# Patient Record
Sex: Male | Born: 1965 | Race: Black or African American | Hispanic: No | Marital: Married | Smoking: Former smoker
Health system: Southern US, Community
[De-identification: ages and names within clinical notes are randomized; demographics above are authoritative.]

## PROBLEM LIST (undated history)

## (undated) DIAGNOSIS — I10 Essential (primary) hypertension: Secondary | ICD-10-CM

## (undated) DIAGNOSIS — I639 Cerebral infarction, unspecified: Secondary | ICD-10-CM

## (undated) DIAGNOSIS — E119 Type 2 diabetes mellitus without complications: Secondary | ICD-10-CM

## (undated) DIAGNOSIS — G4730 Sleep apnea, unspecified: Secondary | ICD-10-CM

## (undated) DIAGNOSIS — J40 Bronchitis, not specified as acute or chronic: Secondary | ICD-10-CM

## (undated) DIAGNOSIS — L0390 Cellulitis, unspecified: Secondary | ICD-10-CM

## (undated) DIAGNOSIS — J449 Chronic obstructive pulmonary disease, unspecified: Secondary | ICD-10-CM

## (undated) HISTORY — PX: OTHER SURGICAL HISTORY: SHX169

---

## 2001-02-07 ENCOUNTER — Emergency Department (HOSPITAL_COMMUNITY): Admission: EM | Admit: 2001-02-07 | Discharge: 2001-02-07 | Payer: Self-pay | Admitting: *Deleted

## 2002-05-26 ENCOUNTER — Emergency Department (HOSPITAL_COMMUNITY): Admission: EM | Admit: 2002-05-26 | Discharge: 2002-05-26 | Payer: Self-pay | Admitting: Emergency Medicine

## 2004-04-20 ENCOUNTER — Emergency Department (HOSPITAL_COMMUNITY): Admission: EM | Admit: 2004-04-20 | Discharge: 2004-04-20 | Payer: Self-pay | Admitting: Emergency Medicine

## 2004-04-20 IMAGING — CR DG CHEST 2V
2 series · 2 of 2 positions shown · non-contrast
Comparison: none

HISTORY: Aching chest pain cough fever

CHEST 2 VIEWS:
No prior study for comparison
Normal heart size, mediastinal contours, and vascularity.
Bronchitic changes with minimal subsegmental atelectasis lower left chest.
No infiltrate, effusion, or pneumothorax.
Bones unremarkable.

[view not recorded (1 of 2)]
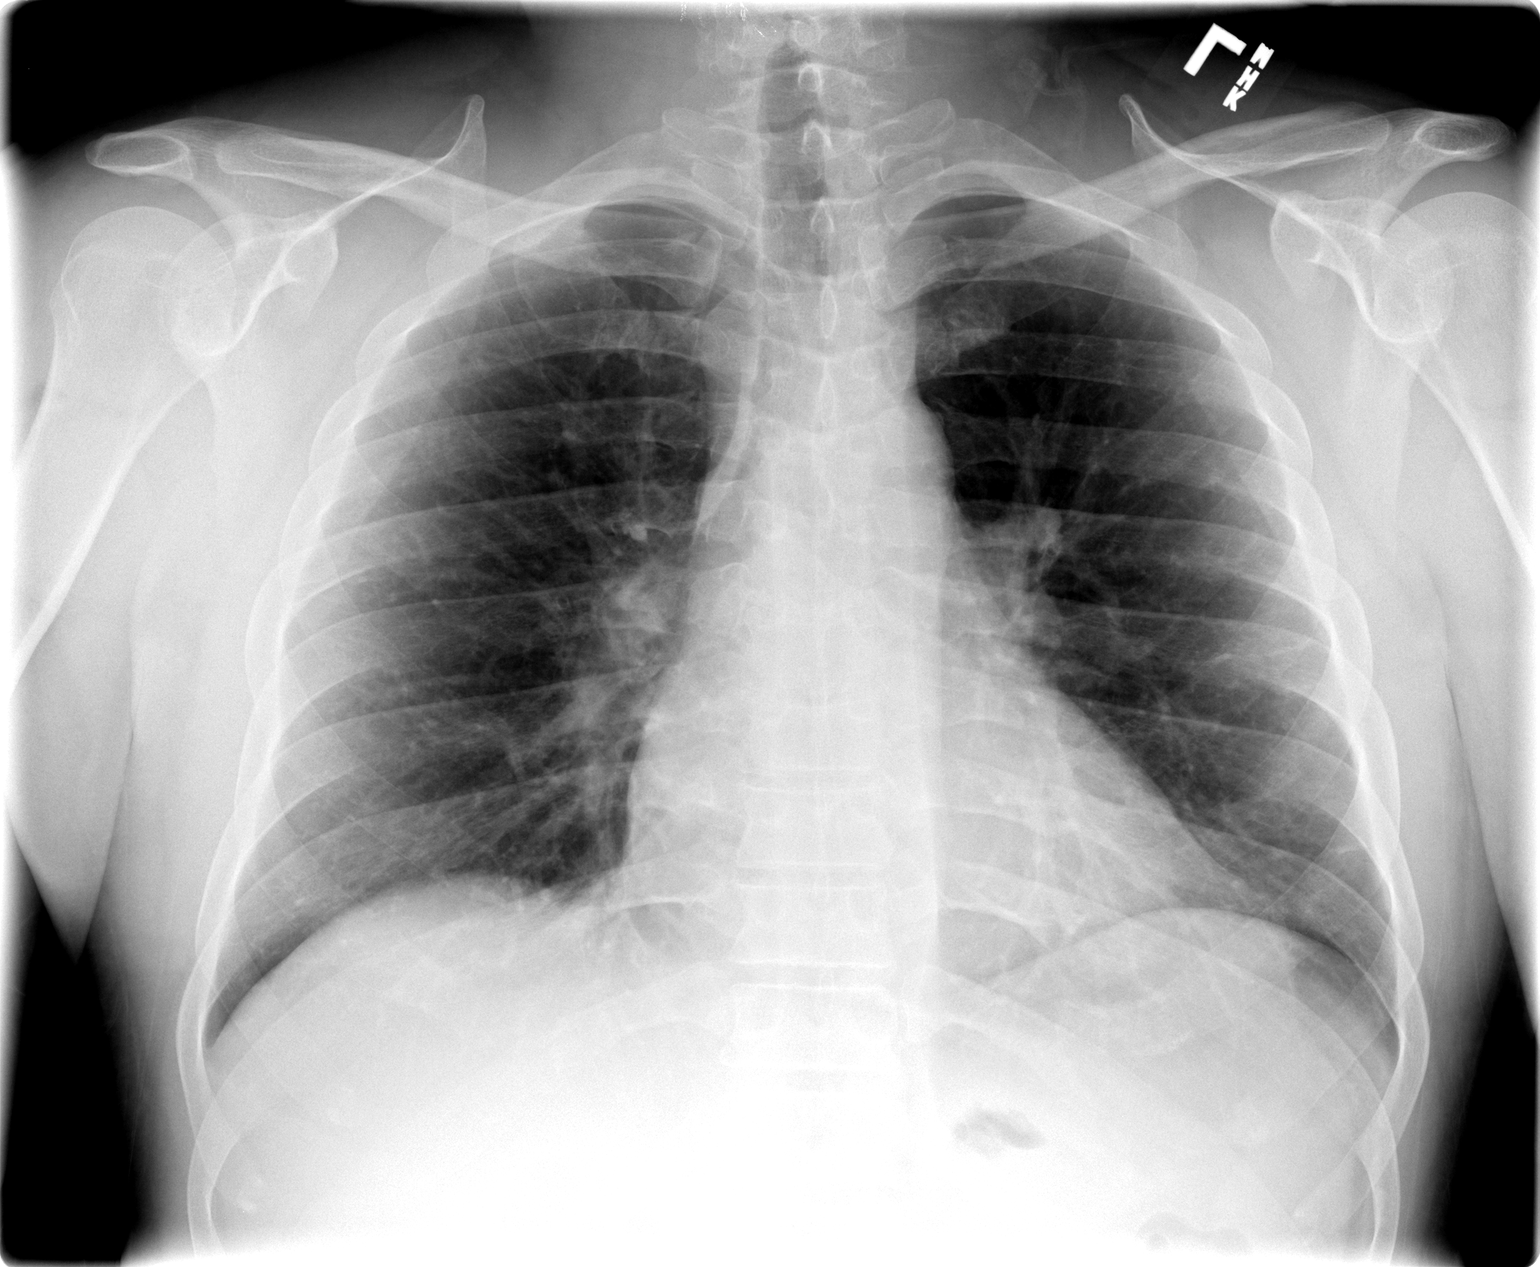

[view not recorded (2 of 2)]
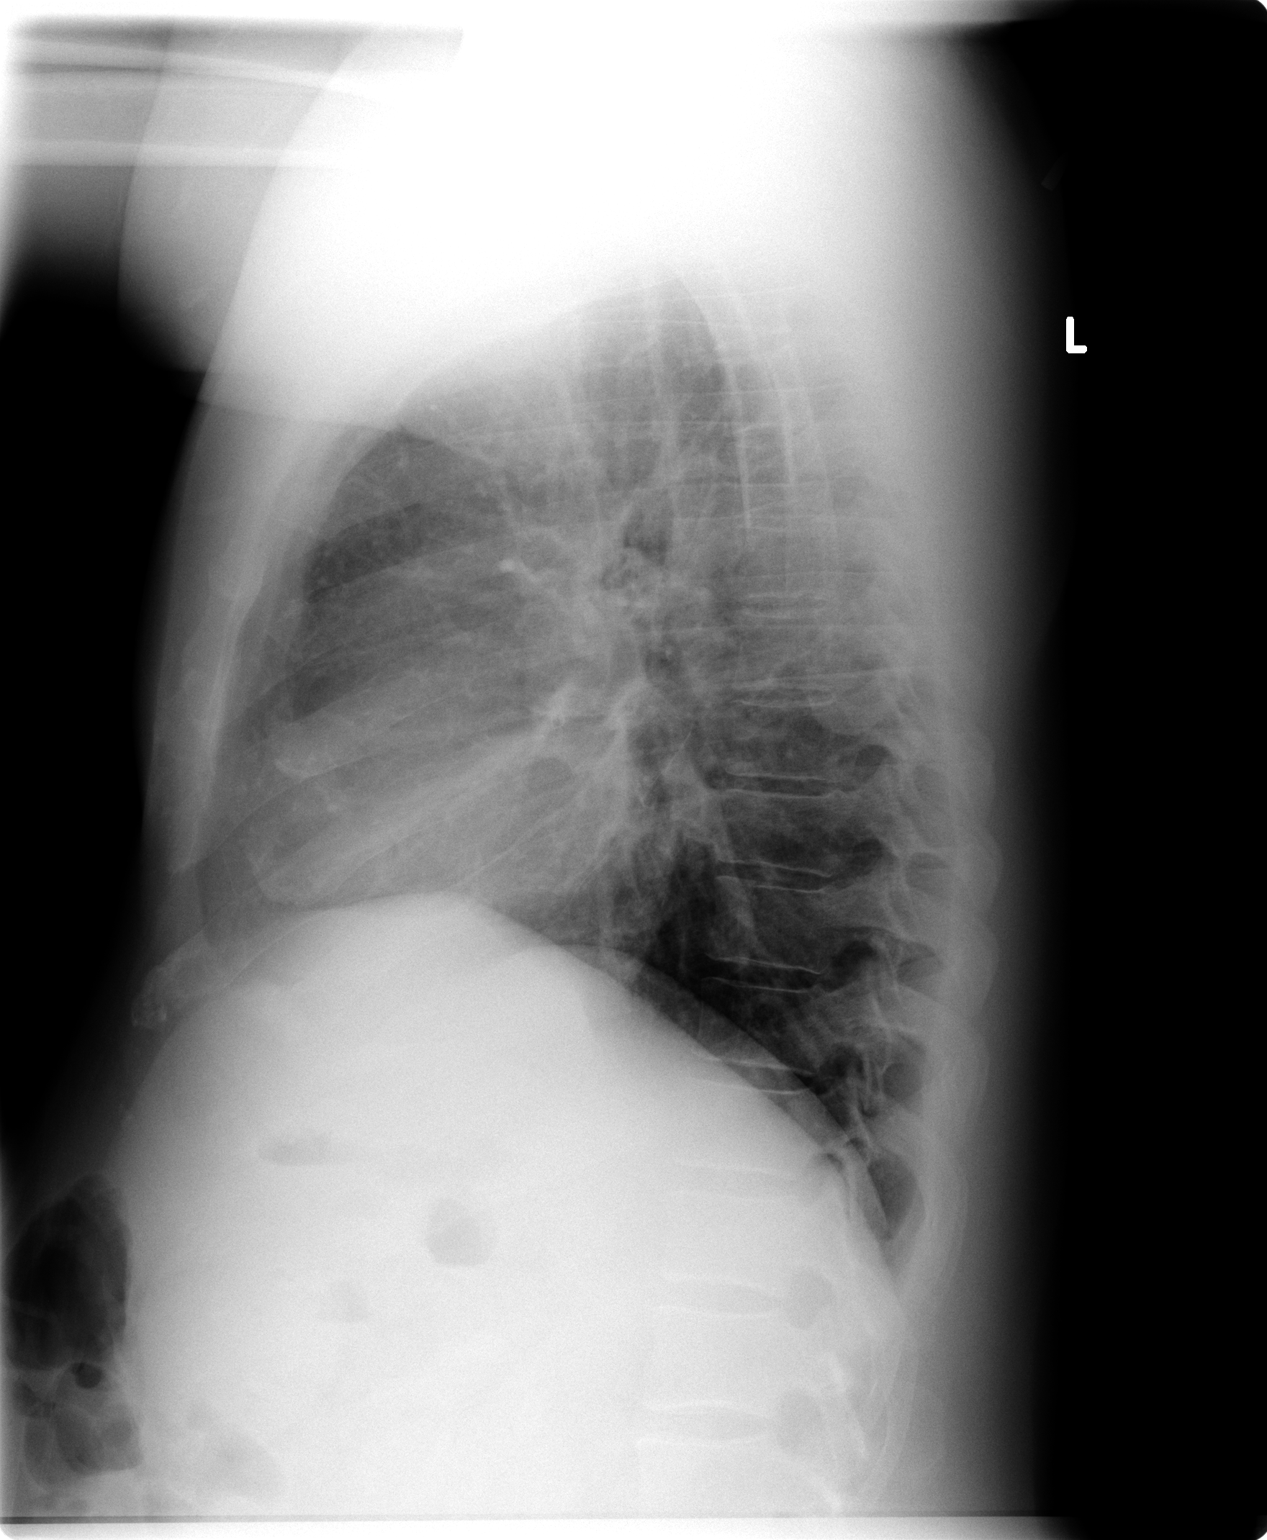

[2 of 2 positions shown; findings below may reference images not displayed]

IMPRESSION: Minimal bronchitic changes.
Minimal subsegmental atelectasis lower left chest.

## 2004-08-10 ENCOUNTER — Emergency Department (HOSPITAL_COMMUNITY): Admission: EM | Admit: 2004-08-10 | Discharge: 2004-08-10 | Payer: Self-pay | Admitting: Emergency Medicine

## 2004-11-18 ENCOUNTER — Emergency Department (HOSPITAL_COMMUNITY): Admission: EM | Admit: 2004-11-18 | Discharge: 2004-11-18 | Payer: Self-pay | Admitting: Emergency Medicine

## 2005-02-23 ENCOUNTER — Emergency Department (HOSPITAL_COMMUNITY): Admission: EM | Admit: 2005-02-23 | Discharge: 2005-02-23 | Payer: Self-pay | Admitting: Emergency Medicine

## 2008-03-30 ENCOUNTER — Emergency Department (HOSPITAL_COMMUNITY): Admission: EM | Admit: 2008-03-30 | Discharge: 2008-03-30 | Payer: Self-pay | Admitting: Emergency Medicine

## 2008-09-18 ENCOUNTER — Emergency Department (HOSPITAL_COMMUNITY): Admission: EM | Admit: 2008-09-18 | Discharge: 2008-09-18 | Payer: Self-pay | Admitting: Emergency Medicine

## 2008-09-18 IMAGING — CR DG ANKLE COMPLETE 3+V*R*
3 series · 3 of 3 positions shown · non-contrast
Comparison: None.

CLINICAL DATA: Right ankle laceration, trauma, pain .

RIGHT ANKLE - COMPLETE 3+ VIEW

[t ankle joint ap right]
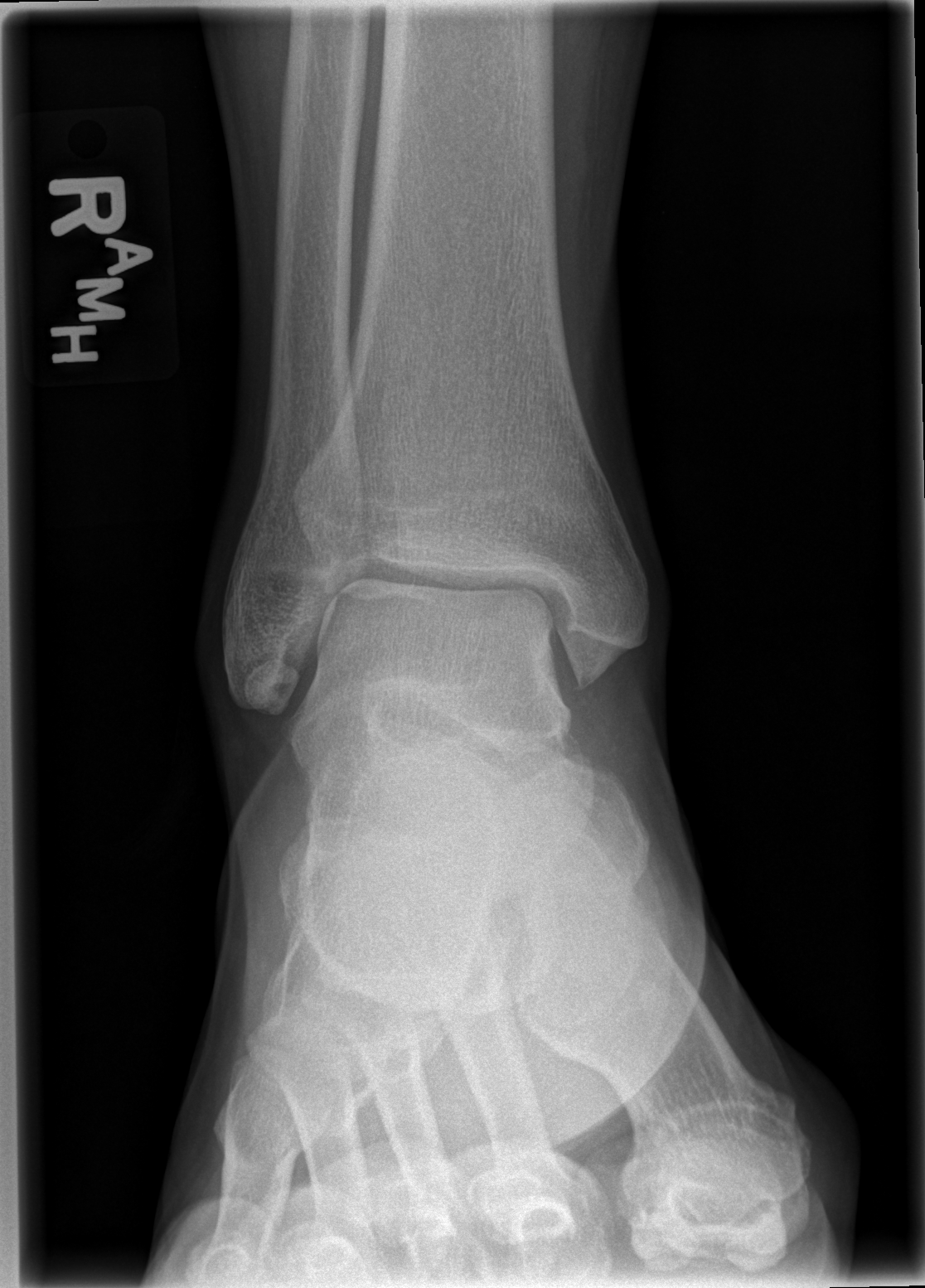

[t ankle joint oblique right]
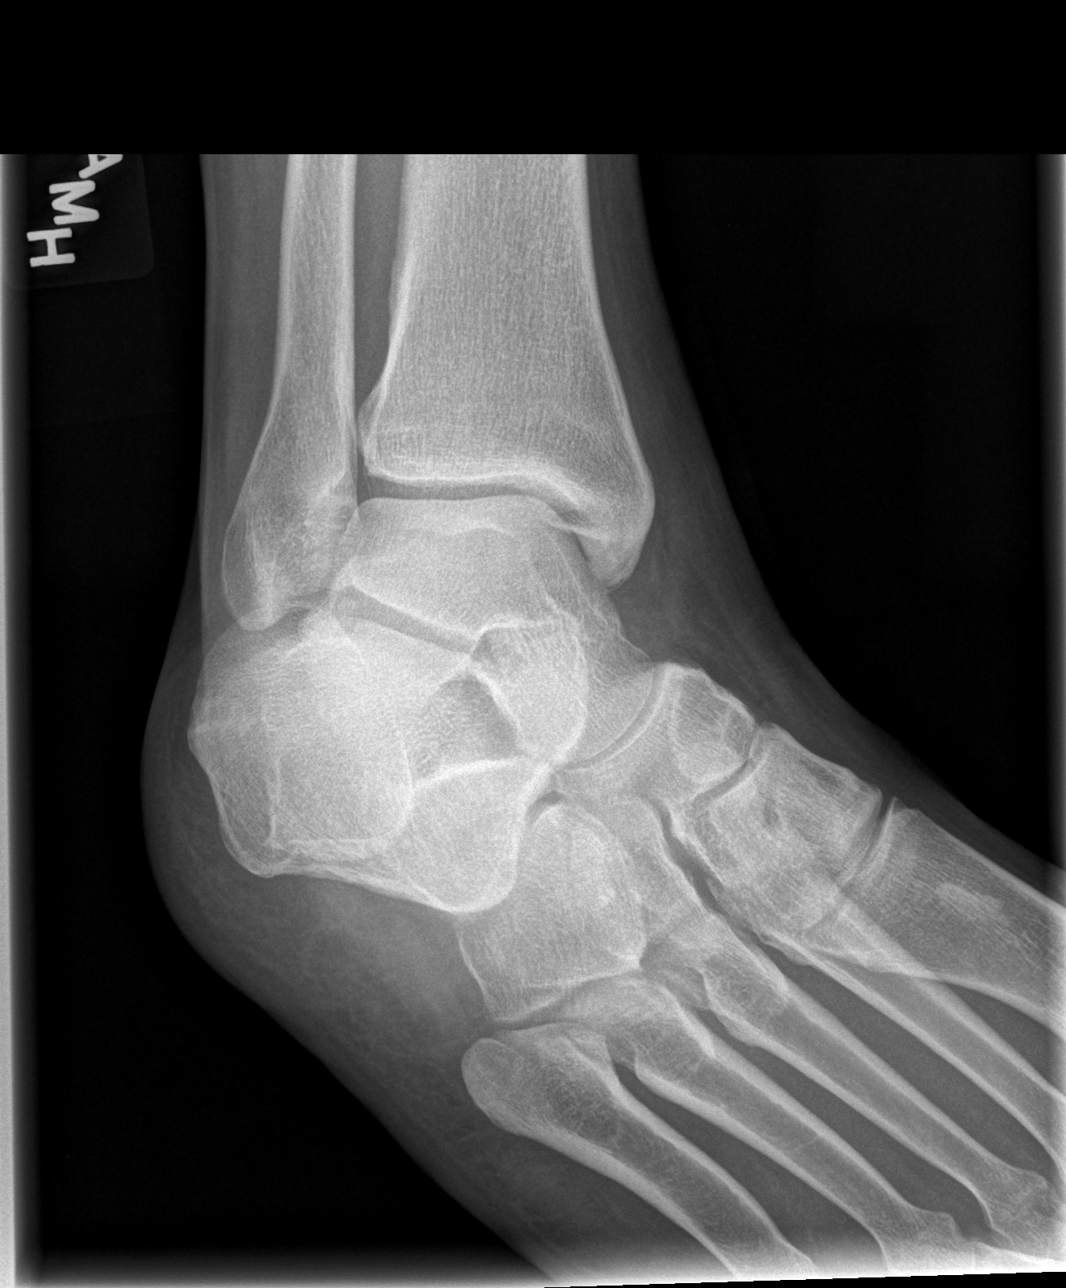

[t ankle joint lat right]
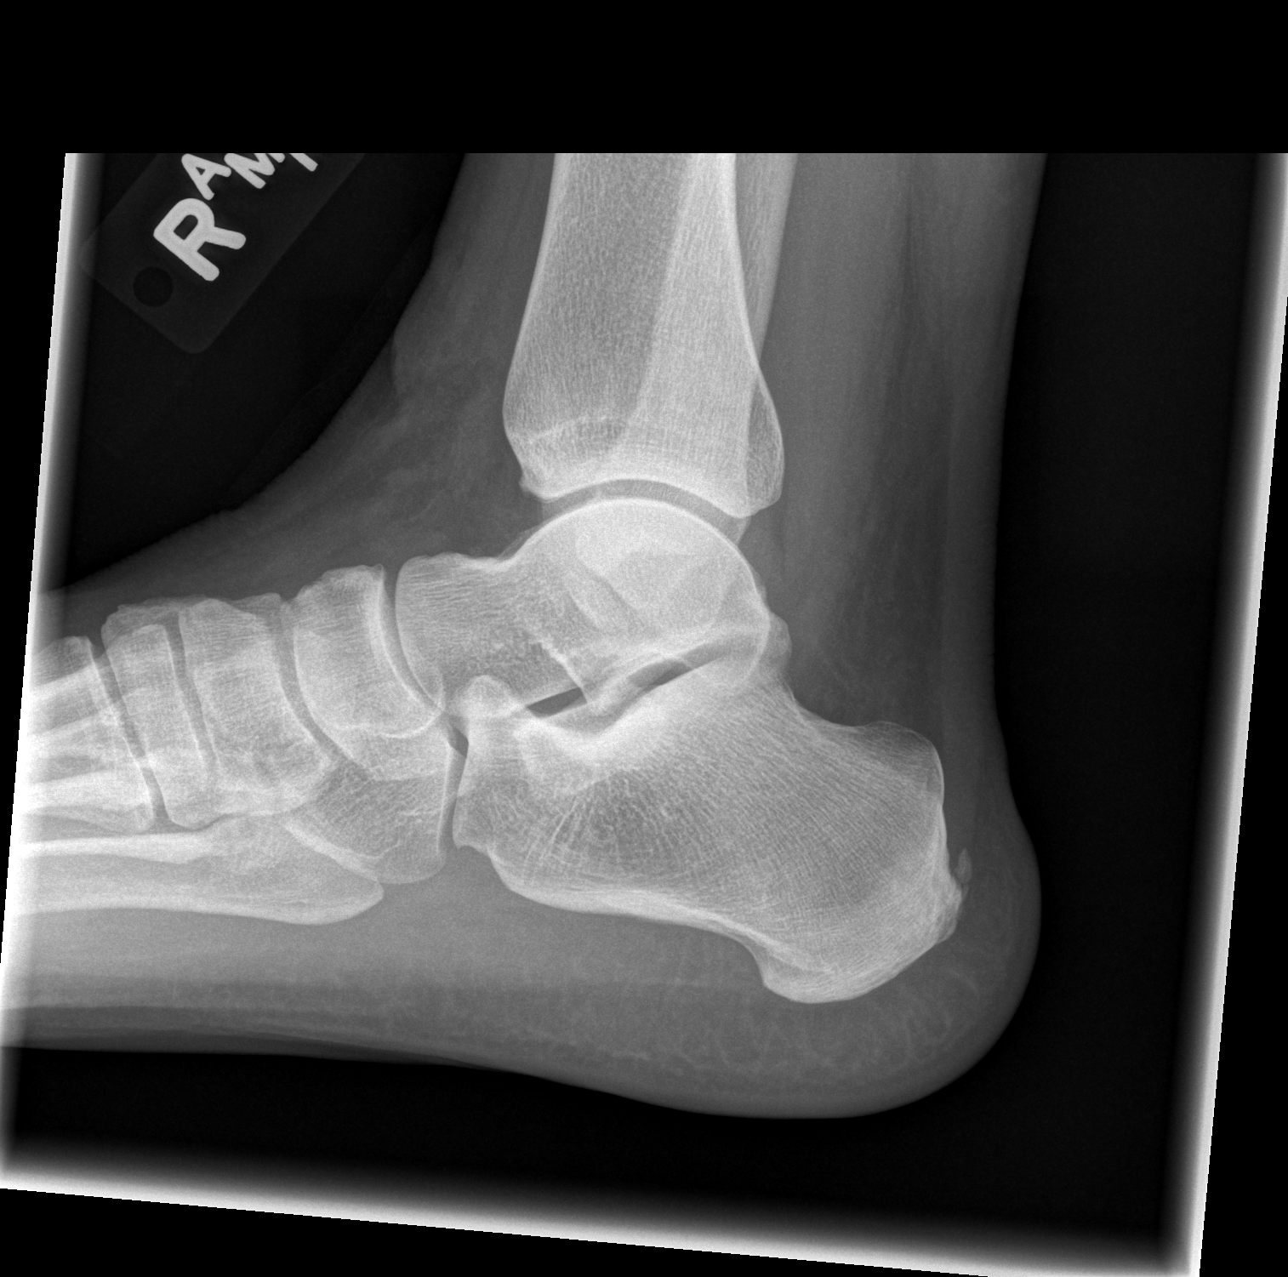

[3 of 3 positions shown; findings below may reference images not displayed]

FINDINGS: Normal alignment.  No acute fracture or definite
effusion.  Secondary ossicle noted along the lateral malleolus.
Intact malleoli, talus and calcaneus
IMPRESSION: No acute finding.

## 2008-12-06 ENCOUNTER — Emergency Department (HOSPITAL_COMMUNITY): Admission: EM | Admit: 2008-12-06 | Discharge: 2008-12-06 | Payer: Self-pay | Admitting: Emergency Medicine

## 2009-04-26 ENCOUNTER — Emergency Department (HOSPITAL_COMMUNITY): Admission: EM | Admit: 2009-04-26 | Discharge: 2009-04-26 | Payer: Self-pay | Admitting: Emergency Medicine

## 2009-04-26 IMAGING — CT CT HEAD W/O CM
2 of 3 series · 15 of 30 positions shown, 19 images · non-contrast
Comparison: None.
COMPARISON: None.

CLINICAL DATA: Headache.  Bilateral ear pain.

CT HEAD WITHOUT CONTRAST
TECHNIQUE: Contiguous axial images were obtained from the base of
the skull through the vertex without intravenous contrast.
CLINICAL DATA: Bilateral ear pain.  Headache.
CT LIMITED SINUSES WITHOUT CONTRAST
TECHNIQUE: Multidetector CT images of the paranasal sinuses were
obtained in a single plane without contrast.

[Series 2: headseq 4.8 h37s · axial · 0.46mm/px · z∈[+167,+324]mm · 14 of 36 slices shown, 18 images]
[im 3/36  brain]
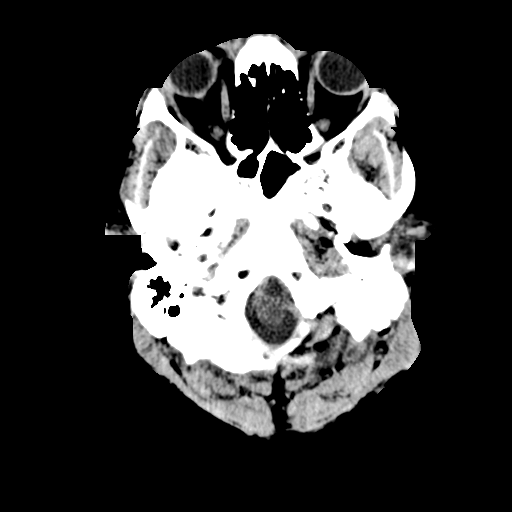
[im 3/36  bone]
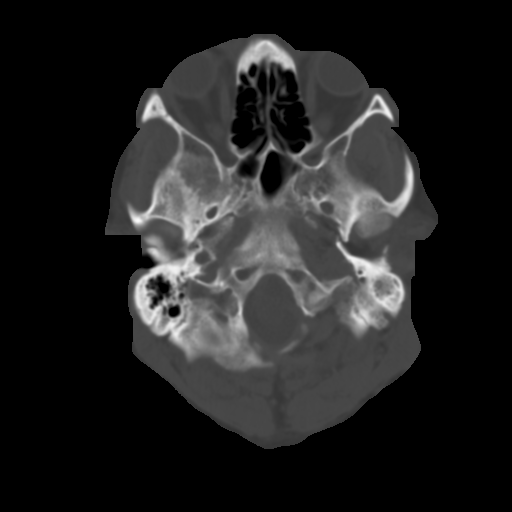
[im 5/36  brain]
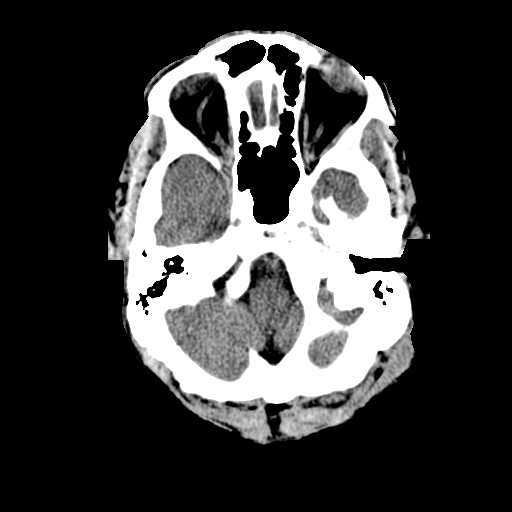
[im 8/36  brain]
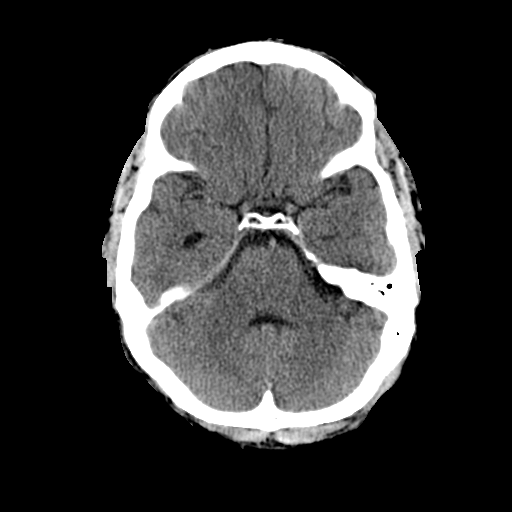
[im 10/36  brain]
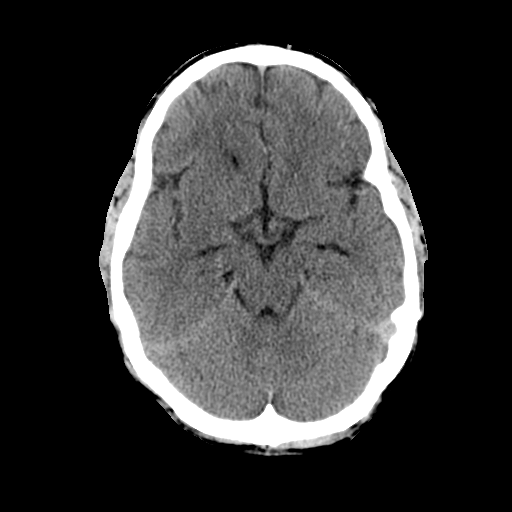
[im 12/36  brain]
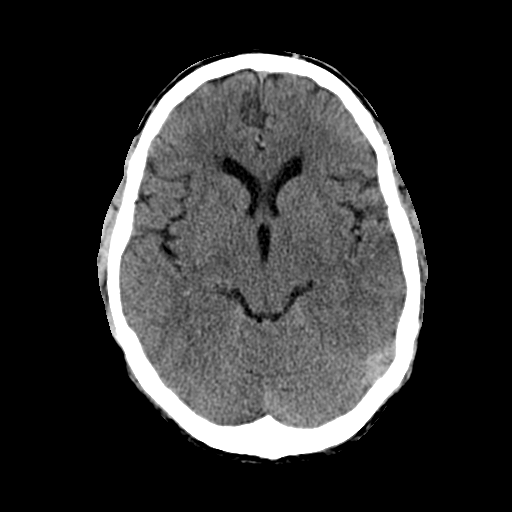
[im 12/36  bone]
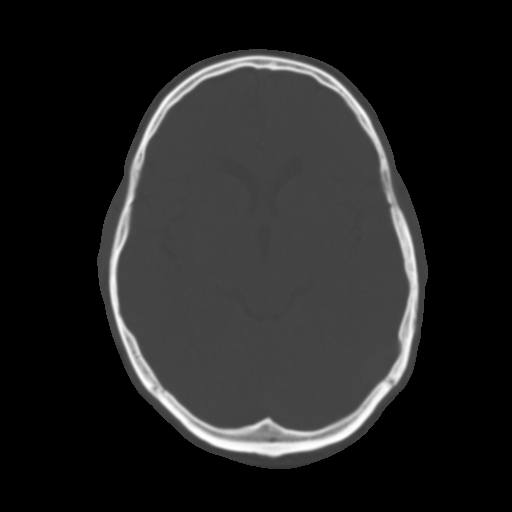
[im 15/36  brain]
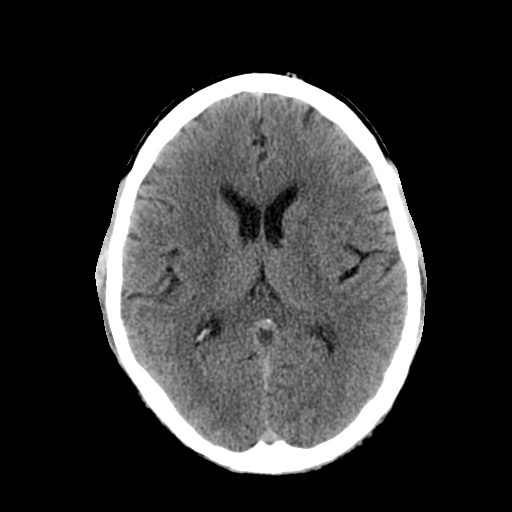
[im 17/36  brain]
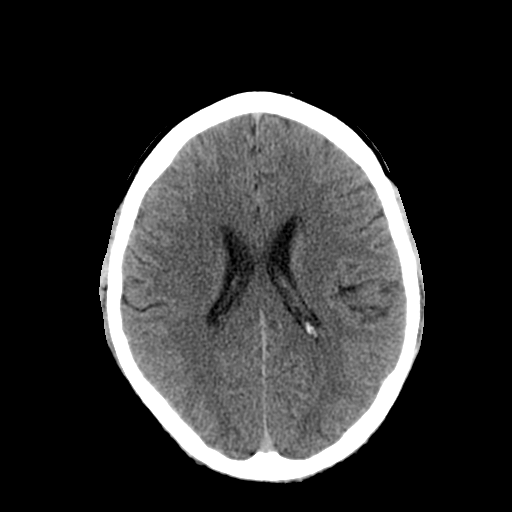
[im 19/36  brain]
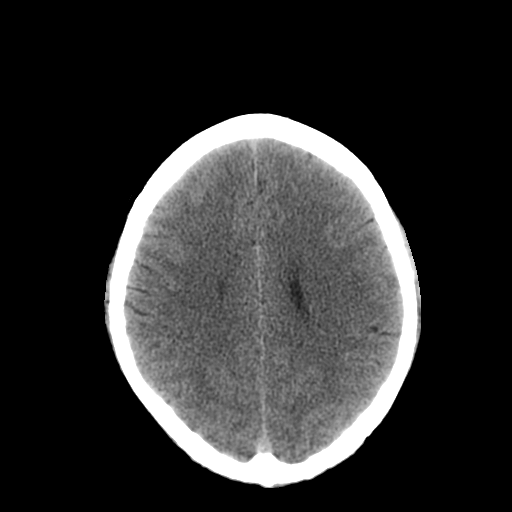
[im 22/36  brain]
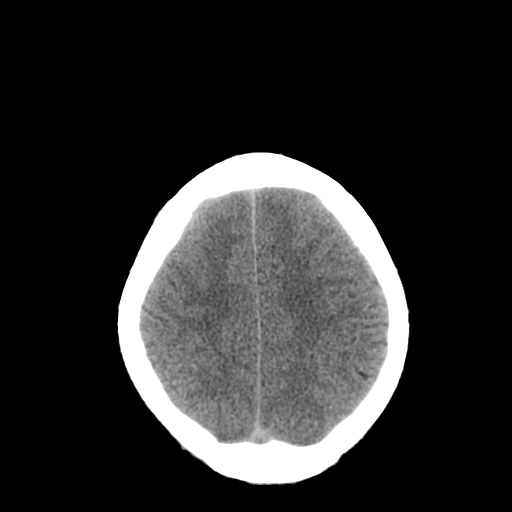
[im 22/36  bone]
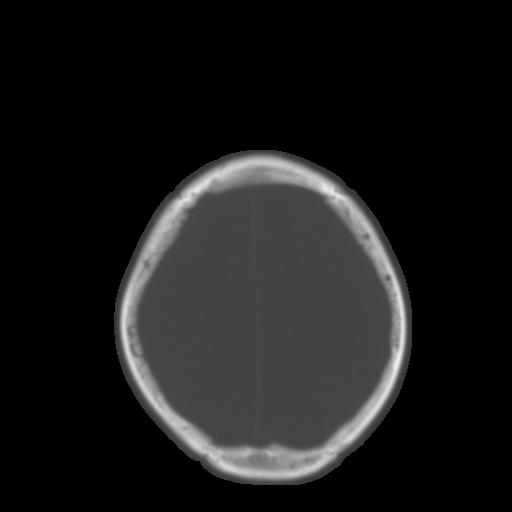
[im 24/36  brain]
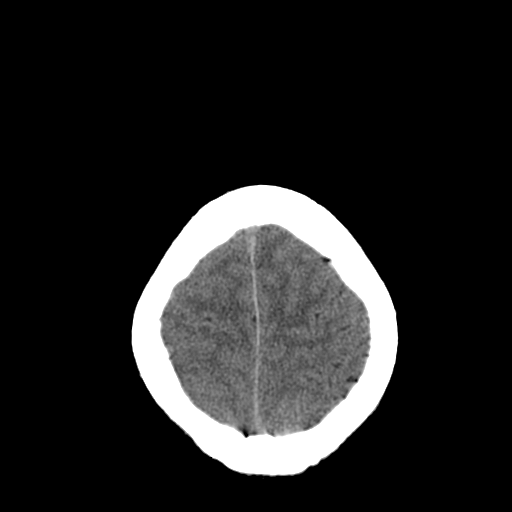
[im 26/36  brain]
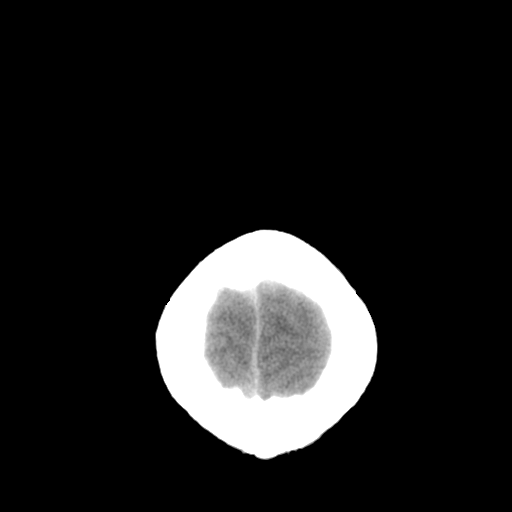
[im 29/36  brain]
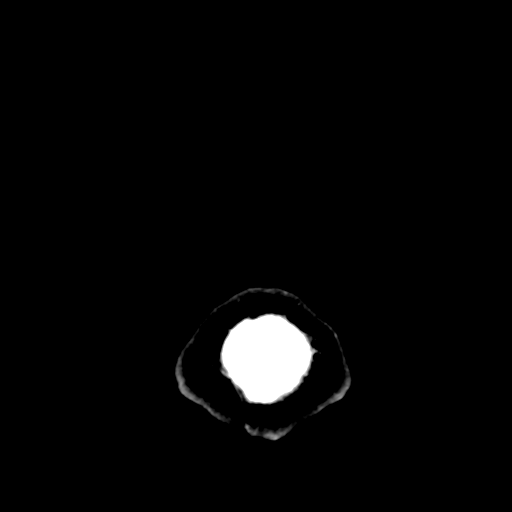
[im 31/36  brain]
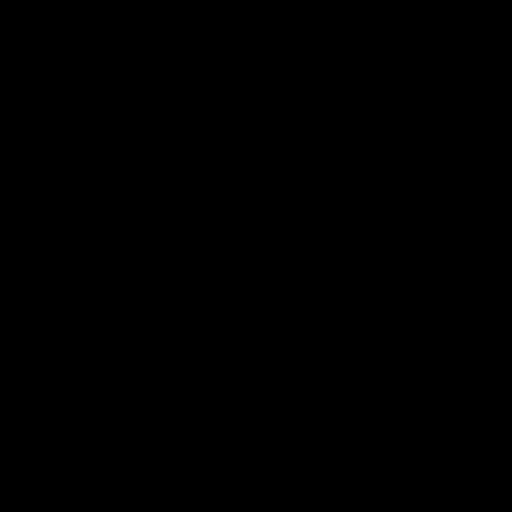
[im 31/36  bone]
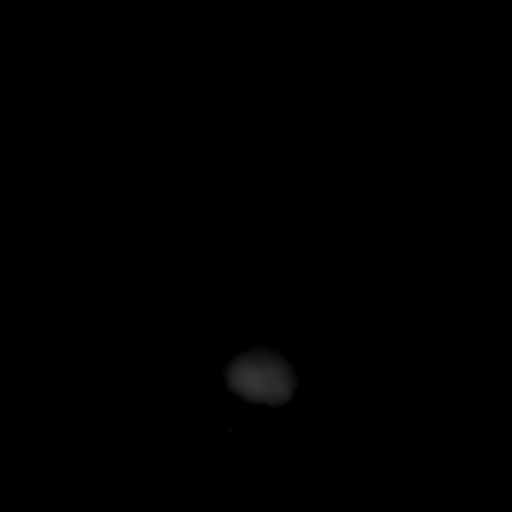
[im 33/36  brain]
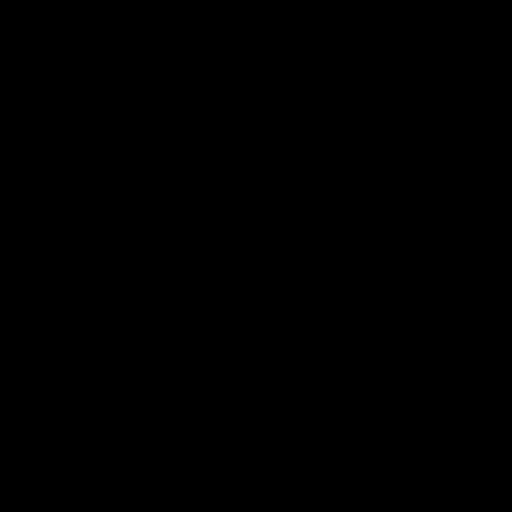

[Series 7: sinusprone 5.0 h70h · axial · 0.26mm/px · 1 of 14 slices shown]
[im 3/14  brain]
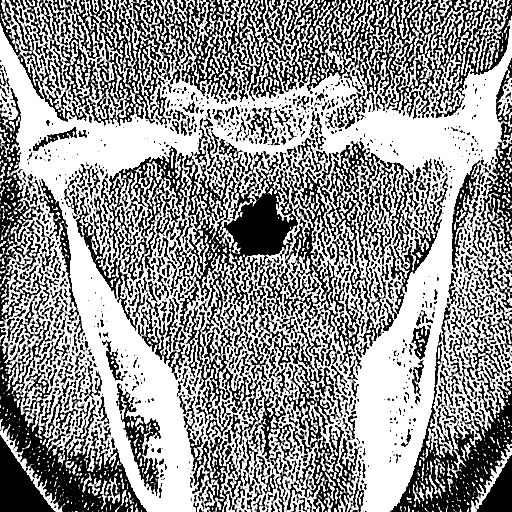

[15 of 30 positions shown; findings below may reference images not displayed]

FINDINGS: The brain appears normal without evidence of acute
infarction, hemorrhage, mass lesion, mass effect, midline shift or
abnormal extra-axial fluid collection.  No hydrocephalus.
Calvarium intact.
IMPRESSION: Negative head CT.
FINDINGS: The paranasal sinuses are clear.  Imaged osseous
structures appear normal.  Visualized soft tissue structures also
appear normal.
IMPRESSION: Negative exam.

## 2010-01-29 ENCOUNTER — Emergency Department (HOSPITAL_COMMUNITY)
Admission: EM | Admit: 2010-01-29 | Discharge: 2010-01-29 | Payer: Self-pay | Source: Home / Self Care | Admitting: Emergency Medicine

## 2010-03-07 ENCOUNTER — Emergency Department (HOSPITAL_COMMUNITY)
Admission: EM | Admit: 2010-03-07 | Discharge: 2010-03-07 | Payer: Self-pay | Source: Home / Self Care | Admitting: Emergency Medicine

## 2010-03-07 IMAGING — CR DG CHEST 2V
2 series · 2 of 2 positions shown · non-contrast
Comparison: [DATE]

CLINICAL DATA: Cough/short of breath

CHEST - 2 VIEW

[view not recorded (1 of 2)]
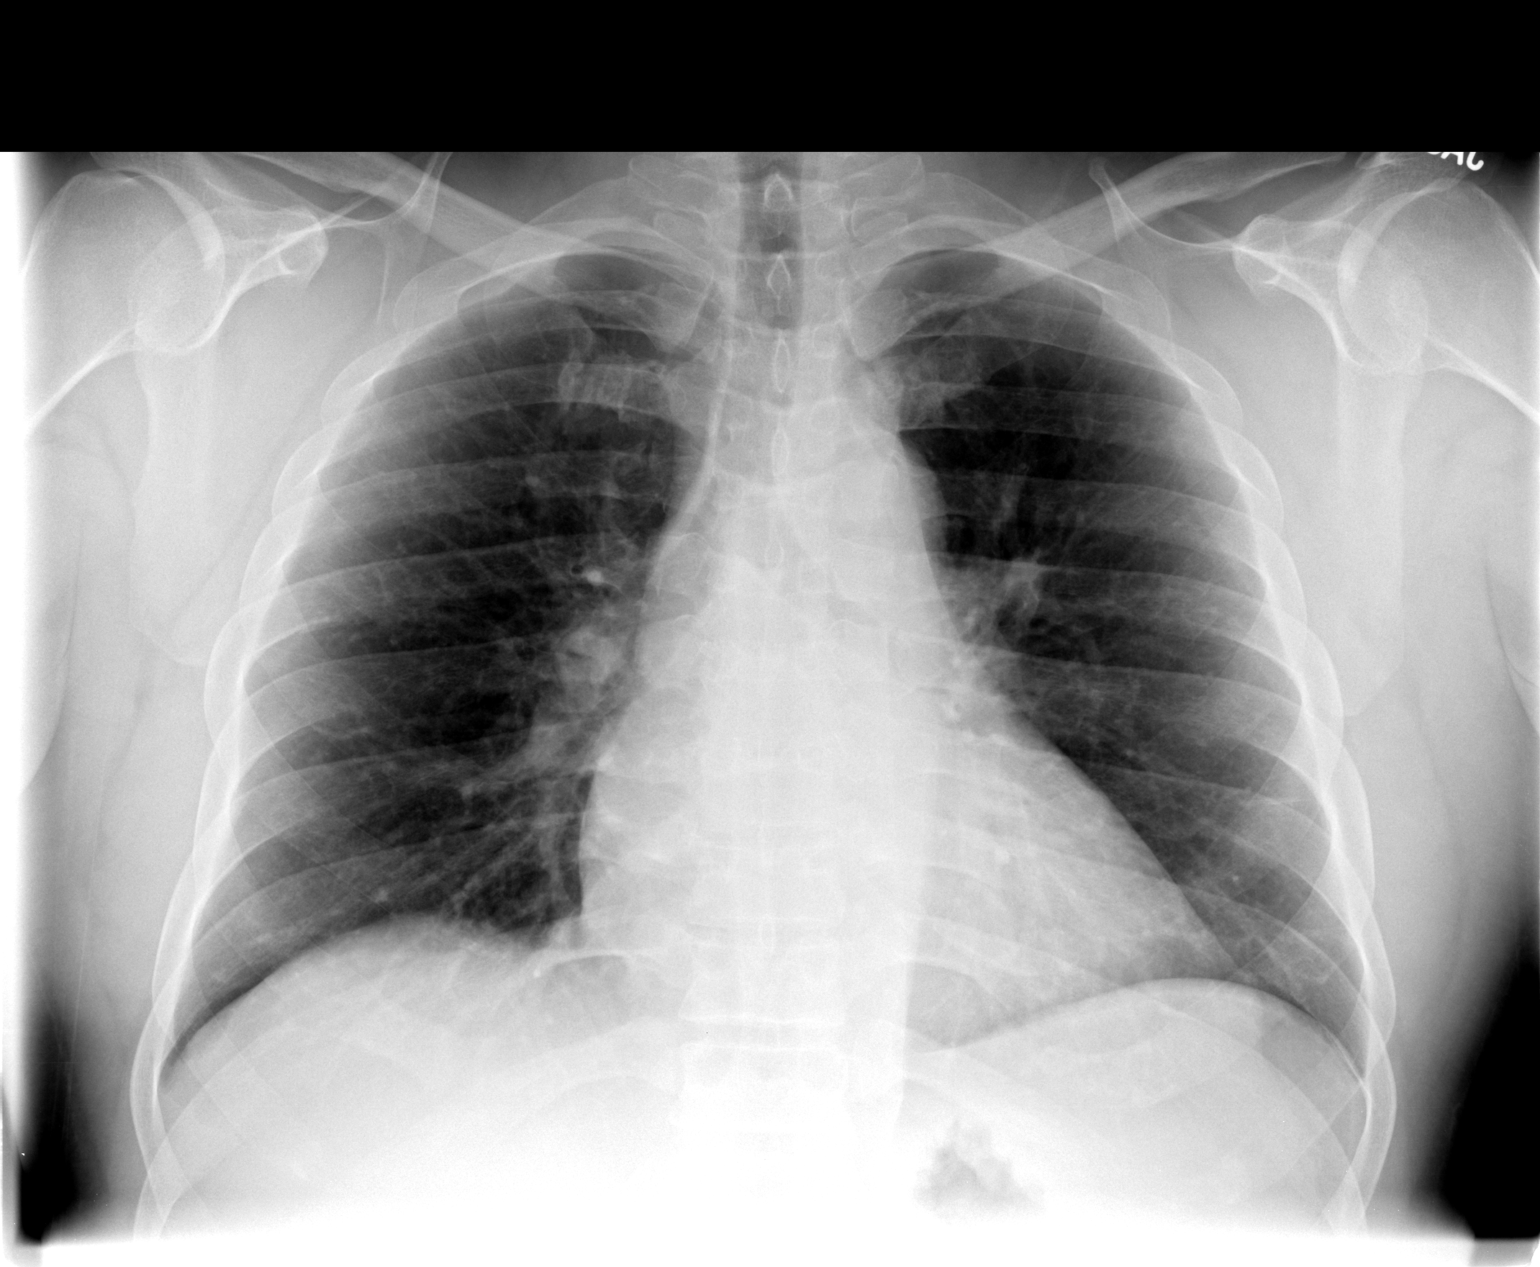

[view not recorded (2 of 2)]
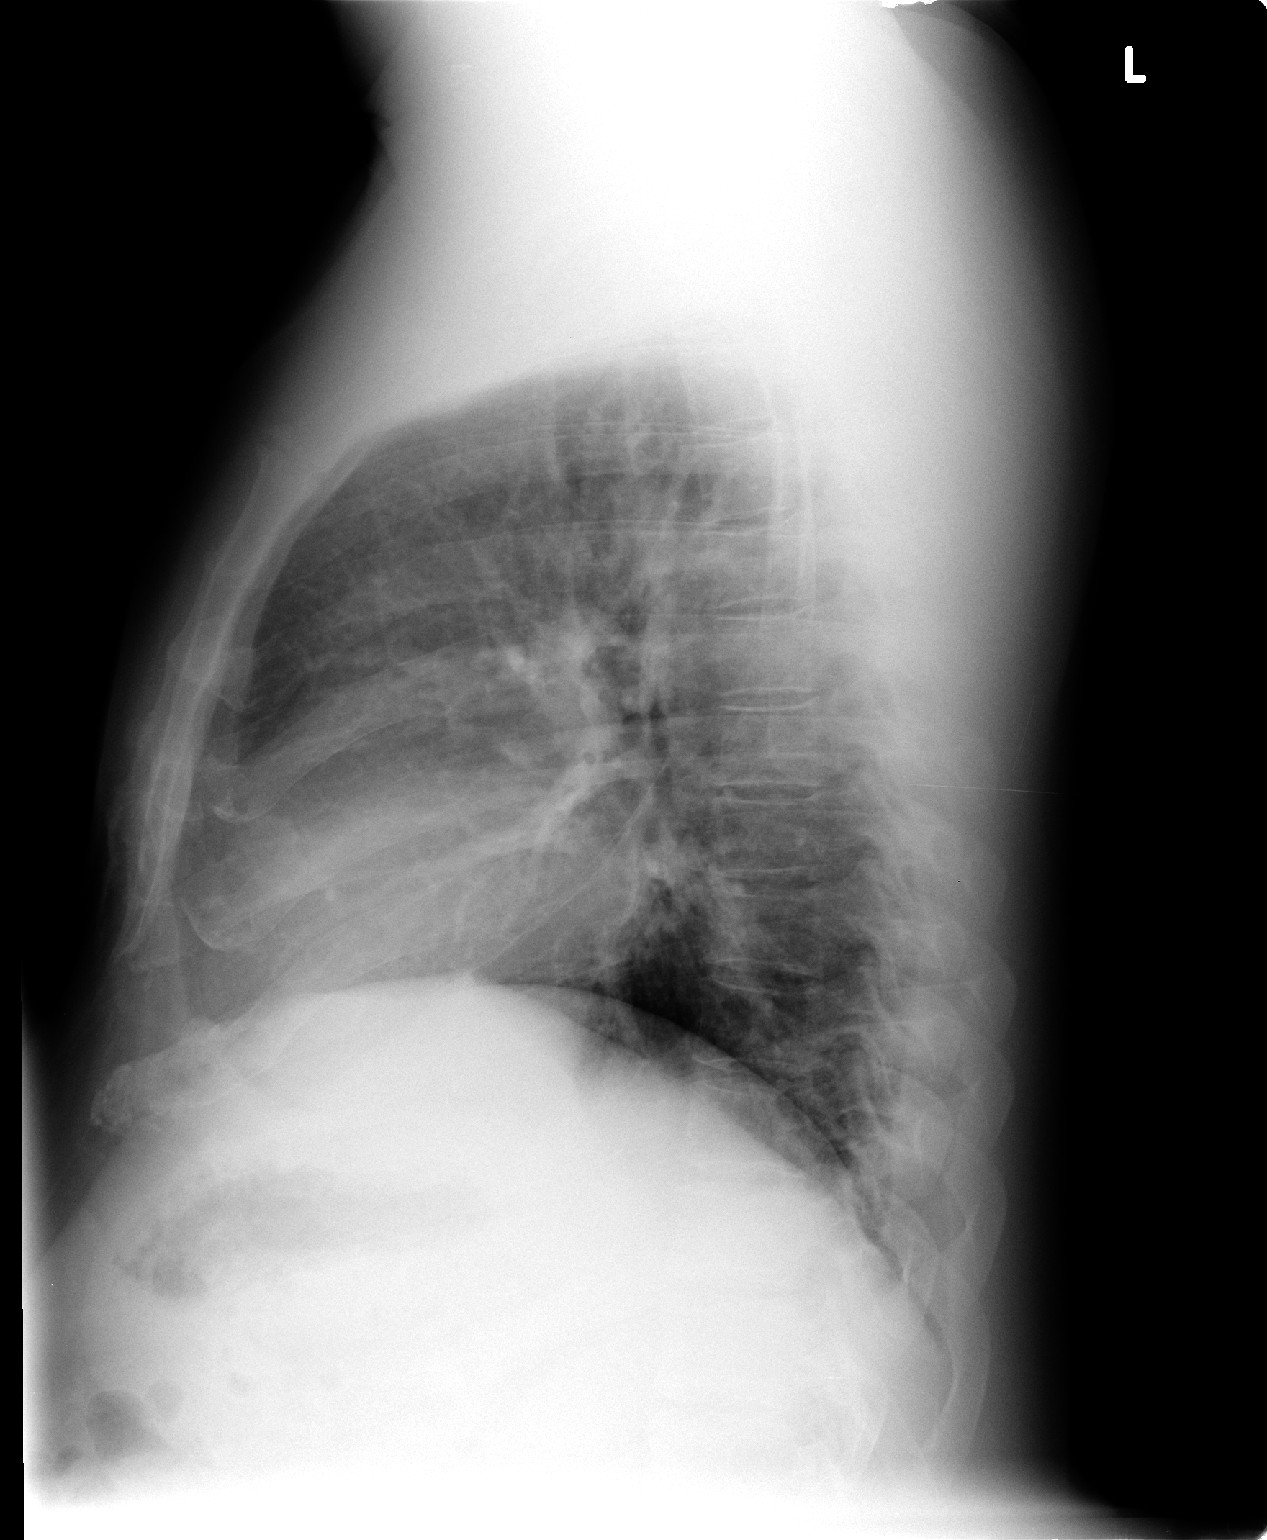

[2 of 2 positions shown; findings below may reference images not displayed]

FINDINGS: Heart borderline enlarged.  No congestive heart failure
or active disease.  No pleural fluid or osseous lesions.
IMPRESSION: Heart borderline enlarged - no congestive heart failure or active
disease.

## 2010-03-07 IMAGING — CR DG PELVIS 1-2V
1 series · 1 of 1 positions shown · non-contrast
Comparison: None.

CLINICAL DATA: Bilateral groin pain, right greater than left.

PELVIS - 1-2 VIEW

[view not recorded]
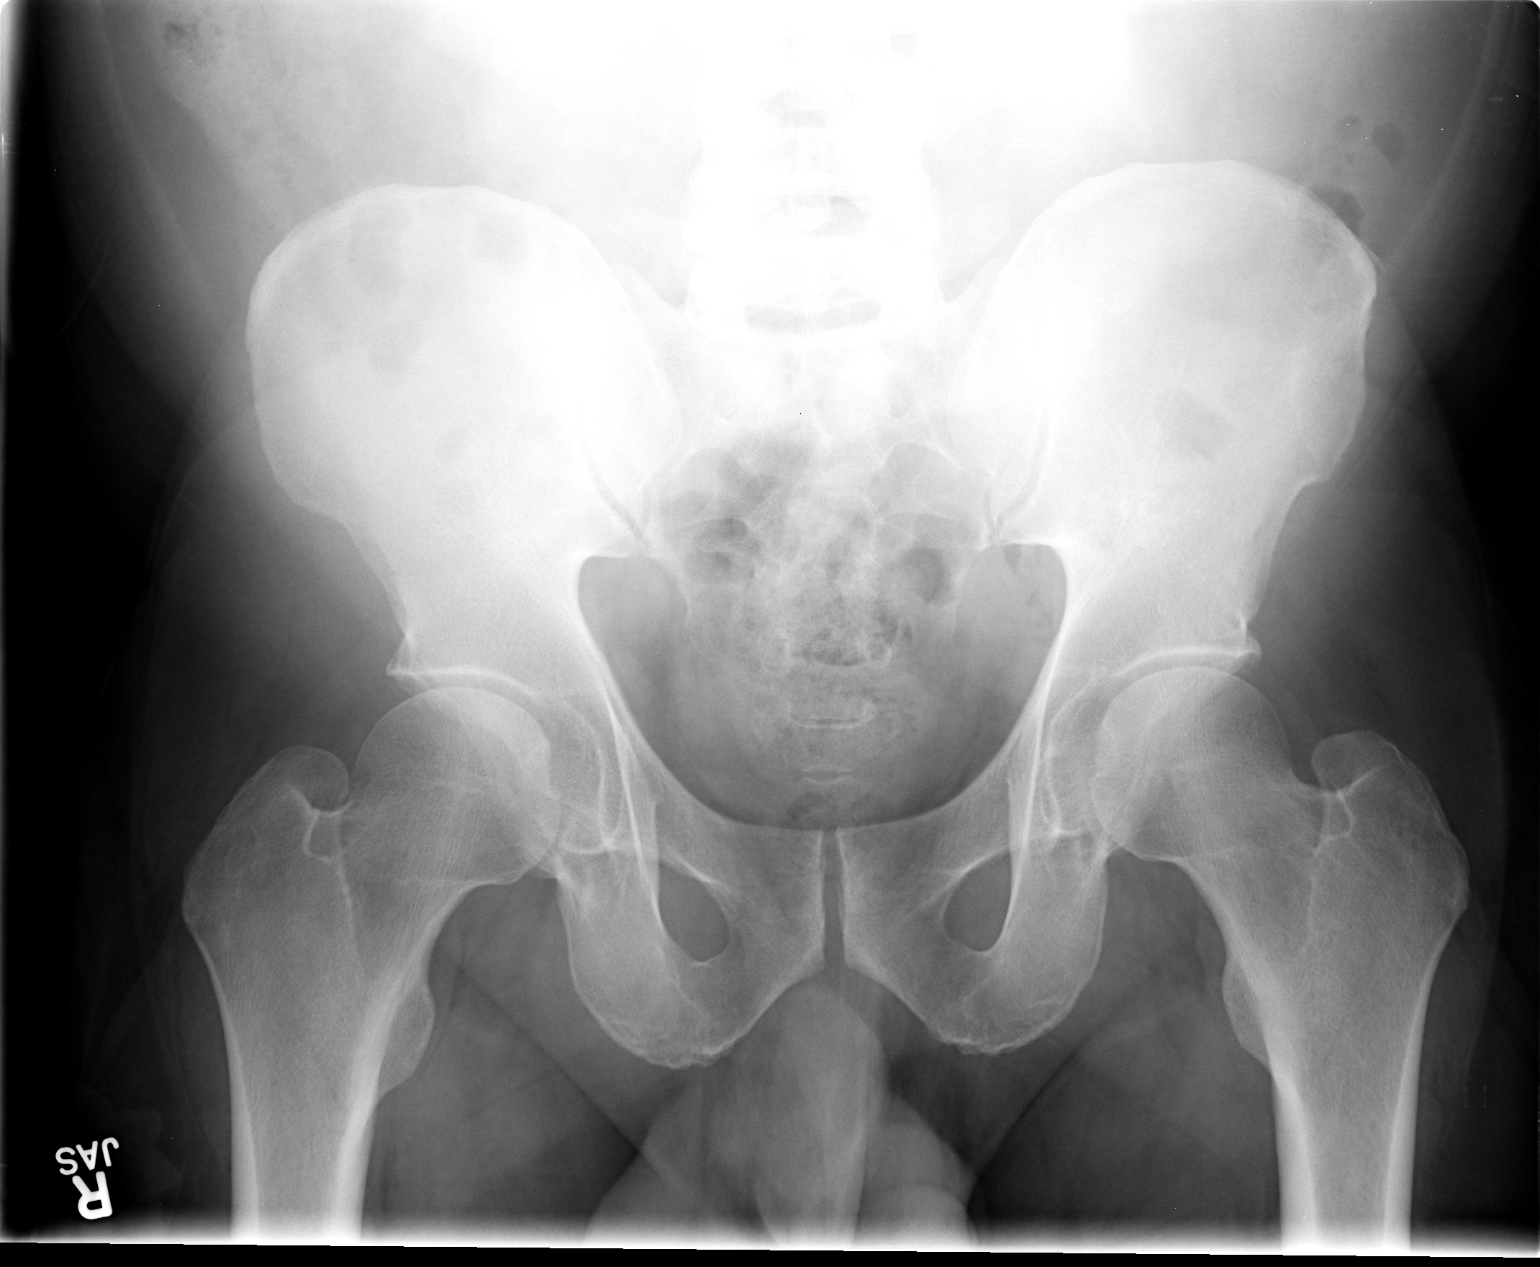

[1 of 1 positions shown; findings below may reference images not displayed]

FINDINGS: The bony structures of the pelvis appear normal.  Bowel
gas pattern is normal.
IMPRESSION: Normal exam.

## 2010-03-23 ENCOUNTER — Emergency Department (HOSPITAL_COMMUNITY)
Admission: EM | Admit: 2010-03-23 | Discharge: 2010-03-23 | Payer: Self-pay | Source: Home / Self Care | Admitting: Emergency Medicine

## 2010-03-23 IMAGING — CR DG CHEST 2V
2 series · 2 of 2 positions shown · non-contrast
Comparison: [DATE]

CLINICAL DATA: Cough and chest pain.

CHEST - 2 VIEW

[view not recorded (1 of 2)]
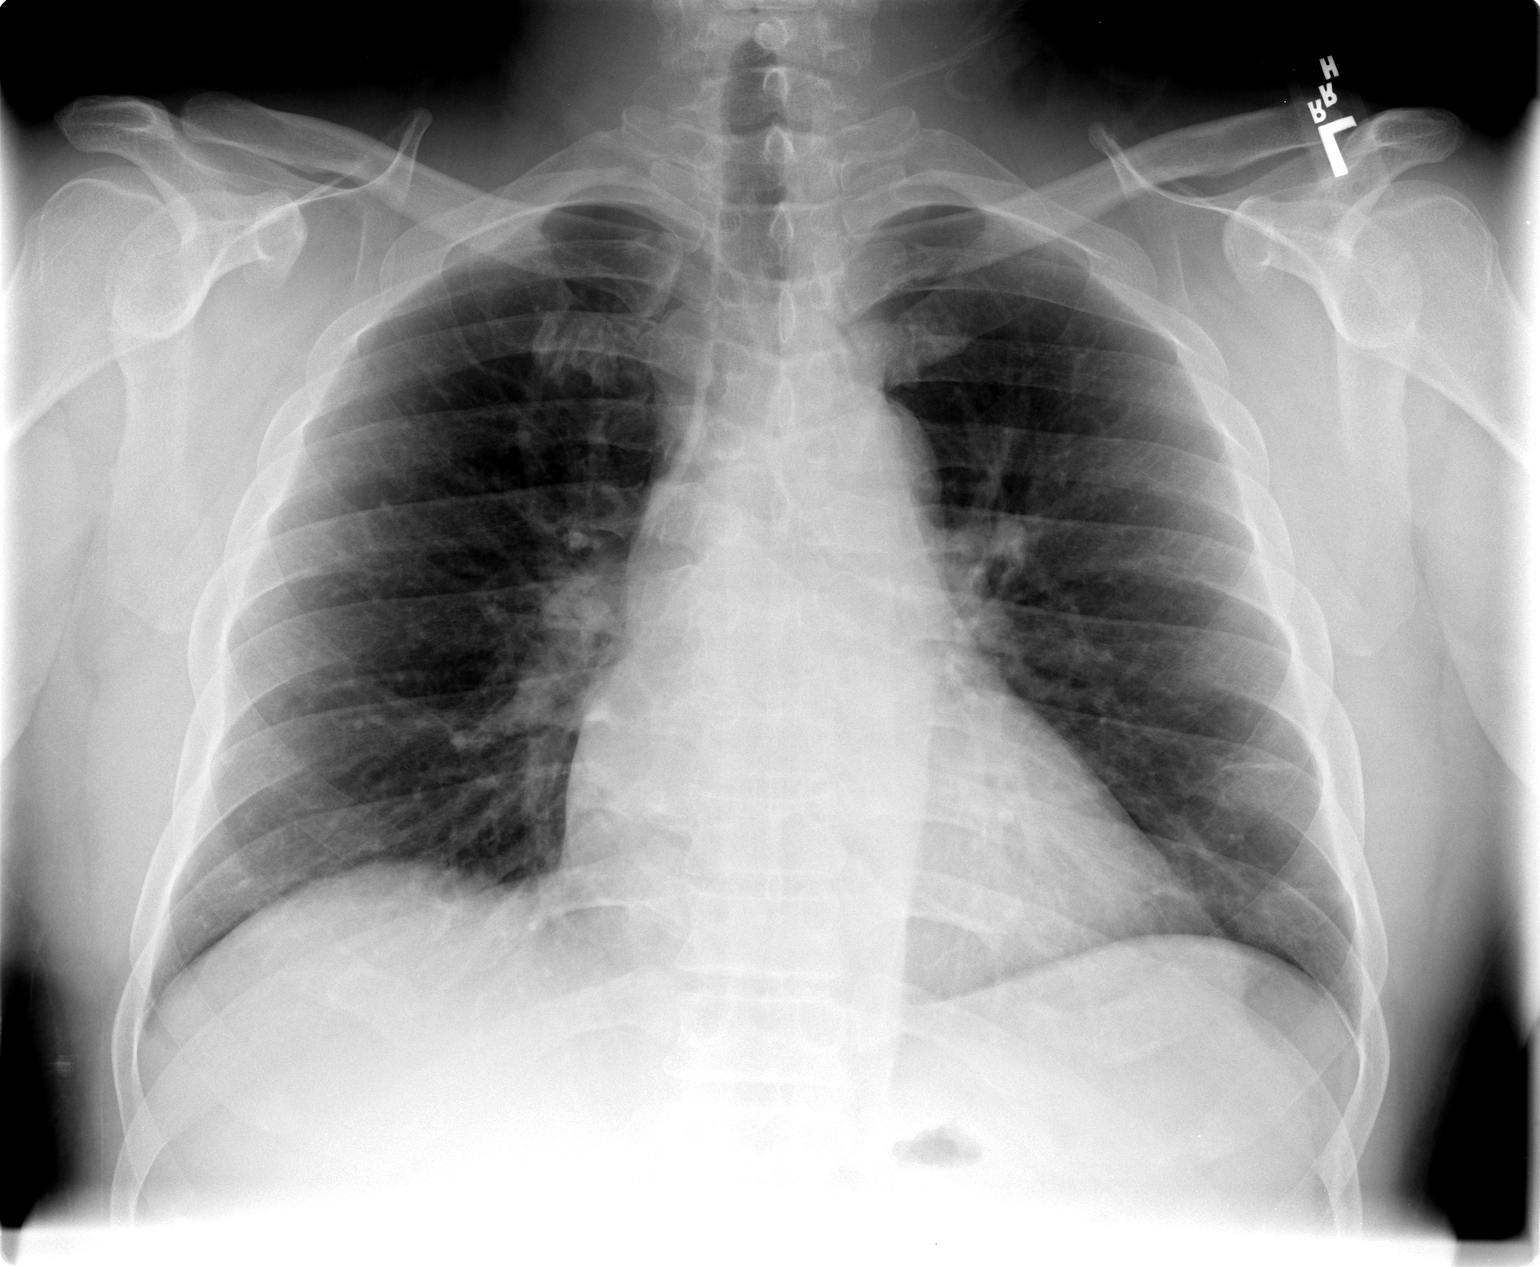

[view not recorded (2 of 2)]
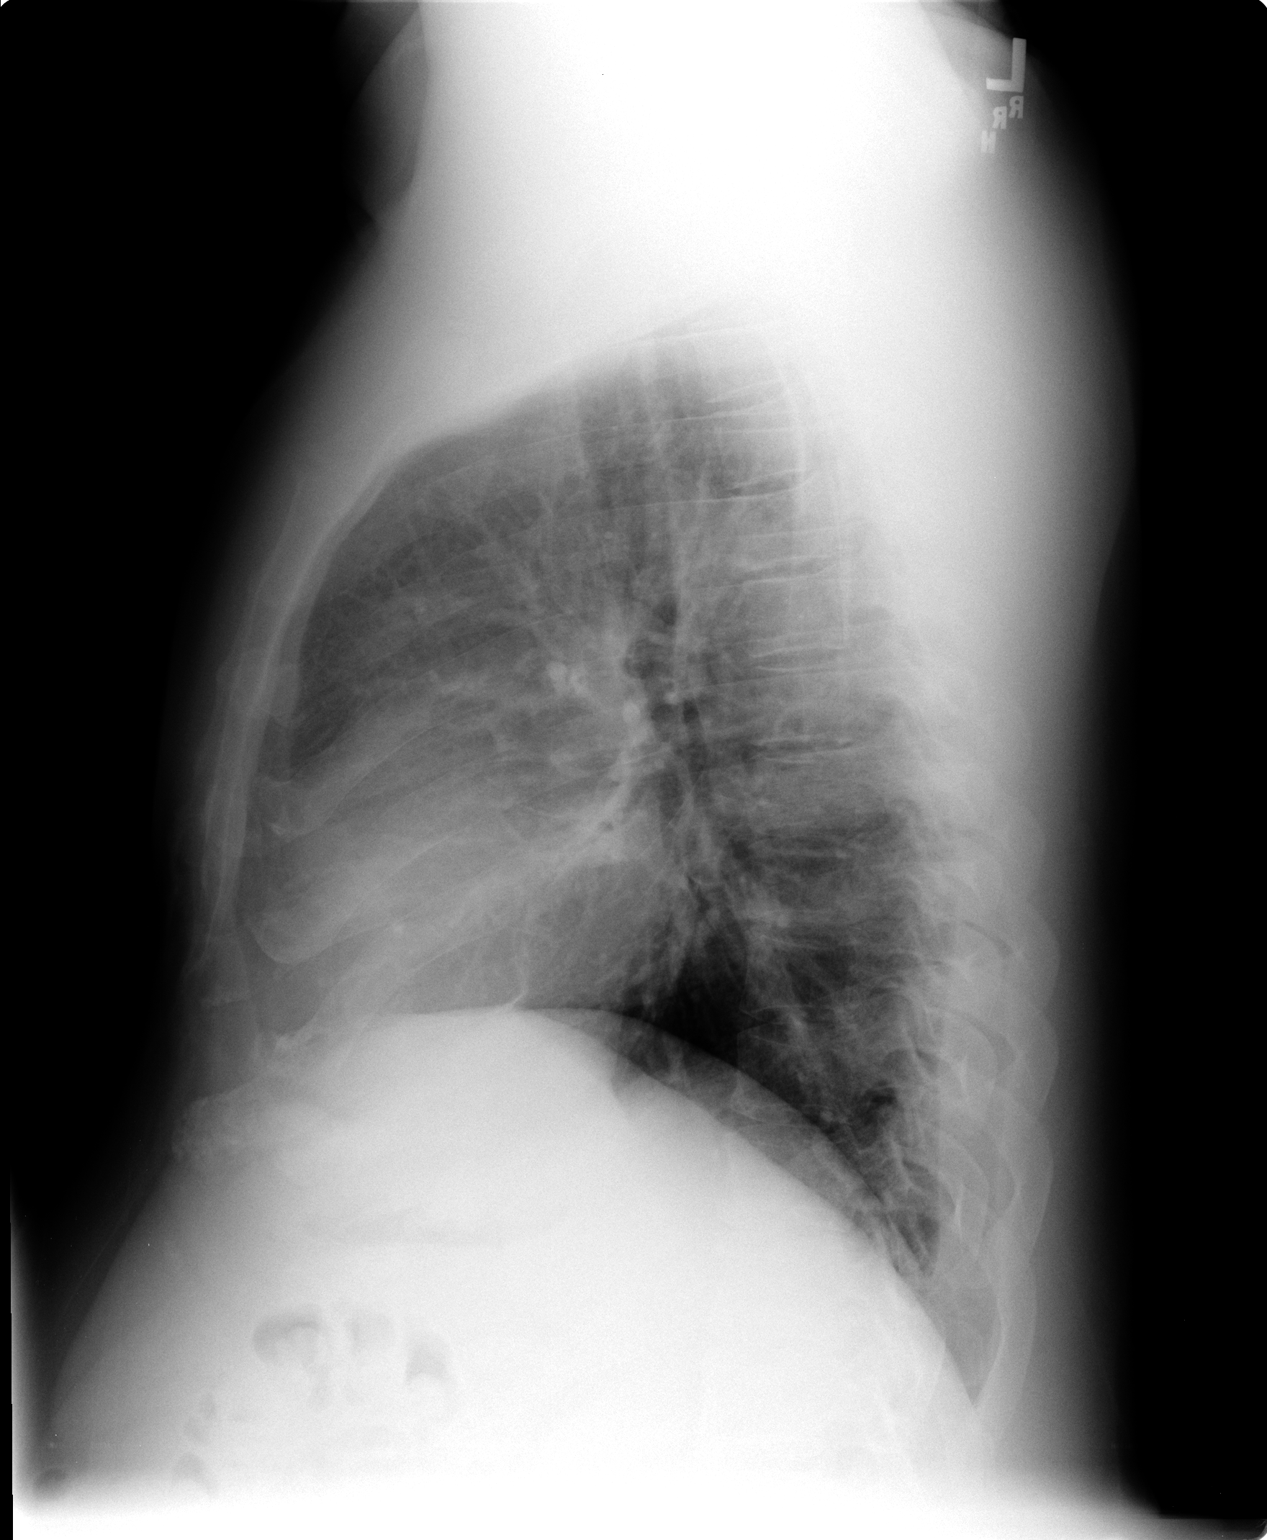

[2 of 2 positions shown; findings below may reference images not displayed]

FINDINGS: The heart size and mediastinal contours are within normal
limits.  Both lungs are clear.  The visualized skeletal structures
are unremarkable.
IMPRESSION: No active disease.

## 2010-05-21 LAB — DIFFERENTIAL
Basophils Absolute: 0 10*3/uL (ref 0.0–0.1)
Basophils Relative: 0 % (ref 0–1)
Neutro Abs: 7.7 10*3/uL (ref 1.7–7.7)

## 2010-05-21 LAB — CBC
Hemoglobin: 15.7 g/dL (ref 13.0–17.0)
MCV: 83 fL (ref 78.0–100.0)
RBC: 5.61 MIL/uL (ref 4.22–5.81)
RDW: 13.5 % (ref 11.5–15.5)
WBC: 11.6 10*3/uL — ABNORMAL HIGH (ref 4.0–10.5)

## 2010-05-21 LAB — BASIC METABOLIC PANEL
GFR calc Af Amer: >60 mL/min (ref >60)
Glucose, Bld: 109 mg/dL — ABNORMAL HIGH (ref 70–99)
Sodium: 138 mEq/L (ref 135–145)

## 2011-07-22 ENCOUNTER — Emergency Department (HOSPITAL_COMMUNITY): Payer: BC Managed Care – PPO

## 2011-07-22 ENCOUNTER — Encounter (HOSPITAL_COMMUNITY): Payer: Self-pay | Admitting: *Deleted

## 2011-07-22 ENCOUNTER — Emergency Department (HOSPITAL_COMMUNITY)
Admission: EM | Admit: 2011-07-22 | Discharge: 2011-07-23 | DRG: 143 | Discharge status: Against Medical Advice | Payer: BC Managed Care – PPO | Attending: Emergency Medicine | Admitting: Emergency Medicine

## 2011-07-22 DIAGNOSIS — Z720 Tobacco use: Secondary | ICD-10-CM | POA: Diagnosis present

## 2011-07-22 DIAGNOSIS — J45909 Unspecified asthma, uncomplicated: Secondary | ICD-10-CM | POA: Diagnosis present

## 2011-07-22 DIAGNOSIS — I160 Hypertensive urgency: Secondary | ICD-10-CM

## 2011-07-22 DIAGNOSIS — N289 Disorder of kidney and ureter, unspecified: Secondary | ICD-10-CM | POA: Diagnosis present

## 2011-07-22 DIAGNOSIS — F17200 Nicotine dependence, unspecified, uncomplicated: Secondary | ICD-10-CM | POA: Diagnosis present

## 2011-07-22 DIAGNOSIS — I10 Essential (primary) hypertension: Secondary | ICD-10-CM | POA: Diagnosis present

## 2011-07-22 DIAGNOSIS — R079 Chest pain, unspecified: Secondary | ICD-10-CM | POA: Diagnosis present

## 2011-07-22 HISTORY — DX: Essential (primary) hypertension: I10

## 2011-07-22 HISTORY — DX: Bronchitis, not specified as acute or chronic: J40

## 2011-07-22 LAB — CBC
HCT: 42.6 % (ref 39.0–52.0)
MCH: 27.4 pg (ref 26.0–34.0)
MCHC: 34.7 g/dL (ref 30.0–36.0)
MCV: 78.9 fL (ref 78.0–100.0)
RDW: 13.8 % (ref 11.5–15.5)
WBC: 11.6 10*3/uL — ABNORMAL HIGH (ref 4.0–10.5)

## 2011-07-22 LAB — COMPREHENSIVE METABOLIC PANEL
ALT: 18 U/L (ref 0–53)
Alkaline Phosphatase: 96 U/L (ref 39–117)
CO2: 27 mEq/L (ref 19–32)
GFR calc non Af Amer: 59 mL/min — ABNORMAL LOW (ref >90)
Sodium: 140 mEq/L (ref 135–145)
Total Protein: 7.5 g/dL (ref 6.0–8.3)

## 2011-07-22 LAB — POCT I-STAT TROPONIN I: Troponin i, poc: 0.01 ng/mL (ref 0.00–0.08)

## 2011-07-22 LAB — URINALYSIS, ROUTINE W REFLEX MICROSCOPIC
Hgb urine dipstick: NEGATIVE
Leukocytes, UA: NEGATIVE
Nitrite: NEGATIVE

## 2011-07-22 LAB — PROTIME-INR
INR: 0.97 (ref 0.00–1.49)
Prothrombin Time: 13.1 seconds (ref 11.6–15.2)

## 2011-07-22 LAB — APTT: aPTT: 27 seconds (ref 24–37)

## 2011-07-22 IMAGING — CR DG CHEST 1V PORT
1 series · 1 of 1 positions shown · non-contrast
Comparison: [DATE]

CLINICAL DATA: Intermittent chest pain for 3 weeks.  History
hypertension, asthma, bronchitis.

PORTABLE CHEST - 1 VIEW

[view not recorded]
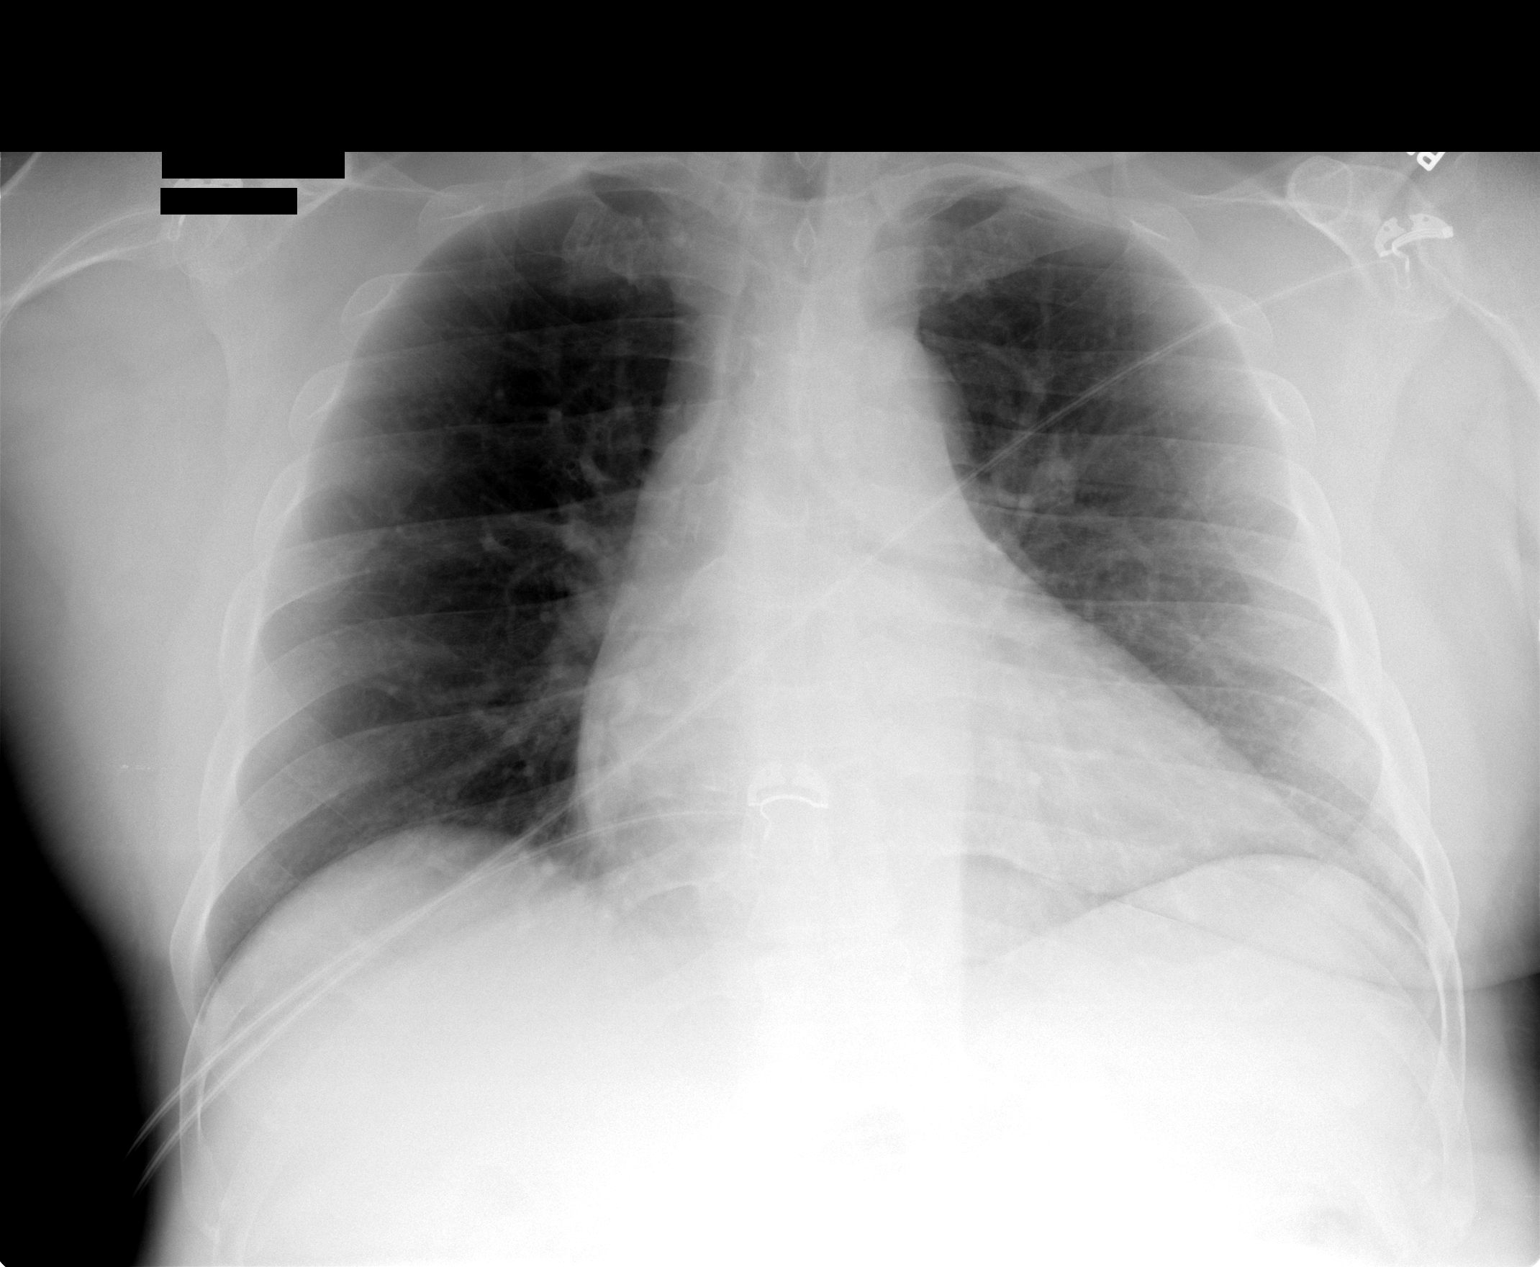

[1 of 1 positions shown; findings below may reference images not displayed]

FINDINGS: The heart is mildly enlarged.  There are no focal
consolidations or pleural effusions.  No pulmonary edema.
Visualized osseous structures have a normal appearance.
IMPRESSION: Cardiomegaly without pulmonary edema.

## 2011-07-22 MED ORDER — NITROGLYCERIN 0.4 MG SL SUBL
0.4000 mg | SUBLINGUAL_TABLET | SUBLINGUAL | Status: DC | PRN
  Administered 2011-07-22 (×2): 0.4 mg via SUBLINGUAL
  Filled 2011-07-22: qty 25

## 2011-07-22 MED ORDER — SODIUM CHLORIDE 0.9 % IV SOLN
1000.0000 mL | INTRAVENOUS | Status: DC
  Administered 2011-07-22: 1000 mL via INTRAVENOUS

## 2011-07-22 MED ORDER — ASPIRIN 81 MG PO CHEW
324.0000 mg | CHEWABLE_TABLET | Freq: Once | ORAL | Status: AC
  Administered 2011-07-22: 324 mg via ORAL
  Filled 2011-07-22: qty 4

## 2011-07-22 MED ORDER — LABETALOL HCL 5 MG/ML IV SOLN
20.0000 mg | Freq: Once | INTRAVENOUS | Status: AC
  Administered 2011-07-22: 20 mg via INTRAVENOUS
  Filled 2011-07-22: qty 4

## 2011-07-22 MED ORDER — SODIUM CHLORIDE 0.9 % IV SOLN: INTRAVENOUS | Status: DC

## 2011-07-22 NOTE — ED Provider Notes (Signed)
History     CSN: [id number]  Arrival date & time 07/22/11  1940   First MD Initiated Contact with Patient 07/22/11 1946      Chief Complaint  Patient presents with  . Chest Pain    (Consider location/radiation/quality/duration/timing/severity/associated sxs/prior treatment) HPI Comments: The patient is a 46 year old man is a C2 had intermittent chest pain for the past 3 weeks. The pain would come and go. He would feel it is a sharp pain. Last night it seemed to get worse and longer to go away. This morning he had pain that took over half an hour go away he had a recurrence of the pain this evening when he was coming up to the hospital be checked. He also notes shortness of breath. The pain disease, and he currently rates the pain at a 4. He's had no prior treatment for his pain. He himself has not had coronary artery disease that he knows of, but his father had coronary artery bypass grafting, a grandfather had heart disease and is able to, and a brother has had a stroke.  Patient is a 46 y.o. male presenting with chest pain.  Chest Pain The chest pain began more than 2 weeks ago. Duration of episode(s) is 30 minutes. Chest pain occurs intermittently. The chest pain is worsening. Associated with: Nothing. At its most intense, the pain is at 5/10. The pain is currently at 4/10. The severity of the pain is moderate. The quality of the pain is described as sharp. The pain does not radiate. Exacerbated by: Nothing. Primary symptoms include shortness of breath, cough and dizziness. Pertinent negatives for primary symptoms include no fever, no nausea and no vomiting.  The shortness of breath began today. The shortness of breath developed with exertion. The shortness of breath is moderate. The patient's medical history does not include CHF, COPD, asthma or chronic lung disease.  Dizziness does not occur with nausea or vomiting.   Associated symptoms include lower extremity edema. He tried nothing for  the symptoms. Risk factors include lack of exercise, male gender, obesity and smoking/tobacco exposure.  His past medical history is significant for hypertension.  His family medical history is significant for CAD in family and stroke in family. Procedure history comments: No prior cardiac workup.Marland Kitchen     Past Medical History  Diagnosis Date  . Hypertension   . Asthma   . Bronchitis     History reviewed. No pertinent past surgical history.  History reviewed. No pertinent family history.  History  Substance Use Topics  . Smoking status: Current Everyday Smoker  . Smokeless tobacco: Not on file  . Alcohol Use: No      Review of Systems  Constitutional: Negative.  Negative for fever and chills.  HENT: Negative.   Eyes: Negative.   Respiratory: Positive for cough and shortness of breath.   Cardiovascular: Positive for chest pain and leg swelling.  Gastrointestinal: Negative.  Negative for nausea and vomiting.  Genitourinary: Positive for urgency.  Musculoskeletal: Negative.   Neurological: Positive for dizziness.  Psychiatric/Behavioral: Negative.     Allergies  Review of patient's allergies indicates no known allergies.  Home Medications  No current outpatient prescriptions on file.  There were no vitals taken for this visit.  Physical Exam  Nursing note and vitals reviewed. Constitutional: He is oriented to person, place, and time.       Moderately obese man in mild distress with chest pain.  HENT:  Head: Normocephalic and atraumatic.  Right Ear:  External ear normal.  Left Ear: External ear normal.  Mouth/Throat: Oropharynx is clear and moist.  Eyes: Conjunctivae and EOM are normal. Pupils are equal, round, and reactive to light.  Neck: Normal range of motion. Neck supple.  Cardiovascular: Normal rate, regular rhythm and normal heart sounds.   Pulmonary/Chest: Effort normal and breath sounds normal.  Abdominal: Soft. Bowel sounds are normal. Distention: mild  abdominal distention. No mass. No point of tenderness.  Musculoskeletal: Normal range of motion. He exhibits no edema and no tenderness.  Neurological: He is alert and oriented to person, place, and time.       No sensory or motor deficits.  Skin: Skin is warm and dry.  Psychiatric: He has a normal mood and affect. His behavior is normal.    ED Course  Procedures (including critical care time)   8:01 PM  Date: 07/22/2011  Rate: 89  Rhythm: normal sinus rhythm  QRS Axis: left  Intervals: normal QRS:  Left ventricular hypertrophy. Poor R wave progression in precordial leads suggests possible old anterior myocardial infarction.  ST/T Wave abnormalities: normal  Conduction Disutrbances:none  Narrative Interpretation: Abnormal EKG.  Old EKG Reviewed: none available   8:39 PM Patient was seen and had physical examination. Laboratory testing was ordered. IV fluids, oxygen, sublingual nitroglycerin were ordered. 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  9:59 PM Results for orders placed during the hospital encounter of 07/22/11  CBC      Component Value Range   WBC 11.6 (*) 4.0 - 10.5 (K/uL)   RBC 5.40  4.22 - 5.81 (MIL/uL)   Hemoglobin 14.8  13.0 - 17.0 (g/dL)   HCT 16.1  09.6 - 04.5 (%)   MCV 78.9  78.0 - 100.0 (fL)   MCH 27.4  26.0 - 34.0 (pg)   MCHC 34.7  30.0 - 36.0 (g/dL)   RDW 40.9  81.1 - 91.4 (%)   Platelets 215  150 - 400 (K/uL)  COMPREHENSIVE METABOLIC PANEL      Component Value Range   Sodium 140  135 - 145 (mEq/L)   Potassium 3.6  3.5 - 5.1 (mEq/L)   Chloride 105  96 - 112 (mEq/L)   CO2 27  19 - 32 (mEq/L)   Glucose, Bld 103 (*) 70 - 99 (mg/dL)   BUN 20  6 - 23 (mg/dL)   Creatinine, Ser 7.82 (*) 0.50 - 1.35 (mg/dL)   Calcium 95.6  8.4 - 10.5 (mg/dL)   Total Protein 7.5  6.0 - 8.3 (g/dL)   Albumin 4.2  3.5 - 5.2 (g/dL)   AST 15  0 - 37 (U/L)   ALT 18  0 - 53 (U/L)   Alkaline Phosphatase 96  39 - 117 (U/L)   Total Bilirubin 0.2 (*) 0.3 - 1.2  (mg/dL)   GFR calc non Af Amer 59 (*) >90 (mL/min)   GFR calc Af Amer 69 (*) >90 (mL/min)  PROTIME-INR      Component Value Range   Prothrombin Time 13.1  11.6 - 15.2 (seconds)   INR 0.97  0.00 - 1.49   APTT      Component Value Range   aPTT 27  24 - 37 (seconds)  POCT I-STAT TROPONIN I      Component Value Range   Troponin i, poc 0.02  0.00 - 0.08 (ng/mL)   Comment 3            Dg Chest Portable 1 View  07/22/2011  *RADIOLOGY REPORT*  Clinical Data:  Intermittent chest pain for 3 weeks.  History hypertension, asthma, bronchitis.  PORTABLE CHEST - 1 VIEW  Comparison: 03/23/2010  Findings: The heart is mildly enlarged.  There are no focal consolidations or pleural effusions.  No pulmonary edema. Visualized osseous structures have a normal appearance.  IMPRESSION: Cardiomegaly without pulmonary edema.  Original Report Authenticated By: Patterson Hammersmith, M.D.    TNI was negative; pt's pain is almost gone.  I advised him that I would call the cardiologist about him because he needs further workup with his chest pain, probably cardiac catheterization.    10:17 PM Pt's case discussed with Aldona Bar, M.D., cardiologist, who recommends IV labetalol for pt's BP, and admission to [facility] with cardiology consult.  10:42 PM Case discussed with Dr. Onalee Hua.  Admit to a telemetry unit to [facility].    1. Chest pain   2. Hypertensive urgency          Carleene Cooper III, MD 07/23/11 [phone]

## 2011-07-22 NOTE — ED Notes (Signed)
Patient denies pain and is resting comfortably.  

## 2011-07-22 NOTE — ED Notes (Signed)
Chest pain off and of for 3 weeks, worse since last night

## 2011-07-23 DIAGNOSIS — R079 Chest pain, unspecified: Secondary | ICD-10-CM

## 2011-07-23 DIAGNOSIS — I10 Essential (primary) hypertension: Secondary | ICD-10-CM

## 2011-07-23 DIAGNOSIS — F17200 Nicotine dependence, unspecified, uncomplicated: Secondary | ICD-10-CM

## 2011-07-23 NOTE — Consult Note (Signed)
Chief Complaint:  Chest pain  HPI: 46 year old male with a history of hypertension tobacco abuse who comes in with waxing and waning chest pain has been on for 3 weeks. Denies any fevers or cough or lower extremity edema. Patient does not want to be admitted and wants to leave AMA.  Review of Systems:  Not obtained as patient leaving AMA  Past Medical History: Past Medical History  Diagnosis Date  . Hypertension   . Asthma   . Bronchitis    History reviewed. No pertinent past surgical history.  Medications: Prior to Admission medications   Medication Sig Start Date End Date Taking? Authorizing Provider  PRESCRIPTION MEDICATION Place 1-2 drops into the right ear as needed. For ear pain: NAME UNKNOWN.   Yes Historical Provider, MD    Allergies:  No Known Allergies  Social History:  reports that he has been smoking.  He does not have any smokeless tobacco history on file. He reports that he does not drink alcohol or use illicit drugs.  Family History: History reviewed. No pertinent family history.  Physical Exam: Filed Vitals:   07/22/11 2053 07/22/11 2245 07/22/11 2246 07/22/11 2331  BP: 163/102 157/103  151/104  Pulse:   76 70  Temp:    99 F (37.2 C)  TempSrc:      Resp: 20  19 16   Height:      Weight:      SpO2: 95%  96% 97%   BP 151/104  Pulse 70  Temp(Src) 99 F (37.2 C) (Oral)  Resp 16  Ht 5\' 8"  (1.727 m)  Wt 102.059 kg (225 lb)  BMI 34.21 kg/m2  SpO2 97% General appearance: alert, cooperative and no distress Lungs: clear to auscultation bilaterally Heart: regular rate and rhythm, S1, S2 normal, no murmur, click, rub or gallop Abdomen: soft, non-tender; bowel sounds normal; no masses,  no organomegaly Extremities: extremities normal, atraumatic, no cyanosis or edema Pulses: 2+ and symmetric Skin: Skin color, texture, turgor normal. No rashes or lesions Neurologic: Grossly normal    Labs on Admission:   [facility] 07/22/11 2038  NA 140  K 3.6    CL 105  CO2 27  GLUCOSE 103*  BUN 20  CREATININE 1.39*  CALCIUM 10.1  MG --  PHOS --    Basename 07/22/11 2038  AST 15  ALT 18  ALKPHOS 96  BILITOT 0.2*  PROT 7.5  ALBUMIN 4.2    Basename 07/22/11 2038  WBC 11.6*  NEUTROABS --  HGB 14.8  HCT 42.6  MCV 78.9  PLT 215    Radiological Exams on Admission: Dg Chest Portable 1 View  07/22/2011  *RADIOLOGY REPORT*  Clinical Data: Intermittent chest pain for 3 weeks.  History hypertension, asthma, bronchitis.  PORTABLE CHEST - 1 VIEW  Comparison: 03/23/2010  Findings: The heart is mildly enlarged.  There are no focal consolidations or pleural effusions.  No pulmonary edema. Visualized osseous structures have a normal appearance.  IMPRESSION: Cardiomegaly without pulmonary edema.  Original Report Authenticated By: Patterson Hammersmith, M.D.    Assessment/Plan Present on Admission:  46 year old male with chest pain  .Chest pain .Renal insufficiency .Tobacco abuse .Hypertension  Patient has no acute changes on his 12-lead EKG and is wanting to leave AMA because he does not want to miss work tomorrow. He just started a new job and has been out of work for 2 years it was very worried about missing work. I told him that I cannot guarantee this was not his  heart causing his chest pain. He is going to call his PCP in the morning in [place] to make an appointment for further cardiac stress testing and evaluation. He knows that he needs to be placed on medicine for his blood pressure likely. I advised him to take baby aspirin coated a day. I advised him that if he has any recurrent chest pain that he really needs to come back to the emergency room. He understands that he could die of a heart attack by ignoring this and not being admitted tonight. Patient leaving the emergency room again AMA.   Bernyce Brimley A [phone] 07/23/2011, 12:20 AM

## 2011-07-23 NOTE — ED Provider Notes (Signed)
2340 Called to the room to answer questions for the patient regarding admission. He does not want to be admitted. Explained the risk of leaving with his history and presentation. He insists he wants to  leave AMA. Dr. Onalee Hua, hospitalist was notified by nursing.  Nicoletta Dress. Colon Branch, MD 07/23/11 0005

## 2011-07-23 NOTE — ED Notes (Signed)
Admitting doctor present at bedside attempting to get patient to be admitted. Pt refused.

## 2011-08-01 ENCOUNTER — Encounter (HOSPITAL_COMMUNITY): Payer: Self-pay | Admitting: *Deleted

## 2011-08-01 ENCOUNTER — Emergency Department (HOSPITAL_COMMUNITY): Payer: BC Managed Care – PPO

## 2011-08-01 ENCOUNTER — Inpatient Hospital Stay (HOSPITAL_COMMUNITY)
Admission: EM | Admit: 2011-08-01 | Discharge: 2011-08-03 | DRG: 134 | Disposition: A | Discharge status: Home / Self Care | Payer: BC Managed Care – PPO | Attending: Internal Medicine | Admitting: Internal Medicine

## 2011-08-01 DIAGNOSIS — R9431 Abnormal electrocardiogram [ECG] [EKG]: Secondary | ICD-10-CM | POA: Diagnosis present

## 2011-08-01 DIAGNOSIS — Z8249 Family history of ischemic heart disease and other diseases of the circulatory system: Secondary | ICD-10-CM

## 2011-08-01 DIAGNOSIS — F17200 Nicotine dependence, unspecified, uncomplicated: Secondary | ICD-10-CM

## 2011-08-01 DIAGNOSIS — R079 Chest pain, unspecified: Secondary | ICD-10-CM

## 2011-08-01 DIAGNOSIS — E119 Type 2 diabetes mellitus without complications: Secondary | ICD-10-CM | POA: Diagnosis present

## 2011-08-01 DIAGNOSIS — Z823 Family history of stroke: Secondary | ICD-10-CM

## 2011-08-01 DIAGNOSIS — Z720 Tobacco use: Secondary | ICD-10-CM | POA: Diagnosis present

## 2011-08-01 DIAGNOSIS — Z7189 Other specified counseling: Secondary | ICD-10-CM

## 2011-08-01 DIAGNOSIS — I10 Essential (primary) hypertension: Secondary | ICD-10-CM

## 2011-08-01 DIAGNOSIS — J45909 Unspecified asthma, uncomplicated: Secondary | ICD-10-CM | POA: Diagnosis present

## 2011-08-01 DIAGNOSIS — R0602 Shortness of breath: Secondary | ICD-10-CM

## 2011-08-01 LAB — CBC
HCT: 41.8 % (ref 39.0–52.0)
Hemoglobin: 15.2 g/dL (ref 13.0–17.0)
Hemoglobin: 14.3 g/dL (ref 13.0–17.0)
MCH: 27.8 pg (ref 26.0–34.0)
MCHC: 35.3 g/dL (ref 30.0–36.0)
Platelets: 225 10*3/uL (ref 150–400)
RBC: 5.47 MIL/uL (ref 4.22–5.81)
RBC: 5.3 MIL/uL (ref 4.22–5.81)

## 2011-08-01 LAB — COMPREHENSIVE METABOLIC PANEL
ALT: 19 U/L (ref 0–53)
AST: 13 U/L (ref 0–37)
Albumin: 4.2 g/dL (ref 3.5–5.2)
Alkaline Phosphatase: 90 U/L (ref 39–117)
Chloride: 105 mEq/L (ref 96–112)
Potassium: 3.7 mEq/L (ref 3.5–5.1)
Sodium: 143 mEq/L (ref 135–145)
Total Bilirubin: 0.3 mg/dL (ref 0.3–1.2)
Total Protein: 7.1 g/dL (ref 6.0–8.3)

## 2011-08-01 LAB — CREATININE, SERUM
Creatinine, Ser: 1.39 mg/dL — ABNORMAL HIGH (ref 0.50–1.35)
GFR calc Af Amer: 69 mL/min — ABNORMAL LOW (ref >90)
GFR calc non Af Amer: 59 mL/min — ABNORMAL LOW (ref >90)

## 2011-08-01 LAB — DIFFERENTIAL
Basophils Relative: 1 % (ref 0–1)
Eosinophils Absolute: 0.2 10*3/uL (ref 0.0–0.7)
Monocytes Relative: 7 % (ref 3–12)
Neutro Abs: 5.1 10*3/uL (ref 1.7–7.7)
Neutrophils Relative %: 59 % (ref 43–77)

## 2011-08-01 LAB — CARDIAC PANEL(CRET KIN+CKTOT+MB+TROPI)
CK, MB: 2.3 ng/mL (ref 0.3–4.0)
Total CK: 130 U/L (ref 7–232)

## 2011-08-01 LAB — PRO B NATRIURETIC PEPTIDE: Pro B Natriuretic peptide (BNP): 30.1 pg/mL (ref 0–125)

## 2011-08-01 IMAGING — CR DG CHEST 2V
2 series · 2 of 2 positions shown · non-contrast
Comparison: [DATE]

CLINICAL DATA: Chest pain off and on for 6 weeks, shortness of
breath, history hypertension, asthma, bronchitis, smoking

CHEST - 2 VIEW

[w chest pa]
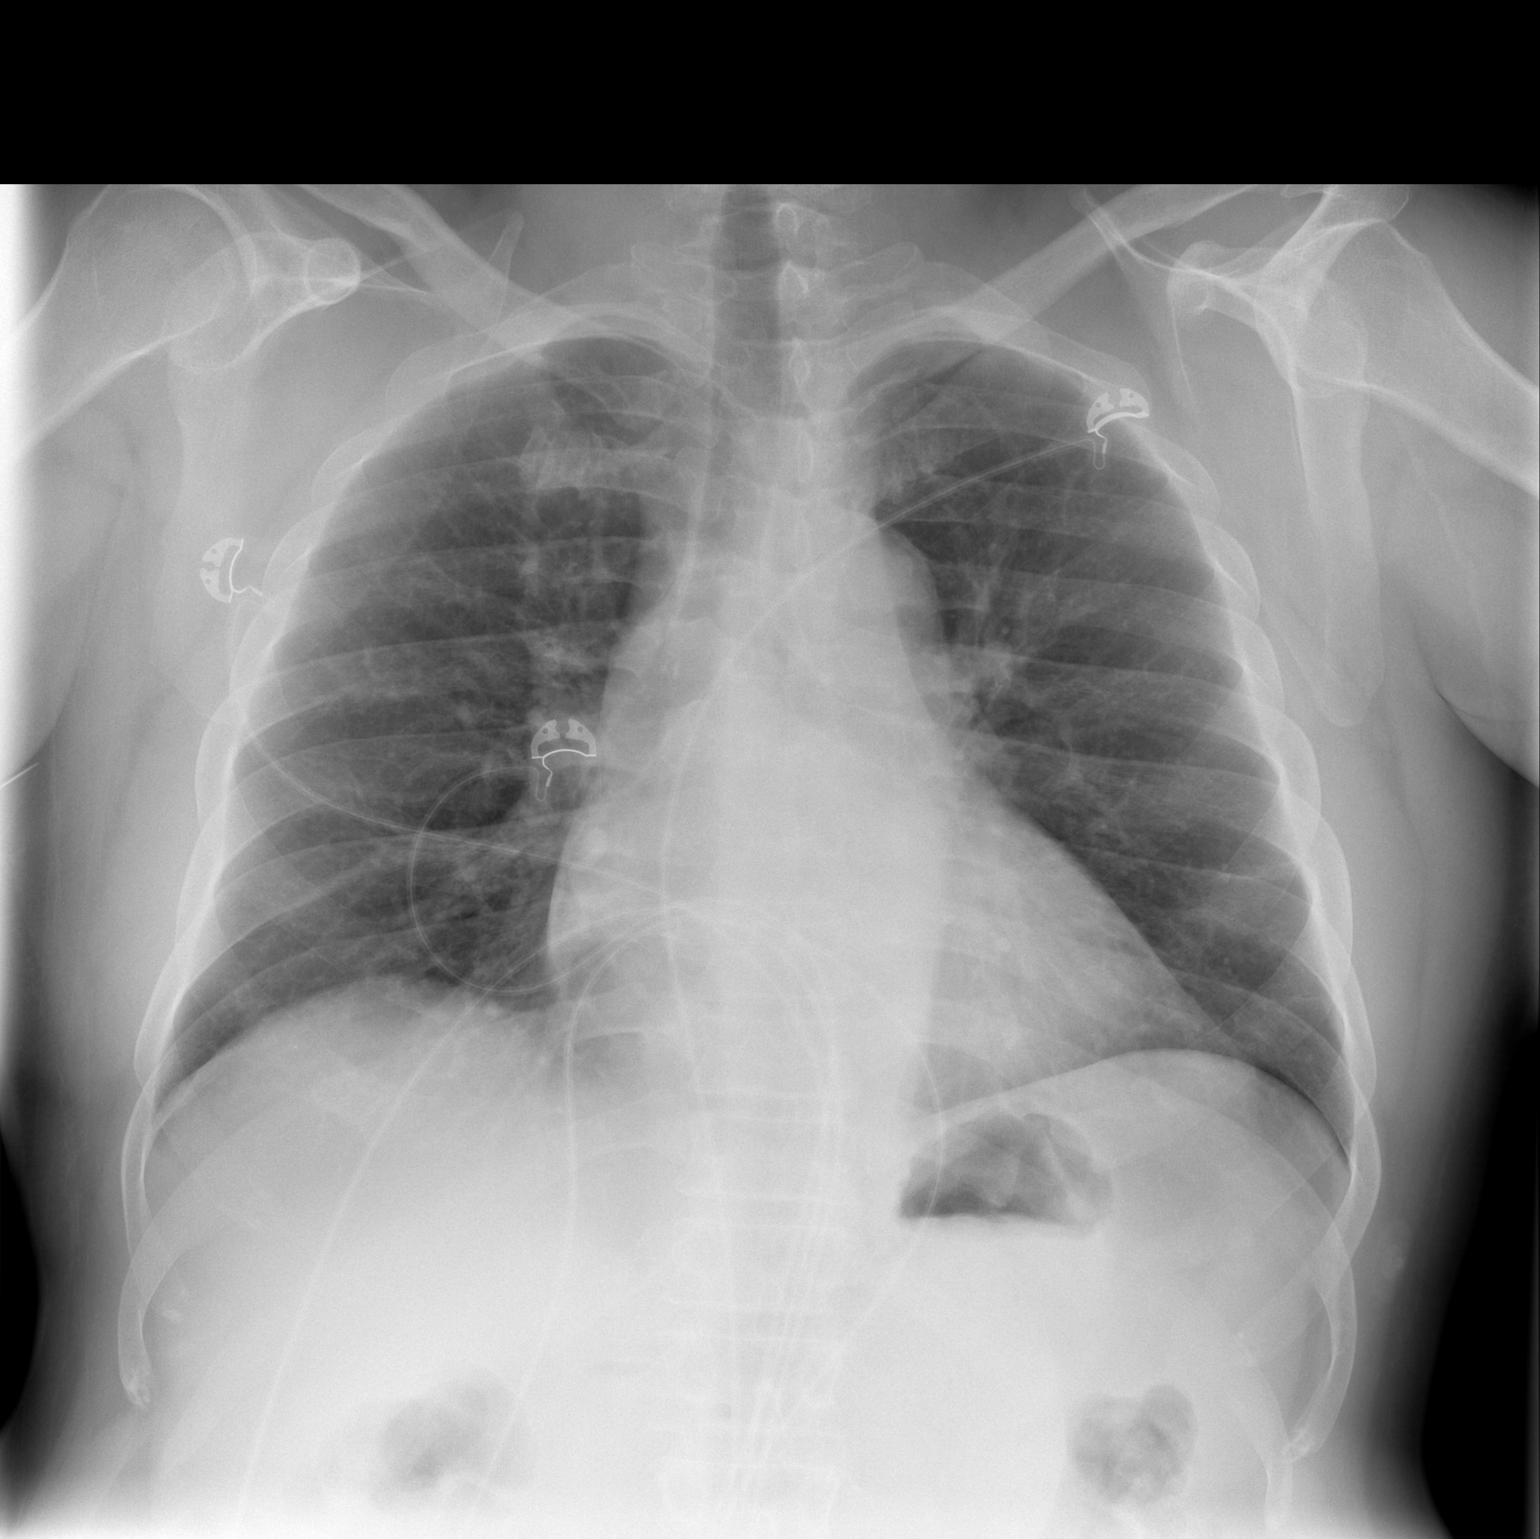

[w chest ap]
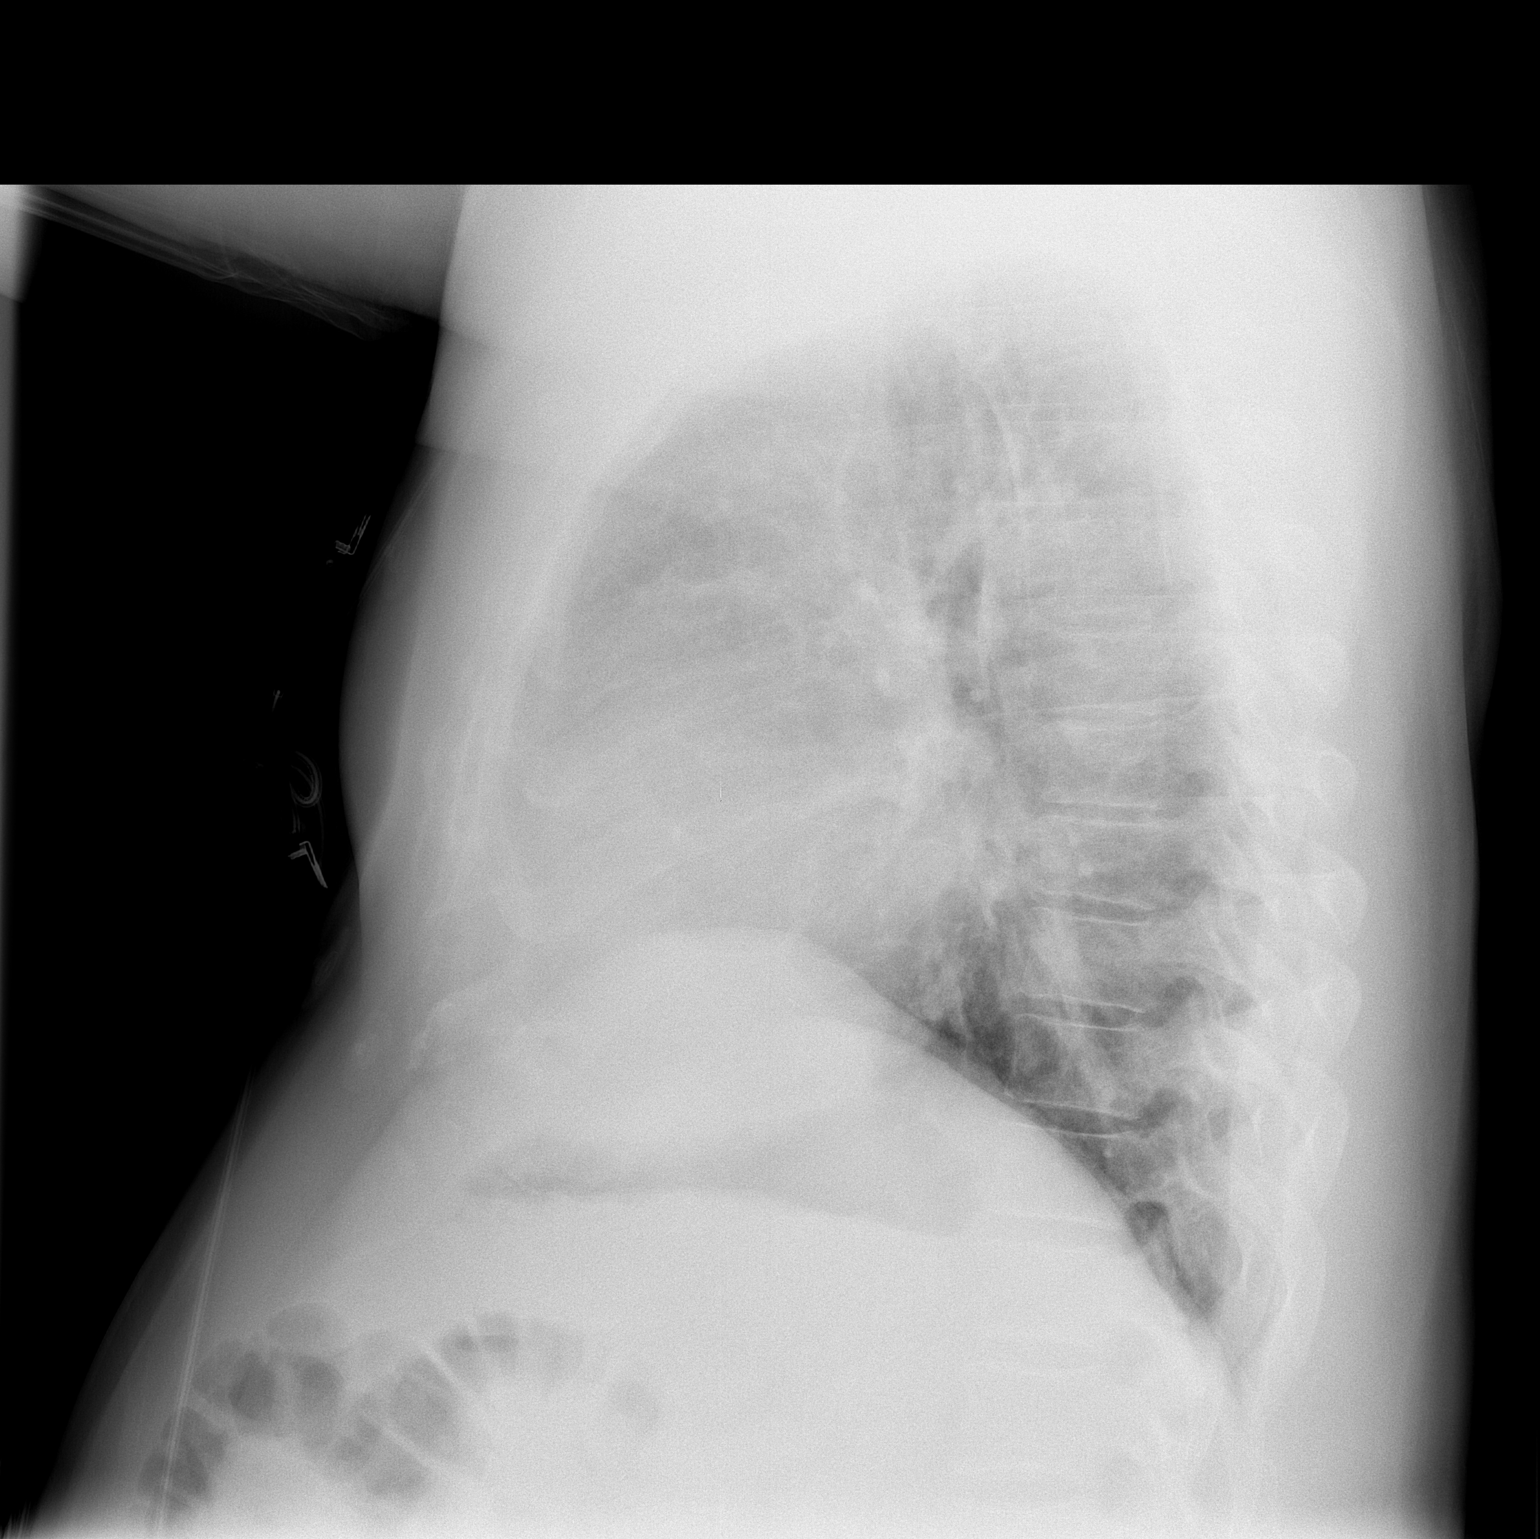

[2 of 2 positions shown; findings below may reference images not displayed]

FINDINGS: Borderline enlargement of cardiac silhouette.
Mediastinal contours and pulmonary vascularity normal.
Lungs clear.
No pleural effusion or pneumothorax.
No acute osseous findings.
IMPRESSION: No acute abnormalities.

## 2011-08-01 MED ORDER — ONDANSETRON HCL 4 MG PO TABS: 4.0000 mg | ORAL_TABLET | Freq: Four times a day (QID) | ORAL | Status: DC | PRN

## 2011-08-01 MED ORDER — MORPHINE SULFATE 2 MG/ML IJ SOLN
1.0000 mg | INTRAMUSCULAR | Status: DC | PRN
  Administered 2011-08-01: 1 mg via INTRAVENOUS
  Filled 2011-08-01: qty 1

## 2011-08-01 MED ORDER — METOPROLOL TARTRATE 25 MG PO TABS
25.0000 mg | ORAL_TABLET | Freq: Two times a day (BID) | ORAL | Status: DC
  Administered 2011-08-01 – 2011-08-02 (×2): 25 mg via ORAL
  Filled 2011-08-01 (×3): qty 1

## 2011-08-01 MED ORDER — ASPIRIN 81 MG PO CHEW
81.0000 mg | CHEWABLE_TABLET | Freq: Every day | ORAL | Status: DC
  Administered 2011-08-02 – 2011-08-03 (×2): 81 mg via ORAL
  Filled 2011-08-01 (×2): qty 1

## 2011-08-01 MED ORDER — ASPIRIN 81 MG PO CHEW
324.0000 mg | CHEWABLE_TABLET | Freq: Once | ORAL | Status: AC
  Administered 2011-08-01: 324 mg via ORAL
  Filled 2011-08-01: qty 4

## 2011-08-01 MED ORDER — ACETAMINOPHEN 650 MG RE SUPP: 650.0000 mg | Freq: Four times a day (QID) | RECTAL | Status: DC | PRN

## 2011-08-01 MED ORDER — ONDANSETRON HCL 4 MG/2ML IJ SOLN: 4.0000 mg | Freq: Four times a day (QID) | INTRAMUSCULAR | Status: DC | PRN

## 2011-08-01 MED ORDER — ENOXAPARIN SODIUM 40 MG/0.4ML ~~LOC~~ SOLN
40.0000 mg | SUBCUTANEOUS | Status: DC
  Administered 2011-08-01 – 2011-08-02 (×2): 40 mg via SUBCUTANEOUS
  Filled 2011-08-01 (×3): qty 0.4

## 2011-08-01 MED ORDER — OXYCODONE HCL 5 MG PO TABS
5.0000 mg | ORAL_TABLET | ORAL | Status: DC | PRN
  Administered 2011-08-02 (×3): 5 mg via ORAL
  Filled 2011-08-01 (×3): qty 1

## 2011-08-01 MED ORDER — SENNOSIDES-DOCUSATE SODIUM 8.6-50 MG PO TABS
1.0000 | ORAL_TABLET | Freq: Every evening | ORAL | Status: DC | PRN
  Filled 2011-08-01: qty 1

## 2011-08-01 MED ORDER — HYDROCHLOROTHIAZIDE 25 MG PO TABS
25.0000 mg | ORAL_TABLET | Freq: Every day | ORAL | Status: DC
  Administered 2011-08-01 – 2011-08-03 (×3): 25 mg via ORAL
  Filled 2011-08-01 (×3): qty 1

## 2011-08-01 MED ORDER — ASPIRIN EC 81 MG PO TBEC: 81.0000 mg | DELAYED_RELEASE_TABLET | Freq: Every day | ORAL | Status: DC

## 2011-08-01 MED ORDER — AMLODIPINE BESYLATE 5 MG PO TABS
5.0000 mg | ORAL_TABLET | Freq: Every day | ORAL | Status: DC
  Administered 2011-08-01 – 2011-08-02 (×2): 5 mg via ORAL
  Filled 2011-08-01 (×2): qty 1

## 2011-08-01 MED ORDER — SODIUM CHLORIDE 0.9 % IJ SOLN
3.0000 mL | Freq: Two times a day (BID) | INTRAMUSCULAR | Status: DC
  Administered 2011-08-01 – 2011-08-03 (×3): 3 mL via INTRAVENOUS

## 2011-08-01 MED ORDER — HYDRALAZINE HCL 20 MG/ML IJ SOLN
5.0000 mg | Freq: Four times a day (QID) | INTRAMUSCULAR | Status: DC | PRN
  Administered 2011-08-01: 5 mg via INTRAVENOUS
  Filled 2011-08-01: qty 1

## 2011-08-01 MED ORDER — NICOTINE 14 MG/24HR TD PT24
14.0000 mg | MEDICATED_PATCH | Freq: Every day | TRANSDERMAL | Status: DC
  Administered 2011-08-01 – 2011-08-03 (×3): 14 mg via TRANSDERMAL
  Filled 2011-08-01 (×3): qty 1

## 2011-08-01 MED ORDER — SODIUM CHLORIDE 0.9 % IV SOLN
INTRAVENOUS | Status: DC
  Administered 2011-08-01: 22:00:00 via INTRAVENOUS

## 2011-08-01 MED ORDER — ACETAMINOPHEN 325 MG PO TABS
650.0000 mg | ORAL_TABLET | Freq: Four times a day (QID) | ORAL | Status: DC | PRN
  Administered 2011-08-02: 650 mg via ORAL

## 2011-08-01 NOTE — ED Notes (Signed)
The pt has had mid-chest pain for 3 weeks and he also has some lt arm pain  And cannot hear from  His lt ear

## 2011-08-01 NOTE — H&P (Signed)
PCP:   No primary provider on file.   Chief Complaint:  Chest pain  HPI: Patient is a pleasant 46 year old African American man with no significant past medical history, although he has not had routine medical followup, who presents with complaints of substernal chest pain radiating to his left arm. Chest pain has been present on and off for the past month. He in fact went to [facility] on May 28 and it was recommended that he be admitted to the hospital however he decided to leave AGAINST MEDICAL ADVICE. Chest pain usually happens with exertion, although he has noted occasions when the chest pain has come on while watching TV. He has not noticed any alleviating or aggravating factors. His coronary artery disease risk factors include uncontrolled hypertension, tobacco abuse, family history with a brother who had a stroke in his 94s and her father had an MI in his early 20s. We are asked to admit him for further  evaluation and management.  Allergies:  No Known Allergies    Past Medical History  Diagnosis Date  . Hypertension   . Asthma   . Bronchitis     History reviewed. No pertinent past surgical history.  Prior to Admission medications   Medication Sig Start Date End Date Taking? Authorizing Provider  Aspirin-Salicylamide-Caffeine (BC HEADACHE POWDER PO) Take 1 packet by mouth daily as needed. For pain   Yes Historical Provider, MD    Social History:  reports that he has been smoking.  He does not have any smokeless tobacco history on file. He reports that he does not drink alcohol or use illicit drugs.  No family history on file.  Review of Systems:  Negative except as mentioned in history of present illness.  Physical Exam: Blood pressure 160/101, pulse 65, temperature 97.7 F (36.5 C), temperature source Oral, resp. rate 18, SpO2 99.00%. General: Alert, awake, oriented x3, in no acute distress. HEENT: Normocephalic, atraumatic, pupils equal round and reactive to  light, intact extraocular movements, moist mucous membranes. Neck: Supple, no JVD, no lymphadenopathy, no bruits, no goiter. Cardiovascular: Regular rate and rhythm, no murmurs, rubs or gallops. Lungs: Clear to auscultation bilaterally. Abdomen: Soft, nontender, nondistended, positive bowel sounds, no masses or organomegaly noted. Extremities: No clubbing, cyanosis or edema, positive pedal pulses. Neurologic: Grossly intact and nonfocal.  Labs on Admission:  Results for orders placed during the hospital encounter of 08/01/11 (from the past 48 hour(s))  CBC     Status: Normal   Collection Time   08/01/11  5:14 PM      Component Value Range Comment   WBC 8.7  4.0 - 10.5 (K/uL)    RBC 5.47  4.22 - 5.81 (MIL/uL)    Hemoglobin 15.2  13.0 - 17.0 (g/dL)    HCT 16.1  09.6 - 04.5 (%)    MCV 78.8  78.0 - 100.0 (fL)    MCH 27.8  26.0 - 34.0 (pg)    MCHC 35.3  30.0 - 36.0 (g/dL)    RDW 40.9  81.1 - 91.4 (%)    Platelets 225  150 - 400 (K/uL)   DIFFERENTIAL     Status: Normal   Collection Time   08/01/11  5:14 PM      Component Value Range Comment   Neutrophils Relative 59  43 - 77 (%)    Neutro Abs 5.1  1.7 - 7.7 (K/uL)    Lymphocytes Relative 32  12 - 46 (%)    Lymphs Abs 2.8  0.7 - 4.0 (K/uL)    Monocytes Relative 7  3 - 12 (%)    Monocytes Absolute 0.6  0.1 - 1.0 (K/uL)    Eosinophils Relative 2  0 - 5 (%)    Eosinophils Absolute 0.2  0.0 - 0.7 (K/uL)    Basophils Relative 1  0 - 1 (%)    Basophils Absolute 0.1  0.0 - 0.1 (K/uL)   COMPREHENSIVE METABOLIC PANEL     Status: Abnormal   Collection Time   08/01/11  5:14 PM      Component Value Range Comment   Sodium 143  135 - 145 (mEq/L)    Potassium 3.7  3.5 - 5.1 (mEq/L)    Chloride 105  96 - 112 (mEq/L)    CO2 27  19 - 32 (mEq/L)    Glucose, Bld 124 (*) 70 - 99 (mg/dL)    BUN 15  6 - 23 (mg/dL)    Creatinine, Ser 0.10  0.50 - 1.35 (mg/dL)    Calcium 9.4  8.4 - 10.5 (mg/dL)    Total Protein 7.1  6.0 - 8.3 (g/dL)    Albumin 4.2  3.5 -  5.2 (g/dL)    AST 13  0 - 37 (U/L)    ALT 19  0 - 53 (U/L)    Alkaline Phosphatase 90  39 - 117 (U/L)    Total Bilirubin 0.3  0.3 - 1.2 (mg/dL)    GFR calc non Af Amer 63 (*) >90 (mL/min)    GFR calc Af Amer 73 (*) >90 (mL/min)   TROPONIN I     Status: Normal   Collection Time   08/01/11  5:18 PM      Component Value Range Comment   Troponin I <0.30  <0.30 (ng/mL)     Radiological Exams on Admission: Dg Chest 2 View  08/01/2011  *RADIOLOGY REPORT*  Clinical Data: Chest pain off and on for 6 weeks, shortness of breath, history hypertension, asthma, bronchitis, smoking  CHEST - 2 VIEW  Comparison: 07/22/2011  Findings: Borderline enlargement of cardiac silhouette. Mediastinal contours and pulmonary vascularity normal. Lungs clear. No pleural effusion or pneumothorax. No acute osseous findings.  IMPRESSION: No acute abnormalities.  Original Report Authenticated By: Lollie Marrow, M.D.   EKG: Anterolateral T wave inversions.  Assessment/Plan Principal Problem:  *Chest pain Active Problems:  Tobacco abuse  HTN (hypertension)   #1 chest pain: Is certainly concerning for acute coronary syndrome given his coronary artery disease risk factors, EKG changes and symptomatology. We'll admit to the hospital to a telemetry unit. Will cycle cardiac enzymes, will recheck EKG in the morning. We'll also further risk stratify with a fasting lipid profile and a hemoglobin A1c. We'll attempt to better control his hypertension. We'll place on daily aspirin. We'll order a 2-D echo. Will likely benefit from cardiology consultation in the morning. I do believe that he will eventually need a stress test and I would advocate for this being done in the inpatient setting versus the outpatient given his history of leaving the hospital AGAINST MEDICAL ADVICE.  #2 uncontrolled hypertension: Patient has not been taking any medications. Will start on metoprolol and hydrochlorothiazide. We'll also order as needed  hydralazine. We'll further titrate as needed. Will most certainly need close followup with his primary care physician once discharged from the hospital.   #3 tobacco abuse: Patient seems ready to quit. For now while in the hospital, we'll give him a nicotine patch.  #4 DVT prophylaxis: Lovenox.  Time Spent on Admission: Greater than 30 minutes.  Baldomero Mirarchi [facility] [phone]  08/01/2011, 7:30 PM

## 2011-08-01 NOTE — ED Provider Notes (Signed)
History     CSN: [id number]  Arrival date & time 08/01/11  1651   First MD Initiated Contact with Patient 08/01/11 1714      Chief Complaint  Patient presents with  . Chest Pain    (Consider location/radiation/quality/duration/timing/severity/associated sxs/prior treatment) HPI Comments: Patient presents with chief complaint of chest pain. Patient was seen at [facility] 2 weeks ago and left AMA. It was recommended at that time that the patient be admitted to the hospital for evaluation of his chest pain. Patient saw his primary care Dr. today noted abnormalities on his EKG and sent the patient to hospital for further evaluation. Patient relates a three-week history of left chest pain radiates to his left arm. He describes it as sharp and as a pressure. It is sometimes started at rest and other times is brought on by exertion. Patient denies nausea, palpitations, or diaphoresis. The pain will last anywhere from several minutes to 30 minutes. Nothing makes it worse. Patient has a history of hypertension. He denies a history of diabetes, high cholesterol. Patient has an approximately 20 year history of smoking. Patient has a brother who had a stroke in his 63s. Patient's father had a CABG in his 34s.  Patient is a 46 y.o. male presenting with chest pain. The history is provided by the patient.  Chest Pain The chest pain began more than 2 weeks ago. Episode Length: several minutes to 30 minutes. Chest pain occurs intermittently. The chest pain is unchanged. The pain is associated with exertion. The severity of the pain is mild. The quality of the pain is described as sharp and pressure-like. The pain radiates to the left arm. Primary symptoms include shortness of breath. Pertinent negatives for primary symptoms include no fever, no syncope, no cough, no palpitations, no abdominal pain, no nausea and no vomiting.  Pertinent negatives for associated symptoms include no diaphoresis. He tried  nothing for the symptoms. Risk factors include male gender.  His past medical history is significant for hypertension.  Pertinent negatives for past medical history include no diabetes, no hyperlipidemia and no MI.  His family medical history is significant for CAD in family and stroke in family.  Procedure history is negative for cardiac catheterization.     Past Medical History  Diagnosis Date  . Hypertension   . Asthma   . Bronchitis     History reviewed. No pertinent past surgical history.  No family history on file.  History  Substance Use Topics  . Smoking status: Current Everyday Smoker  . Smokeless tobacco: Not on file  . Alcohol Use: No      Review of Systems  Constitutional: Negative for fever and diaphoresis.  HENT: Negative for neck pain.   Eyes: Negative for redness.  Respiratory: Positive for shortness of breath. Negative for cough.   Cardiovascular: Positive for chest pain. Negative for palpitations, leg swelling and syncope.  Gastrointestinal: Negative for nausea, vomiting and abdominal pain.  Genitourinary: Negative for dysuria.  Musculoskeletal: Negative for back pain.  Skin: Negative for rash.  Neurological: Negative for syncope and light-headedness.    Allergies  Review of patient's allergies indicates no known allergies.  Home Medications   Current Outpatient Rx  Name Route Sig Dispense Refill  . BC HEADACHE POWDER PO Oral Take 1 packet by mouth daily as needed. For pain      BP 123/105  Pulse 77  Temp(Src) 98 F (36.7 C) (Oral)  Resp 26  SpO2 96%  Physical Exam  Nursing note and vitals reviewed. Constitutional: He appears well-developed and well-nourished.  HENT:  Head: Normocephalic and atraumatic.  Mouth/Throat: Mucous membranes are normal. Mucous membranes are not dry.  Eyes: Conjunctivae are normal.  Neck: Trachea normal and normal range of motion. Neck supple. Normal carotid pulses and no JVD present. No muscular tenderness  present. Carotid bruit is not present. No tracheal deviation present.  Cardiovascular: Normal rate, regular rhythm, S1 normal, S2 normal, normal heart sounds and intact distal pulses.  Exam reveals no distant heart sounds and no decreased pulses.   No murmur heard. Pulmonary/Chest: Effort normal and breath sounds normal. No respiratory distress. He has no wheezes. He exhibits no tenderness.  Abdominal: Soft. Normal aorta and bowel sounds are normal. There is no tenderness. There is no rebound and no guarding.  Musculoskeletal: He exhibits no edema.  Neurological: He is alert.  Skin: Skin is warm and dry. He is not diaphoretic. No cyanosis. No pallor.  Psychiatric: He has a normal mood and affect.    ED Course  Procedures (including critical care time)  Labs Reviewed  COMPREHENSIVE METABOLIC PANEL - Abnormal; Notable for the following:    Glucose, Bld 124 (*)    GFR calc non Af Amer 63 (*)    GFR calc Af Amer 73 (*)    All other components within normal limits  CBC  DIFFERENTIAL  TROPONIN I   Dg Chest 2 View  08/01/2011  *RADIOLOGY REPORT*  Clinical Data: Chest pain off and on for 6 weeks, shortness of breath, history hypertension, asthma, bronchitis, smoking  CHEST - 2 VIEW  Comparison: 07/22/2011  Findings: Borderline enlargement of cardiac silhouette. Mediastinal contours and pulmonary vascularity normal. Lungs clear. No pleural effusion or pneumothorax. No acute osseous findings.  IMPRESSION: No acute abnormalities.  Original Report Authenticated By: Lollie Marrow, M.D.     1. Chest pain     5:34 PM Patient seen and examined. Work-up initiated. Medications ordered.   Vital signs reviewed and are as follows: Filed Vitals:   08/01/11 1724  BP: 123/105  Pulse:   Temp:   Resp:   BP 123/105  Pulse 77  Temp(Src) 98 F (36.7 C) (Oral)  Resp 26  SpO2 96%   Date: 08/01/2011  Rate: 74  Rhythm: normal sinus rhythm  QRS Axis: normal  Intervals: normal  ST/T Wave  abnormalities: nonspecific T wave changes  Conduction Disutrbances:none  Narrative Interpretation:   Old EKG Reviewed: changes noted from 02/23/2005, new inverted t-waves in I, aVL, V5, V6  6:36 PM Discussed with Dr. Golda Acre. Admit for chest pain.   6:56 PM [facility] cards consulted and will see patient.   MDM  Moderate risk for ACS, unstable angina. Admit for evaluation.         Renne Crigler, [place] 08/01/11 1839  Renne Crigler, [place] 08/01/11 [phone]

## 2011-08-01 NOTE — ED Notes (Signed)
Reports interm substernal cp x1 1/2 months; has been previously seen at OSH for same - referred to cards; saw PCP today - subsequently transferred to ED for further eval of cp; states pain interm. Rad. Into LUE and LLE; states pain is worse with exertion; pain free at this time

## 2011-08-01 NOTE — ED Notes (Signed)
Attempted to call report to [phone] - nurse unavailable at this time - will call back

## 2011-08-01 NOTE — ED Notes (Signed)
States has not taken antihypertensives in 3 months d/t financial constraints

## 2011-08-01 NOTE — Consult Note (Signed)
Admit date: 08/01/2011 Referring Physician: [facility] Primary Physician:  K. Renae Gloss, MD Primary Cardiologist : Wayne Both, III, MD Reason for Consultation:  Exertional chest pain and dyspnea  ASSESSMENT: 1. Chest discomfort and dyspnea with features c/w ischemic heart disease. R/O hypertensive heart disese  2. Untreated hypertension  3. Asthma  4. Tobacco use  PLAN:  1. Initiate antihypertensive therapy. Include beta blocker if he can tolerate. 2. Pharmacologic myocardial perfusion study in AM 3. 2 D-doppler echo to assess systolic and diastolic function 4. Smoking cessation 5. Aspirin    HPI: 6-8 month history of chest pain. The pain is of two types, a sharp/stabbing variety which lasts minutes and occurs randomly. A second type discomfort has occurred over the past 2-3 months. It is exertional and feels like pressure in the center of the chest. It is associated with dyspnea and relieved with rest.   PMH:   Past Medical History  Diagnosis Date  . Hypertension   . Asthma   . Bronchitis      PSH:  History reviewed. No pertinent past surgical history.  Allergies:  Review of patient's allergies indicates no known allergies. Prior to Admit Meds:   (Not in a hospital admission) Fam HX:   No family history on file. Social HX:    History   Social History  . Marital Status: Married    Spouse Name: N/A    Number of Children: N/A  . Years of Education: N/A   Occupational History  . Not on file.   Social History Main Topics  . Smoking status: Current Everyday Smoker  . Smokeless tobacco: Not on file  . Alcohol Use: No  . Drug Use: No  . Sexually Active:    Other Topics Concern  . Not on file   Social History Narrative  . No narrative on file     Review of Systems: No history of heart disease, diabetes, or hyperlipidemia.   Physical Exam: Blood pressure 160/101, pulse 65, temperature 97.7 F (36.5 C), temperature source Oral, resp. rate 18,  SpO2 99.00%. Weight change:   S4 gallop is audible but there is no murmur. No carotid bruit. Chest is clear. Neuro is normal. Pulses are 2+ and symmetric. Trace ankle edema is noted. Labs:   Lab Results  Component Value Date   WBC 8.7 08/01/2011   HGB 15.2 08/01/2011   HCT 43.1 08/01/2011   MCV 78.8 08/01/2011   PLT 225 08/01/2011    Lab 08/01/11 1714  NA 143  K 3.7  CL 105  CO2 27  BUN 15  CREATININE 1.32  CALCIUM 9.4  PROT 7.1  BILITOT 0.3  ALKPHOS 90  ALT 19  AST 13  GLUCOSE 124*   No results found for this basename: PTT   Lab Results  Component Value Date   INR 0.97 07/22/2011   Lab Results  Component Value Date   TROPONINI <0.30 08/01/2011      Radiology:  Dg Chest 2 View  08/01/2011  *RADIOLOGY REPORT*  Clinical Data: Chest pain off and on for 6 weeks, shortness of breath, history hypertension, asthma, bronchitis, smoking  CHEST - 2 VIEW  Comparison: 07/22/2011  Findings: Borderline enlargement of cardiac silhouette. Mediastinal contours and pulmonary vascularity normal. Lungs clear. No pleural effusion or pneumothorax. No acute osseous findings.  IMPRESSION: No acute abnormalities.  Original Report Authenticated By: Lollie Marrow, M.D.   EKG:  NSR with QS pattern V1-4 c/w ASMI, unknown age.  Lesleigh Noe 08/01/2011 7:48 PM

## 2011-08-02 ENCOUNTER — Inpatient Hospital Stay (HOSPITAL_COMMUNITY): Payer: BC Managed Care – PPO

## 2011-08-02 DIAGNOSIS — R0602 Shortness of breath: Secondary | ICD-10-CM

## 2011-08-02 DIAGNOSIS — F17200 Nicotine dependence, unspecified, uncomplicated: Secondary | ICD-10-CM

## 2011-08-02 DIAGNOSIS — R079 Chest pain, unspecified: Secondary | ICD-10-CM

## 2011-08-02 DIAGNOSIS — I10 Essential (primary) hypertension: Secondary | ICD-10-CM

## 2011-08-02 LAB — BASIC METABOLIC PANEL
BUN: 15 mg/dL (ref 6–23)
Chloride: 104 mEq/L (ref 96–112)
GFR calc Af Amer: 80 mL/min — ABNORMAL LOW (ref >90)
GFR calc non Af Amer: 69 mL/min — ABNORMAL LOW (ref >90)
Potassium: 3.7 mEq/L (ref 3.5–5.1)
Sodium: 140 mEq/L (ref 135–145)

## 2011-08-02 LAB — CBC
Hemoglobin: 15.5 g/dL (ref 13.0–17.0)
MCHC: 35.3 g/dL (ref 30.0–36.0)
WBC: 9.1 10*3/uL (ref 4.0–10.5)

## 2011-08-02 LAB — HEMOGLOBIN A1C
Hgb A1c MFr Bld: 6.4 % — ABNORMAL HIGH (ref <5.7)
Mean Plasma Glucose: 137 mg/dL — ABNORMAL HIGH (ref <117)
Mean Plasma Glucose: 140 mg/dL — ABNORMAL HIGH (ref <117)

## 2011-08-02 LAB — LIPID PANEL
Cholesterol: 199 mg/dL (ref 0–200)
HDL: 29 mg/dL — ABNORMAL LOW (ref >39)
Triglycerides: 279 mg/dL — ABNORMAL HIGH (ref <150)
VLDL: 56 mg/dL — ABNORMAL HIGH (ref 0–40)

## 2011-08-02 LAB — CARDIAC PANEL(CRET KIN+CKTOT+MB+TROPI)
Relative Index: 1.9 (ref 0.0–2.5)
Relative Index: 2.1 (ref 0.0–2.5)
Troponin I: <0.3 ng/mL (ref <0.30)

## 2011-08-02 LAB — GLUCOSE, CAPILLARY: Glucose-Capillary: 136 mg/dL — ABNORMAL HIGH (ref 70–99)

## 2011-08-02 LAB — HIGH SENSITIVITY CRP: CRP, High Sensitivity: 2.8 mg/L

## 2011-08-02 MED ORDER — METOPROLOL TARTRATE 50 MG PO TABS
50.0000 mg | ORAL_TABLET | Freq: Two times a day (BID) | ORAL | Status: DC
Start: 2011-08-02 — End: 2011-08-03
  Administered 2011-08-02 – 2011-08-03 (×2): 50 mg via ORAL
  Filled 2011-08-02 (×3): qty 1

## 2011-08-02 MED ORDER — ACETAMINOPHEN 325 MG PO TABS
ORAL_TABLET | ORAL | Status: AC
  Filled 2011-08-02: qty 2

## 2011-08-02 MED ORDER — TECHNETIUM TC 99M TETROFOSMIN IV KIT
10.0000 | PACK | Freq: Once | INTRAVENOUS | Status: AC | PRN
  Administered 2011-08-02: 10 via INTRAVENOUS

## 2011-08-02 MED ORDER — GLIPIZIDE 5 MG PO TABS
5.0000 mg | ORAL_TABLET | Freq: Two times a day (BID) | ORAL | Status: DC
  Administered 2011-08-02 – 2011-08-03 (×2): 5 mg via ORAL
  Filled 2011-08-02 (×4): qty 1

## 2011-08-02 MED ORDER — TECHNETIUM TC 99M TETROFOSMIN IV KIT
30.0000 | PACK | Freq: Once | INTRAVENOUS | Status: AC | PRN
  Administered 2011-08-02: 30 via INTRAVENOUS

## 2011-08-02 MED ORDER — SPIRONOLACTONE 25 MG PO TABS
25.0000 mg | ORAL_TABLET | Freq: Two times a day (BID) | ORAL | Status: DC
  Administered 2011-08-02 – 2011-08-03 (×3): 25 mg via ORAL
  Filled 2011-08-02 (×5): qty 1

## 2011-08-02 MED ORDER — INSULIN ASPART 100 UNIT/ML ~~LOC~~ SOLN
0.0000 [IU] | Freq: Three times a day (TID) | SUBCUTANEOUS | Status: DC
  Administered 2011-08-02: 1 [IU] via SUBCUTANEOUS

## 2011-08-02 MED ORDER — AMLODIPINE BESYLATE 5 MG PO TABS
5.0000 mg | ORAL_TABLET | Freq: Once | ORAL | Status: DC
  Administered 2011-08-02: 5 mg via ORAL
  Filled 2011-08-02: qty 1

## 2011-08-02 MED ORDER — AMLODIPINE BESYLATE 10 MG PO TABS
10.0000 mg | ORAL_TABLET | Freq: Every day | ORAL | Status: DC
  Administered 2011-08-03: 10 mg via ORAL
  Filled 2011-08-02: qty 1

## 2011-08-02 MED ORDER — REGADENOSON 0.4 MG/5ML IV SOLN
0.4000 mg | Freq: Once | INTRAVENOUS | Status: AC
  Administered 2011-08-02: 0.4 mg via INTRAVENOUS
  Filled 2011-08-02: qty 5

## 2011-08-02 MED ORDER — REGADENOSON 0.4 MG/5ML IV SOLN
INTRAVENOUS | Status: AC
  Filled 2011-08-02: qty 5

## 2011-08-02 NOTE — Progress Notes (Addendum)
Patient Name: Craig Knight Date of Encounter: 08/02/2011    SUBJECTIVE: He complains of headache. No chest discomfort or dyspnea. We just completed a nuclear myocardial perfusion study with pharmacologic stress. Lexiscan caused the patient to have chest tightness.  TELEMETRY:  Sinus bradycardia: Filed Vitals:   08/02/11 0925 08/02/11 1033 08/02/11 1035 08/02/11 1037  BP: 172/114 185/107 168/117 166/113  Pulse: 64 81 75 71  Temp:      TempSrc:      Resp:      Height:      Weight:      SpO2:        Intake/Output Summary (Last 24 hours) at 08/02/11 1047 Last data filed at 08/01/11 2322  Gross per 24 hour  Intake    283 ml  Output      0 ml  Net    283 ml    LABS: Basic Metabolic Panel:  Basename 08/02/11 0520 08/01/11 2121 08/01/11 1714  NA 140 -- 143  K 3.7 -- 3.7  CL 104 -- 105  CO2 25 -- 27  GLUCOSE 102* -- 124*  BUN 15 -- 15  CREATININE 1.23 1.39* --  CALCIUM 9.4 -- 9.4  MG -- -- --  PHOS -- -- --   CBC:  Basename 08/02/11 0520 08/01/11 2121 08/01/11 1714  WBC 9.1 9.3 --  NEUTROABS -- -- 5.1  HGB 15.5 14.3 --  HCT 43.9 41.8 --  MCV 79.0 78.9 --  PLT 207 211 --   Cardiac Enzymes:  Basename 08/02/11 0520 08/01/11 2119 08/01/11 1718  CKTOTAL 113 130 --  CKMB 2.2 2.3 --  CKMBINDEX -- -- --  TROPONINI <0.30 <0.30 <0.30   BNP: No components found with this basename: POCBNP:3 Hemoglobin A1C:  Basename 08/01/11 2121  HGBA1C 6.4*   Fasting Lipid Panel:  Basename 08/02/11 0520  CHOL 199  HDL 29*  LDLCALC 114*  TRIG 279*  CHOLHDL 6.9  LDLDIRECT --    Radiology/Studies:  Borderline cardiomegaly  Physical Exam: Blood pressure 166/113, pulse 71, temperature 98.3 F (36.8 C), temperature source Oral, resp. rate 18, height 5\' 7"  (1.702 m), weight 99.61 kg (219 lb 9.6 oz), SpO2 97.00%. Weight change:    S4 gallop. No murmur  ASSESSMENT:  1. Accelerated hypertension, with poor control to this point.  2. Chest tightness possibly related to  hypertension and LVH. Rule out CAD  3. Abnormal EKG with septal Q waves raising the possibility of prior septal infarct or hypertrophic cardiomyopathy  4. Socioeconomic issues.    Plan:  1. Increase amlodipine to 10 mg per day  2. Increase metoprolol to 50 mg twice per day if heart rate tolerates.  3. Add angiotensin system blockade or spironolactone.  4. May need cath on monday if ischemia on nuclear study.  Craig Knight 08/02/2011, 10:47 AM

## 2011-08-02 NOTE — Progress Notes (Signed)
  Echocardiogram 2D Echocardiogram has been performed.  Riana Tessmer, Real Cons 08/02/2011, 11:59 AM

## 2011-08-02 NOTE — Progress Notes (Signed)
Around 2208 pt started complaining of cp radiating to his left arm and hand. An EKG was done as well as vital signs. The pt's bp was 198/126. Pt's ekg did not vary from his previous ekg in the ed. Pt was given 1 mg morphine Iv as well as 5mg  of Hydralazine IV for his bp. Pt's bp came down to 167/114. Pt was also given Hydralazine PO along with metroprolol PO around 2300. Pt has been asymptomatic. MD on call was notified. Will continue to monitor the pt. Sanda Linger

## 2011-08-02 NOTE — Progress Notes (Signed)
The myocardial perfusion study is negative for ischemia.  Echo demonstrated severe concentric hypertrophy without regional wall motion abnormality.  Given the thickness of the left ventricular walls, hypertrophic cardiomyopathy cannot be excluded although given the nature of his blood pressure elevation I would assume that the hypertrophy is secondary.  I would advocate for better blood pressure control prior to discharge. Do not believe he will need catheterization. With adequate blood pressure control over months to a year or so, the hypertrophy should improve. With control of blood pressure his symptoms will also improved.

## 2011-08-02 NOTE — Progress Notes (Signed)
UR Completed Cristian Grieves Graves-Bigelow, RN,BSN [phone]  

## 2011-08-02 NOTE — Progress Notes (Signed)
Subjective: Still complaining of some chest tightness this am with stress test; no active CP now. Denies SOB. BP slightly better.  Objective: Vital signs in last 24 hours: Temp:  [97.6 F (36.4 C)-99.5 F (37.5 C)] 98.3 F (36.8 C) (06/07 0630) Pulse Rate:  [60-81] 60  (06/07 1340) Resp:  [18-26] 18  (06/07 0630) BP: (123-206)/(101-126) 165/108 mmHg (06/07 1340) SpO2:  [96 %-99 %] 97 % (06/07 0630) Weight:  [99.61 kg (219 lb 9.6 oz)] 99.61 kg (219 lb 9.6 oz) (06/06 2204) Weight change:  Last BM Date: 08/01/11  Intake/Output from previous day: 06/06 0701 - 06/07 0700 In: 283 [P.O.:280; I.V.:3] Out: -      Physical Exam: General: Alert, awake, oriented x3, in no acute distress. HEENT: No bruits, no goiter. Heart: Regular rate and rhythm, without murmurs, rubs, gallops. Lungs: Clear to auscultation bilaterally. Abdomen: Soft, nontender, nondistended, positive bowel sounds. Extremities: No clubbing cyanosis or edema with positive pedal pulses. Neuro: Grossly intact, nonfocal.   Lab Results: Basic Metabolic Panel:  Basename 08/02/11 0520 08/01/11 2121 08/01/11 1714  NA 140 -- 143  K 3.7 -- 3.7  CL 104 -- 105  CO2 25 -- 27  GLUCOSE 102* -- 124*  BUN 15 -- 15  CREATININE 1.23 1.39* --  CALCIUM 9.4 -- 9.4  MG -- -- --  PHOS -- -- --   Liver Function Tests:  Basename 08/01/11 1714  AST 13  ALT 19  ALKPHOS 90  BILITOT 0.3  PROT 7.1  ALBUMIN 4.2   CBC:  Basename 08/02/11 0520 08/01/11 2121 08/01/11 1714  WBC 9.1 9.3 --  NEUTROABS -- -- 5.1  HGB 15.5 14.3 --  HCT 43.9 41.8 --  MCV 79.0 78.9 --  PLT 207 211 --   Cardiac Enzymes:  Basename 08/02/11 0520 08/01/11 2119 08/01/11 1718  CKTOTAL 113 130 --  CKMB 2.2 2.3 --  CKMBINDEX -- -- --  TROPONINI <0.30 <0.30 <0.30   BNP:  Basename 08/01/11 2119 08/01/11 2029  PROBNP 30.1 30.0   Hemoglobin A1C:  Basename 08/02/11 0520  HGBA1C 6.5*   Fasting Lipid Panel:  Basename 08/02/11 0520  CHOL 199    HDL 29*  LDLCALC 114*  TRIG 279*  CHOLHDL 6.9  LDLDIRECT --   Thyroid Function Tests:  Basename 08/01/11 2033  TSH 1.405  T4TOTAL --  FREET4 --  T3FREE --  THYROIDAB --    Studies/Results: Dg Chest 2 View  08/01/2011  *RADIOLOGY REPORT*  Clinical Data: Chest pain off and on for 6 weeks, shortness of breath, history hypertension, asthma, bronchitis, smoking  CHEST - 2 VIEW  Comparison: 07/22/2011  Findings: Borderline enlargement of cardiac silhouette. Mediastinal contours and pulmonary vascularity normal. Lungs clear. No pleural effusion or pneumothorax. No acute osseous findings.  IMPRESSION: No acute abnormalities.  Original Report Authenticated By: Lollie Marrow, M.D.   Nm Myocar Multi W/spect W/wall Motion / Ef  08/02/2011  *RADIOLOGY REPORT*  Clinical Data:  46 year old male with chest pain, hypertension  MYOCARDIAL IMAGING WITH SPECT (REST AND PHARMACOLOGIC-STRESS) GATED LEFT VENTRICULAR WALL MOTION STUDY LEFT VENTRICULAR EJECTION FRACTION  Technique:  Resting myocardial SPECT imaging was initially performed after intravenous administration of radiopharmaceutical. Myocardial SPECT was subsequently performed after additional radiopharmaceutical injection during pharmacologic-stress supervised by the Cardiology staff.  Quantitative gated imaging was also performed to evaluate left ventricular wall motion, and estimate left ventricular ejection fraction.  Radiopharmaceutical:  Tc-64m Myoview at rest and during stress.  Comparison: none  Findings:  Technique: Study is adequate.  Perfusion:  There are no relative decreased counts on stress or rest to suggest reversible ischemia or infarction. There is left ventricular dilatation which is static from rest distress.  Wall motion:   Hypokinesia of the inferior wall and septal wall.  Left ventricular ejection fraction:  Calculated left ventricular ejection fraction =  51%  IMPRESSION:  1.  No reversible ischemia or infarction. 2.   Septal and inferior wall hypokinesia. 3.  Left ventricular dilatation. 4.  Left ventricular ejection fraction equal 51%  This was made a call report.  Original Report Authenticated By: Genevive Bi, M.D.    Medications: Scheduled Meds:   . acetaminophen      . amLODipine  10 mg Oral Daily  . aspirin  324 mg Oral Once  . aspirin  81 mg Oral Daily  . enoxaparin  40 mg Subcutaneous Q24H  . hydrochlorothiazide  25 mg Oral Daily  . metoprolol tartrate  50 mg Oral BID  . nicotine  14 mg Transdermal Daily  . regadenoson      . regadenoson  0.4 mg Intravenous Once  . sodium chloride  3 mL Intravenous Q12H  . spironolactone  25 mg Oral BID  . DISCONTD: amLODipine  5 mg Oral Daily  . DISCONTD: amLODipine  5 mg Oral Once  . DISCONTD: aspirin EC  81 mg Oral Daily  . DISCONTD: metoprolol tartrate  25 mg Oral BID   Continuous Infusions:   . sodium chloride 20 mL (08/02/11 1335)   PRN Meds:.acetaminophen, acetaminophen, hydrALAZINE, morphine injection, ondansetron (ZOFRAN) IV, ondansetron, oxyCODONE, senna-docusate, technetium tetrofosmin, technetium tetrofosmin  Assessment/Plan: #1 chest pain: most likely associated with uncontrolled HTN. Good EF on 2-D echo and at this point stress test scan is not demonstrating ischemic process. Per cardiology recommendations low sodium diet; better BP control and home tomorrow if stable. No cath indicated.  #2 uncontrolled hypertension: Patient has not been taking any medications. Will continue metoprolol 50 mg bid, HCT, amlodipine and spironolactone. Will follow BMET in am to ensure stability of electrolytes and also kidney function before discharge. Patient advised to follow a low sodium diet.  #3 tobacco abuse: Patient seems ready to quit. For now while in the hospital, will continue nicotine patch.   #4 hyperglycemia: will recommend lifestyle changes and will start glipizide; during follow up with PCP will need also metformin. Not started at this  point due to recent contrast given.  #5 DVT prophylaxis: Lovenox.     LOS: 1 day   Zeinab Rodwell [facility] [phone]  08/02/2011, 1:51 PM

## 2011-08-02 NOTE — Care Management Note (Signed)
    Page 1 of 1   08/02/2011     2:23:51 PM   CARE MANAGEMENT NOTE 08/02/2011  Patient:  Craig Knight, Craig Knight   Account Number:  [id number]  Date Initiated:  08/02/2011  Documentation initiated by:  GRAVES-BIGELOW,Mariann Palo  Subjective/Objective Assessment:   Pt admitted with cp and then. Pt had been noncomplinat with medications. In procedure at this time.     Action/Plan:   CM will talk to pt at later time.   Anticipated DC Date:  08/06/2011   Anticipated DC Plan:  HOME/SELF CARE      DC Planning Services  CM consult      Choice offered to / List presented to:             Status of service:  Completed, signed off Medicare Important Message given?   (If response is "NO", the following Medicare IM given date fields will be blank) Date Medicare IM given:   Date Additional Medicare IM given:    Discharge Disposition:  HOME/SELF CARE  Per UR Regulation:  Reviewed for med. necessity/level of care/duration of stay  If discussed at Long Length of Stay Meetings, dates discussed:    Comments:  08-02-11 [address] [place], [place] [phone] Pt plan for d/c over the weekend. Pt states he has insurance and will be able to afford generic meds. Cm went through med list with pt and most are on the $4.00 med list at [facility]. CM did mention that one med may cost 14.50 and he was ok with that. Pt states he understands the need to be compliant with meds once d/c. No further needs from CM at this time.

## 2011-08-03 DIAGNOSIS — R0602 Shortness of breath: Secondary | ICD-10-CM

## 2011-08-03 DIAGNOSIS — I10 Essential (primary) hypertension: Secondary | ICD-10-CM

## 2011-08-03 DIAGNOSIS — R079 Chest pain, unspecified: Secondary | ICD-10-CM

## 2011-08-03 DIAGNOSIS — F17200 Nicotine dependence, unspecified, uncomplicated: Secondary | ICD-10-CM

## 2011-08-03 LAB — GLUCOSE, CAPILLARY: Glucose-Capillary: 104 mg/dL — ABNORMAL HIGH (ref 70–99)

## 2011-08-03 LAB — BASIC METABOLIC PANEL
Chloride: 97 mEq/L (ref 96–112)
Creatinine, Ser: 1.22 mg/dL (ref 0.50–1.35)
GFR calc Af Amer: 81 mL/min — ABNORMAL LOW (ref >90)
Potassium: 3.5 mEq/L (ref 3.5–5.1)
Sodium: 136 mEq/L (ref 135–145)

## 2011-08-03 LAB — CBC
MCV: 78.7 fL (ref 78.0–100.0)
Platelets: 245 10*3/uL (ref 150–400)
RDW: 13.7 % (ref 11.5–15.5)
WBC: 9.9 10*3/uL (ref 4.0–10.5)

## 2011-08-03 MED ORDER — SPIRONOLACTONE 25 MG PO TABS: 25.0000 mg | ORAL_TABLET | Freq: Two times a day (BID) | ORAL | Status: DC

## 2011-08-03 MED ORDER — GLIPIZIDE 5 MG PO TABS: 5.0000 mg | ORAL_TABLET | Freq: Two times a day (BID) | ORAL | Status: DC

## 2011-08-03 MED ORDER — AMLODIPINE BESYLATE 10 MG PO TABS: 10.0000 mg | ORAL_TABLET | Freq: Every day | ORAL | Status: DC

## 2011-08-03 MED ORDER — ASPIRIN 81 MG PO CHEW: 81.0000 mg | CHEWABLE_TABLET | Freq: Every day | ORAL | Status: AC

## 2011-08-03 MED ORDER — HYDROCHLOROTHIAZIDE 25 MG PO TABS: 25.0000 mg | ORAL_TABLET | Freq: Every day | ORAL | Status: DC

## 2011-08-03 MED ORDER — METOPROLOL TARTRATE 50 MG PO TABS: 50.0000 mg | ORAL_TABLET | Freq: Two times a day (BID) | ORAL | Status: DC

## 2011-08-03 NOTE — Plan of Care (Signed)
Problem: Phase II Progression Outcomes Goal: Anginal pain relieved Outcome: Progressing Pt has been chest pain free since 08/01/11 around 2200. Goal: Stress Test if indicated Outcome: Completed/Met Date Met:  08/03/11 Stress test done on 08/02/2011 Goal: Cath/PCI Day Path if indicated Outcome: Not Applicable Date Met:  08/03/11 Not needed Goal: If positive for MI, change to MI Path Outcome: Not Applicable Date Met:  08/03/11 Pt's enzymes have been negative  Problem: Phase III Progression Outcomes Goal: Discharge plan remains appropriate-arrangements made Outcome: Progressing Pt is planned to be d/c'd on 08/03/2011 Goal: If positive for MI, change to MI Path Outcome: Not Applicable Date Met:  08/03/11 Pt is negative for a MI

## 2011-08-03 NOTE — Discharge Summary (Signed)
Physician Discharge Summary  Patient ID: Craig Knight MRN: [id number] DOB/AGE: [id number] 47 y.o.  Admit date: 08/01/2011 Discharge date: 08/03/2011  Primary Care Physician:  Dr. Andi Devon   Discharge Diagnoses:   1-Chest pain due to uncontrolled HTN 2-Tobacco abuse 3-Diabetes 4-Asthma 5-ARI   Medication List  As of 08/03/2011  9:57 AM   STOP taking these medications         BC HEADACHE POWDER PO         TAKE these medications         amLODipine 10 MG tablet   Commonly known as: NORVASC   Take 1 tablet (10 mg total) by mouth daily.      aspirin 81 MG chewable tablet   Chew 1 tablet (81 mg total) by mouth daily.      glipiZIDE 5 MG tablet   Commonly known as: GLUCOTROL   Take 1 tablet (5 mg total) by mouth 2 (two) times daily before a meal.      hydrochlorothiazide 25 MG tablet   Commonly known as: HYDRODIURIL   Take 1 tablet (25 mg total) by mouth daily.      metoprolol 50 MG tablet   Commonly known as: LOPRESSOR   Take 1 tablet (50 mg total) by mouth 2 (two) times daily.      spironolactone 25 MG tablet   Commonly known as: ALDACTONE   Take 1 tablet (25 mg total) by mouth 2 (two) times daily.             Disposition and Follow-up:  Patient discharge in stable and improved condition. No further CP and with negative stress test and 2-D echo showing severe LVH. Patient will need aggressive BP control and close follow up with PCP for further adjustments on his medication regimen. He was also found with diabetes and started on Glipizide. Follow up with PCP in 2 weeks; will be important to repeat BMET to follow electrolytes and kidney function.  Consults:   [facility]   Significant Diagnostic Studies:  Dg Chest 2 View  08/01/2011  *RADIOLOGY REPORT*  Clinical Data: Chest pain off and on for 6 weeks, shortness of breath, history hypertension, asthma, bronchitis, smoking  CHEST - 2 VIEW  Comparison: 07/22/2011  Findings: Borderline enlargement of  cardiac silhouette. Mediastinal contours and pulmonary vascularity normal. Lungs clear. No pleural effusion or pneumothorax. No acute osseous findings.  IMPRESSION: No acute abnormalities.  Original Report Authenticated By: Lollie Marrow, M.D.   Nm Myocar Multi W/spect W/wall Motion / Ef  08/02/2011  *RADIOLOGY REPORT*  Clinical Data:  46 year old male with chest pain, hypertension  MYOCARDIAL IMAGING WITH SPECT (REST AND PHARMACOLOGIC-STRESS) GATED LEFT VENTRICULAR WALL MOTION STUDY LEFT VENTRICULAR EJECTION FRACTION  Technique:  Resting myocardial SPECT imaging was initially performed after intravenous administration of radiopharmaceutical. Myocardial SPECT was subsequently performed after additional radiopharmaceutical injection during pharmacologic-stress supervised by the Cardiology staff.  Quantitative gated imaging was also performed to evaluate left ventricular wall motion, and estimate left ventricular ejection fraction.  Radiopharmaceutical:  Tc-33m Myoview at rest and during stress.  Comparison: none  Findings:  Technique: Study is adequate.  Perfusion:  There are no relative decreased counts on stress or rest to suggest reversible ischemia or infarction. There is left ventricular dilatation which is static from rest distress.  Wall motion:   Hypokinesia of the inferior wall and septal wall.  Left ventricular ejection fraction:  Calculated left ventricular ejection fraction =  51%  IMPRESSION:  1.  No reversible ischemia or infarction. 2.  Septal and inferior wall hypokinesia. 3.  Left ventricular dilatation. 4.  Left ventricular ejection fraction equal 51%  This was made a call report.  Original Report Authenticated By: Genevive Bi, M.D.    Brief H and P: For complete details please refer to admission H and P, but in brief Patient is a pleasant 46 year old [place] American man with no significant past medical history, although he has not had routine medical followup, who presents  with complaints of substernal chest pain radiating to his left arm. Chest pain has been present on and off for the past month. He in fact went to [facility] on May 28 and it was recommended that he be admitted to the hospital however he decided to leave AGAINST MEDICAL ADVICE. Chest pain usually happens with exertion, although he has noted occasions when the chest pain has come on while watching TV. He has not noticed any alleviating or aggravating factors. His coronary artery disease risk factors include uncontrolled hypertension, tobacco abuse, diabetes and gender.     Hospital Course:  #1 chest pain: most likely associated with uncontrolled HTN. Good EF on 2-D echo and at this point stress test scan is not demonstrating ischemic process. Per cardiology recommendations low sodium diet; better BP control and stop smoking.   #2 uncontrolled hypertension: Patient has not been taking any medications. Will continue metoprolol 50 mg bid, HCTZ, amlodipine and spironolactone. Will instruct him to follow a low sodium diet and to quit smoking.  #3 tobacco abuse: Patient seems ready to quit. He will use nicotine patch as an outpatient.  #4 Diabetes: A1C 6.5; will recommend lifestyle changes and will start glipizide BID; during follow up with PCP might also need metformin. Not started at this point due to recent contrast given and borderline renal insufficiency.  #5 Asthma: stable. Encourage to quit smoking.  #6 ARI: due to uncontrolled HTN. Once BP was controlled; Cr drop into normal range; GFR still a little off. Will avoid nephrotoxic agents; advised for low sodium diet and follow BMET during next PCP visit.    Time spent on Discharge: 40 minutes  Signed: Senora Lacson 08/03/2011, 9:57 AM

## 2011-08-03 NOTE — ED Provider Notes (Signed)
Medical screening examination/treatment/procedure(s) were performed by non-physician practitioner and as supervising physician I was immediately available for consultation/collaboration.   Talaya Lamprecht A Timira Bieda, MD 08/03/11 0746 

## 2011-11-01 ENCOUNTER — Encounter (HOSPITAL_COMMUNITY): Payer: Self-pay | Admitting: Emergency Medicine

## 2011-11-01 ENCOUNTER — Emergency Department (HOSPITAL_COMMUNITY)
Admission: EM | Admit: 2011-11-01 | Discharge: 2011-11-01 | Discharge status: Against Medical Advice | Payer: BC Managed Care – PPO | Attending: Emergency Medicine | Admitting: Emergency Medicine

## 2011-11-01 ENCOUNTER — Emergency Department (HOSPITAL_COMMUNITY): Payer: BC Managed Care – PPO

## 2011-11-01 DIAGNOSIS — R079 Chest pain, unspecified: Secondary | ICD-10-CM

## 2011-11-01 DIAGNOSIS — R51 Headache: Secondary | ICD-10-CM | POA: Insufficient documentation

## 2011-11-01 LAB — POCT I-STAT TROPONIN I: Troponin i, poc: 0.01 ng/mL (ref 0.00–0.08)

## 2011-11-01 LAB — CBC
MCH: 28.1 pg (ref 26.0–34.0)
Platelets: 251 10*3/uL (ref 150–400)
RBC: 5.27 MIL/uL (ref 4.22–5.81)

## 2011-11-01 LAB — BASIC METABOLIC PANEL
CO2: 23 mEq/L (ref 19–32)
Calcium: 9.8 mg/dL (ref 8.4–10.5)
GFR calc non Af Amer: 71 mL/min — ABNORMAL LOW (ref >90)
Sodium: 140 mEq/L (ref 135–145)

## 2011-11-01 IMAGING — CR DG CHEST 2V
2 series · 2 of 2 positions shown · non-contrast
Comparison: PA and lateral chest [DATE] and [DATE].

CLINICAL DATA: Chest pain, dizziness and headache.

CHEST - 2 VIEW

[w chest pa]
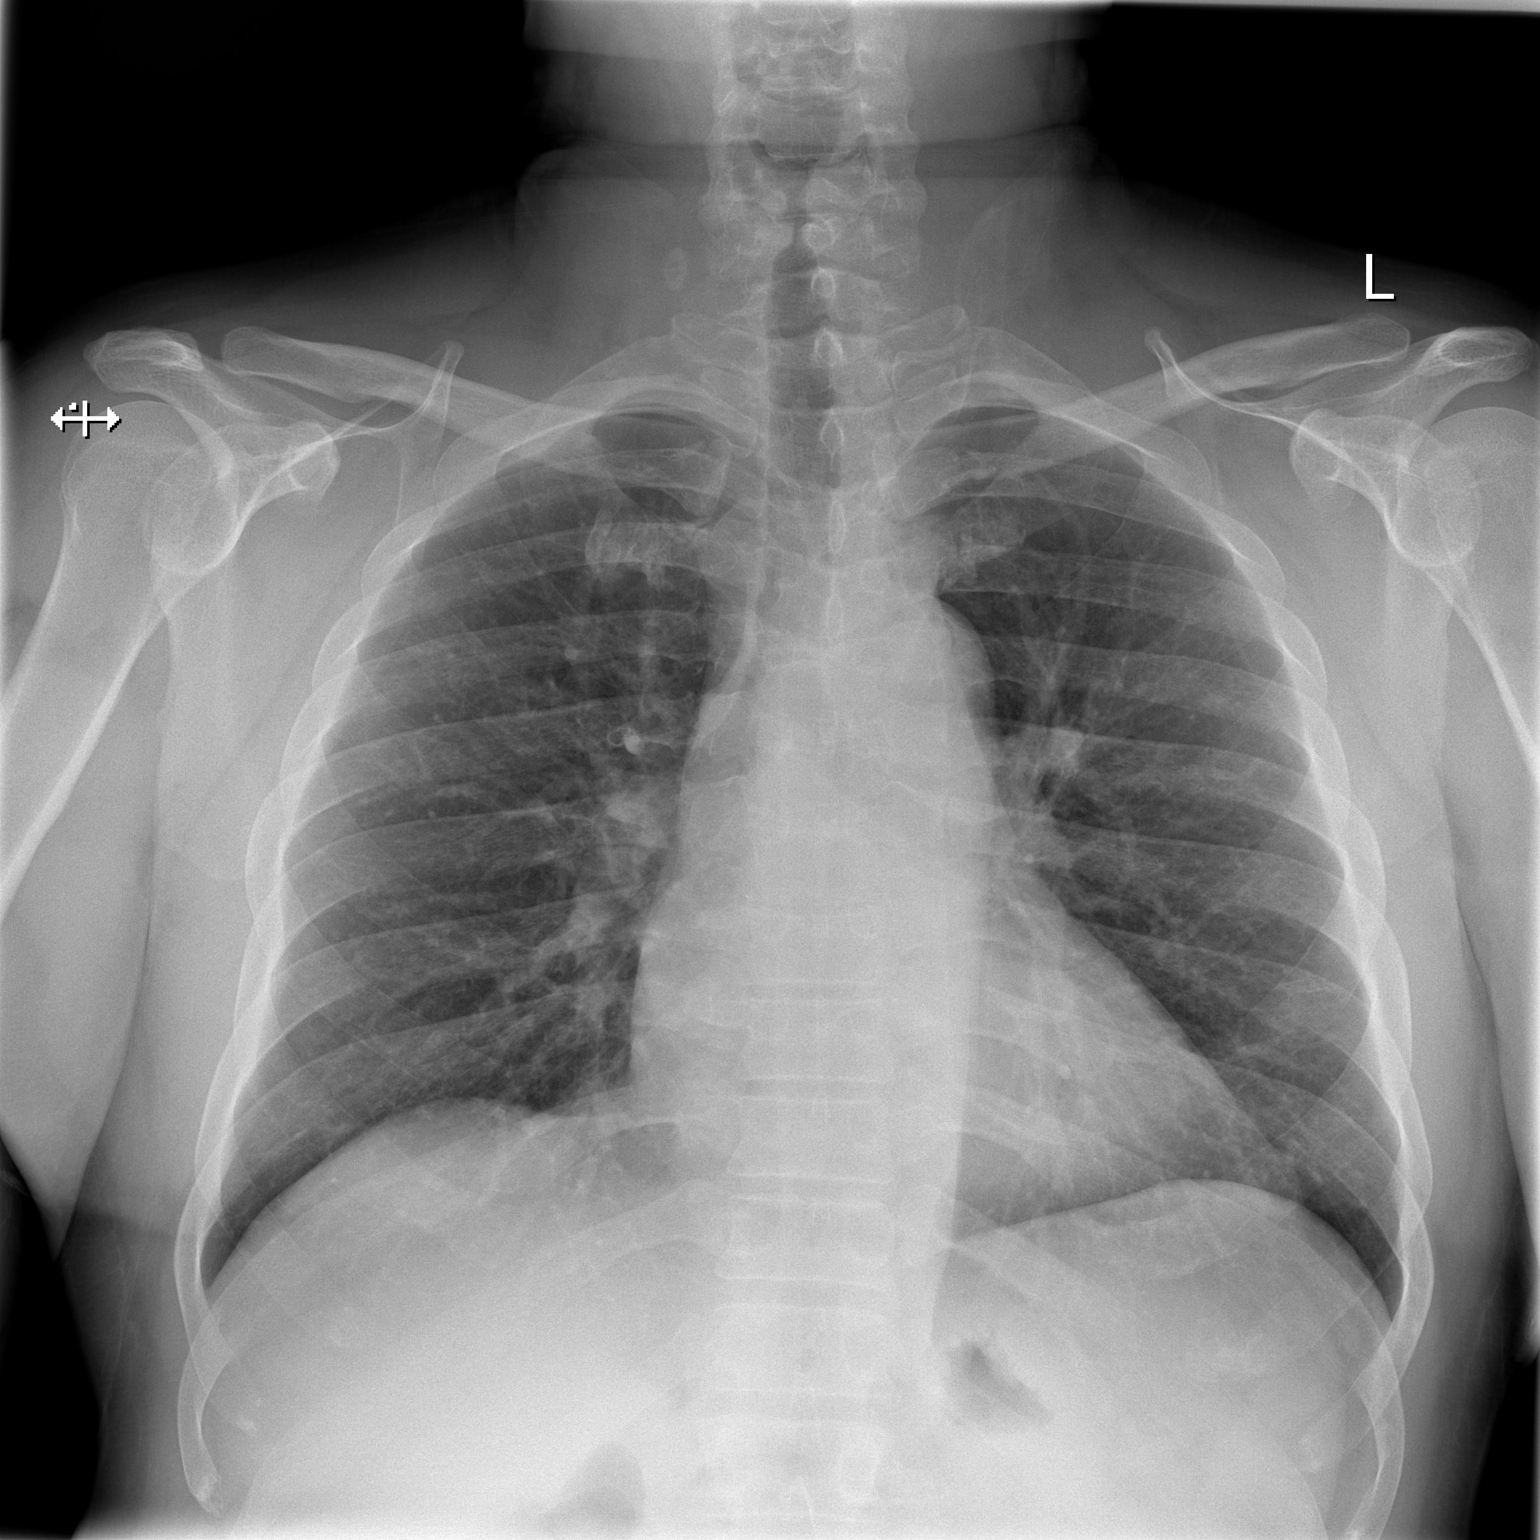

[w chest lat]
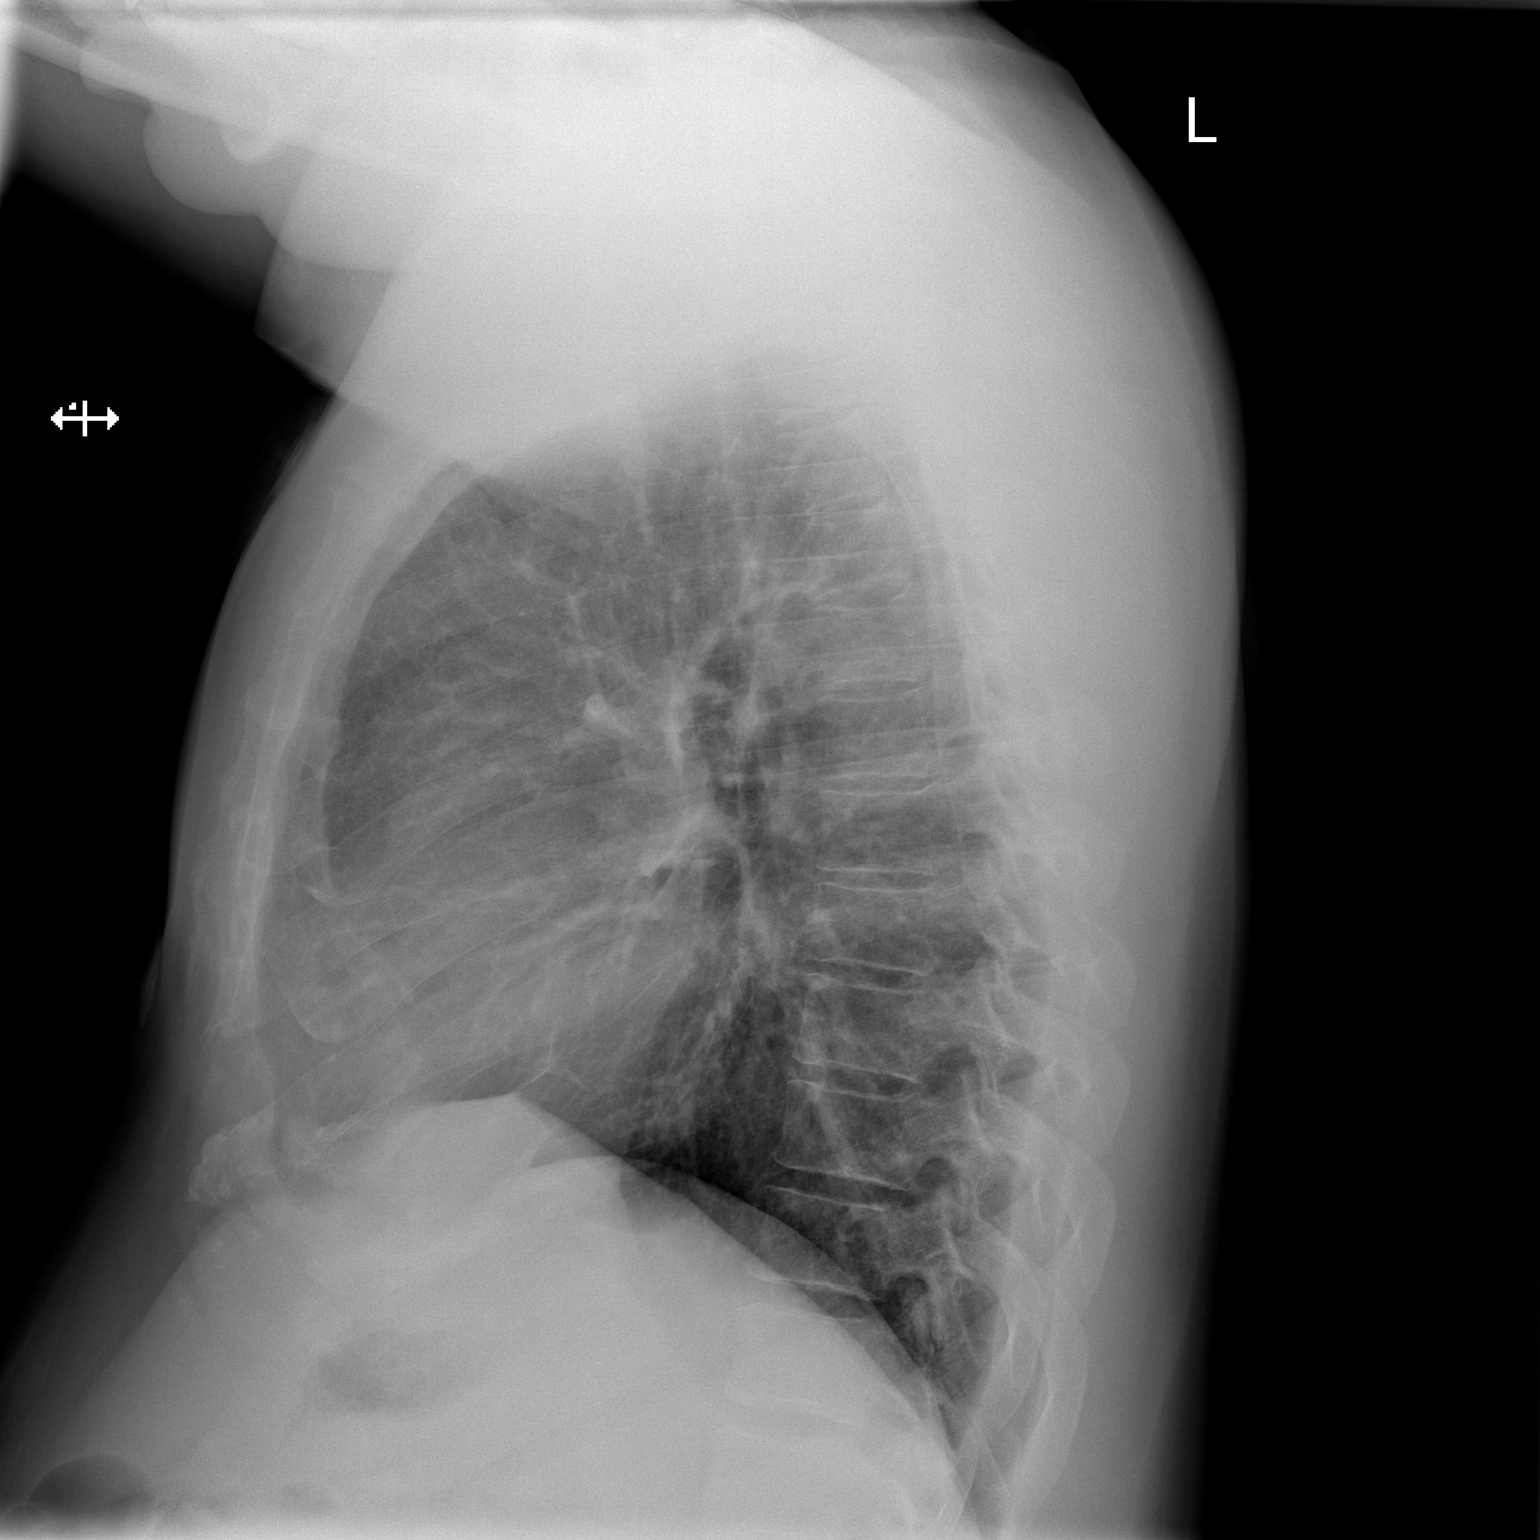

[2 of 2 positions shown; findings below may reference images not displayed]

FINDINGS: Lungs are clear.  Heart size is normal.  No pneumothorax
or pleural fluid.  No focal bony abnormality.
IMPRESSION: Negative chest.

## 2011-11-01 NOTE — ED Notes (Signed)
Pt c/o midsternal CP and HA starting today; pt sts hx of similar with "inflammation in heart"

## 2011-11-01 NOTE — ED Notes (Signed)
Called pt in lobby, not available to name call at this time.

## 2011-11-01 NOTE — ED Notes (Signed)
This patient i have called 6 times.  Patient unable to locate for another set of vitals.

## 2011-11-01 NOTE — ED Notes (Signed)
Called pt in lobby, not responding to name call at this time

## 2012-07-11 ENCOUNTER — Encounter (HOSPITAL_COMMUNITY): Payer: Self-pay

## 2012-07-11 ENCOUNTER — Emergency Department (HOSPITAL_COMMUNITY)
Admission: EM | Admit: 2012-07-11 | Discharge: 2012-07-11 | Disposition: A | Discharge status: Home / Self Care | Payer: BC Managed Care – PPO | Attending: Emergency Medicine | Admitting: Emergency Medicine

## 2012-07-11 DIAGNOSIS — J45909 Unspecified asthma, uncomplicated: Secondary | ICD-10-CM | POA: Insufficient documentation

## 2012-07-11 DIAGNOSIS — F17200 Nicotine dependence, unspecified, uncomplicated: Secondary | ICD-10-CM | POA: Insufficient documentation

## 2012-07-11 DIAGNOSIS — I10 Essential (primary) hypertension: Secondary | ICD-10-CM | POA: Insufficient documentation

## 2012-07-11 DIAGNOSIS — Z7982 Long term (current) use of aspirin: Secondary | ICD-10-CM | POA: Insufficient documentation

## 2012-07-11 DIAGNOSIS — S058X9A Other injuries of unspecified eye and orbit, initial encounter: Secondary | ICD-10-CM | POA: Insufficient documentation

## 2012-07-11 DIAGNOSIS — Z79899 Other long term (current) drug therapy: Secondary | ICD-10-CM | POA: Insufficient documentation

## 2012-07-11 DIAGNOSIS — IMO0002 Reserved for concepts with insufficient information to code with codable children: Secondary | ICD-10-CM | POA: Insufficient documentation

## 2012-07-11 DIAGNOSIS — Y9389 Activity, other specified: Secondary | ICD-10-CM | POA: Insufficient documentation

## 2012-07-11 DIAGNOSIS — Y929 Unspecified place or not applicable: Secondary | ICD-10-CM | POA: Insufficient documentation

## 2012-07-11 DIAGNOSIS — S0502XA Injury of conjunctiva and corneal abrasion without foreign body, left eye, initial encounter: Secondary | ICD-10-CM

## 2012-07-11 MED ORDER — FLUORESCEIN SODIUM 1 MG OP STRP
ORAL_STRIP | OPHTHALMIC | Status: AC
  Filled 2012-07-11: qty 2

## 2012-07-11 MED ORDER — TETRACAINE HCL 0.5 % OP SOLN
OPHTHALMIC | Status: AC
  Filled 2012-07-11: qty 2

## 2012-07-11 MED ORDER — GENTAMICIN SULFATE 0.3 % OP SOLN: 1.0000 [drp] | OPHTHALMIC | Status: DC

## 2012-07-11 MED ORDER — OXYCODONE-ACETAMINOPHEN 5-325 MG PO TABS: 2.0000 | ORAL_TABLET | ORAL | Status: DC | PRN

## 2012-07-11 NOTE — ED Notes (Signed)
Pt unable to keep eye open to perform vision assessment, pt appears very anxious.

## 2012-07-11 NOTE — ED Provider Notes (Signed)
History  This chart was scribed for Geoffery Lyons, MD by Bennett Scrape, ED Scribe. This patient was seen in room APA03/APA03 and the patient's care was started at 3:00 PM.  CSN: [id number]  Arrival date & time 07/11/12  1414   First MD Initiated Contact with Patient 07/11/12 1457      Chief Complaint  Patient presents with  . Eye Injury     The history is provided by the patient. No language interpreter was used.    HPI Comments: Craig Knight is a 47 y.o. male who presents to the Emergency Department complaining of a left eye injury that occurred when he was hammering a nail into a piece of wood 2 hours ago. Pt states that he missed part of the nail while hammering make it fly up and hit him in the eye. He denies any changes in vision but states that the pain is worse with holding the eye open. He denies any other injuries. He has a h/o HTN and asthma. He is a current everyday smoker and occasional alcohol user.  Past Medical History  Diagnosis Date  . Hypertension   . Asthma   . Bronchitis     History reviewed. No pertinent past surgical history.  No family history on file.  History  Substance Use Topics  . Smoking status: Current Every Day Smoker    Types: Cigarettes  . Smokeless tobacco: Not on file  . Alcohol Use: Yes     Comment: occ      Review of Systems  A complete 10 system review of systems was obtained and all systems are negative except as noted in the HPI and PMH.   Allergies  Review of patient's allergies indicates no known allergies.  Home Medications   Current Outpatient Rx  Name  Route  Sig  Dispense  Refill  . aspirin 81 MG chewable tablet   Oral   Chew 1 tablet (81 mg total) by mouth daily.         Marland Kitchen amLODipine (NORVASC) 10 MG tablet   Oral   Take 1 tablet (10 mg total) by mouth daily.   30 tablet   1   . glipiZIDE (GLUCOTROL) 5 MG tablet   Oral   Take 1 tablet (5 mg total) by mouth 2 (two) times daily before a meal.   60  tablet   1   . hydrochlorothiazide (HYDRODIURIL) 25 MG tablet   Oral   Take 1 tablet (25 mg total) by mouth daily.   30 tablet   1   . metoprolol (LOPRESSOR) 50 MG tablet   Oral   Take 1 tablet (50 mg total) by mouth 2 (two) times daily.   60 tablet   1   . spironolactone (ALDACTONE) 25 MG tablet   Oral   Take 1 tablet (25 mg total) by mouth 2 (two) times daily.   60 tablet   1     Triage Vitals: BP 151/101  Pulse 77  Temp(Src) 98 F (36.7 C) (Oral)  Resp 20  Ht 5\' 8"  (1.727 m)  Wt 210 lb (95.255 kg)  BMI 31.94 kg/m2  SpO2 100%  Physical Exam  Nursing note and vitals reviewed. Constitutional: He is oriented to person, place, and time. He appears well-developed and well-nourished. No distress.  HENT:  Head: Normocephalic and atraumatic.  Eyes: EOM are normal. Pupils are equal, round, and reactive to light.  corneal abrasion to the left eye at about 3 o'clock, anterior  chamber of the left eye is clear, no hyphema. seidel test is normal  Neck: Neck supple. No tracheal deviation present.  Cardiovascular: Normal rate.   Pulmonary/Chest: Effort normal. No respiratory distress.  Musculoskeletal: Normal range of motion.  Neurological: He is alert and oriented to person, place, and time.  Skin: Skin is warm and dry.  Psychiatric: He has a normal mood and affect. His behavior is normal.    ED Course  Procedures (including critical care time)  DIAGNOSTIC STUDIES: Oxygen Saturation is 100% on room air, normal by my interpretation.    COORDINATION OF CARE: 3:16 PM-Administered two drops of tetracaine solution to the left eye with pt's approval. Pt reported improvement in pain.  3:17 PM- Administered two more drops of tetracaine with pt's permission.   3:18 PM-Administered fluorescein strip to the left eye.   3:23 PM- Advised pt that he has a left corneal abrasion. Discussed discharge plan which includes pain medication and antibiotic ointment with pt at bedside and pt  agreed to plan.   Labs Reviewed - No data to display No results found.   No diagnosis found.    MDM  Corneal abrasion visible to naked eye and confirmed with fluorescein staining.  The is no evidence for globe rupture, hyphema, or other emergent injury.  Will discharge with pain meds, gent drops.  Return prn.      I personally performed the services described in this documentation, which was scribed in my presence. The recorded information has been reviewed and is accurate.      Geoffery Lyons, MD 07/12/12 [phone]

## 2012-07-11 NOTE — ED Notes (Signed)
Pt was hammering nails today around 1pm, was hit in the left eye w/ nail. Unable to open the eye to do visual acuity.

## 2013-04-15 ENCOUNTER — Encounter (HOSPITAL_COMMUNITY): Payer: Self-pay | Admitting: Emergency Medicine

## 2013-04-15 ENCOUNTER — Emergency Department (HOSPITAL_COMMUNITY): Payer: BC Managed Care – PPO

## 2013-04-15 ENCOUNTER — Emergency Department (HOSPITAL_COMMUNITY)
Admission: EM | Admit: 2013-04-15 | Discharge: 2013-04-15 | Disposition: A | Discharge status: Home / Self Care | Payer: BC Managed Care – PPO | Attending: Emergency Medicine | Admitting: Emergency Medicine

## 2013-04-15 DIAGNOSIS — R5381 Other malaise: Secondary | ICD-10-CM | POA: Insufficient documentation

## 2013-04-15 DIAGNOSIS — J45901 Unspecified asthma with (acute) exacerbation: Secondary | ICD-10-CM | POA: Insufficient documentation

## 2013-04-15 DIAGNOSIS — F17200 Nicotine dependence, unspecified, uncomplicated: Secondary | ICD-10-CM | POA: Insufficient documentation

## 2013-04-15 DIAGNOSIS — R5383 Other fatigue: Secondary | ICD-10-CM

## 2013-04-15 DIAGNOSIS — I10 Essential (primary) hypertension: Secondary | ICD-10-CM | POA: Insufficient documentation

## 2013-04-15 DIAGNOSIS — H538 Other visual disturbances: Secondary | ICD-10-CM | POA: Insufficient documentation

## 2013-04-15 DIAGNOSIS — K089 Disorder of teeth and supporting structures, unspecified: Secondary | ICD-10-CM | POA: Insufficient documentation

## 2013-04-15 DIAGNOSIS — K0889 Other specified disorders of teeth and supporting structures: Secondary | ICD-10-CM

## 2013-04-15 DIAGNOSIS — Z79899 Other long term (current) drug therapy: Secondary | ICD-10-CM | POA: Insufficient documentation

## 2013-04-15 LAB — TROPONIN I

## 2013-04-15 LAB — CBC WITH DIFFERENTIAL/PLATELET
BASOS PCT: 1 % (ref 0–1)
Basophils Absolute: 0 10*3/uL (ref 0.0–0.1)
Eosinophils Absolute: 0.2 10*3/uL (ref 0.0–0.7)
Eosinophils Relative: 3 % (ref 0–5)
HCT: 45.8 % (ref 39.0–52.0)
Hemoglobin: 15.5 g/dL (ref 13.0–17.0)
Lymphocytes Relative: 25 % (ref 12–46)
Lymphs Abs: 1.9 10*3/uL (ref 0.7–4.0)
MCH: 27.3 pg (ref 26.0–34.0)
MCHC: 33.8 g/dL (ref 30.0–36.0)
MCV: 80.6 fL (ref 78.0–100.0)
Monocytes Absolute: 0.6 10*3/uL (ref 0.1–1.0)
Monocytes Relative: 8 % (ref 3–12)
NEUTROS ABS: 4.8 10*3/uL (ref 1.7–7.7)
NEUTROS PCT: 64 % (ref 43–77)
PLATELETS: 225 10*3/uL (ref 150–400)
RBC: 5.68 MIL/uL (ref 4.22–5.81)
RDW: 14.1 % (ref 11.5–15.5)
WBC: 7.5 10*3/uL (ref 4.0–10.5)

## 2013-04-15 LAB — COMPREHENSIVE METABOLIC PANEL
ALBUMIN: 3.7 g/dL (ref 3.5–5.2)
ALK PHOS: 102 U/L (ref 39–117)
ALT: 22 U/L (ref 0–53)
AST: 14 U/L (ref 0–37)
BUN: 17 mg/dL (ref 6–23)
CO2: 24 mEq/L (ref 19–32)
Calcium: 9 mg/dL (ref 8.4–10.5)
Chloride: 105 mEq/L (ref 96–112)
Creatinine, Ser: 1.25 mg/dL (ref 0.50–1.35)
GFR calc Af Amer: 77 mL/min — ABNORMAL LOW (ref >90)
GFR calc non Af Amer: 67 mL/min — ABNORMAL LOW (ref >90)
Glucose, Bld: 121 mg/dL — ABNORMAL HIGH (ref 70–99)
POTASSIUM: 4.1 meq/L (ref 3.7–5.3)
SODIUM: 140 meq/L (ref 137–147)
Total Bilirubin: 0.2 mg/dL — ABNORMAL LOW (ref 0.3–1.2)
Total Protein: 7 g/dL (ref 6.0–8.3)

## 2013-04-15 IMAGING — CR DG CHEST 2V
3 series · 3 of 3 positions shown · non-contrast
Comparison: DG CHEST 2V dated [DATE]

CLINICAL DATA: Chest pain .

EXAM:
CHEST  2 VIEW

[view not recorded (1 of 3)]
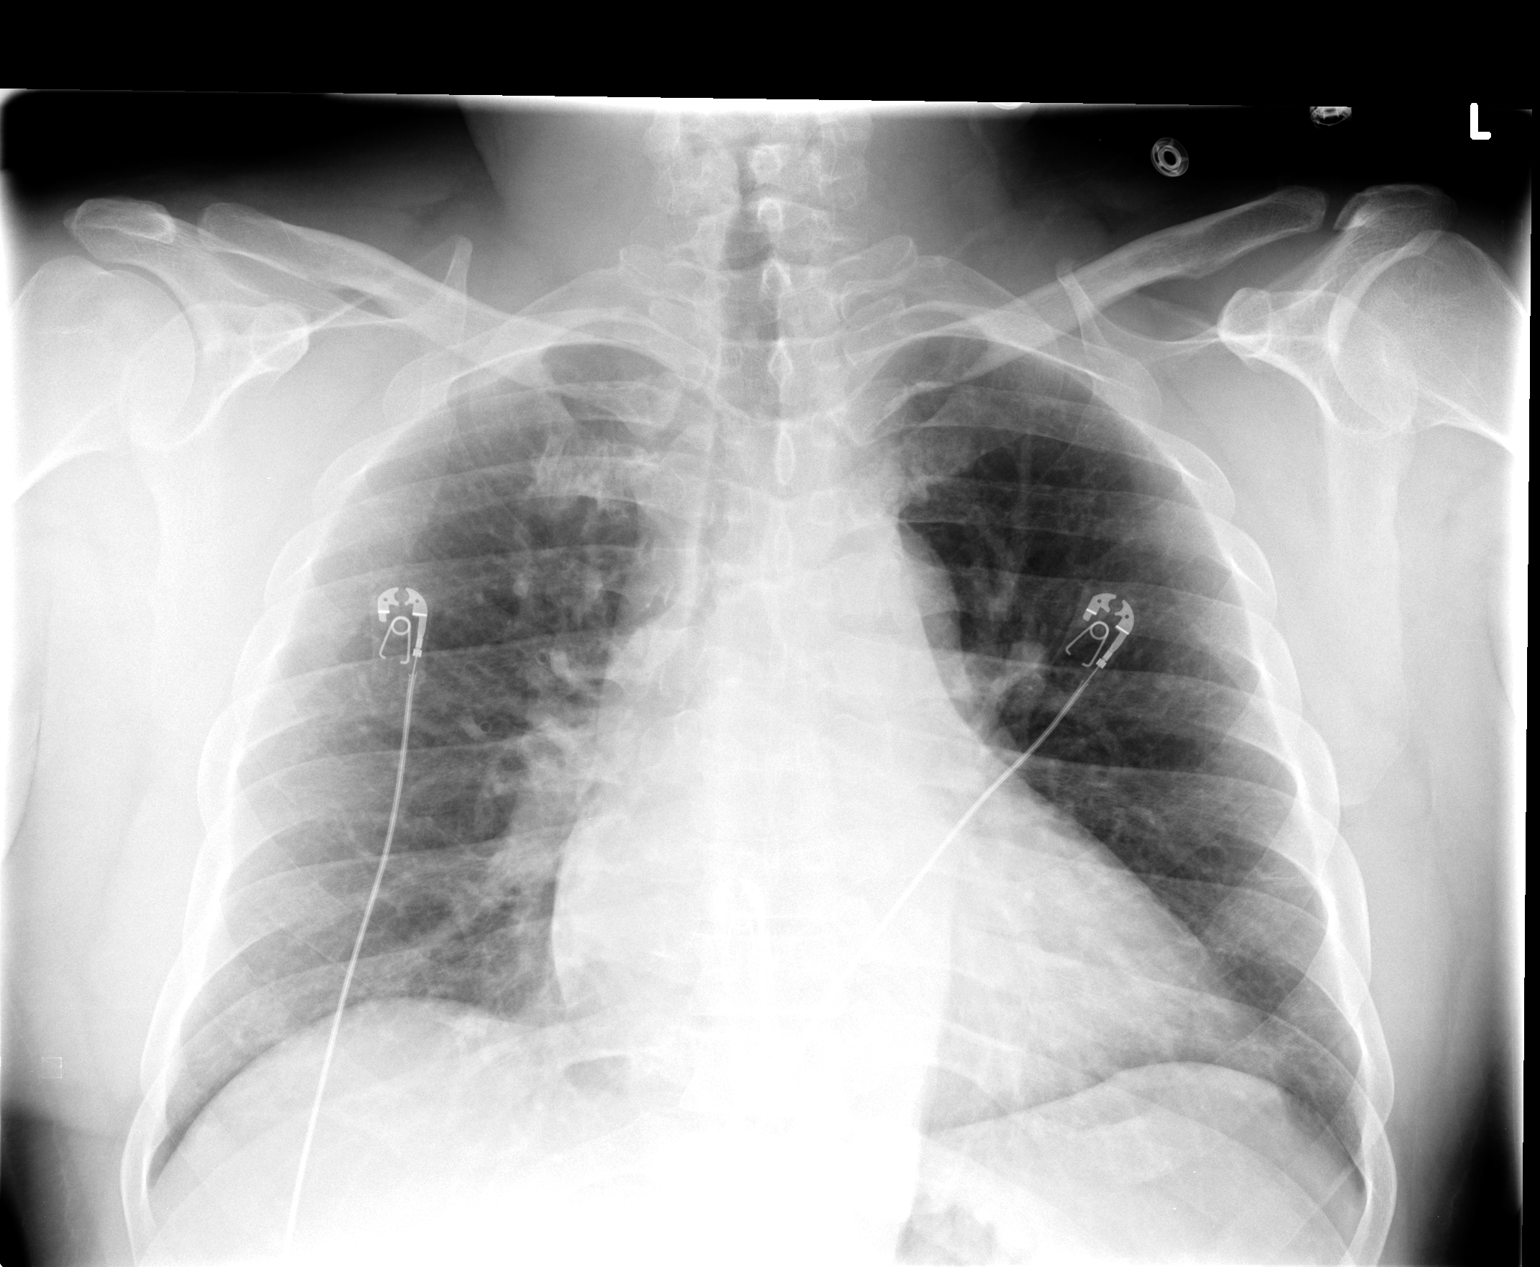

[view not recorded (2 of 3)]
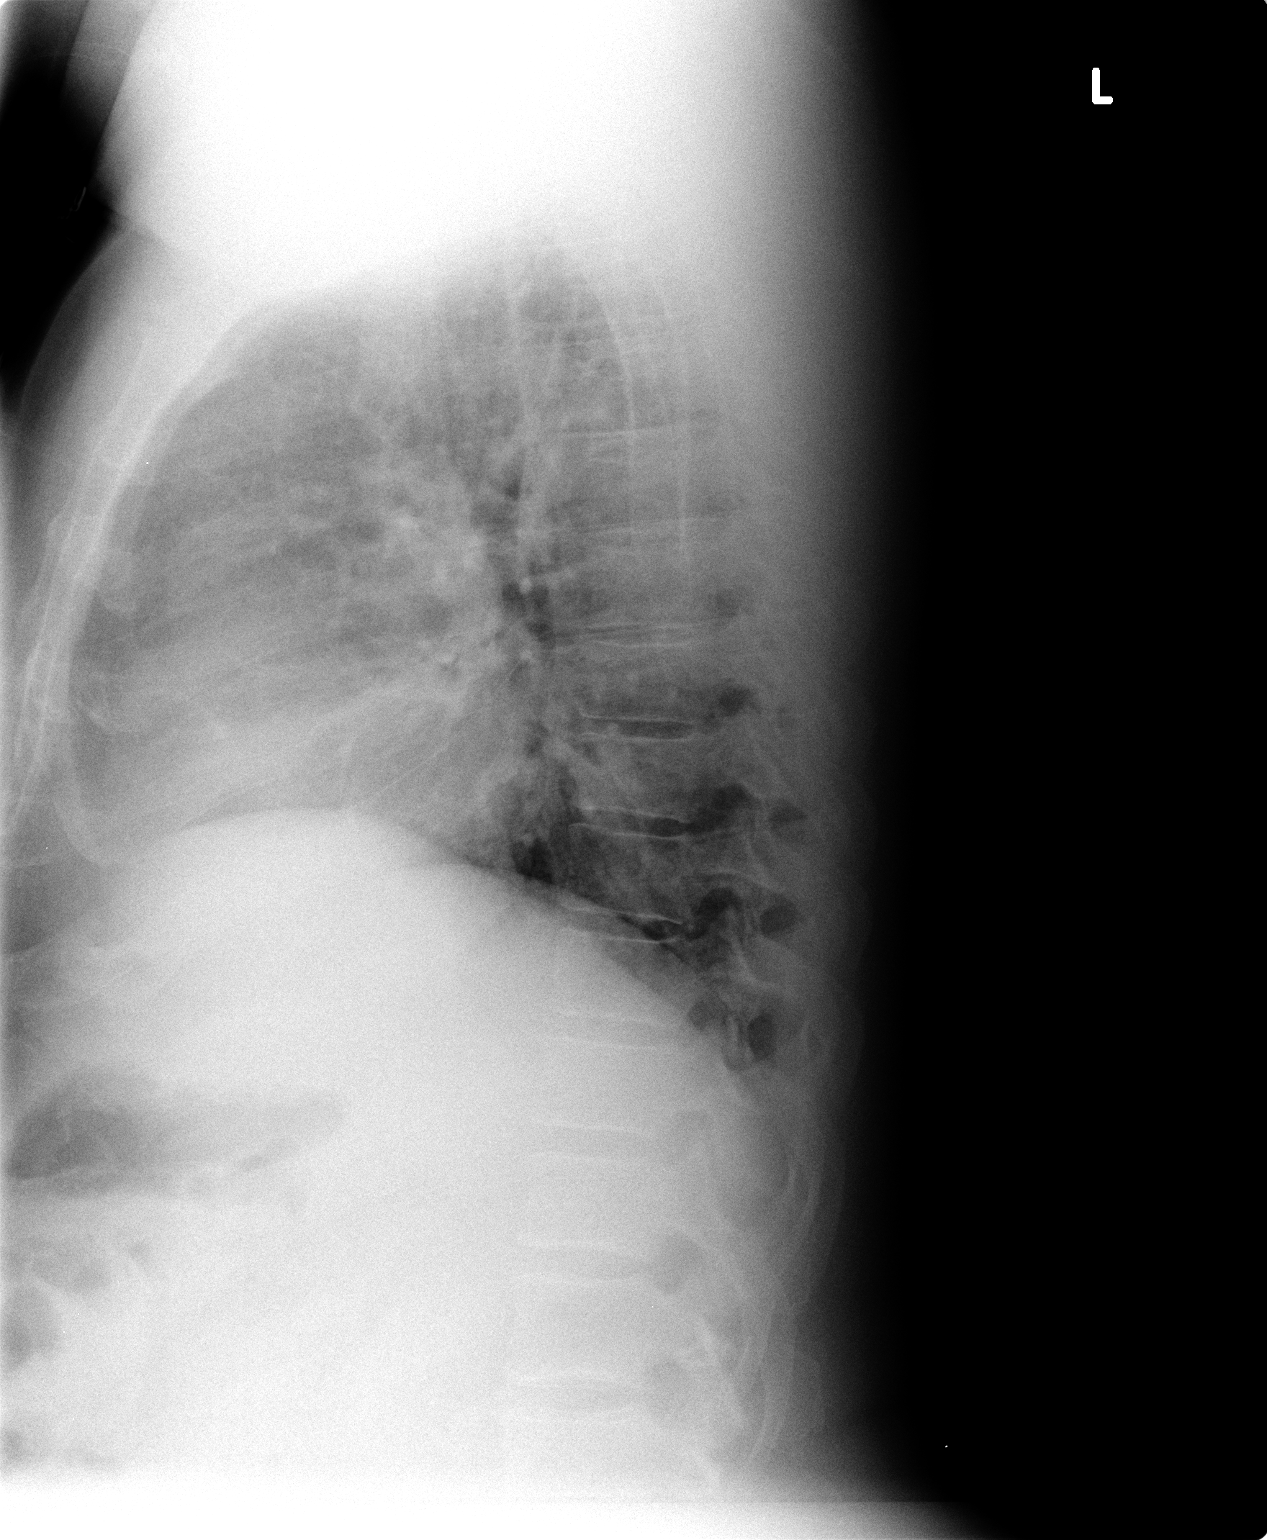

[view not recorded (3 of 3)]
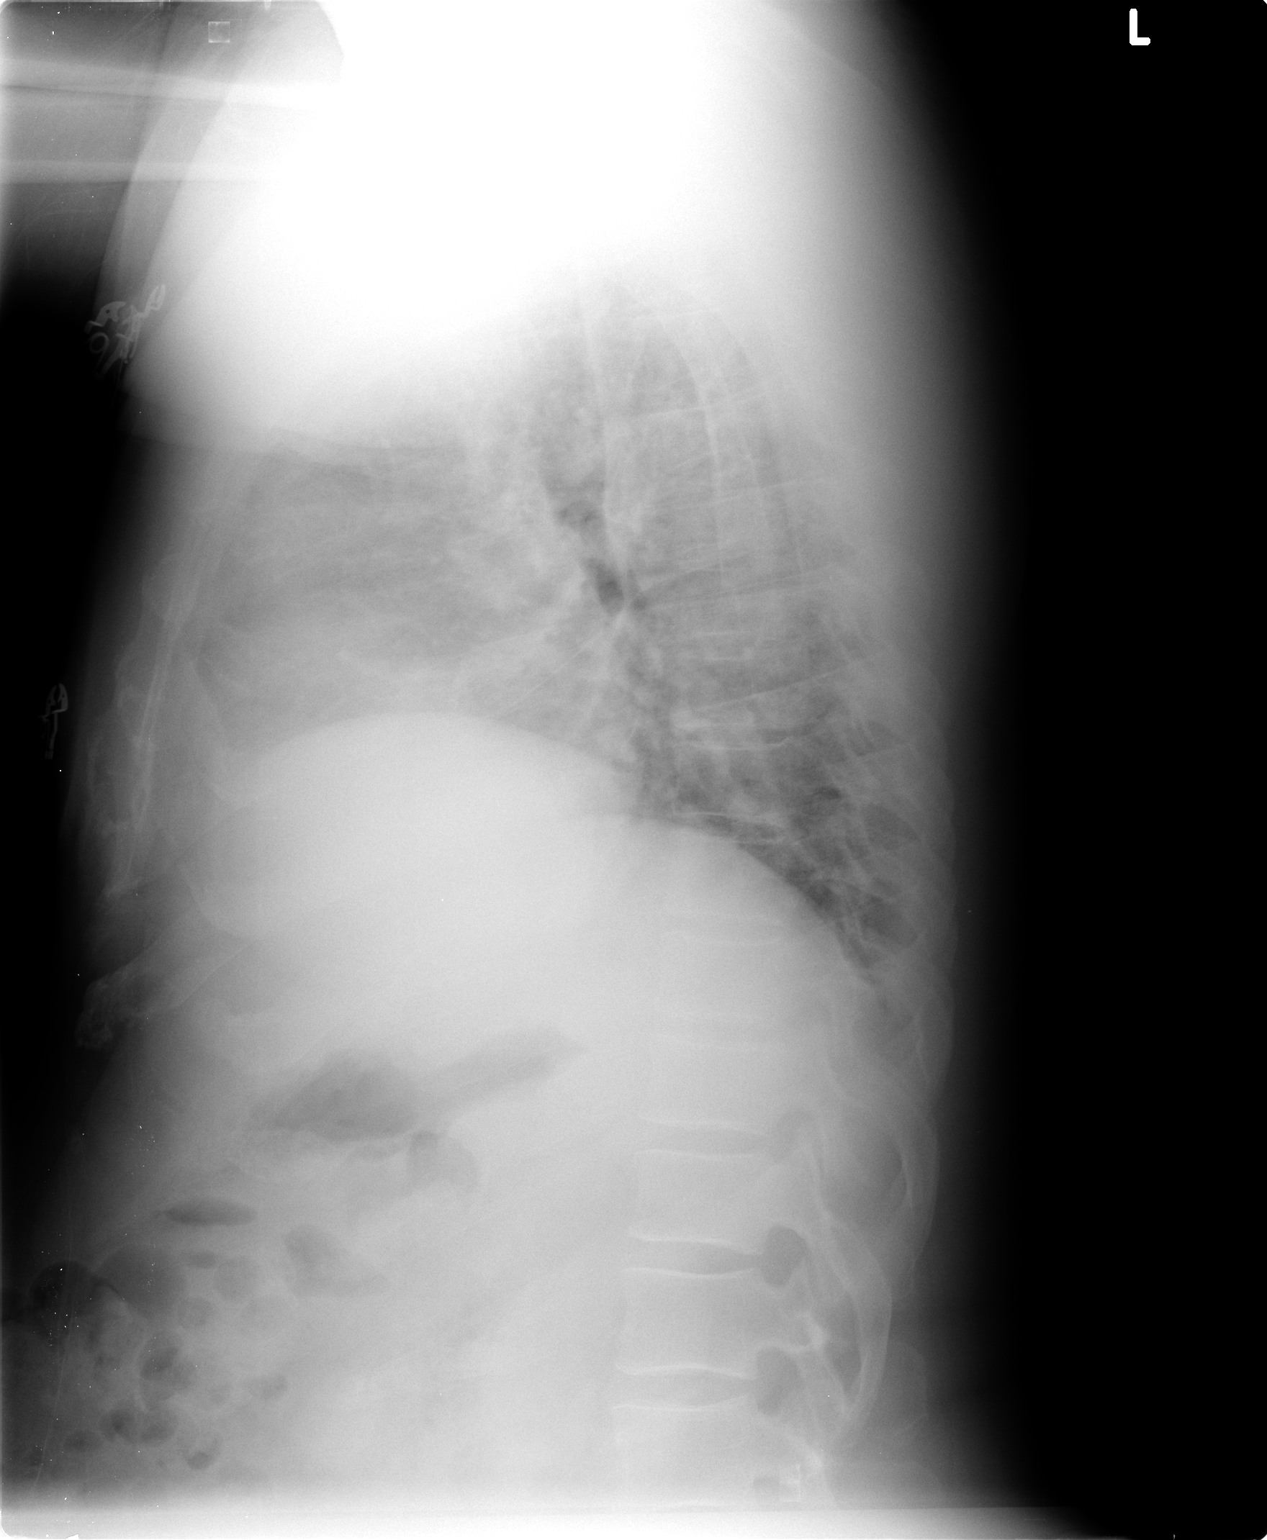

[3 of 3 positions shown; findings below may reference images not displayed]

FINDINGS: Mediastinum and hilar structures are normal. The lungs are clear of
infiltrates. Cardiomegaly noted. No evidence of overt congestive
heart failure. No pleural effusion or pneumothorax. Degenerative
changes thoracic spine .
IMPRESSION: Cardiomegaly.  No evidence of overt congestive heart failure.

## 2013-04-15 IMAGING — CT CT HEAD W/O CM
1 series · 16 of 30 positions shown, 20 images · non-contrast
Comparison: CT HEAD W/O CM dated [DATE]

CLINICAL DATA: Chest pain.  Shortness of breath.  Headaches.

EXAM:
CT HEAD WITHOUT CONTRAST
TECHNIQUE: Contiguous axial images were obtained from the base of the skull
through the vertex without intravenous contrast.

[Series 2: headseq 4.8 h37s · axial · 0.46mm/px · z∈[+1107,+1270]mm · 16 of 36 slices shown, 20 images]
[im 2/36  brain]
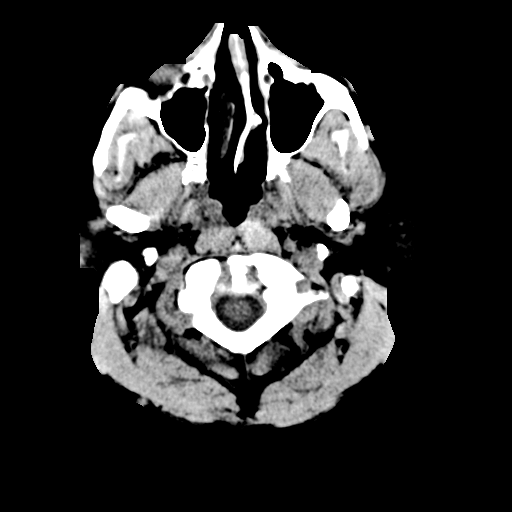
[im 2/36  bone]
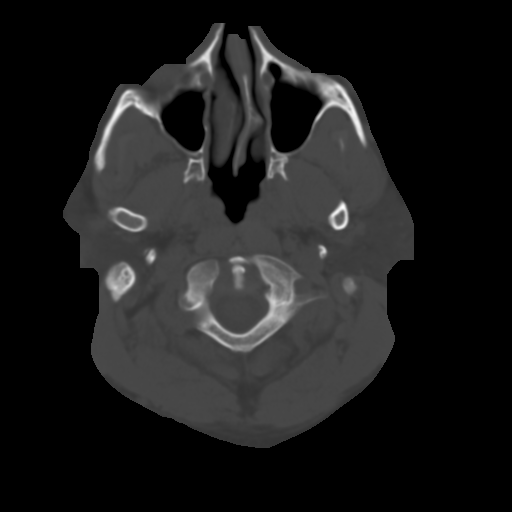
[im 4/36  brain]
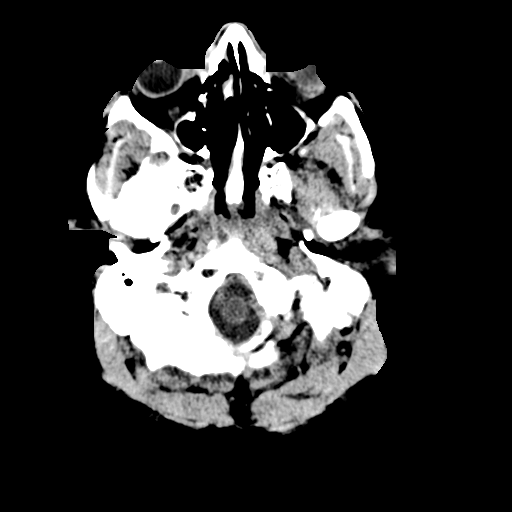
[im 7/36  brain]
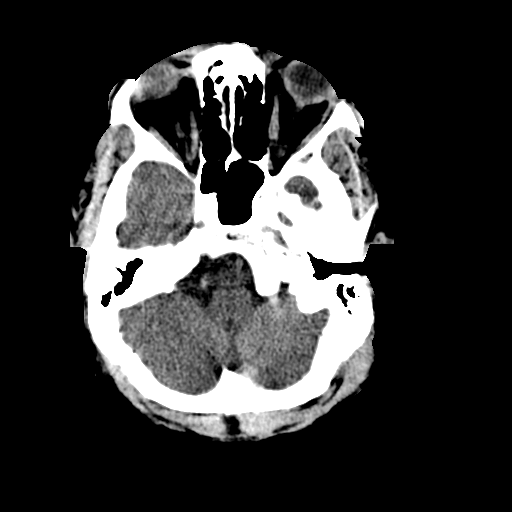
[im 9/36  brain]
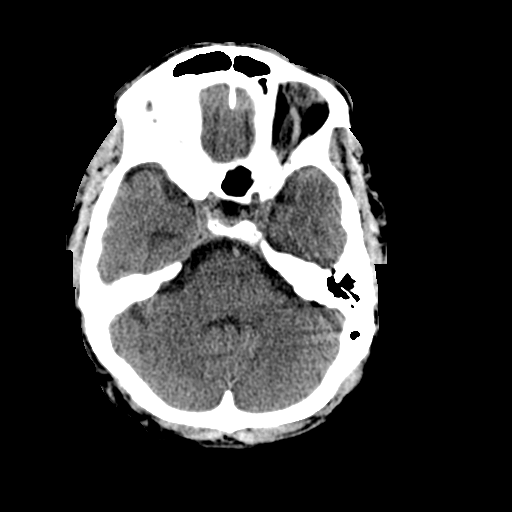
[im 10/36  brain]
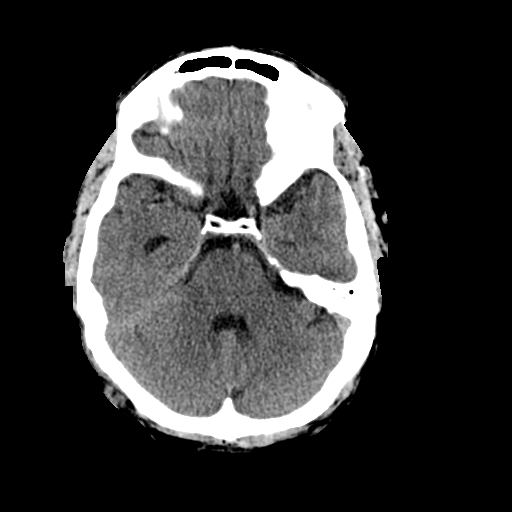
[im 10/36  bone]
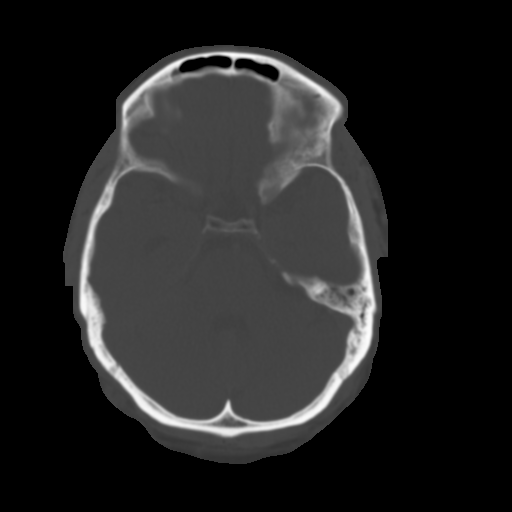
[im 13/36  brain]
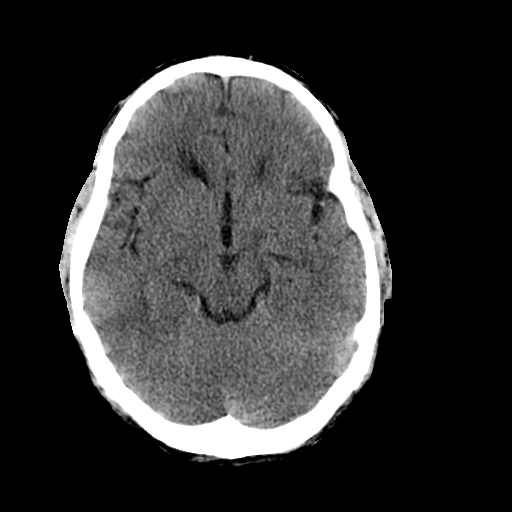
[im 15/36  brain]
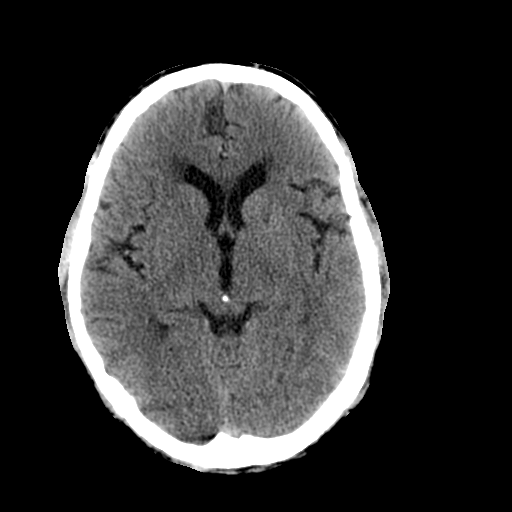
[im 17/36  brain]
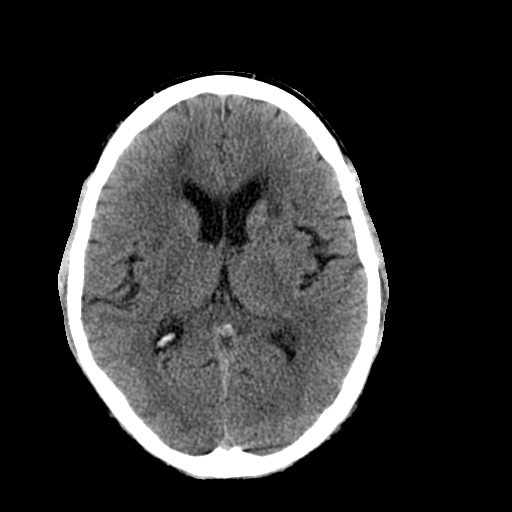
[im 19/36  brain]
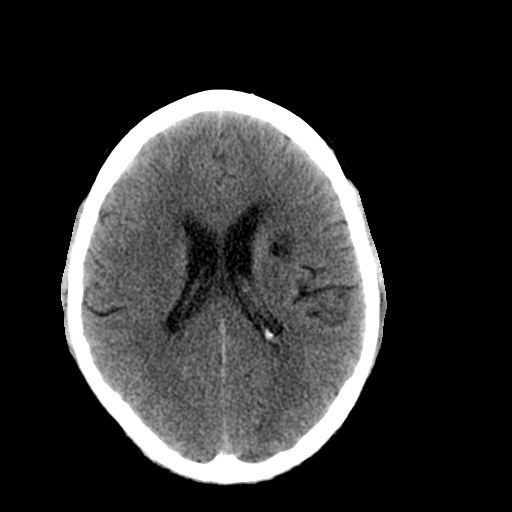
[im 19/36  bone]
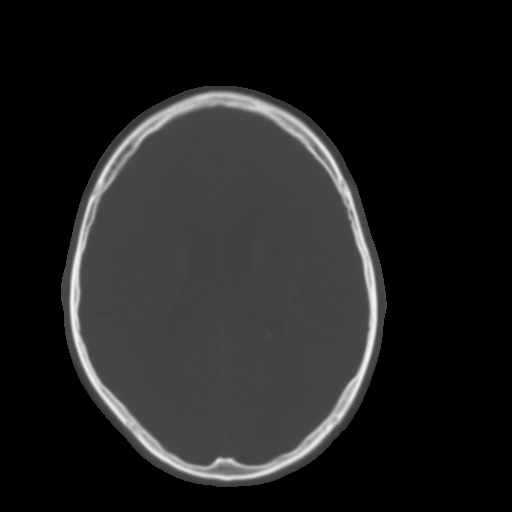
[im 21/36  brain]
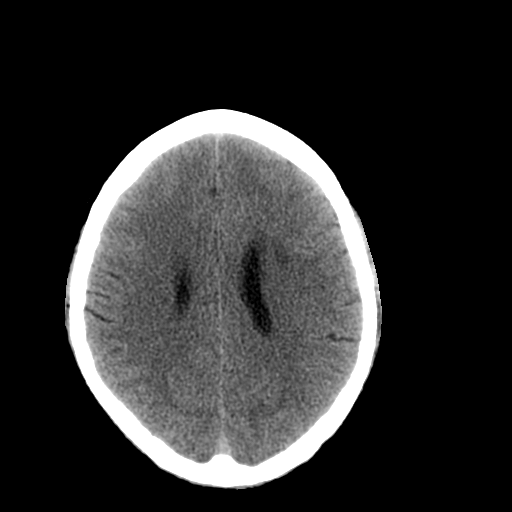
[im 23/36  brain]
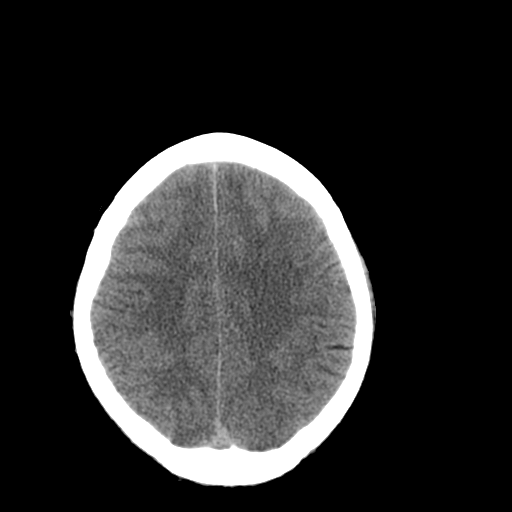
[im 26/36  brain]
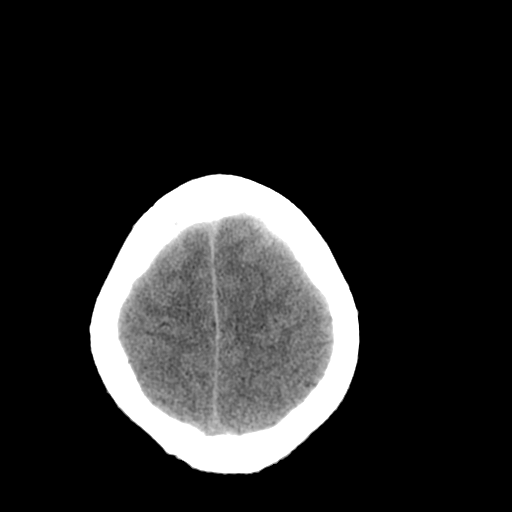
[im 27/36  brain]
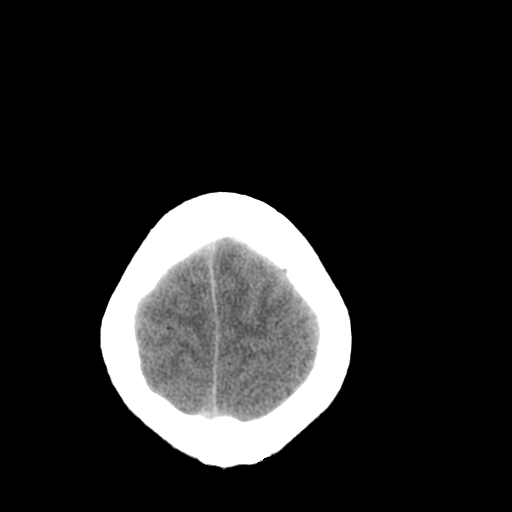
[im 27/36  bone]
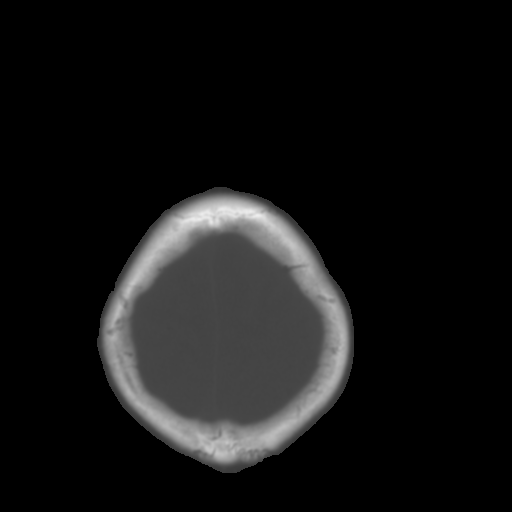
[im 29/36  brain]
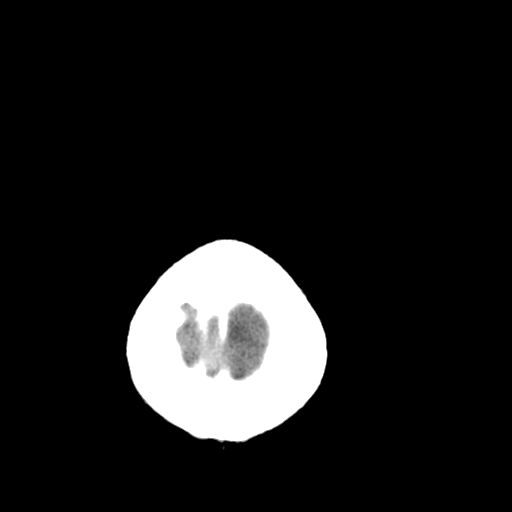
[im 32/36  brain]
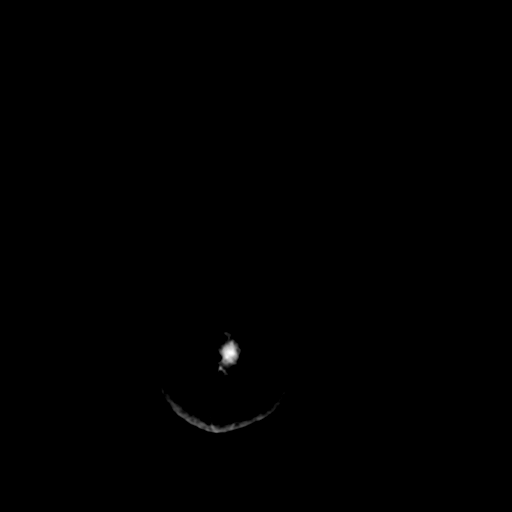
[im 34/36  brain]
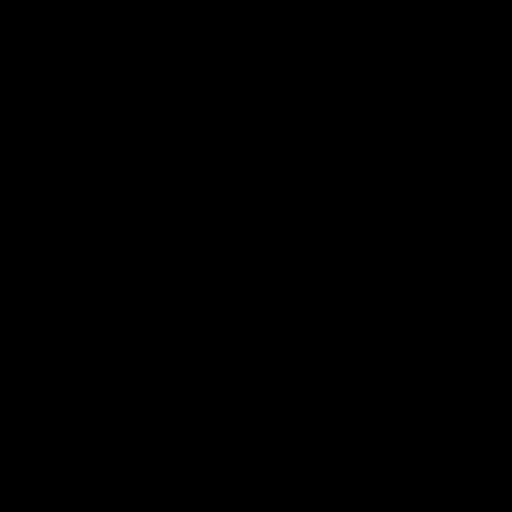

[16 of 30 positions shown; findings below may reference images not displayed]

FINDINGS: Left periventricular deep white matter prominent lucency is noted.
Given the degree of lucency this is most likely related to chronic
ischemia. Several other small areas of hypodensity noted in the
periventricular white matter are noted. These findings are most
consistent chronic ischemia. However given that these findings are
new and the patient's young age MRI brain should be considered for
further evaluation. No hydrocephalus. No hemorrhage. Orbits are
unremarkable. No acute bony abnormality.
IMPRESSION: Periventricular lucencies most consistent with chronic white matter
ischemia. These findings however new from prior study of [DATE].
Given that these findings are new and given the patient's young age,
MRI of the brain is suggested for further evaluation.

## 2013-04-15 MED ORDER — LABETALOL HCL 5 MG/ML IV SOLN: 40.0000 mg | Freq: Once | INTRAVENOUS | Status: DC

## 2013-04-15 MED ORDER — HYDROMORPHONE HCL PF 1 MG/ML IJ SOLN
1.0000 mg | Freq: Once | INTRAMUSCULAR | Status: AC
  Administered 2013-04-15: 1 mg via INTRAVENOUS
  Filled 2013-04-15: qty 1

## 2013-04-15 MED ORDER — LABETALOL HCL 5 MG/ML IV SOLN
20.0000 mg | Freq: Once | INTRAVENOUS | Status: AC
  Administered 2013-04-15: 20 mg via INTRAVENOUS
  Filled 2013-04-15: qty 4

## 2013-04-15 MED ORDER — HYDROCODONE-ACETAMINOPHEN 5-325 MG PO TABS: 1.0000 | ORAL_TABLET | Freq: Four times a day (QID) | ORAL | Status: DC | PRN

## 2013-04-15 MED ORDER — METOPROLOL TARTRATE 100 MG PO TABS: 100.0000 mg | ORAL_TABLET | Freq: Two times a day (BID) | ORAL | Status: DC

## 2013-04-15 MED ORDER — ONDANSETRON HCL 4 MG/2ML IJ SOLN
4.0000 mg | Freq: Once | INTRAMUSCULAR | Status: AC
  Administered 2013-04-15: 4 mg via INTRAVENOUS
  Filled 2013-04-15: qty 2

## 2013-04-15 MED ORDER — PENICILLIN V POTASSIUM 500 MG PO TABS: 500.0000 mg | ORAL_TABLET | Freq: Four times a day (QID) | ORAL | Status: AC

## 2013-04-15 MED ORDER — DIPHENHYDRAMINE HCL 12.5 MG/5ML PO ELIX: 25.0000 mg | ORAL_SOLUTION | Freq: Once | ORAL | Status: DC

## 2013-04-15 MED ORDER — AMLODIPINE BESYLATE 10 MG PO TABS: 10.0000 mg | ORAL_TABLET | Freq: Every day | ORAL | Status: DC

## 2013-04-15 MED ORDER — LORAZEPAM 2 MG/ML IJ SOLN
0.5000 mg | Freq: Once | INTRAMUSCULAR | Status: AC
  Administered 2013-04-15: 0.5 mg via INTRAVENOUS
  Filled 2013-04-15: qty 1

## 2013-04-15 NOTE — ED Notes (Signed)
Pt c/o intermittent sob/cp x 2 weeks. Pt also c/o ha's x 1 month. Pt also left upper toothache x 3 months- worse this week.

## 2013-04-15 NOTE — Discharge Instructions (Signed)
Follow up with a family md next week for bp.  Follow up with a dentist next week

## 2013-04-15 NOTE — ED Provider Notes (Signed)
CSN: [id number]     Arrival date & time 04/15/13  1020 History  This chart was scribed for Craig Diego, MD by Ludger Nutting, ED Scribe. This patient was seen in room APA04/APA04 and the patient's care was started 10:34 AM.    Chief Complaint  Patient presents with  . Chest Pain      Patient is a 48 y.o. male presenting with chest pain. The history is provided by the patient. No language interpreter was used.  Chest Pain Pain location:  R chest Pain radiates to:  Does not radiate Pain radiates to the back: no   Pain severity:  Mild Duration:  1 month Timing:  Intermittent Progression:  Unchanged Chronicity:  New Relieved by:  Nothing Worsened by:  Nothing tried Associated symptoms: headache and shortness of breath   Associated symptoms: no abdominal pain, no back pain, no cough and no fatigue   Risk factors: hypertension and smoking     HPI Comments: Craig Knight is a 48 y.o. male with past medical history of HTN who presents to the Emergency Department complaining of intermittent, unchanged right sided chest pain with associated SOB and HA that began 1 month ago. He also reports feeling generalized weakness and blurry vision upon sitting or standing up. He also reports a current HA to the left side of head. Pt has history of HTN but has run out of medication several months ago.   Past Medical History  Diagnosis Date  . Hypertension   . Asthma   . Bronchitis    History reviewed. No pertinent past surgical history. No family history on file. History  Substance Use Topics  . Smoking status: Current Every Day Smoker    Types: Cigarettes  . Smokeless tobacco: Not on file  . Alcohol Use: Yes     Comment: occ    Review of Systems  Constitutional: Negative for appetite change and fatigue.  HENT: Negative for congestion, ear discharge and sinus pressure.   Eyes: Negative for discharge.  Respiratory: Positive for shortness of breath. Negative for cough.   Cardiovascular:  Positive for chest pain.  Gastrointestinal: Negative for abdominal pain and diarrhea.  Genitourinary: Negative for frequency and hematuria.  Musculoskeletal: Negative for back pain.  Skin: Negative for rash.  Neurological: Positive for headaches. Negative for seizures.  Psychiatric/Behavioral: Negative for hallucinations.      Allergies  Review of patient's allergies indicates no known allergies.  Home Medications   Current Outpatient Rx  Name  Route  Sig  Dispense  Refill  . acetaminophen (TYLENOL) 500 MG tablet   Oral   Take 1,000 mg by mouth every 6 (six) hours as needed for headache.         Marland Kitchen amLODipine (NORVASC) 10 MG tablet   Oral   Take 1 tablet (10 mg total) by mouth daily.   30 tablet   1   . glipiZIDE (GLUCOTROL) 5 MG tablet   Oral   Take 1 tablet (5 mg total) by mouth 2 (two) times daily before a meal.   60 tablet   1   . hydrochlorothiazide (HYDRODIURIL) 25 MG tablet   Oral   Take 1 tablet (25 mg total) by mouth daily.   30 tablet   1   . ibuprofen (ADVIL,MOTRIN) 200 MG tablet   Oral   Take 600 mg by mouth every 6 (six) hours as needed for headache.         . metoprolol (LOPRESSOR) 50 MG tablet  Oral   Take 1 tablet (50 mg total) by mouth 2 (two) times daily.   60 tablet   1   . spironolactone (ALDACTONE) 25 MG tablet   Oral   Take 1 tablet (25 mg total) by mouth 2 (two) times daily.   60 tablet   1    BP 165/65  Pulse 60  Temp(Src) 98.1 F (36.7 C)  Resp 18  Ht 5\' 7"  (1.702 m)  Wt 225 lb (102.059 kg)  BMI 35.23 kg/m2  SpO2 98% Physical Exam  Nursing note and vitals reviewed. Constitutional: He is oriented to person, place, and time. He appears well-developed.  HENT:  Head: Normocephalic.  Eyes: Conjunctivae and EOM are normal. No scleral icterus.  Neck: Neck supple. No thyromegaly present.  Cardiovascular: Normal rate and regular rhythm.  Exam reveals no gallop and no friction rub.   No murmur heard. Pulmonary/Chest: No  stridor. He has no wheezes. He has no rales. He exhibits no tenderness.  Abdominal: He exhibits no distension. There is no tenderness. There is no rebound.  Musculoskeletal: Normal range of motion. He exhibits no edema.  Lymphadenopathy:    He has no cervical adenopathy.  Neurological: He is oriented to person, place, and time. He exhibits normal muscle tone. Coordination normal.  Skin: No rash noted. No erythema.  Psychiatric: He has a normal mood and affect. His behavior is normal.    ED Course  Procedures (including critical care time)  DIAGNOSTIC STUDIES: Oxygen Saturation is 98% on RA, normal by my interpretation.    COORDINATION OF CARE: 10:48 AM Discussed treatment plan with pt at bedside and pt agreed to plan.   Labs Review Labs Reviewed  COMPREHENSIVE METABOLIC PANEL - Abnormal; Notable for the following:    Glucose, Bld 121 (*)    Total Bilirubin 0.2 (*)    GFR calc non Af Amer 67 (*)    GFR calc Af Amer 77 (*)    All other components within normal limits  CBC WITH DIFFERENTIAL  TROPONIN I   Imaging Review Dg Chest 2 View  04/15/2013   CLINICAL DATA:  Chest pain .  EXAM: CHEST  2 VIEW  COMPARISON:  DG CHEST 2V dated 11/01/2011  FINDINGS: Mediastinum and hilar structures are normal. The lungs are clear of infiltrates. Cardiomegaly noted. No evidence of overt congestive heart failure. No pleural effusion or pneumothorax. Degenerative changes thoracic spine .  IMPRESSION: Cardiomegaly.  No evidence of overt congestive heart failure.   Electronically Signed   By: Maisie Fus  Register   On: 04/15/2013 11:33   Ct Head Wo Contrast  04/15/2013   CLINICAL DATA:  Chest pain.  Shortness of breath.  Headaches.  EXAM: CT HEAD WITHOUT CONTRAST  TECHNIQUE: Contiguous axial images were obtained from the base of the skull through the vertex without intravenous contrast.  COMPARISON:  CT HEAD W/O CM dated 04/26/2009  FINDINGS: Left periventricular deep white matter prominent lucency is noted.  Given the degree of lucency this is most likely related to chronic ischemia. Several other small areas of hypodensity noted in the periventricular white matter are noted. These findings are most consistent chronic ischemia. However given that these findings are new and the patient's young age MRI brain should be considered for further evaluation. No hydrocephalus. No hemorrhage. Orbits are unremarkable. No acute bony abnormality.  IMPRESSION: Periventricular lucencies most consistent with chronic white matter ischemia. These findings however new from prior study of 04/26/2009. Given that these findings are new and given  the patient's young age, MRI of the brain is suggested for further evaluation.   Electronically Signed   By: Marcello Moores  Register   On: 04/15/2013 11:42    EKG Interpretation   None       MDM   Final diagnoses:  None    htn poorly controled.   The chart was scribed for me under my direct supervision.  I personally performed the history, physical, and medical decision making and all procedures in the evaluation of this patient.Craig Diego, MD 04/15/13 1520

## 2013-04-15 NOTE — ED Notes (Signed)
Patient transported to X-ray 

## 2013-09-15 ENCOUNTER — Emergency Department (HOSPITAL_COMMUNITY): Payer: BC Managed Care – PPO

## 2013-09-15 ENCOUNTER — Observation Stay (HOSPITAL_COMMUNITY)
Admission: EM | Admit: 2013-09-15 | Discharge: 2013-09-17 | Disposition: A | Discharge status: Home / Self Care | Payer: BC Managed Care – PPO | Attending: Internal Medicine | Admitting: Internal Medicine

## 2013-09-15 ENCOUNTER — Observation Stay (HOSPITAL_COMMUNITY): Payer: BC Managed Care – PPO

## 2013-09-15 ENCOUNTER — Encounter (HOSPITAL_COMMUNITY): Payer: Self-pay | Admitting: Emergency Medicine

## 2013-09-15 DIAGNOSIS — J45901 Unspecified asthma with (acute) exacerbation: Secondary | ICD-10-CM | POA: Insufficient documentation

## 2013-09-15 DIAGNOSIS — E1121 Type 2 diabetes mellitus with diabetic nephropathy: Secondary | ICD-10-CM

## 2013-09-15 DIAGNOSIS — R109 Unspecified abdominal pain: Secondary | ICD-10-CM | POA: Insufficient documentation

## 2013-09-15 DIAGNOSIS — J029 Acute pharyngitis, unspecified: Secondary | ICD-10-CM | POA: Insufficient documentation

## 2013-09-15 DIAGNOSIS — Z720 Tobacco use: Secondary | ICD-10-CM | POA: Diagnosis present

## 2013-09-15 DIAGNOSIS — R61 Generalized hyperhidrosis: Secondary | ICD-10-CM | POA: Insufficient documentation

## 2013-09-15 DIAGNOSIS — M7989 Other specified soft tissue disorders: Secondary | ICD-10-CM | POA: Insufficient documentation

## 2013-09-15 DIAGNOSIS — H538 Other visual disturbances: Secondary | ICD-10-CM | POA: Insufficient documentation

## 2013-09-15 DIAGNOSIS — R51 Headache: Secondary | ICD-10-CM | POA: Insufficient documentation

## 2013-09-15 DIAGNOSIS — R072 Precordial pain: Principal | ICD-10-CM | POA: Insufficient documentation

## 2013-09-15 DIAGNOSIS — Z79899 Other long term (current) drug therapy: Secondary | ICD-10-CM | POA: Insufficient documentation

## 2013-09-15 DIAGNOSIS — R112 Nausea with vomiting, unspecified: Secondary | ICD-10-CM | POA: Insufficient documentation

## 2013-09-15 DIAGNOSIS — F17200 Nicotine dependence, unspecified, uncomplicated: Secondary | ICD-10-CM | POA: Insufficient documentation

## 2013-09-15 DIAGNOSIS — I10 Essential (primary) hypertension: Secondary | ICD-10-CM | POA: Diagnosis present

## 2013-09-15 DIAGNOSIS — R079 Chest pain, unspecified: Secondary | ICD-10-CM | POA: Diagnosis present

## 2013-09-15 DIAGNOSIS — R42 Dizziness and giddiness: Secondary | ICD-10-CM | POA: Insufficient documentation

## 2013-09-15 DIAGNOSIS — E119 Type 2 diabetes mellitus without complications: Secondary | ICD-10-CM | POA: Diagnosis present

## 2013-09-15 DIAGNOSIS — R197 Diarrhea, unspecified: Secondary | ICD-10-CM | POA: Insufficient documentation

## 2013-09-15 DIAGNOSIS — I5032 Chronic diastolic (congestive) heart failure: Secondary | ICD-10-CM | POA: Diagnosis present

## 2013-09-15 LAB — TROPONIN I
Troponin I: <0.3 ng/mL (ref <0.30)
Troponin I: <0.3 ng/mL (ref <0.30)

## 2013-09-15 LAB — URINALYSIS, ROUTINE W REFLEX MICROSCOPIC
Bilirubin Urine: NEGATIVE
Glucose, UA: NEGATIVE mg/dL
Hgb urine dipstick: NEGATIVE
Ketones, ur: NEGATIVE mg/dL
Leukocytes, UA: NEGATIVE
Nitrite: NEGATIVE
PROTEIN: NEGATIVE mg/dL
Specific Gravity, Urine: 1.025 (ref 1.005–1.030)
Urobilinogen, UA: 0.2 mg/dL (ref 0.0–1.0)
pH: 5.5 (ref 5.0–8.0)

## 2013-09-15 LAB — COMPREHENSIVE METABOLIC PANEL
ALK PHOS: 103 U/L (ref 39–117)
ALT: 21 U/L (ref 0–53)
AST: 14 U/L (ref 0–37)
Albumin: 4.4 g/dL (ref 3.5–5.2)
Anion gap: 10 (ref 5–15)
BUN: 17 mg/dL (ref 6–23)
CALCIUM: 9.6 mg/dL (ref 8.4–10.5)
CO2: 29 meq/L (ref 19–32)
Chloride: 105 mEq/L (ref 96–112)
Creatinine, Ser: 1.35 mg/dL (ref 0.50–1.35)
GFR, EST AFRICAN AMERICAN: 70 mL/min — AB (ref >90)
GFR, EST NON AFRICAN AMERICAN: 61 mL/min — AB (ref >90)
Glucose, Bld: 79 mg/dL (ref 70–99)
POTASSIUM: 4.1 meq/L (ref 3.7–5.3)
SODIUM: 144 meq/L (ref 137–147)
Total Bilirubin: <0.2 mg/dL — ABNORMAL LOW (ref 0.3–1.2)
Total Protein: 7.6 g/dL (ref 6.0–8.3)

## 2013-09-15 LAB — GLUCOSE, CAPILLARY
GLUCOSE-CAPILLARY: 135 mg/dL — AB (ref 70–99)
Glucose-Capillary: 104 mg/dL — ABNORMAL HIGH (ref 70–99)

## 2013-09-15 LAB — CBC
HEMATOCRIT: 44.8 % (ref 39.0–52.0)
Hemoglobin: 15.3 g/dL (ref 13.0–17.0)
MCH: 27.2 pg (ref 26.0–34.0)
MCHC: 34.2 g/dL (ref 30.0–36.0)
MCV: 79.7 fL (ref 78.0–100.0)
Platelets: 193 10*3/uL (ref 150–400)
RBC: 5.62 MIL/uL (ref 4.22–5.81)
RDW: 13.5 % (ref 11.5–15.5)
WBC: 7.6 10*3/uL (ref 4.0–10.5)

## 2013-09-15 LAB — PRO B NATRIURETIC PEPTIDE: Pro B Natriuretic peptide (BNP): 69 pg/mL (ref 0–125)

## 2013-09-15 IMAGING — CR DG ABDOMEN 2V
2 series · 2 of 2 positions shown · non-contrast
Comparison: None.

CLINICAL DATA: Abdominal pain, bloating.

EXAM:
ABDOMEN - 2 VIEW

[view not recorded (1 of 2)]
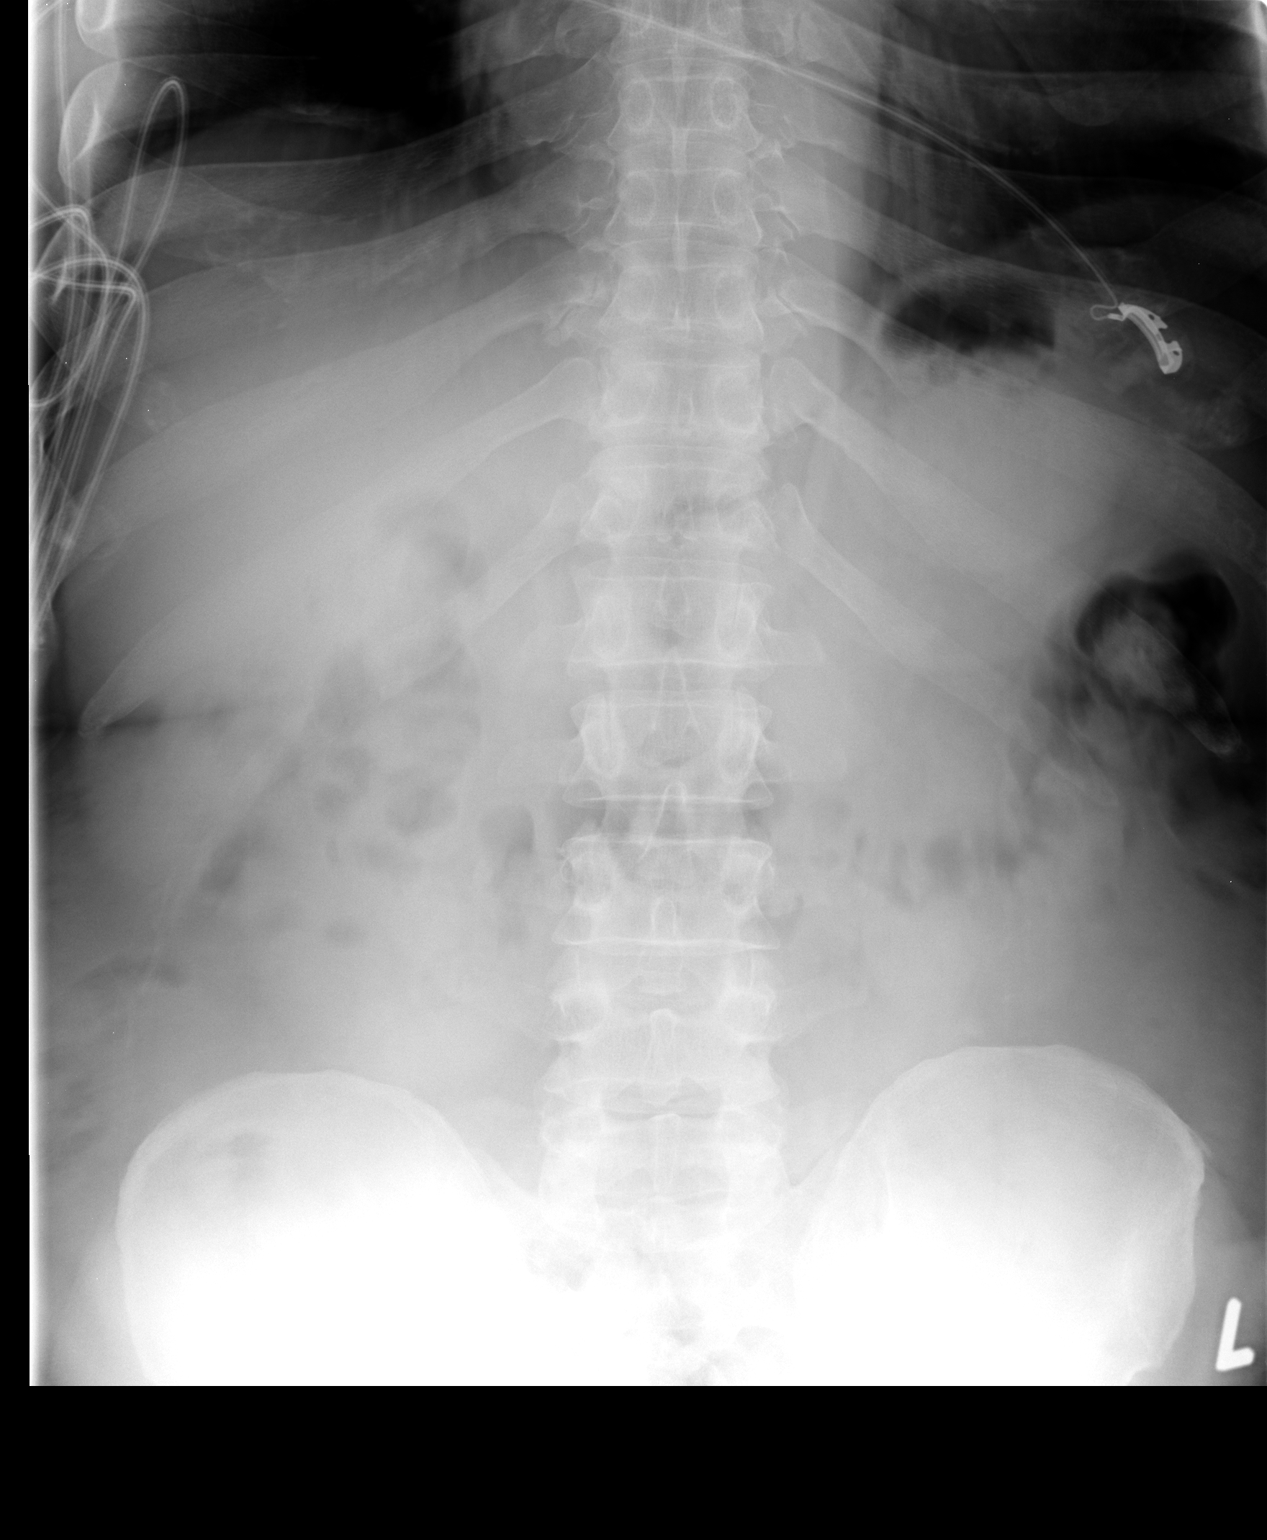

[view not recorded (2 of 2)]
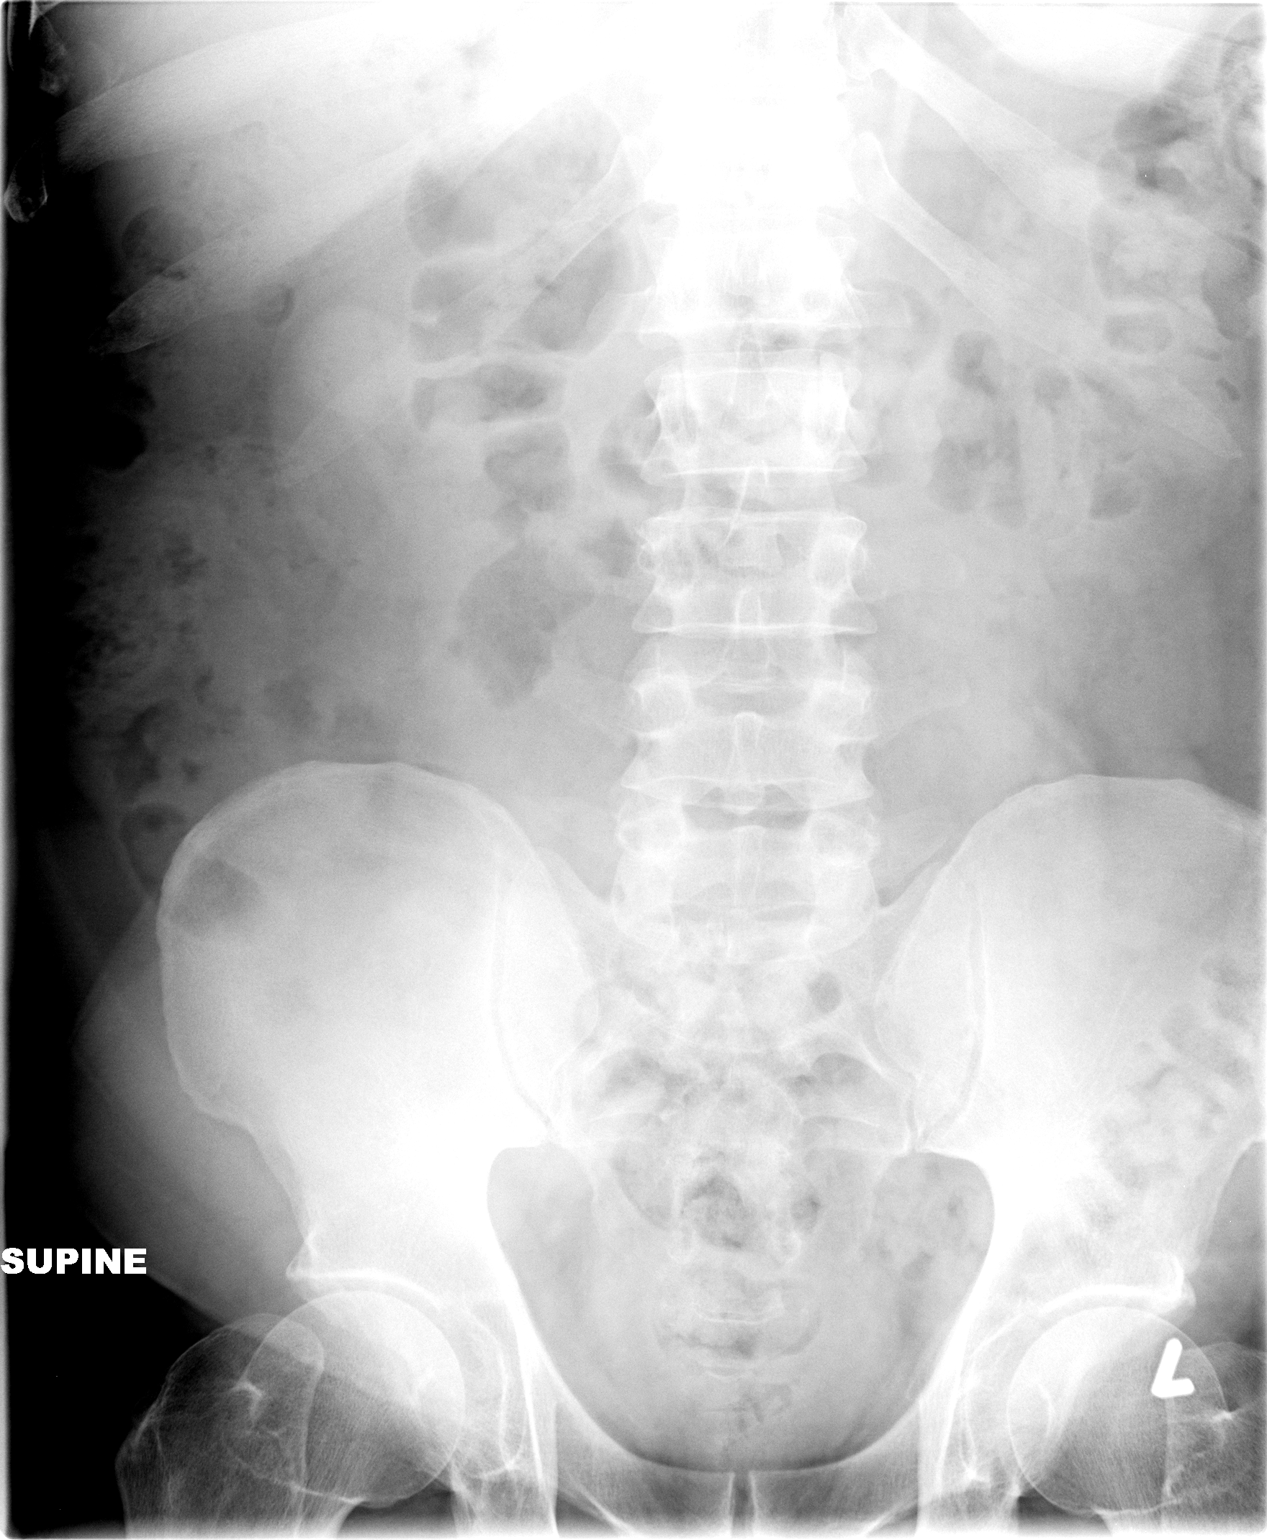

[2 of 2 positions shown; findings below may reference images not displayed]

FINDINGS: The bowel gas pattern is normal. There is no evidence of free air.
No radio-opaque calculi or other significant radiographic
abnormality is seen.
IMPRESSION: No evidence of bowel obstruction or ileus.

## 2013-09-15 IMAGING — CR DG CHEST 1V PORT
1 series · 1 of 1 positions shown · non-contrast
Comparison: Prior chest x-ray [DATE]

CLINICAL DATA: Chest pain, headache

EXAM:
PORTABLE CHEST - 1 VIEW

[portable]
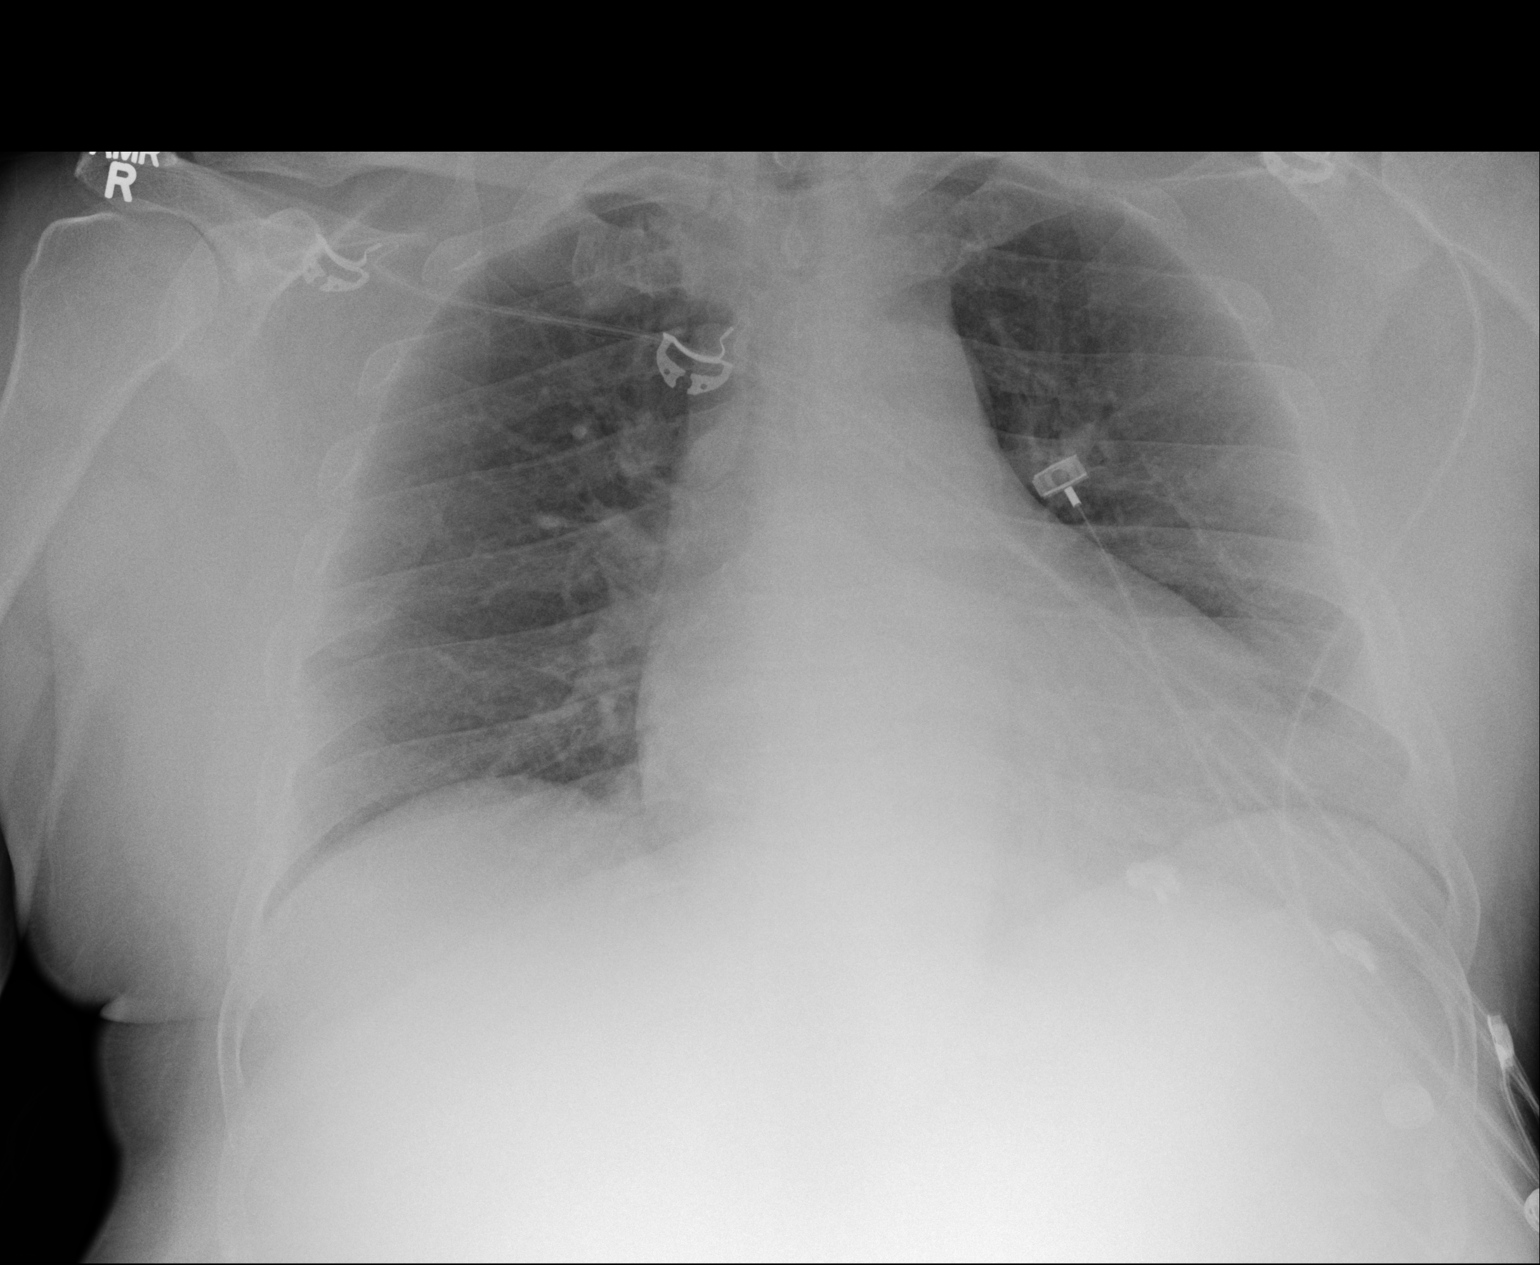

[1 of 1 positions shown; findings below may reference images not displayed]

FINDINGS: Low inspiratory volumes. The lungs are otherwise clear. Stable
cardiac and mediastinal contours. Heart is upper limits of normal in
size. No pleural effusion or pneumothorax. No acute osseous
abnormality.
IMPRESSION: 1. No acute cardiopulmonary process.
2. Stable cardiomegaly.

## 2013-09-15 IMAGING — CT CT HEAD W/O CM
1 series · 15 of 30 positions shown, 19 images · non-contrast
Comparison: [DATE] head CT and earlier.

CLINICAL DATA: 47-year-old male with headache. Chest pain. Initial
encounter.

EXAM:
CT HEAD WITHOUT CONTRAST
TECHNIQUE: Contiguous axial images were obtained from the base of the skull
through the vertex without intravenous contrast.

[Series 2: headseq 4.8 h37s · axial · 0.43mm/px · z∈[+1202,+1356]mm · 15 of 36 slices shown, 19 images]
[im 2/36  brain]
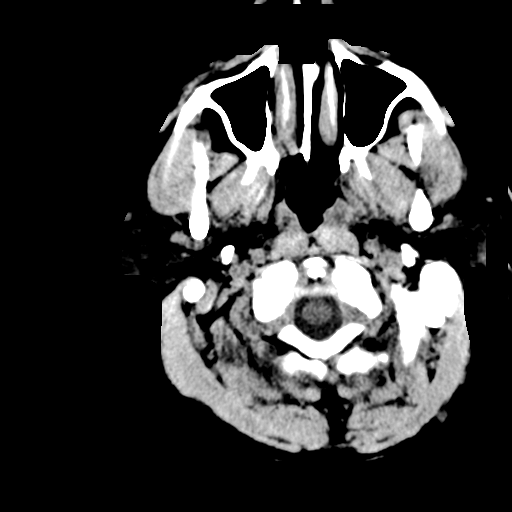
[im 2/36  bone]
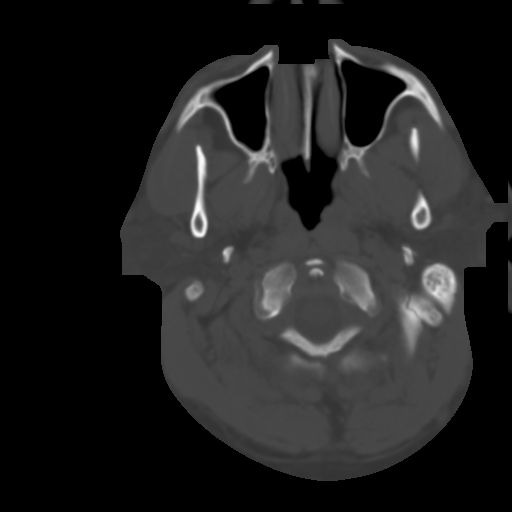
[im 4/36  brain]
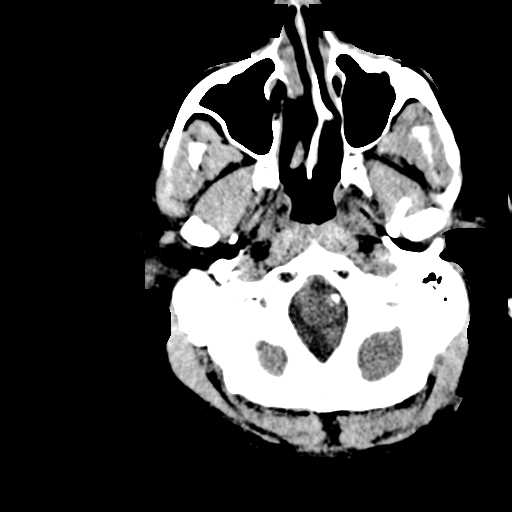
[im 7/36  brain]
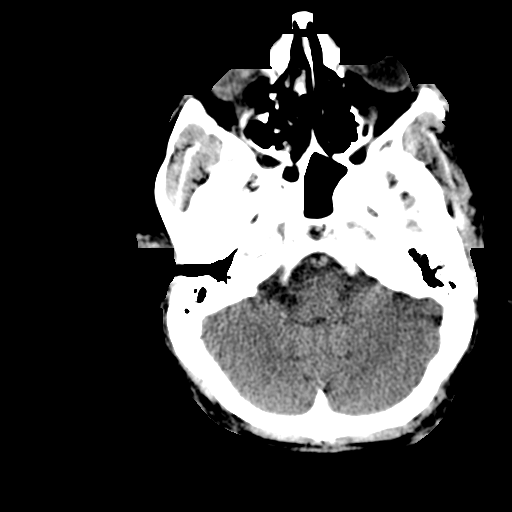
[im 9/36  brain]
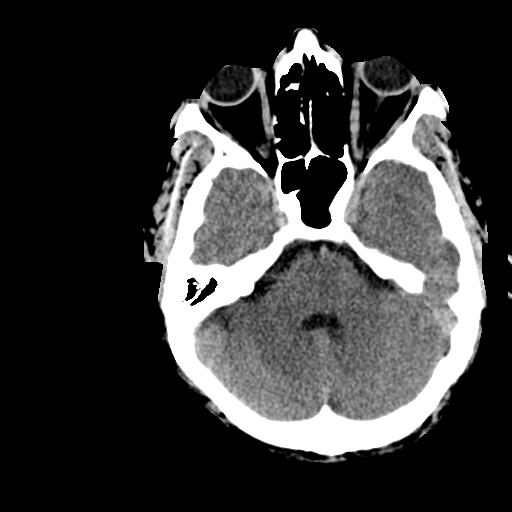
[im 11/36  brain]
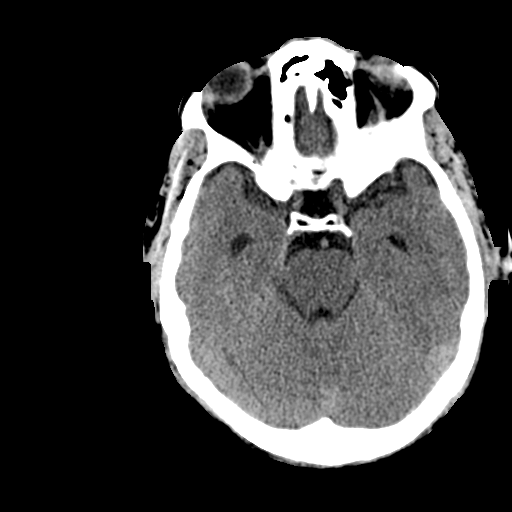
[im 11/36  bone]
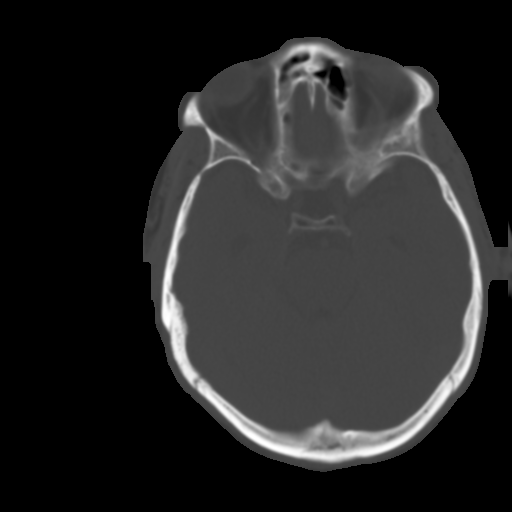
[im 14/36  brain]
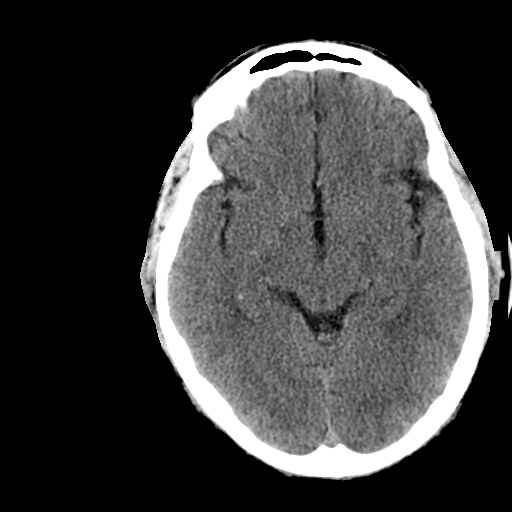
[im 16/36  brain]
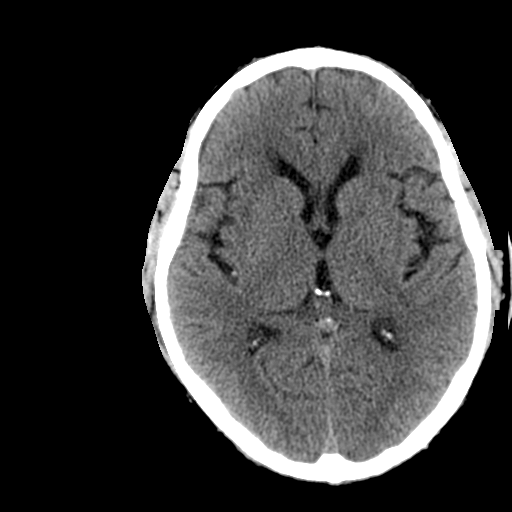
[im 19/36  brain]
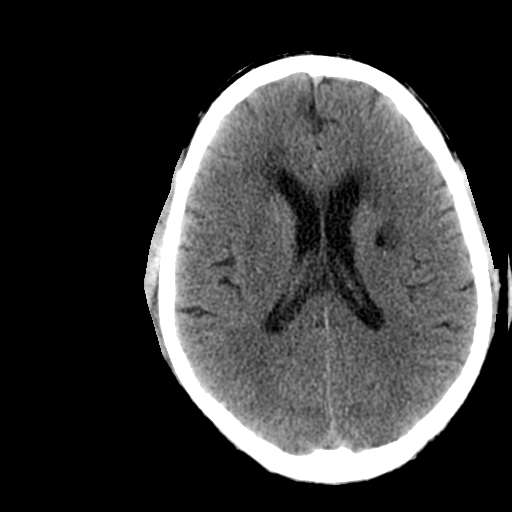
[im 20/36  brain]
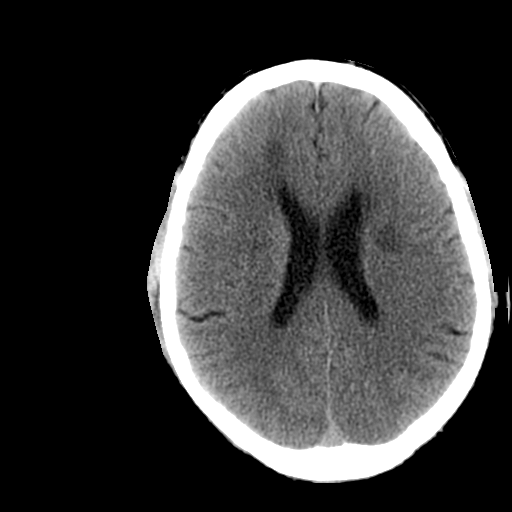
[im 20/36  bone]
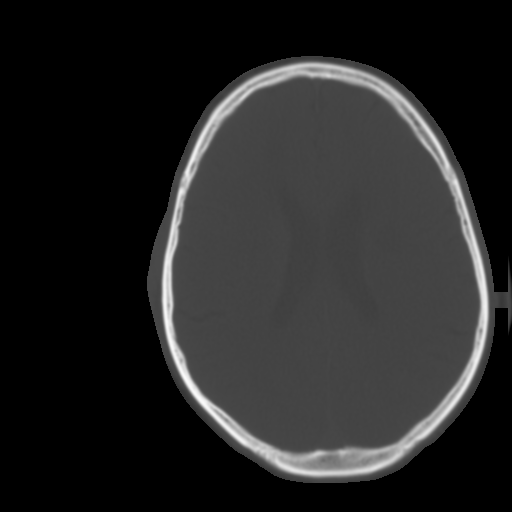
[im 22/36  brain]
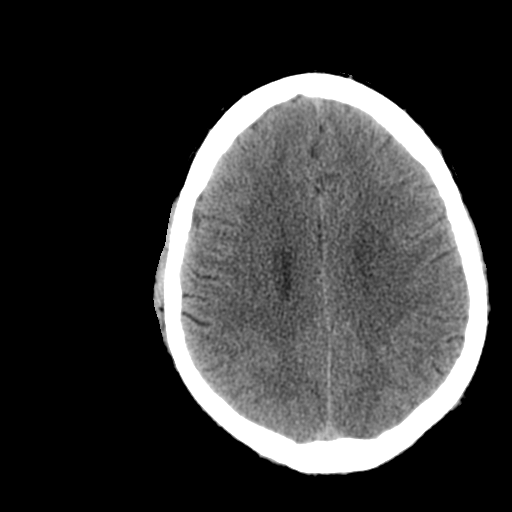
[im 25/36  brain]
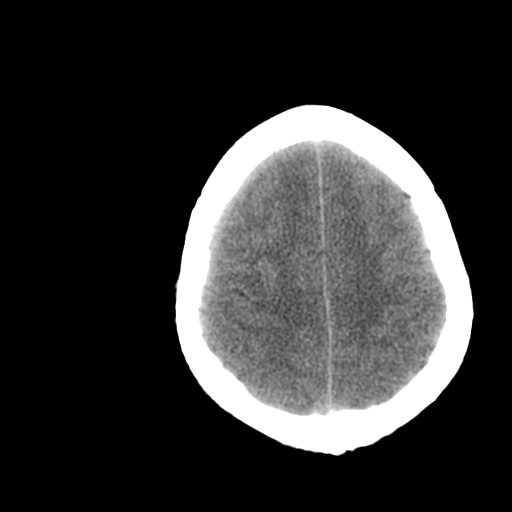
[im 27/36  brain]
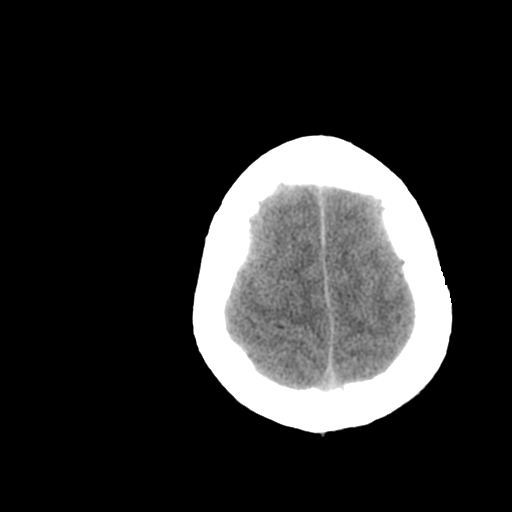
[im 29/36  brain]
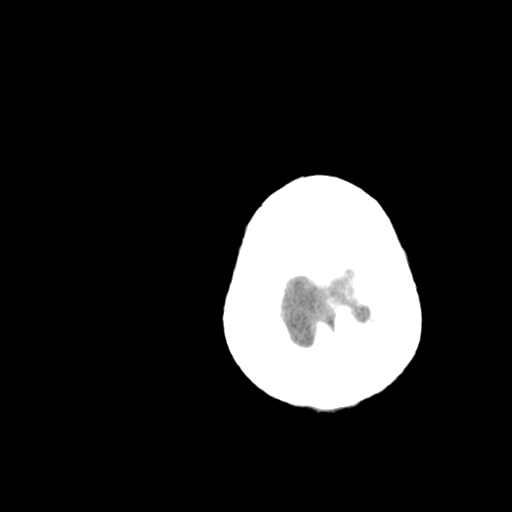
[im 29/36  bone]
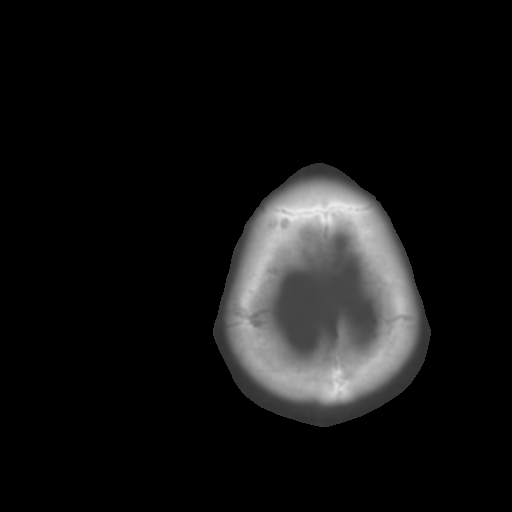
[im 32/36  brain]
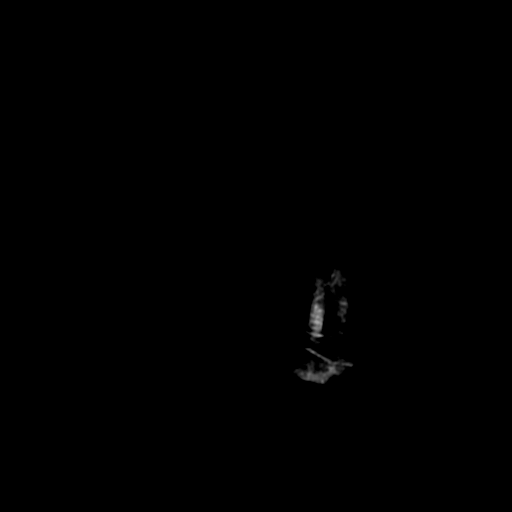
[im 34/36  brain]
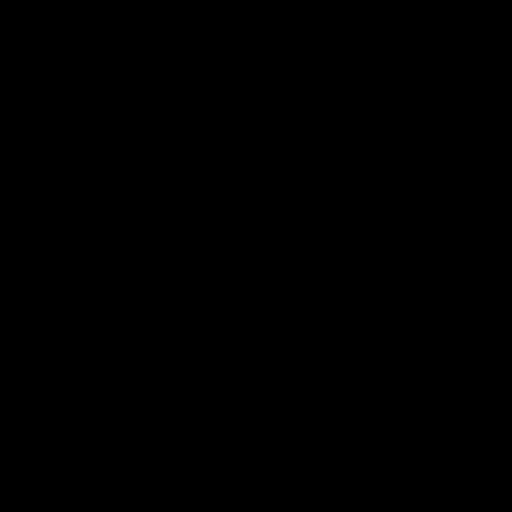

[15 of 30 positions shown; findings below may reference images not displayed]

FINDINGS: Chronic mild paranasal sinus mucosal thickening and inferior mastoid
effusions. Sinuses and mastoids appear stable. No acute osseous
abnormality identified. Visualized orbits and scalp soft tissues are
within normal limits.

Mild Calcified atherosclerosis at the skull base. Stable cerebral
volume. No ventriculomegaly. No midline shift, mass effect, or
evidence of intracranial mass lesion. No acute intracranial
hemorrhage identified. Chronic left corona radiata - basal ganglia
lacunar infarct with surrounding hypodensity. Suggestion of small
area of cortical encephalomalacia in the anterior right frontal lobe
ACA territory, less likely normal anatomic variation of the sulcus,
also stable. No evidence of cortically based acute infarction
identified. No suspicious intracranial vascular hyperdensity.
IMPRESSION: Stable chronic small vessel ischemia. No acute intracranial
abnormality.

## 2013-09-15 MED ORDER — INSULIN ASPART 100 UNIT/ML ~~LOC~~ SOLN
0.0000 [IU] | Freq: Three times a day (TID) | SUBCUTANEOUS | Status: DC
  Administered 2013-09-17: 1 [IU] via SUBCUTANEOUS

## 2013-09-15 MED ORDER — ACETAMINOPHEN 325 MG PO TABS: 650.0000 mg | ORAL_TABLET | Freq: Four times a day (QID) | ORAL | Status: DC | PRN

## 2013-09-15 MED ORDER — MORPHINE SULFATE 2 MG/ML IJ SOLN
2.0000 mg | Freq: Once | INTRAMUSCULAR | Status: AC
  Administered 2013-09-15: 2 mg via INTRAVENOUS
  Filled 2013-09-15: qty 1

## 2013-09-15 MED ORDER — LISINOPRIL 5 MG PO TABS
5.0000 mg | ORAL_TABLET | Freq: Every day | ORAL | Status: DC
  Administered 2013-09-15 – 2013-09-17 (×3): 5 mg via ORAL
  Filled 2013-09-15 (×3): qty 1

## 2013-09-15 MED ORDER — HYDROCODONE-ACETAMINOPHEN 5-325 MG PO TABS
1.0000 | ORAL_TABLET | ORAL | Status: DC | PRN
  Administered 2013-09-15 – 2013-09-17 (×7): 2 via ORAL
  Filled 2013-09-15 (×7): qty 2

## 2013-09-15 MED ORDER — ASPIRIN 81 MG PO CHEW
324.0000 mg | CHEWABLE_TABLET | Freq: Once | ORAL | Status: AC
  Administered 2013-09-15: 324 mg via ORAL
  Filled 2013-09-15: qty 4

## 2013-09-15 MED ORDER — POLYETHYLENE GLYCOL 3350 17 G PO PACK
17.0000 g | PACK | Freq: Every day | ORAL | Status: DC
  Administered 2013-09-15 – 2013-09-16 (×2): 17 g via ORAL
  Filled 2013-09-15 (×3): qty 1

## 2013-09-15 MED ORDER — MORPHINE SULFATE 2 MG/ML IJ SOLN
1.0000 mg | INTRAMUSCULAR | Status: DC | PRN
  Administered 2013-09-16 (×2): 1 mg via INTRAVENOUS
  Filled 2013-09-15 (×2): qty 1

## 2013-09-15 MED ORDER — AMLODIPINE BESYLATE 5 MG PO TABS
5.0000 mg | ORAL_TABLET | Freq: Every day | ORAL | Status: DC
  Administered 2013-09-15 – 2013-09-17 (×3): 5 mg via ORAL
  Filled 2013-09-15 (×3): qty 1

## 2013-09-15 MED ORDER — METOPROLOL TARTRATE 50 MG PO TABS
100.0000 mg | ORAL_TABLET | Freq: Once | ORAL | Status: AC
  Administered 2013-09-15: 100 mg via ORAL
  Filled 2013-09-15: qty 2

## 2013-09-15 MED ORDER — SODIUM CHLORIDE 0.9 % IJ SOLN
3.0000 mL | Freq: Two times a day (BID) | INTRAMUSCULAR | Status: DC
  Administered 2013-09-15 – 2013-09-17 (×4): 3 mL via INTRAVENOUS

## 2013-09-15 MED ORDER — ALUM & MAG HYDROXIDE-SIMETH 200-200-20 MG/5ML PO SUSP: 30.0000 mL | Freq: Four times a day (QID) | ORAL | Status: DC | PRN

## 2013-09-15 MED ORDER — PANTOPRAZOLE SODIUM 40 MG PO TBEC
40.0000 mg | DELAYED_RELEASE_TABLET | Freq: Every day | ORAL | Status: DC
  Administered 2013-09-15 – 2013-09-17 (×3): 40 mg via ORAL
  Filled 2013-09-15 (×3): qty 1

## 2013-09-15 MED ORDER — SODIUM CHLORIDE 0.9 % IV SOLN: INTRAVENOUS | Status: DC

## 2013-09-15 MED ORDER — NITROGLYCERIN 0.4 MG SL SUBL
0.4000 mg | SUBLINGUAL_TABLET | SUBLINGUAL | Status: DC | PRN
Start: 2013-09-15 — End: 2013-09-17
  Administered 2013-09-15 (×3): 0.4 mg via SUBLINGUAL
  Filled 2013-09-15: qty 1

## 2013-09-15 MED ORDER — ACETAMINOPHEN 650 MG RE SUPP: 650.0000 mg | Freq: Four times a day (QID) | RECTAL | Status: DC | PRN

## 2013-09-15 MED ORDER — ONDANSETRON HCL 4 MG/2ML IJ SOLN: 4.0000 mg | Freq: Four times a day (QID) | INTRAMUSCULAR | Status: DC | PRN

## 2013-09-15 MED ORDER — HEPARIN SODIUM (PORCINE) 5000 UNIT/ML IJ SOLN
5000.0000 [IU] | Freq: Three times a day (TID) | INTRAMUSCULAR | Status: DC
  Administered 2013-09-15 – 2013-09-17 (×6): 5000 [IU] via SUBCUTANEOUS
  Filled 2013-09-15 (×5): qty 1

## 2013-09-15 MED ORDER — ONDANSETRON HCL 4 MG/2ML IJ SOLN
4.0000 mg | Freq: Once | INTRAMUSCULAR | Status: AC
  Administered 2013-09-15: 4 mg via INTRAVENOUS
  Filled 2013-09-15: qty 2

## 2013-09-15 MED ORDER — METOPROLOL TARTRATE 25 MG PO TABS
25.0000 mg | ORAL_TABLET | Freq: Two times a day (BID) | ORAL | Status: DC
  Administered 2013-09-15 – 2013-09-17 (×5): 25 mg via ORAL
  Filled 2013-09-15 (×5): qty 1

## 2013-09-15 MED ORDER — ONDANSETRON HCL 4 MG PO TABS: 4.0000 mg | ORAL_TABLET | Freq: Four times a day (QID) | ORAL | Status: DC | PRN

## 2013-09-15 NOTE — ED Notes (Signed)
Central CP that started 3 days ago. Ran out of BP meds 5 days ago, reports HA, no radiation, lightheaded yesterday.

## 2013-09-15 NOTE — H&P (Signed)
[facility] History and Physical  Craig Knight JKK:[id number] DOB: 09/20/1965 DOA: 09/15/2013  Referring physician:  Fredia Sorrow, MD   PCP: No PCP Per Patient   Chief Complaint: Chest pain  HPI: Craig Knight is a 48 y.o. male with past medical history of DM 2, HTN and tobacco abuse. Patient has history of chronic intermittent substernal chest pain. Came in to the hospital with chest pain for the past 5 days. Patient said pain started 5 days ago, right-sided to substernal in location, mild to moderate, 4-6/10 in intensity, no radiation, denies nausea, vomiting or diaphoresis. He used to have the same pain which last for 2-3 minutes but now lasts for about 30 minutes. Patient was on medications for HTN and DM but he said for the past 2 years she did not take any. He does have insurance through his wife that he does not see a primary care physician. In the ED his initial workup looks okay including CMP, CBC and troponin/EKG. Patient will be admitted to the hospital overnight to rule out ACS.  Review of Systems:  Constitutional: negative for anorexia, fevers and sweats Eyes: negative for irritation, redness and visual disturbance Ears, nose, mouth, throat, and face: negative for earaches, epistaxis, nasal congestion and sore throat Respiratory: negative for cough, dyspnea on exertion, sputum and wheezing Cardiovascular: Per HPI Gastrointestinal: negative for abdominal pain, constipation, diarrhea, melena, nausea and vomiting Genitourinary:negative for dysuria, frequency and hematuria Hematologic/lymphatic: negative for bleeding, easy bruising and lymphadenopathy Musculoskeletal:negative for arthralgias, muscle weakness and stiff joints Neurological: negative for coordination problems, gait problems, headaches and weakness Endocrine: negative for diabetic symptoms including polydipsia, polyuria and weight loss Allergic/Immunologic: negative for anaphylaxis, hay fever and  urticaria  Past Medical History  Diagnosis Date  . Hypertension   . Asthma   . Bronchitis    History reviewed. No pertinent past surgical history. Social History:   reports that he has been smoking Cigarettes.  He has been smoking about 0.00 packs per day. He does not have any smokeless tobacco history on file. He reports that he drinks alcohol. He reports that he does not use illicit drugs.  No Known Allergies  History reviewed. No pertinent family history.   Prior to Admission medications   Medication Sig Start Date End Date Taking? Authorizing Provider  amLODipine (NORVASC) 10 MG tablet Take 10 mg by mouth daily.   Yes Historical Provider, MD  Ascorbic Acid (VITAMIN C PO) Take 1 tablet by mouth 2 (two) times daily.   Yes Historical Provider, MD  Cyanocobalamin (VITAMIN B-12 PO) Take 1 tablet by mouth 2 (two) times daily.   Yes Historical Provider, MD  metoprolol (LOPRESSOR) 100 MG tablet Take 1 tablet (100 mg total) by mouth 2 (two) times daily. 04/15/13  Yes Maudry Diego, MD   Physical Exam: Filed Vitals:   09/15/13 1336  BP: 161/106  Pulse: 54  Temp:   Resp: 18   Constitutional: Oriented to person, place, and time. Well-developed and well-nourished. Cooperative.  Head: Normocephalic and atraumatic.  Nose: Nose normal.  Mouth/Throat: Uvula is midline, oropharynx is clear and moist and mucous membranes are normal.  Eyes: Conjunctivae and EOM are normal. Pupils are equal, round, and reactive to light.  Neck: Trachea normal and normal range of motion. Neck supple.  Cardiovascular: Normal rate, regular rhythm, S1 normal, S2 normal, normal heart sounds and intact distal pulses.   Pulmonary/Chest: Effort normal and breath sounds normal.  Abdominal: Soft. Bowel sounds are normal. There is  no hepatosplenomegaly. There is no tenderness.  Musculoskeletal: Normal range of motion.  Neurological: Alert and oriented to person, place, and time.  has normal strength. No cranial nerve  deficit or sensory deficit.  Skin: Skin is warm, dry and intact.  Psychiatric: Has a normal mood and affect. Speech is normal and behavior is normal.   Labs on Admission:  Basic Metabolic Panel:  Recent Labs Lab 09/15/13 0956  NA 144  K 4.1  CL 105  CO2 29  GLUCOSE 79  BUN 17  CREATININE 1.35  CALCIUM 9.6   Liver Function Tests:  Recent Labs Lab 09/15/13 0956  AST 14  ALT 21  ALKPHOS 103  BILITOT <0.2*  PROT 7.6  ALBUMIN 4.4   No results found for this basename: LIPASE, AMYLASE,  in the last 168 hours No results found for this basename: AMMONIA,  in the last 168 hours CBC:  Recent Labs Lab 09/15/13 0956  WBC 7.6  HGB 15.3  HCT 44.8  MCV 79.7  PLT 193   Cardiac Enzymes:  Recent Labs Lab 09/15/13 0956  TROPONINI <0.30    BNP (last 3 results)  Recent Labs  09/15/13 0956  PROBNP 69.0   CBG: No results found for this basename: GLUCAP,  in the last 168 hours  Radiological Exams on Admission: Ct Head Wo Contrast  09/15/2013   CLINICAL DATA:  48 year old male with headache. Chest pain. Initial encounter.  EXAM: CT HEAD WITHOUT CONTRAST  TECHNIQUE: Contiguous axial images were obtained from the base of the skull through the vertex without intravenous contrast.  COMPARISON:  04/15/2013 head CT and earlier.  FINDINGS: Chronic mild paranasal sinus mucosal thickening and inferior mastoid effusions. Sinuses and mastoids appear stable. No acute osseous abnormality identified. Visualized orbits and scalp soft tissues are within normal limits.  Mild Calcified atherosclerosis at the skull base. Stable cerebral volume. No ventriculomegaly. No midline shift, mass effect, or evidence of intracranial mass lesion. No acute intracranial hemorrhage identified. 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 Suggestion of small area of cortical encephalomalacia in the anterior right frontal lobe ACA territory, less likely normal anatomic  variation of the sulcus, also stable. No evidence of cortically based acute infarction identified. No suspicious intracranial vascular hyperdensity.  IMPRESSION: Stable chronic small vessel ischemia. No acute intracranial abnormality.   Electronically Signed   By: Lars Pinks M.D.   On: 09/15/2013 12:21   Dg Chest Portable 1 View  09/15/2013   CLINICAL DATA:  Chest pain, headache  EXAM: PORTABLE CHEST - 1 VIEW  COMPARISON:  Prior chest x-ray 04/15/2013  FINDINGS: Low inspiratory volumes. The lungs are otherwise clear. Stable cardiac and mediastinal contours. Heart is upper limits of normal in size. No pleural effusion or pneumothorax. No acute osseous abnormality.  IMPRESSION: 1. No acute cardiopulmonary process. 2. Stable cardiomegaly.   Electronically Signed   By: Jacqulynn Cadet M.D.   On: 09/15/2013 10:20    EKG: Independently reviewed.   Assessment/Plan Principal Problem:   Chest pain Active Problems:   Hypertension   Tobacco abuse   DM type 2 (diabetes mellitus, type 2)    Chest pain Atypical chest pain as he is right sided, but its heaviness and relieved by nitroglycerin. We will rule out acute coronary syndrome by cycling 3 sets of cardiac enzymes and repeat 12-lead EKG. Patient has had negative stress test on 08/02/2011. Patient has some abdominal discomfort and tenderness, obtain abdominal x-ray.  Chronic diastolic CHF 2-D  echo and 08/02/2011 showed grade 2 diastolic dysfunction, repeat the echocardiogram. Patient does NOT have symptoms or signs suggesting acute CHF.  Hypertension Accelerated hypertension, patient is not taking his medications. Restarted amlodipine at 5 mg, restarted metoprolol at lower dose as his heart rate in the 50s. I started lisinopril 5 mg as he does have diabetes mellitus type 2.  DM 2 Not taking any medications, started on SSI and carbohydrate modified diet. Check hemoglobin A1c, start on oral hypoglycemic or insulin accordingly.  Tobacco  abuse Patient counseled extensively.   Code Status: Full code Family Communication: Plan discussed with the patient Disposition Plan: Observation, telemetry  Time spent: 70 minutes  [facility] Pager [phone]

## 2013-09-15 NOTE — ED Provider Notes (Signed)
CSN: [id number]     Arrival date & time 09/15/13  0945 History  This chart was scribed for Craig Sorrow, MD by Peyton Bottoms, ED Scribe. This patient was seen in room APA11/APA11 and the patient's care was started at 10:48 AM.  Chief Complaint  Patient presents with  . Chest Pain   Patient is a 48 y.o. male presenting with chest pain. The history is provided by the patient. No language interpreter was used.  Chest Pain Pain location:  Substernal area Pain quality: radiating (left arm) and sharp   Pain radiates to:  L arm Pain radiates to the back: no   Pain severity:  Unable to specify Onset quality:  Unable to specify Duration:  6 days Timing:  Intermittent Progression:  Waxing and waning Chronicity:  Recurrent Relieved by:  Nothing Worsened by:  Nothing tried Ineffective treatments: 81 mg Aspirin. Associated symptoms: abdominal pain, cough, diaphoresis, dizziness, fever (cough), headache, nausea, shortness of breath and vomiting   Associated symptoms: no back pain   Risk factors: hypertension, male sex and smoking   Risk factors comment:  Father with Hx of open heart surgery  HPI Comments: Craig Knight is a 48 y.o. male with a history of asthma who presents to the Emergency Department complaining of intermittent right substernal chest pain onset 6 days ago, which worsened today. Pt states that he has been having episodes of chest pain that last anywhere from "minutes to hours". His current episode of chest pain that brought him to the ED today has been been lasting 2 hours. Patient states CP sometimes radiates to left arm, but he denies radiation elsewhere. Patient states associated SOB at times, but not consistent with each episode. Pt also reports associated nausea and episodes of emesis. Pt states that his father had open heart surgery at age 39. Patient states he ran out of BP medication 5 days ago. Pt's blood pressure taken in triage today was 187/134. Pt is a current every  day smoker.   Patient is not currently seen by a PCP  Past Medical History  Diagnosis Date  . Hypertension   . Asthma   . Bronchitis    History reviewed. No pertinent past surgical history. History reviewed. No pertinent family history. History  Substance Use Topics  . Smoking status: Current Every Day Smoker    Types: Cigarettes  . Smokeless tobacco: Not on file  . Alcohol Use: Yes     Comment: occ    Review of Systems  Constitutional: Positive for fever (cough), chills and diaphoresis.  HENT: Positive for rhinorrhea and sore throat.   Eyes: Positive for visual disturbance (visual blurring).  Respiratory: Positive for cough and shortness of breath.   Cardiovascular: Positive for chest pain and leg swelling.  Gastrointestinal: Positive for nausea, vomiting, abdominal pain and diarrhea.  Genitourinary: Negative for dysuria.  Musculoskeletal: Negative for back pain and neck pain.  Skin: Negative for rash.  Neurological: Positive for dizziness and headaches.  Hematological: Does not bruise/bleed easily.  Psychiatric/Behavioral: Negative for confusion.   Allergies  Review of patient's allergies indicates no known allergies.  Home Medications   Prior to Admission medications   Medication Sig Start Date End Date Taking? Authorizing Provider  amLODipine (NORVASC) 10 MG tablet Take 10 mg by mouth daily.   Yes Historical Provider, MD  Ascorbic Acid (VITAMIN C PO) Take 1 tablet by mouth 2 (two) times daily.   Yes Historical Provider, MD  Cyanocobalamin (VITAMIN B-12 PO) Take 1 tablet  by mouth 2 (two) times daily.   Yes Historical Provider, MD  metoprolol (LOPRESSOR) 100 MG tablet Take 1 tablet (100 mg total) by mouth 2 (two) times daily. 04/15/13  Yes Maudry Diego, MD   Triage Vitals: BP 187/134  Pulse 78  Temp(Src) 97.9 F (36.6 C) (Oral)  Resp 18  Ht 5\' 7"  (1.702 m)  Wt 225 lb (102.059 kg)  BMI 35.23 kg/m2  SpO2 99%  Physical Exam  Nursing note and vitals  reviewed. Constitutional: He is oriented to person, place, and time. He appears well-developed and well-nourished. No distress.  HENT:  Head: Normocephalic and atraumatic.  Mucous membranes moist  Eyes: Conjunctivae and EOM are normal.  Neck: Neck supple. No tracheal deviation present.  Cardiovascular: Normal rate and regular rhythm.   Pulmonary/Chest: Effort normal and breath sounds normal. No respiratory distress. He has no wheezes. He has no rales.  Lungs CTA  Abdominal: Bowel sounds are normal. There is no tenderness.  Musculoskeletal: Normal range of motion. He exhibits no edema.  No swelling in ankles  Neurological: He is alert and oriented to person, place, and time.  Skin: Skin is warm and dry.  Psychiatric: He has a normal mood and affect. His behavior is normal.    ED Course  Procedures (including critical care time)  DIAGNOSTIC STUDIES: Oxygen Saturation is 99%% on RA, Normal by my interpretation.    COORDINATION OF CARE: 10:52 AM- Discussed plan to order diagnostic imaging, and lab work. Discussed plan to order medications.  Pt advised of plan for treatment and pt agrees.   Medications  nitroGLYCERIN (NITROSTAT) SL tablet 0.4 mg (0.4 mg Sublingual Given 09/15/13 1126)  0.9 %  sodium chloride infusion (not administered)  aspirin chewable tablet 324 mg (324 mg Oral Given 09/15/13 1005)  metoprolol (LOPRESSOR) tablet 100 mg (100 mg Oral Given 09/15/13 1113)  ondansetron (ZOFRAN) injection 4 mg (4 mg Intravenous Given 09/15/13 1114)  morphine 2 MG/ML injection 2 mg (2 mg Intravenous Given 09/15/13 1309)   Results for orders placed during the hospital encounter of 09/15/13  CBC      Result Value Ref Range   WBC 7.6  4.0 - 10.5 K/uL   RBC 5.62  4.22 - 5.81 MIL/uL   Hemoglobin 15.3  13.0 - 17.0 g/dL   HCT 44.8  39.0 - 52.0 %   MCV 79.7  78.0 - 100.0 fL   MCH 27.2  26.0 - 34.0 pg   MCHC 34.2  30.0 - 36.0 g/dL   RDW 13.5  11.5 - 15.5 %   Platelets 193  150 - 400 K/uL   URINALYSIS, ROUTINE W REFLEX MICROSCOPIC      Result Value Ref Range   Color, Urine YELLOW  YELLOW   APPearance CLEAR  CLEAR   Specific Gravity, Urine 1.025  1.005 - 1.030   pH 5.5  5.0 - 8.0   Glucose, UA NEGATIVE  NEGATIVE mg/dL   Hgb urine dipstick NEGATIVE  NEGATIVE   Bilirubin Urine NEGATIVE  NEGATIVE   Ketones, ur NEGATIVE  NEGATIVE mg/dL   Protein, ur NEGATIVE  NEGATIVE mg/dL   Urobilinogen, UA 0.2  0.0 - 1.0 mg/dL   Nitrite NEGATIVE  NEGATIVE   Leukocytes, UA NEGATIVE  NEGATIVE  COMPREHENSIVE METABOLIC PANEL      Result Value Ref Range   Sodium 144  137 - 147 mEq/L   Potassium 4.1  3.7 - 5.3 mEq/L   Chloride 105  96 - 112 mEq/L   CO2  29  19 - 32 mEq/L   Glucose, Bld 79  70 - 99 mg/dL   BUN 17  6 - 23 mg/dL   Creatinine, Ser 1.35  0.50 - 1.35 mg/dL   Calcium 9.6  8.4 - 10.5 mg/dL   Total Protein 7.6  6.0 - 8.3 g/dL   Albumin 4.4  3.5 - 5.2 g/dL   AST 14  0 - 37 U/L   ALT 21  0 - 53 U/L   Alkaline Phosphatase 103  39 - 117 U/L   Total Bilirubin <0.2 (*) 0.3 - 1.2 mg/dL   GFR calc non Af Amer 61 (*) >90 mL/min   GFR calc Af Amer 70 (*) >90 mL/min   Anion gap 10  5 - 15  TROPONIN I      Result Value Ref Range   Troponin I <0.30  <0.30 ng/mL  PRO B NATRIURETIC PEPTIDE      Result Value Ref Range   Pro B Natriuretic peptide (BNP) 69.0  0 - 125 pg/mL   Imaging Review Ct Head Wo Contrast  09/15/2013   CLINICAL DATA:  48 year old male with headache. Chest pain. Initial encounter.  EXAM: CT HEAD WITHOUT CONTRAST  TECHNIQUE: Contiguous axial images were obtained from the base of the skull through the vertex without intravenous contrast.  COMPARISON:  04/15/2013 head CT and earlier.  FINDINGS: Chronic mild paranasal sinus mucosal thickening and inferior mastoid effusions. Sinuses and mastoids appear stable. No acute osseous abnormality identified. Visualized orbits and scalp soft tissues are within normal limits.  Mild Calcified atherosclerosis at the skull base. Stable  cerebral volume. No ventriculomegaly. No midline shift, mass effect, or evidence of intracranial mass lesion. No acute intracranial hemorrhage identified. 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 Suggestion of small area of cortical encephalomalacia in the anterior right frontal lobe ACA territory, less likely normal anatomic variation of the sulcus, also stable. No evidence of cortically based acute infarction identified. No suspicious intracranial vascular hyperdensity.  IMPRESSION: Stable chronic small vessel ischemia. No acute intracranial abnormality.   Electronically Signed   By: Lars Pinks M.D.   On: 09/15/2013 12:21   Dg Chest Portable 1 View  09/15/2013   CLINICAL DATA:  Chest pain, headache  EXAM: PORTABLE CHEST - 1 VIEW  COMPARISON:  Prior chest x-ray 04/15/2013  FINDINGS: Low inspiratory volumes. The lungs are otherwise clear. Stable cardiac and mediastinal contours. Heart is upper limits of normal in size. No pleural effusion or pneumothorax. No acute osseous abnormality.  IMPRESSION: 1. No acute cardiopulmonary process. 2. Stable cardiomegaly.   Electronically Signed   By: Jacqulynn Cadet M.D.   On: 09/15/2013 10:20     EKG Interpretation   Date/Time:  Wednesday September 15 2013 09:51:25 EDT Ventricular Rate:  79 PR Interval:  177 QRS Duration: 81 QT Interval:  356 QTC Calculation: 408 R Axis:   -32 Text Interpretation:  Sinus rhythm LAE, consider biatrial enlargement Left  ventricular hypertrophy Anterior infarct, old Abnormal T, consider  ischemia, diffuse leads No significant change since last tracing Confirmed  by Daphyne Miguez  MD, Daniyah Fohl [phone]) on 09/15/2013 9:55:22 AM      MDM   Final diagnoses:  Chest pain, unspecified chest pain type  Essential hypertension    Patient with a more intense chest pain starting about an hour half prior arrival. Has had some chest pain on and off for the past 3 days. Pain was right sided  occasionally does have  pain radiating to left arm. Also out of blood pressure meds for 5 days. Blood pressure high year. Patient given one dose Lopressor here. Head CT done for the headache it was negative. Chest x-ray without any acute findings. No evidence of pneumonia pulmonary edema pneumothorax. Patient troponin negative EKG without any acute changes compared to old. No evidence of acute cardiac event. Patient's pain in the chest completely resolved by aspirin and nitroglycerin. Discussed with hospitalist they will admit.     I personally performed the services described in this documentation, which was scribed in my presence. The recorded information has been reviewed and is accurate.       Craig Sorrow, MD 09/15/13 1350

## 2013-09-16 DIAGNOSIS — E1129 Type 2 diabetes mellitus with other diabetic kidney complication: Secondary | ICD-10-CM

## 2013-09-16 DIAGNOSIS — N058 Unspecified nephritic syndrome with other morphologic changes: Secondary | ICD-10-CM

## 2013-09-16 LAB — CBC
HCT: 42.9 % (ref 39.0–52.0)
Hemoglobin: 14.4 g/dL (ref 13.0–17.0)
MCH: 27.4 pg (ref 26.0–34.0)
MCHC: 33.6 g/dL (ref 30.0–36.0)
MCV: 81.6 fL (ref 78.0–100.0)
PLATELETS: 193 10*3/uL (ref 150–400)
RBC: 5.26 MIL/uL (ref 4.22–5.81)
RDW: 13.7 % (ref 11.5–15.5)
WBC: 8 10*3/uL (ref 4.0–10.5)

## 2013-09-16 LAB — BASIC METABOLIC PANEL
ANION GAP: 9 (ref 5–15)
BUN: 21 mg/dL (ref 6–23)
CHLORIDE: 103 meq/L (ref 96–112)
CO2: 28 mEq/L (ref 19–32)
Calcium: 9 mg/dL (ref 8.4–10.5)
Creatinine, Ser: 1.4 mg/dL — ABNORMAL HIGH (ref 0.50–1.35)
GFR, EST AFRICAN AMERICAN: 67 mL/min — AB (ref >90)
GFR, EST NON AFRICAN AMERICAN: 58 mL/min — AB (ref >90)
Glucose, Bld: 130 mg/dL — ABNORMAL HIGH (ref 70–99)
POTASSIUM: 4.3 meq/L (ref 3.7–5.3)
SODIUM: 140 meq/L (ref 137–147)

## 2013-09-16 LAB — TROPONIN I: Troponin I: <0.3 ng/mL (ref <0.30)

## 2013-09-16 LAB — GLUCOSE, CAPILLARY
GLUCOSE-CAPILLARY: 104 mg/dL — AB (ref 70–99)
GLUCOSE-CAPILLARY: 129 mg/dL — AB (ref 70–99)
Glucose-Capillary: 113 mg/dL — ABNORMAL HIGH (ref 70–99)
Glucose-Capillary: 108 mg/dL — ABNORMAL HIGH (ref 70–99)

## 2013-09-16 LAB — HEMOGLOBIN A1C
Hgb A1c MFr Bld: 6.5 % — ABNORMAL HIGH (ref <5.7)
Mean Plasma Glucose: 140 mg/dL — ABNORMAL HIGH (ref <117)

## 2013-09-16 MED ORDER — SIMVASTATIN 20 MG PO TABS
20.0000 mg | ORAL_TABLET | Freq: Every day | ORAL | Status: DC
  Administered 2013-09-16: 20 mg via ORAL
  Filled 2013-09-16: qty 1

## 2013-09-16 NOTE — Progress Notes (Signed)
[facility] PROGRESS NOTE   Craig Knight MBT:[id number] DOB: [birth year] DOA: 09/15/2013 PCP: No PCP Per Patient  HPI/Subjective: Feels much better today, still has some pain down his rib cage  Assessment/Plan: Principal Problem:   Chest pain Active Problems:   Hypertension   Tobacco abuse   DM type 2 (diabetes mellitus, type 2)    Chest pain  Atypical chest pain as he is right sided, but its heaviness and relieved by nitroglycerin.  We will rule out acute coronary syndrome by cycling 3 sets of cardiac enzymes and repeat 12-lead EKG.  Patient has had negative stress test on 08/02/2011.  Abdominal x-ray without acute findings. Patient has tenderness across his rib cage likely from cough.  Chronic diastolic CHF  2-D echo and 08/02/2011 showed grade 2 diastolic dysfunction, repeat the echocardiogram.  Patient does NOT have symptoms or signs suggesting acute CHF.  Curbside cardiology, patient will be scheduled to be seen as outpatient  AKI Creatinine was 1.35 on presentation, today is 1.4, patient started on lisinopril yesterday. Continue ACE inhibitors, check BMP, cannot rule out CKD, 1.35 PT high as possible normal creatinine for his weight.  Hypertension  Accelerated hypertension, patient is not taking his medications.  Restarted amlodipine at 5 mg, restarted metoprolol at lower dose as his heart rate in the 50s.  I started lisinopril 5 mg as he does have diabetes mellitus type 2.   DM 2  Not taking any medications, started on SSI and carbohydrate modified diet.  Check hemoglobin A1c, start on oral hypoglycemic or insulin accordingly.  Check lipid profile and start on standing as well.  Tobacco abuse  Patient counseled extensively.   Code Status: Full code Family Communication: Plan discussed with the patient. Disposition Plan: Remains inpatient   Consultants:  None  Procedures:  2-D echo still pending  Antibiotics:  None   Objective: Filed  Vitals:   09/16/13 0911  BP: 133/90  Pulse: 65  Temp:   Resp:     Intake/Output Summary (Last 24 hours) at 09/16/13 1222 Last data filed at 09/16/13 0800  Gross per 24 hour  Intake    720 ml  Output      0 ml  Net    720 ml   Filed Weights   09/15/13 0953 09/15/13 1550  Weight: 102.059 kg (225 lb) 92.987 kg (205 lb)    Exam: General: Alert and awake, oriented x3, not in any acute distress. HEENT: anicteric sclera, pupils reactive to light and accommodation, EOMI CVS: S1-S2 clear, no murmur rubs or gallops Chest: clear to auscultation bilaterally, no wheezing, rales or rhonchi Abdomen: soft nontender, nondistended, normal bowel sounds, no organomegaly Extremities: no cyanosis, clubbing or edema noted bilaterally Neuro: Cranial nerves II-XII intact, no focal neurological deficits  Data Reviewed: Basic Metabolic Panel:  Recent Labs Lab 09/15/13 0956 09/16/13 0324  NA 144 140  K 4.1 4.3  CL 105 103  CO2 29 28  GLUCOSE 79 130*  BUN 17 21  CREATININE 1.35 1.40*  CALCIUM 9.6 9.0   Liver Function Tests:  Recent Labs Lab 09/15/13 0956  AST 14  ALT 21  ALKPHOS 103  BILITOT <0.2*  PROT 7.6  ALBUMIN 4.4   No results found for this basename: LIPASE, AMYLASE,  in the last 168 hours No results found for this basename: AMMONIA,  in the last 168 hours CBC:  Recent Labs Lab 09/15/13 0956 09/16/13 0324  WBC 7.6 8.0  HGB 15.3 14.4  HCT 44.8 42.9  MCV 79.7 81.6  PLT 193 193   Cardiac Enzymes:  Recent Labs Lab 09/15/13 0956 09/15/13 1551 09/15/13 2110 09/16/13 0324  TROPONINI <0.30 <0.30 <0.30 <0.30   BNP (last 3 results)  Recent Labs  09/15/13 0956  PROBNP 69.0   CBG:  Recent Labs Lab 09/15/13 1702 09/15/13 2145 09/16/13 0719 09/16/13 1134  GLUCAP 104* 135* 113* 108*    Micro No results found for this or any previous visit (from the past 240 hour(s)).   Studies: Ct Head Wo Contrast  09/15/2013   CLINICAL DATA:  48 year old male with  headache. Chest pain. Initial encounter.  EXAM: CT HEAD WITHOUT CONTRAST  TECHNIQUE: Contiguous axial images were obtained from the base of the skull through the vertex without intravenous contrast.  COMPARISON:  04/15/2013 head CT and earlier.  FINDINGS: Chronic mild paranasal sinus mucosal thickening and inferior mastoid effusions. Sinuses and mastoids appear stable. No acute osseous abnormality identified. Visualized orbits and scalp soft tissues are within normal limits.  Mild Calcified atherosclerosis at the skull base. Stable cerebral volume. No ventriculomegaly. No midline shift, mass effect, or evidence of intracranial mass lesion. No acute intracranial hemorrhage identified. 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 Suggestion of small area of cortical encephalomalacia in the anterior right frontal lobe ACA territory, less likely normal anatomic variation of the sulcus, also stable. No evidence of cortically based acute infarction identified. No suspicious intracranial vascular hyperdensity.  IMPRESSION: Stable chronic small vessel ischemia. No acute intracranial abnormality.   Electronically Signed   By: Lars Pinks M.D.   On: 09/15/2013 12:21   Dg Chest Portable 1 View  09/15/2013   CLINICAL DATA:  Chest pain, headache  EXAM: PORTABLE CHEST - 1 VIEW  COMPARISON:  Prior chest x-ray 04/15/2013  FINDINGS: Low inspiratory volumes. The lungs are otherwise clear. Stable cardiac and mediastinal contours. Heart is upper limits of normal in size. No pleural effusion or pneumothorax. No acute osseous abnormality.  IMPRESSION: 1. No acute cardiopulmonary process. 2. Stable cardiomegaly.   Electronically Signed   By: Jacqulynn Cadet M.D.   On: 09/15/2013 10:20   Dg Abd 2 Views  09/15/2013   CLINICAL DATA:  Abdominal pain, bloating.  EXAM: ABDOMEN - 2 VIEW  COMPARISON:  None.  FINDINGS: The bowel gas pattern is normal. There is no evidence of free air. No  radio-opaque calculi or other significant radiographic abnormality is seen.  IMPRESSION: No evidence of bowel obstruction or ileus.   Electronically Signed   By: Sabino Dick M.D.   On: 09/15/2013 16:31    Scheduled Meds: . amLODipine  5 mg Oral Daily  . heparin  5,000 Units Subcutaneous 3 times per day  . insulin aspart  0-9 Units Subcutaneous TID WC  . lisinopril  5 mg Oral Daily  . metoprolol  25 mg Oral BID  . pantoprazole  40 mg Oral Daily  . polyethylene glycol  17 g Oral Daily  . sodium chloride  3 mL Intravenous Q12H   Continuous Infusions:      Time spent: 35 minutes    [facility]  [facility] Pager [phone] If 7PM-7AM, please contact night-coverage at [email or web address], password [facility] 09/16/2013, 12:22 PM  LOS: 1 day

## 2013-09-17 DIAGNOSIS — I5032 Chronic diastolic (congestive) heart failure: Secondary | ICD-10-CM | POA: Diagnosis present

## 2013-09-17 DIAGNOSIS — I059 Rheumatic mitral valve disease, unspecified: Secondary | ICD-10-CM

## 2013-09-17 LAB — GLUCOSE, CAPILLARY
GLUCOSE-CAPILLARY: 132 mg/dL — AB (ref 70–99)
Glucose-Capillary: 93 mg/dL (ref 70–99)

## 2013-09-17 LAB — TSH: TSH: 1.79 u[IU]/mL (ref 0.350–4.500)

## 2013-09-17 LAB — BASIC METABOLIC PANEL
ANION GAP: 10 (ref 5–15)
BUN: 21 mg/dL (ref 6–23)
CALCIUM: 9 mg/dL (ref 8.4–10.5)
CO2: 27 mEq/L (ref 19–32)
CREATININE: 1.31 mg/dL (ref 0.50–1.35)
Chloride: 104 mEq/L (ref 96–112)
GFR calc Af Amer: 72 mL/min — ABNORMAL LOW (ref >90)
GFR calc non Af Amer: 62 mL/min — ABNORMAL LOW (ref >90)
Glucose, Bld: 122 mg/dL — ABNORMAL HIGH (ref 70–99)
Potassium: 4.1 mEq/L (ref 3.7–5.3)
SODIUM: 141 meq/L (ref 137–147)

## 2013-09-17 LAB — LIPID PANEL
Cholesterol: 204 mg/dL — ABNORMAL HIGH (ref 0–200)
HDL: 26 mg/dL — ABNORMAL LOW (ref >39)
LDL Cholesterol: UNDETERMINED mg/dL (ref 0–99)
Total CHOL/HDL Ratio: 7.8 RATIO
Triglycerides: 450 mg/dL — ABNORMAL HIGH (ref <150)
VLDL: UNDETERMINED mg/dL (ref 0–40)

## 2013-09-17 MED ORDER — AMLODIPINE BESYLATE 10 MG PO TABS: 10.0000 mg | ORAL_TABLET | Freq: Every day | ORAL | Status: DC

## 2013-09-17 MED ORDER — AMLODIPINE BESYLATE 5 MG PO TABS: 10.0000 mg | ORAL_TABLET | Freq: Every day | ORAL | Status: DC

## 2013-09-17 MED ORDER — LISINOPRIL 10 MG PO TABS: 10.0000 mg | ORAL_TABLET | Freq: Every day | ORAL | Status: DC

## 2013-09-17 MED ORDER — METFORMIN HCL 1000 MG PO TABS: 1000.0000 mg | ORAL_TABLET | Freq: Two times a day (BID) | ORAL | Status: DC

## 2013-09-17 MED ORDER — AMLODIPINE BESYLATE 5 MG PO TABS
5.0000 mg | ORAL_TABLET | Freq: Once | ORAL | Status: AC
  Administered 2013-09-17: 5 mg via ORAL
  Filled 2013-09-17: qty 1

## 2013-09-17 MED ORDER — SIMVASTATIN 20 MG PO TABS: 20.0000 mg | ORAL_TABLET | Freq: Every day | ORAL | Status: DC

## 2013-09-17 MED ORDER — METOPROLOL TARTRATE 25 MG PO TABS: 25.0000 mg | ORAL_TABLET | Freq: Two times a day (BID) | ORAL | Status: DC

## 2013-09-17 NOTE — Progress Notes (Signed)
  Echocardiogram 2D Echocardiogram has been performed.  [employer], [place] 09/17/2013, 2:12 PM

## 2013-09-17 NOTE — Discharge Summary (Signed)
Physician Discharge Summary  Craig Knight GLO:[id number] DOB: 04-15-1965 DOA: 09/15/2013  PCP: No PCP Per Patient  Admit date: 09/15/2013 Discharge date: 09/17/2013  Time spent: 40 minutes  Recommendations for Outpatient Follow-up:  1. Follow up with [facility], cardiology division as indicated. 2. Check BMP as patient started on lisinopril.  Discharge Diagnoses:  Principal Problem:   Chest pain Active Problems:   Hypertension   Tobacco abuse   DM type 2 (diabetes mellitus, type 2)   Discharge Condition: Stable  Diet recommendation: Carb modified diet  Filed Weights   09/15/13 0953 09/15/13 1550  Weight: 102.059 kg (225 lb) 92.987 kg (205 lb)    History of present illness:  Craig Knight is a 48 y.o. male with past medical history of DM 2, HTN and tobacco abuse. Patient has history of chronic intermittent substernal chest pain. Came in to the hospital with chest pain for the past 5 days. Patient said pain started 5 days ago, right-sided to substernal in location, mild to moderate, 4-6/10 in intensity, no radiation, denies nausea, vomiting or diaphoresis. He used to have the same pain which last for 2-3 minutes but now lasts for about 30 minutes. Patient was on medications for HTN and DM but he said for the past 2 years she did not take any. He does have insurance through his wife that he does not see a primary care physician.  In the ED his initial workup looks okay including CMP, CBC and troponin/EKG. Patient will be admitted to the hospital overnight to rule out ACS.   Hospital Course:   Chest pain  Atypical chest pain as he is right sided, but its heaviness and relieved by nitroglycerin.  ACS ruled out by negative 3 sets of cardiac enzymes and repeat 12-lead EKG.  Patient has had negative stress test on 08/02/2011.  Abdominal x-ray without acute findings. Patient has tenderness across his rib cage likely from cough.  Chest pain is likely secondary to  musculoskeletal pain.  Chronic diastolic CHF  2-D echo and 08/02/2011 showed grade 2 diastolic dysfunction, repeat the echocardiogram, pending on discharge.  Patient does NOT have symptoms or signs suggesting acute CHF.  Curbside cardiology, patient will be scheduled to be seen as outpatient. Patient discharged on metoprolol, lisinopril and amlodipine, no need for diuretics as there is no falling overload.  AKI  Creatinine was 1.35 on presentation, today is 1.4, patient started on lisinopril yesterday.  Continue ACE inhibitors, check BMP, cannot rule out CKD, 1.35 PT high as possible normal creatinine for his weight.  Follow creatinine closely as patient started on lisinopril.  Hypertension  Accelerated hypertension, patient is not taking his medications.  Restarted amlodipine at 5 mg, restarted metoprolol at lower dose as his heart rate in the 50s.  Hard to control diabetes, started on lisinopril 10 mg, amlodipine 10 mg and metoprolol 25 mg twice a day. Patient describes episodes of choking at night, he probably will benefit from outpatient polysomnogram to rule out obstructive sleep apnea.  DM 2  Not taking any medications, started on SSI and carbohydrate modified diet.  Hemoglobin A1c is 6.5, started on 1000 mg of metformin twice a day Discharge on simvastatin 20 mg. Counseled about weight loss.  Tobacco abuse  Patient counseled extensively.   Procedures:  None, 2 D echo pending  Consultations:  None  Discharge Exam: Filed Vitals:   09/17/13 1121  BP: 176/105  Pulse:   Temp:   Resp:    General:  Alert and awake, oriented x3, not in any acute distress. HEENT: anicteric sclera, pupils reactive to light and accommodation, EOMI CVS: S1-S2 clear, no murmur rubs or gallops Chest: clear to auscultation bilaterally, no wheezing, rales or rhonchi Abdomen: soft nontender, nondistended, normal bowel sounds, no organomegaly Extremities: no cyanosis, clubbing or edema noted  bilaterally Neuro: Cranial nerves II-XII intact, no focal neurological deficits  Discharge Instructions You were cared for by a hospitalist during your hospital stay. If you have any questions about your discharge medications or the care you received while you were in the hospital after you are discharged, you can call the unit and asked to speak with the hospitalist on call if the hospitalist that took care of you is not available. Once you are discharged, your primary care physician will handle any further medical issues. Please note that NO REFILLS for any discharge medications will be authorized once you are discharged, as it is imperative that you return to your primary care physician (or establish a relationship with a primary care physician if you do not have one) for your aftercare needs so that they can reassess your need for medications and monitor your lab values.  Discharge Instructions   Diet Carb Modified    Complete by:  As directed      Increase activity slowly    Complete by:  As directed             Medication List    STOP taking these medications       VITAMIN B-12 PO     VITAMIN C PO      TAKE these medications       amLODipine 10 MG tablet  Commonly known as:  NORVASC  Take 1 tablet (10 mg total) by mouth daily.     lisinopril 10 MG tablet  Commonly known as:  PRINIVIL,ZESTRIL  Take 1 tablet (10 mg total) by mouth daily.     metFORMIN 1000 MG tablet  Commonly known as:  GLUCOPHAGE  Take 1 tablet (1,000 mg total) by mouth 2 (two) times daily with a meal.     metoprolol tartrate 25 MG tablet  Commonly known as:  LOPRESSOR  Take 1 tablet (25 mg total) by mouth 2 (two) times daily.     simvastatin 20 MG tablet  Commonly known as:  ZOCOR  Take 1 tablet (20 mg total) by mouth daily at 6 PM.       No Known Allergies     Follow-up Information   Follow up with Arnoldo Lenis, MD On 09/30/2013. (at 11:40 am at the new [place] office [address])     Specialty:  Cardiology   Contact information:   [address] [place] [address] [phone]        The results of significant diagnostics from this hospitalization (including imaging, microbiology, ancillary and laboratory) are listed below for reference.    Significant Diagnostic Studies: Ct Head Wo Contrast  09/15/2013   CLINICAL DATA:  48 year old male with headache. Chest pain. Initial encounter.  EXAM: CT HEAD WITHOUT CONTRAST  TECHNIQUE: Contiguous axial images were obtained from the base of the skull through the vertex without intravenous contrast.  COMPARISON:  04/15/2013 head CT and earlier.  FINDINGS: Chronic mild paranasal sinus mucosal thickening and inferior mastoid effusions. Sinuses and mastoids appear stable. No acute osseous abnormality identified. Visualized orbits and scalp soft tissues are within normal limits.  Mild Calcified atherosclerosis at the skull  base. Stable cerebral volume. No ventriculomegaly. No midline shift, mass effect, or evidence of intracranial mass lesion. No acute intracranial hemorrhage identified. 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 Suggestion of small area of cortical encephalomalacia in the anterior right frontal lobe ACA territory, less likely normal anatomic variation of the sulcus, also stable. No evidence of cortically based acute infarction identified. No suspicious intracranial vascular hyperdensity.  IMPRESSION: Stable chronic small vessel ischemia. No acute intracranial abnormality.   Electronically Signed   By: Lars Pinks M.D.   On: 09/15/2013 12:21   Dg Chest Portable 1 View  09/15/2013   CLINICAL DATA:  Chest pain, headache  EXAM: PORTABLE CHEST - 1 VIEW  COMPARISON:  Prior chest x-ray 04/15/2013  FINDINGS: Low inspiratory volumes. The lungs are otherwise clear. Stable cardiac and mediastinal contours. Heart is upper limits of normal in size. No pleural effusion or  pneumothorax. No acute osseous abnormality.  IMPRESSION: 1. No acute cardiopulmonary process. 2. Stable cardiomegaly.   Electronically Signed   By: Jacqulynn Cadet M.D.   On: 09/15/2013 10:20   Dg Abd 2 Views  09/15/2013   CLINICAL DATA:  Abdominal pain, bloating.  EXAM: ABDOMEN - 2 VIEW  COMPARISON:  None.  FINDINGS: The bowel gas pattern is normal. There is no evidence of free air. No radio-opaque calculi or other significant radiographic abnormality is seen.  IMPRESSION: No evidence of bowel obstruction or ileus.   Electronically Signed   By: Sabino Dick M.D.   On: 09/15/2013 16:31    Microbiology: No results found for this or any previous visit (from the past 240 hour(s)).   Labs: Basic Metabolic Panel:  Recent Labs Lab 09/15/13 0956 09/16/13 0324 09/17/13 0553  NA 144 140 141  K 4.1 4.3 4.1  CL 105 103 104  CO2 29 28 27   GLUCOSE 79 130* 122*  BUN 17 21 21   CREATININE 1.35 1.40* 1.31  CALCIUM 9.6 9.0 9.0   Liver Function Tests:  Recent Labs Lab 09/15/13 0956  AST 14  ALT 21  ALKPHOS 103  BILITOT <0.2*  PROT 7.6  ALBUMIN 4.4   No results found for this basename: LIPASE, AMYLASE,  in the last 168 hours No results found for this basename: AMMONIA,  in the last 168 hours CBC:  Recent Labs Lab 09/15/13 0956 09/16/13 0324  WBC 7.6 8.0  HGB 15.3 14.4  HCT 44.8 42.9  MCV 79.7 81.6  PLT 193 193   Cardiac Enzymes:  Recent Labs Lab 09/15/13 0956 09/15/13 1551 09/15/13 2110 09/16/13 0324  TROPONINI <0.30 <0.30 <0.30 <0.30   BNP: BNP (last 3 results)  Recent Labs  09/15/13 0956  PROBNP 69.0   CBG:  Recent Labs Lab 09/16/13 1134 09/16/13 1710 09/16/13 2139 09/17/13 0726 09/17/13 1125  GLUCAP 108* 104* 129* 132* 93       Signed:  Michale Weikel A  [facility] 09/17/2013, 12:03 PM

## 2013-09-30 ENCOUNTER — Encounter: Payer: BC Managed Care – PPO | Admitting: Cardiology

## 2013-09-30 NOTE — Progress Notes (Signed)
ERROR

## 2013-10-13 ENCOUNTER — Encounter: Payer: Self-pay | Admitting: Cardiology

## 2013-10-13 ENCOUNTER — Encounter: Payer: Self-pay | Admitting: *Deleted

## 2013-10-13 ENCOUNTER — Ambulatory Visit (INDEPENDENT_AMBULATORY_CARE_PROVIDER_SITE_OTHER): Payer: BC Managed Care – PPO | Admitting: Cardiology

## 2013-10-13 VITALS — BP 155/100 | HR 68 | Ht 67.0 in | Wt 216.0 lb

## 2013-10-13 DIAGNOSIS — E785 Hyperlipidemia, unspecified: Secondary | ICD-10-CM

## 2013-10-13 DIAGNOSIS — E1122 Type 2 diabetes mellitus with diabetic chronic kidney disease: Secondary | ICD-10-CM

## 2013-10-13 DIAGNOSIS — I10 Essential (primary) hypertension: Secondary | ICD-10-CM

## 2013-10-13 DIAGNOSIS — N189 Chronic kidney disease, unspecified: Secondary | ICD-10-CM

## 2013-10-13 DIAGNOSIS — R0789 Other chest pain: Secondary | ICD-10-CM

## 2013-10-13 DIAGNOSIS — Z79899 Other long term (current) drug therapy: Secondary | ICD-10-CM

## 2013-10-13 DIAGNOSIS — E1129 Type 2 diabetes mellitus with other diabetic kidney complication: Secondary | ICD-10-CM

## 2013-10-13 MED ORDER — ATORVASTATIN CALCIUM 40 MG PO TABS: 40.0000 mg | ORAL_TABLET | Freq: Every day | ORAL | Status: DC

## 2013-10-13 MED ORDER — METFORMIN HCL 500 MG PO TABS: 500.0000 mg | ORAL_TABLET | Freq: Two times a day (BID) | ORAL | Status: DC

## 2013-10-13 MED ORDER — ASPIRIN EC 81 MG PO TBEC: 81.0000 mg | DELAYED_RELEASE_TABLET | Freq: Every day | ORAL | Status: DC

## 2013-10-13 MED ORDER — LOSARTAN POTASSIUM 25 MG PO TABS: 25.0000 mg | ORAL_TABLET | Freq: Every day | ORAL | Status: DC

## 2013-10-13 MED ORDER — CHLORTHALIDONE 25 MG PO TABS: 25.0000 mg | ORAL_TABLET | Freq: Every day | ORAL | Status: DC

## 2013-10-13 NOTE — Progress Notes (Signed)
Clinical Summary Mr. Wavra is a 48 y.o.male seen today as a new patient for the following medical problems.  1. Noncardiac chest pain - history of chronic intermittent chest pain according to notes - recent admit 08/2013 with chest pain, atypical right sided pain, somewhat reproducible - no evidence of ACS by EKG or enzymes - prior negative stress test 07/2011 - echo 08/2013 LVEF 55-60%, no WMAs, grade II diastolic dysfunction with severe LVH  - episodes of mid to right sided chest pain. Can occur at rest or with exertion. Sharp pain, ranges 3-8/10. Worst with deep breathing, can be affected by position, worst with palpation. Pain lasts up to 30 minutes. No relation to food. Occurs daily, multiple times a day. Gets pain in hands, arms, bilateral knees, ankles.  - pain ongoing x 4 months.   2. HTN - history of medication non-compliance according to notes - checks at home daily, typically around 140s or greater/ 90s - compliant with meds, Started on lisionpril last admit, reports cough since starting   3. Tobacco - 25 years, < 1/2 ppd.   4. Chronic diastolic heart failure - echo 08/2013 LVEF 55-60%, severe LVH with grade II diastolic dysfunction - DOE at <1 block, ongoing for sometime now. + LE edema at times. Occas orthopnea and PND.    5. CKD    Past Medical History  Diagnosis Date  . Hypertension   . Asthma   . Bronchitis      No Known Allergies   Current Outpatient Prescriptions  Medication Sig Dispense Refill  . amLODipine (NORVASC) 10 MG tablet Take 1 tablet (10 mg total) by mouth daily.  30 tablet  0  . lisinopril (PRINIVIL,ZESTRIL) 10 MG tablet Take 1 tablet (10 mg total) by mouth daily.  30 tablet  0  . metFORMIN (GLUCOPHAGE) 1000 MG tablet Take 1 tablet (1,000 mg total) by mouth 2 (two) times daily with a meal.  60 tablet  0  . metoprolol (LOPRESSOR) 25 MG tablet Take 1 tablet (25 mg total) by mouth 2 (two) times daily.  60 tablet  1  . simvastatin  (ZOCOR) 20 MG tablet Take 1 tablet (20 mg total) by mouth daily at 6 PM.  30 tablet  0   No current facility-administered medications for this visit.     No past surgical history on file.   No Known Allergies    No family history on file.   Social History Mr. Moomaw reports that he has been smoking Cigarettes.  He has a 20 pack-year smoking history. He does not have any smokeless tobacco history on file. Mr. Hodes reports that he does not drink alcohol.   Review of Systems CONSTITUTIONAL: No weight loss, fever, chills, weakness or fatigue.  HEENT: Eyes: No visual loss, blurred vision, double vision or yellow sclerae.No hearing loss, sneezing, congestion, runny nose or sore throat.  SKIN: No rash or itching.  CARDIOVASCULAR: per HPI RESPIRATORY: + cough GASTROINTESTINAL: No anorexia, nausea, vomiting or diarrhea. No abdominal pain or blood.  GENITOURINARY: No burning on urination, no polyuria NEUROLOGICAL: No headache, dizziness, syncope, paralysis, ataxia, numbness or tingling in the extremities. No change in bowel or bladder control.  MUSCULOSKELETAL: No muscle, back pain,+ LE edema.  LYMPHATICS: No enlarged nodes. No history of splenectomy.  PSYCHIATRIC: No history of depression or anxiety.  ENDOCRINOLOGIC: No reports of sweating, cold or heat intolerance. No polyuria or polydipsia.  Marland Kitchen   Physical Examination p 68 bp 150/90 Wt 216 lbs  BMI 34 Gen: resting comfortably, no acute distress HEENT: no scleral icterus, pupils equal round and reactive, no palptable cervical adenopathy,  CV: RRR, no m/r/g, no JVD, no carotid bruits Resp: Clear to auscultation bilaterally GI: abdomen is soft, non-tender, non-distended, normal bowel sounds, no hepatosplenomegaly MSK: extremities are warm, no edema.  Skin: warm, no rash Neuro:  no focal deficits Psych: appropriate affect   Diagnostic Studies 08/2013 Echo Study Conclusions  - Left ventricle: The cavity size was normal.  Wall thickness was increased in a pattern of severe LVH. Systolic function was normal. The estimated ejection fraction was in the range of 55% to 60%. Wall motion was normal; there were no regional wall motion abnormalities. Features are consistent with a pseudonormal left ventricular filling pattern, with concomitant abnormal relaxation and increased filling pressure (grade 2 diastolic dysfunction). Doppler parameters are consistent with elevated ventricular end-diastolic filling pressure. - Mitral valve: There was mild regurgitation. - Right atrium: Central venous pressure (est): 3 mm Hg. - Atrial septum: No defect or patent foramen ovale was identified. - Tricuspid valve: There was trivial regurgitation. - Pulmonary arteries: Systolic pressure could not be accurately estimated. - Pericardium, extracardiac: There was no pericardial effusion.  Impressions:  - Severe LVH with LVEF 55-60% and grade 2 diastolic dysfunction with increased filling pressures. Mild mitral regurgitation. Trivial tricuspid regurgitation, unable to assess PASP.   07/2011 MPI Perfusion: There are no relative decreased counts on stress or  rest to suggest reversible ischemia or infarction. There is left  ventricular dilatation which is static from rest distress.  Wall motion: Hypokinesia of the inferior wall and septal wall.  Left ventricular ejection fraction: Calculated left ventricular  ejection fraction = 51%  IMPRESSION:  1. No reversible ischemia or infarction.  2. Septal and inferior wall hypokinesia.  3. Left ventricular dilatation.  4. Left ventricular ejection fraction equal 51%    Assessment and Plan  1. Non cardiac chest pain - long history of non-cardiac like chest pain, very atypical features including reproducible, variable in location, and affected by positioning - negative stress test in 2013, recent echo with normal LV function and no wall motion abnormalities - continue CAD risk  factor modification, start ASA 81mg  daily for primary prevention in DM patient.  2. HTN - elevated in clinic today - goal bp given his DM and CKD <130/80 - started on lisinopril during recent admit, reports cough since. Change to losartan 25mg  daily. - stop metoprolol as has not been able to be on higher doses due to low heart rates, and no secondary indication. Start diuretic with chlorthalidone 25mg  daily, repeat BMET in 2 weeks - he is to keep bp log and bring next visit  3. DM - he has no pcp - reports troubles with GI upset and diarrhea, may be due to metforming. Will decrease to 500mg  bid and follow symptoms - given list of local pcp's  4. Hyperlipidemia - on simvastatin. He is diabetic patient with at least moderate CAD risk, should at least be on moderate dose statin based on most recent guidelines. Change to atorva 40mg  daily   Due to recent admission, along with his chest pain and side effects to multiple medications recently started he has not been able to return to work. Will provide work excuse for hospital discharge up to today.   F/u 1 months  Arnoldo Lenis, M.D., F.A.C.C.

## 2013-10-13 NOTE — Patient Instructions (Addendum)
   Start Losartan 25mg  daily.  Start Chlorthalidone 25 mg daily.  Start Lipitor 40mg  daily.  Start Aspirin 81 mg daily.  Stop Metoprolol, lisinopril.  Decrease Metformin to 500mg  twice a day  Continue all other medications.   Labs for BMET in 2 weeks. Record blood pressure on log given Your physician wants you to follow up in:  1 month.  See above for appointment.

## 2013-11-11 ENCOUNTER — Other Ambulatory Visit: Payer: Self-pay | Admitting: Cardiology

## 2013-11-12 ENCOUNTER — Ambulatory Visit (INDEPENDENT_AMBULATORY_CARE_PROVIDER_SITE_OTHER): Payer: BC Managed Care – PPO | Admitting: Cardiology

## 2013-11-12 ENCOUNTER — Encounter: Payer: Self-pay | Admitting: Cardiology

## 2013-11-12 ENCOUNTER — Telehealth: Payer: Self-pay | Admitting: *Deleted

## 2013-11-12 VITALS — BP 149/98 | HR 82 | Ht 67.0 in | Wt 216.8 lb

## 2013-11-12 DIAGNOSIS — E1129 Type 2 diabetes mellitus with other diabetic kidney complication: Secondary | ICD-10-CM

## 2013-11-12 DIAGNOSIS — R404 Transient alteration of awareness: Secondary | ICD-10-CM

## 2013-11-12 DIAGNOSIS — E785 Hyperlipidemia, unspecified: Secondary | ICD-10-CM

## 2013-11-12 DIAGNOSIS — N189 Chronic kidney disease, unspecified: Secondary | ICD-10-CM

## 2013-11-12 DIAGNOSIS — R400 Somnolence: Secondary | ICD-10-CM

## 2013-11-12 DIAGNOSIS — R0789 Other chest pain: Secondary | ICD-10-CM

## 2013-11-12 DIAGNOSIS — Z79899 Other long term (current) drug therapy: Secondary | ICD-10-CM

## 2013-11-12 DIAGNOSIS — I10 Essential (primary) hypertension: Secondary | ICD-10-CM

## 2013-11-12 DIAGNOSIS — E1122 Type 2 diabetes mellitus with diabetic chronic kidney disease: Secondary | ICD-10-CM

## 2013-11-12 LAB — BASIC METABOLIC PANEL
BUN: 21 mg/dL (ref 6–23)
CALCIUM: 10.3 mg/dL (ref 8.4–10.5)
CO2: 28 mEq/L (ref 19–32)
CREATININE: 1.56 mg/dL — AB (ref 0.50–1.35)
Chloride: 104 mEq/L (ref 96–112)
GLUCOSE: 94 mg/dL (ref 70–99)
Potassium: 4 mEq/L (ref 3.5–5.3)
Sodium: 141 mEq/L (ref 135–145)

## 2013-11-12 MED ORDER — RANITIDINE HCL 150 MG PO TABS: 150.0000 mg | ORAL_TABLET | Freq: Two times a day (BID) | ORAL | Status: DC

## 2013-11-12 MED ORDER — LABETALOL HCL 200 MG PO TABS: 200.0000 mg | ORAL_TABLET | Freq: Two times a day (BID) | ORAL | Status: DC

## 2013-11-12 NOTE — Progress Notes (Signed)
Clinical Summary Craig Knight is a 48 y.o.male seen today for follow up of the following medical problems.   1. Noncardiac chest pain  - history of chronic intermittent chest pain according to notes  - recent admit 08/2013 with chest pain, atypical right sided pain, somewhat reproducible  - no evidence of ACS by EKG or enzymes  - prior negative stress test 07/2011  - echo 08/2013 LVEF 55-60%, no WMAs, grade II diastolic dysfunction with severe LVH  - episodes of mid to right sided chest pain. Can occur at rest or with exertion. Sharp pain, ranges 3-8/10. Worst with deep breathing, can be affected by position, worst with palpation. Pain lasts up to 30 minutes. No relation to food. Occurs daily, multiple times a day. Gets pain in hands, arms, bilateral knees, ankles.  - pain ongoing x 4 months.   2. HTN  - history of medication non-compliance according to notes. Recently he reports compliance - checks at home daily, typically around 140s or greater/ 80-90s  - last visit stopped lisinopril due to cough, changed to losartan 25mg  daily. Cough now resolved. Stopped metoprolol, started chlorthalidone 25mg  daily.  - repeat labs showed Cr increased from 1.31 to 1.56   3. Chronic diastolic heart failure  - echo 08/2013 LVEF 55-60%, severe LVH with grade II diastolic dysfunction  - DOE at <1 block, ongoing for sometime now. + LE edema at times. Occas orthopnea and PND.   5. Hyperlipidemia - last visit changed to atorva 40mg  daily  6. CKD - lisinopril changed to losartan last visit due to cough - Cr up after change and also after starting chlorthalidone  7. DM - stomach upset on higher doses of metformin, decreased to 500mg  bid last visit  with improved symptoms - he is still working on establishing with pcp, he has contacted Dr Janeann Forehand office   8. OSA? + snoring, + daytime somnolence, unsure of apneic episodes   Past Medical History  Diagnosis Date  . Hypertension   . Asthma   .  Bronchitis      Allergies  Allergen Reactions  . Dust Mite Extract      Current Outpatient Prescriptions  Medication Sig Dispense Refill  . amLODipine (NORVASC) 10 MG tablet Take 1 tablet (10 mg total) by mouth daily.  30 tablet  0  . aspirin EC 81 MG tablet Take 1 tablet (81 mg total) by mouth daily.      Marland Kitchen atorvastatin (LIPITOR) 40 MG tablet Take 1 tablet (40 mg total) by mouth daily.  30 tablet  6  . chlorthalidone (HYGROTON) 25 MG tablet Take 1 tablet (25 mg total) by mouth daily.  30 tablet  6  . losartan (COZAAR) 25 MG tablet Take 1 tablet (25 mg total) by mouth daily.  30 tablet  6  . metFORMIN (GLUCOPHAGE) 500 MG tablet Take 1 tablet (500 mg total) by mouth 2 (two) times daily.  60 tablet  1   No current facility-administered medications for this visit.     No past surgical history on file.   Allergies  Allergen Reactions  . Dust Mite Extract       No family history on file.   Social History Craig Knight reports that he has been smoking Cigarettes.  He has a 6.25 pack-year smoking history. He does not have any smokeless tobacco history on file. Craig Knight reports that he does not drink alcohol.   Review of Systems CONSTITUTIONAL: No weight loss, fever, chills,  weakness or fatigue.  HEENT: Eyes: No visual loss, blurred vision, double vision or yellow sclerae.No hearing loss, sneezing, congestion, runny nose or sore throat.  SKIN: No rash or itching.  CARDIOVASCULAR: per HPI RESPIRATORY: No shortness of breath, cough or sputum.  GASTROINTESTINAL: No anorexia, nausea, vomiting or diarrhea. No abdominal pain or blood.  GENITOURINARY: No burning on urination, no polyuria NEUROLOGICAL: No headache, dizziness, syncope, paralysis, ataxia, numbness or tingling in the extremities. No change in bowel or bladder control.  MUSCULOSKELETAL: No muscle, back pain, joint pain or stiffness.  LYMPHATICS: No enlarged nodes. No history of splenectomy.  PSYCHIATRIC: No history  of depression or anxiety.  ENDOCRINOLOGIC: No reports of sweating, cold or heat intolerance. No polyuria or polydipsia.  Marland Kitchen   Physical Examination p 82 bp 149/98 Wt 216 lbs BMI 34 Gen: resting comfortably, no acute distress HEENT: no scleral icterus, pupils equal round and reactive, no palptable cervical adenopathy,  CV: RRR, no m/r/g, no JVD, no carotid bruits Resp: Clear to auscultation bilaterally GI: abdomen is soft, non-tender, non-distended, normal bowel sounds, no hepatosplenomegaly MSK: extremities are warm, no edema.  Skin: warm, no rash Neuro:  no focal deficits Psych: appropriate affect   Diagnostic Studies 08/2013 Echo  Study Conclusions  - Left ventricle: The cavity size was normal. Wall thickness was increased in a pattern of severe LVH. Systolic function was normal. The estimated ejection fraction was in the range of 55% to 60%. Wall motion was normal; there were no regional wall motion abnormalities. Features are consistent with a pseudonormal left ventricular filling pattern, with concomitant abnormal relaxation and increased filling pressure (grade 2 diastolic dysfunction). Doppler parameters are consistent with elevated ventricular end-diastolic filling pressure. - Mitral valve: There was mild regurgitation. - Right atrium: Central venous pressure (est): 3 mm Hg. - Atrial septum: No defect or patent foramen ovale was identified. - Tricuspid valve: There was trivial regurgitation. - Pulmonary arteries: Systolic pressure could not be accurately estimated. - Pericardium, extracardiac: There was no pericardial effusion.  Impressions:  - Severe LVH with LVEF 55-60% and grade 2 diastolic dysfunction with increased filling pressures. Mild mitral regurgitation. Trivial tricuspid regurgitation, unable to assess PASP.  07/2011 MPI  Perfusion: There are no relative decreased counts on stress or  rest to suggest reversible ischemia or infarction. There is left    ventricular dilatation which is static from rest distress.  Wall motion: Hypokinesia of the inferior wall and septal wall.  Left ventricular ejection fraction: Calculated left ventricular  ejection fraction = 51%  IMPRESSION:  1. No reversible ischemia or infarction.  2. Septal and inferior wall hypokinesia.  3. Left ventricular dilatation.  4. Left ventricular ejection fraction equal 51%     Assessment and Plan  1. Non cardiac chest pain  - long history of non-cardiac like chest pain, very atypical features including reproducible, variable in location, and affected by positioning  - negative stress test in 2013, recent echo with normal LV function and no wall motion abnormalities  - continue CAD risk factor modification  2. HTN  - elevated in clinic today  - goal bp given his DM and CKD <130/80  - started on lisinopril during recent admit, reports cough since. Change to losartan 25mg  daily with resolution of cough - bump in Cr, will stop chlorthalidone and repeat BMET in 2 weeks. Start labetalol instead at 200mg  bid, avoid uptitrating ARB at this time.  - he is to keep bp log and bring next visit   3.  DM  - stomach upset resolved on lower doses of metforming - he is working to establish with pcp  4. Hyperlipidemia  - changed to atorva 40mg  daily, tolerating well. Continue current therapy  5. Chronic diastolic HF - appears euvolemic, continue efforts toward bp control - stop chlorthalidone due to Cr increase, may in future need prn diuretic  6. CKD - repeat BMET in 2 weeks after recent Cr increase as described above  7. OSA? - multiple symptoms of OSA, will refer to sleep medicine       Arnoldo Lenis, M.D., F.A.C.C.

## 2013-11-12 NOTE — Telephone Encounter (Signed)
Message copied by Orion Modest on Fri Nov 12, 2013  4:18 PM ------      Message from: Tuttle F      Created: Fri Nov 12, 2013  8:46 AM       Labs show that the new diuretic blood pressure medicine we started him on (chlorthalidone) may be too strong for him, and he may be getting a little dehydrated. Please have him decrease dose to 12.5mg  daily            Zandra Abts MD ------

## 2013-11-12 NOTE — Patient Instructions (Signed)
   Stop Chlorthalidone  Begin Labetalol 200mg  twice a day  - new sent to pharm  Begin OTC Zantac 150mg  twice a day  Continue all other medications.   Lab for BMET - due in 2 weeks, around 11/26/13 Office will contact with results via phone or letter.   Referral to Dr. Redmond Pulling for sleep study Continue to monitor blood pressure & bring log to next office visit for MD review. Follow up in  6 weeks

## 2013-11-12 NOTE — Telephone Encounter (Signed)
Pt was seen if office today

## 2013-12-08 ENCOUNTER — Emergency Department (HOSPITAL_COMMUNITY)
Admission: EM | Admit: 2013-12-08 | Discharge: 2013-12-08 | Disposition: A | Discharge status: Home / Self Care | Payer: BC Managed Care – PPO | Attending: Emergency Medicine | Admitting: Emergency Medicine

## 2013-12-08 ENCOUNTER — Encounter (HOSPITAL_COMMUNITY): Payer: Self-pay | Admitting: Emergency Medicine

## 2013-12-08 DIAGNOSIS — Z720 Tobacco use: Secondary | ICD-10-CM | POA: Diagnosis not present

## 2013-12-08 DIAGNOSIS — Z79899 Other long term (current) drug therapy: Secondary | ICD-10-CM | POA: Diagnosis not present

## 2013-12-08 DIAGNOSIS — J45909 Unspecified asthma, uncomplicated: Secondary | ICD-10-CM | POA: Diagnosis not present

## 2013-12-08 DIAGNOSIS — H5710 Ocular pain, unspecified eye: Secondary | ICD-10-CM | POA: Diagnosis present

## 2013-12-08 DIAGNOSIS — I10 Essential (primary) hypertension: Secondary | ICD-10-CM | POA: Diagnosis not present

## 2013-12-08 DIAGNOSIS — H16133 Photokeratitis, bilateral: Secondary | ICD-10-CM | POA: Diagnosis not present

## 2013-12-08 DIAGNOSIS — Z7982 Long term (current) use of aspirin: Secondary | ICD-10-CM | POA: Insufficient documentation

## 2013-12-08 MED ORDER — OXYCODONE-ACETAMINOPHEN 5-325 MG PO TABS: 1.0000 | ORAL_TABLET | ORAL | Status: DC | PRN

## 2013-12-08 MED ORDER — TETRACAINE HCL 0.5 % OP SOLN
OPHTHALMIC | Status: AC
  Administered 2013-12-08: 04:00:00 via OPHTHALMIC
  Filled 2013-12-08: qty 2

## 2013-12-08 MED ORDER — OXYCODONE-ACETAMINOPHEN 5-325 MG PO TABS
2.0000 | ORAL_TABLET | Freq: Once | ORAL | Status: AC
  Administered 2013-12-08: 2 via ORAL
  Filled 2013-12-08: qty 2

## 2013-12-08 NOTE — ED Notes (Signed)
Pt c/o eye pain since welding yesterday.

## 2013-12-08 NOTE — Discharge Instructions (Signed)
Percocet as prescribed as needed for pain.  Return to the ER if your symptoms substantially worsen or do not improve in the next 2 days.   Ultraviolet Keratitis Ultraviolet keratitis can occur when too much UV light enters the cornea. The cornea is the clear cover on the front part of your eye that helps focus light. It protects your eyes from dust and other foreign bodies and filters ultraviolet rays. This condition can be caused by exposure to snow (snow blindness) from the reflected or direct sunlight. It may also be caused by exposure to welding arcs or halogen lamps (flash burn) and prolonged exposure to direct sunlight. Brief exposure can result in severe problems several hours later. CAUSES  Causes of ultraviolet keratitis include:  Exposure to snow (snow blindness) from the reflected or direct sunlight.  Exposure to welding arcs or halogen lamps (flash burn).  Prolonged exposure to direct sunlight. SYMPTOMS  The symptoms of ultraviolet keratitis usually start 6 to 12 hours after the damage occurred. They may include the following:   Tearing.  Light sensitivity.  Gritty sensation in eyes.  Swelling of your eyelids.  Severe pain. In spite of the pain, this condition will usually improve within 24 hours even without treatment. HOME CARE INSTRUCTIONS   Apply cold packs on your eyes to ease the pain.  Only take over-the-counter or prescription medicines for pain, discomfort, or fever as directed by your caregiver.  Your caregiver may also prescribe an antibiotic ointment to help prevent infection and/or additional medications for pain relief.  Apply an eye patch to help relieve discomfort and assist in healing. If your caregiver patches your eyes, it is important to leave these patches on.  Follow the instructions given to you by your caregiver.  Do not rub your eyes.  If your caregiver has given you a follow-up appointment, it is very important to keep that appointment.  Not keeping the appointment could result in a severe eye infection or permanent loss of vision. If there is any problem keeping the appointment, you must call back to this facility for assistance. SEEK IMMEDIATE MEDICAL CARE IF:   Pain is severe and not relieved by medication.  Pain or problems with vision last more than 48 hours.  Exposure to light is unavoidable and you need extra protection for your eyes. MAKE SURE YOU:   Understand these instructions.  Will watch your condition.  Will get help right away if you are not doing well or get worse. Document Released: 02/11/2005 Document Revised: 06/28/2013 Document Reviewed: 09/18/2007 [facility] Patient Information 2015 [place], [place]. This information is not intended to replace advice given to you by your health care provider. Make sure you discuss any questions you have with your health care provider.

## 2013-12-08 NOTE — ED Provider Notes (Signed)
CSN: [id number]     Arrival date & time 12/08/13  0320 History   First MD Initiated Contact with Patient 12/08/13 [phone]     Chief Complaint  Patient presents with  . Eye Pain     (Consider location/radiation/quality/duration/timing/severity/associated sxs/prior Treatment) HPI Comments: Patient is a 48 year old male who presents with complaints of bilateral eye redness and irritation. He was apparently well yesterday and experienced a welder flash.  Patient is a 48 y.o. male presenting with eye pain. The history is provided by the patient.  Eye Pain This is a new problem. The current episode started 1 to 2 hours ago. The problem occurs constantly. The problem has been rapidly worsening. Nothing aggravates the symptoms.    Past Medical History  Diagnosis Date  . Hypertension   . Asthma   . Bronchitis    History reviewed. No pertinent past surgical history. History reviewed. No pertinent family history. History  Substance Use Topics  . Smoking status: Current Every Day Smoker -- 0.25 packs/day for 25 years    Types: Cigarettes  . Smokeless tobacco: Not on file  . Alcohol Use: No     Comment: occ    Review of Systems  Eyes: Positive for pain.  All other systems reviewed and are negative.     Allergies  Dust mite extract  Home Medications   Prior to Admission medications   Medication Sig Start Date End Date Taking? Authorizing Provider  amLODipine (NORVASC) 10 MG tablet Take 1 tablet (10 mg total) by mouth daily. 09/17/13  Yes Verlee Monte, MD  aspirin EC 81 MG tablet Take 1 tablet (81 mg total) by mouth daily. 10/13/13  Yes Arnoldo Lenis, MD  atorvastatin (LIPITOR) 40 MG tablet Take 1 tablet (40 mg total) by mouth daily. 10/13/13  Yes Arnoldo Lenis, MD  labetalol (NORMODYNE) 200 MG tablet Take 1 tablet (200 mg total) by mouth 2 (two) times daily. 11/12/13  Yes Arnoldo Lenis, MD  losartan (COZAAR) 25 MG tablet Take 1 tablet (25 mg total) by mouth daily. 10/13/13   Yes Arnoldo Lenis, MD  metFORMIN (GLUCOPHAGE) 500 MG tablet Take 1 tablet (500 mg total) by mouth 2 (two) times daily. 10/13/13  Yes Arnoldo Lenis, MD  ranitidine (ZANTAC) 150 MG tablet Take 1 tablet (150 mg total) by mouth 2 (two) times daily. 11/12/13  Yes Arnoldo Lenis, MD   BP 176/122  Pulse 70  Temp(Src) 97.8 F (36.6 C)  Resp 16  Ht 5\' 7"  (1.702 m)  Wt 210 lb (95.255 kg)  BMI 32.88 kg/m2  SpO2 98% Physical Exam  Nursing note and vitals reviewed. Constitutional: He appears well-developed and well-nourished. No distress.  HENT:  Head: Normocephalic and atraumatic.  Eyes:  The bilateral conjunctiva is injected. Corneas appear clear. Pupils are equally round and reactive.  Neck: Normal range of motion. Neck supple.  Skin: He is not diaphoretic.    ED Course  Procedures (including critical care time) Labs Review Labs Reviewed - No data to display  Imaging Review No results found.   EKG Interpretation None      MDM   Final diagnoses:  None    This appears to be a welder's flash. We'll treat with Percocet and when necessary followup.    Veryl Speak, MD 12/08/13 [phone]

## 2013-12-08 NOTE — ED Notes (Signed)
Discharge instructions given, pt demonstrated teach back and verbal understanding. No concerns voiced.  

## 2013-12-22 ENCOUNTER — Encounter: Payer: Self-pay | Admitting: Cardiology

## 2014-01-10 ENCOUNTER — Encounter: Payer: Self-pay | Admitting: Cardiology

## 2014-01-10 NOTE — Progress Notes (Signed)
ERROR

## 2014-01-11 ENCOUNTER — Encounter: Payer: BC Managed Care – PPO | Admitting: Cardiology

## 2014-01-13 ENCOUNTER — Encounter: Payer: BC Managed Care – PPO | Admitting: Cardiology

## 2014-01-13 NOTE — Progress Notes (Signed)
ERROR NO SHOW

## 2014-04-07 ENCOUNTER — Telehealth: Payer: Self-pay | Admitting: Cardiology

## 2014-06-28 ENCOUNTER — Emergency Department (HOSPITAL_COMMUNITY): Payer: BLUE CROSS/BLUE SHIELD

## 2014-06-28 ENCOUNTER — Encounter (HOSPITAL_COMMUNITY): Payer: Self-pay

## 2014-06-28 ENCOUNTER — Emergency Department (HOSPITAL_COMMUNITY)
Admission: EM | Admit: 2014-06-28 | Discharge: 2014-06-28 | Disposition: A | Discharge status: Home / Self Care | Payer: BLUE CROSS/BLUE SHIELD | Attending: Emergency Medicine | Admitting: Emergency Medicine

## 2014-06-28 DIAGNOSIS — Z7982 Long term (current) use of aspirin: Secondary | ICD-10-CM | POA: Insufficient documentation

## 2014-06-28 DIAGNOSIS — Z79899 Other long term (current) drug therapy: Secondary | ICD-10-CM | POA: Insufficient documentation

## 2014-06-28 DIAGNOSIS — Y9289 Other specified places as the place of occurrence of the external cause: Secondary | ICD-10-CM | POA: Diagnosis not present

## 2014-06-28 DIAGNOSIS — J45909 Unspecified asthma, uncomplicated: Secondary | ICD-10-CM | POA: Diagnosis not present

## 2014-06-28 DIAGNOSIS — W010XXA Fall on same level from slipping, tripping and stumbling without subsequent striking against object, initial encounter: Secondary | ICD-10-CM | POA: Diagnosis not present

## 2014-06-28 DIAGNOSIS — Z720 Tobacco use: Secondary | ICD-10-CM | POA: Diagnosis not present

## 2014-06-28 DIAGNOSIS — S3992XA Unspecified injury of lower back, initial encounter: Secondary | ICD-10-CM | POA: Insufficient documentation

## 2014-06-28 DIAGNOSIS — Y9389 Activity, other specified: Secondary | ICD-10-CM | POA: Insufficient documentation

## 2014-06-28 DIAGNOSIS — M545 Low back pain, unspecified: Secondary | ICD-10-CM

## 2014-06-28 DIAGNOSIS — I10 Essential (primary) hypertension: Secondary | ICD-10-CM | POA: Diagnosis not present

## 2014-06-28 DIAGNOSIS — W19XXXA Unspecified fall, initial encounter: Secondary | ICD-10-CM

## 2014-06-28 DIAGNOSIS — Y998 Other external cause status: Secondary | ICD-10-CM | POA: Diagnosis not present

## 2014-06-28 LAB — BASIC METABOLIC PANEL
ANION GAP: 7 (ref 5–15)
BUN: 20 mg/dL (ref 6–20)
CALCIUM: 9.1 mg/dL (ref 8.9–10.3)
CHLORIDE: 106 mmol/L (ref 101–111)
CO2: 27 mmol/L (ref 22–32)
CREATININE: 1.3 mg/dL — AB (ref 0.61–1.24)
GFR calc Af Amer: >60 mL/min (ref >60)
GFR calc non Af Amer: >60 mL/min (ref >60)
Glucose, Bld: 162 mg/dL — ABNORMAL HIGH (ref 70–99)
Potassium: 3.8 mmol/L (ref 3.5–5.1)
Sodium: 140 mmol/L (ref 135–145)

## 2014-06-28 LAB — CBC WITH DIFFERENTIAL/PLATELET
BASOS PCT: 1 % (ref 0–1)
Basophils Absolute: 0 10*3/uL (ref 0.0–0.1)
Eosinophils Absolute: 0.1 10*3/uL (ref 0.0–0.7)
Eosinophils Relative: 2 % (ref 0–5)
HEMATOCRIT: 45.1 % (ref 39.0–52.0)
Hemoglobin: 15.1 g/dL (ref 13.0–17.0)
Lymphocytes Relative: 17 % (ref 12–46)
Lymphs Abs: 1.5 10*3/uL (ref 0.7–4.0)
MCH: 27 pg (ref 26.0–34.0)
MCHC: 33.5 g/dL (ref 30.0–36.0)
MCV: 80.7 fL (ref 78.0–100.0)
Monocytes Absolute: 0.6 10*3/uL (ref 0.1–1.0)
Monocytes Relative: 7 % (ref 3–12)
Neutro Abs: 6.5 10*3/uL (ref 1.7–7.7)
Neutrophils Relative %: 73 % (ref 43–77)
Platelets: 201 10*3/uL (ref 150–400)
RBC: 5.59 MIL/uL (ref 4.22–5.81)
RDW: 13.8 % (ref 11.5–15.5)
WBC: 8.8 10*3/uL (ref 4.0–10.5)

## 2014-06-28 LAB — CBG MONITORING, ED: GLUCOSE-CAPILLARY: 191 mg/dL — AB (ref 70–99)

## 2014-06-28 LAB — TROPONIN I

## 2014-06-28 IMAGING — DX DG LUMBAR SPINE COMPLETE 4+V
5 series · 5 of 5 positions shown · non-contrast
Comparison: [DATE]

CLINICAL DATA: Fell [REDACTED] going down steps, landed on lower back,
lateral pain

EXAM:
LUMBAR SPINE - COMPLETE 4+ VIEW

[l-spine ap]
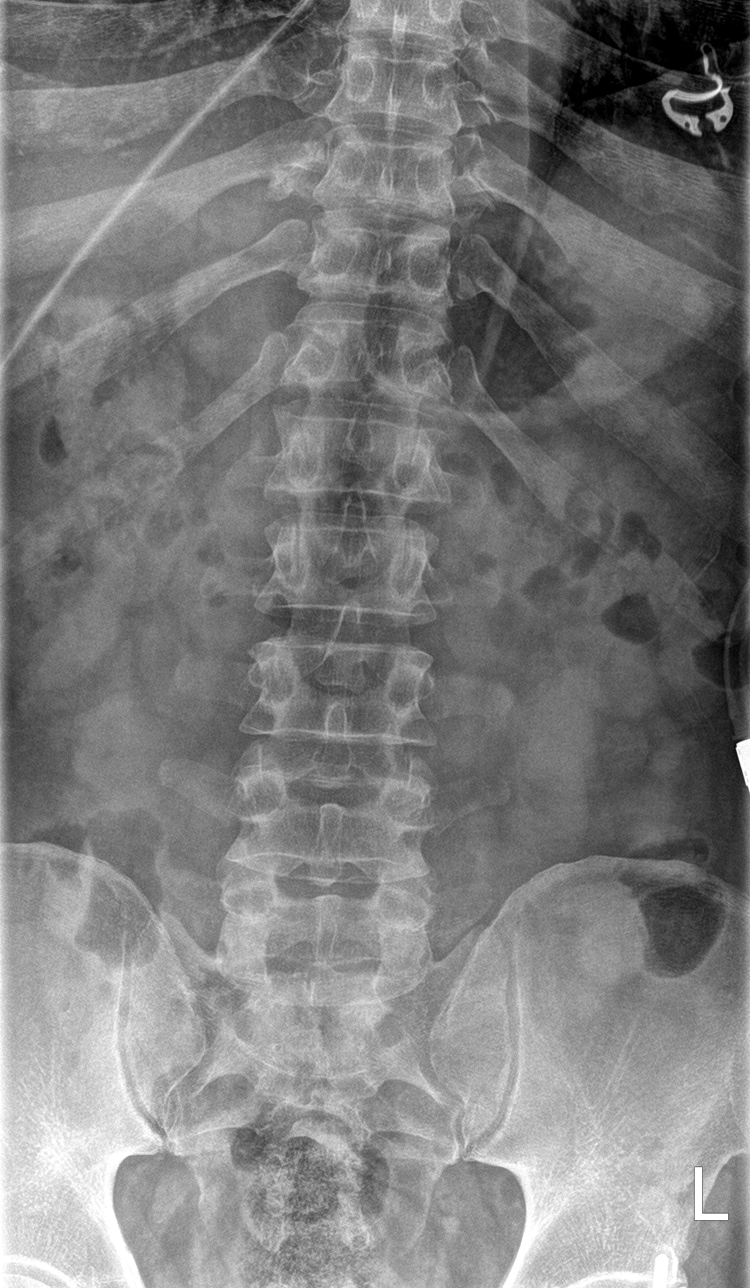

[l-spine obl (1 of 2)]
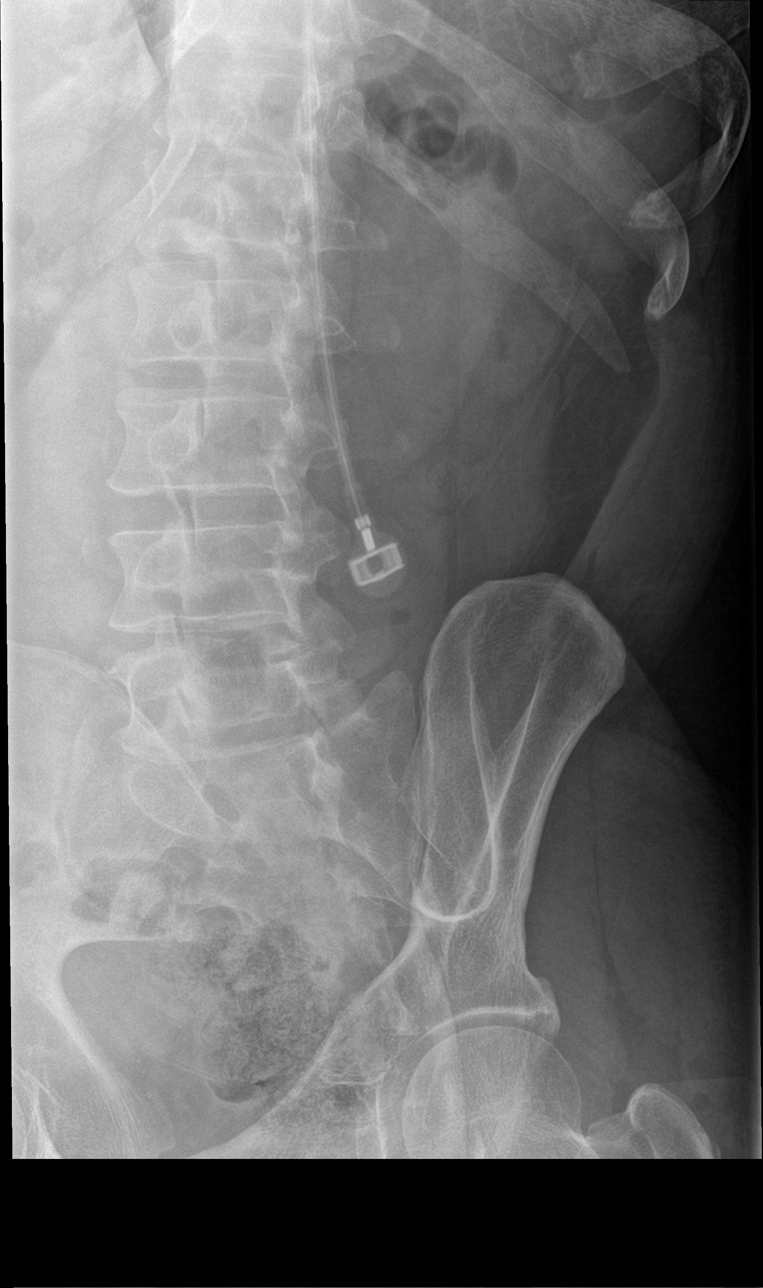

[l-spine obl (2 of 2)]
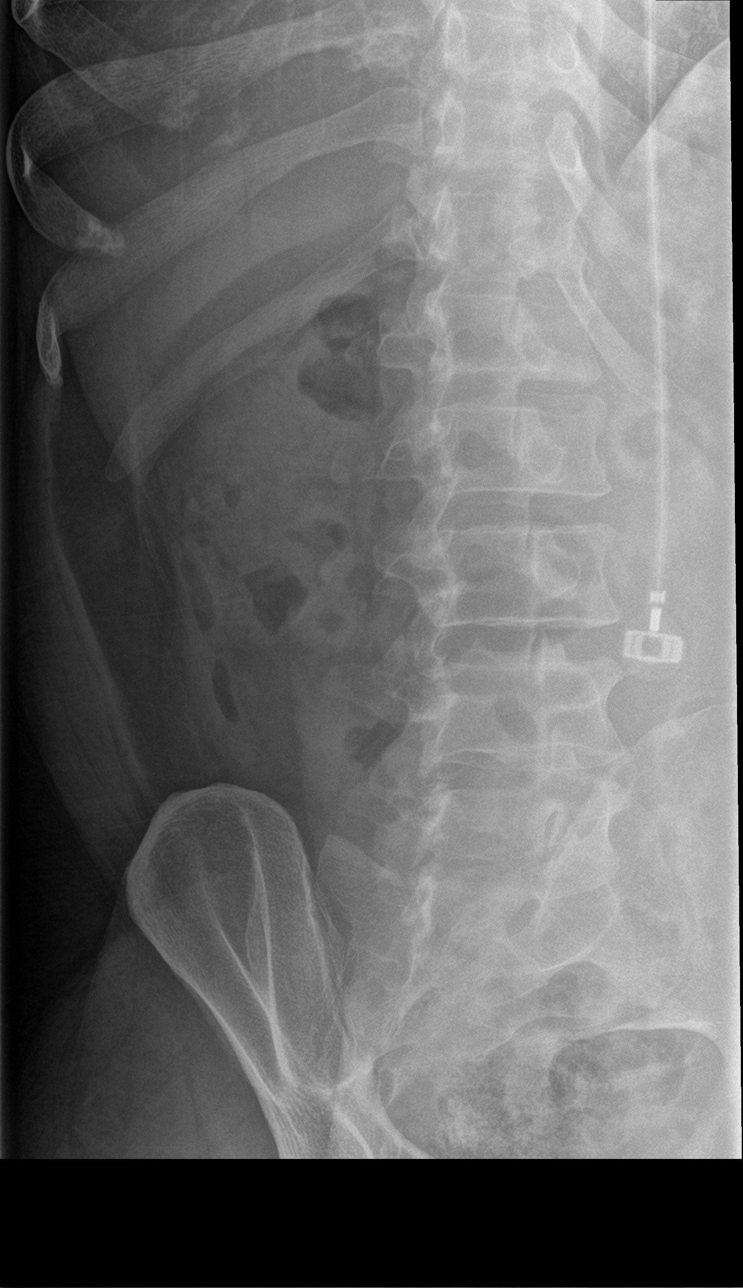

[l-spine lat]
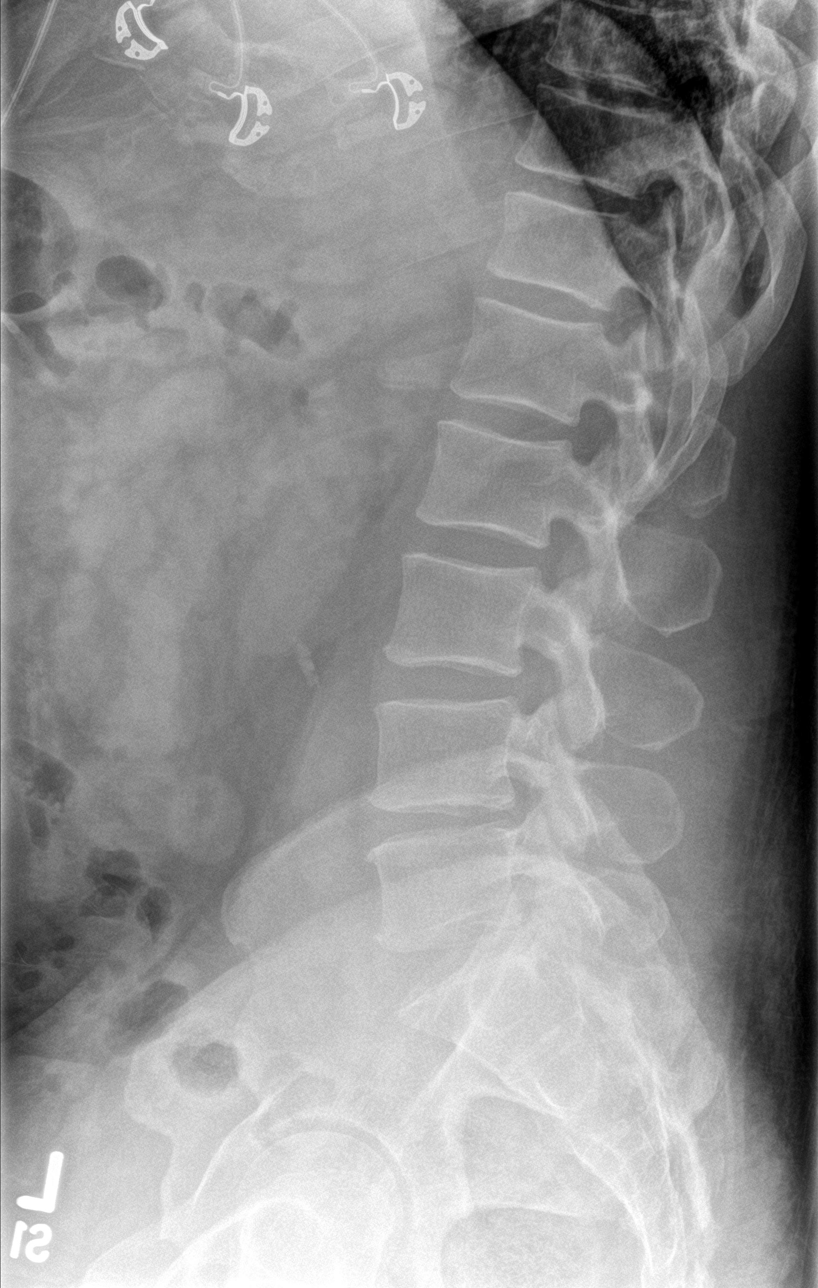

[l-spine spot]
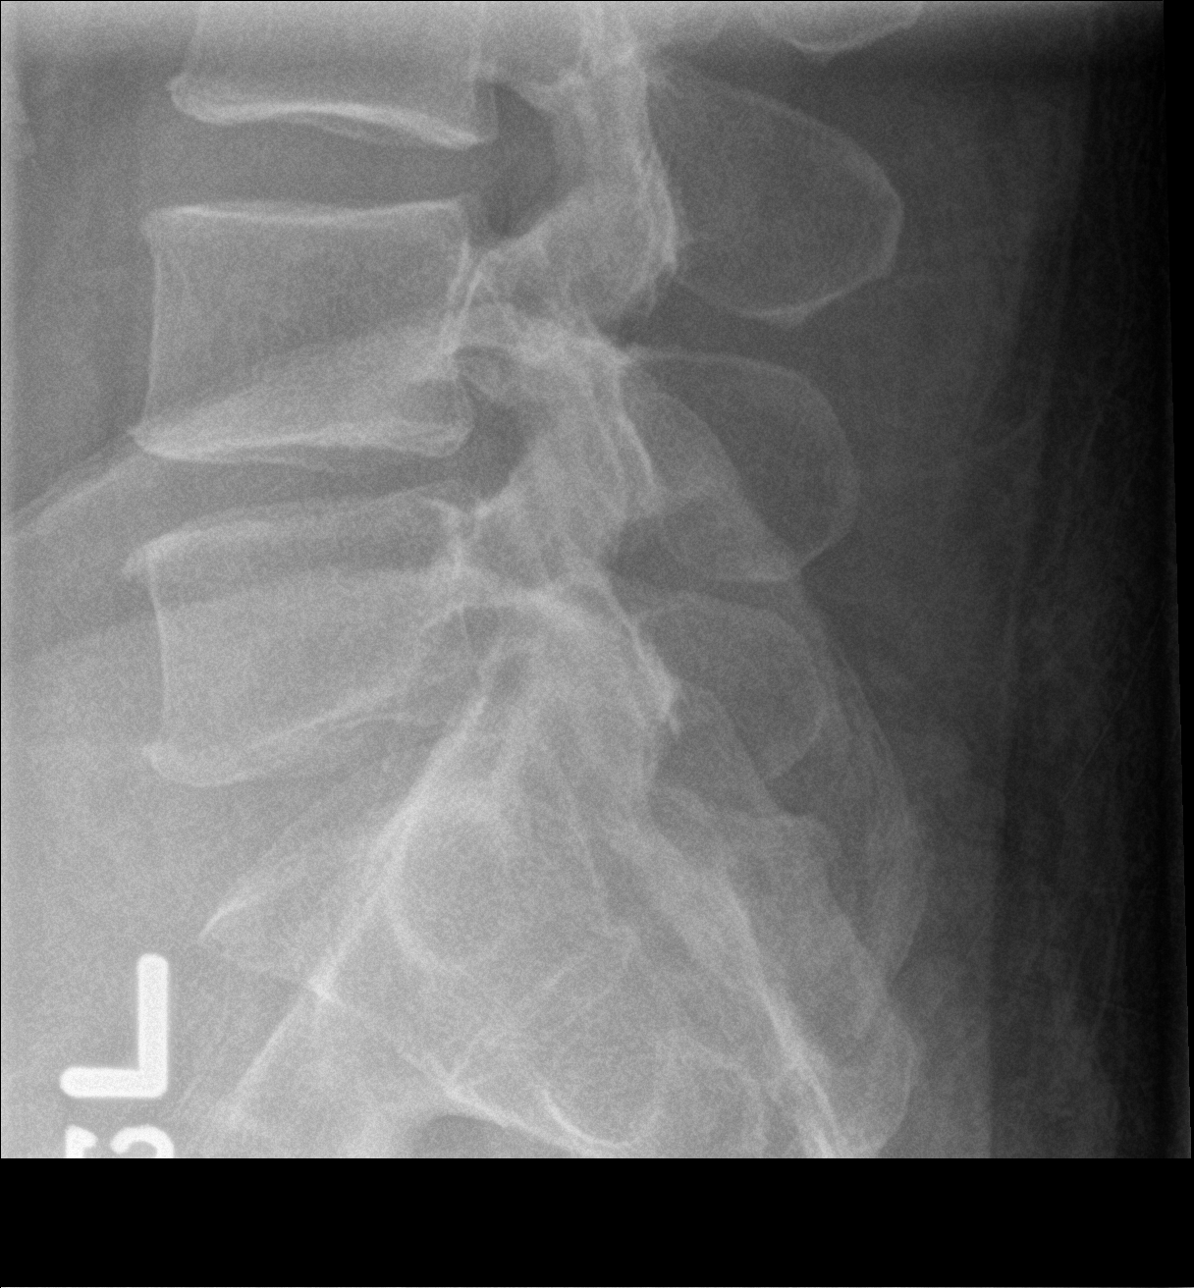

[5 of 5 positions shown; findings below may reference images not displayed]

FINDINGS: Five views of lumbar spine submitted. No acute fracture or
subluxation. Mild anterior spurring noted upper and lower endplate
of L5 vertebral body. Mild disc space flattening at L4-L5 and L5-S1
level. Alignment and vertebral body heights are preserved.
IMPRESSION: No acute fracture or subluxation. Mild degenerative changes as
described above.

## 2014-06-28 IMAGING — DX DG CHEST 2V
2 series · 2 of 2 positions shown · non-contrast
Comparison: [DATE]

CLINICAL DATA: Fell going down steps [REDACTED], back pain, chest pain

EXAM:
CHEST  2 VIEW

[chest pa]
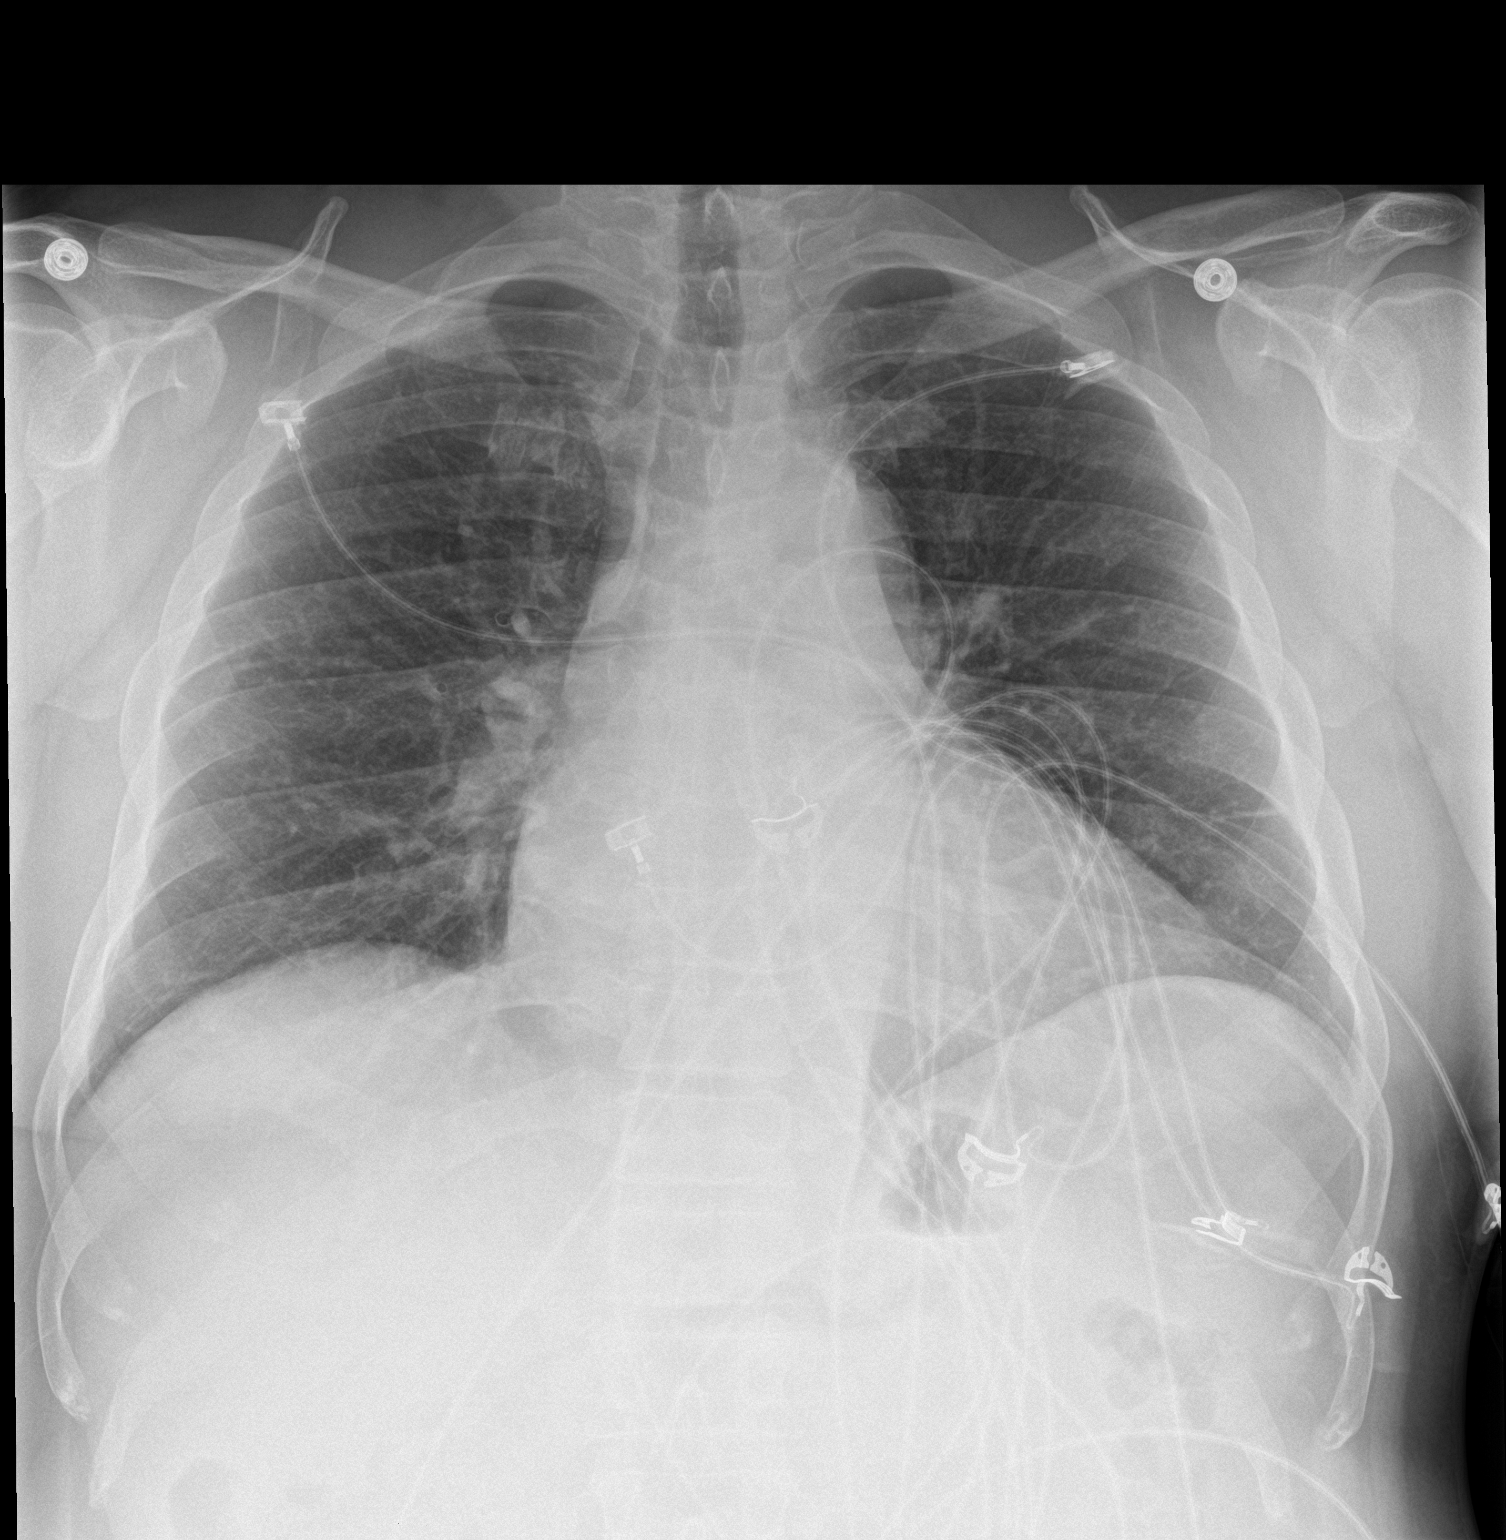

[chest lat]
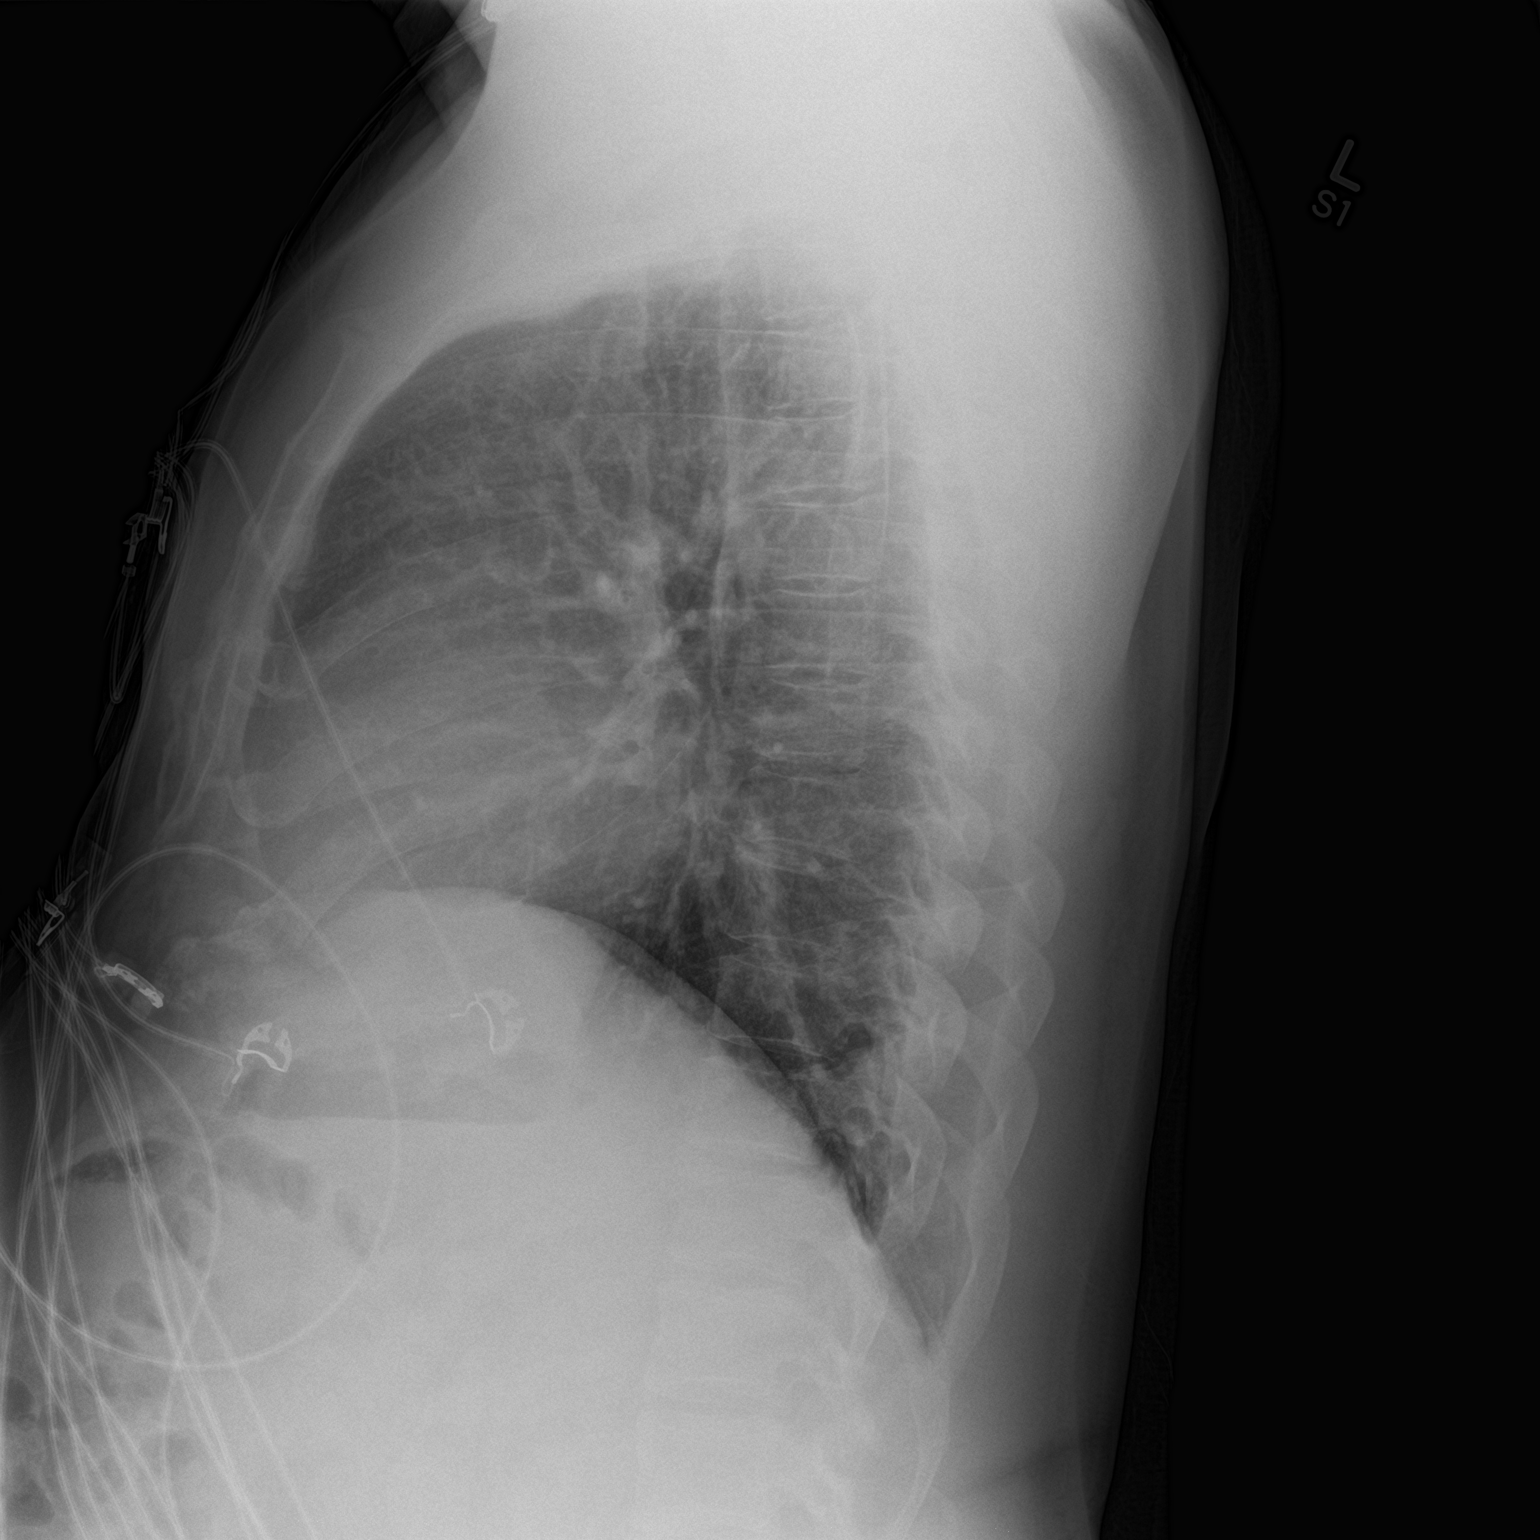

[2 of 2 positions shown; findings below may reference images not displayed]

FINDINGS: Cardiomediastinal silhouette is stable. No acute infiltrate or
pleural effusion. No pulmonary edema. Bony thorax is unremarkable.
IMPRESSION: No active cardiopulmonary disease.

## 2014-06-28 MED ORDER — HYDROMORPHONE HCL 1 MG/ML IJ SOLN
1.0000 mg | Freq: Once | INTRAMUSCULAR | Status: AC
  Administered 2014-06-28: 1 mg via INTRAMUSCULAR
  Filled 2014-06-28: qty 1

## 2014-06-28 MED ORDER — TRAMADOL HCL 50 MG PO TABS: 50.0000 mg | ORAL_TABLET | Freq: Four times a day (QID) | ORAL | Status: DC | PRN

## 2014-06-28 MED ORDER — LOSARTAN POTASSIUM 50 MG PO TABS: 50.0000 mg | ORAL_TABLET | Freq: Every day | ORAL | Status: DC

## 2014-06-28 NOTE — ED Notes (Signed)
Ice pack applied to lumbar spine.

## 2014-06-28 NOTE — ED Provider Notes (Signed)
CSN: [id number]     Arrival date & time 06/28/14  0741 History  This chart was scribed for Nat Christen, MD by Eustaquio Maize, ED Scribe. This patient was seen in room APA14/APA14 and the patient's care was started at 8:24 AM.     No chief complaint on file.  The history is provided by the patient. No language interpreter was used.     HPI Comments: Craig Knight is a 49 y.o. male with hx HTN and DM who presents to the Emergency Department complaining of lower back pain s/p ground level fall that occurred Sunday, 5/1 (2 days ago). Pt states that he slipped on his wet porch steps, causing him to fall. Denies head injury or LOC. He states he has been able to ambulate since the fall. Pt complains of hand and feet pain that began 3 weeks ago. He mentions that he has exacerbated pain to his feet while standing. Pt denies neck pain, leg pain, urinary or bowel incontinence, saddle anesthesia, or any other symptoms.   Cardiologist - Dr. Harl Bowie in [place]    Past Medical History  Diagnosis Date  . Hypertension   . Asthma   . Bronchitis    Past Surgical History  Procedure Laterality Date  . Lipoma removal     No family history on file. History  Substance Use Topics  . Smoking status: Current Every Day Smoker -- 0.25 packs/day for 25 years    Types: Cigarettes  . Smokeless tobacco: Not on file  . Alcohol Use: No     Comment: occ    Review of Systems  A complete 10 system review of systems was obtained and all systems are negative except as noted in the HPI and PMH.    Allergies  Dust mite extract  Home Medications   Prior to Admission medications   Medication Sig Start Date End Date Taking? Authorizing Provider  aspirin EC 81 MG tablet Take 1 tablet (81 mg total) by mouth daily. 10/13/13  Yes Arnoldo Lenis, MD  atorvastatin (LIPITOR) 40 MG tablet Take 1 tablet (40 mg total) by mouth daily. 10/13/13  Yes Arnoldo Lenis, MD  labetalol (NORMODYNE) 200 MG tablet Take 1 tablet (200  mg total) by mouth 2 (two) times daily. 11/12/13  Yes Arnoldo Lenis, MD  metFORMIN (GLUCOPHAGE) 500 MG tablet Take 1 tablet (500 mg total) by mouth 2 (two) times daily. Patient taking differently: Take 500 mg by mouth daily.  10/13/13  Yes Arnoldo Lenis, MD  ranitidine (ZANTAC) 150 MG tablet Take 1 tablet (150 mg total) by mouth 2 (two) times daily. 11/12/13  Yes Arnoldo Lenis, MD  amLODipine (NORVASC) 10 MG tablet Take 1 tablet (10 mg total) by mouth daily. Patient not taking: Reported on 06/28/2014 09/17/13   Verlee Monte, MD  losartan (COZAAR) 50 MG tablet Take 1 tablet (50 mg total) by mouth daily. 06/28/14   Nat Christen, MD  oxyCODONE-acetaminophen (PERCOCET) 5-325 MG per tablet Take 1-2 tablets by mouth every 4 (four) hours as needed. Patient not taking: Reported on 06/28/2014 12/08/13   Veryl Speak, MD  traMADol (ULTRAM) 50 MG tablet Take 1 tablet (50 mg total) by mouth every 6 (six) hours as needed. 06/28/14   Nat Christen, MD   Triage Vitals: BP 174/116 mmHg  Pulse 73  Temp(Src) 98.7 F (37.1 C) (Oral)  Resp 15  Ht 5\' 7"  (1.702 m)  Wt 220 lb (99.791 kg)  BMI 34.45 kg/m2  SpO2 97%  Physical Exam  Constitutional: He is oriented to person, place, and time. He appears well-developed and well-nourished.  HENT:  Head: Normocephalic and atraumatic.  Eyes: Conjunctivae and EOM are normal. Pupils are equal, round, and reactive to light.  Neck: Normal range of motion. Neck supple.  Cardiovascular: Normal rate and regular rhythm.   Pulmonary/Chest: Effort normal and breath sounds normal.  Abdominal: Soft. Bowel sounds are normal.  Musculoskeletal: Normal range of motion.  Diffusely tender to general lumbar spine.   Neurological: He is alert and oriented to person, place, and time.  Skin: Skin is warm and dry.  Psychiatric: He has a normal mood and affect. His behavior is normal.  Nursing note and vitals reviewed.   ED Course  Procedures (including critical care time)  DIAGNOSTIC  STUDIES: Oxygen Saturation is 97% on RA, normal by my interpretation.    COORDINATION OF CARE: 8:28 AM-Discussed treatment plan which includes pain medication and DG Lumbar spine with pt at bedside and pt agreed to plan. Pt's BP is elevated today. Advised pt to keep track of his levels.   Labs Review Labs Reviewed  BASIC METABOLIC PANEL - Abnormal; Notable for the following:    Glucose, Bld 162 (*)    Creatinine, Ser 1.30 (*)    All other components within normal limits  CBG MONITORING, ED - Abnormal; Notable for the following:    Glucose-Capillary 191 (*)    All other components within normal limits  CBC WITH DIFFERENTIAL/PLATELET  TROPONIN I    Imaging Review Dg Chest 2 View  06/28/2014   CLINICAL DATA:  [place] going down steps Sunday, back pain, chest pain  EXAM: CHEST  2 VIEW  COMPARISON:  7/22/ 15  FINDINGS: Cardiomediastinal silhouette is stable. No acute infiltrate or pleural effusion. No pulmonary edema. Bony thorax is unremarkable.  IMPRESSION: No active cardiopulmonary disease.   Electronically Signed   By: Lahoma Crocker M.D.   On: 06/28/2014 09:48   Dg Lumbar Spine Complete  06/28/2014   CLINICAL DATA:  [place] Sunday going down steps, landed on lower back, lateral pain  EXAM: LUMBAR SPINE - COMPLETE 4+ VIEW  COMPARISON:  09/15/2013  FINDINGS: Five views of lumbar spine submitted. No acute fracture or subluxation. Mild anterior spurring noted upper and lower endplate of L5 vertebral body. Mild disc space flattening at L4-L5 and L5-S1 level. Alignment and vertebral body heights are preserved.  IMPRESSION: No acute fracture or subluxation. Mild degenerative changes as described above.   Electronically Signed   By: Lahoma Crocker M.D.   On: 06/28/2014 09:47     EKG Interpretation   Date/Time:  Tuesday Jun 28 2014 08:08:59 EDT Ventricular Rate:  71 PR Interval:  190 QRS Duration: 83 QT Interval:  382 QTC Calculation: 415 R Axis:   -40 Text Interpretation:  Sinus rhythm Probable left  atrial enlargement Left  anterior fascicular block Left ventricular hypertrophy Anterior infarct,  old Confirmed by Lafe Clerk  MD, [place] ([id number]) on 06/28/2014 8:57:57 AM      MDM   Final diagnoses:  Lumbar pain  Fall, initial encounter  Essential hypertension   Plain films of chest and lumbar spine show no fracture. Glucose elevated at 162. Creatinine 1.3. These findings were discussed with patient and his wife. Blood pressure under poor control. Increase losartan to 50 mg daily. Patient will follow-up with cardiologist.   I personally performed the services described in this documentation, which was scribed in my presence. The recorded information has been reviewed and is  accurate.      Nat Christen, MD 06/28/14 1409

## 2014-06-28 NOTE — ED Notes (Signed)
MD at bedside. 

## 2014-06-28 NOTE — Discharge Instructions (Signed)
X-ray show no fracture. In regards to your blood pressure, increase losartan to 50 mg daily. New prescription given. Follow-up with Dr. Harl Bowie in [place]. Will need to call for appointment.  Your glucose today was 162. Creatinine which is a measure of kidney function was 1.3

## 2014-06-28 NOTE — ED Notes (Signed)
Pt reports pain in hands and feet for the past 2 weeks.  Reports slipped on a wet porch 2 days ago and fell on back.  Pt c/o pain in hands and lower back.  Pt also reports chest pain x 3 weeks off and on.

## 2014-06-28 NOTE — ED Notes (Signed)
Patient with no complaints at this time. Respirations even and unlabored. Skin warm/dry. Discharge instructions reviewed with patient at this time. Patient given opportunity to voice concerns/ask questions. Provided list of local providers for PCP.  Patient discharged at this time and left Emergency Department with steady gait.

## 2014-06-28 NOTE — ED Notes (Signed)
Notified Dr. Lacinda Axon of continued elevated BPs. Declines treating at present.  Patient provided meal tray.

## 2014-09-28 NOTE — Telephone Encounter (Signed)
Craig Knight has questions.

## 2015-03-29 ENCOUNTER — Emergency Department (HOSPITAL_COMMUNITY)
Admission: EM | Admit: 2015-03-29 | Discharge: 2015-03-29 | Disposition: A | Discharge status: Home / Self Care | Payer: BLUE CROSS/BLUE SHIELD | Attending: Emergency Medicine | Admitting: Emergency Medicine

## 2015-03-29 ENCOUNTER — Encounter (HOSPITAL_COMMUNITY): Payer: Self-pay | Admitting: Emergency Medicine

## 2015-03-29 ENCOUNTER — Emergency Department (HOSPITAL_COMMUNITY): Payer: BLUE CROSS/BLUE SHIELD

## 2015-03-29 DIAGNOSIS — J45909 Unspecified asthma, uncomplicated: Secondary | ICD-10-CM | POA: Insufficient documentation

## 2015-03-29 DIAGNOSIS — M791 Myalgia: Secondary | ICD-10-CM | POA: Insufficient documentation

## 2015-03-29 DIAGNOSIS — J0111 Acute recurrent frontal sinusitis: Secondary | ICD-10-CM | POA: Diagnosis not present

## 2015-03-29 DIAGNOSIS — I10 Essential (primary) hypertension: Secondary | ICD-10-CM | POA: Insufficient documentation

## 2015-03-29 DIAGNOSIS — Z79899 Other long term (current) drug therapy: Secondary | ICD-10-CM | POA: Insufficient documentation

## 2015-03-29 DIAGNOSIS — R0981 Nasal congestion: Secondary | ICD-10-CM | POA: Diagnosis present

## 2015-03-29 DIAGNOSIS — M25512 Pain in left shoulder: Secondary | ICD-10-CM | POA: Diagnosis not present

## 2015-03-29 DIAGNOSIS — M546 Pain in thoracic spine: Secondary | ICD-10-CM | POA: Insufficient documentation

## 2015-03-29 DIAGNOSIS — F17210 Nicotine dependence, cigarettes, uncomplicated: Secondary | ICD-10-CM | POA: Diagnosis not present

## 2015-03-29 DIAGNOSIS — Z7982 Long term (current) use of aspirin: Secondary | ICD-10-CM | POA: Insufficient documentation

## 2015-03-29 IMAGING — DX DG SHOULDER 2+V*L*
3 series · 3 of 3 positions shown · non-contrast
Comparison: None.

CLINICAL DATA: Left side neck pain radiates into neck and shoulder.

EXAM:
LEFT SHOULDER - 2+ VIEW

[shoulder ap]
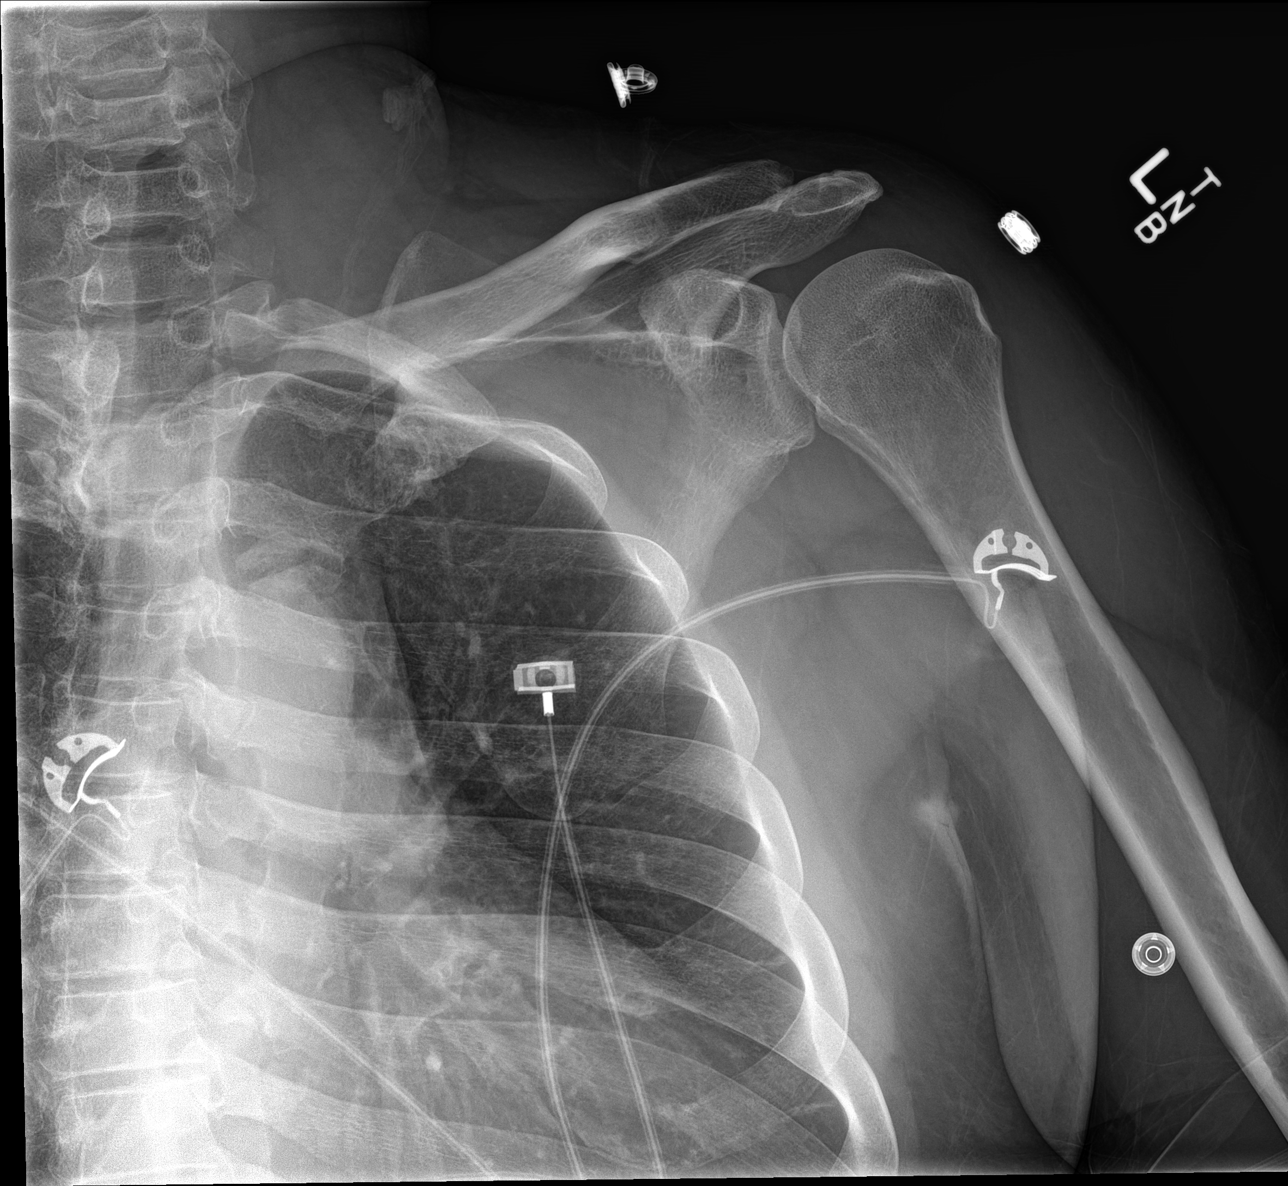

[shoulder y view]
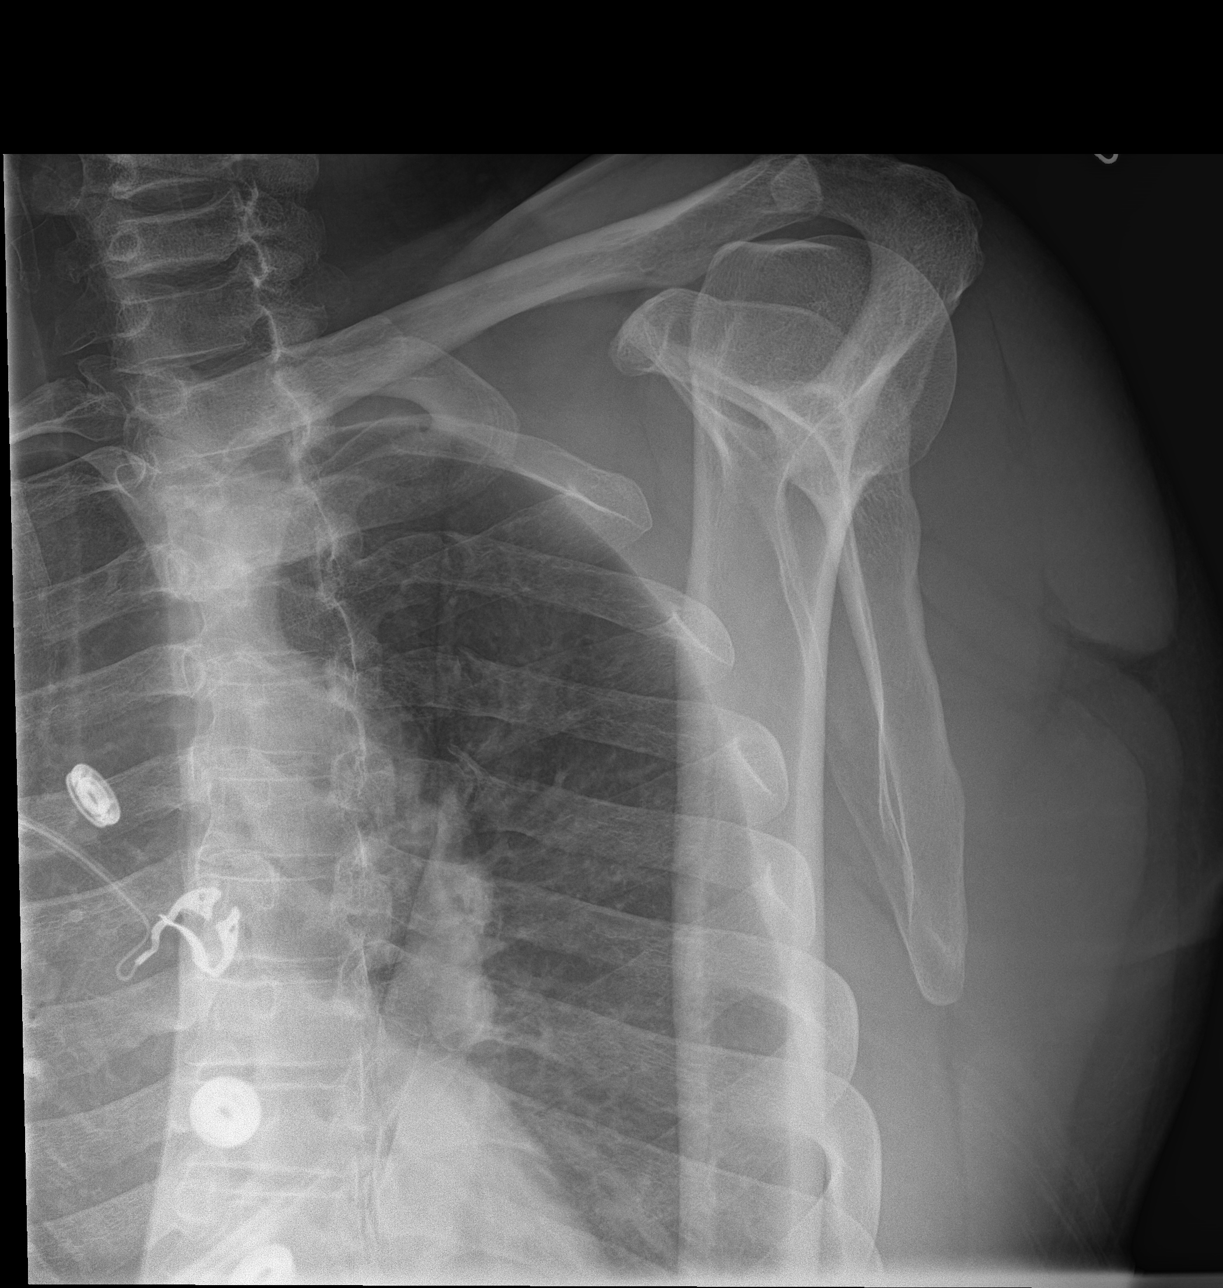

[shoulder axillary]
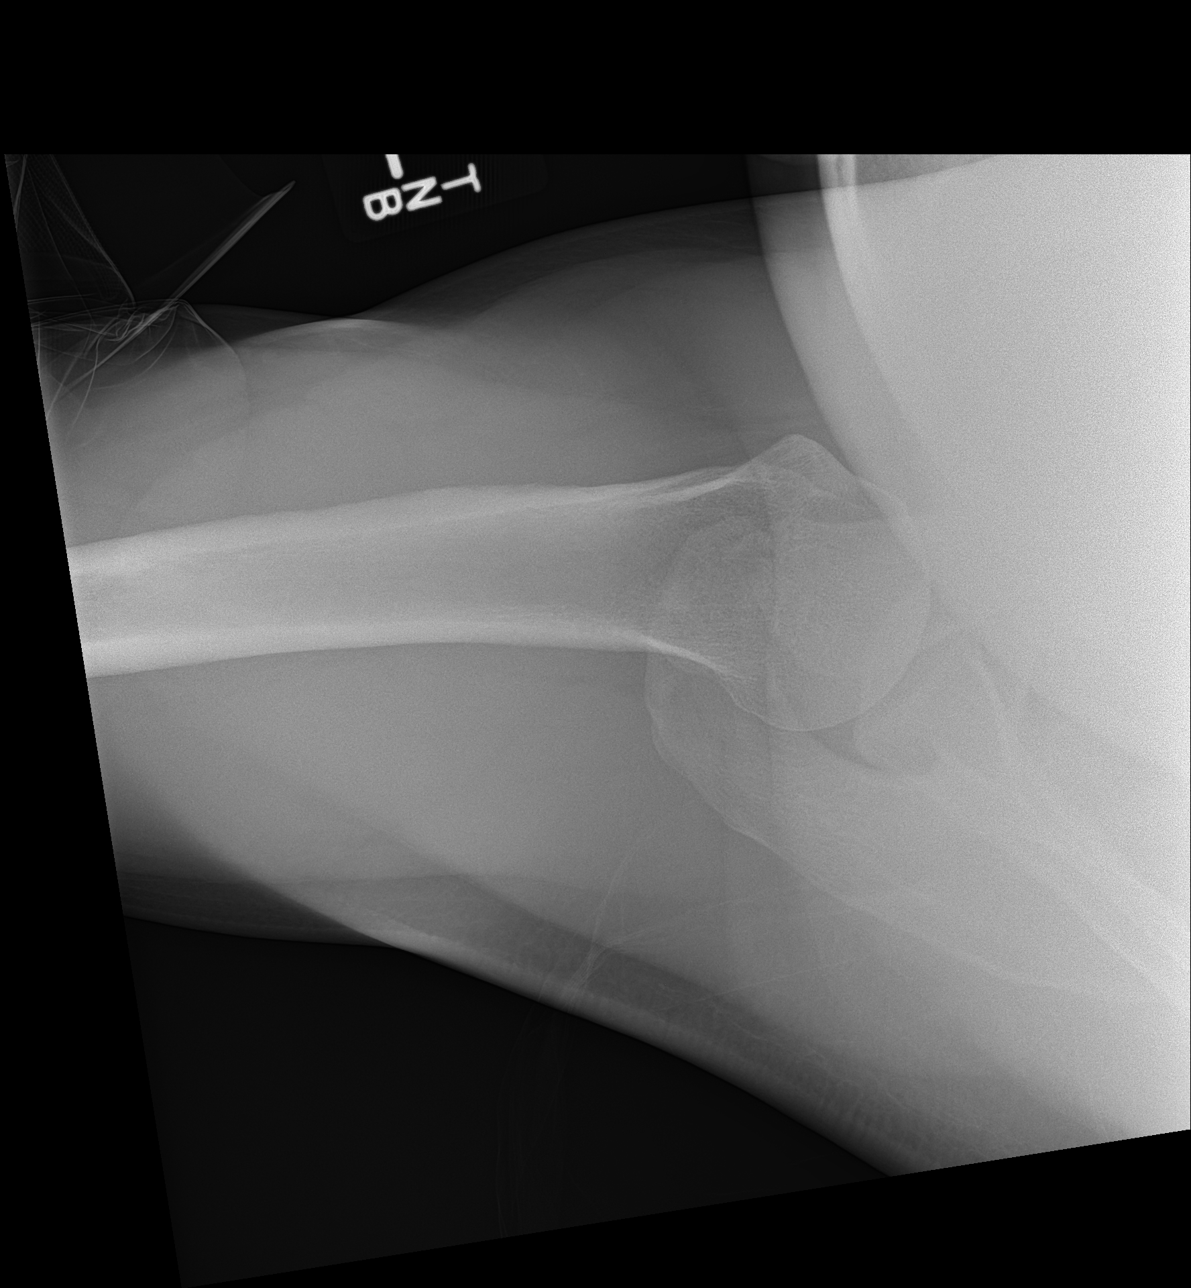

[3 of 3 positions shown; findings below may reference images not displayed]

FINDINGS: There is no evidence of fracture or dislocation. There is no
evidence of arthropathy or other focal bone abnormality. Soft
tissues are unremarkable.
IMPRESSION: Negative.

## 2015-03-29 MED ORDER — AMOXICILLIN 500 MG PO CAPS: 500.0000 mg | ORAL_CAPSULE | Freq: Three times a day (TID) | ORAL | Status: DC

## 2015-03-29 MED ORDER — HYDROCODONE-ACETAMINOPHEN 5-325 MG PO TABS: 1.0000 | ORAL_TABLET | Freq: Four times a day (QID) | ORAL | Status: DC | PRN

## 2015-03-29 MED ORDER — OXYCODONE-ACETAMINOPHEN 5-325 MG PO TABS
1.0000 | ORAL_TABLET | Freq: Once | ORAL | Status: AC
  Administered 2015-03-29: 1 via ORAL
  Filled 2015-03-29: qty 1

## 2015-03-29 NOTE — ED Provider Notes (Addendum)
CSN: [id number]     Arrival date & time 03/29/15  1005 History  By signing my name below, I, Emmanuella Mensah, attest that this documentation has been prepared under the direction and in the presence of Milton Ferguson, MD. Electronically Signed: Judithann Sauger, ED Scribe. 03/29/2015. 12:01 PM.    Chief Complaint  Patient presents with  . Nasal Congestion   Patient is a 50 y.o. male presenting with URI. The history is provided by the patient (Patient complains of sinus congestion and earache). No language interpreter was used.  URI Presenting symptoms: congestion and ear pain   Presenting symptoms: no cough and no fatigue   Severity:  Moderate Onset quality:  Gradual Duration:  4 days Timing:  Constant Progression:  Worsening Chronicity:  New Relieved by:  Nothing Worsened by:  Nothing tried Ineffective treatments:  OTC medications Associated symptoms: arthralgias (left shoulder) and myalgias   Associated symptoms: no headaches    HPI Comments: Craig Knight is a 50 y.o. male with a hx of HTN who presents to the Emergency Department complaining of gradually worsening nasal congestion onset several days ago. He reports associated ringing in left ear, chills, generalized body aches. He reports that he has been taking OTC sinus medication with no relief and he denies taking his HTN medication because he has been taking other medications. No fever, ear discharge, or n/v.   He also c/o gradually worsening constant left shoulder pain that radiates down his left arm onset several days ago. He reports that he has tried muscle rubs on his shoulder with no relief. He states that he works in [employer]. No lacerations or rash.   No PCP   Past Medical History  Diagnosis Date  . Hypertension   . Asthma   . Bronchitis    Past Surgical History  Procedure Laterality Date  . Lipoma removal     No family history on file. Social History  Substance Use Topics  . Smoking status: Current  Every Day Smoker -- 0.50 packs/day for 25 years    Types: Cigarettes  . Smokeless tobacco: None  . Alcohol Use: Yes     Comment: occ    Review of Systems  Constitutional: Positive for chills. Negative for appetite change and fatigue.  HENT: Positive for congestion and ear pain. Negative for ear discharge and sinus pressure.   Eyes: Negative for discharge.  Respiratory: Negative for cough.   Cardiovascular: Negative for chest pain.  Gastrointestinal: Negative for nausea, vomiting, abdominal pain and diarrhea.  Genitourinary: Negative for frequency and hematuria.  Musculoskeletal: Positive for myalgias and arthralgias (left shoulder). Negative for back pain.  Skin: Negative for rash.  Neurological: Negative for seizures and headaches.  Psychiatric/Behavioral: Negative for hallucinations.      Allergies  Dust mite extract  Home Medications   Prior to Admission medications   Medication Sig Start Date End Date Taking? Authorizing Provider  aspirin EC 81 MG tablet Take 1 tablet (81 mg total) by mouth daily. 10/13/13  Yes Arnoldo Lenis, MD  Aspirin-Salicylamide-Caffeine [facility] HEADACHE) [phone] MG TABS Take 1 packet by mouth daily as needed (for pain).   Yes Historical Provider, MD  loratadine (CLARITIN) 10 MG tablet Take 10 mg by mouth daily.   Yes Historical Provider, MD  pseudoephedrine (SUDAFED) 30 MG tablet Take 30 mg by mouth every 4 (four) hours as needed for congestion.   Yes Historical Provider, MD   BP 199/120 mmHg  Pulse 77  Temp(Src) 97.6 F (36.4 C) (  Oral)  Resp 14  Ht 5\' 8"  (1.727 m)  Wt 215 lb (97.523 kg)  BMI 32.70 kg/m2  SpO2 98% Physical Exam  Constitutional: He is oriented to person, place, and time. He appears well-developed.  HENT:  Head: Normocephalic.  Left TMs inflamed [place] over maxillary sinuses  Eyes: Conjunctivae and EOM are normal. No scleral icterus.  Neck: Neck supple. No tracheal deviation present. No thyromegaly present.   Cardiovascular: Normal rate and regular rhythm.  Exam reveals no gallop and no friction rub.   No murmur heard. Pulmonary/Chest: No stridor. He has no wheezes. He has no rales. He exhibits no tenderness.  Abdominal: He exhibits no distension. There is no tenderness. There is no rebound.  Musculoskeletal: Normal range of motion. He exhibits no edema.  Tenderness to left shoulder and left upper back  Lymphadenopathy:    He has no cervical adenopathy.  Neurological: He is oriented to person, place, and time. He exhibits normal muscle tone. Coordination normal.  Skin: Skin is warm. No rash noted. No erythema.  Psychiatric: He has a normal mood and affect. His behavior is normal.    ED Course  Procedures (including critical care time) DIAGNOSTIC STUDIES: Oxygen Saturation is 98% on RA, normal by my interpretation.    COORDINATION OF CARE: 11:51 AM- Pt advised of plan for treatment and pt agrees. Pt will be placed on antibiotics and also receive shoulder x-ray for further evaluation. Will provide resources for PCP follow up and advised to be compliant with HTN medications.     Labs Review Labs Reviewed - No data to display  Imaging Review No results found.   Milton Ferguson, MD has personally reviewed and evaluated these images and lab results as part of his medical decision-making.   EKG Interpretation None      MDM   Final diagnoses:  None   Sinusitis with left otitis. Also hypertension. Patient put on amoxicillin and Vicodin for the sinusitis. He was told to take his blood pressure medicine when she has not been taking them follow-up for recheck   The chart was scribed for me under my direct supervision.  I personally performed the history, physical, and medical decision making and all procedures in the evaluation of this patient.Milton Ferguson, MD 03/29/15 1306  Milton Ferguson, MD 03/29/15 [phone]

## 2015-03-29 NOTE — ED Notes (Signed)
Instructed pt to take all of antibiotics as prescribed.Pt verbalized understanding of no driving and to use caution within 4 hours of taking pain meds due to meds cause drowsiness 

## 2015-03-29 NOTE — ED Notes (Signed)
Pt denies any CP, only left shoulder pain that has persisted the duration of all other symptoms.

## 2015-03-29 NOTE — Discharge Instructions (Signed)
Follow up with [facility] in [place] for bp check

## 2015-03-29 NOTE — ED Notes (Addendum)
Has been taking OTC claritin and sinus med for congestion, pt states not taking his HTN med due to sinus medication is taking

## 2015-03-29 NOTE — ED Notes (Addendum)
Pt here with multiple complaints. Pt c/o nasal congestion, cough, otalgia in bilateral ears, tinnitus in LT ear, and LT shoulder pain x 2 weeks. Pt reports taking OTC medications with no relief. Pt hypertensive 188/133. States he has not taken BP medication today. Denies CP.

## 2015-04-02 ENCOUNTER — Emergency Department (HOSPITAL_COMMUNITY)
Admission: EM | Admit: 2015-04-02 | Discharge: 2015-04-02 | Disposition: A | Discharge status: Home / Self Care | Payer: BLUE CROSS/BLUE SHIELD | Attending: Emergency Medicine | Admitting: Emergency Medicine

## 2015-04-02 ENCOUNTER — Encounter (HOSPITAL_COMMUNITY): Payer: Self-pay | Admitting: *Deleted

## 2015-04-02 DIAGNOSIS — I10 Essential (primary) hypertension: Secondary | ICD-10-CM | POA: Insufficient documentation

## 2015-04-02 DIAGNOSIS — R05 Cough: Secondary | ICD-10-CM | POA: Diagnosis not present

## 2015-04-02 DIAGNOSIS — F17210 Nicotine dependence, cigarettes, uncomplicated: Secondary | ICD-10-CM | POA: Insufficient documentation

## 2015-04-02 DIAGNOSIS — M25519 Pain in unspecified shoulder: Secondary | ICD-10-CM | POA: Diagnosis not present

## 2015-04-02 DIAGNOSIS — J45909 Unspecified asthma, uncomplicated: Secondary | ICD-10-CM | POA: Insufficient documentation

## 2015-04-02 DIAGNOSIS — Z7982 Long term (current) use of aspirin: Secondary | ICD-10-CM | POA: Insufficient documentation

## 2015-04-02 DIAGNOSIS — Z792 Long term (current) use of antibiotics: Secondary | ICD-10-CM | POA: Diagnosis not present

## 2015-04-02 DIAGNOSIS — Z79899 Other long term (current) drug therapy: Secondary | ICD-10-CM | POA: Insufficient documentation

## 2015-04-02 DIAGNOSIS — H6692 Otitis media, unspecified, left ear: Secondary | ICD-10-CM | POA: Diagnosis not present

## 2015-04-02 DIAGNOSIS — R0981 Nasal congestion: Secondary | ICD-10-CM | POA: Diagnosis present

## 2015-04-02 MED ORDER — OXYCODONE-ACETAMINOPHEN 5-325 MG PO TABS: 2.0000 | ORAL_TABLET | ORAL | Status: DC | PRN

## 2015-04-02 MED ORDER — CEFDINIR 300 MG PO CAPS
300.0000 mg | ORAL_CAPSULE | Freq: Two times a day (BID) | ORAL | Status: DC
Start: 2015-04-02 — End: 2015-06-07

## 2015-04-02 MED ORDER — METHOCARBAMOL 500 MG PO TABS: 500.0000 mg | ORAL_TABLET | Freq: Two times a day (BID) | ORAL | Status: DC

## 2015-04-02 MED ORDER — LOSARTAN POTASSIUM-HCTZ 100-25 MG PO TABS: 1.0000 | ORAL_TABLET | Freq: Every day | ORAL | Status: DC

## 2015-04-02 NOTE — ED Notes (Signed)
PT stated he has not been taking his b/p medication. PT warned about possible effects of high b/p readings and told to see primary MD for b/p management.

## 2015-04-02 NOTE — ED Notes (Signed)
Pt comes here with nasal congestion, cough, and earache that is primarily in the left ear but has occurred in the right ear. Pt also has left shoulder pain that has persisted since the snow the beginning of January.

## 2015-04-02 NOTE — Discharge Instructions (Signed)
Otitis Media, Adult °Otitis media is redness, soreness, and puffiness (swelling) in the space just behind your eardrum (middle ear). It may be caused by allergies or infection. It often happens along with a cold. °HOME CARE °· Take your medicine as told. Finish it even if you start to feel better. °· Only take over-the-counter or prescription medicines for pain, discomfort, or fever as told by your doctor. °· Follow up with your doctor as told. °GET HELP IF: °· You have otitis media only in one ear, or bleeding from your nose, or both. °· You notice a lump on your neck. °· You are not getting better in 3-5 days. °· You feel worse instead of better. °GET HELP RIGHT AWAY IF:  °· You have pain that is not helped with medicine. °· You have puffiness, redness, or pain around your ear. °· You get a stiff neck. °· You cannot move part of your face (paralysis). °· You notice that the bone behind your ear hurts when you touch it. °MAKE SURE YOU:  °· Understand these instructions. °· Will watch your condition. °· Will get help right away if you are not doing well or get worse. °  °This information is not intended to replace advice given to you by your health care provider. Make sure you discuss any questions you have with your health care provider. °  °Document Released: 07/31/2007 Document Revised: 03/04/2014 Document Reviewed: 09/08/2012 °Elsevier Interactive Patient Education ©2016 [employer] °Hypertension °Hypertension, commonly called high blood pressure, is when the force of blood pumping through your arteries is too strong. Your arteries are the blood vessels that carry blood from your heart throughout your body. A blood pressure reading consists of a higher number over a lower number, such as 110/72. The higher number (systolic) is the pressure inside your arteries when your heart pumps. The lower number (diastolic) is the pressure inside your arteries when your heart relaxes. Ideally you want your blood pressure  below 120/80. °Hypertension forces your heart to work harder to pump blood. Your arteries may become narrow or stiff. Having untreated or uncontrolled hypertension can cause heart attack, stroke, kidney disease, and other problems. °RISK FACTORS °Some risk factors for high blood pressure are controllable. Others are not.  °Risk factors you cannot control include:  °· Race. You may be at higher risk if you are African American. °· Age. Risk increases with age. °· Gender. Men are at higher risk than women before age 45 years. After age 65, women are at higher risk than men. °Risk factors you can control include: °· Not getting enough exercise or physical activity. °· Being overweight. °· Getting too much fat, sugar, calories, or salt in your diet. °· Drinking too much alcohol. °SIGNS AND SYMPTOMS °Hypertension does not usually cause signs or symptoms. Extremely high blood pressure (hypertensive crisis) may cause headache, anxiety, shortness of breath, and nosebleed. °DIAGNOSIS °To check if you have hypertension, your health care provider will measure your blood pressure while you are seated, with your arm held at the level of your heart. It should be measured at least twice using the same arm. Certain conditions can cause a difference in blood pressure between your right and left arms. A blood pressure reading that is higher than normal on one occasion does not mean that you need treatment. If it is not clear whether you have high blood pressure, you may be asked to return on a different day to have your blood pressure checked again. Or,   you may be asked to monitor your blood pressure at home for 1 or more weeks. °TREATMENT °Treating high blood pressure includes making lifestyle changes and possibly taking medicine. Living a healthy lifestyle can help lower high blood pressure. You may need to change some of your habits. °Lifestyle changes may include: °· Following the DASH diet. This diet is high in fruits,  vegetables, and whole grains. It is low in salt, red meat, and added sugars. °· Keep your sodium intake below 2,300 mg per day. °· Getting at least 30-45 minutes of aerobic exercise at least 4 times per week. °· Losing weight if necessary. °· Not smoking. °· Limiting alcoholic beverages. °· Learning ways to reduce stress. °Your health care provider may prescribe medicine if lifestyle changes are not enough to get your blood pressure under control, and if one of the following is true: °· You are 18-59 years of age and your systolic blood pressure is above 140. °· You are 60 years of age or older, and your systolic blood pressure is above 150. °· Your diastolic blood pressure is above 90. °· You have diabetes, and your systolic blood pressure is over 140 or your diastolic blood pressure is over 90. °· You have kidney disease and your blood pressure is above 140/90. °· You have heart disease and your blood pressure is above 140/90. °Your personal target blood pressure may vary depending on your medical conditions, your age, and other factors. °HOME CARE INSTRUCTIONS °· Have your blood pressure rechecked as directed by your health care provider.   °· Take medicines only as directed by your health care provider. Follow the directions carefully. Blood pressure medicines must be taken as prescribed. The medicine does not work as well when you skip doses. Skipping doses also puts you at risk for problems. °· Do not smoke.   °· Monitor your blood pressure at home as directed by your health care provider.  °SEEK MEDICAL CARE IF:  °· You think you are having a reaction to medicines taken. °· You have recurrent headaches or feel dizzy. °· You have swelling in your ankles. °· You have trouble with your vision. °SEEK IMMEDIATE MEDICAL CARE IF: °· You develop a severe headache or confusion. °· You have unusual weakness, numbness, or feel faint. °· You have severe chest or abdominal pain. °· You vomit repeatedly. °· You have  trouble breathing. °MAKE SURE YOU:  °· Understand these instructions. °· Will watch your condition. °· Will get help right away if you are not doing well or get worse. °  °This information is not intended to replace advice given to you by your health care provider. Make sure you discuss any questions you have with your health care provider. °  °Document Released: 02/11/2005 Document Revised: 06/28/2014 Document Reviewed: 12/04/2012 °Elsevier Interactive Patient Education ©2016 Elsevier Inc. ° °

## 2015-04-02 NOTE — ED Provider Notes (Signed)
CSN: [id number]     Arrival date & time 04/02/15  [id number] History   First MD Initiated Contact with Patient 04/02/15 [id number]     Chief Complaint  Patient presents with  . Nasal Congestion     (Consider location/radiation/quality/duration/timing/severity/associated sxs/prior Treatment) Patient is a 50 y.o. male presenting with ear pain. The history is provided by the patient. No language interpreter was used.  Otalgia Location:  Left Behind ear:  Redness and swelling Quality:  Aching Severity:  Moderate Duration:  5 days Timing:  Constant Progression:  Worsening Chronicity:  New Relieved by:  Nothing Worsened by:  Nothing tried Associated symptoms: congestion and cough   Pt complains of continued ear pain and continued shoulder pain.  Pt ws here 4 days ago. Pt thinks he may have strained shoulder at work  Past Medical History  Diagnosis Date  . Hypertension   . Asthma   . Bronchitis    Past Surgical History  Procedure Laterality Date  . Lipoma removal     No family history on file. Social History  Substance Use Topics  . Smoking status: Current Every Day Smoker -- 0.50 packs/day for 25 years    Types: Cigarettes  . Smokeless tobacco: None  . Alcohol Use: Yes     Comment: occ    Review of Systems  HENT: Positive for congestion and ear pain.   Respiratory: Positive for cough.   All other systems reviewed and are negative.     Allergies  Dust mite extract  Home Medications   Prior to Admission medications   Medication Sig Start Date End Date Taking? Authorizing Provider  amoxicillin (AMOXIL) 500 MG capsule Take 1 capsule (500 mg total) by mouth 3 (three) times daily. 03/29/15   Milton Ferguson, MD  aspirin EC 81 MG tablet Take 1 tablet (81 mg total) by mouth daily. 10/13/13   Arnoldo Lenis, MD  Aspirin-Salicylamide-Caffeine [facility] HEADACHE) [phone] MG TABS Take 1 packet by mouth daily as needed (for pain).    Historical Provider, MD  HYDROcodone-acetaminophen  (NORCO/VICODIN) 5-325 MG tablet Take 1 tablet by mouth every 6 (six) hours as needed. 03/29/15   Milton Ferguson, MD  loratadine (CLARITIN) 10 MG tablet Take 10 mg by mouth daily.    Historical Provider, MD  pseudoephedrine (SUDAFED) 30 MG tablet Take 30 mg by mouth every 4 (four) hours as needed for congestion.    Historical Provider, MD   BP 181/112 mmHg  Pulse 78  Temp(Src) 98.1 F (36.7 C) (Oral)  Resp 16  Ht 5\' 8"  (1.727 m)  Wt 97.523 kg  BMI 32.70 kg/m2  SpO2 98% Physical Exam  Constitutional: He is oriented to person, place, and time. He appears well-developed and well-nourished.  HENT:  Head: Normocephalic and atraumatic.  Right Ear: External ear normal.  Left Ear: External ear normal.  Nose: Nose normal.  Mouth/Throat: Oropharynx is clear and moist.  Eyes: Conjunctivae and EOM are normal. Pupils are equal, round, and reactive to light.  Neck: Normal range of motion.  Cardiovascular: Normal rate and normal heart sounds.   Pulmonary/Chest: Effort normal.  Abdominal: Soft. He exhibits no distension.  Musculoskeletal: Normal range of motion.  Neurological: He is alert and oriented to person, place, and time.  Skin: Skin is warm.  Psychiatric: He has a normal mood and affect.  Nursing note and vitals reviewed.   ED Course  Procedures (including critical care time) Labs Review Labs Reviewed - No data to display  Imaging Review No  results found. I have personally reviewed and evaluated these images and lab results as part of my medical decision-making.   EKG Interpretation None      MDM Pt advised to schedule to see Dr. Thea Gist and follow up with Orthopaedist of choice.    Final diagnoses:  Acute left otitis media, recurrence not specified, unspecified otitis media type    Meds ordered this encounter  Medications  . cefdinir (OMNICEF) 300 MG capsule    Sig: Take 1 capsule (300 mg total) by mouth 2 (two) times daily.    Dispense:  20 capsule    Refill:  0     Order Specific Question:  Supervising Provider    Answer:  MILLER, BRIAN [[id number]]  . oxyCODONE-acetaminophen (PERCOCET/ROXICET) 5-325 MG tablet    Sig: Take 2 tablets by mouth every 4 (four) hours as needed for severe pain.    Dispense:  16 tablet    Refill:  0    Order Specific Question:  Supervising Provider    Answer:  MILLER, BRIAN [[id number]]  . methocarbamol (ROBAXIN) 500 MG tablet    Sig: Take 1 tablet (500 mg total) by mouth 2 (two) times daily.    Dispense:  20 tablet    Refill:  0    Order Specific Question:  Supervising Provider    Answer:  Noemi Chapel [place], PA-C 04/02/15 [place], PA-C 04/02/15 [place], MD 04/02/15 [phone]

## 2015-04-02 NOTE — ED Notes (Signed)
PA at bedside.

## 2015-06-07 ENCOUNTER — Observation Stay (HOSPITAL_COMMUNITY)
Admission: EM | Admit: 2015-06-07 | Discharge: 2015-06-08 | Disposition: A | Discharge status: Home / Self Care | Payer: BLUE CROSS/BLUE SHIELD | Attending: Family Medicine | Admitting: Family Medicine

## 2015-06-07 ENCOUNTER — Inpatient Hospital Stay (HOSPITAL_BASED_OUTPATIENT_CLINIC_OR_DEPARTMENT_OTHER): Payer: BLUE CROSS/BLUE SHIELD

## 2015-06-07 ENCOUNTER — Emergency Department (HOSPITAL_COMMUNITY): Payer: BLUE CROSS/BLUE SHIELD

## 2015-06-07 ENCOUNTER — Inpatient Hospital Stay (HOSPITAL_COMMUNITY): Payer: BLUE CROSS/BLUE SHIELD

## 2015-06-07 ENCOUNTER — Encounter (HOSPITAL_COMMUNITY): Payer: Self-pay | Admitting: Emergency Medicine

## 2015-06-07 DIAGNOSIS — R0602 Shortness of breath: Secondary | ICD-10-CM | POA: Diagnosis present

## 2015-06-07 DIAGNOSIS — Z7982 Long term (current) use of aspirin: Secondary | ICD-10-CM | POA: Diagnosis not present

## 2015-06-07 DIAGNOSIS — R7989 Other specified abnormal findings of blood chemistry: Secondary | ICD-10-CM

## 2015-06-07 DIAGNOSIS — Z79899 Other long term (current) drug therapy: Secondary | ICD-10-CM | POA: Diagnosis not present

## 2015-06-07 DIAGNOSIS — I10 Essential (primary) hypertension: Secondary | ICD-10-CM | POA: Diagnosis not present

## 2015-06-07 DIAGNOSIS — Z7984 Long term (current) use of oral hypoglycemic drugs: Secondary | ICD-10-CM | POA: Diagnosis not present

## 2015-06-07 DIAGNOSIS — Z720 Tobacco use: Secondary | ICD-10-CM | POA: Insufficient documentation

## 2015-06-07 DIAGNOSIS — I5032 Chronic diastolic (congestive) heart failure: Secondary | ICD-10-CM | POA: Insufficient documentation

## 2015-06-07 DIAGNOSIS — I160 Hypertensive urgency: Secondary | ICD-10-CM | POA: Diagnosis not present

## 2015-06-07 DIAGNOSIS — R0981 Nasal congestion: Secondary | ICD-10-CM

## 2015-06-07 DIAGNOSIS — E1122 Type 2 diabetes mellitus with diabetic chronic kidney disease: Secondary | ICD-10-CM

## 2015-06-07 DIAGNOSIS — M25569 Pain in unspecified knee: Secondary | ICD-10-CM

## 2015-06-07 DIAGNOSIS — I130 Hypertensive heart and chronic kidney disease with heart failure and stage 1 through stage 4 chronic kidney disease, or unspecified chronic kidney disease: Secondary | ICD-10-CM | POA: Diagnosis not present

## 2015-06-07 DIAGNOSIS — R079 Chest pain, unspecified: Principal | ICD-10-CM

## 2015-06-07 DIAGNOSIS — N183 Chronic kidney disease, stage 3 unspecified: Secondary | ICD-10-CM

## 2015-06-07 DIAGNOSIS — R778 Other specified abnormalities of plasma proteins: Secondary | ICD-10-CM | POA: Insufficient documentation

## 2015-06-07 DIAGNOSIS — N289 Disorder of kidney and ureter, unspecified: Secondary | ICD-10-CM

## 2015-06-07 DIAGNOSIS — E119 Type 2 diabetes mellitus without complications: Secondary | ICD-10-CM

## 2015-06-07 DIAGNOSIS — R51 Headache: Secondary | ICD-10-CM | POA: Insufficient documentation

## 2015-06-07 LAB — CBC WITH DIFFERENTIAL/PLATELET
BASOS ABS: 0 10*3/uL (ref 0.0–0.1)
BASOS PCT: 0 %
Eosinophils Absolute: 0.4 10*3/uL (ref 0.0–0.7)
Eosinophils Relative: 4 %
HEMATOCRIT: 42.6 % (ref 39.0–52.0)
HEMOGLOBIN: 14.5 g/dL (ref 13.0–17.0)
Lymphocytes Relative: 25 %
Lymphs Abs: 2.6 10*3/uL (ref 0.7–4.0)
MCH: 27 pg (ref 26.0–34.0)
MCHC: 34 g/dL (ref 30.0–36.0)
MCV: 79.2 fL (ref 78.0–100.0)
Monocytes Absolute: 0.9 10*3/uL (ref 0.1–1.0)
Monocytes Relative: 8 %
NEUTROS ABS: 6.4 10*3/uL (ref 1.7–7.7)
NEUTROS PCT: 63 %
Platelets: 201 10*3/uL (ref 150–400)
RBC: 5.38 MIL/uL (ref 4.22–5.81)
RDW: 13.8 % (ref 11.5–15.5)
WBC: 10.2 10*3/uL (ref 4.0–10.5)

## 2015-06-07 LAB — ECHOCARDIOGRAM COMPLETE
Height: 67 in
WEIGHTICAEL: 3439.18 [oz_av]

## 2015-06-07 LAB — LIPID PANEL
CHOL/HDL RATIO: 4.6 ratio
Cholesterol: 157 mg/dL (ref 0–200)
HDL: 34 mg/dL — ABNORMAL LOW (ref >40)
LDL CALC: 106 mg/dL — AB (ref 0–99)
Triglycerides: 85 mg/dL (ref <150)
VLDL: 17 mg/dL (ref 0–40)

## 2015-06-07 LAB — BASIC METABOLIC PANEL
ANION GAP: 8 (ref 5–15)
BUN: 20 mg/dL (ref 6–20)
CO2: 26 mmol/L (ref 22–32)
Calcium: 9.1 mg/dL (ref 8.9–10.3)
Chloride: 109 mmol/L (ref 101–111)
Creatinine, Ser: 1.55 mg/dL — ABNORMAL HIGH (ref 0.61–1.24)
GFR calc Af Amer: 59 mL/min — ABNORMAL LOW (ref >60)
GFR, EST NON AFRICAN AMERICAN: 51 mL/min — AB (ref >60)
Glucose, Bld: 129 mg/dL — ABNORMAL HIGH (ref 65–99)
POTASSIUM: 3.9 mmol/L (ref 3.5–5.1)
SODIUM: 143 mmol/L (ref 135–145)

## 2015-06-07 LAB — COMPREHENSIVE METABOLIC PANEL
ALBUMIN: 3.8 g/dL (ref 3.5–5.0)
ALK PHOS: 85 U/L (ref 38–126)
ALT: 14 U/L — ABNORMAL LOW (ref 17–63)
ANION GAP: 6 (ref 5–15)
AST: 14 U/L — ABNORMAL LOW (ref 15–41)
BUN: 20 mg/dL (ref 6–20)
CHLORIDE: 108 mmol/L (ref 101–111)
CO2: 27 mmol/L (ref 22–32)
CREATININE: 1.54 mg/dL — AB (ref 0.61–1.24)
Calcium: 9.1 mg/dL (ref 8.9–10.3)
GFR calc Af Amer: 59 mL/min — ABNORMAL LOW (ref >60)
GFR calc non Af Amer: 51 mL/min — ABNORMAL LOW (ref >60)
GLUCOSE: 109 mg/dL — AB (ref 65–99)
POTASSIUM: 4.1 mmol/L (ref 3.5–5.1)
SODIUM: 141 mmol/L (ref 135–145)
Total Bilirubin: 0.4 mg/dL (ref 0.3–1.2)
Total Protein: 6.4 g/dL — ABNORMAL LOW (ref 6.5–8.1)

## 2015-06-07 LAB — CBC
HCT: 42.4 % (ref 39.0–52.0)
HEMOGLOBIN: 14.3 g/dL (ref 13.0–17.0)
MCH: 27 pg (ref 26.0–34.0)
MCHC: 33.7 g/dL (ref 30.0–36.0)
MCV: 80.2 fL (ref 78.0–100.0)
PLATELETS: 204 10*3/uL (ref 150–400)
RBC: 5.29 MIL/uL (ref 4.22–5.81)
RDW: 14 % (ref 11.5–15.5)
WBC: 9 10*3/uL (ref 4.0–10.5)

## 2015-06-07 LAB — APTT: aPTT: 28 seconds (ref 24–37)

## 2015-06-07 LAB — TROPONIN I
TROPONIN I: 0.04 ng/mL — AB (ref <0.031)
TROPONIN I: 0.03 ng/mL (ref <0.031)
TROPONIN I: 0.03 ng/mL (ref <0.031)
Troponin I: 0.03 ng/mL (ref <0.031)

## 2015-06-07 LAB — GLUCOSE, CAPILLARY
GLUCOSE-CAPILLARY: 112 mg/dL — AB (ref 65–99)
Glucose-Capillary: 112 mg/dL — ABNORMAL HIGH (ref 65–99)
Glucose-Capillary: 119 mg/dL — ABNORMAL HIGH (ref 65–99)

## 2015-06-07 LAB — MRSA PCR SCREENING: MRSA BY PCR: NEGATIVE

## 2015-06-07 LAB — BRAIN NATRIURETIC PEPTIDE: B Natriuretic Peptide: 48 pg/mL (ref 0.0–100.0)

## 2015-06-07 LAB — PROTIME-INR
INR: 1.01 (ref 0.00–1.49)
PROTHROMBIN TIME: 13.5 s (ref 11.6–15.2)

## 2015-06-07 LAB — HEPARIN LEVEL (UNFRACTIONATED): HEPARIN UNFRACTIONATED: 0.35 [IU]/mL (ref 0.30–0.70)

## 2015-06-07 IMAGING — CR DG KNEE 1-2V*L*
2 series · 2 of 2 positions shown · non-contrast
Comparison: None.

CLINICAL DATA: Fall off lawnmower at work, pain around patella

EXAM:
LEFT KNEE - 1-2 VIEW

[ap]
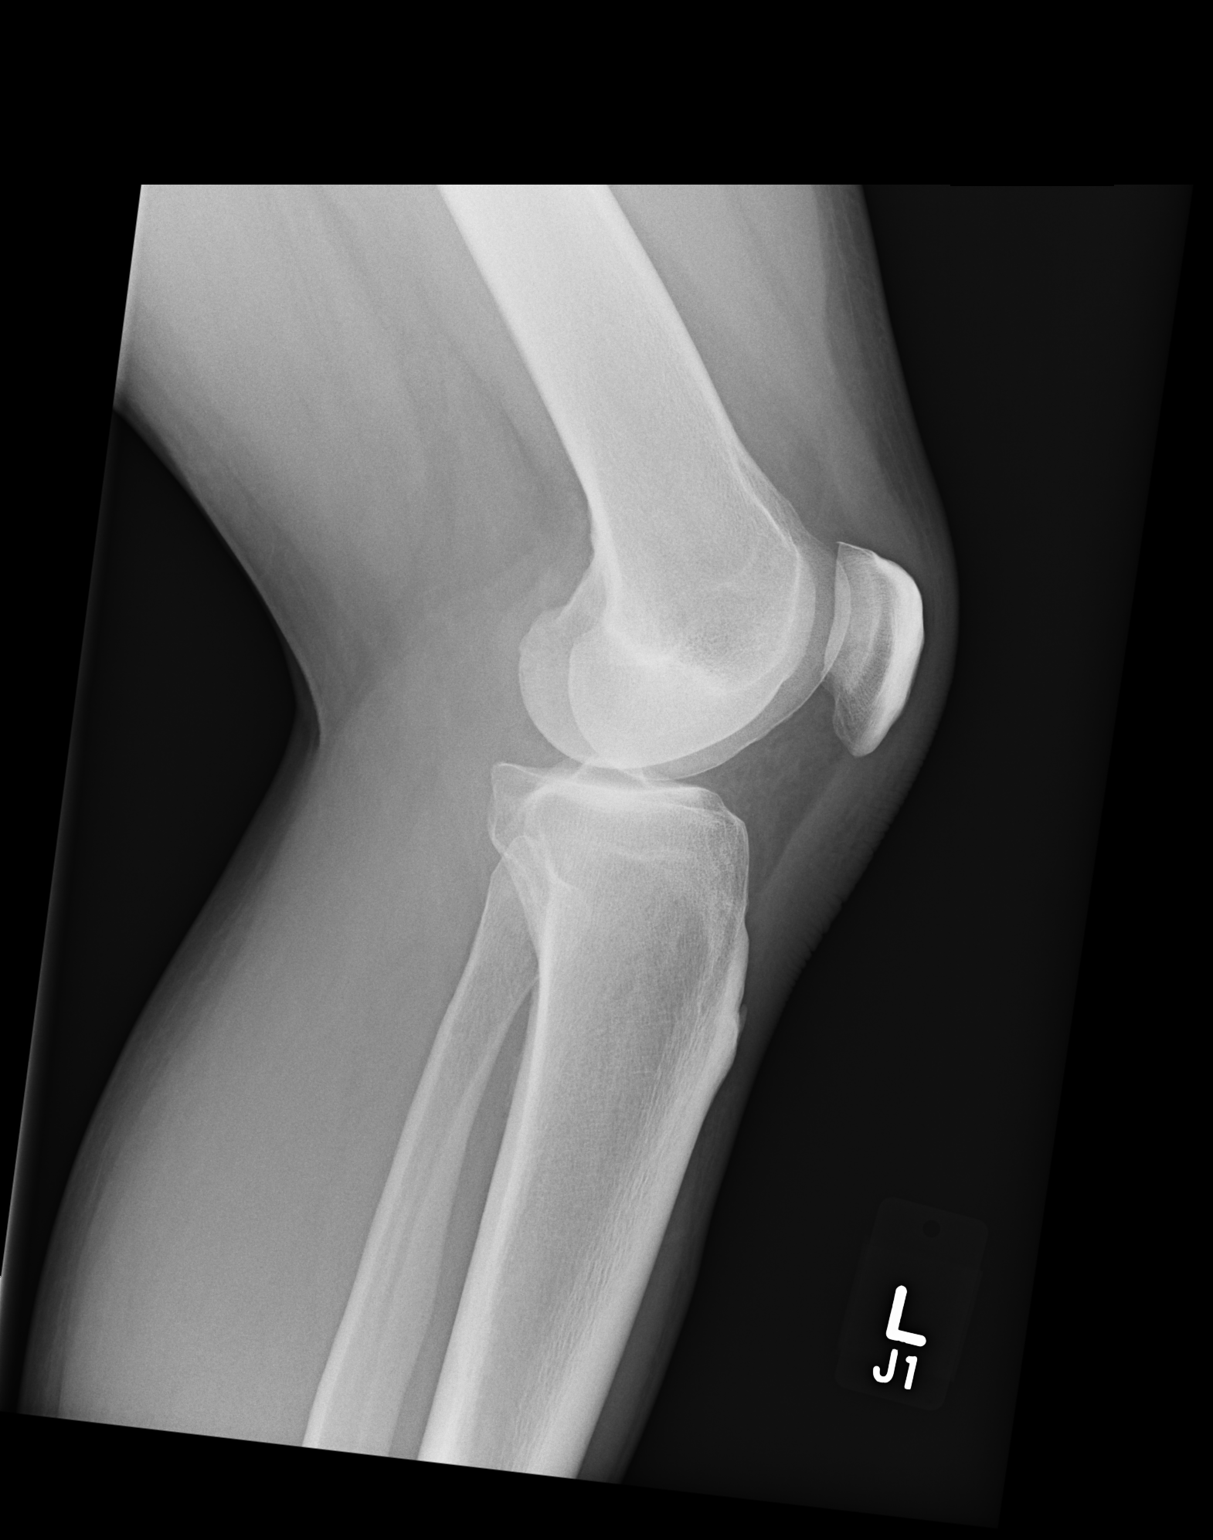

[lat]
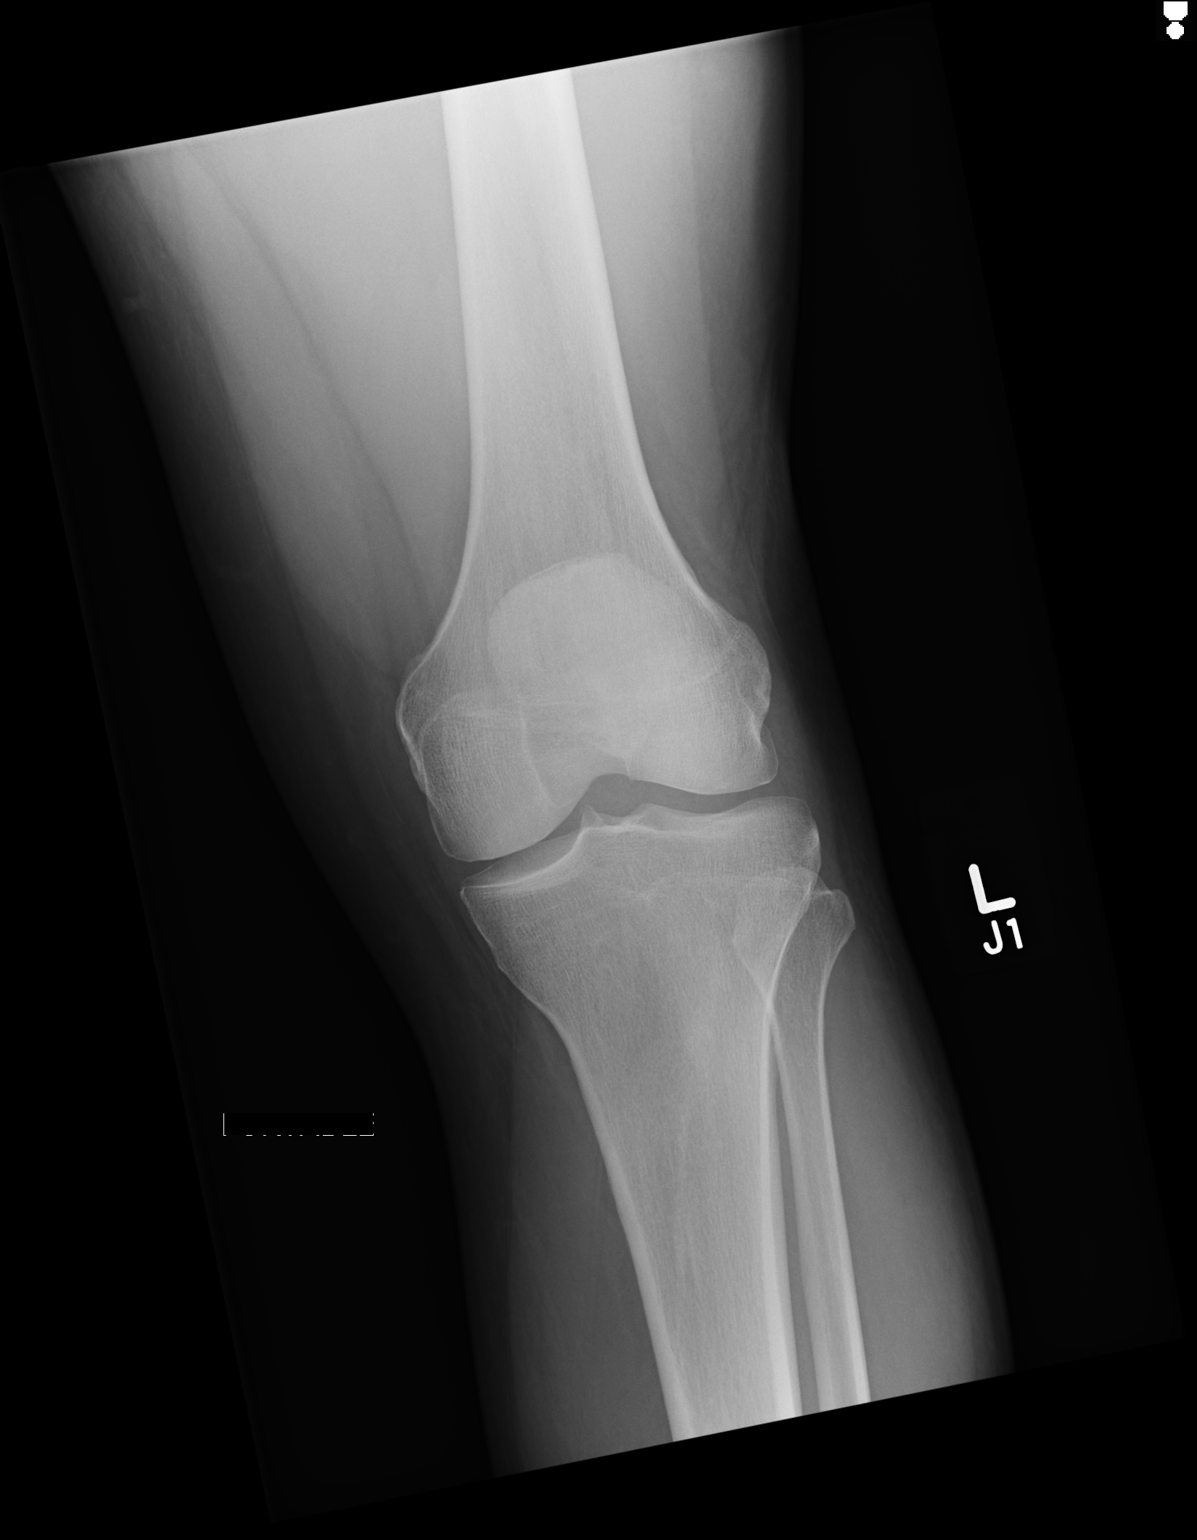

[2 of 2 positions shown; findings below may reference images not displayed]

FINDINGS: Two views of the left knee submitted. No acute fracture or
subluxation. Mild prepatellar soft tissue swelling. No joint
effusion.
IMPRESSION: No acute fracture or subluxation. Mild prepatellar soft tissue
swelling.

## 2015-06-07 IMAGING — DX DG CHEST 2V
2 series · 2 of 2 positions shown · non-contrast
Comparison: Chest radiograph from [DATE]

CLINICAL DATA: Acute onset of shortness of breath and midsternal
chest pain. Nasal congestion and headache. Initial encounter.

EXAM:
CHEST  2 VIEW

[chest pa]
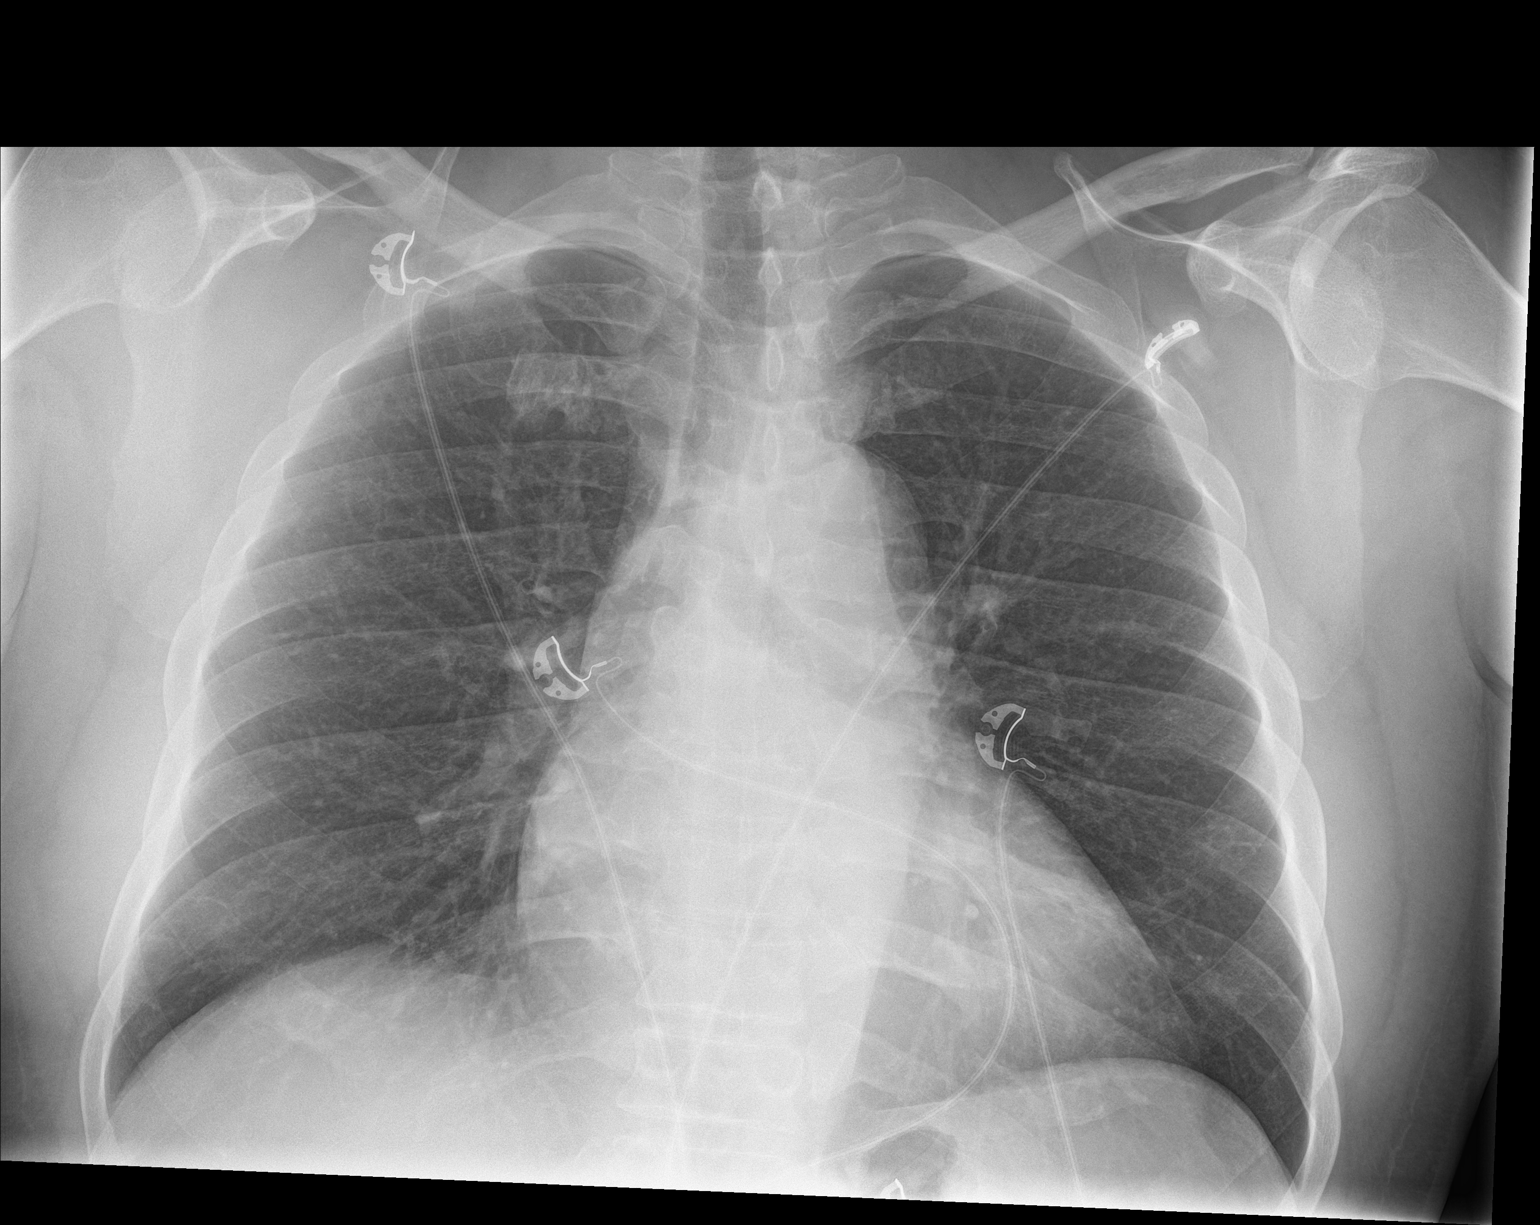

[chest lat]
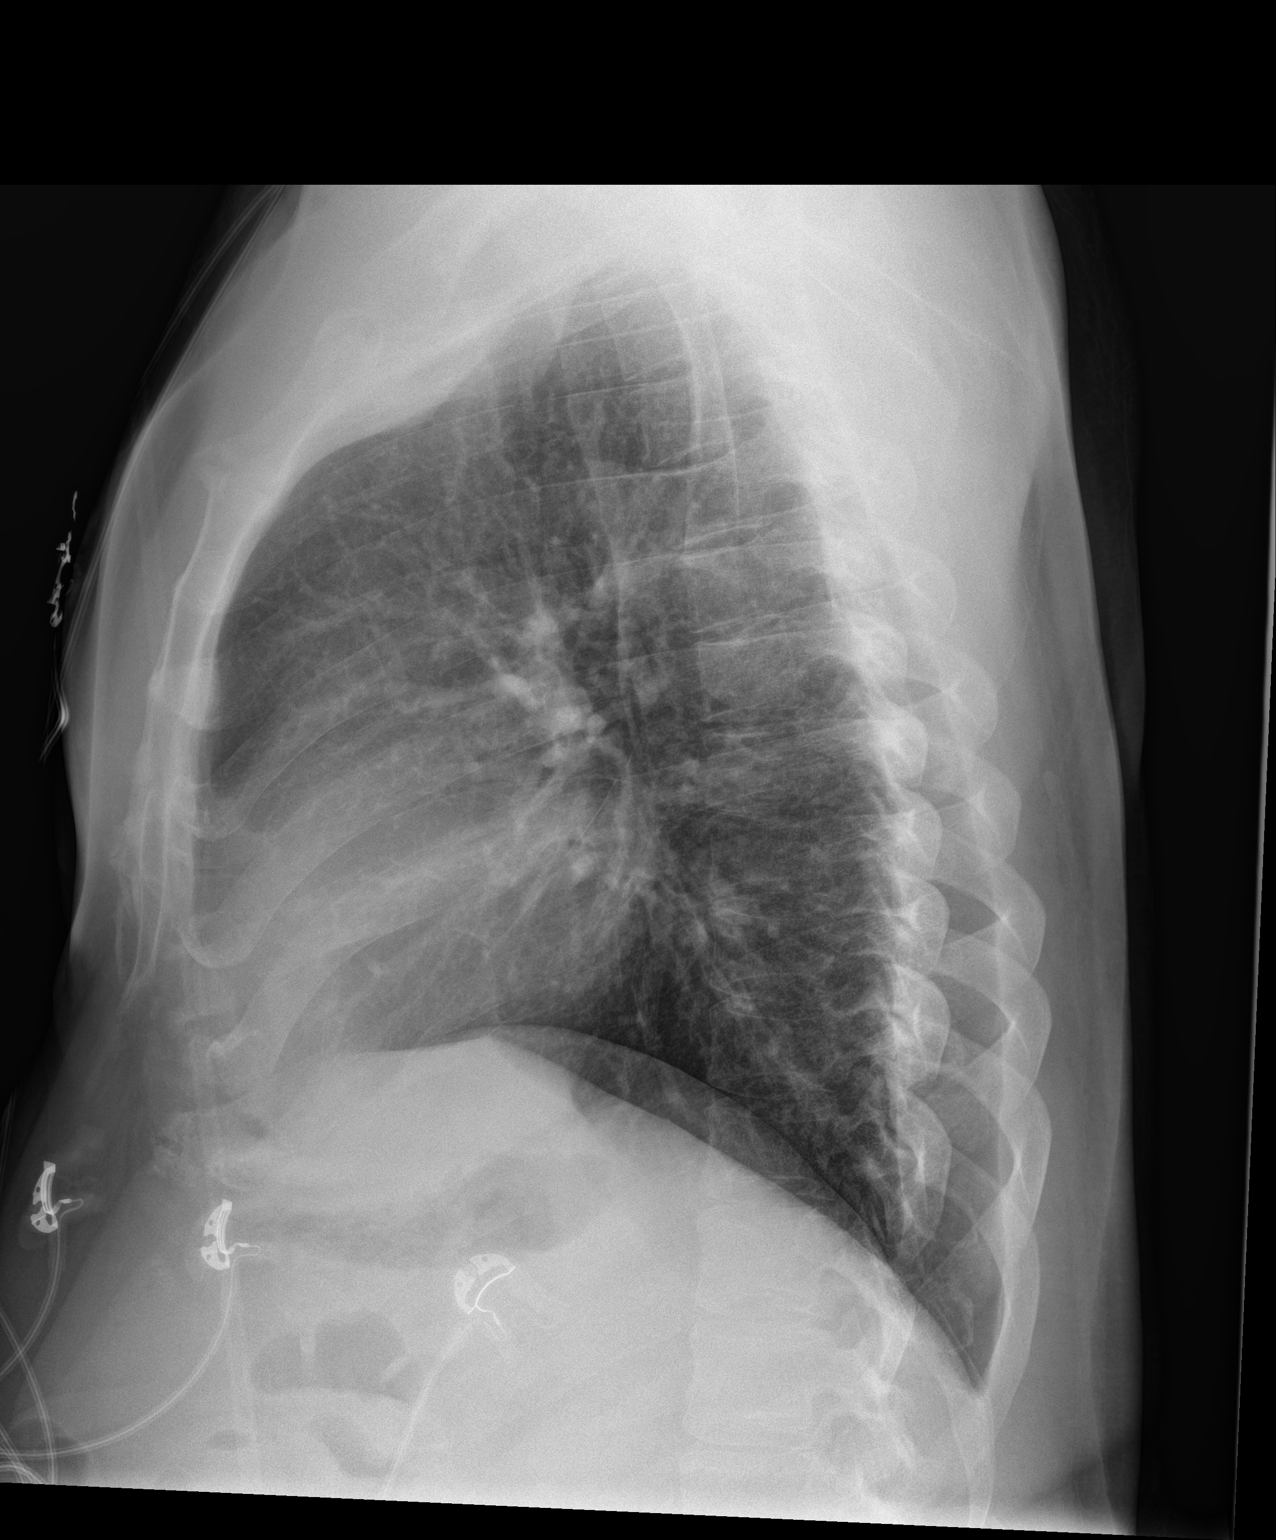

[2 of 2 positions shown; findings below may reference images not displayed]

FINDINGS: The lungs are well-aerated and clear. There is no evidence of focal
opacification, pleural effusion or pneumothorax.

The heart is normal in size; the mediastinal contour is within
normal limits. No acute osseous abnormalities are seen.
IMPRESSION: No acute cardiopulmonary process seen.

## 2015-06-07 MED ORDER — PNEUMOCOCCAL VAC POLYVALENT 25 MCG/0.5ML IJ INJ
0.5000 mL | INJECTION | INTRAMUSCULAR | Status: DC
  Filled 2015-06-07: qty 0.5

## 2015-06-07 MED ORDER — HEPARIN BOLUS VIA INFUSION
4000.0000 [IU] | Freq: Once | INTRAVENOUS | Status: AC
  Administered 2015-06-07: 4000 [IU] via INTRAVENOUS

## 2015-06-07 MED ORDER — ACETAMINOPHEN 325 MG PO TABS
650.0000 mg | ORAL_TABLET | Freq: Four times a day (QID) | ORAL | Status: DC | PRN
  Administered 2015-06-07 – 2015-06-08 (×2): 650 mg via ORAL
  Filled 2015-06-07 (×2): qty 2

## 2015-06-07 MED ORDER — ASPIRIN 81 MG PO CHEW
324.0000 mg | CHEWABLE_TABLET | Freq: Once | ORAL | Status: AC
  Administered 2015-06-07: 324 mg via ORAL
  Filled 2015-06-07: qty 4

## 2015-06-07 MED ORDER — METOPROLOL TARTRATE 50 MG PO TABS
50.0000 mg | ORAL_TABLET | Freq: Two times a day (BID) | ORAL | Status: DC
  Administered 2015-06-07 – 2015-06-08 (×3): 50 mg via ORAL
  Filled 2015-06-07 (×3): qty 1

## 2015-06-07 MED ORDER — HYDROCODONE-ACETAMINOPHEN 5-325 MG PO TABS
1.0000 | ORAL_TABLET | Freq: Four times a day (QID) | ORAL | Status: DC | PRN
  Administered 2015-06-07 – 2015-06-08 (×2): 1 via ORAL
  Filled 2015-06-07 (×2): qty 1

## 2015-06-07 MED ORDER — LOSARTAN POTASSIUM 50 MG PO TABS
25.0000 mg | ORAL_TABLET | Freq: Every day | ORAL | Status: DC
  Administered 2015-06-07 – 2015-06-08 (×2): 25 mg via ORAL
  Filled 2015-06-07 (×2): qty 1

## 2015-06-07 MED ORDER — IPRATROPIUM-ALBUTEROL 0.5-2.5 (3) MG/3ML IN SOLN
3.0000 mL | Freq: Once | RESPIRATORY_TRACT | Status: AC
  Administered 2015-06-07: 3 mL via RESPIRATORY_TRACT
  Filled 2015-06-07: qty 3

## 2015-06-07 MED ORDER — LORATADINE 10 MG PO TABS
10.0000 mg | ORAL_TABLET | Freq: Every day | ORAL | Status: DC
  Administered 2015-06-07 – 2015-06-08 (×2): 10 mg via ORAL
  Filled 2015-06-07 (×2): qty 1

## 2015-06-07 MED ORDER — HEPARIN (PORCINE) IN NACL 100-0.45 UNIT/ML-% IJ SOLN
INTRAMUSCULAR | Status: AC
  Filled 2015-06-07: qty 250

## 2015-06-07 MED ORDER — HEPARIN SODIUM (PORCINE) 5000 UNIT/ML IJ SOLN
5000.0000 [IU] | Freq: Three times a day (TID) | INTRAMUSCULAR | Status: DC
  Administered 2015-06-07 – 2015-06-08 (×2): 5000 [IU] via SUBCUTANEOUS
  Filled 2015-06-07 (×3): qty 1

## 2015-06-07 MED ORDER — ONDANSETRON HCL 4 MG PO TABS: 4.0000 mg | ORAL_TABLET | Freq: Four times a day (QID) | ORAL | Status: DC | PRN

## 2015-06-07 MED ORDER — HEPARIN (PORCINE) IN NACL 100-0.45 UNIT/ML-% IJ SOLN
1050.0000 [IU]/h | INTRAMUSCULAR | Status: DC
  Administered 2015-06-07: 1050 [IU]/h via INTRAVENOUS

## 2015-06-07 MED ORDER — ASPIRIN EC 81 MG PO TBEC
81.0000 mg | DELAYED_RELEASE_TABLET | Freq: Every day | ORAL | Status: DC
  Administered 2015-06-07 – 2015-06-08 (×2): 81 mg via ORAL
  Filled 2015-06-07 (×2): qty 1

## 2015-06-07 MED ORDER — ONDANSETRON HCL 4 MG/2ML IJ SOLN: 4.0000 mg | Freq: Four times a day (QID) | INTRAMUSCULAR | Status: DC | PRN

## 2015-06-07 MED ORDER — SODIUM CHLORIDE 0.9 % IV SOLN
INTRAVENOUS | Status: DC
  Administered 2015-06-07 (×2): via INTRAVENOUS

## 2015-06-07 MED ORDER — LISINOPRIL 10 MG PO TABS
10.0000 mg | ORAL_TABLET | Freq: Every day | ORAL | Status: DC
  Administered 2015-06-07: 10 mg via ORAL
  Filled 2015-06-07: qty 1

## 2015-06-07 MED ORDER — LABETALOL HCL 5 MG/ML IV SOLN
20.0000 mg | INTRAVENOUS | Status: DC | PRN
  Administered 2015-06-07 – 2015-06-08 (×4): 20 mg via INTRAVENOUS
  Filled 2015-06-07 (×4): qty 4

## 2015-06-07 MED ORDER — ACETAMINOPHEN 650 MG RE SUPP: 650.0000 mg | Freq: Four times a day (QID) | RECTAL | Status: DC | PRN

## 2015-06-07 MED ORDER — OXYMETAZOLINE HCL 0.05 % NA SOLN
1.0000 | Freq: Once | NASAL | Status: AC
  Administered 2015-06-07: 1 via NASAL
  Filled 2015-06-07: qty 15

## 2015-06-07 MED ORDER — INSULIN ASPART 100 UNIT/ML ~~LOC~~ SOLN
0.0000 [IU] | Freq: Three times a day (TID) | SUBCUTANEOUS | Status: DC
Start: 2015-06-07 — End: 2015-06-08

## 2015-06-07 NOTE — H&P (Signed)
PCP:   No PCP Per Patient   Chief Complaint:  Shortness of breath  HPI: 50 year old male who   has a past medical history of Hypertension; Asthma; and Bronchitis. today came to the hospital with gradual onset of shortness of breath on exertion, and independent midsternal sharp chest pain. Patient also has been having nasal congestion. Patient has symptoms of nausea but no vomiting. Had loose bowel movements today. Patient has a history of hypertension and has not been taking medications as he could not afford it. In the ED patient found to have mild elevation of troponin 0.04. Started on heparin infusion.  Allergies:   Allergies  Allergen Reactions  . Dust Mite Extract       Past Medical History  Diagnosis Date  . Hypertension   . Asthma   . Bronchitis     Past Surgical History  Procedure Laterality Date  . Lipoma removal      Prior to Admission medications   Medication Sig Start Date End Date Taking? Authorizing Provider  losartan-hydrochlorothiazide (HYZAAR) 100-25 MG tablet Take 1 tablet by mouth daily. 04/02/15  Yes Hollace Kinnier Sofia, PA-C  amoxicillin (AMOXIL) 500 MG capsule Take 1 capsule (500 mg total) by mouth 3 (three) times daily. 03/29/15   Milton Ferguson, MD  aspirin EC 81 MG tablet Take 1 tablet (81 mg total) by mouth daily. 10/13/13   Arnoldo Lenis, MD  Aspirin-Salicylamide-Caffeine [facility] HEADACHE) [id number] MG TABS Take 1 packet by mouth daily as needed (for pain).    Historical Provider, MD  cefdinir (OMNICEF) 300 MG capsule Take 1 capsule (300 mg total) by mouth 2 (two) times daily. 04/02/15   Fransico Meadow, PA-C  HYDROcodone-acetaminophen (NORCO/VICODIN) 5-325 MG tablet Take 1 tablet by mouth every 6 (six) hours as needed. 03/29/15   Milton Ferguson, MD  loratadine (CLARITIN) 10 MG tablet Take 10 mg by mouth daily.    Historical Provider, MD  methocarbamol (ROBAXIN) 500 MG tablet Take 1 tablet (500 mg total) by mouth 2 (two) times daily. 04/02/15   Fransico Meadow, PA-C    oxyCODONE-acetaminophen (PERCOCET/ROXICET) 5-325 MG tablet Take 2 tablets by mouth every 4 (four) hours as needed for severe pain. 04/02/15   Fransico Meadow, PA-C  pseudoephedrine (SUDAFED) 30 MG tablet Take 30 mg by mouth every 4 (four) hours as needed for congestion.    Historical Provider, MD    Social History:  reports that he has been smoking Cigarettes.  He has a 12.5 pack-year smoking history. He does not have any smokeless tobacco history on file. He reports that he drinks alcohol. He reports that he does not use illicit drugs.  Patient's father has a history of CAD, hypertension  Filed Weights   06/07/15 0033  Weight: 92.987 kg (205 lb)    All the positives are listed in BOLD  Review of Systems:  HEENT: Headache, blurred vision, runny nose, sore throat Neck: Hypothyroidism, hyperthyroidism,,lymphadenopathy Chest : Shortness of breath, history of COPD, Asthma Heart : Chest pain, history of coronary arterey disease GI:  Nausea, vomiting, diarrhea, constipation, GERD GU: Dysuria, urgency, frequency of urination, hematuria Neuro: Stroke, seizures, syncope Psych: Depression, anxiety, hallucinations   Physical Exam: Blood pressure 182/109, pulse 64, temperature 97.4 F (36.3 C), resp. rate 18, height 5\' 7"  (1.702 m), weight 92.987 kg (205 lb), SpO2 99 %. Constitutional:   Patient is a well-developed and well-nourished male in no acute distress and cooperative with exam. Head: Normocephalic and atraumatic Mouth: Mucus membranes  moist Eyes: PERRL, EOMI, conjunctivae normal Neck: Supple, No Thyromegaly Cardiovascular: RRR, S1 normal, S2 normal Pulmonary/Chest: CTAB, no wheezes, rales, or rhonchi Abdominal: Soft. Non-tender, non-distended, bowel sounds are normal, no masses, organomegaly, or guarding present.  Neurological: A&O x3, Strength is normal and symmetric bilaterally, cranial nerve II-XII are grossly intact, no focal motor deficit, sensory intact to light touch  bilaterally.  Extremities : No Cyanosis, Clubbing or Edema  Labs on Admission:  Basic Metabolic Panel:  Recent Labs Lab 06/07/15 0115  NA 143  K 3.9  CL 109  CO2 26  GLUCOSE 129*  BUN 20  CREATININE 1.55*  CALCIUM 9.1   CBC:  Recent Labs Lab 06/07/15 0115  WBC 10.2  NEUTROABS 6.4  HGB 14.5  HCT 42.6  MCV 79.2  PLT 201   Cardiac Enzymes:  Recent Labs Lab 06/07/15 0115  TROPONINI 0.04*    BNP (last 3 results)  Recent Labs  06/07/15 0115  BNP 48.0     Radiological Exams on Admission: Dg Chest 2 View  06/07/2015  CLINICAL DATA:  Acute onset of shortness of breath and midsternal chest pain. Nasal congestion and headache. Initial encounter. EXAM: CHEST  2 VIEW COMPARISON:  Chest radiograph from 06/28/2014 FINDINGS: The lungs are well-aerated and clear. There is no evidence of focal opacification, pleural effusion or pneumothorax. The heart is normal in size; the mediastinal contour is within normal limits. No acute osseous abnormalities are seen. IMPRESSION: No acute cardiopulmonary process seen. Electronically Signed   By: Garald Balding M.D.   On: 06/07/2015 01:33    EKG: Independently reviewed.  Sinus rhythm, LVH strain pattern   Assessment/Plan Active Problems:   Chest pain   Hypertensive urgency   Chest pain Admit the patient to stepdown unit. Continue heparin per pharmacy consultation, aspirin. Morphine when necessary for chest pain. Will cycle troponin every 6 hours 3. Consult cardiology in a.m.  Hypertension, uncontrolled Patient has uncontrolled hypertension, we'll start metoprolol 50 mg by mouth twice a day Lisinopril 10 mg by mouth daily  Diabetes mellitus *Sliding-scale insulin with NovoLog, check hemoglobin A1c.  Chronic diastolic CHF Patient has history of diastolic CHF, with echocardiogram in 2015 showed grade 2 diastolic dysfunction No acute exacerbation at this time. No pulmonary edema seen on chest x-ray.  DVT  prophylaxis Patient on heparin  Code status: Full code  Family discussion: Admission, patients condition and plan of care including tests being ordered have been discussed with the patient and his wife at bedside who indicate understanding and agree with the plan and Code Status.   Time Spent on Admission: 60 min  [facility] Pager: [phone] 06/07/2015, 2:56 AM  If 7PM-7AM, please contact night-coverage  [email or web address]  Password [email or web address]

## 2015-06-07 NOTE — Progress Notes (Signed)
PROGRESS NOTE  Craig Knight [id number] DOB: 08-05-1965 DOA: 06/07/2015 PCP: No PCP Per Patient  Summary: 73 yom with past medical history of HTN, asthma, and bronchitis presented with gradual onset of DOE and midsternal sharp chest pain. Also complained of nasal congestion, loose stool, and nausea. In the ED troponin .04 and it was noted that patient is noncompliant with antihypertensives. He has been admitted for further evaluation of chest pain and cardiology consult.    Assessment/Plan: 1. Chest pain. Intermittent, likely noncardiac. Troponin now normal. Cardiology has recommended conservative management, follow-up echocardiogram and discontinue heparin infusion. Repeat troponin negative, EKG sinus rhythm, LVH with strain, no acute changes.. 2. Accelerated hypertension. Secondary to noncompliancerted on metoprolol 50 BID. Lisinopril 10mg  daily.  3. DM Type 2. Started on SSI. A1c in process.  4. Chronic diastolic CHF with severe LVH. ECHO 2015 revealed grade 2 diastolic dysfunction. Appears compensated. 5. Chronic kidney disease stage III secondary to hypertension. Stable. 6. Tobacco use disorder    Appears to be stable. Discussed with Dr. Harl Bowie. Agree with management. Will follow-up echocardiogram. Continue aspirin and antihypertensives. No inpatient cardiology testing is planned at this point.  Anticipate home next 24 hours.  Code Status: Full DVT prophylaxis: Heparin Family Communication: Wife and daughter bedside.  Disposition Plan:   [name], MD  [facility] Direct contact:  --Via amion app OR  --[email or web address]; password [email or web address] and click  [email or web address] contact night coverage as above 06/07/2015, 10:36 AM  LOS: 0 days   Consultants:  Cardiology   Procedures:  None  Antibiotics:  None  HPI/Subjective: Difficulty in taking deep breath, otherwise breathing improved. Still having sharp intermittent, central chest discomfort. Pain has been noted for about 2  weeks. Mild nausea and vomiting as outpatient and noticeable swelling in BLE. Denies history of blood clot or recent long travel. Denies any injury eyes or problems in vision. Does take baby ASA for his blood pressure.   Objective: Filed Vitals:   06/07/15 0630 06/07/15 0730 06/07/15 0733 06/07/15 0734  BP: 163/110  165/100   Pulse: 63   67  Temp:  98 F (36.7 C)    TempSrc:  Oral    Resp: 18     Height:      Weight:      SpO2: 99%       Intake/Output Summary (Last 24 hours) at 06/07/15 1036 Last data filed at 06/07/15 0700  Gross per 24 hour  Intake  45.15 ml  Output    350 ml  Net -304.85 ml     Filed Weights   06/07/15 0033 06/07/15 0400  Weight: 92.987 kg (205 lb) 97.5 kg (214 lb 15.2 oz)    Exam:  Afebrile, mild hypertension, not hypoxic  General:  Appears calm and comfortable. Lying in bed.  Eyes: Normal lids, irises & conjunctiva. Pupils: Left 59mm,  Right 2 mm ENT: grossly normal hearing, lips & tongue Cardiovascular: RRR, no m/r/g. No LE edema. Telemetry: SR, no arrhythmias  Respiratory: CTA bilaterally, no w/r/r. Normal respiratory effort. Musculoskeletal: grossly normal tone BUE/BLE Psychiatric: grossly normal mood and affect, speech fluent and appropriate Neurologic: grossly non-focal.  New data reviewed:  Creatinine 1.54, appears to be a baseline  Troponin now normal  CMP unremarkable  CBC unremarkable    Scheduled Meds: . aspirin EC  81 mg Oral Daily  . heparin subcutaneous  5,000 Units Subcutaneous 3 times per day  . loratadine  10 mg Oral Daily  . losartan  25 mg Oral  Daily  . metoprolol tartrate  50 mg Oral BID  . [START ON 06/08/2015] pneumococcal 23 valent vaccine  0.5 mL Intramuscular Tomorrow-1000   Continuous Infusions: . sodium chloride 10 mL/hr at 06/07/15 0415    Principal Problem:   Chest pain Active Problems:   HTN (hypertension)   DM type 2 (diabetes mellitus, type 2) ([place])   Hypertensive urgency   CKD (chronic kidney  disease), stage III  By signing my name below, I, Rennis Harding attest that this documentation has been prepared under the direction and in the presence of [name], MD Electronically signed: Rennis Harding  06/07/2015 9:17am   I personally performed the services described in this documentation. All medical record entries made by the scribe were at my direction. I have reviewed the chart and agree that the record reflects my personal performance and is accurate and complete. [name], MD

## 2015-06-07 NOTE — Consult Note (Signed)
Reason for Consult: chest pain and SOB  Referring Physician: Dr. Darrick Meigs   PCP:  No PCP Per Patient  Primary Cardiologist:  Dr. Harl Bowie- seen in office 10/2013   Craig Knight is an 50 y.o. male.    Chief Complaint: admitted early AM with SOB, and sharp chest pain.    HPI: Asked to see 50 year old male with hx HTN, Asthma, and hx bronchitis, hyperlipidemia, CKD, DM-2 was on metformin 500.Marland Kitchen  He has been having DOE, and at [place] time midsternal sharp chest pain.  He states he has chest pain with exertion and associated SOB.  No nausea.   He has not been taking HTN meds due to lack of finances.   On presentation he complained of nasal congestion, SOB and headache.   BP on arrival 175/  With MAP of 154 - he has rec'd lisinopril and metoprolol.  Also rec'd labetalol for HTN, 20 mg X 2 doses.  + tobacco use 6 cigarettes a day.  Works in Biomedical [employer].   (on [employer] notes he had cough with lisinopril)   In ER Troponin 0.04, follow up 0.03.  IV heparin was started.  Cr 1.54  EKG with SR, LVH with T wave in version lat leads and Q waves in V2-3.  Similar EKGs date back to 2013.   CXR no acute process.    Currently no specific complaints of chest pain.  No SOB but BP 157/127. Last Echo 2015: Study Conclusions  - Left ventricle: The cavity size was normal. Wall thickness was increased in a pattern of severe LVH. Systolic function was normal. The estimated ejection fraction was in the range of 55% to 60%. Wall motion was normal; there were no regional wall motion abnormalities. Features are consistent with a pseudonormal left ventricular filling pattern, with concomitant abnormal relaxation and increased filling pressure (grade 2 diastolic dysfunction). Doppler parameters are consistent with elevated ventricular end-diastolic filling pressure. - Mitral valve: There was mild regurgitation. - Right atrium: Central venous pressure (est): 3 mm Hg. - Atrial septum: No  defect or patent foramen ovale was identified. - Tricuspid valve: There was trivial regurgitation. - Pulmonary arteries: Systolic pressure could not be accurately estimated. - Pericardium, extracardiac: There was no pericardial effusion.  Impressions:  - Severe LVH with LVEF 55-60% and grade 2 diastolic dysfunction with increased filling pressures. Mild mitral regurgitation. Trivial tricuspid regurgitation, unable to assess PASP.  Last Nuc stress 2013 with no reversible ischemia or infarction, +septal and inf. Wall hypokinesia, LV dilatation, EF 51%.    Past Medical History  Diagnosis Date  . Hypertension   . Asthma   . Bronchitis     Past Surgical History  Procedure Laterality Date  . Lipoma removal      Family History  Problem Relation Age of Onset  . Stroke Mother   . Heart attack Father 51    CABG  . Hypertension Sister   . Stroke Brother   . Hypertension Brother    Social History:  reports that he has been smoking Cigarettes.  He has a 12.5 pack-year smoking history. He does not have any smokeless tobacco history on file. He reports that he drinks alcohol. He reports that he does not use illicit drugs.  Allergies:  Allergies  Allergen Reactions  . Dust Mite Extract   Lisinopril causes cough.   Outpatient medications: None due to finances  Current Medications: Scheduled Meds: . aspirin EC  81 mg Oral  Daily  . lisinopril  10 mg Oral Daily  . loratadine  10 mg Oral Daily  . metoprolol tartrate  50 mg Oral BID  . [START ON 06/08/2015] pneumococcal 23 valent vaccine  0.5 mL Intramuscular Tomorrow-1000   Continuous Infusions: . sodium chloride 10 mL/hr at 06/07/15 0415  . heparin 1,050 Units/hr (06/07/15 0700)   PRN Meds:.acetaminophen **OR** acetaminophen, HYDROcodone-acetaminophen, labetalol, ondansetron **OR** ondansetron (ZOFRAN) IV  Results for orders placed or performed during the hospital encounter of 06/07/15 (from the past 48 hour(s))  Basic  metabolic panel     Status: Abnormal   Collection Time: 06/07/15  1:15 AM  Result Value Ref Range   Sodium 143 135 - 145 mmol/L   Potassium 3.9 3.5 - 5.1 mmol/L   Chloride 109 101 - 111 mmol/L   CO2 26 22 - 32 mmol/L   Glucose, Bld 129 (H) 65 - 99 mg/dL   BUN 20 6 - 20 mg/dL   Creatinine, Ser 1.55 (H) 0.61 - 1.24 mg/dL   Calcium 9.1 8.9 - 10.3 mg/dL   GFR calc non Af Amer 51 (L) >60 mL/min   GFR calc Af Amer 59 (L) >60 mL/min    Comment: (NOTE) The eGFR has been calculated using the CKD EPI equation. This calculation has not been validated in all clinical situations. eGFR's persistently <60 mL/min signify possible Chronic Kidney Disease.    Anion gap 8 5 - 15  Troponin I     Status: Abnormal   Collection Time: 06/07/15  1:15 AM  Result Value Ref Range   Troponin I 0.04 (H) <0.031 ng/mL    Comment:        PERSISTENTLY INCREASED TROPONIN VALUES IN THE RANGE OF 0.04-0.49 ng/mL CAN BE SEEN IN:       -UNSTABLE ANGINA       -CONGESTIVE HEART FAILURE       -MYOCARDITIS       -CHEST TRAUMA       -ARRYHTHMIAS       -LATE PRESENTING MYOCARDIAL INFARCTION       -COPD   CLINICAL FOLLOW-UP RECOMMENDED.   CBC with Differential     Status: None   Collection Time: 06/07/15  1:15 AM  Result Value Ref Range   WBC 10.2 4.0 - 10.5 K/uL   RBC 5.38 4.22 - 5.81 MIL/uL   Hemoglobin 14.5 13.0 - 17.0 g/dL   HCT 42.6 39.0 - 52.0 %   MCV 79.2 78.0 - 100.0 fL   MCH 27.0 26.0 - 34.0 pg   MCHC 34.0 30.0 - 36.0 g/dL   RDW 13.8 11.5 - 15.5 %   Platelets 201 150 - 400 K/uL   Neutrophils Relative % 63 %   Neutro Abs 6.4 1.7 - 7.7 K/uL   Lymphocytes Relative 25 %   Lymphs Abs 2.6 0.7 - 4.0 K/uL   Monocytes Relative 8 %   Monocytes Absolute 0.9 0.1 - 1.0 K/uL   Eosinophils Relative 4 %   Eosinophils Absolute 0.4 0.0 - 0.7 K/uL   Basophils Relative 0 %   Basophils Absolute 0.0 0.0 - 0.1 K/uL  Brain natriuretic peptide     Status: None   Collection Time: 06/07/15  1:15 AM  Result Value Ref  Range   B Natriuretic Peptide 48.0 0.0 - 100.0 pg/mL  APTT     Status: None   Collection Time: 06/07/15  1:15 AM  Result Value Ref Range   aPTT 28 24 - 37 seconds  Protime-INR  Status: None   Collection Time: 06/07/15  1:15 AM  Result Value Ref Range   Prothrombin Time 13.5 11.6 - 15.2 seconds   INR 1.01 0.00 - 1.49  Troponin I     Status: None   Collection Time: 06/07/15  2:25 AM  Result Value Ref Range   Troponin I 0.03 <0.031 ng/mL    Comment:        NO INDICATION OF MYOCARDIAL INJURY.   CBC     Status: None   Collection Time: 06/07/15  2:35 AM  Result Value Ref Range   WBC 9.0 4.0 - 10.5 K/uL   RBC 5.29 4.22 - 5.81 MIL/uL   Hemoglobin 14.3 13.0 - 17.0 g/dL   HCT 42.4 39.0 - 52.0 %   MCV 80.2 78.0 - 100.0 fL   MCH 27.0 26.0 - 34.0 pg   MCHC 33.7 30.0 - 36.0 g/dL   RDW 14.0 11.5 - 15.5 %   Platelets 204 150 - 400 K/uL  Comprehensive metabolic panel     Status: Abnormal   Collection Time: 06/07/15  2:35 AM  Result Value Ref Range   Sodium 141 135 - 145 mmol/L   Potassium 4.1 3.5 - 5.1 mmol/L   Chloride 108 101 - 111 mmol/L   CO2 27 22 - 32 mmol/L   Glucose, Bld 109 (H) 65 - 99 mg/dL   BUN 20 6 - 20 mg/dL   Creatinine, Ser 1.54 (H) 0.61 - 1.24 mg/dL   Calcium 9.1 8.9 - 10.3 mg/dL   Total Protein 6.4 (L) 6.5 - 8.1 g/dL   Albumin 3.8 3.5 - 5.0 g/dL   AST 14 (L) 15 - 41 U/L   ALT 14 (L) 17 - 63 U/L   Alkaline Phosphatase 85 38 - 126 U/L   Total Bilirubin 0.4 0.3 - 1.2 mg/dL   GFR calc non Af Amer 51 (L) >60 mL/min   GFR calc Af Amer 59 (L) >60 mL/min    Comment: (NOTE) The eGFR has been calculated using the CKD EPI equation. This calculation has not been validated in all clinical situations. eGFR's persistently <60 mL/min signify possible Chronic Kidney Disease.    Anion gap 6 5 - 15  MRSA PCR Screening     Status: None   Collection Time: 06/07/15  3:50 AM  Result Value Ref Range   MRSA by PCR NEGATIVE NEGATIVE    Comment:        The GeneXpert MRSA Assay  (FDA approved for NASAL specimens only), is one component of a comprehensive MRSA colonization surveillance program. It is not intended to diagnose MRSA infection nor to guide or monitor treatment for MRSA infections.    Dg Chest 2 View  06/07/2015  CLINICAL DATA:  Acute onset of shortness of breath and midsternal chest pain. Nasal congestion and headache. Initial encounter. EXAM: CHEST  2 VIEW COMPARISON:  Chest radiograph from 06/28/2014 FINDINGS: The lungs are well-aerated and clear. There is no evidence of focal opacification, pleural effusion or pneumothorax. The heart is normal in size; the mediastinal contour is within normal limits. No acute osseous abnormalities are seen. IMPRESSION: No acute cardiopulmonary process seen. Electronically Signed   By: Garald Balding M.D.   On: 06/07/2015 01:33    ROS: General:+ colds no fevers, no weight changes Skin:no rashes or ulcers HEENT:no blurred vision, no congestion CV:see HPI, does have lower ext edema at times. PUL:see HPI GI:no diarrhea constipation or melena, no indigestion GU:no hematuria, no dysuria MS:no joint pain,  no claudication Neuro:no syncope, occ lightheadedness Endo:+ diabetes, no thyroid disease  Blood pressure 165/100, pulse 67, temperature 98 F (36.7 C), temperature source Oral, resp. rate 18, height _0  (1.702 m), weight 214 lb 15.2 oz (97.5 kg), SpO2 99 %.  Wt Readings from Last 3 Encounters:  06/07/15 214 lb 15.2 oz (97.5 kg)  04/02/15 215 lb (97.523 kg)  03/29/15 215 lb (97.523 kg)    PE: General:Pleasant affect, NAD, BP remains elevated.  Skin:Warm and dry, brisk capillary refill HEENT:normocephalic, sclera clear, mucus membranes moist Neck:supple, mild JVD, no bruits  Heart:S1S2 RRR without murmur, gallup, rub or click Lungs:clear without rales, rhonchi, or wheezes VHQ:IONG, non tender, + BS, do not palpate liver spleen or masses Ext:no lower ext edema, 2+ pedal pulses, 2+ radial pulses Neuro:alert  and oriented X 3, MAE, follows commands, + facial symmetry    Assessment/Plan Active Problems:   Chest pain   Hypertensive urgency   HTN needs financial assistance would have care manager see if anything could be offered.  He cannot afford $4 for meds at times.   --hx of cough with lisinopril will change to losartan -on last OV had changed the metoprolol to labetalol   Chest pain last neg nuc in 2013.  Chest pain may be due to HTN though with risk factors of DM, HTN, tobacco use and FH of heart disease may be prudent to do lexsican once has improved BP control.   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  Dr. Harl Bowie to see.   [place] Practitioner [facility] Pager [phone] or after 5pm or weekends call [phone] 06/07/2015, 8:36 AM     Patient see and discussed with PA Dorene Ar, I agree with her documentation.Long history of atypicalchest pain with negative stress testing 07/2011, HTN, chronic diastolic HF, hyperlipidemia, CKD, DM2, and medication noncompliance admitted with SOB and chest. He has been off is meds at home due to costs.  He reports a 1 week history of intermittent sharp 2/10 right sided chest pain. Worst with movement, some SOB. Would last only a few seconds, did not think much of it. Episode last night about 1130pm. He woke up to go to the bathroom, walked to check on kids as well. On way back to his room had sudden SOB, said his throat and nose suddenly felt clogged up and couldn't get his breath. He Had 6/10 chest pressure that lasted approx 1 hour. Breathing and chest pain improved after breathing treatment.    ER vitals: 202/127 p 75 99% RA BNP 48, Hgb 14.5, Plt 201, Cr 1.55, K 3.9, Trop 0.04-->0.03-->0.03 CXR no acute process EKG SR with LVH, chronic strain pattern Echo 08/2013: LVEF 55-60%, grade II diastolic dysfunction  Atypical presentation for cardiac ischemia. Feeling of throat and nose becoming clogged  up leading to SOB and chest pressure, resolved with breathing treatment. Pain lasted an hour, if truly ischemic for that duration cardiac markers would be more impressive. Long history of atypical chest pain with negative stress testing in 2013. He also presented with severe HTN with SBPs in the 200s. Started on oral regmimen for bp control, will order echo to evaluate for any new cardiac dysfunction. Would recommend outpatient PFTs given smoking history and improved symptoms with breathing treatment. Do not anticipate ischemic testing at this time, will start diet, stop heparin drip. Patient will need social work consult to assist with home medications.   Zandra Abts MD

## 2015-06-07 NOTE — Progress Notes (Signed)
ANTICOAGULATION CONSULT NOTE - Preliminary  Pharmacy Consult for heparin Indication: chest pain/ACS  Allergies  Allergen Reactions  . Dust Mite Extract     Patient Measurements: Height: 5\' 7"  (170.2 cm) Weight: 205 lb (92.987 kg) IBW/kg (Calculated) : 66.1 HEPARIN DW (KG): 85.7   Vital Signs: Temp: 97.4 F (36.3 C) (04/12 0033) BP: 202/127 mmHg (04/12 0033) Pulse Rate: 75 (04/12 0033)  Labs:  Recent Labs  06/07/15 0115  HGB 14.5  HCT 42.6  PLT 201  CREATININE 1.55*  TROPONINI 0.04*   Estimated Creatinine Clearance: 62 mL/min (by C-G formula based on Cr of 1.55).  Medical History: Past Medical History  Diagnosis Date  . Hypertension   . Asthma   . Bronchitis     Medications:  Scheduled:  . heparin  4,000 Units Intravenous Once  . oxymetazoline  1 spray Each Nare Once   Infusions:  . heparin     PRN: labetalol  Assessment: 50 yo male with sharp, intermittent mid sternal chest pain, SOB, and headache.  Troponin borderline elevated, starting heparin for possible ACS.   Goal of Therapy:  Heparin level 0.3-0.7 units/ml   Plan:  Give 4000 units bolus x 1 Start heparin infusion at 1050 units/hr Check anti-Xa level in 6 hours and daily while on heparin Continue to monitor H&H and platelets Preliminary review of pertinent patient information completed.  Forestine Na clinical pharmacist will complete review during morning rounds to assess the patient and finalize treatment regimen.  Nyra Capes, [facility] 06/07/2015,2:22 AM

## 2015-06-07 NOTE — ED Provider Notes (Signed)
CSN: [id number]     Arrival date & time 06/07/15  0027 History   By signing my name below, I, Nicole Kindred, attest that this documentation has been prepared under the direction and in the presence of Delora Fuel, MD.   Electronically Signed: Nicole Kindred, ED Scribe. 06/07/2015. 12:40 AM   Chief Complaint  Patient presents with  . Shortness of Breath    The history is provided by the patient. No language interpreter was used.   HPI Comments: Craig Knight is a 50 y.o. male with PMHx of HTN, asthma, and bronchitis who presents to the Emergency Department complaining of gradual onset, constant shortness of breath, onset earlier tonight. He states symptoms began when he woke up. Pt reports associated sharp, intermittent mid sternal chest pain, nasal congestion, and headache. No other associated symptoms noted. Breathing is worsened when pt closes his mouth and improved when he opens his mouth. No other worsening or alleviating factors noted. Pt denies any other pertinent symptoms. Pt is a current smoker x .5 packs/day. Pt does not currently have a PCP. Pt used to see a Dr. Harl Bowie (cardiologist) for his hypertension but states that he could not afford to keep going.   Past Medical History  Diagnosis Date  . Hypertension   . Asthma   . Bronchitis    Past Surgical History  Procedure Laterality Date  . Lipoma removal     History reviewed. No pertinent family history. Social History  Substance Use Topics  . Smoking status: Current Every Day Smoker -- 0.50 packs/day for 25 years    Types: Cigarettes  . Smokeless tobacco: None  . Alcohol Use: Yes     Comment: occ    Review of Systems  HENT: Positive for congestion.   Respiratory: Positive for shortness of breath.   Cardiovascular: Positive for chest pain.  Neurological: Positive for headaches.  All other systems reviewed and are negative.   Allergies  Dust mite extract  Home Medications   Prior to Admission  medications   Medication Sig Start Date End Date Taking? Authorizing Provider  losartan-hydrochlorothiazide (HYZAAR) 100-25 MG tablet Take 1 tablet by mouth daily. 04/02/15  Yes Hollace Kinnier Sofia, PA-C  amoxicillin (AMOXIL) 500 MG capsule Take 1 capsule (500 mg total) by mouth 3 (three) times daily. 03/29/15   Milton Ferguson, MD  aspirin EC 81 MG tablet Take 1 tablet (81 mg total) by mouth daily. 10/13/13   Arnoldo Lenis, MD  Aspirin-Salicylamide-Caffeine [facility] HEADACHE) [phone] MG TABS Take 1 packet by mouth daily as needed (for pain).    Historical Provider, MD  cefdinir (OMNICEF) 300 MG capsule Take 1 capsule (300 mg total) by mouth 2 (two) times daily. 04/02/15   Fransico Meadow, PA-C  HYDROcodone-acetaminophen (NORCO/VICODIN) 5-325 MG tablet Take 1 tablet by mouth every 6 (six) hours as needed. 03/29/15   Milton Ferguson, MD  loratadine (CLARITIN) 10 MG tablet Take 10 mg by mouth daily.    Historical Provider, MD  methocarbamol (ROBAXIN) 500 MG tablet Take 1 tablet (500 mg total) by mouth 2 (two) times daily. 04/02/15   Fransico Meadow, PA-C  oxyCODONE-acetaminophen (PERCOCET/ROXICET) 5-325 MG tablet Take 2 tablets by mouth every 4 (four) hours as needed for severe pain. 04/02/15   Fransico Meadow, PA-C  pseudoephedrine (SUDAFED) 30 MG tablet Take 30 mg by mouth every 4 (four) hours as needed for congestion.    Historical Provider, MD   BP 202/127 mmHg  Pulse 75  Temp(Src) 97.4  F (36.3 C)  Resp 22  Ht 5\' 7"  (1.702 m)  Wt 205 lb (92.987 kg)  BMI 32.10 kg/m2  SpO2 99% Physical Exam  Constitutional: He is oriented to person, place, and time. He appears well-developed and well-nourished. No distress.  Appears mildly dyspneic.   HENT:  Head: Normocephalic and atraumatic.  Moderate edema of turbinates bilaterally.   Eyes: Conjunctivae and EOM are normal. Pupils are equal, round, and reactive to light.  Neck: Normal range of motion. Neck supple. No JVD present.  Cardiovascular: Normal rate, regular rhythm  and normal heart sounds.   Pulmonary/Chest: Effort normal and breath sounds normal. He has no wheezes. He has no rales. He exhibits no tenderness.  Slightly prolonged exhalation phase.  Abdominal: Soft. Bowel sounds are normal. He exhibits no distension and no mass. There is no tenderness.  Musculoskeletal: Normal range of motion. He exhibits edema.  Trace presacral edema.   Lymphadenopathy:    He has no cervical adenopathy.  Neurological: He is alert and oriented to person, place, and time. No cranial nerve deficit. He exhibits normal muscle tone. Coordination normal.  Skin: Skin is warm and dry. No rash noted.  Psychiatric: He has a normal mood and affect. His behavior is normal. Judgment and thought content normal.    ED Course  Procedures (including critical care time) DIAGNOSTIC STUDIES: Oxygen Saturation is 99% on RA, normal by my interpretation.    COORDINATION OF CARE: 12:43 AM-Discussed treatment plan which includes EKG, CXR, BMP, troponin I, and CBC with differential with pt at bedside and pt agreed to plan.   Labs Review Results for orders placed or performed during the hospital encounter of [id number]  Basic metabolic panel  Result Value Ref Range   Sodium 143 135 - 145 mmol/L   Potassium 3.9 3.5 - 5.1 mmol/L   Chloride 109 101 - 111 mmol/L   CO2 26 22 - 32 mmol/L   Glucose, Bld 129 (H) 65 - 99 mg/dL   BUN 20 6 - 20 mg/dL   Creatinine, Ser 1.55 (H) 0.61 - 1.24 mg/dL   Calcium 9.1 8.9 - 10.3 mg/dL   GFR calc non Af Amer 51 (L) >60 mL/min   GFR calc Af Amer 59 (L) >60 mL/min   Anion gap 8 5 - 15  Troponin I  Result Value Ref Range   Troponin I 0.04 (H) <0.031 ng/mL  CBC with Differential  Result Value Ref Range   WBC 10.2 4.0 - 10.5 K/uL   RBC 5.38 4.22 - 5.81 MIL/uL   Hemoglobin 14.5 13.0 - 17.0 g/dL   HCT 42.6 39.0 - 52.0 %   MCV 79.2 78.0 - 100.0 fL   MCH 27.0 26.0 - 34.0 pg   MCHC 34.0 30.0 - 36.0 g/dL   RDW 13.8 11.5 - 15.5 %   Platelets 201 150 - 400  K/uL   Neutrophils Relative % 63 %   Neutro Abs 6.4 1.7 - 7.7 K/uL   Lymphocytes Relative 25 %   Lymphs Abs 2.6 0.7 - 4.0 K/uL   Monocytes Relative 8 %   Monocytes Absolute 0.9 0.1 - 1.0 K/uL   Eosinophils Relative 4 %   Eosinophils Absolute 0.4 0.0 - 0.7 K/uL   Basophils Relative 0 %   Basophils Absolute 0.0 0.0 - 0.1 K/uL   Imaging Review Dg Chest 2 View  06/07/2015  CLINICAL DATA:  Acute onset of shortness of breath and midsternal chest pain. Nasal congestion and headache. Initial encounter. EXAM: CHEST  2 VIEW COMPARISON:  Chest radiograph from 06/28/2014 FINDINGS: The lungs are well-aerated and clear. There is no evidence of focal opacification, pleural effusion or pneumothorax. The heart is normal in size; the mediastinal contour is within normal limits. No acute osseous abnormalities are seen. IMPRESSION: No acute cardiopulmonary process seen. Electronically Signed   By: Garald Balding M.D.   On: 06/07/2015 01:33   I have personally reviewed and evaluated these images and lab results as part of my medical decision-making.   EKG Interpretation   Date/Time:  Wednesday June 07 2015 02:27:32 EDT Ventricular Rate:  69 PR Interval:  170 QRS Duration: 86 QT Interval:  404 QTC Calculation: 433 R Axis:   -35 Text Interpretation:  Sinus rhythm Left ventricular hypertrophy Anterior  infarct, old Abnormal T, consider ischemia, diffuse leads LVH with strain  pattern When compared with ECG of 03/29/2015, No significant change was  found Confirmed by [facility]  MD, Charlei Ramsaran ([id number]) on 06/07/2015 2:32:38 AM      MDM   Final diagnoses:  Hypertensive urgency  Chest pain, unspecified chest pain type  Nasal congestion  Renal insufficiency  Elevated troponin    Dyspnea which seems to be more related to nasal congestion and then intrinsic lung problems. Only finding on pulmonary exam is slightly prolonged exhalation phase. He is noted to be quite hypertensive but does not show signs of  congestive heart failure. There is no neck vein distention and no peripheral edema. Old records were reviewed and he has had persistent hypertension for the last 4 years with very high levels of hypertension over the last 2 years. He is not on any antihypertensive medication. Chest x-ray shows no evidence of congestive heart failure. He is given a therapeutic trial of albuterol with ipratropium with no improvement. Troponin is coming back borderline elevated so he will be treated as possible acute coronary syndrome. He is started on heparin and was given aspirin. Mild renal insufficiency is noted which is unchanged from baseline. ECG shows LVH with strain pattern which is also unchanged from baseline. He is given labetalol for his hypertension. He is also given a dose of Levoxyl metolazone to try to clear his nasal passages. Case is discussed with Dr. Darrick Meigs of [facility] hospitalist who agrees to admit the patient.  I personally performed the services described in this documentation, which was scribed in my presence. The recorded information has been reviewed and is accurate.       Delora Fuel, MD [id number]

## 2015-06-07 NOTE — ED Notes (Signed)
Pt c/o sob, stuffy nose, and headache.

## 2015-06-08 DIAGNOSIS — I10 Essential (primary) hypertension: Secondary | ICD-10-CM | POA: Diagnosis not present

## 2015-06-08 DIAGNOSIS — R079 Chest pain, unspecified: Secondary | ICD-10-CM | POA: Diagnosis not present

## 2015-06-08 DIAGNOSIS — N183 Chronic kidney disease, stage 3 (moderate): Secondary | ICD-10-CM | POA: Diagnosis not present

## 2015-06-08 DIAGNOSIS — E1122 Type 2 diabetes mellitus with diabetic chronic kidney disease: Secondary | ICD-10-CM | POA: Diagnosis not present

## 2015-06-08 DIAGNOSIS — I160 Hypertensive urgency: Secondary | ICD-10-CM | POA: Diagnosis not present

## 2015-06-08 LAB — HEMOGLOBIN A1C
HEMOGLOBIN A1C: 6.6 % — AB (ref 4.8–5.6)
MEAN PLASMA GLUCOSE: 143 mg/dL

## 2015-06-08 LAB — GLUCOSE, CAPILLARY
GLUCOSE-CAPILLARY: 116 mg/dL — AB (ref 65–99)
Glucose-Capillary: 102 mg/dL — ABNORMAL HIGH (ref 65–99)

## 2015-06-08 MED ORDER — AMLODIPINE BESYLATE 5 MG PO TABS
7.5000 mg | ORAL_TABLET | Freq: Every day | ORAL | Status: AC
  Administered 2015-06-08: 7.5 mg via ORAL
  Filled 2015-06-08: qty 2

## 2015-06-08 MED ORDER — AMLODIPINE BESYLATE 5 MG PO TABS: 10.0000 mg | ORAL_TABLET | Freq: Every day | ORAL | Status: DC

## 2015-06-08 MED ORDER — LOSARTAN POTASSIUM 25 MG PO TABS: 25.0000 mg | ORAL_TABLET | Freq: Every day | ORAL | Status: DC

## 2015-06-08 MED ORDER — ALBUTEROL SULFATE HFA 108 (90 BASE) MCG/ACT IN AERS: 2.0000 | INHALATION_SPRAY | Freq: Four times a day (QID) | RESPIRATORY_TRACT | Status: DC | PRN

## 2015-06-08 MED ORDER — METOPROLOL TARTRATE 50 MG PO TABS: 50.0000 mg | ORAL_TABLET | Freq: Two times a day (BID) | ORAL | Status: DC

## 2015-06-08 MED ORDER — AMLODIPINE BESYLATE 5 MG PO TABS
2.5000 mg | ORAL_TABLET | Freq: Every day | ORAL | Status: DC
  Administered 2015-06-08: 2.5 mg via ORAL
  Filled 2015-06-08: qty 1

## 2015-06-08 MED ORDER — LORAZEPAM 0.5 MG PO TABS
0.5000 mg | ORAL_TABLET | Freq: Two times a day (BID) | ORAL | Status: DC | PRN
  Administered 2015-06-08: 0.5 mg via ORAL
  Filled 2015-06-08: qty 1

## 2015-06-08 MED ORDER — AMLODIPINE BESYLATE 5 MG PO TABS: 5.0000 mg | ORAL_TABLET | Freq: Every day | ORAL | Status: DC

## 2015-06-08 MED ORDER — CHLORTHALIDONE 25 MG PO TABS: 12.5000 mg | ORAL_TABLET | Freq: Every day | ORAL | Status: DC

## 2015-06-08 MED ORDER — CHLORTHALIDONE 25 MG PO TABS
12.5000 mg | ORAL_TABLET | Freq: Every day | ORAL | Status: DC
  Administered 2015-06-08: 12.5 mg via ORAL
  Filled 2015-06-08 (×2): qty 0.5
  Filled 2015-06-08: qty 1

## 2015-06-08 MED ORDER — AMLODIPINE BESYLATE 10 MG PO TABS: 10.0000 mg | ORAL_TABLET | Freq: Every day | ORAL | Status: DC

## 2015-06-08 NOTE — Care Management Note (Signed)
Case Management Note  Patient Details  Name: Craig Knight MRN: [id number] Date of Birth: [birth year]  Subjective/Objective:     Spoke with patient who is alert and oriented from home with spouse. Stated that he can afford medications and are able to make thier medical appointments without difficulty. No DME or O2 at home. No CM needs identified.               Action/Plan: Home with self care.   Expected Discharge Date:                  Expected Discharge Plan:  Home/Self Care  In-House Referral:  NA  Discharge planning Services  CM Consult  Post Acute Care Choice:    Choice offered to:     DME Arranged:    DME Agency:     HH Arranged:    HH Agency:     Status of Service:  Completed, signed off  Medicare Important Message Given:    Date Medicare IM Given:    Medicare IM give by:    Date Additional Medicare IM Given:    Additional Medicare Important Message give by:     If discussed at [place] of Stay Meetings, dates discussed:    Additional Comments:  Alvie Heidelberg, RN 06/08/2015, 12:33 PM

## 2015-06-08 NOTE — Discharge Summary (Signed)
Physician Discharge Summary  Craig Knight [id number] DOB: December 30, 1965 DOA: 06/07/2015  PCP: [name], MD  Admit date: 06/07/2015 Discharge date: 06/08/2015  Recommendations for Outpatient Follow-up:  1. F/u atypical chest pain 2. F/u uncontrolled HTN, sratred on new medications 3. Follow-up CKD stage III   Follow-up Information    Go to [name], NP.   Specialties:  Nurse Practitioner, Radiology, Cardiology   Why:  Follow up April 25, 1:50 PM.   Contact information:   [place] [address] [phone]       Follow up with [name], MD. Schedule an appointment as soon as possible for a visit in 2 weeks.   Specialty:  Family Medicine   Contact information:   [address] [place] [address] [phone]      Discharge Diagnoses:  1. Chest pain, atypical 2. Accelerated HTN/HTN urgency 3. DM type 2 4. Chronic diastolic CHF with severe LVG 5. Chronic CKD Stage III 6. Tobacco use disorder.   Discharge Condition: Improved Disposition: Discharge home   Diet recommendation: Heart-healthy   Filed Weights   06/07/15 0033 06/07/15 0400 06/08/15 0500  Weight: 92.987 kg (205 lb) 97.5 kg (214 lb 15.2 oz) 99.8 kg (220 lb 0.3 oz)    History of present illness:  88 yom with past medical history of HTN, asthma, and bronchitis presented with gradual onset of DOE and midsternal sharp chest pain. Also complained of nasal congestion, loose stool, and nausea. In the ED troponin .04 and it was noted that patient is noncompliant with antihypertensives. He has been admitted for further evaluation of chest pain and cardiology consult.   Hospital Course:  Admitted for accelerated HTN secondary to non-compliance and chest pain. ACS ruled, cardiology recommended conservative management, HTN regimen and f/u as an outpatient. Hospitalization was uncomplicated.   Chest pain. Intermittent, noncardiac. ACS ruled out. Cardiology has recommended  conservative management. ECHO as below. EKG sinus rhythm, LVH with strain, no acute changes.  Accelerated hypertension with evidence of end organ damage with severe LVH and CKD. Secondary to noncompliance. Continue on metoprolol 50 BID. Lisinopril 10mg  daily. Improved  DM Type 2. Stable. A1c 6.6. Blood sugars wnl today.  Chronic diastolic CHF with severe LVH. ECHO as below. Appears compensated.  Chronic kidney disease stage III secondary to hypertension. Stable.  Tobacco use disorder.  Consultants:  Cardiology  Procedures:  ECHO Study Conclusions  - Left ventricle: The cavity size was normal. Wall thickness was  increased in a pattern of severe LVH. Systolic function was  normal. The estimated ejection fraction was in the range of 55%  to 60%. Wall motion was normal; there were no regional wall  motion abnormalities. Features are consistent with a pseudonormal  left ventricular filling pattern, with concomitant abnormal  relaxation and increased filling pressure (grade 2 diastolic  dysfunction). Doppler parameters are consistent with high  ventricular filling pressure. - Aortic valve: Valve area (VTI): 4.32 cm^2. Valve area (Vmax):  3.57 cm^2. - Left atrium: The atrium was moderately dilated. - Right atrium: The atrium was mildly dilated. - Technically adequate study.  Antibiotics:  None  Discharge Instructions  Discharge Instructions    Activity as tolerated - No restrictions    Complete by:  As directed      Diet - low sodium heart healthy    Complete by:  As directed      Diet Carb Modified    Complete by:  As directed  Discharge instructions    Complete by:  As directed   Call your physician or seek immediate medical attention for chest pain, shortness of breath, blood sugars greater than 400 or less than 65 or worsening of condition.          Discharge Medication List as of 06/08/2015  2:14 PM    START taking these medications   Details    albuterol (PROVENTIL HFA;VENTOLIN HFA) 108 (90 Base) MCG/ACT inhaler Inhale 2 puffs into the lungs every 6 (six) hours as needed for wheezing or shortness of breath., Starting 06/08/2015, Until Discontinued, Normal    amLODipine (NORVASC) 10 MG tablet Take 1 tablet (10 mg total) by mouth daily., Starting 06/09/2015, Until Discontinued, Normal    chlorthalidone (HYGROTON) 25 MG tablet Take 0.5 tablets (12.5 mg total) by mouth daily., Starting 06/08/2015, Until Discontinued, Normal    losartan (COZAAR) 25 MG tablet Take 1 tablet (25 mg total) by mouth daily., Starting 06/08/2015, Until Discontinued, Normal    metoprolol (LOPRESSOR) 50 MG tablet Take 1 tablet (50 mg total) by mouth 2 (two) times daily., Starting 06/08/2015, Until Discontinued, Normal      CONTINUE these medications which have NOT CHANGED   Details  aspirin EC 81 MG tablet Take 1 tablet (81 mg total) by mouth daily., Starting 10/13/2013, Until Discontinued, OTC    metFORMIN (GLUCOPHAGE) 500 MG tablet Take 500 mg by mouth daily with breakfast., Until Discontinued, Historical Med      STOP taking these medications     Aspirin-Salicylamide-Caffeine ([facility] HEADACHE) 325-95-16 MG TABS      HYDROcodone-acetaminophen (NORCO/VICODIN) 5-325 MG tablet      loratadine (CLARITIN) 10 MG tablet        Allergies  Allergen Reactions  . Dust Mite Extract   . Lisinopril Cough    The results of significant diagnostics from this hospitalization (including imaging, microbiology, ancillary and laboratory) are listed below for reference.    Significant Diagnostic Studies: Dg Chest 2 View  06/07/2015  CLINICAL DATA:  Acute onset of shortness of breath and midsternal chest pain. Nasal congestion and headache. Initial encounter. EXAM: CHEST  2 VIEW COMPARISON:  Chest radiograph from 06/28/2014 FINDINGS: The lungs are well-aerated and clear. There is no evidence of focal opacification, pleural effusion or pneumothorax. The heart is normal in size;  the mediastinal contour is within normal limits. No acute osseous abnormalities are seen. IMPRESSION: No acute cardiopulmonary process seen. Electronically Signed   By: Garald Balding M.D.   On: 06/07/2015 01:33   Dg Knee 1-2 Views Left  06/07/2015  CLINICAL DATA:  Fall off lawnmower at work, pain around patella EXAM: LEFT KNEE - 1-2 VIEW COMPARISON:  None. FINDINGS: Two views of the left knee submitted. No acute fracture or subluxation. Mild prepatellar soft tissue swelling. No joint effusion. IMPRESSION: No acute fracture or subluxation. Mild prepatellar soft tissue swelling. Electronically Signed   By: Lahoma Crocker M.D.   On: 06/07/2015 14:53    Microbiology: Recent Results (from the past 240 hour(s))  MRSA PCR Screening     Status: None   Collection Time: 06/07/15  3:50 AM  Result Value Ref Range Status   MRSA by PCR NEGATIVE NEGATIVE Final    Comment:        The GeneXpert MRSA Assay (FDA approved for NASAL specimens only), is one component of a comprehensive MRSA colonization surveillance program. It is not intended to diagnose MRSA infection nor to guide or monitor treatment for MRSA infections.  Labs: Basic Metabolic Panel:  Recent Labs Lab 06/07/15 0115 06/07/15 0235  NA 143 141  K 3.9 4.1  CL 109 108  CO2 26 27  GLUCOSE 129* 109*  BUN 20 20  CREATININE 1.55* 1.54*  CALCIUM 9.1 9.1   Liver Function Tests:  Recent Labs Lab 06/07/15 0235  AST 14*  ALT 14*  ALKPHOS 85  BILITOT 0.4  PROT 6.4*  ALBUMIN 3.8   CBC:  Recent Labs Lab 06/07/15 0115 06/07/15 0235  WBC 10.2 9.0  NEUTROABS 6.4  --   HGB 14.5 14.3  HCT 42.6 42.4  MCV 79.2 80.2  PLT 201 204   Cardiac Enzymes:  Recent Labs Lab 06/07/15 0115 06/07/15 0225 06/07/15 0816 06/07/15 1440  TROPONINI 0.04* 0.03 0.03 0.03       Recent Labs  06/07/15 0115  BNP 48.0   Principal Problem:   Chest pain Active Problems:   HTN (hypertension)   DM type 2 (diabetes mellitus, type 2)  (HCC)   Hypertensive urgency   CKD (chronic kidney disease), stage III   Pain in the chest   Elevated troponin   Time coordinating discharge: 35 minutes   Signed:  [name], MD [facility] 06/08/2015, 5:28 PM    By signing my name below, I, [name], attest that this documentation has been prepared under the direction and in the presence of Daniel P. Sarajane Jews, MD. Electronically Signed: [name], Scribe.  06/08/2015   I personally performed the services described in this documentation. All medical record entries made by the scribe were at my direction. I have reviewed the chart and agree that the record reflects my personal performance and is accurate and complete. [name], MD

## 2015-06-08 NOTE — Progress Notes (Signed)
Alert and oriented. Vital signs stable. Saline lock removed. Telemetry D/C'ed. Discharge instructions given. Prescriptions given. Patient verbalized understanding of instructions. Left floor via wheelchair with nursing staff and family member.  Discharged home.  [name] 06/08/2015 2:42 PM

## 2015-06-08 NOTE — Progress Notes (Addendum)
PROGRESS NOTE  Craig Knight [id number] DOB: [birth year] DOA: 06/07/2015 PCP: [name], MD  Summary: 63 yom with past medical history of HTN, asthma, and bronchitis presented with gradual onset of DOE and midsternal sharp chest pain. Also complained of nasal congestion, loose stool, and nausea. In the ED troponin .04 and it was noted that patient is noncompliant with antihypertensives. He has been admitted for further evaluation of chest pain and cardiology consult.    Assessment/Plan: 1. Chest pain. Intermittent,  noncardiac.  ACS ruled out. Cardiology has recommended conservative management. ECHO as below. EKG sinus rhythm, LVH with strain, no acute changes. 2. Accelerated hypertension with evidence of end organ damage with severe LVH and CKD. Secondary to noncompliance. Continue on metoprolol 50 BID. Lisinopril 10mg  daily. Improved 3. DM Type 2. Stable. A1c 6.6. Blood sugars wnl today. 4. Chronic diastolic CHF with severe LVH. ECHO as below. Appears compensated. 5. Chronic kidney disease stage III secondary to hypertension. Stable. 6. Tobacco use disorder.    Appears to be doing well, will follow up on cardiology and anticipate dc later today.  ADDENDUM Discussed with Dr. Harl Bowie, ok for d/c today, he will arrange close outpatient f/u. No ischemic testing at this time. Continue losartan 24 mg daily, lopressor 50mg  BID, chlorthalidone 12.5 mg daily, Norvasc 10mg  daily.  Code Status: Full DVT prophylaxis: Heparin Family Communication:  Disposition Plan:   [name], MD  [facility] Direct contact:  --Via amion app OR  --[email or web address]; password [email or web address] and click  [email or web address] contact night coverage as above 06/08/2015, 6:21 AM  LOS: 1 day   Consultants:  Cardiology   Procedures:  ECHO Study Conclusions  - Left ventricle: The cavity size was normal. Wall thickness was  increased in a pattern of severe LVH. Systolic function was  normal. The estimated  ejection fraction was in the range of 55%  to 60%. Wall motion was normal; there were no regional wall  motion abnormalities. Features are consistent with a pseudonormal  left ventricular filling pattern, with concomitant abnormal  relaxation and increased filling pressure (grade 2 diastolic  dysfunction). Doppler parameters are consistent with high  ventricular filling pressure. - Aortic valve: Valve area (VTI): 4.32 cm^2. Valve area (Vmax):  3.57 cm^2. - Left atrium: The atrium was moderately dilated. - Right atrium: The atrium was mildly dilated. - Technically adequate study.  Antibiotics:  None  HPI/Subjective: Had a headache last night. Had a cough, which has resolved. Breathing is fine now, but had some difficulty breathing last night. Complains of some chest pain, but not much. No swelling or pain in legs.  Objective: Filed Vitals:   06/08/15 0400 06/08/15 0430 06/08/15 0500 06/08/15 0530  BP: 151/95 131/116 133/103 173/83  Pulse: 52 51 50 51  Temp:      TempSrc:      Resp: 13 18  12   Height:      Weight:      SpO2: 100% 100% 96% 98%    Intake/Output Summary (Last 24 hours) at 06/08/15 0621 Last data filed at 06/08/15 0500  Gross per 24 hour  Intake 1132.65 ml  Output    350 ml  Net 782.65 ml     Filed Weights   06/07/15 0033 06/07/15 0400  Weight: 92.987 kg (205 lb) 97.5 kg (214 lb 15.2 oz)    Exam:  General:  Appears calm and comfortable Cardiovascular: RRR, no m/r/g. No LE edema.  Telemetry: SR, no arrhythmias  Respiratory: CTA bilaterally, no w/r/r. Normal respiratory  effort.  Musculoskeletal: grossly normal tone BUE/BLE Psychiatric: grossly normal mood and affect, speech fluent and appropriate Neurologic: grossly non-focal.  New data reviewed:  Glucose stable  Echo noted  LDL 106   Scheduled Meds: . aspirin EC  81 mg Oral Daily  . heparin subcutaneous  5,000 Units Subcutaneous 3 times per day  . insulin aspart  0-9 Units  Subcutaneous TID WC  . loratadine  10 mg Oral Daily  . losartan  25 mg Oral Daily  . metoprolol tartrate  50 mg Oral BID  . pneumococcal 23 valent vaccine  0.5 mL Intramuscular Tomorrow-1000   Continuous Infusions: . sodium chloride 10 mL/hr at 06/08/15 0500    Principal Problem:   Chest pain Active Problems:   HTN (hypertension)   DM type 2 (diabetes mellitus, type 2) (HCC)   Hypertensive urgency   CKD (chronic kidney disease), stage III   Pain in the chest   Elevated troponin   By signing my name below, I, [name], attest that this documentation has been prepared under the direction and in the presence of [place]. Sarajane Jews, MD. Electronically Signed: [name], Scribe.  06/08/2015 9:08am  I personally performed the services described in this documentation. All medical record entries made by the scribe were at my direction. I have reviewed the chart and agree that the record reflects my personal performance and is accurate and complete. [name], MD

## 2015-06-08 NOTE — Progress Notes (Addendum)
Primary cardiologist:  Subjective:   No chest pain or SOB overnight   Objective:   Temp:  [97.2 F (36.2 C)-98.2 F (36.8 C)] 98.2 F (36.8 C) (04/13 0733) Pulse Rate:  [50-69] 58 (04/13 0716) Resp:  [12-23] 14 (04/13 0700) BP: (131-181)/(67-152) 159/107 mmHg (04/13 0852) SpO2:  [96 %-100 %] 98 % (04/13 0700) Weight:  [220 lb 0.3 oz (99.8 kg)] 220 lb 0.3 oz (99.8 kg) (04/13 0500) Last BM Date: 06/07/15  Filed Weights   06/07/15 0033 06/07/15 0400 06/08/15 0500  Weight: 205 lb (92.987 kg) 214 lb 15.2 oz (97.5 kg) 220 lb 0.3 oz (99.8 kg)    Intake/Output Summary (Last 24 hours) at 06/08/15 1009 Last data filed at 06/08/15 0600  Gross per 24 hour  Intake  737.5 ml  Output      0 ml  Net  737.5 ml    Telemetry: sinus bradycardia  Exam:  General: NAD  HEENT: sclera clear, throat clear  Resp: CTAB  Cardiac: RRR, no m/r/g, no jvd  GI: abdomen soft, NT, ND  MSK: no LE edema  Neuro: no focal deficits  Psych: appropriate affect  Lab Results:  Basic Metabolic Panel:  Recent Labs Lab 06/07/15 0115 06/07/15 0235  NA 143 141  K 3.9 4.1  CL 109 108  CO2 26 27  GLUCOSE 129* 109*  BUN 20 20  CREATININE 1.55* 1.54*  CALCIUM 9.1 9.1    Liver Function Tests:  Recent Labs Lab 06/07/15 0235  AST 14*  ALT 14*  ALKPHOS 85  BILITOT 0.4  PROT 6.4*  ALBUMIN 3.8    CBC:  Recent Labs Lab 06/07/15 0115 06/07/15 0235  WBC 10.2 9.0  HGB 14.5 14.3  HCT 42.6 42.4  MCV 79.2 80.2  PLT 201 204    Cardiac Enzymes:  Recent Labs Lab 06/07/15 0225 06/07/15 0816 06/07/15 1440  TROPONINI 0.03 0.03 0.03    BNP: No results for input(s): PROBNP in the last 8760 hours.  Coagulation:  Recent Labs Lab 06/07/15 0115  INR 1.01    ECG:   Medications:   Scheduled Medications: . amLODipine  2.5 mg Oral Daily  . aspirin EC  81 mg Oral Daily  . heparin subcutaneous  5,000 Units Subcutaneous 3 times per day  . insulin aspart  0-9 Units  Subcutaneous TID WC  . loratadine  10 mg Oral Daily  . losartan  25 mg Oral Daily  . metoprolol tartrate  50 mg Oral BID  . pneumococcal 23 valent vaccine  0.5 mL Intramuscular Tomorrow-1000     Infusions: . sodium chloride 10 mL/hr at 06/08/15 0600     PRN Medications:  acetaminophen **OR** acetaminophen, HYDROcodone-acetaminophen, labetalol, ondansetron **OR** ondansetron (ZOFRAN) IV     Assessment/Plan   1. Atypical chest pain - 1 week history of intermittent sharp 2/10 right sided chest pain. Worst with movement, some SOB. Would last only a few seconds, did not think much of it.He woke up to go to the bathroom, walked to check on kids as well. On way back to his room had sudden SOB, said his throat and nose suddenly felt clogged up and couldn't get his breath. He Had 6/10 chest pressure that lasted approx 1 hour. Breathing and chest pain improved after breathing treatment.   - no objective evidence of ischemia by EKG or enzymes, he has chronic ST/T changes related to LVH - echo shows normal LVEF, no WMAs, severe LVH - long history of atypical chest pain,  negative nuclear stress 2013 - no further plans for ischemic testing at this time  2. HTN - noncompliant with meds. He has evidence of end organ damage with severe LVH and CKD - currently on losartan 25mg  daily (low dose due to CKD), lopressor 50mg  bid.  - I had started chlorthalidone 10/2013, his Cr bumped up from 1.3 to 1.5 compared to the years prior lab and chlorthalidone was stopped. His Cr has remained around 1.5, I suspect the increase was due to progression of his CKD as opposed to acute change due to diuretic - start norvasc 10mg  daily, start chlorthalidne 12.5mg  daily. Goal bp given his CKD is <130/80 - follow renal function   3. SOB - improved with breathing treatment - patient asking for prn albuterol at discharge - would benefit from outpatient PFTs   Carlyle Dolly, M.D.

## 2015-06-14 ENCOUNTER — Encounter: Payer: Self-pay | Admitting: Family Medicine

## 2015-06-14 ENCOUNTER — Ambulatory Visit (INDEPENDENT_AMBULATORY_CARE_PROVIDER_SITE_OTHER): Payer: BLUE CROSS/BLUE SHIELD | Admitting: Family Medicine

## 2015-06-14 ENCOUNTER — Telehealth: Payer: Self-pay | Admitting: Family Medicine

## 2015-06-14 VITALS — BP 136/84 | Ht 68.0 in | Wt 213.1 lb

## 2015-06-14 DIAGNOSIS — I10 Essential (primary) hypertension: Secondary | ICD-10-CM

## 2015-06-14 DIAGNOSIS — Z720 Tobacco use: Secondary | ICD-10-CM

## 2015-06-14 DIAGNOSIS — N183 Chronic kidney disease, stage 3 unspecified: Secondary | ICD-10-CM

## 2015-06-14 DIAGNOSIS — E119 Type 2 diabetes mellitus without complications: Secondary | ICD-10-CM | POA: Diagnosis not present

## 2015-06-14 MED ORDER — MELOXICAM 15 MG PO TABS: ORAL_TABLET | ORAL | Status: DC

## 2015-06-14 NOTE — Telephone Encounter (Signed)
Med list updated

## 2015-06-14 NOTE — Telephone Encounter (Signed)
Gosh wendy with that possible mistake, we still need to hear directly from family the name of the three meds he is taking, have thme read to you, then put "not taking" unbder the fourth med (presumably the chlorthalidone) so the heart fdoc will know next wk

## 2015-06-14 NOTE — Telephone Encounter (Signed)
The family did call back today. Under my note is the name of the three meds.

## 2015-06-14 NOTE — Telephone Encounter (Signed)
Pt calling back to let you what his meds are  Norvasc 10 mg Cozaar   25 mg  Lopressor 50 mg

## 2015-06-14 NOTE — Telephone Encounter (Signed)
i gotya

## 2015-06-14 NOTE — Progress Notes (Signed)
   Subjective:    Patient ID: Craig Knight, male    DOB: [birth year], 50 y.o.   MRN: [id number] Patient arrives office as a new patient with numerous complicated concerns next  Review of his record shows that he has been to the emergency room quite a lot, does have a cardiologist, but has no regular primary care doctor  Patient has a history of diabetes. He takes metformin just one per day. Has not been checking his sugars at all. A1c finally done at most recent hospitalization and was decent at 6.6. There is some family history diabetes.  Patient admits to noncompliance as far as blood pressure management. Income off his medication. She numbers from the numbers were quite high. Echocardiogram revealed significant left ventricular hypertrophy. Patient was given prescription for for medicines but is taking only 3. See medication list  Patient unfortunate he smokes. History of wheezing. Prescribed an inhaler. Could not get it filled out because the high $50 co-pay   Chest Pain  This is a new problem. The current episode started in the past 7 days. The problem occurs intermittently. The problem has been unchanged. Associated symptoms include headaches.   Patient in today for a hospital follow up for chest pain (see ER Note) Patient states chest pain has not resolved.  Six cigarettes per day  Patient also has concerns of left t knee pain from fall.   Review of Systems  Cardiovascular: Positive for chest pain.  Neurological: Positive for headaches.   no abdominal pain no change in bowel habits no blood in stool no weight loss no weight gain no rash     Objective:   Physical Exam Alert vital stable blood pressure decent on repeat HEENT normal lungs trace wheeze with deep breaths heart regular rate and rhythm. Blood pressure 136/80 4 repeat. Abdomen benign. Ankles trace edema left leg good range of motion some tenderness along lateral patella x-ray reviewed once can and negative         Assessment & Plan:  Impression 1 history of borderline compliance frankly discussed with patient today #2 hypertension good control back on meds. Concerning with patient's left ventricular hypertrophy discussed #3 type 2 diabetes A1c decent. Diet so so. #4 reactive airways question COPD element #5 chronic anxiety patient brings [place] up attended the visit #6 left knee contusion still painful x-ray negative plan trial of Modic. #7 chronic renal disease discussed at length. #8 chronic smoking discussed plan encouraged to stop smoking immediately. Importance of risk factor control and keeping kidneys healthy also discussed. Education information for session at the hospital given and encouraged to do. Glucometer prescribed. Patient maintain all blood pressure meds. Hold off on the one he is not taking currently pending assessment with cardiologist next week and reassessment with [place] next month diet exercise discussed 45 minutes spent most in discussion

## 2015-06-14 NOTE — Telephone Encounter (Signed)
Seen today. Called pharm. Pharm states he filled all three of those meds on 4/13 and chlorthalidone 25mg  one half qd. Pt's wife states the pharm only gave her three meds that day. Pt states he does not take a med that he has to cut in half. Pt was advised at his office visit to continue the same as he was taking. Do you want to take chlorthalidone  Off of med list.

## 2015-06-20 ENCOUNTER — Encounter: Payer: Self-pay | Admitting: *Deleted

## 2015-06-20 ENCOUNTER — Encounter: Payer: Self-pay | Admitting: Adult Health

## 2015-06-20 ENCOUNTER — Ambulatory Visit (INDEPENDENT_AMBULATORY_CARE_PROVIDER_SITE_OTHER): Payer: BLUE CROSS/BLUE SHIELD | Admitting: Adult Health

## 2015-06-20 VITALS — BP 150/94 | HR 67 | Ht 68.0 in | Wt 215.0 lb

## 2015-06-20 DIAGNOSIS — I10 Essential (primary) hypertension: Secondary | ICD-10-CM

## 2015-06-20 MED ORDER — CHLORTHALIDONE 25 MG PO TABS: 25.0000 mg | ORAL_TABLET | Freq: Every day | ORAL | Status: DC

## 2015-06-20 NOTE — Progress Notes (Signed)
Cardiology Office Note   Date:  06/20/2015   ID:  Craig Knight, DOB February 20, 1966, MRN [id number]  PCP:  [name], MD  Cardiologist: Cloria Spring, NP   Chief Complaint  Patient presents with  . Hypertension      History of Present Illness: Craig Knight is a 50 y.o. male who presents for ongoing assessment and management of hypertension, chest pain, chronic dyspnea on exertion. He was recently admitted to the hospital on 06/07/2015 with cough, uncontrolled hypertension,and atypical chest pain. Blood pressure was normalized and he was followed up with an echocardiogram.  Echo:  - Left ventricle: The cavity size was normal. Wall thickness was  increased in a pattern of severe LVH. Systolic function was  normal. The estimated ejection fraction was in the range of 55%  to 60%. Wall motion was normal; there were no regional wall  motion abnormalities. Features are consistent with a pseudonormal  left ventricular filling pattern, with concomitant abnormal  relaxation and increased filling pressure (grade 2 diastolic  dysfunction). Doppler parameters are consistent with high  ventricular filling pressure. - Aortic valve: Valve area (VTI): 4.32 cm^2. Valve area (Vmax):  3.57 cm^2. - Left atrium: The atrium was moderately dilated. - Right atrium: The atrium was mildly dilated. - Technically adequate study.  He is discharged on amlodipine 10 mg daily, chlorthalidone 12. 5 mg daily losartan 25 mg daily, metoprolol 50 mg twice a day. He was taken off of BC powders. Seen Dr.Luking since hospitalization discharge and is doing better.he continues to trouble with salty foods. He is also has been diagnosed with sleep apnea but has not obtained a CPAP machine yet. He states that he cannot afford one.    Past Medical History  Diagnosis Date  . Hypertension   . Asthma   . Bronchitis     Past Surgical History  Procedure Laterality Date  . Lipoma removal        Current Outpatient Prescriptions  Medication Sig Dispense Refill  . albuterol (PROVENTIL HFA;VENTOLIN HFA) 108 (90 Base) MCG/ACT inhaler Inhale 2 puffs into the lungs every 6 (six) hours as needed for wheezing or shortness of breath. 1 Inhaler 0  . amLODipine (NORVASC) 10 MG tablet Take 1 tablet (10 mg total) by mouth daily. 30 tablet 0  . aspirin EC 81 MG tablet Take 1 tablet (81 mg total) by mouth daily. (Patient taking differently: Take 81 mg by mouth 2 (two) times daily. )    . losartan (COZAAR) 25 MG tablet Take 1 tablet (25 mg total) by mouth daily. 30 tablet 0  . meloxicam (MOBIC) 15 MG tablet Take 1 tablet by mouth daily as needed for knee pain 30 tablet 0  . metFORMIN (GLUCOPHAGE) 500 MG tablet Take 500 mg by mouth daily with breakfast.    . metoprolol (LOPRESSOR) 50 MG tablet Take 1 tablet (50 mg total) by mouth 2 (two) times daily. 60 tablet 0  . chlorthalidone (HYGROTON) 25 MG tablet Take 1 tablet (25 mg total) by mouth daily. 90 tablet 3   No current facility-administered medications for this visit.    Allergies:   Dust mite extract and Lisinopril    Social History:  The patient  reports that he has been smoking Cigarettes.  He has a 12.5 pack-year smoking history. He does not have any smokeless tobacco history on file. He reports that he drinks alcohol. He reports that he does not use illicit drugs.   Family History:  The patient's  family history includes Heart attack (age of onset: 53) in his father; Hypertension in his brother and sister; Stroke in his brother and mother.    ROS: All other systems are reviewed and negative. Unless otherwise mentioned in H&P    PHYSICAL EXAM: VS:  BP 150/94 mmHg  Pulse 67  Ht 5\' 8"  (1.727 m)  Wt 215 lb (97.523 kg)  BMI 32.70 kg/m2  SpO2 95% , BMI Body mass index is 32.7 kg/(m^2). GEN: Well nourished, well developed, in no acute distress HEENT: normal Neck: no JVD, carotid bruits, or masses Cardiac: RRR; no murmurs, rubs, or  gallops,no edema  Respiratory:  clear to auscultation bilaterally, normal work of breathing GI: soft, nontender, nondistended, + BS MS: no deformity or atrophy Skin: warm and dry, no rash Neuro:  Strength and sensation are intact Psych: euthymic mood, full affect   Recent Labs: 06/07/2015: ALT 14*; B Natriuretic Peptide 48.0; BUN 20; Creatinine, Ser 1.54*; Hemoglobin 14.3; Platelets 204; Potassium 4.1; Sodium 141    Lipid Panel    Component Value Date/Time   CHOL 157 06/07/2015 0815   TRIG 85 06/07/2015 0815   HDL 34* 06/07/2015 0815   CHOLHDL 4.6 06/07/2015 0815   VLDL 17 06/07/2015 0815   LDLCALC 106* 06/07/2015 0815      Wt Readings from Last 3 Encounters:  06/20/15 215 lb (97.523 kg)  06/14/15 213 lb 2 oz (96.673 kg)  06/08/15 220 lb 0.3 oz (99.8 kg)       ASSESSMENT AND PLAN:  1. Hypertension:blood pressure is improved but not optimal. He continues to struggle with salty foods. I would increase his chlorthalidone from 12.5 mg daily to 25 mg daily. He'll continue amlodipine, metoprolol and losartan as directed.we'll see him again in a month's period he will need a BMET at that time.  2. History of obstructive sleep apnea:the patient has been diagnosed but has not obtained a CPAP machine as he states he cannot afford it. He does have insurance and would readdress this. He is going to talk about this with his primary care physician on followup visit in 2 weeks.  3. Obesity: I advised the patient to begin a walking regimen. He used to be very active walking until his blood pressure increased and he started a new job. I've advised him to walk 15 minutes a day and began to work its way up as he has time.he states he enjoyed walking that has gotten out of the habit since his new job has started. His family members were within stated that they would walk with him. I hope that he begins this new regimen again.   Current medicines are reviewed at length with the patient today.     Labs/ tests ordered today include:   Orders Placed This Encounter  Procedures  . Basic Metabolic Panel (BMET)     Disposition:   FU with 1 month Signed, [name], NP  06/20/2015 3:50 PM    [facility] [address], [place], [place] [address] Phone: [phone]; Fax: [phone]

## 2015-06-20 NOTE — Progress Notes (Deleted)
Name: Craig Knight    DOB: 1966/01/15  Age: 50 y.o.  MR#: [id number]       PCP:  [name], MD      Insurance: Payor: [place] / Plan: [place] / Product Type: *No Product type* /   CC:   No chief complaint on file.   VS Filed Vitals:   06/20/15 1354  BP: 150/94  Pulse: 67  Height: 5\' 8"  (1.727 m)  Weight: 215 lb (97.523 kg)  SpO2: 95%    Weights Current Weight  06/20/15 215 lb (97.523 kg)  06/14/15 213 lb 2 oz (96.673 kg)  06/08/15 220 lb 0.3 oz (99.8 kg)    Blood Pressure  BP Readings from Last 3 Encounters:  06/20/15 150/94  06/14/15 136/84  06/08/15 159/107     Admit date:  (Not on file) Last encounter with RMR:  Visit date not found   Allergy Dust mite extract and Lisinopril  Current Outpatient Prescriptions  Medication Sig Dispense Refill  . albuterol (PROVENTIL HFA;VENTOLIN HFA) 108 (90 Base) MCG/ACT inhaler Inhale 2 puffs into the lungs every 6 (six) hours as needed for wheezing or shortness of breath. 1 Inhaler 0  . amLODipine (NORVASC) 10 MG tablet Take 1 tablet (10 mg total) by mouth daily. 30 tablet 0  . aspirin EC 81 MG tablet Take 1 tablet (81 mg total) by mouth daily. (Patient taking differently: Take 81 mg by mouth 2 (two) times daily. )    . chlorthalidone (HYGROTON) 25 MG tablet Take 0.5 tablets (12.5 mg total) by mouth daily. 30 tablet 0  . losartan (COZAAR) 25 MG tablet Take 1 tablet (25 mg total) by mouth daily. 30 tablet 0  . meloxicam (MOBIC) 15 MG tablet Take 1 tablet by mouth daily as needed for knee pain 30 tablet 0  . metFORMIN (GLUCOPHAGE) 500 MG tablet Take 500 mg by mouth daily with breakfast.    . metoprolol (LOPRESSOR) 50 MG tablet Take 1 tablet (50 mg total) by mouth 2 (two) times daily. 60 tablet 0   No current facility-administered medications for this visit.    Discontinued Meds:   There are no discontinued medications.  Patient Active Problem List   Diagnosis Date Noted  . Hypertensive urgency 06/07/2015   . CKD (chronic kidney disease), stage III 06/07/2015  . Pain in the chest   . Elevated troponin   . Chronic diastolic CHF (congestive heart failure) ([place]) 09/17/2013  . DM type 2 (diabetes mellitus, type 2) ([place]) 09/15/2013  . Chest pain 08/01/2011  . HTN (hypertension) 08/01/2011  . Chest pain 07/22/2011  . Renal insufficiency 07/22/2011  . Tobacco abuse 07/22/2011  . Hypertension     LABS    Component Value Date/Time   NA 141 06/07/2015 0235   NA 143 06/07/2015 0115   NA 140 06/28/2014 0905   K 4.1 06/07/2015 0235   K 3.9 06/07/2015 0115   K 3.8 06/28/2014 0905   CL 108 06/07/2015 0235   CL 109 06/07/2015 0115   CL 106 06/28/2014 0905   CO2 27 06/07/2015 0235   CO2 26 06/07/2015 0115   CO2 27 06/28/2014 0905   GLUCOSE 109* 06/07/2015 0235   GLUCOSE 129* 06/07/2015 0115   GLUCOSE 162* 06/28/2014 0905   BUN 20 06/07/2015 0235   BUN 20 06/07/2015 0115   BUN 20 06/28/2014 0905   CREATININE 1.54* 06/07/2015 0235   CREATININE 1.55* 06/07/2015 0115   CREATININE 1.30* 06/28/2014 [id number]  CREATININE 1.56* 11/11/2013 1437   CALCIUM 9.1 06/07/2015 0235   CALCIUM 9.1 06/07/2015 0115   CALCIUM 9.1 06/28/2014 0905   GFRNONAA 51* 06/07/2015 0235   GFRNONAA 51* 06/07/2015 0115   GFRNONAA >60 06/28/2014 0905   GFRAA 59* 06/07/2015 0235   GFRAA 59* 06/07/2015 0115   GFRAA >60 06/28/2014 0905   CMP     Component Value Date/Time   NA 141 06/07/2015 0235   K 4.1 06/07/2015 0235   CL 108 06/07/2015 0235   CO2 27 06/07/2015 0235   GLUCOSE 109* 06/07/2015 0235   BUN 20 06/07/2015 0235   CREATININE 1.54* 06/07/2015 0235   CREATININE 1.56* 11/11/2013 1437   CALCIUM 9.1 06/07/2015 0235   PROT 6.4* 06/07/2015 0235   ALBUMIN 3.8 06/07/2015 0235   AST 14* 06/07/2015 0235   ALT 14* 06/07/2015 0235   ALKPHOS 85 06/07/2015 0235   BILITOT 0.4 06/07/2015 0235   GFRNONAA 51* 06/07/2015 0235   GFRAA 59* 06/07/2015 0235       Component Value Date/Time   WBC 9.0 06/07/2015 0235    WBC 10.2 06/07/2015 0115   WBC 8.8 06/28/2014 0905   HGB 14.3 06/07/2015 0235   HGB 14.5 06/07/2015 0115   HGB 15.1 06/28/2014 0905   HCT 42.4 06/07/2015 0235   HCT 42.6 06/07/2015 0115   HCT 45.1 06/28/2014 0905   MCV 80.2 06/07/2015 0235   MCV 79.2 06/07/2015 0115   MCV 80.7 06/28/2014 0905    Lipid Panel     Component Value Date/Time   CHOL 157 06/07/2015 0815   TRIG 85 06/07/2015 0815   HDL 34* 06/07/2015 0815   CHOLHDL 4.6 06/07/2015 0815   VLDL 17 06/07/2015 0815   LDLCALC 106* 06/07/2015 0815    ABG No results found for: PHART, PCO2ART, PO2ART, HCO3, TCO2, ACIDBASEDEF, O2SAT   Lab Results  Component Value Date   TSH 1.790 09/16/2013   BNP (last 3 results)  Recent Labs  06/07/15 0115  BNP 48.0    ProBNP (last 3 results) No results for input(s): PROBNP in the last 8760 hours.  Cardiac Panel (last 3 results) No results for input(s): CKTOTAL, CKMB, TROPONINI, RELINDX in the last 72 hours.  Iron/TIBC/Ferritin/ %Sat No results found for: IRON, TIBC, FERRITIN, IRONPCTSAT   EKG Orders placed or performed during the hospital encounter of 06/07/15  . ED EKG  . ED EKG  . EKG 12-Lead  . EKG 12-Lead  . EKG     Prior Assessment and Plan Problem List as of 06/20/2015      Cardiovascular and Mediastinum   Hypertension   HTN (hypertension)   Chronic diastolic CHF (congestive heart failure) ([place])   Hypertensive urgency     Endocrine   DM type 2 (diabetes mellitus, type 2) ([place])     Genitourinary   Renal insufficiency   CKD (chronic kidney disease), stage III     Other   Chest pain   Tobacco abuse   Chest pain   Pain in the chest   Elevated troponin       Imaging: Dg Chest 2 View  06/07/2015  CLINICAL DATA:  Acute onset of shortness of breath and midsternal chest pain. Nasal congestion and headache. Initial encounter. EXAM: CHEST  2 VIEW COMPARISON:  Chest radiograph from 06/28/2014 FINDINGS: The lungs are well-aerated and clear. There is no  evidence of focal opacification, pleural effusion or pneumothorax. The heart is normal in size; the mediastinal contour is within normal limits. No acute  osseous abnormalities are seen. IMPRESSION: No acute cardiopulmonary process seen. Electronically Signed   By: Garald Balding M.D.   On: 06/07/2015 01:33   Dg Knee 1-2 Views Left  06/07/2015  CLINICAL DATA:  Fall off lawnmower at work, pain around patella EXAM: LEFT KNEE - 1-2 VIEW COMPARISON:  None. FINDINGS: Two views of the left knee submitted. No acute fracture or subluxation. Mild prepatellar soft tissue swelling. No joint effusion. IMPRESSION: No acute fracture or subluxation. Mild prepatellar soft tissue swelling. Electronically Signed   By: Lahoma Crocker M.D.   On: 06/07/2015 14:53

## 2015-06-20 NOTE — Patient Instructions (Addendum)
Your physician recommends that you schedule a follow-up appointment in: 1 Month with [name], NP.   Your physician has recommended you make the following change in your medication:  Increase Chlorthalidone to 25 mg Daily  Your physician recommends that you schedule a follow-up appointment in: 1 weeks. (BMET)   Please Follow-Up with Dr. Wolfgang Phoenix for CPAP  If you need a refill on your cardiac medications before your next appointment, please call your pharmacy.  Thank you for choosing [facility]!

## 2015-06-21 ENCOUNTER — Telehealth: Payer: Self-pay | Admitting: Family Medicine

## 2015-06-21 NOTE — Telephone Encounter (Signed)
Please clarify  There is a referral in system for pt for nurtition & diabetes education OV note states that info was given to pt for education session at hospital & was encouraged to go  Do I need to process referral??  Please advise

## 2015-06-21 NOTE — Telephone Encounter (Signed)
No, just educ session is fine

## 2015-07-17 ENCOUNTER — Ambulatory Visit: Payer: BLUE CROSS/BLUE SHIELD | Admitting: Family Medicine

## 2015-07-20 ENCOUNTER — Encounter: Payer: BLUE CROSS/BLUE SHIELD | Admitting: Adult Health

## 2015-07-20 NOTE — Progress Notes (Signed)
  ERROR  Cancelled APPT

## 2015-07-25 ENCOUNTER — Ambulatory Visit (INDEPENDENT_AMBULATORY_CARE_PROVIDER_SITE_OTHER): Payer: BLUE CROSS/BLUE SHIELD | Admitting: Family Medicine

## 2015-07-25 ENCOUNTER — Encounter: Payer: Self-pay | Admitting: Family Medicine

## 2015-07-25 VITALS — BP 140/98 | Ht 67.0 in | Wt 214.0 lb

## 2015-07-25 DIAGNOSIS — J4520 Mild intermittent asthma, uncomplicated: Secondary | ICD-10-CM | POA: Diagnosis not present

## 2015-07-25 DIAGNOSIS — I10 Essential (primary) hypertension: Secondary | ICD-10-CM

## 2015-07-25 DIAGNOSIS — E119 Type 2 diabetes mellitus without complications: Secondary | ICD-10-CM | POA: Diagnosis not present

## 2015-07-25 DIAGNOSIS — R0789 Other chest pain: Secondary | ICD-10-CM | POA: Diagnosis not present

## 2015-07-25 NOTE — Progress Notes (Signed)
   Subjective:    Patient ID: Craig Knight, male    DOB: May 09, 1965, 50 y.o.   MRN: [id number] Patient arrives office for follow-up of numerous concerns. Diabetes He presents for his follow-up diabetic visit. He has type 2 diabetes mellitus. Current diabetic treatment includes oral agent (monotherapy). He is compliant with treatment all of the time. He is following a diabetic diet. Exercise: exercises 4 -5 days a week. His breakfast blood glucose range is generally 90-110 mg/dl.  A1C done in hospital on 06/07/15. 6.6.  Painful knot on chest. Came up a few weeks ago.sternal region. Tender bump in midsternum. Recalls no injuries.   Sees cardiologist this week on Thursday.   Morn glu 75 to 90   Metformin once daily has managed   Using vent once per d or so, sonmetimes more, no smoking for two days. Uses inhaler nearly every day. Occasional cough generally nonproductive.  Claims compliance with blood pressure medication. Not missing a dose currently. Notes numbers overall are much improved when he checks blood pressures.   Working hard on compliance   Review of Systems No headache, no major weight loss or weight gain, no chest pain no back pain abdominal pain no change in bowel habits complete ROS otherwise negative     Objective:   Physical Exam Alert vital stable blood pressure repeat 136/92. HEENT normal lungs clear. Heart regular in rhythm. Sternal region nodular prominence midsternum  Review shows chest x-ray with no mediastinal or bony abnormality 6 weeks ago   Diabetic foot exam normal    Assessment & Plan:  Impression 1 type 2 diabetes control apparently good. 2 early for a second A1c #2 hypertension suboptimal in. Due to see cardiologist soon. Will not change meds today but cardiologist may. Discussed. #3 asthma/reactive airways. With nearly daily use of inhaler. Patient reluctant to get on preventive agent due to substantial costs. Encouraged to stay away from the  smoking long-term. #4 sternal nodular tenderness to deep palpation likely within normal limits as far as shape, patient states he may have well have injured plan maintain same meds. Recheck in several months. Diet exercise discussed compliance discussed A1c in 3 months WSL

## 2015-07-27 ENCOUNTER — Encounter: Payer: Self-pay | Admitting: Adult Health

## 2015-07-27 ENCOUNTER — Encounter: Payer: BLUE CROSS/BLUE SHIELD | Admitting: Adult Health

## 2015-07-27 NOTE — Progress Notes (Signed)
ERROR No Show  

## 2015-10-25 ENCOUNTER — Ambulatory Visit (INDEPENDENT_AMBULATORY_CARE_PROVIDER_SITE_OTHER): Payer: BLUE CROSS/BLUE SHIELD | Admitting: Family Medicine

## 2015-10-25 ENCOUNTER — Encounter: Payer: Self-pay | Admitting: Family Medicine

## 2015-10-25 VITALS — BP 120/74 | Ht 67.0 in | Wt 215.0 lb

## 2015-10-25 DIAGNOSIS — E119 Type 2 diabetes mellitus without complications: Secondary | ICD-10-CM | POA: Diagnosis not present

## 2015-10-25 DIAGNOSIS — I10 Essential (primary) hypertension: Secondary | ICD-10-CM | POA: Diagnosis not present

## 2015-10-25 DIAGNOSIS — R21 Rash and other nonspecific skin eruption: Secondary | ICD-10-CM

## 2015-10-25 LAB — POCT GLYCOSYLATED HEMOGLOBIN (HGB A1C): HEMOGLOBIN A1C: 6

## 2015-10-25 MED ORDER — TRIAMCINOLONE ACETONIDE 0.1 % EX CREA: 1.0000 "application " | TOPICAL_CREAM | Freq: Two times a day (BID) | CUTANEOUS | 3 refills | Status: DC

## 2015-10-25 MED ORDER — CHLORTHALIDONE 25 MG PO TABS: 25.0000 mg | ORAL_TABLET | Freq: Every day | ORAL | 1 refills | Status: DC

## 2015-10-25 MED ORDER — LOSARTAN POTASSIUM 25 MG PO TABS: 25.0000 mg | ORAL_TABLET | Freq: Every day | ORAL | 5 refills | Status: DC

## 2015-10-25 MED ORDER — METFORMIN HCL 500 MG PO TABS: 500.0000 mg | ORAL_TABLET | Freq: Every day | ORAL | 5 refills | Status: DC

## 2015-10-25 MED ORDER — METOPROLOL TARTRATE 50 MG PO TABS: 50.0000 mg | ORAL_TABLET | Freq: Two times a day (BID) | ORAL | 5 refills | Status: DC

## 2015-10-25 NOTE — Progress Notes (Signed)
   Subjective:    Patient ID: Craig Knight, male    DOB: [birth year], 50 y.o.   MRN: [id number]  Diabetes  He presents for his follow-up diabetic visit. He has type 2 diabetes mellitus. He is compliant with treatment all of the time. He is following a diabetic diet. His breakfast blood glucose range is generally 90-110 mg/dl. He does not see a podiatrist.Eye exam is not current.  A1C 6.0.  Itchy rash on chest and stomach.Didn't get out around some sugars and other exposures lately. Next  Still notes the bump in the front part of the chest. Where one rib comes into the sternum  Results for orders placed or performed in visit on 10/25/15  POCT glycosylated hemoglobin (Hb A1C)  Result Value Ref Range   Hemoglobin A1C 6.0      Still sob with exertion, still wheezing on occas. Still smoking  Results for orders placed or performed in visit on 10/25/15  POCT glycosylated hemoglobin (Hb A1C)  Result Value Ref Range   Hemoglobin A1C 6.0     Blood pressure medicine and blood pressure levels reviewed today with patient. Compliant with blood pressure medicine. States does not miss a dose. No obvious side effects. Blood pressure generally good when checked elsewhere. Watching salt intake.  Patient notes headaches. Off-and-on. Diffuse frontal sometimes radiating to nuchal. History of these for many years worse lately. No nocturnal pain.   Review of Systems No headache, no major weight loss or weight gain, no chest pain no back pain abdominal pain no change in bowel habits complete ROS otherwise negative     Objective:   Physical Exam  Alert vitals stable, NAD. Blood pressure good on repeat. HEENT normal. Lungs clear. Heart regular rate and rhythm. Patchy erythematous hypertrophic rash neck upper torso. Palpable swelling at rib sternal junction. X-ray review shows this area normal just a few months ago on x-ray.  Diabetic foot exam within normal limits      Assessment & Plan:    Impression 1 type 2 diabetes good control discussed maintain same meds #2 hypertension good control discussed maintain same #3 rash will try triamcinolone cream when necessary #4 dyspnea likely element of COPD. Patient longtime smoker. Declines pulmonary workup at this time. He will work on cutting down smoking. Follow-up as scheduled

## 2015-10-25 NOTE — Progress Notes (Signed)
   Subjective:    Patient ID: Craig Knight, male    DOB: [birth year], 50 y.o.   MRN: [id number]  HPI    Review of Systems     Objective:   Physical Exam        Assessment & Plan:

## 2016-01-15 ENCOUNTER — Ambulatory Visit: Payer: BLUE CROSS/BLUE SHIELD | Admitting: Family Medicine

## 2016-02-16 ENCOUNTER — Observation Stay (HOSPITAL_BASED_OUTPATIENT_CLINIC_OR_DEPARTMENT_OTHER): Payer: BLUE CROSS/BLUE SHIELD

## 2016-02-16 ENCOUNTER — Emergency Department (HOSPITAL_COMMUNITY): Payer: BLUE CROSS/BLUE SHIELD

## 2016-02-16 ENCOUNTER — Encounter (HOSPITAL_COMMUNITY): Payer: Self-pay | Admitting: Emergency Medicine

## 2016-02-16 ENCOUNTER — Observation Stay (HOSPITAL_COMMUNITY)
Admission: EM | Admit: 2016-02-16 | Discharge: 2016-02-17 | Disposition: A | Discharge status: Home / Self Care | Payer: BLUE CROSS/BLUE SHIELD | Attending: Internal Medicine | Admitting: Internal Medicine

## 2016-02-16 DIAGNOSIS — Z7984 Long term (current) use of oral hypoglycemic drugs: Secondary | ICD-10-CM | POA: Insufficient documentation

## 2016-02-16 DIAGNOSIS — N183 Chronic kidney disease, stage 3 (moderate): Secondary | ICD-10-CM | POA: Diagnosis not present

## 2016-02-16 DIAGNOSIS — J45909 Unspecified asthma, uncomplicated: Secondary | ICD-10-CM | POA: Diagnosis not present

## 2016-02-16 DIAGNOSIS — R05 Cough: Secondary | ICD-10-CM | POA: Insufficient documentation

## 2016-02-16 DIAGNOSIS — Z720 Tobacco use: Secondary | ICD-10-CM | POA: Diagnosis present

## 2016-02-16 DIAGNOSIS — J029 Acute pharyngitis, unspecified: Secondary | ICD-10-CM | POA: Insufficient documentation

## 2016-02-16 DIAGNOSIS — Z7982 Long term (current) use of aspirin: Secondary | ICD-10-CM | POA: Diagnosis not present

## 2016-02-16 DIAGNOSIS — E119 Type 2 diabetes mellitus without complications: Secondary | ICD-10-CM

## 2016-02-16 DIAGNOSIS — I130 Hypertensive heart and chronic kidney disease with heart failure and stage 1 through stage 4 chronic kidney disease, or unspecified chronic kidney disease: Secondary | ICD-10-CM | POA: Insufficient documentation

## 2016-02-16 DIAGNOSIS — E1122 Type 2 diabetes mellitus with diabetic chronic kidney disease: Secondary | ICD-10-CM | POA: Diagnosis not present

## 2016-02-16 DIAGNOSIS — I5032 Chronic diastolic (congestive) heart failure: Secondary | ICD-10-CM | POA: Diagnosis present

## 2016-02-16 DIAGNOSIS — Z79899 Other long term (current) drug therapy: Secondary | ICD-10-CM | POA: Insufficient documentation

## 2016-02-16 DIAGNOSIS — F17210 Nicotine dependence, cigarettes, uncomplicated: Secondary | ICD-10-CM | POA: Diagnosis not present

## 2016-02-16 DIAGNOSIS — I160 Hypertensive urgency: Secondary | ICD-10-CM | POA: Diagnosis not present

## 2016-02-16 DIAGNOSIS — I509 Heart failure, unspecified: Secondary | ICD-10-CM

## 2016-02-16 DIAGNOSIS — I161 Hypertensive emergency: Secondary | ICD-10-CM | POA: Diagnosis present

## 2016-02-16 DIAGNOSIS — R600 Localized edema: Secondary | ICD-10-CM | POA: Diagnosis not present

## 2016-02-16 DIAGNOSIS — R079 Chest pain, unspecified: Secondary | ICD-10-CM | POA: Diagnosis present

## 2016-02-16 LAB — ECHOCARDIOGRAM COMPLETE
AO mean calculated velocity dopler: 80.3 cm/s
AOPV: 0.87 m/s
AOVTI: 27.9 cm
AV Area VTI index: 1.45 cm2/m2
AV Area VTI: 3.6 cm2
AV Area mean vel: 4.04 cm2
AV Mean grad: 4 mmHg
AV peak Index: 1.64
AV pk vel: 144 cm/s
AV vel: 3.17
AVA: 3.17 cm2
AVAREAMEANVIN: 1.84 cm2/m2
AVCELMEANRAT: 0.97
AVPG: 8 mmHg
CHL CUP DOP CALC LVOT VTI: 21.3 cm
CHL CUP STROKE VOLUME: 62 mL
E decel time: 229 msec
E/e' ratio: 21.94
FS: 34 % (ref 28–44)
Height: 67 in
IVS/LV PW RATIO, ED: 1
LA ID, A-P, ES: 36 mm
LA diam end sys: 36 mm
LA vol index: 27.7 mL/m2
LA vol: 60.7 mL
LADIAMINDEX: 1.64 cm/m2
LAVOLA4C: 75.7 mL
LDCA: 4.15 cm2
LV PW d: 20.8 mm — AB (ref 0.6–1.1)
LV SIMPSON'S DISK: 60
LV TDI E'LATERAL: 3.92
LV TDI E'MEDIAL: 4.68
LV dias vol index: 48 mL/m2
LVDIAVOL: 104 mL (ref 62–150)
LVEEAVG: 21.94
LVEEMED: 21.94
LVELAT: 3.92 cm/s
LVOT SV: 88 mL
LVOT diameter: 23 mm
LVOT peak grad rest: 6 mmHg
LVOT peak vel: 125 cm/s
LVOTVTI: 0.76 cm
LVSYSVOL: 42 mL (ref 21–61)
LVSYSVOLIN: 19 mL/m2
Lateral S' vel: 16.5 cm/s
MV Dec: 229
MV Peak grad: 3 mmHg
MVPKAVEL: 85.7 m/s
MVPKEVEL: 86 m/s
TAPSE: 27 mm
Valve area index: 1.45
Weight: 3457.6 oz

## 2016-02-16 LAB — TROPONIN I
TROPONIN I: 0.05 ng/mL — AB (ref <0.03)
Troponin I: 0.06 ng/mL (ref <0.03)
Troponin I: 0.05 ng/mL (ref <0.03)

## 2016-02-16 LAB — BASIC METABOLIC PANEL
ANION GAP: 6 (ref 5–15)
BUN: 22 mg/dL — AB (ref 6–20)
CHLORIDE: 107 mmol/L (ref 101–111)
CO2: 26 mmol/L (ref 22–32)
Calcium: 9.8 mg/dL (ref 8.9–10.3)
Creatinine, Ser: 1.36 mg/dL — ABNORMAL HIGH (ref 0.61–1.24)
GFR calc Af Amer: >60 mL/min (ref >60)
GFR calc non Af Amer: 59 mL/min — ABNORMAL LOW (ref >60)
GLUCOSE: 99 mg/dL (ref 65–99)
POTASSIUM: 3.9 mmol/L (ref 3.5–5.1)
Sodium: 139 mmol/L (ref 135–145)

## 2016-02-16 LAB — GLUCOSE, CAPILLARY
GLUCOSE-CAPILLARY: 128 mg/dL — AB (ref 65–99)
GLUCOSE-CAPILLARY: 140 mg/dL — AB (ref 65–99)
Glucose-Capillary: 101 mg/dL — ABNORMAL HIGH (ref 65–99)

## 2016-02-16 LAB — CBC
HEMATOCRIT: 46.5 % (ref 39.0–52.0)
HEMOGLOBIN: 16.1 g/dL (ref 13.0–17.0)
MCH: 27.8 pg (ref 26.0–34.0)
MCHC: 34.6 g/dL (ref 30.0–36.0)
MCV: 80.3 fL (ref 78.0–100.0)
Platelets: 236 10*3/uL (ref 150–400)
RBC: 5.79 MIL/uL (ref 4.22–5.81)
RDW: 14.1 % (ref 11.5–15.5)
WBC: 9.9 10*3/uL (ref 4.0–10.5)

## 2016-02-16 IMAGING — DX DG CHEST 2V
2 series · 2 of 2 positions shown · non-contrast
Comparison: [DATE]

CLINICAL DATA: Mid chest pain for 3-4 days. Cough and shortness of
breath.

EXAM:
CHEST  2 VIEW

[chest pa]
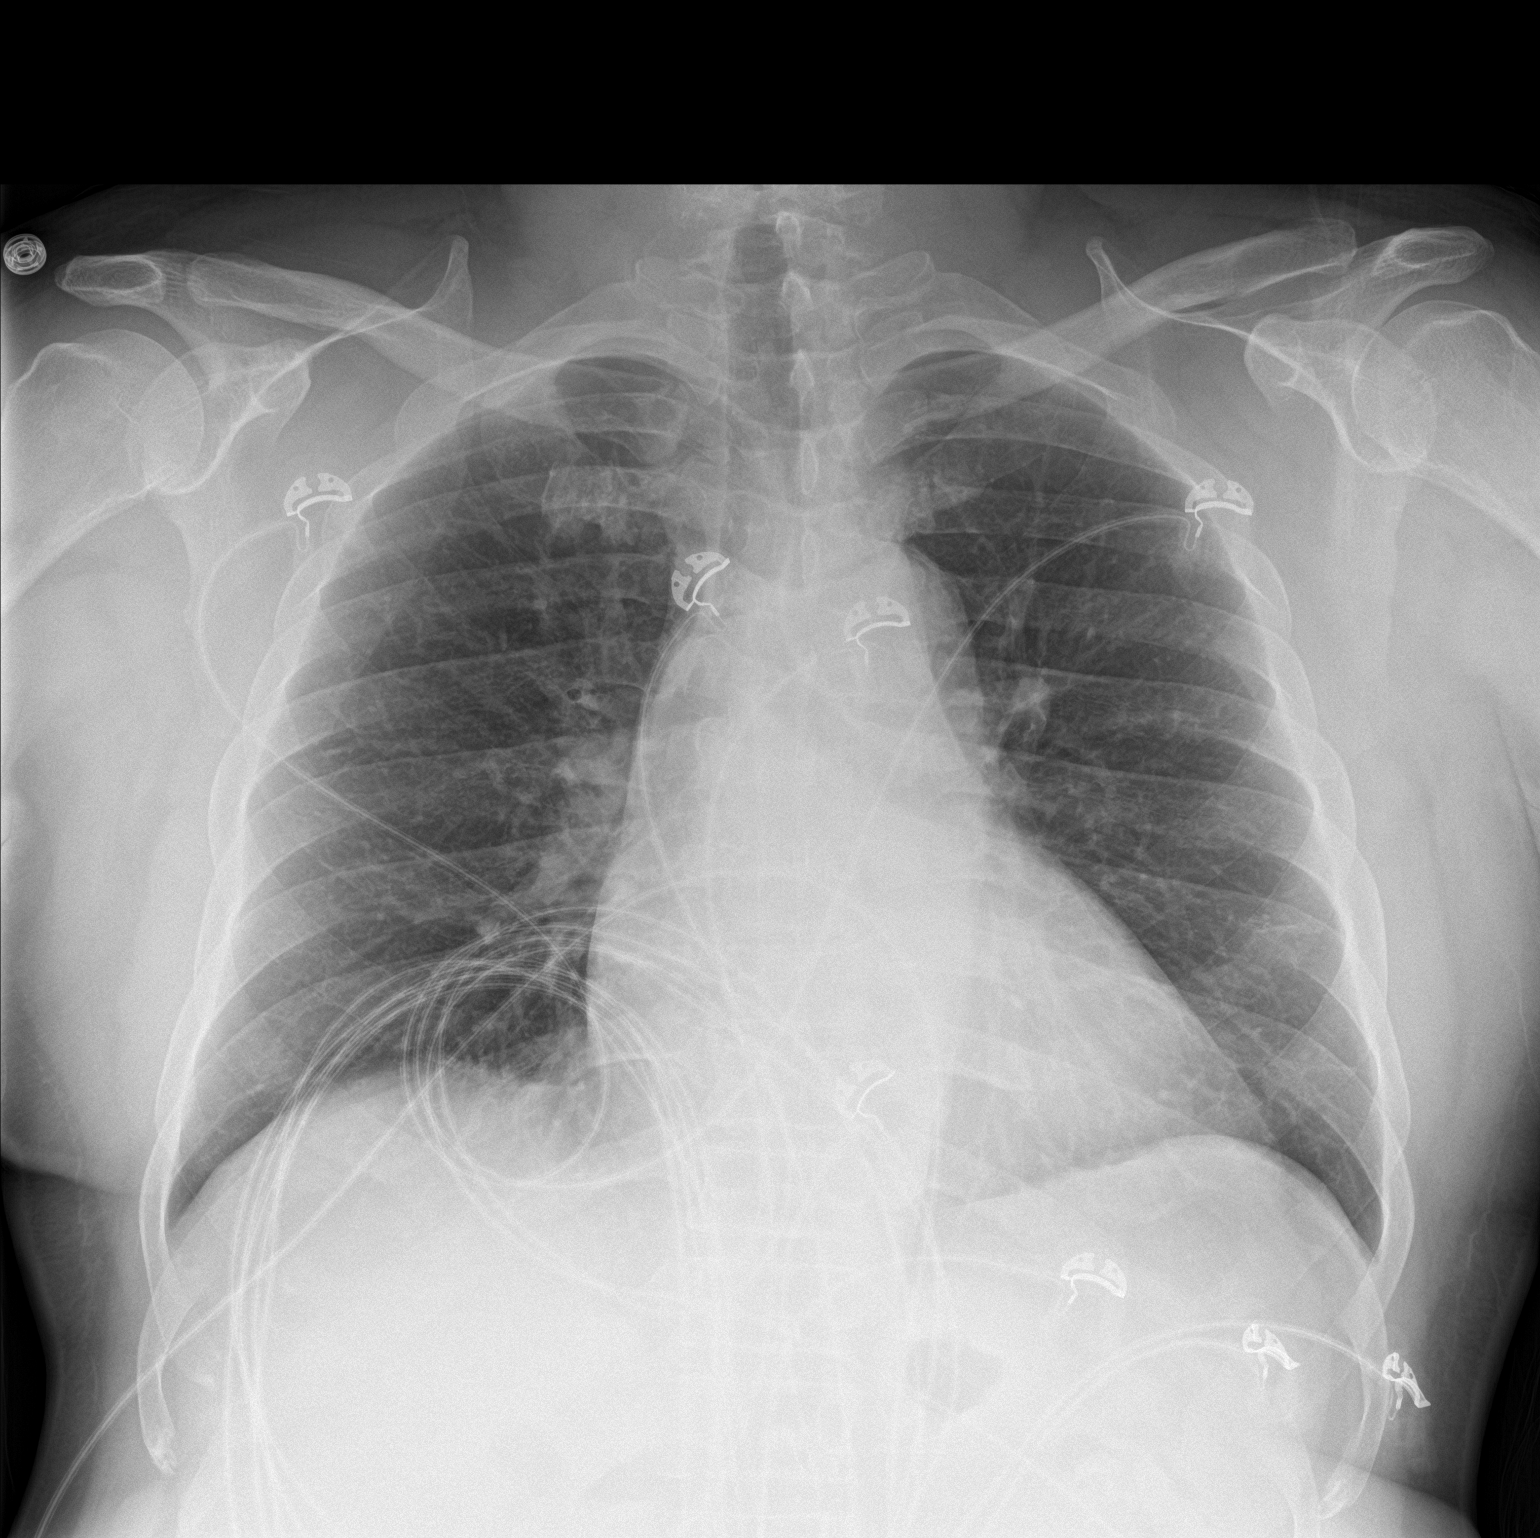

[chest lat]
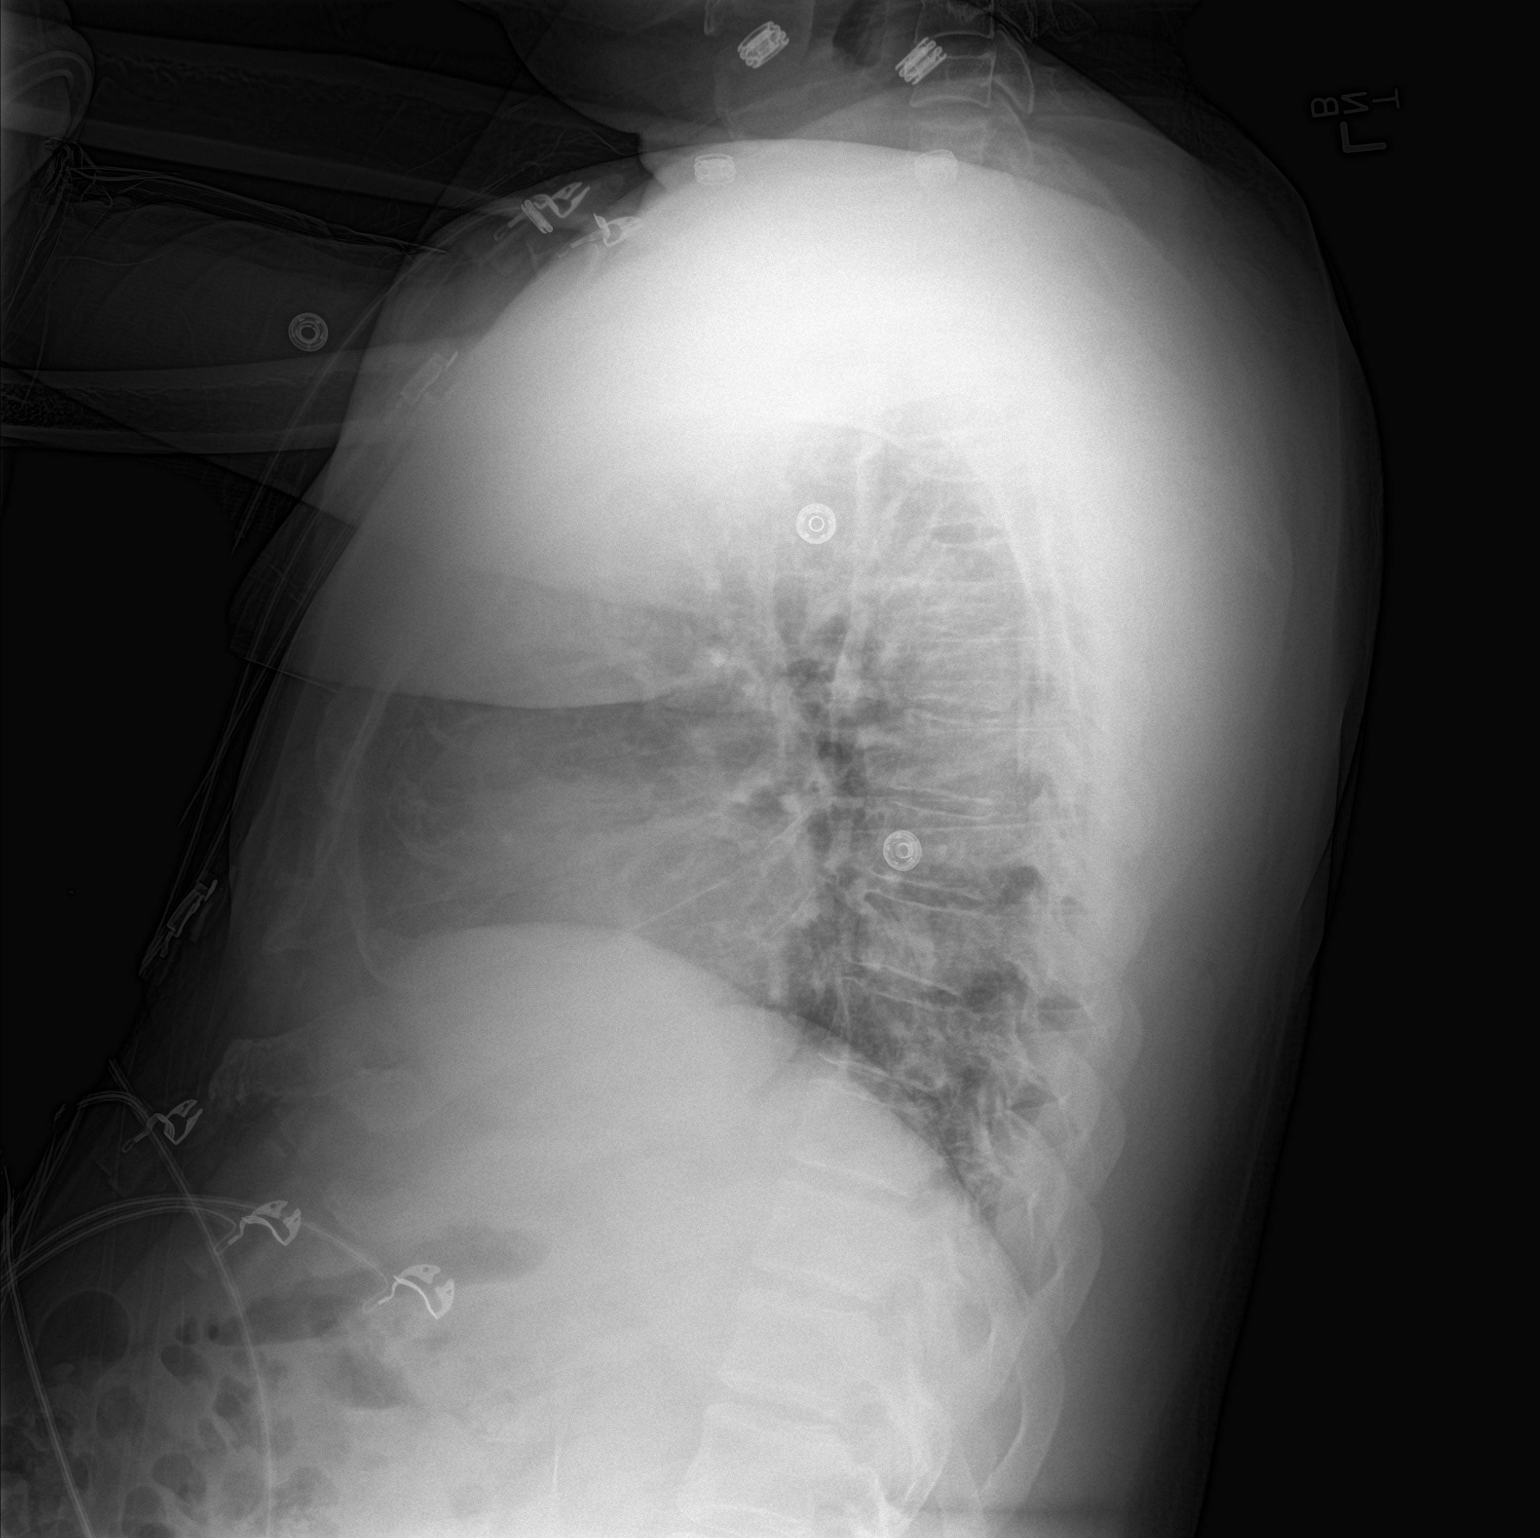

[2 of 2 positions shown; findings below may reference images not displayed]

FINDINGS: The lungs are clear wiithout focal pneumonia, edema, pneumothorax or
pleural effusion. Cardiopericardial silhouette is at upper limits of
normal for size. The visualized bony structures of the thorax are
intact. Telemetry leads overlie the chest.
IMPRESSION: Stable.  No acute cardiopulmonary findings.

## 2016-02-16 MED ORDER — LABETALOL HCL 5 MG/ML IV SOLN
20.0000 mg | Freq: Once | INTRAVENOUS | Status: AC
  Administered 2016-02-16: 20 mg via INTRAVENOUS
  Filled 2016-02-16: qty 4

## 2016-02-16 MED ORDER — HYDROCODONE-ACETAMINOPHEN 5-325 MG PO TABS
1.0000 | ORAL_TABLET | Freq: Four times a day (QID) | ORAL | Status: DC | PRN
  Administered 2016-02-16 – 2016-02-17 (×4): 1 via ORAL
  Filled 2016-02-16 (×4): qty 1

## 2016-02-16 MED ORDER — LOSARTAN POTASSIUM 50 MG PO TABS
25.0000 mg | ORAL_TABLET | Freq: Every day | ORAL | Status: DC
  Administered 2016-02-16 – 2016-02-17 (×2): 25 mg via ORAL
  Filled 2016-02-16 (×5): qty 1

## 2016-02-16 MED ORDER — HYDRALAZINE HCL 20 MG/ML IJ SOLN
10.0000 mg | Freq: Four times a day (QID) | INTRAMUSCULAR | Status: DC | PRN
  Filled 2016-02-16: qty 1

## 2016-02-16 MED ORDER — HYDRALAZINE HCL 25 MG PO TABS
50.0000 mg | ORAL_TABLET | Freq: Three times a day (TID) | ORAL | Status: DC
  Administered 2016-02-16 – 2016-02-17 (×3): 50 mg via ORAL
  Filled 2016-02-16 (×3): qty 2

## 2016-02-16 MED ORDER — AMLODIPINE BESYLATE 5 MG PO TABS
10.0000 mg | ORAL_TABLET | Freq: Every day | ORAL | Status: DC
  Administered 2016-02-16: 10 mg via ORAL
  Filled 2016-02-16: qty 2

## 2016-02-16 MED ORDER — INSULIN ASPART 100 UNIT/ML ~~LOC~~ SOLN
0.0000 [IU] | Freq: Three times a day (TID) | SUBCUTANEOUS | Status: DC
  Administered 2016-02-17: 1 [IU] via SUBCUTANEOUS

## 2016-02-16 MED ORDER — CARVEDILOL 3.125 MG PO TABS
6.2500 mg | ORAL_TABLET | Freq: Two times a day (BID) | ORAL | Status: DC
  Administered 2016-02-16: 6.25 mg via ORAL
  Filled 2016-02-16: qty 2

## 2016-02-16 MED ORDER — ASPIRIN EC 325 MG PO TBEC
325.0000 mg | DELAYED_RELEASE_TABLET | Freq: Every day | ORAL | Status: DC
  Administered 2016-02-17: 325 mg via ORAL
  Filled 2016-02-16: qty 1

## 2016-02-16 MED ORDER — NITROGLYCERIN 0.4 MG SL SUBL: 0.4000 mg | SUBLINGUAL_TABLET | SUBLINGUAL | Status: DC | PRN

## 2016-02-16 MED ORDER — ONDANSETRON HCL 4 MG/2ML IJ SOLN: 4.0000 mg | Freq: Four times a day (QID) | INTRAMUSCULAR | Status: DC | PRN

## 2016-02-16 MED ORDER — ONDANSETRON HCL 4 MG PO TABS: 4.0000 mg | ORAL_TABLET | Freq: Four times a day (QID) | ORAL | Status: DC | PRN

## 2016-02-16 MED ORDER — TRIAMCINOLONE ACETONIDE 0.1 % EX CREA
1.0000 "application " | TOPICAL_CREAM | Freq: Two times a day (BID) | CUTANEOUS | Status: DC
  Administered 2016-02-16: 1 via TOPICAL
  Filled 2016-02-16: qty 15

## 2016-02-16 MED ORDER — CHLORTHALIDONE 25 MG PO TABS
25.0000 mg | ORAL_TABLET | Freq: Every day | ORAL | Status: DC
  Filled 2016-02-16 (×4): qty 1

## 2016-02-16 MED ORDER — ASPIRIN EC 325 MG PO TBEC
325.0000 mg | DELAYED_RELEASE_TABLET | Freq: Every day | ORAL | Status: DC
  Administered 2016-02-16: 325 mg via ORAL

## 2016-02-16 MED ORDER — HEPARIN SODIUM (PORCINE) 5000 UNIT/ML IJ SOLN
5000.0000 [IU] | Freq: Three times a day (TID) | INTRAMUSCULAR | Status: DC
  Administered 2016-02-16: 5000 [IU] via SUBCUTANEOUS
  Filled 2016-02-16 (×2): qty 1

## 2016-02-16 MED ORDER — ASPIRIN 81 MG PO CHEW
324.0000 mg | CHEWABLE_TABLET | Freq: Once | ORAL | Status: AC
  Administered 2016-02-16: 324 mg via ORAL
  Filled 2016-02-16: qty 4

## 2016-02-16 MED ORDER — ALBUTEROL SULFATE (2.5 MG/3ML) 0.083% IN NEBU: 2.5000 mg | INHALATION_SOLUTION | Freq: Four times a day (QID) | RESPIRATORY_TRACT | Status: DC | PRN

## 2016-02-16 MED ORDER — BISACODYL 10 MG RE SUPP: 10.0000 mg | Freq: Every day | RECTAL | Status: DC | PRN

## 2016-02-16 MED ORDER — INSULIN ASPART 100 UNIT/ML ~~LOC~~ SOLN
0.0000 [IU] | Freq: Every day | SUBCUTANEOUS | Status: DC
Start: 2016-02-16 — End: 2016-02-17

## 2016-02-16 NOTE — ED Provider Notes (Signed)
[facility] DEPT Provider Note   CSN: [id number] Arrival date & time: 02/16/16  0740  By signing my name below, I, Charolotte Eke, attest that this documentation has been prepared under the direction and in the presence of Davonna Belling, MD. Electronically Signed: Charolotte Eke, Scribe. 02/16/16. 8:11 AM.   History   Chief Complaint Chief Complaint  Patient presents with  . Chest Pain    HPI HPI Comments: Craig Knight is a 50 y.o. male with h/o HTN who presents to the Emergency Department complaining of moderate episodes of chest pain and headache lasting for minutes to hours that began 3 days ago. Pt has associated nausea, vomiting, sputum producing cough, SOB, leg swelling, sore throat, and congestion. Pt states his pain is occasionally worsened with deep breathing. Pt's father has been sick recently with a similar cough. Pt reports that he has be compliant with his HTN medications, and there have been no recent changes in his dosage. The pain is similar to his last hospitalization in 4/17 for chest pain.  The history is provided by the patient. No language interpreter was used.    Past Medical History:  Diagnosis Date  . Asthma   . Bronchitis   . Hypertension     Patient Active Problem List   Diagnosis Date Noted  . Hypertensive emergency 02/16/2016  . Hypertensive urgency 06/07/2015  . CKD (chronic kidney disease), stage III 06/07/2015  . Pain in the chest   . Elevated troponin   . Chronic diastolic CHF (congestive heart failure) ([place]) 09/17/2013  . DM type 2 (diabetes mellitus, type 2) ([place]) 09/15/2013  . Chest pain 08/01/2011  . HTN (hypertension) 08/01/2011  . Chest pain 07/22/2011  . Renal insufficiency 07/22/2011  . Tobacco abuse 07/22/2011  . Hypertension     Past Surgical History:  Procedure Laterality Date  . lipoma removal         Home Medications    Prior to Admission medications   Medication Sig Start Date End Date Taking? Authorizing  Provider  albuterol (PROVENTIL HFA;VENTOLIN HFA) 108 (90 Base) MCG/ACT inhaler Inhale 2 puffs into the lungs every 6 (six) hours as needed for wheezing or shortness of breath. 06/08/15  Yes Samuella Cota, MD  amLODipine (NORVASC) 10 MG tablet Take 1 tablet (10 mg total) by mouth daily. 06/09/15  Yes Samuella Cota, MD  aspirin EC 81 MG tablet Take 1 tablet (81 mg total) by mouth daily. Patient taking differently: Take 81 mg by mouth 2 (two) times daily.  10/13/13  Yes Arnoldo Lenis, MD  chlorthalidone (HYGROTON) 25 MG tablet Take 1 tablet (25 mg total) by mouth daily. 10/25/15  Yes Mikey Kirschner, MD  losartan (COZAAR) 25 MG tablet Take 1 tablet (25 mg total) by mouth daily. 10/25/15  Yes Mikey Kirschner, MD  metFORMIN (GLUCOPHAGE) 500 MG tablet Take 1 tablet (500 mg total) by mouth daily with breakfast. 10/25/15  Yes Mikey Kirschner, MD  metoprolol (LOPRESSOR) 50 MG tablet Take 1 tablet (50 mg total) by mouth 2 (two) times daily. 10/25/15  Yes Mikey Kirschner, MD  triamcinolone cream (KENALOG) 0.1 % Apply 1 application topically 2 (two) times daily. 10/25/15  Yes Mikey Kirschner, MD    Family History Family History  Problem Relation Age of Onset  . Stroke Mother   . Heart attack Father 72    CABG  . Hypertension Sister   . Stroke Brother   . Hypertension Brother  Social History Social History  Substance Use Topics  . Smoking status: Current Some Day Smoker    Packs/day: 0.50    Years: 25.00    Types: Cigarettes  . Smokeless tobacco: Former Systems developer    Quit date: 07/11/2015  . Alcohol use 0.0 oz/week     Comment: occ     Allergies   Dust mite extract and Lisinopril   Review of Systems Review of Systems  Constitutional: Negative for fever.  HENT: Positive for congestion and sore throat.   Respiratory: Positive for cough and shortness of breath.   Cardiovascular: Positive for chest pain and leg swelling.  Gastrointestinal: Positive for nausea and vomiting.    Neurological: Positive for headaches.     Physical Exam Updated Vital Signs BP (!) 182/115 (BP Location: Left Arm)   Pulse (!) 58   Temp 97.7 F (36.5 C) (Oral)   Resp 12   Ht 5\' 7"  (1.702 m)   Wt 216 lb 1.6 oz (98 kg)   SpO2 100%   BMI 33.85 kg/m   Physical Exam  Constitutional: He is oriented to person, place, and time. He appears well-developed and well-nourished.  Awake and appropriate.  HENT:  Head: Normocephalic and atraumatic.  Mild posterior erythema with exudate.   Cardiovascular: Normal rate and regular rhythm.   Pulmonary/Chest: Effort normal. He has no wheezes. He has no rales. He exhibits no tenderness.  Abdominal: Soft. There is no tenderness.  Musculoskeletal: He exhibits edema.  Trace peripheral edema.  Neurological: He is alert and oriented to person, place, and time.  Skin: Skin is warm and dry.  Psychiatric: He has a normal mood and affect.  Nursing note and vitals reviewed.    ED Treatments / Results   DIAGNOSTIC STUDIES: Oxygen Saturation is 98% on room air, normal by my interpretation.    COORDINATION OF CARE: 8:00 AM Discussed treatment plan with pt at bedside and pt agreed to plan.    Labs (all labs ordered are listed, but only abnormal results are displayed) Labs Reviewed  BASIC METABOLIC PANEL - Abnormal; Notable for the following:       Result Value   BUN 22 (*)    Creatinine, Ser 1.36 (*)    GFR calc non Af Amer 59 (*)    All other components within normal limits  TROPONIN I - Abnormal; Notable for the following:    Troponin I 0.06 (*)    All other components within normal limits  TROPONIN I - Abnormal; Notable for the following:    Troponin I 0.05 (*)    All other components within normal limits  GLUCOSE, CAPILLARY - Abnormal; Notable for the following:    Glucose-Capillary 101 (*)    All other components within normal limits  CBC  RAPID URINE DRUG SCREEN, HOSP PERFORMED  TROPONIN I  HEMOGLOBIN A1C    EKG  EKG  Interpretation  Date/Time:  Friday February 16 2016 07:48:01 EST Ventricular Rate:  74 PR Interval:    QRS Duration: 89 QT Interval:  391 QTC Calculation: 434 R Axis:   -16 Text Interpretation:  Sinus rhythm Left ventricular hypertrophy Anterior infarct, old Nonspecific T abnormalities, inferior leads No significant change since last tracing Confirmed by Alvino Chapel  MD, Mozella Rexrode [phone]) on 02/16/2016 8:04:31 AM       Radiology Dg Chest 2 View  Result Date: 02/16/2016 CLINICAL DATA:  Mid chest pain for 3-4 days. Cough and shortness of breath. EXAM: CHEST  2 VIEW COMPARISON:  06/07/2015 FINDINGS:  The lungs are clear wiithout focal pneumonia, edema, pneumothorax or pleural effusion. Cardiopericardial silhouette is at upper limits of normal for size. The visualized bony structures of the thorax are intact. Telemetry leads overlie the chest. IMPRESSION: Stable.  No acute cardiopulmonary findings. Electronically Signed   By: Misty Stanley M.D.   On: 02/16/2016 08:51    Procedures Procedures (including critical care time)  Medications Ordered in ED Medications  amLODipine (NORVASC) tablet 10 mg (10 mg Oral Given 02/16/16 0939)  losartan (COZAAR) tablet 25 mg (25 mg Oral Given 02/16/16 1041)  carvedilol (COREG) tablet 6.25 mg (0 mg Oral Hold 02/16/16 1040)  hydrALAZINE (APRESOLINE) injection 10 mg (not administered)  chlorthalidone (HYGROTON) tablet 25 mg (not administered)  albuterol (PROVENTIL) (2.5 MG/3ML) 0.083% nebulizer solution 2.5 mg (not administered)  triamcinolone cream (KENALOG) 0.1 % 1 application (not administered)  aspirin EC tablet 325 mg (not administered)  nitroGLYCERIN (NITROSTAT) SL tablet 0.4 mg (not administered)  HYDROcodone-acetaminophen (NORCO/VICODIN) 5-325 MG per tablet 1 tablet (1 tablet Oral Given 02/16/16 1114)  ondansetron (ZOFRAN) tablet 4 mg (not administered)    Or  ondansetron (ZOFRAN) injection 4 mg (not administered)  heparin injection 5,000 Units (not  administered)  bisacodyl (DULCOLAX) suppository 10 mg (not administered)  insulin aspart (novoLOG) injection 0-9 Units (0 Units Subcutaneous Not Given 02/16/16 1200)  insulin aspart (novoLOG) injection 0-5 Units (not administered)  hydrALAZINE (APRESOLINE) tablet 50 mg (not administered)  labetalol (NORMODYNE,TRANDATE) injection 20 mg (20 mg Intravenous Given 02/16/16 0940)  aspirin chewable tablet 324 mg (324 mg Oral Given 02/16/16 0939)     Initial Impression / Assessment and Plan / ED Course  I have reviewed the triage vital signs and the nursing notes.  Pertinent labs & imaging results that were available during my care of the patient were reviewed by me and considered in my medical decision making (see chart for details).  Clinical Course     Patient with hypertension. Chest pain. Troponin is mildly elevated. With continued chest pain and elevated troponin and blood pressure initially was 200/130 will admit to internal medicine.  Final Clinical Impressions(s) / ED Diagnoses   Final diagnoses:  Hypertensive emergency    New Prescriptions Current Discharge Medication List     I personally performed the services described in this documentation, which was scribed in my presence. The recorded information has been reviewed and is accurate.        Davonna Belling, MD 02/16/16 [phone]

## 2016-02-16 NOTE — H&P (Signed)
TRH H&P   Patient Demographics:    Craig Knight, is a 50 y.o. male  MRN: [id number]   DOB - 03-Apr-1965  Admit Date - 02/16/2016  Outpatient Primary MD for the patient is [name], MD    Patient coming from: Home  Chief Complaint  Patient presents with  . Chest Pain      HPI:    Craig Knight  is a 50 y.o. male, History of hypertension which is poorly controlled, CK D stage III, ongoing smoking abuse, asthma and bronchitis, previous EKG changes consistent with LVH, chronic grade 2 diastolic CHF with EF [id number] on previous echo comes to the hospital with 3-4 day history of Knight headache, exertional chest pain, exertional shortness of breath, and blood pressure running high, on further interview he says that he has been getting exertional chest pain for the last few months but since his blood pressure has been high his symptoms have gotten a little worse.  He denies any headaches at this point, denies any cough phlegm, no fevers, currently he is symptom-free, in the ER his blood pressure was noted to be close to 180/110, his troponin was marginally elevated and I was called to admit the patient for hypertensive urgency.    Review of systems:    In addition to the HPI above, Currently negative No Fever-chills, No Headache, No changes with Vision or hearing, No problems swallowing food or Liquids, No Chest pain, Cough or Shortness of Breath, No Abdominal pain, No Nausea or Vommitting, Bowel movements are regular, No Blood in stool or Urine, No dysuria, No new skin rashes or bruises, No new joints pains-aches,  No new weakness, tingling, numbness in any extremity, No recent weight gain or loss, No polyuria,  polydypsia or polyphagia, No significant Mental Stressors.  A full 10 point Review of Systems was done, except as stated above, all other Review of Systems were negative.   With Past History of the following :    Past Medical History:  Diagnosis Date  . Asthma   . Bronchitis   . Hypertension       Past Surgical History:  Procedure Laterality Date  . lipoma removal        Social History:     Social History  Substance Use Topics  . Smoking status: Current Some Day Smoker  Packs/day: 0.50    Years: 25.00    Types: Cigarettes  . Smokeless tobacco: Former Systems developer    Quit date: 07/11/2015  . Alcohol use 0.0 oz/week     Comment: occ         Family History :     Family History  Problem Relation Age of Onset  . Stroke Mother   . Heart attack Father 38    CABG  . Hypertension Sister   . Stroke Brother   . Hypertension Brother        Home Medications:   Prior to Admission medications   Medication Sig Start Date End Date Taking? Authorizing Provider  albuterol (PROVENTIL HFA;VENTOLIN HFA) 108 (90 Base) MCG/ACT inhaler Inhale 2 puffs into the lungs every 6 (six) hours as needed for wheezing or shortness of breath. 06/08/15  Yes Samuella Cota, MD  amLODipine (NORVASC) 10 MG tablet Take 1 tablet (10 mg total) by mouth daily. 06/09/15  Yes Samuella Cota, MD  aspirin EC 81 MG tablet Take 1 tablet (81 mg total) by mouth daily. Patient taking differently: Take 81 mg by mouth 2 (two) times daily.  10/13/13  Yes Arnoldo Lenis, MD  chlorthalidone (HYGROTON) 25 MG tablet Take 1 tablet (25 mg total) by mouth daily. 10/25/15  Yes Mikey Kirschner, MD  losartan (COZAAR) 25 MG tablet Take 1 tablet (25 mg total) by mouth daily. 10/25/15  Yes Mikey Kirschner, MD  metFORMIN (GLUCOPHAGE) 500 MG tablet Take 1 tablet (500 mg total) by mouth daily with breakfast. 10/25/15  Yes Mikey Kirschner, MD  metoprolol (LOPRESSOR) 50 MG tablet Take 1 tablet (50 mg total) by mouth 2 (two)  times daily. 10/25/15  Yes Mikey Kirschner, MD  triamcinolone cream (KENALOG) 0.1 % Apply 1 application topically 2 (two) times daily. 10/25/15  Yes Mikey Kirschner, MD     Allergies:     Allergies  Allergen Reactions  . Dust Mite Extract   . Lisinopril Cough     Physical Exam:   Vitals  Blood pressure (!) 170/107, pulse 62, temperature 97.8 F (36.6 C), temperature source Oral, resp. rate 16, height 5\' 7"  (1.702 m), weight 97.5 kg (215 lb), SpO2 99 %.   1. General Middle-aged African-American male lying in bed in NAD,     2. Normal affect and insight, Not Suicidal or Homicidal, Awake Alert, Oriented X 3.  3. No F.N deficits, ALL C.Nerves Intact, Strength 5/5 all 4 extremities, Sensation intact all 4 extremities, Plantars down going.  4. Ears and Eyes appear Normal, Conjunctivae clear, PERRLA. Moist Oral Mucosa.  5. Supple Neck, No JVD, No cervical lymphadenopathy appriciated, No Carotid Bruits.  6. Symmetrical Chest wall movement, Good air movement bilaterally, CTAB.  7. RRR, No Gallops, Rubs or Murmurs, No Parasternal Heave.  8. Positive Bowel Sounds, Abdomen Soft, No tenderness, No organomegaly appriciated,No rebound -guarding or rigidity.  9.  No Cyanosis, Normal Skin Turgor, No Skin Rash or Bruise.  10. Good muscle tone,  joints appear normal , no effusions, Normal ROM.  11. No Palpable Lymph Nodes in Neck or Axillae      Data Review:    CBC  Recent Labs Lab 02/16/16 0800  WBC 9.9  HGB 16.1  HCT 46.5  PLT 236  MCV 80.3  MCH 27.8  MCHC 34.6  RDW 14.1   ------------------------------------------------------------------------------------------------------------------  Chemistries   Recent Labs Lab 02/16/16 0800  NA 139  K 3.9  CL 107  CO2  26  GLUCOSE 99  BUN 22*  CREATININE 1.36*  CALCIUM 9.8   ------------------------------------------------------------------------------------------------------------------ estimated creatinine clearance  is 71.5 mL/min (by C-G formula based on SCr of 1.36 mg/dL (H)). ------------------------------------------------------------------------------------------------------------------ No results for input(s): TSH, T4TOTAL, T3FREE, THYROIDAB in the last 72 hours.  Invalid input(s): FREET3  Coagulation profile No results for input(s): INR, PROTIME in the last 168 hours. ------------------------------------------------------------------------------------------------------------------- No results for input(s): DDIMER in the last 72 hours. -------------------------------------------------------------------------------------------------------------------  Cardiac Enzymes  Recent Labs Lab 02/16/16 0800  TROPONINI 0.06*   ------------------------------------------------------------------------------------------------------------------    Component Value Date/Time   BNP 48.0 06/07/2015 0115     ---------------------------------------------------------------------------------------------------------------  Urinalysis    Component Value Date/Time   COLORURINE YELLOW 09/15/2013 1011   APPEARANCEUR CLEAR 09/15/2013 1011   LABSPEC 1.025 09/15/2013 1011   PHURINE 5.5 09/15/2013 1011   GLUCOSEU NEGATIVE 09/15/2013 1011   HGBUR NEGATIVE 09/15/2013 1011   BILIRUBINUR NEGATIVE 09/15/2013 1011   KETONESUR NEGATIVE 09/15/2013 1011   PROTEINUR NEGATIVE 09/15/2013 1011   UROBILINOGEN 0.2 09/15/2013 1011   NITRITE NEGATIVE 09/15/2013 1011   LEUKOCYTESUR NEGATIVE 09/15/2013 1011    ----------------------------------------------------------------------------------------------------------------   Imaging Results:    Dg Chest 2 View  Result Date: 02/16/2016 CLINICAL DATA:  Mid chest pain for 3-4 days. Cough and shortness of breath. EXAM: CHEST  2 VIEW COMPARISON:  06/07/2015 FINDINGS: The lungs are clear wiithout focal pneumonia, edema, pneumothorax or pleural effusion. Cardiopericardial  silhouette is at upper limits of normal for size. The visualized bony structures of the thorax are intact. Telemetry leads overlie the chest. IMPRESSION: Stable.  No acute cardiopulmonary findings. Electronically Signed   By: Misty Stanley M.D.   On: 02/16/2016 08:51    My personal review of EKG: Rhythm NSR, With LVH pattern and chronic flipped T waves in lateral leads   Assessment & Plan:     1. Hypertensive urgency. We'll keep him in 62 are observation on telemetry bed, have placed him on appropriate medications which include Norvasc, Coreg, ARB and when necessary hydralazine. We'll monitor.  2. Exertional chest pain. Has been stable in pattern for the last several months, currently mildly elevated one set of troponin due to #1 above, EKG has no new changes, he is currently chest pain-free. We'll place him on aspirin along with beta blocker. Get an echocardiogram to evaluate EF and wall motion. If all stable and troponin trend is except able will have him do outpatient stress test with cardiology within a week.  3. Chronic grade 2 diastolic dysfunction. Last EF 60%. Currently compensated.  4. Tobacco abuse. Counseled to quit.  5. DM type II. Check A1c place on sliding scale. Hold Glucophage.  6. CK D stage III. Creatinine at baseline of close to 1.5.     DVT Prophylaxis Heparin    AM Labs Ordered, also please review Full Orders  Family Communication: Admission, patients condition and plan of care including tests being ordered have been discussed with the patient and wife who indicate understanding and agree with the plan and Code Status.  Code Status Full  Likely DC to  Home in am  Condition Fair  Consults called: None    Admission status: Obs    Time spent in minutes : 35   Lala Lund K M.D on 02/16/2016 at 10:06 AM  Between 7am to 7pm - Pager - [phone]. After 7pm go to [email or web address] - password [facility]  [facility] - Office  [phone]

## 2016-02-16 NOTE — ED Triage Notes (Signed)
Pt reports cp in center of chest radiating to neck x3 days with sob and n/v.  Pt alert and oriented.

## 2016-02-16 NOTE — ED Notes (Signed)
CRITICAL VALUE ALERT  Critical value received:  Troponin 0.06  Date of notification:  02/16/2016  Time of notification:  0903  Critical value read back:  Yes  Nurse who received alert:  Cena Benton  MD notified (1st page):  Dr. Alvino Chapel  Time of first page:  [phone]  Responding Md:  Dr. Alvino Chapel  Time MD responded:  [phone]

## 2016-02-16 NOTE — Progress Notes (Signed)
*  PRELIMINARY RESULTS* Echocardiogram 2D Echocardiogram has been performed.  Craig Knight 02/16/2016, 5:06 PM

## 2016-02-17 DIAGNOSIS — I160 Hypertensive urgency: Secondary | ICD-10-CM | POA: Diagnosis not present

## 2016-02-17 LAB — CBC
HCT: 45.8 % (ref 39.0–52.0)
Hemoglobin: 15.1 g/dL (ref 13.0–17.0)
MCH: 26.7 pg (ref 26.0–34.0)
MCHC: 33 g/dL (ref 30.0–36.0)
MCV: 81.1 fL (ref 78.0–100.0)
PLATELETS: 233 10*3/uL (ref 150–400)
RBC: 5.65 MIL/uL (ref 4.22–5.81)
RDW: 14.5 % (ref 11.5–15.5)
WBC: 10.2 10*3/uL (ref 4.0–10.5)

## 2016-02-17 LAB — BASIC METABOLIC PANEL
Anion gap: 8 (ref 5–15)
BUN: 25 mg/dL — ABNORMAL HIGH (ref 6–20)
CALCIUM: 9 mg/dL (ref 8.9–10.3)
CO2: 25 mmol/L (ref 22–32)
CREATININE: 1.48 mg/dL — AB (ref 0.61–1.24)
Chloride: 106 mmol/L (ref 101–111)
GFR calc non Af Amer: 53 mL/min — ABNORMAL LOW (ref >60)
GLUCOSE: 107 mg/dL — AB (ref 65–99)
Potassium: 3.8 mmol/L (ref 3.5–5.1)
Sodium: 139 mmol/L (ref 135–145)

## 2016-02-17 LAB — GLUCOSE, CAPILLARY
GLUCOSE-CAPILLARY: 102 mg/dL — AB (ref 65–99)
Glucose-Capillary: 121 mg/dL — ABNORMAL HIGH (ref 65–99)

## 2016-02-17 LAB — HEMOGLOBIN A1C
HEMOGLOBIN A1C: 6.4 % — AB (ref 4.8–5.6)
Mean Plasma Glucose: 137 mg/dL

## 2016-02-17 MED ORDER — CARVEDILOL 3.125 MG PO TABS
6.2500 mg | ORAL_TABLET | Freq: Two times a day (BID) | ORAL | Status: DC
  Filled 2016-02-17: qty 2

## 2016-02-17 MED ORDER — NITROGLYCERIN 0.4 MG SL SUBL
0.4000 mg | SUBLINGUAL_TABLET | SUBLINGUAL | 0 refills | Status: DC | PRN
Start: 2016-02-17 — End: 2016-02-28

## 2016-02-17 MED ORDER — CARVEDILOL 6.25 MG PO TABS: 6.2500 mg | ORAL_TABLET | Freq: Two times a day (BID) | ORAL | 0 refills | Status: DC

## 2016-02-17 MED ORDER — AMLODIPINE BESYLATE 5 MG PO TABS
10.0000 mg | ORAL_TABLET | Freq: Every day | ORAL | Status: DC
  Administered 2016-02-17: 10 mg via ORAL
  Filled 2016-02-17: qty 2

## 2016-02-17 MED ORDER — HYDRALAZINE HCL 25 MG PO TABS: 50.0000 mg | ORAL_TABLET | Freq: Three times a day (TID) | ORAL | Status: DC

## 2016-02-17 MED ORDER — HYDRALAZINE HCL 50 MG PO TABS: 50.0000 mg | ORAL_TABLET | Freq: Three times a day (TID) | ORAL | 0 refills | Status: DC

## 2016-02-17 MED ORDER — ISOSORBIDE MONONITRATE ER 60 MG PO TB24
60.0000 mg | ORAL_TABLET | Freq: Every day | ORAL | Status: DC
  Administered 2016-02-17: 60 mg via ORAL
  Filled 2016-02-17: qty 1

## 2016-02-17 MED ORDER — ISOSORBIDE MONONITRATE ER 60 MG PO TB24: 60.0000 mg | ORAL_TABLET | Freq: Every day | ORAL | 0 refills | Status: DC

## 2016-02-17 NOTE — Discharge Instructions (Signed)
Follow with Primary MD [name], MD in 7 days   Get CBC, CMP, 2 view Chest X ray checked  by Primary MD or SNF MD in 5-7 days ( we routinely change or add medications that can affect your baseline labs and fluid status, therefore we recommend that you get the mentioned basic workup next visit with your PCP, your PCP may decide not to get them or add new tests based on their clinical decision)   Activity: Gentle household activity, do not do any exertional activity until you get your stress test done   Disposition Home    Diet:   Heart Healthy low carbohydrate.  For Heart failure patients - Check your Weight same time everyday, if you gain over 2 pounds, or you develop in leg swelling, experience more shortness of breath or chest pain, call your Primary MD immediately. Follow Cardiac Low Salt Diet and 1.5 lit/day fluid restriction.   On your next visit with your primary care physician please Get Medicines reviewed and adjusted.   Please request your Prim.MD to go over all Hospital Tests and Procedure/Radiological results at the follow up, please get all Hospital records sent to your Prim MD by signing hospital release before you go home.   If you experience worsening of your admission symptoms, develop shortness of breath, life threatening emergency, suicidal or homicidal thoughts you must seek medical attention immediately by calling 911 or calling your MD immediately  if symptoms less severe.  You Must read complete instructions/literature along with all the possible adverse reactions/side effects for all the Medicines you take and that have been prescribed to you. Take any new Medicines after you have completely understood and accpet all the possible adverse reactions/side effects.   Do not drive, operate heavy machinery, perform activities at heights, swimming or participation in water activities or provide baby sitting services if your were admitted for syncope or siezures until you  have seen by Primary MD or a Neurologist and advised to do so again.  Do not drive when taking Pain medications.    Do not take more than prescribed Pain, Sleep and Anxiety Medications  Special Instructions: If you have smoked or chewed Tobacco  in the last 2 yrs please stop smoking, stop any regular Alcohol  and or any Recreational drug use.  Wear Seat belts while driving.   Please note  You were cared for by a hospitalist during your hospital stay. If you have any questions about your discharge medications or the care you received while you were in the hospital after you are discharged, you can call the unit and asked to speak with the hospitalist on call if the hospitalist that took care of you is not available. Once you are discharged, your primary care physician will handle any further medical issues. Please note that NO REFILLS for any discharge medications will be authorized once you are discharged, as it is imperative that you return to your primary care physician (or establish a relationship with a primary care physician if you do not have one) for your aftercare needs so that they can reassess your need for medications and monitor your lab values.

## 2016-02-17 NOTE — Progress Notes (Signed)
Patient with orders to be discharge home. Discharge instructions given, patient verbalized understanding, Patient stable. Patient left in private vehicle with family.

## 2016-02-17 NOTE — Discharge Summary (Addendum)
Craig Knight [id number] DOB: [birth year] DOA: 02/16/2016  PCP: [name], MD  Admit date: 02/16/2016  Discharge date: 02/17/2016  Admitted From: Home  Disposition: Home   Recommendations for Outpatient Follow-up:   Follow up with PCP in 1-2 weeks  PCP Please obtain BMP/CBC, 2 view CXR in 1week,  (see Discharge instructions)   PCP Please follow up on the following pending results: Needs outpatient stress test within a week, monitor blood pressure and BMP closely.   Home Health: None Equipment/Devices: None  Consultations: None Discharge Condition: Stable   CODE STATUS: Full   Diet Recommendation:  Heart Healthy    Chief Complaint  Patient presents with  . Chest Pain     Brief history of present illness from the day of admission and additional interim summary     Craig Knight  is a 50 y.o. male, History of hypertension which is poorly controlled, CK D stage III, ongoing smoking abuse, asthma and bronchitis, previous EKG changes consistent with LVH, chronic grade 2 diastolic CHF with EF [id number] on previous echo comes to the hospital with 3-4 day history of dull headache, exertional chest pain, exertional shortness of breath, and blood pressure running high, on further interview he says that he has been getting exertional chest pain for the last few months but since his blood pressure has been high his symptoms have gotten a little worse.  He denies any headaches at this point, denies any cough phlegm, no fevers, currently he is symptom-free, in the ER his blood pressure was noted to be close to 180/110, his troponin was marginally elevated and I was called to admit the patient for hypertensive urgency.  Hospital issues addressed     1. Hypertensive urgency. He was kept in observation, blood pressure  medications were adjusted with good effect he is currently on combination of Norvasc, Coreg, Imdur, ARB table blood pressure, counseled on compliance and to follow-up with PCP and cardiologist within a week.  2. Exertional chest pain. Has been stable in pattern for the last several months, in fact with blood pressure control he is chest pain-free, he ambulated in the hallway with me without any chest discomfort. Troponin trend was mildly elevated and flat in non-ACS pattern likely due to demand ischemia from severe LVH and hypertensive urgency. EKG had no new changes. He is on aspirin, beta blocker and Imdur, when necessary sublingual nitroglycerin given. Requested to follow with cardiology within a week for outpatient stress test and requested not to overexert himself till the stress test. Echo showed preserved EF without any wall motion abnormality.   3. Chronic grade 2 diastolic dysfunction. Last EF 60%. Currently compensated.  4. Tobacco abuse. Counseled to quit.  5. DM type II. Continue home regimen A1c pending PCP to follow.  6. CK D stage III. Creatinine at baseline of close to 1.5.   Discharge diagnosis     Principal Problem:   Hypertensive urgency Active Problems:   Tobacco abuse   DM type 2 (diabetes mellitus, type 2) ([place])  Chronic diastolic CHF (congestive heart failure) ([place])   Hypertensive emergency    Discharge instructions    Discharge Instructions    Diet - low sodium heart healthy    Complete by:  As directed    Discharge instructions    Complete by:  As directed    Follow with Primary MD [name], MD in 7 days   Get CBC, CMP, 2 view Chest X ray checked  by Primary MD or SNF MD in 5-7 days ( we routinely change or add medications that can affect your baseline labs and fluid status, therefore we recommend that you get the mentioned basic workup next visit with your PCP, your PCP may decide not to get them or add new tests based on their clinical  decision)   Activity: Gentle household activity, do not do any exertional activity until you get your stress test done   Disposition Home    Diet:   Heart Healthy low carbohydrate.  For Heart failure patients - Check your Weight same time everyday, if you gain over 2 pounds, or you develop in leg swelling, experience more shortness of breath or chest pain, call your Primary MD immediately. Follow Cardiac Low Salt Diet and 1.5 lit/day fluid restriction.   On your next visit with your primary care physician please Get Medicines reviewed and adjusted.   Please request your Prim.MD to go over all Hospital Tests and Procedure/Radiological results at the follow up, please get all Hospital records sent to your Prim MD by signing hospital release before you go home.   If you experience worsening of your admission symptoms, develop shortness of breath, life threatening emergency, suicidal or homicidal thoughts you must seek medical attention immediately by calling 911 or calling your MD immediately  if symptoms less severe.  You Must read complete instructions/literature along with all the possible adverse reactions/side effects for all the Medicines you take and that have been prescribed to you. Take any new Medicines after you have completely understood and accpet all the possible adverse reactions/side effects.   Do not drive, operate heavy machinery, perform activities at heights, swimming or participation in water activities or provide baby sitting services if your were admitted for syncope or siezures until you have seen by Primary MD or a Neurologist and advised to do so again.  Do not drive when taking Pain medications.    Do not take more than prescribed Pain, Sleep and Anxiety Medications  Special Instructions: If you have smoked or chewed Tobacco  in the last 2 yrs please stop smoking, stop any regular Alcohol  and or any Recreational drug use.  Wear Seat belts while  driving.   Please note  You were cared for by a hospitalist during your hospital stay. If you have any questions about your discharge medications or the care you received while you were in the hospital after you are discharged, you can call the unit and asked to speak with the hospitalist on call if the hospitalist that took care of you is not available. Once you are discharged, your primary care physician will handle any further medical issues. Please note that NO REFILLS for any discharge medications will be authorized once you are discharged, as it is imperative that you return to your primary care physician (or establish a relationship with a primary care physician if you do not have one) for your aftercare needs so that they can reassess your need for medications and monitor your lab values.  Discharge Medications   Allergies as of 02/17/2016      Reactions   Dust Mite Extract    Lisinopril Cough      Medication List    STOP taking these medications   metoprolol 50 MG tablet Commonly known as:  LOPRESSOR     TAKE these medications   albuterol 108 (90 Base) MCG/ACT inhaler Commonly known as:  PROVENTIL HFA;VENTOLIN HFA Inhale 2 puffs into the lungs every 6 (six) hours as needed for wheezing or shortness of breath.   amLODipine 10 MG tablet Commonly known as:  NORVASC Take 1 tablet (10 mg total) by mouth daily.   aspirin EC 81 MG tablet Take 1 tablet (81 mg total) by mouth daily. What changed:  when to take this   carvedilol 6.25 MG tablet Commonly known as:  COREG Take 1 tablet (6.25 mg total) by mouth 2 (two) times daily with a meal.   chlorthalidone 25 MG tablet Commonly known as:  HYGROTON Take 1 tablet (25 mg total) by mouth daily.   hydrALAZINE 50 MG tablet Commonly known as:  APRESOLINE Take 1 tablet (50 mg total) by mouth every 8 (eight) hours.   isosorbide mononitrate 60 MG 24 hr tablet Commonly known as:  IMDUR Take 1 tablet (60 mg total) by mouth  daily. Start taking on:  02/18/2016   losartan 25 MG tablet Commonly known as:  COZAAR Take 1 tablet (25 mg total) by mouth daily.   metFORMIN 500 MG tablet Commonly known as:  GLUCOPHAGE Take 1 tablet (500 mg total) by mouth daily with breakfast.   nitroGLYCERIN 0.4 MG SL tablet Commonly known as:  NITROSTAT Place 1 tablet (0.4 mg total) under the tongue every 5 (five) minutes as needed for chest pain.   triamcinolone cream 0.1 % Commonly known as:  KENALOG Apply 1 application topically 2 (two) times daily.       Follow-up Information    [name], MD. Schedule an appointment as soon as possible for a visit in 1 week(s).   Specialty:  Family Medicine Contact information: [address] [address] [phone]        Rozann Lesches, MD. Schedule an appointment as soon as possible for a visit in 3 day(s).   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 Grade 1 diastolic dysfunction. Mild left atrial enlargement. Trivial tricuspid regurgitation.    Dg Chest 2 View  Result Date: 02/16/2016 CLINICAL DATA:  Mid chest pain for 3-4 days. Cough and shortness of breath. EXAM: CHEST  2 VIEW COMPARISON:  06/07/2015 FINDINGS: The lungs are clear wiithout focal pneumonia, edema, pneumothorax or pleural effusion. Cardiopericardial silhouette is at upper limits of normal for size. The visualized bony structures of the thorax are intact. Telemetry leads overlie the chest. IMPRESSION: Stable.  No acute cardiopulmonary findings. Electronically Signed   By: Misty Stanley M.D.   On: 02/16/2016  08:51    Micro Results     No results found for this or any previous visit (from the past 240 hour(s)).  Today   Subjective  [name] today has no headache,no chest abdominal pain,no new weakness tingling or numbness, feels much better wants to go home today.     Objective   Blood pressure (!) 140/94, pulse 69, temperature 98.2 F (36.8 C), temperature source Oral, resp. rate 16, height 5\' 7"  (1.702 m), weight 97.9 kg (215 lb 14.4 oz), SpO2 98 %.   Intake/Output Summary (Last 24 hours) at 02/17/16 0948 Last data filed at 02/17/16 0900  Gross per 24 hour  Intake              240 ml  Output                0 ml  Net              240 ml    Exam Awake Alert, Oriented x 3, No new F.N deficits, Normal affect Powers.AT,PERRAL Supple Neck,No JVD, No cervical lymphadenopathy appriciated.  Symmetrical Chest wall movement, Good air movement bilaterally, CTAB RRR,No Gallops,Rubs or new Murmurs, No Parasternal Heave +ve B.Sounds, Abd Soft, Non tender, No organomegaly appriciated, No rebound -guarding or rigidity. No Cyanosis, Clubbing or edema, No new Rash or bruise   Data Review   CBC w Diff: Lab Results  Component Value Date   WBC 10.2 02/17/2016   HGB 15.1 02/17/2016   HCT 45.8 02/17/2016   PLT 233 02/17/2016   LYMPHOPCT 25 06/07/2015   MONOPCT 8 06/07/2015   EOSPCT 4 06/07/2015   BASOPCT 0 06/07/2015    CMP: Lab Results  Component Value Date   NA 139 02/17/2016   K 3.8 02/17/2016   CL 106 02/17/2016   CO2 25 02/17/2016   BUN 25 (H) 02/17/2016   CREATININE 1.48 (H) 02/17/2016   CREATININE 1.56 (H) 11/11/2013   PROT 6.4 (L) 06/07/2015   ALBUMIN 3.8 06/07/2015   BILITOT 0.4 06/07/2015   ALKPHOS 85 06/07/2015   AST 14 (L) 06/07/2015   ALT 14 (L) 06/07/2015  .   Total Time in preparing paper work, data evaluation and todays exam - 35 minutes  Thurnell Lose M.D on 02/17/2016 at 9:48 AM  [facility]   Office  [phone]

## 2016-02-28 ENCOUNTER — Encounter: Payer: Self-pay | Admitting: Family Medicine

## 2016-02-28 ENCOUNTER — Ambulatory Visit (INDEPENDENT_AMBULATORY_CARE_PROVIDER_SITE_OTHER): Payer: BLUE CROSS/BLUE SHIELD | Admitting: Family Medicine

## 2016-02-28 VITALS — BP 132/90 | Ht 66.5 in | Wt 218.0 lb

## 2016-02-28 DIAGNOSIS — I10 Essential (primary) hypertension: Secondary | ICD-10-CM | POA: Diagnosis not present

## 2016-02-28 DIAGNOSIS — R079 Chest pain, unspecified: Secondary | ICD-10-CM | POA: Diagnosis not present

## 2016-02-28 DIAGNOSIS — E119 Type 2 diabetes mellitus without complications: Secondary | ICD-10-CM | POA: Diagnosis not present

## 2016-02-28 MED ORDER — NITROGLYCERIN 0.4 MG SL SUBL: 0.4000 mg | SUBLINGUAL_TABLET | SUBLINGUAL | 0 refills | Status: DC | PRN

## 2016-02-28 MED ORDER — CARVEDILOL 6.25 MG PO TABS: 6.2500 mg | ORAL_TABLET | Freq: Two times a day (BID) | ORAL | 5 refills | Status: DC

## 2016-02-28 MED ORDER — ISOSORBIDE MONONITRATE ER 60 MG PO TB24: 60.0000 mg | ORAL_TABLET | Freq: Every day | ORAL | 5 refills | Status: DC

## 2016-02-28 NOTE — Progress Notes (Signed)
   Subjective:    Patient ID: Craig Knight, male    DOB: 06-11-1965, 51 y.o.   MRN: [id number]  HPI The patient comes in today for a wellness visit.However due to urgent concerns will delay wellness visit.    A review of their health history was completed.  A review of medications was also completed.  Any needed refills; pt states hospital changed meds and he is unsure of what he is taking. Did not bring meds or a med list with him  Eating habits: not health conscious  Falls/  MVA accidents in past few months: none  Regular exercise: none   Additional concerns: was admitted to hospital with htn.  Having sob. Patient was felt to have hypertensive emergency. Entire hospital records reviewed today in presence of patient. He did have one cardiac enzyme that was slightly elevated. He also has numerous risk factors. An EKG the rate field strain type pattern. He was discharged from the hospital and strongly encouraged to set up a stress test. He is also strongly encouraged to come see me after this. He did neither of these stains in instead waited until his "wellness visit" to come in and discuss a. X  On further history unfortunately the patient was given numerous blood pressure medications to help control his blood pressure and improve his cardiac status and risk, but he did not get any of these filled out  Currently the patient is not experiencing chest pain or pressure with exertion but he does note some shortness of breath. Patient also notes he is not exercising these days at all  Pt states he was told he needs to set up a stress test.   Diabetes. A1C done at hospital.     Review of Systems No headache, no major weight loss or weight gain, no chest pain no back pain abdominal pain no change in bowel habits complete ROS otherwise negative     Objective:   Physical Exam Alert vitals stable, NAD. Blood pressure on repeat 168/90 Blood pressure good on repeat. HEENT normal. Lungs  clear. Heart regular rate and rhythm.        Assessment & Plan:  Impression 1 potential unstable angina equivalent with numerous risk factors #2 hypertension poor control with suboptimum compliance plan cardiology referral soon, encouraged to take aspirin daily, encouraged to initiate medications as recommended at hospital discharge 2 weeks ago. Warning signs discussed carefully, follow-up as scheduled

## 2016-02-28 NOTE — Patient Instructions (Addendum)
Start daily full strength aspirin 325 mg daily  PLEASE start meds as prescribed by the hospital doctors two weeks ago to try to help your blood pressure  We will work on a cardiology referral

## 2016-03-05 ENCOUNTER — Ambulatory Visit (INDEPENDENT_AMBULATORY_CARE_PROVIDER_SITE_OTHER): Payer: BLUE CROSS/BLUE SHIELD | Admitting: Adult Health

## 2016-03-05 ENCOUNTER — Encounter: Payer: Self-pay | Admitting: Adult Health

## 2016-03-05 VITALS — BP 182/110 | HR 70 | Ht 71.0 in | Wt 222.0 lb

## 2016-03-05 DIAGNOSIS — I10 Essential (primary) hypertension: Secondary | ICD-10-CM

## 2016-03-05 MED ORDER — HYDRALAZINE HCL 25 MG PO TABS
50.0000 mg | ORAL_TABLET | Freq: Three times a day (TID) | ORAL | 3 refills | Status: DC
Start: 2016-03-05 — End: 2016-04-01

## 2016-03-05 MED ORDER — LOSARTAN POTASSIUM 25 MG PO TABS: 25.0000 mg | ORAL_TABLET | Freq: Every day | ORAL | 5 refills | Status: DC

## 2016-03-05 MED ORDER — CHLORTHALIDONE 25 MG PO TABS: 25.0000 mg | ORAL_TABLET | Freq: Every day | ORAL | 2 refills | Status: DC

## 2016-03-05 MED ORDER — AMLODIPINE BESYLATE 10 MG PO TABS: 10.0000 mg | ORAL_TABLET | Freq: Every day | ORAL | 11 refills | Status: DC

## 2016-03-05 NOTE — Progress Notes (Signed)
Name: Craig Knight    DOB: 05/07/1965  Age: 51 y.o.  MR#: [id number]       PCP:  [name], MD      Insurance: Payor: [place] / Plan: [place] / Product Type: *No Product type* /   CC:   No chief complaint on file.   VS Vitals:   03/05/16 1543  BP: (!) 182/110  Pulse: 70  SpO2: 96%  Weight: 222 lb (100.7 kg)  Height: 5\' 11"  (1.803 m)    Weights Current Weight  03/05/16 222 lb (100.7 kg)  02/28/16 218 lb (98.9 kg)  02/17/16 215 lb 14.4 oz (97.9 kg)    Blood Pressure  BP Readings from Last 3 Encounters:  03/05/16 (!) 182/110  02/28/16 132/90  02/17/16 (!) 140/94     Admit date:  (Not on file) Last encounter with RMR:  Visit date not found   Allergy Dust mite extract and Lisinopril  Current Outpatient Prescriptions  Medication Sig Dispense Refill  . albuterol (PROVENTIL HFA;VENTOLIN HFA) 108 (90 Base) MCG/ACT inhaler Inhale 2 puffs into the lungs every 6 (six) hours as needed for wheezing or shortness of breath. 1 Inhaler 0  . aspirin EC 81 MG tablet Take 1 tablet (81 mg total) by mouth daily. (Patient taking differently: Take 81 mg by mouth 2 (two) times daily. )    . carvedilol (COREG) 6.25 MG tablet Take 1 tablet (6.25 mg total) by mouth 2 (two) times daily with a meal. 60 tablet 5  . isosorbide mononitrate (IMDUR) 60 MG 24 hr tablet Take 1 tablet (60 mg total) by mouth daily. 30 tablet 5  . losartan (COZAAR) 25 MG tablet Take 1 tablet (25 mg total) by mouth daily. 30 tablet 5  . metFORMIN (GLUCOPHAGE) 500 MG tablet Take 1 tablet (500 mg total) by mouth daily with breakfast. 30 tablet 5  . nitroGLYCERIN (NITROSTAT) 0.4 MG SL tablet Place 1 tablet (0.4 mg total) under the tongue every 5 (five) minutes as needed for chest pain. 30 tablet 0  . amLODipine (NORVASC) 10 MG tablet Take 1 tablet (10 mg total) by mouth daily. (Patient not taking: Reported on 03/05/2016) 30 tablet 0  . chlorthalidone (HYGROTON) 25 MG tablet Take 1 tablet (25 mg total) by mouth  daily. (Patient not taking: Reported on 03/05/2016) 90 tablet 1   No current facility-administered medications for this visit.     Discontinued Meds:   There are no discontinued medications.  Patient Active Problem List   Diagnosis Date Noted  . Hypertensive emergency 02/16/2016  . Hypertensive urgency 06/07/2015  . CKD (chronic kidney disease), stage III 06/07/2015  . Pain in the chest   . Elevated troponin   . Chronic diastolic CHF (congestive heart failure) ([place]) 09/17/2013  . DM type 2 (diabetes mellitus, type 2) ([place]) 09/15/2013  . Chest pain 08/01/2011  . HTN (hypertension) 08/01/2011  . Chest pain 07/22/2011  . Renal insufficiency 07/22/2011  . Tobacco abuse 07/22/2011  . Hypertension     LABS    Component Value Date/Time   NA 139 02/17/2016 0620   NA 139 02/16/2016 0800   NA 141 06/07/2015 0235   K 3.8 02/17/2016 0620   K 3.9 02/16/2016 0800   K 4.1 06/07/2015 0235   CL 106 02/17/2016 0620   CL 107 02/16/2016 0800   CL 108 06/07/2015 0235   CO2 25 02/17/2016 0620   CO2 26 02/16/2016 0800   CO2 27 06/07/2015 0235  GLUCOSE 107 (H) 02/17/2016 0620   GLUCOSE 99 02/16/2016 0800   GLUCOSE 109 (H) 06/07/2015 0235   BUN 25 (H) 02/17/2016 0620   BUN 22 (H) 02/16/2016 0800   BUN 20 06/07/2015 0235   CREATININE 1.48 (H) 02/17/2016 0620   CREATININE 1.36 (H) 02/16/2016 0800   CREATININE 1.54 (H) 06/07/2015 0235   CREATININE 1.56 (H) 11/11/2013 1437   CALCIUM 9.0 02/17/2016 0620   CALCIUM 9.8 02/16/2016 0800   CALCIUM 9.1 06/07/2015 0235   GFRNONAA 53 (L) 02/17/2016 0620   GFRNONAA 59 (L) 02/16/2016 0800   GFRNONAA 51 (L) 06/07/2015 0235   GFRAA >60 02/17/2016 0620   GFRAA >60 02/16/2016 0800   GFRAA 59 (L) 06/07/2015 0235   CMP     Component Value Date/Time   NA 139 02/17/2016 0620   K 3.8 02/17/2016 0620   CL 106 02/17/2016 0620   CO2 25 02/17/2016 0620   GLUCOSE 107 (H) 02/17/2016 0620   BUN 25 (H) 02/17/2016 0620   CREATININE 1.48 (H) 02/17/2016 0620    CREATININE 1.56 (H) 11/11/2013 1437   CALCIUM 9.0 02/17/2016 0620   PROT 6.4 (L) 06/07/2015 0235   ALBUMIN 3.8 06/07/2015 0235   AST 14 (L) 06/07/2015 0235   ALT 14 (L) 06/07/2015 0235   ALKPHOS 85 06/07/2015 0235   BILITOT 0.4 06/07/2015 0235   GFRNONAA 53 (L) 02/17/2016 0620   GFRAA >60 02/17/2016 0620       Component Value Date/Time   WBC 10.2 02/17/2016 0620   WBC 9.9 02/16/2016 0800   WBC 9.0 06/07/2015 0235   HGB 15.1 02/17/2016 0620   HGB 16.1 02/16/2016 0800   HGB 14.3 06/07/2015 0235   HCT 45.8 02/17/2016 0620   HCT 46.5 02/16/2016 0800   HCT 42.4 06/07/2015 0235   MCV 81.1 02/17/2016 0620   MCV 80.3 02/16/2016 0800   MCV 80.2 06/07/2015 0235    Lipid Panel     Component Value Date/Time   CHOL 157 06/07/2015 0815   TRIG 85 06/07/2015 0815   HDL 34 (L) 06/07/2015 0815   CHOLHDL 4.6 06/07/2015 0815   VLDL 17 06/07/2015 0815   LDLCALC 106 (H) 06/07/2015 0815    ABG No results found for: PHART, PCO2ART, PO2ART, HCO3, TCO2, ACIDBASEDEF, O2SAT   Lab Results  Component Value Date   TSH 1.790 09/16/2013   BNP (last 3 results)  Recent Labs  06/07/15 0115  BNP 48.0    ProBNP (last 3 results) No results for input(s): PROBNP in the last 8760 hours.  Cardiac Panel (last 3 results) No results for input(s): CKTOTAL, CKMB, TROPONINI, RELINDX in the last 72 hours.  Iron/TIBC/Ferritin/ %Sat No results found for: IRON, TIBC, FERRITIN, IRONPCTSAT   EKG Orders placed or performed during the hospital encounter of 02/16/16  . EKG 12-Lead  . EKG 12-Lead  . ED EKG within 10 minutes  . ED EKG within 10 minutes  . EKG     Prior Assessment and Plan Problem List as of 03/05/2016 Reviewed: 02/16/2016  9:32 AM by Thurnell Lose, MD     Cardiovascular and Mediastinum   Hypertension   HTN (hypertension)   Chronic diastolic CHF (congestive heart failure) ([place])   Hypertensive urgency   Hypertensive emergency     Endocrine   DM type 2 (diabetes mellitus, type  2) ([place])     Genitourinary   Renal insufficiency   CKD (chronic kidney disease), stage III     Other   Chest pain  Tobacco abuse   Chest pain   Pain in the chest   Elevated troponin       Imaging: Dg Chest 2 View  Result Date: 02/16/2016 CLINICAL DATA:  Mid chest pain for 3-4 days. Cough and shortness of breath. EXAM: CHEST  2 VIEW COMPARISON:  06/07/2015 FINDINGS: The lungs are clear wiithout focal pneumonia, edema, pneumothorax or pleural effusion. Cardiopericardial silhouette is at upper limits of normal for size. The visualized bony structures of the thorax are intact. Telemetry leads overlie the chest. IMPRESSION: Stable.  No acute cardiopulmonary findings. Electronically Signed   By: Misty Stanley M.D.   On: 02/16/2016 08:51

## 2016-03-05 NOTE — Progress Notes (Signed)
Cardiology Office Note   Date:  03/05/2016   ID:  Craig Knight, DOB [birth year], MRN [id number]  PCP:  [name], MD  Cardiologist: Cloria Spring, NP   Chief Complaint  Patient presents with  . Hypertension     History of Present Illness: Craig Knight is a 51 y.o. male who presents for ongoing assessment and management of hypertension, chronic dyspnea on exertion, and recurrent chest pain with history of difficult to control hypertension. The patient was recently discharged from [facility] on 02/17/2016 in the setting of hypertensive urgency. The patient was started on amlodipine, carvedilol, Imdur, and losartan 25 mg daily. Other history includes diabetes and medical noncompliance.  Echocardiogram 02/16/2016. Left ventricle: The cavity size was normal. Wall thickness was   increased in a pattern of severe LVH. Systolic function was   normal. The estimated ejection fraction was in the range of 55%   to 60%. Wall motion was normal; there were no regional wall   motion abnormalities. Doppler parameters are consistent with   abnormal left ventricular relaxation (grade 1 diastolic   dysfunction). - Left atrium: The atrium was mildly dilated. - Right atrium: Central venous pressure (est): 3 mm Hg. - Atrial septum: No defect or patent foramen ovale was identified. - Tricuspid valve: There was trivial regurgitation. - Pulmonary arteries: Systolic pressure could not be accurately   estimated. - Pericardium, extracardiac: There was no pericardial effusion.  He comes today with dyspnea on exertion. He has not picked up all of his antihypertensive medications prescribed on discharge from the hospital. He is missing amlodipine, hydralazine, and chlorthalidone. He states when he went to pick it up it was not available for him. We checked with [facility] and they stated that he had not picked it up and it was waiting for him.  Past Medical History:  Diagnosis Date  .  Asthma   . Bronchitis   . Hypertension     Past Surgical History:  Procedure Laterality Date  . lipoma removal       Current Outpatient Prescriptions  Medication Sig Dispense Refill  . albuterol (PROVENTIL HFA;VENTOLIN HFA) 108 (90 Base) MCG/ACT inhaler Inhale 2 puffs into the lungs every 6 (six) hours as needed for wheezing or shortness of breath. 1 Inhaler 0  . aspirin EC 81 MG tablet Take 1 tablet (81 mg total) by mouth daily. (Patient taking differently: Take 81 mg by mouth 2 (two) times daily. )    . carvedilol (COREG) 6.25 MG tablet Take 1 tablet (6.25 mg total) by mouth 2 (two) times daily with a meal. 60 tablet 5  . isosorbide mononitrate (IMDUR) 60 MG 24 hr tablet Take 1 tablet (60 mg total) by mouth daily. 30 tablet 5  . losartan (COZAAR) 25 MG tablet Take 1 tablet (25 mg total) by mouth daily. 30 tablet 5  . metFORMIN (GLUCOPHAGE) 500 MG tablet Take 1 tablet (500 mg total) by mouth daily with breakfast. 30 tablet 5  . nitroGLYCERIN (NITROSTAT) 0.4 MG SL tablet Place 1 tablet (0.4 mg total) under the tongue every 5 (five) minutes as needed for chest pain. 30 tablet 0  . amLODipine (NORVASC) 10 MG tablet Take 1 tablet (10 mg total) by mouth daily. 30 tablet 11  . chlorthalidone (HYGROTON) 25 MG tablet Take 1 tablet (25 mg total) by mouth daily. 90 tablet 2  . hydrALAZINE (APRESOLINE) 25 MG tablet Take 2 tablets (50 mg total) by mouth 3 (three) times daily. [place]  tablet 3   No current facility-administered medications for this visit.     Allergies:   Dust mite extract and Lisinopril    Social History:  The patient  reports that he quit smoking 8 days ago. His smoking use included Cigarettes. He has a 12.50 pack-year smoking history. He quit smokeless tobacco use about 7 months ago. He reports that he drinks alcohol. He reports that he does not use drugs.   Family History:  The patient's family history includes Heart attack (age of onset: 12) in his father; Hypertension in his  brother and sister; Stroke in his brother and mother.    ROS: All other systems are reviewed and negative. Unless otherwise mentioned in H&P    PHYSICAL EXAM: VS:  BP (!) 182/110   Pulse 70   Ht 5\' 11"  (1.803 m)   Wt 222 lb (100.7 kg)   SpO2 96%   BMI 30.96 kg/m  , BMI Body mass index is 30.96 kg/m. GEN: Well nourished, well developed, in no acute distress  HEENT: normal  Neck: no JVD, carotid bruits, or masses Cardiac: RRR; S4 murmur,no  rubs, or gallops,no edema  Respiratory:  clear to auscultation bilaterally, normal work of breathing GI: soft, nontender, nondistended, + BS MS: no deformity or atrophy  Skin: warm and dry, no rash Neuro:  Strength and sensation are intact Psych: euthymic mood, full affect   Recent Labs: 06/07/2015: ALT 14; B Natriuretic Peptide 48.0 02/17/2016: BUN 25; Creatinine, Ser 1.48; Hemoglobin 15.1; Platelets 233; Potassium 3.8; Sodium 139    Lipid Panel    Component Value Date/Time   CHOL 157 06/07/2015 0815   TRIG 85 06/07/2015 0815   HDL 34 (L) 06/07/2015 0815   CHOLHDL 4.6 06/07/2015 0815   VLDL 17 06/07/2015 0815   LDLCALC 106 (H) 06/07/2015 0815      Wt Readings from Last 3 Encounters:  03/05/16 222 lb (100.7 kg)  02/28/16 218 lb (98.9 kg)  02/17/16 215 lb 14.4 oz (97.9 kg)     ASSESSMENT AND PLAN:  1. Hypertension: Not well controlled. He is not been taking all his medications that she states that they were not ready for him when he went to the pharmacy. We have called [facility] and there waiting for him to pick up. I've advised him to go pick up amlodipine, chlorthalidone, and hydralazine 3 times a day. We'll see him in a week to evaluate his blood pressure response to taking medications as directed. We have had a lengthy discussion concerning it effects on kidneys, eyes, heart function, and overall health status.  I will not plan any type of cardiac stress testing until blood pressure is well-controlled. I believe his dyspnea is  related to elevated pressure. We will follow him closely in one week to evaluate his status. He has been counseled on low sodium diet.   Current medicines are reviewed at length with the patient today.    Labs/ tests ordered today include:  No orders of the defined types were placed in this encounter.    Disposition:   FU with one week. Signed, [name], NP  03/05/2016 4:52 PM    [facility] [address] [place], [place] [address] Phone: [phone]; Fax: [phone]

## 2016-03-05 NOTE — Patient Instructions (Signed)
Your physician recommends that you schedule a follow-up appointment in: 1 Week   Your physician recommends that you continue on your current medications as directed. Please refer to the Current Medication list given to you today.  If you need a refill on your cardiac medications before your next appointment, please call your pharmacy.  Thank you for choosing [facility]!

## 2016-03-11 ENCOUNTER — Encounter (HOSPITAL_COMMUNITY): Payer: Self-pay | Admitting: Emergency Medicine

## 2016-03-11 ENCOUNTER — Emergency Department (HOSPITAL_COMMUNITY)
Admission: EM | Admit: 2016-03-11 | Discharge: 2016-03-11 | Disposition: A | Discharge status: Home / Self Care | Payer: BLUE CROSS/BLUE SHIELD | Attending: Emergency Medicine | Admitting: Emergency Medicine

## 2016-03-11 DIAGNOSIS — N183 Chronic kidney disease, stage 3 (moderate): Secondary | ICD-10-CM | POA: Diagnosis not present

## 2016-03-11 DIAGNOSIS — Z79899 Other long term (current) drug therapy: Secondary | ICD-10-CM | POA: Insufficient documentation

## 2016-03-11 DIAGNOSIS — Z7982 Long term (current) use of aspirin: Secondary | ICD-10-CM | POA: Insufficient documentation

## 2016-03-11 DIAGNOSIS — I5032 Chronic diastolic (congestive) heart failure: Secondary | ICD-10-CM | POA: Insufficient documentation

## 2016-03-11 DIAGNOSIS — R42 Dizziness and giddiness: Secondary | ICD-10-CM | POA: Diagnosis not present

## 2016-03-11 DIAGNOSIS — J45909 Unspecified asthma, uncomplicated: Secondary | ICD-10-CM | POA: Diagnosis not present

## 2016-03-11 DIAGNOSIS — R51 Headache: Secondary | ICD-10-CM | POA: Insufficient documentation

## 2016-03-11 DIAGNOSIS — F17210 Nicotine dependence, cigarettes, uncomplicated: Secondary | ICD-10-CM | POA: Insufficient documentation

## 2016-03-11 DIAGNOSIS — I130 Hypertensive heart and chronic kidney disease with heart failure and stage 1 through stage 4 chronic kidney disease, or unspecified chronic kidney disease: Secondary | ICD-10-CM | POA: Diagnosis not present

## 2016-03-11 DIAGNOSIS — Z7984 Long term (current) use of oral hypoglycemic drugs: Secondary | ICD-10-CM | POA: Insufficient documentation

## 2016-03-11 DIAGNOSIS — R519 Headache, unspecified: Secondary | ICD-10-CM

## 2016-03-11 DIAGNOSIS — H539 Unspecified visual disturbance: Secondary | ICD-10-CM | POA: Diagnosis not present

## 2016-03-11 MED ORDER — KETOROLAC TROMETHAMINE 30 MG/ML IJ SOLN
15.0000 mg | Freq: Once | INTRAMUSCULAR | Status: AC
  Administered 2016-03-11: 15 mg via INTRAVENOUS
  Filled 2016-03-11: qty 1

## 2016-03-11 MED ORDER — PROMETHAZINE HCL 25 MG/ML IJ SOLN
25.0000 mg | Freq: Once | INTRAMUSCULAR | Status: AC
  Administered 2016-03-11: 25 mg via INTRAVENOUS
  Filled 2016-03-11: qty 1

## 2016-03-11 NOTE — ED Triage Notes (Signed)
Patient complaining of headache x 2 weeks with vomiting "off and on."

## 2016-03-11 NOTE — ED Provider Notes (Signed)
[facility] DEPT Provider Note   CSN: [id number] Arrival date & time: 03/11/16  1042  By signing my name below, I, Craig Knight, attest that this documentation has been prepared under the direction and in the presence of Davonna Belling, MD . Electronically Signed: Evelene Knight, Scribe. 03/11/2016. 12:08 PM.  History   Chief Complaint Chief Complaint  Patient presents with  . Headache    The history is provided by the patient. No language interpreter was used.     HPI Comments:  Craig Knight is a 51 y.o. male with a history of HTN, who presents to the Emergency Department complaining of an intermittent HA with pain diffusely throughout x 3-4 days. No recent fall, head injury or trauma. He has been taking Sudafed without relief. He has a h/o similar HAs with elevated BP. He notes associated nausea, photophobia, and mild lightheadedness. He has also been seeing "floating dots".  He  denies fever, chills, or weakness.   Past Medical History:  Diagnosis Date  . Asthma   . Bronchitis   . Hypertension     Patient Active Problem List   Diagnosis Date Noted  . Hypertensive emergency 02/16/2016  . Hypertensive urgency 06/07/2015  . CKD (chronic kidney disease), stage III 06/07/2015  . Pain in the chest   . Elevated troponin   . Chronic diastolic CHF (congestive heart failure) ([place]) 09/17/2013  . DM type 2 (diabetes mellitus, type 2) ([place]) 09/15/2013  . Chest pain 08/01/2011  . HTN (hypertension) 08/01/2011  . Chest pain 07/22/2011  . Renal insufficiency 07/22/2011  . Tobacco abuse 07/22/2011  . Hypertension     Past Surgical History:  Procedure Laterality Date  . lipoma removal         Home Medications    Prior to Admission medications   Medication Sig Start Date End Date Taking? Authorizing Provider  albuterol (PROVENTIL HFA;VENTOLIN HFA) 108 (90 Base) MCG/ACT inhaler Inhale 2 puffs into the lungs every 6 (six) hours as needed for wheezing or shortness of  breath. 06/08/15  Yes Samuella Cota, MD  amLODipine (NORVASC) 10 MG tablet Take 1 tablet (10 mg total) by mouth daily. 03/05/16  Yes Lendon Colonel, NP  aspirin EC 81 MG tablet Take 1 tablet (81 mg total) by mouth daily. 10/13/13  Yes Arnoldo Lenis, MD  carvedilol (COREG) 6.25 MG tablet Take 1 tablet (6.25 mg total) by mouth 2 (two) times daily with a meal. 02/28/16  Yes Mikey Kirschner, MD  chlorthalidone (HYGROTON) 25 MG tablet Take 1 tablet (25 mg total) by mouth daily. 03/05/16  Yes Lendon Colonel, NP  hydrALAZINE (APRESOLINE) 25 MG tablet Take 2 tablets (50 mg total) by mouth 3 (three) times daily. Patient taking differently: Take 25 mg by mouth 3 (three) times daily.  03/05/16 06/03/16 Yes Lendon Colonel, NP  isosorbide mononitrate (IMDUR) 60 MG 24 hr tablet Take 1 tablet (60 mg total) by mouth daily. 02/28/16  Yes Mikey Kirschner, MD  losartan (COZAAR) 25 MG tablet Take 1 tablet (25 mg total) by mouth daily. 03/05/16  Yes Lendon Colonel, NP  metFORMIN (GLUCOPHAGE) 500 MG tablet Take 1 tablet (500 mg total) by mouth daily with breakfast. 10/25/15  Yes Mikey Kirschner, MD  nitroGLYCERIN (NITROSTAT) 0.4 MG SL tablet Place 1 tablet (0.4 mg total) under the tongue every 5 (five) minutes as needed for chest pain. 02/28/16  Yes Mikey Kirschner, MD    Family History Family History  Problem  Relation Age of Onset  . Stroke Mother   . Heart attack Father 48    CABG  . Hypertension Sister   . Stroke Brother   . Hypertension Brother     Social History Social History  Substance Use Topics  . Smoking status: Current Every Day Smoker    Packs/day: 0.50    Years: 25.00    Types: Cigarettes    Last attempt to quit: 02/26/2016  . Smokeless tobacco: Former Systems developer    Quit date: 07/11/2015  . Alcohol use 0.0 oz/week     Comment: occasionally     Allergies   Dust mite extract and Lisinopril   Review of Systems Review of Systems  Constitutional: Negative for chills and fever.  Eyes:  Positive for photophobia and visual disturbance.  Respiratory: Negative for shortness of breath.   Cardiovascular: Negative for chest pain.  Gastrointestinal: Negative for vomiting.  Neurological: Positive for light-headedness and headaches. Negative for weakness.  All other systems reviewed and are negative.   Physical Exam Updated Vital Signs BP 119/70 (BP Location: Left Arm)   Pulse 80   Temp 97.4 F (36.3 C) (Oral)   Resp 18   Ht 5\' 7"  (1.702 m)   Wt 222 lb (100.7 kg)   SpO2 98%   BMI 34.77 kg/m   Physical Exam  Constitutional: He is oriented to person, place, and time. He appears well-developed and well-nourished.  Appears uncomfortable    HENT:  Head: Normocephalic and atraumatic.  Eyes: EOM are normal.  Eye movement intact  Neck: Normal range of motion.  Cardiovascular: Normal rate, regular rhythm, normal heart sounds and intact distal pulses.   Pulmonary/Chest: Effort normal and breath sounds normal. No respiratory distress.  Abdominal: Soft. He exhibits no distension. There is no tenderness.  Musculoskeletal: Normal range of motion.  Neurological: He is alert and oriented to person, place, and time.  Good grips  Moving all extremities   Skin: Skin is warm and dry.  Psychiatric: He has a normal mood and affect. Judgment normal.  Nursing note and vitals reviewed.    ED Treatments / Results  COORDINATION OF CARE:  12:03 PM Discussed treatment plan with pt at bedside and pt agreed to plan.  Labs (all labs ordered are listed, but only abnormal results are displayed) Labs Reviewed - No data to display  EKG  EKG Interpretation None       Radiology No results found.  Procedures Procedures (including critical care time)  Medications Ordered in ED Medications  promethazine (PHENERGAN) injection 25 mg (25 mg Intravenous Given 03/11/16 1314)  ketorolac (TORADOL) 30 MG/ML injection 15 mg (15 mg Intravenous Given 03/11/16 1315)     Initial Impression /  Assessment and Plan / ED Course  I have reviewed the triage vital signs and the nursing notes.  Pertinent labs & imaging results that were available during my care of the patient were reviewed by me and considered in my medical decision making (see chart for details).  Clinical Course     Patient with headache. Some nonspecific vision changes with it. Recently her blood pressure medicines adjusted. Nonfocal exam. Make the same migraine variant. Feels better after treatment. Discussed with family members. Will discharge home. Has had previous CT scans of his head for similar headaches.    Final Clinical Impressions(s) / ED Diagnoses   Final diagnoses:  Acute nonintractable headache, unspecified headache type    New Prescriptions New Prescriptions   No medications on file  I personally performed the services described in this documentation, which was scribed in my presence. The recorded information has been reviewed and is accurate.       Davonna Belling, MD 03/11/16 [phone]

## 2016-03-11 NOTE — ED Notes (Signed)
ED Provider at bedside. 

## 2016-03-14 ENCOUNTER — Ambulatory Visit: Payer: BLUE CROSS/BLUE SHIELD | Admitting: Adult Health

## 2016-03-19 ENCOUNTER — Encounter: Payer: Self-pay | Admitting: Adult Health

## 2016-03-19 ENCOUNTER — Ambulatory Visit (INDEPENDENT_AMBULATORY_CARE_PROVIDER_SITE_OTHER): Payer: BLUE CROSS/BLUE SHIELD | Admitting: Adult Health

## 2016-03-19 ENCOUNTER — Encounter: Payer: Self-pay | Admitting: *Deleted

## 2016-03-19 VITALS — BP 138/78 | HR 70 | Ht 68.0 in | Wt 217.0 lb

## 2016-03-19 DIAGNOSIS — R0789 Other chest pain: Secondary | ICD-10-CM

## 2016-03-19 DIAGNOSIS — I10 Essential (primary) hypertension: Secondary | ICD-10-CM | POA: Diagnosis not present

## 2016-03-19 DIAGNOSIS — R0609 Other forms of dyspnea: Secondary | ICD-10-CM | POA: Diagnosis not present

## 2016-03-19 DIAGNOSIS — Z720 Tobacco use: Secondary | ICD-10-CM

## 2016-03-19 DIAGNOSIS — R0600 Dyspnea, unspecified: Secondary | ICD-10-CM

## 2016-03-19 NOTE — Patient Instructions (Signed)
Your physician recommends that you schedule a follow-up appointment after test are complete.   Your physician recommends that you continue on your current medications as directed. Please refer to the Current Medication list given to you today.  Your physician has requested that you have en exercise stress myoview. For further information please visit [email or web address]. Please follow instruction sheet, as given.  Your physician recommends that you return for lab work on the day of your stress test.   Do not take Coreg the night before or the morning of your stress test.   If you need a refill on your cardiac medications before your next appointment, please call your pharmacy.  Thank you for choosing [facility]!

## 2016-03-19 NOTE — Progress Notes (Signed)
Name: Craig Knight    DOB: February 19, 1966  Age: 51 y.o.  MR#: [id number]       PCP:  [name], MD      Insurance: Payor: BLUE [place] / Plan: BCBS OTHER / Product Type: *No Product type* /   CC:    Chief Complaint  Patient presents with  . Hypertension    VS Vitals:   03/19/16 1436  BP: 138/78  Pulse: 70  SpO2: 95%  Weight: 217 lb (98.4 kg)  Height: 5\' 8"  (1.727 m)    Weights Current Weight  03/19/16 217 lb (98.4 kg)  03/11/16 222 lb (100.7 kg)  03/05/16 222 lb (100.7 kg)    Blood Pressure  BP Readings from Last 3 Encounters:  03/19/16 138/78  03/11/16 105/73  03/05/16 (!) 182/110     Admit date:  (Not on file) Last encounter with RMR:  03/05/2016   Allergy Dust mite extract and Lisinopril  Current Outpatient Prescriptions  Medication Sig Dispense Refill  . albuterol (PROVENTIL HFA;VENTOLIN HFA) 108 (90 Base) MCG/ACT inhaler Inhale 2 puffs into the lungs every 6 (six) hours as needed for wheezing or shortness of breath. 1 Inhaler 0  . amLODipine (NORVASC) 10 MG tablet Take 1 tablet (10 mg total) by mouth daily. 30 tablet 11  . aspirin EC 81 MG tablet Take 1 tablet (81 mg total) by mouth daily.    . carvedilol (COREG) 6.25 MG tablet Take 1 tablet (6.25 mg total) by mouth 2 (two) times daily with a meal. 60 tablet 5  . chlorthalidone (HYGROTON) 25 MG tablet Take 1 tablet (25 mg total) by mouth daily. 90 tablet 2  . hydrALAZINE (APRESOLINE) 25 MG tablet Take 2 tablets (50 mg total) by mouth 3 (three) times daily. (Patient taking differently: Take 25 mg by mouth 3 (three) times daily. ) 270 tablet 3  . isosorbide mononitrate (IMDUR) 60 MG 24 hr tablet Take 1 tablet (60 mg total) by mouth daily. 30 tablet 5  . losartan (COZAAR) 25 MG tablet Take 1 tablet (25 mg total) by mouth daily. 30 tablet 5  . metFORMIN (GLUCOPHAGE) 500 MG tablet Take 1 tablet (500 mg total) by mouth daily with breakfast. 30 tablet 5  . nitroGLYCERIN (NITROSTAT) 0.4 MG SL tablet Place 1  tablet (0.4 mg total) under the tongue every 5 (five) minutes as needed for chest pain. 30 tablet 0   No current facility-administered medications for this visit.     Discontinued Meds:   There are no discontinued medications.  Patient Active Problem List   Diagnosis Date Noted  . Hypertensive emergency 02/16/2016  . Hypertensive urgency 06/07/2015  . CKD (chronic kidney disease), stage III 06/07/2015  . Pain in the chest   . Elevated troponin   . Chronic diastolic CHF (congestive heart failure) ([place]) 09/17/2013  . DM type 2 (diabetes mellitus, type 2) ([place]) 09/15/2013  . Chest pain 08/01/2011  . HTN (hypertension) 08/01/2011  . Chest pain 07/22/2011  . Renal insufficiency 07/22/2011  . Tobacco abuse 07/22/2011  . Hypertension     LABS    Component Value Date/Time   NA 139 02/17/2016 0620   NA 139 02/16/2016 0800   NA 141 06/07/2015 0235   K 3.8 02/17/2016 0620   K 3.9 02/16/2016 0800   K 4.1 06/07/2015 0235   CL 106 02/17/2016 0620   CL 107 02/16/2016 0800   CL 108 06/07/2015 0235   CO2 25 02/17/2016 0620   CO2 26  02/16/2016 0800   CO2 27 06/07/2015 0235   GLUCOSE 107 (H) 02/17/2016 0620   GLUCOSE 99 02/16/2016 0800   GLUCOSE 109 (H) 06/07/2015 0235   BUN 25 (H) 02/17/2016 0620   BUN 22 (H) 02/16/2016 0800   BUN 20 06/07/2015 0235   CREATININE 1.48 (H) 02/17/2016 0620   CREATININE 1.36 (H) 02/16/2016 0800   CREATININE 1.54 (H) 06/07/2015 0235   CREATININE 1.56 (H) 11/11/2013 1437   CALCIUM 9.0 02/17/2016 0620   CALCIUM 9.8 02/16/2016 0800   CALCIUM 9.1 06/07/2015 0235   GFRNONAA 53 (L) 02/17/2016 0620   GFRNONAA 59 (L) 02/16/2016 0800   GFRNONAA 51 (L) 06/07/2015 0235   GFRAA >60 02/17/2016 0620   GFRAA >60 02/16/2016 0800   GFRAA 59 (L) 06/07/2015 0235   CMP     Component Value Date/Time   NA 139 02/17/2016 0620   K 3.8 02/17/2016 0620   CL 106 02/17/2016 0620   CO2 25 02/17/2016 0620   GLUCOSE 107 (H) 02/17/2016 0620   BUN 25 (H) 02/17/2016 0620    CREATININE 1.48 (H) 02/17/2016 0620   CREATININE 1.56 (H) 11/11/2013 1437   CALCIUM 9.0 02/17/2016 0620   PROT 6.4 (L) 06/07/2015 0235   ALBUMIN 3.8 06/07/2015 0235   AST 14 (L) 06/07/2015 0235   ALT 14 (L) 06/07/2015 0235   ALKPHOS 85 06/07/2015 0235   BILITOT 0.4 06/07/2015 0235   GFRNONAA 53 (L) 02/17/2016 0620   GFRAA >60 02/17/2016 0620       Component Value Date/Time   WBC 10.2 02/17/2016 0620   WBC 9.9 02/16/2016 0800   WBC 9.0 06/07/2015 0235   HGB 15.1 02/17/2016 0620   HGB 16.1 02/16/2016 0800   HGB 14.3 06/07/2015 0235   HCT 45.8 02/17/2016 0620   HCT 46.5 02/16/2016 0800   HCT 42.4 06/07/2015 0235   MCV 81.1 02/17/2016 0620   MCV 80.3 02/16/2016 0800   MCV 80.2 06/07/2015 0235    Lipid Panel     Component Value Date/Time   CHOL 157 06/07/2015 0815   TRIG 85 06/07/2015 0815   HDL 34 (L) 06/07/2015 0815   CHOLHDL 4.6 06/07/2015 0815   VLDL 17 06/07/2015 0815   LDLCALC 106 (H) 06/07/2015 0815    ABG No results found for: PHART, PCO2ART, PO2ART, HCO3, TCO2, ACIDBASEDEF, O2SAT   Lab Results  Component Value Date   TSH 1.790 09/16/2013   BNP (last 3 results)  Recent Labs  06/07/15 0115  BNP 48.0    ProBNP (last 3 results) No results for input(s): PROBNP in the last 8760 hours.  Cardiac Panel (last 3 results) No results for input(s): CKTOTAL, CKMB, TROPONINI, RELINDX in the last 72 hours.  Iron/TIBC/Ferritin/ %Sat No results found for: IRON, TIBC, FERRITIN, IRONPCTSAT   EKG Orders placed or performed during the hospital encounter of 02/16/16  . EKG 12-Lead  . EKG 12-Lead  . ED EKG within 10 minutes  . ED EKG within 10 minutes  . EKG     Prior Assessment and Plan Problem List as of 03/19/2016 Reviewed: 03/05/2016  4:56 PM by [name], NP     Cardiovascular and Mediastinum   Hypertension   HTN (hypertension)   Chronic diastolic CHF (congestive heart failure) ([place])   Hypertensive urgency   Hypertensive emergency     Endocrine    DM type 2 (diabetes mellitus, type 2) ([place])     Genitourinary   Renal insufficiency   CKD (chronic kidney disease), stage III  Other   Chest pain   Tobacco abuse   Chest pain   Pain in the chest   Elevated troponin       Imaging: No results found.

## 2016-03-19 NOTE — Progress Notes (Signed)
Cardiology Office Note   Date:  03/19/2016   ID:  Craig Knight, DOB 1965/05/07, MRN [id number]  PCP:  [name], MD  Cardiologist: Cloria Spring, NP   Chief Complaint  Patient presents with  . Hypertension      History of Present Illness: Craig Knight is a 51 y.o. male who presents for ongoing assessment and management of hypertension, with other history to include chronic dyspnea on exertion, chronic chest discomfort, and medical noncompliance. The patient was last in the office on 03/05/2016. He had not act up all of his medications from the pharmacy when we saw him last. This included amlodipine, chlorthalidone, and hydralazine 3 times a day. We asked him to go by pick them up and take them and return to the office today for reevaluation of blood pressure control.  He is now taking medications as directed with exception of hydralazine. He was placed on 50 mg 3 times a day but is only taking 25 mg 3 times a day. His blood pressure is much better controlled. He denies any further chest pain. He is complaining of some left arm numbness and tingling and some neck pain. He is anxious to proceed with his stress test to evaluate for ischemia. 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 He is waiting for stress test results to allow him to return. This is per his employer. His main complaint now is dyspnea.  Past Medical History:  Diagnosis Date  . Asthma   . Bronchitis   . Hypertension     Past Surgical History:  Procedure Laterality Date  . lipoma removal       Current Outpatient Prescriptions  Medication Sig Dispense Refill  . albuterol (PROVENTIL HFA;VENTOLIN HFA) 108 (90 Base) MCG/ACT inhaler Inhale 2 puffs into the lungs every 6 (six) hours as needed for wheezing or shortness of breath. 1 Inhaler 0  . amLODipine (NORVASC) 10 MG tablet Take 1 tablet (10 mg total) by mouth daily. 30 tablet 11  . aspirin EC 81 MG tablet Take 1 tablet (81 mg total) by mouth  daily.    . carvedilol (COREG) 6.25 MG tablet Take 1 tablet (6.25 mg total) by mouth 2 (two) times daily with a meal. 60 tablet 5  . chlorthalidone (HYGROTON) 25 MG tablet Take 1 tablet (25 mg total) by mouth daily. 90 tablet 2  . hydrALAZINE (APRESOLINE) 25 MG tablet Take 2 tablets (50 mg total) by mouth 3 (three) times daily. (Patient taking differently: Take 25 mg by mouth 3 (three) times daily. ) 270 tablet 3  . isosorbide mononitrate (IMDUR) 60 MG 24 hr tablet Take 1 tablet (60 mg total) by mouth daily. 30 tablet 5  . losartan (COZAAR) 25 MG tablet Take 1 tablet (25 mg total) by mouth daily. 30 tablet 5  . metFORMIN (GLUCOPHAGE) 500 MG tablet Take 1 tablet (500 mg total) by mouth daily with breakfast. 30 tablet 5  . nitroGLYCERIN (NITROSTAT) 0.4 MG SL tablet Place 1 tablet (0.4 mg total) under the tongue every 5 (five) minutes as needed for chest pain. 30 tablet 0   No current facility-administered medications for this visit.     Allergies:   Dust mite extract and Lisinopril    Social History:  The patient  reports that he quit smoking 2 days ago. His smoking use included Cigarettes. He has a 12.50 pack-year smoking history. He quit smokeless tobacco use about 8 months ago. He reports that he drinks alcohol.  He reports that he does not use drugs.   Family History:  The patient's family history includes Heart attack (age of onset: 72) in his father; Hypertension in his brother and sister; Stroke in his brother and mother.    ROS: All other systems are reviewed and negative. Unless otherwise mentioned in H&P    PHYSICAL EXAM: VS:  BP 138/78   Pulse 70   Ht 5\' 8"  (1.727 m)   Wt 217 lb (98.4 kg)   SpO2 95%   BMI 32.99 kg/m  , BMI Body mass index is 32.99 kg/m. GEN: Well nourished, well developed, in no acute distress  HEENT: normal  Neck: no JVD, carotid bruits, or masses Cardiac: RRR; no murmurs, rubs, or gallops,no edema  Respiratory:  clear to auscultation bilaterally, normal  work of breathing GI: soft, nontender, nondistended, + BS MS: no deformity or atrophy  Skin: warm and dry, no rash Neuro:  Strength and sensation are intact Psych: euthymic mood, full affect  Recent Labs: 06/07/2015: ALT 14; B Natriuretic Peptide 48.0 02/17/2016: BUN 25; Creatinine, Ser 1.48; Hemoglobin 15.1; Platelets 233; Potassium 3.8; Sodium 139    Lipid Panel    Component Value Date/Time   CHOL 157 06/07/2015 0815   TRIG 85 06/07/2015 0815   HDL 34 (L) 06/07/2015 0815   CHOLHDL 4.6 06/07/2015 0815   VLDL 17 06/07/2015 0815   LDLCALC 106 (H) 06/07/2015 0815      Wt Readings from Last 3 Encounters:  03/19/16 217 lb (98.4 kg)  03/11/16 222 lb (100.7 kg)  03/05/16 222 lb (100.7 kg)      Other studies Reviewed:  Echocardiogram 02/16/2016 Left ventricle: The cavity size was normal. Wall thickness was   increased in a pattern of severe LVH. Systolic function was   normal. The estimated ejection fraction was in the range of 55%   to 60%. Wall motion was normal; there were no regional wall   motion abnormalities. Doppler parameters are consistent with   abnormal left ventricular relaxation (grade 1 diastolic   dysfunction). - Left atrium: The atrium was mildly dilated. - Right atrium: Central venous pressure (est): 3 mm Hg. - Atrial septum: No defect or patent foramen ovale was identified. - Tricuspid valve: There was trivial regurgitation. - Pulmonary arteries: Systolic pressure could not be accurately   estimated. - Pericardium, extracardiac: There was no pericardial effusion.  ASSESSMENT AND PLAN:  1. Hypertension: Much better controlled on current medication regimen. He is now taking all his medications essentially as directed. He was placed on hydralazine 50 mg 3 times a day, but is only been taking 25 mg 3 times a day. Blood pressure is well controlled currently at 130/78. Much better than 182/110. I will keep him on the current dose of hydralazine for now.  Follow-up BMET. He will stop taking carvedilol the night before in the morning of stress test.  2. Dyspnea with chest pressure: The patient will be scheduled for a exercise MPI study. Echocardiogram reveals normal LV systolic function but he continues to have chest pressure on occasion. We'll rule out ischemia. If test is normal, he will be allowed to return to work as long as he keeps his blood pressure controlled.  3. Ongoing tobacco abuse: I believe this is contributing to his dyspnea. He has been counseled on smoking cessation.     Current medicines are reviewed at length with the patient today.    Labs/ tests ordered today include: NM stress test and BMET No orders  of the defined types were placed in this encounter.    Disposition:   FU with post tests.  Signed, [name], NP  03/19/2016 2:54 PM    [facility] [address] [place], [place] [address] Phone: [phone]; Fax: [phone]

## 2016-03-25 ENCOUNTER — Telehealth: Payer: Self-pay | Admitting: *Deleted

## 2016-03-25 ENCOUNTER — Inpatient Hospital Stay (HOSPITAL_COMMUNITY): Admission: RE | Admit: 2016-03-25 | Payer: BLUE CROSS/BLUE SHIELD | Source: Ambulatory Visit

## 2016-03-25 ENCOUNTER — Encounter (HOSPITAL_COMMUNITY): Payer: Self-pay

## 2016-03-25 ENCOUNTER — Encounter (HOSPITAL_COMMUNITY)
Admission: RE | Admit: 2016-03-25 | Discharge: 2016-03-25 | Disposition: A | Discharge status: Home / Self Care | Payer: BLUE CROSS/BLUE SHIELD | Source: Ambulatory Visit | Attending: Adult Health | Admitting: Adult Health

## 2016-03-25 DIAGNOSIS — I10 Essential (primary) hypertension: Secondary | ICD-10-CM

## 2016-03-25 DIAGNOSIS — R0789 Other chest pain: Secondary | ICD-10-CM | POA: Diagnosis not present

## 2016-03-25 LAB — NM MYOCAR MULTI W/SPECT W/WALL MOTION / EF
CHL CUP MPHR: 169 {beats}/min
CHL CUP NUCLEAR SDS: 4
CHL CUP NUCLEAR SSS: 4
CHL CUP RESTING HR STRESS: 68 {beats}/min
CSEPED: 7 min
CSEPEDS: 21 s
CSEPHR: 92 %
Estimated workload: 10.4 METS
LV sys vol: 38 mL
LVDIAVOL: 63 mL (ref 62–150)
Peak HR: 157 {beats}/min
RATE: 0.22
RPE: 15
SRS: 0
TID: 1.01

## 2016-03-25 IMAGING — NM NM MYOCAR MULTI W/SPECT W/WALL MOTION & EF
2 series · 12 of 12 positions shown · non-contrast
Comparison: none

[Series 1: rest · 8.28mm/px · 6 of 64 frames shown]
[frame 6/64]
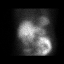
[frame 16/64]
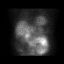
[frame 27/64]
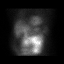
[frame 38/64]
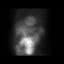
[frame 48/64]
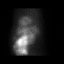
[frame 59/64]
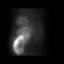

[Series 2: stress gated · 8.28mm/px · 6 of 64 frames shown]
[frame 6/64]
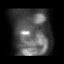
[frame 16/64]
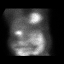
[frame 27/64]
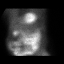
[frame 38/64]
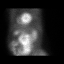
[frame 48/64]
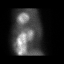
[frame 59/64]
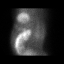

[12 of 12 positions shown; findings below may reference images not displayed]

Canned report from images found in remote index.

Refer to host system for actual result text.

## 2016-03-25 MED ORDER — SODIUM CHLORIDE 0.9% FLUSH
INTRAVENOUS | Status: AC
  Administered 2016-03-25: 10 mL via INTRAVENOUS
  Filled 2016-03-25: qty 10

## 2016-03-25 MED ORDER — TECHNETIUM TC 99M TETROFOSMIN IV KIT
10.0000 | PACK | Freq: Once | INTRAVENOUS | Status: AC | PRN
  Administered 2016-03-25: 10.6 via INTRAVENOUS

## 2016-03-25 MED ORDER — TECHNETIUM TC 99M TETROFOSMIN IV KIT
30.0000 | PACK | Freq: Once | INTRAVENOUS | Status: AC | PRN
  Administered 2016-03-25: 30.3 via INTRAVENOUS

## 2016-03-25 MED ORDER — REGADENOSON 0.4 MG/5ML IV SOLN
INTRAVENOUS | Status: AC
  Filled 2016-03-25: qty 5

## 2016-03-25 NOTE — Telephone Encounter (Signed)
-----   Message from Lendon Colonel, NP sent at 03/25/2016  3:51 PM EST ----- Stress test was negative for ischemia, but had high BP response. Continue to take antihypertensives as directed. Will see on follow up to continue management of BP.

## 2016-03-25 NOTE — Telephone Encounter (Signed)
Called patient with test results. No answer. Left message to call back.  

## 2016-04-01 ENCOUNTER — Ambulatory Visit (INDEPENDENT_AMBULATORY_CARE_PROVIDER_SITE_OTHER): Payer: BLUE CROSS/BLUE SHIELD | Admitting: Adult Health

## 2016-04-01 ENCOUNTER — Encounter: Payer: Self-pay | Admitting: Adult Health

## 2016-04-01 VITALS — BP 112/70 | HR 82 | Ht 67.0 in | Wt 220.0 lb

## 2016-04-01 DIAGNOSIS — I10 Essential (primary) hypertension: Secondary | ICD-10-CM

## 2016-04-01 NOTE — Patient Instructions (Signed)
Your physician recommends that you schedule a follow-up appointment in: May/June  Your physician recommends that you continue on your current medications as directed. Please refer to the Current Medication list given to you today.  If you need a refill on your cardiac medications before your next appointment, please call your pharmacy.  Thank you for choosing [facility]!   DASH Eating Plan DASH stands for "Dietary Approaches to Stop Hypertension." The DASH eating plan is a healthy eating plan that has been shown to reduce high blood pressure (hypertension). Additional health benefits may include reducing the risk of type 2 diabetes mellitus, heart disease, and stroke. The DASH eating plan may also help with weight loss. What do I need to know about the DASH eating plan? For the DASH eating plan, you will follow these general guidelines:  Choose foods with less than 150 milligrams of sodium per serving (as listed on the food label).  Use salt-free seasonings or herbs instead of table salt or sea salt.  Check with your health care provider or pharmacist before using salt substitutes.  Eat lower-sodium products. These are often labeled as "low-sodium" or "no salt added."  Eat fresh foods. Avoid eating a lot of canned foods.  Eat more vegetables, fruits, and low-fat dairy products.  Choose whole grains. Look for the word "whole" as the first word in the ingredient list.  Choose fish and skinless chicken or [place] more often than red meat. Limit fish, poultry, and meat to 6 oz (170 g) each day.  Limit sweets, desserts, sugars, and sugary drinks.  Choose heart-healthy fats.  Eat more home-cooked food and less restaurant, buffet, and fast food.  Limit fried foods.  Do not fry foods. Cook foods using methods such as baking, boiling, grilling, and broiling instead.  When eating at a restaurant, ask that your food be prepared with less salt, or no salt if possible. What  foods can I eat? Seek help from a dietitian for individual calorie needs. Grains  Whole grain or whole wheat bread. Brown rice. Whole grain or whole wheat pasta. Quinoa, bulgur, and whole grain cereals. Low-sodium cereals. Corn or whole wheat flour tortillas. Whole grain cornbread. Whole grain crackers. Low-sodium crackers. Vegetables  Fresh or frozen vegetables (raw, steamed, roasted, or grilled). Low-sodium or reduced-sodium tomato and vegetable juices. Low-sodium or reduced-sodium tomato sauce and paste. Low-sodium or reduced-sodium canned vegetables. Fruits  All fresh, canned (in natural juice), or frozen fruits. Meat and Other Protein Products  Ground beef (85% or leaner), grass-fed beef, or beef trimmed of fat. Skinless chicken or [place]. Ground chicken or [place]. Pork trimmed of fat. All fish and seafood. Eggs. Dried beans, peas, or lentils. Unsalted nuts and seeds. Unsalted canned beans. Dairy  Low-fat dairy products, such as skim or 1% milk, 2% or reduced-fat cheeses, low-fat ricotta or cottage cheese, or plain low-fat yogurt. Low-sodium or reduced-sodium cheeses. Fats and Oils  Tub margarines without trans fats. Light or reduced-fat mayonnaise and salad dressings (reduced sodium). Avocado. Safflower, olive, or canola oils. Natural peanut or almond butter. Other  Unsalted popcorn and pretzels. The items listed above may not be a complete list of recommended foods or beverages. Contact your dietitian for more options.  What foods are not recommended? Grains  White bread. White pasta. White rice. Refined cornbread. Bagels and croissants. Crackers that contain trans fat. Vegetables  Creamed or fried vegetables. Vegetables in a cheese sauce. Regular canned vegetables. Regular canned tomato sauce and paste. Regular tomato and vegetable  juices. Fruits  Canned fruit in light or heavy syrup. Fruit juice. Meat and Other Protein Products  Fatty cuts of meat. Ribs, chicken wings, bacon,  sausage, bologna, salami, chitterlings, fatback, hot dogs, bratwurst, and packaged luncheon meats. Salted nuts and seeds. Canned beans with salt. Dairy  Whole or 2% milk, cream, half-and-half, and cream cheese. Whole-fat or sweetened yogurt. Full-fat cheeses or blue cheese. Nondairy creamers and whipped toppings. Processed cheese, cheese spreads, or cheese curds. Condiments  Onion and garlic salt, seasoned salt, table salt, and sea salt. Canned and packaged gravies. Worcestershire sauce. Tartar sauce. Barbecue sauce. Teriyaki sauce. Soy sauce, including reduced sodium. Steak sauce. Fish sauce. Oyster sauce. Cocktail sauce. Horseradish. Ketchup and mustard. Meat flavorings and tenderizers. Bouillon cubes. Hot sauce. Tabasco sauce. Marinades. Taco seasonings. Relishes. Fats and Oils  Butter, stick margarine, lard, shortening, ghee, and bacon fat. Coconut, palm kernel, or palm oils. Regular salad dressings. Other  Pickles and olives. Salted popcorn and pretzels. The items listed above may not be a complete list of foods and beverages to avoid. Contact your dietitian for more information.  Where can I find more information? National Heart, Lung, and Blood Institute: travelstabloid.com This information is not intended to replace advice given to you by your health care provider. Make sure you discuss any questions you have with your health care provider. Document Released: 01/31/2011 Document Revised: 07/20/2015 Document Reviewed: 12/16/2012 Elsevier Interactive Patient Education  2017 [employer].

## 2016-04-01 NOTE — Progress Notes (Signed)
Name: Craig Knight    DOB: 03/02/1965  Age: 51 y.o.  MR#: [id number]       PCP:  [name], MD      Insurance: Payor: [place] / Plan: [place] / Product Type: *No Product type* /   CC:   No chief complaint on file.   VS Vitals:   04/01/16 1349  BP: 112/70  Pulse: 82  SpO2: 95%  Weight: 220 lb (99.8 kg)  Height: 5\' 7"  (1.702 m)    Weights Current Weight  04/01/16 220 lb (99.8 kg)  03/19/16 217 lb (98.4 kg)  03/11/16 222 lb (100.7 kg)    Blood Pressure  BP Readings from Last 3 Encounters:  04/01/16 112/70  03/19/16 138/78  03/11/16 105/73     Admit date:  (Not on file) Last encounter with RMR:  03/19/2016   Allergy Dust mite extract and Lisinopril  Current Outpatient Prescriptions  Medication Sig Dispense Refill  . albuterol (PROVENTIL HFA;VENTOLIN HFA) 108 (90 Base) MCG/ACT inhaler Inhale 2 puffs into the lungs every 6 (six) hours as needed for wheezing or shortness of breath. 1 Inhaler 0  . amLODipine (NORVASC) 10 MG tablet Take 1 tablet (10 mg total) by mouth daily. 30 tablet 11  . aspirin EC 81 MG tablet Take 1 tablet (81 mg total) by mouth daily.    . carvedilol (COREG) 6.25 MG tablet Take 1 tablet (6.25 mg total) by mouth 2 (two) times daily with a meal. 60 tablet 5  . chlorthalidone (HYGROTON) 25 MG tablet Take 1 tablet (25 mg total) by mouth daily. 90 tablet 2  . hydrALAZINE (APRESOLINE) 25 MG tablet Take 25 mg by mouth 3 (three) times daily.    . isosorbide mononitrate (IMDUR) 60 MG 24 hr tablet Take 1 tablet (60 mg total) by mouth daily. 30 tablet 5  . losartan (COZAAR) 25 MG tablet Take 1 tablet (25 mg total) by mouth daily. 30 tablet 5  . metFORMIN (GLUCOPHAGE) 500 MG tablet Take 1 tablet (500 mg total) by mouth daily with breakfast. 30 tablet 5  . nitroGLYCERIN (NITROSTAT) 0.4 MG SL tablet Place 1 tablet (0.4 mg total) under the tongue every 5 (five) minutes as needed for chest pain. 30 tablet 0   No current facility-administered  medications for this visit.     Discontinued Meds:    Medications Discontinued During This Encounter  Medication Reason  . hydrALAZINE (APRESOLINE) 25 MG tablet Error    Patient Active Problem List   Diagnosis Date Noted  . Hypertensive emergency 02/16/2016  . Hypertensive urgency 06/07/2015  . CKD (chronic kidney disease), stage III 06/07/2015  . Pain in the chest   . Elevated troponin   . Chronic diastolic CHF (congestive heart failure) ([place]) 09/17/2013  . DM type 2 (diabetes mellitus, type 2) ([place]) 09/15/2013  . Chest pain 08/01/2011  . HTN (hypertension) 08/01/2011  . Chest pain 07/22/2011  . Renal insufficiency 07/22/2011  . Tobacco abuse 07/22/2011  . Hypertension     LABS    Component Value Date/Time   NA 139 02/17/2016 0620   NA 139 02/16/2016 0800   NA 141 06/07/2015 0235   K 3.8 02/17/2016 0620   K 3.9 02/16/2016 0800   K 4.1 06/07/2015 0235   CL 106 02/17/2016 0620   CL 107 02/16/2016 0800   CL 108 06/07/2015 0235   CO2 25 02/17/2016 0620   CO2 26 02/16/2016 0800   CO2 27 06/07/2015 0235  GLUCOSE 107 (H) 02/17/2016 0620   GLUCOSE 99 02/16/2016 0800   GLUCOSE 109 (H) 06/07/2015 0235   BUN 25 (H) 02/17/2016 0620   BUN 22 (H) 02/16/2016 0800   BUN 20 06/07/2015 0235   CREATININE 1.48 (H) 02/17/2016 0620   CREATININE 1.36 (H) 02/16/2016 0800   CREATININE 1.54 (H) 06/07/2015 0235   CREATININE 1.56 (H) 11/11/2013 1437   CALCIUM 9.0 02/17/2016 0620   CALCIUM 9.8 02/16/2016 0800   CALCIUM 9.1 06/07/2015 0235   GFRNONAA 53 (L) 02/17/2016 0620   GFRNONAA 59 (L) 02/16/2016 0800   GFRNONAA 51 (L) 06/07/2015 0235   GFRAA >60 02/17/2016 0620   GFRAA >60 02/16/2016 0800   GFRAA 59 (L) 06/07/2015 0235   CMP     Component Value Date/Time   NA 139 02/17/2016 0620   K 3.8 02/17/2016 0620   CL 106 02/17/2016 0620   CO2 25 02/17/2016 0620   GLUCOSE 107 (H) 02/17/2016 0620   BUN 25 (H) 02/17/2016 0620   CREATININE 1.48 (H) 02/17/2016 0620   CREATININE  1.56 (H) 11/11/2013 1437   CALCIUM 9.0 02/17/2016 0620   PROT 6.4 (L) 06/07/2015 0235   ALBUMIN 3.8 06/07/2015 0235   AST 14 (L) 06/07/2015 0235   ALT 14 (L) 06/07/2015 0235   ALKPHOS 85 06/07/2015 0235   BILITOT 0.4 06/07/2015 0235   GFRNONAA 53 (L) 02/17/2016 0620   GFRAA >60 02/17/2016 0620       Component Value Date/Time   WBC 10.2 02/17/2016 0620   WBC 9.9 02/16/2016 0800   WBC 9.0 06/07/2015 0235   HGB 15.1 02/17/2016 0620   HGB 16.1 02/16/2016 0800   HGB 14.3 06/07/2015 0235   HCT 45.8 02/17/2016 0620   HCT 46.5 02/16/2016 0800   HCT 42.4 06/07/2015 0235   MCV 81.1 02/17/2016 0620   MCV 80.3 02/16/2016 0800   MCV 80.2 06/07/2015 0235    Lipid Panel     Component Value Date/Time   CHOL 157 06/07/2015 0815   TRIG 85 06/07/2015 0815   HDL 34 (L) 06/07/2015 0815   CHOLHDL 4.6 06/07/2015 0815   VLDL 17 06/07/2015 0815   LDLCALC 106 (H) 06/07/2015 0815    ABG No results found for: PHART, PCO2ART, PO2ART, HCO3, TCO2, ACIDBASEDEF, O2SAT   Lab Results  Component Value Date   TSH 1.790 09/16/2013   BNP (last 3 results)  Recent Labs  06/07/15 0115  BNP 48.0    ProBNP (last 3 results) No results for input(s): PROBNP in the last 8760 hours.  Cardiac Panel (last 3 results) No results for input(s): CKTOTAL, CKMB, TROPONINI, RELINDX in the last 72 hours.  Iron/TIBC/Ferritin/ %Sat No results found for: IRON, TIBC, FERRITIN, IRONPCTSAT   EKG Orders placed or performed during the hospital encounter of 02/16/16  . EKG 12-Lead  . EKG 12-Lead  . ED EKG within 10 minutes  . ED EKG within 10 minutes  . EKG     Prior Assessment and Plan Problem List as of 04/01/2016 Reviewed: 03/19/2016  2:59 PM by [name], NP     Cardiovascular and Mediastinum   Hypertension   HTN (hypertension)   Chronic diastolic CHF (congestive heart failure) ([place])   Hypertensive urgency   Hypertensive emergency     Endocrine   DM type 2 (diabetes mellitus, type 2) ([place])      Genitourinary   Renal insufficiency   CKD (chronic kidney disease), stage III     Other   Chest pain  Tobacco abuse   Chest pain   Pain in the chest   Elevated troponin       Imaging: Nm Myocar Multi W/spect W/wall Motion / Ef  Result Date: 03/25/2016  Equivocal ST segment abnormalities in the face of resting changes and increased voltage, overall nonspecific. Significant hypertensive response. Rare PACs.  Blood pressure demonstrated a hypertensive response to exercise.  No significant myocardial perfusion defects to indicate scar or ischemia.  This is an intermediate risk study based on calculated ejection fraction. Suggest echocardiogram to confirm.  Nuclear stress EF: 38%.

## 2016-04-01 NOTE — Progress Notes (Signed)
Cardiology Office Note   Date:  04/01/2016   ID:  Craig Knight, DOB 26-Mar-1965, MRN [id number]  PCP:  [name], MD  Cardiologist: Cloria Spring, NP   Chief Complaint  Patient presents with  . Hypertension    History of Present Illness: Craig Knight is a 51 y.o. male who presents for ongoing assessment and management of hypertension, chronic dyspnea on exertion, chronic chest pain, with history of medical noncompliance. The patient was last seen in the office on 03/19/2016, had been compliant with his medications, but was still confused on dosing times. His blood pressure was much better controlled, and he denied having any discomfort in his chest. He is taking hydralazine 25 mg 3 times a day.  He was scheduled for a cardiac MPI, to evaluate for ischemia causing prior chest pain now that his blood pressure was well-controlled. I BMET was ordered. Due to dyspnea I believe his smoking was contributing. He was advised to stop.  Echocardiogram 02/16/2016 Left ventricle: The cavity size was normal. Wall thickness was increased in a pattern of severe LVH. Systolic function was normal. The estimated ejection fraction was in the range of 55% to 60%. Wall motion was normal; there were no regional wall motion abnormalities. Doppler parameters are consistent with abnormal left ventricular relaxation (grade 1 diastolic dysfunction). - Left atrium: The atrium was mildly dilated. - Right atrium: Central venous pressure (est): 3 mm Hg. - Atrial septum: No defect or patent foramen ovale was identified. - Tricuspid valve: There was trivial regurgitation. - Pulmonary arteries: Systolic pressure could not be accurately estimated. - Pericardium, extracardiac: There was no pericardial effusion.  STRESS MPI 03/25/2016  Equivocal ST segment abnormalities in the face of resting changes and increased voltage, overall nonspecific. Significant hypertensive response. Rare  PACs.  Blood pressure demonstrated a hypertensive response to exercise.  No significant myocardial perfusion defects to indicate scar or ischemia.  This is an intermediate risk study based on calculated ejection fraction. Suggest echocardiogram to confirm.  Nuclear stress EF: 38%.   Labs: Sodium 139, potassium 3.8, chloride 106, CO2 25, glucose 107, BUN 25, creatinine 1.48.  He comes today stable from a cardiac standpoint. He is concerned about his weight gain. He still remains tired but has not had any testing concerning sleep apnea. He states he can't afford any further testing at this time.  Past Medical History:  Diagnosis Date  . Asthma   . Bronchitis   . Hypertension     Past Surgical History:  Procedure Laterality Date  . lipoma removal       Current Outpatient Prescriptions  Medication Sig Dispense Refill  . albuterol (PROVENTIL HFA;VENTOLIN HFA) 108 (90 Base) MCG/ACT inhaler Inhale 2 puffs into the lungs every 6 (six) hours as needed for wheezing or shortness of breath. 1 Inhaler 0  . amLODipine (NORVASC) 10 MG tablet Take 1 tablet (10 mg total) by mouth daily. 30 tablet 11  . aspirin EC 81 MG tablet Take 1 tablet (81 mg total) by mouth daily.    . carvedilol (COREG) 6.25 MG tablet Take 1 tablet (6.25 mg total) by mouth 2 (two) times daily with a meal. 60 tablet 5  . chlorthalidone (HYGROTON) 25 MG tablet Take 1 tablet (25 mg total) by mouth daily. 90 tablet 2  . hydrALAZINE (APRESOLINE) 25 MG tablet Take 25 mg by mouth 3 (three) times daily.    . isosorbide mononitrate (IMDUR) 60 MG 24 hr tablet Take 1 tablet (60 mg  total) by mouth daily. 30 tablet 5  . losartan (COZAAR) 25 MG tablet Take 1 tablet (25 mg total) by mouth daily. 30 tablet 5  . metFORMIN (GLUCOPHAGE) 500 MG tablet Take 1 tablet (500 mg total) by mouth daily with breakfast. 30 tablet 5  . nitroGLYCERIN (NITROSTAT) 0.4 MG SL tablet Place 1 tablet (0.4 mg total) under the tongue every 5 (five) minutes as  needed for chest pain. 30 tablet 0   No current facility-administered medications for this visit.     Allergies:   Dust mite extract and Lisinopril    Social History:  The patient  reports that he quit smoking about 2 weeks ago. His smoking use included Cigarettes. He has a 12.50 pack-year smoking history. He quit smokeless tobacco use about 8 months ago. He reports that he drinks alcohol. He reports that he does not use drugs.   Family History:  The patient's family history includes Heart attack (age of onset: 8) in his father; Hypertension in his brother and sister; Stroke in his brother and mother.    ROS: All other systems are reviewed and negative. Unless otherwise mentioned in H&P    PHYSICAL EXAM: VS:  BP 112/70   Pulse 82   Ht 5\' 7"  (1.702 m)   Wt 220 lb (99.8 kg)   SpO2 95%   BMI 34.46 kg/m  , BMI Body mass index is 34.46 kg/m. GEN: Well nourished, well developed, in no acute distress Obese. HEENT: normal  Neck: no JVD, carotid bruits, or masses Cardiac:RRR; no murmurs, rubs, or gallops,no edema  Respiratory:  clear to auscultation bilaterally, normal work of breathing GI: soft, nontender, nondistended, + BS MS: no deformity or atrophy  Skin: warm and dry, no rash Neuro:  Strength and sensation are intact Psych: euthymic mood, full affect   Recent Labs: 06/07/2015: ALT 14; B Natriuretic Peptide 48.0 02/17/2016: BUN 25; Creatinine, Ser 1.48; Hemoglobin 15.1; Platelets 233; Potassium 3.8; Sodium 139    Lipid Panel    Component Value Date/Time   CHOL 157 06/07/2015 0815   TRIG 85 06/07/2015 0815   HDL 34 (L) 06/07/2015 0815   CHOLHDL 4.6 06/07/2015 0815   VLDL 17 06/07/2015 0815   LDLCALC 106 (H) 06/07/2015 0815      Wt Readings from Last 3 Encounters:  04/01/16 220 lb (99.8 kg)  03/19/16 217 lb (98.4 kg)  03/11/16 222 lb (100.7 kg)     ASSESSMENT AND PLAN:  1.  Hypertension: Much better controlled on current medication regimen. He's asked to avoid  salty foods, which he can increase his blood pressure. I have discussed with him weight loss and increased exercise which will also help to get his blood pressure better controlled. If in fact he does lose weight we will have to decrease his medications. I also recommended that he follow up with primary care and consider being scheduled for a sleep apnea evaluation.  2. Diabetes: Followed by primary care. He is advised to adhere to a low carb low-sodium diet.   Current medicines are reviewed at length with the patient today.    Labs/ tests ordered today include:  No orders of the defined types were placed in this encounter.    Disposition:   FU with 5 months.  Signed, [name], NP  04/01/2016 1:54 PM    [facility] [address] [place], [place] [address] Phone: [phone]; Fax: [phone]

## 2016-04-11 ENCOUNTER — Encounter: Payer: Self-pay | Admitting: Family Medicine

## 2016-04-11 ENCOUNTER — Ambulatory Visit (INDEPENDENT_AMBULATORY_CARE_PROVIDER_SITE_OTHER): Payer: BLUE CROSS/BLUE SHIELD | Admitting: Family Medicine

## 2016-04-11 VITALS — BP 118/80 | Ht 67.0 in | Wt 219.0 lb

## 2016-04-11 DIAGNOSIS — Z79899 Other long term (current) drug therapy: Secondary | ICD-10-CM | POA: Diagnosis not present

## 2016-04-11 DIAGNOSIS — Z125 Encounter for screening for malignant neoplasm of prostate: Secondary | ICD-10-CM

## 2016-04-11 DIAGNOSIS — R21 Rash and other nonspecific skin eruption: Secondary | ICD-10-CM

## 2016-04-11 DIAGNOSIS — Z13220 Encounter for screening for lipoid disorders: Secondary | ICD-10-CM

## 2016-04-11 DIAGNOSIS — R0600 Dyspnea, unspecified: Secondary | ICD-10-CM | POA: Diagnosis not present

## 2016-04-11 DIAGNOSIS — I10 Essential (primary) hypertension: Secondary | ICD-10-CM

## 2016-04-11 MED ORDER — DOXYCYCLINE HYCLATE 100 MG PO TABS: 100.0000 mg | ORAL_TABLET | Freq: Two times a day (BID) | ORAL | 0 refills | Status: DC

## 2016-04-11 NOTE — Progress Notes (Signed)
   Subjective:    Patient ID: Craig Knight, male    DOB: 1965-03-02, 51 y.o.   MRN: [id number] Patient arrives office with numerous concerns Hypertension  This is a chronic problem. The current episode started more than 1 year ago. Compliance problems: tries to walk but can't do too much because he gets short of breath, eats healthy, takes meds every day.    Having shortness of breath. Albuterol inhaler not helping. Long-standing history of smoking. Claims he quit just 2 weeks ago. Shortness of breath progressive with exertion  Little bumps that itch on chest, back and arms. Came up after starting new heart meds. Tiny pimple-like bumps cropping up across torso. Some tender and sore.  htn  Blood pressure medicine and blood pressure levels reviewed today with patient. Compliant with blood pressure medicine. States does not miss a dose. No obvious side effects. Blood pressure generally good when checked elsewhere. Watching salt intake.   .Last A1C done in hospital on 02/16/16. Result was 6.4.  Patient claims compliance with diabetes medication. No obvious side effects. Reports no substantial low sugar spells. Most numbers are generally in good range when checked fasting. Generally does not miss a dose of medication. Watching diabetic diet closely  Review of Systems No headache, no major weight loss or weight gain, no chest pain no back pain abdominal pain no change in bowel habits complete ROS otherwise negative     Objective:   Physical Exam   Alert vitals stable, NAD. Blood pressure good on repeat. HEENT normal. Lungs clear. Heart regular rate and rhythm. Diffuse folliculitis eruption upper torso, C diabetic foot exam     Assessment & Plan:Impression 1 type 2 diabetes good control discussed maintain same dose meds #2 asthma/COPD. Patient probably has elements of both. Had asthma as a young child. Needs to assess further. Patient resistant in the past but now agreeable would do  pulmonary function tests pre-and post and give recommendations after that. Patient states albuterol alone is not helping much #3 folliculitis discussed maintain doxycycline 100 twice a day symptom care discussed follow-up in 6 months. Medications refilled diet exercise discussed

## 2016-04-24 ENCOUNTER — Ambulatory Visit (HOSPITAL_COMMUNITY)
Admission: RE | Admit: 2016-04-24 | Discharge: 2016-04-24 | Disposition: A | Discharge status: Home / Self Care | Payer: BLUE CROSS/BLUE SHIELD | Source: Ambulatory Visit | Attending: Family Medicine | Admitting: Family Medicine

## 2016-04-24 DIAGNOSIS — R0600 Dyspnea, unspecified: Secondary | ICD-10-CM | POA: Diagnosis not present

## 2016-04-24 DIAGNOSIS — J984 Other disorders of lung: Secondary | ICD-10-CM | POA: Insufficient documentation

## 2016-04-24 DIAGNOSIS — J449 Chronic obstructive pulmonary disease, unspecified: Secondary | ICD-10-CM | POA: Insufficient documentation

## 2016-04-24 MED ORDER — ALBUTEROL SULFATE (2.5 MG/3ML) 0.083% IN NEBU
2.5000 mg | INHALATION_SOLUTION | Freq: Once | RESPIRATORY_TRACT | Status: AC
Start: 2016-04-24 — End: 2016-04-24
  Administered 2016-04-24: 2.5 mg via RESPIRATORY_TRACT

## 2016-05-01 LAB — PULMONARY FUNCTION TEST
DL/VA % pred: 102 %
DL/VA: 4.61 ml/min/mmHg/L
DLCO COR % PRED: 65 %
DLCO COR: 19.47 ml/min/mmHg
DLCO unc % pred: 65 %
DLCO unc: 19.47 ml/min/mmHg
FEF 25-75 POST: 1.17 L/s
FEF 25-75 Pre: 1.22 L/sec
FEF2575-%CHANGE-POST: -4 %
FEF2575-%PRED-PRE: 39 %
FEF2575-%Pred-Post: 37 %
FEV1-%Change-Post: -1 %
FEV1-%Pred-Post: 53 %
FEV1-%Pred-Pre: 54 %
FEV1-POST: 1.65 L
FEV1-Pre: 1.67 L
FEV1FVC-%CHANGE-POST: 1 %
FEV1FVC-%Pred-Pre: 84 %
FEV6-%CHANGE-POST: 3 %
FEV6-%Pred-Post: 64 %
FEV6-%Pred-Pre: 61 %
FEV6-Post: 2.41 L
FEV6-Pre: 2.32 L
FEV6FVC-%Pred-Post: 103 %
FEV6FVC-%Pred-Pre: 103 %
FVC-%Change-Post: -2 %
FVC-%PRED-POST: 62 %
FVC-%Pred-Pre: 64 %
FVC-Post: 2.41 L
FVC-Pre: 2.48 L
POST FEV1/FVC RATIO: 69 %
POST FEV6/FVC RATIO: 100 %
PRE FEV1/FVC RATIO: 67 %
Pre FEV6/FVC Ratio: 100 %
RV % pred: 98 %
RV: 1.93 L
TLC % PRED: 75 %
TLC: 4.93 L

## 2016-05-02 ENCOUNTER — Telehealth: Payer: Self-pay | Admitting: Family Medicine

## 2016-05-02 DIAGNOSIS — R0600 Dyspnea, unspecified: Secondary | ICD-10-CM

## 2016-05-02 NOTE — Telephone Encounter (Signed)
Patient wanting results of breathing test.

## 2016-05-03 NOTE — Telephone Encounter (Signed)
Left message return call 05/03/2016

## 2016-05-03 NOTE — Telephone Encounter (Signed)
Spoke with patient informed him per Dr.Steve Luking-Your lungs are working at only about two thirds capacity and this is concerning,likely copd from the smoking, but with pft's also showing what's called a restrictive component needs to see a lung doctor. Patient verbalized understanding.

## 2016-05-03 NOTE — Telephone Encounter (Signed)
Let pt know that his lungs ard working at only about two thirds cpacity and this is concerning,likely copd from the smoking, but with pft's also showing what's called a restrictive component needs to see a lung dr,  rec ref to pulmonary doctor

## 2016-05-06 ENCOUNTER — Encounter: Payer: Self-pay | Admitting: Family Medicine

## 2016-05-29 ENCOUNTER — Encounter: Payer: Self-pay | Admitting: Pulmonary Disease

## 2016-05-29 ENCOUNTER — Other Ambulatory Visit (INDEPENDENT_AMBULATORY_CARE_PROVIDER_SITE_OTHER): Payer: BLUE CROSS/BLUE SHIELD

## 2016-05-29 ENCOUNTER — Ambulatory Visit (INDEPENDENT_AMBULATORY_CARE_PROVIDER_SITE_OTHER): Payer: BLUE CROSS/BLUE SHIELD | Admitting: Pulmonary Disease

## 2016-05-29 VITALS — BP 136/76 | HR 107 | Ht 67.0 in | Wt 219.0 lb

## 2016-05-29 DIAGNOSIS — R0602 Shortness of breath: Secondary | ICD-10-CM | POA: Diagnosis not present

## 2016-05-29 DIAGNOSIS — F17210 Nicotine dependence, cigarettes, uncomplicated: Secondary | ICD-10-CM

## 2016-05-29 DIAGNOSIS — J449 Chronic obstructive pulmonary disease, unspecified: Secondary | ICD-10-CM | POA: Diagnosis not present

## 2016-05-29 LAB — CBC WITH DIFFERENTIAL/PLATELET
Basophils Absolute: 0.1 10*3/uL (ref 0.0–0.1)
Basophils Relative: 0.6 % (ref 0.0–3.0)
Eosinophils Absolute: 0.3 10*3/uL (ref 0.0–0.7)
Eosinophils Relative: 2.7 % (ref 0.0–5.0)
HEMATOCRIT: 40.3 % (ref 39.0–52.0)
HEMOGLOBIN: 14.1 g/dL (ref 13.0–17.0)
LYMPHS PCT: 25.9 % (ref 12.0–46.0)
Lymphs Abs: 2.7 10*3/uL (ref 0.7–4.0)
MCHC: 34.9 g/dL (ref 30.0–36.0)
MCV: 78.8 fl (ref 78.0–100.0)
MONO ABS: 1.1 10*3/uL — AB (ref 0.1–1.0)
Monocytes Relative: 10 % (ref 3.0–12.0)
Neutro Abs: 6.4 10*3/uL (ref 1.4–7.7)
Neutrophils Relative %: 60.8 % (ref 43.0–77.0)
Platelets: 233 10*3/uL (ref 150.0–400.0)
RBC: 5.12 Mil/uL (ref 4.22–5.81)
RDW: 14.7 % (ref 11.5–15.5)
WBC: 10.6 10*3/uL — AB (ref 4.0–10.5)

## 2016-05-29 LAB — NITRIC OXIDE: NITRIC OXIDE: 12

## 2016-05-29 MED ORDER — ALBUTEROL SULFATE HFA 108 (90 BASE) MCG/ACT IN AERS: 2.0000 | INHALATION_SPRAY | Freq: Four times a day (QID) | RESPIRATORY_TRACT | 2 refills | Status: DC | PRN

## 2016-05-29 MED ORDER — TIOTROPIUM BROMIDE-OLODATEROL 2.5-2.5 MCG/ACT IN AERS: 2.0000 | INHALATION_SPRAY | Freq: Every day | RESPIRATORY_TRACT | 5 refills | Status: DC

## 2016-05-29 MED ORDER — TIOTROPIUM BROMIDE-OLODATEROL 2.5-2.5 MCG/ACT IN AERS: 2.0000 | INHALATION_SPRAY | Freq: Every day | RESPIRATORY_TRACT | 0 refills | Status: AC

## 2016-05-29 NOTE — Addendum Note (Signed)
Addended by: Maryanna Shape A on: 05/29/2016 03:30 PM   Modules accepted: Orders

## 2016-05-29 NOTE — Progress Notes (Signed)
Craig Knight    [id number]    06/06/1965  Primary Care Physician:Steve Wolfgang Phoenix, MD  Referring Physician: Mikey Kirschner, MD [address], [place] [address]  Chief complaint:  Consult for evaluation of dyspnea  HPI: Craig Knight is 51 year old with past medical history of childhood asthma, hypertension, active smoker. He was diagnosed with asthma as a child which got better and admitted. 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 He reports mainly dyspnea on exertion and does not have dyspnea on rest, wheezing. He has daily symptoms of cough with white sputum production. He is to have an albuterol rescue inhaler but is not sure if it helped with the symptoms. He is currently not on any inhalers. He does not have seasonal allergies, sensitivities to cats, dogs, strong perfumes, no heartburn symptoms  He has over 38-pack-year smoking history and is trying to quit. He is down to 1 pack per week now. 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  Outpatient Encounter Prescriptions as of 05/29/2016  Medication Sig  . amLODipine (NORVASC) 10 MG tablet Take 1 tablet (10 mg total) by mouth daily.  Marland Kitchen aspirin EC 81 MG tablet Take 1 tablet (81 mg total) by mouth daily.  . carvedilol (COREG) 6.25 MG tablet Take 1 tablet (6.25 mg total) by mouth 2 (two) times daily with a meal.  . chlorthalidone (HYGROTON) 25 MG tablet Take 1 tablet (25 mg total) by mouth daily.  . hydrALAZINE (APRESOLINE) 25 MG tablet Take 25 mg by mouth 3 (three) times daily.  . isosorbide mononitrate (IMDUR) 60 MG 24 hr tablet Take 1 tablet (60 mg total) by mouth daily.  Marland Kitchen losartan (COZAAR) 25 MG tablet Take 1 tablet (25 mg total) by mouth daily.  . metFORMIN (GLUCOPHAGE) 500 MG tablet Take 1 tablet (500 mg total) by mouth daily with breakfast.  . nitroGLYCERIN (NITROSTAT) 0.4 MG SL  tablet Place 1 tablet (0.4 mg total) under the tongue every 5 (five) minutes as needed for chest pain.  . [DISCONTINUED] albuterol (PROVENTIL HFA;VENTOLIN HFA) 108 (90 Base) MCG/ACT inhaler Inhale 2 puffs into the lungs every 6 (six) hours as needed for wheezing or shortness of breath.  . [DISCONTINUED] doxycycline (VIBRA-TABS) 100 MG tablet Take 1 tablet (100 mg total) by mouth 2 (two) times daily.   No facility-administered encounter medications on file as of 05/29/2016.     Allergies as of 05/29/2016 - Review Complete 05/29/2016  Allergen Reaction Noted  . Dust mite extract  10/13/2013  . Lisinopril Cough 06/07/2015    Past Medical History:  Diagnosis Date  . Asthma   . Bronchitis   . Hypertension     Past Surgical History:  Procedure Laterality Date  . lipoma removal      Family History  Problem Relation Age of Onset  . Stroke Mother   . Heart attack Father 43    CABG  . Hypertension Sister   . Stroke Brother   . Hypertension Brother     Social History   Social History  . Marital status: Married    Spouse name: N/A  . Number of children: N/A  . Years of education: N/A   Occupational History  . Not on file.   Social History Main Topics  . Smoking status: Current Some Day Smoker  Packs/day: 0.50    Years: 25.00    Types: Cigarettes  . Smokeless tobacco: Former Systems developer    Quit date: 07/11/2015     Comment: a pack will last a week-05/29/2016  . Alcohol use 0.0 oz/week     Comment: occasionally  . Drug use: No  . Sexual activity: Yes    Birth control/ protection: None   Other Topics Concern  . Not on file   Social History Narrative  . No narrative on file    Review of systems: Review of Systems  Constitutional: Negative for fever and chills.  HENT: Negative.   Eyes: Negative for blurred vision.  Respiratory: as per HPI  Cardiovascular: Negative for chest pain and palpitations.  Gastrointestinal: Negative for vomiting, diarrhea, blood per  rectum. Genitourinary: Negative for dysuria, urgency, frequency and hematuria.  Musculoskeletal: Negative for myalgias, back pain and joint pain.  Skin: Negative for itching and rash.  Neurological: Negative for dizziness, tremors, focal weakness, seizures and loss of consciousness.  Endo/Heme/Allergies: Negative for environmental allergies.  Psychiatric/Behavioral: Negative for depression, suicidal ideas and hallucinations.  All other systems reviewed and are negative.  Physical Exam: Blood pressure 136/76, pulse (!) 107, height 5\' 7"  (1.702 m), weight 219 lb (99.3 kg), SpO2 98 %. Gen:      No acute distress HEENT:  EOMI, sclera anicteric Neck:     No masses; no thyromegaly Lungs:    Clear to auscultation bilaterally; normal respiratory effort CV:         Regular rate and rhythm; no murmurs Abd:      + bowel sounds; soft, non-tender; no palpable masses, no distension Ext:    No edema; adequate peripheral perfusion Skin:      Warm and dry; no rash Neuro: alert and oriented x 3 Psych: normal mood and affect  Data Reviewed: FENO 05/29/16- 12  PFTs 04/24/16 FVC 2.01 (62%) FEV1 1.65 (53%) F/F 69 TLC 75%, RV/TLC 130% ERV 30% DLCO 65%, DLCO/VA 102% Moderate to severe obstructive airway disease with no bronchodilator response. Moderate diffusion impairment which corrects for alveolar volume Mild reduction in total lung capacity, airtrapping.  Chest x-ray 02/16/16-no acute cardio pulmonary abnormality. I reviewed all images personally.  Assessment:  Consult for evaluation of dyspnea Review of his PFTs show significant obstruction with no bronchodilator response. In addition he has air trapping consistent with emphysema. There is no bronchodilator response, FENO is low and his clinical picture is not consistent with asthma. There is mild restriction with marked reduction in ERV secondary to body habitus and obesity. There is no suspicion for interstitial lung disease on his chest  imaging  We will check CBC with differential and a blood allergy profile. We'll start combination therapy with LABA+LAMA with stiolto and give a albuterol rescue inhaler. I don't believe he needs to be on inhaled corticosteroids as suspicion for asthma low and there is no evidence of airway inflammation.  Active smoker  Continue to smoke but is trying to quit. He does not want any help quitting and want to do it on his own. We will reassess at next visit to see if he will need nicotine replacement therapies or other aids to quit. Time spent counseling 5 mins.  Plan/Recommendations: Barrister's clerk, albuterol inhaler - CBC with diff, blood allergy profile. - Smoking cessation.   Marshell Garfinkel MD [facility] Pager [phone] 05/29/2016, 2:19 PM  CC: Mikey Kirschner, MD

## 2016-05-29 NOTE — Progress Notes (Signed)
Patient ID: Craig Knight, male   DOB: 1965-09-27, 51 y.o.   MRN: [id number] Patient seen in the office today and instructed on use of stiolto repimat.  Patient expressed understanding and demonstrated technique.

## 2016-05-29 NOTE — Patient Instructions (Signed)
Will start on Stiolto inhaler and albuterol rescue inhaler Continue to work on smoking cessation We'll check blood work today including CBC with differential, blood allergy profile, alpha-1 antitrypsin level and phenotype  Return to clinic in 2 months.

## 2016-05-31 LAB — RESPIRATORY ALLERGY PROFILE REGION II ~~LOC~~
ALLERGEN, A. ALTERNATA, M6: 2.3 kU/L — AB
Allergen, C. Herbarum, M2: 0.4 kU/L — ABNORMAL HIGH
Allergen, Cottonwood, t14: <0.1 kU/L
Allergen, D pternoyssinus,d7: <0.1 kU/L
Allergen, Mulberry, t76: <0.1 kU/L
Allergen, P. notatum, m1: 0.15 kU/L — ABNORMAL HIGH
Aspergillus fumigatus, m3: 0.27 kU/L — ABNORMAL HIGH
Bermuda Grass: <0.1 kU/L
Cockroach: <0.1 kU/L
Common Ragweed: 0.45 kU/L — ABNORMAL HIGH
D. farinae: <0.1 kU/L
Dog Dander: <0.1 kU/L
ELM IGE: 0.16 kU/L — AB
IGE (IMMUNOGLOBULIN E), SERUM: 133 kU/L — AB (ref <115)
Pecan/Hickory Tree IgE: <0.1 kU/L
ROUGH PIGWEED IGE: 0.2 kU/L — AB
Timothy Grass: 0.25 kU/L — ABNORMAL HIGH

## 2016-06-03 LAB — ALPHA-1 ANTITRYPSIN PHENOTYPE: A-1 Antitrypsin: 134 mg/dL (ref 83–199)

## 2016-06-04 ENCOUNTER — Emergency Department (HOSPITAL_COMMUNITY)
Admission: EM | Admit: 2016-06-04 | Discharge: 2016-06-04 | Disposition: A | Discharge status: Home / Self Care | Payer: BLUE CROSS/BLUE SHIELD | Attending: Emergency Medicine | Admitting: Emergency Medicine

## 2016-06-04 ENCOUNTER — Encounter (HOSPITAL_COMMUNITY): Payer: Self-pay | Admitting: Emergency Medicine

## 2016-06-04 ENCOUNTER — Ambulatory Visit: Payer: BLUE CROSS/BLUE SHIELD | Admitting: Family Medicine

## 2016-06-04 ENCOUNTER — Emergency Department (HOSPITAL_COMMUNITY): Payer: BLUE CROSS/BLUE SHIELD

## 2016-06-04 ENCOUNTER — Telehealth: Payer: Self-pay | Admitting: Family Medicine

## 2016-06-04 DIAGNOSIS — Z7982 Long term (current) use of aspirin: Secondary | ICD-10-CM | POA: Insufficient documentation

## 2016-06-04 DIAGNOSIS — F17210 Nicotine dependence, cigarettes, uncomplicated: Secondary | ICD-10-CM | POA: Diagnosis not present

## 2016-06-04 DIAGNOSIS — I959 Hypotension, unspecified: Secondary | ICD-10-CM | POA: Diagnosis not present

## 2016-06-04 DIAGNOSIS — E1122 Type 2 diabetes mellitus with diabetic chronic kidney disease: Secondary | ICD-10-CM | POA: Insufficient documentation

## 2016-06-04 DIAGNOSIS — Z79899 Other long term (current) drug therapy: Secondary | ICD-10-CM | POA: Diagnosis not present

## 2016-06-04 DIAGNOSIS — N183 Chronic kidney disease, stage 3 (moderate): Secondary | ICD-10-CM | POA: Diagnosis not present

## 2016-06-04 DIAGNOSIS — M545 Low back pain, unspecified: Secondary | ICD-10-CM

## 2016-06-04 DIAGNOSIS — I5032 Chronic diastolic (congestive) heart failure: Secondary | ICD-10-CM | POA: Insufficient documentation

## 2016-06-04 DIAGNOSIS — K5900 Constipation, unspecified: Secondary | ICD-10-CM | POA: Diagnosis not present

## 2016-06-04 LAB — COMPREHENSIVE METABOLIC PANEL
ALBUMIN: 4.2 g/dL (ref 3.5–5.0)
ALK PHOS: 77 U/L (ref 38–126)
ALT: 29 U/L (ref 17–63)
AST: 19 U/L (ref 15–41)
Anion gap: 6 (ref 5–15)
BUN: 29 mg/dL — ABNORMAL HIGH (ref 6–20)
CALCIUM: 9.1 mg/dL (ref 8.9–10.3)
CHLORIDE: 103 mmol/L (ref 101–111)
CO2: 28 mmol/L (ref 22–32)
CREATININE: 1.87 mg/dL — AB (ref 0.61–1.24)
GFR calc non Af Amer: 40 mL/min — ABNORMAL LOW (ref >60)
GFR, EST AFRICAN AMERICAN: 46 mL/min — AB (ref >60)
GLUCOSE: 91 mg/dL (ref 65–99)
Potassium: 3.7 mmol/L (ref 3.5–5.1)
SODIUM: 137 mmol/L (ref 135–145)
Total Bilirubin: 0.7 mg/dL (ref 0.3–1.2)
Total Protein: 7.2 g/dL (ref 6.5–8.1)

## 2016-06-04 LAB — CBC WITH DIFFERENTIAL/PLATELET
BASOS ABS: 0.1 10*3/uL (ref 0.0–0.1)
Basophils Relative: 1 %
Eosinophils Absolute: 0.3 10*3/uL (ref 0.0–0.7)
Eosinophils Relative: 2 %
HEMATOCRIT: 41.2 % (ref 39.0–52.0)
Hemoglobin: 13.9 g/dL (ref 13.0–17.0)
LYMPHS ABS: 2.1 10*3/uL (ref 0.7–4.0)
LYMPHS PCT: 15 %
MCH: 27 pg (ref 26.0–34.0)
MCHC: 33.7 g/dL (ref 30.0–36.0)
MCV: 80.2 fL (ref 78.0–100.0)
MONO ABS: 0.8 10*3/uL (ref 0.1–1.0)
Monocytes Relative: 6 %
Neutro Abs: 10.3 10*3/uL — ABNORMAL HIGH (ref 1.7–7.7)
Neutrophils Relative %: 76 %
Platelets: 227 10*3/uL (ref 150–400)
RBC: 5.14 MIL/uL (ref 4.22–5.81)
RDW: 14.5 % (ref 11.5–15.5)
WBC: 13.6 10*3/uL — ABNORMAL HIGH (ref 4.0–10.5)

## 2016-06-04 LAB — URINALYSIS, ROUTINE W REFLEX MICROSCOPIC
Bilirubin Urine: NEGATIVE
Glucose, UA: NEGATIVE mg/dL
Hgb urine dipstick: NEGATIVE
Ketones, ur: NEGATIVE mg/dL
LEUKOCYTES UA: NEGATIVE
NITRITE: NEGATIVE
PH: 5 (ref 5.0–8.0)
Protein, ur: NEGATIVE mg/dL
SPECIFIC GRAVITY, URINE: 1.019 (ref 1.005–1.030)

## 2016-06-04 LAB — TROPONIN I

## 2016-06-04 LAB — LIPASE, BLOOD: Lipase: 24 U/L (ref 11–51)

## 2016-06-04 LAB — D-DIMER, QUANTITATIVE: D-Dimer, Quant: <0.27 ug/mL-FEU (ref 0.00–0.50)

## 2016-06-04 IMAGING — CR DG CHEST 1V PORT
1 series · 1 of 1 positions shown · non-contrast
Comparison: [DATE]

CLINICAL DATA: Central chest pain, shortness of breath.

EXAM:
PORTABLE CHEST 1 VIEW

[portable]
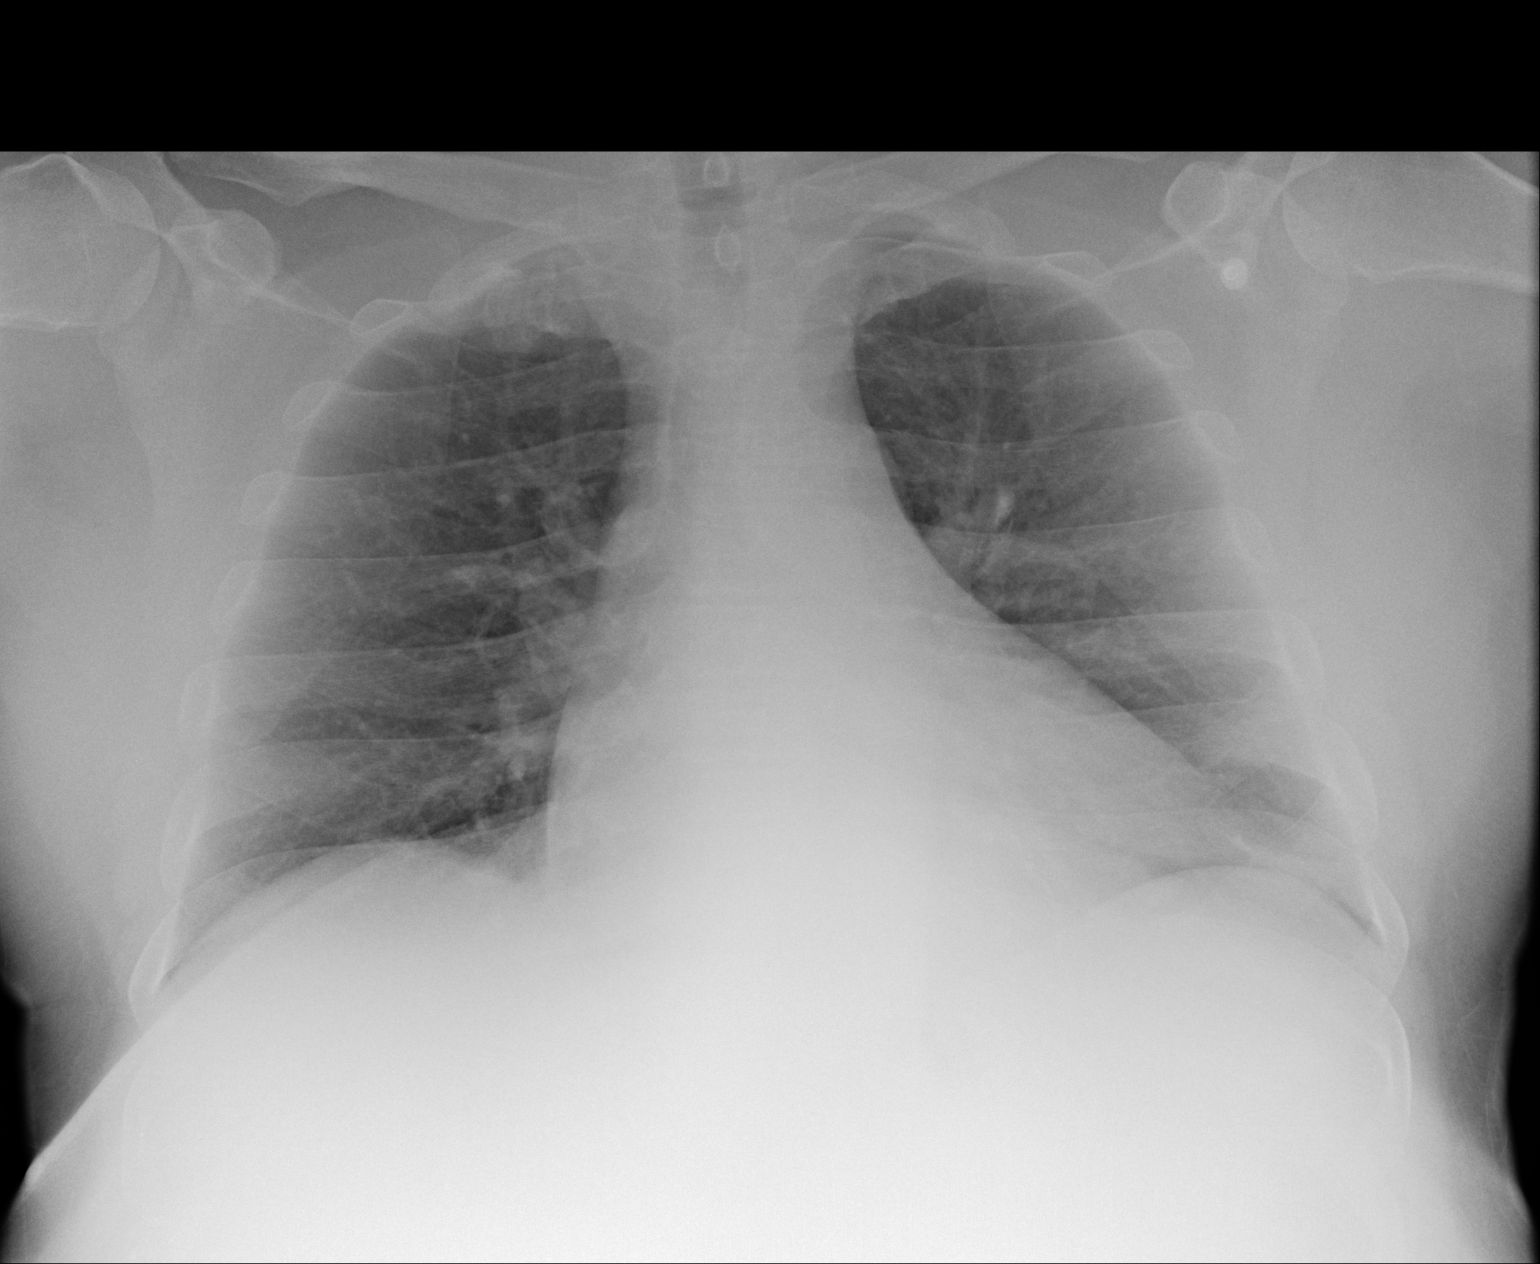

[1 of 1 positions shown; findings below may reference images not displayed]

FINDINGS: Mild cardiomegaly. Low lung volumes with left basilar opacity,
likely atelectasis. Right lung is clear. No effusions or edema. No
acute bony abnormality.
IMPRESSION: Mild cardiomegaly.

Low lung volumes.  Left base atelectasis.

## 2016-06-04 IMAGING — CT CT RENAL STONE PROTOCOL
2 of 4 series · 16 of 46 positions shown, 18 images · non-contrast
Comparison: None.

CLINICAL DATA: Right flank and back pain

EXAM:
CT ABDOMEN AND PELVIS WITHOUT CONTRAST
TECHNIQUE: Multidetector CT imaging of the abdomen and pelvis was performed
following the standard protocol without oral or intravenous contrast
material administration.

[Series 2: axial st · axial · 0.79mm/px · z∈[+1034,+1474]mm · 13 of 98 slices shown, 15 images]
[im 5/98  soft-tissue]
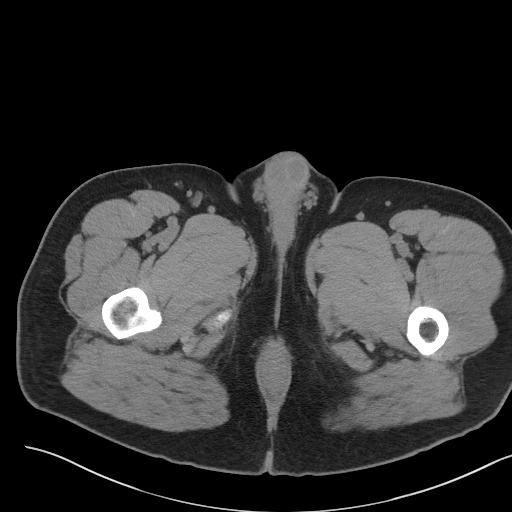
[im 5/98  bone]
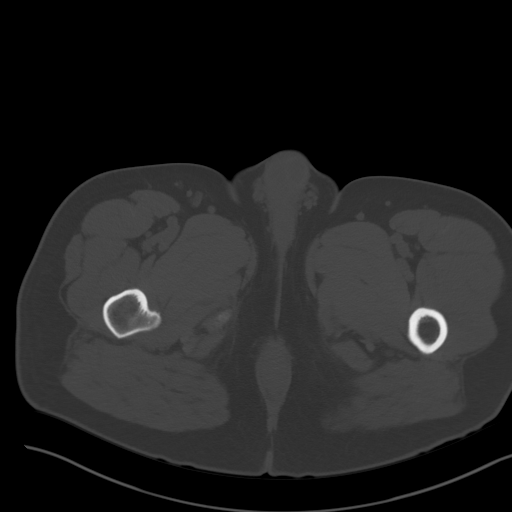
[im 13/98  soft-tissue]
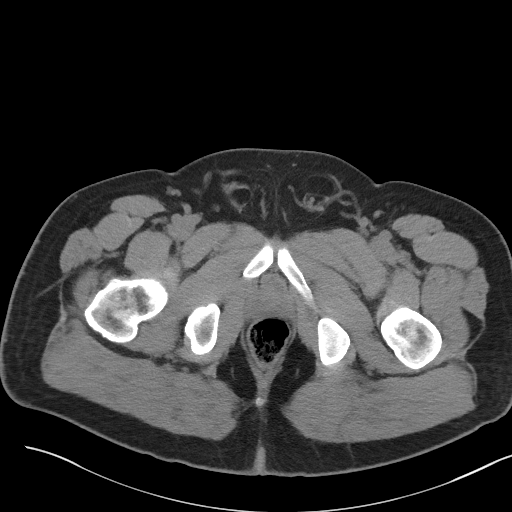
[im 22/98  soft-tissue]
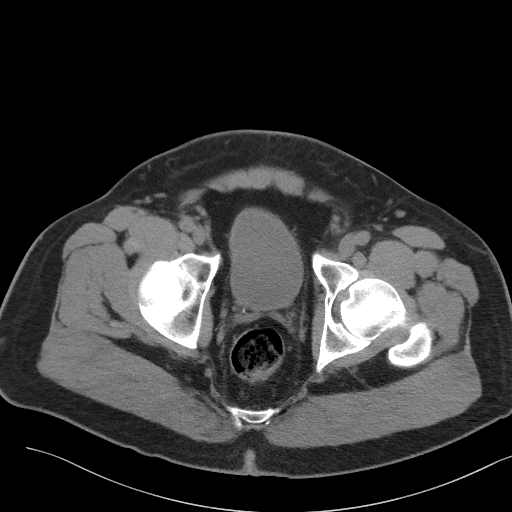
[im 26/98  soft-tissue]
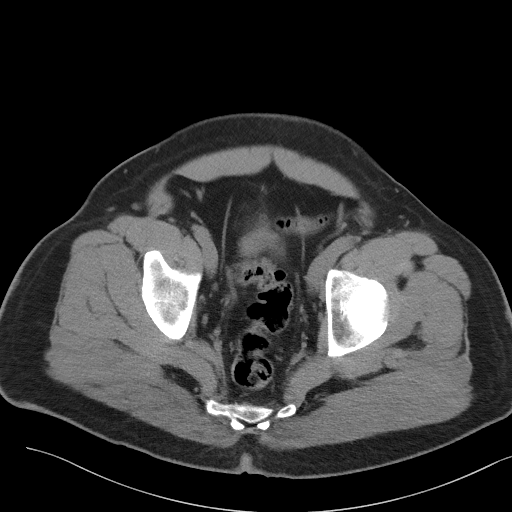
[im 34/98  soft-tissue]
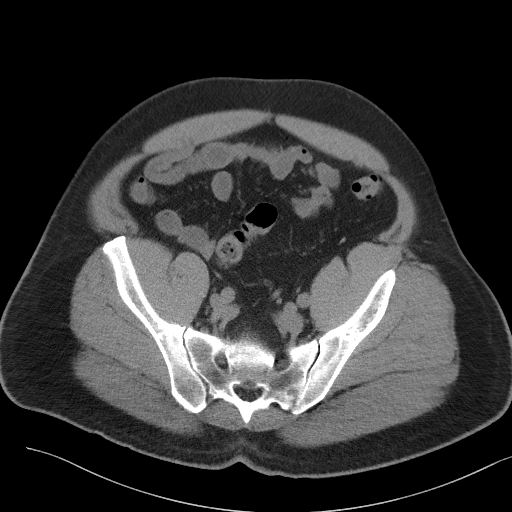
[im 43/98  soft-tissue]
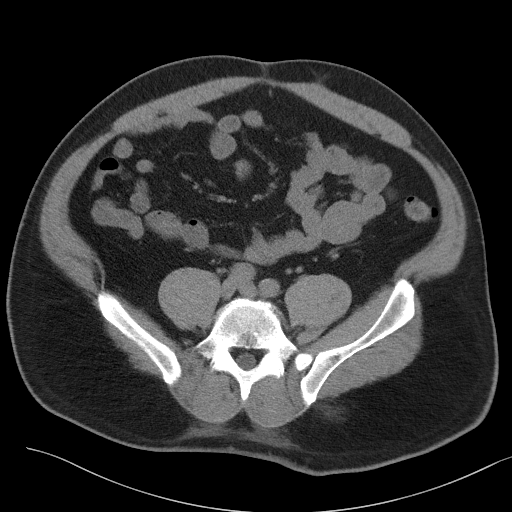
[im 51/98  soft-tissue]
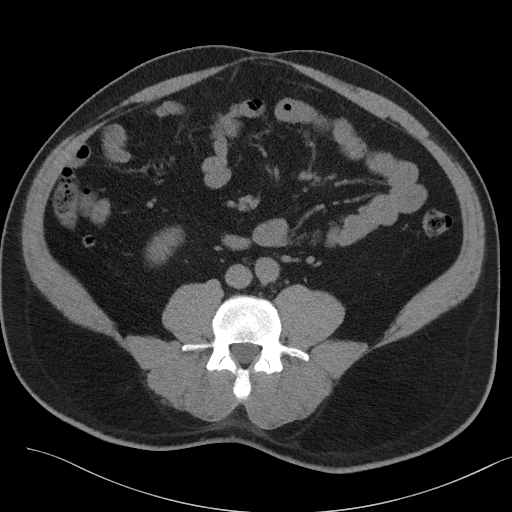
[im 55/98  soft-tissue]
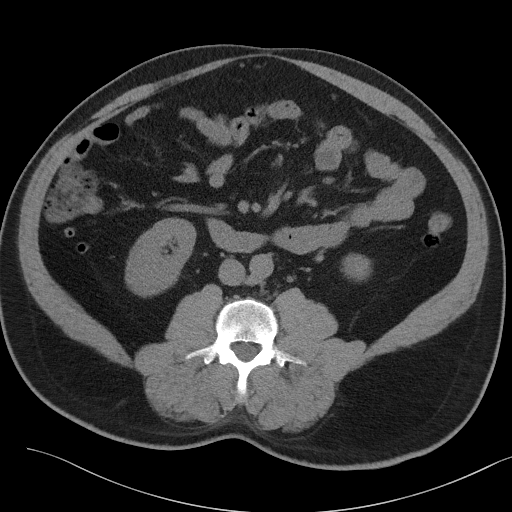
[im 64/98  soft-tissue]
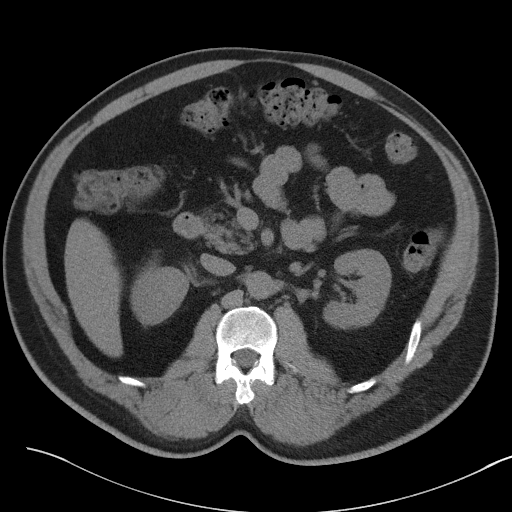
[im 64/98  bone]
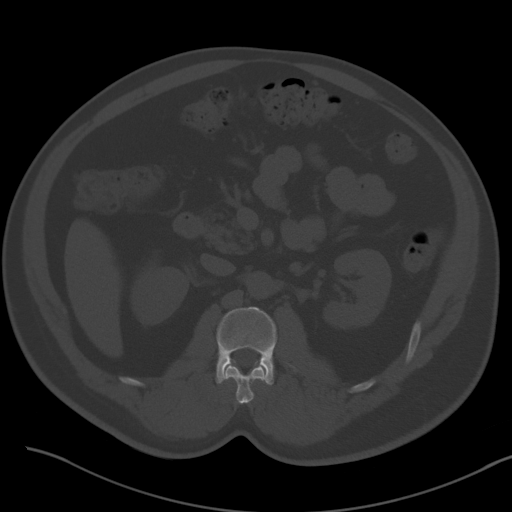
[im 72/98  soft-tissue]
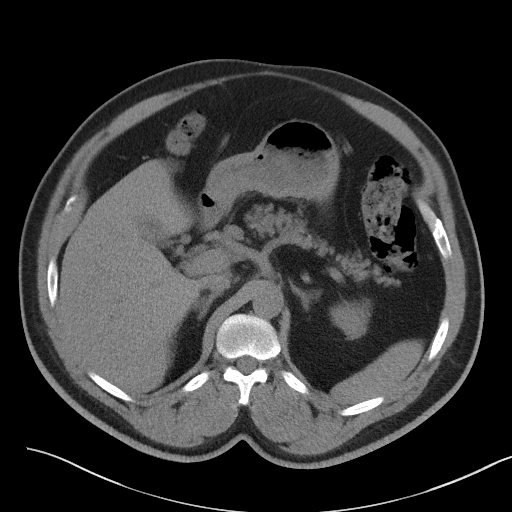
[im 76/98  soft-tissue]
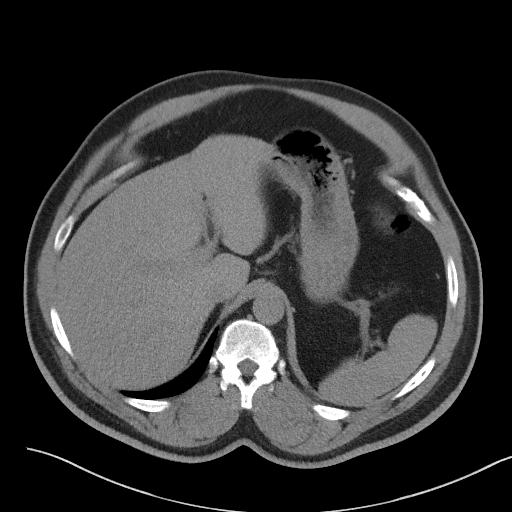
[im 85/98  soft-tissue]
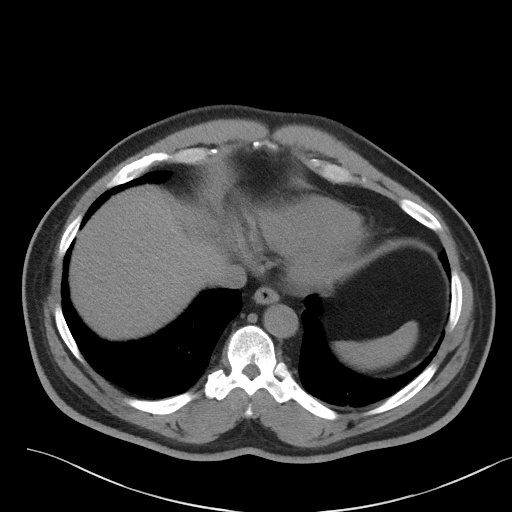
[im 93/98  soft-tissue]
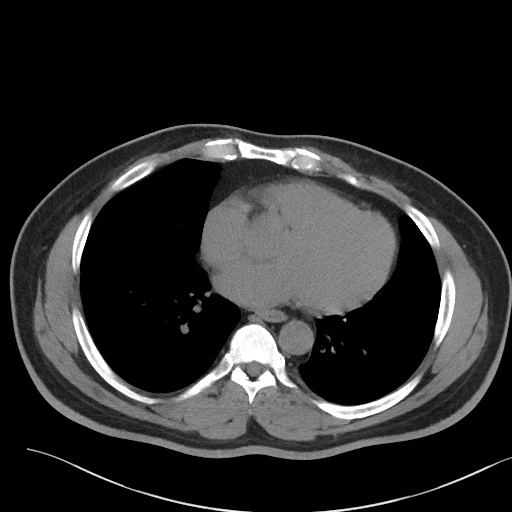

[Series 5: coronal st · coronal · 0.82mm/px · 3 of 112 slices shown]
[im 38/112  soft-tissue]
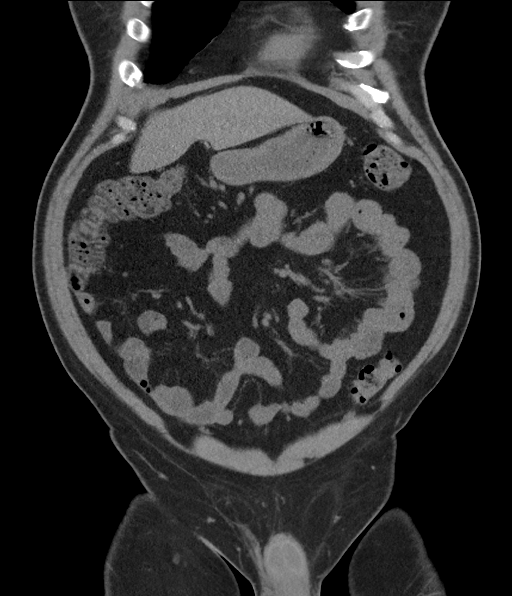
[im 50/112  soft-tissue]
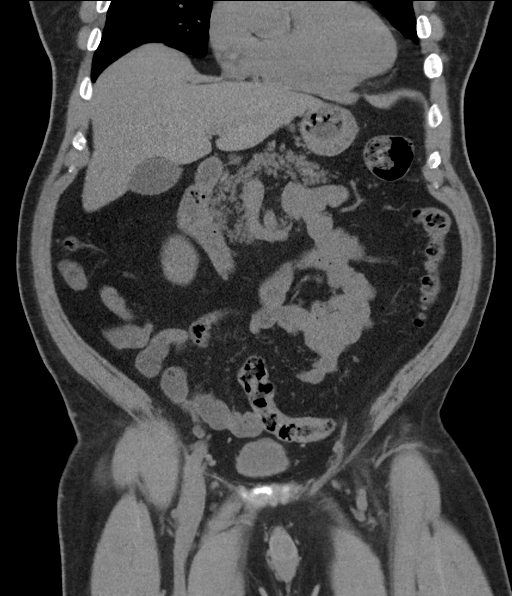
[im 62/112  soft-tissue]
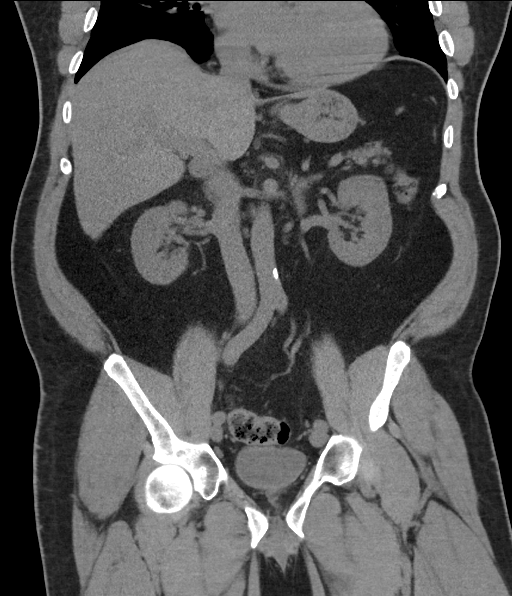

[16 of 46 positions shown; findings below may reference images not displayed]

FINDINGS: Lower chest: There is scarring in the left base. There is no lung
base edema or consolidation.

Hepatobiliary: No focal liver lesions are evident on this
noncontrast enhanced study. Gallbladder wall is not appreciably
thickened. There is no biliary duct dilatation.

Pancreas: There is no pancreatic mass or inflammatory focus.

Spleen: No splenic lesions are evident.

Adrenals/Urinary Tract: Adrenals appear normal bilaterally. Kidneys
bilaterally show no mass or hydronephrosis on either side. There is
no renal or ureteral calculus on either side. Urinary bladder is
midline with wall thickness within normal limits.

Stomach/Bowel: There are scattered diverticula throughout the colon
without diverticulitis. There is no bowel wall or mesenteric
thickening. There is no evident bowel obstruction. No free air or
portal venous air.

Vascular/Lymphatic: There are scattered foci of calcification in the
distal aorta. No aneurysm evident. Major mesenteric vessels appear
patent on this noncontrast enhanced study. There is a circumaortic
left renal vein, an anatomic variant.

Reproductive: Prostate and seminal vesicles appear normal in size
and contour. No pelvic mass.

Other: Appendix appears normal. There is no ascites or abscess in
the abdomen or pelvis. There is fat in each inguinal ring.

Musculoskeletal: There are no blastic or lytic bone lesions. There
is no intramuscular or abdominal wall lesion.
IMPRESSION: No renal or ureteral calculi.  No hydronephrosis.

Scattered colonic diverticula throughout the colon. No bowel
obstruction. No abscess. Appendix appears normal.

A cause for patient's symptoms has not been established with this
study.

## 2016-06-04 MED ORDER — SODIUM CHLORIDE 0.9 % IV BOLUS (SEPSIS)
1000.0000 mL | Freq: Once | INTRAVENOUS | Status: AC
  Administered 2016-06-04: 1000 mL via INTRAVENOUS

## 2016-06-04 MED ORDER — FENTANYL CITRATE (PF) 100 MCG/2ML IJ SOLN
50.0000 ug | Freq: Once | INTRAMUSCULAR | Status: AC
  Administered 2016-06-04: 50 ug via INTRAVENOUS
  Filled 2016-06-04: qty 2

## 2016-06-04 MED ORDER — ONDANSETRON HCL 4 MG/2ML IJ SOLN
4.0000 mg | Freq: Once | INTRAMUSCULAR | Status: AC
  Administered 2016-06-04: 4 mg via INTRAVENOUS
  Filled 2016-06-04: qty 2

## 2016-06-04 MED ORDER — OXYCODONE-ACETAMINOPHEN 5-325 MG PO TABS: 1.0000 | ORAL_TABLET | Freq: Four times a day (QID) | ORAL | 0 refills | Status: DC | PRN

## 2016-06-04 MED ORDER — ASPIRIN 81 MG PO CHEW
324.0000 mg | CHEWABLE_TABLET | Freq: Once | ORAL | Status: AC
  Administered 2016-06-04: 324 mg via ORAL
  Filled 2016-06-04: qty 4

## 2016-06-04 NOTE — ED Provider Notes (Signed)
[facility] DEPT Provider Note   CSN: [id number] Arrival date & time: 06/04/16  0800   By signing my name below, I, Eunice Blase, attest that this documentation has been prepared under the direction and in the presence of Sherwood Gambler, MD. Electronically signed, Eunice Blase, ED Scribe. 06/04/16. 9:01 AM.   History   Chief Complaint Chief Complaint  Patient presents with  . Back Pain   The history is provided by the patient and medical records. No language interpreter was used.    Craig Knight is a 51 y.o. male with h/o back pain, chest pain, HTN, tobacco use, type 2 DM, CHF and CKD, transported by his wife, who presents to the Emergency Department with concern for worsened chronic back pain x 1.5 week. He notes associated gait change d/t pain. Pt describes 10/10, constant, aching R lower back pain that is improved with rest and worsened with movement and walking. Pt notes h/o 1 episode of similar symptoms less severe. He denies injury, dysuria, hematuria, abdominal pain, weakness, numbness and incontinence.  Pt also expresses concern for lingering lightheadedness onset ~7 AM today after taking HTN medications and eating breakfast. He notes diaphoresis, worsened SOB from baseline, productive cough with clear sputum x 2-3 days, nausea x ~1 week and constipation. Had brief CP when this dizziness first started. Pt prescribed inhaler for baseline SOB without relief. He reports an appointment with his PCP today to discuss this.   Past Medical History:  Diagnosis Date  . Asthma   . Bronchitis   . Hypertension     Patient Active Problem List   Diagnosis Date Noted  . Hypertensive emergency 02/16/2016  . Hypertensive urgency 06/07/2015  . CKD (chronic kidney disease), stage III 06/07/2015  . Pain in the chest   . Elevated troponin   . Chronic diastolic CHF (congestive heart failure) ([place]) 09/17/2013  . DM type 2 (diabetes mellitus, type 2) ([place]) 09/15/2013  . Chest pain  08/01/2011  . HTN (hypertension) 08/01/2011  . Chest pain 07/22/2011  . Renal insufficiency 07/22/2011  . Tobacco abuse 07/22/2011  . Hypertension     Past Surgical History:  Procedure Laterality Date  . lipoma removal         Home Medications    Prior to Admission medications   Medication Sig Start Date End Date Taking? Authorizing Provider  albuterol (PROVENTIL HFA;VENTOLIN HFA) 108 (90 Base) MCG/ACT inhaler Inhale 2 puffs into the lungs every 6 (six) hours as needed for wheezing or shortness of breath. 05/29/16  Yes Praveen Mannam, MD  amLODipine (NORVASC) 10 MG tablet Take 1 tablet (10 mg total) by mouth daily. 03/05/16  Yes Lendon Colonel, NP  aspirin EC 81 MG tablet Take 1 tablet (81 mg total) by mouth daily. 10/13/13  Yes Arnoldo Lenis, MD  carvedilol (COREG) 6.25 MG tablet Take 1 tablet (6.25 mg total) by mouth 2 (two) times daily with a meal. 02/28/16  Yes Mikey Kirschner, MD  chlorthalidone (HYGROTON) 25 MG tablet Take 1 tablet (25 mg total) by mouth daily. 03/05/16  Yes Lendon Colonel, NP  isosorbide mononitrate (IMDUR) 60 MG 24 hr tablet Take 1 tablet (60 mg total) by mouth daily. 02/28/16  Yes Mikey Kirschner, MD  nitroGLYCERIN (NITROSTAT) 0.4 MG SL tablet Place 1 tablet (0.4 mg total) under the tongue every 5 (five) minutes as needed for chest pain. 02/28/16  Yes Mikey Kirschner, MD  Tiotropium Bromide-Olodaterol (STIOLTO RESPIMAT) 2.5-2.5 MCG/ACT AERS Inhale 2 puffs into  the lungs daily. 05/29/16  Yes Praveen Mannam, MD  oxyCODONE-acetaminophen (PERCOCET) 5-325 MG tablet Take 1-2 tablets by mouth every 6 (six) hours as needed for severe pain. 06/04/16   Sherwood Gambler, MD    Family History Family History  Problem Relation Age of Onset  . Stroke Mother   . Heart attack Father 48    CABG  . Hypertension Sister   . Stroke Brother   . Hypertension Brother     Social History Social History  Substance Use Topics  . Smoking status: Current Some Day Smoker     Packs/day: 0.50    Years: 25.00    Types: Cigarettes  . Smokeless tobacco: Former Systems developer    Quit date: 07/11/2015     Comment: a pack will last a week-05/29/2016  . Alcohol use 0.0 oz/week     Comment: occasionally     Allergies   Dust mite extract and Lisinopril   Review of Systems Review of Systems  Constitutional: Positive for diaphoresis. Negative for fever.  Respiratory: Positive for cough and shortness of breath.   Cardiovascular: Negative for chest pain.  Gastrointestinal: Positive for constipation and nausea. Negative for abdominal pain, diarrhea and vomiting.  Genitourinary: Negative for difficulty urinating (incontinence), dysuria and hematuria.  Musculoskeletal: Positive for arthralgias and gait problem.  Skin: Negative for wound.  Neurological: Positive for light-headedness. Negative for weakness and numbness.  All other systems reviewed and are negative.   Physical Exam Updated Vital Signs BP (!) 96/55 (BP Location: Right Arm)   Pulse 61   Temp 97.3 F (36.3 C) (Tympanic)   Resp 18   Ht 5\' 7"  (1.702 m)   Wt 219 lb (99.3 kg)   SpO2 97%   BMI 34.30 kg/m   Physical Exam  Constitutional: He is oriented to person, place, and time. He appears well-developed and well-nourished.  HENT:  Head: Normocephalic and atraumatic.  Right Ear: External ear normal.  Left Ear: External ear normal.  Nose: Nose normal.  Eyes: Right eye exhibits no discharge. Left eye exhibits no discharge.  Neck: Neck supple.  Cardiovascular: Normal rate, regular rhythm and normal heart sounds.   No murmur heard. Pulmonary/Chest: Effort normal and breath sounds normal. He has no wheezes. He has no rales.  Abdominal: Soft. There is tenderness (LLQ, mild).  Musculoskeletal: He exhibits no edema.  No midline back tenderness; mild R lumbar tenderness without swelling  Neurological: He is alert and oriented to person, place, and time.  Reflex Scores:      Patellar reflexes are 2+ on the right  side and 2+ on the left side.      Achilles reflexes are 2+ on the right side and 2+ on the left side. 5/5 strength in BLE; NL gross sensation  Skin: Skin is warm and dry.  Nursing note and vitals reviewed.    ED Treatments / Results  DIAGNOSTIC STUDIES: Oxygen Saturation is 97% on RA, NL by my interpretation.    COORDINATION OF CARE: 8:52 AM Discussed treatment plan with pt at bedside and pt agreed to plan. Will order IV fluids, medications, imaging and labs. 10:43 AM Pt reassessed. Pain mildly relieved, lingering lightheadedness. Will reassess.  Labs (all labs ordered are listed, but only abnormal results are displayed) Labs Reviewed  COMPREHENSIVE METABOLIC PANEL - Abnormal; Notable for the following:       Result Value   BUN 29 (*)    Creatinine, Ser 1.87 (*)    GFR calc non Af Amer 40 (*)  GFR calc Af Amer 46 (*)    All other components within normal limits  CBC WITH DIFFERENTIAL/PLATELET - Abnormal; Notable for the following:    WBC 13.6 (*)    Neutro Abs 10.3 (*)    All other components within normal limits  LIPASE, BLOOD  TROPONIN I  D-DIMER, QUANTITATIVE (NOT AT [facility])  URINALYSIS, ROUTINE W REFLEX MICROSCOPIC    EKG  EKG Interpretation  Date/Time:  Tuesday June 04 2016 09:32:52 EDT Ventricular Rate:  61 PR Interval:    QRS Duration: 89 QT Interval:  401 QTC Calculation: 404 R Axis:   -21 Text Interpretation:  Sinus rhythm Borderline left axis deviation Anterior infarct, old Nonspecific T abnormalities, lateral leads Mild ST elevation I, AVL otherwise ST/T changes unchanged Changes noted compared to Dec 2017 Confirmed by Regenia Skeeter MD, [place] [phone]) on 06/04/2016 9:57:42 AM       Radiology Dg Chest Portable 1 View  Result Date: 06/04/2016 CLINICAL DATA:  Central chest pain, shortness of breath. EXAM: PORTABLE CHEST 1 VIEW COMPARISON:  02/16/2016 FINDINGS: Mild cardiomegaly. Low lung volumes with left basilar opacity, likely atelectasis. Right lung is  clear. No effusions or edema. No acute bony abnormality. IMPRESSION: Mild cardiomegaly. Low lung volumes.  Left base atelectasis. Electronically Signed   By: Rolm Baptise M.D.   On: 06/04/2016 09:18   Ct Renal Stone Study  Result Date: 06/04/2016 CLINICAL DATA:  Right flank and back pain EXAM: CT ABDOMEN AND PELVIS WITHOUT CONTRAST TECHNIQUE: Multidetector CT imaging of the abdomen and pelvis was performed following the standard protocol without oral or intravenous contrast material administration. COMPARISON:  None. FINDINGS: Lower chest: There is scarring in the left base. There is no lung base edema or consolidation. Hepatobiliary: No focal liver lesions are evident on this noncontrast enhanced study. Gallbladder wall is not appreciably thickened. There is no biliary duct dilatation. Pancreas: There is no pancreatic mass or inflammatory focus. Spleen: No splenic lesions are evident. Adrenals/Urinary Tract: Adrenals appear normal bilaterally. Kidneys bilaterally show no mass or hydronephrosis on either side. There is no renal or ureteral calculus on either side. Urinary bladder is midline with wall thickness within normal limits. Stomach/Bowel: There are scattered diverticula throughout the colon without diverticulitis. There is no bowel wall or mesenteric thickening. There is no evident bowel obstruction. No free air or portal venous air. Vascular/Lymphatic: There are scattered foci of calcification in the distal aorta. No aneurysm evident. Major mesenteric vessels appear patent on this noncontrast enhanced study. There is a circumaortic left renal vein, an anatomic variant. Reproductive: Prostate and seminal vesicles appear normal in size and contour. No pelvic mass. Other: Appendix appears normal. There is no ascites or abscess in the abdomen or pelvis. There is fat in each inguinal ring. Musculoskeletal: There are no blastic or lytic bone lesions. There is no intramuscular or abdominal wall lesion.  IMPRESSION: No renal or ureteral calculi.  No hydronephrosis. Scattered colonic diverticula throughout the colon. No bowel obstruction. No abscess. Appendix appears normal. A cause for patient's symptoms has not been established with this study. Electronically Signed   By: Lowella Grip III M.D.   On: 06/04/2016 09:47    Procedures Procedures (including critical care time)  Medications Ordered in ED Medications  sodium chloride 0.9 % bolus 1,000 mL (0 mLs Intravenous Stopped 06/04/16 1222)  sodium chloride 0.9 % bolus 1,000 mL (0 mLs Intravenous Stopped 06/04/16 1311)  fentaNYL (SUBLIMAZE) injection 50 mcg (50 mcg Intravenous Given 06/04/16 0939)  ondansetron (ZOFRAN) injection  4 mg (4 mg Intravenous Given 06/04/16 0939)  aspirin chewable tablet 324 mg (324 mg Oral Given 06/04/16 1001)     Initial Impression / Assessment and Plan / ED Course  I have reviewed the triage vital signs and the nursing notes.  Pertinent labs & imaging results that were available during my care of the patient were reviewed by me and considered in my medical decision making (see chart for details).  Clinical Course as of Jun 04 1645  Tue Jun 04, 2016  0855 Pt is likely dizzy based on change in BP with BP in 90s. Will give fluids. No focal neuro deficits. Dyspnea could be from lightheadedness, will eval for ACS, anemia, PE. No fever. No obvious infection. CXR, labs, ECG, CT stone for the back/abd pain  [SG]  0945 ECG with ST elevations I, AVL, look like repol changes but new compared to previous. Other T changes stable. No current CP. Will c/s cardiology  [SG]  [phone] Dr Burt Knack reviewed ECG, not a STEMI. Denies CP at this time. Continue workup, AP cards c/s if needed.  [SG]  1211 Dizziness is better. BP now in low 100s. Almost done with fluids. He wants to go home and not be admitted. Will do orthostatics after fluids and monitor. Low threshold to admit.  [SG]    Clinical Course User Index [SG] Sherwood Gambler, MD     Patient's R low back pain is probably sciatica. Will treat with oxycodone given renal disease. Dizziness/dyspnea was probably from hypotension. He is on multiple BP meds, which in addition to bump in creatinine has now been over-effective and caused hypotension. After fluids he is asymptomatic and now doing well. BP stable in 100s consistently. Contacted his PCP, Dr Wolfgang Phoenix Nicki Reaper) who has reviewed chart. Will hold losartan, metformin and hydralazine. However he has significant HTN problems so we don't want to totally stop all HTN meds. Doubt ACS or PE. No renal stone. Neuro exam benign. Able to get up and ambulate. Dr Wolfgang Phoenix will see tomorrow in office. Strict return precautions  Final Clinical Impressions(s) / ED Diagnoses   Final diagnoses:  Hypotension, unspecified hypotension type  Acute right-sided low back pain without sciatica    New Prescriptions Discharge Medication List as of 06/04/2016  1:36 PM    START taking these medications   Details  oxyCODONE-acetaminophen (PERCOCET) 5-325 MG tablet Take 1-2 tablets by mouth every 6 (six) hours as needed for severe pain., Starting Tue 06/04/2016, Print       I personally performed the services described in this documentation, which was scribed in my presence. The recorded information has been reviewed and is accurate.    Sherwood Gambler, MD 06/04/16 1650

## 2016-06-04 NOTE — Discharge Instructions (Signed)
Stop taking hydralazine, losartan, and metformin. See Dr. Wolfgang Phoenix tomorrow at 1:45 PM. If you have recurrent dizziness or lightheadedness, come back to the ER for evaluation. Bring your medicines to your appointment tomorrow

## 2016-06-04 NOTE — ED Triage Notes (Signed)
Pt c/o chronic back pain. X 1 week now. Last night started runing down r leg and now having to use cane due to pain. Pt also c/o dizziness since taking bp meds this am. bp 91/53. nad.

## 2016-06-04 NOTE — Telephone Encounter (Signed)
I spoke to the ER doctor. Patient was in the ER with low blood pressure feeling dizzy originally went there because of back pain. ER doctor ran some tests gave him some IV fluids. Creatinine elevated. ER doctor spoke with me. I instructed after consultation with the ER doctor for them to have the patient hold hydralazine, stop metformin stop losartan-because of elevated creatinine and low her blood pressure-and be rechecked on Wednesday morning with Dr. Richardson Landry then at that point can decide regarding medication regimen. ER with setting up appointment for Wednesday morning.

## 2016-06-04 NOTE — ED Notes (Signed)
EKG was done on Pt

## 2016-06-04 NOTE — ED Notes (Signed)
Pt mentions intermittent cp in center of chest without radiation x6 months with SOB for "a while."

## 2016-06-05 ENCOUNTER — Ambulatory Visit (INDEPENDENT_AMBULATORY_CARE_PROVIDER_SITE_OTHER): Payer: BLUE CROSS/BLUE SHIELD | Admitting: Family Medicine

## 2016-06-05 ENCOUNTER — Encounter: Payer: Self-pay | Admitting: Family Medicine

## 2016-06-05 VITALS — BP 110/70 | Temp 98.1°F | Ht 67.0 in | Wt 223.0 lb

## 2016-06-05 DIAGNOSIS — N183 Chronic kidney disease, stage 3 unspecified: Secondary | ICD-10-CM

## 2016-06-05 DIAGNOSIS — N289 Disorder of kidney and ureter, unspecified: Secondary | ICD-10-CM | POA: Diagnosis not present

## 2016-06-05 DIAGNOSIS — J449 Chronic obstructive pulmonary disease, unspecified: Secondary | ICD-10-CM

## 2016-06-05 DIAGNOSIS — I5032 Chronic diastolic (congestive) heart failure: Secondary | ICD-10-CM | POA: Diagnosis not present

## 2016-06-05 DIAGNOSIS — R21 Rash and other nonspecific skin eruption: Secondary | ICD-10-CM | POA: Diagnosis not present

## 2016-06-05 DIAGNOSIS — E119 Type 2 diabetes mellitus without complications: Secondary | ICD-10-CM | POA: Diagnosis not present

## 2016-06-05 DIAGNOSIS — J432 Centrilobular emphysema: Secondary | ICD-10-CM

## 2016-06-05 DIAGNOSIS — I10 Essential (primary) hypertension: Secondary | ICD-10-CM

## 2016-06-05 MED ORDER — SULFAMETHOXAZOLE-TRIMETHOPRIM 800-160 MG PO TABS: 1.0000 | ORAL_TABLET | Freq: Two times a day (BID) | ORAL | 0 refills | Status: DC

## 2016-06-05 MED ORDER — OXYCODONE-ACETAMINOPHEN 7.5-325 MG PO TABS: ORAL_TABLET | ORAL | 0 refills | Status: DC

## 2016-06-05 MED ORDER — PREDNISONE 20 MG PO TABS: ORAL_TABLET | ORAL | 0 refills | Status: DC

## 2016-06-05 MED ORDER — METFORMIN HCL 500 MG PO TABS: 500.0000 mg | ORAL_TABLET | Freq: Every day | ORAL | 0 refills | Status: DC

## 2016-06-05 NOTE — Progress Notes (Signed)
   Subjective:    Patient ID: Craig Knight, male    DOB: 25-Nov-1965, 51 y.o.   MRN: [id number] Patient arrives office for an extremely protracted visit regarding recent trips to the emergency room along with specialty management. Back Pain  This is a new problem. Episode onset: 2 weeks ago. The pain is present in the lumbar spine. Treatments tried: aleve.   Went to ED yesterday for back pain.   Using stiolto respimate 2 puffs daily prescribed by specialist. Pt states not helping with breathing. Wonders if he should keep taking it. Just started it a week ago.  Pulmonary function tests reviewed. Patient had minimal response to bronchodilator. Tests consistent with COPD.  Severe pain right low lumbar region, two wks ago, tried to do ome work, did soe lifting, took a tire up, jasked and pulled on a stick, had severe pain, tried heating pad and cold pad, aching bad , Moderately severe pain.  Had a CT scan to evaluate for potential stones. CT scan reviewed with patient. No obstructive stones evident  Pt stopped losartan and hydralazine due to low blood pressure. Of note echocardiogram recently did show severe left ventricular hypertrophy.   and metformin was stopped due to probable renal insufficiency   Sores on buttock. Had been there for about a couple weeks start a small box painful in nature  Review of Systems  Musculoskeletal: Positive for back pain.  No vomiting no headache no chest pain     Objective:   Physical Exam  Alert and oriented, vitals reviewed and stable, NAD ENT-TM's and ext canals WNL bilat via otoscopic exam Soft palate, tonsils and post pharynx WNL via oropharyngeal exam Neck-symmetric, no masses; thyroid nonpalpable and nontender Pulmonary-no tachypnea or accessory muscle use; Clear without wheezes via auscultation Card--no abnrml murmurs, rhythm reg and rate WNL Carotid pulses symmetric, without bruits Negative straight leg raise on right positive SI notch  tenderness positive small papules folliculitis eruption on buttocks      Assessment & Plan:  Impression 1 hypertension with history of severe ventricular hypertrophy. Recent hypo-tension. Now down off some meds. Will watch closely. #2 folliculitis eruption buttocks discuss #3 back pain not sciatica though this is what it was called by the emergency room. More like SI joint pain. Exquisitely tender #4 type 2 diabetes discussed. Patient has been off and on with his metformin lately. Strongly encouraged to get on it regularly. Told him I would meet him halfway and start with just 1 tablet daily. #5 COPD moderately severe. 2 early to tell if bronchodilator/steroid combination will help or not. Discussed. Exercise encourage. Strongly encouraged to stop smoking. Follow-up in a month for follow-up of above issues plus wellness exam.  Greater than 50% of this 45 minute face to face visit was spent in counseling and discussion and coordination of care regarding the above diagnosis/diagnosies

## 2016-06-07 ENCOUNTER — Institutional Professional Consult (permissible substitution): Payer: BLUE CROSS/BLUE SHIELD | Admitting: Pulmonary Disease

## 2016-06-08 DIAGNOSIS — J449 Chronic obstructive pulmonary disease, unspecified: Secondary | ICD-10-CM | POA: Insufficient documentation

## 2016-06-08 DIAGNOSIS — J439 Emphysema, unspecified: Secondary | ICD-10-CM | POA: Insufficient documentation

## 2016-07-03 ENCOUNTER — Encounter: Payer: Self-pay | Admitting: Family Medicine

## 2016-07-03 ENCOUNTER — Ambulatory Visit (INDEPENDENT_AMBULATORY_CARE_PROVIDER_SITE_OTHER): Payer: BLUE CROSS/BLUE SHIELD | Admitting: Family Medicine

## 2016-07-03 VITALS — BP 110/80 | Ht 67.0 in | Wt 218.1 lb

## 2016-07-03 DIAGNOSIS — Z0000 Encounter for general adult medical examination without abnormal findings: Secondary | ICD-10-CM

## 2016-07-03 MED ORDER — NAPROXEN 500 MG PO TABS: ORAL_TABLET | ORAL | 2 refills | Status: DC

## 2016-07-03 MED ORDER — CARVEDILOL 6.25 MG PO TABS: 6.2500 mg | ORAL_TABLET | Freq: Two times a day (BID) | ORAL | 5 refills | Status: DC

## 2016-07-03 MED ORDER — ISOSORBIDE MONONITRATE ER 60 MG PO TB24: 60.0000 mg | ORAL_TABLET | Freq: Every day | ORAL | 5 refills | Status: DC

## 2016-07-03 MED ORDER — METFORMIN HCL 500 MG PO TABS: 500.0000 mg | ORAL_TABLET | Freq: Every day | ORAL | 5 refills | Status: DC

## 2016-07-03 NOTE — Progress Notes (Signed)
   Subjective:    Patient ID: Craig Knight, male    DOB: [birth year], 51 y.o.   MRN: [id number]  HPI The patient comes in today for a wellness visit.    A review of their health history was completed.  A review of medications was also completed.  Any needed refills; Yes   Eating habits: Patient states eating habits are normal. Trying to cut back on amount of food.   Falls/  MVA accidents in past few months: None   Regular exercise: States does not exercise  Specialist pt sees on regular basis: Dr.Mannam  Preventative health issues were discussed.   Additional concerns: Also has concerns of back pain.    Patient claims compliance with diabetes medication. No obvious side effects. Reports no substantial low sugar spells. Most numbers are generally in good range when checked fasting. Generally does not miss a dose of medication. Watching diabetic diet closely  round 90  Review of Systems  Constitutional: Negative for activity change, appetite change and fever.  HENT: Negative for congestion and rhinorrhea.   Eyes: Negative for discharge.  Respiratory: Negative for cough and wheezing.   Cardiovascular: Negative for chest pain.  Gastrointestinal: Negative for abdominal pain, blood in stool and vomiting.  Genitourinary: Negative for difficulty urinating and frequency.  Musculoskeletal: Negative for neck pain.  Skin: Negative for rash.  Allergic/Immunologic: Negative for environmental allergies and food allergies.  Neurological: Negative for weakness and headaches.  Psychiatric/Behavioral: Negative for agitation.  All other systems reviewed and are negative.      Objective:   Physical Exam  Constitutional: He appears well-developed and well-nourished.  HENT:  Head: Normocephalic and atraumatic.  Right Ear: External ear normal.  Left Ear: External ear normal.  Nose: Nose normal.  Mouth/Throat: Oropharynx is clear and moist.  Eyes: EOM are normal. Pupils are equal,  round, and reactive to light.  Neck: Normal range of motion. Neck supple. No thyromegaly present.  Cardiovascular: Normal rate, regular rhythm and normal heart sounds.   No murmur heard. Pulmonary/Chest: Effort normal and breath sounds normal. No respiratory distress. He has no wheezes.  Abdominal: Soft. Bowel sounds are normal. He exhibits no distension and no mass. There is no tenderness.  Genitourinary: Penis normal.  Genitourinary Comments: Prostate gland mild diffuse enlargement consistent with age  Musculoskeletal: Normal range of motion. He exhibits no edema.  Lymphadenopathy:    He has no cervical adenopathy.  Neurological: He is alert. He exhibits normal muscle tone.  Skin: Skin is warm and dry. No erythema.  Psychiatric: He has a normal mood and affect. His behavior is normal. Judgment normal.  Vitals reviewed.         Assessment & Plan:  Impression 1 wellness exam #2 type 2 diabetes discussed #3 hypertension good control plan diet discussed. Exercise discussed medications refilled. GI referral for colonoscopy rationale discussed

## 2016-07-05 ENCOUNTER — Encounter: Payer: Self-pay | Admitting: Family Medicine

## 2016-07-08 ENCOUNTER — Encounter: Payer: Self-pay | Admitting: Family Medicine

## 2016-07-30 ENCOUNTER — Encounter: Payer: Self-pay | Admitting: Pulmonary Disease

## 2016-07-30 ENCOUNTER — Ambulatory Visit (INDEPENDENT_AMBULATORY_CARE_PROVIDER_SITE_OTHER): Payer: BLUE CROSS/BLUE SHIELD | Admitting: Pulmonary Disease

## 2016-07-30 VITALS — BP 134/64 | HR 80 | Ht 67.0 in | Wt 221.0 lb

## 2016-07-30 DIAGNOSIS — J449 Chronic obstructive pulmonary disease, unspecified: Secondary | ICD-10-CM | POA: Diagnosis not present

## 2016-07-30 DIAGNOSIS — R0602 Shortness of breath: Secondary | ICD-10-CM

## 2016-07-30 DIAGNOSIS — G4719 Other hypersomnia: Secondary | ICD-10-CM

## 2016-07-30 LAB — NITRIC OXIDE: Nitric Oxide: 9

## 2016-07-30 MED ORDER — FLUTICASONE-UMECLIDIN-VILANT 100-62.5-25 MCG/INH IN AEPB: 1.0000 | INHALATION_SPRAY | Freq: Every day | RESPIRATORY_TRACT | 5 refills | Status: DC

## 2016-07-30 MED ORDER — ALBUTEROL SULFATE HFA 108 (90 BASE) MCG/ACT IN AERS: 2.0000 | INHALATION_SPRAY | Freq: Four times a day (QID) | RESPIRATORY_TRACT | 2 refills | Status: DC | PRN

## 2016-07-30 MED ORDER — FLUTICASONE-UMECLIDIN-VILANT 100-62.5-25 MCG/INH IN AEPB: 1.0000 | INHALATION_SPRAY | Freq: Every day | RESPIRATORY_TRACT | 0 refills | Status: AC

## 2016-07-30 MED ORDER — FLUTICASONE-UMECLIDIN-VILANT 100-62.5-25 MCG/INH IN AEPB: 1.0000 | INHALATION_SPRAY | Freq: Every day | RESPIRATORY_TRACT | 0 refills | Status: DC

## 2016-07-30 NOTE — Addendum Note (Signed)
Addended by: Maryanna Shape A on: 07/30/2016 09:32 AM   Modules accepted: Orders

## 2016-07-30 NOTE — Patient Instructions (Addendum)
I'm sorry to hear that the stiolto was not working We will try Trelegy inhaler and given albuterol rescue inhaler We need to work on quitting smoking as soon as possible We will check with insurance company if they will pay for the CPAP. If declined then we will need to repeat your sleep study Please work on weight loss and exercise  Return to clinic in 3 months.

## 2016-07-30 NOTE — Progress Notes (Signed)
Craig Knight    [id number]    [birth year]  Primary Care Physician:Luking, Grace Bushy, MD  Referring Physician: Mikey Kirschner, [address] [place], [place] [address]  Chief complaint:  Follow up for  COPD GOLD B (CAT score 29, 0 exacerbations)  HPI: Craig Knight is 51 year old with past medical history of childhood asthma, hypertension, active smoker. He was diagnosed with asthma as a child which got better in adulthood. 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 He reports mainly dyspnea on exertion and does not have dyspnea on rest, wheezing. He has daily symptoms of cough with white sputum production. He is to have an albuterol rescue inhaler but is not sure if it helped with the symptoms. He is currently not on any inhalers. He does not have seasonal allergies, sensitivities to cats, dogs, strong perfumes, no heartburn symptoms  He has over 38-pack-year smoking history and is trying to quit. He is down to 1 pack per week now. 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  Interim history: He was started on stiolto at last visit. He feels that this is not helping. He still has dyspnea on exertion, daily cough with white sputum, wheezing. Denies any fevers, chills, hemoptysis. He was seen in the emergency room and April 2018 for back pain. He had a chest x-ray and a CT abdomen which shows mild scarring, atelectasis at the left base with no infiltrate, fibrosis.  Outpatient Encounter Prescriptions as of 07/30/2016  Medication Sig  . amLODipine (NORVASC) 10 MG tablet Take 1 tablet (10 mg total) by mouth daily.  Marland Kitchen aspirin EC 81 MG tablet Take 1 tablet (81 mg total) by mouth daily.  . carvedilol (COREG) 6.25 MG tablet Take 1 tablet (6.25 mg total) by mouth 2 (two) times daily with a meal.  . chlorthalidone (HYGROTON) 25 MG tablet Take 1 tablet (25  mg total) by mouth daily.  . isosorbide mononitrate (IMDUR) 60 MG 24 hr tablet Take 1 tablet (60 mg total) by mouth daily.  . metFORMIN (GLUCOPHAGE) 500 MG tablet Take 1 tablet (500 mg total) by mouth daily with breakfast.  . naproxen (NAPROSYN) 500 MG tablet Take 1 tablet by mouth twice daily with food as needed for pain  . nitroGLYCERIN (NITROSTAT) 0.4 MG SL tablet Place 1 tablet (0.4 mg total) under the tongue every 5 (five) minutes as needed for chest pain.  . Tiotropium Bromide-Olodaterol (STIOLTO RESPIMAT) 2.5-2.5 MCG/ACT AERS Inhale 2 puffs into the lungs daily.  . [DISCONTINUED] oxyCODONE-acetaminophen (PERCOCET) 7.5-325 MG tablet Take one every 4 -6 hours prn pain  . [DISCONTINUED] predniSONE (DELTASONE) 20 MG tablet Take 3 tabs for 3 days, then 2 tabs for 3 days, then one tab for 2 days.   No facility-administered encounter medications on file as of 07/30/2016.     Allergies as of 07/30/2016 - Review Complete 07/30/2016  Allergen Reaction Noted  . Dust mite extract  10/13/2013  . Lisinopril Cough 06/07/2015    Past Medical History:  Diagnosis Date  . Asthma   . Bronchitis   . Hypertension     Past Surgical History:  Procedure Laterality Date  . lipoma removal      Family History  Problem Relation Age of Onset  . Stroke Mother   . Heart attack Father 54  CABG  . Hypertension Sister   . Stroke Brother   . Hypertension Brother     Social History   Social History  . Marital status: Married    Spouse name: N/A  . Number of children: N/A  . Years of education: N/A   Occupational History  . Not on file.   Social History Main Topics  . Smoking status: Current Some Day Smoker    Packs/day: 0.00    Years: 25.00    Types: Cigarettes  . Smokeless tobacco: Former Systems developer    Quit date: 07/11/2015     Comment: a pack will last a month-05/29/2016  . Alcohol use 0.0 oz/week     Comment: occasionally  . Drug use: No  . Sexual activity: Yes    Birth control/  protection: None   Other Topics Concern  . Not on file   Social History Narrative  . No narrative on file    Review of systems: Review of Systems  Constitutional: Negative for fever and chills.  HENT: Negative.   Eyes: Negative for blurred vision.  Respiratory: as per HPI  Cardiovascular: Negative for chest pain and palpitations.  Gastrointestinal: Negative for vomiting, diarrhea, blood per rectum. Genitourinary: Negative for dysuria, urgency, frequency and hematuria.  Musculoskeletal: Negative for myalgias, back pain and joint pain.  Skin: Negative for itching and rash.  Neurological: Negative for dizziness, tremors, focal weakness, seizures and loss of consciousness.  Endo/Heme/Allergies: Negative for environmental allergies.  Psychiatric/Behavioral: Negative for depression, suicidal ideas and hallucinations.  All other systems reviewed and are negative.  Physical Exam: Blood pressure 134/64, pulse 80, height 5\' 7"  (1.702 m), weight 221 lb (100.2 kg), SpO2 96 %. Gen:      No acute distress HEENT:  EOMI, sclera anicteric Neck:     No masses; no thyromegaly Lungs:    Clear to auscultation bilaterally; normal respiratory effort CV:         Regular rate and rhythm; no murmurs Abd:      + bowel sounds; soft, non-tender; no palpable masses, no distension Ext:    No edema; adequate peripheral perfusion Skin:      Warm and dry; no rash Neuro: alert and oriented x 3 Psych: normal mood and affect  Data Reviewed: FENO 05/29/16- 12 FENO 07/30/16- 9  PFTs 04/24/16 FVC 2.01 (62%) FEV1 1.65 (53%) F/F 69 TLC 75%, RV/TLC 130% ERV 30% DLCO 65%, DLCO/VA 102% Moderate to severe obstructive airway disease with no bronchodilator response. Moderate diffusion impairment which corrects for alveolar volume Mild reduction in total lung capacity, airtrapping.  Chest x-ray 02/16/16-no acute cardio pulmonary abnormality. Chest x-ray 06/04/16- cardio megaly, left base atelectasis CT abdomen  06/04/16- mild left base atelectasis with scarring. No acute infiltrate or fibrosis I reviewed all images personally.  CBC with diff 06/04/16- WBC 13.6. Eos 2%, absolute eos count 272 Blood allergy profile- IgE 133, sensitive to weed, grass and elm.   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 Grade 1   diastolic dysfunction. Mild left atrial enlargement. Trivial   tricuspid regurgitation.  Sleep study 12/23/13 Mild sleep apnea.  Assessment:  COPD GOLD B Review of his PFTs show significant obstruction with no bronchodilator response. In addition he has air trapping consistent with emphysema. There is no bronchodilator response, FENO is low and his clinical picture is not consistent with asthma. There is mild restriction with marked reduction in ERV secondary to body habitus and obesity. There  is no suspicion for interstitial lung disease on his chest imaging  As stiolto is not working we will give trelegy and assess response. I suspect his dyspnea is multifactorial from obesity, deconditioning diastolic dysfunction, chronic kidney disease in addition to COPD. I have asked him to start a program of weight loss and gradual exercise.  Active smoker  Continue to smoke but is trying to quit. He does not want any help quitting and want to do it on his own. We will reassess at next visit to see if he will need nicotine replacement therapies or other aids to quit. Time spent counseling 5 mins.  Suspected sleep apnea Sleep study in 2015 which showed mild sleep apnea. He has gained weight in the meantime with symptoms of snoring. We will reassess with a split-night sleep study.  Plan/Recommendations: - Stop stiolto, start trelegy, give albuterol rescue inhaler - Weight loss and exercise - Smoking cessation.  - Split night sleep study  Marshell Garfinkel MD [facility] Pager [phone]  [phone] 07/30/2016, 9:00 AM  CC: Mikey Kirschner, MD

## 2016-08-01 ENCOUNTER — Other Ambulatory Visit: Payer: Self-pay | Admitting: Family Medicine

## 2016-08-01 ENCOUNTER — Telehealth: Payer: Self-pay | Admitting: Internal Medicine

## 2016-08-01 NOTE — Telephone Encounter (Signed)
Patient called for DS. He received a letter to be triaged. [phone]

## 2016-08-05 ENCOUNTER — Ambulatory Visit: Payer: BLUE CROSS/BLUE SHIELD | Attending: Pulmonary Disease | Admitting: Neurology

## 2016-08-05 ENCOUNTER — Encounter (INDEPENDENT_AMBULATORY_CARE_PROVIDER_SITE_OTHER): Payer: Self-pay

## 2016-08-05 DIAGNOSIS — G4710 Hypersomnia, unspecified: Secondary | ICD-10-CM | POA: Diagnosis present

## 2016-08-05 DIAGNOSIS — G4719 Other hypersomnia: Secondary | ICD-10-CM

## 2016-08-05 DIAGNOSIS — G4733 Obstructive sleep apnea (adult) (pediatric): Secondary | ICD-10-CM | POA: Diagnosis not present

## 2016-08-06 ENCOUNTER — Telehealth: Payer: Self-pay

## 2016-08-06 NOTE — Telephone Encounter (Signed)
See separate triage.  

## 2016-08-07 ENCOUNTER — Other Ambulatory Visit: Payer: Self-pay

## 2016-08-07 DIAGNOSIS — Z1211 Encounter for screening for malignant neoplasm of colon: Secondary | ICD-10-CM

## 2016-08-07 NOTE — Telephone Encounter (Signed)
Gastroenterology Pre-Procedure Review  Request Date: 08/06/2016 Requesting Physician: Dr. Baltazar Apo  PATIENT REVIEW QUESTIONS: The patient responded to the following health history questions as indicated:    1. Diabetes Melitis: YES 2. Joint replacements in the past 12 months: no 3. Major health problems in the past 3 months: no 4. Has an artificial valve or MVP: no 5. Has a defibrillator: no 6. Has been advised in past to take antibiotics in advance of a procedure like teeth cleaning: no 7. Family history of colon cancer: no  8. Alcohol Use: no 9. History of sleep apnea: Recently tested but has to be tested again 10. History of coronary artery or other vascular stents placed within the last 12 months: no    MEDICATIONS & ALLERGIES:    Patient reports the following regarding taking any blood thinners:   Plavix? no Aspirin? no Coumadin? no Brilinta? no Xarelto? no Eliquis? no Pradaxa? no Savaysa? no Effient? no  Patient confirms/reports the following medications:  Current Outpatient Prescriptions  Medication Sig Dispense Refill  . albuterol (PROVENTIL HFA;VENTOLIN HFA) 108 (90 Base) MCG/ACT inhaler Inhale 2 puffs into the lungs every 6 (six) hours as needed for wheezing or shortness of breath. 1 Inhaler 2  . amLODipine (NORVASC) 10 MG tablet Take 1 tablet (10 mg total) by mouth daily. 30 tablet 11  . aspirin EC 81 MG tablet Take 1 tablet (81 mg total) by mouth daily.    . carvedilol (COREG) 6.25 MG tablet Take 1 tablet (6.25 mg total) by mouth 2 (two) times daily with a meal. 60 tablet 5  . chlorthalidone (HYGROTON) 25 MG tablet Take 1 tablet (25 mg total) by mouth daily. 90 tablet 2  . Fluticasone-Umeclidin-Vilant (TRELEGY ELLIPTA) 100-62.5-25 MCG/INH AEPB Inhale 1 puff into the lungs daily. 28 each 5  . isosorbide mononitrate (IMDUR) 60 MG 24 hr tablet Take 1 tablet (60 mg total) by mouth daily. 30 tablet 5  . metFORMIN (GLUCOPHAGE) 500 MG tablet Take 1 tablet (500 mg  total) by mouth daily with breakfast. 30 tablet 5  . naproxen (NAPROSYN) 500 MG tablet Take 1 tablet by mouth twice daily with food as needed for pain 36 tablet 2  . Tiotropium Bromide-Olodaterol (STIOLTO RESPIMAT) 2.5-2.5 MCG/ACT AERS Inhale 2 puffs into the lungs daily. 1 Inhaler 5  . nitroGLYCERIN (NITROSTAT) 0.4 MG SL tablet Place 1 tablet (0.4 mg total) under the tongue every 5 (five) minutes as needed for chest pain. (Patient not taking: Reported on 08/06/2016) 30 tablet 0   No current facility-administered medications for this visit.     Patient confirms/reports the following allergies:  Allergies  Allergen Reactions  . Dust Mite Extract   . Lisinopril Cough    No orders of the defined types were placed in this encounter.   AUTHORIZATION INFORMATION Primary Insurance:   ID #: Group #:  Pre-Cert / Auth required: Pre-Cert / Auth #:   Secondary Insurance:   ID #:   Group #:  Pre-Cert / Auth required:  Pre-Cert / Auth #:   SCHEDULE INFORMATION: Procedure has been scheduled as follows:  Date :  09/18/2016             Time:  10:30 AM Location: [facility] Short Stay  This Gastroenterology Pre-Precedure Review Form is being routed to the following provider(s): Barney Drain, MD

## 2016-08-07 NOTE — Telephone Encounter (Signed)
MOVI PREP SPLIT DOSING, REGULAR BREAKFAST. CLEAR LIQUIDS AFTER 9 AM.  HOLD FLUID PILL ON DAY PRIOR TO AND DAY OF TCS.

## 2016-08-08 NOTE — Telephone Encounter (Signed)
Please address the diabetic meds.

## 2016-08-21 NOTE — Telephone Encounter (Signed)
CONTINUE GLUCOPHAGE.

## 2016-08-22 MED ORDER — PEG-KCL-NACL-NASULF-NA ASC-C 100 G PO SOLR: 1.0000 | ORAL | 0 refills | Status: DC

## 2016-08-22 NOTE — Telephone Encounter (Signed)
Rx sent to pharmacy and instructions mailed to pt.

## 2016-08-24 NOTE — Procedures (Signed)
[place] A. Merlene Laughter, MD     [email or web address]             NOCTURNAL POLYSOMNOGRAPHY   LOCATION: [facility]   Patient Name: Craig Knight, Craig Knight Date: 08/05/2016 Gender: Male D.O.B: June 03, 1965 Age (years): 5 Referring Provider: Marshell Garfinkel Height (inches): 67 Interpreting Physician: Phillips Odor MD, ABSM Weight (lbs): 221 RPSGT: Rosebud Poles BMI: 35 MRN: [id number] Neck Size: 18.50 CLINICAL INFORMATION Sleep Study Type: Split Night CPAP  Indication for sleep study: Excessive Daytime Sleepiness  Epworth Sleepiness Score: 12  SLEEP STUDY TECHNIQUE As per the AASM Manual for the Scoring of Sleep and Associated Events v2.3 (April 2016) with a hypopnea requiring 4% desaturations.  The channels recorded and monitored were frontal, central and occipital EEG, electrooculogram (EOG), submentalis EMG (chin), nasal and oral airflow, thoracic and abdominal wall motion, anterior tibialis EMG, snore microphone, electrocardiogram, and pulse oximetry. Continuous positive airway pressure (CPAP) was initiated when the patient met split night criteria and was titrated according to treat sleep-disordered breathing.  MEDICATIONS Medications self-administered by patient taken the night of the study : N/A  Current Outpatient Prescriptions:  .  albuterol (PROVENTIL HFA;VENTOLIN HFA) 108 (90 Base) MCG/ACT inhaler, Inhale 2 puffs into the lungs every 6 (six) hours as needed for wheezing or shortness of breath., Disp: 1 Inhaler, Rfl: 2 .  amLODipine (NORVASC) 10 MG tablet, Take 1 tablet (10 mg total) by mouth daily., Disp: 30 tablet, Rfl: 11 .  aspirin EC 81 MG tablet, Take 1 tablet (81 mg total) by mouth daily., Disp: , Rfl:  .  carvedilol (COREG) 6.25 MG tablet, Take 1 tablet (6.25 mg total) by mouth 2 (two) times daily with a meal., Disp: 60 tablet, Rfl: 5 .  chlorthalidone (HYGROTON) 25 MG tablet, Take 1 tablet (25 mg total) by mouth daily., Disp: 90 tablet,  Rfl: 2 .  Fluticasone-Umeclidin-Vilant (TRELEGY ELLIPTA) 100-62.5-25 MCG/INH AEPB, Inhale 1 puff into the lungs daily., Disp: 28 each, Rfl: 5 .  isosorbide mononitrate (IMDUR) 60 MG 24 hr tablet, Take 1 tablet (60 mg total) by mouth daily., Disp: 30 tablet, Rfl: 5 .  metFORMIN (GLUCOPHAGE) 500 MG tablet, Take 1 tablet (500 mg total) by mouth daily with breakfast., Disp: 30 tablet, Rfl: 5 .  naproxen (NAPROSYN) 500 MG tablet, Take 1 tablet by mouth twice daily with food as needed for pain, Disp: 36 tablet, Rfl: 2 .  nitroGLYCERIN (NITROSTAT) 0.4 MG SL tablet, Place 1 tablet (0.4 mg total) under the tongue every 5 (five) minutes as needed for chest pain. (Patient not taking: Reported on 08/06/2016), Disp: 30 tablet, Rfl: 0 .  peg 3350 powder (MOVIPREP) 100 g SOLR, Take 1 kit (200 g total) by mouth as directed., Disp: 1 kit, Rfl: 0 .  Tiotropium Bromide-Olodaterol (STIOLTO RESPIMAT) 2.5-2.5 MCG/ACT AERS, Inhale 2 puffs into the lungs daily., Disp: 1 Inhaler, Rfl: 5   RESPIRATORY PARAMETERS Diagnostic  Total AHI (/hr): 14.4 RDI (/hr): 14.4 OA Index (/hr): 3.2 CA Index (/hr): 0.0 REM AHI (/hr): 40.0 NREM AHI (/hr): 10.3 Supine AHI (/hr): 15.5 Non-supine AHI (/hr): 13.50 Min O2 Sat (%): 85.00 Mean O2 (%): 94.45 Time below 88% (min): 0.7     Titration: The patient was placed on positive pressures at the end of the study. Pressures were 5-6. Unfortunate, this resulted in worsening of apneic events and was therefore unsuccessful.  Optimal Pressure (cm):   AHI at Optimal Pressure (/hr): N/A Min O2 at Optimal Pressure (%): 87.00 Supine % at Optimal (%):  N/A Sleep % at Optimal (%): N/A     SLEEP ARCHITECTURE The recording time for the entire night was 349.6 minutes.  During a baseline period of 235.4 minutes, the patient slept for 149.5 minutes in REM and nonREM, yielding a sleep efficiency of 63.5%. Sleep onset after lights out was 48.8 minutes with a REM latency of 97.5 minutes. The patient spent 4.01%  of the night in stage N1 sleep, 46.49% in stage N2 sleep, 35.45% in stage N3 and 14.05% in REM.  During the titration period of 101.2 minutes, the patient slept for 22.0 minutes in REM and nonREM, yielding a sleep efficiency of 21.7%. Sleep onset after CPAP initiation was 10.8 minutes with a REM latency of N/A minutes. The patient spent 36.36% of the night in stage N1 sleep, 63.64% in stage N2 sleep, 0.00% in stage N3 and 0.00% in REM.  CARDIAC DATA The 2 lead EKG demonstrated sinus rhythm. The mean heart rate was N/A beats per minute. Other EKG findings include: PVCs. LEG MOVEMENT DATA The total Periodic Limb Movements of Sleep (PLMS) were 0. The PLMS index was 0.00.  IMPRESSIONS - Mild to moderate obstructive sleep apnea occurred during the diagnostic portion of the study (AHI = 14.4 /hour). CPAP titration was attempted but resulted in worsening apneic events. A formal CPAP/BiPAP titration study is recommended.  Delano Metz, MD Diplomate, American Board of Sleep Medicine.'  ELECTRONICALLY SIGNED ON:  08/24/2016, 9:51 PM [place] PH: [phone]   FX: [phone] [place]

## 2016-09-02 NOTE — Telephone Encounter (Signed)
No PA is needed for TCS 

## 2016-09-03 ENCOUNTER — Ambulatory Visit (INDEPENDENT_AMBULATORY_CARE_PROVIDER_SITE_OTHER): Payer: BLUE CROSS/BLUE SHIELD | Admitting: Family Medicine

## 2016-09-03 ENCOUNTER — Encounter: Payer: Self-pay | Admitting: Family Medicine

## 2016-09-03 ENCOUNTER — Telehealth: Payer: Self-pay | Admitting: *Deleted

## 2016-09-03 VITALS — BP 124/80 | HR 83 | Temp 98.1°F | Ht 67.0 in | Wt 225.1 lb

## 2016-09-03 DIAGNOSIS — I10 Essential (primary) hypertension: Secondary | ICD-10-CM

## 2016-09-03 DIAGNOSIS — R0600 Dyspnea, unspecified: Secondary | ICD-10-CM

## 2016-09-03 DIAGNOSIS — J441 Chronic obstructive pulmonary disease with (acute) exacerbation: Secondary | ICD-10-CM

## 2016-09-03 DIAGNOSIS — I5032 Chronic diastolic (congestive) heart failure: Secondary | ICD-10-CM

## 2016-09-03 MED ORDER — AZITHROMYCIN 250 MG PO TABS: ORAL_TABLET | ORAL | 0 refills | Status: DC

## 2016-09-03 MED ORDER — PREDNISONE 20 MG PO TABS: ORAL_TABLET | ORAL | 0 refills | Status: DC

## 2016-09-03 NOTE — Progress Notes (Signed)
   Subjective:    Patient ID: Craig Knight, male    DOB: 04/21/1965, 51 y.o.   MRN: [id number]  Shortness of Breath  This is a new problem. The current episode started more than 1 month ago. Associated symptoms include chest pain, headaches and wheezing. Exacerbated by: Walking. Associated symptoms comments: Cough . He has tried steroid inhalers for the symptoms.     Patient arrives office with shortness of breath. Recent flare of COPD. Cough productive at times. Increase wheeziness. Increase use of inhaler. Now rescue inhaler 4 times per day. Patient is yet on another preventative steroid/long-acting bronchodilator preparation. States its helping a bit but not as much as hoped.  No chest pain no headache.  Compliant with blood pressure medications.  Patient relates that he is now searching for disability status with his ongoing symptomatology        Review of Systems  Respiratory: Positive for shortness of breath and wheezing.   Cardiovascular: Positive for chest pain.  Neurological: Positive for headaches.       Objective:   Physical Exam Alert anxious appearing talking in full sentences vital stable HEENT normal lungs rare wheeze no tachypnea no crackles heart rare rhythm blood pressure good on repeat       Assessment & Plan:  Impression 1 exacerbation of COPD extremely long discussion held. Also complicated by other chronic factors such as hypertension with element of diastolic dysfunction and exercise intolerance from inactivity. Smoking discussed at length. Patient states working on down to what he claims is a couple cigarettes per day. Will prescribe prednisone taper and antibiotics for flare. Very long discussion held regarding the nature of patient's difficulties  Greater than 50% of this 25 minute face to face visit was spent in counseling and discussion and coordination of care regarding the above diagnosis/diagnosies

## 2016-09-03 NOTE — Telephone Encounter (Signed)
Left message return call 09/03/2016

## 2016-09-03 NOTE — Telephone Encounter (Signed)
Spoke with patient and offered him an appointment today. Patient verbalized understanding.

## 2016-09-03 NOTE — Telephone Encounter (Signed)
Patient called stating he is having trouble breathing on hot days, patient states cool days are better.Patient requested to speak with a nurse Please advise [phone]

## 2016-09-04 ENCOUNTER — Telehealth: Payer: Self-pay | Admitting: Pulmonary Disease

## 2016-09-04 NOTE — Telephone Encounter (Signed)
OK. Continue the therapy. No new recommendations  Marshell Garfinkel MD

## 2016-09-04 NOTE — Telephone Encounter (Signed)
Pt is aware of PM's recommendations. Pt states he forgot to mention during last convocation, as he was gasping for air that he seen his PCP yesterday. Pt states PCP prescribed zpak and pred taper. Pt started both medications this morning.  Will route to PM to make aware, and for further recommendations.

## 2016-09-04 NOTE — Telephone Encounter (Signed)
Call in pred taper. Starting at 40 mg. Reduce dose by 10 mg every 3 days.  Marshell Garfinkel MD

## 2016-09-04 NOTE — Telephone Encounter (Signed)
Spoke with pt, who states during his office visit with PM on 07/30/16 he was given a sample of Trelegy. Pt states he has nor noticed a difference in breathing with Trelegy. Pt reports of increased sob, wheezing, headache, sweats, chest tightness & prod cough (pt unsure color of mucus) x3wk. Pt using ventolin avg 5 times daily with no improvement.  Pt denies fever or chills.  PM please advise. Thanks.

## 2016-09-04 NOTE — Telephone Encounter (Signed)
Pt aware of PM recommendations and voiced his understanding. Pt would like to make PM aware that he applied for disability, just in case disability contacts PM for additional information. Nothing further needed.  Will route to PM as a FYI.

## 2016-09-09 DIAGNOSIS — Z029 Encounter for administrative examinations, unspecified: Secondary | ICD-10-CM

## 2016-09-16 MED ORDER — PEG 3350-KCL-NA BICARB-NACL 420 G PO SOLR: 4000.0000 mL | ORAL | 0 refills | Status: DC

## 2016-09-16 NOTE — Addendum Note (Signed)
Addended by: Everardo All on: 09/16/2016 02:47 PM   Modules accepted: Orders

## 2016-09-16 NOTE — Telephone Encounter (Signed)
Pt called. Movie prep too expensive. I have sent in [place] and faxed instructions to pharmacy and pt is aware.

## 2016-09-18 ENCOUNTER — Encounter (HOSPITAL_COMMUNITY)
Admission: RE | Disposition: A | Discharge status: Home / Self Care | Payer: Self-pay | Source: Ambulatory Visit | Attending: Gastroenterology

## 2016-09-18 ENCOUNTER — Ambulatory Visit (HOSPITAL_COMMUNITY)
Admission: RE | Admit: 2016-09-18 | Discharge: 2016-09-18 | Disposition: A | Discharge status: Home / Self Care | Payer: BLUE CROSS/BLUE SHIELD | Source: Ambulatory Visit | Attending: Gastroenterology | Admitting: Gastroenterology

## 2016-09-18 ENCOUNTER — Encounter (HOSPITAL_COMMUNITY): Payer: Self-pay | Admitting: *Deleted

## 2016-09-18 DIAGNOSIS — Z1211 Encounter for screening for malignant neoplasm of colon: Secondary | ICD-10-CM

## 2016-09-18 DIAGNOSIS — K644 Residual hemorrhoidal skin tags: Secondary | ICD-10-CM | POA: Insufficient documentation

## 2016-09-18 DIAGNOSIS — F17210 Nicotine dependence, cigarettes, uncomplicated: Secondary | ICD-10-CM | POA: Diagnosis not present

## 2016-09-18 DIAGNOSIS — Z7984 Long term (current) use of oral hypoglycemic drugs: Secondary | ICD-10-CM | POA: Insufficient documentation

## 2016-09-18 DIAGNOSIS — K5730 Diverticulosis of large intestine without perforation or abscess without bleeding: Secondary | ICD-10-CM | POA: Insufficient documentation

## 2016-09-18 DIAGNOSIS — J45909 Unspecified asthma, uncomplicated: Secondary | ICD-10-CM | POA: Insufficient documentation

## 2016-09-18 DIAGNOSIS — K648 Other hemorrhoids: Secondary | ICD-10-CM | POA: Insufficient documentation

## 2016-09-18 DIAGNOSIS — Z7982 Long term (current) use of aspirin: Secondary | ICD-10-CM | POA: Diagnosis not present

## 2016-09-18 DIAGNOSIS — D123 Benign neoplasm of transverse colon: Secondary | ICD-10-CM | POA: Diagnosis not present

## 2016-09-18 DIAGNOSIS — I10 Essential (primary) hypertension: Secondary | ICD-10-CM | POA: Insufficient documentation

## 2016-09-18 DIAGNOSIS — E119 Type 2 diabetes mellitus without complications: Secondary | ICD-10-CM | POA: Insufficient documentation

## 2016-09-18 DIAGNOSIS — Z1212 Encounter for screening for malignant neoplasm of rectum: Secondary | ICD-10-CM | POA: Diagnosis not present

## 2016-09-18 HISTORY — PX: COLONOSCOPY: SHX5424

## 2016-09-18 HISTORY — DX: Type 2 diabetes mellitus without complications: E11.9

## 2016-09-18 LAB — GLUCOSE, CAPILLARY: GLUCOSE-CAPILLARY: 104 mg/dL — AB (ref 65–99)

## 2016-09-18 SURGERY — COLONOSCOPY
Anesthesia: Moderate Sedation

## 2016-09-18 MED ORDER — METOPROLOL TARTRATE 5 MG/5ML IV SOLN
INTRAVENOUS | Status: DC | PRN
  Administered 2016-09-18: 5 mg via INTRAVENOUS

## 2016-09-18 MED ORDER — HYDRALAZINE HCL 20 MG/ML IJ SOLN
INTRAMUSCULAR | Status: AC
  Filled 2016-09-18: qty 1

## 2016-09-18 MED ORDER — MEPERIDINE HCL 100 MG/ML IJ SOLN
INTRAMUSCULAR | Status: DC | PRN
  Administered 2016-09-18: 50 mg via INTRAVENOUS
  Administered 2016-09-18: 25 mg via INTRAVENOUS

## 2016-09-18 MED ORDER — METOPROLOL TARTRATE 5 MG/5ML IV SOLN
INTRAVENOUS | Status: AC
  Filled 2016-09-18: qty 5

## 2016-09-18 MED ORDER — MEPERIDINE HCL 100 MG/ML IJ SOLN
INTRAMUSCULAR | Status: AC
  Filled 2016-09-18: qty 2

## 2016-09-18 MED ORDER — MIDAZOLAM HCL 5 MG/5ML IJ SOLN
INTRAMUSCULAR | Status: AC
  Filled 2016-09-18: qty 10

## 2016-09-18 MED ORDER — MIDAZOLAM HCL 5 MG/5ML IJ SOLN
INTRAMUSCULAR | Status: DC | PRN
  Administered 2016-09-18 (×2): 2 mg via INTRAVENOUS
  Administered 2016-09-18: 1 mg via INTRAVENOUS

## 2016-09-18 MED ORDER — SODIUM CHLORIDE 0.9 % IV SOLN
INTRAVENOUS | Status: DC
  Administered 2016-09-18: 1000 mL via INTRAVENOUS

## 2016-09-18 MED ORDER — HYDRALAZINE HCL 20 MG/ML IJ SOLN
INTRAMUSCULAR | Status: DC | PRN
  Administered 2016-09-18 (×2): 10 mg via INTRAVENOUS

## 2016-09-18 NOTE — Discharge Instructions (Signed)
You have small internal AND LARGE EXTERNAL HEMORRHOIDS and diverticulosis IN YOUR LEFT AND RIGHT COLON. YOU HAD ONE POLYP REMOVED.   DRINK WATER TO KEEP YOUR URINE LIGHT YELLOW.  FOLLOW A HIGH FIBER DIET. AVOID ITEMS THAT CAUSE BLOATING. See info below.YOU SHOULD TRANSITION TO A PLANT BASED DIET-NO MEAT OR DAIRY for 6 MOS. I RECOMMEND THE BOOK, "PREVENT AND REVERSE HEART DISEASE", CALDWELL ESSELSTYN JR., MD. PAGES 120-121 [place] THE DIET AND THE LAST HALF OF THE BOOK HAS QUICK AND EASY RECIPES FOR BREAKFAST, LUNCH, AND DINNER ARE AFTER PAGE 127.   CONTINUE YOUR WEIGHT LOSS EFFORTS.  WHILE I DO NOT WANT TO ALARM YOU, YOUR BODY MASS INDEX (BMI) IS OVER 30 WHICH MEANS YOU ARE OBESE. OBESITY ACTIVATES CANCER GENES. OBESITY IS ASSOCIATED WITH AN INCREASE RISK FOR ALL CANCERS, INCLUDING ESOPHAGEAL AND COLON CANCER. A WEIGHT OF   WILL GET YOUR BMI UNDER 30.  YOUR BIOPSY RESULTS WILL BE AVAILABLE IN MY CHART AFTER    AND MY OFFICE WILL CONTACT YOU IN 10-14 DAYS WITH YOUR RESULTS.   Next colonoscopy in 3-5 years.  Colonoscopy Care After Read the instructions outlined below and refer to this sheet in the next week. These discharge instructions provide you with general information on caring for yourself after you leave the hospital. While your treatment has been planned according to the most current medical practices available, unavoidable complications occasionally occur. If you have any problems or questions after discharge, call DR. Jamaul Heist, [phone].  ACTIVITY  You may resume your regular activity, but move at a slower pace for the next 24 hours.   Take frequent rest periods for the next 24 hours.   Walking will help get rid of the air and reduce the bloated feeling in your belly (abdomen).   No driving for 24 hours (because of the medicine (anesthesia) used during the test).   You may shower.   Do not sign any important legal documents or operate any machinery for 24  hours (because of the anesthesia used during the test).    NUTRITION  Drink plenty of fluids.   You may resume your normal diet as instructed by your doctor.   Begin with a light meal and progress to your normal diet. Heavy or fried foods are harder to digest and may make you feel sick to your stomach (nauseated).   Avoid alcoholic beverages for 24 hours or as instructed.    MEDICATIONS  You may resume your normal medications.   WHAT YOU CAN EXPECT TODAY  Some feelings of bloating in the abdomen.   Passage of more gas than usual.   Spotting of blood in your stool or on the toilet paper  .  IF YOU HAD POLYPS REMOVED DURING THE COLONOSCOPY:  Eat a soft diet IF YOU HAVE NAUSEA, BLOATING, ABDOMINAL PAIN, OR VOMITING.    FINDING OUT THE RESULTS OF YOUR TEST Not all test results are available during your visit. DR. Oneida Alar WILL CALL YOU WITHIN 14 DAYS OF YOUR PROCEDUE WITH YOUR RESULTS. Do not assume everything is normal if you have not heard from DR. Adrea Sherpa, CALL HER OFFICE AT [phone].  SEEK IMMEDIATE MEDICAL ATTENTION AND CALL THE OFFICE: [phone] IF:  You have more than a spotting of blood in your stool.   Your belly is swollen (abdominal distention).   You are nauseated or vomiting.   You have a temperature over 101F.   You have abdominal pain or discomfort that is severe  or gets worse throughout the day.  High-Fiber Diet A high-fiber diet changes your normal diet to include more whole grains, legumes, fruits, and vegetables. Changes in the diet involve replacing refined carbohydrates with unrefined foods. The calorie level of the diet is essentially unchanged. The Dietary Reference Intake (recommended amount) for adult males is 38 grams per day. For adult females, it is 25 grams per day. Pregnant and lactating women should consume 28 grams of fiber per day. Fiber is the intact part of a plant that is not broken down during digestion. Functional fiber is fiber  that has been isolated from the plant to provide a beneficial effect in the body. PURPOSE  Increase stool bulk.   Ease and regulate bowel movements.   Lower cholesterol.   REDUCE RISK OF COLON CANCER  INDICATIONS THAT YOU NEED MORE FIBER  Constipation and hemorrhoids.   Uncomplicated diverticulosis (intestine condition) and irritable bowel syndrome.   Weight management.   As a protective measure against hardening of the arteries (atherosclerosis), diabetes, and cancer.   GUIDELINES FOR INCREASING FIBER IN THE DIET  Start adding fiber to the diet slowly. A gradual increase of about 5 more grams (2 slices of whole-wheat bread, 2 servings of most fruits or vegetables, or 1 bowl of high-fiber cereal) per day is best. Too rapid an increase in fiber may result in constipation, flatulence, and bloating.   Drink enough water and fluids to keep your urine clear or pale yellow. Water, juice, or caffeine-free drinks are recommended. Not drinking enough fluid may cause constipation.   Eat a variety of high-fiber foods rather than one type of fiber.   Try to increase your intake of fiber through using high-fiber foods rather than fiber pills or supplements that contain small amounts of fiber.   The goal is to change the types of food eaten. Do not supplement your present diet with high-fiber foods, but replace foods in your present diet.   INCLUDE A VARIETY OF FIBER SOURCES  Replace refined and processed grains with whole grains, canned fruits with fresh fruits, and incorporate other fiber sources. White rice, white breads, and most bakery goods contain little or no fiber.   Brown whole-grain rice, buckwheat oats, and many fruits and vegetables are all good sources of fiber. These include: broccoli, Brussels sprouts, cabbage, cauliflower, beets, sweet potatoes, white potatoes (skin on), carrots, tomatoes, eggplant, squash, berries, fresh fruits, and dried fruits.   Cereals appear to be the  richest source of fiber. Cereal fiber is found in whole grains and bran. Bran is the fiber-rich outer coat of cereal grain, which is largely removed in refining. In whole-grain cereals, the bran remains. In breakfast cereals, the largest amount of fiber is found in those with "bran" in their names. The fiber content is sometimes indicated on the label.   You may need to include additional fruits and vegetables each day.   In baking, for 1 cup white flour, you may use the following substitutions:   1 cup whole-wheat flour minus 2 tablespoons.   1/2 cup white flour plus 1/2 cup whole-wheat flour.   Polyps, Colon  A polyp is extra tissue that grows inside your body. Colon polyps grow in the large intestine. The large intestine, also called the colon, is part of your digestive system. It is a long, hollow tube at the end of your digestive tract where your body makes and stores stool. Most polyps are not dangerous. They are benign. This means they are not  cancerous. But over time, some types of polyps can turn into cancer. Polyps that are smaller than a pea are usually not harmful. But larger polyps could someday become or may already be cancerous. To be safe, doctors remove all polyps and test them.   WHO GETS POLYPS? Anyone can get polyps, but certain people are more likely than others. You may have a greater chance of getting polyps if:  You are over 50.   You have had polyps before.   Someone in your family has had polyps.   Someone in your family has had cancer of the large intestine.   Find out if someone in your family has had polyps. You may also be more likely to get polyps if you:   Eat a lot of fatty foods   Smoke   Drink alcohol   Do not exercise  Eat too much     PREVENTION There is not one sure way to prevent polyps. You might be able to lower your risk of getting them if you:  Eat more fruits and vegetables and less fatty food.   Do not smoke.   Avoid alcohol.    Exercise every day.   Lose weight if you are overweight.   Eating more calcium and folate can also lower your risk of getting polyps. Some foods that are rich in calcium are milk, cheese, and broccoli. Some foods that are rich in folate are chickpeas, kidney beans, and spinach.    Diverticulosis Diverticulosis is a common condition that develops when small pouches (diverticula) form in the wall of the colon. The risk of diverticulosis increases with age. It happens more often in people who eat a low-fiber diet. Most individuals with diverticulosis have no symptoms. Those individuals with symptoms usually experience belly (abdominal) pain, constipation, or loose stools (diarrhea).  HOME CARE INSTRUCTIONS  Increase the amount of fiber in your diet as directed by your caregiver or dietician. This may reduce symptoms of diverticulosis.   Drink at least 6 to 8 glasses of water each day to prevent constipation.   Try not to strain when you have a bowel movement.   Avoiding nuts and seeds to prevent complications is NOT NECESSARY.   FOODS HAVING HIGH FIBER CONTENT INCLUDE:  Fruits. Apple, peach, pear, tangerine, raisins, prunes.   Vegetables. Brussels sprouts, asparagus, broccoli, cabbage, carrot, cauliflower, romaine lettuce, spinach, summer squash, tomato, winter squash, zucchini.   Starchy Vegetables. Baked beans, kidney beans, lima beans, split peas, lentils, potatoes (with skin).   Grains. Whole wheat bread, brown rice, bran flake cereal, plain oatmeal, white rice, shredded wheat, bran muffins.    SEEK IMMEDIATE MEDICAL CARE IF:  You develop increasing pain or severe bloating.   You have an oral temperature above 101F.   You develop vomiting or bowel movements that are bloody or black.   Hemorrhoids Hemorrhoids are dilated (enlarged) veins around the rectum. Sometimes clots will form in the veins. This makes them swollen and painful. These are called thrombosed  hemorrhoids. Causes of hemorrhoids include:  Constipation.   Straining to have a bowel movement.   HEAVY LIFTING  HOME CARE INSTRUCTIONS  Eat a well balanced diet and drink 6 to 8 glasses of water every day to avoid constipation. You may also use a bulk laxative.   Avoid straining to have bowel movements.   Keep anal area dry and clean.   Do not use a donut shaped pillow or sit on the toilet for long periods. This increases  blood pooling and pain.   Move your bowels when your body has the urge; this will require less straining and will decrease pain and pressure.

## 2016-09-18 NOTE — H&P (Signed)
Primary Care Physician:  Mikey Kirschner, MD Primary Gastroenterologist:  Dr. Oneida Alar  Pre-Procedure History & Physical: HPI:  Craig Knight is a 51 y.o. male here for COLON CANCER SCREENING.  Past Medical History:  Diagnosis Date  . Asthma   . Bronchitis   . Diabetes mellitus without complication ([place])   . Hypertension     Past Surgical History:  Procedure Laterality Date  . lipoma removal      Prior to Admission medications   Medication Sig Start Date End Date Taking? Authorizing Provider  amLODipine (NORVASC) 10 MG tablet Take 1 tablet (10 mg total) by mouth daily. 03/05/16  Yes Lendon Colonel, NP  aspirin EC 81 MG tablet Take 1 tablet (81 mg total) by mouth daily. 10/13/13  Yes Branch, Alphonse Guild, MD  chlorthalidone (HYGROTON) 25 MG tablet Take 1 tablet (25 mg total) by mouth daily. 03/05/16  Yes Lendon Colonel, NP  Fluticasone-Umeclidin-Vilant (TRELEGY ELLIPTA) 100-62.5-25 MCG/INH AEPB Inhale 1 puff into the lungs daily. 07/30/16  Yes Mannam, Praveen, MD  hydrALAZINE (APRESOLINE) 25 MG tablet Take 25 mg by mouth daily.   Yes [provider]  isosorbide mononitrate (IMDUR) 60 MG 24 hr tablet Take 1 tablet (60 mg total) by mouth daily. 07/03/16  Yes Mikey Kirschner, MD  losartan (COZAAR) 25 MG tablet Take 25 mg by mouth daily.   Yes [provider]  metFORMIN (GLUCOPHAGE) 500 MG tablet Take 1 tablet (500 mg total) by mouth daily with breakfast. 07/03/16  Yes Mikey Kirschner, MD  polyethylene glycol-electrolytes (TRILYTE) 420 g solution Take 4,000 mLs by mouth as directed. 09/16/16  Yes Fields, Sandi L, MD  Tiotropium Bromide-Olodaterol (STIOLTO RESPIMAT) 2.5-2.5 MCG/ACT AERS Inhale 2 puffs into the lungs daily. 05/29/16  Yes Mannam, Praveen, MD  albuterol (PROVENTIL HFA;VENTOLIN HFA) 108 (90 Base) MCG/ACT inhaler Inhale 2 puffs into the lungs every 6 (six) hours as needed for wheezing or shortness of breath. 07/30/16   Mannam, Hart Robinsons, MD  azithromycin  (ZITHROMAX) 250 MG tablet Take as directed. Patient not taking: Reported on 09/16/2016 09/03/16   Mikey Kirschner, MD  carvedilol (COREG) 6.25 MG tablet Take 1 tablet (6.25 mg total) by mouth 2 (two) times daily with a meal. Patient not taking: Reported on 09/16/2016 07/03/16   Mikey Kirschner, MD  naproxen (NAPROSYN) 500 MG tablet Take 1 tablet by mouth twice daily with food as needed for pain Patient not taking: Reported on 09/03/2016 07/03/16   Mikey Kirschner, MD  nitroGLYCERIN (NITROSTAT) 0.4 MG SL tablet Place 1 tablet (0.4 mg total) under the tongue every 5 (five) minutes as needed for chest pain. 02/28/16   Mikey Kirschner, MD  predniSONE (DELTASONE) 20 MG tablet Take 3 tablet by mouth for for three days, then take 2 tablets a day for three days, then take 1 tablet for two days. Patient not taking: Reported on 09/16/2016 09/03/16   Mikey Kirschner, MD    Allergies as of 08/07/2016 - Review Complete 08/06/2016  Allergen Reaction Noted  . Dust mite extract  10/13/2013  . Lisinopril Cough 06/07/2015    Family History  Problem Relation Age of Onset  . Stroke Mother   . Heart attack Father 61       CABG  . Hypertension Sister   . Stroke Brother   . Hypertension Brother   . Stomach cancer Brother     Social History   Social History  . Marital status: Married  Spouse name: N/A  . Number of children: N/A  . Years of education: N/A   Occupational History  . Not on file.   Social History Main Topics  . Smoking status: Current Some Day Smoker    Packs/day: 0.00    Years: 25.00    Types: Cigarettes  . Smokeless tobacco: Former Systems developer    Quit date: 07/11/2015     Comment: a pack will last a month-05/29/2016  . Alcohol use 0.0 oz/week     Comment: occasionally  . Drug use: No  . Sexual activity: Yes    Birth control/ protection: None   Other Topics Concern  . Not on file   Social History Narrative  . No narrative on file    Review of Systems: See HPI, otherwise  negative ROS   Physical Exam: BP (!) 143/106   Pulse 66   Temp 97.8 F (36.6 C) (Oral)   Resp 18   Ht 5\' 7"  (1.702 m)   Wt 220 lb (99.8 kg)   SpO2 97%   BMI 34.46 kg/m  General:   Alert,  pleasant and cooperative in NAD Head:  Normocephalic and atraumatic. Neck:  Supple; Lungs:  Clear throughout to auscultation.    Heart:  Regular rate and rhythm. Abdomen:  Soft, nontender and nondistended. Normal bowel sounds, without guarding, and without rebound.   Neurologic:  Alert and  oriented x4;  grossly normal neurologically.  Impression/Plan:     SCREENING  Plan:  1. TCS TODAY. DISCUSSED PROCEDURE, BENEFITS, & RISKS: < 1% chance of medication reaction, bleeding, perforation, or rupture of spleen/liver.

## 2016-09-18 NOTE — Op Note (Signed)
[facility] Patient Name: Craig Knight Procedure Date: 09/18/2016 10:36 AM MRN: [id number] Date of Birth: 02-Jan-1966 Attending MD: Barney Drain , MD CSN: [id number] Age: 51 Admit Type: Outpatient Procedure:                Colonoscopy WITH SNARE POLYPECTOMY Indications:              Screening for colorectal malignant neoplasm Providers:                Barney Drain, MD, Lurline Del, RN, Randa Spike,                            Technician Referring MD:             Rosemary Holms, MD Medicines:                Meperidine 75 mg IV, Midazolam 5 mg IV; HYRDALAZINE                            20 MG IV Complications:            No immediate complications. PT'S DBP > 105 DURING                            TCS. Estimated Blood Loss:     Estimated blood loss: none. Procedure:                Pre-Anesthesia Assessment:                           - Prior to the procedure, a History and Physical                            was performed, and patient medications and                            allergies were reviewed. The patient's tolerance of                            previous anesthesia was also reviewed. The risks                            and benefits of the procedure and the sedation                            options and risks were discussed with the patient.                            All questions were answered, and informed consent                            was obtained. Prior Anticoagulants: The patient has                            taken aspirin, last dose was 1 day prior to  procedure. ASA Grade Assessment: II - A patient                            with mild systemic disease. After reviewing the                            risks and benefits, the patient was deemed in                            satisfactory condition to undergo the procedure.                            After obtaining informed consent, the colonoscope                            was passed  under direct vision. Throughout the                            procedure, the patient's blood pressure, pulse, and                            oxygen saturations were monitored continuously. The                            EC-[id number] ([id number]) scope was introduced through                            the anus and advanced to the the cecum, identified                            by appendiceal orifice and ileocecal valve. The                            colonoscopy was somewhat difficult due to a                            tortuous colon. Successful completion of the                            procedure was aided by COLOWRAP. The patient                            tolerated the procedure well. The quality of the                            bowel preparation was good. The ileocecal valve,                            appendiceal orifice, and rectum were photographed. Scope In: 10:55:25 AM Scope Out: 11:11:46 AM Scope Withdrawal Time: 0 hours 14 minutes 38 seconds  Total Procedure Duration: 0 hours 16 minutes 21 seconds  Findings:      A 6 mm polyp was found in the hepatic flexure. The polyp was sessile.  The polyp was removed with a hot snare. Resection and retrieval were       complete.      Multiple small and large-mouthed diverticula were found in the       recto-sigmoid colon, sigmoid colon, descending colon and ascending colon.      The recto-sigmoid colon and sigmoid colon were mildly redundant.      External and internal hemorrhoids were found during retroflexion. Impression:               - One 6 mm polyp at the hepatic flexure, removed                            with a hot snare. Resected and retrieved.                           - Diverticulosis in the recto-sigmoid colon, in the                            sigmoid colon, in the descending colon and in the                            ascending colon.                           - Redundant LEFT colon.                           - External  and internal hemorrhoids. Moderate Sedation:      Moderate (conscious) sedation was administered by the endoscopy nurse       and supervised by the endoscopist. The following parameters were       monitored: oxygen saturation, heart rate, blood pressure, and response       to care. Total physician intraservice time was 27 minutes. Recommendation:           - Repeat colonoscopy in 5-10 years for surveillance.                           - High fiber diet.                           - Continue present medications.                           - Await pathology results.                           - Patient has a contact number available for                            emergencies. The signs and symptoms of potential                            delayed complications were discussed with the                            patient. Return to normal activities tomorrow.  Written discharge instructions were provided to the                            patient. Procedure Code(s):        --- Professional ---                           [phone], Colonoscopy, flexible; with removal of                            tumor(s), polyp(s), or other lesion(s) by snare                            technique                           99152, Moderate sedation services provided by the                            same physician or other qualified health care                            professional performing the diagnostic or                            therapeutic service that the sedation supports,                            requiring the presence of an independent trained                            observer to assist in the monitoring of the                            patient's level of consciousness and physiological                            status; initial 15 minutes of intraservice time,                            patient age 9 years or older                           [phone], Moderate sedation services;  each additional                            15 minutes intraservice time Diagnosis Code(s):        --- Professional ---                           Z12.11, Encounter for screening for malignant                            neoplasm of colon  D12.3, Benign neoplasm of transverse colon (hepatic                            flexure or splenic flexure)                           K64.8, Other hemorrhoids                           K57.30, Diverticulosis of large intestine without                            perforation or abscess without bleeding                           Q43.8, Other specified congenital malformations of                            intestine CPT copyright 2016 [employer]. All rights reserved. The codes documented in this report are preliminary and upon coder review may  be revised to meet current compliance requirements. Barney Drain, MD Barney Drain, MD 09/18/2016 11:20:56 AM This report has been signed electronically. Number of Addenda: 0

## 2016-09-19 ENCOUNTER — Telehealth: Payer: Self-pay | Admitting: Gastroenterology

## 2016-09-19 NOTE — Telephone Encounter (Signed)
Pt is aware.  

## 2016-09-19 NOTE — Telephone Encounter (Signed)
Please call pt. He had ONE  simple adenomas removed from hIS colon. NEXT TCS in 5-10 years. FOLLOW A High fiber diet.

## 2016-09-20 ENCOUNTER — Encounter (HOSPITAL_COMMUNITY): Payer: Self-pay | Admitting: Gastroenterology

## 2016-09-20 NOTE — Telephone Encounter (Signed)
Reminder in epic °

## 2016-10-10 ENCOUNTER — Telehealth: Payer: Self-pay | Admitting: Pulmonary Disease

## 2016-10-10 ENCOUNTER — Encounter: Payer: Self-pay | Admitting: Family Medicine

## 2016-10-10 ENCOUNTER — Ambulatory Visit (INDEPENDENT_AMBULATORY_CARE_PROVIDER_SITE_OTHER): Payer: BLUE CROSS/BLUE SHIELD | Admitting: Family Medicine

## 2016-10-10 VITALS — BP 130/90 | Ht 67.0 in | Wt 222.1 lb

## 2016-10-10 DIAGNOSIS — G4733 Obstructive sleep apnea (adult) (pediatric): Secondary | ICD-10-CM

## 2016-10-10 DIAGNOSIS — N183 Chronic kidney disease, stage 3 unspecified: Secondary | ICD-10-CM

## 2016-10-10 DIAGNOSIS — I5032 Chronic diastolic (congestive) heart failure: Secondary | ICD-10-CM

## 2016-10-10 DIAGNOSIS — F321 Major depressive disorder, single episode, moderate: Secondary | ICD-10-CM | POA: Diagnosis not present

## 2016-10-10 DIAGNOSIS — E1122 Type 2 diabetes mellitus with diabetic chronic kidney disease: Secondary | ICD-10-CM

## 2016-10-10 DIAGNOSIS — N289 Disorder of kidney and ureter, unspecified: Secondary | ICD-10-CM | POA: Diagnosis not present

## 2016-10-10 LAB — POCT GLYCOSYLATED HEMOGLOBIN (HGB A1C): Hemoglobin A1C: 6.2

## 2016-10-10 MED ORDER — BUPROPION HCL ER (SR) 150 MG PO TB12: ORAL_TABLET | ORAL | 3 refills | Status: DC

## 2016-10-10 NOTE — Progress Notes (Signed)
   Subjective:    Patient ID: Craig Knight, male    DOB: [birth year], 51 y.o.   MRN: [id number]  Diabetes  He presents for his follow-up diabetic visit. He has type 2 diabetes mellitus. He has not had a previous visit with a dietitian. He does not see a podiatrist.Eye exam is not current.   Patient also has concerns of COPD. Patient states that breathing is worsening and he has a hard time breathing on exertion.   Blood pressure medicine and blood pressure levels reviewed today with patient. Compliant with blood pressure medicine. States does not miss a dose. No obvious side effects. Blood pressure generally good when checked elsewhere. Watching salt intake.  Patient claims compliance with diabetes medication. No obvious side effects. Reports no substantial low sugar spells. Most numbers are generally in good range when checked fasting. Generally does not miss a dose of medication. Watching diabetic diet closely    Chest pain, feels like someone hit him, lasts fewminuteSometimes sharp. Sometimes worse with of breath. Can occur at rest or exertion. Patient had intermediate risk stress test. 03/25/2016 Also has concerns of constant itching.  Results for orders placed or performed in visit on 10/10/16  POCT HgB A1C  Result Value Ref Range   Hemoglobin A1C 6.2      Review of Systems No headache, no major weight loss or weight gain, no chest pain no back pain abdominal pain no change in bowel habits complete ROS otherwise negative     Objective:   Physical Exam Alert and oriented, vitals reviewed and stable, NAD ENT-TM's and ext canals WNL bilat via otoscopic exam Soft palate, tonsils and post pharynx WNL via oropharyngeal exam Neck-symmetric, no masses; thyroid nonpalpable and nontender Pulmonary-no tachypnea or accessory muscle use; Clear without wheezes via auscultation Card--no abnrml murmurs, rhythm reg and rate WNL Carotid pulses symmetric, without bruits  Ankles no  significant edema      Assessment & Plan:  Impression 1 exertional dyspnea likely secondary to see a severe COPD, also complicated by elements of diastolic dysfunction due to long-standing hypertension #2 hypertension decent control discussed #3 type 2 diabetes decent control discussed maintain same meds #4 depression discussed. No suicidal thoughts. Does feel down at times. Has some "what's the use feeligs at times also". Plan maintain same meds for blood pressure. Maintain same meds for diabetes. Initiate antidepressant rationale discussed. Diet exercise discussed. Encouraged to discuss breathing with pulmonary specialist. Follow-up as scheduled. Recommend mental health counseling

## 2016-10-10 NOTE — Telephone Encounter (Signed)
Sleep study shows OSA. He will need CPAP/BiPAP titration study. Please order and inform the pt. Thanks  Marshell Garfinkel MD [facility] 10/10/2016, 10:33 PM

## 2016-10-11 NOTE — Telephone Encounter (Signed)
Pt is aware of results and voiced his understanding. CPAP/BIPAP titration has been ordered.  Nothing further needed.

## 2016-10-14 DIAGNOSIS — F321 Major depressive disorder, single episode, moderate: Secondary | ICD-10-CM | POA: Insufficient documentation

## 2016-10-18 ENCOUNTER — Encounter: Payer: Self-pay | Admitting: Family Medicine

## 2016-10-30 ENCOUNTER — Encounter: Payer: Self-pay | Admitting: Pulmonary Disease

## 2016-10-30 ENCOUNTER — Ambulatory Visit (INDEPENDENT_AMBULATORY_CARE_PROVIDER_SITE_OTHER): Payer: BLUE CROSS/BLUE SHIELD | Admitting: Pulmonary Disease

## 2016-10-30 VITALS — BP 142/82 | HR 75 | Ht 68.0 in | Wt 225.6 lb

## 2016-10-30 DIAGNOSIS — G4733 Obstructive sleep apnea (adult) (pediatric): Secondary | ICD-10-CM | POA: Diagnosis not present

## 2016-10-30 DIAGNOSIS — R0602 Shortness of breath: Secondary | ICD-10-CM

## 2016-10-30 DIAGNOSIS — J449 Chronic obstructive pulmonary disease, unspecified: Secondary | ICD-10-CM | POA: Diagnosis not present

## 2016-10-30 NOTE — Progress Notes (Addendum)
Craig Knight    [id number]    08-Oct-1965  Primary Care Physician:Luking, Grace Bushy, MD  Referring Physician: Mikey Kirschner, [address] [place], [place] [address]  Chief complaint:  Follow up for  COPD GOLD B (CAT score 29, 0 exacerbations)  HPI: [name] is 51 year old with past medical history of childhood asthma, hypertension, active smoker. He was diagnosed with asthma as a child which got better in adulthood. 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 He reports mainly dyspnea on exertion and does not have dyspnea on rest, wheezing. He has daily symptoms of cough with white sputum production. He is to have an albuterol rescue inhaler but is not sure if it helped with the symptoms. He is currently not on any inhalers. He does not have seasonal allergies, sensitivities to cats, dogs, strong perfumes, no heartburn symptoms  He has over 38-pack-year smoking history and is trying to quit. He is down to 1 pack per week now. 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  Interim history: Feels that his dyspnea has worsened since last visit. He was started on trelegy inhaler but has not noticed any difference. He continues to smoke but is trying to quit. He has a CPAP/BiPAP titration pending to determine the settings he needs to be on  Outpatient Encounter Prescriptions as of 10/30/2016  Medication Sig  . albuterol (PROVENTIL HFA;VENTOLIN HFA) 108 (90 Base) MCG/ACT inhaler Inhale 2 puffs into the lungs every 6 (six) hours as needed for wheezing or shortness of breath.  Marland Kitchen amLODipine (NORVASC) 10 MG tablet Take 1 tablet (10 mg total) by mouth daily.  Marland Kitchen aspirin EC 81 MG tablet Take 1 tablet (81 mg total) by mouth daily.  Marland Kitchen buPROPion (WELLBUTRIN SR) 150 MG 12 hr tablet Take 1 tablet by mouth every morning for 3 days, then take 1 tablet by mouth  twice daily  . chlorthalidone (HYGROTON) 25 MG tablet Take 1 tablet (25 mg total) by mouth daily.  . Fluticasone-Umeclidin-Vilant (TRELEGY ELLIPTA) 100-62.5-25 MCG/INH AEPB Inhale 1 puff into the lungs daily.  . hydrALAZINE (APRESOLINE) 25 MG tablet Take 25 mg by mouth daily.  . isosorbide mononitrate (IMDUR) 60 MG 24 hr tablet Take 1 tablet (60 mg total) by mouth daily.  Marland Kitchen losartan (COZAAR) 25 MG tablet Take 25 mg by mouth daily.  . metFORMIN (GLUCOPHAGE) 500 MG tablet Take 1 tablet (500 mg total) by mouth daily with breakfast.  . nitroGLYCERIN (NITROSTAT) 0.4 MG SL tablet Place 1 tablet (0.4 mg total) under the tongue every 5 (five) minutes as needed for chest pain.  . Tiotropium Bromide-Olodaterol (STIOLTO RESPIMAT) 2.5-2.5 MCG/ACT AERS Inhale 2 puffs into the lungs daily. (Patient not taking: Reported on 10/30/2016)   No facility-administered encounter medications on file as of 10/30/2016.     Allergies as of 10/30/2016 - Review Complete 10/30/2016  Allergen Reaction Noted  . Dust mite extract  10/13/2013  . Lisinopril Cough 06/07/2015    Past Medical History:  Diagnosis Date  . Asthma   . Bronchitis   . Diabetes mellitus without complication ([place])   . Hypertension     Past Surgical History:  Procedure Laterality Date  . COLONOSCOPY N/A 09/18/2016   Procedure: COLONOSCOPY;  Surgeon: Danie Binder, MD;  [facility];  Service:  Endoscopy;  Laterality: N/A;  10:30 AM  . lipoma removal      Family History  Problem Relation Age of Onset  . Stroke Mother   . Heart attack Father 19       CABG  . Hypertension Sister   . Stroke Brother   . Hypertension Brother   . Stomach cancer Brother     Social History   Social History  . Marital status: Married    Spouse name: N/A  . Number of children: N/A  . Years of education: N/A   Occupational History  . Not on file.   Social History Main Topics  . Smoking status: Current Some Day Smoker    Packs/day: 0.25     Years: 25.00    Types: Cigarettes  . Smokeless tobacco: Former Systems developer    Quit date: 07/11/2015     Comment: a pack will last a month-05/29/2016  . Alcohol use 0.0 oz/week     Comment: occasionally  . Drug use: No  . Sexual activity: Yes    Birth control/ protection: None   Other Topics Concern  . Not on file   Social History Narrative  . No narrative on file    Review of systems: Review of Systems  Constitutional: Negative for fever and chills.  HENT: Negative.   Eyes: Negative for blurred vision.  Respiratory: as per HPI  Cardiovascular: Negative for chest pain and palpitations.  Gastrointestinal: Negative for vomiting, diarrhea, blood per rectum. Genitourinary: Negative for dysuria, urgency, frequency and hematuria.  Musculoskeletal: Negative for myalgias, back pain and joint pain.  Skin: Negative for itching and rash.  Neurological: Negative for dizziness, tremors, focal weakness, seizures and loss of consciousness.  Endo/Heme/Allergies: Negative for environmental allergies.  Psychiatric/Behavioral: Negative for depression, suicidal ideas and hallucinations.  All other systems reviewed and are negative.  Physical Exam: Blood pressure (!) 142/82, pulse 75, height 5\' 8"  (1.727 m), weight 225 lb 9.6 oz (102.3 kg), SpO2 99 %. Gen:      No acute distress HEENT:  EOMI, sclera anicteric Neck:     No masses; no thyromegaly Lungs:    Clear to auscultation bilaterally; normal respiratory effort CV:         Regular rate and rhythm; no murmurs Abd:      + bowel sounds; soft, non-tender; no palpable masses, no distension Ext:    No edema; adequate peripheral perfusion Skin:      Warm and dry; no rash Neuro: alert and oriented x 3 Psych: normal mood and affect  Data Reviewed: FENO 05/29/16- 12 FENO 07/30/16- 9  PFTs 04/24/16 FVC 2.01 (62%), FEV1 1.65 (53%), F/F 69, TLC 75%, RV/TLC 130% DLCO 65%, DLCO/VA 102% Moderate to severe obstructive airway disease with no bronchodilator  response. Moderate diffusion impairment which corrects for alveolar volume Mild reduction in total lung capacity, airtrapping.  Chest x-ray 02/16/16-no acute cardio pulmonary abnormality. Chest x-ray 06/04/16- cardio megaly, left base atelectasis CT abdomen 06/04/16- mild left base atelectasis with scarring. No acute infiltrate or fibrosis I reviewed all images personally.  CBC with diff 06/04/16- WBC 13.6. Eos 2%, absolute eos count 272 Blood allergy profile- IgE 133, sensitive to weed, grass and elm.   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 Grade 1   diastolic dysfunction. Mild left atrial enlargement. Trivial   tricuspid regurgitation.  Sleep study 12/23/13 Mild sleep apnea.  Sleep study 08/05/16 Mild to moderate obstructive sleep apnea occurred  during the diagnostic portion of the study (AHI = 14.4 /hour). CPAP titration was attempted but resulted in worsening apneic events. A formal CPAP/BiPAP titration study is recommended.  Assessment:  COPD GOLD B Review of his PFTs show obstruction with no bronchodilator response. In addition he has air trapping consistent with emphysema. There is no bronchodilator response, FENO is low and his clinical picture is not consistent with asthma. There is mild restriction with marked reduction in ERV secondary to body habitus and obesity. There is no suspicion for interstitial lung disease on his chest imaging  Started on telegy but still continues to be dyspneic. Dyspnea seems to be out of proportion to the severity of the obstruction. I suspect his symptoms are multifactorial from untreated OSA, obesity, deconditioning, severe LVH with diastolic dysfunction, chronic kidney disease in addition to COPD. I have asked him to start a program of weight loss and gradual exercise. Refer to cardiology for eval. Last echo may be indicative of hypertrophic cardiomyopathy. If his  symptoms do not improve then we can consider a cardiopulmonary exercise test.   Active smoker  Continue to smoke but is trying to quit. He does not want any help quitting and want to do it on his own. I emphasized that he needs to quit, weekly otherwise his breathing will not get better. He'll use over-the-counter nicotine patches. Time spent counseling 5 mions  Moderate sleep apnea CPAP/Bipap titration ordered but not done. We will follow on results  Plan/Recommendations: - Continue Trelegy, albuterol rescue inhaler - Weight loss and exercise - Cardiology eval for severe LVH - Smoking cessation.  - CPAP/Bipap titration  Marshell Garfinkel MD [facility] Pager [phone] 10/30/2016, 9:26 AM  CC: Mikey Kirschner, MD

## 2016-10-30 NOTE — Patient Instructions (Addendum)
Continue using the trelegy inhaler. We will follow up on your sleep study and determine the CPAP pressure you need to be on Use nicotine patches OTC for smoking cessation I will refer you to cardiology for assessment of ongoing dyspnea  Follow up in 3 months

## 2016-11-01 ENCOUNTER — Encounter: Payer: Self-pay | Admitting: Family Medicine

## 2016-11-07 ENCOUNTER — Ambulatory Visit: Payer: BLUE CROSS/BLUE SHIELD | Attending: Pulmonary Disease | Admitting: Pulmonary Disease

## 2016-11-07 DIAGNOSIS — G4733 Obstructive sleep apnea (adult) (pediatric): Secondary | ICD-10-CM | POA: Diagnosis not present

## 2016-11-12 DIAGNOSIS — G4733 Obstructive sleep apnea (adult) (pediatric): Secondary | ICD-10-CM | POA: Diagnosis not present

## 2016-11-12 NOTE — Procedures (Signed)
Patient Name: Craig Knight, Craig Knight Date: 11/07/2016 Gender: Male D.O.B: June 23, 1965 Age (years): 30 Referring Provider: Marshell Garfinkel Height (inches): 67 Interpreting Physician: Kara Mead MD, ABSM Weight (lbs): 221 RPSGT: Peak, Robert BMI: 35 MRN: [id number] Neck Size: 18.50   CLINICAL INFORMATION The patient is referred for a BiPAP titration to treat sleep apnea.  Date of  Split Night :  08/05/16 Mild to moderate obstructive sleep apnea occurred during the diagnostic portion of the study (AHI = 14.4 /hour). CPAP titration was attempted but resulted in worsening apneic  vents.   SLEEP STUDY TECHNIQUE As per the AASM Manual for the Scoring of Sleep and Associated Events v2.3 (April 2016) with a hypopnea requiring 4% desaturations.  The channels recorded and monitored were frontal, central and occipital EEG, electrooculogram (EOG), submentalis EMG (chin), nasal and oral airflow, thoracic and abdominal wall motion, anterior tibialis EMG, snore microphone, electrocardiogram, and pulse oximetry. Bilevel positive airway pressure (BPAP) was initiated at the beginning of the study and titrated to treat sleep-disordered breathing.  RESPIRATORY PARAMETERS Optimal IPAP Pressure (cm): 8 AHI at Optimal Pressure (/hr) 0.6 Optimal EPAP Pressure (cm): 4   Overall Minimal O2 (%): 89.00 Minimal O2 at Optimal Pressure (%): 89.0   SLEEP ARCHITECTURE Start Time: 9:45:57 PM Stop Time: 5:04:34 AM Total Time (min): 438.6 Total Sleep Time (min): 388.0 Sleep Latency (min): 18.6 Sleep Efficiency (%): 88.5 REM Latency (min): 208.5 WASO (min): 32.0 Stage N1 (%): 8.76 Stage N2 (%): 81.32 Stage N3 (%): 0.26 Stage R (%): 9.66 Supine (%): 87.89 Arousal Index (/hr): 6.8       CARDIAC DATA The 2 lead EKG demonstrated sinus rhythm. The mean heart rate was 56.68 beats per minute. Other EKG findings include: PVCs.   LEG MOVEMENT DATA The total Periodic Limb Movements of Sleep (PLMS) were 0. The PLMS index was  0.00. A PLMS index of <15 is considered normal in adults.  IMPRESSIONS - An optimal PAP pressure was selected for this patient ( 8 /4 cm of water) - Central sleep apnea was not noted during this titration (CAI = 0.2/h). - Moderete oxygen desaturations were observed during this titration (min O2 = 89.00%). - No snoring was audible during this study. - 2-lead EKG demonstrated: PVCs - Clinically significant periodic limb movements were not noted during this study. Arousals associated with PLMs were rare.   DIAGNOSIS - Obstructive Sleep Apnea (327.23 [G47.33 ICD-10])    RECOMMENDATIONS - He did not tolerate CPAP due to difficulty exhaling against the pressure. Trial of BiPAP therapy on 8/4 cm H2O with a Medium size Resmed Nasal Pillow Mask AirFit P10 mask and heated humidification. - Avoid alcohol, sedatives and other CNS depressants that may worsen sleep apnea and disrupt normal sleep architecture. - Sleep hygiene should be reviewed to assess factors that may improve sleep quality. - Weight management and regular exercise should be initiated or continued. - Return to [facility] for re-evaluation after 4 weeks of therapy   Kara Mead MD Board Certified in [place]

## 2016-11-14 ENCOUNTER — Telehealth: Payer: Self-pay

## 2016-11-14 DIAGNOSIS — G4733 Obstructive sleep apnea (adult) (pediatric): Secondary | ICD-10-CM

## 2016-11-14 NOTE — Telephone Encounter (Signed)
Discussed with CMA. I am not sure why we need to put in pressure support to order bipap. Apparently the DME company needs this to process the order.  So we wiill put in pressure support of 4 to get the order through.   Marshell Garfinkel MD [facility] 11/14/2016, 1:37 PM

## 2016-11-14 NOTE — Telephone Encounter (Signed)
Craig Garfinkel, MD  Rigoberto Noel, MD  Cc: Shon Hale, CMA        Thanks   Lesleigh Noe- please order BiPAP therapy on 8/4 cm H2O with a Medium size  Resmed Nasal Pillow Mask AirFit P10 mask and heated  Humidification.    Pt is aware of results and voiced his understanding. Order has been placed for bipap therapy.  PM what would you like the pressure support to be?

## 2016-11-22 ENCOUNTER — Telehealth: Payer: Self-pay | Admitting: Internal Medicine

## 2016-11-22 ENCOUNTER — Encounter (INDEPENDENT_AMBULATORY_CARE_PROVIDER_SITE_OTHER): Payer: Self-pay

## 2016-11-22 ENCOUNTER — Ambulatory Visit (INDEPENDENT_AMBULATORY_CARE_PROVIDER_SITE_OTHER): Payer: BLUE CROSS/BLUE SHIELD | Admitting: Internal Medicine

## 2016-11-22 ENCOUNTER — Telehealth: Payer: Self-pay | Admitting: *Deleted

## 2016-11-22 ENCOUNTER — Encounter: Payer: Self-pay | Admitting: Internal Medicine

## 2016-11-22 VITALS — BP 110/80 | HR 69 | Ht 67.0 in | Wt 223.2 lb

## 2016-11-22 DIAGNOSIS — J449 Chronic obstructive pulmonary disease, unspecified: Secondary | ICD-10-CM | POA: Diagnosis not present

## 2016-11-22 DIAGNOSIS — R0789 Other chest pain: Secondary | ICD-10-CM

## 2016-11-22 DIAGNOSIS — R0602 Shortness of breath: Secondary | ICD-10-CM

## 2016-11-22 DIAGNOSIS — Z79899 Other long term (current) drug therapy: Secondary | ICD-10-CM

## 2016-11-22 NOTE — Telephone Encounter (Signed)
Left message for patient to return call to discuss testing. MD has decided a coronary CT will be most appropriate, given that patient had low risk myoview in Jan 2018 ordered by [facility] staff. Coronary CT has been ordered + BMET.

## 2016-11-22 NOTE — Progress Notes (Signed)
OFFICE CONSULT NOTE  Chief Complaint:  Chest pressure, dyspnea and exertion, fatigue  Primary Care Physician: Mikey Kirschner, MD  HPI:  Craig Knight is a 51 y.o. male who is being seen today for the evaluation of the above complaints at the request of Dr. Vaughan Browner. Craig Knight is currently referred by his pulmonologist for evaluation of a progressive shortness of breath. He has a past medical history significant for Gold B COPD, asthma, chronic bronchitis and hypertension which was poorly controlled. He was scheduled to see me as a new patient, however he is an established patient of Dr. Harl Bowie in [place] and was seen in February 2018 by Beckie Busing, NP, in February 2018. He has a history of echo cardiograms in the past including last study in December 2017 which showed severe concentric LVH with EF 55-60% and grade 1 diastolic dysfunction. He apparently underwent a Myoview stress test in January 2018 which showed a hypertensive response to exercise and no ischemic perfusion defects. LVEF was only 38%.   PMHx:  Past Medical History:  Diagnosis Date  . Asthma   . Bronchitis   . Diabetes mellitus without complication ([place])   . Hypertension     Past Surgical History:  Procedure Laterality Date  . COLONOSCOPY N/A 09/18/2016   Procedure: COLONOSCOPY;  Surgeon: Danie Binder, MD;  [facility];  Service: Endoscopy;  Laterality: N/A;  10:30 AM  . lipoma removal      FAMHx:  Family History  Problem Relation Age of Onset  . Stroke Mother   . Heart attack Father 16       CABG  . Hypertension Sister   . Stroke Brother   . Hypertension Brother   . Stomach cancer Brother   . Stroke Maternal Grandmother   . Heart attack Maternal Grandfather   . Heart attack Paternal Grandmother     SOCHx:   reports that he has been smoking Cigarettes.  He has a 6.25 pack-year smoking history. He quit smokeless tobacco use about 16 months ago. He reports that he drinks  alcohol. He reports that he does not use drugs.  ALLERGIES:  Allergies  Allergen Reactions  . Dust Mite Extract   . Lisinopril Cough    ROS: Pertinent items noted in HPI and remainder of comprehensive ROS otherwise negative.  HOME MEDS: Current Outpatient Prescriptions on File Prior to Visit  Medication Sig Dispense Refill  . albuterol (PROVENTIL HFA;VENTOLIN HFA) 108 (90 Base) MCG/ACT inhaler Inhale 2 puffs into the lungs every 6 (six) hours as needed for wheezing or shortness of breath. 1 Inhaler 2  . amLODipine (NORVASC) 10 MG tablet Take 1 tablet (10 mg total) by mouth daily. 30 tablet 11  . aspirin EC 81 MG tablet Take 1 tablet (81 mg total) by mouth daily.    . chlorthalidone (HYGROTON) 25 MG tablet Take 1 tablet (25 mg total) by mouth daily. 90 tablet 2  . hydrALAZINE (APRESOLINE) 25 MG tablet Take 25 mg by mouth daily.    . isosorbide mononitrate (IMDUR) 60 MG 24 hr tablet Take 1 tablet (60 mg total) by mouth daily. 30 tablet 5  . losartan (COZAAR) 25 MG tablet Take 25 mg by mouth daily.    . metFORMIN (GLUCOPHAGE) 500 MG tablet Take 1 tablet (500 mg total) by mouth daily with breakfast. 30 tablet 5  . nitroGLYCERIN (NITROSTAT) 0.4 MG SL tablet Place 1 tablet (0.4 mg total) under the tongue every 5 (five) minutes as  needed for chest pain. 30 tablet 0  . Tiotropium Bromide-Olodaterol (STIOLTO RESPIMAT) 2.5-2.5 MCG/ACT AERS Inhale 2 puffs into the lungs daily. 1 Inhaler 5   No current facility-administered medications on file prior to visit.     LABS/IMAGING: No results found for this or any previous visit (from the past 48 hour(s)). No results found.  LIPID PANEL:    Component Value Date/Time   CHOL 157 06/07/2015 0815   TRIG 85 06/07/2015 0815   HDL 34 (L) 06/07/2015 0815   CHOLHDL 4.6 06/07/2015 0815   VLDL 17 06/07/2015 0815   LDLCALC 106 (H) 06/07/2015 0815    WEIGHTS: Wt Readings from Last 3 Encounters:  11/22/16 223 lb 3.2 oz (101.2 kg)  10/30/16 225 lb 9.6  oz (102.3 kg)  10/10/16 222 lb 2 oz (100.8 kg)    VITALS: BP 110/80   Pulse 69   Ht 5\' 7"  (1.702 m)   Wt 223 lb 3.2 oz (101.2 kg)   BMI 34.96 kg/m   EXAM: General appearance: alert and no distress Neck: no carotid bruit, no JVD and thyroid not enlarged, symmetric, no tenderness/mass/nodules Lungs: clear to auscultation bilaterally Heart: regular rate and rhythm Abdomen: soft, non-tender; bowel sounds normal; no masses,  no organomegaly Extremities: extremities normal, atraumatic, no cyanosis or edema Pulses: 2+ and symmetric Skin: Skin color, texture, turgor normal. No rashes or lesions Neurologic: Grossly normal Psych: Pleasant  EKG: Normal sinus rhythm at 69, possible left atrial enlargement, LVH by voltage - personally reviewed  ASSESSMENT: 1. Persistent chest pressure and shortness of breath with exertion 2. Low risk Myoview stress test January 2018 3. Gold B COPD 4. Severe LVH with normal LVEF 5. DM2  PLAN: 1.   Craig Knight reports persistent chest pressure with a low risk Myoview however EF was only 38%. He also reports fatigue. He has multiple cardiac risk factors including hypertension which has been poorly controlled, severe LVH, ongoing tobacco abuse and type 2 diabetes. Since he is ready had a Myoview earlier this year and recent echocardiogram, there is little utility in repeating the studies. Given his low EF, the possible he could've had multivessel coronary disease and balanced ischemia. To further evaluate this, I recommend a CT coronary angiogram a to conclusively rule out any coronary artery disease. I'll follow-up with him on those results but otherwise she should follow-up in the future with a cardiology in [place].  Thanks for the kind referral.  Pixie Casino, MD, [place]  Attending Cardiologist  Direct Dial: [phone]  Fax: [phone]  Website:  [email or web address] 11/22/2016, 9:54 AM

## 2016-11-22 NOTE — Patient Instructions (Signed)
Your physician has requested that you have a lexiscan myoview. For further information please visit [email or web address]. Please follow instruction sheet, as given.  Your physician recommends that you schedule a follow-up appointment with Dr. Hilty after your stress test.   

## 2016-11-22 NOTE — Telephone Encounter (Signed)
Patient returned call. Agrees with plan to cancel myoview appt and scheduled coronary CT. He is aware he needs a BMET prior to test. He will go to [facility] in [place]. Test order has been sent to [facility]. Patient is aware he will be contacted to set up this study.

## 2016-11-22 NOTE — Telephone Encounter (Signed)
Patient did not want to schedule folow up visit with Dr. Layla Barter to wait until after his stress test on 11/27/16

## 2016-11-26 LAB — BASIC METABOLIC PANEL
BUN/Creatinine Ratio: 12 (calc) (ref 6–22)
BUN: 21 mg/dL (ref 7–25)
CALCIUM: 9.6 mg/dL (ref 8.6–10.3)
CO2: 27 mmol/L (ref 20–32)
CREATININE: 1.78 mg/dL — AB (ref 0.70–1.33)
Chloride: 102 mmol/L (ref 98–110)
GLUCOSE: 131 mg/dL (ref 65–139)
POTASSIUM: 4 mmol/L (ref 3.5–5.3)
SODIUM: 138 mmol/L (ref 135–146)

## 2016-11-27 ENCOUNTER — Encounter (HOSPITAL_COMMUNITY): Payer: BLUE CROSS/BLUE SHIELD

## 2016-12-20 ENCOUNTER — Encounter: Payer: Self-pay | Admitting: Internal Medicine

## 2016-12-25 ENCOUNTER — Telehealth: Payer: Self-pay | Admitting: Internal Medicine

## 2016-12-25 MED ORDER — METOPROLOL TARTRATE 50 MG PO TABS
ORAL_TABLET | ORAL | 0 refills | Status: DC
Start: 2016-12-25 — End: 2017-04-21

## 2016-12-25 NOTE — Telephone Encounter (Signed)
Left detailed message that patient will need to HOLD metformin 24 hours prior to coronary CT. Letter w/instructions mailed to patient.

## 2017-01-03 ENCOUNTER — Ambulatory Visit: Payer: BLUE CROSS/BLUE SHIELD | Admitting: Family Medicine

## 2017-01-08 ENCOUNTER — Ambulatory Visit (HOSPITAL_COMMUNITY)
Admission: RE | Admit: 2017-01-08 | Discharge: 2017-01-08 | Disposition: A | Discharge status: Home / Self Care | Payer: BLUE CROSS/BLUE SHIELD | Source: Ambulatory Visit | Attending: Internal Medicine | Admitting: Internal Medicine

## 2017-01-08 DIAGNOSIS — R079 Chest pain, unspecified: Secondary | ICD-10-CM | POA: Diagnosis not present

## 2017-01-08 DIAGNOSIS — R0602 Shortness of breath: Secondary | ICD-10-CM | POA: Diagnosis not present

## 2017-01-08 DIAGNOSIS — R0789 Other chest pain: Secondary | ICD-10-CM

## 2017-01-08 DIAGNOSIS — J449 Chronic obstructive pulmonary disease, unspecified: Secondary | ICD-10-CM

## 2017-01-08 DIAGNOSIS — R911 Solitary pulmonary nodule: Secondary | ICD-10-CM | POA: Insufficient documentation

## 2017-01-08 DIAGNOSIS — I281 Aneurysm of pulmonary artery: Secondary | ICD-10-CM | POA: Diagnosis not present

## 2017-01-08 DIAGNOSIS — I77810 Thoracic aortic ectasia: Secondary | ICD-10-CM | POA: Diagnosis not present

## 2017-01-08 IMAGING — CT CT HEART MORP W/ CTA COR W/ SCORE W/ CA W/CM &/OR W/O CM
4 of 7 series · 8 of 20 positions shown, 9 images · non-contrast
Comparison: No priors.

CLINICAL DATA: Chest pain

EXAM:
Cardiac CTA
MEDICATIONS:
Sub lingual nitro. 4mg and lopressor 10mg IV
TECHNIQUE: The patient was scanned on a Siemens Force [REDACTED]ice scanner. Gantry
rotation speed was 240 msecs. Collimation was 0.6 mm. A 100 kV
prospective scan was triggered in the ascending thoracic aorta at
35-75% of the R-R interval. Average HR during the scan was 65 bpm.
The 3D data set was interpreted on a dedicated work station using
MPR, MIP and VRT modes. A total of 80cc of contrast was used.

[Series 6: best diast 69 % · axial · 0.40mm/px · z∈[+1541,+1590]mm · 2 of 372 slices shown, 3 images]
[im 124/372  vessel]
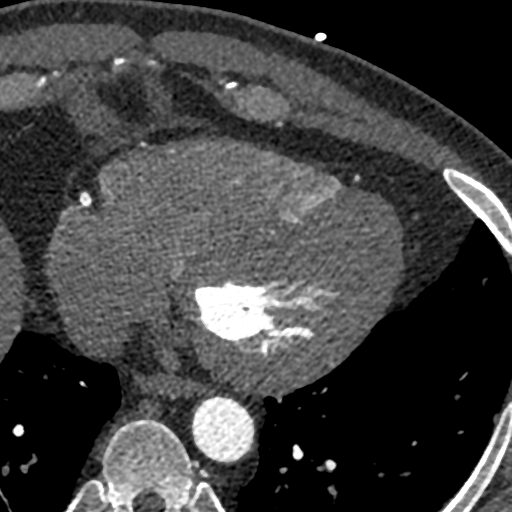
[im 124/372  lung]
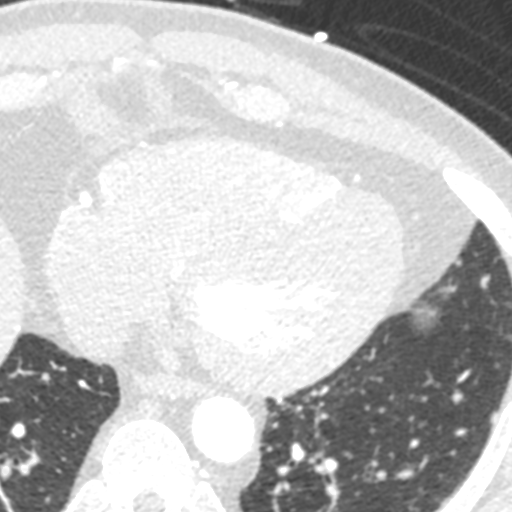
[im 248/372  vessel]
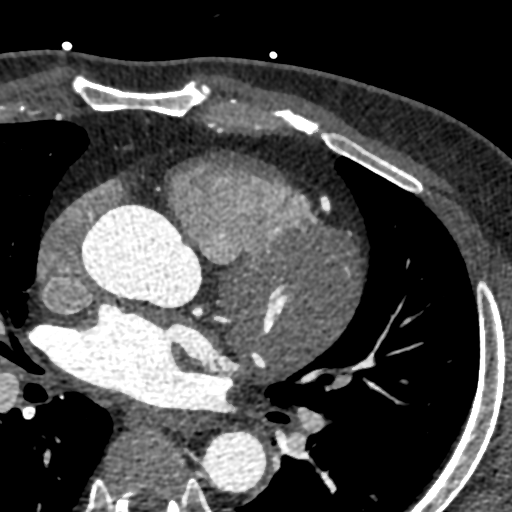

[Series 7: best syst 41 % · axial · 0.40mm/px · z∈[+1541,+1590]mm · 2 of 372 slices shown]
[im 124/372  vessel]
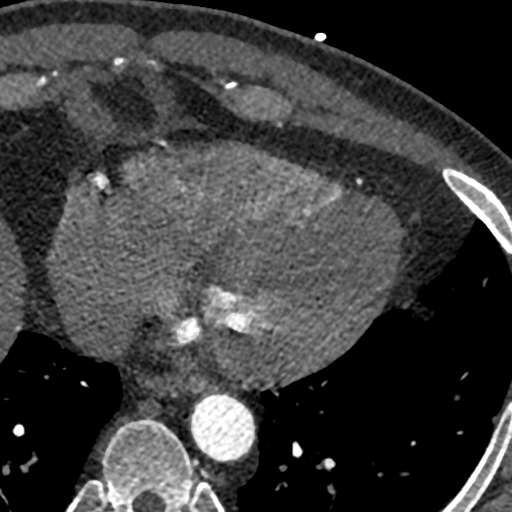
[im 248/372  vessel]
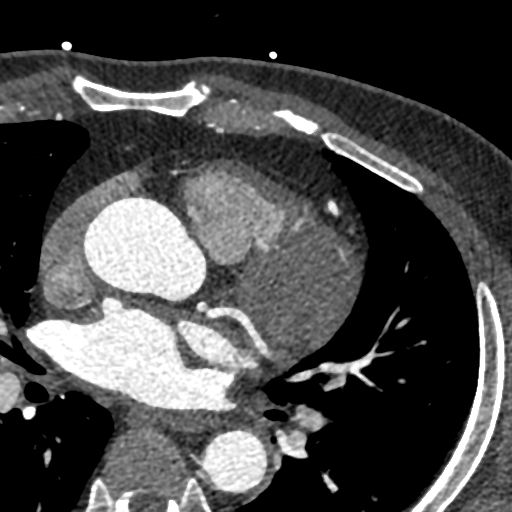

[Series 8: ts diast sharp 69 % · axial · 0.40mm/px · z∈[+1541,+1590]mm · 2 of 372 slices shown]
[im 124/372  lung]
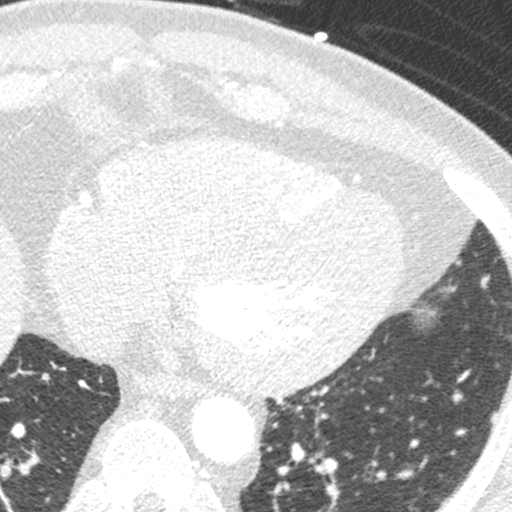
[im 248/372  lung]
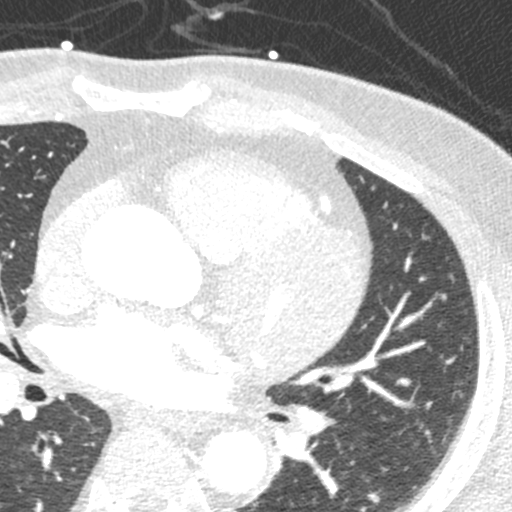

[Series 9: ts syst sharp 41 % · axial · 0.40mm/px · z∈[+1541,+1590]mm · 2 of 372 slices shown]
[im 124/372  lung]
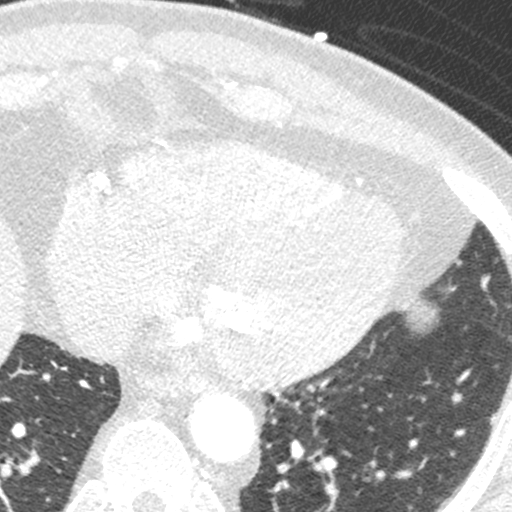
[im 248/372  lung]
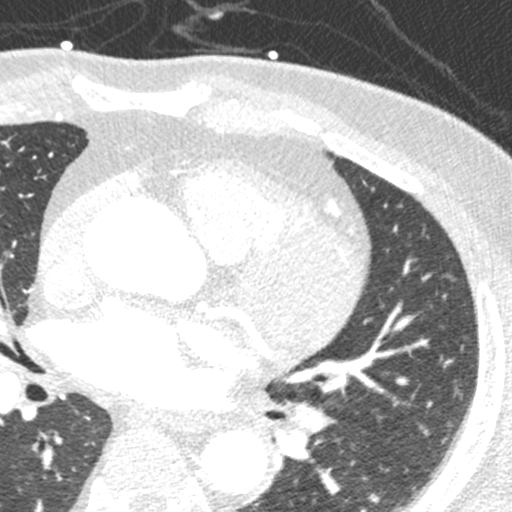

[8 of 20 positions shown; findings below may reference images not displayed]

FINDINGS: Non-cardiac: See separate report from [REDACTED].

Calcium Score:  11 Agatston units.

Coronary Arteries: Right dominant with no anomalies

LM:  No plaque or stenosis.

LAD system:  No plaque or stenosis.

Circumflex system: Large ramus without obvious plaque or stenosis.
No plaque or stenosis in the AV LCx.

RCA system: Small area of mixed plaque in the ostial PDA with mild
stenosis. No other significant disease noted in RCA system.
IMPRESSION: 1. Coronary artery calcium score 11 Agatston units, placing the
patient in the 63rd percentile for age and gender. This suggests
intermediate risk for future cardiac events.

2.  No hemodynamically significant stenoses in the coronary tree.

MACHORRO

EXAM:
OVER-READ INTERPRETATION  CT CHEST

The following report is an over-read performed by radiologist Dr.
over-read does not include interpretation of cardiac or coronary
anatomy or pathology. The coronary calcium score/coronary CTA
interpretation by the cardiologist is attached.
FINDINGS: 4 mm subpleural nodule in the right middle lobe (axial image 24 of
series 12), nonspecific but statistically likely a benign subpleural
lymph node. Mild linear scarring in the left lower lobe. Within the
visualized portions of the thorax there are no larger more
suspicious appearing pulmonary nodules or masses, there is no acute
consolidative airspace disease, no pleural effusions, no
pneumothorax and no lymphadenopathy. Mild ectasia of the ascending
thoracic aorta (4.0 cm in diameter). Dilatation of the pulmonic
trunk (3.9 cm in diameter), concerning for potential pulmonary
arterial hypertension. Visualized portions of the upper abdomen are
unremarkable. There are no aggressive appearing lytic or blastic
lesions noted in the visualized portions of the skeleton.
IMPRESSION: 1. Tiny 4 mm subpleural nodule in the right middle lobe, nonspecific
but statistically likely a benign subpleural lymph node. No
follow-up needed if patient is low-risk. Non-contrast chest CT can
be considered in 12 months if patient is high-risk. This
recommendation follows the consensus statement: Guidelines for
Management of Incidental Pulmonary Nodules Detected on CT Images:
2. Ectasia of the ascending thoracic aorta measuring 4 cm in
diameter. Recommend annual imaging followup by CTA or MRA. This
recommendation follows [3G]
ACCF/AHA/AATS/ACR/ASA/SCA/MACHORRO/MACHORRO/MACHORRO/MACHORRO Guidelines for the
Diagnosis and Management of Patients with Thoracic Aortic Disease.
Circulation. [3G]; 121: [id number].
3. Dilatation of the pulmonic trunk (3.9 cm in diameter), which
could suggest underlying pulmonary arterial hypertension.

## 2017-01-08 MED ORDER — METOPROLOL TARTRATE 5 MG/5ML IV SOLN
INTRAVENOUS | Status: AC
Start: 2017-01-08 — End: 2017-01-08
  Administered 2017-01-08: 5 mg via INTRAVENOUS
  Filled 2017-01-08: qty 5

## 2017-01-08 MED ORDER — NITROGLYCERIN 0.4 MG SL SUBL
0.4000 mg | SUBLINGUAL_TABLET | Freq: Once | SUBLINGUAL | Status: AC
  Administered 2017-01-08: 0.4 mg via SUBLINGUAL
  Filled 2017-01-08: qty 25

## 2017-01-08 MED ORDER — NITROGLYCERIN 0.4 MG SL SUBL
SUBLINGUAL_TABLET | SUBLINGUAL | Status: AC
  Administered 2017-01-08: 0.4 mg via SUBLINGUAL
  Filled 2017-01-08: qty 1

## 2017-01-08 MED ORDER — IOPAMIDOL (ISOVUE-370) INJECTION 76%
INTRAVENOUS | Status: AC
  Filled 2017-01-08: qty 100

## 2017-01-08 MED ORDER — METOPROLOL TARTRATE 5 MG/5ML IV SOLN
5.0000 mg | Freq: Once | INTRAVENOUS | Status: AC
  Administered 2017-01-08: 5 mg via INTRAVENOUS
  Filled 2017-01-08: qty 5

## 2017-01-14 ENCOUNTER — Ambulatory Visit (INDEPENDENT_AMBULATORY_CARE_PROVIDER_SITE_OTHER): Payer: BLUE CROSS/BLUE SHIELD | Admitting: Pulmonary Disease

## 2017-01-14 ENCOUNTER — Encounter: Payer: Self-pay | Admitting: Pulmonary Disease

## 2017-01-14 VITALS — BP 138/78 | HR 92 | Ht 67.0 in | Wt 222.4 lb

## 2017-01-14 DIAGNOSIS — J449 Chronic obstructive pulmonary disease, unspecified: Secondary | ICD-10-CM

## 2017-01-14 DIAGNOSIS — G4733 Obstructive sleep apnea (adult) (pediatric): Secondary | ICD-10-CM

## 2017-01-14 NOTE — Progress Notes (Signed)
Craig Knight    [id number]    [birth year]  Primary Care Physician:Default, Provider, MD  Referring Physician: No referring provider defined for this encounter.  Chief complaint:  Follow up for  COPD GOLD B (CAT score 29, 0 exacerbations)  HPI: Craig Knight is 51 year old with past medical history of childhood asthma, hypertension, active smoker. He was diagnosed with asthma as a child which got better in adulthood. 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 He reports mainly dyspnea on exertion and does not have dyspnea on rest, wheezing. He has daily symptoms of cough with white sputum production.nHe does not have seasonal allergies, sensitivities to cats, dogs, strong perfumes, no heartburn symptoms  He has over 38-pack-year smoking history and is trying to quit. He is down to 1 pack per week now. 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  Interim history: He continues on trelegy inhaler and reports that dyspnea is unchanged.  Complains of dyspnea with activity, denies any cough, sputum production, wheezing He quit smoking 2 weeks ago.  Evaluated by cardiology and had a CT coronary done which showed intermediate risk for ischemia.  Lung images shows a 4 mm nodule with no other acute lung abnormality.  Outpatient Encounter Medications as of 01/14/2017  Medication Sig  . albuterol (PROVENTIL HFA;VENTOLIN HFA) 108 (90 Base) MCG/ACT inhaler Inhale 2 puffs into the lungs every 6 (six) hours as needed for wheezing or shortness of breath.  Marland Kitchen amLODipine (NORVASC) 10 MG tablet Take 1 tablet (10 mg total) by mouth daily.  Marland Kitchen aspirin EC 81 MG tablet Take 1 tablet (81 mg total) by mouth daily.  . chlorthalidone (HYGROTON) 25 MG tablet Take 1 tablet (25 mg total) by mouth daily.  . hydrALAZINE (APRESOLINE) 25 MG tablet Take 25 mg by mouth daily.  . isosorbide  mononitrate (IMDUR) 60 MG 24 hr tablet Take 1 tablet (60 mg total) by mouth daily.  Marland Kitchen losartan (COZAAR) 25 MG tablet Take 25 mg by mouth daily.  . metFORMIN (GLUCOPHAGE) 500 MG tablet Take 1 tablet (500 mg total) by mouth daily with breakfast.  . metoprolol tartrate (LOPRESSOR) 50 MG tablet Take 1 tablet by mouth ONE HOUR prior to your CT test.  . nitroGLYCERIN (NITROSTAT) 0.4 MG SL tablet Place 1 tablet (0.4 mg total) under the tongue every 5 (five) minutes as needed for chest pain.  . Tiotropium Bromide-Olodaterol (STIOLTO RESPIMAT) 2.5-2.5 MCG/ACT AERS Inhale 2 puffs into the lungs daily.   No facility-administered encounter medications on file as of 01/14/2017.     Allergies as of 01/14/2017 - Review Complete 01/14/2017  Allergen Reaction Noted  . Dust mite extract  10/13/2013  . Lisinopril Cough 06/07/2015    Past Medical History:  Diagnosis Date  . Asthma   . Bronchitis   . Diabetes mellitus without complication ([place])   . Hypertension     Past Surgical History:  Procedure Laterality Date  . COLONOSCOPY N/A 09/18/2016   Performed by Danie Binder, MD at [facility]  . lipoma removal      Family History  Problem Relation Age of Onset  . Stroke Mother   . Heart attack Father 6       CABG  . Hypertension Sister   . Stroke Brother   . Hypertension Brother   .  Stomach cancer Brother   . Stroke Maternal Grandmother   . Heart attack Maternal Grandfather   . Heart attack Paternal Grandmother     Social History   Socioeconomic History  . Marital status: Married    Spouse name: Not on file  . Number of children: Not on file  . Years of education: Not on file  . Highest education level: Not on file  Social Needs  . Financial resource strain: Not on file  . Food insecurity - worry: Not on file  . Food insecurity - inability: Not on file  . Transportation needs - medical: Not on file  . Transportation needs - non-medical: Not on file  Occupational History  .  Not on file  Tobacco Use  . Smoking status: Current Some Day Smoker    Packs/day: 0.25    Years: 25.00    Pack years: 6.25    Types: Cigarettes  . Smokeless tobacco: Former Systems developer    Quit date: 07/11/2015  . Tobacco comment: a pack will last a month-05/29/2016  Substance and Sexual Activity  . Alcohol use: Yes    Alcohol/week: 0.0 oz    Comment: occasionally  . Drug use: No  . Sexual activity: Yes    Birth control/protection: None  Other Topics Concern  . Not on file  Social History Narrative  . Not on file    Review of systems: Review of Systems  Constitutional: Negative for fever and chills.  HENT: Negative.   Eyes: Negative for blurred vision.  Respiratory: as per HPI  Cardiovascular: Negative for chest pain and palpitations.  Gastrointestinal: Negative for vomiting, diarrhea, blood per rectum. Genitourinary: Negative for dysuria, urgency, frequency and hematuria.  Musculoskeletal: Negative for myalgias, back pain and joint pain.  Skin: Negative for itching and rash.  Neurological: Negative for dizziness, tremors, focal weakness, seizures and loss of consciousness.  Endo/Heme/Allergies: Negative for environmental allergies.  Psychiatric/Behavioral: Negative for depression, suicidal ideas and hallucinations.  All other systems reviewed and are negative.  Physical Exam: Blood pressure 138/78, pulse 92, height 5\' 7"  (1.702 m), weight 222 lb 6 oz (100.9 kg), SpO2 94 %. Gen:      No acute distress HEENT:  EOMI, sclera anicteric Neck:     No masses; no thyromegaly Lungs:    Clear to auscultation bilaterally; normal respiratory effort CV:         Regular rate and rhythm; no murmurs Abd:      + bowel sounds; soft, non-tender; no palpable masses, no distension Ext:    No edema; adequate peripheral perfusion Skin:      Warm and dry; no rash Neuro: alert and oriented x 3 Psych: normal mood and affect  Data Reviewed: FENO 05/29/16- 12 FENO 07/30/16- 9  PFTs 04/24/16 FVC 2.01  (62%), FEV1 1.65 (53%), F/F 69, TLC 75%, RV/TLC 130% DLCO 65%, DLCO/VA 102% Moderate to severe obstructive airway disease with no bronchodilator response. Moderate diffusion impairment which corrects for alveolar volume Mild reduction in total lung capacity, airtrapping.  Chest x-ray 02/16/16-no acute cardio pulmonary abnormality. Chest x-ray 06/04/16- cardio megaly, left base atelectasis CT abdomen 06/04/16- mild left base atelectasis with scarring. No acute infiltrate or fibrosis Coronary 01/08/17- Lung images show 4 mm right middle lobe lung nodule with no acute lung infiltrate or consolidation. I reviewed all images personally.  CBC with diff 06/04/16- WBC 13.6. Eos 2%, absolute eos count 272 Blood allergy profile- IgE 133, sensitive to weed, grass and elm.   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 Grade 1   diastolic dysfunction. Mild left atrial enlargement. Trivial   tricuspid regurgitation.  Sleep study 12/23/13 Mild sleep apnea.  Sleep study 08/05/16 Mild to moderate obstructive sleep apnea occurred during the diagnostic portion of the study (AHI = 14.4 /hour). CPAP titration was attempted but resulted in worsening apneic events. A formal CPAP/BiPAP titration study is recommended.  Titration study 11/07/16 He did not tolerate CPAP due to difficulty exhaling against the pressure. Trial of BiPAP therapy on 8/4 cm H2O with a Medium size Resmed Nasal Pillow Mask AirFit P10 mask and heated humidification  Assessment:  COPD GOLD B Review of his PFTs show obstruction with no bronchodilator response. In addition he has air trapping consistent with emphysema. There is no bronchodilator response, FENO is low and his clinical picture is not consistent with asthma. There is mild restriction with marked reduction in ERV secondary to body habitus and obesity. There is no suspicion for interstitial lung disease  on his chest imaging.  Continues on trelegy inhaler and albuterol as needed.  He did not desat on exertion today I encouraged him to try weight loss and exercise as his weight may be playing a role in dyspnea We discussed pulmonary rehab but he would like to defer this for now.  Ex smoker Quit 2 weeks ago.  I congratulated him and encouraged him to not resume smoking again  Moderate sleep apnea Started BiPAP last week and he appears to be tolerating it well..  Will examine download at time of return visit  Subcentimeter pulmonary nodule 4 mm right middle lobe nodule noted on coronary CT.  Follow-up with repeat scan in 1 year.  Plan/Recommendations: - Continue Trelegy, albuterol rescue inhaler - Weight loss and exercise - Continue Bipap - Follow up CT in 1 year  Marshell Garfinkel MD [facility] Pager [phone] 01/14/2017, 11:07 AM  CC: No ref. provider found

## 2017-01-14 NOTE — Patient Instructions (Signed)
Your oxygen levels did not go down on exertion.  You will not require supplemental oxygen Continue using inhalers as prescribed Please work on trying to lose weight and start a slow exercise program at home. Please follow-up with your cardiologist for review of your recent studies. Please continue to use your BiPAP. Follow-up in 3 months.

## 2017-01-21 ENCOUNTER — Telehealth: Payer: Self-pay | Admitting: Internal Medicine

## 2017-01-21 DIAGNOSIS — I712 Thoracic aortic aneurysm, without rupture, unspecified: Secondary | ICD-10-CM

## 2017-01-21 DIAGNOSIS — I77810 Thoracic aortic ectasia: Secondary | ICD-10-CM

## 2017-01-21 DIAGNOSIS — R911 Solitary pulmonary nodule: Secondary | ICD-10-CM

## 2017-01-21 DIAGNOSIS — Q254 Congenital malformation of aorta unspecified: Secondary | ICD-10-CM

## 2017-01-21 NOTE — Telephone Encounter (Signed)
-----   Message from Pixie Casino, MD sent at 01/09/2017  4:20 PM EST ----- Please inform him of the findings - order the CT aortogram and have him follow-up with me after that in 1 year.  Thanks.  -Dr. Lemmie Evens

## 2017-01-21 NOTE — Telephone Encounter (Signed)
Patient aware of results. Agrees w/follow up CT testing in 1 year. Aware MD would like him to f/up in 1 year, after CT.

## 2017-03-07 ENCOUNTER — Ambulatory Visit (INDEPENDENT_AMBULATORY_CARE_PROVIDER_SITE_OTHER): Payer: BLUE CROSS/BLUE SHIELD | Admitting: Pulmonary Disease

## 2017-03-07 ENCOUNTER — Other Ambulatory Visit (INDEPENDENT_AMBULATORY_CARE_PROVIDER_SITE_OTHER): Payer: BLUE CROSS/BLUE SHIELD

## 2017-03-07 ENCOUNTER — Encounter: Payer: Self-pay | Admitting: Pulmonary Disease

## 2017-03-07 VITALS — BP 122/86 | HR 71 | Ht 67.0 in | Wt 223.0 lb

## 2017-03-07 DIAGNOSIS — G4733 Obstructive sleep apnea (adult) (pediatric): Secondary | ICD-10-CM | POA: Diagnosis not present

## 2017-03-07 DIAGNOSIS — J449 Chronic obstructive pulmonary disease, unspecified: Secondary | ICD-10-CM

## 2017-03-07 LAB — BASIC METABOLIC PANEL
BUN: 23 mg/dL (ref 6–23)
CO2: 30 mEq/L (ref 19–32)
Calcium: 10 mg/dL (ref 8.4–10.5)
Chloride: 101 mEq/L (ref 96–112)
Creatinine, Ser: 1.64 mg/dL — ABNORMAL HIGH (ref 0.40–1.50)
GFR: 56.91 mL/min — ABNORMAL LOW (ref >60.00)
Glucose, Bld: 89 mg/dL (ref 70–99)
POTASSIUM: 3.9 meq/L (ref 3.5–5.1)
Sodium: 138 mEq/L (ref 135–145)

## 2017-03-07 MED ORDER — IPRATROPIUM-ALBUTEROL 0.5-2.5 (3) MG/3ML IN SOLN: 3.0000 mL | RESPIRATORY_TRACT | 2 refills | Status: DC | PRN

## 2017-03-07 NOTE — Patient Instructions (Signed)
We will get a CT angiogram for evaluation of blood clots Continue using your inhalers We ordered a nebulizer machine and DuoNeb's every 4 hours as needed Please let us know if you are ready for pulmonary rehab Will be ordered a mask fitting to see if we can get a better mask or nasal pillows. Follow-up in 2-4 weeks.

## 2017-03-07 NOTE — Progress Notes (Signed)
Per Sherri in [facility] pt is needing BMET stat Due to having kidney issues and CT angio  Placed lab request today Nothing further needed

## 2017-03-07 NOTE — Progress Notes (Signed)
Craig Knight    [id number]    02/01/1966  Primary Care Physician:Default, Provider, MD  Referring Physician: No referring provider defined for this encounter.  Chief complaint:  Follow up for  COPD GOLD B (CAT score 29, 0 exacerbations)  HPI: Craig Knight is 52 year old with past medical history of childhood asthma, hypertension, active smoker. He was diagnosed with asthma as a child which got better in adulthood. 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 He reports mainly dyspnea on exertion and does not have dyspnea on rest, wheezing. He has daily symptoms of cough with white sputum production.nHe does not have seasonal allergies, sensitivities to cats, dogs, strong perfumes, no heartburn symptoms. Evaluated by cardiology and had a CT coronary done which showed intermediate risk for ischemia.  Lung images shows a 4 mm nodule with no other acute lung abnormality.  He has over 38-pack-year smoking history and is trying to quit. He is down to 1 pack per week now. 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  Interim history: Continues on the trelegy inhaler.  Reports worsening dyspnea on exertion.  Denies any cough, sputum production, wheezing.  He quit smoking in November 2018 and has not started it again. Started on BiPAP for severe sleep apnea.  He is tolerating it well however the mask is causing some irritation of the skin.  Outpatient Encounter Medications as of 03/07/2017  Medication Sig  . albuterol (PROVENTIL HFA;VENTOLIN HFA) 108 (90 Base) MCG/ACT inhaler Inhale 2 puffs into the lungs every 6 (six) hours as needed for wheezing or shortness of breath.  Marland Kitchen amLODipine (NORVASC) 10 MG tablet Take 1 tablet (10 mg total) by mouth daily.  Marland Kitchen aspirin EC 81 MG tablet Take 1 tablet (81 mg total) by mouth daily.  . chlorthalidone (HYGROTON) 25 MG tablet Take 1  tablet (25 mg total) by mouth daily.  . hydrALAZINE (APRESOLINE) 25 MG tablet Take 25 mg by mouth daily.  . isosorbide mononitrate (IMDUR) 60 MG 24 hr tablet Take 1 tablet (60 mg total) by mouth daily.  Marland Kitchen losartan (COZAAR) 25 MG tablet Take 25 mg by mouth daily.  . metFORMIN (GLUCOPHAGE) 500 MG tablet Take 1 tablet (500 mg total) by mouth daily with breakfast.  . metoprolol tartrate (LOPRESSOR) 50 MG tablet Take 1 tablet by mouth ONE HOUR prior to your CT test.  . nitroGLYCERIN (NITROSTAT) 0.4 MG SL tablet Place 1 tablet (0.4 mg total) under the tongue every 5 (five) minutes as needed for chest pain.  . Tiotropium Bromide-Olodaterol (STIOLTO RESPIMAT) 2.5-2.5 MCG/ACT AERS Inhale 2 puffs into the lungs daily.   No facility-administered encounter medications on file as of 03/07/2017.     Allergies as of 03/07/2017 - Review Complete 03/07/2017  Allergen Reaction Noted  . Dust mite extract  10/13/2013  . Lisinopril Cough 06/07/2015    Past Medical History:  Diagnosis Date  . Asthma   . Bronchitis   . Diabetes mellitus without complication ([place])   . Hypertension     Past Surgical History:  Procedure Laterality Date  . COLONOSCOPY N/A 09/18/2016   Procedure: COLONOSCOPY;  Surgeon: Danie Binder, MD;  Location: [facility];  Service: Endoscopy;  Laterality: N/A;  10:30 AM  . lipoma removal      Family History  Problem Relation Age of  Onset  . Stroke Mother   . Heart attack Father 52       CABG  . Hypertension Sister   . Stroke Brother   . Hypertension Brother   . Stomach cancer Brother   . Stroke Maternal Grandmother   . Heart attack Maternal Grandfather   . Heart attack Paternal Grandmother     Social History   Socioeconomic History  . Marital status: Married    Spouse name: Not on file  . Number of children: Not on file  . Years of education: Not on file  . Highest education level: Not on file  Social Needs  . Financial resource strain: Not on file  . Food  insecurity - worry: Not on file  . Food insecurity - inability: Not on file  . Transportation needs - medical: Not on file  . Transportation needs - non-medical: Not on file  Occupational History  . Not on file  Tobacco Use  . Smoking status: Current Some Day Smoker    Packs/day: 0.25    Years: 25.00    Pack years: 6.25    Types: Cigarettes  . Smokeless tobacco: Former Systems developer    Quit date: 07/11/2015  . Tobacco comment: a pack will last a month-05/29/2016  Substance and Sexual Activity  . Alcohol use: Yes    Alcohol/week: 0.0 oz    Comment: occasionally  . Drug use: No  . Sexual activity: Yes    Birth control/protection: None  Other Topics Concern  . Not on file  Social History Narrative  . Not on file    Review of systems: Review of Systems  Constitutional: Negative for fever and chills.  HENT: Negative.   Eyes: Negative for blurred vision.  Respiratory: as per HPI  Cardiovascular: Negative for chest pain and palpitations.  Gastrointestinal: Negative for vomiting, diarrhea, blood per rectum. Genitourinary: Negative for dysuria, urgency, frequency and hematuria.  Musculoskeletal: Negative for myalgias, back pain and joint pain.  Skin: Negative for itching and rash.  Neurological: Negative for dizziness, tremors, focal weakness, seizures and loss of consciousness.  Endo/Heme/Allergies: Negative for environmental allergies.  Psychiatric/Behavioral: Negative for depression, suicidal ideas and hallucinations.  All other systems reviewed and are negative.  Physical Exam: Blood pressure 122/86, pulse 71, height 5\' 7"  (1.702 m), weight 223 lb (101.2 kg), SpO2 99 %. Gen:      No acute distress HEENT:  EOMI, sclera anicteric Neck:     No masses; no thyromegaly Lungs:    Clear to auscultation bilaterally; normal respiratory effort CV:         Regular rate and rhythm; no murmurs Abd:      + bowel sounds; soft, non-tender; no palpable masses, no distension Ext:    No edema;  adequate peripheral perfusion Skin:      Warm and dry; no rash Neuro: alert and oriented x 3 Psych: normal mood and affect  Data Reviewed: FENO 05/29/16- 12 FENO 07/30/16- 9  PFTs 04/24/16 FVC 2.01 (62%), FEV1 1.65 (53%), F/F 69, TLC 75%, RV/TLC 130% DLCO 65%, DLCO/VA 102% Moderate to severe obstructive airway disease with no bronchodilator response. Moderate diffusion impairment which corrects for alveolar volume Mild reduction in total lung capacity, airtrapping.  Chest x-ray 02/16/16-no acute cardio pulmonary abnormality. Chest x-ray 06/04/16- cardio megaly, left base atelectasis CT abdomen 06/04/16- mild left base atelectasis with scarring. No acute infiltrate or fibrosis Coronary 01/08/17- Lung images show 4 mm right middle lobe lung nodule with no acute lung infiltrate or consolidation.  I reviewed all images personally.  CBC with diff 06/04/16- WBC 13.6. Eos 2%, absolute eos count 272 Blood allergy profile- IgE 133, sensitive to weed, grass and elm.   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 Grade 1   diastolic dysfunction. Mild left atrial enlargement. Trivial   tricuspid regurgitation.  Sleep study 12/23/13 Mild sleep apnea.  Sleep study 08/05/16 Mild to moderate obstructive sleep apnea occurred during the diagnostic portion of the study (AHI = 14.4 /hour). CPAP titration was attempted but resulted in worsening apneic events. A formal CPAP/BiPAP titration study is recommended.  Titration study 11/07/16 He did not tolerate CPAP due to difficulty exhaling against the pressure. Trial of BiPAP therapy on 8/4 cm H2O with a Medium size Resmed Nasal Pillow Mask AirFit P10 mask and heated humidification  Assessment:  COPD GOLD B Review of his PFTs show obstruction with no bronchodilator response. In addition he has air trapping consistent with emphysema. There is no bronchodilator response, FENO is low  and his clinical picture is not consistent with asthma. There is mild restriction with marked reduction in ERV secondary to body habitus and obesity. There is no suspicion for interstitial lung disease on his chest imaging.  Continues on trelegy inhaler and albuterol as needed.  I will add duo nebs every 4 hours as needed. No desats on exertion As he continues to have worsening symptoms we will get a CT angiogram for evaluation of blood clots. I encouraged him to try weight loss and exercise as his weight may be playing a role in dyspnea.  We discussed pulmonary rehab but he would like to think about it before deciding to go ahead.  Ex smoker Quit 3 months ago.  I congratulated him and encouraged him to not resume smoking again  Moderate sleep apnea Tolerating BiPAP.  He will need to require mask fitting and assessment for nasal pillows as the mask is causing skin irritation.  Subcentimeter pulmonary nodule 4 mm right middle lobe nodule noted on coronary CT.  Follow-up with repeat scan in 1 year.  Plan/Recommendations: - Continue Trelegy, albuterol rescue inhaler - Weight loss and exercise.  Pulmonary rehab recommended - Continue Bipap.  Needs mask fitting - CT angio for evaluation of pulmonary embolism  Marshell Garfinkel MD [facility] Pager [phone] 03/07/2017, 11:07 AM  CC: No ref. provider found

## 2017-03-10 ENCOUNTER — Ambulatory Visit (INDEPENDENT_AMBULATORY_CARE_PROVIDER_SITE_OTHER)
Admission: RE | Admit: 2017-03-10 | Discharge: 2017-03-10 | Disposition: A | Discharge status: Home / Self Care | Payer: BLUE CROSS/BLUE SHIELD | Source: Ambulatory Visit | Attending: Pulmonary Disease | Admitting: Pulmonary Disease

## 2017-03-10 ENCOUNTER — Telehealth: Payer: Self-pay | Admitting: Pulmonary Disease

## 2017-03-10 DIAGNOSIS — I272 Pulmonary hypertension, unspecified: Secondary | ICD-10-CM

## 2017-03-10 DIAGNOSIS — R911 Solitary pulmonary nodule: Secondary | ICD-10-CM

## 2017-03-10 DIAGNOSIS — J449 Chronic obstructive pulmonary disease, unspecified: Secondary | ICD-10-CM | POA: Diagnosis not present

## 2017-03-10 IMAGING — CT CT ANGIO CHEST
2 of 7 series · 17 of 46 positions shown · IV contrast (iopamidol)
Comparison: Chest radiograph [DATE]

CLINICAL DATA: Shortness of breath and lower extremity edema

EXAM:
CT ANGIOGRAPHY CHEST WITH CONTRAST
TECHNIQUE: Multidetector CT imaging of the chest was performed using the
standard protocol during bolus administration of intravenous
contrast. Multiplanar CT image reconstructions and MIPs were
obtained to evaluate the vascular anatomy.
CONTRAST:  80mL [JI] IOPAMIDOL ([JI]) INJECTION 76%

[Series 5: thins · axial · 0.75mm/px · z∈[-335,-61]mm · 14 of 302 slices shown]
[im 14/302  lung]
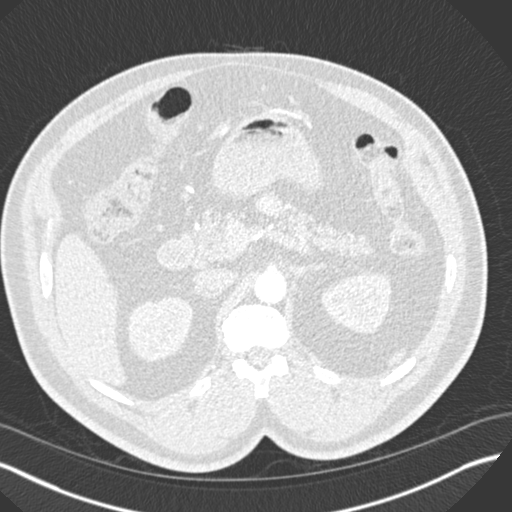
[im 40/302  soft-tissue]
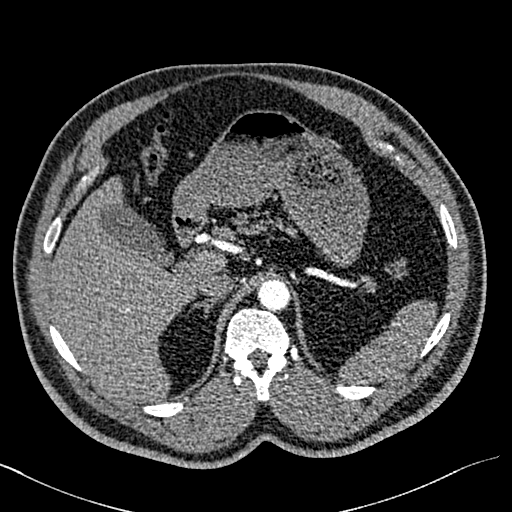
[im 53/302  lung]
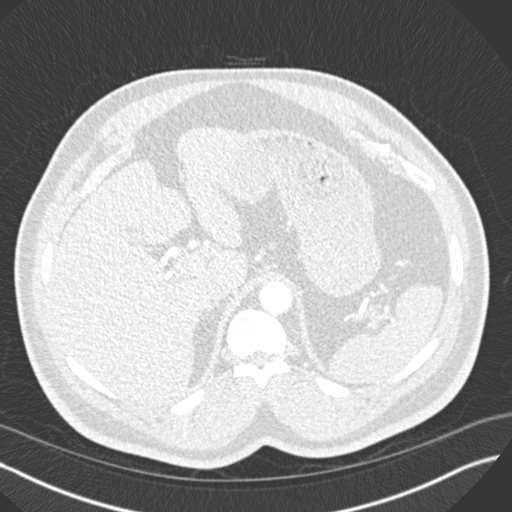
[im 79/302  soft-tissue]
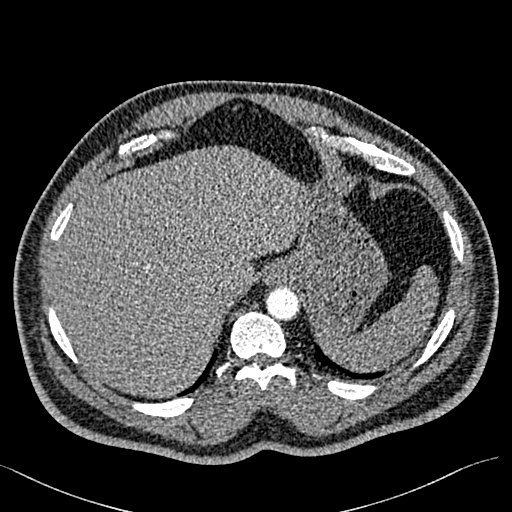
[im 105/302  lung]
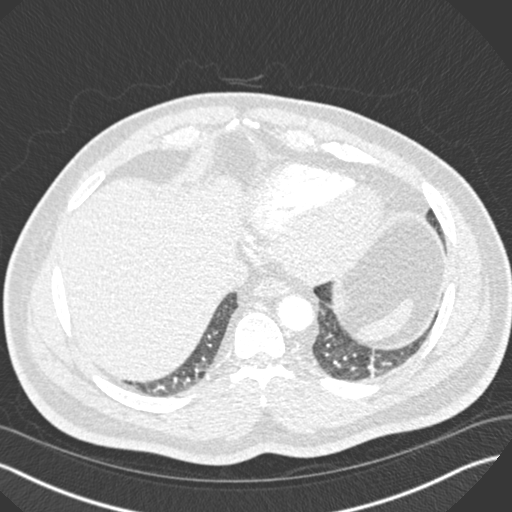
[im 118/302  soft-tissue]
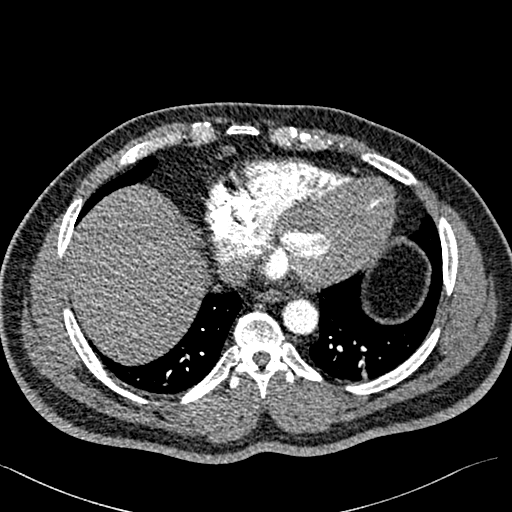
[im 144/302  lung]
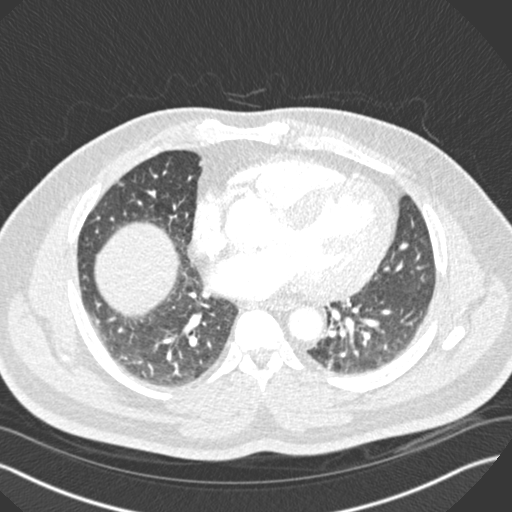
[im 158/302  soft-tissue]
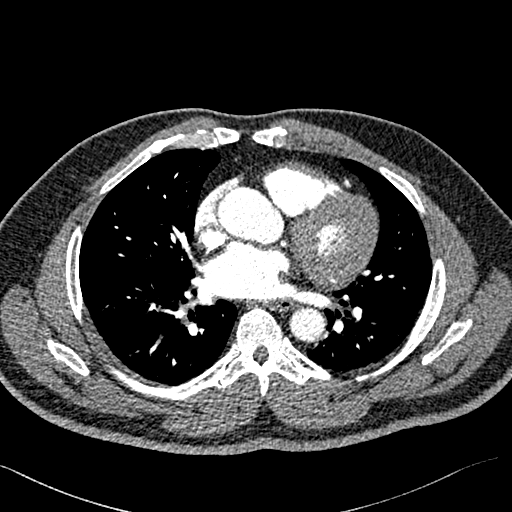
[im 184/302  lung]
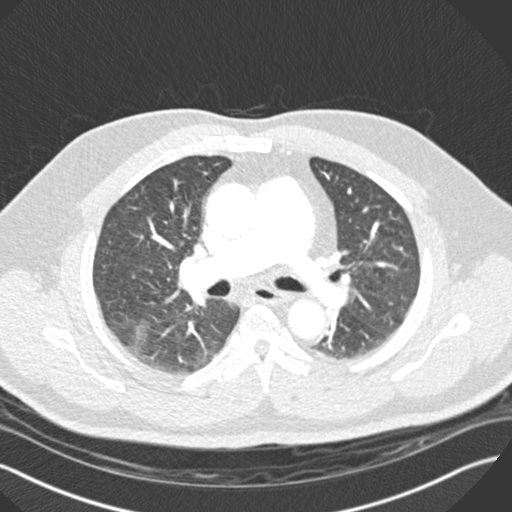
[im 197/302  soft-tissue]
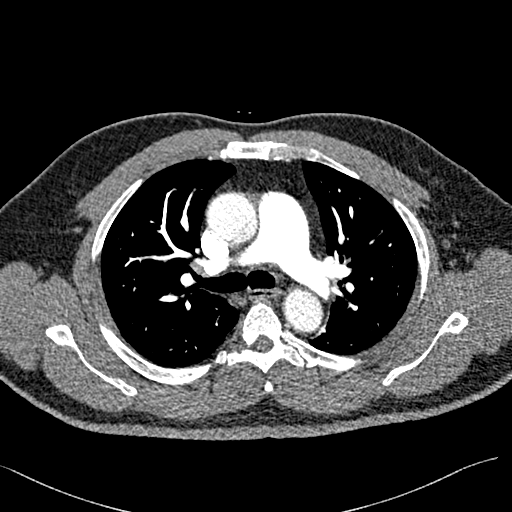
[im 223/302  lung]
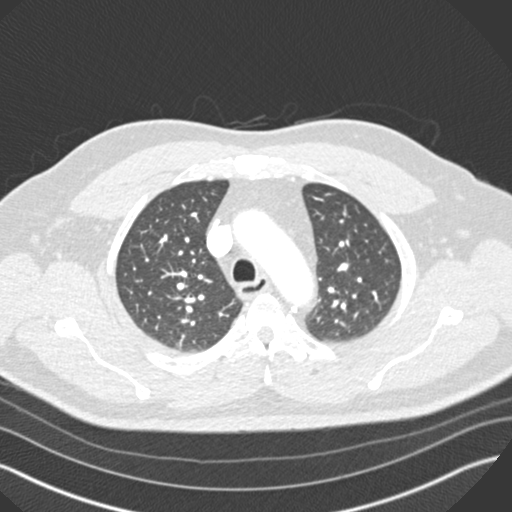
[im 249/302  soft-tissue]
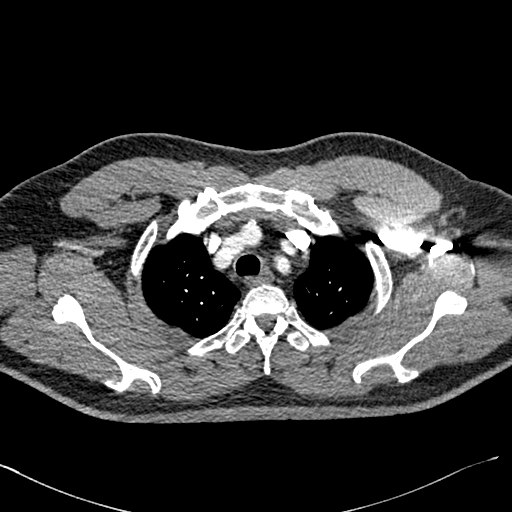
[im 262/302  lung]
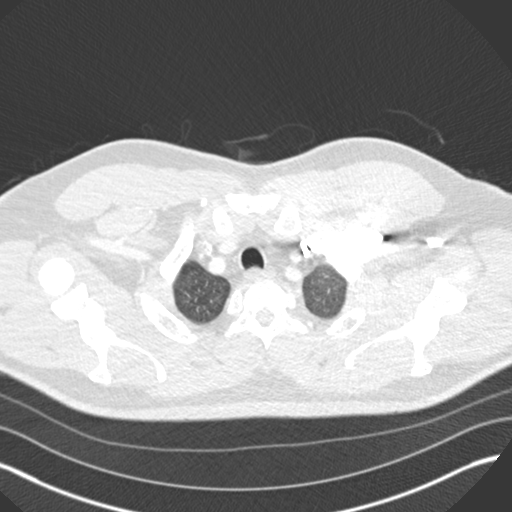
[im 288/302  soft-tissue]
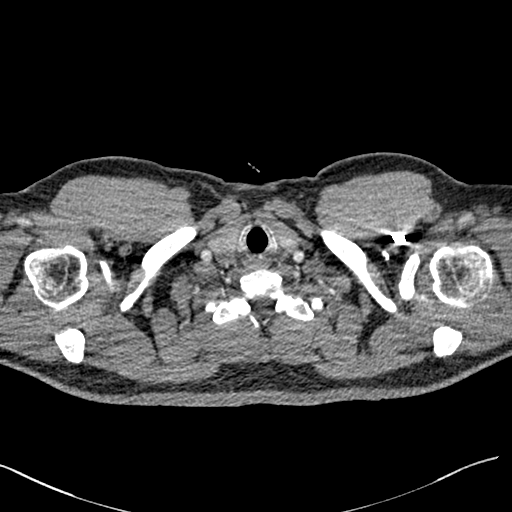

[Series 7: coronal mpr · coronal · 0.59mm/px · 3 of 125 slices shown]
[im 32/125  soft-tissue]
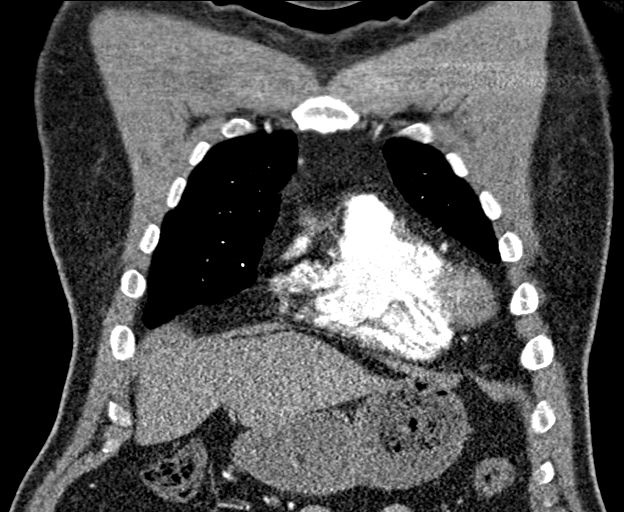
[im 63/125  soft-tissue]
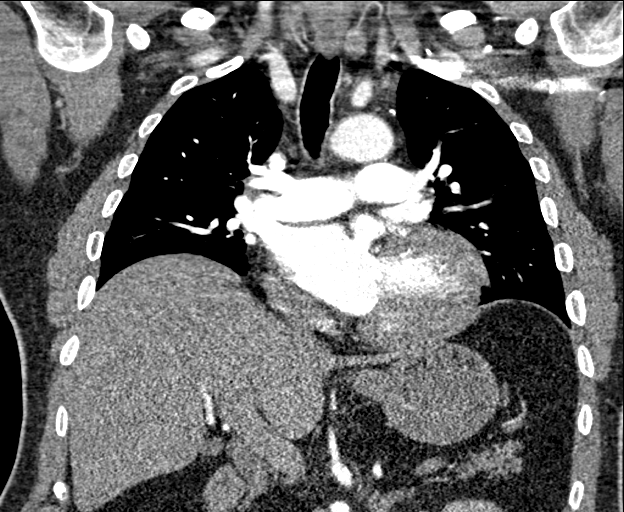
[im 94/125  soft-tissue]
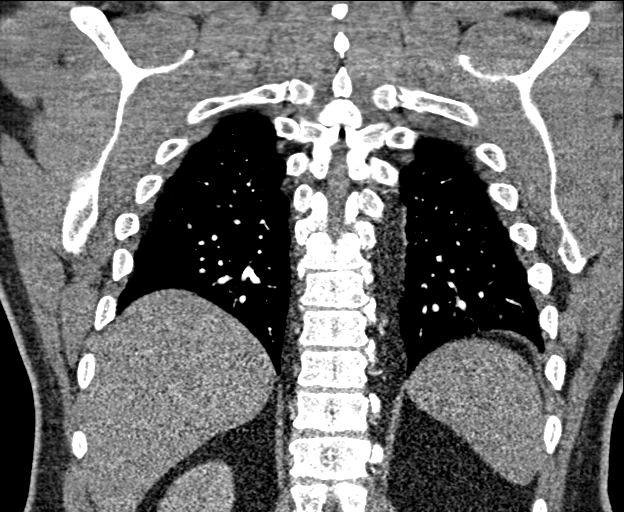

[17 of 46 positions shown; findings below may reference images not displayed]

FINDINGS: Cardiovascular: There is no evident pulmonary embolus. There is no
appreciable thoracic aortic aneurysm or dissection. There is mild
calcification in the proximal left common carotid artery. Other
visualized great vessels appear unremarkable. Note that the left and
right common carotid arteries arise as a common trunk, an anatomic
variant. There is mild atherosclerotic calcification in the aorta
there is no appreciable pericardial effusion or pericardial
thickening. There is left ventricular hypertrophy. There is no
pericardial effusion or pericardial thickening.

There is prominence of the main pulmonary outflow tract, measuring
3.8 cm in diameter, a finding felt to be indicative of a degree of
pulmonary arterial hypertension.

Mediastinum/Nodes: Visualized thyroid appears normal. There is no
appreciable thoracic adenopathy. No esophageal lesions appreciable.

Lungs/Pleura: There is mild bibasilar atelectasis. There is no edema
or consolidation. There is no pleural effusion or pleural
thickening.

On axial slice 41 series 6, there is a 4 x 3 mm nodular opacity in
the anterior segment right upper lobe. On axial slice 45 series 6,
there is a 2 mm nodular opacity in the posterior segment of the left
upper lobe. On axial slice 58 series 6, there is a 2 mm nodular
opacity in the anterior segment of the left lower lobe.

Upper Abdomen: Visualized upper abdominal structures appear normal.

Musculoskeletal: There are no blastic or lytic bone lesions.

Review of the MIP images confirms the above findings.
IMPRESSION: 1. No demonstrable pulmonary embolus. No thoracic aortic aneurysm or
dissection.

2. Prominence of the main pulmonary outflow tract, a finding felt to
be indicative of pulmonary arterial hypertension.

3. No lung edema or consolidation. Several small nodular opacities,
largest measuring 4 mm. No follow-up needed if patient is low-risk
(and has no known or suspected primary neoplasm). Non-contrast chest
CT can be considered in 12 months if patient is high-risk. This
recommendation follows the consensus statement: Guidelines for
Management of Incidental Pulmonary Nodules Detected on CT Images:

4.  No evident adenopathy.

5. Mild aortic and great vessel atherosclerosis. There is left
ventricular hypertrophy.

Aortic Atherosclerosis ([JI]-[JI]).

## 2017-03-10 MED ORDER — IOPAMIDOL (ISOVUE-370) INJECTION 76%
80.0000 mL | Freq: Once | INTRAVENOUS | Status: AC | PRN
  Administered 2017-03-10: 80 mL via INTRAVENOUS

## 2017-03-10 NOTE — Telephone Encounter (Signed)
He just had CT Angio today. PM please advise on results. Thanks

## 2017-03-10 NOTE — Telephone Encounter (Signed)
See result note.  

## 2017-03-10 NOTE — Telephone Encounter (Signed)
Notes recorded by Marshell Garfinkel, MD on 03/10/2017 at 5:17 PM EST There is no PE. There are small lung nodules that can be followed up with repeat CT without contrast in 1 year. Please order There is also suggestion of pulmonary HTN. Please order echocardiogram. Thanks  Advised pt of results. Pt understood and nothing further is needed.   Order placed.

## 2017-03-10 NOTE — Telephone Encounter (Signed)
Notes recorded by Marshell Garfinkel, MD on 03/10/2017 at 5:17 PM EST There is no PE. There are small lung nodules that can be followed up with repeat CT without contrast in 1 year. Please order There is also suggestion of pulmonary HTN. Please order echocardiogram. Thanks -------

## 2017-03-17 ENCOUNTER — Ambulatory Visit (HOSPITAL_COMMUNITY): Payer: BLUE CROSS/BLUE SHIELD | Attending: Internal Medicine

## 2017-03-17 ENCOUNTER — Other Ambulatory Visit: Payer: Self-pay

## 2017-03-17 ENCOUNTER — Other Ambulatory Visit: Payer: Self-pay | Admitting: Pulmonary Disease

## 2017-03-17 DIAGNOSIS — I272 Pulmonary hypertension, unspecified: Secondary | ICD-10-CM | POA: Diagnosis present

## 2017-03-17 DIAGNOSIS — I5030 Unspecified diastolic (congestive) heart failure: Secondary | ICD-10-CM | POA: Diagnosis not present

## 2017-03-26 ENCOUNTER — Other Ambulatory Visit (HOSPITAL_BASED_OUTPATIENT_CLINIC_OR_DEPARTMENT_OTHER): Payer: BLUE CROSS/BLUE SHIELD

## 2017-03-28 ENCOUNTER — Ambulatory Visit (INDEPENDENT_AMBULATORY_CARE_PROVIDER_SITE_OTHER): Payer: BLUE CROSS/BLUE SHIELD | Admitting: Pulmonary Disease

## 2017-03-28 ENCOUNTER — Encounter: Payer: Self-pay | Admitting: Pulmonary Disease

## 2017-03-28 VITALS — BP 140/82 | HR 73 | Ht 68.0 in | Wt 223.8 lb

## 2017-03-28 DIAGNOSIS — G4733 Obstructive sleep apnea (adult) (pediatric): Secondary | ICD-10-CM | POA: Diagnosis not present

## 2017-03-28 DIAGNOSIS — R0602 Shortness of breath: Secondary | ICD-10-CM | POA: Diagnosis not present

## 2017-03-28 DIAGNOSIS — J449 Chronic obstructive pulmonary disease, unspecified: Secondary | ICD-10-CM

## 2017-03-28 NOTE — Patient Instructions (Signed)
We will schedule you for a cardiopulmonary exercise test for evaluation of dyspnea Continue the inhalers and Bipap  We will make sure that you get nebulizer machine Please work on weight loss and exercise.  Will refer you to cardiopulmonary rehab program Follow-up in 3 months.

## 2017-03-28 NOTE — Progress Notes (Signed)
DEVINE KLINGEL    [id number]    10-Mar-1965  Primary Care Physician:Default, Provider, MD  Referring Physician: No referring provider defined for this encounter.  Chief complaint:  Follow up for  COPD GOLD B (CAT score 29, 0 exacerbations)  HPI: Mr. Romanoski is 52 year old with past medical history of childhood asthma, hypertension, active smoker. He was diagnosed with asthma as a child which got better in adulthood. 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 He reports mainly dyspnea on exertion and does not have dyspnea on rest, wheezing. He has daily symptoms of cough with white sputum production.nHe does not have seasonal allergies, sensitivities to cats, dogs, strong perfumes, no heartburn symptoms. Evaluated by cardiology and had a CT coronary done which showed intermediate risk for ischemia.  Lung images shows a 4 mm nodule with no other acute lung abnormality.  He has over 38-pack-year smoking history. Quit in Nov 2018. 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  Currently disabled due to dyspnea.  Interim history: Continues on trelegy inhaler.  Still has dyspnea on exertion which is unchanged.  Denies any cough, sputum production, wheezing. He had a CT angiogram and echo done for evaluation of dyspnea.  Continues on BiPAP.  Interface switched to nasal pillow as he had some skin breakdown with a full facemask.  He is tolerating this well.  Outpatient Encounter Medications as of 03/28/2017  Medication Sig  . amLODipine (NORVASC) 10 MG tablet Take 1 tablet (10 mg total) by mouth daily.  Marland Kitchen aspirin EC 81 MG tablet Take 1 tablet (81 mg total) by mouth daily.  . chlorthalidone (HYGROTON) 25 MG tablet Take 1 tablet (25 mg total) by mouth daily.  . hydrALAZINE (APRESOLINE) 25 MG tablet Take 25 mg by mouth daily.  Marland Kitchen ipratropium-albuterol (DUONEB) 0.5-2.5 (3)  MG/3ML SOLN Take 3 mLs by nebulization every 4 (four) hours as needed.  . isosorbide mononitrate (IMDUR) 60 MG 24 hr tablet Take 1 tablet (60 mg total) by mouth daily.  Marland Kitchen losartan (COZAAR) 25 MG tablet Take 25 mg by mouth daily.  . metFORMIN (GLUCOPHAGE) 500 MG tablet Take 1 tablet (500 mg total) by mouth daily with breakfast.  . metoprolol tartrate (LOPRESSOR) 50 MG tablet Take 1 tablet by mouth ONE HOUR prior to your CT test.  . nitroGLYCERIN (NITROSTAT) 0.4 MG SL tablet Place 1 tablet (0.4 mg total) under the tongue every 5 (five) minutes as needed for chest pain.  . Tiotropium Bromide-Olodaterol (STIOLTO RESPIMAT) 2.5-2.5 MCG/ACT AERS Inhale 2 puffs into the lungs daily.  . VENTOLIN HFA 108 (90 Base) MCG/ACT inhaler INHALE 2 PUFFS BY MOUTH EVERY 6 HOURS AS NEEDED FOR WHEEZING OR SHORTNESS OF BREATH   No facility-administered encounter medications on file as of 03/28/2017.     Allergies as of 03/28/2017 - Review Complete 03/28/2017  Allergen Reaction Noted  . Dust mite extract  10/13/2013  . Lisinopril Cough 06/07/2015    Past Medical History:  Diagnosis Date  . Asthma   . Bronchitis   . Diabetes mellitus without complication ([place])   . Hypertension     Past Surgical History:  Procedure Laterality Date  . COLONOSCOPY N/A 09/18/2016   Procedure: COLONOSCOPY;  Surgeon: Danie Binder, MD;  Location: [facility];  Service: Endoscopy;  Laterality: N/A;  10:30 AM  .  lipoma removal      Family History  Problem Relation Age of Onset  . Stroke Mother   . Heart attack Father 21       CABG  . Hypertension Sister   . Stroke Brother   . Hypertension Brother   . Stomach cancer Brother   . Stroke Maternal Grandmother   . Heart attack Maternal Grandfather   . Heart attack Paternal Grandmother     Social History   Socioeconomic History  . Marital status: Married    Spouse name: Not on file  . Number of children: Not on file  . Years of education: Not on file  . Highest  education level: Not on file  Social Needs  . Financial resource strain: Not on file  . Food insecurity - worry: Not on file  . Food insecurity - inability: Not on file  . Transportation needs - medical: Not on file  . Transportation needs - non-medical: Not on file  Occupational History  . Not on file  Tobacco Use  . Smoking status: Current Some Day Smoker    Packs/day: 0.25    Years: 25.00    Pack years: 6.25    Types: Cigarettes  . Smokeless tobacco: Former Systems developer    Quit date: 07/11/2015  . Tobacco comment: a pack will last a week-03/28/17  Substance and Sexual Activity  . Alcohol use: Yes    Alcohol/week: 0.0 oz    Comment: occasionally  . Drug use: No  . Sexual activity: Yes    Birth control/protection: None  Other Topics Concern  . Not on file  Social History Narrative  . Not on file   Review of systems: Review of Systems  Constitutional: Negative for fever and chills.  HENT: Negative.   Eyes: Negative for blurred vision.  Respiratory: as per HPI  Cardiovascular: Negative for chest pain and palpitations.  Gastrointestinal: Negative for vomiting, diarrhea, blood per rectum. Genitourinary: Negative for dysuria, urgency, frequency and hematuria.  Musculoskeletal: Negative for myalgias, back pain and joint pain.  Skin: Negative for itching and rash.  Neurological: Negative for dizziness, tremors, focal weakness, seizures and loss of consciousness.  Endo/Heme/Allergies: Negative for environmental allergies.  Psychiatric/Behavioral: Negative for depression, suicidal ideas and hallucinations.  All other systems reviewed and are negative.  Physical Exam: Blood pressure 140/82, pulse 73, height 5\' 8"  (1.727 m), weight 223 lb 12.8 oz (101.5 kg), SpO2 93 %. Gen:      No acute distress HEENT:  EOMI, sclera anicteric Neck:     No masses; no thyromegaly Lungs:    Clear to auscultation bilaterally; normal respiratory effort CV:         Regular rate and rhythm; no murmurs Abd:       + bowel sounds; soft, non-tender; no palpable masses, no distension Ext:    No edema; adequate peripheral perfusion Skin:      Warm and dry; no rash Neuro: alert and oriented x 3 Psych: normal mood and affect  Data Reviewed: FENO 05/29/16- 12 FENO 07/30/16- 9  PFTs 04/24/16 FVC 2.01 (62%), FEV1 1.65 (53%), F/F 69, TLC 75%, RV/TLC 130% DLCO 65%, DLCO/VA 102% Moderate to severe obstructive airway disease with no bronchodilator response. Moderate diffusion impairment which corrects for alveolar volume Mild reduction in total lung capacity, airtrapping.  Imaging Chest x-ray 02/16/16-no acute cardio pulmonary abnormality. Chest x-ray 06/04/16- cardio megaly, left base atelectasis CT abdomen 06/04/16- mild left base atelectasis with scarring. No acute infiltrate or fibrosis Coronary 01/08/17- Lung  images show 4 mm right middle lobe lung nodule with no acute lung infiltrate or consolidation. CT angiogram 03/10/17-no pulmonary embolism, prominence of main pulmonary outflow tract.  Subcentimeter pulmonary nodule.  The largest measuring 4 mm.  LVH. I reviewed all images personally.  CBC with diff 06/04/16- WBC 13.6. Eos 2%, absolute eos count 272 Blood allergy profile- IgE 133, sensitive to weed, grass and elm.   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 Grade 1   diastolic dysfunction. Mild left atrial enlargement. Trivial   tricuspid regurgitation.  Echo 03/17/17 LV cavity is vigorous with near cavity obliteration, LVOT is narrow.  Moderate LVH, grade 1 diastolic dysfunction  Stress test 03/25/16- intermediate risk study, nuclear stress to EF 38%.  Significant hypertensive response to exercise  Sleep eval Sleep study 12/23/13 Mild sleep apnea.  Sleep study 08/05/16 Mild to moderate obstructive sleep apnea occurred during the diagnostic portion of the study (AHI = 14.4 /hour). CPAP titration was  attempted but resulted in worsening apneic events. A formal CPAP/BiPAP titration study is recommended.  Titration study 11/07/16 He did not tolerate CPAP due to difficulty exhaling against the pressure. Trial of BiPAP therapy on 8/4 cm H2O with a Medium size Resmed Nasal Pillow Mask AirFit P10 mask and heated humidification  Assessment:  COPD GOLD B Review of his PFTs shows obstruction with no bronchodilator response. In addition he has air trapping consistent with emphysema. There is no bronchodilator response, FENO is low and his clinical picture is not consistent with asthma. There is mild restriction with marked reduction in ERV secondary to body habitus and obesity. No interstitial or PE on his chest imaging Continues on trelegy inhaler and albuterol as needed. Continue duonebs as needed. No desats on exertion.   Suspect his dyspnea is multifactorial from COPD, diastolic dysfunction, deconditioning, obesity. I encouraged him to try weight loss and exercise. Refer for cardiopulmonary exercise test Start pulmonary rehabilitation  Moderate sleep apnea Tolerating BiPAP.  Mask interface switched from full face to nasal pillows.  He is tolerating it well.  Subcentimeter pulmonary nodule  Follow-up with repeat scan in 1 year.  Plan/Recommendations: - Continue Trelegy, albuterol rescue inhaler - Weight loss and exercise.  Pulmonary rehab - Continue Bipap.  - CPST  Marshell Garfinkel MD [facility] Pager [phone] 03/28/2017, 10:32 AM  CC: No ref. provider found

## 2017-04-04 ENCOUNTER — Telehealth (HOSPITAL_COMMUNITY): Payer: Self-pay | Admitting: *Deleted

## 2017-04-04 NOTE — Telephone Encounter (Signed)
Patient contacted for reminder of CPX appointment Monday 04/07/17 at 0900 with brief overview of instructions, particularly diabetic related. All patient's questions answered and patient plans to arrive at the main entrance to admitting.   Landis Martins, MS, ACSM-RCEP

## 2017-04-07 ENCOUNTER — Ambulatory Visit (HOSPITAL_COMMUNITY): Payer: BLUE CROSS/BLUE SHIELD | Attending: Pulmonary Disease

## 2017-04-07 DIAGNOSIS — R0602 Shortness of breath: Secondary | ICD-10-CM | POA: Diagnosis not present

## 2017-04-13 DIAGNOSIS — R0602 Shortness of breath: Secondary | ICD-10-CM | POA: Diagnosis not present

## 2017-04-14 NOTE — Progress Notes (Signed)
Was able to talk to the patient regarding their results.  They verbalized an understanding of what was discussed. No further questions at this time. 

## 2017-04-16 DIAGNOSIS — Z029 Encounter for administrative examinations, unspecified: Secondary | ICD-10-CM

## 2017-04-21 ENCOUNTER — Encounter (HOSPITAL_COMMUNITY): Payer: Self-pay | Admitting: Emergency Medicine

## 2017-04-21 ENCOUNTER — Emergency Department (HOSPITAL_COMMUNITY)
Admission: EM | Admit: 2017-04-21 | Discharge: 2017-04-22 | Disposition: A | Discharge status: Home / Self Care | Payer: BLUE CROSS/BLUE SHIELD | Attending: Emergency Medicine | Admitting: Emergency Medicine

## 2017-04-21 ENCOUNTER — Other Ambulatory Visit: Payer: Self-pay

## 2017-04-21 ENCOUNTER — Emergency Department (HOSPITAL_COMMUNITY): Payer: BLUE CROSS/BLUE SHIELD

## 2017-04-21 DIAGNOSIS — J441 Chronic obstructive pulmonary disease with (acute) exacerbation: Secondary | ICD-10-CM | POA: Diagnosis not present

## 2017-04-21 DIAGNOSIS — Z7984 Long term (current) use of oral hypoglycemic drugs: Secondary | ICD-10-CM | POA: Diagnosis not present

## 2017-04-21 DIAGNOSIS — I5032 Chronic diastolic (congestive) heart failure: Secondary | ICD-10-CM | POA: Diagnosis not present

## 2017-04-21 DIAGNOSIS — R0602 Shortness of breath: Secondary | ICD-10-CM | POA: Diagnosis present

## 2017-04-21 DIAGNOSIS — F17210 Nicotine dependence, cigarettes, uncomplicated: Secondary | ICD-10-CM | POA: Diagnosis not present

## 2017-04-21 DIAGNOSIS — J111 Influenza due to unidentified influenza virus with other respiratory manifestations: Secondary | ICD-10-CM | POA: Diagnosis not present

## 2017-04-21 DIAGNOSIS — R0789 Other chest pain: Secondary | ICD-10-CM | POA: Insufficient documentation

## 2017-04-21 DIAGNOSIS — N183 Chronic kidney disease, stage 3 (moderate): Secondary | ICD-10-CM | POA: Insufficient documentation

## 2017-04-21 DIAGNOSIS — Z7982 Long term (current) use of aspirin: Secondary | ICD-10-CM | POA: Insufficient documentation

## 2017-04-21 DIAGNOSIS — E1122 Type 2 diabetes mellitus with diabetic chronic kidney disease: Secondary | ICD-10-CM | POA: Diagnosis not present

## 2017-04-21 DIAGNOSIS — R69 Illness, unspecified: Secondary | ICD-10-CM

## 2017-04-21 DIAGNOSIS — Z79899 Other long term (current) drug therapy: Secondary | ICD-10-CM | POA: Diagnosis not present

## 2017-04-21 DIAGNOSIS — I130 Hypertensive heart and chronic kidney disease with heart failure and stage 1 through stage 4 chronic kidney disease, or unspecified chronic kidney disease: Secondary | ICD-10-CM | POA: Insufficient documentation

## 2017-04-21 HISTORY — DX: Chronic obstructive pulmonary disease, unspecified: J44.9

## 2017-04-21 LAB — COMPREHENSIVE METABOLIC PANEL
ALBUMIN: 4.1 g/dL (ref 3.5–5.0)
ALK PHOS: 82 U/L (ref 38–126)
ALT: 20 U/L (ref 17–63)
AST: 20 U/L (ref 15–41)
Anion gap: 10 (ref 5–15)
BILIRUBIN TOTAL: 0.4 mg/dL (ref 0.3–1.2)
BUN: 19 mg/dL (ref 6–20)
CALCIUM: 9.2 mg/dL (ref 8.9–10.3)
CO2: 25 mmol/L (ref 22–32)
Chloride: 106 mmol/L (ref 101–111)
Creatinine, Ser: 1.69 mg/dL — ABNORMAL HIGH (ref 0.61–1.24)
GFR calc Af Amer: 52 mL/min — ABNORMAL LOW (ref >60)
GFR calc non Af Amer: 45 mL/min — ABNORMAL LOW (ref >60)
GLUCOSE: 165 mg/dL — AB (ref 65–99)
Potassium: 3.5 mmol/L (ref 3.5–5.1)
SODIUM: 141 mmol/L (ref 135–145)
TOTAL PROTEIN: 7.2 g/dL (ref 6.5–8.1)

## 2017-04-21 LAB — CBC WITH DIFFERENTIAL/PLATELET
BASOS ABS: 0 10*3/uL (ref 0.0–0.1)
BASOS PCT: 0 %
Eosinophils Absolute: 0.2 10*3/uL (ref 0.0–0.7)
Eosinophils Relative: 2 %
HEMATOCRIT: 43.6 % (ref 39.0–52.0)
HEMOGLOBIN: 14.3 g/dL (ref 13.0–17.0)
Lymphocytes Relative: 14 %
Lymphs Abs: 1.4 10*3/uL (ref 0.7–4.0)
MCH: 26 pg (ref 26.0–34.0)
MCHC: 32.8 g/dL (ref 30.0–36.0)
MCV: 79.1 fL (ref 78.0–100.0)
MONOS PCT: 7 %
Monocytes Absolute: 0.8 10*3/uL (ref 0.1–1.0)
NEUTROS ABS: 7.9 10*3/uL — AB (ref 1.7–7.7)
NEUTROS PCT: 77 %
Platelets: 230 10*3/uL (ref 150–400)
RBC: 5.51 MIL/uL (ref 4.22–5.81)
RDW: 14.4 % (ref 11.5–15.5)
WBC: 10.3 10*3/uL (ref 4.0–10.5)

## 2017-04-21 LAB — TROPONIN I
Troponin I: <0.03 ng/mL (ref <0.03)
Troponin I: <0.03 ng/mL (ref <0.03)

## 2017-04-21 LAB — CBG MONITORING, ED: Glucose-Capillary: 152 mg/dL — ABNORMAL HIGH (ref 65–99)

## 2017-04-21 IMAGING — DX DG CHEST 2V
2 series · 2 of 2 positions shown · non-contrast
Comparison: [DATE]

CLINICAL DATA: Shortness of breath, chest pain and cough for 2
days.

EXAM:
CHEST  2 VIEW

[chest pa]
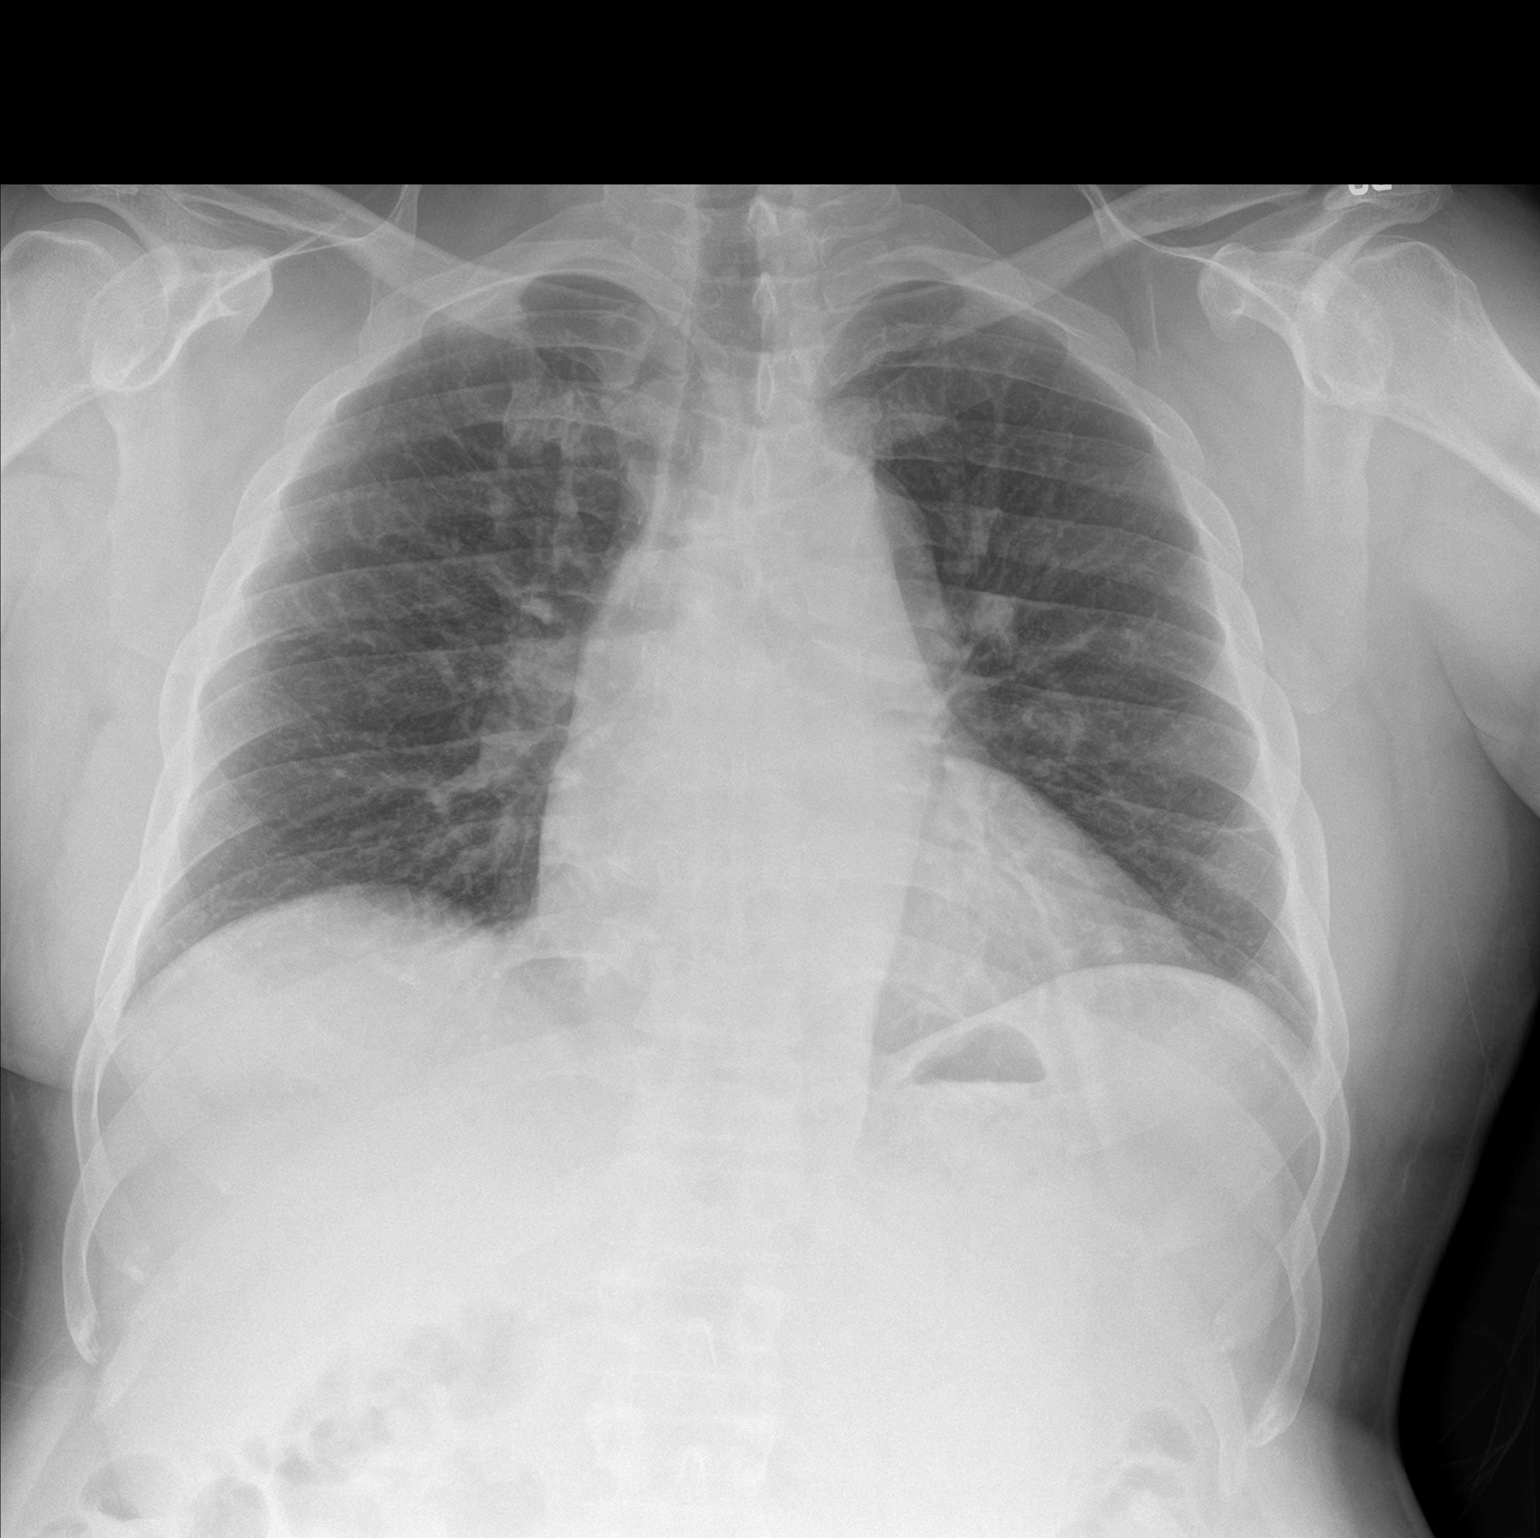

[chest lat]
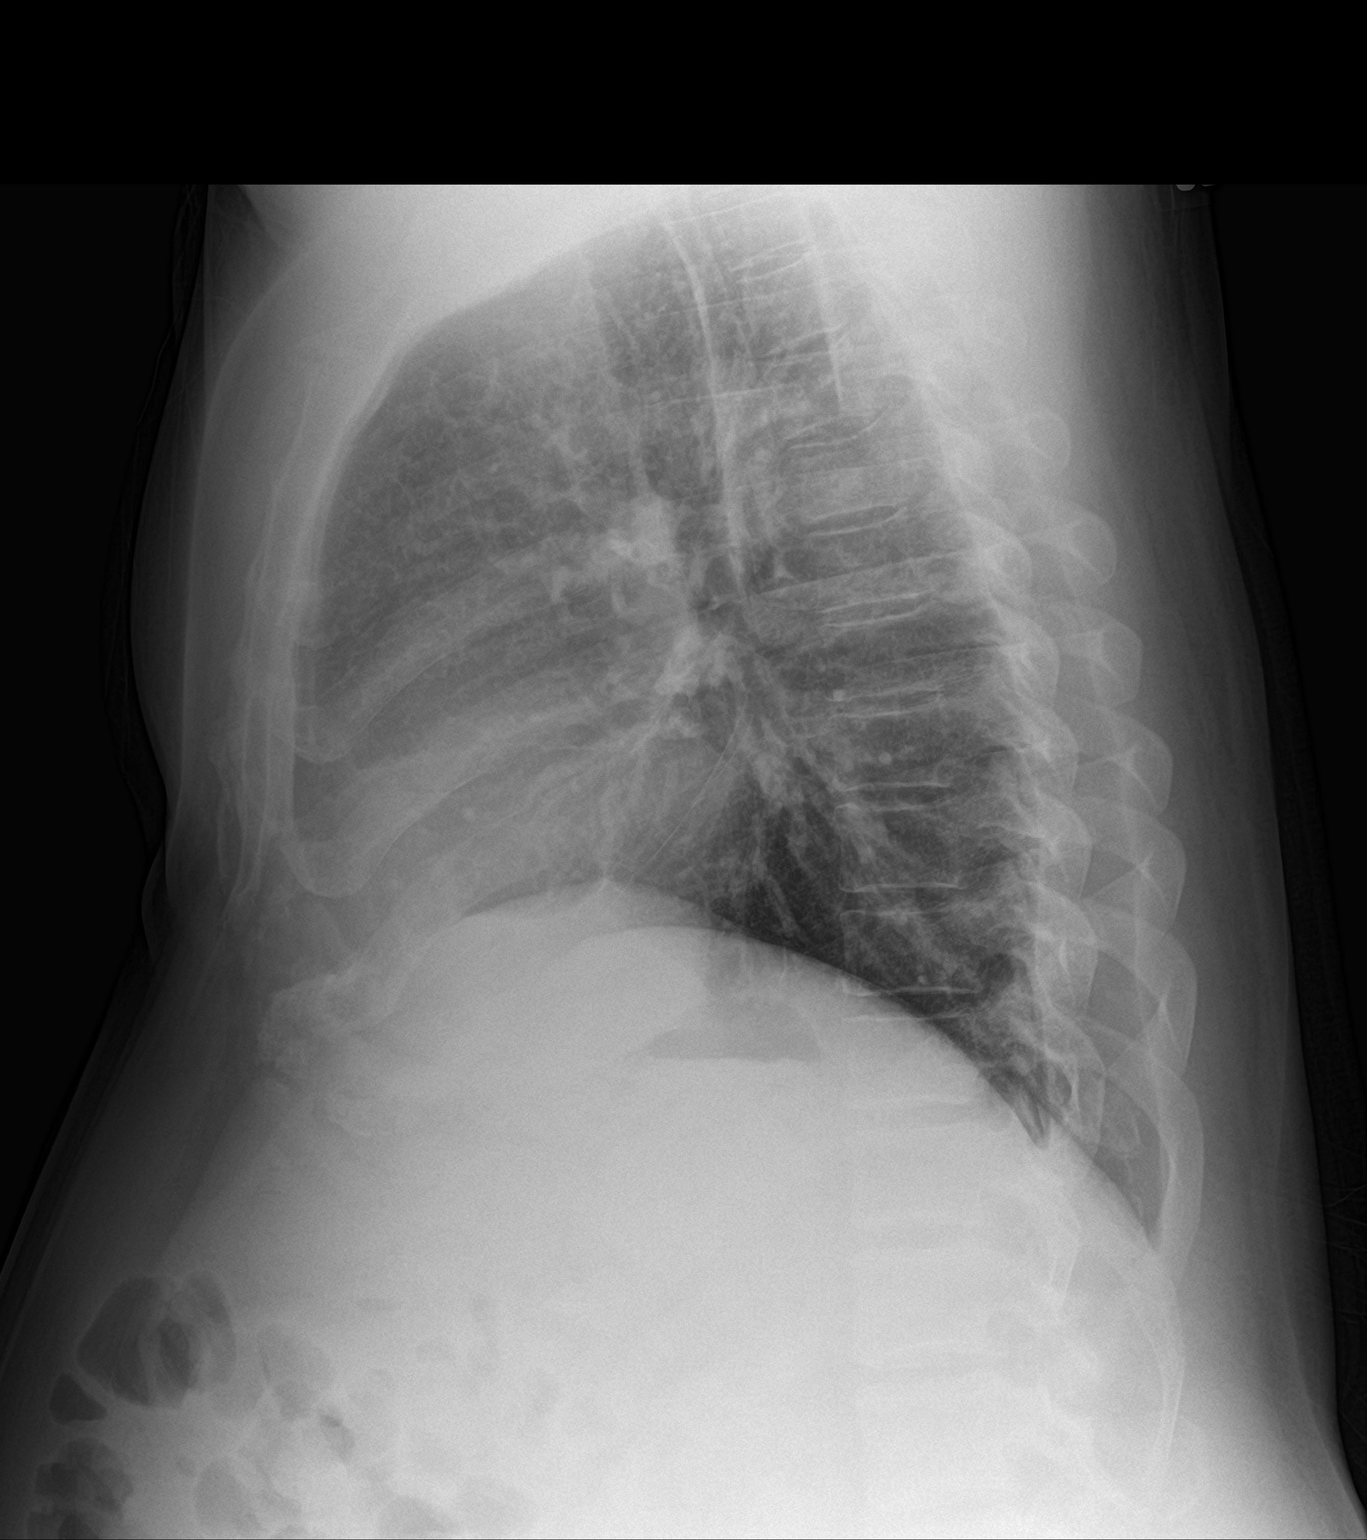

[2 of 2 positions shown; findings below may reference images not displayed]

FINDINGS: The heart size and mediastinal contours are within normal limits.
There is no focal infiltrate, pulmonary edema, or pleural effusion.
The visualized skeletal structures are unremarkable.
IMPRESSION: No active cardiopulmonary disease.

## 2017-04-21 MED ORDER — AEROCHAMBER PLUS W/MASK MISC
1.0000 | Freq: Once | Status: AC
  Administered 2017-04-21: 1
  Filled 2017-04-21: qty 1

## 2017-04-21 MED ORDER — IPRATROPIUM-ALBUTEROL 0.5-2.5 (3) MG/3ML IN SOLN
3.0000 mL | Freq: Once | RESPIRATORY_TRACT | Status: AC
  Administered 2017-04-21: 3 mL via RESPIRATORY_TRACT
  Filled 2017-04-21: qty 3

## 2017-04-21 MED ORDER — ALBUTEROL SULFATE (2.5 MG/3ML) 0.083% IN NEBU: 5.0000 mg | INHALATION_SOLUTION | Freq: Once | RESPIRATORY_TRACT | Status: DC

## 2017-04-21 NOTE — ED Notes (Signed)
EKG given to Dr. J 

## 2017-04-21 NOTE — ED Provider Notes (Signed)
Planes of cough productive of white sputum, sore throat and chest pain worse with coughing for the past 2 weeks.  He is been treating himself with his albuterol inhaler, without relief.  Patient continues to smoke.  Symptoms feel like bronchitis or COPD he has had in the past.  No other associated symptoms.  Uncertain if he has had a fever.  On exam alert no respiratory distress, speaks in paragraphs, coughing occasionally HEENT exam oropharynx minimally reddened lungs clear to auscultation, coughing occasionally abdomen obese, normal active bowel sounds nontender extremities without edema skin warm dry .  I counseled patient for 5 minutes on smoking cessation. Renal insufficiency is chronic Chest x-ray viewed by me ED ECG REPORT   Date: 04/21/2017  Rate: 95  Rhythm: normal sinus rhythm  QRS Axis: left  Intervals: normal  ST/T Wave abnormalities: nonspecific T wave changes  Conduction Disutrbances:none  Narrative Interpretation:   Old EKG Reviewed: unchanged Unchanged from 11/22/2016 I have personally reviewed the EKG tracing and agree with the computerized printout as noted.   Orlie Dakin, MD 04/21/17 [phone]

## 2017-04-21 NOTE — ED Triage Notes (Addendum)
Pt c/o cough, SOB, chest tightness, and sore throat x 1 week. Hx of asthma with no relief from inhaler.

## 2017-04-21 NOTE — ED Provider Notes (Signed)
[facility] EMERGENCY DEPARTMENT Provider Note   CSN: [id number] Arrival date & time: 04/21/17  1805     History   Chief Complaint Chief Complaint  Patient presents with  . Shortness of Breath    HPI Craig Knight is a 52 y.o. male with a history of COPD, sleep apnea, diabetes, hypertension and chronic kidney disease presenting with acute on chronic shortness of breath and cough which is sometimes productive of white to yellow sputum.  He denies fevers or chills, but does endorse generalized body aches and has been exposed to his 43-year-old who was diagnosed with influenza here 3 days ago.  He reports chest tightness with sharp stabbing pain with cough in association with mild sore throat.  He is using his inhalers including his rescue albuterol inhaler, last use 2 hours prior to arrival, which was not effective, however he also denies obvious wheezing.  He denies pain or swelling in extremities.  He is symptom-free at rest.  He is followed by Dr. Vaughan Browner of pulmonology and underwent cardiopulmonary exercise testing on February 11 due to his dyspnea which does not respond to medications.  This test concluded his symptoms are from a combination of lung and heart impairment and also from being overweight and deconditioned.  He was scheduled for pulmonary rehab, but patient canceled as he states he cannot afford this treatment.  He has found no alleviators except for rest.  The history is provided by the patient.    Past Medical History:  Diagnosis Date  . Asthma   . Bronchitis   . COPD (chronic obstructive pulmonary disease) ([place])   . Diabetes mellitus without complication ([place])   . Hypertension     Patient Active Problem List   Diagnosis Date Noted  . Chest pressure 11/22/2016  . Shortness of breath 11/22/2016  . Depression, major, single episode, moderate ([place]) 10/14/2016  . Special screening for malignant neoplasms, colon   . Chronic obstructive pulmonary disease ([place])  06/08/2016  . Hypertensive emergency 02/16/2016  . Hypertensive urgency 06/07/2015  . CKD (chronic kidney disease), stage III ([place]) 06/07/2015  . Pain in the chest   . Elevated troponin   . Chronic diastolic CHF (congestive heart failure) ([place]) 09/17/2013  . DM type 2 (diabetes mellitus, type 2) ([place]) 09/15/2013  . Chest pain 08/01/2011  . HTN (hypertension) 08/01/2011  . Chest pain 07/22/2011  . Renal insufficiency 07/22/2011  . Tobacco abuse 07/22/2011  . Hypertension     Past Surgical History:  Procedure Laterality Date  . COLONOSCOPY N/A 09/18/2016   Procedure: COLONOSCOPY;  Surgeon: Danie Binder, MD;  Location: [facility] ENDO SUITE;  Service: Endoscopy;  Laterality: N/A;  10:30 AM  . lipoma removal         Home Medications    Prior to Admission medications   Medication Sig Start Date End Date Taking? Authorizing Provider  amLODipine (NORVASC) 10 MG tablet Take 1 tablet (10 mg total) by mouth daily. 03/05/16  Yes Lendon Colonel, NP  aspirin EC 81 MG tablet Take 1 tablet (81 mg total) by mouth daily. 10/13/13  Yes Branch, Alphonse Guild, MD  chlorthalidone (HYGROTON) 25 MG tablet Take 1 tablet (25 mg total) by mouth daily. 03/05/16  Yes Lendon Colonel, NP  Fluticasone-Umeclidin-Vilant (TRELEGY ELLIPTA) 100-62.5-25 MCG/INH AEPB Inhale 2 sprays into the lungs every morning.   Yes [provider]  hydrALAZINE (APRESOLINE) 25 MG tablet Take 25 mg by mouth daily.   Yes [provider]  ipratropium-albuterol (  DUONEB) 0.5-2.5 (3) MG/3ML SOLN Take 3 mLs by nebulization every 4 (four) hours as needed. Patient taking differently: Take 3 mLs by nebulization every 4 (four) hours as needed (for shortness of breath).  03/07/17  Yes Mannam, Praveen, MD  isosorbide mononitrate (IMDUR) 60 MG 24 hr tablet Take 1 tablet (60 mg total) by mouth daily. 07/03/16  Yes Mikey Kirschner, MD  losartan (COZAAR) 25 MG tablet Take 25 mg by mouth daily.   Yes [provider]    metFORMIN (GLUCOPHAGE) 500 MG tablet Take 1 tablet (500 mg total) by mouth daily with breakfast. 07/03/16  Yes Mikey Kirschner, MD  nitroGLYCERIN (NITROSTAT) 0.4 MG SL tablet Place 1 tablet (0.4 mg total) under the tongue every 5 (five) minutes as needed for chest pain. 02/28/16  Yes Mikey Kirschner, MD  Tiotropium Bromide-Olodaterol (STIOLTO RESPIMAT) 2.5-2.5 MCG/ACT AERS Inhale 2 puffs into the lungs daily. 05/29/16  Yes Mannam, Praveen, MD  VENTOLIN HFA 108 (90 Base) MCG/ACT inhaler INHALE 2 PUFFS BY MOUTH EVERY 6 HOURS AS NEEDED FOR WHEEZING OR SHORTNESS OF BREATH 03/18/17   Marshell Garfinkel, MD    Family History Family History  Problem Relation Age of Onset  . Stroke Mother   . Heart attack Father 11       CABG  . Hypertension Sister   . Stroke Brother   . Hypertension Brother   . Stomach cancer Brother   . Stroke Maternal Grandmother   . Heart attack Maternal Grandfather   . Heart attack Paternal Grandmother     Social History Social History   Tobacco Use  . Smoking status: Current Some Day Smoker    Packs/day: 0.25    Years: 25.00    Pack years: 6.25    Types: Cigarettes  . Smokeless tobacco: Former Systems developer    Quit date: 07/11/2015  . Tobacco comment: a pack will last a week-03/28/17  Substance Use Topics  . Alcohol use: Yes    Alcohol/week: 0.0 oz    Comment: occasionally  . Drug use: No     Allergies   Dust mite extract and Lisinopril   Review of Systems Review of Systems  Constitutional: Positive for fatigue. Negative for fever.  HENT: Positive for sore throat. Negative for congestion.   Eyes: Negative.   Respiratory: Positive for cough and shortness of breath. Negative for chest tightness.   Cardiovascular: Positive for chest pain.  Gastrointestinal: Negative for abdominal pain and nausea.  Genitourinary: Negative.   Musculoskeletal: Negative for arthralgias, joint swelling and neck pain.  Skin: Negative.  Negative for rash and wound.  Neurological: Negative  for dizziness, weakness, light-headedness, numbness and headaches.  Psychiatric/Behavioral: Negative.      Physical Exam Updated Vital Signs BP (!) 150/79   Pulse 82   Temp 99.1 F (37.3 C) (Oral)   Resp 19   Ht 5\' 7"  (1.702 m)   Wt 100.7 kg (222 lb)   SpO2 95%   BMI 34.77 kg/m   Physical Exam  Constitutional: He appears well-developed and well-nourished.  HENT:  Head: Normocephalic and atraumatic.  Eyes: Conjunctivae are normal.  Neck: Normal range of motion.  Cardiovascular: Normal rate, regular rhythm, normal heart sounds and intact distal pulses.  Pulmonary/Chest: Effort normal and breath sounds normal. He has no wheezes.  Abdominal: Soft. Bowel sounds are normal. There is no tenderness.  Musculoskeletal: Normal range of motion.  Neurological: He is alert.  Skin: Skin is warm and dry.  Psychiatric: He has a normal  mood and affect.  Nursing note and vitals reviewed.    ED Treatments / Results  Labs (all labs ordered are listed, but only abnormal results are displayed) Labs Reviewed  CBC WITH DIFFERENTIAL/PLATELET - Abnormal; Notable for the following components:      Result Value   Neutro Abs 7.9 (*)    All other components within normal limits  COMPREHENSIVE METABOLIC PANEL - Abnormal; Notable for the following components:   Glucose, Bld 165 (*)    Creatinine, Ser 1.69 (*)    GFR calc non Af Amer 45 (*)    GFR calc Af Amer 52 (*)    All other components within normal limits  CBG MONITORING, ED - Abnormal; Notable for the following components:   Glucose-Capillary 152 (*)    All other components within normal limits  TROPONIN I  TROPONIN I    EKG  EKG Interpretation None       Radiology Dg Chest 2 View  Result Date: 04/21/2017 CLINICAL DATA:  Shortness of breath, chest pain and cough for 2 days. EXAM: CHEST  2 VIEW COMPARISON:  June 04, 2016 FINDINGS: The heart size and mediastinal contours are within normal limits. There is no focal infiltrate,  pulmonary edema, or pleural effusion. The visualized skeletal structures are unremarkable. IMPRESSION: No active cardiopulmonary disease. Electronically Signed   By: Abelardo Diesel M.D.   On: 04/21/2017 18:39    Procedures Procedures (including critical care time)  Medications Ordered in ED Medications  albuterol (PROVENTIL) (2.5 MG/3ML) 0.083% nebulizer solution 5 mg (not administered)  ipratropium-albuterol (DUONEB) 0.5-2.5 (3) MG/3ML nebulizer solution 3 mL (3 mLs Nebulization Given 04/21/17 2301)  aerochamber plus with mask device 1 each (1 each Other Given 04/21/17 2301)     Initial Impression / Assessment and Plan / ED Course  I have reviewed the triage vital signs and the nursing notes.  Pertinent labs & imaging results that were available during my care of the patient were reviewed by me and considered in my medical decision making (see chart for details).     Pt with chest pain triggered by cough with sob, wheezing, sore throat and body aches suggesting flu like sx.  He was given an albuterol neb tx and ambulated without desaturation.  Given spacer for his home mdi, advised rest, f/u with pcp prn. Smoking cessation recommended. Delta trop negative, sx not cardiac. cxr clear, no pneumonia. VSS, no tachycardia or chest pain sx with inspiration, only with cough, doubt PE.   Pt seen by Dr. Winfred Leeds during this visit.  Final Clinical Impressions(s) / ED Diagnoses   Final diagnoses:  COPD exacerbation [facility])  Influenza-like illness    ED Discharge Orders    None       Landis Martins 04/22/17 0012    Orlie Dakin, MD 04/22/17 [phone]

## 2017-04-22 NOTE — Discharge Instructions (Signed)
Your tests including your chest xray are stable, your symptoms suggest you having a flare of your COPD.  Use your inhaler with the spacer provided you- 2 puffs every 4 hours if you are coughing or wheezing.  You need to stop smoking which is the bottom line trigger for many of your symptoms.  Call your doctor for a recheck of your symptoms and for assistance with smoking cessation.

## 2017-10-08 ENCOUNTER — Other Ambulatory Visit: Payer: Self-pay | Admitting: Pulmonary Disease

## 2017-10-14 ENCOUNTER — Telehealth: Payer: Self-pay | Admitting: Pulmonary Disease

## 2017-10-14 NOTE — Telephone Encounter (Signed)
PA request rec'd 10/13/17 for Stiolto Respimat 2.5 aerosol  PA initiated today via [email or web address] Key: [id number] - Rx #: [id number]  PA states STIOLTO RESPIMAT is NOT indicated to treat acute deterioration of COPD.  STIOLTO RESPIMAT is NOT indicated to treat asthma.   Routing message to [place] to f/u on PA

## 2017-10-21 NOTE — Telephone Encounter (Signed)
Checked CMM for update and received below message.  INDICATIONS AND USAGE  STIOLTO RESPIMAT is a combination of tiotropium, an anticholinergic and olodaterol, a long-acting beta2-adrenergic agonist (LABA) indicated for: The long-term, once-daily maintenance treatment of airflow obstruction in patients with chronic obstructive pulmonary disease (COPD)  Important limitations:  STIOLTO RESPIMAT is NOT indicated to treat acute deterioration of COPD.  STIOLTO RESPIMAT is NOT indicated to treat asthma.   I called and spoke to Judson Roch with [employer], who stated cover alternatives are anoro and Bevespi.  Dr. Vaughan Browner please advise if you would like to switch to alternative or proceed with PA. Thanks

## 2017-10-23 NOTE — Telephone Encounter (Signed)
He was on trelegy as per the last clinic note. Can you check if it is covered and if his inhalers were changed?

## 2017-10-24 NOTE — Telephone Encounter (Signed)
LMTCB- what inhaler is he currently on? Was he able to get the trelegy?

## 2017-10-28 NOTE — Telephone Encounter (Signed)
Called and spoke to pt. Pt states he is currently taking Trelegy once daily and feels this medication is effective.  Will route to Dr. Vaughan Browner to make aware.

## 2017-10-29 NOTE — Telephone Encounter (Signed)
Stop trelegy and start bevespi

## 2017-10-29 NOTE — Telephone Encounter (Signed)
Ok to change him to bevespi as it is covered

## 2017-10-29 NOTE — Telephone Encounter (Signed)
Patient currently using Trelegy. Do you want the trelegy D/C and replace it with bevespi or do you want the patient using both.  PM please advise.

## 2017-10-29 NOTE — Telephone Encounter (Signed)
lmtcb x1 for pt. 

## 2017-10-30 NOTE — Telephone Encounter (Signed)
Attempted to call patient today. I did not receive an answer at time of call. I have left a voicemail message for pt to return call. X2

## 2017-10-31 NOTE — Telephone Encounter (Signed)
Attempted to call patient today regarding results. I did not receive an answer at time of call. I have left a voicemail message for pt to return call. X3

## 2017-11-13 DIAGNOSIS — Z029 Encounter for administrative examinations, unspecified: Secondary | ICD-10-CM

## 2017-11-14 ENCOUNTER — Encounter: Payer: Self-pay | Admitting: Nurse Practitioner

## 2017-11-14 ENCOUNTER — Ambulatory Visit (INDEPENDENT_AMBULATORY_CARE_PROVIDER_SITE_OTHER): Payer: BLUE CROSS/BLUE SHIELD | Admitting: Nurse Practitioner

## 2017-11-14 VITALS — BP 192/124 | HR 71 | Ht 68.0 in | Wt 222.0 lb

## 2017-11-14 DIAGNOSIS — R911 Solitary pulmonary nodule: Secondary | ICD-10-CM | POA: Diagnosis not present

## 2017-11-14 DIAGNOSIS — J449 Chronic obstructive pulmonary disease, unspecified: Secondary | ICD-10-CM

## 2017-11-14 DIAGNOSIS — I10 Essential (primary) hypertension: Secondary | ICD-10-CM | POA: Diagnosis not present

## 2017-11-14 MED ORDER — HYDRALAZINE HCL 25 MG PO TABS: 25.0000 mg | ORAL_TABLET | Freq: Every day | ORAL | 0 refills | Status: DC

## 2017-11-14 MED ORDER — AMLODIPINE BESYLATE 10 MG PO TABS: 10.0000 mg | ORAL_TABLET | Freq: Every day | ORAL | 0 refills | Status: DC

## 2017-11-14 MED ORDER — LOSARTAN POTASSIUM 25 MG PO TABS: 25.0000 mg | ORAL_TABLET | Freq: Every day | ORAL | 0 refills | Status: DC

## 2017-11-14 MED ORDER — FLUTICASONE-UMECLIDIN-VILANT 100-62.5-25 MCG/INH IN AEPB
1.0000 | INHALATION_SPRAY | Freq: Every day | RESPIRATORY_TRACT | 0 refills | Status: DC
Start: 2017-11-14 — End: 2017-11-20

## 2017-11-14 NOTE — Patient Instructions (Addendum)
Will refill BP medications 1 time - please get set up with a new PCP Will give sample of trelegy Will refill ventolin Will give mucinex samples today Low sodium diet Please stop smoking Follow up with Dr. Ardis Hughs in 1 month or sooner if needed

## 2017-11-14 NOTE — Progress Notes (Signed)
@Patient  ID: Craig Knight, male    DOB: 08-May-1965, 52 y.o.   MRN: [id number]  Chief Complaint  Patient presents with  . COPD    Referring provider: No ref. provider found  HPI 52 year old male current smoker with COPD GOLD B followed by Dr. Vaughan Browner.   Note: BP is elevated - will refill BP meds x 1 until he can get set up with PCP per Dr. Vaughan Browner  Tests: FENO 05/29/16- 12 FENO 07/30/16- 9  PFTs 04/24/16 FVC 2.01 (62%), FEV1 1.65 (53%), F/F 69, TLC 75%, RV/TLC 130% DLCO 65%, DLCO/VA 102% Moderate to severe obstructive airway disease with no bronchodilator response. Moderate diffusion impairment which corrects for alveolar volume Mild reduction in total lung capacity, airtrapping.  Imaging Chest x-ray 02/16/16-no acute cardio pulmonary abnormality. Chest x-ray 06/04/16- cardio megaly, left base atelectasis CT abdomen 06/04/16- mild left base atelectasis with scarring. No acute infiltrate or fibrosis Coronary 01/08/17- Lung images show 4 mm right middle lobe lung nodule with no acute lung infiltrate or consolidation. CT angiogram 03/10/17-no pulmonary embolism, prominence of main pulmonary outflow tract.  Subcentimeter pulmonary nodule.  The largest measuring 4 mm.  LVH. I reviewed all images personally.  CBC with diff 06/04/16- WBC 13.6. Eos 2%, absolute eos count 272 Blood allergy profile- IgE 133, sensitive to weed, grass and elm.   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 Grade 1 diastolic dysfunction. Mild left atrial enlargement. Trivial tricuspid regurgitation.  Echo 03/17/17 LV cavity is vigorous with near cavity obliteration, LVOT is narrow.  Moderate LVH, grade 1 diastolic dysfunction  Stress test 03/25/16- intermediate risk study, nuclear stress to EF 38%.  Significant hypertensive response to exercise  Sleep eval Sleep study 12/23/13 Mild sleep  apnea.  Sleep study 08/05/16 Mild to moderate obstructive sleep apnea occurred during the diagnostic portion of the study (AHI = 14.4 /hour). CPAP titration was attempted but resulted in worsening apneic events. A formal CPAP/BiPAP titration study is recommended.  Titration study 11/07/16 He did not tolerate CPAP due to difficulty exhaling against the pressure. Trial of BiPAP therapy on 8/4 cm H2O with a Medium size Resmed Nasal Pillow Mask AirFit P10 mask and heated humidification  OV 11/14/17 - Acute COPD Flare Patient presents with shortness of breath. This has progressively worsened over the past few weeks. His blood pressure is elevated in the office today. He states that he was discharged from his PCP office and has run out of his BP medications. He will be going to the health department next week to be seen and hopefully get set up with a new PCP. He has also ran out of his telegy. He could not afford to get it refilled. He denies any chest pain, fever, or edema.     Allergies  Allergen Reactions  . Dust Mite Extract   . Lisinopril Cough    There is no immunization history for the selected administration types on file for this patient.  Past Medical History:  Diagnosis Date  . Asthma   . Bronchitis   . COPD (chronic obstructive pulmonary disease) ([place])   . Diabetes mellitus without complication ([place])   . Hypertension     Tobacco History: Social History   Tobacco Use  Smoking Status Current Some Day Smoker  . Packs/day: 0.25  . Years: 25.00  . Pack years: 6.25  . Types: Cigarettes  Smokeless Tobacco Former Systems developer  . Quit date: 07/11/2015  Tobacco  Comment   a pack will last a week-03/28/17   Ready to quit: No Counseling given: Yes Comment: a pack will last a week-03/28/17   Outpatient Encounter Medications as of 11/14/2017  Medication Sig  . Fluticasone-Umeclidin-Vilant (TRELEGY ELLIPTA) 100-62.5-25 MCG/INH AEPB Inhale 2 sprays into the lungs every morning.  .  [DISCONTINUED] TRELEGY ELLIPTA 100-62.5-25 MCG/INH AEPB INHALE 1 PUFF ONCE DAILY  . amLODipine (NORVASC) 10 MG tablet Take 1 tablet (10 mg total) by mouth daily.  Marland Kitchen aspirin EC 81 MG tablet Take 1 tablet (81 mg total) by mouth daily. (Patient not taking: Reported on 11/14/2017)  . chlorthalidone (HYGROTON) 25 MG tablet Take 1 tablet (25 mg total) by mouth daily. (Patient not taking: Reported on 11/14/2017)  . Fluticasone-Umeclidin-Vilant (TRELEGY ELLIPTA) 100-62.5-25 MCG/INH AEPB Inhale 1 puff into the lungs daily.  . hydrALAZINE (APRESOLINE) 25 MG tablet Take 1 tablet (25 mg total) by mouth daily.  Marland Kitchen ipratropium-albuterol (DUONEB) 0.5-2.5 (3) MG/3ML SOLN Take 3 mLs by nebulization every 4 (four) hours as needed. (Patient not taking: Reported on 11/14/2017)  . isosorbide mononitrate (IMDUR) 60 MG 24 hr tablet Take 1 tablet (60 mg total) by mouth daily. (Patient not taking: Reported on 11/14/2017)  . losartan (COZAAR) 25 MG tablet Take 1 tablet (25 mg total) by mouth daily.  . metFORMIN (GLUCOPHAGE) 500 MG tablet Take 1 tablet (500 mg total) by mouth daily with breakfast. (Patient not taking: Reported on 11/14/2017)  . nitroGLYCERIN (NITROSTAT) 0.4 MG SL tablet Place 1 tablet (0.4 mg total) under the tongue every 5 (five) minutes as needed for chest pain. (Patient not taking: Reported on 11/14/2017)  . VENTOLIN HFA 108 (90 Base) MCG/ACT inhaler INHALE 2 PUFFS BY MOUTH EVERY 6 HOURS AS NEEDED FOR WHEEZING OR SHORTNESS OF BREATH (Patient not taking: Reported on 11/14/2017)  . [DISCONTINUED] amLODipine (NORVASC) 10 MG tablet Take 1 tablet (10 mg total) by mouth daily. (Patient not taking: Reported on 11/14/2017)  . [DISCONTINUED] hydrALAZINE (APRESOLINE) 25 MG tablet Take 25 mg by mouth daily.  . [DISCONTINUED] losartan (COZAAR) 25 MG tablet Take 25 mg by mouth daily.   No facility-administered encounter medications on file as of 11/14/2017.      Review of Systems  Review of Systems  Constitutional:  Negative.  Negative for chills and fever.  HENT: Negative.  Negative for congestion and postnasal drip.   Respiratory: Positive for shortness of breath. Negative for cough.   Cardiovascular: Negative.  Negative for chest pain, palpitations and leg swelling.  Gastrointestinal: Negative.   Allergic/Immunologic: Negative.   Neurological: Negative.   Psychiatric/Behavioral: Negative.        Physical Exam  BP (!) 192/124 (BP Location: Left Arm, Patient Position: Sitting, Cuff Size: Normal)   Pulse 71   Ht 5\' 8"  (1.727 m)   Wt 222 lb (100.7 kg)   SpO2 98%   BMI 33.75 kg/m   Wt Readings from Last 5 Encounters:  11/14/17 222 lb (100.7 kg)  04/21/17 222 lb (100.7 kg)  03/28/17 223 lb 12.8 oz (101.5 kg)  03/07/17 223 lb (101.2 kg)  01/14/17 222 lb 6 oz (100.9 kg)     Physical Exam  Constitutional: He is oriented to person, place, and time. He appears well-developed and well-nourished. No distress.  Cardiovascular: Normal rate and regular rhythm.  Pulmonary/Chest: Effort normal and breath sounds normal. He has no wheezes. He has no rales.  Musculoskeletal: He exhibits no edema.  Neurological: He is alert and oriented to person, place, and time.  Skin: Skin is warm and dry.  Psychiatric: He has a normal mood and affect.  Nursing note and vitals reviewed.     Assessment & Plan:   Chronic obstructive pulmonary disease ([place]) Patient Instructions  Will refill BP medications 1 time - please get set up with a new PCP Will give sample of trelegy Will refill ventolin Will give mucinex samples today Low sodium diet Please stop smoking Follow up with Dr. Ardis Hughs in 1 month or sooner if needed     HTN (hypertension) Patient Instructions  Will refill BP medications 1 time - please get set up with a new PCP Will give sample of trelegy Will refill ventolin Will give mucinex samples today Low sodium diet Please stop smoking Follow up with Dr. Ardis Hughs in 1 month or sooner if  needed     Lung nodule Will need follow up CT scan in January or February 2020     Fenton Foy, NP 11/17/2017

## 2017-11-17 ENCOUNTER — Encounter: Payer: Self-pay | Admitting: Nurse Practitioner

## 2017-11-17 ENCOUNTER — Telehealth: Payer: Self-pay | Admitting: Nurse Practitioner

## 2017-11-17 DIAGNOSIS — R911 Solitary pulmonary nodule: Secondary | ICD-10-CM | POA: Insufficient documentation

## 2017-11-17 MED ORDER — ALBUTEROL SULFATE HFA 108 (90 BASE) MCG/ACT IN AERS: INHALATION_SPRAY | RESPIRATORY_TRACT | 2 refills | Status: DC

## 2017-11-17 NOTE — Telephone Encounter (Signed)
Called and spoke with patient advised that I have sent prescription refill in. Nothing further needed.

## 2017-11-17 NOTE — Assessment & Plan Note (Signed)
Patient Instructions  Will refill BP medications 1 time - please get set up with a new PCP Will give sample of trelegy Will refill ventolin Will give mucinex samples today Low sodium diet Please stop smoking Follow up with Dr. Ardis Hughs in 1 month or sooner if needed

## 2017-11-17 NOTE — Assessment & Plan Note (Signed)
Will need follow up CT scan in January or February 2020

## 2017-11-20 ENCOUNTER — Encounter: Payer: Self-pay | Admitting: General Surgery

## 2017-11-20 MED ORDER — ALBUTEROL SULFATE HFA 108 (90 BASE) MCG/ACT IN AERS: 1.0000 | INHALATION_SPRAY | Freq: Four times a day (QID) | RESPIRATORY_TRACT | 6 refills | Status: DC | PRN

## 2017-11-20 MED ORDER — IPRATROPIUM-ALBUTEROL 0.5-2.5 (3) MG/3ML IN SOLN: 3.0000 mL | RESPIRATORY_TRACT | 6 refills | Status: DC | PRN

## 2017-11-20 MED ORDER — FLUTICASONE-UMECLIDIN-VILANT 100-62.5-25 MCG/INH IN AEPB: 1.0000 | INHALATION_SPRAY | Freq: Every day | RESPIRATORY_TRACT | 6 refills | Status: DC

## 2017-11-20 MED ORDER — FLUTICASONE-UMECLIDIN-VILANT 100-62.5-25 MCG/INH IN AEPB: 1.0000 | INHALATION_SPRAY | Freq: Every day | RESPIRATORY_TRACT | 3 refills | Status: DC

## 2017-11-20 MED ORDER — IPRATROPIUM-ALBUTEROL 0.5-2.5 (3) MG/3ML IN SOLN: 3.0000 mL | RESPIRATORY_TRACT | Status: DC

## 2017-11-20 NOTE — Addendum Note (Signed)
Addended by: Fenton Foy on: 11/20/2017 02:04 PM   Modules accepted: Orders

## 2017-11-20 NOTE — Progress Notes (Signed)
Fax received from [facility] stating they received paperwork faxed on 11/14/17. However, they needed the prescriptions in printed form in order to complete patient assistance process. Prescriptions printed/signed and faxed to [facility] at [phone]. Confirmation received and sent to medical records to be scanned into the chart.

## 2017-11-20 NOTE — Addendum Note (Signed)
Addended by: Nena Polio on: 11/20/2017 02:24 PM   Modules accepted: Orders

## 2017-11-20 NOTE — Addendum Note (Signed)
Addended by: Fenton Foy on: 11/20/2017 02:09 PM   Modules accepted: Orders

## 2017-11-24 ENCOUNTER — Encounter: Payer: Self-pay | Admitting: General Surgery

## 2017-11-25 ENCOUNTER — Telehealth: Payer: Self-pay | Admitting: Pulmonary Disease

## 2017-11-25 NOTE — Telephone Encounter (Signed)
Called patient, unable to reach left message to give us a call back. 

## 2017-11-26 ENCOUNTER — Telehealth: Payer: Self-pay | Admitting: Pulmonary Disease

## 2017-11-26 NOTE — Telephone Encounter (Signed)
Appointment made for Tuesday October 8th at 945am. Nothing further is needed at this time.

## 2017-11-26 NOTE — Telephone Encounter (Signed)
Left message informing patient there is no documentation of any one here having contacted him and if he needed anything to give [place] a call. Nothing further needed at this time.

## 2017-12-02 ENCOUNTER — Ambulatory Visit: Payer: BLUE CROSS/BLUE SHIELD | Admitting: Pulmonary Disease

## 2017-12-04 ENCOUNTER — Ambulatory Visit: Payer: BLUE CROSS/BLUE SHIELD | Admitting: Pulmonary Disease

## 2017-12-05 ENCOUNTER — Ambulatory Visit (INDEPENDENT_AMBULATORY_CARE_PROVIDER_SITE_OTHER): Payer: BLUE CROSS/BLUE SHIELD | Admitting: Pulmonary Disease

## 2017-12-05 ENCOUNTER — Telehealth: Payer: Self-pay | Admitting: Pulmonary Disease

## 2017-12-05 ENCOUNTER — Encounter: Payer: Self-pay | Admitting: Pulmonary Disease

## 2017-12-05 VITALS — BP 164/104 | HR 67 | Ht 68.0 in | Wt 222.6 lb

## 2017-12-05 DIAGNOSIS — R911 Solitary pulmonary nodule: Secondary | ICD-10-CM | POA: Diagnosis not present

## 2017-12-05 DIAGNOSIS — G4733 Obstructive sleep apnea (adult) (pediatric): Secondary | ICD-10-CM

## 2017-12-05 DIAGNOSIS — J449 Chronic obstructive pulmonary disease, unspecified: Secondary | ICD-10-CM

## 2017-12-05 MED ORDER — FLUTICASONE-UMECLIDIN-VILANT 100-62.5-25 MCG/INH IN AEPB: 1.0000 | INHALATION_SPRAY | Freq: Every day | RESPIRATORY_TRACT | 6 refills | Status: DC

## 2017-12-05 MED ORDER — ALBUTEROL SULFATE HFA 108 (90 BASE) MCG/ACT IN AERS: 1.0000 | INHALATION_SPRAY | Freq: Four times a day (QID) | RESPIRATORY_TRACT | 6 refills | Status: DC | PRN

## 2017-12-05 NOTE — Telephone Encounter (Signed)
Spoke to [place] for update on application for trelegy and ventolin.  Per [place] pt's address was incorrect.  I have provided [place] with the correct address.  Ventolin and Terelgy has been approved and was mailed out to incorrect address.  per [place] address will be corrected and shipment will be mailed to correct address.  Pt is aware and voiced his understanding. Nothing further is needed.

## 2017-12-05 NOTE — Patient Instructions (Signed)
You need to take your medications on a regular basis and maintain good control of your COPD, hypertension, diabetes and sleep apnea Please follow-up with primary care and also please call Dr. Debara Pickett, cardiology to ensure cardiology follow-up Start using the BiPAP at night Follow-up in 3 months.

## 2017-12-05 NOTE — Progress Notes (Signed)
Craig Knight    [id number]    [birth year]  Primary Care Physician:Patient, No Pcp Per  Referring Physician: No referring provider defined for this encounter.  Chief complaint:   Follow up for  COPD GOLD B (CAT score 29, 0 exacerbations) Asthma Non compliance  HPI: Craig Knight is 52 year old with past medical history of COPD, asthma, hypertension, DM, diastolic active smoker. He was diagnosed with asthma as a child which got better in adulthood. 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 He reports mainly dyspnea on exertion and does not have dyspnea on rest, wheezing. He has daily symptoms of cough with white sputum production.nHe does not have seasonal allergies, sensitivities to cats, dogs, strong perfumes, no heartburn symptoms. Evaluated by cardiology and had a CT coronary done which showed intermediate risk for ischemia.  Lung images shows a 4 mm nodule with no other acute lung abnormality.  He has over 38-pack-year smoking history.  Continues to smoke a few cigarettes every day Currently disabled due to dyspnea.  Interim history: He has been noncompliant with medications over the past year Seen in clinic February 2536 with systolic blood pressure in the 190s. He was also off trelegy and not using his BiPAP. He was restarted on his hypertension medications and told to follow-up with primary care.   Blood pressure today is improved to 160.  He is in the process of getting a new primary care and has an appointment next week. Restarted on trelegy and feels dyspnea is better.  Has not used his BiPAP for several weeks due to sinus congestion. Continues to smoke 1 to 2 cigarettes every day.  Outpatient Encounter Medications as of 12/05/2017  Medication Sig  . albuterol (PROVENTIL HFA;VENTOLIN HFA) 108 (90 Base) MCG/ACT inhaler Inhale 1 puff into the lungs every 6 (six) hours  as needed for wheezing or shortness of breath.  Marland Kitchen amLODipine (NORVASC) 10 MG tablet Take 1 tablet (10 mg total) by mouth daily.  Marland Kitchen aspirin EC 81 MG tablet Take 1 tablet (81 mg total) by mouth daily.  . chlorthalidone (HYGROTON) 25 MG tablet Take 1 tablet (25 mg total) by mouth daily.  . Fluticasone-Umeclidin-Vilant (TRELEGY ELLIPTA) 100-62.5-25 MCG/INH AEPB Inhale 1 puff into the lungs daily.  . hydrALAZINE (APRESOLINE) 25 MG tablet Take 1 tablet (25 mg total) by mouth daily.  Marland Kitchen ipratropium-albuterol (DUONEB) 0.5-2.5 (3) MG/3ML SOLN Take 3 mLs by nebulization every 4 (four) hours as needed.  . isosorbide mononitrate (IMDUR) 60 MG 24 hr tablet Take 1 tablet (60 mg total) by mouth daily.  Marland Kitchen losartan (COZAAR) 25 MG tablet Take 1 tablet (25 mg total) by mouth daily.  . metFORMIN (GLUCOPHAGE) 500 MG tablet Take 1 tablet (500 mg total) by mouth daily with breakfast.  . nitroGLYCERIN (NITROSTAT) 0.4 MG SL tablet Place 1 tablet (0.4 mg total) under the tongue every 5 (five) minutes as needed for chest pain.   No facility-administered encounter medications on file as of 12/05/2017.    Physical Exam: Blood pressure (!) 164/104, pulse 67, height 5\' 8"  (1.727 m), weight 222 lb 9.6 oz (101 kg), SpO2 99 %. Gen:      No acute distress HEENT:  EOMI, sclera anicteric Neck:     No masses; no thyromegaly Lungs:    Clear to auscultation bilaterally; normal respiratory effort CV:  Regular rate and rhythm; no murmurs Abd:      + bowel sounds; soft, non-tender; no palpable masses, no distension Ext:    No edema; adequate peripheral perfusion Skin:      Warm and dry; no rash Neuro: alert and oriented x 3 Psych: normal mood and affect  Data Reviewed: Imaging CT abdomen 06/04/16- mild left base atelectasis with scarring. No acute infiltrate or fibrosis Coronary 01/08/17- Lung images show 4 mm right middle lobe lung nodule with no acute lung infiltrate or consolidation. CT angiogram 03/10/17-no pulmonary  embolism, prominence of main pulmonary outflow tract.  Subcentimeter pulmonary nodule.  The largest measuring 4 mm. I reviewed all images personally.  PFTs PFTs 04/24/16 FVC 2.01 (62%), FEV1 1.65 (53%), F/F 69, TLC 75%, RV/TLC 130%, DLCO 65%, DLCO/VA 102% Moderate to severe obstructive airway disease with no bronchodilator response. Moderate diffusion impairment which corrects for alveolar volume Mild reduction in total lung capacity, airtrapping.  FENO 05/29/16- 12 FENO 07/30/16- 9  Labs CBC with diff 06/04/16- WBC 13.6. Eos 2%, absolute eos count 272 Blood allergy profile- IgE 133, sensitive to weed, grass and elm.  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 Grade 1   diastolic dysfunction. Mild left atrial enlargement. Trivial   tricuspid regurgitation.  Echo 03/17/17 LV cavity is vigorous with near cavity obliteration, LVOT is narrow.  Moderate LVH, grade 1 diastolic dysfunction  Stress test 03/25/16- intermediate risk study, nuclear stress to EF 38%.  Significant hypertensive response to exercise  Cardiopulmonary exercise test 04/07/17 The interpretation of this test is greatly limited due to submaximal effort during the exercise. Based on available data, exercise testing with gas exchange demonstrates mild functional impairment when compared to matched sedentary norms. There is no clear indication for circulatory limitation, however there was a marked hypertensive response at peak exercise. Pre-exercise spirometry demonstrates mild restrictive properties which are very likely related to his body habitus. At peak exercise and measurements IPE, patient appears mildly limited due to exercise-induced bronchospasm.   Sleep eval Sleep study 08/05/16 Mild to moderate obstructive sleep apnea occurred during the diagnostic portion of the study (AHI = 14.4  /hour). CPAP titration was attempted but resulted in worsening apneic events. A formal CPAP/BiPAP titration study is recommended.  Titration study 11/07/16 He did not tolerate CPAP due to difficulty exhaling against the pressure. Trial of BiPAP therapy on 8/4 cm H2O with a Medium size Resmed Nasal Pillow Mask AirFit P10 mask and heated humidification  Assessment:  COPD GOLD B, Asthma Review of his PFTs shows obstruction with no bronchodilator response. In addition he has air trapping consistent with emphysema.  Cardiopulmonary test shows exercise-induced bronchospasm indicator of a component of asthma Restarted on trelegy inhaler, continue albuterol, duo nebs as needed  Suspect his dyspnea is multifactorial from COPD, severe LVH with diastolic dysfunction, deconditioning, obesity. Had a long discussion today about the need for him to resume his medications, maintain good blood pressure diabetes control and follow-up with primary care and cardiology  Moderate sleep apnea Noncompliant with BiPAP due to sinus congestion He will use antihistamine over-the-counter and retry.  Subcentimeter pulmonary nodule Follow up scan ordered for January 2020.  Health maintenance Did not want flu or pneumococcus vaccination.  Plan/Recommendations: - ResumeTrelegy, albuterol rescue inhaler - Resume Bipap.   Marshell Garfinkel MD [facility] 12/05/2017, 9:21 AM  CC: No ref. provider found

## 2017-12-09 ENCOUNTER — Telehealth: Payer: Self-pay | Admitting: *Deleted

## 2017-12-09 NOTE — Telephone Encounter (Signed)
Called to schedule chest cta ordered by Dr Charlynne Cousins to reach patient--will try again

## 2017-12-11 NOTE — Telephone Encounter (Signed)
Left message for patient to call regarding CTA due in November, 2019

## 2018-02-11 ENCOUNTER — Ambulatory Visit: Payer: BLUE CROSS/BLUE SHIELD | Admitting: Internal Medicine

## 2018-03-10 ENCOUNTER — Ambulatory Visit (HOSPITAL_COMMUNITY): Admission: RE | Admit: 2018-03-10 | Payer: BLUE CROSS/BLUE SHIELD | Source: Ambulatory Visit

## 2018-03-19 ENCOUNTER — Ambulatory Visit: Payer: BLUE CROSS/BLUE SHIELD | Admitting: Internal Medicine

## 2018-03-19 DIAGNOSIS — R0989 Other specified symptoms and signs involving the circulatory and respiratory systems: Secondary | ICD-10-CM

## 2018-04-16 ENCOUNTER — Emergency Department (HOSPITAL_COMMUNITY)
Admission: EM | Admit: 2018-04-16 | Discharge: 2018-04-16 | Disposition: A | Discharge status: Home / Self Care | Payer: Medicare HMO | Attending: Emergency Medicine | Admitting: Emergency Medicine

## 2018-04-16 ENCOUNTER — Emergency Department (HOSPITAL_COMMUNITY): Payer: Medicare HMO

## 2018-04-16 ENCOUNTER — Encounter (HOSPITAL_COMMUNITY): Payer: Self-pay | Admitting: Emergency Medicine

## 2018-04-16 ENCOUNTER — Other Ambulatory Visit: Payer: Self-pay

## 2018-04-16 DIAGNOSIS — J449 Chronic obstructive pulmonary disease, unspecified: Secondary | ICD-10-CM | POA: Diagnosis not present

## 2018-04-16 DIAGNOSIS — I5032 Chronic diastolic (congestive) heart failure: Secondary | ICD-10-CM | POA: Diagnosis not present

## 2018-04-16 DIAGNOSIS — N183 Chronic kidney disease, stage 3 (moderate): Secondary | ICD-10-CM | POA: Insufficient documentation

## 2018-04-16 DIAGNOSIS — Z7984 Long term (current) use of oral hypoglycemic drugs: Secondary | ICD-10-CM | POA: Insufficient documentation

## 2018-04-16 DIAGNOSIS — Z79899 Other long term (current) drug therapy: Secondary | ICD-10-CM | POA: Insufficient documentation

## 2018-04-16 DIAGNOSIS — I130 Hypertensive heart and chronic kidney disease with heart failure and stage 1 through stage 4 chronic kidney disease, or unspecified chronic kidney disease: Secondary | ICD-10-CM | POA: Insufficient documentation

## 2018-04-16 DIAGNOSIS — F17210 Nicotine dependence, cigarettes, uncomplicated: Secondary | ICD-10-CM | POA: Diagnosis not present

## 2018-04-16 DIAGNOSIS — Z7982 Long term (current) use of aspirin: Secondary | ICD-10-CM | POA: Diagnosis not present

## 2018-04-16 DIAGNOSIS — R69 Illness, unspecified: Secondary | ICD-10-CM | POA: Diagnosis not present

## 2018-04-16 DIAGNOSIS — I160 Hypertensive urgency: Secondary | ICD-10-CM | POA: Diagnosis not present

## 2018-04-16 DIAGNOSIS — R079 Chest pain, unspecified: Secondary | ICD-10-CM | POA: Diagnosis not present

## 2018-04-16 DIAGNOSIS — E1122 Type 2 diabetes mellitus with diabetic chronic kidney disease: Secondary | ICD-10-CM | POA: Diagnosis not present

## 2018-04-16 DIAGNOSIS — R05 Cough: Secondary | ICD-10-CM | POA: Diagnosis not present

## 2018-04-16 LAB — BASIC METABOLIC PANEL
Anion gap: 8 (ref 5–15)
BUN: 23 mg/dL — ABNORMAL HIGH (ref 6–20)
CO2: 24 mmol/L (ref 22–32)
CREATININE: 1.57 mg/dL — AB (ref 0.61–1.24)
Calcium: 9.2 mg/dL (ref 8.9–10.3)
Chloride: 108 mmol/L (ref 98–111)
GFR calc non Af Amer: 50 mL/min — ABNORMAL LOW (ref >60)
GFR, EST AFRICAN AMERICAN: 57 mL/min — AB (ref >60)
Glucose, Bld: 161 mg/dL — ABNORMAL HIGH (ref 70–99)
POTASSIUM: 4.1 mmol/L (ref 3.5–5.1)
SODIUM: 140 mmol/L (ref 135–145)

## 2018-04-16 LAB — CBC
HEMATOCRIT: 51.2 % (ref 39.0–52.0)
Hemoglobin: 16.3 g/dL (ref 13.0–17.0)
MCH: 26.4 pg (ref 26.0–34.0)
MCHC: 31.8 g/dL (ref 30.0–36.0)
MCV: 82.8 fL (ref 80.0–100.0)
NRBC: 0 % (ref 0.0–0.2)
Platelets: 263 10*3/uL (ref 150–400)
RBC: 6.18 MIL/uL — ABNORMAL HIGH (ref 4.22–5.81)
RDW: 13.2 % (ref 11.5–15.5)
WBC: 10.9 10*3/uL — AB (ref 4.0–10.5)

## 2018-04-16 LAB — TROPONIN I: Troponin I: 0.04 ng/mL (ref <0.03)

## 2018-04-16 IMAGING — DX DG CHEST 2V
2 series · 2 of 2 positions shown · non-contrast
Comparison: [DATE] and [DATE]

CLINICAL DATA: Intermittent chest pain with abdominal pain from
hernia few months. One week nonproductive cough.

EXAM:
CHEST - 2 VIEW

[chest pa]
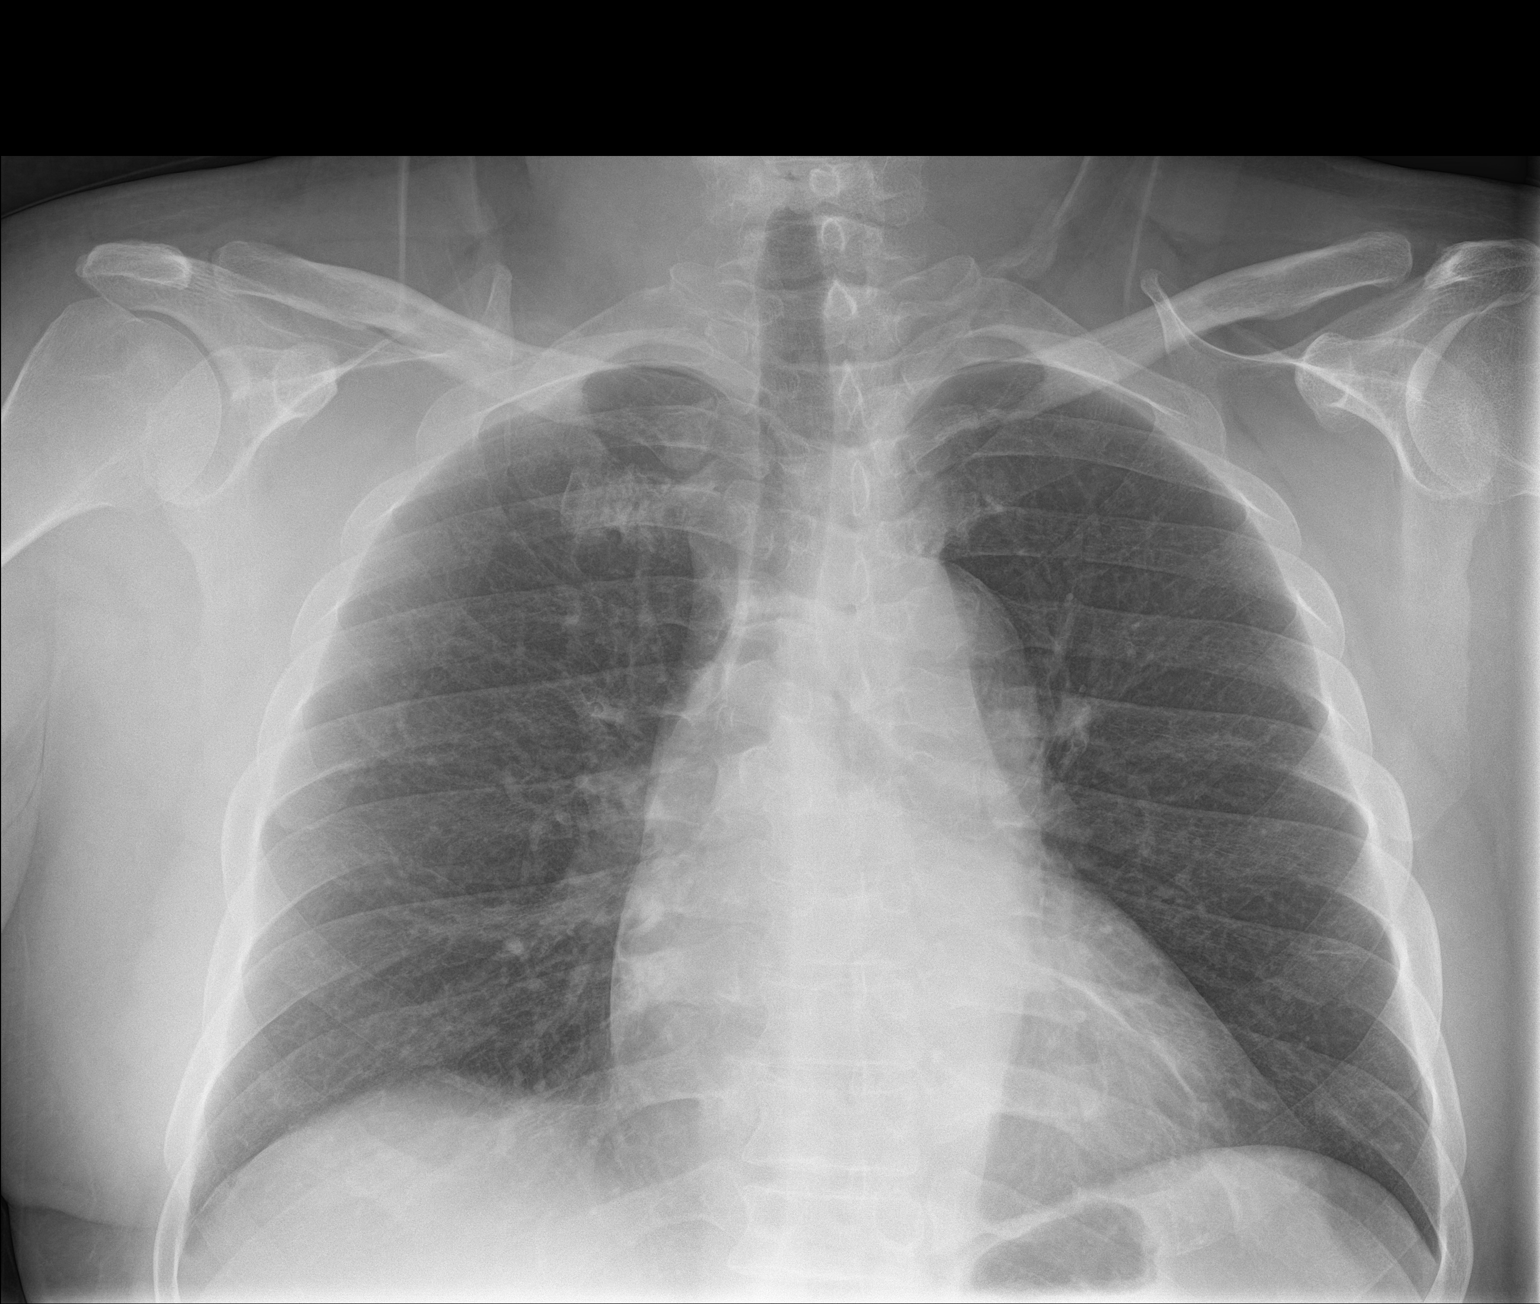

[chest lat]
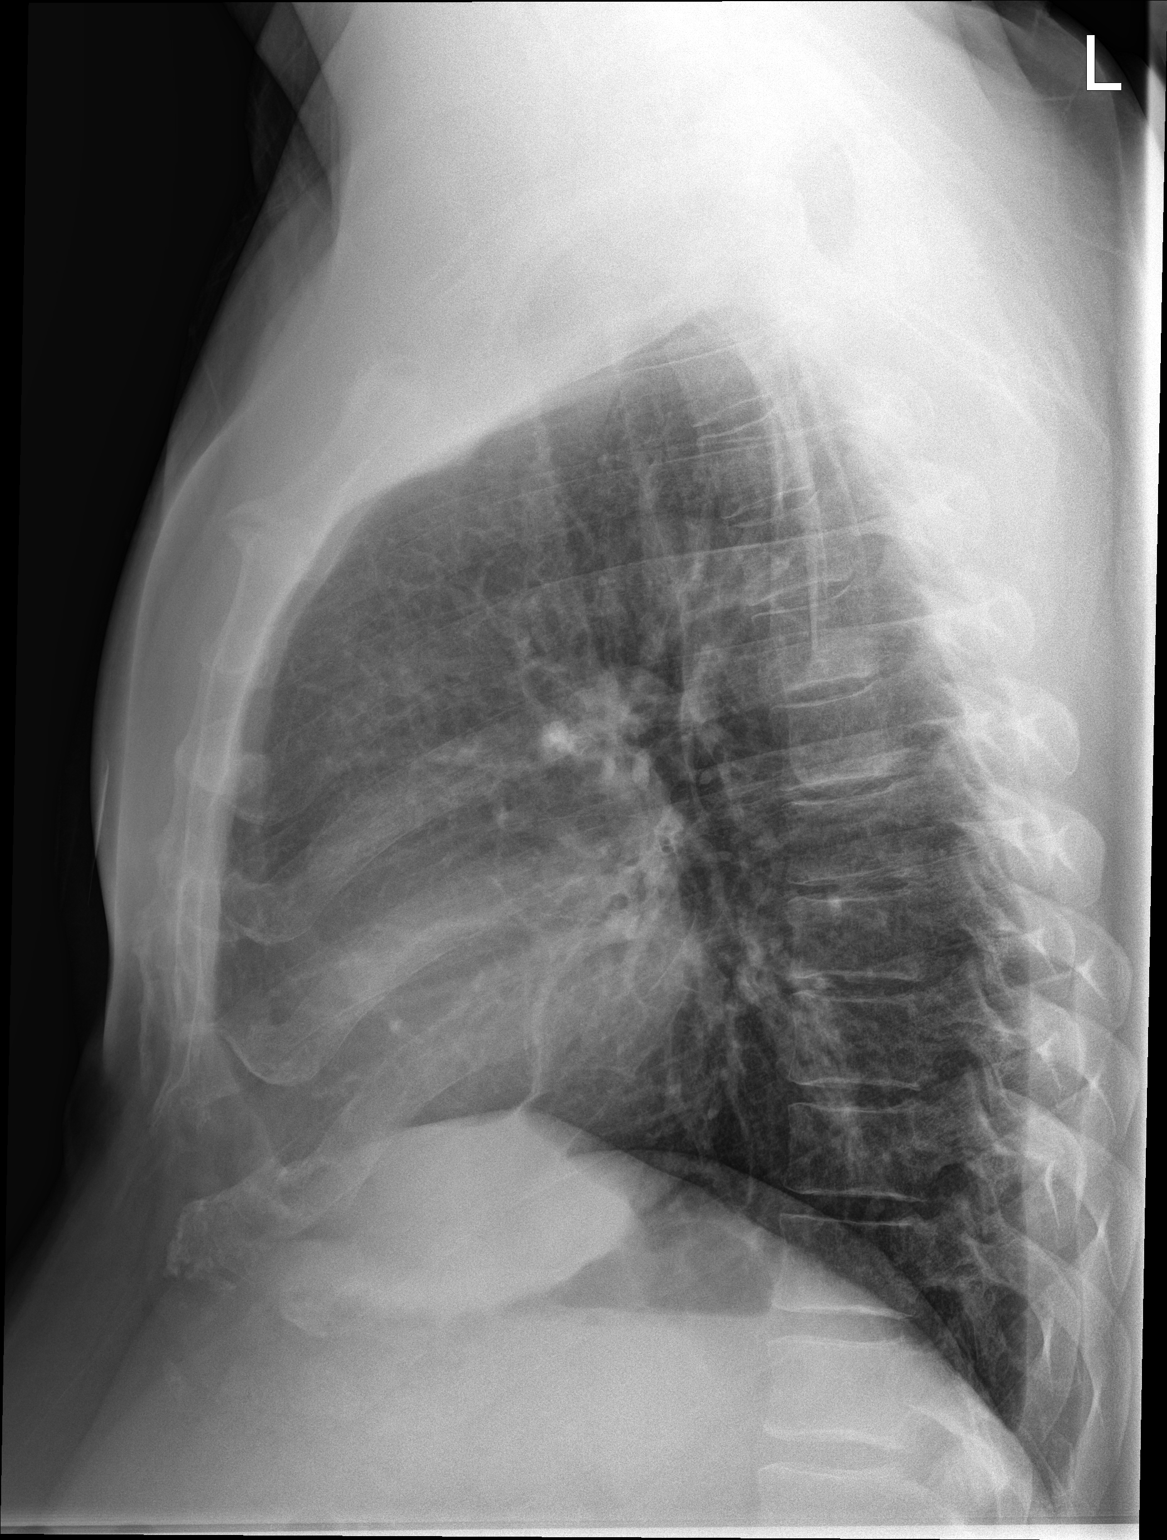

[2 of 2 positions shown; findings below may reference images not displayed]

FINDINGS: Lungs are adequately inflated without focal consolidation or
effusion. Cardiomediastinal silhouette and remainder of the exam is
unchanged.
IMPRESSION: No active cardiopulmonary disease.

## 2018-04-16 MED ORDER — SODIUM CHLORIDE 0.9% FLUSH: 3.0000 mL | Freq: Once | INTRAVENOUS | Status: DC

## 2018-04-16 MED ORDER — CHLORTHALIDONE 25 MG PO TABS
25.0000 mg | ORAL_TABLET | Freq: Once | ORAL | Status: AC
  Administered 2018-04-16: 25 mg via ORAL
  Filled 2018-04-16 (×2): qty 1

## 2018-04-16 MED ORDER — AMLODIPINE BESYLATE 5 MG PO TABS
10.0000 mg | ORAL_TABLET | Freq: Once | ORAL | Status: AC
  Administered 2018-04-16: 10 mg via ORAL
  Filled 2018-04-16: qty 2

## 2018-04-16 MED ORDER — HYDRALAZINE HCL 25 MG PO TABS: 25.0000 mg | ORAL_TABLET | Freq: Every day | ORAL | 0 refills | Status: DC

## 2018-04-16 MED ORDER — NITROGLYCERIN 0.4 MG SL SUBL: 0.4000 mg | SUBLINGUAL_TABLET | SUBLINGUAL | 0 refills | Status: DC | PRN

## 2018-04-16 MED ORDER — LOSARTAN POTASSIUM 25 MG PO TABS: 25.0000 mg | ORAL_TABLET | Freq: Every day | ORAL | 0 refills | Status: DC

## 2018-04-16 MED ORDER — ASPIRIN EC 81 MG PO TBEC: 81.0000 mg | DELAYED_RELEASE_TABLET | Freq: Every day | ORAL | 0 refills | Status: AC

## 2018-04-16 MED ORDER — HYDRALAZINE HCL 25 MG PO TABS
25.0000 mg | ORAL_TABLET | Freq: Once | ORAL | Status: AC
  Administered 2018-04-16: 25 mg via ORAL
  Filled 2018-04-16: qty 1

## 2018-04-16 MED ORDER — ISOSORBIDE MONONITRATE ER 60 MG PO TB24: 60.0000 mg | ORAL_TABLET | Freq: Every day | ORAL | 0 refills | Status: DC

## 2018-04-16 MED ORDER — FLUTICASONE-UMECLIDIN-VILANT 100-62.5-25 MCG/INH IN AEPB: 1.0000 | INHALATION_SPRAY | Freq: Every day | RESPIRATORY_TRACT | 0 refills | Status: DC

## 2018-04-16 MED ORDER — ALBUTEROL SULFATE HFA 108 (90 BASE) MCG/ACT IN AERS: 1.0000 | INHALATION_SPRAY | Freq: Four times a day (QID) | RESPIRATORY_TRACT | 0 refills | Status: DC | PRN

## 2018-04-16 MED ORDER — METFORMIN HCL 500 MG PO TABS: 500.0000 mg | ORAL_TABLET | Freq: Every day | ORAL | 0 refills | Status: DC

## 2018-04-16 MED ORDER — AMLODIPINE BESYLATE 10 MG PO TABS: 10.0000 mg | ORAL_TABLET | Freq: Every day | ORAL | 0 refills | Status: DC

## 2018-04-16 MED ORDER — CHLORTHALIDONE 25 MG PO TABS: 25.0000 mg | ORAL_TABLET | Freq: Every day | ORAL | 0 refills | Status: DC

## 2018-04-16 NOTE — ED Provider Notes (Signed)
[facility] EMERGENCY DEPARTMENT Provider Note   CSN: [id number] Arrival date & time: 04/16/18  1321    History   Chief Complaint Chief Complaint  Patient presents with  . Chest Pain    HPI Craig Knight is a 53 y.o. male.      HPI Patient presents with multiple complaints.  He is a rambling historian, but states that his primary concern is that he no longer has medication, feels short of breath. He subsequently begins to complain of occasional headache, occasional soreness in his muscles, and ongoing chest discomfort, that is episodic. This last item seems to been present for about 1 week, is inconsistent, not necessarily worse with exertion or inspiration. Is described as tightness, soreness, not necessarily painful. Patient knowledges not taking medication in quite some time, cannot specify why he is failed to do so. He does acknowledge he has multiple medical issues, and typically does take multiple medications for COPD, as well as hypertension. He is here with a family member who provides details for the HPI minimally.    Past Medical History:  Diagnosis Date  . Asthma   . Bronchitis   . COPD (chronic obstructive pulmonary disease) ([place])   . Diabetes mellitus without complication ([place])   . Hypertension     Patient Active Problem List   Diagnosis Date Noted  . Lung nodule 11/17/2017  . Chest pressure 11/22/2016  . Shortness of breath 11/22/2016  . Depression, major, single episode, moderate ([place]) 10/14/2016  . Special screening for malignant neoplasms, colon   . Chronic obstructive pulmonary disease ([place]) 06/08/2016  . Hypertensive emergency 02/16/2016  . Hypertensive urgency 06/07/2015  . CKD (chronic kidney disease), stage III ([place]) 06/07/2015  . Pain in the chest   . Elevated troponin   . Chronic diastolic CHF (congestive heart failure) ([place]) 09/17/2013  . DM type 2 (diabetes mellitus, type 2) ([place]) 09/15/2013  . Chest pain 08/01/2011  . HTN  (hypertension) 08/01/2011  . Chest pain 07/22/2011  . Renal insufficiency 07/22/2011  . Tobacco abuse 07/22/2011  . Hypertension     Past Surgical History:  Procedure Laterality Date  . COLONOSCOPY N/A 09/18/2016   Procedure: COLONOSCOPY;  Surgeon: Danie Binder, MD;  Location: [facility]ITE;  Service: Endoscopy;  Laterality: N/A;  10:30 AM  . lipoma removal          Home Medications    Prior to Admission medications   Medication Sig Start Date End Date Taking? Authorizing Provider  ipratropium-albuterol (DUONEB) 0.5-2.5 (3) MG/3ML SOLN Take 3 mLs by nebulization every 4 (four) hours as needed. 11/20/17  Yes Fenton Foy, NP  albuterol (PROVENTIL HFA;VENTOLIN HFA) 108 (90 Base) MCG/ACT inhaler Inhale 1 puff into the lungs every 6 (six) hours as needed for wheezing or shortness of breath. 04/16/18   Carmin Muskrat, MD  amLODipine (NORVASC) 10 MG tablet Take 1 tablet (10 mg total) by mouth daily for 30 days. 04/16/18 05/16/18  Carmin Muskrat, MD  aspirin EC 81 MG tablet Take 1 tablet (81 mg total) by mouth daily. 04/16/18   Carmin Muskrat, MD  chlorthalidone (HYGROTON) 25 MG tablet Take 1 tablet (25 mg total) by mouth daily for 30 days. 04/16/18 05/16/18  Carmin Muskrat, MD  Fluticasone-Umeclidin-Vilant (TRELEGY ELLIPTA) 100-62.5-25 MCG/INH AEPB Inhale 1 puff into the lungs daily. 04/16/18   Carmin Muskrat, MD  hydrALAZINE (APRESOLINE) 25 MG tablet Take 1 tablet (25 mg total) by mouth daily for 30 days. 04/16/18 05/16/18  Carmin Muskrat, MD  isosorbide mononitrate (IMDUR) 60 MG 24 hr tablet Take 1 tablet (60 mg total) by mouth daily. 04/16/18   Carmin Muskrat, MD  losartan (COZAAR) 25 MG tablet Take 1 tablet (25 mg total) by mouth daily for 30 days. 04/16/18 05/16/18  Carmin Muskrat, MD  metFORMIN (GLUCOPHAGE) 500 MG tablet Take 1 tablet (500 mg total) by mouth daily with breakfast. 04/16/18   Carmin Muskrat, MD  nitroGLYCERIN (NITROSTAT) 0.4 MG SL tablet Place 1 tablet (0.4 mg  total) under the tongue every 5 (five) minutes as needed for chest pain. 04/16/18   Carmin Muskrat, MD    Family History Family History  Problem Relation Age of Onset  . Stroke Mother   . Heart attack Father 13       CABG  . Hypertension Sister   . Stroke Brother   . Hypertension Brother   . Stomach cancer Brother   . Stroke Maternal Grandmother   . Heart attack Maternal Grandfather   . Heart attack Paternal Grandmother     Social History Social History   Tobacco Use  . Smoking status: Current Some Day Smoker    Packs/day: 0.25    Years: 25.00    Pack years: 6.25    Types: Cigarettes  . Smokeless tobacco: Former Systems developer    Quit date: 07/11/2015  . Tobacco comment: 10/11/19only smokes about 1-2 cigarettes per day now  Substance Use Topics  . Alcohol use: Yes    Alcohol/week: 0.0 standard drinks    Comment: occasionally  . Drug use: No     Allergies   Dust mite extract and Lisinopril   Review of Systems Review of Systems  Constitutional:       Per HPI, otherwise negative  HENT:       Per HPI, otherwise negative  Respiratory:       Per HPI, otherwise negative  Cardiovascular:       Per HPI, otherwise negative  Gastrointestinal: Negative for vomiting.  Endocrine:       Negative aside from HPI  Genitourinary:       Neg aside from HPI   Musculoskeletal:       Per HPI, otherwise negative  Skin: Negative.   Neurological: Negative for syncope.     Physical Exam Updated Vital Signs BP (!) 180/102   Pulse 75   Temp 98.2 F (36.8 C) (Oral)   Resp (!) 34   Ht 5\' 7"  (1.702 m)   Wt 95.3 kg   SpO2 98%   BMI 32.89 kg/m   Physical Exam Vitals signs and nursing note reviewed.  Constitutional:      General: He is not in acute distress.    Appearance: He is well-developed.  HENT:     Head: Normocephalic and atraumatic.  Eyes:     Conjunctiva/sclera: Conjunctivae normal.  Cardiovascular:     Rate and Rhythm: Normal rate and regular rhythm.  Pulmonary:      Effort: Pulmonary effort is normal. No respiratory distress.     Breath sounds: No stridor.  Abdominal:     General: There is no distension.  Skin:    General: Skin is warm and dry.  Neurological:     Mental Status: He is alert and oriented to person, place, and time.      ED Treatments / Results  Labs (all labs ordered are listed, but only abnormal results are displayed) Labs Reviewed  BASIC METABOLIC PANEL - Abnormal; Notable for the following components:  Result Value   Glucose, Bld 161 (*)    BUN 23 (*)    Creatinine, Ser 1.57 (*)    GFR calc non Af Amer 50 (*)    GFR calc Af Amer 57 (*)    All other components within normal limits  CBC - Abnormal; Notable for the following components:   WBC 10.9 (*)    RBC 6.18 (*)    All other components within normal limits  TROPONIN I - Abnormal; Notable for the following components:   Troponin I 0.04 (*)    All other components within normal limits    EKG None  Radiology Dg Chest 2 View  Result Date: 04/16/2018 CLINICAL DATA:  Intermittent chest pain with abdominal pain from hernia few months. One week nonproductive cough. EXAM: CHEST - 2 VIEW COMPARISON:  04/21/2017 and 02/16/2016 FINDINGS: Lungs are adequately inflated without focal consolidation or effusion. Cardiomediastinal silhouette and remainder of the exam is unchanged. IMPRESSION: No active cardiopulmonary disease. Electronically Signed   By: Marin Olp M.D.   On: 04/16/2018 13:48    Procedures Procedures (including critical care time)  Medications Ordered in ED Medications  sodium chloride flush (NS) 0.9 % injection 3 mL (3 mLs Intravenous Not Given 04/16/18 1755)  amLODipine (NORVASC) tablet 10 mg (10 mg Oral Given 04/16/18 1756)  chlorthalidone (HYGROTON) tablet 25 mg (25 mg Oral Given 04/16/18 1836)  hydrALAZINE (APRESOLINE) tablet 25 mg (25 mg Oral Given 04/16/18 1756)     Initial Impression / Assessment and Plan / ED Course  I have reviewed the  triage vital signs and the nursing notes.  Pertinent labs & imaging results that were available during my care of the patient were reviewed by me and considered in my medical decision making (see chart for details).       Update: Patient in no distress, states that he feels well. I discussed findings thus far with the patient, including generally consistent labs for him, with persistent demonstration of chronic kidney disease, similarly elevated leukocytosis. EKG abnormal, but not overtly ischemic.  Update:, Patient states that he feels great, has no ongoing pain. Blood pressure has diminished somewhat, though not entirely after provision of multiple doses of his home medication. He, his family member and I had a lengthy conversation about the need for medication compliance, given his history of similar episodes medication noncompliance with similar lab abnormalities including borderline elevated troponin, persistent chronic kidney disease. Patient acknowledges importance of taking his medication, mainly was provided a refill of all of his medication to be picked up tonight. With no ongoing complaints, no chest pain, no dyspnea, no distress, somewhat diminished blood pressure readings, the patient is appropriate for discharge, to obtain his medication, and follow-up with primary care.   Final Clinical Impressions(s) / ED Diagnoses   Final diagnoses:  Hypertensive urgency    ED Discharge Orders         Ordered    albuterol (PROVENTIL HFA;VENTOLIN HFA) 108 (90 Base) MCG/ACT inhaler  Every 6 hours PRN     04/16/18 1856    amLODipine (NORVASC) 10 MG tablet  Daily     04/16/18 1856    aspirin EC 81 MG tablet  Daily     04/16/18 1856    chlorthalidone (HYGROTON) 25 MG tablet  Daily     04/16/18 1856    Fluticasone-Umeclidin-Vilant (TRELEGY ELLIPTA) 100-62.5-25 MCG/INH AEPB  Daily     04/16/18 1856    hydrALAZINE (APRESOLINE) 25 MG tablet  Daily  04/16/18 1856    isosorbide  mononitrate (IMDUR) 60 MG 24 hr tablet  Daily     04/16/18 1856    losartan (COZAAR) 25 MG tablet  Daily     04/16/18 1856    metFORMIN (GLUCOPHAGE) 500 MG tablet  Daily with breakfast     04/16/18 1856    nitroGLYCERIN (NITROSTAT) 0.4 MG SL tablet  Every 5 min PRN     04/16/18 1856           Carmin Muskrat, MD 04/16/18 1944

## 2018-04-16 NOTE — ED Triage Notes (Signed)
Patient reports intermittent chest pain "for a while." Patient also c/o mid abdominal pain from a hernia that has been intermittent for a few months. No emesis or diarrhea but patient reports nausea. Cough for 1 week, non productive.

## 2018-04-16 NOTE — ED Notes (Signed)
CRITICAL VALUE ALERT  Critical Value:  Troponin 0.04  Date & Time Notied:  04/16/18 1747  Provider Notified: Dr. Vanita Panda   Orders Received/Actions taken: EDP notified

## 2018-04-16 NOTE — Discharge Instructions (Signed)
As discussed, it is very important that you take your medication as prescribed.  Similarly it is very important that you follow-up with a primary care physician for appropriate ongoing management of your blood pressure as well as multiple other medical issues. Return here for concerning changes in your condition.

## 2018-06-19 ENCOUNTER — Telehealth: Payer: Self-pay

## 2018-06-19 NOTE — Telephone Encounter (Signed)
Called patient to discuss his upcoming appointment on Wednesday, 06/24/18 with Dr Debara Pickett.  Patient wished to reschedule appointment. Advised it would be August 2020 before office visits would be scheduled. Patient verbalized understanding. Appointment cancelled and rescheduled for 10/12/18 at 8:15p.

## 2018-06-24 ENCOUNTER — Ambulatory Visit: Payer: BLUE CROSS/BLUE SHIELD | Admitting: Internal Medicine

## 2018-09-17 DIAGNOSIS — J449 Chronic obstructive pulmonary disease, unspecified: Secondary | ICD-10-CM | POA: Diagnosis not present

## 2018-09-17 DIAGNOSIS — F17210 Nicotine dependence, cigarettes, uncomplicated: Secondary | ICD-10-CM | POA: Diagnosis not present

## 2018-09-17 DIAGNOSIS — I10 Essential (primary) hypertension: Secondary | ICD-10-CM | POA: Diagnosis not present

## 2018-09-17 DIAGNOSIS — E119 Type 2 diabetes mellitus without complications: Secondary | ICD-10-CM | POA: Diagnosis not present

## 2018-09-17 DIAGNOSIS — G4733 Obstructive sleep apnea (adult) (pediatric): Secondary | ICD-10-CM | POA: Diagnosis not present

## 2018-09-29 ENCOUNTER — Other Ambulatory Visit (HOSPITAL_COMMUNITY): Payer: Self-pay | Admitting: Internal Medicine

## 2018-09-29 ENCOUNTER — Other Ambulatory Visit: Payer: Self-pay

## 2018-09-29 ENCOUNTER — Ambulatory Visit (HOSPITAL_COMMUNITY)
Admission: RE | Admit: 2018-09-29 | Discharge: 2018-09-29 | Disposition: A | Discharge status: Home / Self Care | Payer: BC Managed Care – PPO | Source: Ambulatory Visit | Attending: Internal Medicine | Admitting: Internal Medicine

## 2018-09-29 DIAGNOSIS — J449 Chronic obstructive pulmonary disease, unspecified: Secondary | ICD-10-CM | POA: Insufficient documentation

## 2018-09-29 IMAGING — CR CHEST - 2 VIEW
2 series · 2 of 2 positions shown · non-contrast
Comparison: [DATE], [DATE]

CLINICAL DATA: COPD with hypertension

EXAM:
CHEST - 2 VIEW

[w pa chest]
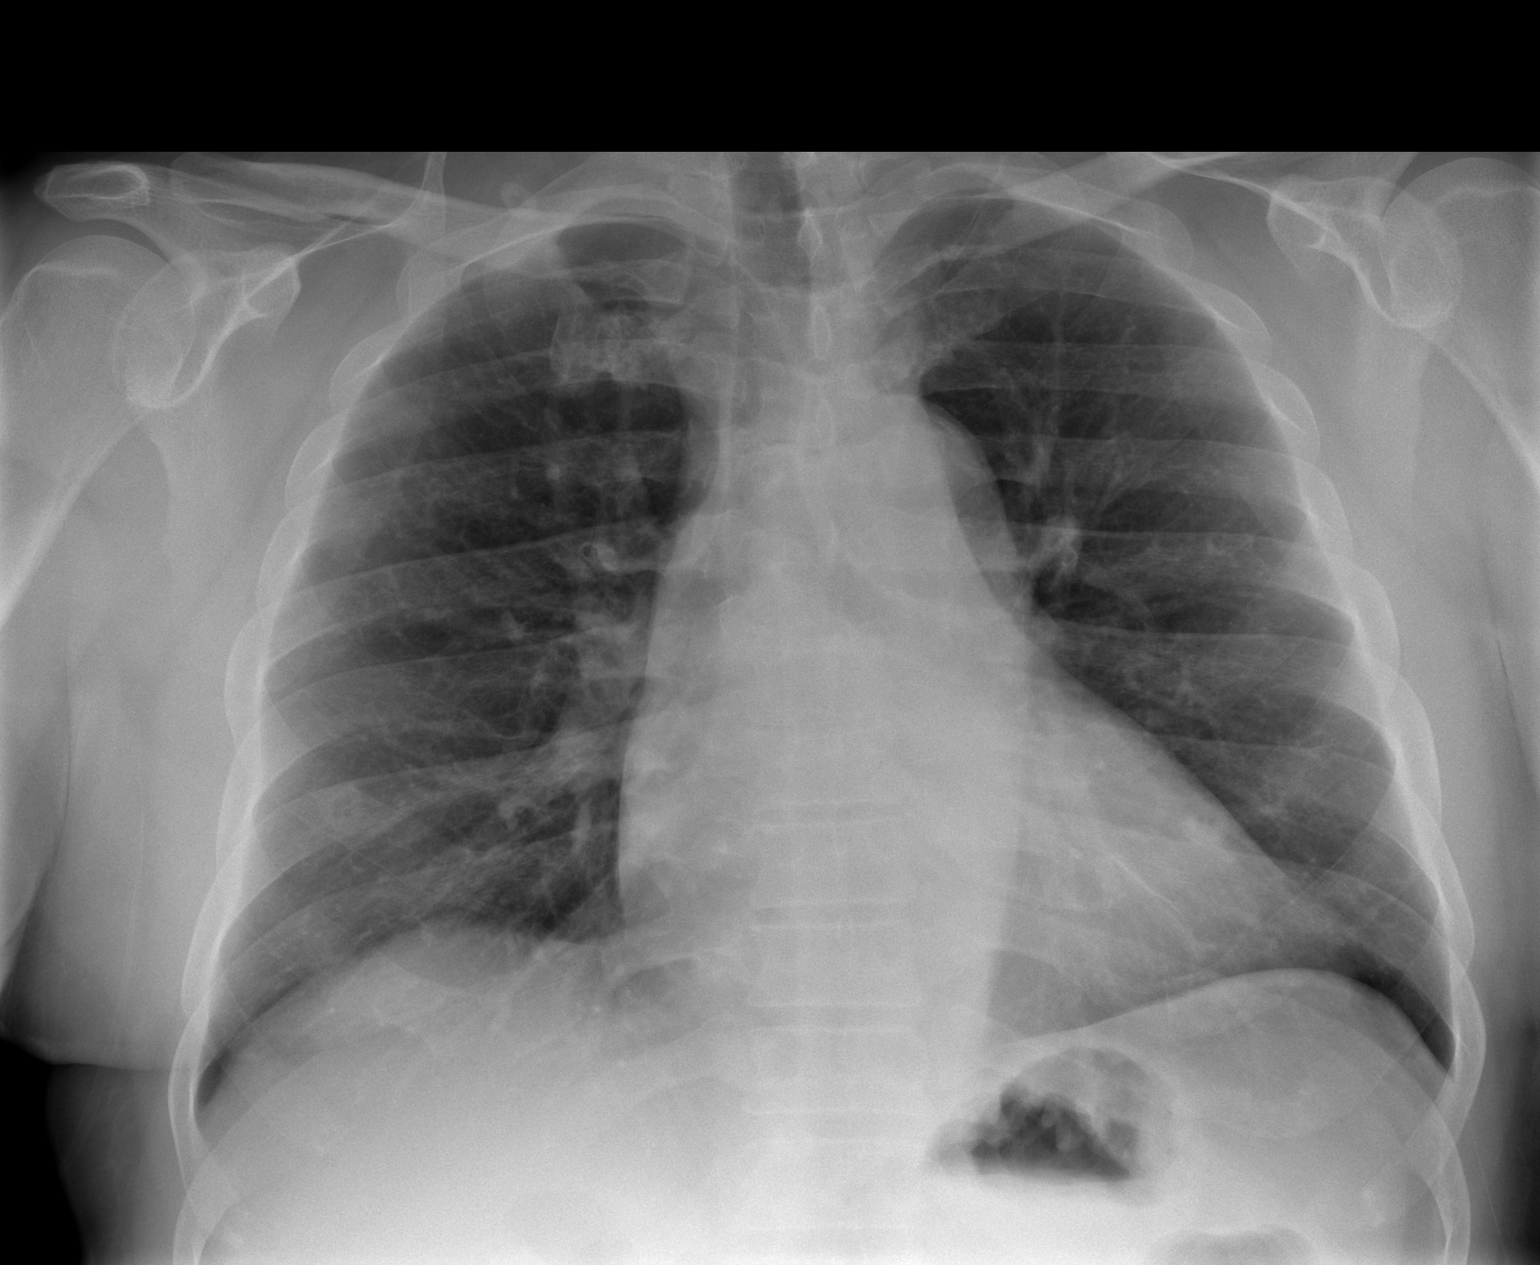

[w chest lat]
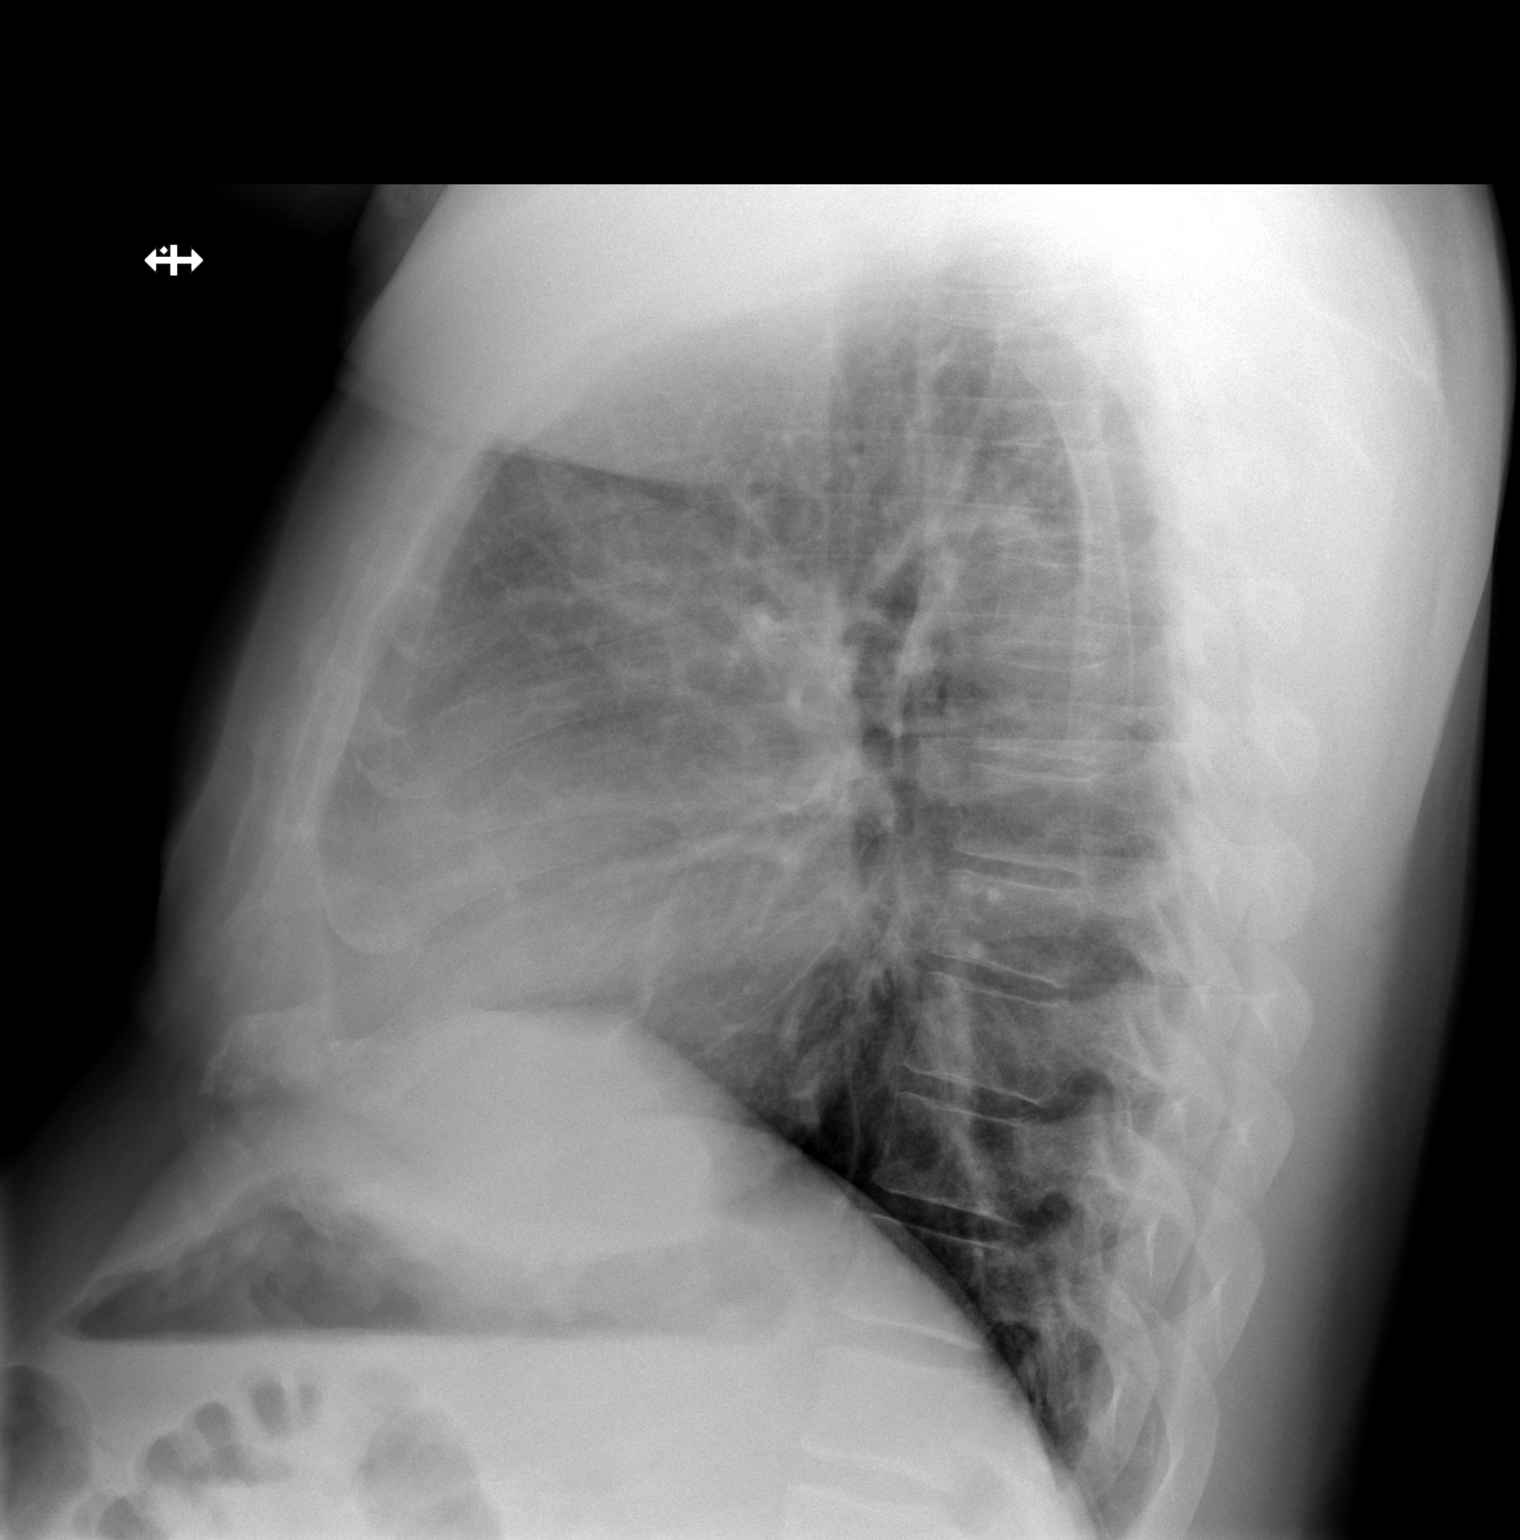

[2 of 2 positions shown; findings below may reference images not displayed]

FINDINGS: No focal opacity or pleural effusion. Stable cardiomediastinal
silhouette. No pneumothorax.
IMPRESSION: No active cardiopulmonary disease.

## 2018-09-30 DIAGNOSIS — I10 Essential (primary) hypertension: Secondary | ICD-10-CM | POA: Diagnosis not present

## 2018-09-30 DIAGNOSIS — J449 Chronic obstructive pulmonary disease, unspecified: Secondary | ICD-10-CM | POA: Diagnosis not present

## 2018-09-30 DIAGNOSIS — E1165 Type 2 diabetes mellitus with hyperglycemia: Secondary | ICD-10-CM | POA: Diagnosis not present

## 2018-10-01 DIAGNOSIS — E119 Type 2 diabetes mellitus without complications: Secondary | ICD-10-CM | POA: Diagnosis not present

## 2018-10-01 DIAGNOSIS — I10 Essential (primary) hypertension: Secondary | ICD-10-CM | POA: Diagnosis not present

## 2018-10-01 DIAGNOSIS — J449 Chronic obstructive pulmonary disease, unspecified: Secondary | ICD-10-CM | POA: Diagnosis not present

## 2018-10-01 DIAGNOSIS — K219 Gastro-esophageal reflux disease without esophagitis: Secondary | ICD-10-CM | POA: Diagnosis not present

## 2018-10-12 ENCOUNTER — Ambulatory Visit: Payer: BC Managed Care – PPO | Admitting: Internal Medicine

## 2018-11-22 ENCOUNTER — Emergency Department (HOSPITAL_COMMUNITY): Payer: Medicare HMO

## 2018-11-22 ENCOUNTER — Other Ambulatory Visit: Payer: Self-pay

## 2018-11-22 ENCOUNTER — Encounter (HOSPITAL_COMMUNITY): Payer: Self-pay

## 2018-11-22 ENCOUNTER — Observation Stay (HOSPITAL_COMMUNITY)
Admission: EM | Admit: 2018-11-22 | Discharge: 2018-11-23 | Disposition: A | Discharge status: Home / Self Care | Payer: Medicare HMO | Attending: Internal Medicine | Admitting: Internal Medicine

## 2018-11-22 DIAGNOSIS — F17210 Nicotine dependence, cigarettes, uncomplicated: Secondary | ICD-10-CM | POA: Insufficient documentation

## 2018-11-22 DIAGNOSIS — E1122 Type 2 diabetes mellitus with diabetic chronic kidney disease: Secondary | ICD-10-CM | POA: Insufficient documentation

## 2018-11-22 DIAGNOSIS — R531 Weakness: Secondary | ICD-10-CM | POA: Diagnosis not present

## 2018-11-22 DIAGNOSIS — I130 Hypertensive heart and chronic kidney disease with heart failure and stage 1 through stage 4 chronic kidney disease, or unspecified chronic kidney disease: Secondary | ICD-10-CM | POA: Insufficient documentation

## 2018-11-22 DIAGNOSIS — I6601 Occlusion and stenosis of right middle cerebral artery: Secondary | ICD-10-CM | POA: Diagnosis not present

## 2018-11-22 DIAGNOSIS — I5032 Chronic diastolic (congestive) heart failure: Secondary | ICD-10-CM | POA: Insufficient documentation

## 2018-11-22 DIAGNOSIS — Z8249 Family history of ischemic heart disease and other diseases of the circulatory system: Secondary | ICD-10-CM | POA: Insufficient documentation

## 2018-11-22 DIAGNOSIS — N183 Chronic kidney disease, stage 3 unspecified: Secondary | ICD-10-CM | POA: Diagnosis present

## 2018-11-22 DIAGNOSIS — G8191 Hemiplegia, unspecified affecting right dominant side: Secondary | ICD-10-CM | POA: Insufficient documentation

## 2018-11-22 DIAGNOSIS — Z20828 Contact with and (suspected) exposure to other viral communicable diseases: Secondary | ICD-10-CM | POA: Diagnosis not present

## 2018-11-22 DIAGNOSIS — Z79899 Other long term (current) drug therapy: Secondary | ICD-10-CM | POA: Insufficient documentation

## 2018-11-22 DIAGNOSIS — I6623 Occlusion and stenosis of bilateral posterior cerebral arteries: Secondary | ICD-10-CM | POA: Diagnosis not present

## 2018-11-22 DIAGNOSIS — I6502 Occlusion and stenosis of left vertebral artery: Secondary | ICD-10-CM | POA: Diagnosis not present

## 2018-11-22 DIAGNOSIS — R200 Anesthesia of skin: Secondary | ICD-10-CM | POA: Diagnosis present

## 2018-11-22 DIAGNOSIS — J449 Chronic obstructive pulmonary disease, unspecified: Secondary | ICD-10-CM | POA: Diagnosis not present

## 2018-11-22 DIAGNOSIS — Z7984 Long term (current) use of oral hypoglycemic drugs: Secondary | ICD-10-CM | POA: Diagnosis not present

## 2018-11-22 DIAGNOSIS — R202 Paresthesia of skin: Secondary | ICD-10-CM | POA: Diagnosis not present

## 2018-11-22 DIAGNOSIS — Z7982 Long term (current) use of aspirin: Secondary | ICD-10-CM | POA: Diagnosis not present

## 2018-11-22 DIAGNOSIS — I639 Cerebral infarction, unspecified: Principal | ICD-10-CM | POA: Insufficient documentation

## 2018-11-22 DIAGNOSIS — I10 Essential (primary) hypertension: Secondary | ICD-10-CM | POA: Diagnosis present

## 2018-11-22 DIAGNOSIS — E119 Type 2 diabetes mellitus without complications: Secondary | ICD-10-CM

## 2018-11-22 LAB — DIFFERENTIAL
Abs Immature Granulocytes: 0.02 10*3/uL (ref 0.00–0.07)
Basophils Absolute: 0.1 10*3/uL (ref 0.0–0.1)
Basophils Relative: 1 %
Eosinophils Absolute: 0.4 10*3/uL (ref 0.0–0.5)
Eosinophils Relative: 3 %
Immature Granulocytes: 0 %
Lymphocytes Relative: 25 %
Lymphs Abs: 3 10*3/uL (ref 0.7–4.0)
Monocytes Absolute: 0.9 10*3/uL (ref 0.1–1.0)
Monocytes Relative: 8 %
Neutro Abs: 7.5 10*3/uL (ref 1.7–7.7)
Neutrophils Relative %: 63 %

## 2018-11-22 LAB — ETHANOL: Alcohol, Ethyl (B): <10 mg/dL (ref <10)

## 2018-11-22 LAB — CBC
HCT: 45.5 % (ref 39.0–52.0)
Hemoglobin: 14.7 g/dL (ref 13.0–17.0)
MCH: 26.3 pg (ref 26.0–34.0)
MCHC: 32.3 g/dL (ref 30.0–36.0)
MCV: 81.3 fL (ref 80.0–100.0)
Platelets: 226 10*3/uL (ref 150–400)
RBC: 5.6 MIL/uL (ref 4.22–5.81)
RDW: 14.3 % (ref 11.5–15.5)
WBC: 11.9 10*3/uL — ABNORMAL HIGH (ref 4.0–10.5)
nRBC: 0 % (ref 0.0–0.2)

## 2018-11-22 LAB — COMPREHENSIVE METABOLIC PANEL
ALT: 21 U/L (ref 0–44)
AST: 16 U/L (ref 15–41)
Albumin: 4 g/dL (ref 3.5–5.0)
Alkaline Phosphatase: 104 U/L (ref 38–126)
Anion gap: 11 (ref 5–15)
BUN: 30 mg/dL — ABNORMAL HIGH (ref 6–20)
CO2: 24 mmol/L (ref 22–32)
Calcium: 9.1 mg/dL (ref 8.9–10.3)
Chloride: 105 mmol/L (ref 98–111)
Creatinine, Ser: 2.05 mg/dL — ABNORMAL HIGH (ref 0.61–1.24)
GFR calc Af Amer: 41 mL/min — ABNORMAL LOW (ref >60)
GFR calc non Af Amer: 36 mL/min — ABNORMAL LOW (ref >60)
Glucose, Bld: 120 mg/dL — ABNORMAL HIGH (ref 70–99)
Potassium: 3.5 mmol/L (ref 3.5–5.1)
Sodium: 140 mmol/L (ref 135–145)
Total Bilirubin: 0.3 mg/dL (ref 0.3–1.2)
Total Protein: 6.9 g/dL (ref 6.5–8.1)

## 2018-11-22 LAB — APTT: aPTT: 27 seconds (ref 24–36)

## 2018-11-22 LAB — PROTIME-INR
INR: 0.9 (ref 0.8–1.2)
Prothrombin Time: 12.3 seconds (ref 11.4–15.2)

## 2018-11-22 IMAGING — CT CT HEAD W/O CM
3 series · 15 of 47 positions shown, 18 images · non-contrast
Comparison: CT head [DATE]

CLINICAL DATA: Right-sided weakness and tingling since [2P] hours

EXAM:
CT HEAD WITHOUT CONTRAST
TECHNIQUE: Contiguous axial images were obtained from the base of the skull
through the vertex without intravenous contrast.

[Series 2: head w o · axial · 0.45mm/px · z∈[+229,+354]mm · 9 of 30 slices shown, 12 images]
[im 3/30  brain]
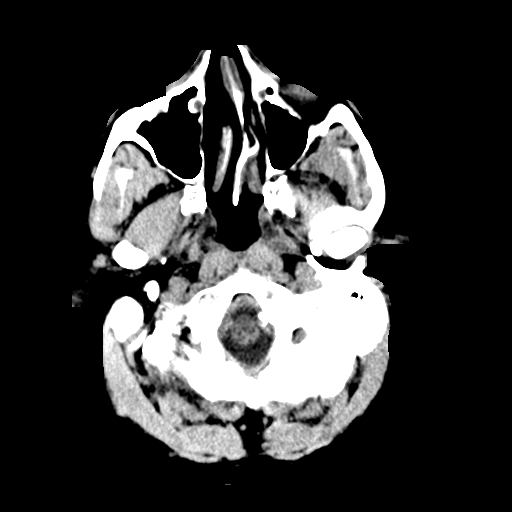
[im 3/30  bone]
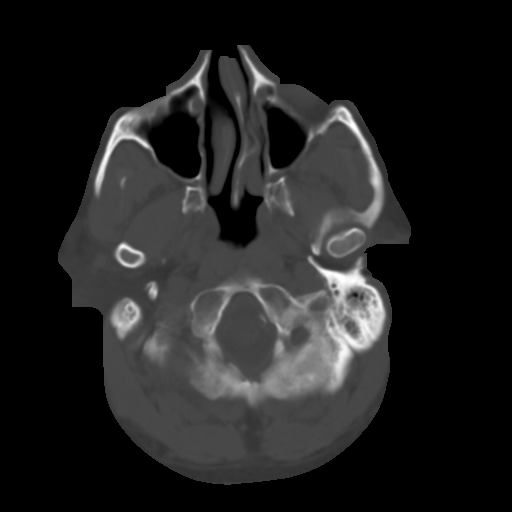
[im 6/30  brain]
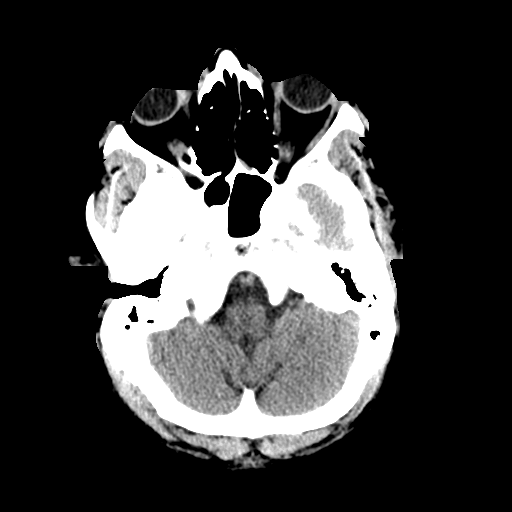
[im 9/30  brain]
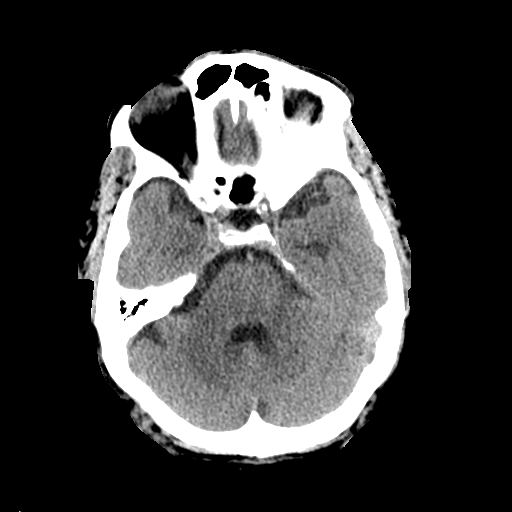
[im 12/30  brain]
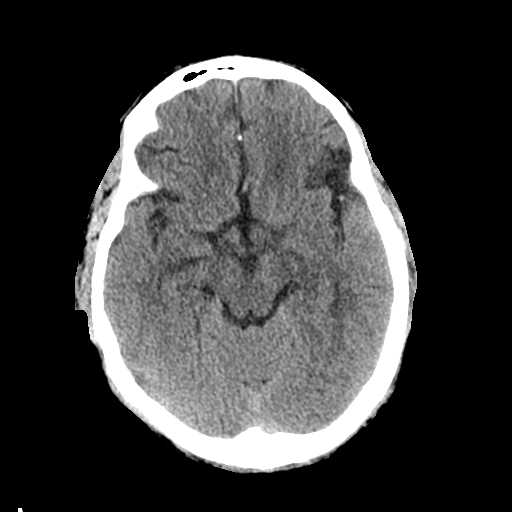
[im 16/30  brain]
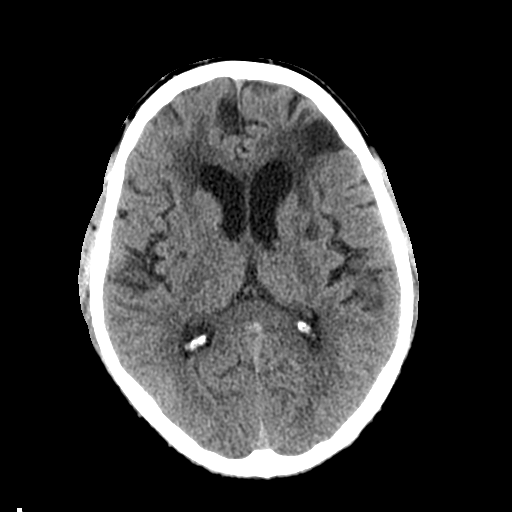
[im 16/30  bone]
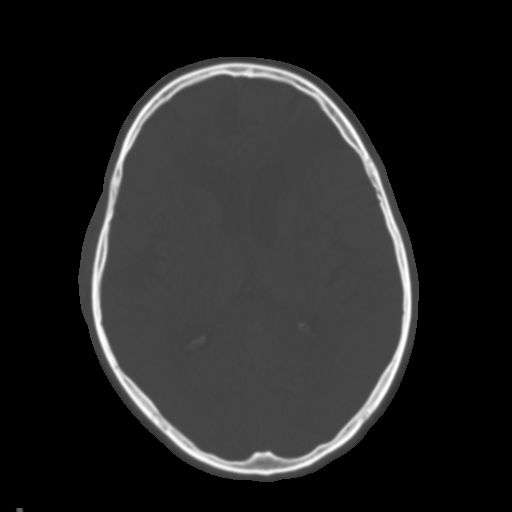
[im 19/30  brain]
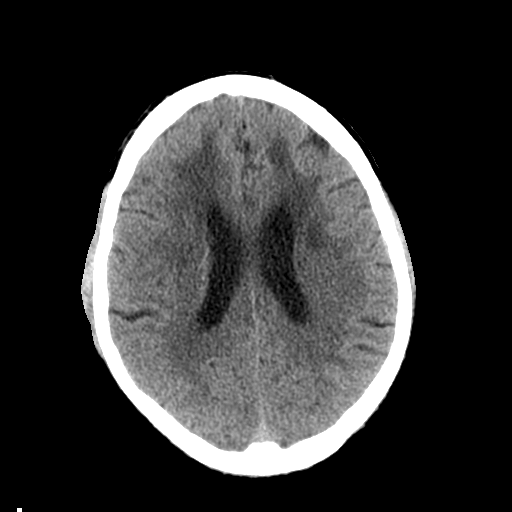
[im 22/30  brain]
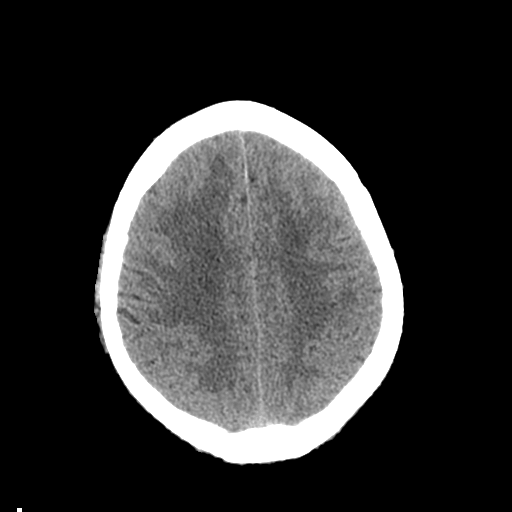
[im 25/30  brain]
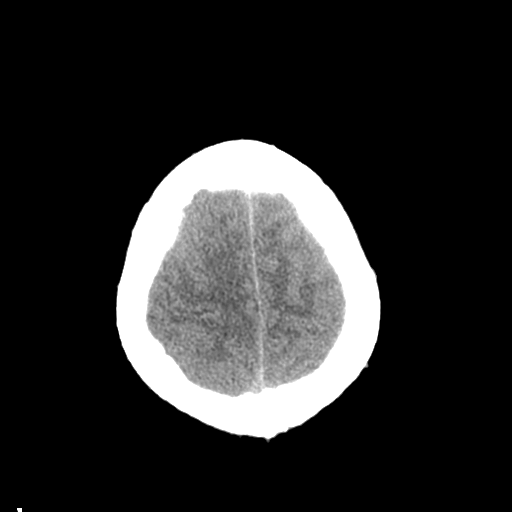
[im 28/30  brain]
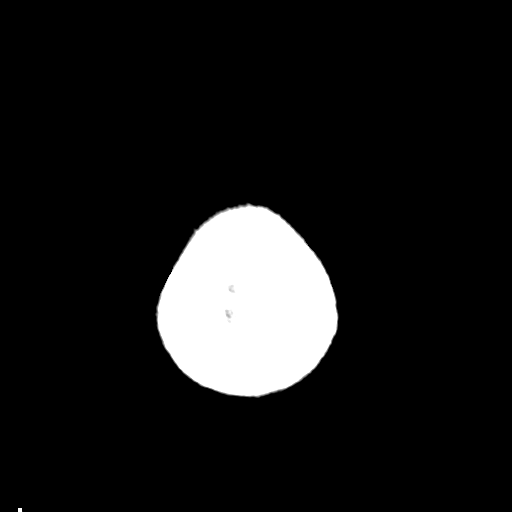
[im 28/30  bone]
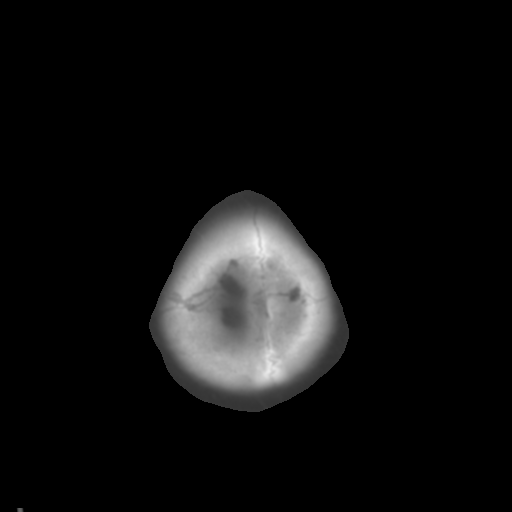

[Series 4: coronal soft · coronal · 0.36mm/px · 3 of 73 slices shown]
[im 25/73  brain]
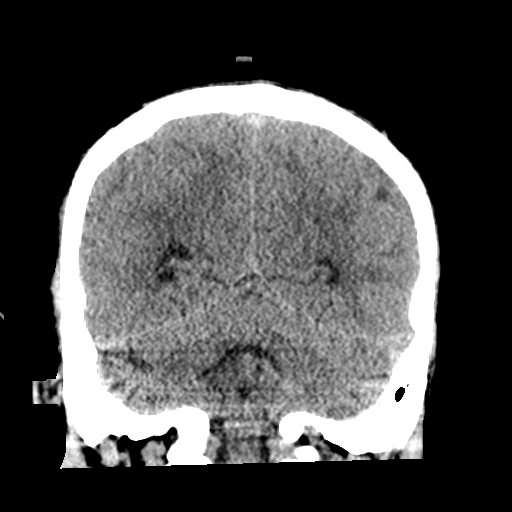
[im 33/73  brain]
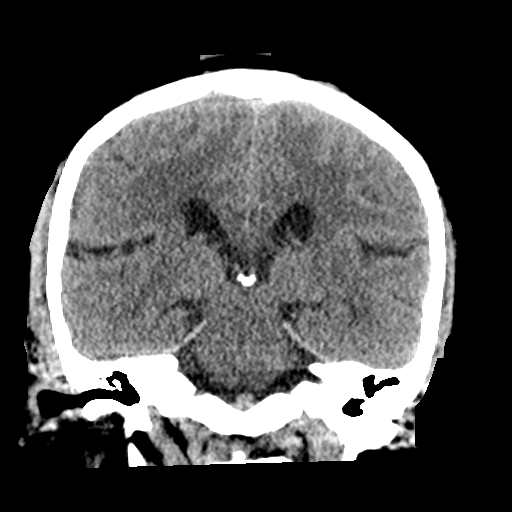
[im 41/73  brain]
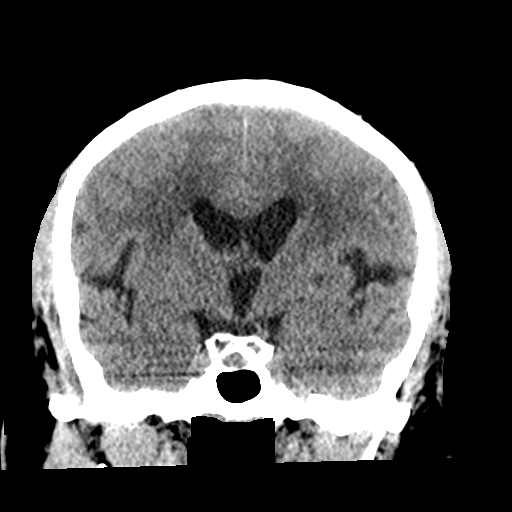

[Series 5: sagittal soft · sagittal · 0.32mm/px · 3 of 67 slices shown]
[im 23/67  brain]
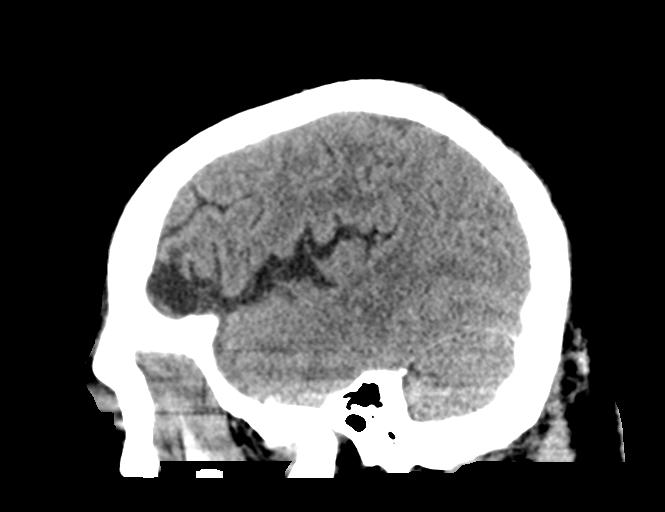
[im 34/67  brain]
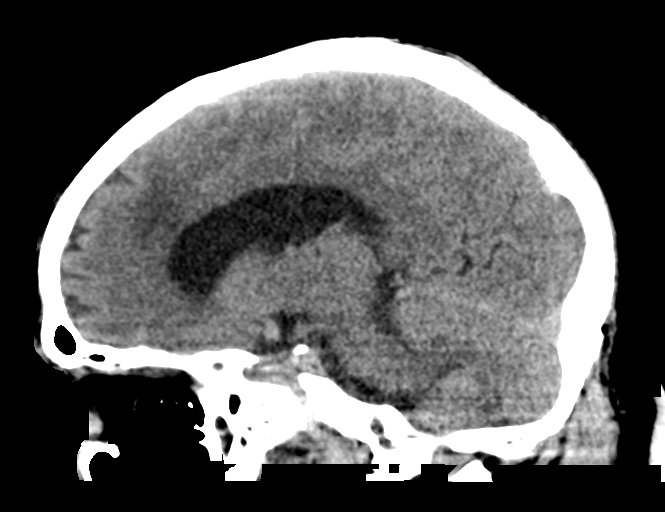
[im 45/67  brain]
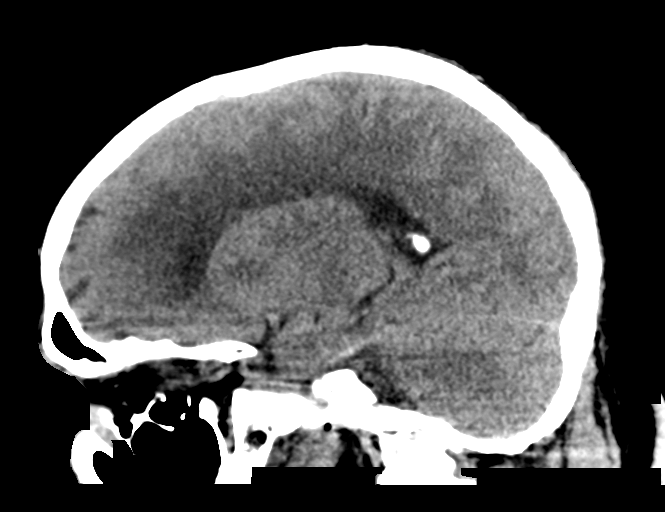

[15 of 47 positions shown; findings below may reference images not displayed]

FINDINGS: Brain: There is a region of gliosis in the left middle frontal gyrus
not present on comparison CT from [DATE]. Extensive
hypoattenuation of the cortex favor subacute to chronic infarct. An
additional remote lacunar infarct was present on the prior
comparison study. No evidence of acute hemorrhage, hydrocephalus, or
extra-axial collection. Patchy areas of white matter hypoattenuation
are most compatible with chronic microvascular angiopathy.

Vascular: Question area of punctate hyperattenuation in a left M2
branch ([DATE]). Calcifications are present in the carotid siphons and
vertebral arteries.

Skull: No calvarial fracture or suspicious osseous lesion. No scalp
swelling or hematoma.

Sinuses/Orbits: Paranasal sinuses and mastoid air cells are
predominantly clear. Included orbital structures are unremarkable.

Other: None
IMPRESSION: 1. Question area of punctate hyperattenuation in a left M2 branch,
which could represent a small amount of thrombus. Could be further
evaluated with CT angiography.
2. Extensive hypoattenuation of the cortex of the left middle
frontal gyrus, though appearance most compatible with subacute to
chronic infarct.
3. Additional remote lacunar infarct in the left corona radiata was
present on the prior comparison study.
[DATE] provide a more sensitive and specific evaluation for
early changes of ischemia.

Critical Value/emergent results were called by telephone at the time
PHIVI , who verbally acknowledged these results.

## 2018-11-22 NOTE — ED Provider Notes (Addendum)
[facility] EMERGENCY DEPARTMENT Provider Note   CSN: [id number] Arrival date & time: 11/22/18  2246     History   Chief Complaint Chief Complaint  Patient presents with   Weakness    HPI Craig Knight is a 53 y.o. male.     Patient presents to the emergency department for evaluation of right-sided numbness and weakness.  Patient reports that symptoms began at 1:30 PM today.  He reports at that time he was having an argument with his wife.  He felt the symptoms begin suddenly after he yelled at his wife.  He reports that he has had tingling of the right side of his face, right arm and right leg ever since.  Symptoms have been waxing and waning but tonight worsened and he feels weak on the right side.  No headache.     Past Medical History:  Diagnosis Date   Asthma    Bronchitis    COPD (chronic obstructive pulmonary disease) ([place])    Diabetes mellitus without complication ([place])    Hypertension     Patient Active Problem List   Diagnosis Date Noted   Stroke ([place]) 11/23/2018   Lung nodule 11/17/2017   Chest pressure 11/22/2016   Shortness of breath 11/22/2016   Depression, major, single episode, moderate (Craig Knight) 10/14/2016   Special screening for malignant neoplasms, colon    Chronic obstructive pulmonary disease ([place]) 06/08/2016   Hypertensive emergency 02/16/2016   Hypertensive urgency 06/07/2015   CKD (chronic kidney disease), stage III ([place]) 06/07/2015   Pain in the chest    Elevated troponin    Chronic diastolic CHF (congestive heart failure) ([place]) 09/17/2013   DM type 2 (diabetes mellitus, type 2) ([place]) 09/15/2013   Chest pain 08/01/2011   HTN (hypertension) 08/01/2011   Chest pain 07/22/2011   Renal insufficiency 07/22/2011   Tobacco abuse 07/22/2011   Hypertension     Past Surgical History:  Procedure Laterality Date   COLONOSCOPY N/A 09/18/2016   Procedure: COLONOSCOPY;  Surgeon: Danie Binder, MD;  Location: [facility];  Service: Endoscopy;  Laterality: N/A;  10:30 AM   lipoma removal          Home Medications    Prior to Admission medications   Medication Sig Start Date End Date Taking? Authorizing Provider  albuterol (PROVENTIL HFA;VENTOLIN HFA) 108 (90 Base) MCG/ACT inhaler Inhale 1 puff into the lungs every 6 (six) hours as needed for wheezing or shortness of breath. 04/16/18   Carmin Muskrat, MD  amLODipine (NORVASC) 10 MG tablet Take 1 tablet (10 mg total) by mouth daily for 30 days. 04/16/18 05/16/18  Carmin Muskrat, MD  aspirin EC 81 MG tablet Take 1 tablet (81 mg total) by mouth daily. 04/16/18   Carmin Muskrat, MD  chlorthalidone (HYGROTON) 25 MG tablet Take 1 tablet (25 mg total) by mouth daily for 30 days. 04/16/18 05/16/18  Carmin Muskrat, MD  Fluticasone-Umeclidin-Vilant (TRELEGY ELLIPTA) 100-62.5-25 MCG/INH AEPB Inhale 1 puff into the lungs daily. 04/16/18   Carmin Muskrat, MD  hydrALAZINE (APRESOLINE) 25 MG tablet Take 1 tablet (25 mg total) by mouth daily for 30 days. 04/16/18 05/16/18  Carmin Muskrat, MD  ipratropium-albuterol (DUONEB) 0.5-2.5 (3) MG/3ML SOLN Take 3 mLs by nebulization every 4 (four) hours as needed. 11/20/17   Fenton Foy, NP  isosorbide mononitrate (IMDUR) 60 MG 24 hr tablet Take 1 tablet (60 mg total) by mouth daily. 04/16/18   Carmin Muskrat, MD  losartan (COZAAR) 25 MG tablet Take  1 tablet (25 mg total) by mouth daily for 30 days. 04/16/18 05/16/18  Carmin Muskrat, MD  metFORMIN (GLUCOPHAGE) 500 MG tablet Take 1 tablet (500 mg total) by mouth daily with breakfast. 04/16/18   Carmin Muskrat, MD  nitroGLYCERIN (NITROSTAT) 0.4 MG SL tablet Place 1 tablet (0.4 mg total) under the tongue every 5 (five) minutes as needed for chest pain. 04/16/18   Carmin Muskrat, MD    Family History Family History  Problem Relation Age of Onset   Stroke Mother    Heart attack Father 36       CABG   Hypertension Sister    Stroke Brother    Hypertension Brother     Stomach cancer Brother    Stroke Maternal Grandmother    Heart attack Maternal Grandfather    Heart attack Paternal Grandmother     Social History Social History   Tobacco Use   Smoking status: Current Some Day Smoker    Packs/day: 0.25    Years: 25.00    Pack years: 6.25    Types: Cigarettes   Smokeless tobacco: Former Systems developer    Quit date: 07/11/2015   Tobacco comment: 10/11/19only smokes about 1-2 cigarettes per day now  Substance Use Topics   Alcohol use: Yes    Alcohol/week: 0.0 standard drinks    Comment: occasionally   Drug use: No     Allergies   Dust mite extract and Lisinopril   Review of Systems Review of Systems  Neurological: Positive for weakness and numbness.  All other systems reviewed and are negative.    Physical Exam Updated Vital Signs BP (!) 141/91    Pulse 62    Temp 98.4 F (36.9 C) (Oral)    Resp 15    Ht 5\' 8"  (1.727 m)    Wt 99.8 kg    SpO2 95%    BMI 33.45 kg/m   Physical Exam Vitals signs and nursing note reviewed.  Constitutional:      General: He is not in acute distress.    Appearance: Normal appearance. He is well-developed.  HENT:     Head: Normocephalic and atraumatic.     Right Ear: Hearing normal.     Left Ear: Hearing normal.     Nose: Nose normal.  Eyes:     Conjunctiva/sclera: Conjunctivae normal.     Pupils: Pupils are equal, round, and reactive to light.  Neck:     Musculoskeletal: Normal range of motion and neck supple.  Cardiovascular:     Rate and Rhythm: Regular rhythm.     Heart sounds: S1 normal and S2 normal. No murmur. No friction rub. No gallop.   Pulmonary:     Effort: Pulmonary effort is normal. No respiratory distress.     Breath sounds: Normal breath sounds.  Chest:     Chest wall: No tenderness.  Abdominal:     General: Bowel sounds are normal.     Palpations: Abdomen is soft.     Tenderness: There is no abdominal tenderness. There is no guarding or rebound. Negative signs include  Murphy's sign and McBurney's sign.     Hernia: No hernia is present.  Musculoskeletal: Normal range of motion.  Skin:    General: Skin is warm and dry.     Findings: No rash.  Neurological:     Mental Status: He is alert and oriented to person, place, and time.     GCS: GCS eye subscore is 4. GCS verbal subscore is 5. GCS  motor subscore is 6.     Sensory: Sensory deficit present.     Motor: Weakness present.     Comments: Right lower extremity strength 3+ out of 5  Right upper extremity strength 4 out of 5 with pronator drift  Decreased sensation right side of face, right arm and right leg  Psychiatric:        Speech: Speech normal.        Behavior: Behavior normal.        Thought Content: Thought content normal.      ED Treatments / Results  Labs (all labs ordered are listed, but only abnormal results are displayed) Labs Reviewed  CBC - Abnormal; Notable for the following components:      Result Value   WBC 11.9 (*)    All other components within normal limits  COMPREHENSIVE METABOLIC PANEL - Abnormal; Notable for the following components:   Glucose, Bld 120 (*)    BUN 30 (*)    Creatinine, Ser 2.05 (*)    GFR calc non Af Amer 36 (*)    GFR calc Af Amer 41 (*)    All other components within normal limits  RAPID URINE DRUG SCREEN, HOSP PERFORMED - Abnormal; Notable for the following components:   Cocaine POSITIVE (*)    All other components within normal limits  URINALYSIS, ROUTINE W REFLEX MICROSCOPIC - Abnormal; Notable for the following components:   Specific Gravity, Urine 1.043 (*)    All other components within normal limits  ETHANOL  PROTIME-INR  APTT  DIFFERENTIAL  I-STAT CHEM 8, ED    EKG EKG Interpretation  Date/Time:  Sunday November 22 2018 23:23:29 EDT Ventricular Rate:  69 PR Interval:    QRS Duration: 86 QT Interval:  386 QTC Calculation: 414 R Axis:   1 Text Interpretation:  Sinus rhythm LVH with secondary repolarization abnormality  Anterior infarct, old No significant change since last tracing Confirmed by Orpah Greek [phone]) on 11/23/2018 1:37:04 AM   Radiology Ct Angio Head W Or Wo Contrast  Result Date: 11/23/2018 CLINICAL DATA:  53 year old male right side weakness and tingling since 1330 hours yesterday. EXAM: CT ANGIOGRAPHY HEAD AND NECK CT PERFUSION BRAIN TECHNIQUE: Multidetector CT imaging of the head and neck was performed using the standard protocol during bolus administration of intravenous contrast. Multiplanar CT image reconstructions and MIPs were obtained to evaluate the vascular anatomy. Carotid stenosis measurements (when applicable) are obtained utilizing NASCET criteria, using the distal internal carotid diameter as the denominator. Multiphase CT imaging of the brain was performed following IV bolus contrast injection. Subsequent parametric perfusion maps were calculated using RAPID software. CONTRAST:  163mL OMNIPAQUE IOHEXOL 350 MG/ML SOLN COMPARISON:  Head CT 11/22/2018. FINDINGS: CT Brain Perfusion Findings: ASPECTS: Estimated at 9; subacute to chronic appearing left MCA anterior division territory infarct with additional age indeterminate hypodensity in the bilateral white matter and basal ganglia. CBF (<30%) Volume: 67mL, corresponding to a portion of the hypodense area at the inferior left frontal gyrus. Perfusion (Tmax>6.0s) volume: 47mL Mismatch Volume: Not applicable Infarction Location:Anterior left MCA as above. CTA NECK Skeleton: No acute osseous abnormality identified. Right side maxillary molar periapical lucency. Upper chest: Enlarged central pulmonary artery on series 6, image 9. Otherwise negative visible upper chest. Other neck: Small subcutaneous fluid collection along the posterior base of the neck on the right series 6, image 73, probably a sebaceous cyst or similar. No other acute findings in the neck. Aortic arch: 3 vessel arch configuration with  no arch atherosclerosis. Right carotid  system: Mildly tortuous brachiocephalic artery. Capacious right CCA without stenosis. Capacious right carotid bifurcation with mild right ICA tortuosity. No plaque or stenosis. Left carotid system: Negative left CCA. Mild calcified plaque at the left ECA origin. Mildly tortuous left ICA with no plaque or stenosis to the skull base. Vertebral arteries: Negative proximal right subclavian artery and right vertebral artery origin. Patent and negative right vertebral artery to the skull base. Negative proximal left subclavian artery and left vertebral artery origin. Patent and negative left vertebral artery to the skull base. CTA HEAD Posterior circulation: Proximal origin of the left PICA which is normal. Bulky soft and calcified plaque of the downstream left V4 segment with moderate stenosis (series 8, image 147). Patent left vertebrobasilar junction. Comparatively mild calcified plaque of the right V4 segment with only mild stenosis. Patent right PICA origin. Fenestrated vertebrobasilar junction (normal variant). Patent basilar artery without stenosis. Normal SCA and PCA origins. Posterior communicating arteries are diminutive or absent. Moderate to severe bilateral PCA P2 segment (P1/P2 junction on the left side) irregularity and stenosis best seen on series 11, image 21. Anterior circulation: Both ICA siphons are patent. On the left there is mild calcified plaque with no stenosis. On the right there is no siphon plaque or stenosis. Normal ophthalmic artery origins. Patent carotid termini. Normal MCA and ACA origins. Partially fenestrated left ACA A1 segment, normal variant. Diminutive anterior communicating artery. Bilateral ACA branches are patent with mild irregularity in the distal right A2 segment on series 13, image 25. Right MCA M1 segment and trifurcation are patent without stenosis. Right MCA branches are patent with mild to moderate irregularity and stenosis in the posterior M3 segments on series 13, image  18. Left MCA M1 and bifurcation are patent without stenosis. Generalized mild irregularity of left MCA branches including the anterior left M2 on series 13, image 33, but no left MCA branch occlusion identified. Venous sinuses: Patent. Dominant appearing left transverse and sigmoid sinuses. Anatomic variants: Fenestrated vertebrobasilar junction. Review of the MIP images confirms the above findings IMPRESSION: 1. CTP suggests a small 4 mL acute infarct core amidst the hypodensity in the anterior left MCA territory seen by plain Head CT yesterday. However, no ischemic penumbra is detected. 2. 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 - bilateral PCAs (moderate to severe). - right MCA posterior M3 (moderate). 3. However, there is minimal extracranial atherosclerosis. 4. Additionally, partially visible central pulmonary artery enlargement raises the possibility of pulmonary artery hypertension. Salient findings discussed by telephone with Dr. Joseph Berkshire on 11/23/2018 at 02:11 . Electronically Signed   By: Genevie Ann M.D.   On: 11/23/2018 02:14   Ct Head Wo Contrast  Result Date: 11/23/2018 CLINICAL DATA:  Right-sided weakness and tingling since 1330 hours EXAM: CT HEAD WITHOUT CONTRAST TECHNIQUE: Contiguous axial images were obtained from the base of the skull through the vertex without intravenous contrast. COMPARISON:  CT head 09/15/2013 FINDINGS: Brain: There is a region of gliosis in the left middle frontal gyrus not present on comparison CT from July 2015. Extensive hypoattenuation of the cortex favor subacute to chronic infarct. An additional remote lacunar infarct was present on the prior comparison study. No evidence of acute hemorrhage, hydrocephalus, or extra-axial collection. Patchy areas of white matter  hypoattenuation are most compatible with chronic microvascular angiopathy.  Vascular: Question area of punctate hyperattenuation in a left M2 branch (2/12). Calcifications are present in the carotid siphons and vertebral arteries. Skull: No calvarial fracture or suspicious osseous lesion. No scalp swelling or hematoma. Sinuses/Orbits: Paranasal sinuses and mastoid air cells are predominantly clear. Included orbital structures are unremarkable. Other: None IMPRESSION: 1. Question area of punctate hyperattenuation in a left M2 branch, which could represent a small amount of thrombus. Could be further evaluated with CT angiography. 2. 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 3. Additional remote lacunar infarct in the left corona radiata was present on the prior comparison study. 4. MRI may provide a more sensitive and specific evaluation for early changes of ischemia. Critical Value/emergent results were called by telephone at the time of interpretation on 11/23/2018 at 12:15 am to providerCHRISTOPHER Ethelbert Thain , who verbally acknowledged these results. Electronically Signed   By: Lovena Le M.D.   On: 11/23/2018 00:18   Ct Angio Neck W And/or Wo Contrast  Result Date: 11/23/2018 CLINICAL DATA:  53 year old male right side weakness and tingling since 1330 hours yesterday. EXAM: CT ANGIOGRAPHY HEAD AND NECK CT PERFUSION BRAIN TECHNIQUE: Multidetector CT imaging of the head and neck was performed using the standard protocol during bolus administration of intravenous contrast. Multiplanar CT image reconstructions and MIPs were obtained to evaluate the vascular anatomy. Carotid stenosis measurements (when applicable) are obtained utilizing NASCET criteria, using the distal internal carotid diameter as the denominator. Multiphase CT imaging of the brain was performed following IV bolus contrast injection. Subsequent  parametric perfusion maps were calculated using RAPID software. CONTRAST:  168mL OMNIPAQUE IOHEXOL 350 MG/ML SOLN COMPARISON:  Head CT 11/22/2018. FINDINGS: CT Brain Perfusion Findings: ASPECTS: Estimated at 9; subacute to chronic appearing left MCA anterior division territory infarct with additional age indeterminate hypodensity in the bilateral white matter and basal ganglia. CBF (<30%) Volume: 52mL, corresponding to a portion of the hypodense area at the inferior left frontal gyrus. Perfusion (Tmax>6.0s) volume: 5mL Mismatch Volume: Not applicable Infarction Location:Anterior left MCA as above. CTA NECK Skeleton: No acute osseous abnormality identified. Right side maxillary molar periapical lucency. Upper chest: Enlarged central pulmonary artery on series 6, image 9. Otherwise negative visible upper chest. Other neck: Small subcutaneous fluid collection along the posterior base of the neck on the right series 6, image 73, probably a sebaceous cyst or similar. No other acute findings in the neck. Aortic arch: 3 vessel arch configuration with no arch atherosclerosis. Right carotid system: Mildly tortuous brachiocephalic artery. Capacious right CCA without stenosis. Capacious right carotid bifurcation with mild right ICA tortuosity. No plaque or stenosis. Left carotid system: Negative left CCA. Mild calcified plaque at the left ECA origin. Mildly tortuous left ICA with no plaque or stenosis to the skull base. Vertebral arteries: Negative proximal right subclavian artery and right vertebral artery origin. Patent and negative right vertebral artery to the skull base. Negative proximal left subclavian artery and left vertebral artery origin. Patent and negative left vertebral artery to the skull base. CTA HEAD Posterior circulation: Proximal origin of the left PICA which is normal. Bulky soft and calcified plaque of the downstream left V4 segment with moderate stenosis (series 8, image 147). Patent left vertebrobasilar  junction. Comparatively mild calcified plaque of the right V4 segment with only mild stenosis. Patent right PICA origin. Fenestrated vertebrobasilar junction (normal variant). Patent basilar artery without stenosis. Normal SCA and PCA origins. Posterior communicating arteries are  diminutive or absent. Moderate to severe bilateral PCA P2 segment (P1/P2 junction on the left side) irregularity and stenosis best seen on series 11, image 21. Anterior circulation: Both ICA siphons are patent. On the left there is mild calcified plaque with no stenosis. On the right there is no siphon plaque or stenosis. Normal ophthalmic artery origins. Patent carotid termini. Normal MCA and ACA origins. Partially fenestrated left ACA A1 segment, normal variant. Diminutive anterior communicating artery. Bilateral ACA branches are patent with mild irregularity in the distal right A2 segment on series 13, image 25. Right MCA M1 segment and trifurcation are patent without stenosis. Right MCA branches are patent with mild to moderate irregularity and stenosis in the posterior M3 segments on series 13, image 18. Left MCA M1 and bifurcation are patent without stenosis. Generalized mild irregularity of left MCA branches including the anterior left M2 on series 13, image 33, but no left MCA branch occlusion identified. Venous sinuses: Patent. Dominant appearing left transverse and sigmoid sinuses. Anatomic variants: Fenestrated vertebrobasilar junction. Review of the MIP images confirms the above findings IMPRESSION: 1. CTP suggests a small 4 mL acute infarct core amidst the hypodensity in the anterior left MCA territory seen by plain Head CT yesterday. However, no ischemic penumbra is detected. 2. 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 - bilateral PCAs  (moderate to severe). - right MCA posterior M3 (moderate). 3. However, there is minimal extracranial atherosclerosis. 4. Additionally, partially visible central pulmonary artery enlargement raises the possibility of pulmonary artery hypertension. Salient findings discussed by telephone with Dr. Joseph Berkshire on 11/23/2018 at 02:11 . Electronically Signed   By: Genevie Ann M.D.   On: 11/23/2018 02:14   Ct Cerebral Perfusion W Contrast  Result Date: 11/23/2018 CLINICAL DATA:  53 year old male right side weakness and tingling since 1330 hours yesterday. EXAM: CT ANGIOGRAPHY HEAD AND NECK CT PERFUSION BRAIN TECHNIQUE: Multidetector CT imaging of the head and neck was performed using the standard protocol during bolus administration of intravenous contrast. Multiplanar CT image reconstructions and MIPs were obtained to evaluate the vascular anatomy. Carotid stenosis measurements (when applicable) are obtained utilizing NASCET criteria, using the distal internal carotid diameter as the denominator. Multiphase CT imaging of the brain was performed following IV bolus contrast injection. Subsequent parametric perfusion maps were calculated using RAPID software. CONTRAST:  14mL OMNIPAQUE IOHEXOL 350 MG/ML SOLN COMPARISON:  Head CT 11/22/2018. FINDINGS: CT Brain Perfusion Findings: ASPECTS: Estimated at 9; subacute to chronic appearing left MCA anterior division territory infarct with additional age indeterminate hypodensity in the bilateral white matter and basal ganglia. CBF (<30%) Volume: 40mL, corresponding to a portion of the hypodense area at the inferior left frontal gyrus. Perfusion (Tmax>6.0s) volume: 58mL Mismatch Volume: Not applicable Infarction Location:Anterior left MCA as above. CTA NECK Skeleton: No acute osseous abnormality identified. Right side maxillary molar periapical lucency. Upper chest: Enlarged central pulmonary artery on series 6, image 9. Otherwise negative visible upper chest. Other neck: Small  subcutaneous fluid collection along the posterior base of the neck on the right series 6, image 73, probably a sebaceous cyst or similar. No other acute findings in the neck. Aortic arch: 3 vessel arch configuration with no arch atherosclerosis. Right carotid system: Mildly tortuous brachiocephalic artery. Capacious right CCA without stenosis. Capacious right carotid bifurcation with mild right ICA tortuosity. No plaque or  stenosis. Left carotid system: Negative left CCA. Mild calcified plaque at the left ECA origin. Mildly tortuous left ICA with no plaque or stenosis to the skull base. Vertebral arteries: Negative proximal right subclavian artery and right vertebral artery origin. Patent and negative right vertebral artery to the skull base. Negative proximal left subclavian artery and left vertebral artery origin. Patent and negative left vertebral artery to the skull base. CTA HEAD Posterior circulation: Proximal origin of the left PICA which is normal. Bulky soft and calcified plaque of the downstream left V4 segment with moderate stenosis (series 8, image 147). Patent left vertebrobasilar junction. Comparatively mild calcified plaque of the right V4 segment with only mild stenosis. Patent right PICA origin. Fenestrated vertebrobasilar junction (normal variant). Patent basilar artery without stenosis. Normal SCA and PCA origins. Posterior communicating arteries are diminutive or absent. Moderate to severe bilateral PCA P2 segment (P1/P2 junction on the left side) irregularity and stenosis best seen on series 11, image 21. Anterior circulation: Both ICA siphons are patent. On the left there is mild calcified plaque with no stenosis. On the right there is no siphon plaque or stenosis. Normal ophthalmic artery origins. Patent carotid termini. Normal MCA and ACA origins. Partially fenestrated left ACA A1 segment, normal variant. Diminutive anterior communicating artery. Bilateral ACA branches are patent with mild  irregularity in the distal right A2 segment on series 13, image 25. Right MCA M1 segment and trifurcation are patent without stenosis. Right MCA branches are patent with mild to moderate irregularity and stenosis in the posterior M3 segments on series 13, image 18. Left MCA M1 and bifurcation are patent without stenosis. Generalized mild irregularity of left MCA branches including the anterior left M2 on series 13, image 33, but no left MCA branch occlusion identified. Venous sinuses: Patent. Dominant appearing left transverse and sigmoid sinuses. Anatomic variants: Fenestrated vertebrobasilar junction. Review of the MIP images confirms the above findings IMPRESSION: 1. CTP suggests a small 4 mL acute infarct core amidst the hypodensity in the anterior left MCA territory seen by plain Head CT yesterday. However, no ischemic penumbra is detected. 2. 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 - bilateral PCAs (moderate to severe). - right MCA posterior M3 (moderate). 3. However, there is minimal extracranial atherosclerosis. 4. Additionally, partially visible central pulmonary artery enlargement raises the possibility of pulmonary artery hypertension. Salient findings discussed by telephone with Dr. Joseph Berkshire on 11/23/2018 at 02:11 . Electronically Signed   By: Genevie Ann M.D.   On: 11/23/2018 02:14    Procedures Procedures (including critical care time)  Medications Ordered in ED Medications  iohexol (OMNIPAQUE) 350 MG/ML injection 120 mL (120 mLs Intravenous Contrast Given 11/23/18 0123)     Initial Impression / Assessment and Plan / ED Course  I have reviewed the triage vital signs and the nursing notes.  Pertinent labs & imaging results that were available during my care of the patient were reviewed by me  and considered in my medical decision making (see chart for details).        Patient presents to the emergency department for evaluation of right-sided weakness with numbness.  Symptoms began approximately 10 hours before arrival in the ER, out of any window for IV thrombolytics.  Examination did reveal mild right hemiparesis.  Screening CT raised concern for possible thrombus in M2 branch.  Further work-up as per tele-neurology consult.  Patient underwent CT angiography and perfusion study.  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  There is concern for small acute stroke in anterior left MCA territory seen on CT perfusion.  Patient will require hospitalization, but there is no need for IR intervention as there is no large vessel occlusion.  Will consult hospitalist.  I did have a lengthy conversation with the patient about his kidney function he had administration of contrast.  Based on the CT findings in conjunction with his exam, there was concern about a possible large vessel occlusion that would be amenable to IR intervention.  I discussed with him the fact that he has an elevated creatinine and IV contrast could worsen this, including requiring long-term hemodialysis.  The benefit of the scan would be determining if he could have an intervention to improve his outcome from suspected stroke.  After he was given all of the risks and benefits and weighed them, he did give consent for IV contrast.  Will give IV fluids.  CRITICAL CARE Performed by: Orpah Greek   Total critical care time: 35 minutes  Critical care time was exclusive of separately billable procedures and treating other patients.  Critical care was necessary to treat or prevent imminent or life-threatening deterioration.  Critical care was time spent personally by me on the following activities: development of treatment plan with patient and/or  surrogate as well as nursing, discussions with consultants, evaluation of patient's response to treatment, examination of patient, obtaining history from patient or surrogate, ordering and performing treatments and interventions, ordering and review of laboratory studies, ordering and review of radiographic studies, pulse oximetry and re-evaluation of patient's condition.   Final Clinical Impressions(s) / ED Diagnoses   Final diagnoses:  Hemiparesis of right dominant side, unspecified hemiparesis etiology [facility])    ED Discharge Orders    None       Ana Woodroof, Gwenyth Allegra, MD 11/23/18 [id number]    Orpah Greek, MD 11/23/18 0300    Orpah Greek, MD 11/23/18 [id number]

## 2018-11-22 NOTE — ED Notes (Signed)
Patient transported to CT 

## 2018-11-22 NOTE — ED Triage Notes (Signed)
Pt has been having right sided weakness with tingling since 130 pm.

## 2018-11-23 ENCOUNTER — Ambulatory Visit (HOSPITAL_COMMUNITY): Payer: Medicare HMO

## 2018-11-23 ENCOUNTER — Observation Stay (HOSPITAL_COMMUNITY): Payer: Medicare HMO

## 2018-11-23 ENCOUNTER — Emergency Department (HOSPITAL_COMMUNITY): Payer: Medicare HMO

## 2018-11-23 DIAGNOSIS — I10 Essential (primary) hypertension: Secondary | ICD-10-CM

## 2018-11-23 DIAGNOSIS — N183 Chronic kidney disease, stage 3 (moderate): Secondary | ICD-10-CM

## 2018-11-23 DIAGNOSIS — I639 Cerebral infarction, unspecified: Secondary | ICD-10-CM | POA: Diagnosis present

## 2018-11-23 DIAGNOSIS — E1122 Type 2 diabetes mellitus with diabetic chronic kidney disease: Secondary | ICD-10-CM | POA: Diagnosis not present

## 2018-11-23 DIAGNOSIS — R200 Anesthesia of skin: Secondary | ICD-10-CM | POA: Diagnosis not present

## 2018-11-23 DIAGNOSIS — I6502 Occlusion and stenosis of left vertebral artery: Secondary | ICD-10-CM | POA: Diagnosis not present

## 2018-11-23 DIAGNOSIS — I6623 Occlusion and stenosis of bilateral posterior cerebral arteries: Secondary | ICD-10-CM | POA: Diagnosis not present

## 2018-11-23 DIAGNOSIS — I6601 Occlusion and stenosis of right middle cerebral artery: Secondary | ICD-10-CM | POA: Diagnosis not present

## 2018-11-23 LAB — COMPREHENSIVE METABOLIC PANEL
ALT: 17 U/L (ref 0–44)
AST: 14 U/L — ABNORMAL LOW (ref 15–41)
Albumin: 3.7 g/dL (ref 3.5–5.0)
Alkaline Phosphatase: 89 U/L (ref 38–126)
Anion gap: 9 (ref 5–15)
BUN: 29 mg/dL — ABNORMAL HIGH (ref 6–20)
CO2: 23 mmol/L (ref 22–32)
Calcium: 8.4 mg/dL — ABNORMAL LOW (ref 8.9–10.3)
Chloride: 106 mmol/L (ref 98–111)
Creatinine, Ser: 1.8 mg/dL — ABNORMAL HIGH (ref 0.61–1.24)
GFR calc Af Amer: 48 mL/min — ABNORMAL LOW (ref >60)
GFR calc non Af Amer: 42 mL/min — ABNORMAL LOW (ref >60)
Glucose, Bld: 112 mg/dL — ABNORMAL HIGH (ref 70–99)
Potassium: 3.8 mmol/L (ref 3.5–5.1)
Sodium: 138 mmol/L (ref 135–145)
Total Bilirubin: 0.5 mg/dL (ref 0.3–1.2)
Total Protein: 6.5 g/dL (ref 6.5–8.1)

## 2018-11-23 LAB — RAPID URINE DRUG SCREEN, HOSP PERFORMED
Amphetamines: NOT DETECTED
Barbiturates: NOT DETECTED
Benzodiazepines: NOT DETECTED
Cocaine: POSITIVE — AB
Opiates: NOT DETECTED
Tetrahydrocannabinol: NOT DETECTED

## 2018-11-23 LAB — URINALYSIS, ROUTINE W REFLEX MICROSCOPIC
Bilirubin Urine: NEGATIVE
Glucose, UA: NEGATIVE mg/dL
Hgb urine dipstick: NEGATIVE
Ketones, ur: NEGATIVE mg/dL
Leukocytes,Ua: NEGATIVE
Nitrite: NEGATIVE
Protein, ur: NEGATIVE mg/dL
Specific Gravity, Urine: 1.043 — ABNORMAL HIGH (ref 1.005–1.030)
pH: 5 (ref 5.0–8.0)

## 2018-11-23 LAB — LIPID PANEL
Cholesterol: 145 mg/dL (ref 0–200)
HDL: 27 mg/dL — ABNORMAL LOW (ref >40)
LDL Cholesterol: 99 mg/dL (ref 0–99)
Total CHOL/HDL Ratio: 5.4 RATIO
Triglycerides: 93 mg/dL (ref <150)
VLDL: 19 mg/dL (ref 0–40)

## 2018-11-23 LAB — HEMOGLOBIN A1C
Hgb A1c MFr Bld: 6.7 % — ABNORMAL HIGH (ref 4.8–5.6)
Mean Plasma Glucose: 145.59 mg/dL

## 2018-11-23 LAB — CBG MONITORING, ED: Glucose-Capillary: 156 mg/dL — ABNORMAL HIGH (ref 70–99)

## 2018-11-23 LAB — SARS CORONAVIRUS 2 BY RT PCR (HOSPITAL ORDER, PERFORMED IN ~~LOC~~ HOSPITAL LAB): SARS Coronavirus 2: NEGATIVE

## 2018-11-23 MED ORDER — ASPIRIN 325 MG PO TABS
325.0000 mg | ORAL_TABLET | Freq: Every day | ORAL | Status: DC
  Administered 2018-11-23: 11:00:00 325 mg via ORAL
  Filled 2018-11-23: qty 1

## 2018-11-23 MED ORDER — ACETAMINOPHEN 325 MG PO TABS: 650.0000 mg | ORAL_TABLET | ORAL | Status: DC | PRN

## 2018-11-23 MED ORDER — ENOXAPARIN SODIUM 60 MG/0.6ML ~~LOC~~ SOLN: 0.5000 mg/kg | SUBCUTANEOUS | Status: DC

## 2018-11-23 MED ORDER — STROKE: EARLY STAGES OF RECOVERY BOOK
Freq: Once | Status: DC
  Filled 2018-11-23: qty 1

## 2018-11-23 MED ORDER — ACETAMINOPHEN 650 MG RE SUPP: 650.0000 mg | RECTAL | Status: DC | PRN

## 2018-11-23 MED ORDER — ACETAMINOPHEN 160 MG/5ML PO SOLN: 650.0000 mg | ORAL | Status: DC | PRN

## 2018-11-23 MED ORDER — ALBUTEROL SULFATE (2.5 MG/3ML) 0.083% IN NEBU: 3.0000 mL | INHALATION_SOLUTION | Freq: Four times a day (QID) | RESPIRATORY_TRACT | Status: DC | PRN

## 2018-11-23 MED ORDER — INSULIN ASPART 100 UNIT/ML ~~LOC~~ SOLN: 0.0000 [IU] | Freq: Every day | SUBCUTANEOUS | Status: DC

## 2018-11-23 MED ORDER — IOHEXOL 350 MG/ML SOLN
120.0000 mL | Freq: Once | INTRAVENOUS | Status: AC | PRN
  Administered 2018-11-23: 01:00:00 120 mL via INTRAVENOUS

## 2018-11-23 MED ORDER — ATORVASTATIN CALCIUM 40 MG PO TABS: 40.0000 mg | ORAL_TABLET | Freq: Every day | ORAL | 11 refills | Status: DC

## 2018-11-23 MED ORDER — ENOXAPARIN SODIUM 40 MG/0.4ML ~~LOC~~ SOLN
40.0000 mg | SUBCUTANEOUS | Status: DC
  Administered 2018-11-23: 11:00:00 40 mg via SUBCUTANEOUS
  Filled 2018-11-23: qty 0.4

## 2018-11-23 MED ORDER — CLOPIDOGREL BISULFATE 75 MG PO TABS: 75.0000 mg | ORAL_TABLET | Freq: Every day | ORAL | 11 refills | Status: AC

## 2018-11-23 MED ORDER — ASPIRIN 300 MG RE SUPP: 300.0000 mg | Freq: Every day | RECTAL | Status: DC

## 2018-11-23 MED ORDER — INSULIN ASPART 100 UNIT/ML ~~LOC~~ SOLN: 0.0000 [IU] | Freq: Three times a day (TID) | SUBCUTANEOUS | Status: DC

## 2018-11-23 MED ORDER — POTASSIUM CHLORIDE IN NACL 20-0.9 MEQ/L-% IV SOLN
INTRAVENOUS | Status: DC
  Administered 2018-11-23: 06:00:00 via INTRAVENOUS

## 2018-11-23 MED ORDER — SODIUM CHLORIDE 0.9 % IV BOLUS
1000.0000 mL | Freq: Once | INTRAVENOUS | Status: AC
  Administered 2018-11-23: 04:00:00 1000 mL via INTRAVENOUS

## 2018-11-23 NOTE — Consult Note (Addendum)
CTA H/N neg for LVO, no role for NIR.   [facility] TeleNeurology Consult Services  Stat Consult  Date of Service:   11/23/2018 00:48:06  Impression:     .  Rule Out Acute Ischemic Stroke  Comments/Sign-Out: Cr 2 - if ok to do CTA per radiology protocol would recommend CTA H/N and CTP If not safe, admit for stroke workup - telemetry, neurochecks, MRI, TTE, check lipid, statin, antiplatelet, PT/OT/ST, inpt Neuro consult  CT HEAD:  IMPRESSION: 1. Question area of punctate hyperattenuation in a left M2 branch, which could represent a small amount of thrombus. Could be further evaluated with CT angiography. 2. 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 3. Additional remote lacunar infarct in the left corona radiata was present on the prior comparison study. 4. MRI may provide a more sensitive and specific evaluation for early changes of ischemia.  Metrics: [facility] Notification Time: 11/23/2018 00:46:42 Stamp Time: 11/23/2018 00:48:06 Callback Response Time: 11/23/2018 00:53:06 Video Start Time: 11/23/2018 00:57:52  Our recommendations are outlined below.  Recommendations:     .  Antiplatelet Therapy  Imaging Studies:     .  MRI Head  Therapies:     .  Physical Therapy, Occupational Therapy, Speech Therapy Assessment When Applicable  Disposition: Neurology Follow Up Recommended  Sign Out:     .  Discussed with Emergency Department Provider  ----------------------------------------------------------------------------------------------------  Chief Complaint: R sided weakness  History of Present Illness: Patient is a 53 year old Male.  53 yo M h/o HTN, DM, HL, tobacco use p/w R sided weakness, last normal 1330.   Past Medical History:     . Hypertension     . Diabetes Mellitus     . Hyperlipidemia   Antiplatelet use:  Examination: BP(158/100),  Pulse(120), Blood Glucose(69) 1A: Level of Consciousness - Alert; keenly responsive + 0 1B: Ask Month and Age - Both Questions Right + 0 1C: Blink Eyes & Squeeze Hands - Performs Both Tasks + 0 2: Test Horizontal Extraocular Movements - Normal + 0 3: Test Visual Fields - No Visual Loss + 0 4: Test Facial Palsy (Use Grimace if Obtunded) - Normal symmetry + 0 5A: Test Left Arm Motor Drift - No Drift for 10 Seconds + 0 5B: Test Right Arm Motor Drift - Drift, but doesn't hit bed + 1 6A: Test Left Leg Motor Drift - No Drift for 5 Seconds + 0 6B: Test Right Leg Motor Drift - Drift, but doesn't hit bed + 1 7: Test Limb Ataxia (FNF/Heel-Shin) - No Ataxia + 0 8: Test Sensation - Mild-Moderate Loss: Less Sharp/More Dull + 1 9: Test Language/Aphasia - Normal; No aphasia + 0 10: Test Dysarthria - Normal + 0 11: Test Extinction/Inattention - No abnormality + 0  NIHSS Score: 3  Patient/Family was informed the Neurology Consult would happen via TeleHealth consult by way of interactive audio and video telecommunications and consented to receiving care in this manner.  Due to the immediate potential for life-threatening deterioration due to underlying acute neurologic illness, I spent 30 minutes providing critical care. This time includes time for face to face visit via telemedicine, review of medical records, imaging studies and discussion of findings with providers, the patient and/or family.   Dr Launa Grill   [facility] [phone]   Case [id number]

## 2018-11-23 NOTE — Discharge Summary (Signed)
Physician Discharge Summary  KUSH MAULLER [id number] DOB: [birth year] DOA: 11/22/2018  PCP: Rosita Fire, MD  Admit date: 11/22/2018  Discharge date: 11/23/2018  Admitted From:Home  Disposition:  Home  Recommendations for Outpatient Follow-up:  1. Follow up with PCP in 1-2 weeks 2. Follow-up with Dr. Merlene Laughter with neurology in 2 weeks as recommended 3. Remain on aspirin, Plavix, and statin as prescribed as well as other home medications as prior 4. Patient had refused brain MRI  Home Health: None  Equipment/Devices: None  Discharge Condition: Stable  CODE STATUS: Full  Diet recommendation: Heart Healthy/carb modified  Brief/Interim Summary: Per HPI:  Craig Knight  is a 53 y.o. male,  w hypertension, Dm2,  CKD stage3,  ???Chronic diastolic CHF (EF 123456),  Smoker, asthma, apparently presents with c/o numnbess right arm since 1pm 11/22/2018.   Patient was admitted for evaluation of possible CVA and appears to have had a small 4 mm infarct in the left anterior MCA territory seen on cerebral perfusion scan.  No other acute findings noted on CT angiogram at the moment.  He was scheduled to have a brain MRI as recommended by neurology, but would not cooperate with having the MRI done.  I have offered Ativan as well as other sedating agents to help him through the procedure, but he outright refuses to have the procedure done.  I have discussed the case with Dr. Merlene Laughter of neurology who agrees that patient would be appropriate for discharge on aspirin and Plavix which I have initiated along with atorvastatin given LDL of 99.  He is certainly stable for discharge and would like to go home at this point and agrees to follow-up with Dr. Merlene Laughter in 2 weeks.  No other acute events noted throughout the course of this admission.  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  Discharge Instructions  Discharge Instructions    Diet - low sodium heart healthy   Complete by: As directed    Increase activity slowly   Complete by: As directed      Allergies as of 11/23/2018      Reactions   Dust Mite Extract    Lisinopril Cough      Medication List    TAKE these medications   albuterol 108 (90 Base) MCG/ACT inhaler Commonly known as: VENTOLIN HFA Inhale 1 puff into the lungs every 6 (six) hours as needed for wheezing or shortness of breath.   amLODipine 10 MG tablet Commonly known as: NORVASC Take 1 tablet (10 mg total) by mouth daily for 30 days.   aspirin EC 81 MG tablet Take 1 tablet (81 mg total) by mouth daily.   atorvastatin 40 MG tablet Commonly known as: Lipitor Take 1 tablet (40 mg total) by mouth daily.   chlorthalidone 25 MG tablet Commonly known as: HYGROTON Take 1 tablet (25 mg total) by mouth daily for 30 days.   clopidogrel 75 MG tablet Commonly known as: Plavix Take 1 tablet (75 mg total) by mouth daily.   Fluticasone-Umeclidin-Vilant 100-62.5-25 MCG/INH Aepb Commonly known as: Trelegy Ellipta Inhale 1 puff into the lungs daily.   hydrALAZINE 25 MG tablet Commonly known as: APRESOLINE Take 1 tablet (25 mg total)  by mouth daily for 30 days.   hydrALAZINE 50 MG tablet Commonly known as: APRESOLINE Take 50 mg by mouth 2 (two) times daily.   ipratropium-albuterol 0.5-2.5 (3) MG/3ML Soln Commonly known as: DUONEB Take 3 mLs by nebulization every 4 (four) hours as needed.   isosorbide mononitrate 60 MG 24 hr tablet Commonly known as: IMDUR Take 1 tablet (60 mg total) by mouth daily.   losartan 25 MG tablet Commonly known as: COZAAR Take 1 tablet (25 mg total) by mouth daily for 30 days.   losartan 50 MG tablet Commonly known as:  COZAAR Take 50 mg by mouth daily.   metFORMIN 500 MG tablet Commonly known as: GLUCOPHAGE Take 1 tablet (500 mg total) by mouth daily with breakfast.   nitroGLYCERIN 0.4 MG SL tablet Commonly known as: NITROSTAT Place 1 tablet (0.4 mg total) under the tongue every 5 (five) minutes as needed for chest pain.   omeprazole 40 MG capsule Commonly known as: PRILOSEC Take 40 mg by mouth daily.      Follow-up Information    Rosita Fire, MD Follow up in 1 week(s).   Specialty: Internal Medicine Contact information: [place] [address] [phone]        Pixie Casino, MD .   Specialty: Cardiology Contact information: [place] [address] [phone]        Phillips Odor, MD. Schedule an appointment as soon as possible for a visit in 2 week(s).   Specialty: Neurology Contact information: [address] [place] [address] [phone]          Allergies  Allergen Reactions  . Dust Mite Extract   . Lisinopril Cough    Consultations:  Spoke with Dr. Merlene Laughter on phone   Procedures/Studies: Ct Angio Head W Or Wo Contrast  Result Date: 11/23/2018 CLINICAL DATA:  53 year old male right side weakness and tingling since 1330 hours yesterday. EXAM: CT ANGIOGRAPHY HEAD AND NECK CT PERFUSION BRAIN TECHNIQUE: Multidetector CT imaging of the head and neck was performed using the standard protocol during bolus administration of intravenous contrast. Multiplanar CT image reconstructions and MIPs were obtained to evaluate the vascular anatomy. Carotid stenosis measurements (when applicable) are obtained utilizing NASCET criteria, using the distal internal carotid diameter as the denominator. Multiphase CT imaging of the brain was performed following IV bolus contrast injection. Subsequent parametric perfusion maps were calculated using RAPID software. CONTRAST:  132mL OMNIPAQUE IOHEXOL 350 MG/ML SOLN  COMPARISON:  Head CT 11/22/2018. FINDINGS: CT Brain Perfusion Findings: ASPECTS: Estimated at 9; subacute to chronic appearing left MCA anterior division territory infarct with additional age indeterminate hypodensity in the bilateral white matter and basal ganglia. CBF (<30%) Volume: 81mL, corresponding to a portion of the hypodense area at the inferior left frontal gyrus. Perfusion (Tmax>6.0s) volume: 44mL Mismatch Volume: Not applicable Infarction Location:Anterior left MCA as above. CTA NECK Skeleton: No acute osseous abnormality identified. Right side maxillary molar periapical lucency. Upper chest: Enlarged central pulmonary artery on series 6, image 9. Otherwise negative visible upper chest. Other neck: Small subcutaneous fluid collection along the posterior base of the neck on the right series 6, image 73, probably a sebaceous cyst or similar. No other acute findings in the neck. Aortic arch: 3 vessel arch configuration with no arch atherosclerosis. Right carotid system: Mildly tortuous brachiocephalic artery. Capacious right CCA without stenosis. Capacious right carotid bifurcation with mild right ICA tortuosity. No plaque or stenosis. Left carotid system: Negative left CCA. Mild  calcified plaque at the left ECA origin. Mildly tortuous left ICA with no plaque or stenosis to the skull base. Vertebral arteries: Negative proximal right subclavian artery and right vertebral artery origin. Patent and negative right vertebral artery to the skull base. Negative proximal left subclavian artery and left vertebral artery origin. Patent and negative left vertebral artery to the skull base. CTA HEAD Posterior circulation: Proximal origin of the left PICA which is normal. Bulky soft and calcified plaque of the downstream left V4 segment with moderate stenosis (series 8, image 147). Patent left vertebrobasilar junction. Comparatively mild calcified plaque of the right V4 segment with only mild stenosis. Patent right PICA  origin. Fenestrated vertebrobasilar junction (normal variant). Patent basilar artery without stenosis. Normal SCA and PCA origins. Posterior communicating arteries are diminutive or absent. Moderate to severe bilateral PCA P2 segment (P1/P2 junction on the left side) irregularity and stenosis best seen on series 11, image 21. Anterior circulation: Both ICA siphons are patent. On the left there is mild calcified plaque with no stenosis. On the right there is no siphon plaque or stenosis. Normal ophthalmic artery origins. Patent carotid termini. Normal MCA and ACA origins. Partially fenestrated left ACA A1 segment, normal variant. Diminutive anterior communicating artery. Bilateral ACA branches are patent with mild irregularity in the distal right A2 segment on series 13, image 25. Right MCA M1 segment and trifurcation are patent without stenosis. Right MCA branches are patent with mild to moderate irregularity and stenosis in the posterior M3 segments on series 13, image 18. Left MCA M1 and bifurcation are patent without stenosis. Generalized mild irregularity of left MCA branches including the anterior left M2 on series 13, image 33, but no left MCA branch occlusion identified. Venous sinuses: Patent. Dominant appearing left transverse and sigmoid sinuses. Anatomic variants: Fenestrated vertebrobasilar junction. Review of the MIP images confirms the above findings IMPRESSION: 1. CTP suggests a small 4 mL acute infarct core amidst the hypodensity in the anterior left MCA territory seen by plain Head CT yesterday. However, no ischemic penumbra is detected. 2. 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 - bilateral PCAs (moderate to severe). - right MCA posterior M3 (moderate). 3. However, there is minimal extracranial  atherosclerosis. 4. Additionally, partially visible central pulmonary artery enlargement raises the possibility of pulmonary artery hypertension. Salient findings discussed by telephone with Dr. Joseph Berkshire on 11/23/2018 at 02:11 . Electronically Signed   By: Genevie Ann M.D.   On: 11/23/2018 02:14   Ct Head Wo Contrast  Result Date: 11/23/2018 CLINICAL DATA:  Right-sided weakness and tingling since 1330 hours EXAM: CT HEAD WITHOUT CONTRAST TECHNIQUE: Contiguous axial images were obtained from the base of the skull through the vertex without intravenous contrast. COMPARISON:  CT head 09/15/2013 FINDINGS: Brain: There is a region of gliosis in the left middle frontal gyrus not present on comparison CT from July 2015. Extensive hypoattenuation of the cortex favor subacute to chronic infarct. An additional remote lacunar infarct was present on the prior comparison study. No evidence of acute hemorrhage, hydrocephalus, or extra-axial collection. Patchy areas of white matter hypoattenuation are most compatible with chronic microvascular angiopathy. Vascular: Question area of punctate hyperattenuation in a left M2 branch (2/12). Calcifications are present in the carotid siphons and vertebral arteries. Skull: No calvarial fracture or suspicious osseous lesion. No scalp swelling or hematoma. Sinuses/Orbits:  Paranasal sinuses and mastoid air cells are predominantly clear. Included orbital structures are unremarkable. Other: None IMPRESSION: 1. Question area of punctate hyperattenuation in a left M2 branch, which could represent a small amount of thrombus. Could be further evaluated with CT angiography. 2. 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 3. Additional remote lacunar infarct in the left corona radiata was present on the prior comparison study. 4. MRI may provide a more sensitive and specific evaluation for early  changes of ischemia. Critical Value/emergent results were called by telephone at the time of interpretation on 11/23/2018 at 12:15 am to providerCHRISTOPHER POLLINA , who verbally acknowledged these results. Electronically Signed   By: Lovena Le M.D.   On: 11/23/2018 00:18   Ct Angio Neck W And/or Wo Contrast  Result Date: 11/23/2018 CLINICAL DATA:  53 year old male right side weakness and tingling since 1330 hours yesterday. EXAM: CT ANGIOGRAPHY HEAD AND NECK CT PERFUSION BRAIN TECHNIQUE: Multidetector CT imaging of the head and neck was performed using the standard protocol during bolus administration of intravenous contrast. Multiplanar CT image reconstructions and MIPs were obtained to evaluate the vascular anatomy. Carotid stenosis measurements (when applicable) are obtained utilizing NASCET criteria, using the distal internal carotid diameter as the denominator. Multiphase CT imaging of the brain was performed following IV bolus contrast injection. Subsequent parametric perfusion maps were calculated using RAPID software. CONTRAST:  191mL OMNIPAQUE IOHEXOL 350 MG/ML SOLN COMPARISON:  Head CT 11/22/2018. FINDINGS: CT Brain Perfusion Findings: ASPECTS: Estimated at 9; subacute to chronic appearing left MCA anterior division territory infarct with additional age indeterminate hypodensity in the bilateral white matter and basal ganglia. CBF (<30%) Volume: 40mL, corresponding to a portion of the hypodense area at the inferior left frontal gyrus. Perfusion (Tmax>6.0s) volume: 21mL Mismatch Volume: Not applicable Infarction Location:Anterior left MCA as above. CTA NECK Skeleton: No acute osseous abnormality identified. Right side maxillary molar periapical lucency. Upper chest: Enlarged central pulmonary artery on series 6, image 9. Otherwise negative visible upper chest. Other neck: Small subcutaneous fluid collection along the posterior base of the neck on the right series 6, image 73, probably a sebaceous cyst  or similar. No other acute findings in the neck. Aortic arch: 3 vessel arch configuration with no arch atherosclerosis. Right carotid system: Mildly tortuous brachiocephalic artery. Capacious right CCA without stenosis. Capacious right carotid bifurcation with mild right ICA tortuosity. No plaque or stenosis. Left carotid system: Negative left CCA. Mild calcified plaque at the left ECA origin. Mildly tortuous left ICA with no plaque or stenosis to the skull base. Vertebral arteries: Negative proximal right subclavian artery and right vertebral artery origin. Patent and negative right vertebral artery to the skull base. Negative proximal left subclavian artery and left vertebral artery origin. Patent and negative left vertebral artery to the skull base. CTA HEAD Posterior circulation: Proximal origin of the left PICA which is normal. Bulky soft and calcified plaque of the downstream left V4 segment with moderate stenosis (series 8, image 147). Patent left vertebrobasilar junction. Comparatively mild calcified plaque of the right V4 segment with only mild stenosis. Patent right PICA origin. Fenestrated vertebrobasilar junction (normal variant). Patent basilar artery without stenosis. Normal SCA and PCA origins. Posterior communicating arteries are diminutive or absent. Moderate to severe bilateral PCA P2 segment (P1/P2 junction on the left side) irregularity and stenosis best seen on series 11, image 21. Anterior circulation: Both ICA siphons are patent. On the left  there is mild calcified plaque with no stenosis. On the right there is no siphon plaque or stenosis. Normal ophthalmic artery origins. Patent carotid termini. Normal MCA and ACA origins. Partially fenestrated left ACA A1 segment, normal variant. Diminutive anterior communicating artery. Bilateral ACA branches are patent with mild irregularity in the distal right A2 segment on series 13, image 25. Right MCA M1 segment and trifurcation are patent without  stenosis. Right MCA branches are patent with mild to moderate irregularity and stenosis in the posterior M3 segments on series 13, image 18. Left MCA M1 and bifurcation are patent without stenosis. Generalized mild irregularity of left MCA branches including the anterior left M2 on series 13, image 33, but no left MCA branch occlusion identified. Venous sinuses: Patent. Dominant appearing left transverse and sigmoid sinuses. Anatomic variants: Fenestrated vertebrobasilar junction. Review of the MIP images confirms the above findings IMPRESSION: 1. CTP suggests a small 4 mL acute infarct core amidst the hypodensity in the anterior left MCA territory seen by plain Head CT yesterday. However, no ischemic penumbra is detected. 2. 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 - bilateral PCAs (moderate to severe). - right MCA posterior M3 (moderate). 3. However, there is minimal extracranial atherosclerosis. 4. Additionally, partially visible central pulmonary artery enlargement raises the possibility of pulmonary artery hypertension. Salient findings discussed by telephone with Dr. Joseph Berkshire on 11/23/2018 at 02:11 . Electronically Signed   By: Genevie Ann M.D.   On: 11/23/2018 02:14   Ct Cerebral Perfusion W Contrast  Result Date: 11/23/2018 CLINICAL DATA:  53 year old male right side weakness and tingling since 1330 hours yesterday. EXAM: CT ANGIOGRAPHY HEAD AND NECK CT PERFUSION BRAIN TECHNIQUE: Multidetector CT imaging of the head and neck was performed using the standard protocol during bolus administration of intravenous contrast. Multiplanar CT image reconstructions and MIPs were obtained to evaluate the vascular anatomy. Carotid stenosis measurements (when applicable) are obtained utilizing NASCET criteria, using the  distal internal carotid diameter as the denominator. Multiphase CT imaging of the brain was performed following IV bolus contrast injection. Subsequent parametric perfusion maps were calculated using RAPID software. CONTRAST:  144mL OMNIPAQUE IOHEXOL 350 MG/ML SOLN COMPARISON:  Head CT 11/22/2018. FINDINGS: CT Brain Perfusion Findings: ASPECTS: Estimated at 9; subacute to chronic appearing left MCA anterior division territory infarct with additional age indeterminate hypodensity in the bilateral white matter and basal ganglia. CBF (<30%) Volume: 19mL, corresponding to a portion of the hypodense area at the inferior left frontal gyrus. Perfusion (Tmax>6.0s) volume: 25mL Mismatch Volume: Not applicable Infarction Location:Anterior left MCA as above. CTA NECK Skeleton: No acute osseous abnormality identified. Right side maxillary molar periapical lucency. Upper chest: Enlarged central pulmonary artery on series 6, image 9. Otherwise negative visible upper chest. Other neck: Small subcutaneous fluid collection along the posterior base of the neck on the right series 6, image 73, probably a sebaceous cyst or similar. No other acute findings in the neck. Aortic arch: 3 vessel arch configuration with no arch atherosclerosis. Right carotid system: Mildly tortuous brachiocephalic artery. Capacious right CCA without stenosis. Capacious right carotid bifurcation with mild right ICA tortuosity. No plaque or stenosis. Left carotid system: Negative left CCA. Mild calcified plaque at the left ECA origin. Mildly tortuous left ICA with no plaque or stenosis to the skull base. Vertebral arteries: Negative proximal right subclavian artery and  right vertebral artery origin. Patent and negative right vertebral artery to the skull base. Negative proximal left subclavian artery and left vertebral artery origin. Patent and negative left vertebral artery to the skull base. CTA HEAD Posterior circulation: Proximal origin of the left PICA which  is normal. Bulky soft and calcified plaque of the downstream left V4 segment with moderate stenosis (series 8, image 147). Patent left vertebrobasilar junction. Comparatively mild calcified plaque of the right V4 segment with only mild stenosis. Patent right PICA origin. Fenestrated vertebrobasilar junction (normal variant). Patent basilar artery without stenosis. Normal SCA and PCA origins. Posterior communicating arteries are diminutive or absent. Moderate to severe bilateral PCA P2 segment (P1/P2 junction on the left side) irregularity and stenosis best seen on series 11, image 21. Anterior circulation: Both ICA siphons are patent. On the left there is mild calcified plaque with no stenosis. On the right there is no siphon plaque or stenosis. Normal ophthalmic artery origins. Patent carotid termini. Normal MCA and ACA origins. Partially fenestrated left ACA A1 segment, normal variant. Diminutive anterior communicating artery. Bilateral ACA branches are patent with mild irregularity in the distal right A2 segment on series 13, image 25. Right MCA M1 segment and trifurcation are patent without stenosis. Right MCA branches are patent with mild to moderate irregularity and stenosis in the posterior M3 segments on series 13, image 18. Left MCA M1 and bifurcation are patent without stenosis. Generalized mild irregularity of left MCA branches including the anterior left M2 on series 13, image 33, but no left MCA branch occlusion identified. Venous sinuses: Patent. Dominant appearing left transverse and sigmoid sinuses. Anatomic variants: Fenestrated vertebrobasilar junction. Review of the MIP images confirms the above findings IMPRESSION: 1. CTP suggests a small 4 mL acute infarct core amidst the hypodensity in the anterior left MCA territory seen by plain Head CT yesterday. However, no ischemic penumbra is detected. 2. CTA is Negative for large vessel occlusion, but is positive for age advanced intracranial  atherosclerosis, including mild irregularity of left MCA branches, as well as more pronounced stenoses: - left Vertebral artery V4 (moderate). - bilateral PCAs (moderate to severe). - right MCA posterior M3 (moderate). 3. However, there is minimal extracranial atherosclerosis. 4. Additionally, partially visible central pulmonary artery enlargement raises the possibility of pulmonary artery hypertension. Salient findings discussed by telephone with Dr. Joseph Berkshire on 11/23/2018 at 02:11 . Electronically Signed   By: Genevie Ann M.D.   On: 11/23/2018 02:14     Discharge Exam: Vitals:   11/23/18 1100 11/23/18 1200  BP: (!) 176/95 (!) 156/91  Pulse: 61 (!) 57  Resp: 14 17  Temp:    SpO2: 97% 99%   Vitals:   11/23/18 1000 11/23/18 1030 11/23/18 1100 11/23/18 1200  BP: (!) 152/102  (!) 176/95 (!) 156/91  Pulse:  61 61 (!) 57  Resp: 15 17 14 17   Temp:      TempSrc:      SpO2:  98% 97% 99%  Weight:      Height:        General: Pt is alert, awake, not in acute distress Cardiovascular: RRR, S1/S2 +, no rubs, no gallops Respiratory: CTA bilaterally, no wheezing, no rhonchi Abdominal: Soft, NT, ND, bowel sounds + Extremities: no edema, no cyanosis    The results of significant diagnostics from this hospitalization (including imaging, microbiology, ancillary and laboratory) are listed below for reference.     Microbiology: Recent Results (from the past 240 hour(s))  SARS Coronavirus 2 (  Hospital order, Performed in [facility] hospital lab) Nasopharyngeal Nasopharyngeal Swab     Status: None   Collection Time: 11/23/18  3:19 AM   Specimen: Nasopharyngeal Swab  Result Value Ref Range Status   SARS Coronavirus 2 NEGATIVE NEGATIVE Final    Comment: (NOTE) If result is NEGATIVE SARS-CoV-2 target nucleic acids are NOT DETECTED. The SARS-CoV-2 RNA is generally detectable in upper and lower  respiratory specimens during the acute phase of infection. The lowest  concentration of  SARS-CoV-2 viral copies this assay can detect is 250  copies / mL. A negative result does not preclude SARS-CoV-2 infection  and should not be used as the sole basis for treatment or other  patient management decisions.  A negative result may occur with  improper specimen collection / handling, submission of specimen other  than nasopharyngeal swab, presence of viral mutation(s) within the  areas targeted by this assay, and inadequate number of viral copies  (<250 copies / mL). A negative result must be combined with clinical  observations, patient history, and epidemiological information. If result is POSITIVE SARS-CoV-2 target nucleic acids are DETECTED. The SARS-CoV-2 RNA is generally detectable in upper and lower  respiratory specimens dur ing the acute phase of infection.  Positive  results are indicative of active infection with SARS-CoV-2.  Clinical  correlation with patient history and other diagnostic information is  necessary to determine patient infection status.  Positive results do  not rule out bacterial infection or co-infection with other viruses. If result is PRESUMPTIVE POSTIVE SARS-CoV-2 nucleic acids MAY BE PRESENT.   A presumptive positive result was obtained on the submitted specimen  and confirmed on repeat testing.  While 2019 novel coronavirus  (SARS-CoV-2) nucleic acids may be present in the submitted sample  additional confirmatory testing may be necessary for epidemiological  and / or clinical management purposes  to differentiate between  SARS-CoV-2 and other Sarbecovirus currently known to infect humans.  If clinically indicated additional testing with an alternate test  methodology [phone]) is advised. The SARS-CoV-2 RNA is generally  detectable in upper and lower respiratory sp ecimens during the acute  phase of infection. The expected result is Negative. Fact Sheet for Patients:  StrictlyIdeas.no Fact Sheet for Healthcare  Providers: BankingDealers.co.za This test is not yet approved or cleared by the [place] FDA and has been authorized for detection and/or diagnosis of SARS-CoV-2 by FDA under an Emergency Use Authorization (EUA).  This EUA will remain in effect (meaning this test can be used) for the duration of the COVID-19 declaration under Section 564(b)(1) of the Act, 21 U.S.C. section 360bbb-3(b)(1), unless the authorization is terminated or revoked sooner. Performed at [facility], [address] [place], [place] [address]      Labs: BNP (last 3 results) No results for input(s): BNP in the last 8760 hours. Basic Metabolic Panel: Recent Labs  Lab 11/22/18 2316 11/23/18 0558  NA 140 138  K 3.5 3.8  CL 105 106  CO2 24 23  GLUCOSE 120* 112*  BUN 30* 29*  CREATININE 2.05* 1.80*  CALCIUM 9.1 8.4*   Liver Function Tests: Recent Labs  Lab 11/22/18 2316 11/23/18 0558  AST 16 14*  ALT 21 17  ALKPHOS 104 89  BILITOT 0.3 0.5  PROT 6.9 6.5  ALBUMIN 4.0 3.7   No results for input(s): LIPASE, AMYLASE in the last 168 hours. No results for input(s): AMMONIA in the last 168 hours. CBC: Recent Labs  Lab 11/22/18 2316  WBC 11.9*  NEUTROABS 7.5  HGB 14.7  HCT 45.5  MCV 81.3  PLT 226   Cardiac Enzymes: No results for input(s): CKTOTAL, CKMB, CKMBINDEX, TROPONINI in the last 168 hours. BNP: Invalid input(s): POCBNP CBG: Recent Labs  Lab 11/23/18 1017  GLUCAP 156*   D-Dimer No results for input(s): DDIMER in the last 72 hours. Hgb A1c No results for input(s): HGBA1C in the last 72 hours. Lipid Profile Recent Labs    11/23/18 0558  CHOL 145  HDL 27*  LDLCALC 99  TRIG 93  CHOLHDL 5.4   Thyroid function studies No results for input(s): TSH, T4TOTAL, T3FREE, THYROIDAB in the last 72 hours.  Invalid input(s): FREET3 Anemia work up No results for input(s): VITAMINB12, FOLATE, FERRITIN, TIBC, IRON, RETICCTPCT in the last 72 hours. Urinalysis     Component Value Date/Time   COLORURINE YELLOW 11/23/2018 0143   APPEARANCEUR CLEAR 11/23/2018 0143   LABSPEC 1.043 (H) 11/23/2018 0143   PHURINE 5.0 11/23/2018 0143   GLUCOSEU NEGATIVE 11/23/2018 0143   HGBUR NEGATIVE 11/23/2018 0143   BILIRUBINUR NEGATIVE 11/23/2018 0143   KETONESUR NEGATIVE 11/23/2018 0143   PROTEINUR NEGATIVE 11/23/2018 0143   UROBILINOGEN 0.2 09/15/2013 1011   NITRITE NEGATIVE 11/23/2018 0143   LEUKOCYTESUR NEGATIVE 11/23/2018 0143   Sepsis Labs Invalid input(s): PROCALCITONIN,  WBC,  LACTICIDVEN Microbiology Recent Results (from the past 240 hour(s))  SARS Coronavirus 2 [facility] order, Performed in [facility] hospital lab) Nasopharyngeal Nasopharyngeal Swab     Status: None   Collection Time: 11/23/18  3:19 AM   Specimen: Nasopharyngeal Swab  Result Value Ref Range Status   SARS Coronavirus 2 NEGATIVE NEGATIVE Final    Comment: (NOTE) If result is NEGATIVE SARS-CoV-2 target nucleic acids are NOT DETECTED. The SARS-CoV-2 RNA is generally detectable in upper and lower  respiratory specimens during the acute phase of infection. The lowest  concentration of SARS-CoV-2 viral copies this assay can detect is 250  copies / mL. A negative result does not preclude SARS-CoV-2 infection  and should not be used as the sole basis for treatment or other  patient management decisions.  A negative result may occur with  improper specimen collection / handling, submission of specimen other  than nasopharyngeal swab, presence of viral mutation(s) within the  areas targeted by this assay, and inadequate number of viral copies  (<250 copies / mL). A negative result must be combined with clinical  observations, patient history, and epidemiological information. If result is POSITIVE SARS-CoV-2 target nucleic acids are DETECTED. The SARS-CoV-2 RNA is generally detectable in upper and lower  respiratory specimens dur ing the acute phase of infection.  Positive  results are  indicative of active infection with SARS-CoV-2.  Clinical  correlation with patient history and other diagnostic information is  necessary to determine patient infection status.  Positive results do  not rule out bacterial infection or co-infection with other viruses. If result is PRESUMPTIVE POSTIVE SARS-CoV-2 nucleic acids MAY BE PRESENT.   A presumptive positive result was obtained on the submitted specimen  and confirmed on repeat testing.  While 2019 novel coronavirus  (SARS-CoV-2) nucleic acids may be present in the submitted sample  additional confirmatory testing may be necessary for epidemiological  and / or clinical management purposes  to differentiate between  SARS-CoV-2 and other Sarbecovirus currently known to infect humans.  If clinically indicated additional testing with an alternate test  methodology [phone]) is advised. The SARS-CoV-2 RNA is generally  detectable in upper and lower respiratory sp ecimens  during the acute  phase of infection. The expected result is Negative. Fact Sheet for Patients:  StrictlyIdeas.no Fact Sheet for Healthcare Providers: BankingDealers.co.za This test is not yet approved or cleared by the [place] FDA and has been authorized for detection and/or diagnosis of SARS-CoV-2 by FDA under an Emergency Use Authorization (EUA).  This EUA will remain in effect (meaning this test can be used) for the duration of the COVID-19 declaration under Section 564(b)(1) of the Act, 21 U.S.C. section 360bbb-3(b)(1), unless the authorization is terminated or revoked sooner. Performed at [facility], [address], [place], [place] [address]      Time coordinating discharge: 35 minutes  SIGNED:   Rodena Goldmann, DO [facility] 11/23/2018, 12:43 PM  If 7PM-7AM, please contact night-coverage [email or web address] Password [email or web address]

## 2018-11-23 NOTE — H&P (Signed)
TRH H&P    Patient Demographics:    Craig Knight, is a 53 y.o. male  MRN: [id number]  DOB - May 05, 1965  Admit Date - 11/22/2018  Referring MD/NP/PA: Malachy Moan  Outpatient Primary MD for the patient is Rosita Fire, MD  Patient coming from:  home  Chief complaint- numbness   HPI:    Craig Knight  is a 53 y.o. male,  w hypertension, Dm2,  CKD stage3,  ???Chronic diastolic CHF (EF 123456),  Smoker, asthma, apparently presents with c/o numnbess right arm since 1pm 11/22/2018.    In ED,  T 98.4, P 75  R 17, Bp 154/97  Pox 98% on RA Wt 99.8kg  CT brain  IMPRESSION: 1. Question area of punctate hyperattenuation in a left M2 branch, which could represent a small amount of thrombus. Could be further evaluated with CT angiography. 2. 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 3. Additional remote lacunar infarct in the left corona radiata was present on the prior comparison study. 4. MRI may provide a more sensitive and specific evaluation for early changes of ischemia.  CTA Brain / Neck IMPRESSION: 1. CTP suggests a small 4 mL acute infarct core amidst the hypodensity in the anterior left MCA territory seen by plain Head CT yesterday. However, no ischemic penumbra is detected. 2. 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 - bilateral PCAs (moderate to severe). - right MCA posterior M3 (moderate). 3. However, there is minimal extracranial atherosclerosis. 4. Additionally, partially visible central pulmonary artery enlargement raises the possibility of pulmonary artery hypertension.  Pt will be admitted for  numbness right arm ? stroke   Review of systems:    In addition to the HPI above,  No Fever-chills, No Headache, No changes with Vision or hearing, No problems swallowing food or Liquids, No Chest pain, Cough or Shortness of Breath, No Abdominal pain, No Nausea or Vomiting, bowel movements are regular, No Blood in stool or Urine, No dysuria, No new skin rashes or bruises, No new joints pains-aches,  No recent weight gain or loss, No polyuria, polydypsia or polyphagia, No significant Mental Stressors.  All other systems reviewed and are negative.    Past History of the following :    Past Medical History:  Diagnosis Date   Asthma    Bronchitis    COPD (chronic obstructive pulmonary disease) ([place])    Diabetes mellitus without complication ([place])    Hypertension       Past Surgical History:  Procedure Laterality Date   COLONOSCOPY N/A 09/18/2016   Procedure: COLONOSCOPY;  Surgeon: Danie Binder, MD;  Location: [facility];  Service: Endoscopy;  Laterality: N/A;  10:30 AM   lipoma removal        Social History:      Social History   Tobacco Use  Smoking status: Current Some Day Smoker    Packs/day: 0.25    Years: 25.00    Pack years: 6.25    Types: Cigarettes   Smokeless tobacco: Former Systems developer    Quit date: 07/11/2015   Tobacco comment: 10/11/19only smokes about 1-2 cigarettes per day now  Substance Use Topics   Alcohol use: Yes    Alcohol/week: 0.0 standard drinks    Comment: occasionally       Family History :     Family History  Problem Relation Age of Onset   Stroke Mother    Heart attack Father 2       CABG   Hypertension Sister    Stroke Brother    Hypertension Brother    Stomach cancer Brother    Stroke Maternal Grandmother    Heart attack Maternal Grandfather    Heart attack Paternal Grandmother        Home Medications:   Prior to Admission medications   Medication Sig Start Date End Date Taking? Authorizing  Provider  albuterol (PROVENTIL HFA;VENTOLIN HFA) 108 (90 Base) MCG/ACT inhaler Inhale 1 puff into the lungs every 6 (six) hours as needed for wheezing or shortness of breath. 04/16/18   Carmin Muskrat, MD  amLODipine (NORVASC) 10 MG tablet Take 1 tablet (10 mg total) by mouth daily for 30 days. 04/16/18 05/16/18  Carmin Muskrat, MD  aspirin EC 81 MG tablet Take 1 tablet (81 mg total) by mouth daily. 04/16/18   Carmin Muskrat, MD  chlorthalidone (HYGROTON) 25 MG tablet Take 1 tablet (25 mg total) by mouth daily for 30 days. 04/16/18 05/16/18  Carmin Muskrat, MD  Fluticasone-Umeclidin-Vilant (TRELEGY ELLIPTA) 100-62.5-25 MCG/INH AEPB Inhale 1 puff into the lungs daily. 04/16/18   Carmin Muskrat, MD  hydrALAZINE (APRESOLINE) 25 MG tablet Take 1 tablet (25 mg total) by mouth daily for 30 days. 04/16/18 05/16/18  Carmin Muskrat, MD  ipratropium-albuterol (DUONEB) 0.5-2.5 (3) MG/3ML SOLN Take 3 mLs by nebulization every 4 (four) hours as needed. 11/20/17   Fenton Foy, NP  isosorbide mononitrate (IMDUR) 60 MG 24 hr tablet Take 1 tablet (60 mg total) by mouth daily. 04/16/18   Carmin Muskrat, MD  losartan (COZAAR) 25 MG tablet Take 1 tablet (25 mg total) by mouth daily for 30 days. 04/16/18 05/16/18  Carmin Muskrat, MD  metFORMIN (GLUCOPHAGE) 500 MG tablet Take 1 tablet (500 mg total) by mouth daily with breakfast. 04/16/18   Carmin Muskrat, MD  nitroGLYCERIN (NITROSTAT) 0.4 MG SL tablet Place 1 tablet (0.4 mg total) under the tongue every 5 (five) minutes as needed for chest pain. 04/16/18   Carmin Muskrat, MD     Allergies:     Allergies  Allergen Reactions   Dust Mite Extract    Lisinopril Cough     Physical Exam:   Vitals  Blood pressure (!) 167/98, pulse (!) 54, temperature 98.4 F (36.9 C), temperature source Oral, resp. rate 17, height 5\' 8"  (1.727 m), weight 99.8 kg, SpO2 94 %.  1.  General: Alert and oriented x3  2. Psychiatric: Euthymic  3. Neurologic: Cranial  nerves II through XII intact, reflexes, 2+ symmetric, diffuse with downgoing toes bilaterally, motor strength 5/5 in all 4 extremities  4. HEENMT:  Icteric, pupils 1.5 mm, symmetric, direct, consensual, near reflexes intact Neck: No JVD  5. Respiratory : Clear to auscultation bilateral  6. Cardiovascular : Regular rate rhythm S1-S2 no murmurs gallops or rub  7. Gastrointestinal:  Abdomen: Soft nontender nondistended positive bowel sounds  8. Skin:  Extremities no cyanosis clubbing or edema  9.Musculoskeletal:  Good range of motion    Data Review:    CBC Recent Labs  Lab 11/22/18 2316  WBC 11.9*  HGB 14.7  HCT 45.5  PLT 226  MCV 81.3  MCH 26.3  MCHC 32.3  RDW 14.3  LYMPHSABS 3.0  MONOABS 0.9  EOSABS 0.4  BASOSABS 0.1   ------------------------------------------------------------------------------------------------------------------  Results for orders placed or performed during the hospital encounter of 11/22/18 (from the past 48 hour(s))  Ethanol     Status: None   Collection Time: 11/22/18 11:16 PM  Result Value Ref Range   Alcohol, Ethyl (B) <10 <10 mg/dL    Comment: (NOTE) Lowest detectable limit for serum alcohol is 10 mg/dL. For medical purposes only. Performed at [facility], [address] [place], [place] [address]   Protime-INR     Status: None   Collection Time: 11/22/18 11:16 PM  Result Value Ref Range   Prothrombin Time 12.3 11.4 - 15.2 seconds   INR 0.9 0.8 - 1.2    Comment: (NOTE) INR goal varies based on device and disease states. Performed at [facility], [address] [place], [place] [address]   APTT     Status: None   Collection Time: 11/22/18 11:16 PM  Result Value Ref Range   aPTT 27 24 - 36 seconds    Comment: Performed at [facility], [address], [place], [place] [address]  CBC     Status: Abnormal   Collection Time: 11/22/18 11:16 PM  Result Value Ref Range   WBC 11.9 (H) 4.0 - 10.5 K/uL   RBC 5.60 4.22 - 5.81  MIL/uL   Hemoglobin 14.7 13.0 - 17.0 g/dL   HCT 45.5 39.0 - 52.0 %   MCV 81.3 80.0 - 100.0 fL   MCH 26.3 26.0 - 34.0 pg   MCHC 32.3 30.0 - 36.0 g/dL   RDW 14.3 11.5 - 15.5 %   Platelets 226 150 - 400 K/uL   nRBC 0.0 0.0 - 0.2 %    Comment: Performed at [facility], [address] [place], [place] [address]  Differential     Status: None   Collection Time: 11/22/18 11:16 PM  Result Value Ref Range   Neutrophils Relative % 63 %   Neutro Abs 7.5 1.7 - 7.7 K/uL   Lymphocytes Relative 25 %   Lymphs Abs 3.0 0.7 - 4.0 K/uL   Monocytes Relative 8 %   Monocytes Absolute 0.9 0.1 - 1.0 K/uL   Eosinophils Relative 3 %   Eosinophils Absolute 0.4 0.0 - 0.5 K/uL   Basophils Relative 1 %   Basophils Absolute 0.1 0.0 - 0.1 K/uL   Immature Granulocytes 0 %   Abs Immature Granulocytes 0.02 0.00 - 0.07 K/uL    Comment: Performed at [facility], [address], [place], [place] [address]  Comprehensive metabolic panel     Status: Abnormal   Collection Time: 11/22/18 11:16 PM  Result Value Ref Range   Sodium 140 135 - 145 mmol/L   Potassium 3.5 3.5 - 5.1 mmol/L   Chloride 105 98 - 111 mmol/L   CO2 24 22 - 32 mmol/L   Glucose, Bld 120 (H) 70 - 99 mg/dL   BUN 30 (H) 6 - 20 mg/dL   Creatinine, Ser 2.05 (H) 0.61 - 1.24 mg/dL   Calcium 9.1 8.9 - 10.3 mg/dL   Total Protein 6.9 6.5 - 8.1 g/dL   Albumin 4.0 3.5 -  5.0 g/dL   AST 16 15 - 41 U/L   ALT 21 0 - 44 U/L   Alkaline Phosphatase 104 38 - 126 U/L   Total Bilirubin 0.3 0.3 - 1.2 mg/dL   GFR calc non Af Amer 36 (L) >60 mL/min   GFR calc Af Amer 41 (L) >60 mL/min   Anion gap 11 5 - 15    Comment: Performed at [facility], [address] [place], [place] [address]  Urine rapid drug screen (hosp performed)     Status: Abnormal   Collection Time: 11/23/18  1:43 AM  Result Value Ref Range   Opiates NONE DETECTED NONE DETECTED   Cocaine POSITIVE (A) NONE DETECTED   Benzodiazepines NONE DETECTED NONE DETECTED   Amphetamines NONE DETECTED NONE  DETECTED   Tetrahydrocannabinol NONE DETECTED NONE DETECTED   Barbiturates NONE DETECTED NONE DETECTED    Comment: (NOTE) DRUG SCREEN FOR MEDICAL PURPOSES ONLY.  IF CONFIRMATION IS NEEDED FOR ANY PURPOSE, NOTIFY LAB WITHIN 5 DAYS. LOWEST DETECTABLE LIMITS FOR URINE DRUG SCREEN Drug Class                     Cutoff (ng/mL) Amphetamine and metabolites    1000 Barbiturate and metabolites    200 Benzodiazepine                 [id number] Tricyclics and metabolites     300 Opiates and metabolites        300 Cocaine and metabolites        300 THC                            50 Performed at [facility] [place], [place] [address]   Urinalysis, Routine w reflex microscopic     Status: Abnormal   Collection Time: 11/23/18  1:43 AM  Result Value Ref Range   Color, Urine YELLOW YELLOW   APPearance CLEAR CLEAR   Specific Gravity, Urine 1.043 (H) 1.005 - 1.030   pH 5.0 5.0 - 8.0   Glucose, UA NEGATIVE NEGATIVE mg/dL   Hgb urine dipstick NEGATIVE NEGATIVE   Bilirubin Urine NEGATIVE NEGATIVE   Ketones, ur NEGATIVE NEGATIVE mg/dL   Protein, ur NEGATIVE NEGATIVE mg/dL   Nitrite NEGATIVE NEGATIVE   Leukocytes,Ua NEGATIVE NEGATIVE    Comment: Performed at [facility], [address], [place], [place] [address]    Chemistries  Recent Labs  Lab 11/22/18 2316  NA 140  K 3.5  CL 105  CO2 24  GLUCOSE 120*  BUN 30*  CREATININE 2.05*  CALCIUM 9.1  AST 16  ALT 21  ALKPHOS 104  BILITOT 0.3   ------------------------------------------------------------------------------------------------------------------  ------------------------------------------------------------------------------------------------------------------ GFR: Estimated Creatinine Clearance: 47.2 mL/min (A) (by C-G formula based on SCr of 2.05 mg/dL (H)). Liver Function Tests: Recent Labs  Lab 11/22/18 2316  AST 16  ALT 21  ALKPHOS 104  BILITOT 0.3  PROT 6.9  ALBUMIN 4.0   No results for input(s): LIPASE,  AMYLASE in the last 168 hours. No results for input(s): AMMONIA in the last 168 hours. Coagulation Profile: Recent Labs  Lab 11/22/18 2316  INR 0.9   Cardiac Enzymes: No results for input(s): CKTOTAL, CKMB, CKMBINDEX, TROPONINI in the last 168 hours. BNP (last 3 results) No results for input(s): PROBNP in the last 8760 hours. HbA1C: No results for input(s): HGBA1C in the last 72 hours. CBG: No results for input(s): GLUCAP in the  last 168 hours. Lipid Profile: No results for input(s): CHOL, HDL, LDLCALC, TRIG, CHOLHDL, LDLDIRECT in the last 72 hours. Thyroid Function Tests: No results for input(s): TSH, T4TOTAL, FREET4, T3FREE, THYROIDAB in the last 72 hours. Anemia Panel: No results for input(s): VITAMINB12, FOLATE, FERRITIN, TIBC, IRON, RETICCTPCT in the last 72 hours.  --------------------------------------------------------------------------------------------------------------- Urine analysis:    Component Value Date/Time   COLORURINE YELLOW 11/23/2018 0143   APPEARANCEUR CLEAR 11/23/2018 0143   LABSPEC 1.043 (H) 11/23/2018 0143   PHURINE 5.0 11/23/2018 0143   GLUCOSEU NEGATIVE 11/23/2018 0143   HGBUR NEGATIVE 11/23/2018 0143   BILIRUBINUR NEGATIVE 11/23/2018 0143   KETONESUR NEGATIVE 11/23/2018 0143   PROTEINUR NEGATIVE 11/23/2018 0143   UROBILINOGEN 0.2 09/15/2013 1011   NITRITE NEGATIVE 11/23/2018 0143   LEUKOCYTESUR NEGATIVE 11/23/2018 0143      Imaging Results:    Ct Angio Head W Or Wo Contrast  Result Date: 11/23/2018 CLINICAL DATA:  53 year old male right side weakness and tingling since 1330 hours yesterday. EXAM: CT ANGIOGRAPHY HEAD AND NECK CT PERFUSION BRAIN TECHNIQUE: Multidetector CT imaging of the head and neck was performed using the standard protocol during bolus administration of intravenous contrast. Multiplanar CT image reconstructions and MIPs were obtained to evaluate the vascular anatomy. Carotid stenosis measurements (when applicable) are  obtained utilizing NASCET criteria, using the distal internal carotid diameter as the denominator. Multiphase CT imaging of the brain was performed following IV bolus contrast injection. Subsequent parametric perfusion maps were calculated using RAPID software. CONTRAST:  128mL OMNIPAQUE IOHEXOL 350 MG/ML SOLN COMPARISON:  Head CT 11/22/2018. FINDINGS: CT Brain Perfusion Findings: ASPECTS: Estimated at 9; subacute to chronic appearing left MCA anterior division territory infarct with additional age indeterminate hypodensity in the bilateral white matter and basal ganglia. CBF (<30%) Volume: 37mL, corresponding to a portion of the hypodense area at the inferior left frontal gyrus. Perfusion (Tmax>6.0s) volume: 82mL Mismatch Volume: Not applicable Infarction Location:Anterior left MCA as above. CTA NECK Skeleton: No acute osseous abnormality identified. Right side maxillary molar periapical lucency. Upper chest: Enlarged central pulmonary artery on series 6, image 9. Otherwise negative visible upper chest. Other neck: Small subcutaneous fluid collection along the posterior base of the neck on the right series 6, image 73, probably a sebaceous cyst or similar. No other acute findings in the neck. Aortic arch: 3 vessel arch configuration with no arch atherosclerosis. Right carotid system: Mildly tortuous brachiocephalic artery. Capacious right CCA without stenosis. Capacious right carotid bifurcation with mild right ICA tortuosity. No plaque or stenosis. Left carotid system: Negative left CCA. Mild calcified plaque at the left ECA origin. Mildly tortuous left ICA with no plaque or stenosis to the skull base. Vertebral arteries: Negative proximal right subclavian artery and right vertebral artery origin. Patent and negative right vertebral artery to the skull base. Negative proximal left subclavian artery and left vertebral artery origin. Patent and negative left vertebral artery to the skull base. CTA HEAD Posterior  circulation: Proximal origin of the left PICA which is normal. Bulky soft and calcified plaque of the downstream left V4 segment with moderate stenosis (series 8, image 147). Patent left vertebrobasilar junction. Comparatively mild calcified plaque of the right V4 segment with only mild stenosis. Patent right PICA origin. Fenestrated vertebrobasilar junction (normal variant). Patent basilar artery without stenosis. Normal SCA and PCA origins. Posterior communicating arteries are diminutive or absent. Moderate to severe bilateral PCA P2 segment (P1/P2 junction on the left side) irregularity and stenosis best seen on series 11, image 21. Anterior  circulation: Both ICA siphons are patent. On the left there is mild calcified plaque with no stenosis. On the right there is no siphon plaque or stenosis. Normal ophthalmic artery origins. Patent carotid termini. Normal MCA and ACA origins. Partially fenestrated left ACA A1 segment, normal variant. Diminutive anterior communicating artery. Bilateral ACA branches are patent with mild irregularity in the distal right A2 segment on series 13, image 25. Right MCA M1 segment and trifurcation are patent without stenosis. Right MCA branches are patent with mild to moderate irregularity and stenosis in the posterior M3 segments on series 13, image 18. Left MCA M1 and bifurcation are patent without stenosis. Generalized mild irregularity of left MCA branches including the anterior left M2 on series 13, image 33, but no left MCA branch occlusion identified. Venous sinuses: Patent. Dominant appearing left transverse and sigmoid sinuses. Anatomic variants: Fenestrated vertebrobasilar junction. Review of the MIP images confirms the above findings IMPRESSION: 1. CTP suggests a small 4 mL acute infarct core amidst the hypodensity in the anterior left MCA territory seen by plain Head CT yesterday. However, no ischemic penumbra is detected. 2. CTA is Negative for large vessel occlusion, but  is positive for age advanced intracranial atherosclerosis, including mild irregularity of left MCA branches, as well as more pronounced stenoses: - left Vertebral artery V4 (moderate). - bilateral PCAs (moderate to severe). - right MCA posterior M3 (moderate). 3. However, there is minimal extracranial atherosclerosis. 4. Additionally, partially visible central pulmonary artery enlargement raises the possibility of pulmonary artery hypertension. Salient findings discussed by telephone with Dr. Joseph Berkshire on 11/23/2018 at 02:11 . Electronically Signed   By: Genevie Ann M.D.   On: 11/23/2018 02:14   Ct Head Wo Contrast  Result Date: 11/23/2018 CLINICAL DATA:  Right-sided weakness and tingling since 1330 hours EXAM: CT HEAD WITHOUT CONTRAST TECHNIQUE: Contiguous axial images were obtained from the base of the skull through the vertex without intravenous contrast. COMPARISON:  CT head 09/15/2013 FINDINGS: Brain: There is a region of gliosis in the left middle frontal gyrus not present on comparison CT from July 2015. Extensive hypoattenuation of the cortex favor subacute to chronic infarct. An additional remote lacunar infarct was present on the prior comparison study. No evidence of acute hemorrhage, hydrocephalus, or extra-axial collection. Patchy areas of white matter hypoattenuation are most compatible with chronic microvascular angiopathy. Vascular: Question area of punctate hyperattenuation in a left M2 branch (2/12). Calcifications are present in the carotid siphons and vertebral arteries. Skull: No calvarial fracture or suspicious osseous lesion. No scalp swelling or hematoma. Sinuses/Orbits: Paranasal sinuses and mastoid air cells are predominantly clear. Included orbital structures are unremarkable. Other: None IMPRESSION: 1. Question area of punctate hyperattenuation in a left M2 branch, which could represent a small amount of thrombus. Could be further evaluated with CT angiography. 2. Extensive  hypoattenuation of the cortex of the left middle frontal gyrus, though appearance most compatible with subacute to chronic infarct. 3. Additional remote lacunar infarct in the left corona radiata was present on the prior comparison study. 4. MRI may provide a more sensitive and specific evaluation for early changes of ischemia. Critical Value/emergent results were called by telephone at the time of interpretation on 11/23/2018 at 12:15 am to providerCHRISTOPHER POLLINA , who verbally acknowledged these results. Electronically Signed   By: Lovena Le M.D.   On: 11/23/2018 00:18   Ct Angio Neck W And/or Wo Contrast  Result Date: 11/23/2018 CLINICAL DATA:  53 year old male right side weakness and tingling since  1330 hours yesterday. EXAM: CT ANGIOGRAPHY HEAD AND NECK CT PERFUSION BRAIN TECHNIQUE: Multidetector CT imaging of the head and neck was performed using the standard protocol during bolus administration of intravenous contrast. Multiplanar CT image reconstructions and MIPs were obtained to evaluate the vascular anatomy. Carotid stenosis measurements (when applicable) are obtained utilizing NASCET criteria, using the distal internal carotid diameter as the denominator. Multiphase CT imaging of the brain was performed following IV bolus contrast injection. Subsequent parametric perfusion maps were calculated using RAPID software. CONTRAST:  154mL OMNIPAQUE IOHEXOL 350 MG/ML SOLN COMPARISON:  Head CT 11/22/2018. FINDINGS: CT Brain Perfusion Findings: ASPECTS: Estimated at 9; subacute to chronic appearing left MCA anterior division territory infarct with additional age indeterminate hypodensity in the bilateral white matter and basal ganglia. CBF (<30%) Volume: 36mL, corresponding to a portion of the hypodense area at the inferior left frontal gyrus. Perfusion (Tmax>6.0s) volume: 58mL Mismatch Volume: Not applicable Infarction Location:Anterior left MCA as above. CTA NECK Skeleton: No acute osseous abnormality  identified. Right side maxillary molar periapical lucency. Upper chest: Enlarged central pulmonary artery on series 6, image 9. Otherwise negative visible upper chest. Other neck: Small subcutaneous fluid collection along the posterior base of the neck on the right series 6, image 73, probably a sebaceous cyst or similar. No other acute findings in the neck. Aortic arch: 3 vessel arch configuration with no arch atherosclerosis. Right carotid system: Mildly tortuous brachiocephalic artery. Capacious right CCA without stenosis. Capacious right carotid bifurcation with mild right ICA tortuosity. No plaque or stenosis. Left carotid system: Negative left CCA. Mild calcified plaque at the left ECA origin. Mildly tortuous left ICA with no plaque or stenosis to the skull base. Vertebral arteries: Negative proximal right subclavian artery and right vertebral artery origin. Patent and negative right vertebral artery to the skull base. Negative proximal left subclavian artery and left vertebral artery origin. Patent and negative left vertebral artery to the skull base. CTA HEAD Posterior circulation: Proximal origin of the left PICA which is normal. Bulky soft and calcified plaque of the downstream left V4 segment with moderate stenosis (series 8, image 147). Patent left vertebrobasilar junction. Comparatively mild calcified plaque of the right V4 segment with only mild stenosis. Patent right PICA origin. Fenestrated vertebrobasilar junction (normal variant). Patent basilar artery without stenosis. Normal SCA and PCA origins. Posterior communicating arteries are diminutive or absent. Moderate to severe bilateral PCA P2 segment (P1/P2 junction on the left side) irregularity and stenosis best seen on series 11, image 21. Anterior circulation: Both ICA siphons are patent. On the left there is mild calcified plaque with no stenosis. On the right there is no siphon plaque or stenosis. Normal ophthalmic artery origins. Patent  carotid termini. Normal MCA and ACA origins. Partially fenestrated left ACA A1 segment, normal variant. Diminutive anterior communicating artery. Bilateral ACA branches are patent with mild irregularity in the distal right A2 segment on series 13, image 25. Right MCA M1 segment and trifurcation are patent without stenosis. Right MCA branches are patent with mild to moderate irregularity and stenosis in the posterior M3 segments on series 13, image 18. Left MCA M1 and bifurcation are patent without stenosis. Generalized mild irregularity of left MCA branches including the anterior left M2 on series 13, image 33, but no left MCA branch occlusion identified. Venous sinuses: Patent. Dominant appearing left transverse and sigmoid sinuses. Anatomic variants: Fenestrated vertebrobasilar junction. Review of the MIP images confirms the above findings IMPRESSION: 1. CTP suggests a small 4 mL acute infarct  core amidst the hypodensity in the anterior left MCA territory seen by plain Head CT yesterday. However, no ischemic penumbra is detected. 2. 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 - bilateral PCAs (moderate to severe). - right MCA posterior M3 (moderate). 3. However, there is minimal extracranial atherosclerosis. 4. Additionally, partially visible central pulmonary artery enlargement raises the possibility of pulmonary artery hypertension. Salient findings discussed by telephone with Dr. Joseph Berkshire on 11/23/2018 at 02:11 . Electronically Signed   By: Genevie Ann M.D.   On: 11/23/2018 02:14   Ct Cerebral Perfusion W Contrast  Result Date: 11/23/2018 CLINICAL DATA:  53 year old male right side weakness and tingling since 1330 hours yesterday. EXAM: CT ANGIOGRAPHY HEAD AND NECK CT PERFUSION BRAIN TECHNIQUE: Multidetector CT  imaging of the head and neck was performed using the standard protocol during bolus administration of intravenous contrast. Multiplanar CT image reconstructions and MIPs were obtained to evaluate the vascular anatomy. Carotid stenosis measurements (when applicable) are obtained utilizing NASCET criteria, using the distal internal carotid diameter as the denominator. Multiphase CT imaging of the brain was performed following IV bolus contrast injection. Subsequent parametric perfusion maps were calculated using RAPID software. CONTRAST:  166mL OMNIPAQUE IOHEXOL 350 MG/ML SOLN COMPARISON:  Head CT 11/22/2018. FINDINGS: CT Brain Perfusion Findings: ASPECTS: Estimated at 9; subacute to chronic appearing left MCA anterior division territory infarct with additional age indeterminate hypodensity in the bilateral white matter and basal ganglia. CBF (<30%) Volume: 82mL, corresponding to a portion of the hypodense area at the inferior left frontal gyrus. Perfusion (Tmax>6.0s) volume: 78mL Mismatch Volume: Not applicable Infarction Location:Anterior left MCA as above. CTA NECK Skeleton: No acute osseous abnormality identified. Right side maxillary molar periapical lucency. Upper chest: Enlarged central pulmonary artery on series 6, image 9. Otherwise negative visible upper chest. Other neck: Small subcutaneous fluid collection along the posterior base of the neck on the right series 6, image 73, probably a sebaceous cyst or similar. No other acute findings in the neck. Aortic arch: 3 vessel arch configuration with no arch atherosclerosis. Right carotid system: Mildly tortuous brachiocephalic artery. Capacious right CCA without stenosis. Capacious right carotid bifurcation with mild right ICA tortuosity. No plaque or stenosis. Left carotid system: Negative left CCA. Mild calcified plaque at the left ECA origin. Mildly tortuous left ICA with no plaque or stenosis to the skull base. Vertebral arteries: Negative proximal right  subclavian artery and right vertebral artery origin. Patent and negative right vertebral artery to the skull base. Negative proximal left subclavian artery and left vertebral artery origin. Patent and negative left vertebral artery to the skull base. CTA HEAD Posterior circulation: Proximal origin of the left PICA which is normal. Bulky soft and calcified plaque of the downstream left V4 segment with moderate stenosis (series 8, image 147). Patent left vertebrobasilar junction. Comparatively mild calcified plaque of the right V4 segment with only mild stenosis. Patent right PICA origin. Fenestrated vertebrobasilar junction (normal variant). Patent basilar artery without stenosis. Normal SCA and PCA origins. Posterior communicating arteries are diminutive or absent. Moderate to severe bilateral PCA P2 segment (P1/P2 junction on the left side) irregularity and stenosis best seen on series 11, image 21. Anterior circulation: Both ICA siphons are patent. On the left there is mild calcified plaque with no stenosis. On the right there is no siphon plaque or stenosis. Normal  ophthalmic artery origins. Patent carotid termini. Normal MCA and ACA origins. Partially fenestrated left ACA A1 segment, normal variant. Diminutive anterior communicating artery. Bilateral ACA branches are patent with mild irregularity in the distal right A2 segment on series 13, image 25. Right MCA M1 segment and trifurcation are patent without stenosis. Right MCA branches are patent with mild to moderate irregularity and stenosis in the posterior M3 segments on series 13, image 18. Left MCA M1 and bifurcation are patent without stenosis. Generalized mild irregularity of left MCA branches including the anterior left M2 on series 13, image 33, but no left MCA branch occlusion identified. Venous sinuses: Patent. Dominant appearing left transverse and sigmoid sinuses. Anatomic variants: Fenestrated vertebrobasilar junction. Review of the MIP images  confirms the above findings IMPRESSION: 1. CTP suggests a small 4 mL acute infarct core amidst the hypodensity in the anterior left MCA territory seen by plain Head CT yesterday. However, no ischemic penumbra is detected. 2. 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 - bilateral PCAs (moderate to severe). - right MCA posterior M3 (moderate). 3. However, there is minimal extracranial atherosclerosis. 4. Additionally, partially visible central pulmonary artery enlargement raises the possibility of pulmonary artery hypertension. Salient findings discussed by telephone with Dr. Joseph Berkshire on 11/23/2018 at 02:11 . Electronically Signed   By: Genevie Ann M.D.   On: 11/23/2018 02:14   ekg normal sinus rhythm at 70, normal axis, Q waves in V1 through 3, ST elevation in V1 through 3, old, T wave inversion in 1, aVL, V5, V6   Assessment & Plan:    Principal Problem:   Numbness Active Problems:   HTN (hypertension)   DM type 2 (diabetes mellitus, type 2) (HCC)   CKD (chronic kidney disease), stage III (HCC)   Stroke (HCC)  Numbness Check MRI brain without contrast Check cardiac echo Check hemoglobin A1c, lipid Continue aspirin Lipitor 80 mg p.o. nightly Permissive hypertension Patient admission order for [facility], if neurology is available at [facility] please change to [facility] Please consult neurology this a.m.  Acute renal failure on CKD stage III Hold losartan Hydrate gently with ns iv Check cmp in am  Hypertension Increase Hydralazine to 25mg  po tid Cont Chlorthalidone 25mg  po qday  Dm2 STOP Metformin due to CKD stage3 fsbs ac and qhs, ISS  CAD Cont aspirin Cont Imdur Lipitor as above  Copd Cont Trelegy 1puff qday Hold Duoneb for now Albuterol HFA 1puff q6h prn     DVT Prophylaxis-   Lovenox - SCDs   AM Labs Ordered, also please review Full Orders  Family Communication: Admission, patients condition and plan of care including tests being ordered have been discussed with the patient  who indicate understanding and agree with the plan and Code Status.  Code Status:  FULL CODE per patient , notified wife that pt admitted for possible CVA  Admission status: Observation: Based on patients clinical presentation and evaluation of above clinical data, I have made determination that patient meets observation criteria at this time.   Time spent in minutes : 70   Jani Gravel M.D on 11/23/2018 at 4:27 AM

## 2018-11-23 NOTE — Evaluation (Signed)
Physical Therapy Evaluation Patient Details Name: Craig Knight MRN: [id number] DOB: December 21, 1965 Today's Date: 11/23/2018   History of Present Illness   Craig Knight  is a 53 y.o. male,  w hypertension, Dm2,  CKD stage3,  ???Chronic diastolic CHF (EF 123456),  Smoker, asthma, apparently presents with c/o numnbess right arm since 1pm 11/22/2018.      Clinical Impression  Patient functioning near baseline for functional mobility and gait, other than c/o right sided numbness and slightly decreased speed of cadence, no loss of balance and demonstrates good return for completing functional tasks.  Plan:  Patient discharged from physical therapy to care of nursing for ambulation daily as tolerated for length of stay.     Follow Up Recommendations No PT follow up    Equipment Recommendations  None recommended by PT    Recommendations for Other Services       Precautions / Restrictions Precautions Precautions: None Restrictions Weight Bearing Restrictions: No      Mobility  Bed Mobility Overal bed mobility: Independent                Transfers Overall transfer level: Independent                  Ambulation/Gait Ambulation/Gait assistance: Modified independent (Device/Increase time) Gait Distance (Feet): 200 Feet Assistive device: None Gait Pattern/deviations: WFL(Within Functional Limits) Gait velocity: decreased   General Gait Details: no loss of balance, slightly labored  Stairs            Wheelchair Mobility    Modified Rankin (Stroke Patients Only)       Balance Overall balance assessment: No apparent balance deficits (not formally assessed)                                           Pertinent Vitals/Pain Pain Assessment: 0-10 Pain Score: 4  Pain Location: right arm, leg Pain Descriptors / Indicators: Numbness Pain Intervention(s): Limited activity within patient's tolerance;Monitored during session    Home Living  Family/patient expects to be discharged to:: Private residence Living Arrangements: Parent Available Help at Discharge: Family;Available PRN/intermittently Type of Home: House         Home Equipment: None      Prior Function Level of Independence: Independent               Hand Dominance   Dominant Hand: Right    Extremity/Trunk Assessment   Upper Extremity Assessment Upper Extremity Assessment: Overall WFL for tasks assessed    Lower Extremity Assessment Lower Extremity Assessment: Overall WFL for tasks assessed    Cervical / Trunk Assessment Cervical / Trunk Assessment: Normal  Communication   Communication: No difficulties  Cognition Arousal/Alertness: Awake/alert Behavior During Therapy: WFL for tasks assessed/performed Overall Cognitive Status: Within Functional Limits for tasks assessed                                        General Comments      Exercises     Assessment/Plan    PT Assessment Patent does not need any further PT services  PT Problem List         PT Treatment Interventions      PT Goals (Current goals can be found in the Care Plan section)  Acute Rehab  PT Goals Patient Stated Goal: (return home) PT Goal Formulation: With patient Time For Goal Achievement: 11/23/18 Potential to Achieve Goals: Good    Frequency     Barriers to discharge        Co-evaluation               AM-PAC PT "6 Clicks" Mobility  Outcome Measure Help needed turning from your back to your side while in a flat bed without using bedrails?: None Help needed moving from lying on your back to sitting on the side of a flat bed without using bedrails?: None Help needed moving to and from a bed to a chair (including a wheelchair)?: None Help needed standing up from a chair using your arms (e.g., wheelchair or bedside chair)?: None Help needed to walk in hospital room?: None Help needed climbing 3-5 steps with a railing? : None 6  Click Score: 24    End of Session   Activity Tolerance: Patient tolerated treatment well;Patient limited by fatigue Patient left: in bed(seated at bedside) Nurse Communication: Mobility status PT Visit Diagnosis: Unsteadiness on feet (R26.81);Other abnormalities of gait and mobility (R26.89);Muscle weakness (generalized) (M62.81)    Time: [id number] PT Time Calculation (min) (ACUTE ONLY): 21 min   Charges:   PT Evaluation $PT Eval Moderate Complexity: 1 Mod PT Treatments $Therapeutic Activity: 8-22 mins        1:53 PM, 11/23/18 Lonell Grandchild, MPT Physical Therapist with [facility] [phone] office [phone] mobile phone

## 2018-11-23 NOTE — ED Notes (Signed)
Pt refused MRI.

## 2018-11-24 LAB — HIV ANTIBODY (ROUTINE TESTING W REFLEX): HIV Screen 4th Generation wRfx: NONREACTIVE

## 2018-11-27 DIAGNOSIS — E119 Type 2 diabetes mellitus without complications: Secondary | ICD-10-CM | POA: Diagnosis not present

## 2018-11-27 DIAGNOSIS — R202 Paresthesia of skin: Secondary | ICD-10-CM | POA: Diagnosis not present

## 2018-11-27 DIAGNOSIS — I10 Essential (primary) hypertension: Secondary | ICD-10-CM | POA: Diagnosis not present

## 2018-11-27 DIAGNOSIS — I2510 Atherosclerotic heart disease of native coronary artery without angina pectoris: Secondary | ICD-10-CM | POA: Diagnosis not present

## 2018-12-14 ENCOUNTER — Other Ambulatory Visit: Payer: Self-pay | Admitting: Pulmonary Disease

## 2018-12-14 ENCOUNTER — Other Ambulatory Visit: Payer: Self-pay | Admitting: Nurse Practitioner

## 2018-12-14 DIAGNOSIS — I110 Hypertensive heart disease with heart failure: Secondary | ICD-10-CM | POA: Diagnosis not present

## 2018-12-14 DIAGNOSIS — J449 Chronic obstructive pulmonary disease, unspecified: Secondary | ICD-10-CM | POA: Diagnosis not present

## 2018-12-14 DIAGNOSIS — I6930 Unspecified sequelae of cerebral infarction: Secondary | ICD-10-CM | POA: Diagnosis not present

## 2018-12-14 DIAGNOSIS — G4733 Obstructive sleep apnea (adult) (pediatric): Secondary | ICD-10-CM | POA: Diagnosis not present

## 2018-12-17 ENCOUNTER — Other Ambulatory Visit: Payer: Self-pay | Admitting: Neurology

## 2018-12-17 DIAGNOSIS — I639 Cerebral infarction, unspecified: Secondary | ICD-10-CM

## 2019-01-01 ENCOUNTER — Ambulatory Visit
Admission: RE | Admit: 2019-01-01 | Discharge: 2019-01-01 | Disposition: A | Discharge status: Home / Self Care | Payer: Medicare HMO | Source: Ambulatory Visit | Attending: Neurology | Admitting: Neurology

## 2019-01-01 ENCOUNTER — Other Ambulatory Visit: Payer: Self-pay

## 2019-01-01 DIAGNOSIS — I639 Cerebral infarction, unspecified: Secondary | ICD-10-CM

## 2019-01-11 ENCOUNTER — Encounter: Payer: Self-pay | Admitting: Pulmonary Disease

## 2019-01-11 ENCOUNTER — Other Ambulatory Visit: Payer: Self-pay

## 2019-01-11 ENCOUNTER — Ambulatory Visit (INDEPENDENT_AMBULATORY_CARE_PROVIDER_SITE_OTHER): Payer: Medicare HMO | Admitting: Pulmonary Disease

## 2019-01-11 DIAGNOSIS — R918 Other nonspecific abnormal finding of lung field: Secondary | ICD-10-CM

## 2019-01-11 MED ORDER — TRELEGY ELLIPTA 100-62.5-25 MCG/INH IN AEPB: 1.0000 | INHALATION_SPRAY | Freq: Every day | RESPIRATORY_TRACT | 3 refills | Status: DC

## 2019-01-11 MED ORDER — ALBUTEROL SULFATE HFA 108 (90 BASE) MCG/ACT IN AERS: 2.0000 | INHALATION_SPRAY | Freq: Four times a day (QID) | RESPIRATORY_TRACT | 11 refills | Status: DC | PRN

## 2019-01-11 NOTE — Progress Notes (Signed)
Craig Knight    [id number]    [birth year]  Primary Care Physician:Knight, Craig Melnick, MD  Referring Physician: Rosita Fire, MD [address] [place],  [place] [address]  Chief complaint:   Follow up for  COPD GOLD B (mmRC 3, 0 exacerbations) Asthma Non compliance  HPI: Craig Knight is 53 year old with past medical history of COPD, asthma, hypertension, DM, diastolic active smoker. He was diagnosed with asthma as a child which got better in adulthood. 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 He reports mainly dyspnea on exertion and does not have dyspnea on rest, wheezing. He has daily symptoms of cough with white sputum production.nHe does not have seasonal allergies, sensitivities to cats, dogs, strong perfumes, no heartburn symptoms. Evaluated by cardiology and had a CT coronary done which showed intermediate risk for ischemia.  Lung images shows a 4 mm nodule with no other acute lung abnormality.  He has over 38-pack-year smoking history.  Continues to smoke a few cigarettes every day Currently disabled due to dyspnea.  Interim history: Continues on Trelegy inhaler.  Not using his BiPAP. States that he quit smoking a month ago.  Dyspnea on exertion is stable.  No cough, sputum production, fevers, chills.  Hospitalized in September for small strokes.  He is followed up with neurology Dr. Merlene Knight.  MRI attempted as an outpatient but could not complete due to anxiety.  Outpatient Encounter Medications as of 01/11/2019  Medication Sig  . albuterol (VENTOLIN HFA) 108 (90 Base) MCG/ACT inhaler Inhale 2 puffs into the lungs every 6 (six) hours as needed for wheezing or shortness of breath.  Marland Kitchen amLODipine (NORVASC) 10 MG tablet Take 1 tablet (10 mg total) by mouth daily for 30 days.  Marland Kitchen aspirin EC 81 MG tablet Take 1 tablet (81 mg total) by mouth daily.  Marland Kitchen atorvastatin (LIPITOR) 40 MG tablet Take  1 tablet (40 mg total) by mouth daily.  . chlorthalidone (HYGROTON) 25 MG tablet Take 1 tablet (25 mg total) by mouth daily for 30 days.  . clopidogrel (PLAVIX) 75 MG tablet Take 1 tablet (75 mg total) by mouth daily.  . DULoxetine (CYMBALTA) 30 MG capsule TAKE 1 CAPSULE BY MOUTH DAILY FOR 1 WEEK THEN 2 CAPSULES DAILY  . Fluticasone-Umeclidin-Vilant (TRELEGY ELLIPTA) 100-62.5-25 MCG/INH AEPB Inhale 1 puff into the lungs daily.  Marland Kitchen gabapentin (NEURONTIN) 300 MG capsule Take 300 mg by mouth 2 (two) times daily.  . hydrALAZINE (APRESOLINE) 50 MG tablet Take 50 mg by mouth 2 (two) times daily.  Marland Kitchen ipratropium-albuterol (DUONEB) 0.5-2.5 (3) MG/3ML SOLN Take 3 mLs by nebulization every 4 (four) hours as needed.  . isosorbide mononitrate (IMDUR) 60 MG 24 hr tablet Take 1 tablet (60 mg total) by mouth daily.  Marland Kitchen losartan (COZAAR) 100 MG tablet Take 100 mg by mouth daily.  . metFORMIN (GLUCOPHAGE) 500 MG tablet Take 1 tablet (500 mg total) by mouth daily with breakfast.  . nitroGLYCERIN (NITROSTAT) 0.4 MG SL tablet Place 1 tablet (0.4 mg total) under the tongue every 5 (five) minutes as needed for chest pain.  Marland Kitchen omeprazole (PRILOSEC) 40 MG capsule Take 40 mg by mouth daily.  . [DISCONTINUED] albuterol (PROVENTIL HFA;VENTOLIN HFA) 108 (90 Base) MCG/ACT inhaler Inhale 1 puff into the lungs every 6 (six) hours as needed for wheezing or shortness of breath.  . [DISCONTINUED] Fluticasone-Umeclidin-Vilant (TRELEGY ELLIPTA) 100-62.5-25 MCG/INH AEPB Inhale  1 puff into the lungs daily.  . [DISCONTINUED] hydrALAZINE (APRESOLINE) 25 MG tablet Take 1 tablet (25 mg total) by mouth daily for 30 days. (Patient not taking: Reported on 11/23/2018)  . [DISCONTINUED] losartan (COZAAR) 25 MG tablet Take 1 tablet (25 mg total) by mouth daily for 30 days. (Patient not taking: Reported on 11/23/2018)  . [DISCONTINUED] losartan (COZAAR) 50 MG tablet Take 50 mg by mouth daily.   No facility-administered encounter medications on file as  of 01/11/2019.    Physical Exam: Blood pressure 132/80, pulse 70, temperature (!) 97 F (36.1 C), temperature source Temporal, height 5\' 7"  (1.702 m), weight 217 lb 6.4 oz (98.6 kg), SpO2 99 %. Gen:      No acute distress HEENT:  EOMI, sclera anicteric Neck:     No masses; no thyromegaly Lungs:    Clear to auscultation bilaterally; normal respiratory effort CV:         Regular rate and rhythm; no murmurs Abd:      + bowel sounds; soft, non-tender; no palpable masses, no distension Ext:    No edema; adequate peripheral perfusion Skin:      Warm and dry; no rash Neuro: alert and oriented x 3 Psych: normal mood and affect  Data Reviewed: Imaging CT abdomen 06/04/16- mild left base atelectasis with scarring. No acute infiltrate or fibrosis Coronary 01/08/17- Lung images show 4 mm right middle lobe lung nodule with no acute lung infiltrate or consolidation. CT angiogram 03/10/17-no pulmonary embolism, prominence of main pulmonary outflow tract.  Subcentimeter pulmonary nodule.  The largest measuring 4 mm. I reviewed all images personally.  PFTs PFTs 04/24/16 FVC 2.01 (62%), FEV1 1.65 (53%), F/F 69, TLC 75%, RV/TLC 130%, DLCO 65%, DLCO/VA 102% Moderate to severe obstructive airway disease with no bronchodilator response. Moderate diffusion impairment which corrects for alveolar volume Mild reduction in total lung capacity, airtrapping.  FENO 05/29/16- 12 FENO 07/30/16- 9  Labs CBC with diff 06/04/16- WBC 13.6. Eos 2%, absolute eos count 272 Blood allergy profile- IgE 133, sensitive to weed, grass and elm.  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 Grade 1   diastolic dysfunction. Mild left atrial enlargement. Trivial   tricuspid regurgitation.  Echo 03/17/17 LV cavity is vigorous with near cavity obliteration, LVOT is narrow.  Moderate LVH, grade 1  diastolic dysfunction  Stress test 03/25/16- intermediate risk study, nuclear stress to EF 38%.  Significant hypertensive response to exercise  Cardiopulmonary exercise test 04/07/17 The interpretation of this test is greatly limited due to submaximal effort during the exercise. Based on available data, exercise testing with gas exchange demonstrates mild functional impairment when compared to matched sedentary norms. There is no clear indication for circulatory limitation, however there was a marked hypertensive response at peak exercise. Pre-exercise spirometry demonstrates mild restrictive properties which are very likely related to his body habitus. At peak exercise and measurements IPE, patient appears mildly limited due to exercise-induced bronchospasm.   Sleep eval Sleep study 08/05/16 Mild to moderate obstructive sleep apnea occurred during the diagnostic portion of the study (AHI = 14.4 /hour). CPAP titration was attempted but resulted in worsening apneic events. A formal CPAP/BiPAP titration study is recommended.  Titration study 11/07/16 He did not tolerate CPAP due to difficulty exhaling against the pressure. Trial of BiPAP therapy on 8/4 cm H2O with a Medium size Resmed Nasal Pillow Mask AirFit P10 mask and heated humidification  Assessment:  COPD GOLD B, Asthma Review of his PFTs shows obstruction with no bronchodilator response. In addition he has air trapping consistent with emphysema.  Cardiopulmonary test shows exercise-induced bronchospasm indicator of a component of asthma Continue on trelegy inhaler, continue albuterol, duo nebs as needed  Suspect his dyspnea is multifactorial from COPD, severe LVH with diastolic dysfunction, deconditioning, obesity. Had a long discussion today about the need for him to be compliant, maintain good blood pressure diabetes control and follow-up with primary care and cardiology  Moderate sleep apnea Noncompliant with BiPAP Emphasized to him  that he needs to use this every day as his recent stroke may be related to untreated sleep apnea  Subcentimeter pulmonary nodule Has not kept up with follow-up CT in January 2020 Reorder CT chest without contrast  Health maintenance Did not want flu or pneumococcus vaccination. He is willing to get the COVID-19 vaccine when available  Plan/Recommendations: - Continue Trelegy - Resume Bipap.  - CT chest without contrast  Marshell Garfinkel MD [facility] 01/11/2019, 10:40 AM  CC: Craig Fire, MD

## 2019-01-11 NOTE — Patient Instructions (Addendum)
I am glad your breathing is stable Make sure that he uses BiPAP every day without fail Continue Trelegy inhaler CT chest without contrast Congrats on quitting smoking.  Follow-up in March 2021

## 2019-01-20 ENCOUNTER — Inpatient Hospital Stay: Admission: RE | Admit: 2019-01-20 | Payer: Medicare HMO | Source: Ambulatory Visit

## 2019-02-18 DIAGNOSIS — Z0001 Encounter for general adult medical examination with abnormal findings: Secondary | ICD-10-CM | POA: Diagnosis not present

## 2019-02-18 DIAGNOSIS — I2510 Atherosclerotic heart disease of native coronary artery without angina pectoris: Secondary | ICD-10-CM | POA: Diagnosis not present

## 2019-02-18 DIAGNOSIS — Z1331 Encounter for screening for depression: Secondary | ICD-10-CM | POA: Diagnosis not present

## 2019-02-18 DIAGNOSIS — Z1389 Encounter for screening for other disorder: Secondary | ICD-10-CM | POA: Diagnosis not present

## 2019-02-18 DIAGNOSIS — J449 Chronic obstructive pulmonary disease, unspecified: Secondary | ICD-10-CM | POA: Diagnosis not present

## 2019-02-18 DIAGNOSIS — F17210 Nicotine dependence, cigarettes, uncomplicated: Secondary | ICD-10-CM | POA: Diagnosis not present

## 2019-03-21 DIAGNOSIS — I2510 Atherosclerotic heart disease of native coronary artery without angina pectoris: Secondary | ICD-10-CM | POA: Diagnosis not present

## 2019-03-21 DIAGNOSIS — E119 Type 2 diabetes mellitus without complications: Secondary | ICD-10-CM | POA: Diagnosis not present

## 2020-06-02 ENCOUNTER — Other Ambulatory Visit: Payer: Self-pay | Admitting: Surgery

## 2020-06-02 DIAGNOSIS — K429 Umbilical hernia without obstruction or gangrene: Secondary | ICD-10-CM

## 2020-06-14 ENCOUNTER — Ambulatory Visit (INDEPENDENT_AMBULATORY_CARE_PROVIDER_SITE_OTHER): Payer: Medicare Other | Admitting: Pulmonary Disease

## 2020-06-14 ENCOUNTER — Encounter: Payer: Self-pay | Admitting: Pulmonary Disease

## 2020-06-14 ENCOUNTER — Other Ambulatory Visit: Payer: Self-pay

## 2020-06-14 VITALS — BP 150/82 | HR 82 | Temp 97.8°F | Ht 67.0 in | Wt 227.6 lb

## 2020-06-14 DIAGNOSIS — R911 Solitary pulmonary nodule: Secondary | ICD-10-CM

## 2020-06-14 DIAGNOSIS — J449 Chronic obstructive pulmonary disease, unspecified: Secondary | ICD-10-CM | POA: Diagnosis not present

## 2020-06-14 DIAGNOSIS — G4733 Obstructive sleep apnea (adult) (pediatric): Secondary | ICD-10-CM

## 2020-06-14 MED ORDER — TRELEGY ELLIPTA 100-62.5-25 MCG/INH IN AEPB: 1.0000 | INHALATION_SPRAY | Freq: Every day | RESPIRATORY_TRACT | 0 refills | Status: DC

## 2020-06-14 NOTE — Patient Instructions (Addendum)
You are at higher risk than normal for surgery but I do not see any contraindications Cough start using the Trelegy inhaler on a regular basis.  Start using BiPAP on a regular basis Quit smoking before surgery  He had a CT abdomen pelvis scheduled for 5/3.  We will add a CT chest for lung nodules to be done at the same time.  Return to clinic in 3 months.

## 2020-06-14 NOTE — Progress Notes (Addendum)
Craig Knight    [id number]    05/07/1965  Primary Care Physician:Fanta, Brandon Melnick, MD  Referring Physician: Rosita Fire, MD [address] [place],  [place] [address]  Chief complaint:   Follow up for  COPD GOLD B (mmRC 3, 0 exacerbations) Asthma Non compliance  HPI: Craig Knight is 55 year old with past medical history of COPD, asthma, hypertension, DM, diastolic active smoker. He was diagnosed with asthma as a child which got better in adulthood. 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 He reports mainly dyspnea on exertion and does not have dyspnea on rest, wheezing. He has daily symptoms of cough with white sputum production.nHe does not have seasonal allergies, sensitivities to cats, dogs, strong perfumes, no heartburn symptoms.  Hospitalized in September 2020 for small strokes.  He is followed up with neurology Dr. Merlene Laughter.  MRI attempted as an outpatient but could not complete due to anxiety.  He has over 40-pack-year smoking history.  Continues to smoke a few cigarettes every day Currently disabled due to dyspnea.  Interim history: Returns to clinic after period of 2 years.  He is being evaluated for umbilical hernia repair and needs preop assessment  Continues to smoke 2 to 3 cigarettes a day, intermittently compliant with Trelegy inhaler and BiPAP He has chronic dyspnea on exertion, cough with sputum production.  Outpatient Encounter Medications as of 06/14/2020  Medication Sig  . albuterol (VENTOLIN HFA) 108 (90 Base) MCG/ACT inhaler Inhale 2 puffs into the lungs every 6 (six) hours as needed for wheezing or shortness of breath.  Marland Kitchen amLODipine (NORVASC) 10 MG tablet Take 1 tablet (10 mg total) by mouth daily for 30 days.  Marland Kitchen atorvastatin (LIPITOR) 40 MG tablet Take 1 tablet (40 mg total) by mouth daily.  . chlorthalidone (HYGROTON) 25 MG tablet Take 1 tablet (25 mg total) by  mouth daily for 30 days.  . clopidogrel (PLAVIX) 75 MG tablet Take 1 tablet by mouth daily.  . DULoxetine (CYMBALTA) 30 MG capsule TAKE 1 CAPSULE BY MOUTH DAILY FOR 1 WEEK THEN 2 CAPSULES DAILY  . Fluticasone-Umeclidin-Vilant (TRELEGY ELLIPTA) 100-62.5-25 MCG/INH AEPB Inhale 1 puff into the lungs daily.  Marland Kitchen gabapentin (NEURONTIN) 300 MG capsule Take 300 mg by mouth 2 (two) times daily.  Marland Kitchen ipratropium-albuterol (DUONEB) 0.5-2.5 (3) MG/3ML SOLN Take 3 mLs by nebulization every 4 (four) hours as needed.  . isosorbide mononitrate (IMDUR) 60 MG 24 hr tablet Take 1 tablet (60 mg total) by mouth daily.  Marland Kitchen losartan (COZAAR) 100 MG tablet Take 100 mg by mouth daily.  . metFORMIN (GLUCOPHAGE) 500 MG tablet Take 1 tablet (500 mg total) by mouth daily with breakfast.  . nitroGLYCERIN (NITROSTAT) 0.4 MG SL tablet Place 1 tablet (0.4 mg total) under the tongue every 5 (five) minutes as needed for chest pain.  Marland Kitchen omeprazole (PRILOSEC) 40 MG capsule Take 40 mg by mouth daily.  . tadalafil (CIALIS) 20 MG tablet Take 20 mg by mouth daily as needed for erectile dysfunction.  Marland Kitchen aspirin EC 81 MG tablet Take 1 tablet (81 mg total) by mouth daily. (Patient not taking: Reported on 06/14/2020)  . [DISCONTINUED] hydrALAZINE (APRESOLINE) 50 MG tablet Take 50 mg by mouth 2 (two) times daily.   No facility-administered encounter medications on file as of 06/14/2020.   Physical Exam: Blood pressure (!) 150/82, pulse 82, temperature 97.8 F (36.6 C), temperature source  Temporal, height 5\' 7"  (1.702 m), weight 227 lb 9.6 oz (103.2 kg), SpO2 98 %. Gen:      No acute distress HEENT:  EOMI, sclera anicteric Neck:     No masses; no thyromegaly Lungs:    Clear to auscultation bilaterally; normal respiratory effort CV:         Regular rate and rhythm; no murmurs Abd:      + bowel sounds; soft, non-tender; no palpable masses, no distension Ext:    No edema; adequate peripheral perfusion Skin:      Warm and dry; no rash Neuro:  alert and oriented x 3 Psych: normal mood and affect  Data Reviewed: Imaging CT abdomen 06/04/16- mild left base atelectasis with scarring. No acute infiltrate or fibrosis Coronary 01/08/17- Lung images show 4 mm right middle lobe lung nodule with no acute lung infiltrate or consolidation. CT angiogram 03/10/17-no pulmonary embolism, prominence of main pulmonary outflow tract.  Subcentimeter pulmonary nodule.  The largest measuring 4 mm. I reviewed all images personally.  PFTs PFTs 04/24/16 FVC 2.01 (62%), FEV1 1.65 (53%), F/F 69, TLC 75%, RV/TLC 130%, DLCO 65%, DLCO/VA 102% Moderate to severe obstructive airway disease with no bronchodilator response. Moderate diffusion impairment which corrects for alveolar volume Mild reduction in total lung capacity, airtrapping.  FENO 05/29/16- 12 FENO 07/30/16- 9  Labs CBC with diff 06/04/16- WBC 13.6. Eos 2%, absolute eos count 272 Blood allergy profile- IgE 133, sensitive to weed, grass and elm.  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 Grade 1   diastolic dysfunction. Mild left atrial enlargement. Trivial   tricuspid regurgitation.  Echo 03/17/17 LV cavity is vigorous with near cavity obliteration, LVOT is narrow.  Moderate LVH, grade 1 diastolic dysfunction  Stress test 03/25/16- intermediate risk study, nuclear stress to EF 38%.  Significant hypertensive response to exercise  Cardiopulmonary exercise test 04/07/17 The interpretation of this test is greatly limited due to submaximal effort during the exercise. Based on available data, exercise testing with gas exchange demonstrates mild functional impairment when compared to matched sedentary norms. There is no clear indication for circulatory limitation, however there was a marked hypertensive response at peak exercise. Pre-exercise spirometry demonstrates mild  restrictive properties which are very likely related to his body habitus. At peak exercise and measurements IPE, patient appears mildly limited due to exercise-induced bronchospasm.   Sleep eval Sleep study 08/05/16 Mild to moderate obstructive sleep apnea occurred during the diagnostic portion of the study (AHI = 14.4 /hour). CPAP titration was attempted but resulted in worsening apneic events. A formal CPAP/BiPAP titration study is recommended.  Titration study 11/07/16 He did not tolerate CPAP due to difficulty exhaling against the pressure. Trial of BiPAP therapy on 8/4 cm H2O with a Medium size Resmed Nasal Pillow Mask AirFit P10 mask and heated humidification  Assessment:  Preop assessment for umbilical hernia repair He is at intermediate heightened risk for perioperative complications with COPD, OSA, obesity with ongoing noncompliance and smoking However he does not appear to be in exacerbation today and respiratory status appears well compensated.  Peri-operative Assessment of Pulmonary Risk for Non-Thoracic Surgery:  ForMr. Keep, risk of perioperative pulmonary complications is increased by:    COPD  Smoking  Obstructive sleep apnea   Respiratory complications generally occur in 1% of ASA Class I patients, 5% of ASA Class II and 10% of ASA Class III-IV patients These complications rarely  result in mortality and iclude postoperative pneumonia, atelectasis, pulmonary embolism, ARDS and increased time requiring postoperative mechanical ventilation.  Overall, I recommend proceeding with the surgery if the risk for respiratory complications are outweighed by the potential benefits. This will need to be discussed between the patient and surgeon.  To reduce risks of respiratory complications, I recommend: --Smoking cessation 6-8 weeks if possible --Pre- and post-operative incentive spirometry performed frequently while awake --Inpatient use of currently prescribed positive-pressure  for OSA whenever the patient is sleeping --Avoiding use of pancuronium during anesthesia.  I have discussed the risk factors and recommendations above with the patient.  COPD GOLD B, Asthma Review of his PFTs shows obstruction with no bronchodilator response. In addition he has air trapping consistent with emphysema.  Cardiopulmonary test shows exercise-induced bronchospasm indicator of a component of asthma Continue on trelegy inhaler, continue albuterol, duo nebs as needed  Suspect his dyspnea is multifactorial from COPD, severe LVH with diastolic dysfunction, deconditioning, obesity. Had a long discussion today about the need for him to be compliant, maintain good blood pressure diabetes control.   Moderate sleep apnea Noncompliant with BiPAP Emphasized to him that he needs to use this every day as his recent stroke may be related to untreated sleep apnea  Subcentimeter pulmonary nodule Has not kept up with follow-up CT in January 2020 Reorder CT chest without contrast  Health maintenance Did not want flu or pneumococcus vaccination. Has received COVID booster  Plan/Recommendations: - Continue Trelegy - Resume Bipap.  - CT chest without contrast - Preoperative assessment as above  Marshell Garfinkel MD [facility] 06/14/2020, 9:01 AM  CC: Rosita Fire, MD

## 2020-06-14 NOTE — Addendum Note (Signed)
Addended by: Elton Sin on: 06/14/2020 10:07 AM   Modules accepted: Orders

## 2020-06-19 ENCOUNTER — Telehealth: Payer: Self-pay | Admitting: Pulmonary Disease

## 2020-06-19 NOTE — Telephone Encounter (Signed)
Dr. Vaughan Browner has written the note in the [facility] will route to [facility] for faxing over to surgeons office.

## 2020-06-23 NOTE — Telephone Encounter (Signed)
I printed and faxed the note from 06/14/20 and faxed to the number given  Will close encounter

## 2020-06-27 ENCOUNTER — Other Ambulatory Visit: Payer: Self-pay

## 2020-06-27 ENCOUNTER — Ambulatory Visit
Admission: RE | Admit: 2020-06-27 | Discharge: 2020-06-27 | Disposition: A | Discharge status: Home / Self Care | Payer: Medicare Other | Source: Ambulatory Visit | Attending: Pulmonary Disease | Admitting: Pulmonary Disease

## 2020-06-27 ENCOUNTER — Ambulatory Visit
Admission: RE | Admit: 2020-06-27 | Discharge: 2020-06-27 | Disposition: A | Discharge status: Home / Self Care | Payer: Medicare Other | Source: Ambulatory Visit | Attending: Surgery | Admitting: Surgery

## 2020-06-27 DIAGNOSIS — K429 Umbilical hernia without obstruction or gangrene: Secondary | ICD-10-CM

## 2020-06-27 DIAGNOSIS — R911 Solitary pulmonary nodule: Secondary | ICD-10-CM

## 2020-06-27 IMAGING — CT CT ABD-PELV W/ CM
1 of 3 series · 13 of 32 positions shown, 18 images · IV contrast (APPLIED)
Comparison: Abdominal CT [DATE]

CLINICAL DATA: Umbilical hernia.  Bloating.

EXAM:
CT ABDOMEN AND PELVIS WITH CONTRAST
TECHNIQUE: Multidetector CT imaging of the abdomen and pelvis was performed
using the standard protocol following bolus administration of
intravenous contrast.
Creatinine was obtained on site at [HOSPITAL] at [HOSPITAL].
Results: Creatinine 1.8 mg/dL. Hydration instructions given to
patient.
CONTRAST:  80mL [89] IOPAMIDOL ([89]) INJECTION 61%

[Series 2: abd/pelvis w/cm · axial · 0.95mm/px · z∈[-426,-16]mm · 13 of 94 slices shown, 18 images]
[im 6/94  soft-tissue]
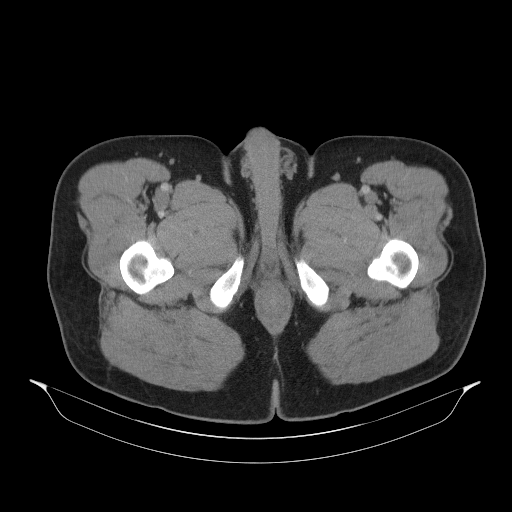
[im 6/94  bone]
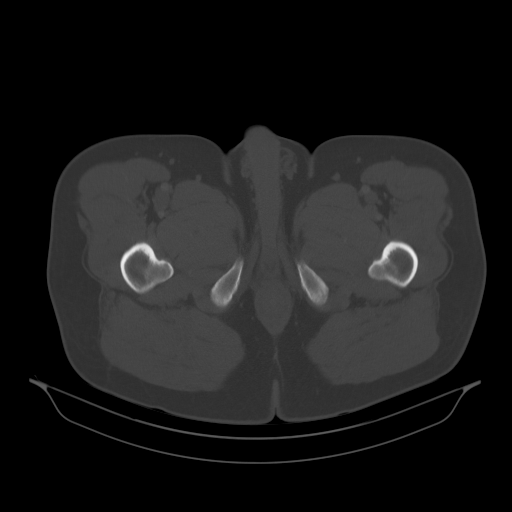
[im 12/94  soft-tissue]
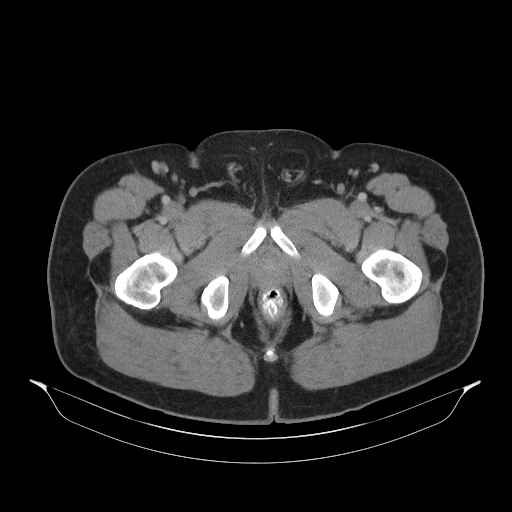
[im 24/94  soft-tissue]
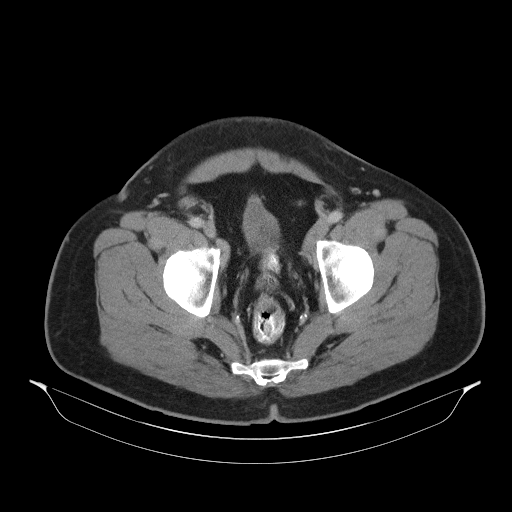
[im 30/94  soft-tissue]
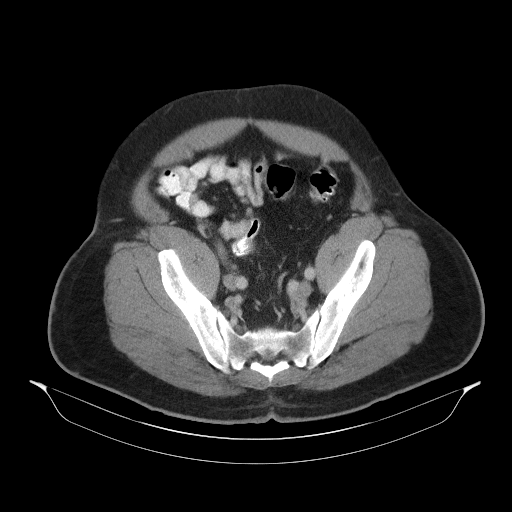
[im 35/94  soft-tissue]
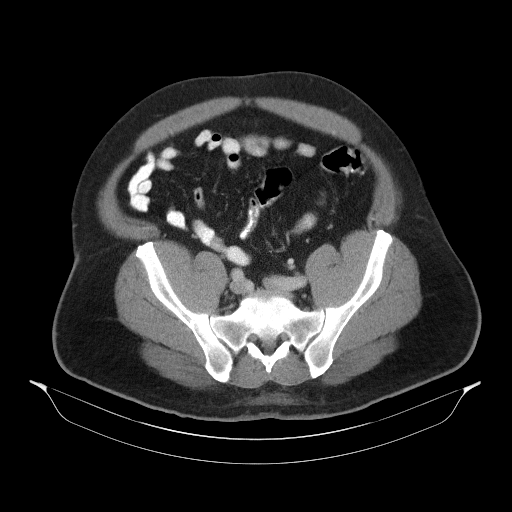
[im 41/94  soft-tissue]
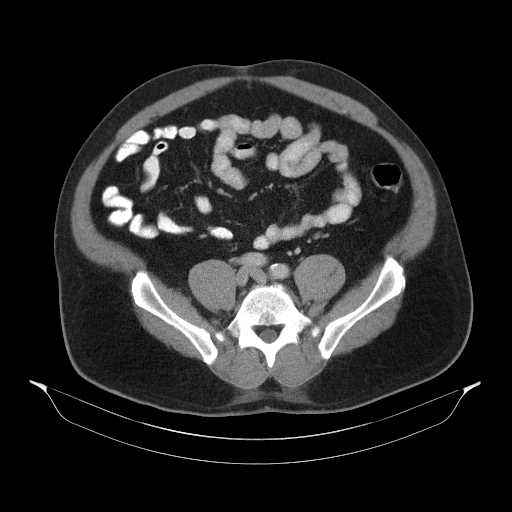
[im 53/94  soft-tissue]
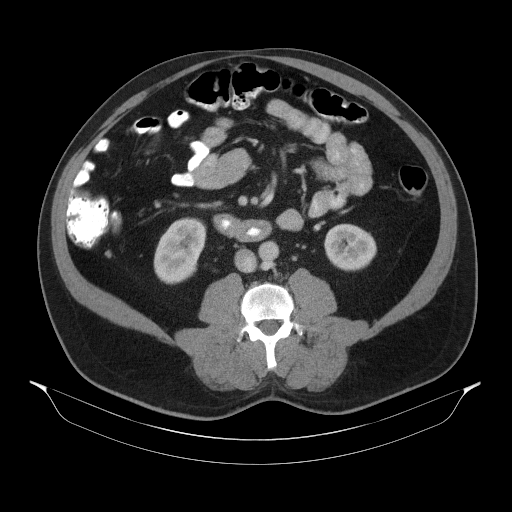
[im 59/94  soft-tissue]
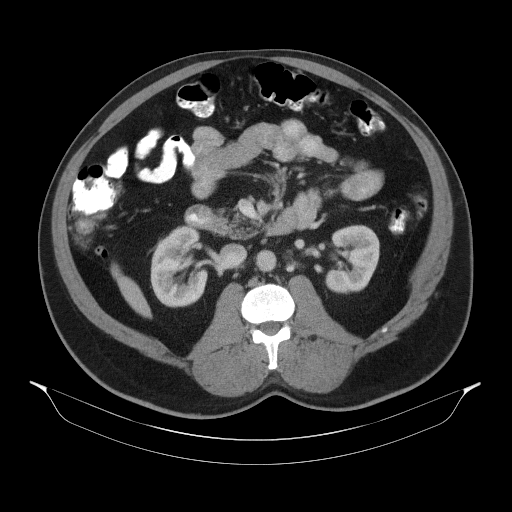
[im 64/94  soft-tissue]
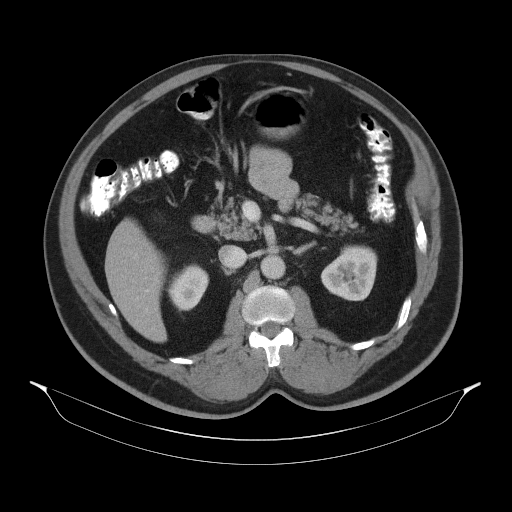
[im 64/94  bone]
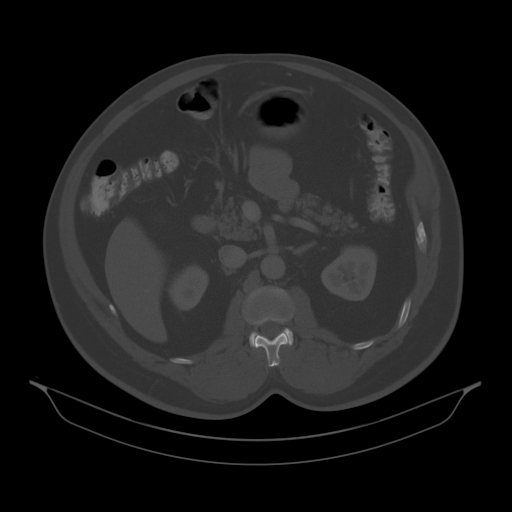
[im 70/94  soft-tissue]
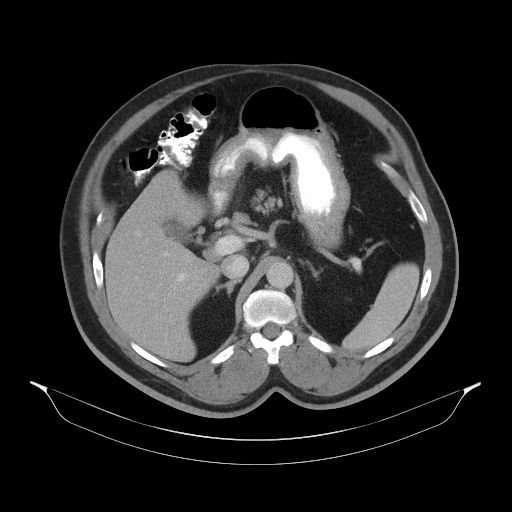
[im 70/94  lung]
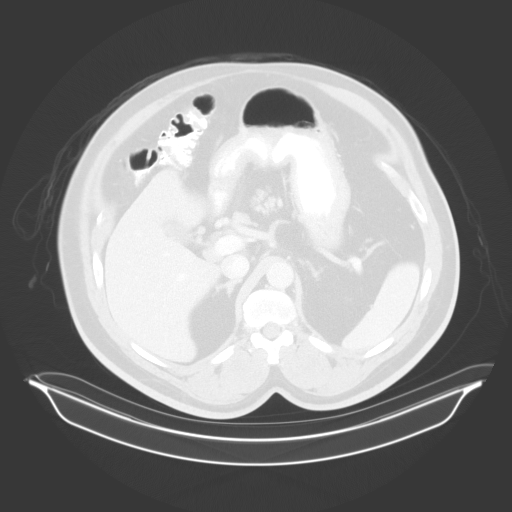
[im 76/94  lung]
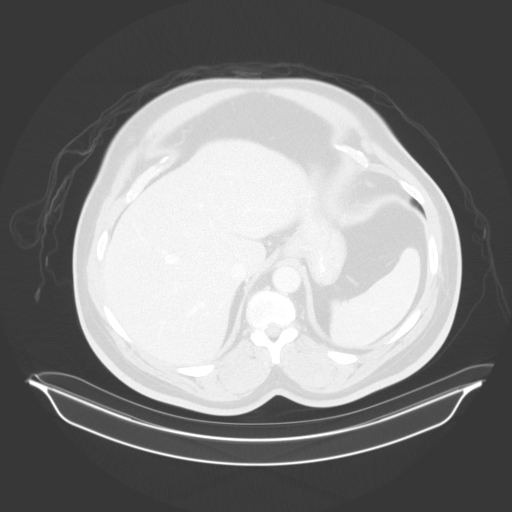
[im 82/94  soft-tissue]
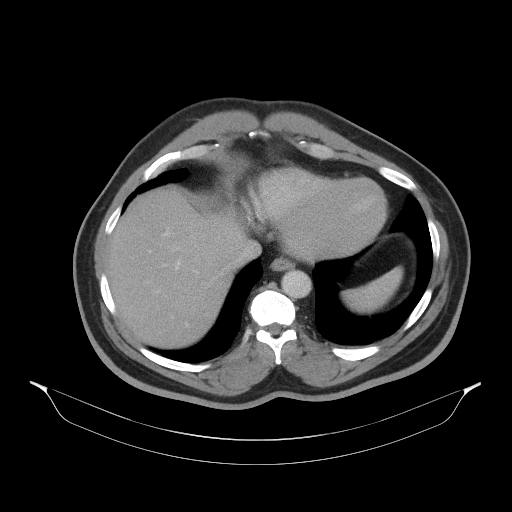
[im 82/94  lung]
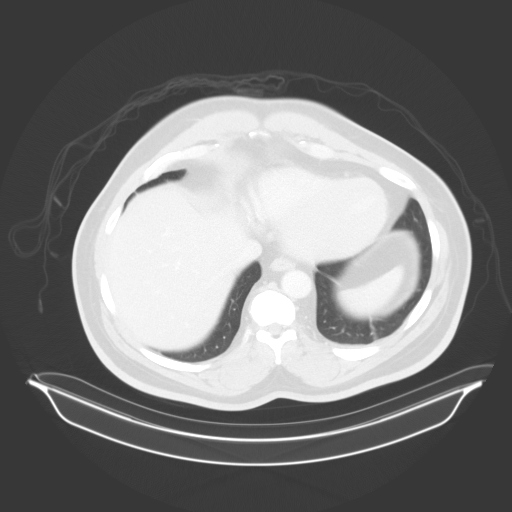
[im 88/94  soft-tissue]
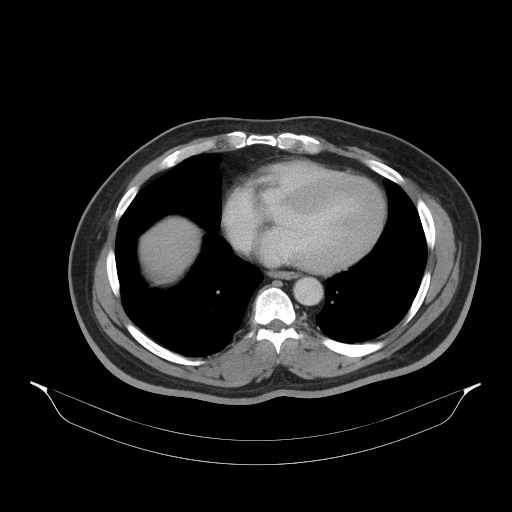
[im 88/94  lung]
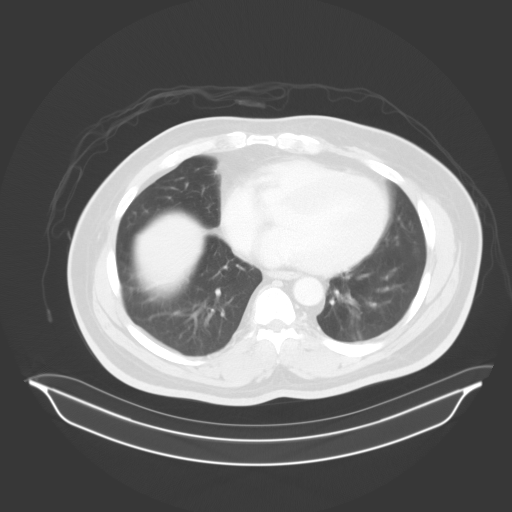

[13 of 32 positions shown; findings below may reference images not displayed]

FINDINGS: Lower chest: Lungs fully assessed on concurrent chest CT, reported
separately.

Hepatobiliary: No focal liver abnormality is seen. No gallstones,
gallbladder wall thickening, or biliary dilatation.

Pancreas: Mild fatty atrophy.  No ductal dilatation or inflammation.

Spleen: Normal in size without focal abnormality.

Adrenals/Urinary Tract: Normal adrenal glands. No hydronephrosis or
perinephric edema. Homogeneous renal enhancement with symmetric
excretion on delayed phase imaging. No visualized renal calculi.
There is an 11 mm cyst in the anterior mid right kidney. No evidence
of solid renal lesion. Urinary bladder is minimally distended
without wall thickening for degree of distension.

Stomach/Bowel: Occasional areas of gastric and duodenal wall
thickening without adjacent inflammation. Normal positioning of the
duodenum and ligament of Treitz. Decompressed small bowel without
obstruction, wall thickening, or inflammatory change. High-riding
cecum in the right upper quadrant. Normal appendix. Small volume of
colonic stool. Descending and proximal sigmoid diverticulosis
without diverticulitis.

Vascular/Lymphatic: Mild aortic atherosclerosis. No aneurysm.
Circumaortic left renal vein. Patent portal vein. 17 mm periportal
lymph node is likely reactive. No other enlarged lymph nodes in the
abdomen or pelvis.

Reproductive: Prostate is unremarkable.

Other: Possible tiny abdominal wall defect at the umbilicus but no
significant herniation of fat. No bowel involvement or fat
stranding. There is fat within both inguinal canals, right greater
than left. No ascites. No abdominopelvic fluid collection.

Musculoskeletal: There are no acute or suspicious osseous
abnormalities.
IMPRESSION: 1. Possible tiny abdominal wall defect at the umbilicus but no
significant herniation of fat. No bowel involvement or fat
stranding.
2. Occasional areas of gastric and duodenal wall thickening without
adjacent inflammation, suggesting peptic ulcer disease.
3. Minimal colonic diverticulosis without diverticulitis.
4. Fat within both inguinal canals, right greater than left.

Aortic Atherosclerosis ([89]-[89]).

## 2020-06-27 IMAGING — CT CT CHEST W/O CM
1 of 2 series · 15 of 32 positions shown, 19 images · non-contrast
Comparison: [DATE]

CLINICAL DATA: Follow-up lung nodules.

EXAM:
CT CHEST WITHOUT CONTRAST
TECHNIQUE: Multidetector CT imaging of the chest was performed following the
standard protocol without IV contrast.

[Series 2: chest w/(date) · axial · 0.88mm/px · z∈[-346,-46]mm · 15 of 174 slices shown, 19 images]
[im 12/174  mediastinal]
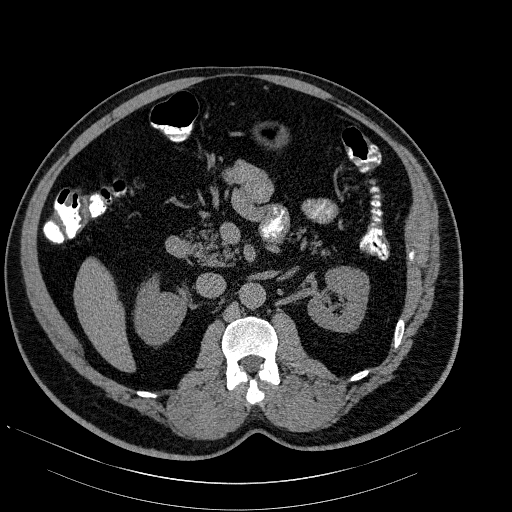
[im 12/174  lung]
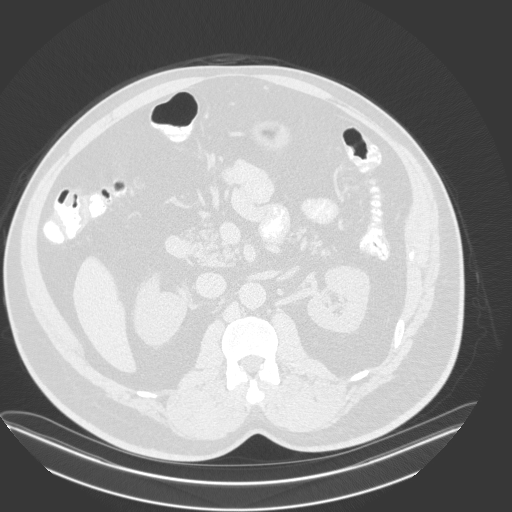
[im 24/174  lung]
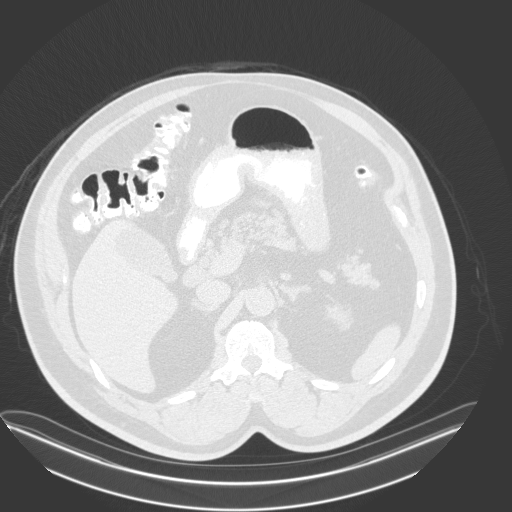
[im 35/174  lung]
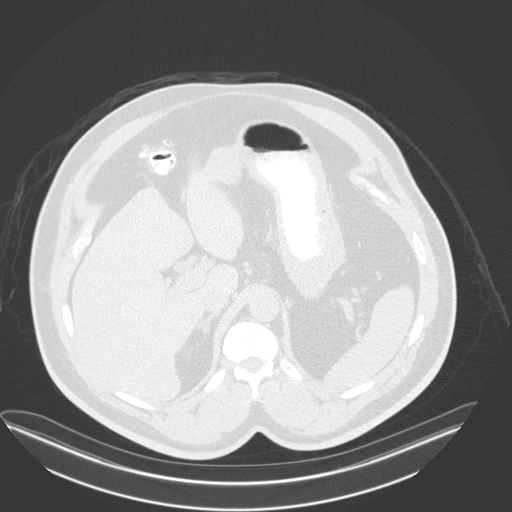
[im 47/174  lung]
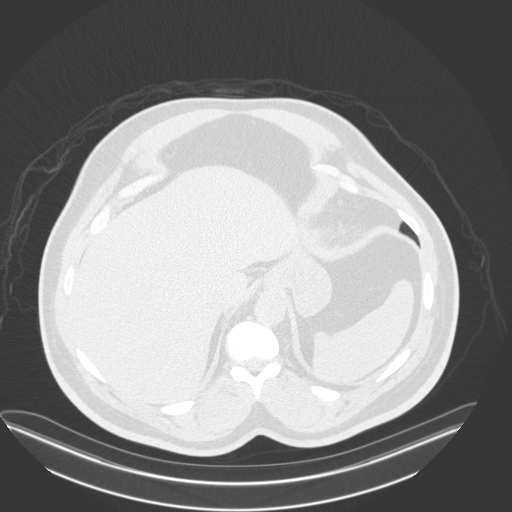
[im 58/174  mediastinal]
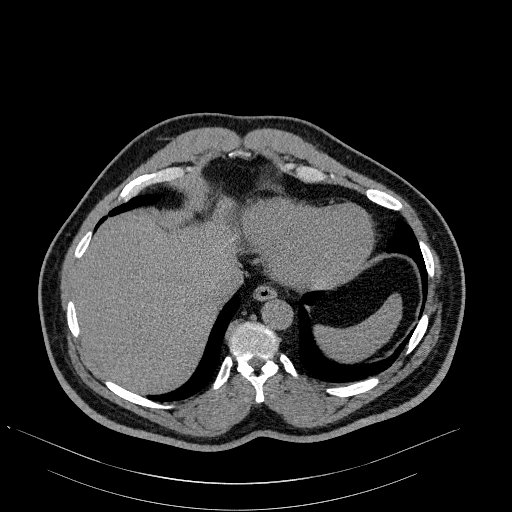
[im 58/174  lung]
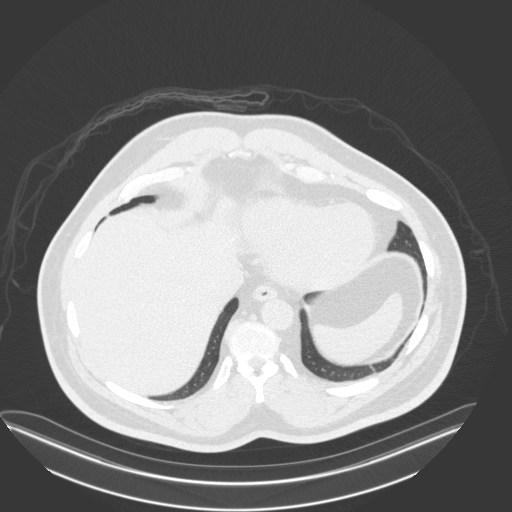
[im 70/174  lung]
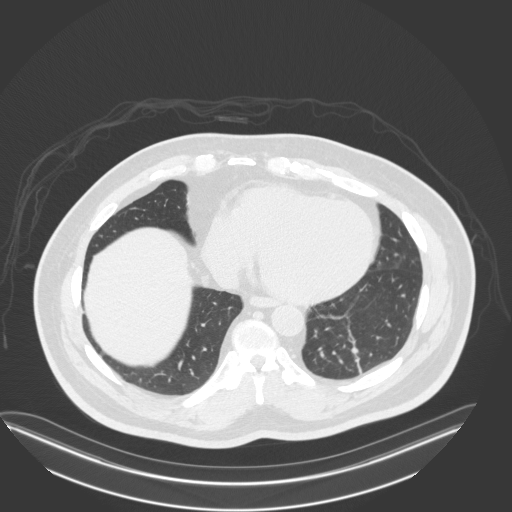
[im 81/174  lung]
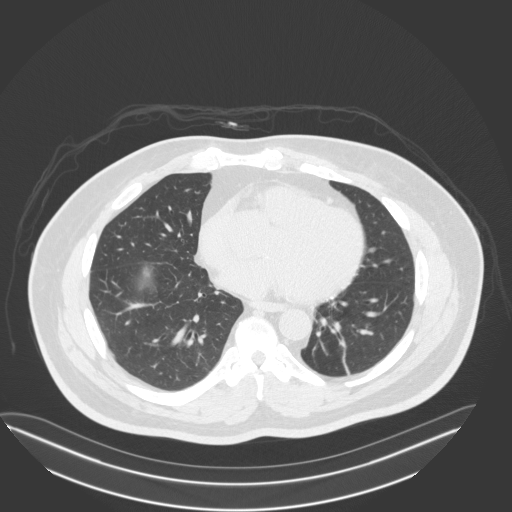
[im 87/174  lung]
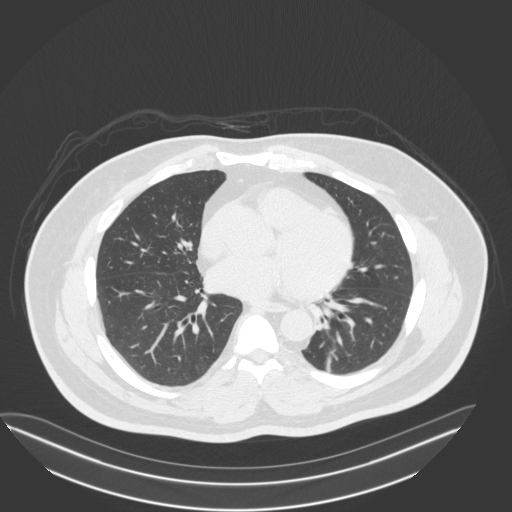
[im 93/174  mediastinal]
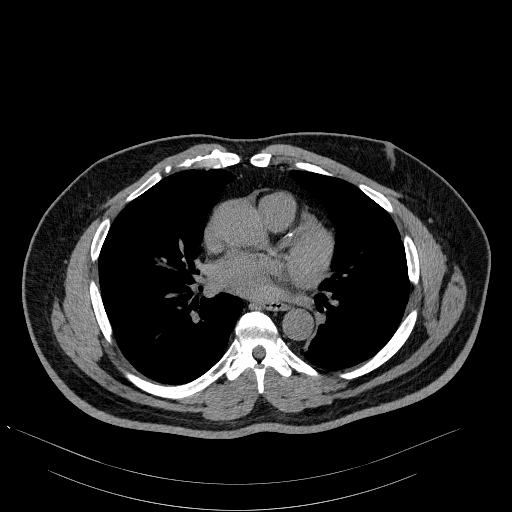
[im 93/174  lung]
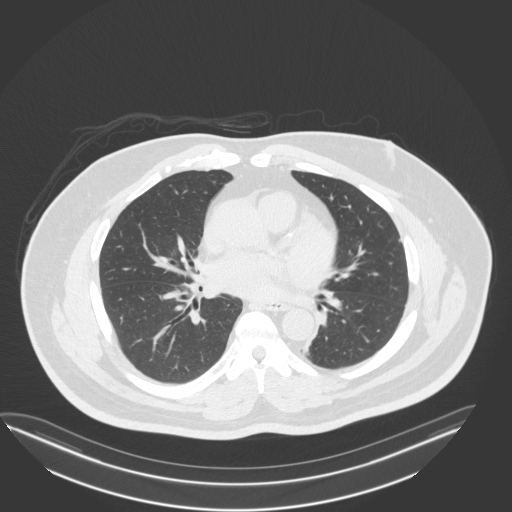
[im 104/174  lung]
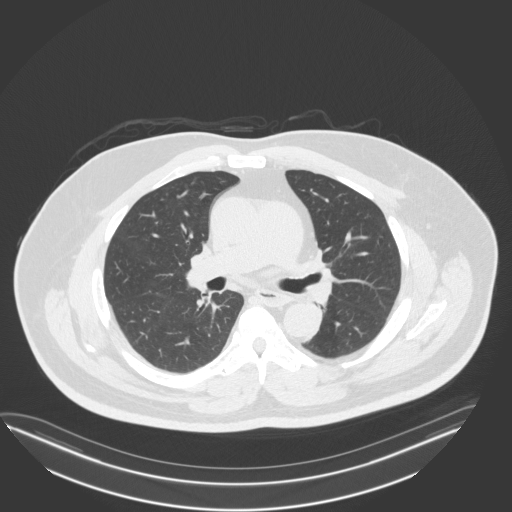
[im 116/174  lung]
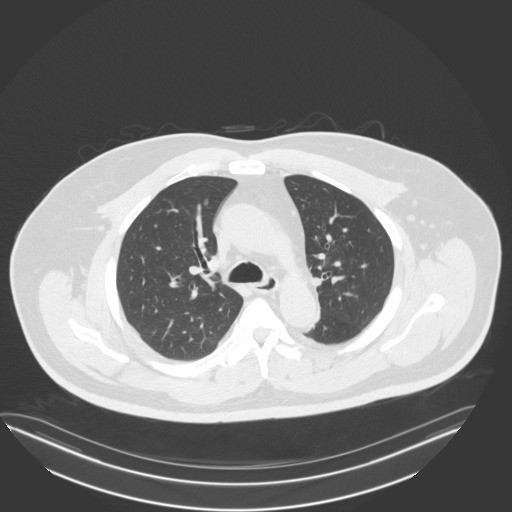
[im 127/174  lung]
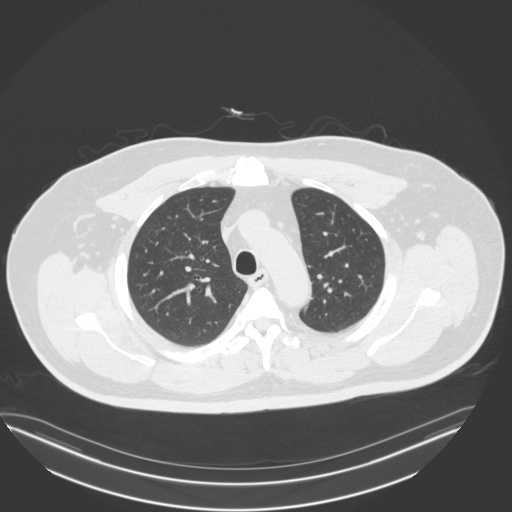
[im 139/174  mediastinal]
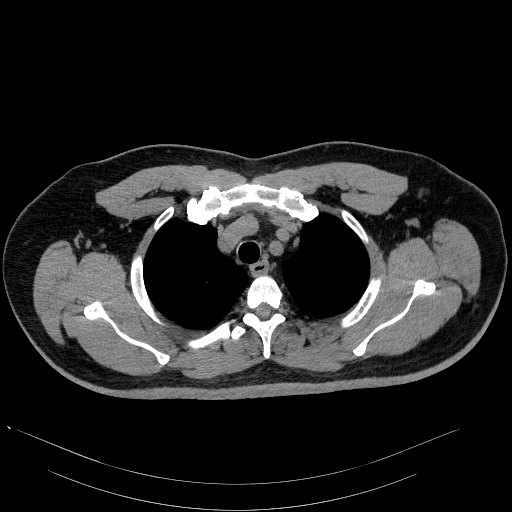
[im 139/174  lung]
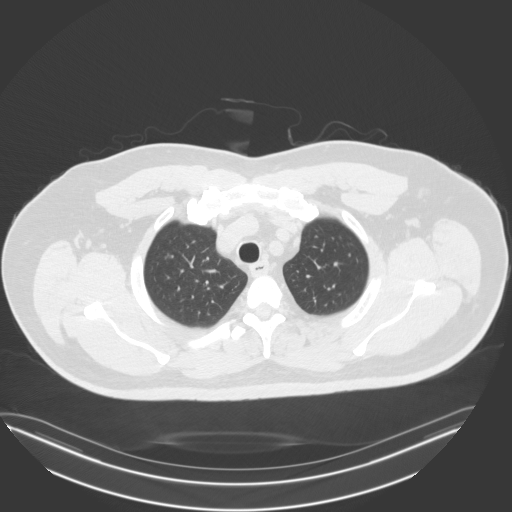
[im 150/174  lung]
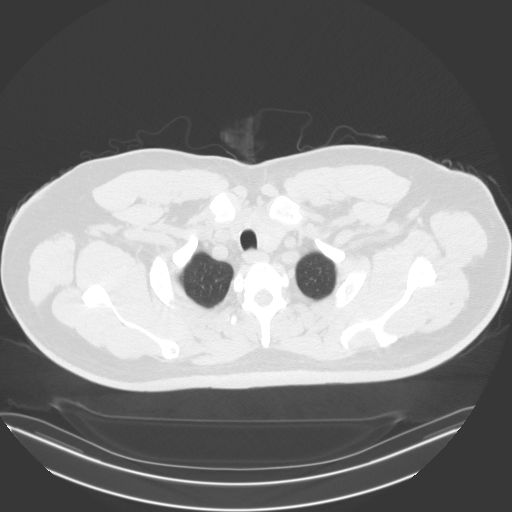
[im 162/174  lung]
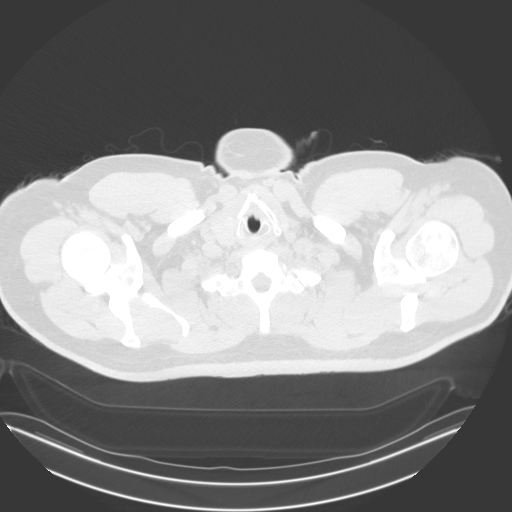

[15 of 32 positions shown; findings below may reference images not displayed]

FINDINGS: Cardiovascular: Heart size is upper limits of normal in caliber. No
pericardial effusion. Aortic atherosclerosis. Coronary artery
atherosclerotic calcifications. Increase caliber of the main
pulmonary artery measures 4.1 cm consistent with PA hypertension.

Mediastinum/Nodes: No enlarged mediastinal or axillary lymph nodes.
Thyroid gland, trachea, and esophagus demonstrate no significant
findings.

Lungs/Pleura: No pleural effusion. No airspace consolidation. Small
scattered lung nodules are identified bilaterally.

Index perifissural nodule along the minor fissure of the right lung
is stable at 4 mm, image 77/5.

Previously noted subpleural nodule within the periphery of the left
upper lobe has resolved.

Right middle lobe lung nodule measures 3 mm, image 100/5.  Stable.

3 mm nodule within the posterior right lower lobe was not
confidently noted on the previous exam, image 86/5.

Lateral right apex nodule measures 3 mm, image 39/5. Posterolateral
right apex nodule measures 2 mm, image 34/5. Not confidently
identified on the prior exam.

Anterior right apical nodule measuring 3 mm was also not confidently
noted on the previous exam.

Upper Abdomen: No acute abnormality.

Musculoskeletal: No chest wall mass or suspicious bone lesions
identified.
IMPRESSION: 1. Previously noted lung nodules are either stable or resolved in
the interval.
2. Several new tiny nonspecific lung nodules which are new since the
previous study measuring up to 3 mm. No follow-up needed if patient
is low-risk (and has no known or suspected primary neoplasm).
Non-contrast chest CT can be considered in 12 months if patient is
high-risk. This recommendation follows the consensus statement:
Guidelines for Management of Incidental Pulmonary Nodules Detected
[DATE].
3. Increase caliber of the main pulmonary artery consistent with PA
hypertension.
4. Coronary artery calcifications.
5.  Aortic Atherosclerosis ([IP]-[IP]).

## 2020-06-27 MED ORDER — IOPAMIDOL (ISOVUE-300) INJECTION 61%
80.0000 mL | Freq: Once | INTRAVENOUS | Status: AC | PRN
  Administered 2020-06-27: 80 mL via INTRAVENOUS

## 2020-06-28 ENCOUNTER — Other Ambulatory Visit: Payer: Self-pay | Admitting: *Deleted

## 2020-06-28 DIAGNOSIS — R911 Solitary pulmonary nodule: Secondary | ICD-10-CM

## 2020-08-23 ENCOUNTER — Ambulatory Visit: Payer: Self-pay | Admitting: Surgery

## 2020-08-23 NOTE — Pre-Procedure Instructions (Signed)
Surgical Instructions    Your procedure is scheduled on Tuesday July 12th.  Report to [facility] Main Entrance "A" at 1:00 P.M., then check in with the Admitting office.  Call this number if you have problems the morning of surgery:  [phone]   If you have any questions prior to your surgery date call [phone]: Open Monday-Friday 8am-4pm    Remember:  Do not eat or drink after midnight the night before your surgery     Take these medicines the morning of surgery with A SIP OF WATER   albuterol (VENTOLIN HFA)- If needed  amLODipine (NORVASC)  atorvastatin (LIPITOR)  Fluticasone-Umeclidin-Vilant (TRELEGY ELLIPTA)- If needed  ipratropium-albuterol (DUONEB)- If needed  isosorbide mononitrate (IMDUR)   nitroGLYCERIN (NITROSTAT)- If needed  omeprazole (PRILOSEC)   As of today, STOP taking any Aspirin (unless otherwise instructed by your surgeon) Aleve, Naproxen, Ibuprofen, Motrin, Advil, Goody's, BC's, all herbal medications, fish oil, and all vitamins.  Please follow your surgeon's instructions regarding Aspirin and Plavix. If you not received instructions then you will need to contact your surgeon's office.   WHAT DO I DO ABOUT MY DIABETES MEDICATION?   Do not take oral diabetes medicines (pills) the morning of surgery. DO NOT TAKE metFORMIN (GLUCOPHAGE) the morning of surgery.   The day of surgery, do not take other diabetes injectables, including Byetta (exenatide), Bydureon (exenatide ER), Victoza (liraglutide), or Trulicity (dulaglutide).  HOW TO MANAGE YOUR DIABETES BEFORE AND AFTER SURGERY  Why is it important to control my blood sugar before and after surgery? Improving blood sugar levels before and after surgery helps healing and can limit problems. A way of improving blood sugar control is eating a healthy diet by:  Eating less sugar and carbohydrates  Increasing activity/exercise  Talking with your doctor about reaching your blood sugar goals High blood  sugars (greater than 180 mg/dL) can raise your risk of infections and slow your recovery, so you will need to focus on controlling your diabetes during the weeks before surgery. Make sure that the doctor who takes care of your diabetes knows about your planned surgery including the date and location.  How do I manage my blood sugar before surgery? Check your blood sugar at least 4 times a day, starting 2 days before surgery, to make sure that the level is not too high or low.  Check your blood sugar the morning of your surgery when you wake up and every 2 hours until you get to the Short Stay unit.  If your blood sugar is less than 70 mg/dL, you will need to treat for low blood sugar: Do not take insulin. Treat a low blood sugar (less than 70 mg/dL) with  cup of clear juice (cranberry or apple), 4 glucose tablets, OR glucose gel. Recheck blood sugar in 15 minutes after treatment (to make sure it is greater than 70 mg/dL). If your blood sugar is not greater than 70 mg/dL on recheck, call [phone] for further instructions. Report your blood sugar to the short stay nurse when you get to Short Stay.  If you are admitted to the hospital after surgery: Your blood sugar will be checked by the staff and you will probably be given insulin after surgery (instead of oral diabetes medicines) to make sure you have good blood sugar levels. The goal for blood sugar control after surgery is 80-180 mg/dL.           Do not wear jewelry or makeup Do not wear lotions, powders, perfumes/colognes,  or deodorant. Do not shave 48 hours prior to surgery.  Men may shave face and neck. Do not bring valuables to the hospital. DO Not wear nail polish, gel polish, artificial nails, or any other type of covering on natural nails including finger and toenails. If patients have artificial nails, gel coating, etc. that need to be removed by a nail salon please have this removed prior to surgery or surgery may need to be  canceled/delayed if the surgeon/ anesthesia feels like the patient is unable to be adequately monitored.             [facility] is not responsible for any belongings or valuables.  Do NOT Smoke (Tobacco/Vaping) or drink Alcohol 24 hours prior to your procedure If you use a CPAP at night, you may bring all equipment for your overnight stay.   Contacts, glasses, dentures or partials may not be worn into surgery, please bring cases for these belongings   For patients admitted to the hospital, discharge time will be determined by your treatment team.   Patients discharged the day of surgery will not be allowed to drive home, and someone needs to stay with them for 24 hours.  ONLY 1 SUPPORT PERSON MAY BE PRESENT WHILE YOU ARE IN SURGERY. IF YOU ARE TO BE ADMITTED ONCE YOU ARE IN YOUR ROOM YOU WILL BE ALLOWED TWO (2) VISITORS.  Minor children may have two parents present. Special consideration for safety and communication needs will be reviewed on a case by case basis.  Special instructions:    Oral Hygiene is also important to reduce your risk of infection.  Remember - BRUSH YOUR TEETH THE MORNING OF SURGERY WITH YOUR REGULAR TOOTHPASTE   [facility]- Preparing For Surgery  Before surgery, you can play an important role. Because skin is not sterile, your skin needs to be as free of germs as possible. You can reduce the number of germs on your skin by washing with CHG (chlorahexidine gluconate) Soap before surgery.  CHG is an antiseptic cleaner which kills germs and bonds with the skin to continue killing germs even after washing.     Please do not use if you have an allergy to CHG or antibacterial soaps. If your skin becomes reddened/irritated stop using the CHG.  Do not shave (including legs and underarms) for at least 48 hours prior to first CHG shower. It is OK to shave your face.  Please follow these instructions carefully.     Shower the NIGHT BEFORE SURGERY and the MORNING OF SURGERY  with CHG Soap.   If you chose to wash your hair, wash your hair first as usual with your normal shampoo. After you shampoo, rinse your hair and body thoroughly to remove the shampoo.  Then [employer] and genitals (private parts) with your normal soap and rinse thoroughly to remove soap.  After that Use CHG Soap as you would any other liquid soap. You can apply CHG directly to the skin and wash gently with a scrungie or a clean washcloth.   Apply the CHG Soap to your body ONLY FROM THE NECK DOWN.  Do not use on open wounds or open sores. Avoid contact with your eyes, ears, mouth and genitals (private parts). Wash Face and genitals (private parts)  with your normal soap.   Wash thoroughly, paying special attention to the area where your surgery will be performed.  Thoroughly rinse your body with warm water from the neck down.  DO NOT shower/wash with  your normal soap after using and rinsing off the CHG Soap.  Pat yourself dry with a CLEAN TOWEL.  Wear CLEAN PAJAMAS to bed the night before surgery  Place CLEAN SHEETS on your bed the night before your surgery  DO NOT SLEEP WITH PETS.   Day of Surgery:  Take a shower with CHG soap. Wear Clean/Comfortable clothing the morning of surgery Do not apply any deodorants/lotions.   Remember to brush your teeth WITH YOUR REGULAR TOOTHPASTE.   Please read over the following fact sheets that you were given.

## 2020-08-24 ENCOUNTER — Encounter (HOSPITAL_COMMUNITY): Payer: Self-pay | Admitting: Certified Registered"

## 2020-08-24 ENCOUNTER — Other Ambulatory Visit: Payer: Self-pay

## 2020-08-24 ENCOUNTER — Encounter (HOSPITAL_COMMUNITY)
Admission: RE | Admit: 2020-08-24 | Discharge: 2020-08-24 | Disposition: A | Discharge status: Home / Self Care | Payer: Medicare Other | Source: Ambulatory Visit | Attending: Surgery | Admitting: Surgery

## 2020-08-24 ENCOUNTER — Encounter (HOSPITAL_COMMUNITY): Payer: Self-pay

## 2020-08-24 ENCOUNTER — Encounter (HOSPITAL_COMMUNITY): Payer: Self-pay | Admitting: Physician Assistant

## 2020-08-24 DIAGNOSIS — R011 Cardiac murmur, unspecified: Secondary | ICD-10-CM

## 2020-08-24 DIAGNOSIS — Z01818 Encounter for other preprocedural examination: Secondary | ICD-10-CM | POA: Insufficient documentation

## 2020-08-24 HISTORY — DX: Cerebral infarction, unspecified: I63.9

## 2020-08-24 HISTORY — DX: Cardiac murmur, unspecified: R01.1

## 2020-08-24 HISTORY — DX: Sleep apnea, unspecified: G47.30

## 2020-08-24 LAB — HEMOGLOBIN A1C
Hgb A1c MFr Bld: 7.4 % — ABNORMAL HIGH (ref 4.8–5.6)
Mean Plasma Glucose: 166 mg/dL

## 2020-08-24 LAB — CBC
HCT: 47.8 % (ref 39.0–52.0)
Hemoglobin: 15.7 g/dL (ref 13.0–17.0)
MCH: 26.9 pg (ref 26.0–34.0)
MCHC: 32.8 g/dL (ref 30.0–36.0)
MCV: 81.8 fL (ref 80.0–100.0)
Platelets: 232 10*3/uL (ref 150–400)
RBC: 5.84 MIL/uL — ABNORMAL HIGH (ref 4.22–5.81)
RDW: 14.5 % (ref 11.5–15.5)
WBC: 9.5 10*3/uL (ref 4.0–10.5)
nRBC: 0 % (ref 0.0–0.2)

## 2020-08-24 LAB — GLUCOSE, CAPILLARY: Glucose-Capillary: 198 mg/dL — ABNORMAL HIGH (ref 70–99)

## 2020-08-24 LAB — BASIC METABOLIC PANEL
Anion gap: 11 (ref 5–15)
BUN: 19 mg/dL (ref 6–20)
CO2: 22 mmol/L (ref 22–32)
Calcium: 9.5 mg/dL (ref 8.9–10.3)
Chloride: 107 mmol/L (ref 98–111)
Creatinine, Ser: 1.81 mg/dL — ABNORMAL HIGH (ref 0.61–1.24)
GFR, Estimated: 44 mL/min — ABNORMAL LOW (ref >60)
Glucose, Bld: 170 mg/dL — ABNORMAL HIGH (ref 70–99)
Potassium: 3.8 mmol/L (ref 3.5–5.1)
Sodium: 140 mmol/L (ref 135–145)

## 2020-08-24 NOTE — Progress Notes (Signed)
PCP - Rosita Fire, MD Cardiologist - Lyman Bishop, MD Neurologist- Phillips Odor, MD Pulmonologist- Marshell Garfinkel, MD  PPM/ICD - denies  Chest x-ray - 06-27-20 EKG - 08-24-20 Stress Test - 04-07-17 ECHO - 03-17-17 Cardiac Cath - denies  Sleep Study - 11-07-16. Positive for OSA CPAP - sometimes  DM: pt does not check CBGs. A1C obtained  Blood Thinner Instructions: Plavix- instructed pt and wife to contact PCP for instructions Aspirin Instructions: Aspirin- per pt he stopped this med months ago. Also instructed pt to obtain instructions from PCP  ERAS Protcol - No  COVID TEST- n/a   Anesthesia review: yes. Pulmonary history; Clearance in EPIC  Patient denies shortness of breath, fever, cough and chest pain at PAT appointment   All instructions explained to the patient, with a verbal understanding of the material. Patient agrees to go over the instructions while at home for a better understanding.The opportunity to ask questions was provided.

## 2020-08-24 NOTE — Progress Notes (Signed)
Requested records from PCP, Dr. Rosita Fire.

## 2020-08-25 NOTE — Progress Notes (Signed)
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  He presented with complaint of numbness in the right arm.  CT showed small 4 mm infarct in the left anterior MCA territory.  He was scheduled to have MRI brain but would not cooperate with having MRI done, refused procedure.  Neurology recommended discharge on Plavix.  Recommended he follow-up outpatient with Dr. Merlene Laughter, however patient reports he did not do this, states he was never given an appointment.  He reports this has been followed by his PCP Dr. Legrand Rams, who continues to prescribe Plavix.    Last seen by his PCP Dr. Rosita Fire 05/01/20 for annual exam.  Medical conditions noted to be stable.  He was referred to general surgery due to enlarging abdominal hernia.  Follows with pulmonologist Dr. Vaughan Browner for history of COPD, asthma, OSA on BiPAP, current smoker (40-pack-year smoking history).  Per notes, PFTs showed obstruction with no bronchodilator response as well as air trapping consistent with emphysema.  Cardiopulmonary test showed exercise induced bronchospasm indicating a component of asthma.  He is maintained on Trelegy, albuterol, duo nebs as needed.  His dyspnea is felt to multifactorial from COPD, severe LVH with diastolic dysfunction, deconditioning, obesity.  He was last seen by Dr. Vaughan Browner 06/14/2020 and upcoming hernia surgery was discussed.  Per note: "Preop assessment for umbilical hernia repair He is at intermediate heightened risk for perioperative complications with COPD, OSA, obesity with ongoing noncompliance and smoking However he does not appear to be in exacerbation today and respiratory status appears well compensated.   Peri-operative Assessment of Pulmonary Risk for Non-Thoracic Surgery: For Mr. Urieta, risk of perioperative pulmonary complications is increased by:                          COPD             Smoking             Obstructive sleep apnea   Respiratory complications generally occur in 1% of ASA  Class I patients, 5% of ASA Class II and 10% of ASA Class III-IV patients These complications rarely result in mortality and iclude postoperative pneumonia, atelectasis, pulmonary embolism, ARDS and increased time requiring postoperative mechanical ventilation.   Overall, I recommend proceeding with the surgery if the risk for respiratory complications are outweighed by the potential benefits. This will need to be discussed between the patient and surgeon.   To reduce risks of respiratory complications, I recommend: --Smoking cessation 6-8 weeks if possible --Pre- and post-operative incentive spirometry performed frequently while awake --Inpatient use of currently prescribed positive-pressure for OSA whenever the patient is sleeping --Avoiding use of pancuronium during anesthesia."  I spoke with Mr. Armijo at his preop appointment because he told preop nurse that he "did not feel right".  He could not articulate a specific complaint, and when asked he would report, "I feel fine, but not fine."  He did not report any chest pain, new shortness of breath, or any strokelike symptoms.  Again, he did not endorse any specific complaint.  He did admit to significant anxiety regarding upcoming procedure.  Says has been thinking about it constantly since it was scheduled.  He does have a history of anxiety and reports that he was unable to have MRI of his brain due to claustrophobia/anxiety.  On exam he was conversant and able to answer questions appropriately, although he was somewhat vague about his history.  He reportedly did not know  why he was taking Plavix and did not have a good understanding of his medication regimen.  Patient's wife supplemented history.  Blood pressure was initially elevated on arrival, but on recheck was 123/91.  Heart rate 74 and regular.  Afebrile.  Breathing unlabored, O2 sats 99% on room air.  On physical exam he was well-appearing, NAD.  Lungs CTAB.  Auscultation of the heart  revealed regular rate and rhythm, soft 1/6 systolic murmur.  Overall, no concerning exam findings.  Preop labs reviewed, creatinine elevated 1.81 consistent with history of renal insufficiency and appears to be near his baseline per available records in epic, labs otherwise unremarkable.  EKG showed NSR with rate of 71, LVH with repol abnormality, anteroseptal infarct, age undetermined.  Appears unchanged from previous tracings.  Discussed that anxiety around his upcoming surgery was likely contributing to his current subjective perception of "not feeling right".  However, also discussed that if he were to develop any chest pain, shortness of breath, presyncope/syncope, or strokelike symptoms seems to be evaluated immediately in emergency department.  Patient verbalized understanding.  Patient reported he was not given any instructions on Plavix.  I called and spoke with Abigail Butts at [facility] surgery and advised that they should reach out to patient's PCP for perioperative instructions.  Cardiopulmonary exercise test 04/07/2017: Conclusion: The interpretation of this test is greatly limited due to submaximal effort during the exercise. Based on available data, exercise testing with gas exchange demonstrates mild functional impairment when compared to matched sedentary norms. There is no clear indication for circulatory limitation, however there was a marked hypertensive response at peak exercise. Pre-exercise spirometry demonstrates mild restrictive properties which are very likely related to his body habitus. At peak exercise and measurements IPE, patient appears mildly limited due to exercise-induced bronchospasm.     TTE 03/17/2017: - Left ventricle: LV systolic function is vigorous with near cavity    obliteration. The LVOT is narrow with turbulent flow. At rest    there is no significant outlfow obstruction. The cavity size was    normal. Wall thickness was increased in a pattern of moderate     LVH. Systolic function was vigorous. The estimated ejection    fraction was in the range of 65% to 70%. Doppler parameters are    consistent with abnormal left ventricular relaxation (grade 1    diastolic dysfunction).   Nuclear stress 03/17/2017: Equivocal ST segment abnormalities in the face of resting changes and increased voltage, overall nonspecific. Significant hypertensive response. Rare PACs. Blood pressure demonstrated a hypertensive response to exercise. No significant myocardial perfusion defects to indicate scar or ischemia. This is an intermediate risk study based on calculated ejection fraction. Suggest echocardiogram to confirm. Nuclear stress EF: 38%.  Coronary CT 01/08/2017: FINDINGS: Non-cardiac: See separate report from [facility].   Calcium Score:  11 Agatston units.   Coronary Arteries: Right dominant with no anomalies   LM:  No plaque or stenosis.   LAD system:  No plaque or stenosis.   Circumflex system: Large ramus without obvious plaque or stenosis. No plaque or stenosis in the AV LCx.   RCA system: Small area of mixed plaque in the ostial PDA with mild stenosis. No other significant disease noted in RCA system.   IMPRESSION: 1. Coronary artery calcium score 11 Agatston units, placing the patient in the 63rd percentile for age and gender. This suggests intermediate risk for future cardiac events.   2.  No hemodynamically significant stenoses in the coronary tree.  Wynonia Musty [facility]/Anesthesiology Phone [phone] 08/25/2020 12:26 PM

## 2020-08-25 NOTE — Anesthesia Preprocedure Evaluation (Deleted)
Anesthesia Evaluation    Airway        Dental   Pulmonary Current Smoker,           Cardiovascular hypertension,      Neuro/Psych    GI/Hepatic   Endo/Other  diabetes  Renal/GU      Musculoskeletal   Abdominal   Peds  Hematology   Anesthesia Other Findings   Reproductive/Obstetrics                             Anesthesia Physical Anesthesia Plan  ASA:   Anesthesia Plan:    Post-op Pain Management:    Induction:   PONV Risk Score and Plan:   Airway Management Planned:   Additional Equipment:   Intra-op Plan:   Post-operative Plan:   Informed Consent:   Plan Discussed with:   Anesthesia Plan Comments: (PAT note by Karoline Caldwell, PA-C: 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  He presented with complaint of numbness in the right arm.  CT showed small 4 mm infarct in the left anterior MCA territory.  He was scheduled to have MRI brain but would not cooperate with having MRI done, refused procedure.  Neurology recommended discharge on Plavix.  Recommended he follow-up outpatient with Dr. Merlene Laughter, however patient reports he did not do this, states he was never given an appointment.  He reports this has been followed by his PCP Dr. Legrand Rams, who continues to prescribe Plavix.    Last seen by his PCP Dr. Rosita Fire 05/01/20 for annual exam.  Medical conditions noted to be stable.  He was referred to general surgery due to enlarging abdominal hernia.  Follows with pulmonologist Dr. Vaughan Browner for history of COPD, asthma, OSA on BiPAP, current smoker (40-pack-year smoking history).  Per notes, PFTs showed obstruction with no bronchodilator response as well as air trapping consistent with emphysema.  Cardiopulmonary test showed exercise induced bronchospasm indicating a component of asthma.  He is maintained on Trelegy, albuterol, duo nebs as needed.  His dyspnea is felt to multifactorial from COPD, severe LVH  with diastolic dysfunction, deconditioning, obesity.  He was last seen by Dr. Vaughan Browner 06/14/2020 and upcoming hernia surgery was discussed.  Per note: "Preop assessmentfor umbilical hernia repair He isat intermediate heightened risk for perioperative complications with COPD, OSA, obesity with ongoing noncompliance and smoking However he does not appear to be in exacerbation today and respiratory status appears well compensated.  Peri-operative Assessment of Pulmonary Risk for Non-Thoracic Surgery: For Craig Knight, risk of perioperative pulmonary complications is increased by:  COPD Smoking Obstructive sleep apnea  Respiratory complications generally occur in 1% of ASA Class I patients, 5% of ASA Class II and 10% of ASA Class III-IV patients These complications rarely result in mortalityand iclude postoperative pneumonia, atelectasis, pulmonary embolism, ARDS and increased time requiring postoperative mechanical ventilation.  Overall, I recommend proceeding with the surgeryif the risk for respiratory complications are outweighed by the potential benefits. This will need to be discussed between the patient and surgeon.  To reduce risksof respiratory complications, I recommend: --Smoking cessation 6-8 weeks if possible --Pre- and post-operative incentive spirometry performed frequently while awake --Inpatient use of currently prescribed positive-pressure for OSA whenever the patient is sleeping --Avoiding use of pancuronium during anesthesia."  I spoke with Craig Knight at his preop appointment because he told preop nurse that he "did not feel right".  He could not articulate a specific complaint, and when asked he would report, "I feel  fine, but not fine."  He did not report any chest pain, new shortness of breath, or any strokelike symptoms.  Again, he did not endorse any specific complaint.  He did admit to significant anxiety regarding  upcoming procedure.  Says has been thinking about it constantly since it was scheduled.  He does have a history of anxiety and reports that he was unable to have MRI of his brain due to claustrophobia/anxiety.  On exam he was conversant and able to answer questions appropriately, although he was somewhat vague about his history.  He reportedly did not know why he was taking Plavix and did not have a good understanding of his medication regimen.  Patient's wife supplemented history.  Blood pressure was initially elevated on arrival, but on recheck was 123/91.  Heart rate 74 and regular.  Afebrile.  Breathing unlabored, O2 sats 99% on room air.  On physical exam he was well-appearing, NAD.  Lungs CTAB.  Auscultation of the heart revealed regular rate and rhythm, soft 1/6 systolic murmur.  Overall, no concerning exam findings.  Preop labs reviewed, creatinine elevated 1.81 consistent with history of renal insufficiency and appears to be near his baseline per available records in epic, labs otherwise unremarkable.  EKG showed NSR with rate of 71, LVH with repol abnormality, anteroseptal infarct, age undetermined.  Appears unchanged from previous tracings.  Discussed that anxiety around his upcoming surgery was likely contributing to his current subjective perception of "not feeling right".  However, also discussed that if he were to develop any chest pain, shortness of breath, presyncope/syncope, or strokelike symptoms seems to be evaluated immediately in emergency department.  Patient verbalized understanding.  Patient reported he was not given any instructions on Plavix.  I called and spoke with Abigail Butts at [facility] surgery and advised that they should reach out to patient's PCP for perioperative instructions.  Cardiopulmonary exercise test 04/07/2017: Conclusion: The interpretation of this test is greatly limited due to submaximal effort during the exercise. Based on available data, exercise testing with gas  exchange demonstrates mild functional impairment when compared to matched sedentary norms. There is no clear indication for circulatory limitation, however there was a marked hypertensive response at peak exercise. Pre-exercise spirometry demonstrates mild restrictive properties which are very likely related to his body habitus. At peak exercise and measurements IPE, patient appears mildly limited due to exercise-induced bronchospasm.    TTE 03/17/2017: - Left ventricle: LV systolic function is vigorous with near cavity  obliteration. The LVOT is narrow with turbulent flow. At rest  there is no significant outlfow obstruction. The cavity size was  normal. Wall thickness was increased in a pattern of moderate  LVH. Systolic function was vigorous. The estimated ejection  fraction was in the range of 65% to 70%. Doppler parameters are  consistent with abnormal left ventricular relaxation (grade 1  diastolic dysfunction).   Nuclear stress 03/17/2017: . Equivocal ST segment abnormalities in the face of resting changes and increased voltage, overall nonspecific. Significant hypertensive response. Rare PACs. . Blood pressure demonstrated a hypertensive response to exercise. . No significant myocardial perfusion defects to indicate scar or ischemia. . This is an intermediate risk study based on calculated ejection fraction. Suggest echocardiogram to confirm. . Nuclear stress EF: 38%.  Coronary CT 01/08/2017: FINDINGS: Non-cardiac: See separate report from [facility].  Calcium Score: 11 Agatston units.  Coronary Arteries: Right dominant with no anomalies  LM: No plaque or stenosis.  LAD system: No plaque or stenosis.  Circumflex system:  Large ramus without obvious plaque or stenosis. No plaque or stenosis in the AV LCx.  RCA system: Small area of mixed plaque in the ostial PDA with mild stenosis. No other significant disease noted in RCA  system.  IMPRESSION: 1. Coronary artery calcium score 11 Agatston units, placing the patient in the 63rd percentile for age and gender. This suggests intermediate risk for future cardiac events.  2. No hemodynamically significant stenoses in the coronary tree.   )        Anesthesia Quick Evaluation

## 2020-09-01 ENCOUNTER — Other Ambulatory Visit (HOSPITAL_COMMUNITY)
Admission: RE | Admit: 2020-09-01 | Discharge: 2020-09-01 | Disposition: A | Discharge status: Home / Self Care | Payer: Medicare Other | Source: Ambulatory Visit | Attending: Surgery | Admitting: Surgery

## 2020-09-01 DIAGNOSIS — Z20822 Contact with and (suspected) exposure to covid-19: Secondary | ICD-10-CM | POA: Diagnosis not present

## 2020-09-01 DIAGNOSIS — Z01812 Encounter for preprocedural laboratory examination: Secondary | ICD-10-CM | POA: Diagnosis present

## 2020-09-01 LAB — SARS CORONAVIRUS 2 (TAT 6-24 HRS): SARS Coronavirus 2: NEGATIVE

## 2020-09-05 ENCOUNTER — Ambulatory Visit (HOSPITAL_COMMUNITY)
Admission: RE | Admit: 2020-09-05 | Discharge: 2020-09-05 | Disposition: A | Discharge status: Home / Self Care | Payer: Medicare Other | Source: Home / Self Care | Attending: Surgery | Admitting: Surgery

## 2020-09-05 ENCOUNTER — Encounter (HOSPITAL_COMMUNITY)
Admission: RE | Disposition: A | Discharge status: Home / Self Care | Payer: Self-pay | Source: Home / Self Care | Attending: Surgery

## 2020-09-05 ENCOUNTER — Other Ambulatory Visit: Payer: Self-pay

## 2020-09-05 DIAGNOSIS — I6319 Cerebral infarction due to embolism of other precerebral artery: Secondary | ICD-10-CM | POA: Diagnosis not present

## 2020-09-05 DIAGNOSIS — Z538 Procedure and treatment not carried out for other reasons: Secondary | ICD-10-CM | POA: Insufficient documentation

## 2020-09-05 DIAGNOSIS — I639 Cerebral infarction, unspecified: Secondary | ICD-10-CM | POA: Diagnosis not present

## 2020-09-05 DIAGNOSIS — Z01818 Encounter for other preprocedural examination: Secondary | ICD-10-CM | POA: Insufficient documentation

## 2020-09-05 LAB — GLUCOSE, CAPILLARY: Glucose-Capillary: 143 mg/dL — ABNORMAL HIGH (ref 70–99)

## 2020-09-05 SURGERY — REPAIR, HERNIA, UMBILICAL, ADULT
Anesthesia: General

## 2020-09-05 MED ORDER — FENTANYL CITRATE (PF) 250 MCG/5ML IJ SOLN
INTRAMUSCULAR | Status: AC
  Filled 2020-09-05: qty 5

## 2020-09-05 MED ORDER — CEFAZOLIN SODIUM-DEXTROSE 2-4 GM/100ML-% IV SOLN: 2.0000 g | INTRAVENOUS | Status: DC

## 2020-09-05 MED ORDER — CHLORHEXIDINE GLUCONATE 0.12 % MT SOLN
OROMUCOSAL | Status: AC
  Administered 2020-09-05: 15 mL via OROMUCOSAL
  Filled 2020-09-05: qty 15

## 2020-09-05 MED ORDER — CHLORHEXIDINE GLUCONATE CLOTH 2 % EX PADS: 6.0000 | MEDICATED_PAD | Freq: Once | CUTANEOUS | Status: DC

## 2020-09-05 MED ORDER — BUPIVACAINE LIPOSOME 1.3 % IJ SUSP
20.0000 mL | Freq: Once | INTRAMUSCULAR | Status: DC
  Filled 2020-09-05: qty 20

## 2020-09-05 MED ORDER — ORAL CARE MOUTH RINSE: 15.0000 mL | Freq: Once | OROMUCOSAL | Status: AC

## 2020-09-05 MED ORDER — CEFAZOLIN SODIUM-DEXTROSE 2-4 GM/100ML-% IV SOLN
INTRAVENOUS | Status: AC
  Filled 2020-09-05: qty 100

## 2020-09-05 MED ORDER — LACTATED RINGERS IV SOLN: INTRAVENOUS | Status: DC

## 2020-09-05 MED ORDER — CHLORHEXIDINE GLUCONATE 0.12 % MT SOLN: 15.0000 mL | Freq: Once | OROMUCOSAL | Status: AC

## 2020-09-05 NOTE — Progress Notes (Signed)
Pt's surgery cancelled due to failing to stop smocking 24 h prior to surgery. IV access removed.

## 2020-09-06 ENCOUNTER — Other Ambulatory Visit: Payer: Self-pay

## 2020-09-06 ENCOUNTER — Emergency Department (HOSPITAL_COMMUNITY): Payer: Medicare Other

## 2020-09-06 ENCOUNTER — Encounter (HOSPITAL_COMMUNITY): Payer: Self-pay | Admitting: *Deleted

## 2020-09-06 ENCOUNTER — Inpatient Hospital Stay (HOSPITAL_COMMUNITY)
Admission: EM | Admit: 2020-09-06 | Discharge: 2020-09-08 | DRG: 064 | Disposition: A | Discharge status: Home / Self Care | Payer: Medicare Other | Attending: Student | Admitting: Student

## 2020-09-06 DIAGNOSIS — I10 Essential (primary) hypertension: Secondary | ICD-10-CM | POA: Diagnosis present

## 2020-09-06 DIAGNOSIS — F17210 Nicotine dependence, cigarettes, uncomplicated: Secondary | ICD-10-CM | POA: Diagnosis present

## 2020-09-06 DIAGNOSIS — I6340 Cerebral infarction due to embolism of unspecified cerebral artery: Secondary | ICD-10-CM

## 2020-09-06 DIAGNOSIS — I639 Cerebral infarction, unspecified: Secondary | ICD-10-CM | POA: Diagnosis present

## 2020-09-06 DIAGNOSIS — I6319 Cerebral infarction due to embolism of other precerebral artery: Principal | ICD-10-CM | POA: Diagnosis present

## 2020-09-06 DIAGNOSIS — G8191 Hemiplegia, unspecified affecting right dominant side: Secondary | ICD-10-CM | POA: Diagnosis present

## 2020-09-06 DIAGNOSIS — R29810 Facial weakness: Secondary | ICD-10-CM | POA: Diagnosis present

## 2020-09-06 DIAGNOSIS — N1831 Chronic kidney disease, stage 3a: Secondary | ICD-10-CM | POA: Diagnosis present

## 2020-09-06 DIAGNOSIS — I5032 Chronic diastolic (congestive) heart failure: Secondary | ICD-10-CM | POA: Diagnosis present

## 2020-09-06 DIAGNOSIS — R471 Dysarthria and anarthria: Secondary | ICD-10-CM | POA: Diagnosis present

## 2020-09-06 DIAGNOSIS — E1122 Type 2 diabetes mellitus with diabetic chronic kidney disease: Secondary | ICD-10-CM | POA: Diagnosis present

## 2020-09-06 DIAGNOSIS — R4781 Slurred speech: Secondary | ICD-10-CM | POA: Diagnosis present

## 2020-09-06 DIAGNOSIS — Z20822 Contact with and (suspected) exposure to covid-19: Secondary | ICD-10-CM | POA: Diagnosis present

## 2020-09-06 DIAGNOSIS — E119 Type 2 diabetes mellitus without complications: Secondary | ICD-10-CM

## 2020-09-06 DIAGNOSIS — E669 Obesity, unspecified: Secondary | ICD-10-CM | POA: Diagnosis present

## 2020-09-06 DIAGNOSIS — E1165 Type 2 diabetes mellitus with hyperglycemia: Secondary | ICD-10-CM

## 2020-09-06 DIAGNOSIS — Z800 Family history of malignant neoplasm of digestive organs: Secondary | ICD-10-CM | POA: Diagnosis not present

## 2020-09-06 DIAGNOSIS — J449 Chronic obstructive pulmonary disease, unspecified: Secondary | ICD-10-CM | POA: Diagnosis present

## 2020-09-06 DIAGNOSIS — Z6836 Body mass index (BMI) 36.0-36.9, adult: Secondary | ICD-10-CM

## 2020-09-06 DIAGNOSIS — N179 Acute kidney failure, unspecified: Secondary | ICD-10-CM | POA: Diagnosis present

## 2020-09-06 DIAGNOSIS — H53462 Homonymous bilateral field defects, left side: Secondary | ICD-10-CM | POA: Diagnosis present

## 2020-09-06 DIAGNOSIS — I6389 Other cerebral infarction: Secondary | ICD-10-CM | POA: Diagnosis not present

## 2020-09-06 DIAGNOSIS — I130 Hypertensive heart and chronic kidney disease with heart failure and stage 1 through stage 4 chronic kidney disease, or unspecified chronic kidney disease: Secondary | ICD-10-CM | POA: Diagnosis present

## 2020-09-06 DIAGNOSIS — Z538 Procedure and treatment not carried out for other reasons: Secondary | ICD-10-CM | POA: Diagnosis present

## 2020-09-06 DIAGNOSIS — G4730 Sleep apnea, unspecified: Secondary | ICD-10-CM | POA: Diagnosis present

## 2020-09-06 DIAGNOSIS — Z8249 Family history of ischemic heart disease and other diseases of the circulatory system: Secondary | ICD-10-CM

## 2020-09-06 DIAGNOSIS — R29711 NIHSS score 11: Secondary | ICD-10-CM | POA: Diagnosis present

## 2020-09-06 DIAGNOSIS — Z7984 Long term (current) use of oral hypoglycemic drugs: Secondary | ICD-10-CM | POA: Diagnosis not present

## 2020-09-06 DIAGNOSIS — I63431 Cerebral infarction due to embolism of right posterior cerebral artery: Secondary | ICD-10-CM | POA: Diagnosis present

## 2020-09-06 DIAGNOSIS — Z823 Family history of stroke: Secondary | ICD-10-CM

## 2020-09-06 DIAGNOSIS — D72829 Elevated white blood cell count, unspecified: Secondary | ICD-10-CM | POA: Diagnosis present

## 2020-09-06 DIAGNOSIS — Z01818 Encounter for other preprocedural examination: Secondary | ICD-10-CM

## 2020-09-06 DIAGNOSIS — N183 Chronic kidney disease, stage 3 unspecified: Secondary | ICD-10-CM | POA: Diagnosis present

## 2020-09-06 LAB — COMPREHENSIVE METABOLIC PANEL
ALT: 20 U/L (ref 0–44)
AST: 16 U/L (ref 15–41)
Albumin: 4.3 g/dL (ref 3.5–5.0)
Alkaline Phosphatase: 97 U/L (ref 38–126)
Anion gap: 10 (ref 5–15)
BUN: 38 mg/dL — ABNORMAL HIGH (ref 6–20)
CO2: 26 mmol/L (ref 22–32)
Calcium: 9.6 mg/dL (ref 8.9–10.3)
Chloride: 101 mmol/L (ref 98–111)
Creatinine, Ser: 2.41 mg/dL — ABNORMAL HIGH (ref 0.61–1.24)
GFR, Estimated: 31 mL/min — ABNORMAL LOW (ref >60)
Glucose, Bld: 148 mg/dL — ABNORMAL HIGH (ref 70–99)
Potassium: 4 mmol/L (ref 3.5–5.1)
Sodium: 137 mmol/L (ref 135–145)
Total Bilirubin: 0.3 mg/dL (ref 0.3–1.2)
Total Protein: 7.2 g/dL (ref 6.5–8.1)

## 2020-09-06 LAB — RESP PANEL BY RT-PCR (FLU A&B, COVID) ARPGX2
Influenza A by PCR: NEGATIVE
Influenza B by PCR: NEGATIVE
SARS Coronavirus 2 by RT PCR: NEGATIVE

## 2020-09-06 LAB — DIFFERENTIAL
Abs Immature Granulocytes: 0.04 10*3/uL (ref 0.00–0.07)
Basophils Absolute: 0.1 10*3/uL (ref 0.0–0.1)
Basophils Relative: 1 %
Eosinophils Absolute: 0.2 10*3/uL (ref 0.0–0.5)
Eosinophils Relative: 2 %
Immature Granulocytes: 0 %
Lymphocytes Relative: 18 %
Lymphs Abs: 2.4 10*3/uL (ref 0.7–4.0)
Monocytes Absolute: 1.2 10*3/uL — ABNORMAL HIGH (ref 0.1–1.0)
Monocytes Relative: 9 %
Neutro Abs: 9.2 10*3/uL — ABNORMAL HIGH (ref 1.7–7.7)
Neutrophils Relative %: 70 %

## 2020-09-06 LAB — I-STAT CHEM 8, ED
BUN: 36 mg/dL — ABNORMAL HIGH (ref 6–20)
Calcium, Ion: 1.18 mmol/L (ref 1.15–1.40)
Chloride: 103 mmol/L (ref 98–111)
Creatinine, Ser: 2.4 mg/dL — ABNORMAL HIGH (ref 0.61–1.24)
Glucose, Bld: 149 mg/dL — ABNORMAL HIGH (ref 70–99)
HCT: 47 % (ref 39.0–52.0)
Hemoglobin: 16 g/dL (ref 13.0–17.0)
Potassium: 4.1 mmol/L (ref 3.5–5.1)
Sodium: 139 mmol/L (ref 135–145)
TCO2: 26 mmol/L (ref 22–32)

## 2020-09-06 LAB — CBC
HCT: 45.9 % (ref 39.0–52.0)
Hemoglobin: 15.3 g/dL (ref 13.0–17.0)
MCH: 27 pg (ref 26.0–34.0)
MCHC: 33.3 g/dL (ref 30.0–36.0)
MCV: 81 fL (ref 80.0–100.0)
Platelets: 271 10*3/uL (ref 150–400)
RBC: 5.67 MIL/uL (ref 4.22–5.81)
RDW: 14 % (ref 11.5–15.5)
WBC: 13 10*3/uL — ABNORMAL HIGH (ref 4.0–10.5)
nRBC: 0 % (ref 0.0–0.2)

## 2020-09-06 LAB — PROTIME-INR
INR: 0.9 (ref 0.8–1.2)
Prothrombin Time: 12.5 seconds (ref 11.4–15.2)

## 2020-09-06 LAB — CBG MONITORING, ED: Glucose-Capillary: 155 mg/dL — ABNORMAL HIGH (ref 70–99)

## 2020-09-06 LAB — APTT: aPTT: 26 seconds (ref 24–36)

## 2020-09-06 LAB — ETHANOL: Alcohol, Ethyl (B): <10 mg/dL (ref <10)

## 2020-09-06 IMAGING — CT CT HEAD CODE STROKE
3 of 4 series · 14 of 47 positions shown, 16 images · non-contrast
Comparison: CT head [DATE].

CLINICAL DATA: Code stroke.  Neuro deficit, acute stroke suspected.

EXAM:
CT HEAD WITHOUT CONTRAST
TECHNIQUE: Contiguous axial images were obtained from the base of the skull
through the vertex without intravenous contrast.

[Series 3: head w o · axial · 0.47mm/px · z∈[+44,+184]mm · 8 of 34 slices shown, 10 images]
[im 3/34  brain]
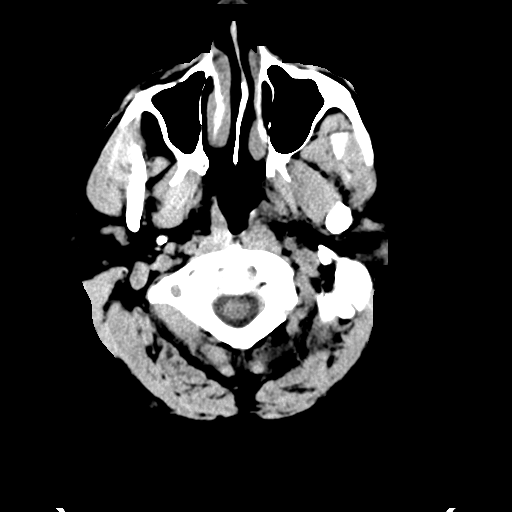
[im 3/34  bone]
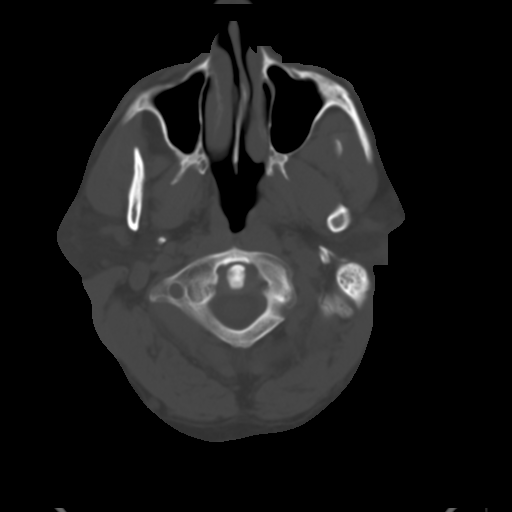
[im 8/34  brain]
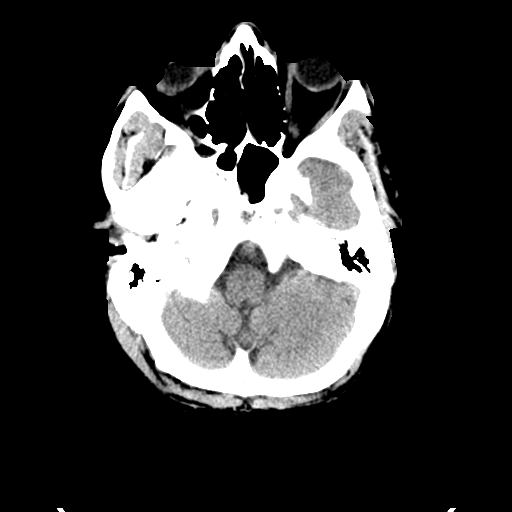
[im 12/34  brain]
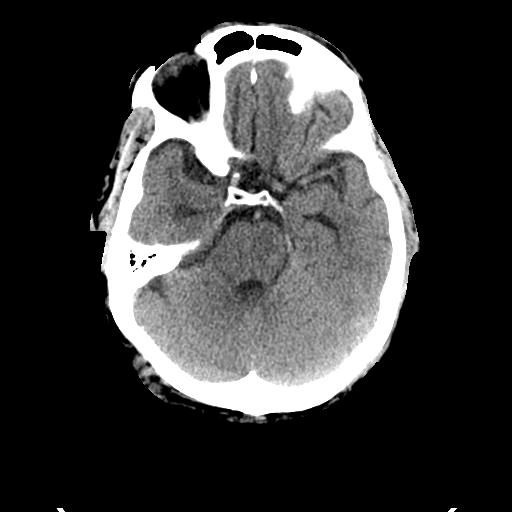
[im 15/34  brain]
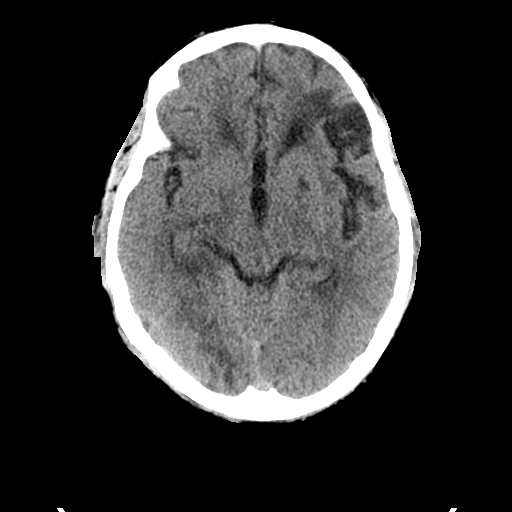
[im 19/34  brain]
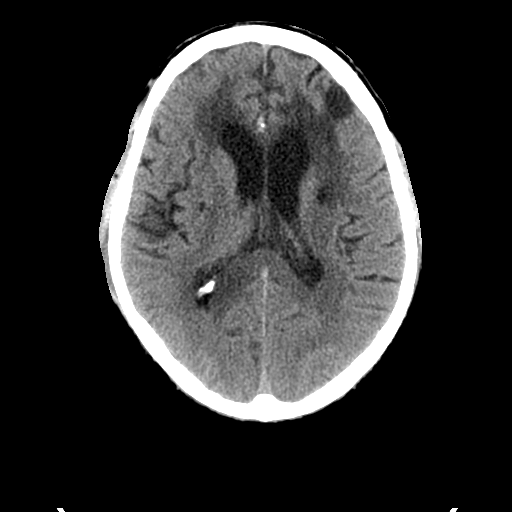
[im 19/34  bone]
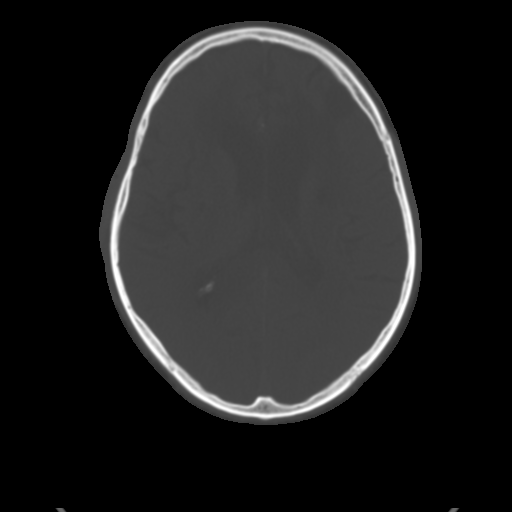
[im 22/34  brain]
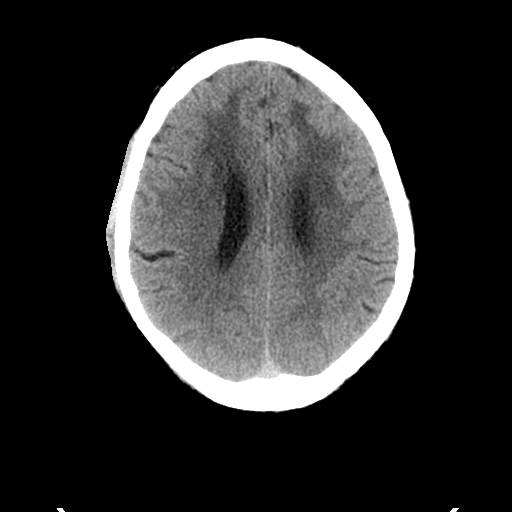
[im 26/34  brain]
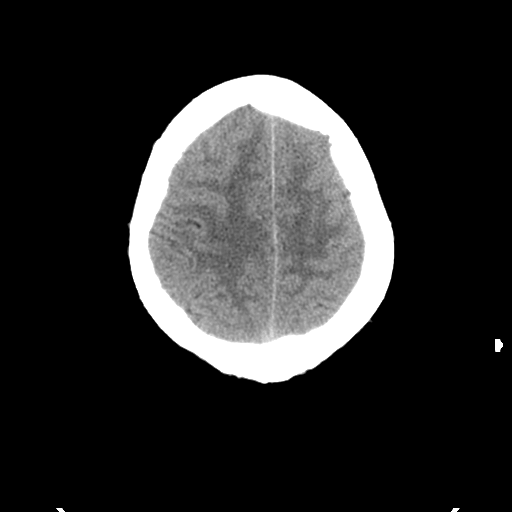
[im 31/34  brain]
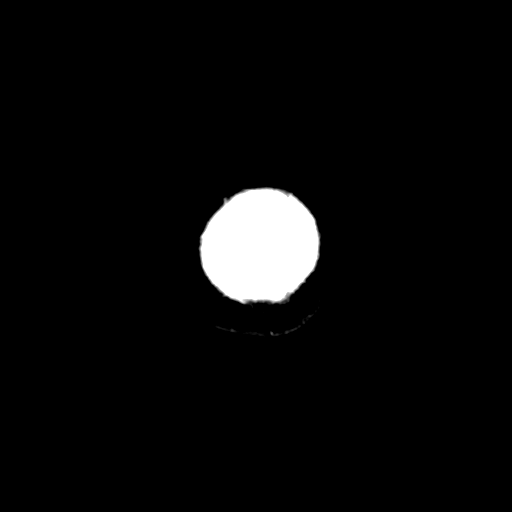

[Series 5: coronal soft · coronal · 0.34mm/px · 3 of 71 slices shown]
[im 24/71  brain]
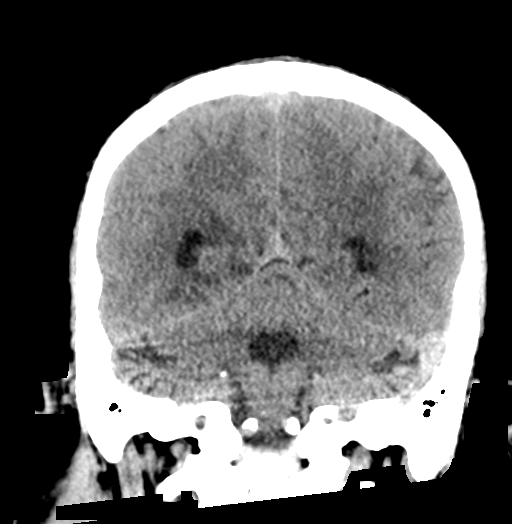
[im 32/71  brain]
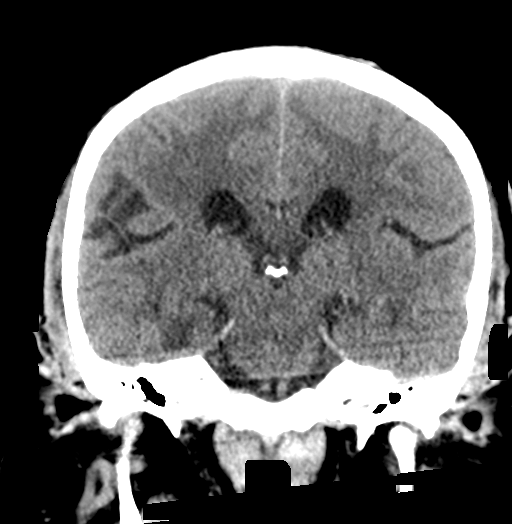
[im 39/71  brain]
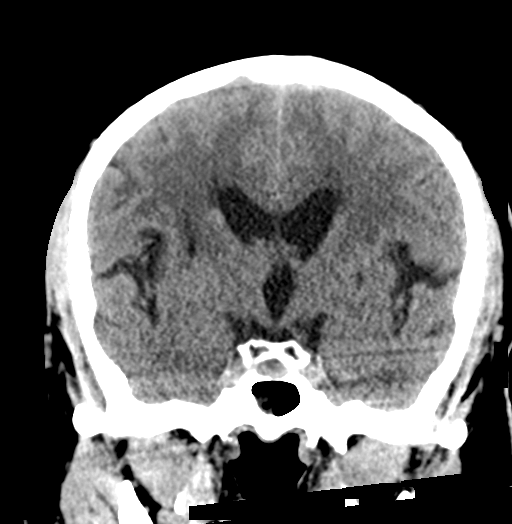

[Series 6: sagittal soft · sagittal · 0.37mm/px · 3 of 67 slices shown]
[im 23/67  brain]
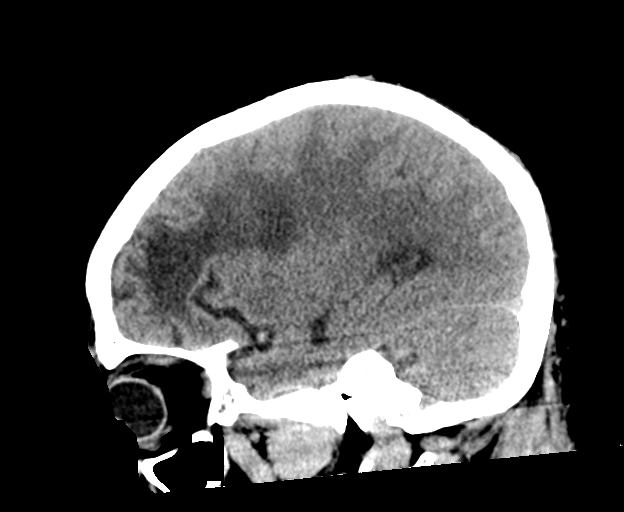
[im 34/67  brain]
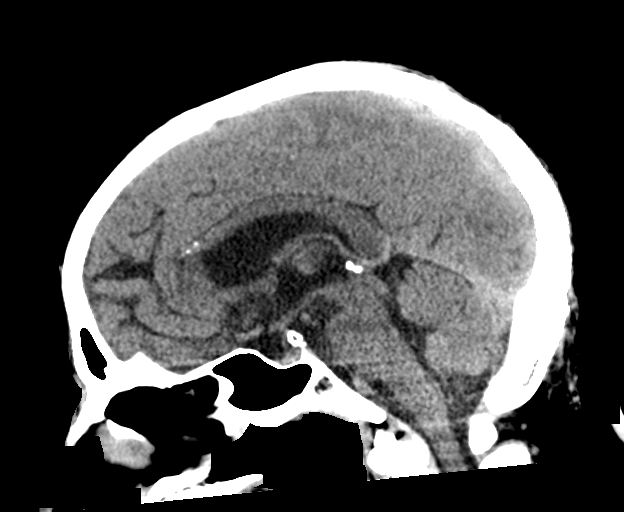
[im 45/67  brain]
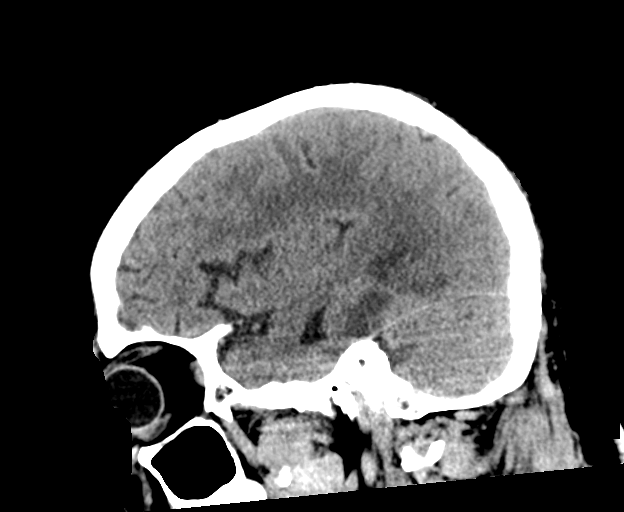

[14 of 47 positions shown; findings below may reference images not displayed]

FINDINGS: Brain: Acute right PCA territory infarcts, including the right
occipital lobe, right mesial temporal lobe and potentially the right
thalamus and right aspect of the splenium of the corpus callosum.
Associated edema and local mass effect. No midline shift. Mild
hyperdensity in the right occipital lobe. Remote infarcts in the
left frontal lobe with encephalomalacia. Remote infarcts in
bilateral corona radiata and basal ganglia. Additional moderate
patchy white matter hypoattenuation, most likely chronic
microvascular ischemic disease. Similar atrophy. Similar mild
ventriculomegaly, mildly disproportionate to the degree of sulcal
enlargement. Additionally there is similar crowding of sulci at the
vertex and a mildly acute callosal angle.

Vascular: Subtle hyperdensity within the right PCA (series 5, image
36; series 6, image 30), potentially thrombus. Intracranial
atherosclerosis.

Skull: No acute fracture.

Sinuses/Orbits: Sinuses are largely clear.  Unremarkable orbits.

Other: No mastoid effusions.
IMPRESSION: 1. Acute right PCA territory infarcts, including the right occipital
lobe, right mesial/parahippocampal temporal lobe and potentially the
right thalamus and right aspect of the splenium of the corpus
callosum. Associated edema and local mass effect without midline
shift.
2. Mild hyperdensity in the right occipital lobe may represent
spared cortex and/or petechial hemorrhage and warrants attention on
follow-up.
3. Subtle hyperdensity within the right PCA (series 5, image 36;
series 6, image 30), concerning for thrombus given the above
findings. Recommend CTA.
4. Remote infarcts in the left frontal lobe and remote lacunar
infarcts in bilateral corona radiata and basal ganglia.
5. Moderate chronic microvascular ischemic disease.
6. Similar mild ventriculomegaly, mildly disproportionate to the
degree of sulcal enlargement. Findings could be secondary to central
predominant volume loss versus normal pressure hydrocephalus.

Code stroke imaging results were communicated on [DATE] at [DATE] to provider Dr. STIVEL via telephone, who verbally acknowledged
these results.

## 2020-09-06 IMAGING — CT CT ANGIO HEAD
3 of 7 series · 10 of 35 positions shown · IV contrast (omnipaque)
Comparison: CT angiogram of the head and neck [DATE].

CLINICAL DATA: Right-sided weakness, left-sided vision impairment.

EXAM:
CT ANGIOGRAPHY HEAD AND NECK
TECHNIQUE: Multidetector CT imaging of the head and neck was performed using
the standard protocol during bolus administration of intravenous
contrast. Multiplanar CT image reconstructions and MIPs were
obtained to evaluate the vascular anatomy. Carotid stenosis
measurements (when applicable) are obtained utilizing NASCET
criteria, using the distal internal carotid diameter as the
denominator.
CONTRAST:  60mL OMNIPAQUE IOHEXOL 350 MG/ML SOLN

[Series 4: cta head & neck · axial · 0.49mm/px · z∈[-69,+39]mm · 2 of 163 slices shown]
[im 55/163  soft-tissue]
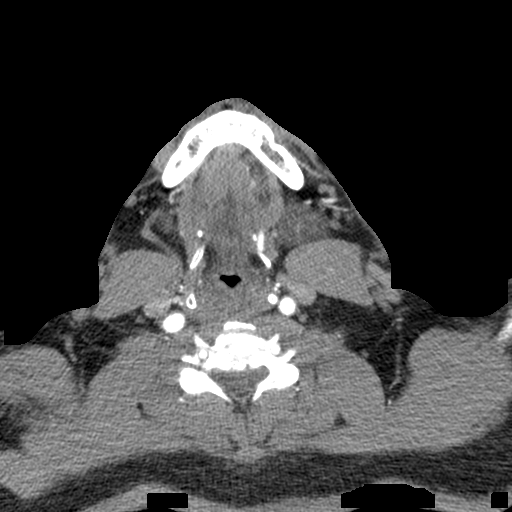
[im 109/163  soft-tissue]
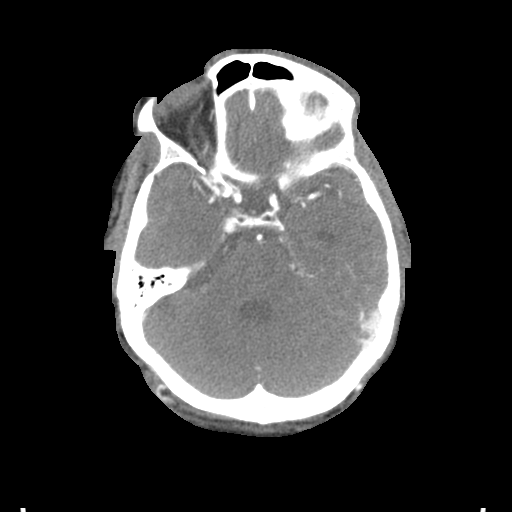

[Series 6: ax thins · axial · 0.39mm/px · z∈[-131,+102]mm · 6 of 327 slices shown]
[im 47/327  soft-tissue]
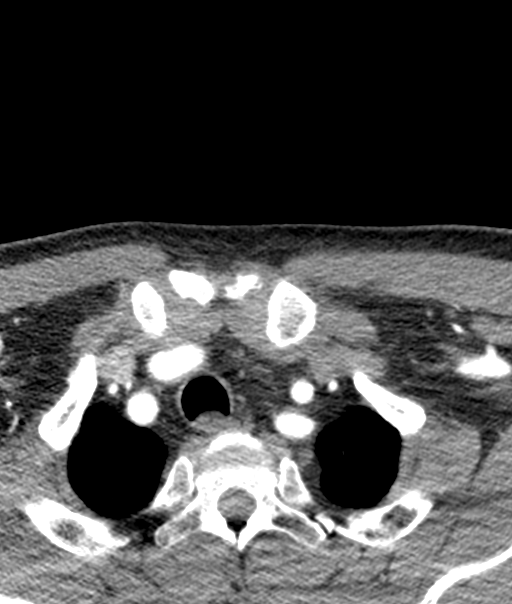
[im 94/327  bone]
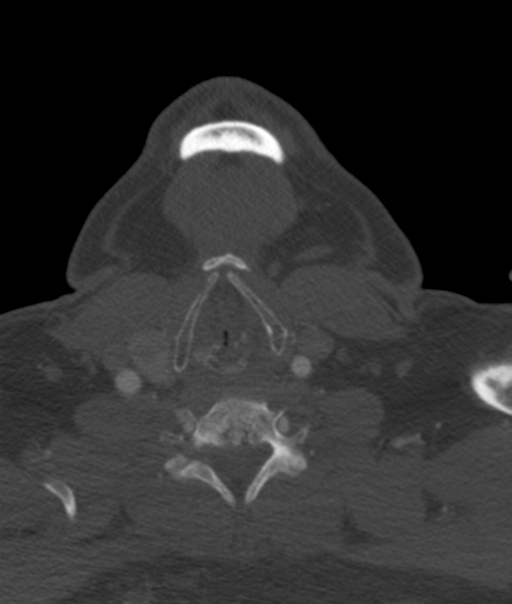
[im 140/327  soft-tissue]
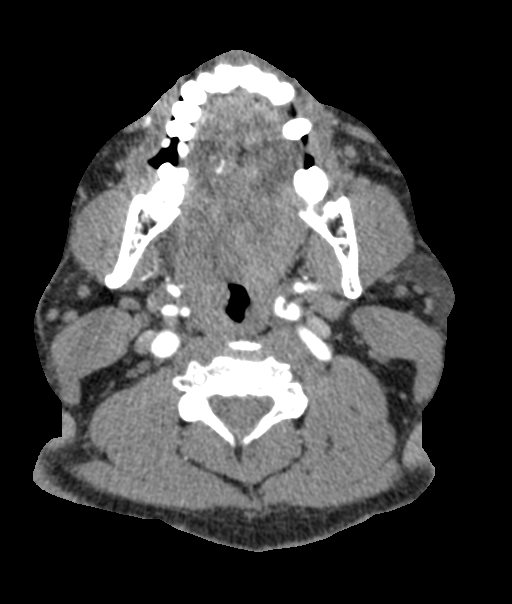
[im 187/327  bone]
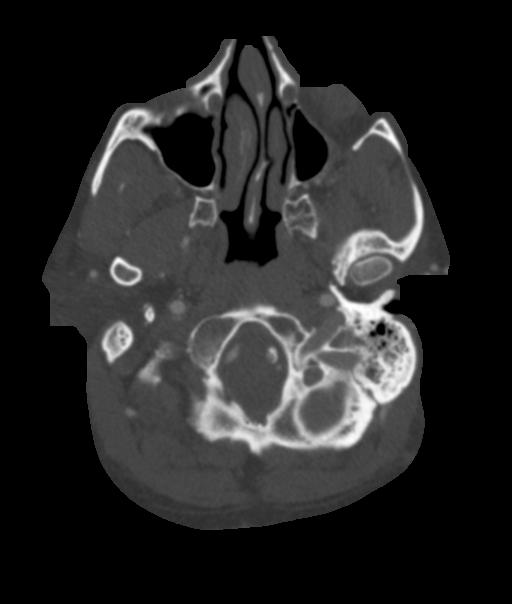
[im 233/327  soft-tissue]
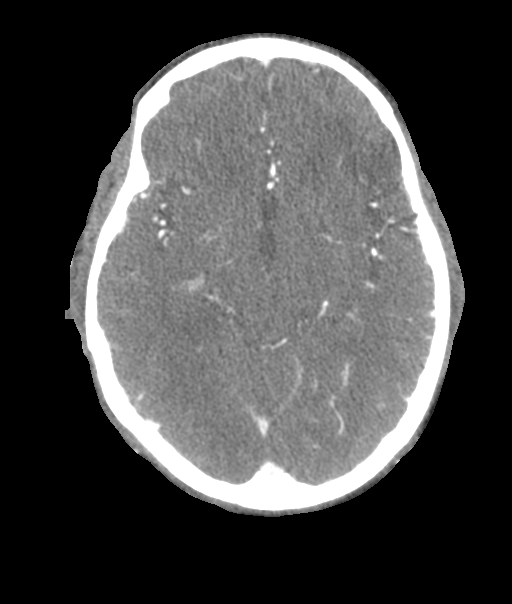
[im 280/327  bone]
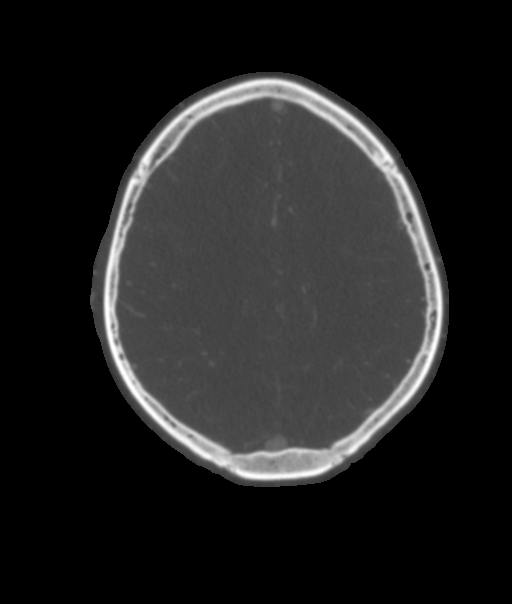

[Series 8: sag thin · sagittal · 0.50mm/px · 2 of 201 slices shown]
[im 50/201  soft-tissue]
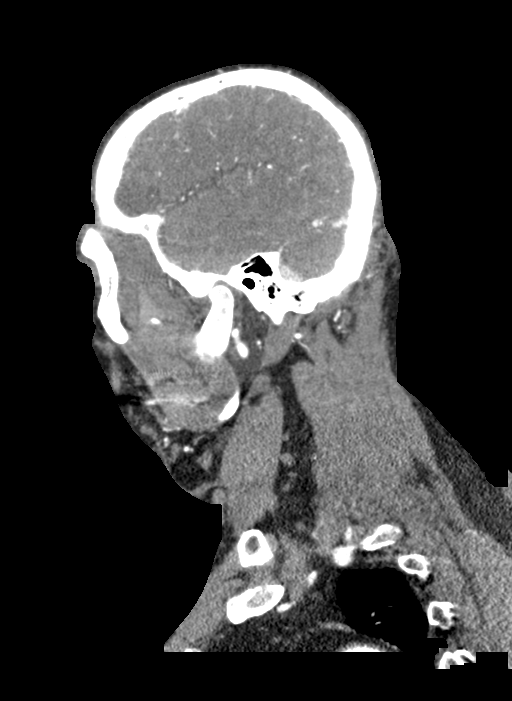
[im 141/201  soft-tissue]
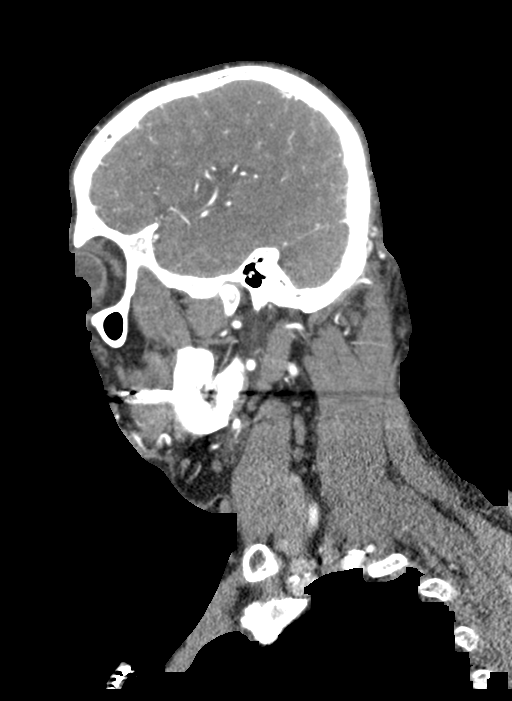

[10 of 35 positions shown; findings below may reference images not displayed]

FINDINGS: CTA NECK FINDINGS

Aortic arch: Common origin of the innominate and left common carotid
artery from the aortic arch. Imaged portion shows no evidence of
aneurysm or dissection. No significant stenosis of the major arch
vessel origins.

Right carotid system: Increased tortuosity of the cervical segment
of the right ICA. No significant stenosis.

Left carotid system: Mild atherosclerotic changes of the left
carotid bifurcation without hemodynamically significant stenosis.
Increased tortuosity of the cervical segment of the left ICA.

Vertebral arteries: Codominant. No evidence of dissection, stenosis
(50% or greater) or occlusion.

Skeleton: No acute osseous abnormality. Periapical lucency at the
right maxillary first molar tooth.

Other neck: Enlarged thyroid gland with multiple hypodense nodules,
the largest measuring up to 1.5 cm.

Upper chest: Negative.

Review of the MIP images confirms the above findings

CTA HEAD FINDINGS

Anterior circulation: Mild atherosclerotic changes of the bilateral
carotid siphon without stenosis. Luminal irregularity seen along the
bilateral MCA vascular trees with focus of moderate stenosis at a
left M3/MCA superior division branch and mild stenosis at a right
M3/MCA inferior division branch. There is also luminal irregularity
in the bilateral ACA vascular trees with focal area of moderate
stenosis at the right A3/ACA segment. Fenestration of the left
A1/ACA again noted.

Posterior circulation: Atherosclerotic changes of the bilateral
intracranial vertebral arteries with moderate stenosis on the left.
No significant stenosis on the right. The basilar artery has normal
course and caliber. There is an occlusion of the right PCA at the
P1-P2 junction with poor distal flow. Atherosclerotic changes of the
left posterior cerebral artery with mild-to-moderate stenosis at the
P2 segment.

Venous sinuses: As permitted by contrast timing, patent.

Anatomic variants: Fenestration of the left A1/ACA.

Review of the MIP images confirms the above findings
IMPRESSION: 1. Positive for large vessel occlusion at the right P1-P2/PCA
junction.
2. Intracranial atherosclerotic disease with moderate stenosis at a
left M3/MCA superior division branch, mild stenosis at a right
M3/MCA inferior division branch, moderate stenosis at the right
A3/ACA segment, moderate stenosis at the left vertebral artery and
mild-to-moderate stenosis at a left P2/PCA.
3. No significant atherosclerotic disease in the neck.

These results were called by telephone at the time of interpretation
on [DATE] at [DATE] to Dr. PANIIKZ, who verbally acknowledged these
results.

## 2020-09-06 MED ORDER — ACETAMINOPHEN 160 MG/5ML PO SOLN: 650.0000 mg | ORAL | Status: DC | PRN

## 2020-09-06 MED ORDER — IOHEXOL 350 MG/ML SOLN
60.0000 mL | Freq: Once | INTRAVENOUS | Status: AC | PRN
  Administered 2020-09-06: 60 mL via INTRAVENOUS

## 2020-09-06 MED ORDER — ATORVASTATIN CALCIUM 40 MG PO TABS
40.0000 mg | ORAL_TABLET | Freq: Every day | ORAL | Status: DC
  Administered 2020-09-07: 40 mg via ORAL
  Filled 2020-09-06: qty 1

## 2020-09-06 MED ORDER — STROKE: EARLY STAGES OF RECOVERY BOOK
Freq: Once | Status: DC
  Filled 2020-09-06: qty 1

## 2020-09-06 MED ORDER — ALTEPLASE 100 MG IV SOLR
INTRAVENOUS | Status: AC
  Filled 2020-09-06: qty 100

## 2020-09-06 MED ORDER — ACETAMINOPHEN 325 MG PO TABS: 650.0000 mg | ORAL_TABLET | ORAL | Status: DC | PRN

## 2020-09-06 MED ORDER — LORAZEPAM 2 MG/ML IJ SOLN: 0.5000 mg | Freq: Once | INTRAMUSCULAR | Status: DC

## 2020-09-06 MED ORDER — INSULIN ASPART 100 UNIT/ML IJ SOLN
0.0000 [IU] | Freq: Every day | INTRAMUSCULAR | Status: DC
  Filled 2020-09-06: qty 1

## 2020-09-06 MED ORDER — IPRATROPIUM-ALBUTEROL 0.5-2.5 (3) MG/3ML IN SOLN: 3.0000 mL | RESPIRATORY_TRACT | Status: DC | PRN

## 2020-09-06 MED ORDER — GABAPENTIN 300 MG PO CAPS
300.0000 mg | ORAL_CAPSULE | Freq: Every day | ORAL | Status: DC
  Administered 2020-09-07 (×2): 300 mg via ORAL
  Filled 2020-09-06 (×2): qty 1

## 2020-09-06 MED ORDER — HYDRALAZINE HCL 20 MG/ML IJ SOLN
10.0000 mg | INTRAMUSCULAR | Status: DC | PRN
  Administered 2020-09-07: 10 mg via INTRAVENOUS
  Filled 2020-09-06: qty 1

## 2020-09-06 MED ORDER — FLUTICASONE-UMECLIDIN-VILANT 100-62.5-25 MCG/INH IN AEPB: 1.0000 | INHALATION_SPRAY | Freq: Every day | RESPIRATORY_TRACT | Status: DC

## 2020-09-06 MED ORDER — UMECLIDINIUM BROMIDE 62.5 MCG/INH IN AEPB
1.0000 | INHALATION_SPRAY | Freq: Every day | RESPIRATORY_TRACT | Status: DC
  Administered 2020-09-08: 1 via RESPIRATORY_TRACT
  Filled 2020-09-06: qty 7

## 2020-09-06 MED ORDER — FLUTICASONE FUROATE-VILANTEROL 100-25 MCG/INH IN AEPB
1.0000 | INHALATION_SPRAY | Freq: Every day | RESPIRATORY_TRACT | Status: DC
  Administered 2020-09-08: 1 via RESPIRATORY_TRACT
  Filled 2020-09-06: qty 28

## 2020-09-06 MED ORDER — SENNOSIDES-DOCUSATE SODIUM 8.6-50 MG PO TABS: 1.0000 | ORAL_TABLET | Freq: Every evening | ORAL | Status: DC | PRN

## 2020-09-06 MED ORDER — INSULIN ASPART 100 UNIT/ML IJ SOLN
0.0000 [IU] | Freq: Three times a day (TID) | INTRAMUSCULAR | Status: DC
  Administered 2020-09-07: 1 [IU] via SUBCUTANEOUS
  Administered 2020-09-07 – 2020-09-08 (×2): 2 [IU] via SUBCUTANEOUS
  Filled 2020-09-06: qty 1

## 2020-09-06 MED ORDER — SODIUM CHLORIDE 0.9 % IV SOLN: INTRAVENOUS | Status: AC

## 2020-09-06 MED ORDER — ACETAMINOPHEN 650 MG RE SUPP: 650.0000 mg | RECTAL | Status: DC | PRN

## 2020-09-06 NOTE — ED Notes (Signed)
Gave pt crackers and gingerale 

## 2020-09-06 NOTE — ED Triage Notes (Signed)
Stroke symptoms onset 3 hours ago per wife

## 2020-09-06 NOTE — Progress Notes (Signed)
[facility] Telemedicine Consult   Requesting Provider: Dr. Laverta Baltimore  Chief Complaint: right sided weakness, right facial droop, slurred speech  HPI: 55 year old male with history of CHF, COPD, hypertension, borderline diabetes presented to ED for right-sided weakness, slurred speech and right facial droop.  Per wife, she left home 12:30 PM and patient at his baseline.  When she came back around 2 PM and patient complained of dizziness, lightheadedness, found to have right-sided weakness and slurred speech and mild headache at the back of her head.  In ER, patient found to have right facial droop.  Wife stated that his speech seems to be better done at home.  BP 113/69, glucose 155.  Creatinine 2.4, WBC 13.0.  CT subacute right PCA stroke with questionable hemorrhagic transformation, chronic stroke at left frontal, left CR, right BG.  CTA head and neck right P1/P2 occlusion.  Per wife, patient stated that he did not feel well for the last 2 days, yesterday he complained some funny of his vision and plan to make appointment to see eye doctor.  Per chart, he has been following with pulmonology for COPD and shortness of breath, following with cardiology for shortness of breath.  Had previous stress test showed EF 35%, on home medication with aspirin, Plavix, Lipitor, Imdur, chlorthalidone, losartan and amlodipine.  Wife stated that he compliant with medication.  LKW: 12:30 PM tpa given?: No, subacute infarct and right PCA IR Thrombectomy? No, no LVO on the left, right P1/P2 occlusion associate with subacute septic stroke. Modified Rankin Scale: 1-No significant post stroke disability and can perform usual duties with stroke symptoms   Exam: Vitals:   09/06/20 1700 09/06/20 1715  BP: 111/69 109/66  Pulse: 66 68  Resp: (!) 22 15  SpO2: 97% 94%     Pulse Rate:  [66-76] 68 (07/13 1715) Resp:  [15-22] 15 (07/13 1715) BP: (104-113)/(53-69) 109/66 (07/13 1715) SpO2:  [94 %-97 %] 94 %  (07/13 1715)  General - Well nourished, well developed, in no apparent distress.  Ophthalmologic - fundi not visualized due to noncooperation.  Cardiovascular - Regular rhythm and rate.  Neuro - awake, alert, eyes open, orientated to place, month and people, however stated his age 47 instead of 23. No aphasia, fluent language, following all simple commands, mild dysarthria. Able to name and repeat and read. No gaze palsy, tracking bilaterally, however left hemianopia.  Right facial droop. Tongue protrusion to the right. LUEs 5/5, no drift, however right upper extremity 3+/5 with drift but not touching bed within 10 seconds. LLEs 5/5, no drift, however, RLE not able to resolve against gravity. Sensation symmetrical bilaterally, left FTN intact, gait not tested.  Marland Kitchen   NIH Stroke Scale  Level Of Consciousness 0=Alert; keenly responsive 1=Arouse to minor stimulation 2=Requires repeated stimulation to arouse or movements to pain 3=postures or unresponsive 0  LOC Questions to Month and Age 70=Answers both questions correctly 1=Answers one question correctly or dysarthria/intubated/trauma/language barrier 2=Answers neither question correctly or aphasia 1  LOC Commands      -Open/Close eyes     -Open/close grip     -Pantomime commands if communication barrier 0=Performs both tasks correctly 1=Performs one task correctly 2=Performs neighter task correctly 0  Best Gaze     -Only assess horizontal gaze 0=Normal 1=Partial gaze palsy 2=Forced deviation, or total gaze paresis 0  Visual 0=No visual loss 1=Partial hemianopia 2=Complete hemianopia 3=Bilateral hemianopia (blind including cortical blindness) 2  Facial Palsy     -Use grimace if obtunded 0=Normal  symmetrical movement 1=Minor paralysis (asymmetry) 2=Partial paralysis (lower face) 3=Complete paralysis (upper and lower face) 2  Motor  0=No drift for 10/5 seconds 1=Drift, but does not hit bed 2=Some antigravity effort, hits   bed 3=No effort against gravity, limb falls 4=No movement 0=Amputation/joint fusion Right Arm 1     Leg 3    Left Arm 0     Leg 0  Limb Ataxia     - FNT/HTS 0=Absent or does not understand or paralyzed or amputation/joint fusion 1=Present in one limb 2=Present in two limbs 0  Sensory 0=Normal 1=Mild to moderate sensory loss 2=Severe to total sensory loss or coma/unresponsive 0  Best Language 0=No aphasia, normal 1=Mild to moderate aphasia 2=Severe aphasia 3=Mute, global aphasia, or coma/unresponsive 0  Dysarthria 0=Normal 1=Mild to moderate 2=Severe, unintelligible or mute/anarthric 0=intubated/unable to test 1  Extinction/Neglect 0=No abnormality 1=visual/tactile/auditory/spatia/personal inattention/Extinction to bilateral simultaneous stimulation 2=Profound neglect/extinction more than 1 modality  1  Total   11      Imaging Reviewed:  CT HEAD CODE STROKE WO CONTRAST  Result Date: 09/06/2020 CLINICAL DATA:  Code stroke.  Neuro deficit, acute stroke suspected. EXAM: CT HEAD WITHOUT CONTRAST TECHNIQUE: Contiguous axial images were obtained from the base of the skull through the vertex without intravenous contrast. COMPARISON:  CT head 11/22/2018. FINDINGS: Brain: Acute right PCA territory infarcts, including the right occipital lobe, right mesial temporal lobe and potentially the right thalamus and right aspect of the splenium of the corpus callosum. Associated edema and local mass effect. No midline shift. Mild hyperdensity in the right occipital lobe. Remote infarcts in the left frontal lobe with encephalomalacia. Remote infarcts in bilateral corona radiata and basal ganglia. Additional moderate patchy white matter hypoattenuation, most likely chronic microvascular ischemic disease. Similar atrophy. Similar mild ventriculomegaly, mildly disproportionate to the degree of sulcal enlargement. Additionally there is similar crowding of sulci at the vertex and a mildly acute callosal  angle. Vascular: Subtle hyperdensity within the right PCA (series 5, image 36; series 6, image 30), potentially thrombus. Intracranial atherosclerosis. Skull: No acute fracture. Sinuses/Orbits: Sinuses are largely clear.  Unremarkable orbits. Other: No mastoid effusions. IMPRESSION: 1. Acute right PCA territory infarcts, including the right occipital lobe, right mesial/parahippocampal temporal lobe and potentially the right thalamus and right aspect of the splenium of the corpus callosum. Associated edema and local mass effect without midline shift. 2. Mild hyperdensity in the right occipital lobe may represent spared cortex and/or petechial hemorrhage and warrants attention on follow-up. 3. Subtle hyperdensity within the right PCA (series 5, image 36; series 6, image 30), concerning for thrombus given the above findings. Recommend CTA. 4. Remote infarcts in the left frontal lobe and remote lacunar infarcts in bilateral corona radiata and basal ganglia. 5. Moderate chronic microvascular ischemic disease. 6. Similar mild ventriculomegaly, mildly disproportionate to the degree of sulcal enlargement. Findings could be secondary to central predominant volume loss versus normal pressure hydrocephalus. Code stroke imaging results were communicated on 09/06/2020 at 4:46 pm to provider Dr. Laverta Baltimore via telephone, who verbally acknowledged these results. Electronically Signed   By: Margaretha Sheffield MD   On: 09/06/2020 16:59     Labs reviewed in epic and pertinent values follow: Creatinine 2.4, WBC 13.0  Assessment:  55 year old male with history of CHF, COPD, hypertension, borderline diabetes presented to ED for right-sided weakness, slurred speech and right facial droop.  Last known well 12:30 PM, NIH score 11.  Per wife, patient had "funny vision" yesterday and plan to make appointment with  eye doctor, but no motor deficit yesterday. CT subacute right PCA stroke with questionable hemorrhagic transformation, chronic  stroke at left frontal, left CR, right BG.  CTA head and neck right P1/P2 occlusion.  Patient not a tPA candidate given subacute right PCA stroke with questionable hemorrhagic transformation.  Patient not IR candidate given no LVO around left, and right P1/P2 occlusion has associated with established/subacute stroke, no tissue to save.  However, patient seem to have 2 separate strokes one involved on the left subcortical, 1 on the right PCA.  Etiology for the strokes not quite clear, concerning for worsening CHF with potential cardioembolic source.  Recommend MRI brain and 2D echo for stroke work-up.  Recommend transfer to [facility] for further management.  Hold off antiplatelet at this time given questionable hemorrhagic transformation on the right PCA stroke.  Recommendations:  Recommend transfer to [facility] for further management Continue further stroke work up  Frequent neuro checks Telemetry monitoring MRI brain  Echocardiogram  UDS, fasting lipid panel and HgbA1C PT/OT/speech consult Permissive hypertension (only treat if BP > 180/105 given questionable right PCA hemorrhagic transformation) for 24-48 hours post stroke onset GI and DVT prophylaxis  Hold off antiplatelet at this time given questionable right PCA hemorrhagic transformation Stroke risk factor modification Discussed with Dr. Laverta Baltimore ED physician Will follow up with pt in [facility]   Consult Participants: Patient, wife, RN, me Location of the provider: [facility] Location of the patient: [facility]  This consult was provided via telemedicine with 2-way video and audio communication. The patient/family was informed that care would be provided in this way and agreed to receive care in this manner.   This patient is receiving care for possible acute neurological changes. There was 50 minutes of care by this provider at the time of service, including time for direct evaluation via telemedicine, review of medical records, imaging studies and  discussion of findings with providers, the patient and/or family.  I also discussed with Dr. Norma Fredrickson radiology and Dr. Laverta Baltimore EDP.  Rosalin Hawking, MD PhD Stroke Neurology 09/06/2020 5:24 PM

## 2020-09-06 NOTE — ED Provider Notes (Signed)
Emergency Department Provider Note   I have reviewed the triage vital signs and the nursing notes.   HISTORY  Chief Complaint Cerebrovascular Accident   HPI Craig Knight is a 55 y.o. male with past medical history reviewed below presents to the emergency department by private vehicle with stroke symptoms.  Patient developed right side face, arm, leg weakness with speech deficit.  The patient's wife accompanies him and reports that she last saw him normal at 12:30 PM today.  She went to run some errands and left him home.  He called her around 1 or 1:30 with complaint of not being able to walk and his wife reported that he sounded very different than normal.  Patient denies any pain in his abdomen, flank, back.  No chest pain.  He does have a mild headache.  The patient's wife states that he is on Plavix but is not aware of anticoagulation medications.   Level 5 caveat: Stroke symptoms.   Past Medical History:  Diagnosis Date   Asthma    Bronchitis    COPD (chronic obstructive pulmonary disease) ([place])    Diabetes mellitus without complication ([place])    Heart murmur 60/73/7106   soft systolic murmur 1/6   Hypertension    Sleep apnea    Stroke [facility])     Patient Active Problem List   Diagnosis Date Noted   Acute CVA (cerebrovascular accident) ([place]) 09/06/2020   Stroke ([place]) 11/23/2018   Numbness 11/23/2018   CVA (cerebral vascular accident) ([place]) 11/23/2018   Lung nodule 11/17/2017   Chest pressure 11/22/2016   Shortness of breath 11/22/2016   Depression, major, single episode, moderate ([place]) 10/14/2016   Special screening for malignant neoplasms, colon    Chronic obstructive pulmonary disease ([place]) 06/08/2016   Hypertensive emergency 02/16/2016   Hypertensive urgency 06/07/2015   CKD (chronic kidney disease), stage III ([place]) 06/07/2015   Pain in the chest    Elevated troponin    Chronic diastolic CHF (congestive heart failure) ([place]) 09/17/2013   DM type 2 (diabetes  mellitus, type 2) ([place]) 09/15/2013   Chest pain 08/01/2011   HTN (hypertension) 08/01/2011   Chest pain 07/22/2011   Renal insufficiency 07/22/2011   Tobacco abuse 07/22/2011   Hypertension     Past Surgical History:  Procedure Laterality Date   COLONOSCOPY N/A 09/18/2016   Procedure: COLONOSCOPY;  Surgeon: Danie Binder, MD;  Location: [facility] ENDO SUITE;  Service: Endoscopy;  Laterality: N/A;  10:30 AM   lipoma removal      Allergies Dust mite extract and Lisinopril  Family History  Problem Relation Age of Onset   Stroke Mother    Heart attack Father 12       CABG   Hypertension Sister    Stroke Brother    Hypertension Brother    Stomach cancer Brother    Stroke Maternal Grandmother    Heart attack Maternal Grandfather    Heart attack Paternal Grandmother     Social History Social History   Tobacco Use   Smoking status: Every Day    Packs/day: 0.25    Types: Cigarettes   Smokeless tobacco: Never  Vaping Use   Vaping Use: Never used  Substance Use Topics   Alcohol use: Yes    Alcohol/week: 0.0 standard drinks    Comment: occasionally   Drug use: No    Review of Systems  Constitutional: No fever/chills Eyes: No visual changes. ENT: No sore throat. Cardiovascular: Denies chest pain. Respiratory: Denies  shortness of breath. Gastrointestinal: No abdominal pain.  No nausea, no vomiting.  No diarrhea.  No constipation. Genitourinary: Negative for dysuria. Musculoskeletal: Negative for back pain. Skin: Negative for rash. Neurological: Positive HA with right side weakness and speech disturbance.   10-point ROS otherwise negative.  ____________________________________________   PHYSICAL EXAM:  VITAL SIGNS: Vitals:   09/08/20 0440 09/08/20 0732  BP: (!) 175/107 (!) 175/102  Pulse: 84 63  Resp: 18 20  Temp: (!) 97.4 F (36.3 C) 98.2 F (36.8 C)  SpO2: 97% 99%    Constitutional: Alert and oriented. Well appearing and in no acute distress. Eyes:  Conjunctivae are normal. PERRL.  Head: Atraumatic. Nose: No congestion/rhinnorhea. Mouth/Throat: Mucous membranes are moist.   Neck: No stridor. Cardiovascular: Normal rate, regular rhythm. Good peripheral circulation. Grossly normal heart sounds.   Respiratory: Normal respiratory effort.  No retractions. Lungs CTAB. Gastrointestinal: Soft and nontender. No distention.  Musculoskeletal: No lower extremity tenderness nor edema. No gross deformities of extremities. Neurologic:  Dysarthria noted.  Right facial droop with 3/5 strength in the RUE and RLE. No apparent sensory deficit. Skin:  Skin is warm, dry and intact. No rash noted.   ____________________________________________   LABS (all labs ordered are listed, but only abnormal results are displayed)  Labs Reviewed  CBC - Abnormal; Notable for the following components:      Result Value   WBC 13.0 (*)    All other components within normal limits  DIFFERENTIAL - Abnormal; Notable for the following components:   Neutro Abs 9.2 (*)    Monocytes Absolute 1.2 (*)    All other components within normal limits  COMPREHENSIVE METABOLIC PANEL - Abnormal; Notable for the following components:   Glucose, Bld 148 (*)    BUN 38 (*)    Creatinine, Ser 2.41 (*)    GFR, Estimated 31 (*)    All other components within normal limits  HEMOGLOBIN A1C - Abnormal; Notable for the following components:   Hgb A1c MFr Bld 7.6 (*)    All other components within normal limits  LIPID PANEL - Abnormal; Notable for the following components:   HDL 30 (*)    LDL Cholesterol 108 (*)    All other components within normal limits  GLUCOSE, CAPILLARY - Abnormal; Notable for the following components:   Glucose-Capillary 116 (*)    All other components within normal limits  GLUCOSE, CAPILLARY - Abnormal; Notable for the following components:   Glucose-Capillary 118 (*)    All other components within normal limits  BASIC METABOLIC PANEL - Abnormal; Notable  for the following components:   Glucose, Bld 113 (*)    BUN 25 (*)    Creatinine, Ser 1.47 (*)    GFR, Estimated 56 (*)    All other components within normal limits  GLUCOSE, CAPILLARY - Abnormal; Notable for the following components:   Glucose-Capillary 189 (*)    All other components within normal limits  CBC - Abnormal; Notable for the following components:   WBC 11.4 (*)    RBC 5.83 (*)    MCV 78.6 (*)    All other components within normal limits  CBG MONITORING, ED - Abnormal; Notable for the following components:   Glucose-Capillary 155 (*)    All other components within normal limits  I-STAT CHEM 8, ED - Abnormal; Notable for the following components:   BUN 36 (*)    Creatinine, Ser 2.40 (*)    Glucose, Bld 149 (*)    All  other components within normal limits  CBG MONITORING, ED - Abnormal; Notable for the following components:   Glucose-Capillary 142 (*)    All other components within normal limits  CBG MONITORING, ED - Abnormal; Notable for the following components:   Glucose-Capillary 137 (*)    All other components within normal limits  CBG MONITORING, ED - Abnormal; Notable for the following components:   Glucose-Capillary 158 (*)    All other components within normal limits  RESP PANEL BY RT-PCR (FLU A&B, COVID) ARPGX2  ETHANOL  PROTIME-INR  APTT  RAPID URINE DRUG SCREEN, HOSP PERFORMED  URINALYSIS, ROUTINE W REFLEX MICROSCOPIC  HIV ANTIBODY (ROUTINE TESTING W REFLEX)  MAGNESIUM   ____________________________________________  EKG   EKG Interpretation  Date/Time:  Thursday September 07 2020 16:50:07 EDT Ventricular Rate:  58 PR Interval:  200 QRS Duration: 92 QT Interval:  450 QTC Calculation: 442 R Axis:   -6 Text Interpretation: Sinus rhythm Probable left atrial enlargement LVH with secondary repolarization abnormality Anterior Q waves, possibly due to LVH Baseline wander in lead(s) V3 Confirmed by Pattricia Boss [phone]) on 09/08/2020 11:36:15 AM        ____________________________________________  RADIOLOGY  CT imaging reviewed.   ____________________________________________   PROCEDURES  Procedure(s) performed:   Procedures  CRITICAL CARE Performed by: Margette Fast Total critical care time: 40 minutes Critical care time was exclusive of separately billable procedures and treating other patients. Critical care was necessary to treat or prevent imminent or life-threatening deterioration. Critical care was time spent personally by me on the following activities: development of treatment plan with patient and/or surrogate as well as nursing, discussions with consultants, evaluation of patient's response to treatment, examination of patient, obtaining history from patient or surrogate, ordering and performing treatments and interventions, ordering and review of laboratory studies, ordering and review of radiographic studies, pulse oximetry and re-evaluation of patient's condition.  Nanda Quinton, MD Emergency Medicine  ____________________________________________   INITIAL IMPRESSION / ASSESSMENT AND PLAN / ED COURSE  Pertinent labs & imaging results that were available during my care of the patient were reviewed by me and considered in my medical decision making (see chart for details).   Patient arrives to the emergency department by private vehicle with his wife.  Last normal at 12:30 PM according to the wife.  He is just the tPA window. Deficits are fairly significant. Have activated CODE STROKE.   Discussed case with Radiology and Neurology. AKI noted but Neuro recommends moving forward with CTA given large deficits including vision field change.   Discussed patient's case with [facility] to request admission. Patient and family (if present) updated with plan. Care transferred to [facility] service.  I reviewed all nursing notes, vitals, pertinent old records, EKGs, labs, imaging (as  available).  ____________________________________________  FINAL CLINICAL IMPRESSION(S) / ED DIAGNOSES  Final diagnoses:  Cerebrovascular accident (CVA) due to embolism of cerebral artery ([place])     MEDICATIONS GIVEN DURING THIS VISIT:  Medications  0.9 %  sodium chloride infusion (0 mLs Intravenous Stopped 09/07/20 1836)  iohexol (OMNIPAQUE) 350 MG/ML injection 60 mL (60 mLs Intravenous Contrast Given 09/06/20 1706)     NEW OUTPATIENT MEDICATIONS STARTED DURING THIS VISIT:  Discharge Medication List as of 09/08/2020 11:31 AM     START taking these medications   Details  glipiZIDE (GLUCOTROL) 5 MG tablet Take 1 tablet (5 mg total) by mouth daily., Starting Fri 09/08/2020, Until Sat 09/08/2021, Normal    ticagrelor (BRILINTA) 90 MG TABS tablet Take  1 tablet (90 mg total) by mouth 2 (two) times daily. Start Plavix when you complete this for one month, Starting Fri 09/08/2020, Normal        Note:  This document was prepared using Dragon voice recognition software and may include unintentional dictation errors.  Nanda Quinton, MD, [facility] Emergency Medicine    Rodneshia Greenhouse, [facility], MD 09/11/20 2141

## 2020-09-06 NOTE — ED Notes (Signed)
ED Provider at bedside. 

## 2020-09-06 NOTE — H&P (Signed)
History and Physical    Craig Knight EPP:[id number] DOB: 1965-12-31 DOA: 09/06/2020  PCP: Rosita Fire, MD   Patient coming from: Home  I have personally briefly reviewed patient's old medical records in [place]  Chief Complaint: Right sided weakness  HPI: Craig Knight is a 55 y.o. male with medical history significant for hypertension, diabetes, CHF, CKD 3, COPD. Patient was brought to the ED spouse reports of right-sided weakness and speech change.  Was last seen normal at about 12:30 PM, by his wife, who is present at bedside and states patient was normal.  At about 1.30, patient called her saying he felt weird like he was drunk, and she reports patient's speech sounded abnormal.  So she got back home at about 2 PM, patient appeared to be off balance, stumbling, and was not able to get into the car due to weakness of his right lower extremity.  Patient speech was also slurred but he was still understandable.  Reports compliance with his Plavix and cholesterol medication. At the time of my evaluation, patient spouse reports patient's speech has improved.  ED Course: Code stroke was called, blood pressure systolic 60-630.  WBC 13.  Creatinine 2.41.  Head CT showed right acute PCA territory infarcts, including the right occipital lobe right mesial/parahippocampal temporal lobe and potentially the right thalamus and right aspect of the splenium of corpus callosum.  With edema and local mass-effect without midline shift. Patient was not a candidate for tPA given subacute right PCA stroke with questionable hemorrhagic transformation.  Review of Systems: As per HPI all other systems reviewed and negative.  Past Medical History:  Diagnosis Date   Asthma    Bronchitis    COPD (chronic obstructive pulmonary disease) ([place])    Diabetes mellitus without complication ([place])    Heart murmur 16/02/930   soft systolic murmur 1/6   Hypertension    Sleep apnea    Stroke [facility])      Past Surgical History:  Procedure Laterality Date   COLONOSCOPY N/A 09/18/2016   Procedure: COLONOSCOPY;  Surgeon: Danie Binder, MD;  Location: [facility];  Service: Endoscopy;  Laterality: N/A;  10:30 AM   lipoma removal       reports that he has been smoking cigarettes. He has been smoking an average of 0.25 packs per day. He has never used smokeless tobacco. He reports current alcohol use. He reports that he does not use drugs.  Allergies  Allergen Reactions   Dust Mite Extract     Sneezing   Lisinopril Cough    Family History  Problem Relation Age of Onset   Stroke Mother    Heart attack Father 28       CABG   Hypertension Sister    Stroke Brother    Hypertension Brother    Stomach cancer Brother    Stroke Maternal Grandmother    Heart attack Maternal Grandfather    Heart attack Paternal Grandmother    Prior to Admission medications   Medication Sig Start Date End Date Taking? Authorizing Provider  albuterol (VENTOLIN HFA) 108 (90 Base) MCG/ACT inhaler Inhale 2 puffs into the lungs every 6 (six) hours as needed for wheezing or shortness of breath. 01/11/19   Mannam, Hart Robinsons, MD  amLODipine (NORVASC) 10 MG tablet Take 1 tablet (10 mg total) by mouth daily for 30 days. 04/16/18 08/21/20  Carmin Muskrat, MD  aspirin EC 81 MG tablet Take 1 tablet (81 mg total) by mouth daily.  04/16/18   Carmin Muskrat, MD  atorvastatin (LIPITOR) 40 MG tablet Take 1 tablet (40 mg total) by mouth daily. 11/23/18 08/21/20  Manuella Ghazi, Pratik D, DO  chlorthalidone (HYGROTON) 25 MG tablet Take 1 tablet (25 mg total) by mouth daily for 30 days. 04/16/18 08/21/20  Carmin Muskrat, MD  clopidogrel (PLAVIX) 75 MG tablet Take 75 mg by mouth daily. 03/28/20   [provider]  Fluticasone-Umeclidin-Vilant (TRELEGY ELLIPTA) 100-62.5-25 MCG/INH AEPB Inhale 1 puff into the lungs daily. Patient not taking: No sig reported 01/11/19   Mannam, Hart Robinsons, MD  Fluticasone-Umeclidin-Vilant (TRELEGY ELLIPTA)  100-62.5-25 MCG/INH AEPB Inhale 1 puff into the lungs daily. 06/14/20   Mannam, Hart Robinsons, MD  gabapentin (NEURONTIN) 300 MG capsule Take 300 mg by mouth at bedtime. 12/10/18   [provider]  hydrALAZINE (APRESOLINE) 50 MG tablet Take 50 mg by mouth 2 (two) times daily. 07/27/20   [provider]  ipratropium-albuterol (DUONEB) 0.5-2.5 (3) MG/3ML SOLN Take 3 mLs by nebulization every 4 (four) hours as needed. 11/20/17   Fenton Foy, NP  isosorbide mononitrate (IMDUR) 60 MG 24 hr tablet Take 1 tablet (60 mg total) by mouth daily. 04/16/18   Carmin Muskrat, MD  losartan (COZAAR) 100 MG tablet Take 100 mg by mouth daily. 11/27/18   [provider]  metFORMIN (GLUCOPHAGE) 500 MG tablet Take 1 tablet (500 mg total) by mouth daily with breakfast. Patient taking differently: Take 500 mg by mouth 2 (two) times daily with a meal. 04/16/18   Carmin Muskrat, MD  nitroGLYCERIN (NITROSTAT) 0.4 MG SL tablet Place 1 tablet (0.4 mg total) under the tongue every 5 (five) minutes as needed for chest pain. 04/16/18   Carmin Muskrat, MD  NON FORMULARY Pt uses cpap nightly    [provider]  omeprazole (PRILOSEC) 40 MG capsule Take 40 mg by mouth daily. 09/18/18   [provider]    Physical Exam: Vitals:   09/06/20 1700 09/06/20 1715 09/06/20 1730 09/06/20 1745  BP: 111/69 109/66 (!) 97/56 (!) 97/55  Pulse: 66 68 79 62  Resp: (!) 22 15 17 20   SpO2: 97% 94% 91% 93%    Constitutional: NAD, calm, comfortable Vitals:   09/06/20 1700 09/06/20 1715 09/06/20 1730 09/06/20 1745  BP: 111/69 109/66 (!) 97/56 (!) 97/55  Pulse: 66 68 79 62  Resp: (!) 22 15 17 20   SpO2: 97% 94% 91% 93%   Eyes:, lids and conjunctivae normal ENMT: Mucous membranes are moist..  Neck: normal, supple, no masses, no thyromegaly Respiratory: clear to auscultation bilaterally, no wheezing, no crackles. Normal respiratory effort. No accessory muscle use.  Cardiovascular: Regular rate and  rhythm, no murmurs / rubs / gallops. No extremity edema. 2+ pedal pulses.   Abdomen: no tenderness, no masses palpated. No hepatosplenomegaly. Bowel sounds positive.  Musculoskeletal: no clubbing / cyanosis. No joint deformity upper and lower extremities. Good ROM, no contractures. Normal muscle tone.  Skin: no rashes, lesions, ulcers. No induration Neurologic:  Neurological:        GCS: GCS eye subscore is 4. GCS verbal subscore is 5. GCS motor subscore is 6.     Comments: Mental Status:  Alert, oriented, thought content appropriate, speech slightly slurred, but understandable, able to follow 2 step commands without difficulty.  Cranial Nerves:  II: Vision appears impaired on the left, he is unable to finger count currently on the left.  Pupillary reaction to light on the left reduced, but patient not cooperative with exam.   III,IV, VI:  ptosis not present, extra-ocular motions intact bilaterally  V,VII: smile assymmetric, with flattening of right nasolabial fold VIII: hearing grossly normal to voice  Motor: 3/5 strength right upper and 4/5 right lower extremity-barely able to lift against gravity, poor grip strength right upper extremity.  5/5 strength left upper and lower extremity. Sensory: Sensation intact to light touch in all extremities.  Cerebellar:   CV: distal pulses palpable throughout   Psychiatric: Normal judgment and insight. Alert and oriented x 3. Normal mood.   Labs on Admission: I have personally reviewed following labs and imaging studies  CBC: Recent Labs  Lab 09/06/20 1630 09/06/20 1637  WBC 13.0*  --   NEUTROABS 9.2*  --   HGB 15.3 16.0  HCT 45.9 47.0  MCV 81.0  --   PLT 271  --    Basic Metabolic Panel: Recent Labs  Lab 09/06/20 1637  NA 139  K 4.1  CL 103  GLUCOSE 149*  BUN 36*  CREATININE 2.40*   Coagulation Profile: Recent Labs  Lab 09/06/20 1630  INR 0.9   CBG: Recent Labs  Lab 09/05/20 1310 09/06/20 1624  GLUCAP 143* 155*     Radiological Exams on Admission: CT Angio Head W or Wo Contrast  Result Date: 09/06/2020 CLINICAL DATA:  Right-sided weakness, left-sided vision impairment. EXAM: CT ANGIOGRAPHY HEAD AND NECK TECHNIQUE: Multidetector CT imaging of the head and neck was performed using the standard protocol during bolus administration of intravenous contrast. Multiplanar CT image reconstructions and MIPs were obtained to evaluate the vascular anatomy. Carotid stenosis measurements (when applicable) are obtained utilizing NASCET criteria, using the distal internal carotid diameter as the denominator. CONTRAST:  54mL OMNIPAQUE IOHEXOL 350 MG/ML SOLN COMPARISON:  CT angiogram of the head and neck November 23, 2018. FINDINGS: CTA NECK FINDINGS Aortic arch: Common origin of the innominate and left common carotid artery from the aortic arch. Imaged portion shows no evidence of aneurysm or dissection. No significant stenosis of the major arch vessel origins. Right carotid system: Increased tortuosity of the cervical segment of the right ICA. No significant stenosis. Left carotid system: Mild atherosclerotic changes of the left carotid bifurcation without hemodynamically significant stenosis. Increased tortuosity of the cervical segment of the left ICA. Vertebral arteries: Codominant. No evidence of dissection, stenosis (50% or greater) or occlusion. Skeleton: No acute osseous abnormality. Periapical lucency at the right maxillary first molar tooth. Other neck: Enlarged thyroid gland with multiple hypodense nodules, the largest measuring up to 1.5 cm. Upper chest: Negative. Review of the MIP images confirms the above findings CTA HEAD FINDINGS Anterior circulation: Mild atherosclerotic changes of the bilateral carotid siphon without stenosis. Luminal irregularity seen along the bilateral MCA vascular trees with focus of moderate stenosis at a left M3/MCA superior division branch and mild stenosis at a right M3/MCA inferior division  branch. There is also luminal irregularity in the bilateral ACA vascular trees with focal area of moderate stenosis at the right A3/ACA segment. Fenestration of the left A1/ACA again noted. Posterior circulation: Atherosclerotic changes of the bilateral intracranial vertebral arteries with moderate stenosis on the left. No significant stenosis on the right. The basilar artery has normal course and caliber. There is an occlusion of the right PCA at the P1-P2 junction with poor distal flow. Atherosclerotic changes of the left posterior cerebral artery with mild-to-moderate stenosis at the P2 segment. Venous sinuses: As permitted by contrast timing, patent. Anatomic variants: Fenestration of the left A1/ACA. Review of the MIP images confirms the above findings IMPRESSION:  1. Positive for large vessel occlusion at the right P1-P2/PCA junction. 2. Intracranial atherosclerotic disease with moderate stenosis at a left M3/MCA superior division branch, mild stenosis at a right M3/MCA inferior division branch, moderate stenosis at the right A3/ACA segment, moderate stenosis at the left vertebral artery and mild-to-moderate stenosis at a left P2/PCA. 3. No significant atherosclerotic disease in the neck. These results were called by telephone at the time of interpretation on 09/06/2020 at 5:19 pm to Dr. Erlinda Hong, who verbally acknowledged these results. Electronically Signed   By: Pedro Earls M.D.   On: 09/06/2020 17:42   CT Angio Neck W and/or Wo Contrast  Result Date: 09/06/2020 CLINICAL DATA:  Right-sided weakness, left-sided vision impairment. EXAM: CT ANGIOGRAPHY HEAD AND NECK TECHNIQUE: Multidetector CT imaging of the head and neck was performed using the standard protocol during bolus administration of intravenous contrast. Multiplanar CT image reconstructions and MIPs were obtained to evaluate the vascular anatomy. Carotid stenosis measurements (when applicable) are obtained utilizing NASCET criteria,  using the distal internal carotid diameter as the denominator. CONTRAST:  63mL OMNIPAQUE IOHEXOL 350 MG/ML SOLN COMPARISON:  CT angiogram of the head and neck November 23, 2018. FINDINGS: CTA NECK FINDINGS Aortic arch: Common origin of the innominate and left common carotid artery from the aortic arch. Imaged portion shows no evidence of aneurysm or dissection. No significant stenosis of the major arch vessel origins. Right carotid system: Increased tortuosity of the cervical segment of the right ICA. No significant stenosis. Left carotid system: Mild atherosclerotic changes of the left carotid bifurcation without hemodynamically significant stenosis. Increased tortuosity of the cervical segment of the left ICA. Vertebral arteries: Codominant. No evidence of dissection, stenosis (50% or greater) or occlusion. Skeleton: No acute osseous abnormality. Periapical lucency at the right maxillary first molar tooth. Other neck: Enlarged thyroid gland with multiple hypodense nodules, the largest measuring up to 1.5 cm. Upper chest: Negative. Review of the MIP images confirms the above findings CTA HEAD FINDINGS Anterior circulation: Mild atherosclerotic changes of the bilateral carotid siphon without stenosis. Luminal irregularity seen along the bilateral MCA vascular trees with focus of moderate stenosis at a left M3/MCA superior division branch and mild stenosis at a right M3/MCA inferior division branch. There is also luminal irregularity in the bilateral ACA vascular trees with focal area of moderate stenosis at the right A3/ACA segment. Fenestration of the left A1/ACA again noted. Posterior circulation: Atherosclerotic changes of the bilateral intracranial vertebral arteries with moderate stenosis on the left. No significant stenosis on the right. The basilar artery has normal course and caliber. There is an occlusion of the right PCA at the P1-P2 junction with poor distal flow. Atherosclerotic changes of the left  posterior cerebral artery with mild-to-moderate stenosis at the P2 segment. Venous sinuses: As permitted by contrast timing, patent. Anatomic variants: Fenestration of the left A1/ACA. Review of the MIP images confirms the above findings IMPRESSION: 1. Positive for large vessel occlusion at the right P1-P2/PCA junction. 2. Intracranial atherosclerotic disease with moderate stenosis at a left M3/MCA superior division branch, mild stenosis at a right M3/MCA inferior division branch, moderate stenosis at the right A3/ACA segment, moderate stenosis at the left vertebral artery and mild-to-moderate stenosis at a left P2/PCA. 3. No significant atherosclerotic disease in the neck. These results were called by telephone at the time of interpretation on 09/06/2020 at 5:19 pm to Dr. Erlinda Hong, who verbally acknowledged these results. Electronically Signed   By: Sandre Kitty.D.  On: 09/06/2020 17:42   CT HEAD CODE STROKE WO CONTRAST  Result Date: 09/06/2020 CLINICAL DATA:  Code stroke.  Neuro deficit, acute stroke suspected. EXAM: CT HEAD WITHOUT CONTRAST TECHNIQUE: Contiguous axial images were obtained from the base of the skull through the vertex without intravenous contrast. COMPARISON:  CT head 11/22/2018. FINDINGS: Brain: Acute right PCA territory infarcts, including the right occipital lobe, right mesial temporal lobe and potentially the right thalamus and right aspect of the splenium of the corpus callosum. Associated edema and local mass effect. No midline shift. Mild hyperdensity in the right occipital lobe. Remote infarcts in the left frontal lobe with encephalomalacia. Remote infarcts in bilateral corona radiata and basal ganglia. Additional moderate patchy white matter hypoattenuation, most likely chronic microvascular ischemic disease. Similar atrophy. Similar mild ventriculomegaly, mildly disproportionate to the degree of sulcal enlargement. Additionally there is similar crowding of sulci at the  vertex and a mildly acute callosal angle. Vascular: Subtle hyperdensity within the right PCA (series 5, image 36; series 6, image 30), potentially thrombus. Intracranial atherosclerosis. Skull: No acute fracture. Sinuses/Orbits: Sinuses are largely clear.  Unremarkable orbits. Other: No mastoid effusions. IMPRESSION: 1. Acute right PCA territory infarcts, including the right occipital lobe, right mesial/parahippocampal temporal lobe and potentially the right thalamus and right aspect of the splenium of the corpus callosum. Associated edema and local mass effect without midline shift. 2. Mild hyperdensity in the right occipital lobe may represent spared cortex and/or petechial hemorrhage and warrants attention on follow-up. 3. Subtle hyperdensity within the right PCA (series 5, image 36; series 6, image 30), concerning for thrombus given the above findings. Recommend CTA. 4. Remote infarcts in the left frontal lobe and remote lacunar infarcts in bilateral corona radiata and basal ganglia. 5. Moderate chronic microvascular ischemic disease. 6. Similar mild ventriculomegaly, mildly disproportionate to the degree of sulcal enlargement. Findings could be secondary to central predominant volume loss versus normal pressure hydrocephalus. Code stroke imaging results were communicated on 09/06/2020 at 4:46 pm to provider Dr. Laverta Baltimore via telephone, who verbally acknowledged these results. Electronically Signed   By: Margaretha Sheffield MD   On: 09/06/2020 16:59    EKG: Independently reviewed.  Sinus rhythm rate 75, QTc 404 no significant change from prior.  Assessment/Plan Principal Problem:   Acute CVA (cerebrovascular accident) ([place]) Active Problems:   Hypertension   DM type 2 (diabetes mellitus, type 2) ([facility])   Chronic diastolic CHF (congestive heart failure) ([facility])   CKD (chronic kidney disease), stage III ([facility])   Chronic obstructive pulmonary disease ([facility])   Acute CVA-right facial droop, slurred speech,  right-sided weakness. Head CT- Acute right PCA territory infarcts, including the right occipital lobe, right mesial/parahippocampal temporal lobe and potentially the right thalamus and right aspect of the splenium of the corpus callosum. Associated edema and local mass effect without midline shift.  Has been compliant with Plavix, spouse tells me patient not taking aspirin. -Telemetry neurologist Dr. Erlinda Hong, patient not a candidate for any intervention, tPA not given, -subacute right PCA stroke with questionable hemorrhagic transformation.  Recommends admission to [facility], per note-patient seems to have had 2 separate stroke 1 involving the left subcortical, one on the right PCA.  Etiology of stroke is unclear, concerning for worsening CHF with potential cardioembolic source.  Recommends holding off on antiplatelet at this time given questionable hemorrhagic transformation of the right PCA - MRI -2D echo - Lipid panel, hemoglobin A1c - UDS -Frequent neurochecks -UDS -Permissive hypertension, treat systolic greater than 127/517 -Passed bedside  swallow evaluation -PT OT speech therapy evaluation -Resume atorvastatin  HTN- systolic 97 - 677 -Allow for permissive hypertension, hold hydralazine, losartan, chlorthalidone, Imdur -PRN Hydralazine  Diabetes mellitus -random glucose 148 -Hold home metformin - SSI- s  Diastolic CHF-stable and compensated.  Last echo 2019- 65 to 70% EF G1DD.  AKI on CKD 3-creatinine 2.41, baseline about 1.8. - N/s 100cc/hr x 20hrs -Hold losartan, HCTZ  Leukocytosis of 13.  ? Stress reaction -Trend -Follow-up UA  COPD-stable -Resume home BD regimen  DVT prophylaxis: SCDs Code Status:  Full Family Communication: SPouse at bedside Disposition Plan: ~ 2 days Consults called:  neurology Admission status: Inpt, tele I certify that at the point of admission it is my clinical judgment that the patient will require inpatient hospital care spanning beyond 2  midnights from the point of admission due to high intensity of service, high risk for further deterioration and high frequency of surveillance required. The following factors support the patient status of inpatient:    Bethena Roys MD [facility]  09/06/2020, 10:15 PM

## 2020-09-06 NOTE — H&P (View-Only) (Signed)
History and Physical    Craig Knight:[id number] DOB: Jan 30, 1966 DOA: 09/06/2020  PCP: Rosita Fire, MD   Patient coming from: Home  I have personally briefly reviewed patient's old medical records in [place]  Chief Complaint: Right sided weakness  HPI: Craig Knight is a 55 y.o. male with medical history significant for hypertension, diabetes, CHF, CKD 3, COPD. Patient was brought to the ED spouse reports of right-sided weakness and speech change.  Was last seen normal at about 12:30 PM, by his wife, who is present at bedside and states patient was normal.  At about 1.30, patient called her saying he felt weird like he was drunk, and she reports patient's speech sounded abnormal.  So she got back home at about 2 PM, patient appeared to be off balance, stumbling, and was not able to get into the car due to weakness of his right lower extremity.  Patient speech was also slurred but he was still understandable.  Reports compliance with his Plavix and cholesterol medication. At the time of my evaluation, patient spouse reports patient's speech has improved.  ED Course: Code stroke was called, blood pressure systolic 47-096.  WBC 13.  Creatinine 2.41.  Head CT showed right acute PCA territory infarcts, including the right occipital lobe right mesial/parahippocampal temporal lobe and potentially the right thalamus and right aspect of the splenium of corpus callosum.  With edema and local mass-effect without midline shift. Patient was not a candidate for tPA given subacute right PCA stroke with questionable hemorrhagic transformation.  Review of Systems: As per HPI all other systems reviewed and negative.  Past Medical History:  Diagnosis Date   Asthma    Bronchitis    COPD (chronic obstructive pulmonary disease) ([place])    Diabetes mellitus without complication ([place])    Heart murmur 28/36/6294   soft systolic murmur 1/6   Hypertension    Sleep apnea    Stroke [facility])      Past Surgical History:  Procedure Laterality Date   COLONOSCOPY N/A 09/18/2016   Procedure: COLONOSCOPY;  Surgeon: Danie Binder, MD;  Location: [facility];  Service: Endoscopy;  Laterality: N/A;  10:30 AM   lipoma removal       reports that he has been smoking cigarettes. He has been smoking an average of 0.25 packs per day. He has never used smokeless tobacco. He reports current alcohol use. He reports that he does not use drugs.  Allergies  Allergen Reactions   Dust Mite Extract     Sneezing   Lisinopril Cough    Family History  Problem Relation Age of Onset   Stroke Mother    Heart attack Father 45       CABG   Hypertension Sister    Stroke Brother    Hypertension Brother    Stomach cancer Brother    Stroke Maternal Grandmother    Heart attack Maternal Grandfather    Heart attack Paternal Grandmother    Prior to Admission medications   Medication Sig Start Date End Date Taking? Authorizing Provider  albuterol (VENTOLIN HFA) 108 (90 Base) MCG/ACT inhaler Inhale 2 puffs into the lungs every 6 (six) hours as needed for wheezing or shortness of breath. 01/11/19   Mannam, Hart Robinsons, MD  amLODipine (NORVASC) 10 MG tablet Take 1 tablet (10 mg total) by mouth daily for 30 days. 04/16/18 08/21/20  Carmin Muskrat, MD  aspirin EC 81 MG tablet Take 1 tablet (81 mg total) by mouth daily.  04/16/18   Carmin Muskrat, MD  atorvastatin (LIPITOR) 40 MG tablet Take 1 tablet (40 mg total) by mouth daily. 11/23/18 08/21/20  Manuella Ghazi, Pratik D, DO  chlorthalidone (HYGROTON) 25 MG tablet Take 1 tablet (25 mg total) by mouth daily for 30 days. 04/16/18 08/21/20  Carmin Muskrat, MD  clopidogrel (PLAVIX) 75 MG tablet Take 75 mg by mouth daily. 03/28/20   [provider]  Fluticasone-Umeclidin-Vilant (TRELEGY ELLIPTA) 100-62.5-25 MCG/INH AEPB Inhale 1 puff into the lungs daily. Patient not taking: No sig reported 01/11/19   Mannam, Hart Robinsons, MD  Fluticasone-Umeclidin-Vilant (TRELEGY ELLIPTA)  100-62.5-25 MCG/INH AEPB Inhale 1 puff into the lungs daily. 06/14/20   Mannam, Hart Robinsons, MD  gabapentin (NEURONTIN) 300 MG capsule Take 300 mg by mouth at bedtime. 12/10/18   [provider]  hydrALAZINE (APRESOLINE) 50 MG tablet Take 50 mg by mouth 2 (two) times daily. 07/27/20   [provider]  ipratropium-albuterol (DUONEB) 0.5-2.5 (3) MG/3ML SOLN Take 3 mLs by nebulization every 4 (four) hours as needed. 11/20/17   Fenton Foy, NP  isosorbide mononitrate (IMDUR) 60 MG 24 hr tablet Take 1 tablet (60 mg total) by mouth daily. 04/16/18   Carmin Muskrat, MD  losartan (COZAAR) 100 MG tablet Take 100 mg by mouth daily. 11/27/18   [provider]  metFORMIN (GLUCOPHAGE) 500 MG tablet Take 1 tablet (500 mg total) by mouth daily with breakfast. Patient taking differently: Take 500 mg by mouth 2 (two) times daily with a meal. 04/16/18   Carmin Muskrat, MD  nitroGLYCERIN (NITROSTAT) 0.4 MG SL tablet Place 1 tablet (0.4 mg total) under the tongue every 5 (five) minutes as needed for chest pain. 04/16/18   Carmin Muskrat, MD  NON FORMULARY Pt uses cpap nightly    [provider]  omeprazole (PRILOSEC) 40 MG capsule Take 40 mg by mouth daily. 09/18/18   [provider]    Physical Exam: Vitals:   09/06/20 1700 09/06/20 1715 09/06/20 1730 09/06/20 1745  BP: 111/69 109/66 (!) 97/56 (!) 97/55  Pulse: 66 68 79 62  Resp: (!) 22 15 17 20   SpO2: 97% 94% 91% 93%    Constitutional: NAD, calm, comfortable Vitals:   09/06/20 1700 09/06/20 1715 09/06/20 1730 09/06/20 1745  BP: 111/69 109/66 (!) 97/56 (!) 97/55  Pulse: 66 68 79 62  Resp: (!) 22 15 17 20   SpO2: 97% 94% 91% 93%   Eyes:, lids and conjunctivae normal ENMT: Mucous membranes are moist..  Neck: normal, supple, no masses, no thyromegaly Respiratory: clear to auscultation bilaterally, no wheezing, no crackles. Normal respiratory effort. No accessory muscle use.  Cardiovascular: Regular rate and  rhythm, no murmurs / rubs / gallops. No extremity edema. 2+ pedal pulses.   Abdomen: no tenderness, no masses palpated. No hepatosplenomegaly. Bowel sounds positive.  Musculoskeletal: no clubbing / cyanosis. No joint deformity upper and lower extremities. Good ROM, no contractures. Normal muscle tone.  Skin: no rashes, lesions, ulcers. No induration Neurologic:  Neurological:        GCS: GCS eye subscore is 4. GCS verbal subscore is 5. GCS motor subscore is 6.     Comments: Mental Status:  Alert, oriented, thought content appropriate, speech slightly slurred, but understandable, able to follow 2 step commands without difficulty.  Cranial Nerves:  II: Vision appears impaired on the left, he is unable to finger count currently on the left.  Pupillary reaction to light on the left reduced, but patient not cooperative with exam.   III,IV, VI:  ptosis not present, extra-ocular motions intact bilaterally  V,VII: smile assymmetric, with flattening of right nasolabial fold VIII: hearing grossly normal to voice  Motor: 3/5 strength right upper and 4/5 right lower extremity-barely able to lift against gravity, poor grip strength right upper extremity.  5/5 strength left upper and lower extremity. Sensory: Sensation intact to light touch in all extremities.  Cerebellar:   CV: distal pulses palpable throughout   Psychiatric: Normal judgment and insight. Alert and oriented x 3. Normal mood.   Labs on Admission: I have personally reviewed following labs and imaging studies  CBC: Recent Labs  Lab 09/06/20 1630 09/06/20 1637  WBC 13.0*  --   NEUTROABS 9.2*  --   HGB 15.3 16.0  HCT 45.9 47.0  MCV 81.0  --   PLT 271  --    Basic Metabolic Panel: Recent Labs  Lab 09/06/20 1637  NA 139  K 4.1  CL 103  GLUCOSE 149*  BUN 36*  CREATININE 2.40*   Coagulation Profile: Recent Labs  Lab 09/06/20 1630  INR 0.9   CBG: Recent Labs  Lab 09/05/20 1310 09/06/20 1624  GLUCAP 143* 155*     Radiological Exams on Admission: CT Angio Head W or Wo Contrast  Result Date: 09/06/2020 CLINICAL DATA:  Right-sided weakness, left-sided vision impairment. EXAM: CT ANGIOGRAPHY HEAD AND NECK TECHNIQUE: Multidetector CT imaging of the head and neck was performed using the standard protocol during bolus administration of intravenous contrast. Multiplanar CT image reconstructions and MIPs were obtained to evaluate the vascular anatomy. Carotid stenosis measurements (when applicable) are obtained utilizing NASCET criteria, using the distal internal carotid diameter as the denominator. CONTRAST:  77mL OMNIPAQUE IOHEXOL 350 MG/ML SOLN COMPARISON:  CT angiogram of the head and neck November 23, 2018. FINDINGS: CTA NECK FINDINGS Aortic arch: Common origin of the innominate and left common carotid artery from the aortic arch. Imaged portion shows no evidence of aneurysm or dissection. No significant stenosis of the major arch vessel origins. Right carotid system: Increased tortuosity of the cervical segment of the right ICA. No significant stenosis. Left carotid system: Mild atherosclerotic changes of the left carotid bifurcation without hemodynamically significant stenosis. Increased tortuosity of the cervical segment of the left ICA. Vertebral arteries: Codominant. No evidence of dissection, stenosis (50% or greater) or occlusion. Skeleton: No acute osseous abnormality. Periapical lucency at the right maxillary first molar tooth. Other neck: Enlarged thyroid gland with multiple hypodense nodules, the largest measuring up to 1.5 cm. Upper chest: Negative. Review of the MIP images confirms the above findings CTA HEAD FINDINGS Anterior circulation: Mild atherosclerotic changes of the bilateral carotid siphon without stenosis. Luminal irregularity seen along the bilateral MCA vascular trees with focus of moderate stenosis at a left M3/MCA superior division branch and mild stenosis at a right M3/MCA inferior division  branch. There is also luminal irregularity in the bilateral ACA vascular trees with focal area of moderate stenosis at the right A3/ACA segment. Fenestration of the left A1/ACA again noted. Posterior circulation: Atherosclerotic changes of the bilateral intracranial vertebral arteries with moderate stenosis on the left. No significant stenosis on the right. The basilar artery has normal course and caliber. There is an occlusion of the right PCA at the P1-P2 junction with poor distal flow. Atherosclerotic changes of the left posterior cerebral artery with mild-to-moderate stenosis at the P2 segment. Venous sinuses: As permitted by contrast timing, patent. Anatomic variants: Fenestration of the left A1/ACA. Review of the MIP images confirms the above findings IMPRESSION:  1. Positive for large vessel occlusion at the right P1-P2/PCA junction. 2. Intracranial atherosclerotic disease with moderate stenosis at a left M3/MCA superior division branch, mild stenosis at a right M3/MCA inferior division branch, moderate stenosis at the right A3/ACA segment, moderate stenosis at the left vertebral artery and mild-to-moderate stenosis at a left P2/PCA. 3. No significant atherosclerotic disease in the neck. These results were called by telephone at the time of interpretation on 09/06/2020 at 5:19 pm to Dr. Erlinda Hong, who verbally acknowledged these results. Electronically Signed   By: Pedro Earls M.D.   On: 09/06/2020 17:42   CT Angio Neck W and/or Wo Contrast  Result Date: 09/06/2020 CLINICAL DATA:  Right-sided weakness, left-sided vision impairment. EXAM: CT ANGIOGRAPHY HEAD AND NECK TECHNIQUE: Multidetector CT imaging of the head and neck was performed using the standard protocol during bolus administration of intravenous contrast. Multiplanar CT image reconstructions and MIPs were obtained to evaluate the vascular anatomy. Carotid stenosis measurements (when applicable) are obtained utilizing NASCET criteria,  using the distal internal carotid diameter as the denominator. CONTRAST:  71mL OMNIPAQUE IOHEXOL 350 MG/ML SOLN COMPARISON:  CT angiogram of the head and neck November 23, 2018. FINDINGS: CTA NECK FINDINGS Aortic arch: Common origin of the innominate and left common carotid artery from the aortic arch. Imaged portion shows no evidence of aneurysm or dissection. No significant stenosis of the major arch vessel origins. Right carotid system: Increased tortuosity of the cervical segment of the right ICA. No significant stenosis. Left carotid system: Mild atherosclerotic changes of the left carotid bifurcation without hemodynamically significant stenosis. Increased tortuosity of the cervical segment of the left ICA. Vertebral arteries: Codominant. No evidence of dissection, stenosis (50% or greater) or occlusion. Skeleton: No acute osseous abnormality. Periapical lucency at the right maxillary first molar tooth. Other neck: Enlarged thyroid gland with multiple hypodense nodules, the largest measuring up to 1.5 cm. Upper chest: Negative. Review of the MIP images confirms the above findings CTA HEAD FINDINGS Anterior circulation: Mild atherosclerotic changes of the bilateral carotid siphon without stenosis. Luminal irregularity seen along the bilateral MCA vascular trees with focus of moderate stenosis at a left M3/MCA superior division branch and mild stenosis at a right M3/MCA inferior division branch. There is also luminal irregularity in the bilateral ACA vascular trees with focal area of moderate stenosis at the right A3/ACA segment. Fenestration of the left A1/ACA again noted. Posterior circulation: Atherosclerotic changes of the bilateral intracranial vertebral arteries with moderate stenosis on the left. No significant stenosis on the right. The basilar artery has normal course and caliber. There is an occlusion of the right PCA at the P1-P2 junction with poor distal flow. Atherosclerotic changes of the left  posterior cerebral artery with mild-to-moderate stenosis at the P2 segment. Venous sinuses: As permitted by contrast timing, patent. Anatomic variants: Fenestration of the left A1/ACA. Review of the MIP images confirms the above findings IMPRESSION: 1. Positive for large vessel occlusion at the right P1-P2/PCA junction. 2. Intracranial atherosclerotic disease with moderate stenosis at a left M3/MCA superior division branch, mild stenosis at a right M3/MCA inferior division branch, moderate stenosis at the right A3/ACA segment, moderate stenosis at the left vertebral artery and mild-to-moderate stenosis at a left P2/PCA. 3. No significant atherosclerotic disease in the neck. These results were called by telephone at the time of interpretation on 09/06/2020 at 5:19 pm to Dr. Erlinda Hong, who verbally acknowledged these results. Electronically Signed   By: Sandre Kitty.D.  On: 09/06/2020 17:42   CT HEAD CODE STROKE WO CONTRAST  Result Date: 09/06/2020 CLINICAL DATA:  Code stroke.  Neuro deficit, acute stroke suspected. EXAM: CT HEAD WITHOUT CONTRAST TECHNIQUE: Contiguous axial images were obtained from the base of the skull through the vertex without intravenous contrast. COMPARISON:  CT head 11/22/2018. FINDINGS: Brain: Acute right PCA territory infarcts, including the right occipital lobe, right mesial temporal lobe and potentially the right thalamus and right aspect of the splenium of the corpus callosum. Associated edema and local mass effect. No midline shift. Mild hyperdensity in the right occipital lobe. Remote infarcts in the left frontal lobe with encephalomalacia. Remote infarcts in bilateral corona radiata and basal ganglia. Additional moderate patchy white matter hypoattenuation, most likely chronic microvascular ischemic disease. Similar atrophy. Similar mild ventriculomegaly, mildly disproportionate to the degree of sulcal enlargement. Additionally there is similar crowding of sulci at the  vertex and a mildly acute callosal angle. Vascular: Subtle hyperdensity within the right PCA (series 5, image 36; series 6, image 30), potentially thrombus. Intracranial atherosclerosis. Skull: No acute fracture. Sinuses/Orbits: Sinuses are largely clear.  Unremarkable orbits. Other: No mastoid effusions. IMPRESSION: 1. Acute right PCA territory infarcts, including the right occipital lobe, right mesial/parahippocampal temporal lobe and potentially the right thalamus and right aspect of the splenium of the corpus callosum. Associated edema and local mass effect without midline shift. 2. Mild hyperdensity in the right occipital lobe may represent spared cortex and/or petechial hemorrhage and warrants attention on follow-up. 3. Subtle hyperdensity within the right PCA (series 5, image 36; series 6, image 30), concerning for thrombus given the above findings. Recommend CTA. 4. Remote infarcts in the left frontal lobe and remote lacunar infarcts in bilateral corona radiata and basal ganglia. 5. Moderate chronic microvascular ischemic disease. 6. Similar mild ventriculomegaly, mildly disproportionate to the degree of sulcal enlargement. Findings could be secondary to central predominant volume loss versus normal pressure hydrocephalus. Code stroke imaging results were communicated on 09/06/2020 at 4:46 pm to provider Dr. Laverta Baltimore via telephone, who verbally acknowledged these results. Electronically Signed   By: Margaretha Sheffield MD   On: 09/06/2020 16:59    EKG: Independently reviewed.  Sinus rhythm rate 75, QTc 404 no significant change from prior.  Assessment/Plan Principal Problem:   Acute CVA (cerebrovascular accident) ([place]) Active Problems:   Hypertension   DM type 2 (diabetes mellitus, type 2) ([facility])   Chronic diastolic CHF (congestive heart failure) ([facility])   CKD (chronic kidney disease), stage III ([facility])   Chronic obstructive pulmonary disease ([facility])   Acute CVA-right facial droop, slurred speech,  right-sided weakness. Head CT- Acute right PCA territory infarcts, including the right occipital lobe, right mesial/parahippocampal temporal lobe and potentially the right thalamus and right aspect of the splenium of the corpus callosum. Associated edema and local mass effect without midline shift.  Has been compliant with Plavix, spouse tells me patient not taking aspirin. -Telemetry neurologist Dr. Erlinda Hong, patient not a candidate for any intervention, tPA not given, -subacute right PCA stroke with questionable hemorrhagic transformation.  Recommends admission to [facility], per note-patient seems to have had 2 separate stroke 1 involving the left subcortical, one on the right PCA.  Etiology of stroke is unclear, concerning for worsening CHF with potential cardioembolic source.  Recommends holding off on antiplatelet at this time given questionable hemorrhagic transformation of the right PCA - MRI -2D echo - Lipid panel, hemoglobin A1c - UDS -Frequent neurochecks -UDS -Permissive hypertension, treat systolic greater than 785/885 -Passed bedside  swallow evaluation -PT OT speech therapy evaluation -Resume atorvastatin  HTN- systolic 97 - 518 -Allow for permissive hypertension, hold hydralazine, losartan, chlorthalidone, Imdur -PRN Hydralazine  Diabetes mellitus -random glucose 148 -Hold home metformin - SSI- s  Diastolic CHF-stable and compensated.  Last echo 2019- 65 to 70% EF G1DD.  AKI on CKD 3-creatinine 2.41, baseline about 1.8. - N/s 100cc/hr x 20hrs -Hold losartan, HCTZ  Leukocytosis of 13.  ? Stress reaction -Trend -Follow-up UA  COPD-stable -Resume home BD regimen  DVT prophylaxis: SCDs Code Status:  Full Family Communication: SPouse at bedside Disposition Plan: ~ 2 days Consults called:  neurology Admission status: Inpt, tele I certify that at the point of admission it is my clinical judgment that the patient will require inpatient hospital care spanning beyond 2  midnights from the point of admission due to high intensity of service, high risk for further deterioration and high frequency of surveillance required. The following factors support the patient status of inpatient:    Bethena Roys MD [facility]  09/06/2020, 10:15 PM

## 2020-09-07 ENCOUNTER — Inpatient Hospital Stay (HOSPITAL_BASED_OUTPATIENT_CLINIC_OR_DEPARTMENT_OTHER): Payer: Medicare Other

## 2020-09-07 ENCOUNTER — Inpatient Hospital Stay (HOSPITAL_COMMUNITY): Payer: Medicare Other

## 2020-09-07 DIAGNOSIS — I6389 Other cerebral infarction: Secondary | ICD-10-CM | POA: Diagnosis not present

## 2020-09-07 DIAGNOSIS — I639 Cerebral infarction, unspecified: Secondary | ICD-10-CM

## 2020-09-07 LAB — URINALYSIS, ROUTINE W REFLEX MICROSCOPIC
Bilirubin Urine: NEGATIVE
Glucose, UA: NEGATIVE mg/dL
Hgb urine dipstick: NEGATIVE
Ketones, ur: NEGATIVE mg/dL
Leukocytes,Ua: NEGATIVE
Nitrite: NEGATIVE
Protein, ur: NEGATIVE mg/dL
Specific Gravity, Urine: 1.019 (ref 1.005–1.030)
pH: 5 (ref 5.0–8.0)

## 2020-09-07 LAB — CBG MONITORING, ED
Glucose-Capillary: 137 mg/dL — ABNORMAL HIGH (ref 70–99)
Glucose-Capillary: 142 mg/dL — ABNORMAL HIGH (ref 70–99)
Glucose-Capillary: 158 mg/dL — ABNORMAL HIGH (ref 70–99)

## 2020-09-07 LAB — RAPID URINE DRUG SCREEN, HOSP PERFORMED
Amphetamines: NOT DETECTED
Barbiturates: NOT DETECTED
Benzodiazepines: NOT DETECTED
Cocaine: NOT DETECTED
Opiates: NOT DETECTED
Tetrahydrocannabinol: NOT DETECTED

## 2020-09-07 LAB — LIPID PANEL
Cholesterol: 163 mg/dL (ref 0–200)
HDL: 30 mg/dL — ABNORMAL LOW (ref >40)
LDL Cholesterol: 108 mg/dL — ABNORMAL HIGH (ref 0–99)
Total CHOL/HDL Ratio: 5.4 RATIO
Triglycerides: 124 mg/dL (ref <150)
VLDL: 25 mg/dL (ref 0–40)

## 2020-09-07 LAB — GLUCOSE, CAPILLARY
Glucose-Capillary: 116 mg/dL — ABNORMAL HIGH (ref 70–99)
Glucose-Capillary: 118 mg/dL — ABNORMAL HIGH (ref 70–99)

## 2020-09-07 LAB — HEMOGLOBIN A1C
Hgb A1c MFr Bld: 7.6 % — ABNORMAL HIGH (ref 4.8–5.6)
Mean Plasma Glucose: 171.42 mg/dL

## 2020-09-07 LAB — ECHOCARDIOGRAM COMPLETE
AR max vel: 3.09 cm2
AV Area VTI: 2.38 cm2
AV Area mean vel: 3.16 cm2
AV Mean grad: 4 mmHg
AV Peak grad: 6.9 mmHg
Ao pk vel: 1.31 m/s
Area-P 1/2: 2.76 cm2
MV VTI: 1.9 cm2
S' Lateral: 2.8 cm

## 2020-09-07 LAB — HIV ANTIBODY (ROUTINE TESTING W REFLEX): HIV Screen 4th Generation wRfx: NONREACTIVE

## 2020-09-07 IMAGING — MR MR HEAD W/O CM
11 of 12 series · 40 of 48 positions shown · non-contrast
Comparison: CT code stroke [DATE].

CLINICAL DATA: Stroke, follow-up.  Slurred speech.

EXAM:
MRI HEAD WITHOUT CONTRAST
TECHNIQUE: Multiplanar, multiecho pulse sequences of the brain and surrounding
structures were obtained without intravenous contrast.

[Series 5: DWI · axial · 4.0mm · 0.88mm/px · z∈[-105,+35]mm · 4 of 36 slices shown (1 of 6)]
[im 1/36]
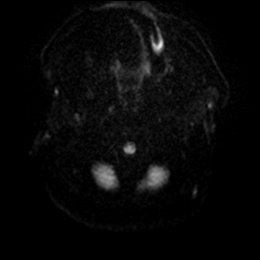
[im 12/36]
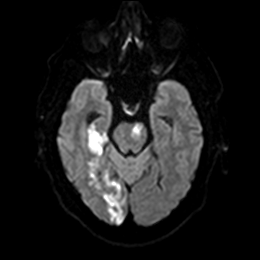
[im 24/36]
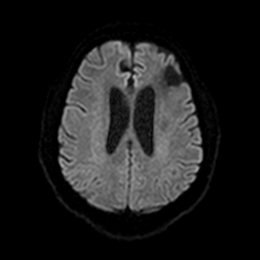
[im 36/36]
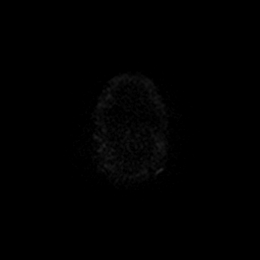

[Series 5: DWI · axial · 4.0mm · 0.88mm/px · z∈[-105,+35]mm · 4 of 36 slices shown (2 of 6)]
[im 1/36]
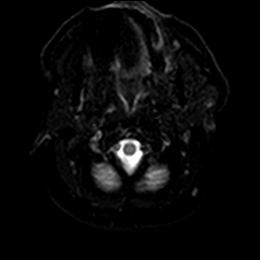
[im 12/36]
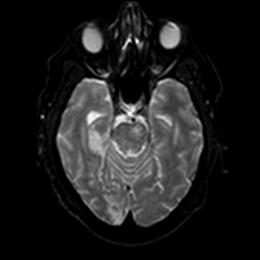
[im 24/36]
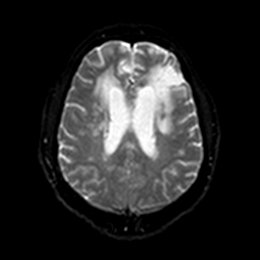
[im 36/36]
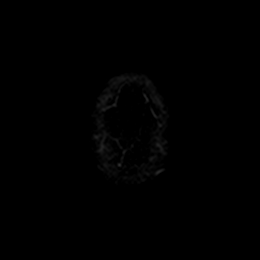

[Series 6: DWI · axial · 4.0mm · 0.88mm/px · z∈[-105,+35]mm · 4 of 36 slices shown (3 of 6)]
[im 1/36]
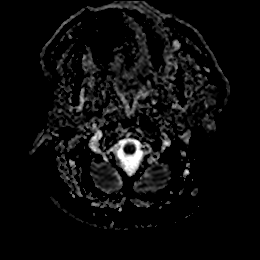
[im 12/36]
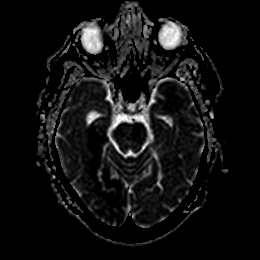
[im 24/36]
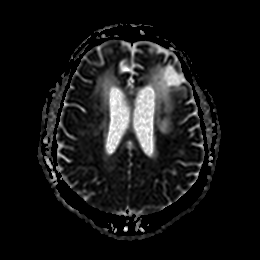
[im 36/36]
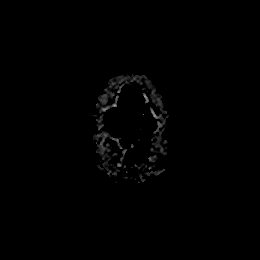

[Series 7: DWI · coronal · 5.0mm · 0.88mm/px · 3 of 28 slices shown (4 of 6)]
[im 1/28]
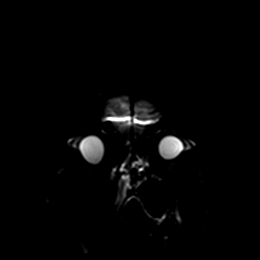
[im 14/28]
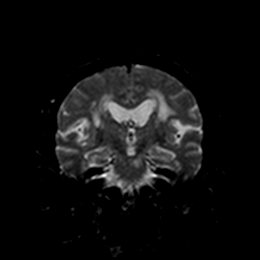
[im 28/28]
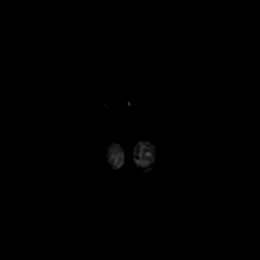

[Series 7: DWI · coronal · 5.0mm · 0.88mm/px · 4 of 28 slices shown (5 of 6)]
[im 1/28]
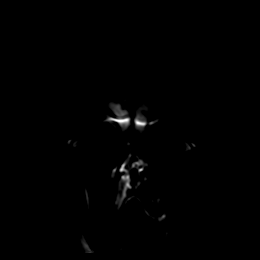
[im 10/28]
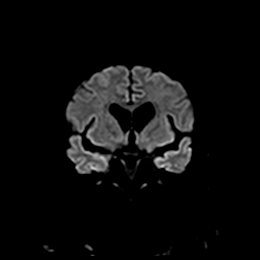
[im 19/28]
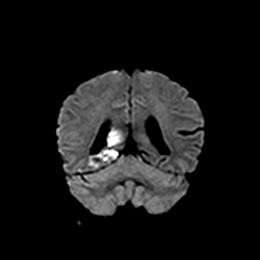
[im 28/28]
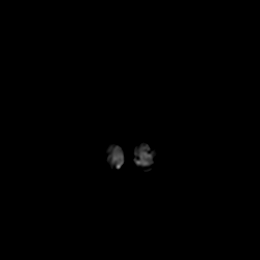

[Series 8: DWI · coronal · 5.0mm · 0.88mm/px · 4 of 28 slices shown (6 of 6)]
[im 1/28]
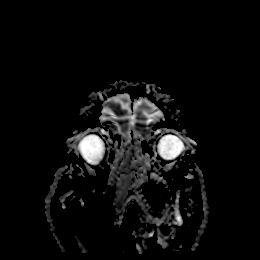
[im 10/28]
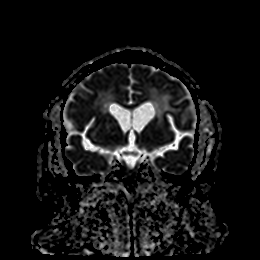
[im 19/28]
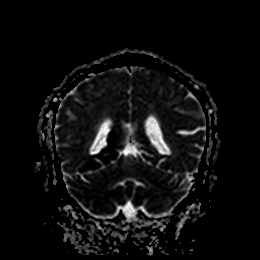
[im 28/28]
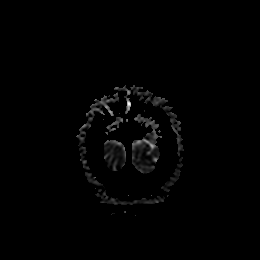

[Series 9: T1 · sagittal · 5.0mm · 0.94mm/px · 3 of 25 slices shown]
[im 1/25]
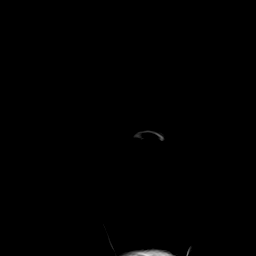
[im 13/25]
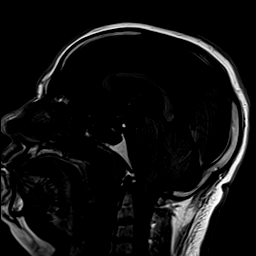
[im 25/25]
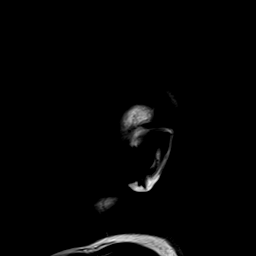

[Series 10: T2 · axial · 5.0mm · 0.72mm/px · z∈[-101,+31]mm · 3 of 20 slices shown (1 of 2)]
[im 1/20]
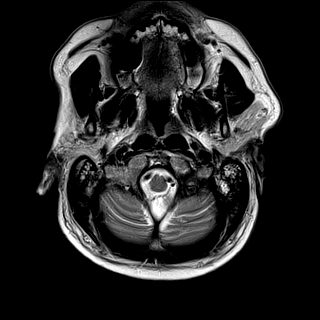
[im 10/20]
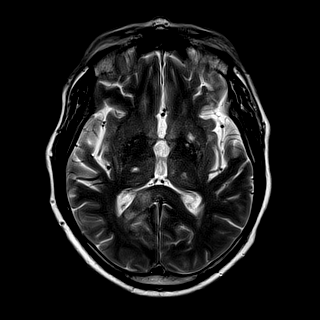
[im 20/20]
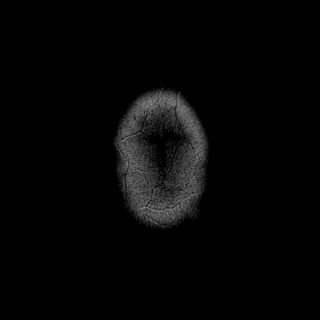

[Series 11: ax hemo · axial · 5.0mm · 0.86mm/px · z∈[-106,+37]mm · 3 of 25 slices shown]
[im 1/25]
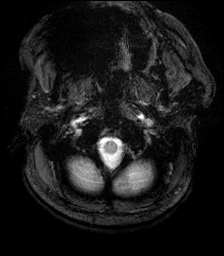
[im 13/25]
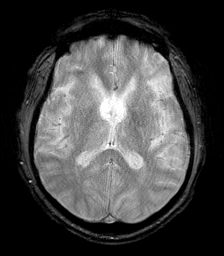
[im 25/25]
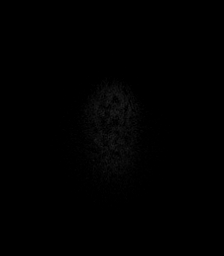

[Series 12: FLAIR · axial · 4.0mm · 0.43mm/px · z∈[-96,+27]mm · 4 of 32 slices shown]
[im 1/32]
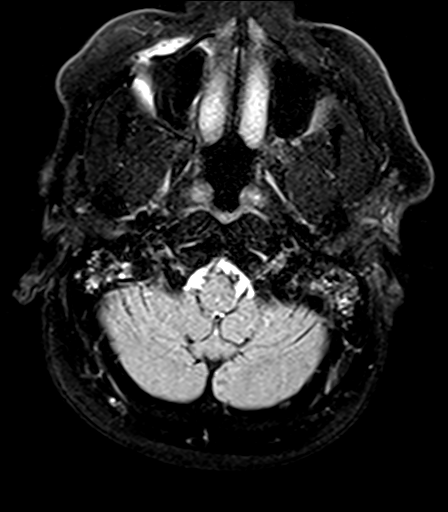
[im 11/32]
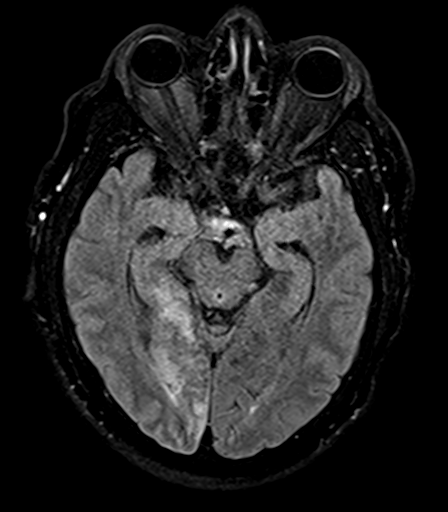
[im 21/32]
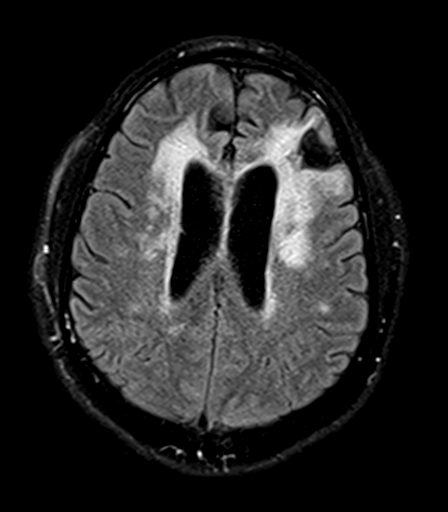
[im 32/32]
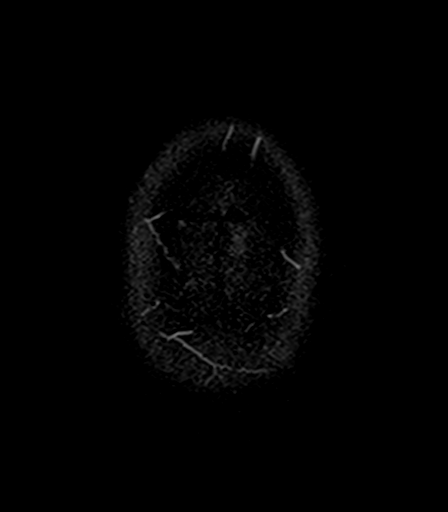

[Series 14: T2 · coronal · 5.0mm · 0.72mm/px · 4 of 28 slices shown (2 of 2)]
[im 1/28]
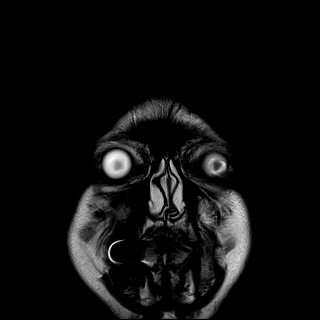
[im 10/28]
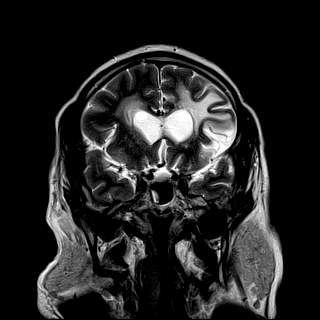
[im 19/28]
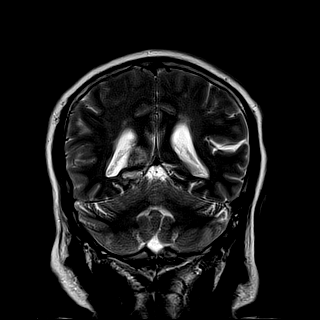
[im 28/28]
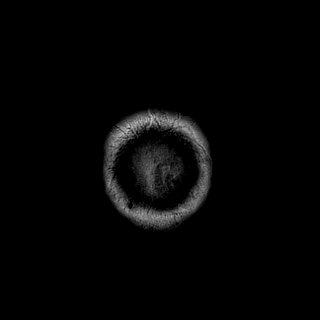

[40 of 48 positions shown; findings below may reference images not displayed]

FINDINGS: Brain: Acute right PCA territory infarcts involving the right
occipital lobe, parahippocampal/medial right temporal lobe, and
splenium of the corpus callosum. Punctate acute infarct in the
dorsal lateral. Right thalamus. There is associated edema and
regional mass effect without midline shift. Additional left pontine
acute infarct. No evidence of hemorrhagic transformation. Remote
left frontal infarct with encephalomalacia and gliosis. Remote
lacunar infarcts in bilateral basal ganglia,, corona radiata and
thalami. Additional moderate scattered T2/FLAIR hyperintensities
within the white matter, most likely related to chronic
microvascular ischemic disease. Atrophy. Similar mild
ventriculomegaly. Mild crowding of sulci at the vertex. No mass
lesion. No midline shift. Basal cisterns are patent. No extra-axial
fluid collection.

Vascular: See recent CTA for further evaluation.

Skull and upper cervical spine: Normal marrow signal.

Sinuses/Orbits: Left sphenoid sinus retention cyst. Mild ethmoid air
cell mucosal thickening. No acute orbital findings.

Other: Moderate bilateral mastoid effusions.
IMPRESSION: 1. Acute right PCA territory infarcts, as described above.
2. Acute left pontine infarct.
3. Associated edema and regional mass effect without midline shift.
No hemorrhagic transformation.
4. Remote left frontal infarct and remote lacunar infarcts in
bilateral basal ganglia,, corona radiata and thalami. Moderate
chronic microvascular ischemic disease.
5. Similar mild ventriculomegaly, most likely related to atrophy.
Normal pressure hydrocephalus is thought less likely, but is a
differential consideration.

## 2020-09-07 MED ORDER — ASPIRIN EC 81 MG PO TBEC
81.0000 mg | DELAYED_RELEASE_TABLET | Freq: Every day | ORAL | Status: DC
  Administered 2020-09-07 – 2020-09-08 (×2): 81 mg via ORAL
  Filled 2020-09-07 (×2): qty 1

## 2020-09-07 MED ORDER — ASPIRIN 325 MG PO TABS: 325.0000 mg | ORAL_TABLET | Freq: Every day | ORAL | Status: DC

## 2020-09-07 MED ORDER — ATORVASTATIN CALCIUM 80 MG PO TABS
80.0000 mg | ORAL_TABLET | Freq: Every day | ORAL | Status: DC
  Administered 2020-09-08: 80 mg via ORAL
  Filled 2020-09-07: qty 1

## 2020-09-07 MED ORDER — TICAGRELOR 90 MG PO TABS
90.0000 mg | ORAL_TABLET | Freq: Two times a day (BID) | ORAL | Status: DC
  Administered 2020-09-07 – 2020-09-08 (×3): 90 mg via ORAL
  Filled 2020-09-07 (×3): qty 1

## 2020-09-07 NOTE — Progress Notes (Signed)
  Echocardiogram 2D Echocardiogram has been performed.  Craig Knight 09/07/2020, 12:18 PM

## 2020-09-07 NOTE — Progress Notes (Signed)
STROKE TEAM PROGRESS NOTE   SUBJECTIVE (INTERVAL HISTORY) His wife and daughter are at the bedside.  Overall his condition is rapidly improving.  Patient weakness much improved from yesterday, still has mild right facial droop and slight right upper extremity pronator drift, right lower extremity near baseline.  Still has left hemianopia.  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   OBJECTIVE Temp:  [98.2 F (36.8 C)-98.4 F (36.9 C)] 98.4 F (36.9 C) (07/14 1948) Pulse Rate:  [51-151] 65 (07/14 1948) Resp:  [11-22] 17 (07/14 1948) BP: (107-180)/(50-114) 180/100 (07/14 1948) SpO2:  [92 %-100 %] 100 % (07/14 1948)  Recent Labs  Lab 09/06/20 1624 09/07/20 0002 09/07/20 0912 09/07/20 1124 09/07/20 1840  GLUCAP 155* 142* 137* 158* 116*   Recent Labs  Lab 09/06/20 1630 09/06/20 1637  NA 137 139  K 4.0 4.1  CL 101 103  CO2 26  --   GLUCOSE 148* 149*  BUN 38* 36*  CREATININE 2.41* 2.40*  CALCIUM 9.6  --    Recent Labs  Lab 09/06/20 1630  AST 16  ALT 20  ALKPHOS 97  BILITOT 0.3  PROT 7.2  ALBUMIN 4.3   Recent Labs  Lab 09/06/20 1630 09/06/20 1637  WBC 13.0*  --   NEUTROABS 9.2*  --   HGB 15.3 16.0  HCT 45.9 47.0  MCV 81.0  --   PLT 271  --    No results for input(s): CKTOTAL, CKMB, CKMBINDEX, TROPONINI in the last 168 hours. Recent Labs    09/06/20 1630  LABPROT 12.5  INR 0.9   Recent Labs    09/07/20 1803  COLORURINE YELLOW  LABSPEC 1.019  PHURINE 5.0  GLUCOSEU NEGATIVE  HGBUR NEGATIVE  BILIRUBINUR NEGATIVE  KETONESUR NEGATIVE  PROTEINUR NEGATIVE  NITRITE NEGATIVE  LEUKOCYTESUR NEGATIVE       Component Value Date/Time   CHOL 163 09/07/2020 0426   TRIG 124 09/07/2020 0426   HDL 30 (L) 09/07/2020 0426   CHOLHDL 5.4 09/07/2020 0426   VLDL 25 09/07/2020 0426   LDLCALC 108 (H) 09/07/2020 0426   Lab Results  Component Value Date   HGBA1C 7.6 (H) 09/07/2020      Component Value Date/Time   LABOPIA NONE DETECTED  11/23/2018 0143   COCAINSCRNUR POSITIVE (A) 11/23/2018 0143   LABBENZ NONE DETECTED 11/23/2018 0143   AMPHETMU NONE DETECTED 11/23/2018 0143   THCU NONE DETECTED 11/23/2018 0143   LABBARB NONE DETECTED 11/23/2018 0143    Recent Labs  Lab 09/06/20 Burt <10    I have personally reviewed the radiological images below and agree with the radiology interpretations.  CT Angio Head W or Wo Contrast  Result Date: 09/06/2020 CLINICAL DATA:  Right-sided weakness, left-sided vision impairment. EXAM: CT ANGIOGRAPHY HEAD AND NECK TECHNIQUE: Multidetector CT imaging of the head and neck was performed using the standard protocol during bolus administration of intravenous contrast. Multiplanar CT image reconstructions and MIPs were obtained to evaluate the vascular anatomy. Carotid stenosis measurements (when applicable) are obtained utilizing NASCET criteria, using the distal internal carotid diameter as the denominator. CONTRAST:  64mL OMNIPAQUE IOHEXOL 350 MG/ML SOLN COMPARISON:  CT angiogram of the head and neck November 23, 2018. FINDINGS: CTA NECK FINDINGS Aortic arch: Common origin of the innominate and left common carotid artery from the aortic arch. Imaged portion shows no evidence of aneurysm or dissection. No significant stenosis of the major arch vessel origins. Right carotid system: Increased tortuosity of the cervical segment  of the right ICA. No significant stenosis. Left carotid system: Mild atherosclerotic changes of the left carotid bifurcation without hemodynamically significant stenosis. Increased tortuosity of the cervical segment of the left ICA. Vertebral arteries: Codominant. No evidence of dissection, stenosis (50% or greater) or occlusion. Skeleton: No acute osseous abnormality. Periapical lucency at the right maxillary first molar tooth. Other neck: Enlarged thyroid gland with multiple hypodense nodules, the largest measuring up to 1.5 cm. Upper chest: Negative. Review of the MIP  images confirms the above findings CTA HEAD FINDINGS Anterior circulation: Mild atherosclerotic changes of the bilateral carotid siphon without stenosis. Luminal irregularity seen along the bilateral MCA vascular trees with focus of moderate stenosis at a left M3/MCA superior division branch and mild stenosis at a right M3/MCA inferior division branch. There is also luminal irregularity in the bilateral ACA vascular trees with focal area of moderate stenosis at the right A3/ACA segment. Fenestration of the left A1/ACA again noted. Posterior circulation: Atherosclerotic changes of the bilateral intracranial vertebral arteries with moderate stenosis on the left. No significant stenosis on the right. The basilar artery has normal course and caliber. There is an occlusion of the right PCA at the P1-P2 junction with poor distal flow. Atherosclerotic changes of the left posterior cerebral artery with mild-to-moderate stenosis at the P2 segment. Venous sinuses: As permitted by contrast timing, patent. Anatomic variants: Fenestration of the left A1/ACA. Review of the MIP images confirms the above findings IMPRESSION: 1. Positive for large vessel occlusion at the right P1-P2/PCA junction. 2. Intracranial atherosclerotic disease with moderate stenosis at a left M3/MCA superior division branch, mild stenosis at a right M3/MCA inferior division branch, moderate stenosis at the right A3/ACA segment, moderate stenosis at the left vertebral artery and mild-to-moderate stenosis at a left P2/PCA. 3. No significant atherosclerotic disease in the neck. These results were called by telephone at the time of interpretation on 09/06/2020 at 5:19 pm to Dr. Erlinda Hong, who verbally acknowledged these results. Electronically Signed   By: Pedro Earls M.D.   On: 09/06/2020 17:42   CT Angio Neck W and/or Wo Contrast  Result Date: 09/06/2020 CLINICAL DATA:  Right-sided weakness, left-sided vision impairment. EXAM: CT ANGIOGRAPHY  HEAD AND NECK TECHNIQUE: Multidetector CT imaging of the head and neck was performed using the standard protocol during bolus administration of intravenous contrast. Multiplanar CT image reconstructions and MIPs were obtained to evaluate the vascular anatomy. Carotid stenosis measurements (when applicable) are obtained utilizing NASCET criteria, using the distal internal carotid diameter as the denominator. CONTRAST:  48mL OMNIPAQUE IOHEXOL 350 MG/ML SOLN COMPARISON:  CT angiogram of the head and neck November 23, 2018. FINDINGS: CTA NECK FINDINGS Aortic arch: Common origin of the innominate and left common carotid artery from the aortic arch. Imaged portion shows no evidence of aneurysm or dissection. No significant stenosis of the major arch vessel origins. Right carotid system: Increased tortuosity of the cervical segment of the right ICA. No significant stenosis. Left carotid system: Mild atherosclerotic changes of the left carotid bifurcation without hemodynamically significant stenosis. Increased tortuosity of the cervical segment of the left ICA. Vertebral arteries: Codominant. No evidence of dissection, stenosis (50% or greater) or occlusion. Skeleton: No acute osseous abnormality. Periapical lucency at the right maxillary first molar tooth. Other neck: Enlarged thyroid gland with multiple hypodense nodules, the largest measuring up to 1.5 cm. Upper chest: Negative. Review of the MIP images confirms the above findings CTA HEAD FINDINGS Anterior circulation: Mild atherosclerotic changes of the bilateral carotid siphon  without stenosis. Luminal irregularity seen along the bilateral MCA vascular trees with focus of moderate stenosis at a left M3/MCA superior division branch and mild stenosis at a right M3/MCA inferior division branch. There is also luminal irregularity in the bilateral ACA vascular trees with focal area of moderate stenosis at the right A3/ACA segment. Fenestration of the left A1/ACA again  noted. Posterior circulation: Atherosclerotic changes of the bilateral intracranial vertebral arteries with moderate stenosis on the left. No significant stenosis on the right. The basilar artery has normal course and caliber. There is an occlusion of the right PCA at the P1-P2 junction with poor distal flow. Atherosclerotic changes of the left posterior cerebral artery with mild-to-moderate stenosis at the P2 segment. Venous sinuses: As permitted by contrast timing, patent. Anatomic variants: Fenestration of the left A1/ACA. Review of the MIP images confirms the above findings IMPRESSION: 1. Positive for large vessel occlusion at the right P1-P2/PCA junction. 2. Intracranial atherosclerotic disease with moderate stenosis at a left M3/MCA superior division branch, mild stenosis at a right M3/MCA inferior division branch, moderate stenosis at the right A3/ACA segment, moderate stenosis at the left vertebral artery and mild-to-moderate stenosis at a left P2/PCA. 3. No significant atherosclerotic disease in the neck. These results were called by telephone at the time of interpretation on 09/06/2020 at 5:19 pm to Dr. Erlinda Hong, who verbally acknowledged these results. Electronically Signed   By: Pedro Earls M.D.   On: 09/06/2020 17:42   MR BRAIN WO CONTRAST  Result Date: 09/07/2020 CLINICAL DATA:  Stroke, follow-up.  Slurred speech. EXAM: MRI HEAD WITHOUT CONTRAST TECHNIQUE: Multiplanar, multiecho pulse sequences of the brain and surrounding structures were obtained without intravenous contrast. COMPARISON:  CT code stroke 09/06/2020. FINDINGS: Brain: Acute right PCA territory infarcts involving the right occipital lobe, parahippocampal/medial right temporal lobe, and splenium of the corpus callosum. Punctate acute infarct in the dorsal lateral. Right thalamus. There is associated edema and regional mass effect without midline shift. Additional left pontine acute infarct. No evidence of hemorrhagic  transformation. Remote left frontal infarct with encephalomalacia and gliosis. Remote lacunar infarcts in bilateral basal ganglia,, corona radiata and thalami. Additional moderate scattered T2/FLAIR hyperintensities within the white matter, most likely related to chronic microvascular ischemic disease. Atrophy. Similar mild ventriculomegaly. Mild crowding of sulci at the vertex. No mass lesion. No midline shift. Basal cisterns are patent. No extra-axial fluid collection. Vascular: See recent CTA for further evaluation. Skull and upper cervical spine: Normal marrow signal. Sinuses/Orbits: Left sphenoid sinus retention cyst. Mild ethmoid air cell mucosal thickening. No acute orbital findings. Other: Moderate bilateral mastoid effusions. IMPRESSION: 1. Acute right PCA territory infarcts, as described above. 2. Acute left pontine infarct. 3. Associated edema and regional mass effect without midline shift. No hemorrhagic transformation. 4. Remote left frontal infarct and remote lacunar infarcts in bilateral basal ganglia,, corona radiata and thalami. Moderate chronic microvascular ischemic disease. 5. Similar mild ventriculomegaly, most likely related to atrophy. Normal pressure hydrocephalus is thought less likely, but is a differential consideration. Electronically Signed   By: Margaretha Sheffield MD   On: 09/07/2020 09:22   ECHOCARDIOGRAM COMPLETE  Result Date: 09/07/2020    ECHOCARDIOGRAM REPORT   Patient Name:   Craig Knight Date of Exam: 09/07/2020 Medical Rec #:  [id number]       Height:       67.0 in Accession #:    [id number]      Weight:       230.0 lb Date of  Birth:  [birth year]       BSA:          2.146 m Patient Age:    41 years        BP:           130/92 mmHg Patient Gender: M               HR:           62 bpm. Exam Location:  [facility] Procedure: 2D Echo, Cardiac Doppler and Color Doppler Indications:    Stroke  History:        Patient has prior history of Echocardiogram examinations, most                  recent 03/17/2017. CHF, Stroke and COPD, Signs/Symptoms:Shortness                 of Breath and Chest Pain; Risk Factors:Hypertension, Diabetes                 and Current Smoker.  Sonographer:    Wenda Low Referring Phys: [place]  1. Left ventricular ejection fraction, by estimation, is 60 to 65%. The left ventricle has normal function. The left ventricle has no regional wall motion abnormalities. There is severe left ventricular hypertrophy. Left ventricular diastolic parameters  are consistent with Grade I diastolic dysfunction (impaired relaxation).  2. Right ventricular systolic function is normal. The right ventricular size is normal. Tricuspid regurgitation signal is inadequate for assessing PA pressure.  3. The mitral valve is normal in structure. No evidence of mitral valve regurgitation. No evidence of mitral stenosis.  4. The aortic valve has an indeterminant number of cusps. Aortic valve regurgitation is not visualized. No aortic stenosis is present. FINDINGS  Left Ventricle: Left ventricular ejection fraction, by estimation, is 60 to 65%. The left ventricle has normal function. The left ventricle has no regional wall motion abnormalities. The left ventricular internal cavity size was normal in size. There is  severe left ventricular hypertrophy. Left ventricular diastolic parameters are consistent with Grade I diastolic dysfunction (impaired relaxation). Normal left ventricular filling pressure. Right Ventricle: The right ventricular size is normal. No increase in right ventricular wall thickness. Right ventricular systolic function is normal. Tricuspid regurgitation signal is inadequate for assessing PA pressure. Left Atrium: Left atrial size was normal in size. Right Atrium: Right atrial size was normal in size. Pericardium: There is no evidence of pericardial effusion. Mitral Valve: The mitral valve is normal in structure. No evidence of mitral valve  regurgitation. No evidence of mitral valve stenosis. MV peak gradient, 3.7 mmHg. The mean mitral valve gradient is 1.0 mmHg. Tricuspid Valve: The tricuspid valve is normal in structure. Tricuspid valve regurgitation is not demonstrated. No evidence of tricuspid stenosis. Aortic Valve: The aortic valve has an indeterminant number of cusps. Aortic valve regurgitation is not visualized. No aortic stenosis is present. Aortic valve mean gradient measures 4.0 mmHg. Aortic valve peak gradient measures 6.9 mmHg. Aortic valve area, by VTI measures 2.38 cm. Pulmonic Valve: The pulmonic valve was not well visualized. Pulmonic valve regurgitation is not visualized. No evidence of pulmonic stenosis. Aorta: The aortic root is normal in size and structure. Venous: The inferior vena cava was not well visualized. IAS/Shunts: No atrial level shunt detected by color flow Doppler.  LEFT VENTRICLE PLAX 2D LVIDd:         4.50 cm  Diastology LVIDs:  2.80 cm  LV e' medial:    6.05 cm/s LV PW:         1.93 cm  LV E/e' medial:  9.9 LV IVS:        1.94 cm  LV e' lateral:   4.88 cm/s LVOT diam:     2.00 cm  LV E/e' lateral: 12.3 LV SV:         68 LV SV Index:   32 LVOT Area:     3.14 cm  LEFT ATRIUM             Index       RIGHT ATRIUM           Index LA diam:        4.60 cm 2.14 cm/m  RA Area:     15.50 cm LA Vol (A2C):   28.4 ml 13.23 ml/m RA Volume:   39.20 ml  18.27 ml/m LA Vol (A4C):   48.8 ml 22.74 ml/m LA Biplane Vol: 36.9 ml 17.19 ml/m  AORTIC VALVE AV Area (Vmax):    3.09 cm AV Area (Vmean):   3.16 cm AV Area (VTI):     2.38 cm AV Vmax:           131.00 cm/s AV Vmean:          88.600 cm/s AV VTI:            0.285 m AV Peak Grad:      6.9 mmHg AV Mean Grad:      4.0 mmHg LVOT Vmax:         129.00 cm/s LVOT Vmean:        89.200 cm/s LVOT VTI:          0.216 m LVOT/AV VTI ratio: 0.76  AORTA Ao Root diam: 3.85 cm Ao Asc diam:  3.40 cm MITRAL VALVE MV Area (PHT): 2.76 cm    SHUNTS MV Area VTI:   1.90 cm    Systemic VTI:   0.22 m MV Peak grad:  3.7 mmHg    Systemic Diam: 2.00 cm MV Mean grad:  1.0 mmHg MV Vmax:       0.96 m/s MV Vmean:      45.1 cm/s MV Decel Time: 275 msec MV E velocity: 59.90 cm/s MV A velocity: 86.70 cm/s MV E/A ratio:  0.69 Carlyle Dolly MD Electronically signed by Carlyle Dolly MD Signature Date/Time: 09/07/2020/2:40:42 PM    Final    CT HEAD CODE STROKE WO CONTRAST  Result Date: 09/06/2020 CLINICAL DATA:  Code stroke.  Neuro deficit, acute stroke suspected. EXAM: CT HEAD WITHOUT CONTRAST TECHNIQUE: Contiguous axial images were obtained from the base of the skull through the vertex without intravenous contrast. COMPARISON:  CT head 11/22/2018. FINDINGS: Brain: Acute right PCA territory infarcts, including the right occipital lobe, right mesial temporal lobe and potentially the right thalamus and right aspect of the splenium of the corpus callosum. Associated edema and local mass effect. No midline shift. Mild hyperdensity in the right occipital lobe. Remote infarcts in the left frontal lobe with encephalomalacia. Remote infarcts in bilateral corona radiata and basal ganglia. Additional moderate patchy white matter hypoattenuation, most likely chronic microvascular ischemic disease. Similar atrophy. Similar mild ventriculomegaly, mildly disproportionate to the degree of sulcal enlargement. Additionally there is similar crowding of sulci at the vertex and a mildly acute callosal angle. Vascular: Subtle hyperdensity within the right PCA (series 5, image 36; series 6, image 30), potentially thrombus. Intracranial atherosclerosis. Skull: No  acute fracture. Sinuses/Orbits: Sinuses are largely clear.  Unremarkable orbits. Other: No mastoid effusions. IMPRESSION: 1. Acute right PCA territory infarcts, including the right occipital lobe, right mesial/parahippocampal temporal lobe and potentially the right thalamus and right aspect of the splenium of the corpus callosum. Associated edema and local mass effect  without midline shift. 2. Mild hyperdensity in the right occipital lobe may represent spared cortex and/or petechial hemorrhage and warrants attention on follow-up. 3. Subtle hyperdensity within the right PCA (series 5, image 36; series 6, image 30), concerning for thrombus given the above findings. Recommend CTA. 4. Remote infarcts in the left frontal lobe and remote lacunar infarcts in bilateral corona radiata and basal ganglia. 5. Moderate chronic microvascular ischemic disease. 6. Similar mild ventriculomegaly, mildly disproportionate to the degree of sulcal enlargement. Findings could be secondary to central predominant volume loss versus normal pressure hydrocephalus. Code stroke imaging results were communicated on 09/06/2020 at 4:46 pm to provider Dr. Laverta Baltimore via telephone, who verbally acknowledged these results. Electronically Signed   By: Margaretha Sheffield MD   On: 09/06/2020 16:59     PHYSICAL EXAM  Temp:  [98.2 F (36.8 C)-98.4 F (36.9 C)] 98.4 F (36.9 C) (07/14 1948) Pulse Rate:  [51-151] 65 (07/14 1948) Resp:  [11-22] 17 (07/14 1948) BP: (107-180)/(50-114) 180/100 (07/14 1948) SpO2:  [92 %-100 %] 100 % (07/14 1948)  General - Well nourished, well developed, in no apparent distress.  Ophthalmologic - fundi not visualized due to noncooperation.  Cardiovascular - Regular rhythm and rate.  Mental Status -  Level of arousal and orientation to time, place, and person were intact. Language including expression, naming, repetition, comprehension was assessed and found intact. Fund of Knowledge was assessed and was intact.  Cranial Nerves II - XII - II - left hemianopia III, IV, VI - Extraocular movements intact. V - Facial sensation intact bilaterally. VII - right mild facial droop VIII - Hearing & vestibular intact bilaterally. X - Palate elevates symmetrically. XI - Chin turning & shoulder shrug intact bilaterally. XII - Tongue protrusion intact.  Motor Strength - The  patient's strength was normal in left upper and bilateral lower extremities, however, right upper extremity pronator drift.  Bulk was normal and fasciculations were absent.   Motor Tone - Muscle tone was assessed at the neck and appendages and was normal.  Reflexes - The patient's reflexes were symmetrical in all extremities and he had no pathological reflexes.  Sensory - Light touch, temperature/pinprick were assessed and were symmetrical.    Coordination - The patient had normal movements in the hands and feet with no ataxia or dysmetria.  Tremor was absent.  Gait and Station - deferred.   ASSESSMENT/PLAN Craig Knight is a 55 y.o. male with history of CHF, COPD, hypertension, borderline diabetes presented to ED for right-sided weakness, slurred speech and right facial droop. No tPA given due to subacute stroke.    Stroke:  right PCA large infarct with left pontine infarct, etiology not quite clear, could be large/small vessel disease or cardioembolic source CT subacute right PCA stroke CTA head and neck right P1/P2 occlusion, bilateral M3 mild to moderate stenosis.  Right A3 moderate stenosis, left VA and left P2 moderate stenosis MRI right PCA large infarct with left pontine infarct 2D Echo EF 60 to 65% Recommend TEE and loop recorder for further embolic work-up.  If patient remains here over the weekend, will do as inpatient, otherwise can do as outpatient. LDL 108 HgbA1c 7.6 SCDs for  VTE prophylaxis aspirin 81 mg daily and clopidogrel 75 mg daily prior to admission, now on aspirin 81 mg daily and Brilinta (ticagrelor) 90 mg bid for 30 days and then back to home aspirin 81 and Plavix 75. Patient counseled to be compliant with his antithrombotic medications Ongoing aggressive stroke risk factor management Therapy recommendations: Pending Disposition: Pending  CHF Myoview 2018 showed EF 38% However TTE showed EF normal range Has been following with cardiology, on  Imdur Recommend TEE and loop recorder for further work-up as above  Diabetes HgbA1c 7.6 goal < 7.0 Uncontrolled CBG monitoring SSI DM education and close PCP follow up  Hypertension Stable on the high end Permissive hypertension (OK if <220/120) for 24-48 hours post stroke and then gradually normalized within 5-7 days. Long term BP goal normotensive  Hyperlipidemia Home meds: Lipitor 40 LDL 108, goal < 70 Now on Lipitor 80 Continue statin at discharge  CKD Creatinine 1.8-2.4 BMP in a.m.  Tobacco abuse Current smoker Smoking cessation counseling provided Pt is willing to quit  Other Stroke Risk Factors   Other Active Problems Leukocytosis WBC 13.0  Hospital day # 1   Rosalin Hawking, MD PhD Stroke Neurology 09/07/2020 8:12 PM    To contact Stroke Continuity provider, please refer to [email or web address] After hours, contact General Neurology

## 2020-09-07 NOTE — ED Notes (Signed)
Patient asleep.

## 2020-09-07 NOTE — ED Notes (Signed)
Patient remains forgetful

## 2020-09-07 NOTE — Progress Notes (Signed)
PROGRESS NOTE    Craig Knight  CLE:[id number] DOB: 06-16-1965 DOA: 09/06/2020 PCP: Rosita Fire, MD   Brief Narrative:   Craig Knight is a 55 y.o. male with medical history significant for hypertension, diabetes, CHF, CKD 3, COPD. Patient was brought to the ED spouse reports of right-sided weakness and speech change.  Patient was noted to have acute right PCA territory infarct on head CT.  Neurology has recommended transfer to [facility] for further evaluation and holding antiplatelet agent at this time due to questionable hemorrhagic transformation.  Assessment & Plan:   Principal Problem:   Acute CVA (cerebrovascular accident) ([place]) Active Problems:   Hypertension   DM type 2 (diabetes mellitus, type 2) (HCC)   Chronic diastolic CHF (congestive heart failure) (HCC)   CKD (chronic kidney disease), stage III (HCC)   Chronic obstructive pulmonary disease (HCC)   Acute right PCA territory CVA -With associated edema and concern for questionable hemorrhagic transformation -Transfer to [facility] for neurology evaluation as recommended -Brain MRI and 2D echocardiogram pending -Hemoglobin A1c pending -Lipid panel with LDL 108 -Continue statin -UDS pending -Permissive hypertension -Passed bedside swallow evaluation and diet started -PT/OT/SLP evaluation pending -Avoid antiplatelet agents per neurology recommendations for now  Elevated blood pressure -Allowing for permissive hypertension -Holding home oral hydralazine, losartan, chlorthalidone, and Imdur -As needed hydralazine ordered  Type 2 diabetes -A1c pending -Continue SSI -Holding home metformin  AKI on CKD stage IIIa -Continues on IV fluid, time-limited -Holding nephrotoxic agents -Follow strict I's and O's -Recheck labs in a.m.  Leukocytosis -Possible stress reaction -UA pending  COPD -Currently stable without bronchospasms -Inhalers as needed  Obesity -Lifestyle changes outpatient  DVT  prophylaxis: SCDs Code Status: Full Family Communication: Spouse at bedside on admission Disposition Plan:  Status is: Inpatient  Remains inpatient appropriate because:Ongoing diagnostic testing needed not appropriate for outpatient work up and Inpatient level of care appropriate due to severity of illness  Dispo: The patient is from: Home              Anticipated d/c is to: SNF              Patient currently is not medically stable to d/c.   Difficult to place patient No   Consultants:  TeleNeurology Dr. Erlinda Hong  Procedures:  See below  Antimicrobials:  none   Subjective: Patient seen and evaluated today with no new acute complaints or concerns. No acute concerns or events noted overnight.  He is sleeping this morning.  Objective: Vitals:   09/07/20 0400 09/07/20 0500 09/07/20 0559 09/07/20 0600  BP: 112/78 133/82  131/85  Pulse: (!) 55 65  (!) 54  Resp: 11 19  14   Temp:      TempSrc:      SpO2: 95% 98% 97%    No intake or output data in the 24 hours ending 09/07/20 0715 There were no vitals filed for this visit.  Examination:  General exam: Appears calm and comfortable  Respiratory system: Clear to auscultation. Respiratory effort normal. Cardiovascular system: S1 & S2 heard, RRR.  Gastrointestinal system: Abdomen is soft Central nervous system: Alert and awake Extremities: No edema Skin: No significant lesions noted Psychiatry: Flat affect.    Data Reviewed: I have personally reviewed following labs and imaging studies  CBC: Recent Labs  Lab 09/06/20 1630 09/06/20 1637  WBC 13.0*  --   NEUTROABS 9.2*  --   HGB 15.3 16.0  HCT 45.9 47.0  MCV 81.0  --  PLT 271  --    Basic Metabolic Panel: Recent Labs  Lab 09/06/20 1630 09/06/20 1637  NA 137 139  K 4.0 4.1  CL 101 103  CO2 26  --   GLUCOSE 148* 149*  BUN 38* 36*  CREATININE 2.41* 2.40*  CALCIUM 9.6  --    GFR: Estimated Creatinine Clearance: 40 mL/min (A) (by C-G formula based on SCr of  2.4 mg/dL (H)). Liver Function Tests: Recent Labs  Lab 09/06/20 1630  AST 16  ALT 20  ALKPHOS 97  BILITOT 0.3  PROT 7.2  ALBUMIN 4.3   No results for input(s): LIPASE, AMYLASE in the last 168 hours. No results for input(s): AMMONIA in the last 168 hours. Coagulation Profile: Recent Labs  Lab 09/06/20 1630  INR 0.9   Cardiac Enzymes: No results for input(s): CKTOTAL, CKMB, CKMBINDEX, TROPONINI in the last 168 hours. BNP (last 3 results) No results for input(s): PROBNP in the last 8760 hours. HbA1C: No results for input(s): HGBA1C in the last 72 hours. CBG: Recent Labs  Lab 09/05/20 1310 09/06/20 1624 09/07/20 0002  GLUCAP 143* 155* 142*   Lipid Profile: Recent Labs    09/07/20 0426  CHOL 163  HDL 30*  LDLCALC 108*  TRIG 124  CHOLHDL 5.4   Thyroid Function Tests: No results for input(s): TSH, T4TOTAL, FREET4, T3FREE, THYROIDAB in the last 72 hours. Anemia Panel: No results for input(s): VITAMINB12, FOLATE, FERRITIN, TIBC, IRON, RETICCTPCT in the last 72 hours. Sepsis Labs: No results for input(s): PROCALCITON, LATICACIDVEN in the last 168 hours.  Recent Results (from the past 240 hour(s))  SARS CORONAVIRUS 2 (TAT 6-24 HRS) Nasopharyngeal Nasopharyngeal Swab     Status: None   Collection Time: 09/01/20  9:28 AM   Specimen: Nasopharyngeal Swab  Result Value Ref Range Status   SARS Coronavirus 2 NEGATIVE NEGATIVE Final    Comment: (NOTE) SARS-CoV-2 target nucleic acids are NOT DETECTED.  The SARS-CoV-2 RNA is generally detectable in upper and lower respiratory specimens during the acute phase of infection. Negative results do not preclude SARS-CoV-2 infection, do not rule out co-infections with other pathogens, and should not be used as the sole basis for treatment or other patient management decisions. Negative results must be combined with clinical observations, patient history, and epidemiological information. The expected result is Negative.  Fact  Sheet for Patients: SugarRoll.be  Fact Sheet for Healthcare Providers: https://www.woods-mathews.com/  This test is not yet approved or cleared by the [place] FDA and  has been authorized for detection and/or diagnosis of SARS-CoV-2 by FDA under an Emergency Use Authorization (EUA). This EUA will remain  in effect (meaning this test can be used) for the duration of the COVID-19 declaration under Se ction 564(b)(1) of the Act, 21 U.S.C. section 360bbb-3(b)(1), unless the authorization is terminated or revoked sooner.  Performed at [facility], [place] [address], [place], [place] [address]   Resp Panel by RT-PCR (Flu A&B, Covid) Nasopharyngeal Swab     Status: None   Collection Time: 09/06/20  4:30 PM   Specimen: Nasopharyngeal Swab; Nasopharyngeal(NP) swabs in vial transport medium  Result Value Ref Range Status   SARS Coronavirus 2 by RT PCR NEGATIVE NEGATIVE Final    Comment: (NOTE) SARS-CoV-2 target nucleic acids are NOT DETECTED.  The SARS-CoV-2 RNA is generally detectable in upper respiratory specimens during the acute phase of infection. The lowest concentration of SARS-CoV-2 viral copies this assay can detect is 138 copies/mL. A negative result does not  preclude SARS-Cov-2 infection and should not be used as the sole basis for treatment or other patient management decisions. A negative result may occur with  improper specimen collection/handling, submission of specimen other than nasopharyngeal swab, presence of viral mutation(s) within the areas targeted by this assay, and inadequate number of viral copies(<138 copies/mL). A negative result must be combined with clinical observations, patient history, and epidemiological information. The expected result is Negative.  Fact Sheet for Patients:  EntrepreneurPulse.com.au  Fact Sheet for Healthcare Providers:   IncredibleEmployment.be  This test is no t yet approved or cleared by the [place] FDA and  has been authorized for detection and/or diagnosis of SARS-CoV-2 by FDA under an Emergency Use Authorization (EUA). This EUA will remain  in effect (meaning this test can be used) for the duration of the COVID-19 declaration under Section 564(b)(1) of the Act, 21 U.S.C.section 360bbb-3(b)(1), unless the authorization is terminated  or revoked sooner.       Influenza A by PCR NEGATIVE NEGATIVE Final   Influenza B by PCR NEGATIVE NEGATIVE Final    Comment: (NOTE) The Xpert Xpress SARS-CoV-2/FLU/RSV plus assay is intended as an aid in the diagnosis of influenza from Nasopharyngeal swab specimens and should not be used as a sole basis for treatment. Nasal washings and aspirates are unacceptable for Xpert Xpress SARS-CoV-2/FLU/RSV testing.  Fact Sheet for Patients: EntrepreneurPulse.com.au  Fact Sheet for Healthcare Providers: IncredibleEmployment.be  This test is not yet approved or cleared by the [place] FDA and has been authorized for detection and/or diagnosis of SARS-CoV-2 by FDA under an Emergency Use Authorization (EUA). This EUA will remain in effect (meaning this test can be used) for the duration of the COVID-19 declaration under Section 564(b)(1) of the Act, 21 U.S.C. section 360bbb-3(b)(1), unless the authorization is terminated or revoked.  Performed at [facility], [address], [place], [place] [address]          Radiology Studies: CT Angio Head W or Wo Contrast  Result Date: 09/06/2020 CLINICAL DATA:  Right-sided weakness, left-sided vision impairment. EXAM: CT ANGIOGRAPHY HEAD AND NECK TECHNIQUE: Multidetector CT imaging of the head and neck was performed using the standard protocol during bolus administration of intravenous contrast. Multiplanar CT image reconstructions and MIPs were obtained to  evaluate the vascular anatomy. Carotid stenosis measurements (when applicable) are obtained utilizing NASCET criteria, using the distal internal carotid diameter as the denominator. CONTRAST:  24mL OMNIPAQUE IOHEXOL 350 MG/ML SOLN COMPARISON:  CT angiogram of the head and neck November 23, 2018. FINDINGS: CTA NECK FINDINGS Aortic arch: Common origin of the innominate and left common carotid artery from the aortic arch. Imaged portion shows no evidence of aneurysm or dissection. No significant stenosis of the major arch vessel origins. Right carotid system: Increased tortuosity of the cervical segment of the right ICA. No significant stenosis. Left carotid system: Mild atherosclerotic changes of the left carotid bifurcation without hemodynamically significant stenosis. Increased tortuosity of the cervical segment of the left ICA. Vertebral arteries: Codominant. No evidence of dissection, stenosis (50% or greater) or occlusion. Skeleton: No acute osseous abnormality. Periapical lucency at the right maxillary first molar tooth. Other neck: Enlarged thyroid gland with multiple hypodense nodules, the largest measuring up to 1.5 cm. Upper chest: Negative. Review of the MIP images confirms the above findings CTA HEAD FINDINGS Anterior circulation: Mild atherosclerotic changes of the bilateral carotid siphon without stenosis. Luminal irregularity seen along the bilateral MCA vascular trees with focus of moderate stenosis at a left M3/MCA  superior division branch and mild stenosis at a right M3/MCA inferior division branch. There is also luminal irregularity in the bilateral ACA vascular trees with focal area of moderate stenosis at the right A3/ACA segment. Fenestration of the left A1/ACA again noted. Posterior circulation: Atherosclerotic changes of the bilateral intracranial vertebral arteries with moderate stenosis on the left. No significant stenosis on the right. The basilar artery has normal course and caliber. There  is an occlusion of the right PCA at the P1-P2 junction with poor distal flow. Atherosclerotic changes of the left posterior cerebral artery with mild-to-moderate stenosis at the P2 segment. Venous sinuses: As permitted by contrast timing, patent. Anatomic variants: Fenestration of the left A1/ACA. Review of the MIP images confirms the above findings IMPRESSION: 1. Positive for large vessel occlusion at the right P1-P2/PCA junction. 2. Intracranial atherosclerotic disease with moderate stenosis at a left M3/MCA superior division branch, mild stenosis at a right M3/MCA inferior division branch, moderate stenosis at the right A3/ACA segment, moderate stenosis at the left vertebral artery and mild-to-moderate stenosis at a left P2/PCA. 3. No significant atherosclerotic disease in the neck. These results were called by telephone at the time of interpretation on 09/06/2020 at 5:19 pm to Dr. Erlinda Hong, who verbally acknowledged these results. Electronically Signed   By: Pedro Earls M.D.   On: 09/06/2020 17:42   CT Angio Neck W and/or Wo Contrast  Result Date: 09/06/2020 CLINICAL DATA:  Right-sided weakness, left-sided vision impairment. EXAM: CT ANGIOGRAPHY HEAD AND NECK TECHNIQUE: Multidetector CT imaging of the head and neck was performed using the standard protocol during bolus administration of intravenous contrast. Multiplanar CT image reconstructions and MIPs were obtained to evaluate the vascular anatomy. Carotid stenosis measurements (when applicable) are obtained utilizing NASCET criteria, using the distal internal carotid diameter as the denominator. CONTRAST:  65mL OMNIPAQUE IOHEXOL 350 MG/ML SOLN COMPARISON:  CT angiogram of the head and neck November 23, 2018. FINDINGS: CTA NECK FINDINGS Aortic arch: Common origin of the innominate and left common carotid artery from the aortic arch. Imaged portion shows no evidence of aneurysm or dissection. No significant stenosis of the major arch vessel  origins. Right carotid system: Increased tortuosity of the cervical segment of the right ICA. No significant stenosis. Left carotid system: Mild atherosclerotic changes of the left carotid bifurcation without hemodynamically significant stenosis. Increased tortuosity of the cervical segment of the left ICA. Vertebral arteries: Codominant. No evidence of dissection, stenosis (50% or greater) or occlusion. Skeleton: No acute osseous abnormality. Periapical lucency at the right maxillary first molar tooth. Other neck: Enlarged thyroid gland with multiple hypodense nodules, the largest measuring up to 1.5 cm. Upper chest: Negative. Review of the MIP images confirms the above findings CTA HEAD FINDINGS Anterior circulation: Mild atherosclerotic changes of the bilateral carotid siphon without stenosis. Luminal irregularity seen along the bilateral MCA vascular trees with focus of moderate stenosis at a left M3/MCA superior division branch and mild stenosis at a right M3/MCA inferior division branch. There is also luminal irregularity in the bilateral ACA vascular trees with focal area of moderate stenosis at the right A3/ACA segment. Fenestration of the left A1/ACA again noted. Posterior circulation: Atherosclerotic changes of the bilateral intracranial vertebral arteries with moderate stenosis on the left. No significant stenosis on the right. The basilar artery has normal course and caliber. There is an occlusion of the right PCA at the P1-P2 junction with poor distal flow. Atherosclerotic changes of the left posterior cerebral artery with mild-to-moderate stenosis at  the P2 segment. Venous sinuses: As permitted by contrast timing, patent. Anatomic variants: Fenestration of the left A1/ACA. Review of the MIP images confirms the above findings IMPRESSION: 1. Positive for large vessel occlusion at the right P1-P2/PCA junction. 2. Intracranial atherosclerotic disease with moderate stenosis at a left M3/MCA superior  division branch, mild stenosis at a right M3/MCA inferior division branch, moderate stenosis at the right A3/ACA segment, moderate stenosis at the left vertebral artery and mild-to-moderate stenosis at a left P2/PCA. 3. No significant atherosclerotic disease in the neck. These results were called by telephone at the time of interpretation on 09/06/2020 at 5:19 pm to Dr. Erlinda Hong, who verbally acknowledged these results. Electronically Signed   By: Pedro Earls M.D.   On: 09/06/2020 17:42   CT HEAD CODE STROKE WO CONTRAST  Result Date: 09/06/2020 CLINICAL DATA:  Code stroke.  Neuro deficit, acute stroke suspected. EXAM: CT HEAD WITHOUT CONTRAST TECHNIQUE: Contiguous axial images were obtained from the base of the skull through the vertex without intravenous contrast. COMPARISON:  CT head 11/22/2018. FINDINGS: Brain: Acute right PCA territory infarcts, including the right occipital lobe, right mesial temporal lobe and potentially the right thalamus and right aspect of the splenium of the corpus callosum. Associated edema and local mass effect. No midline shift. Mild hyperdensity in the right occipital lobe. Remote infarcts in the left frontal lobe with encephalomalacia. Remote infarcts in bilateral corona radiata and basal ganglia. Additional moderate patchy white matter hypoattenuation, most likely chronic microvascular ischemic disease. Similar atrophy. Similar mild ventriculomegaly, mildly disproportionate to the degree of sulcal enlargement. Additionally there is similar crowding of sulci at the vertex and a mildly acute callosal angle. Vascular: Subtle hyperdensity within the right PCA (series 5, image 36; series 6, image 30), potentially thrombus. Intracranial atherosclerosis. Skull: No acute fracture. Sinuses/Orbits: Sinuses are largely clear.  Unremarkable orbits. Other: No mastoid effusions. IMPRESSION: 1. Acute right PCA territory infarcts, including the right occipital lobe, right  mesial/parahippocampal temporal lobe and potentially the right thalamus and right aspect of the splenium of the corpus callosum. Associated edema and local mass effect without midline shift. 2. Mild hyperdensity in the right occipital lobe may represent spared cortex and/or petechial hemorrhage and warrants attention on follow-up. 3. Subtle hyperdensity within the right PCA (series 5, image 36; series 6, image 30), concerning for thrombus given the above findings. Recommend CTA. 4. Remote infarcts in the left frontal lobe and remote lacunar infarcts in bilateral corona radiata and basal ganglia. 5. Moderate chronic microvascular ischemic disease. 6. Similar mild ventriculomegaly, mildly disproportionate to the degree of sulcal enlargement. Findings could be secondary to central predominant volume loss versus normal pressure hydrocephalus. Code stroke imaging results were communicated on 09/06/2020 at 4:46 pm to provider Dr. Laverta Baltimore via telephone, who verbally acknowledged these results. Electronically Signed   By: Margaretha Sheffield MD   On: 09/06/2020 16:59        Scheduled Meds:   stroke: mapping our early stages of recovery book   Does not apply Once   atorvastatin  40 mg Oral Daily   fluticasone furoate-vilanterol  1 puff Inhalation Daily   gabapentin  300 mg Oral QHS   insulin aspart  0-5 Units Subcutaneous QHS   insulin aspart  0-9 Units Subcutaneous TID WC   LORazepam  0.5 mg Intravenous Once   umeclidinium bromide  1 puff Inhalation Daily   Continuous Infusions:  sodium chloride 100 mL/hr at 09/07/20 0004     LOS: 1 day  Time spent: 35 minutes    Deshun Sedivy Darleen Crocker, DO [facility]  If 7PM-7AM, please contact night-coverage [email or web address] 09/07/2020, 7:15 AM

## 2020-09-07 NOTE — H&P (Signed)
Patient seen in pre-op.holding. Discussed plan developed at o/p visit for smoking cessation at least 4w prior to surgery. Patient has not been adherent to the plan and is currently still smoking, although he does endorse that he is smoking less. Counseled again on cessation and will defer surgery at this point. Patient made aware. Offered to contact PCP to provide additional assistance in smoking cessation efforts. Patient agreeable. Called PCP office and discussed with PA, as MD Fanta not in the office.   Jesusita Oka, MD General and [facility]

## 2020-09-07 NOTE — Plan of Care (Signed)
Pt still at [facility], has not been transferred to [facility] yet. MRI done showed right PCA and left pontine infarct, explaining his left hemianopia and right facial and limb weakness saw yesterday, no significant hemorrhagic conversion seen on MRI. LDL 108 and A1C 7.6. echo done but result pending. He is on ASA and plavix PTA and now will change to ASA and brilinta for 30 days and then back to ASA and plavix. Increase lipitor 40 to 80. Pending PT/OT evaluation.   Rosalin Hawking, MD PhD Stroke Neurology 09/07/2020 2:17 PM

## 2020-09-07 NOTE — ED Notes (Signed)
Patient denies pain and is resting comfortably.  

## 2020-09-07 NOTE — Plan of Care (Signed)
  Problem: Education: Goal: Knowledge of disease or condition will improve Outcome: Progressing Goal: Knowledge of secondary prevention will improve Outcome: Progressing Goal: Knowledge of patient specific risk factors addressed and post discharge goals established will improve Outcome: Progressing Goal: Individualized Educational Video(s) Outcome: Progressing   Problem: Health Behavior/Discharge Planning: Goal: Ability to manage health-related needs will improve Outcome: Progressing   Problem: Intracerebral Hemorrhage Tissue Perfusion: Goal: Complications of Intracerebral Hemorrhage will be minimized Outcome: Progressing   Problem: Ischemic Stroke/TIA Tissue Perfusion: Goal: Complications of ischemic stroke/TIA will be minimized Outcome: Progressing   Problem: Spontaneous Subarachnoid Hemorrhage Tissue Perfusion: Goal: Complications of Spontaneous Subarachnoid Hemorrhage will be minimized Outcome: Progressing   Problem: Nutrition: Goal: Risk of aspiration will decrease Outcome: Progressing Goal: Dietary intake will improve Outcome: Progressing

## 2020-09-08 LAB — BASIC METABOLIC PANEL
Anion gap: 10 (ref 5–15)
BUN: 25 mg/dL — ABNORMAL HIGH (ref 6–20)
CO2: 22 mmol/L (ref 22–32)
Calcium: 9.6 mg/dL (ref 8.9–10.3)
Chloride: 103 mmol/L (ref 98–111)
Creatinine, Ser: 1.47 mg/dL — ABNORMAL HIGH (ref 0.61–1.24)
GFR, Estimated: 56 mL/min — ABNORMAL LOW (ref >60)
Glucose, Bld: 113 mg/dL — ABNORMAL HIGH (ref 70–99)
Potassium: 3.5 mmol/L (ref 3.5–5.1)
Sodium: 135 mmol/L (ref 135–145)

## 2020-09-08 LAB — GLUCOSE, CAPILLARY: Glucose-Capillary: 189 mg/dL — ABNORMAL HIGH (ref 70–99)

## 2020-09-08 LAB — CBC
HCT: 45.8 % (ref 39.0–52.0)
Hemoglobin: 15.5 g/dL (ref 13.0–17.0)
MCH: 26.6 pg (ref 26.0–34.0)
MCHC: 33.8 g/dL (ref 30.0–36.0)
MCV: 78.6 fL — ABNORMAL LOW (ref 80.0–100.0)
Platelets: 222 10*3/uL (ref 150–400)
RBC: 5.83 MIL/uL — ABNORMAL HIGH (ref 4.22–5.81)
RDW: 13.5 % (ref 11.5–15.5)
WBC: 11.4 10*3/uL — ABNORMAL HIGH (ref 4.0–10.5)
nRBC: 0 % (ref 0.0–0.2)

## 2020-09-08 LAB — MAGNESIUM: Magnesium: 1.9 mg/dL (ref 1.7–2.4)

## 2020-09-08 MED ORDER — GLIPIZIDE 5 MG PO TABS: 5.0000 mg | ORAL_TABLET | Freq: Every day | ORAL | 1 refills | Status: DC

## 2020-09-08 MED ORDER — METFORMIN HCL 500 MG PO TABS: 500.0000 mg | ORAL_TABLET | Freq: Two times a day (BID) | ORAL | 0 refills | Status: DC

## 2020-09-08 MED ORDER — CLOPIDOGREL BISULFATE 75 MG PO TABS: 75.0000 mg | ORAL_TABLET | Freq: Every day | ORAL | Status: DC

## 2020-09-08 MED ORDER — LOSARTAN POTASSIUM 100 MG PO TABS: 100.0000 mg | ORAL_TABLET | Freq: Every day | ORAL | Status: DC

## 2020-09-08 MED ORDER — TICAGRELOR 90 MG PO TABS: 90.0000 mg | ORAL_TABLET | Freq: Two times a day (BID) | ORAL | 0 refills | Status: DC

## 2020-09-08 MED ORDER — ATORVASTATIN CALCIUM 80 MG PO TABS: 80.0000 mg | ORAL_TABLET | Freq: Every day | ORAL | 1 refills | Status: DC

## 2020-09-08 MED ORDER — CHLORTHALIDONE 25 MG PO TABS: 25.0000 mg | ORAL_TABLET | Freq: Every day | ORAL | 0 refills | Status: DC

## 2020-09-08 NOTE — Plan of Care (Signed)
Pt was d/c'ed before seen today. But discussed with Dr. Cyndia Skeeters, pt will need outpt TEE and loop recorder. He has follow up with Dr. Debara Pickett. Will also follow up with [facility] in 4 weeks.   Rosalin Hawking, MD PhD Stroke Neurology 09/08/2020 6:23 PM

## 2020-09-08 NOTE — Evaluation (Signed)
Physical Therapy Evaluation Patient Details Name: Craig Knight MRN: [id number] DOB: 1966-02-12 Today's Date: 09/08/2020   History of Present Illness  55yo male admitted 09/06/20 with R weakness and speech changes. 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 No tpa given. Per neuro notes, possibilty of 2 CVAs- one in R PCA territory and one in L subcortical territory. PMH COPD, DM, HTN, CVA  Clinical Impression   Patient received in recliner, pleasant and cooperative with session. BLE strength and sensation symmetrical, able to perform RAM without difficulty. Has definite vision impairment L eye and unable to correctly identify number of fingers with R eye covered, but does report he has been having some vision issues in his left eye for some time now. Difficult to tease out acute vision changes due to this. Able to mobilize on a min guard basis with no device and cues for navigation in hallway. Left up in recliner with all needs met, chair alarm active and spouse present. Would benefit from skilled OP PT f/u in neuro setting.     Follow Up Recommendations Outpatient PT;Supervision/Assistance - 24 hour;Other (comment) (neuro OP PT)    Equipment Recommendations  None recommended by PT    Recommendations for Other Services       Precautions / Restrictions Precautions Precautions: Fall;Other (comment) Precaution Comments: L eye visual impairment Restrictions Weight Bearing Restrictions: No      Mobility  Bed Mobility               General bed mobility comments: up in recliner    Transfers Overall transfer level: Needs assistance Equipment used: None Transfers: Sit to/from Stand Sit to Stand: Min guard         General transfer comment: min guard for safety, no physical assist given  Ambulation/Gait Ambulation/Gait assistance: Min guard Gait Distance (Feet): 150 Feet Assistive device: None Gait Pattern/deviations:  Step-through pattern;Decreased weight shift to right;Trendelenburg;Drifts right/left;Wide base of support;Trunk flexed Gait velocity: decreased   General Gait Details: able to perform step through gait pattern, initially started off WNL but R LE does fatigue easily and as a result had reduced weight shift to R and incoordination seemed to also increase in R LE as fatigue increased. Mod cues for navigation in hallway.  Stairs            Wheelchair Mobility    Modified Rankin (Stroke Patients Only)       Balance Overall balance assessment: Mild deficits observed, not formally tested                                           Pertinent Vitals/Pain Pain Assessment: No/denies pain    Home Living Family/patient expects to be discharged to:: Private residence Living Arrangements: Spouse/significant other;Children Available Help at Discharge: Family;Available 24 hours/day Type of Home: House Home Access: Stairs to enter Entrance Stairs-Rails: None Entrance Stairs-Number of Steps: 2 no rails Home Layout: One level Home Equipment: None      Prior Function Level of Independence: Independent               Hand Dominance        Extremity/Trunk Assessment   Upper Extremity Assessment Upper Extremity Assessment: Defer to OT evaluation    Lower Extremity Assessment Lower Extremity Assessment: Overall WFL for tasks assessed  Cervical / Trunk Assessment Cervical / Trunk Assessment: Normal  Communication   Communication: No difficulties  Cognition Arousal/Alertness: Awake/alert Behavior During Therapy: WFL for tasks assessed/performed Overall Cognitive Status: Within Functional Limits for tasks assessed                                 General Comments: possible STM memory deficits and reduced awareness of deficits- asked me multiple times what my name is during session      General Comments General comments (skin integrity,  edema, etc.): VSS on RA    Exercises     Assessment/Plan    PT Assessment Patient needs continued PT services  PT Problem List Decreased activity tolerance;Decreased safety awareness;Decreased balance;Decreased mobility;Decreased coordination       PT Treatment Interventions DME instruction;Balance training;Gait training;Stair training;Functional mobility training;Patient/family education;Cognitive remediation;Therapeutic activities;Therapeutic exercise    PT Goals (Current goals can be found in the Care Plan section)  Acute Rehab PT Goals Patient Stated Goal: go home today PT Goal Formulation: With patient Time For Goal Achievement: 09/22/20 Potential to Achieve Goals: Good    Frequency Min 3X/week   Barriers to discharge        Co-evaluation               AM-PAC PT "6 Clicks" Mobility  Outcome Measure Help needed turning from your back to your side while in a flat bed without using bedrails?: None Help needed moving from lying on your back to sitting on the side of a flat bed without using bedrails?: None Help needed moving to and from a bed to a chair (including a wheelchair)?: None Help needed standing up from a chair using your arms (e.g., wheelchair or bedside chair)?: None Help needed to walk in hospital room?: A Little Help needed climbing 3-5 steps with a railing? : A Little 6 Click Score: 22    End of Session Equipment Utilized During Treatment: Gait belt Activity Tolerance: Patient tolerated treatment well Patient left: in chair;with call bell/phone within reach;with chair alarm set;with family/visitor present Nurse Communication: Mobility status PT Visit Diagnosis: Other symptoms and signs involving the nervous system (R29.898);Unsteadiness on feet (R26.81)    Time: 5830-9407 PT Time Calculation (min) (ACUTE ONLY): 24 min   Charges:   PT Evaluation $PT Eval Moderate Complexity: 1 Mod PT Treatments $Gait Training: 8-22 mins       Windell Norfolk, DPT, PN1   Supplemental Physical Therapist [place]    Pager [phone] Acute Rehab Office [phone]

## 2020-09-08 NOTE — Discharge Summary (Signed)
Physician Discharge Summary  Craig Knight ERX:[id number] DOB: 04-10-1965 DOA: 09/06/2020  PCP: Rosita Fire, MD  Admit date: 09/06/2020 Discharge date: 09/08/2020  Admitted From: Home Disposition: Home  Recommendations for Outpatient Follow-up:  Follow ups as below. Please obtain CBC/BMP/Mag at follow up Please follow up on the following pending results: None  Home Health: Outpatient rehab Equipment/Devices: None  Discharge Condition: Stable CODE STATUS: Full code   Follow-up Information     [place]. Schedule an appointment as soon as possible for a visit.   Specialty: Rehabilitation Why: Please call the neurorehabilitation office to schedule for outpatient PT and OT. Contact information: [address] [place]        Rosita Fire, MD. Schedule an appointment as soon as possible for a visit in 1 week(s).   Specialty: Internal Medicine Contact information: [place] [address] [phone]         Pixie Casino, MD. Schedule an appointment as soon as possible for a visit in 2 week(s).   Specialty: Cardiology Contact information: [place] [address] [phone]                 Hospital Course: 55 year old M with PMH of HTN, DM-2, diastolic CHF, ALP-3X, COPD and tobacco use disorder presented to [facility] with right-sided weakness changes in speech.  CT head revealed acute right PCA territory infarct.  Neurology consulted.  Patient was transferred to [facility] for further evaluation and treatment.  MRI brain confirmed right PCA territories infarct and also showed left pontine infarct.  CTA head and neck with large vessel occlusion at right P1-P2/P3 junction, and other mild to moderate intracranial stenosis.  See details below for more.  TTE with LVEF of 60 to 65% and G1 DD but no  other significant finding.  UDS negative.  LDL 108.  A1c 7.5%.  Neurology recommended TEE and loop recorder for further embolic work-up, which will be arranged by cardiology outpatient.  He is discharged on aspirin and Brilinta for 1 month, followed by aspirin and Plavix per neurology recommendation.  Lipitor increased to 80 mg daily.  He was counseled on the importance of smoking cessation and lifestyle change.  Ambulatory referral to outpatient rehab ordered as recommended by therapy.   See individual problem list below for more on hospital course.   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  CTA head and neck and TTE as above. -Aspirin and Brilinta for 1 months followed by aspirin and Plavix -Increase Lipitor from 40 to 80 mg daily -Cardiology consulted and will arrange TEE and loop recorder as recommended by neurology -Outpatient follow-up with PCP and neurology. -Ambulatory referral to PT/OT ordered.   Essential hypertension: BP elevated -Patient to resume hydralazine and Imdur on discharge -Plan to resume losartan and chlorthalidone in 3 to 4 days   Uncontrolled NIDDM-2 with hyperglycemia: A1c 7.6%. Recent Labs  Lab 09/07/20 0912 09/07/20 1124 09/07/20 1840 09/07/20 2120 09/08/20 0629  GLUCAP 137* 158* 116* 118* 189*  -Continue metformin 500 mg twice daily -Added glipizide 5 mg twice daily -Started as above.   AKI on CKD stage IIIA: Improved. Recent Labs    08/24/20 1037 09/06/20 1630 09/06/20 1637 09/08/20 0335  BUN 19 38* 36* 25*  CREATININE 1.81* 2.41* 2.40* 1.47*  -Patient to continue holding chlorthalidone  and losartan for 3 to 4 days -Recheck renal function in 1 week   Leukocytosis: Likely demargination.  Improved.   Chronic COPD: No acute exacerbation. -Continue home breathing treatments   Morbid obesity: BMI 36. -Encourage lifestyle  change to lose weight  Tobacco use disorder -Encouraged smoking cessation.          Discharge Exam: Vitals:   09/08/20 0440 09/08/20 0732  BP: (!) 175/107 (!) 175/102  Pulse: 84 63  Resp: 18 20  Temp: (!) 97.4 F (36.3 C) 98.2 F (36.8 C)  SpO2: 97% 99%    GENERAL: No apparent distress.  Nontoxic. HEENT: MMM.  Vision and hearing grossly intact.  NECK: Supple.  No apparent JVD.  RESP: On RA.  No IWOB.  Fair aeration bilaterally. CVS:  RRR. Heart sounds normal.  ABD/GI/GU: Bowel sounds present. Soft. Non tender.  MSK/EXT:  Moves extremities. No apparent deformity. No edema.  SKIN: no apparent skin lesion or wound NEURO: Awake, alert and oriented appropriately. Speech clear. Cranial nerves II-XII neurology  intact. Motor 5/5 in all muscle groups of UE and LE bilaterally, Normal tone. Light sensation intact in all dermatomes of upper and lower ext bilaterally. Patellar reflex symmetric.  No pronator drift.  Finger to nose intact. PSYCH: Calm. Normal affect.   Discharge Instructions  Discharge Instructions     Ambulatory referral to Occupational Therapy   Complete by: As directed    Ambulatory referral to Physical Therapy   Complete by: As directed    Diet - low sodium heart healthy   Complete by: As directed    Diet Carb Modified   Complete by: As directed    Discharge instructions   Complete by: As directed    It has been a pleasure taking care of you!  You were hospitalized with right-sided weakness and changes in your speech due to stroke.  Your symptoms resolved.  We have adjusted your medications to reduce your risk of stroke in the future.  Please review your new medication list and the directions on your medications before you take them.  In addition to taking your medications, will recommend lifestyle change including daily exercise and healthy meals. See below for more on this.  It is also important that you quit smoking cigarettes.  Please follow-up with your  primary care doctor in 1 to 2 weeks.  Our cardiologist will call you to arrange outpatient follow-up for further evaluation of your heart as an outpatient.  Our neurologist will call you to arrange outpatient follow-up in 4 to 6 weeks    Portion Size  Choose healthier foods such as 100% whole grains, vegetables, fruits, beans, nut seeds, olive oil, most vegetable oils, fat-free dietary, wild game and fish.  Avoid sweet tea, other sweetened beverages, soda, fruit juice, cold cereal and milk and trans fat.  Eat at least 3 meals and 1-2 snacks per day.  Aim for no more than 5 hours between eating.  Eat breakfast within one hour of getting up.   Exercise at least 150 minutes per week, including weight resistance exercises 3 or 4 times per week.  Try to lose at least 7-10% of your current body weight.  Limit your salt (Sodium) intake to less than 2 gm (2000 mg) a day if you have conditions such as  elevated blood pressure, heart failure...   You may also read about DASH and/or Mediterranean diet at the following web site if you have blood pressure or heart condition. IdentityList.se LACodes.co.za  It is important that you quit smoking cigarettes.  You may use nicotine patch to help you quit smoking.  Nicotine patch is available over-the-counter.  You may also discuss other options to help you quit smoking with your primary care doctor. You can also talk to professional counselors at 1-800-QUIT-NOW 917-386-0041) for free smoking cessation counseling.     Take care,   Increase activity slowly   Complete by: As directed       Allergies as of 09/08/2020       Reactions   Dust Mite Extract    Sneezing   Lisinopril Cough        Medication List     TAKE these medications    albuterol 108 (90 Base) MCG/ACT inhaler Commonly  known as: VENTOLIN HFA Inhale 2 puffs into the lungs every 6 (six) hours as needed for wheezing or shortness of breath.   aspirin EC 81 MG tablet Take 1 tablet (81 mg total) by mouth daily.   atorvastatin 80 MG tablet Commonly known as: Lipitor Take 1 tablet (80 mg total) by mouth daily. What changed:  medication strength how much to take   chlorthalidone 25 MG tablet Commonly known as: HYGROTON Take 1 tablet (25 mg total) by mouth daily. Start taking on: September 13, 2020 What changed: These instructions start on September 13, 2020. If you are unsure what to do until then, ask your doctor or other care provider.   clopidogrel 75 MG tablet Commonly known as: PLAVIX Take 1 tablet (75 mg total) by mouth daily. Start taking on: October 09, 2020 What changed: These instructions start on October 09, 2020. If you are unsure what to do until then, ask your doctor or other care provider.   gabapentin 300 MG capsule Commonly known as: NEURONTIN Take 300 mg by mouth at bedtime.   glipiZIDE 5 MG tablet Commonly known as: Glucotrol Take 1 tablet (5 mg total) by mouth daily.   hydrALAZINE 50 MG tablet Commonly known as: APRESOLINE Take 50 mg by mouth 2 (two) times daily.   ipratropium-albuterol 0.5-2.5 (3) MG/3ML Soln Commonly known as: DUONEB Take 3 mLs by nebulization every 4 (four) hours as needed. What changed: reasons to take this   isosorbide mononitrate 60 MG 24 hr tablet Commonly known as: IMDUR Take 1 tablet (60 mg total) by mouth daily.   losartan 100 MG tablet Commonly known as: COZAAR Take 1 tablet (100 mg total) by mouth daily. Start taking on: September 12, 2020 What changed: These instructions start on September 12, 2020. If you are unsure what to do until then, ask your doctor or other care provider.   metFORMIN 500 MG tablet Commonly known as: GLUCOPHAGE Take 1 tablet (500 mg total) by mouth 2 (two) times daily with a meal.   nitroGLYCERIN 0.4 MG SL tablet Commonly known as:  NITROSTAT Place 1 tablet (0.4 mg total) under the tongue every 5 (five) minutes as needed for chest pain.   NON FORMULARY Pt uses cpap nightly   ticagrelor 90 MG Tabs tablet Commonly known as: BRILINTA Take 1 tablet (90 mg total) by mouth 2 (two) times daily. Start Plavix when you complete this for one month   Trelegy Ellipta 100-62.5-25 MCG/INH Aepb Generic drug: Fluticasone-Umeclidin-Vilant Inhale 1 puff into the lungs daily. What changed: Another medication with the same name was removed. Continue taking this medication, and follow the directions you see here.        Consultations: Neurology Cardiology  Procedures/Studies:   CT  Angio Head W or Wo Contrast  Result Date: 09/06/2020 CLINICAL DATA:  Right-sided weakness, left-sided vision impairment. EXAM: CT ANGIOGRAPHY HEAD AND NECK TECHNIQUE: Multidetector CT imaging of the head and neck was performed using the standard protocol during bolus administration of intravenous contrast. Multiplanar CT image reconstructions and MIPs were obtained to evaluate the vascular anatomy. Carotid stenosis measurements (when applicable) are obtained utilizing NASCET criteria, using the distal internal carotid diameter as the denominator. CONTRAST:  47mL OMNIPAQUE IOHEXOL 350 MG/ML SOLN COMPARISON:  CT angiogram of the head and neck November 23, 2018. FINDINGS: CTA NECK FINDINGS Aortic arch: Common origin of the innominate and left common carotid artery from the aortic arch. Imaged portion shows no evidence of aneurysm or dissection. No significant stenosis of the major arch vessel origins. Right carotid system: Increased tortuosity of the cervical segment of the right ICA. No significant stenosis. Left carotid system: Mild atherosclerotic changes of the left carotid bifurcation without hemodynamically significant stenosis. Increased tortuosity of the cervical segment of the left ICA. Vertebral arteries: Codominant. No evidence of dissection, stenosis  (50% or greater) or occlusion. Skeleton: No acute osseous abnormality. Periapical lucency at the right maxillary first molar tooth. Other neck: Enlarged thyroid gland with multiple hypodense nodules, the largest measuring up to 1.5 cm. Upper chest: Negative. Review of the MIP images confirms the above findings CTA HEAD FINDINGS Anterior circulation: Mild atherosclerotic changes of the bilateral carotid siphon without stenosis. Luminal irregularity seen along the bilateral MCA vascular trees with focus of moderate stenosis at a left M3/MCA superior division branch and mild stenosis at a right M3/MCA inferior division branch. There is also luminal irregularity in the bilateral ACA vascular trees with focal area of moderate stenosis at the right A3/ACA segment. Fenestration of the left A1/ACA again noted. Posterior circulation: Atherosclerotic changes of the bilateral intracranial vertebral arteries with moderate stenosis on the left. No significant stenosis on the right. The basilar artery has normal course and caliber. There is an occlusion of the right PCA at the P1-P2 junction with poor distal flow. Atherosclerotic changes of the left posterior cerebral artery with mild-to-moderate stenosis at the P2 segment. Venous sinuses: As permitted by contrast timing, patent. Anatomic variants: Fenestration of the left A1/ACA. Review of the MIP images confirms the above findings IMPRESSION: 1. Positive for large vessel occlusion at the right P1-P2/PCA junction. 2. Intracranial atherosclerotic disease with moderate stenosis at a left M3/MCA superior division branch, mild stenosis at a right M3/MCA inferior division branch, moderate stenosis at the right A3/ACA segment, moderate stenosis at the left vertebral artery and mild-to-moderate stenosis at a left P2/PCA. 3. No significant atherosclerotic disease in the neck. These results were called by telephone at the time of interpretation on 09/06/2020 at 5:19 pm to Dr. Erlinda Hong, who  verbally acknowledged these results. Electronically Signed   By: Pedro Earls M.D.   On: 09/06/2020 17:42   CT Angio Neck W and/or Wo Contrast  Result Date: 09/06/2020 CLINICAL DATA:  Right-sided weakness, left-sided vision impairment. EXAM: CT ANGIOGRAPHY HEAD AND NECK TECHNIQUE: Multidetector CT imaging of the head and neck was performed using the standard protocol during bolus administration of intravenous contrast. Multiplanar CT image reconstructions and MIPs were obtained to evaluate the vascular anatomy. Carotid stenosis measurements (when applicable) are obtained utilizing NASCET criteria, using the distal internal carotid diameter as the denominator. CONTRAST:  105mL OMNIPAQUE IOHEXOL 350 MG/ML SOLN COMPARISON:  CT angiogram of the head and neck November 23, 2018. FINDINGS: CTA NECK  FINDINGS Aortic arch: Common origin of the innominate and left common carotid artery from the aortic arch. Imaged portion shows no evidence of aneurysm or dissection. No significant stenosis of the major arch vessel origins. Right carotid system: Increased tortuosity of the cervical segment of the right ICA. No significant stenosis. Left carotid system: Mild atherosclerotic changes of the left carotid bifurcation without hemodynamically significant stenosis. Increased tortuosity of the cervical segment of the left ICA. Vertebral arteries: Codominant. No evidence of dissection, stenosis (50% or greater) or occlusion. Skeleton: No acute osseous abnormality. Periapical lucency at the right maxillary first molar tooth. Other neck: Enlarged thyroid gland with multiple hypodense nodules, the largest measuring up to 1.5 cm. Upper chest: Negative. Review of the MIP images confirms the above findings CTA HEAD FINDINGS Anterior circulation: Mild atherosclerotic changes of the bilateral carotid siphon without stenosis. Luminal irregularity seen along the bilateral MCA vascular trees with focus of moderate stenosis at a  left M3/MCA superior division branch and mild stenosis at a right M3/MCA inferior division branch. There is also luminal irregularity in the bilateral ACA vascular trees with focal area of moderate stenosis at the right A3/ACA segment. Fenestration of the left A1/ACA again noted. Posterior circulation: Atherosclerotic changes of the bilateral intracranial vertebral arteries with moderate stenosis on the left. No significant stenosis on the right. The basilar artery has normal course and caliber. There is an occlusion of the right PCA at the P1-P2 junction with poor distal flow. Atherosclerotic changes of the left posterior cerebral artery with mild-to-moderate stenosis at the P2 segment. Venous sinuses: As permitted by contrast timing, patent. Anatomic variants: Fenestration of the left A1/ACA. Review of the MIP images confirms the above findings IMPRESSION: 1. Positive for large vessel occlusion at the right P1-P2/PCA junction. 2. Intracranial atherosclerotic disease with moderate stenosis at a left M3/MCA superior division branch, mild stenosis at a right M3/MCA inferior division branch, moderate stenosis at the right A3/ACA segment, moderate stenosis at the left vertebral artery and mild-to-moderate stenosis at a left P2/PCA. 3. No significant atherosclerotic disease in the neck. These results were called by telephone at the time of interpretation on 09/06/2020 at 5:19 pm to Dr. Erlinda Hong, who verbally acknowledged these results. Electronically Signed   By: Pedro Earls M.D.   On: 09/06/2020 17:42   MR BRAIN WO CONTRAST  Result Date: 09/07/2020 CLINICAL DATA:  Stroke, follow-up.  Slurred speech. EXAM: MRI HEAD WITHOUT CONTRAST TECHNIQUE: Multiplanar, multiecho pulse sequences of the brain and surrounding structures were obtained without intravenous contrast. COMPARISON:  CT code stroke 09/06/2020. FINDINGS: Brain: Acute right PCA territory infarcts involving the right occipital lobe,  parahippocampal/medial right temporal lobe, and splenium of the corpus callosum. Punctate acute infarct in the dorsal lateral. Right thalamus. There is associated edema and regional mass effect without midline shift. Additional left pontine acute infarct. No evidence of hemorrhagic transformation. Remote left frontal infarct with encephalomalacia and gliosis. Remote lacunar infarcts in bilateral basal ganglia,, corona radiata and thalami. Additional moderate scattered T2/FLAIR hyperintensities within the white matter, most likely related to chronic microvascular ischemic disease. Atrophy. Similar mild ventriculomegaly. Mild crowding of sulci at the vertex. No mass lesion. No midline shift. Basal cisterns are patent. No extra-axial fluid collection. Vascular: See recent CTA for further evaluation. Skull and upper cervical spine: Normal marrow signal. Sinuses/Orbits: Left sphenoid sinus retention cyst. Mild ethmoid air cell mucosal thickening. No acute orbital findings. Other: Moderate bilateral mastoid effusions. IMPRESSION: 1. Acute right PCA territory infarcts, as described  above. 2. Acute left pontine infarct. 3. Associated edema and regional mass effect without midline shift. No hemorrhagic transformation. 4. Remote left frontal infarct and remote lacunar infarcts in bilateral basal ganglia,, corona radiata and thalami. Moderate chronic microvascular ischemic disease. 5. Similar mild ventriculomegaly, most likely related to atrophy. Normal pressure hydrocephalus is thought less likely, but is a differential consideration. Electronically Signed   By: Margaretha Sheffield MD   On: 09/07/2020 09:22   ECHOCARDIOGRAM COMPLETE  Result Date: 09/07/2020    ECHOCARDIOGRAM REPORT   Patient Name:   AADIL SUR Date of Exam: 09/07/2020 Medical Rec #:  [id number]       Height:       67.0 in Accession #:    [id number]      Weight:       230.0 lb Date of Birth:  1965-07-01       BSA:          2.146 m Patient Age:    25 years         BP:           130/92 mmHg Patient Gender: M               HR:           62 bpm. Exam Location:  [facility] Procedure: 2D Echo, Cardiac Doppler and Color Doppler Indications:    Stroke  History:        Patient has prior history of Echocardiogram examinations, most                 recent 03/17/2017. CHF, Stroke and COPD, Signs/Symptoms:Shortness                 of Breath and Chest Pain; Risk Factors:Hypertension, Diabetes                 and Current Smoker.  Sonographer:    Wenda Low Referring Phys: [place]  1. Left ventricular ejection fraction, by estimation, is 60 to 65%. The left ventricle has normal function. The left ventricle has no regional wall motion abnormalities. There is severe left ventricular hypertrophy. Left ventricular diastolic parameters  are consistent with Grade I diastolic dysfunction (impaired relaxation).  2. Right ventricular systolic function is normal. The right ventricular size is normal. Tricuspid regurgitation signal is inadequate for assessing PA pressure.  3. The mitral valve is normal in structure. No evidence of mitral valve regurgitation. No evidence of mitral stenosis.  4. The aortic valve has an indeterminant number of cusps. Aortic valve regurgitation is not visualized. No aortic stenosis is present. FINDINGS  Left Ventricle: Left ventricular ejection fraction, by estimation, is 60 to 65%. The left ventricle has normal function. The left ventricle has no regional wall motion abnormalities. The left ventricular internal cavity size was normal in size. There is  severe left ventricular hypertrophy. Left ventricular diastolic parameters are consistent with Grade I diastolic dysfunction (impaired relaxation). Normal left ventricular filling pressure. Right Ventricle: The right ventricular size is normal. No increase in right ventricular wall thickness. Right ventricular systolic function is normal. Tricuspid regurgitation signal is  inadequate for assessing PA pressure. Left Atrium: Left atrial size was normal in size. Right Atrium: Right atrial size was normal in size. Pericardium: There is no evidence of pericardial effusion. Mitral Valve: The mitral valve is normal in structure. No evidence of mitral valve regurgitation. No evidence of mitral valve stenosis. MV peak gradient, 3.7 mmHg. The mean mitral  valve gradient is 1.0 mmHg. Tricuspid Valve: The tricuspid valve is normal in structure. Tricuspid valve regurgitation is not demonstrated. No evidence of tricuspid stenosis. Aortic Valve: The aortic valve has an indeterminant number of cusps. Aortic valve regurgitation is not visualized. No aortic stenosis is present. Aortic valve mean gradient measures 4.0 mmHg. Aortic valve peak gradient measures 6.9 mmHg. Aortic valve area, by VTI measures 2.38 cm. Pulmonic Valve: The pulmonic valve was not well visualized. Pulmonic valve regurgitation is not visualized. No evidence of pulmonic stenosis. Aorta: The aortic root is normal in size and structure. Venous: The inferior vena cava was not well visualized. IAS/Shunts: No atrial level shunt detected by color flow Doppler.  LEFT VENTRICLE PLAX 2D LVIDd:         4.50 cm  Diastology LVIDs:         2.80 cm  LV e' medial:    6.05 cm/s LV PW:         1.93 cm  LV E/e' medial:  9.9 LV IVS:        1.94 cm  LV e' lateral:   4.88 cm/s LVOT diam:     2.00 cm  LV E/e' lateral: 12.3 LV SV:         68 LV SV Index:   32 LVOT Area:     3.14 cm  LEFT ATRIUM             Index       RIGHT ATRIUM           Index LA diam:        4.60 cm 2.14 cm/m  RA Area:     15.50 cm LA Vol (A2C):   28.4 ml 13.23 ml/m RA Volume:   39.20 ml  18.27 ml/m LA Vol (A4C):   48.8 ml 22.74 ml/m LA Biplane Vol: 36.9 ml 17.19 ml/m  AORTIC VALVE AV Area (Vmax):    3.09 cm AV Area (Vmean):   3.16 cm AV Area (VTI):     2.38 cm AV Vmax:           131.00 cm/s AV Vmean:          88.600 cm/s AV VTI:            0.285 m AV Peak Grad:      6.9  mmHg AV Mean Grad:      4.0 mmHg LVOT Vmax:         129.00 cm/s LVOT Vmean:        89.200 cm/s LVOT VTI:          0.216 m LVOT/AV VTI ratio: 0.76  AORTA Ao Root diam: 3.85 cm Ao Asc diam:  3.40 cm MITRAL VALVE MV Area (PHT): 2.76 cm    SHUNTS MV Area VTI:   1.90 cm    Systemic VTI:  0.22 m MV Peak grad:  3.7 mmHg    Systemic Diam: 2.00 cm MV Mean grad:  1.0 mmHg MV Vmax:       0.96 m/s MV Vmean:      45.1 cm/s MV Decel Time: 275 msec MV E velocity: 59.90 cm/s MV A velocity: 86.70 cm/s MV E/A ratio:  0.69 Carlyle Dolly MD Electronically signed by Carlyle Dolly MD Signature Date/Time: 09/07/2020/2:40:42 PM    Final    CT HEAD CODE STROKE WO CONTRAST  Result Date: 09/06/2020 CLINICAL DATA:  Code stroke.  Neuro deficit, acute stroke suspected. EXAM: CT HEAD WITHOUT CONTRAST TECHNIQUE: Contiguous axial images were  obtained from the base of the skull through the vertex without intravenous contrast. COMPARISON:  CT head 11/22/2018. FINDINGS: Brain: Acute right PCA territory infarcts, including the right occipital lobe, right mesial temporal lobe and potentially the right thalamus and right aspect of the splenium of the corpus callosum. Associated edema and local mass effect. No midline shift. Mild hyperdensity in the right occipital lobe. Remote infarcts in the left frontal lobe with encephalomalacia. Remote infarcts in bilateral corona radiata and basal ganglia. Additional moderate patchy white matter hypoattenuation, most likely chronic microvascular ischemic disease. Similar atrophy. Similar mild ventriculomegaly, mildly disproportionate to the degree of sulcal enlargement. Additionally there is similar crowding of sulci at the vertex and a mildly acute callosal angle. Vascular: Subtle hyperdensity within the right PCA (series 5, image 36; series 6, image 30), potentially thrombus. Intracranial atherosclerosis. Skull: No acute fracture. Sinuses/Orbits: Sinuses are largely clear.  Unremarkable orbits. Other: No  mastoid effusions. IMPRESSION: 1. Acute right PCA territory infarcts, including the right occipital lobe, right mesial/parahippocampal temporal lobe and potentially the right thalamus and right aspect of the splenium of the corpus callosum. Associated edema and local mass effect without midline shift. 2. Mild hyperdensity in the right occipital lobe may represent spared cortex and/or petechial hemorrhage and warrants attention on follow-up. 3. Subtle hyperdensity within the right PCA (series 5, image 36; series 6, image 30), concerning for thrombus given the above findings. Recommend CTA. 4. Remote infarcts in the left frontal lobe and remote lacunar infarcts in bilateral corona radiata and basal ganglia. 5. Moderate chronic microvascular ischemic disease. 6. Similar mild ventriculomegaly, mildly disproportionate to the degree of sulcal enlargement. Findings could be secondary to central predominant volume loss versus normal pressure hydrocephalus. Code stroke imaging results were communicated on 09/06/2020 at 4:46 pm to provider Dr. Laverta Baltimore via telephone, who verbally acknowledged these results. Electronically Signed   By: Margaretha Sheffield MD   On: 09/06/2020 16:59       The results of significant diagnostics from this hospitalization (including imaging, microbiology, ancillary and laboratory) are listed below for reference.     Microbiology: Recent Results (from the past 240 hour(s))  SARS CORONAVIRUS 2 (TAT 6-24 HRS) Nasopharyngeal Nasopharyngeal Swab     Status: None   Collection Time: 09/01/20  9:28 AM   Specimen: Nasopharyngeal Swab  Result Value Ref Range Status   SARS Coronavirus 2 NEGATIVE NEGATIVE Final    Comment: (NOTE) SARS-CoV-2 target nucleic acids are NOT DETECTED.  The SARS-CoV-2 RNA is generally detectable in upper and lower respiratory specimens during the acute phase of infection. Negative results do not preclude SARS-CoV-2 infection, do not rule out co-infections with other  pathogens, and should not be used as the sole basis for treatment or other patient management decisions. Negative results must be combined with clinical observations, patient history, and epidemiological information. The expected result is Negative.  Fact Sheet for Patients: SugarRoll.be  Fact Sheet for Healthcare Providers: https://www.woods-mathews.com/  This test is not yet approved or cleared by the [place] FDA and  has been authorized for detection and/or diagnosis of SARS-CoV-2 by FDA under an Emergency Use Authorization (EUA). This EUA will remain  in effect (meaning this test can be used) for the duration of the COVID-19 declaration under Se ction 564(b)(1) of the Act, 21 U.S.C. section 360bbb-3(b)(1), unless the authorization is terminated or revoked sooner.  Performed at [facility], [place] [address], [place], [place] [address]   Resp Panel by RT-PCR (Flu A&B, Covid) Nasopharyngeal  Swab     Status: None   Collection Time: 09/06/20  4:30 PM   Specimen: Nasopharyngeal Swab; Nasopharyngeal(NP) swabs in vial transport medium  Result Value Ref Range Status   SARS Coronavirus 2 by RT PCR NEGATIVE NEGATIVE Final    Comment: (NOTE) SARS-CoV-2 target nucleic acids are NOT DETECTED.  The SARS-CoV-2 RNA is generally detectable in upper respiratory specimens during the acute phase of infection. The lowest concentration of SARS-CoV-2 viral copies this assay can detect is 138 copies/mL. A negative result does not preclude SARS-Cov-2 infection and should not be used as the sole basis for treatment or other patient management decisions. A negative result may occur with  improper specimen collection/handling, submission of specimen other than nasopharyngeal swab, presence of viral mutation(s) within the areas targeted by this assay, and inadequate number of viral copies(<138 copies/mL). A negative result must be combined  with clinical observations, patient history, and epidemiological information. The expected result is Negative.  Fact Sheet for Patients:  EntrepreneurPulse.com.au  Fact Sheet for Healthcare Providers:  IncredibleEmployment.be  This test is no t yet approved or cleared by the [place] FDA and  has been authorized for detection and/or diagnosis of SARS-CoV-2 by FDA under an Emergency Use Authorization (EUA). This EUA will remain  in effect (meaning this test can be used) for the duration of the COVID-19 declaration under Section 564(b)(1) of the Act, 21 U.S.C.section 360bbb-3(b)(1), unless the authorization is terminated  or revoked sooner.       Influenza A by PCR NEGATIVE NEGATIVE Final   Influenza B by PCR NEGATIVE NEGATIVE Final    Comment: (NOTE) The Xpert Xpress SARS-CoV-2/FLU/RSV plus assay is intended as an aid in the diagnosis of influenza from Nasopharyngeal swab specimens and should not be used as a sole basis for treatment. Nasal washings and aspirates are unacceptable for Xpert Xpress SARS-CoV-2/FLU/RSV testing.  Fact Sheet for Patients: EntrepreneurPulse.com.au  Fact Sheet for Healthcare Providers: IncredibleEmployment.be  This test is not yet approved or cleared by the [place] FDA and has been authorized for detection and/or diagnosis of SARS-CoV-2 by FDA under an Emergency Use Authorization (EUA). This EUA will remain in effect (meaning this test can be used) for the duration of the COVID-19 declaration under Section 564(b)(1) of the Act, 21 U.S.C. section 360bbb-3(b)(1), unless the authorization is terminated or revoked.  Performed at [facility], [address] [place], [place] [address]      Labs:  CBC: Recent Labs  Lab 09/06/20 1630 09/06/20 1637 09/08/20 0335  WBC 13.0*  --  11.4*  NEUTROABS 9.2*  --   --   HGB 15.3 16.0 15.5  HCT 45.9 47.0 45.8  MCV 81.0  --   78.6*  PLT 271  --  222   BMP &GFR Recent Labs  Lab 09/06/20 1630 09/06/20 1637 09/08/20 0335  NA 137 139 135  K 4.0 4.1 3.5  CL 101 103 103  CO2 26  --  22  GLUCOSE 148* 149* 113*  BUN 38* 36* 25*  CREATININE 2.41* 2.40* 1.47*  CALCIUM 9.6  --  9.6  MG  --   --  1.9   Estimated Creatinine Clearance: 65.4 mL/min (A) (by C-G formula based on SCr of 1.47 mg/dL (H)). Liver & Pancreas: Recent Labs  Lab 09/06/20 1630  AST 16  ALT 20  ALKPHOS 97  BILITOT 0.3  PROT 7.2  ALBUMIN 4.3   No results for input(s): LIPASE, AMYLASE in the last 168 hours. No results  for input(s): AMMONIA in the last 168 hours. Diabetic: Recent Labs    09/07/20 0426  HGBA1C 7.6*   Recent Labs  Lab 09/07/20 0912 09/07/20 1124 09/07/20 1840 09/07/20 2120 09/08/20 0629  GLUCAP 137* 158* 116* 118* 189*   Cardiac Enzymes: No results for input(s): CKTOTAL, CKMB, CKMBINDEX, TROPONINI in the last 168 hours. No results for input(s): PROBNP in the last 8760 hours. Coagulation Profile: Recent Labs  Lab 09/06/20 1630  INR 0.9   Thyroid Function Tests: No results for input(s): TSH, T4TOTAL, FREET4, T3FREE, THYROIDAB in the last 72 hours. Lipid Profile: Recent Labs    09/07/20 0426  CHOL 163  HDL 30*  LDLCALC 108*  TRIG 124  CHOLHDL 5.4   Anemia Panel: No results for input(s): VITAMINB12, FOLATE, FERRITIN, TIBC, IRON, RETICCTPCT in the last 72 hours. Urine analysis:    Component Value Date/Time   COLORURINE YELLOW 09/07/2020 1803   APPEARANCEUR CLEAR 09/07/2020 1803   LABSPEC 1.019 09/07/2020 1803   PHURINE 5.0 09/07/2020 1803   GLUCOSEU NEGATIVE 09/07/2020 1803   HGBUR NEGATIVE 09/07/2020 1803   BILIRUBINUR NEGATIVE 09/07/2020 1803   KETONESUR NEGATIVE 09/07/2020 1803   PROTEINUR NEGATIVE 09/07/2020 1803   UROBILINOGEN 0.2 09/15/2013 1011   NITRITE NEGATIVE 09/07/2020 1803   LEUKOCYTESUR NEGATIVE 09/07/2020 1803   Sepsis Labs: Invalid input(s): PROCALCITONIN,  LACTICIDVEN   Time coordinating discharge: 45 minutes  SIGNED:  Mercy Riding, MD  [facility] 09/08/2020, 11:02 AM  If 7PM-7AM, please contact night-coverage [email or web address]

## 2020-09-08 NOTE — Progress Notes (Signed)
Pt medically stable for discharge per MD order. IV and tele removed. Discharge paperwork reviewed, all questions answered. Belongings: cell phone, Games developer, clothing. Pt leaving for home.

## 2020-09-08 NOTE — Evaluation (Addendum)
Occupational Therapy Evaluation Patient Details Name: Craig Knight MRN: [id number] DOB: [birth year] Today's Date: 09/08/2020    History of Present Illness 55yo male admitted 09/06/20 with R weakness and speech changes. 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 No tpa given. Per neuro notes, possibilty of 2 CVAs- one in R PCA territory and one in L subcortical territory. PMH COPD, DM, HTN, CVA   Clinical Impression   Pt at baseline indep with all ADL's and mobility with wife present to confirm. This date presenting with only mild RUE deficits of weakness and coordination, overall able to complete all functional transfers and ADL tasks with sup or higher level. Min guard provided at times d/t line management and slightly impulsive tendency. Anticipate pt with ?insight to safety, but unsure if this is baseline. Able to tolerate transfers, in room self care mobility and seated/standing ADL's at sup level of performance. Noted deficits in L visual attention and acuity during screen, pt with difficulty describing reporting these are not new since this admission. Would benefit from formal assessment at follow up and consider participation in formal driving screen for safety prior to return to driving. OT completed education on modification of task and safety with completion of ADL's at this time, pt receptive and wife involved in education. At this time OT will sign off as pt with appropriate level of S/A required for safe transition to home and would benefit from OPOT at time of d/c.     Follow Up Recommendations  Outpatient OT    Equipment Recommendations  None recommended by OT    Recommendations for Other Services       Precautions / Restrictions Precautions Precautions: Fall;Other (comment) Precaution Comments: L eye visual impairment Restrictions Weight Bearing Restrictions: No      Mobility Bed Mobility               General bed  mobility comments: in recliner on arrival    Transfers Overall transfer level: Needs assistance Equipment used: None Transfers: Sit to/from Stand Sit to Stand: Min guard         General transfer comment: no physical assist provided, assist and cues for management and safety with line management and negotiation in room    Balance Overall balance assessment: Mild deficits observed, not formally tested                                         ADL either performed or assessed with clinical judgement   ADL Overall ADL's : At baseline                                       General ADL Comments: overall no impairments in compeltion of basic ADL's, increased attention required for involvement of R UE during functional tasks, but no deficits limiting indep. completed with distant supervision but no intervention required.     Vision Baseline Vision/History: No visual deficits       Perception     Praxis      Pertinent Vitals/Pain Pain Assessment: No/denies pain     Hand Dominance Right   Extremity/Trunk Assessment Upper Extremity Assessment Upper Extremity Assessment: Overall WFL for tasks assessed;RUE deficits/detail RUE Deficits / Details: decreased strength of -  4/5 with onset of fatigue. but able to participate with functional self care tasks appropriatel RUE Coordination: decreased gross motor   Lower Extremity Assessment Lower Extremity Assessment: Defer to PT evaluation   Cervical / Trunk Assessment Cervical / Trunk Assessment: Normal   Communication Communication Communication: No difficulties   Cognition Arousal/Alertness: Awake/alert Behavior During Therapy: WFL for tasks assessed/performed Overall Cognitive Status: Within Functional Limits for tasks assessed                                 General Comments: grossly appears appropriate, concerns for safety awareness and insight from this or previous stroke, may  benefit from further investigation at OP therapies of which he is agreeable   General Comments  VSS on RA    Exercises     Shoulder Instructions      Home Living Family/patient expects to be discharged to:: Private residence Living Arrangements: Spouse/significant other;Children Available Help at Discharge: Family;Available 24 hours/day Type of Home: House Home Access: Stairs to enter [employer] of Steps: 2 no rails Entrance Stairs-Rails: None Home Layout: One level     Bathroom Shower/Tub: Walk-in shower;Other (comment) (built in seat)   Bathroom Toilet: Handicapped height     Home Equipment: None          Prior Functioning/Environment Level of Independence: Independent                 OT Problem List: Decreased strength;Decreased coordination      OT Treatment/Interventions:      OT Goals(Current goals can be found in the care plan section) Acute Rehab OT Goals Patient Stated Goal: go home today  OT Frequency:     Barriers to D/C:            Co-evaluation              AM-PAC OT "6 Clicks" Daily Activity     Outcome Measure Help from another person eating meals?: None Help from another person taking care of personal grooming?: None Help from another person toileting, which includes using toliet, bedpan, or urinal?: None Help from another person bathing (including washing, rinsing, drying)?: A Little Help from another person to put on and taking off regular upper body clothing?: None Help from another person to put on and taking off regular lower body clothing?: None 6 Click Score: 23   End of Session Nurse Communication: Mobility status  Activity Tolerance: Patient tolerated treatment well Patient left: in chair;with call bell/phone within reach;with family/visitor present  OT Visit Diagnosis: Unsteadiness on feet (R26.81);Muscle weakness (generalized) (M62.81)                Time: 4132-4401 OT Time Calculation (min): 25  min Charges:  OT General Charges $OT Visit: 1 Visit OT Evaluation $OT Eval Low Complexity: 1 Low  Juwana Thoreson OTR/L acute rehab services Office: [phone] 09/08/2020, 11:20 AM

## 2020-09-08 NOTE — Care Management Important Message (Signed)
Important Message  Patient Details  Name: Craig Knight MRN: [id number] Date of Birth: 1966/02/17   Medicare Important Message Given:  Yes     [address] 09/08/2020, 2:41 PM

## 2020-09-08 NOTE — Plan of Care (Signed)
  Problem: Education: Goal: Knowledge of disease or condition will improve Outcome: Adequate for Discharge Goal: Knowledge of secondary prevention will improve Outcome: Adequate for Discharge Goal: Knowledge of patient specific risk factors addressed and post discharge goals established will improve Outcome: Adequate for Discharge Goal: Individualized Educational Video(s) Outcome: Adequate for Discharge   Problem: Coping: Goal: Will verbalize positive feelings about self Outcome: Adequate for Discharge Goal: Will identify appropriate support needs Outcome: Adequate for Discharge   Problem: Health Behavior/Discharge Planning: Goal: Ability to manage health-related needs will improve Outcome: Adequate for Discharge   Problem: Self-Care: Goal: Ability to participate in self-care as condition permits will improve Outcome: Adequate for Discharge Goal: Verbalization of feelings and concerns over difficulty with self-care will improve Outcome: Adequate for Discharge Goal: Ability to communicate needs accurately will improve Outcome: Adequate for Discharge   Problem: Nutrition: Goal: Risk of aspiration will decrease Outcome: Adequate for Discharge Goal: Dietary intake will improve Outcome: Adequate for Discharge   Problem: Intracerebral Hemorrhage Tissue Perfusion: Goal: Complications of Intracerebral Hemorrhage will be minimized Outcome: Adequate for Discharge   Problem: Ischemic Stroke/TIA Tissue Perfusion: Goal: Complications of ischemic stroke/TIA will be minimized Outcome: Adequate for Discharge   Problem: Spontaneous Subarachnoid Hemorrhage Tissue Perfusion: Goal: Complications of Spontaneous Subarachnoid Hemorrhage will be minimized Outcome: Adequate for Discharge   Problem: Acute Rehab PT Goals(only PT should resolve) Goal: Pt Will Ambulate Outcome: Adequate for Discharge Goal: Pt Will Go Up/Down Stairs Outcome: Adequate for Discharge Goal: Pt/caregiver will Perform  Home Exercise Program Outcome: Adequate for Discharge

## 2020-09-08 NOTE — TOC Transition Note (Signed)
Transition of Care [facility]) - CM/SW Discharge Note   Patient Details  Name: Craig Knight MRN: [id number] Date of Birth: August 27, 1965  [facility]) CM/SW Contact:  Geralynn Ochs, LCSW Phone Number: 09/08/2020, 10:28 AM   Clinical Narrative:   CSW met with patient to discuss outpatient at neurorehab office, patient agreeable. CSW explained to patient to call the office directly to schedule appointments, and placed information on patient's discharge instructions. No other needs identified at this time.    Final next level of care: OP Rehab Barriers to Discharge: Barriers Resolved   Patient Goals and CMS Choice Patient states their goals for this hospitalization and ongoing recovery are:: to get home CMS Medicare.gov Compare Post Acute Care list provided to:: Patient Choice offered to / list presented to : Patient  Discharge Placement                Patient to be transferred to facility by: Family Name of family member notified: Self Patient and family notified of of transfer: 09/08/20  Discharge Plan and Services                                     Social Determinants of Health (SDOH) Interventions     Readmission Risk Interventions No flowsheet data found.

## 2020-09-11 ENCOUNTER — Telehealth: Payer: Self-pay | Admitting: Pulmonary Disease

## 2020-09-11 ENCOUNTER — Other Ambulatory Visit: Payer: Self-pay | Admitting: *Deleted

## 2020-09-11 MED ORDER — ALBUTEROL SULFATE HFA 108 (90 BASE) MCG/ACT IN AERS: 2.0000 | INHALATION_SPRAY | Freq: Four times a day (QID) | RESPIRATORY_TRACT | 8 refills | Status: DC | PRN

## 2020-09-11 MED ORDER — TRELEGY ELLIPTA 100-62.5-25 MCG/INH IN AEPB
1.0000 | INHALATION_SPRAY | Freq: Every day | RESPIRATORY_TRACT | 8 refills | Status: DC
Start: 2020-09-11 — End: 2020-10-12

## 2020-09-11 NOTE — Patient Outreach (Signed)
[facility]) Care Management  09/11/2020  [name] Jun 03, 1965 [id number]   RED ON EMMI ALERT - Stroke Day 1  Date: 7/17 Red Alert Reason: Not scheduled follow up appointment   Outreach attempt #1, successful.  Identity verified.  This care manager introduced self and stated purpose of call.  [facility] care management services explained.    Member report he is doing well, wife also present during assessment.  He has been taking all medications per discharge instructions but state the increase in his Atorvastatin has been making him "feel funny, as if I'm drunk."  He has follow up appointment with PCP tomorrow, will discuss concern at that time.  He has appointment for rehab assessment on 7/21.  Wife report she called cardiology office to schedule, wasn't successful but will try again.  Conference call placed to neurology, voice message left, referral coordinator will call member back directly to schedule appointment.    Denies any other concerns at this time, encouraged to call this care manager with questions.   Plan: RN CM will send successful outreach letter and follow up within the next 2 weeks.  [name], [place], MSN [place] [phone]

## 2020-09-11 NOTE — Telephone Encounter (Signed)
Refills sent into pharmacy. Attempted to contact patient's wife [name], left message for her to return call.  Nothing further needed at this time.

## 2020-09-12 ENCOUNTER — Ambulatory Visit (HOSPITAL_COMMUNITY): Admit: 2020-09-12 | Payer: Medicare Other | Admitting: Cardiology

## 2020-09-12 ENCOUNTER — Encounter (HOSPITAL_COMMUNITY): Payer: Self-pay

## 2020-09-12 SURGERY — ECHOCARDIOGRAM, TRANSESOPHAGEAL
Anesthesia: Monitor Anesthesia Care

## 2020-09-13 ENCOUNTER — Emergency Department (HOSPITAL_COMMUNITY): Payer: Medicare Other

## 2020-09-13 ENCOUNTER — Emergency Department (HOSPITAL_COMMUNITY)
Admission: EM | Admit: 2020-09-13 | Discharge: 2020-09-13 | Disposition: A | Discharge status: Home / Self Care | Payer: Medicare Other | Source: Home / Self Care

## 2020-09-13 DIAGNOSIS — R0602 Shortness of breath: Secondary | ICD-10-CM | POA: Diagnosis not present

## 2020-09-13 DIAGNOSIS — R531 Weakness: Secondary | ICD-10-CM | POA: Insufficient documentation

## 2020-09-13 DIAGNOSIS — Z5321 Procedure and treatment not carried out due to patient leaving prior to being seen by health care provider: Secondary | ICD-10-CM | POA: Insufficient documentation

## 2020-09-13 DIAGNOSIS — I63531 Cerebral infarction due to unspecified occlusion or stenosis of right posterior cerebral artery: Secondary | ICD-10-CM | POA: Diagnosis not present

## 2020-09-13 LAB — CBC WITH DIFFERENTIAL/PLATELET
Abs Immature Granulocytes: 0.04 10*3/uL (ref 0.00–0.07)
Basophils Absolute: 0.1 10*3/uL (ref 0.0–0.1)
Basophils Relative: 0 %
Eosinophils Absolute: 0.1 10*3/uL (ref 0.0–0.5)
Eosinophils Relative: 1 %
HCT: 42.7 % (ref 39.0–52.0)
Hemoglobin: 14 g/dL (ref 13.0–17.0)
Immature Granulocytes: 0 %
Lymphocytes Relative: 12 %
Lymphs Abs: 1.7 10*3/uL (ref 0.7–4.0)
MCH: 26.6 pg (ref 26.0–34.0)
MCHC: 32.8 g/dL (ref 30.0–36.0)
MCV: 81 fL (ref 80.0–100.0)
Monocytes Absolute: 1 10*3/uL (ref 0.1–1.0)
Monocytes Relative: 7 %
Neutro Abs: 11.5 10*3/uL — ABNORMAL HIGH (ref 1.7–7.7)
Neutrophils Relative %: 80 %
Platelets: 258 10*3/uL (ref 150–400)
RBC: 5.27 MIL/uL (ref 4.22–5.81)
RDW: 14.2 % (ref 11.5–15.5)
WBC: 14.4 10*3/uL — ABNORMAL HIGH (ref 4.0–10.5)
nRBC: 0 % (ref 0.0–0.2)

## 2020-09-13 LAB — BASIC METABOLIC PANEL
Anion gap: 12 (ref 5–15)
BUN: 31 mg/dL — ABNORMAL HIGH (ref 6–20)
CO2: 20 mmol/L — ABNORMAL LOW (ref 22–32)
Calcium: 9.4 mg/dL (ref 8.9–10.3)
Chloride: 104 mmol/L (ref 98–111)
Creatinine, Ser: 2.35 mg/dL — ABNORMAL HIGH (ref 0.61–1.24)
GFR, Estimated: 32 mL/min — ABNORMAL LOW (ref >60)
Glucose, Bld: 98 mg/dL (ref 70–99)
Potassium: 3.5 mmol/L (ref 3.5–5.1)
Sodium: 136 mmol/L (ref 135–145)

## 2020-09-13 IMAGING — CR DG CHEST 2V
2 series · 2 of 2 positions shown · non-contrast
Comparison: Radiograph [DATE], chest CT [DATE]

CLINICAL DATA: Shortness of breath.  Right-sided weakness.

EXAM:
CHEST - 2 VIEW

[chest lat]
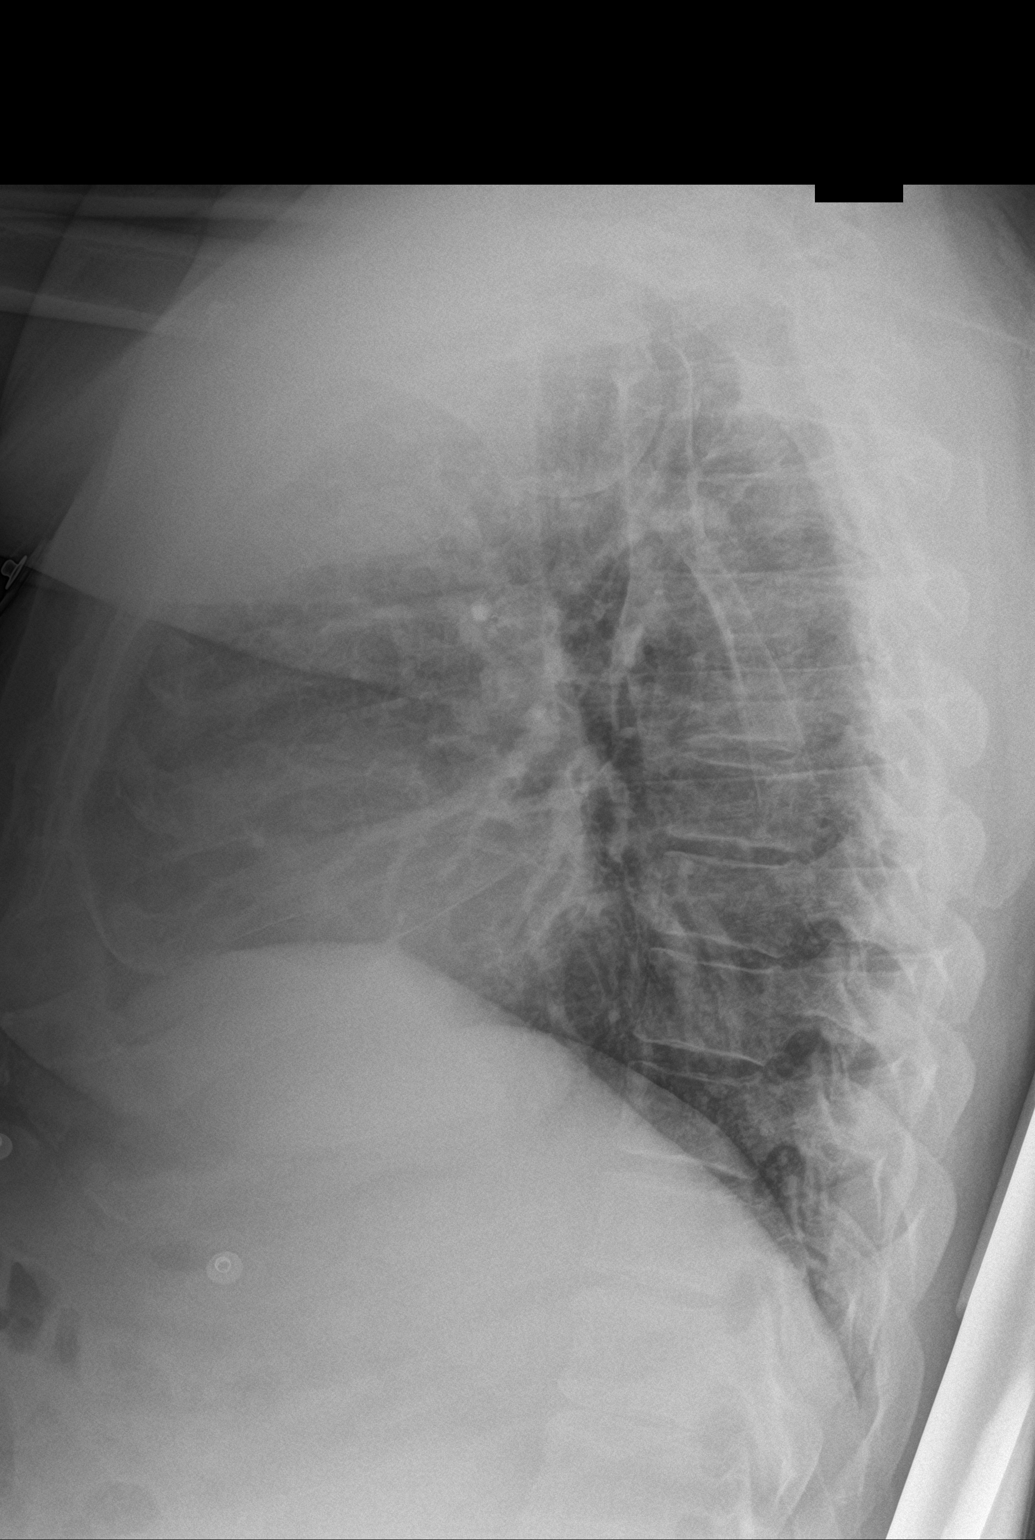

[chest ap]
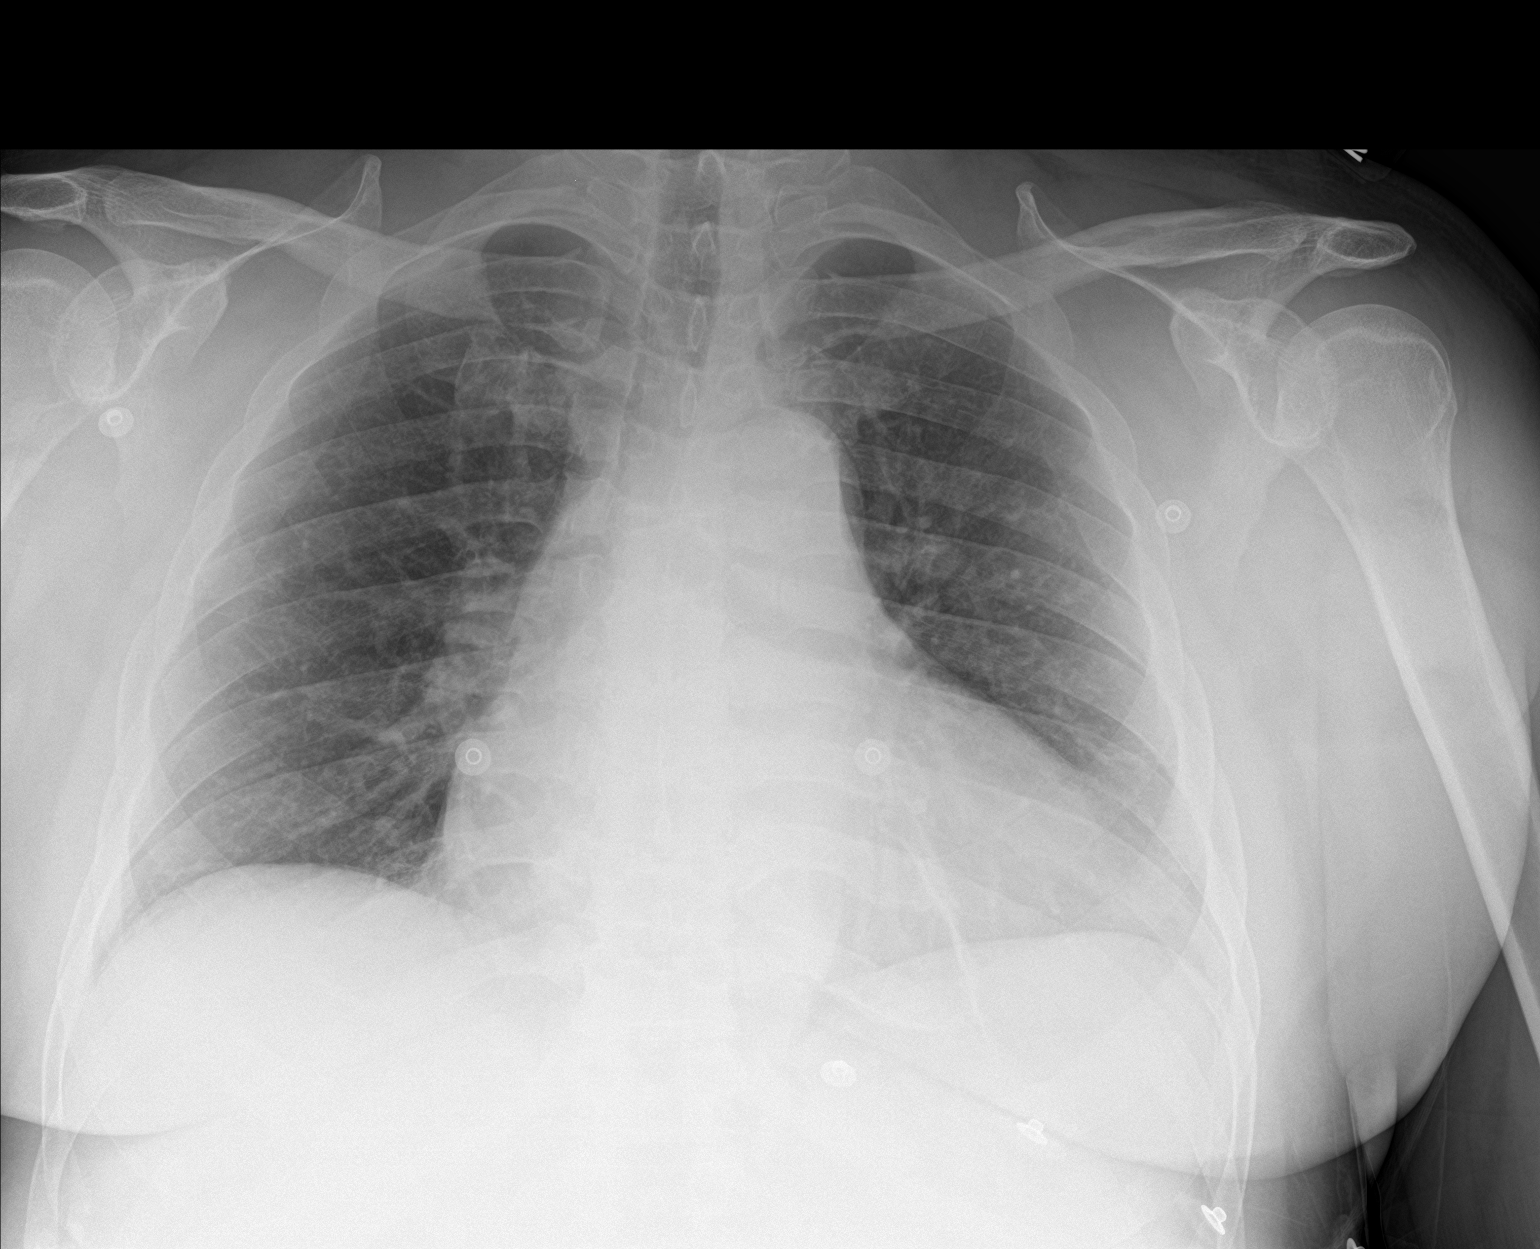

[2 of 2 positions shown; findings below may reference images not displayed]

FINDINGS: Lung volumes are low. Mild cardiomegaly. Bandlike scarring at the
left lung base. No acute or confluent airspace disease. No pulmonary
edema. No pleural effusion or pneumothorax. No acute osseous
abnormalities are seen.
IMPRESSION: Low lung volumes with mild cardiomegaly and left basilar scarring.

## 2020-09-13 IMAGING — CT CT HEAD W/O CM
4 series · 16 of 47 positions shown, 18 images · non-contrast
Comparison: MRI [DATE], CT [DATE]

CLINICAL DATA: Right leg weakness

EXAM:
CT HEAD WITHOUT CONTRAST
TECHNIQUE: Contiguous axial images were obtained from the base of the skull
through the vertex without intravenous contrast.

[Series 3: head without · axial · non-contrast · 0.48mm/px · z∈[-160,-40]mm · 7 of 33 slices shown, 9 images]
[im 5/33  brain]
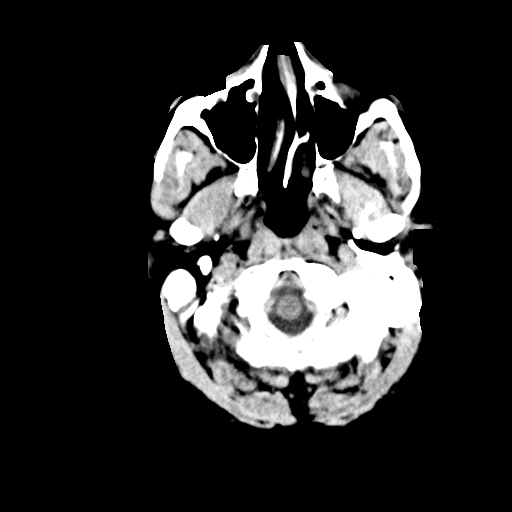
[im 5/33  bone]
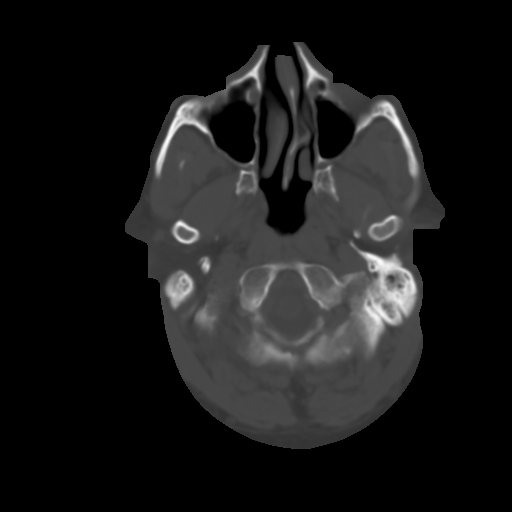
[im 9/33  brain]
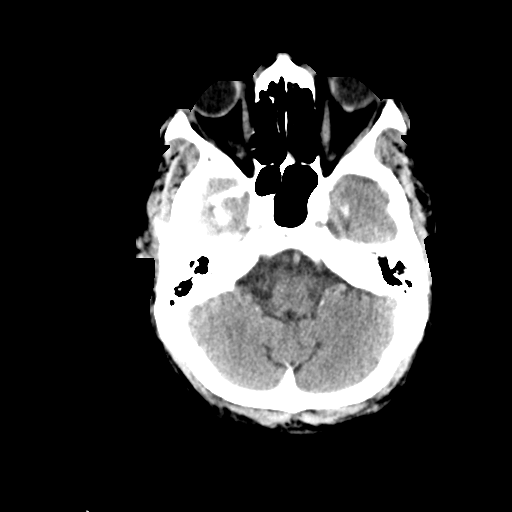
[im 13/33  brain]
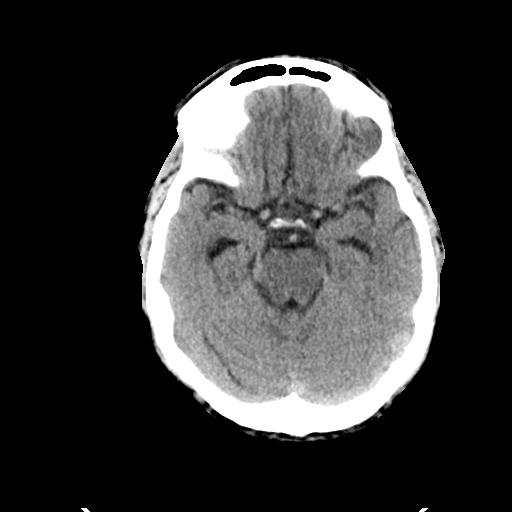
[im 17/33  brain]
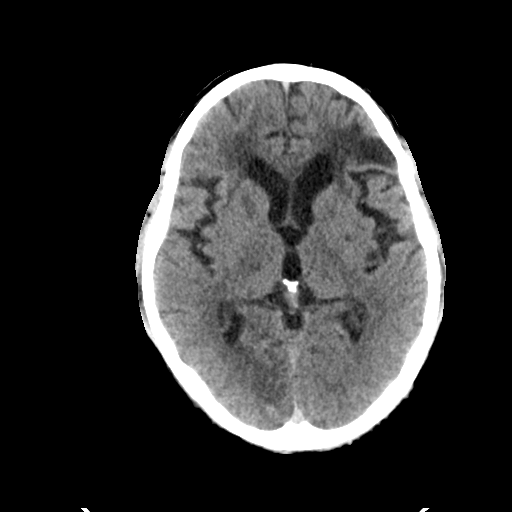
[im 21/33  brain]
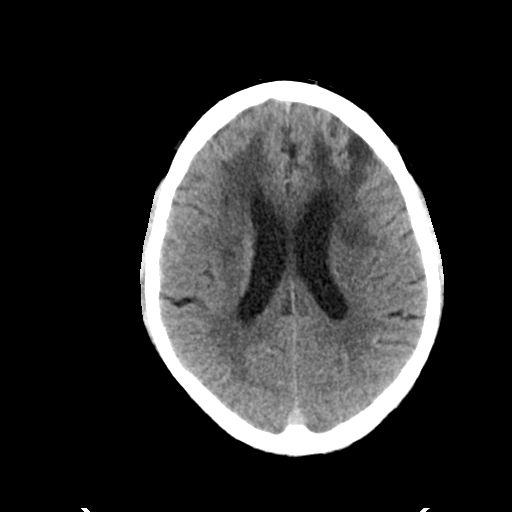
[im 21/33  bone]
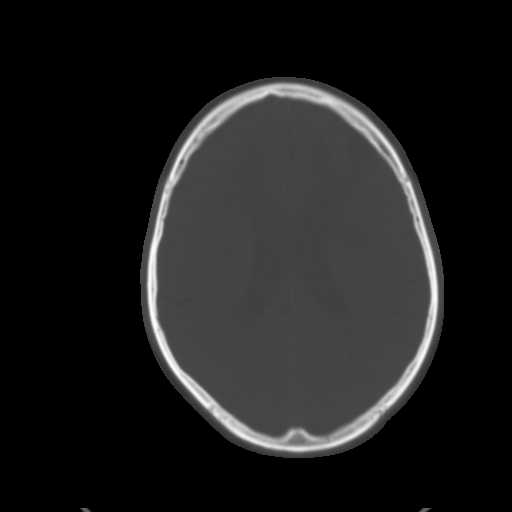
[im 25/33  brain]
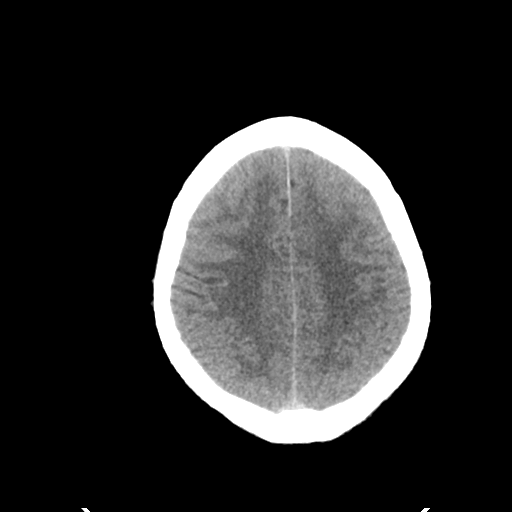
[im 29/33  brain]
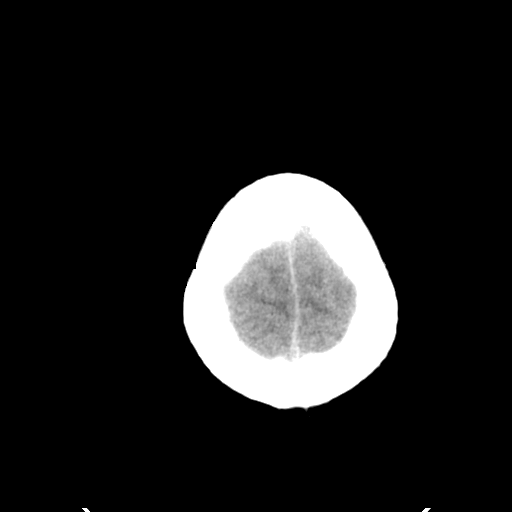

[Series 4: head bone · axial · 0.48mm/px · z∈[-164,-132]mm · 3 of 81 slices shown]
[im 9/81  bone]
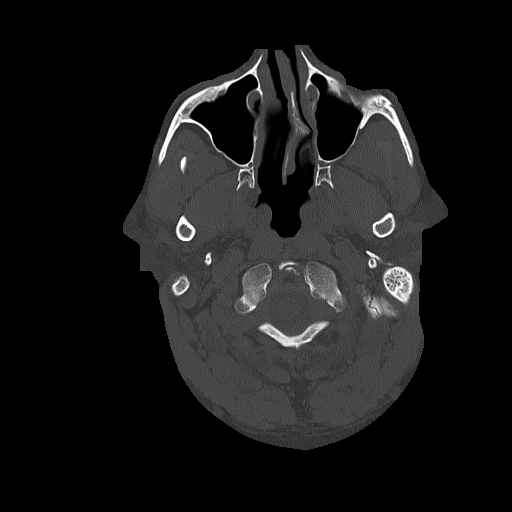
[im 17/81  bone]
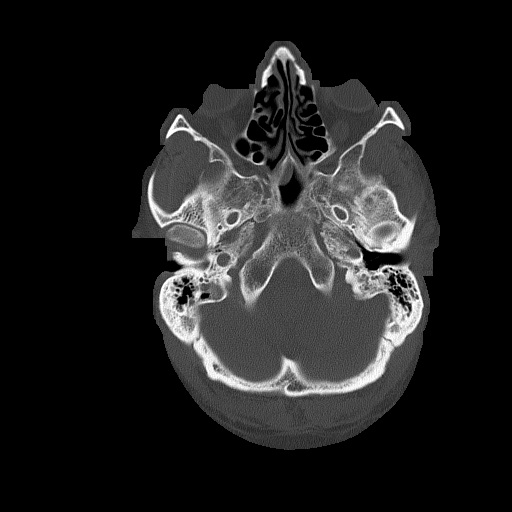
[im 25/81  bone]
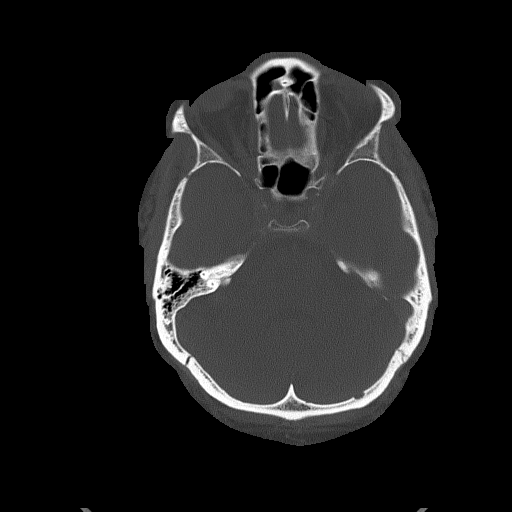

[Series 5: head without cor · coronal · non-contrast · 0.36mm/px · 3 of 70 slices shown]
[im 24/70  brain]
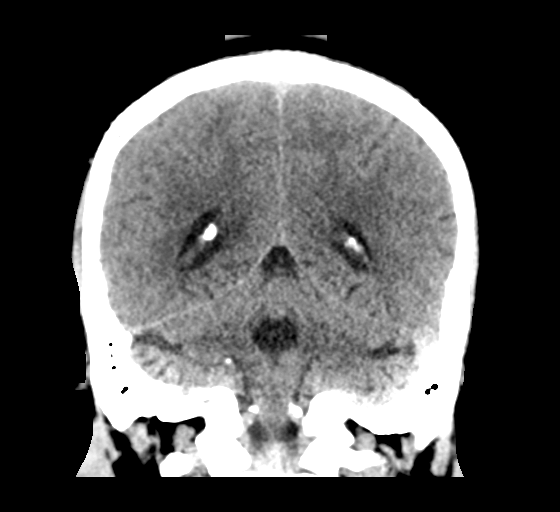
[im 31/70  brain]
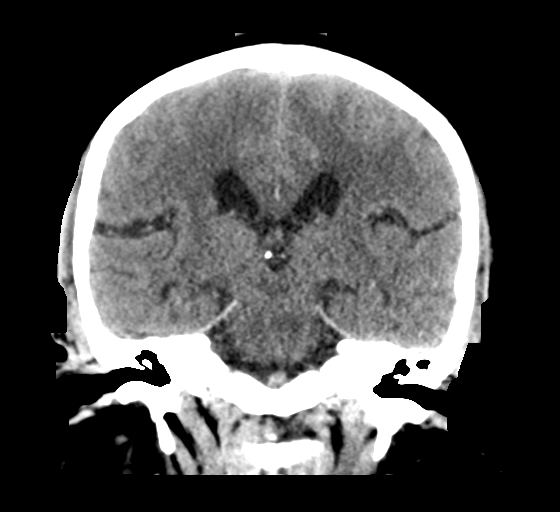
[im 39/70  brain]
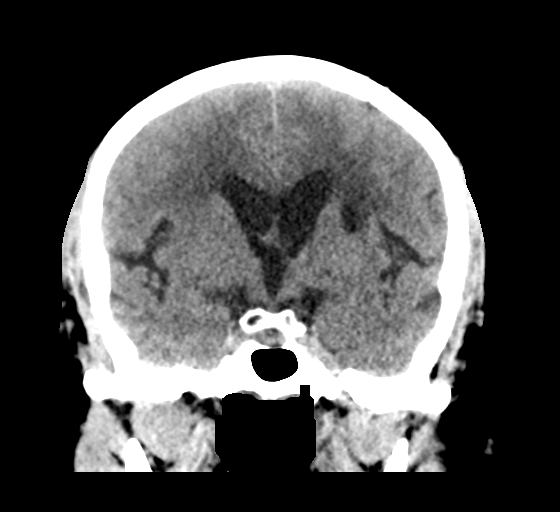

[Series 6: head without sag · sagittal · non-contrast · 0.36mm/px · 3 of 66 slices shown]
[im 22/66  brain]
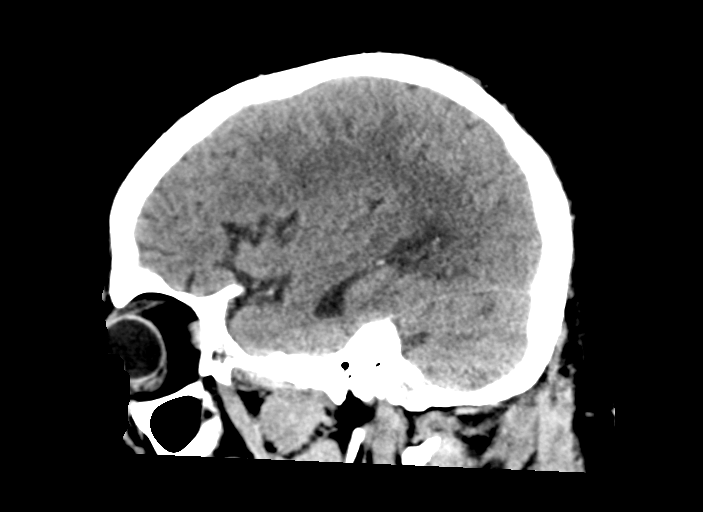
[im 33/66  brain]
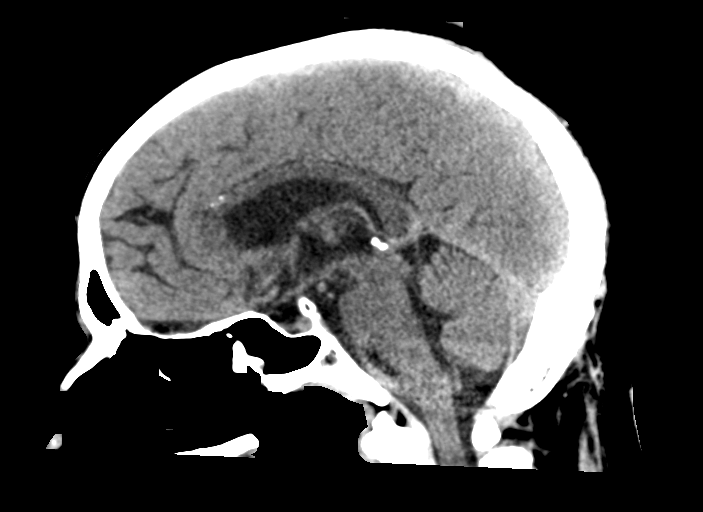
[im 44/66  brain]
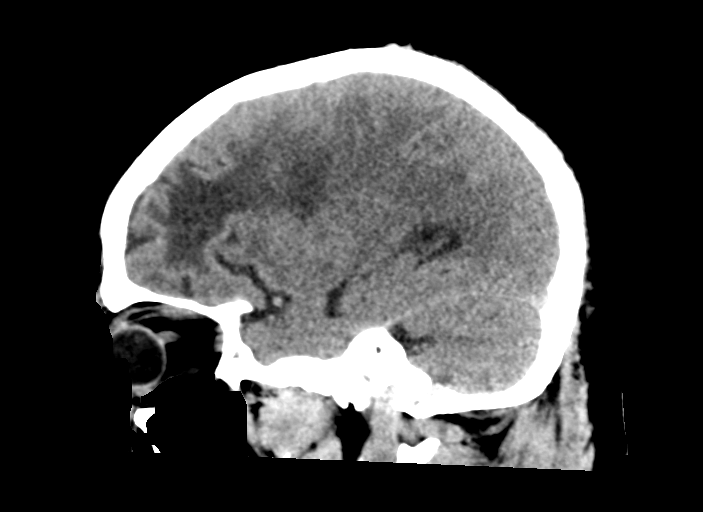

[16 of 47 positions shown; findings below may reference images not displayed]

FINDINGS: Brain: Mild residual hypodensity within the right occipital and
posterior temporal lobes as well as splenium corpus callosum
consistent with evolving right PCA infarct. No definitive hemorrhage
seen. Hypodensity in the left pons, also consistent with evolving
infarct. Negative for intracranial mass. Chronic left frontal lobe
infarct. Chronic bilateral white matter and basal ganglial infarcts.
Chronic lacunar infarcts in the thalamus. Stable ventricle size.
Chronic small vessel ischemic changes of the white matter.

Vascular: No hyperdense vessels. Mild carotid vascular
calcification. Vertebral artery calcification

Skull: Normal. Negative for fracture or focal lesion.

Sinuses/Orbits: Mucosal thickening in the sinuses

Other: None
IMPRESSION: 1. Evolving right PCA infarct without interval hemorrhage. Evolving
left pontine infarct without interval hemorrhage. No definite new
acute intracranial abnormality since prior study from [DATE].
2. Atrophy with chronic small vessel ischemic changes of the white
matter. Multiple chronic infarcts involving left frontal lobe,
bilateral white matter, basal ganglia and thalamus.

## 2020-09-13 NOTE — ED Notes (Signed)
Patient transported to X-ray 

## 2020-09-13 NOTE — ED Triage Notes (Signed)
C/o right sided weakness (sometime today). Denies any other symptoms.   [id number]

## 2020-09-13 NOTE — ED Notes (Signed)
Pt stated she was LWBS

## 2020-09-13 NOTE — ED Provider Notes (Signed)
Emergency Medicine Provider Triage Evaluation Note  Craig Knight , a 55 y.o. male  was evaluated in triage.  Pt complains of worsened right lower extremity weakness beginning today around 10 AM.  Patient was diagnosed with a stroke involving right-sided upper and lower extremity weakness as well as right-sided facial droop.  He states he was able to walk at that time.  Unable to walk due to worsening right lower extremity "heaviness."  Review of Systems  Positive: Worsened right leg weakness Negative: Numbness, syncope, headache  Physical Exam  BP (!) 141/84   Pulse 83   Temp 98.7 F (37.1 C) (Oral)   Resp 18   SpO2 95%  Gen:   Awake, no distress   Resp:  Normal effort  MSK:   Moves extremities without difficulty  Other:  He does have right leg weakness.  Grip strengths a little weaker on the right.  Medical Decision Making  Medically screening exam initiated at 7:17 PM.  Appropriate orders placed.  [name] was informed that the remainder of the evaluation will be completed by another provider, this initial triage assessment does not replace that evaluation, and the importance of remaining in the ED until their evaluation is complete.     Lorayne Bender, PA-C 09/13/20 1940    Drenda Freeze, MD 09/13/20 [phone]

## 2020-09-14 ENCOUNTER — Ambulatory Visit: Payer: Medicare Other | Attending: Student | Admitting: Occupational Therapy

## 2020-09-14 ENCOUNTER — Other Ambulatory Visit: Payer: Self-pay

## 2020-09-14 ENCOUNTER — Encounter: Payer: Self-pay | Admitting: Occupational Therapy

## 2020-09-14 ENCOUNTER — Ambulatory Visit: Payer: Medicare Other

## 2020-09-14 VITALS — BP 125/65 | HR 75

## 2020-09-14 DIAGNOSIS — R2689 Other abnormalities of gait and mobility: Secondary | ICD-10-CM | POA: Insufficient documentation

## 2020-09-14 DIAGNOSIS — M6281 Muscle weakness (generalized): Secondary | ICD-10-CM

## 2020-09-14 DIAGNOSIS — I69351 Hemiplegia and hemiparesis following cerebral infarction affecting right dominant side: Secondary | ICD-10-CM

## 2020-09-14 DIAGNOSIS — R2681 Unsteadiness on feet: Secondary | ICD-10-CM | POA: Insufficient documentation

## 2020-09-14 DIAGNOSIS — R41840 Attention and concentration deficit: Secondary | ICD-10-CM | POA: Insufficient documentation

## 2020-09-14 DIAGNOSIS — R208 Other disturbances of skin sensation: Secondary | ICD-10-CM

## 2020-09-14 NOTE — Therapy (Signed)
[facility] [address] [place], [place], [address] Phone: [phone]   Fax:  [phone]  Physical Therapy Evaluation  Patient Details  Name: Craig Knight MRN: [id number] Date of Birth: [birth year] Referring Provider (PT): Wendee Beavers MD   Encounter Date: 09/14/2020   PT End of Session - 09/14/20 1324     Visit Number 1    Number of Visits 17    Date for PT Re-Evaluation 11/16/20    Authorization Type UHC    Progress Note Due on Visit 10    PT Start Time 1230    PT Stop Time 1315    PT Time Calculation (min) 45 min    Equipment Utilized During Treatment Gait belt    Activity Tolerance Patient tolerated treatment well    Behavior During Therapy [facility] for tasks assessed/performed             Past Medical History:  Diagnosis Date   Asthma    Bronchitis    COPD (chronic obstructive pulmonary disease) ([place])    Diabetes mellitus without complication ([place])    Heart murmur 38/88/2800   soft systolic murmur 1/6   Hypertension    Sleep apnea    Stroke [facility])     Past Surgical History:  Procedure Laterality Date   COLONOSCOPY N/A 09/18/2016   Procedure: COLONOSCOPY;  Surgeon: Danie Binder, MD;  Location: [facility] ENDO SUITE;  Service: Endoscopy;  Laterality: N/A;  10:30 AM   lipoma removal      There were no vitals filed for this visit.    Subjective Assessment - 09/14/20 1319     Subjective Reports RLE weakness following a series of strokes and recent hospital stay as of 09/06/20.  Denies pain but feels RLE is "heavy".  Was walking w/o an AD in hospital butnow feels he cannot ambulate due to weakness in RLE and fear of falling    Patient is accompained by: Family member    Pertinent History 55yo male admitted 09/06/20 with R weakness and speech changes. 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 No tpa given. Per neuro notes, possibilty of 2 CVAs- one in R PCA  territory and one in L subcortical territory. PMH COPD, DM, HTN, CVA  Patient received in recliner, pleasant and cooperative with session. BLE strength and sensation symmetrical, able to perform RAM without difficulty. Has definite vision impairment L eye and unable to correctly identify number of fingers with R eye covered, but does report he has been having some vision issues in his left eye for some time now. Difficult to tease out acute vision changes due to this. Able to mobilize on a min guard basis with no device and cues for navigation in hallway. Left up in recliner with all needs met, chair alarm active and spouse present. Would benefit from skilled OP PT f/u in neuro setting.    Limitations Standing;Walking    How long can you sit comfortably? .15 min    How long can you stand comfortably? <5 min    How long can you walk comfortably? <5 min    Patient Stated Goals To return to independent ambulation    Currently in Pain? No/denies                [facility] PT Assessment - 09/14/20 0001       Assessment   Medical Diagnosis multiple strokes  Referring Provider (PT) Wendee Beavers MD    Onset Date/Surgical Date 09/06/20    Hand Dominance Right    Next MD Visit 10/12/20 Frann Rider NP    Prior Therapy PT Eval CIR      Precautions   Precautions Fall      Prior Function   Level of Independence Independent with household mobility without device    Vocation On disability      Tone   Assessment Location Right Lower Extremity      ROM / Strength   AROM / PROM / Strength AROM;Strength      AROM   Overall AROM  Deficits      Strength   Overall Strength Deficits    Overall Strength Comments 3 to 3+/5 RLE sterngth throughout(performance varied when distracted)      Transfers   Transfers Sit to Stand;Stand to Sit    Sit to Stand 4: Min guard      Ambulation/Gait   Ambulation/Gait Yes    Ambulation/Gait Assistance 4: Min guard;4: Min assist    Ambulation Distance (Feet) 40  Feet    Assistive device Rolling walker    Gait Pattern Step-through pattern;Step-to pattern    Ambulation Surface Level;Indoor    Gait Comments fatigues easily      RUE Tone   RUE Tone Mild;Hypotonic                        Objective measurements completed on examination: See above findings.       [facility] PT Treatment/Exercise - 09/14/20 0001       Transfers   Comments Able to stand with onl S at end of session                    PT Education - 09/14/20 1323     Education Details Patient and spouse educated on Eval findings, rehab potential and POC and are in agreement    Person(s) Educated Patient;Spouse    Comprehension Verbalized understanding              PT Short Term Goals - 09/14/20 1339       PT SHORT TERM GOAL #1   Title Patient to demo initial HEP to PT    Baseline TBD    Time 4    Period Weeks    Status New    Target Date 10/19/20      PT SHORT TERM GOAL #2   Title Patient to perform STS transfer with S only    Baseline CGA to perform STS transfer with cuing    Time 4    Period Weeks    Status New    Target Date 10/19/20      PT SHORT TERM GOAL #3   Title patient to ambulate 369f with LRAD and S    Baseline 471fwith RW and CGA    Time 4    Period Weeks    Status New    Target Date 10/19/20      PT SHORT TERM GOAL #4   Title Assess DGI/FGA and assign goal    Baseline TBD    Time 4    Period Weeks    Status New    Target Date 10/19/20      PT SHORT TERM GOAL #5   Title Assess BERG and assign appropriate goal    Baseline TBD    Time 4    Period Weeks  Status New    Target Date 10/19/20      Additional Short Term Goals   Additional Short Term Goals Yes      PT SHORT TERM GOAL #6   Title Assess TUG and set appropriate goals    Baseline TBD    Time 4    Period Weeks    Status New    Target Date 10/19/20      PT SHORT TERM GOAL #7   Title Assess 5x STS and establish appropriate goal     Baseline TBD    Time 4    Period Weeks    Status New    Target Date 10/19/20               PT Long Term Goals - 09/14/20 1347       PT LONG TERM GOAL #1   Title Patient to ambulate 540f with LRAD under S    Baseline 45fwith RW and CGA    Time 8    Period Weeks    Status New    Target Date 11/16/20      PT LONG TERM GOAL #2   Title Patient to negotiate a full flight of steps with most appropriate pattern    Baseline TBD    Time 8    Period Weeks    Status New    Target Date 11/16/20      PT LONG TERM GOAL #3   Title Assess progress towards BERG    Baseline TBD    Time 8    Period Weeks    Status New    Target Date 11/16/20      PT LONG TERM GOAL #4   Title Assess progress towards FGA/DGI    Baseline TBD    Time 8    Period Weeks    Status New    Target Date 11/16/20      PT LONG TERM GOAL #5   Title Assess progress towards TUG    Baseline TBD    Time 8    Period Weeks    Status New    Target Date 11/16/20      Additional Long Term Goals   Additional Long Term Goals Yes      PT LONG TERM GOAL #6   Title Assess progress towards 5x STS    Baseline TBD    Time 8    Period Weeks    Status New    Target Date 11/16/20                    Plan - 09/14/20 1327     Clinical Impression Statement Patient presents with decreased functional mobility following series of CVAs most recent 09/06/20.  He demos RLE weakness but good sensation and ROM, he demos more function when distracted than with volitional movements.  He is able to transfer with S/CGA and ambulate 4059fith RW and CGA but fatigues easily.  Due to c/o fatigue, further functional testing was deferred.  Patient would benefit from OPPT to improve overall sterngth and function and reduce fall risk and CG burden.    Personal Factors and Comorbidities Comorbidity 1    Comorbidities multiple CVAs    Examination-Activity Limitations Transfers;Locomotion Level    Stability/Clinical Decision  Making Evolving/Moderate complexity    Clinical Decision Making Moderate    Rehab Potential Good    PT Frequency 2x / week    PT Duration 8 weeks  PT Treatment/Interventions ADLs/Self Care Home Management;Aquatic Therapy;DME Instruction;Gait training;Stair training;Functional mobility training;Therapeutic activities;Therapeutic exercise;Balance training;Neuromuscular re-education    PT Next Visit Plan DGI/FGA, BERG, gait velocity, 5x STS, TUG as tolerated    PT Home Exercise Plan fall prevention    Consulted and Agree with Plan of Care Patient;Family member/caregiver             Patient will benefit from skilled therapeutic intervention in order to improve the following deficits and impairments:  Abnormal gait, Difficulty walking, Decreased endurance, Decreased activity tolerance, Decreased balance, Decreased mobility, Decreased strength, Postural dysfunction  Visit Diagnosis: Unsteadiness on feet  Muscle weakness (generalized)  Other abnormalities of gait and mobility     Problem List Patient Active Problem List   Diagnosis Date Noted   Acute CVA (cerebrovascular accident) ([place]) 09/06/2020   Stroke ([place]) 11/23/2018   Numbness 11/23/2018   CVA (cerebral vascular accident) ([place]) 11/23/2018   Lung nodule 11/17/2017   Chest pressure 11/22/2016   Shortness of breath 11/22/2016   Depression, major, single episode, moderate ([place]) 10/14/2016   Special screening for malignant neoplasms, colon    Chronic obstructive pulmonary disease ([place]) 06/08/2016   Hypertensive emergency 02/16/2016   Hypertensive urgency 06/07/2015   CKD (chronic kidney disease), stage III ([place]) 06/07/2015   Pain in the chest    Elevated troponin    Chronic diastolic CHF (congestive heart failure) ([place]) 09/17/2013   DM type 2 (diabetes mellitus, type 2) ([place]) 09/15/2013   Chest pain 08/01/2011   HTN (hypertension) 08/01/2011   Chest pain 07/22/2011   Renal insufficiency 07/22/2011   Tobacco abuse  07/22/2011   Hypertension     Lanice Shirts PT 09/14/2020, 1:56 PM  [facility] [address] [place], [place], [address] Phone: [phone]   Fax:  [phone]  Name: MAHARI STRAHM MRN: [id number] Date of Birth: 04/25/1965

## 2020-09-14 NOTE — Therapy (Signed)
[facility] [address] [place], [place], [address] Phone: [phone]   Fax:  [phone]  Occupational Therapy Evaluation  Patient Details  Name: Craig Knight MRN: [id number] Date of Birth: March 19, 1965 Referring Provider (OT): Wendee Beavers - will send to Frann Rider   Encounter Date: 09/14/2020   OT End of Session - 09/14/20 1538     Visit Number 1    Number of Visits 17    Date for OT Re-Evaluation 11/28/20    Authorization Type UHC Medicare    Progress Note Due on Visit 10    OT Start Time 1145    OT Stop Time 1230    OT Time Calculation (min) 45 min    Activity Tolerance Patient tolerated treatment well    Behavior During Therapy [facility] for tasks assessed/performed             Past Medical History:  Diagnosis Date   Asthma    Bronchitis    COPD (chronic obstructive pulmonary disease) ([place])    Diabetes mellitus without complication ([place])    Heart murmur 70/02/7492   soft systolic murmur 1/6   Hypertension    Sleep apnea    Stroke [facility])     Past Surgical History:  Procedure Laterality Date   COLONOSCOPY N/A 09/18/2016   Procedure: COLONOSCOPY;  Surgeon: Danie Binder, MD;  Location: [facility] ENDO SU[facility];  Service: Endoscopy;  Laterality: N/A;  10:30 AM   lipoma removal      Vitals:   09/14/20 1152  BP: 125/65  Pulse: 75     Subjective Assessment - 09/14/20 1200     Subjective  Use arms better, and make my leg stronger    Currently in Pain? 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      ADL   Eating/Feeding Minimal assistance    Grooming Minimal assistance    Upper Body Bathing Modified independent    Lower Body Bathing Modified independent    Upper Body Dressing Set up    Lower Body Dressing Moderate assistance    Toilet Transfer Supervision/safety    Tub/Shower Transfer Supervision/safety    ADL comments Wife is close by when patient is moving due to decreased balance      Written Expression   Dominant Hand Right    Handwriting 50% legible      Vision - History   Baseline Vision Other (comment)    Additional Comments Patient reports visual chnages that have occured with aging, and with prior strokes.  Does not report new visual problems      Vision Assessment   Eye Alignment Within Functional Limits    Ocular Range of Motion Impaired to be futher tested in functional context    Comment Need further assessment of vision      Activity Tolerance  Activity Tolerance Tolerates 10-20 min activity with multiple rests    Activity Tolerance Comments Patient very fatigued at start of OT session      Cognition   Overall Cognitive Status Impaired/Different from baseline    Attention Sustained    Awareness Impaired    Cognition Comments Patient frequently starts conversation then loses initial thought      Posture/Postural Control   Posture/Postural Control Postural limitations    Postural Limitations Rounded Shoulders;Forward head;Posterior pelvic tilt      Sensation   Light Touch Impaired by gross assessment    Stereognosis Impaired by gross assessment    Proprioception Impaired by gross assessment    Additional Comments Reports numbness      Coordination   Gross Motor Movements are Fluid and Coordinated No    Fine Motor Movements are Fluid and Coordinated No    9 Hole Peg Test Right;Left    Right 9 Hole Peg Test 2.33.84    Left 9 Hole Peg Test 57.97      Praxis   Praxis Intact      Tone    Assessment Location Right Upper Extremity      ROM / Strength   AROM / PROM / Strength AROM;Strength      AROM   Overall AROM  Deficits    Overall AROM Comments Unable to sustain position in ened range shoulder, elbow, wrist      Strength   Overall Strength Deficits    Overall Strength Comments difficulty sustaining RUE against gravity      Hand Function   Right Hand Gross Grasp Impaired    Right Hand Grip (lbs) 34.6    Left Hand Gross Grasp Functional    Left Hand Grip (lbs) 84.8      RUE Tone   RUE Tone Mild;Hypotonic                             OT Education - 09/14/20 1537     Education Details results of eval and potential plan of care/ goals    Person(s) Educated Patient;Spouse    Methods Explanation    Comprehension Verbalized understanding;Need further instruction              OT Short Term Goals - 09/14/20 1546       OT SHORT TERM GOAL #1   Title Patient will complete an HEP designed to improve coordination in RUE    Time 4    Period Weeks    Status New    Target Date 10/29/20      OT SHORT TERM GOAL #2   Title Patient will complete an HEP designed to improve functional strength in RUE    Time 4    Period Weeks    Status New      OT SHORT TERM GOAL #3   Title Patient will complete 9 hole peg test in less than 2 min with RUE    Time 4    Period Weeks    Status New      OT SHORT TERM GOAL #4   Title Patient will write short sentence with RUE legibly    Time 4    Period Weeks    Status New               OT Long Term Goals - 09/14/20 1548       OT LONG TERM GOAL #1   Title Patient  will complete self care skills with modified independence    Time 8    Period Weeks    Status New      OT LONG TERM GOAL #2   Title Patient will complete and updated Home Activities Program designed to improve functional use of RUE    Time 8    Period Weeks    Status New      OT LONG TERM GOAL #3   Title Patient will demonstrate  at least 40 lb of grip strength RUE    Time 8    Period Weeks    Status New      OT LONG TERM GOAL #4   Title Patient will complete 9 hole peg test in less than 63min 30 sec with RUE    Time 8    Period Weeks    Status New      OT LONG TERM GOAL #5   Title Patient will demonstrate mid to high reach to retrieve/ replace a 2 lb object with RUE without report of pain    Time 8    Period Weeks    Status New      Long Term Additional Goals   Additional Long Term Goals Yes      OT LONG TERM GOAL #6   Title Complete FOTO    Time 8    Period Weeks    Status New                   Plan - 09/14/20 1538     Clinical Impression Statement Patient is a 55 year old M with PMH of HTN, DM-2, diastolic CHF, MWN-0U, COPD and tobacco use disorder presented to [facility] ED with right-sided weakness changes in speech.  CT head revealed acute right PCA territory infarct.   Patient was transferred to [facility] for further evaluation and treatment.  MRI brain confirmed right PCA territories infarct and also showed left pontine infarct. Patient presents to OT evaluation visible fatigued after walking in from parking lot.  Patient with low frustration tolerance, reporting I think my right leg is even weaker.  Patient with right hemiparesis (dominant), decreased sesnation, coordiantion, and weakness in Right limbs which impedes his safety and full aprticipation in ADL/IADL.    OT Occupational Profile and History Detailed Assessment- Review of Records and additional review of physical, cognitive, psychosocial history related to current functional performance    Occupational performance deficits (Please refer to evaluation for details): ADL's;IADL's    Body Structure / Function / Physical Skills ADL;Dexterity;Flexibility;ROM;Strength;Vision;Balance;Coordination;Edema;FMC;IADL;Tone;UE functional use;Sensation;Endurance;Decreased knowledge of precautions;Body mechanics;Decreased knowledge of use of  DME;GMC;Mobility;Proprioception    Cognitive Skills Attention;Emotional;Learn;Safety Awareness;Thought    Rehab Potential Good    Clinical Decision Making Several treatment options, min-mod task modification necessary    Comorbidities Affecting Occupational Performance: May have comorbidities impacting occupational performance    Modification or Assistance to Complete Evaluation  Min-Moderate modification of tasks or assist with assess necessary to complete eval    OT Frequency 2x / week    OT Duration 8 weeks    OT Treatment/Interventions Self-care/ADL training;Therapeutic exercise;DME and/or AE instruction;Functional Mobility Training;Cognitive remediation/compensation;Balance training;Visual/perceptual remediation/compensation;Splinting;Manual Therapy;Neuromuscular education;Aquatic Therapy;Therapeutic activities;Patient/family education    Plan Needs HEP for RUE.  Functional mobility    Consulted and Agree with Plan of Care Patient;Family member/caregiver    Family Member Consulted wife [name]             Patient will benefit from skilled therapeutic intervention in order to  improve the following deficits and impairments:   Body Structure / Function / Physical Skills: ADL, Dexterity, Flexibility, ROM, Strength, Vision, Balance, Coordination, Edema, FMC, IADL, Tone, UE functional use, Sensation, Endurance, Decreased knowledge of precautions, Body mechanics, Decreased knowledge of use of DME, GMC, Mobility, Proprioception Cognitive Skills: Attention, Emotional, Learn, Safety Awareness, Thought     Visit Diagnosis: Muscle weakness (generalized) - Plan: Ot plan of care cert/re-cert  Hemiplegia and hemiparesis following cerebral infarction affecting right dominant side ([place]) - Plan: Ot plan of care cert/re-cert  Unsteadiness on feet - Plan: Ot plan of care cert/re-cert  Other disturbances of skin sensation - Plan: Ot plan of care cert/re-cert  Attention and concentration deficit -  Plan: Ot plan of care cert/re-cert    Problem List Patient Active Problem List   Diagnosis Date Noted   Acute CVA (cerebrovascular accident) ([place]) 09/06/2020   Stroke ([place]) 11/23/2018   Numbness 11/23/2018   CVA (cerebral vascular accident) ([place]) 11/23/2018   Lung nodule 11/17/2017   Chest pressure 11/22/2016   Shortness of breath 11/22/2016   Depression, major, single episode, moderate ([place]) 10/14/2016   Special screening for malignant neoplasms, colon    Chronic obstructive pulmonary disease ([place]) 06/08/2016   Hypertensive emergency 02/16/2016   Hypertensive urgency 06/07/2015   CKD (chronic kidney disease), stage III ([place]) 06/07/2015   Pain in the chest    Elevated troponin    Chronic diastolic CHF (congestive heart failure) ([place]) 09/17/2013   DM type 2 (diabetes mellitus, type 2) ([place]) 09/15/2013   Chest pain 08/01/2011   HTN (hypertension) 08/01/2011   Chest pain 07/22/2011   Renal insufficiency 07/22/2011   Tobacco abuse 07/22/2011   Hypertension     Marlowe Sax M,OTR/L 09/14/2020, 3:54 PM  [facility] [address] [place], [place], [address] Phone: [phone]   Fax:  [phone]  Name: Craig Knight MRN: [id number] Date of Birth: [birth year]

## 2020-09-15 ENCOUNTER — Other Ambulatory Visit: Payer: Self-pay

## 2020-09-15 ENCOUNTER — Emergency Department (HOSPITAL_COMMUNITY): Payer: Medicare Other

## 2020-09-15 ENCOUNTER — Encounter (HOSPITAL_COMMUNITY): Payer: Self-pay

## 2020-09-15 ENCOUNTER — Inpatient Hospital Stay (HOSPITAL_COMMUNITY)
Admission: EM | Admit: 2020-09-15 | Discharge: 2020-09-19 | DRG: 065 | Disposition: A | Discharge status: Rehabilitation Facility | Payer: Medicare Other | Attending: Internal Medicine | Admitting: Internal Medicine

## 2020-09-15 DIAGNOSIS — I63531 Cerebral infarction due to unspecified occlusion or stenosis of right posterior cerebral artery: Principal | ICD-10-CM | POA: Diagnosis present

## 2020-09-15 DIAGNOSIS — F17210 Nicotine dependence, cigarettes, uncomplicated: Secondary | ICD-10-CM | POA: Diagnosis present

## 2020-09-15 DIAGNOSIS — F329 Major depressive disorder, single episode, unspecified: Secondary | ICD-10-CM | POA: Diagnosis present

## 2020-09-15 DIAGNOSIS — I69319 Unspecified symptoms and signs involving cognitive functions following cerebral infarction: Secondary | ICD-10-CM | POA: Diagnosis not present

## 2020-09-15 DIAGNOSIS — N179 Acute kidney failure, unspecified: Secondary | ICD-10-CM | POA: Diagnosis not present

## 2020-09-15 DIAGNOSIS — Z91048 Other nonmedicinal substance allergy status: Secondary | ICD-10-CM

## 2020-09-15 DIAGNOSIS — E1122 Type 2 diabetes mellitus with diabetic chronic kidney disease: Secondary | ICD-10-CM

## 2020-09-15 DIAGNOSIS — Z823 Family history of stroke: Secondary | ICD-10-CM | POA: Diagnosis not present

## 2020-09-15 DIAGNOSIS — Z20822 Contact with and (suspected) exposure to covid-19: Secondary | ICD-10-CM | POA: Diagnosis present

## 2020-09-15 DIAGNOSIS — I4891 Unspecified atrial fibrillation: Secondary | ICD-10-CM

## 2020-09-15 DIAGNOSIS — Z7984 Long term (current) use of oral hypoglycemic drugs: Secondary | ICD-10-CM | POA: Diagnosis not present

## 2020-09-15 DIAGNOSIS — I69398 Other sequelae of cerebral infarction: Secondary | ICD-10-CM | POA: Diagnosis not present

## 2020-09-15 DIAGNOSIS — N183 Chronic kidney disease, stage 3 unspecified: Secondary | ICD-10-CM | POA: Diagnosis present

## 2020-09-15 DIAGNOSIS — I69391 Dysphagia following cerebral infarction: Secondary | ICD-10-CM | POA: Diagnosis not present

## 2020-09-15 DIAGNOSIS — J449 Chronic obstructive pulmonary disease, unspecified: Secondary | ICD-10-CM

## 2020-09-15 DIAGNOSIS — I5032 Chronic diastolic (congestive) heart failure: Secondary | ICD-10-CM | POA: Diagnosis not present

## 2020-09-15 DIAGNOSIS — I714 Abdominal aortic aneurysm, without rupture: Secondary | ICD-10-CM | POA: Diagnosis not present

## 2020-09-15 DIAGNOSIS — N1831 Chronic kidney disease, stage 3a: Secondary | ICD-10-CM | POA: Diagnosis not present

## 2020-09-15 DIAGNOSIS — Z7982 Long term (current) use of aspirin: Secondary | ICD-10-CM

## 2020-09-15 DIAGNOSIS — N1832 Chronic kidney disease, stage 3b: Secondary | ICD-10-CM

## 2020-09-15 DIAGNOSIS — I69351 Hemiplegia and hemiparesis following cerebral infarction affecting right dominant side: Secondary | ICD-10-CM

## 2020-09-15 DIAGNOSIS — E041 Nontoxic single thyroid nodule: Secondary | ICD-10-CM | POA: Diagnosis not present

## 2020-09-15 DIAGNOSIS — Z7902 Long term (current) use of antithrombotics/antiplatelets: Secondary | ICD-10-CM

## 2020-09-15 DIAGNOSIS — I10 Essential (primary) hypertension: Secondary | ICD-10-CM | POA: Diagnosis present

## 2020-09-15 DIAGNOSIS — R0602 Shortness of breath: Secondary | ICD-10-CM | POA: Diagnosis present

## 2020-09-15 DIAGNOSIS — E1142 Type 2 diabetes mellitus with diabetic polyneuropathy: Secondary | ICD-10-CM | POA: Diagnosis not present

## 2020-09-15 DIAGNOSIS — J441 Chronic obstructive pulmonary disease with (acute) exacerbation: Secondary | ICD-10-CM | POA: Diagnosis not present

## 2020-09-15 DIAGNOSIS — Z6835 Body mass index (BMI) 35.0-35.9, adult: Secondary | ICD-10-CM

## 2020-09-15 DIAGNOSIS — E042 Nontoxic multinodular goiter: Secondary | ICD-10-CM | POA: Diagnosis not present

## 2020-09-15 DIAGNOSIS — Z79899 Other long term (current) drug therapy: Secondary | ICD-10-CM

## 2020-09-15 DIAGNOSIS — R29704 NIHSS score 4: Secondary | ICD-10-CM | POA: Diagnosis present

## 2020-09-15 DIAGNOSIS — I130 Hypertensive heart and chronic kidney disease with heart failure and stage 1 through stage 4 chronic kidney disease, or unspecified chronic kidney disease: Secondary | ICD-10-CM | POA: Diagnosis present

## 2020-09-15 DIAGNOSIS — G4733 Obstructive sleep apnea (adult) (pediatric): Secondary | ICD-10-CM | POA: Diagnosis present

## 2020-09-15 DIAGNOSIS — I69392 Facial weakness following cerebral infarction: Secondary | ICD-10-CM | POA: Diagnosis not present

## 2020-09-15 DIAGNOSIS — E119 Type 2 diabetes mellitus without complications: Secondary | ICD-10-CM

## 2020-09-15 DIAGNOSIS — E669 Obesity, unspecified: Secondary | ICD-10-CM | POA: Diagnosis present

## 2020-09-15 DIAGNOSIS — Z8249 Family history of ischemic heart disease and other diseases of the circulatory system: Secondary | ICD-10-CM

## 2020-09-15 DIAGNOSIS — I639 Cerebral infarction, unspecified: Secondary | ICD-10-CM | POA: Diagnosis not present

## 2020-09-15 DIAGNOSIS — I69322 Dysarthria following cerebral infarction: Secondary | ICD-10-CM | POA: Diagnosis not present

## 2020-09-15 DIAGNOSIS — I351 Nonrheumatic aortic (valve) insufficiency: Secondary | ICD-10-CM | POA: Diagnosis not present

## 2020-09-15 DIAGNOSIS — Z888 Allergy status to other drugs, medicaments and biological substances status: Secondary | ICD-10-CM

## 2020-09-15 LAB — COMPREHENSIVE METABOLIC PANEL
ALT: 19 U/L (ref 0–44)
AST: 20 U/L (ref 15–41)
Albumin: 4.4 g/dL (ref 3.5–5.0)
Alkaline Phosphatase: 87 U/L (ref 38–126)
Anion gap: 11 (ref 5–15)
BUN: 33 mg/dL — ABNORMAL HIGH (ref 6–20)
CO2: 20 mmol/L — ABNORMAL LOW (ref 22–32)
Calcium: 9.7 mg/dL (ref 8.9–10.3)
Chloride: 106 mmol/L (ref 98–111)
Creatinine, Ser: 2.01 mg/dL — ABNORMAL HIGH (ref 0.61–1.24)
GFR, Estimated: 38 mL/min — ABNORMAL LOW (ref >60)
Glucose, Bld: 88 mg/dL (ref 70–99)
Potassium: 3.5 mmol/L (ref 3.5–5.1)
Sodium: 137 mmol/L (ref 135–145)
Total Bilirubin: 0.9 mg/dL (ref 0.3–1.2)
Total Protein: 7.7 g/dL (ref 6.5–8.1)

## 2020-09-15 LAB — RESP PANEL BY RT-PCR (FLU A&B, COVID) ARPGX2
Influenza A by PCR: NEGATIVE
Influenza B by PCR: NEGATIVE
SARS Coronavirus 2 by RT PCR: NEGATIVE

## 2020-09-15 LAB — CBC WITH DIFFERENTIAL/PLATELET
Abs Immature Granulocytes: 0.04 10*3/uL (ref 0.00–0.07)
Basophils Absolute: 0 10*3/uL (ref 0.0–0.1)
Basophils Relative: 0 %
Eosinophils Absolute: 0.1 10*3/uL (ref 0.0–0.5)
Eosinophils Relative: 1 %
HCT: 43.9 % (ref 39.0–52.0)
Hemoglobin: 14.9 g/dL (ref 13.0–17.0)
Immature Granulocytes: 0 %
Lymphocytes Relative: 8 %
Lymphs Abs: 1.1 10*3/uL (ref 0.7–4.0)
MCH: 27.3 pg (ref 26.0–34.0)
MCHC: 33.9 g/dL (ref 30.0–36.0)
MCV: 80.6 fL (ref 80.0–100.0)
Monocytes Absolute: 1 10*3/uL (ref 0.1–1.0)
Monocytes Relative: 8 %
Neutro Abs: 10.6 10*3/uL — ABNORMAL HIGH (ref 1.7–7.7)
Neutrophils Relative %: 83 %
Platelets: 284 10*3/uL (ref 150–400)
RBC: 5.45 MIL/uL (ref 4.22–5.81)
RDW: 14.2 % (ref 11.5–15.5)
WBC: 12.8 10*3/uL — ABNORMAL HIGH (ref 4.0–10.5)
nRBC: 0 % (ref 0.0–0.2)

## 2020-09-15 LAB — GLUCOSE, CAPILLARY: Glucose-Capillary: 148 mg/dL — ABNORMAL HIGH (ref 70–99)

## 2020-09-15 LAB — PROTIME-INR
INR: 1 (ref 0.8–1.2)
Prothrombin Time: 13.6 seconds (ref 11.4–15.2)

## 2020-09-15 LAB — TROPONIN I (HIGH SENSITIVITY)
Troponin I (High Sensitivity): 33 ng/L — ABNORMAL HIGH (ref <18)
Troponin I (High Sensitivity): 50 ng/L — ABNORMAL HIGH (ref <18)

## 2020-09-15 IMAGING — MR MR HEAD W/O CM
11 of 12 series · 36 of 48 positions shown · non-contrast
Comparison: Head CT [DATE] and MRI [DATE]

CLINICAL DATA: Transient ischemic attack (TIA). Right leg weakness.

EXAM:
MRI HEAD WITHOUT CONTRAST
TECHNIQUE: Multiplanar, multiecho pulse sequences of the brain and surrounding
structures were obtained without intravenous contrast.

[Series 5: DWI · axial · 3.0mm · 0.77mm/px · z∈[-93,+48]mm · 4 of 48 slices shown (1 of 4)]
[im 1/48]
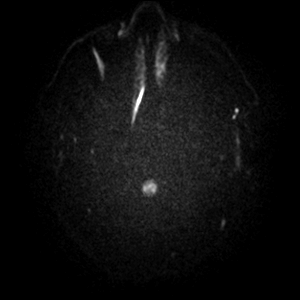
[im 16/48]
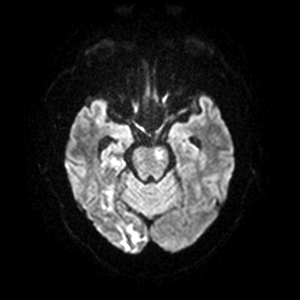
[im 32/48]
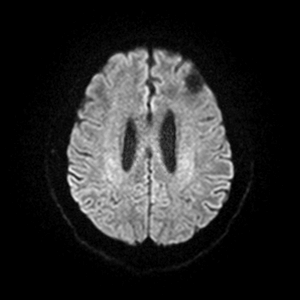
[im 48/48]
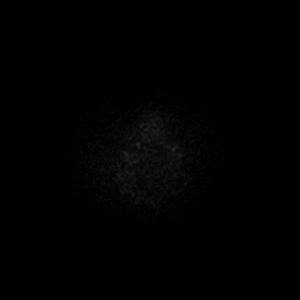

[Series 6: DWI · axial · 3.0mm · 0.77mm/px · z∈[-93,+48]mm · 4 of 48 slices shown (2 of 4)]
[im 1/48]
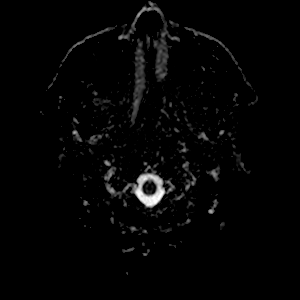
[im 16/48]
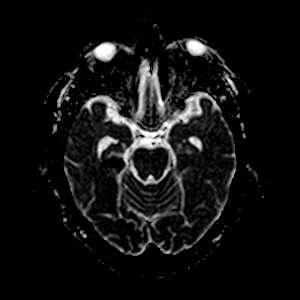
[im 32/48]
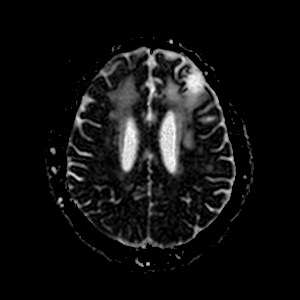
[im 48/48]
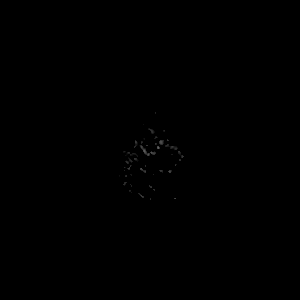

[Series 7: DWI · coronal · 5.0mm · 0.88mm/px · 2 of 30 slices shown (3 of 4)]
[im 1/30]
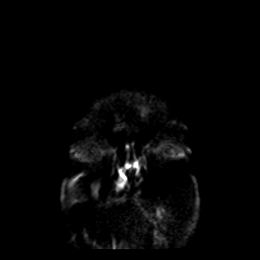
[im 30/30]
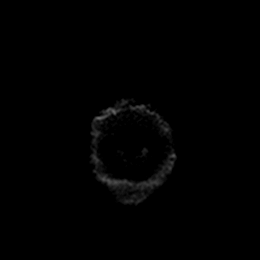

[Series 8: DWI · coronal · 5.0mm · 0.88mm/px · 2 of 30 slices shown (4 of 4)]
[im 1/30]
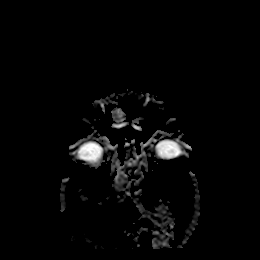
[im 30/30]
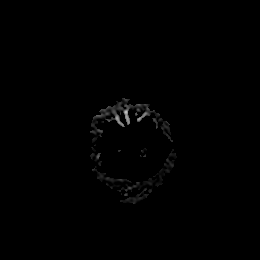

[Series 9: T1 · sagittal · 5.0mm · 0.75mm/px · 2 of 19 slices shown]
[im 1/19]
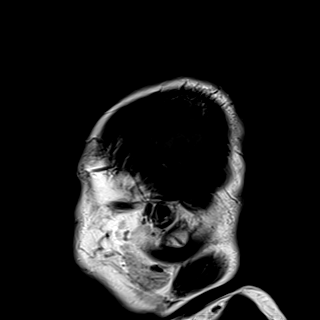
[im 19/19]
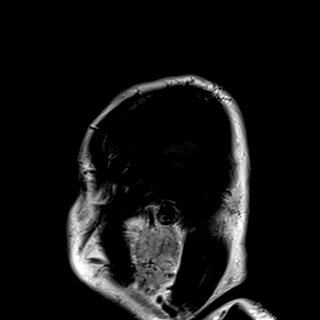

[Series 10: T2 · axial · 5.0mm · 0.72mm/px · z∈[-89,+44]mm · 2 of 20 slices shown (1 of 2)]
[im 1/20]
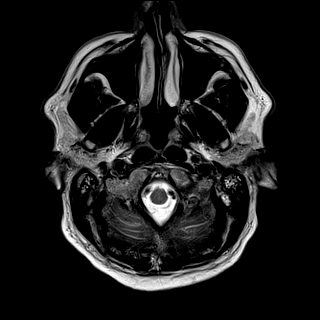
[im 20/20]
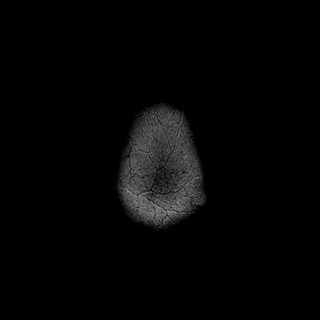

[Series 11: mag_images · axial · 3.0mm · 0.90mm/px · z∈[-111,+65]mm · 5 of 60 slices shown]
[im 1/60]
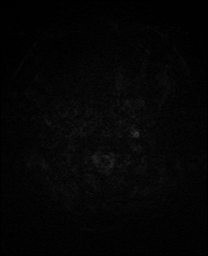
[im 15/60]
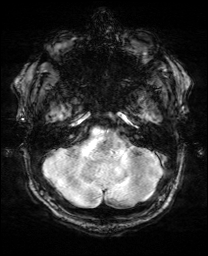
[im 30/60]
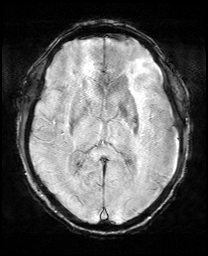
[im 45/60]
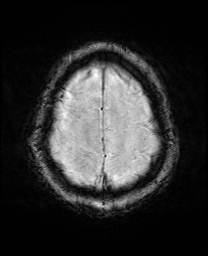
[im 60/60]
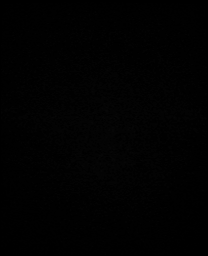

[Series 12: pha_images · axial · 3.0mm · 0.90mm/px · z∈[-108,+56]mm · 5 of 56 slices shown]
[im 1/56]
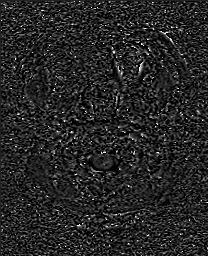
[im 14/56]
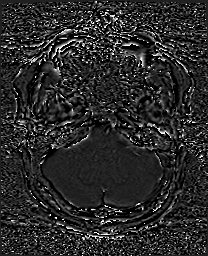
[im 28/56]
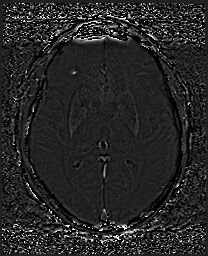
[im 42/56]
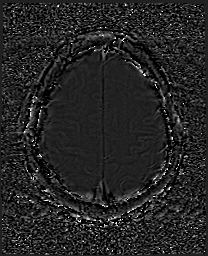
[im 56/56]
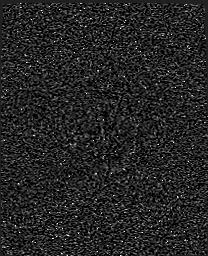

[Series 13: swi_images · axial · 3.0mm · 0.90mm/px · z∈[-111,+65]mm · 5 of 60 slices shown]
[im 1/60]
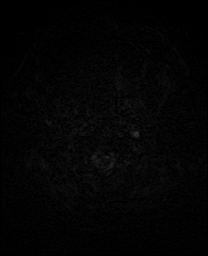
[im 15/60]
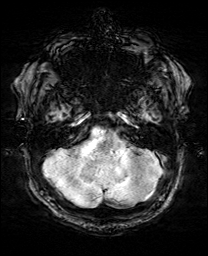
[im 30/60]
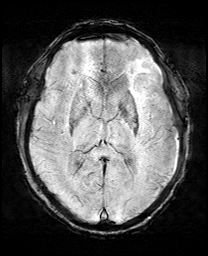
[im 45/60]
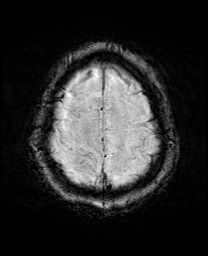
[im 60/60]
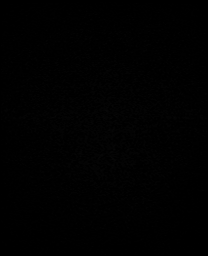

[Series 15: FLAIR · axial · 3.0mm · 0.45mm/px · z∈[-85,+38]mm · 3 of 42 slices shown]
[im 1/42]
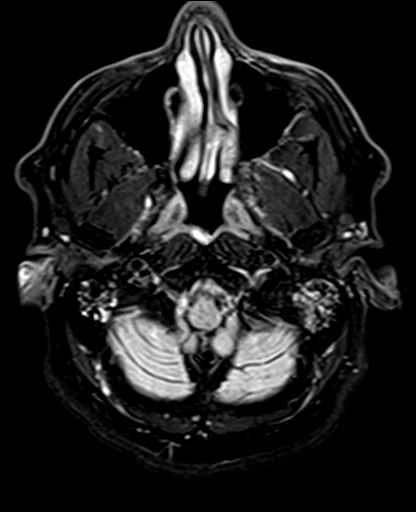
[im 21/42]
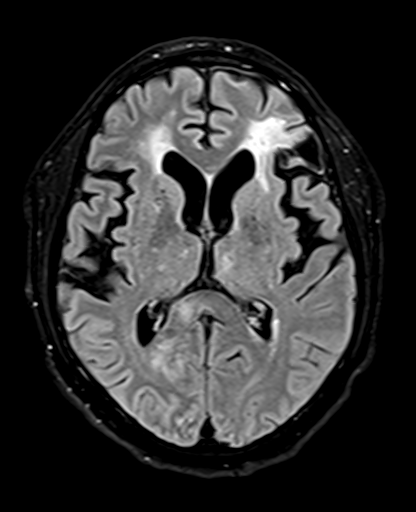
[im 42/42]
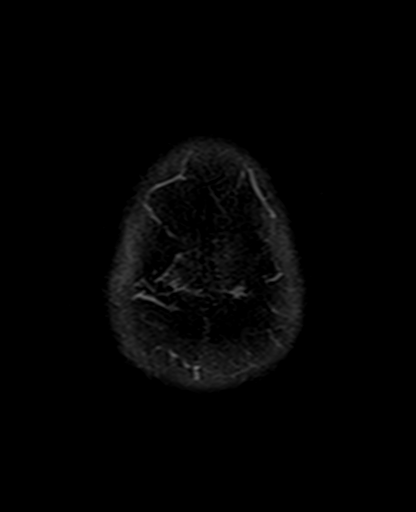

[Series 17: T2 · coronal · 5.0mm · 0.72mm/px · 2 of 30 slices shown (2 of 2)]
[im 1/30]
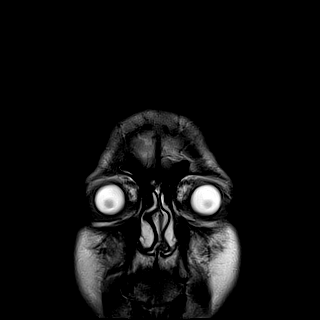
[im 30/30]
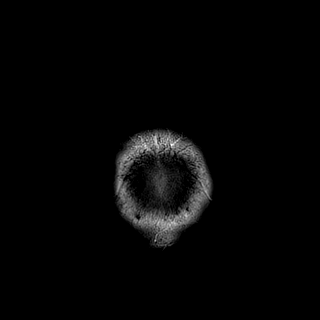

[36 of 48 positions shown; findings below may reference images not displayed]

FINDINGS: Brain: There is a new acute left lateral lenticulostriate territory
infarct extending from the posterior aspect of the left lentiform
nucleus to the caudate body and involving the intervening deep white
matter. There is residual diffusion weighted signal abnormality at
the sites of the posterior circulation infarcts on the prior MRI
(right temporal and occipital lobes, splenium of the corpus
callosum, and left pons) without evidence of significant interval
infarct extension. There is minimal petechial hemorrhage in the
right occipital lobe.

A chronic microhemorrhage is noted in the anterior right frontal
lobe. Chronic lacunar infarcts are again noted involving the deep
cerebral white matter bilaterally, basal ganglia, and thalami, and
there is a chronic moderate-sized left MCA infarct near the anterior
aspect of the left frontal operculum. T2 hyperintensities elsewhere
in the cerebral white matter bilaterally are unchanged and
nonspecific but compatible with moderate chronic small vessel
ischemic disease. No mass, midline shift, or extra-axial fluid
collection is identified. There is mild cerebral atrophy.

Vascular: Major intracranial vascular flow voids are preserved.

Skull and upper cervical spine: Unremarkable bone marrow signal.

Sinuses/Orbits: Unremarkable orbits. Minimal mucosal thickening or
small mucous retention cyst in left sphenoid sinus. Small right and
moderate left mastoid effusions.

Other: None.
IMPRESSION: 1. Acute left basal ganglia infarct.
2. Evolving subacute posterior circulation infarcts without
extension from the [DATE] MRI.
3. Moderate chronic small vessel ischemic disease with multiple old
infarcts.

## 2020-09-15 IMAGING — MR MR MRA HEAD W/O CM
1 series · 32 of 48 positions shown · non-contrast
Comparison: MRI head [DATE].  CT a [DATE]

CLINICAL DATA: Stroke

EXAM:
MRA HEAD WITHOUT CONTRAST
TECHNIQUE: Angiographic images of the Circle of Willis were acquired using MRA
technique without intravenous contrast.

[Series 2: TOF · axial · 0.5mm · 0.54mm/px · z∈[-91,-2]mm · 32 of 200 slices shown]
[im 1/200]
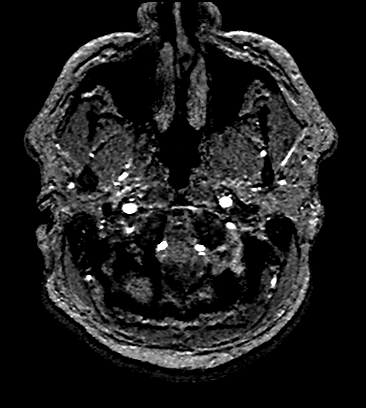
[im 5/200]
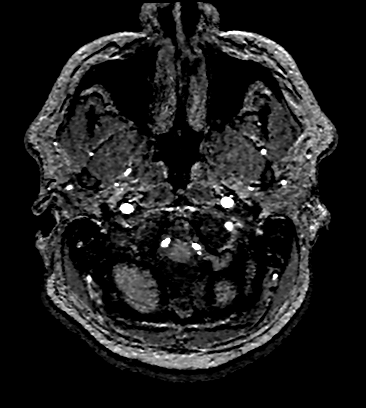
[im 9/200]
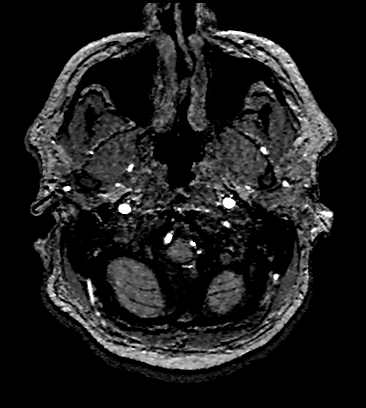
[im 13/200]
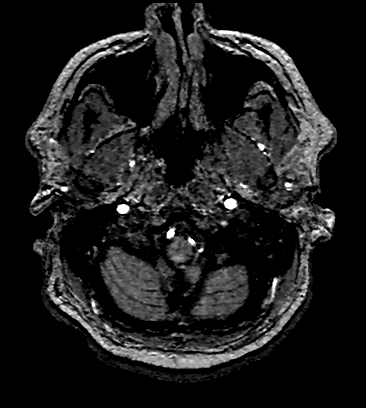
[im 17/200]
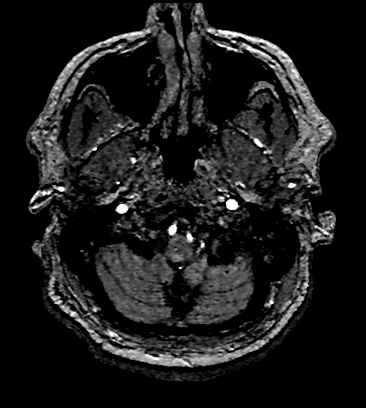
[im 22/200]
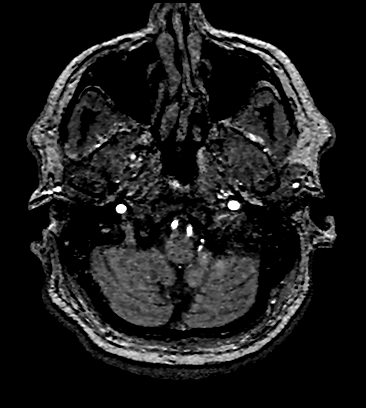
[im 26/200]
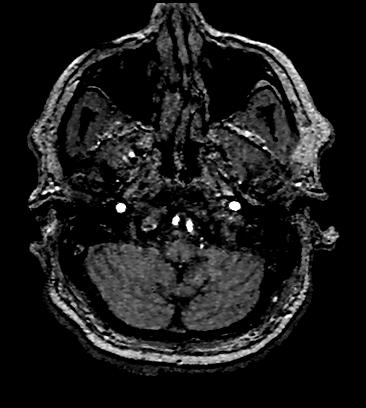
[im 30/200]
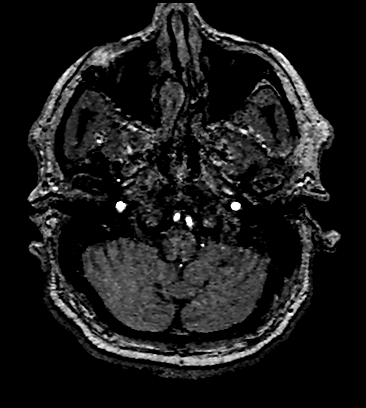
[im 34/200]
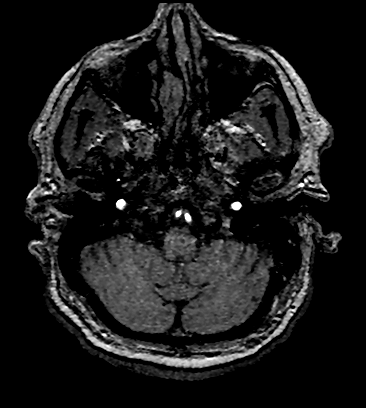
[im 39/200]
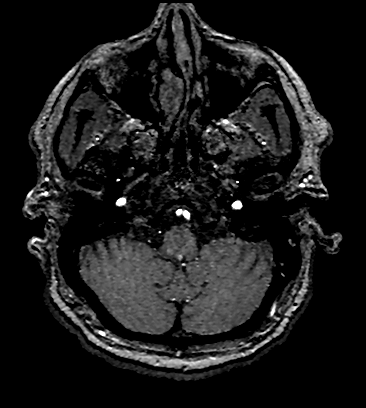
[im 43/200]
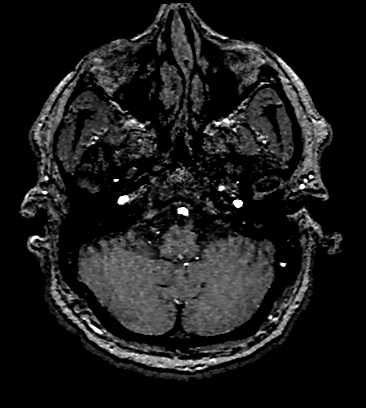
[im 47/200]
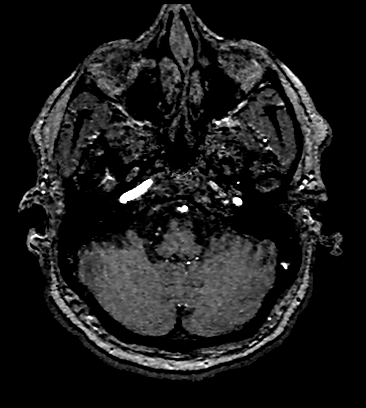
[im 51/200]
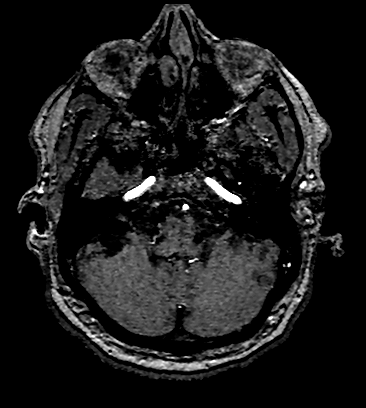
[im 56/200]
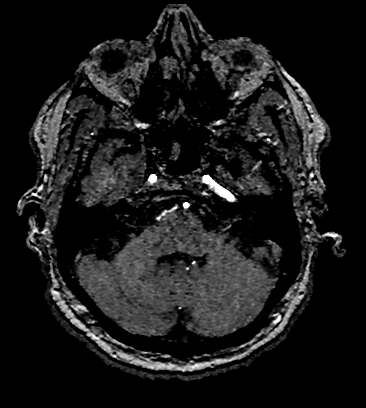
[im 60/200]
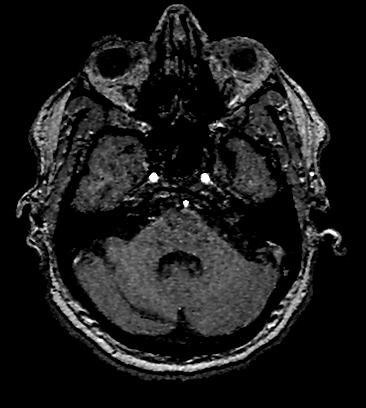
[im 64/200]
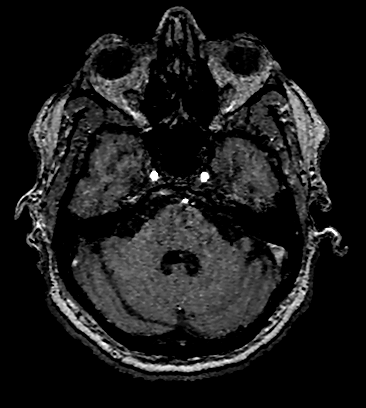
[im 68/200]
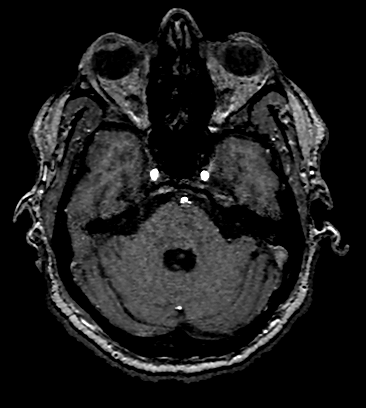
[im 72/200]
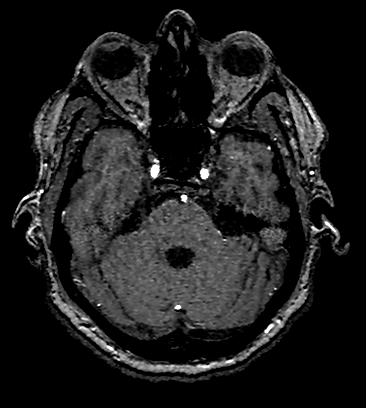
[im 77/200]
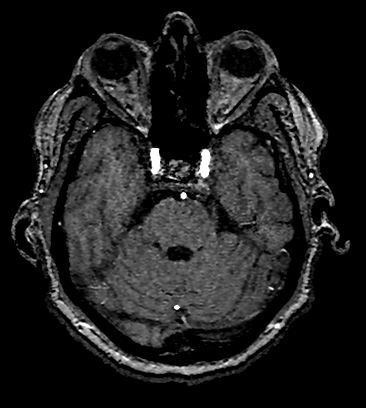
[im 81/200]
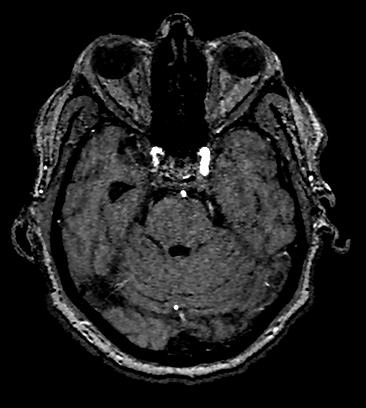
[im 85/200]
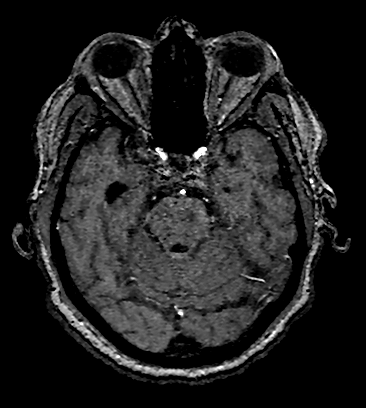
[im 89/200]
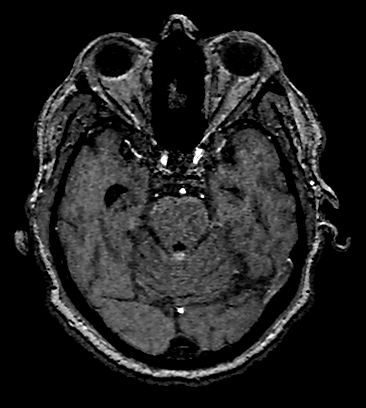
[im 94/200]
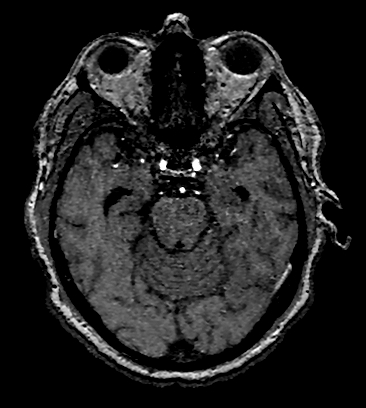
[im 98/200]
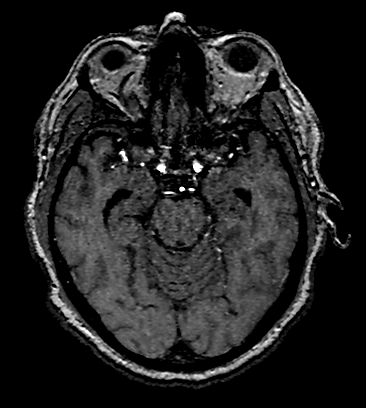
[im 102/200]
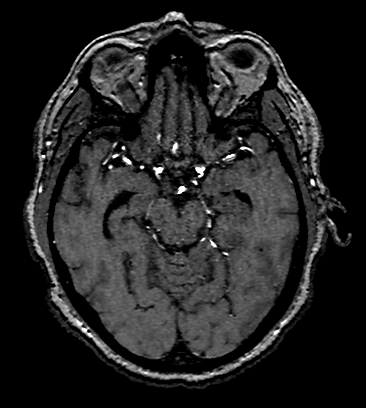
[im 106/200]
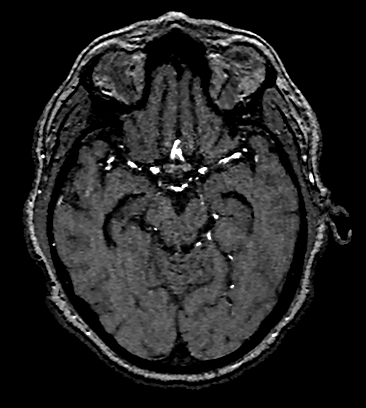
[im 111/200]
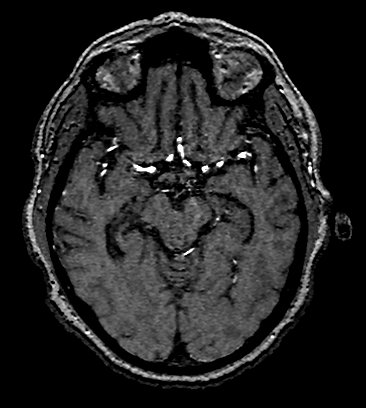
[im 115/200]
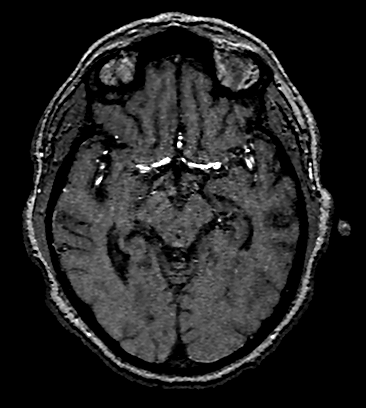
[im 140/200]
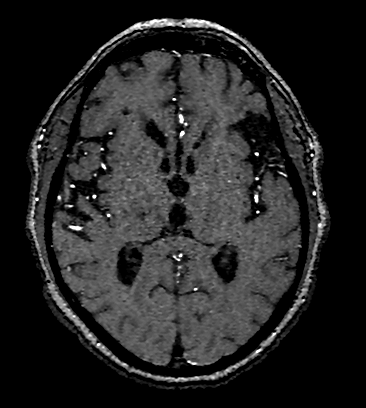
[im 166/200]
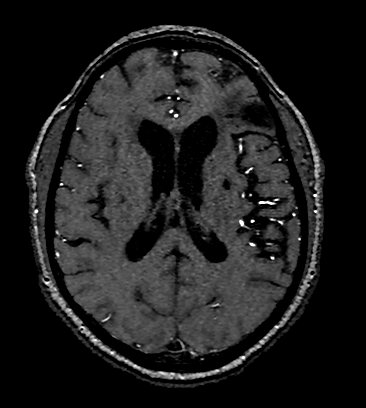
[im 170/200]
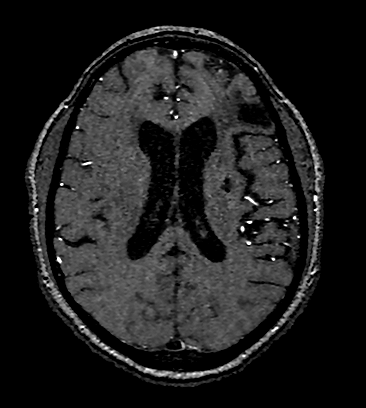
[im 191/200]
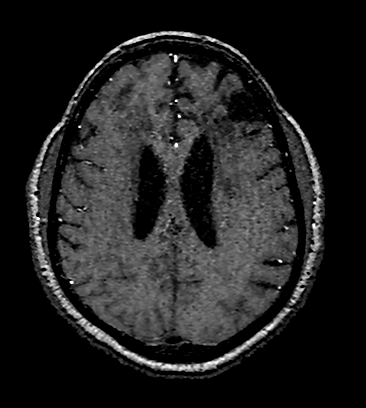

[32 of 48 positions shown; findings below may reference images not displayed]

FINDINGS: Anterior circulation: Motion degraded study. This significantly
limits evaluation for small vessel disease.

Internal carotid artery patent bilaterally without significant
stenosis. Irregular areas of stenosis in the anterior and middle
cerebral artery branches bilaterally consistent with widespread
atherosclerotic disease. This is confirmed on the prior CTA. No
large vessel occlusion.

Posterior circulation: Both vertebral arteries are patent to the
basilar. Basilar patent. Superior cerebellar arteries patent
bilaterally.

Occlusion of the proximal right posterior cerebral artery
corresponding to right PCA infarct on MRI. There is severe stenosis
of the left posterior cerebral artery which has distal flow. This
appears progressive from the prior CT angiogram. Differences could
be due to artifact from motion.

Anatomic variants: None

Other: None
IMPRESSION: Motion degraded study

Diffuse intracranial atherosclerotic disease, moderate to severe

Occlusion right PCA. Severe stenosis left PCA with distal flow. This
may have progressed since prior studies however is difficult to
compare given the amount of motion on the MRA.

## 2020-09-15 IMAGING — DX DG CHEST 1V PORT
1 series · 1 of 1 positions shown · non-contrast
Comparison: Chest x-ray [DATE], CT chest [DATE]

CLINICAL DATA: Pt has history of 4 previous strokes since Dec. Pt
typically has SOB, but worsened yesterday. Hx of COPD, diabetes,
HTN. Current smoker.

EXAM:
PORTABLE CHEST 1 VIEW

[chest ap]
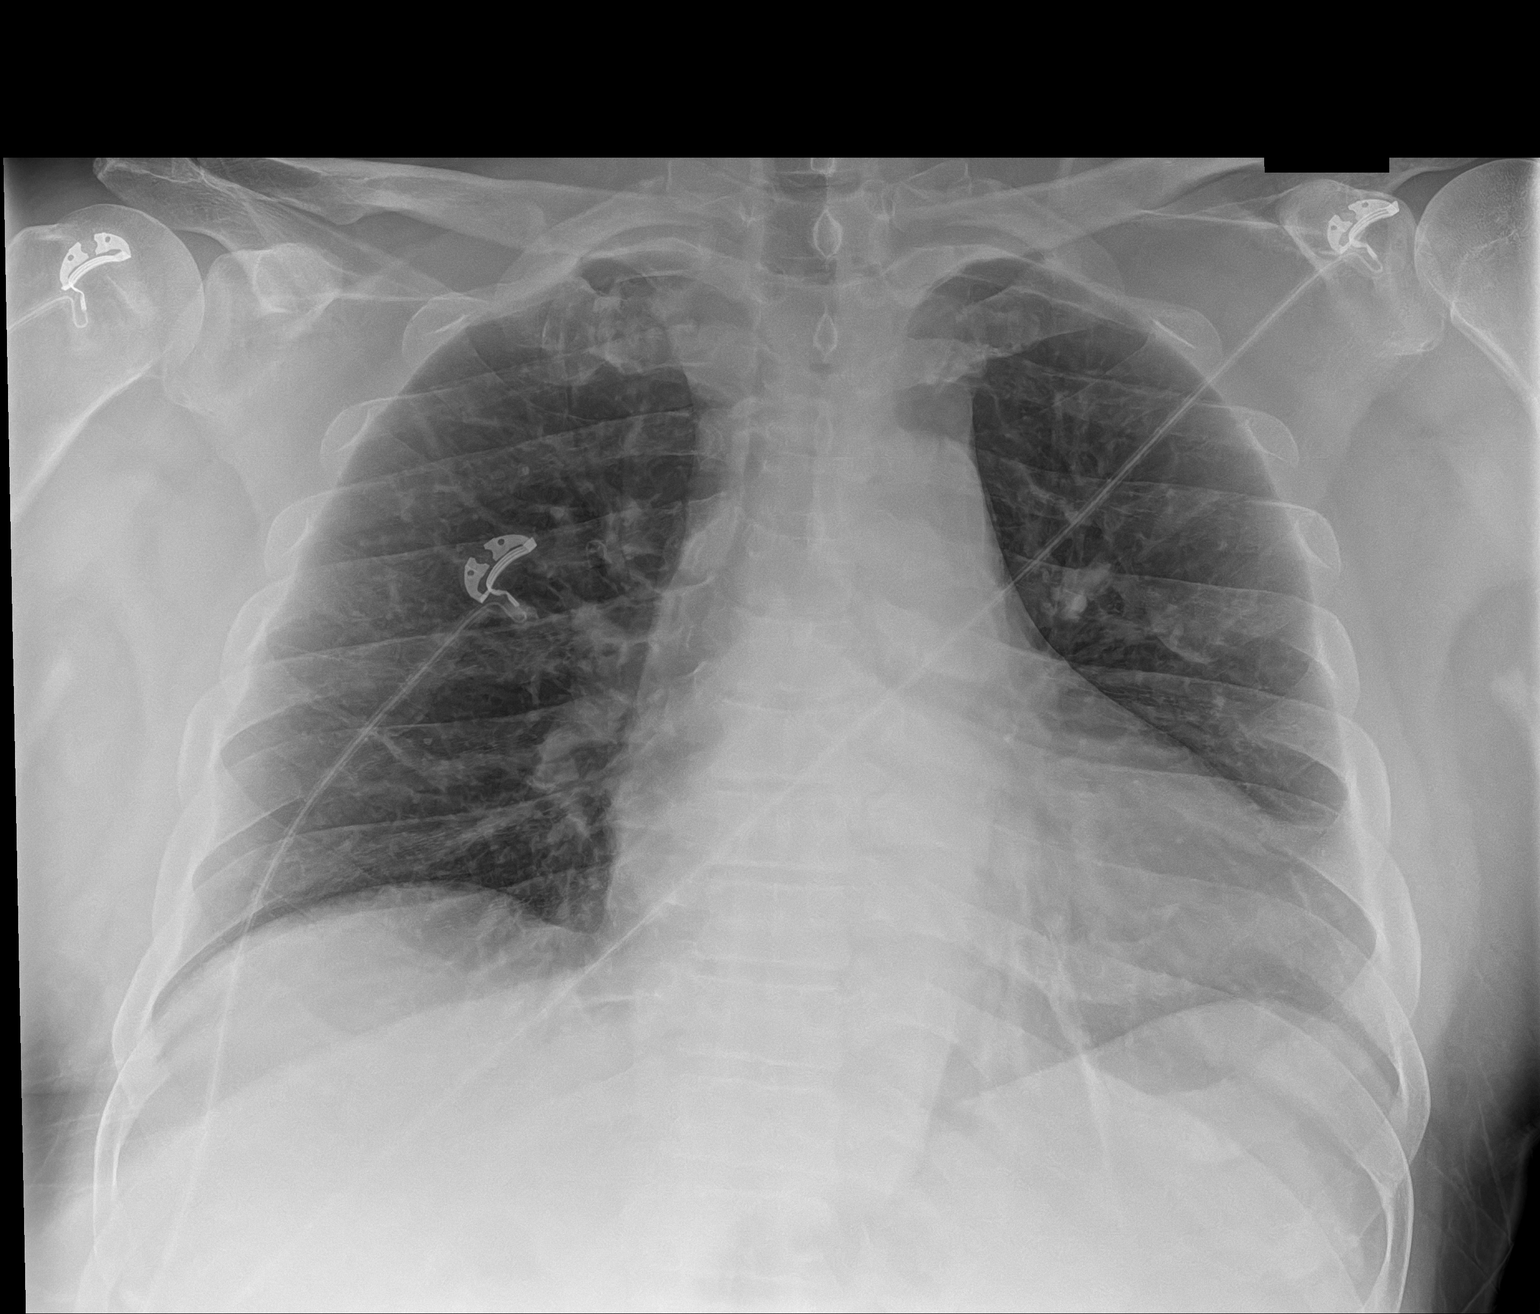

[1 of 1 positions shown; findings below may reference images not displayed]

FINDINGS: Enlarged cardiac silhouette. The heart size and mediastinal contours
are unchanged.

No focal consolidation. No pulmonary edema. No pleural effusion. No
pneumothorax.

No acute osseous abnormality.
IMPRESSION: No active disease.

## 2020-09-15 MED ORDER — STROKE: EARLY STAGES OF RECOVERY BOOK
Freq: Once | Status: AC
  Filled 2020-09-15: qty 1

## 2020-09-15 MED ORDER — ALBUTEROL SULFATE HFA 108 (90 BASE) MCG/ACT IN AERS: 2.0000 | INHALATION_SPRAY | Freq: Four times a day (QID) | RESPIRATORY_TRACT | Status: DC | PRN

## 2020-09-15 MED ORDER — IPRATROPIUM-ALBUTEROL 20-100 MCG/ACT IN AERS
2.0000 | INHALATION_SPRAY | Freq: Four times a day (QID) | RESPIRATORY_TRACT | Status: DC
  Administered 2020-09-15 (×2): 2 via RESPIRATORY_TRACT
  Filled 2020-09-15: qty 4

## 2020-09-15 MED ORDER — INSULIN ASPART 100 UNIT/ML IJ SOLN: 0.0000 [IU] | Freq: Every day | INTRAMUSCULAR | Status: DC

## 2020-09-15 MED ORDER — ASPIRIN 81 MG PO CHEW
241.0000 mg | CHEWABLE_TABLET | Freq: Once | ORAL | Status: AC
  Administered 2020-09-15: 241 mg via ORAL
  Filled 2020-09-15: qty 3

## 2020-09-15 MED ORDER — METHYLPREDNISOLONE SODIUM SUCC 125 MG IJ SOLR
125.0000 mg | Freq: Once | INTRAMUSCULAR | Status: AC
  Administered 2020-09-15: 125 mg via INTRAVENOUS
  Filled 2020-09-15: qty 2

## 2020-09-15 MED ORDER — UMECLIDINIUM-VILANTEROL 62.5-25 MCG/INH IN AEPB
1.0000 | INHALATION_SPRAY | Freq: Every day | RESPIRATORY_TRACT | Status: DC
  Administered 2020-09-16 – 2020-09-19 (×4): 1 via RESPIRATORY_TRACT
  Filled 2020-09-15: qty 14

## 2020-09-15 MED ORDER — IPRATROPIUM-ALBUTEROL 0.5-2.5 (3) MG/3ML IN SOLN
3.0000 mL | Freq: Four times a day (QID) | RESPIRATORY_TRACT | Status: DC
  Administered 2020-09-16 (×2): 3 mL via RESPIRATORY_TRACT
  Filled 2020-09-15 (×2): qty 3

## 2020-09-15 MED ORDER — ACETAMINOPHEN 160 MG/5ML PO SOLN: 650.0000 mg | ORAL | Status: DC | PRN

## 2020-09-15 MED ORDER — ATORVASTATIN CALCIUM 80 MG PO TABS
80.0000 mg | ORAL_TABLET | Freq: Every day | ORAL | Status: DC
  Administered 2020-09-16 – 2020-09-19 (×4): 80 mg via ORAL
  Filled 2020-09-15 (×4): qty 1

## 2020-09-15 MED ORDER — ACETAMINOPHEN 650 MG RE SUPP: 650.0000 mg | RECTAL | Status: DC | PRN

## 2020-09-15 MED ORDER — FLUTICASONE-UMECLIDIN-VILANT 100-62.5-25 MCG/INH IN AEPB: 1.0000 | INHALATION_SPRAY | Freq: Every day | RESPIRATORY_TRACT | Status: DC

## 2020-09-15 MED ORDER — TICAGRELOR 90 MG PO TABS
90.0000 mg | ORAL_TABLET | Freq: Two times a day (BID) | ORAL | Status: DC
  Administered 2020-09-16 – 2020-09-19 (×6): 90 mg via ORAL
  Filled 2020-09-15 (×6): qty 1

## 2020-09-15 MED ORDER — IPRATROPIUM-ALBUTEROL 0.5-2.5 (3) MG/3ML IN SOLN
3.0000 mL | RESPIRATORY_TRACT | Status: DC | PRN
  Administered 2020-09-17: 3 mL via RESPIRATORY_TRACT
  Filled 2020-09-15: qty 3

## 2020-09-15 MED ORDER — ENOXAPARIN SODIUM 40 MG/0.4ML IJ SOSY
40.0000 mg | PREFILLED_SYRINGE | INTRAMUSCULAR | Status: DC
  Administered 2020-09-16 – 2020-09-19 (×4): 40 mg via SUBCUTANEOUS
  Filled 2020-09-15 (×4): qty 0.4

## 2020-09-15 MED ORDER — ACETAMINOPHEN 325 MG PO TABS
650.0000 mg | ORAL_TABLET | ORAL | Status: DC | PRN
  Administered 2020-09-18: 650 mg via ORAL
  Filled 2020-09-15 (×2): qty 2

## 2020-09-15 MED ORDER — ASPIRIN EC 81 MG PO TBEC
81.0000 mg | DELAYED_RELEASE_TABLET | Freq: Every day | ORAL | Status: DC
  Administered 2020-09-16 – 2020-09-19 (×4): 81 mg via ORAL
  Filled 2020-09-15 (×4): qty 1

## 2020-09-15 MED ORDER — INSULIN ASPART 100 UNIT/ML IJ SOLN
0.0000 [IU] | Freq: Three times a day (TID) | INTRAMUSCULAR | Status: DC
  Administered 2020-09-16 (×3): 1 [IU] via SUBCUTANEOUS

## 2020-09-15 NOTE — ED Notes (Signed)
Pt to mri 

## 2020-09-15 NOTE — ED Notes (Signed)
Report called to RN Hassan Rowan, Care link at bedside, pt ready for transport

## 2020-09-15 NOTE — ED Notes (Signed)
Pt in bed, care link at bedside, pt denies pain, pt states that he has no needs before transport, pt has increased strength in his R leg, now would score a one for minor drift, no drift in arms.  Pt from dpt.

## 2020-09-15 NOTE — Consult Note (Signed)
NEUROLOGY CONSULTATION NOTE   Date of service: September 15, 2020 Patient Name: Craig Knight MRN:  [id number] DOB:  06/22/1965 Reason for consult: "thalamic stroke" Requesting Provider: Albertine Patricia, MD _ _ _   _ __   _ __ _ _  __ __   _ __   __ _  History of Present Illness  Craig Knight is a 55 y.o. male with PMH significant for DM2, COPD, HTN, OSA, recent large R PCA stroke and left pontine stroke last week with residual L hemianopsia and mild right sided weakness and right facial droop with unclear stroke etiology who presented to [facility] ED with shortness of breath and worsening R sided weakness. He had an MRI Brain which demonstrated new acute left basal ganglia stroke and evolving R PCA stroke.  He was transferred to [facility] for further evaluation.  Reports that he was going to his Pt appointment on 09/13/20 when he had worsening of his R leg weakness. He was dragging his R foot and had this intuition that he was having a new stroke. He came to the ED. Reports that he always has mild shortness of breath and this was not worse. He stopped smokingand has not had any smoked since discharge last week.  mRS:1 tPA/thrombectomy: outside window NIHSS components Score: Comment  1a Level of Conscious 0'[x]'$  1'[]'$  2'[]'$  3'[]'$      1b LOC Questions 0'[x]'$  1'[]'$  2'[]'$       1c LOC Commands 0'[x]'$  1'[]'$  2'[]'$       2 Best Gaze 0'[x]'$  1'[]'$  2'[]'$       3 Visual 0'[]'$  1'[]'$  2'[x]'$  3'[]'$    L hemianopsia.  4 Facial Palsy 0'[]'$  1'[x]'$  2'[]'$  3'[]'$      5a Motor Arm - left 0'[x]'$  1'[]'$  2'[]'$  3'[]'$  4'[]'$  UN'[]'$    5b Motor Arm - Right 0'[x]'$  1'[]'$  2'[]'$  3'[]'$  4'[]'$  UN'[]'$    6a Motor Leg - Left 0'[x]'$  1'[]'$  2'[]'$  3'[]'$  4'[]'$  UN'[]'$    6b Motor Leg - Right 0'[x]'$  1'[]'$  2'[]'$  3'[]'$  4'[]'$  UN'[]'$    7 Limb Ataxia 0'[x]'$  1'[]'$  2'[]'$  3'[]'$  UN'[]'$     8 Sensory 0'[x]'$  1'[]'$  2'[]'$  UN'[]'$      9 Best Language 0'[x]'$  1'[]'$  2'[]'$  3'[]'$      10 Dysarthria 0'[]'$  1'[x]'$  2'[]'$  UN'[]'$      11 Extinct. and Inattention 0'[x]'$  1'[]'$  2'[]'$       TOTAL: 4     ROS   Constitutional Denies weight loss, fever and chills.   HEENT Denies changes in  vision and hearing.   Respiratory Mild SOB but no cough.   CV Denies palpitations and CP   GI Denies abdominal pain, nausea, vomiting and diarrhea.   GU Denies dysuria and urinary frequency.   MSK Denies myalgia and joint pain.   Skin Denies rash and pruritus.   Neurological Denies headache and syncope.   Psychiatric Denies recent changes in mood. Denies anxiety and depression.    Past History   Past Medical History:  Diagnosis Date   Asthma    Bronchitis    COPD (chronic obstructive pulmonary disease) ([place])    Diabetes mellitus without complication ([place])    Heart murmur [id number]   soft systolic murmur 1/6   Hypertension    Sleep apnea    Stroke [facility])    Past Surgical History:  Procedure Laterality Date   COLONOSCOPY N/A 09/18/2016   Procedure: COLONOSCOPY;  Surgeon: Danie Binder, MD;  Location: [facility];  Service: Endoscopy;  Laterality: N/A;  10:30 AM   lipoma removal  Family History  Problem Relation Age of Onset   Stroke Mother    Heart attack Father 34       CABG   Hypertension Sister    Stroke Brother    Hypertension Brother    Stomach cancer Brother    Stroke Maternal Grandmother    Heart attack Maternal Grandfather    Heart attack Paternal Grandmother    Social History   Socioeconomic History   Marital status: Married    Spouse name: Not on file   Number of children: Not on file   Years of education: Not on file   Highest education level: Not on file  Occupational History   Not on file  Tobacco Use   Smoking status: Every Day    Packs/day: 0.25    Types: Cigarettes   Smokeless tobacco: Never  Vaping Use   Vaping Use: Never used  Substance and Sexual Activity   Alcohol use: Yes    Alcohol/week: 0.0 standard drinks    Comment: occasionally   Drug use: No   Sexual activity: Yes    Birth control/protection: None  Other Topics Concern   Not on file  Social History Narrative   Not on file   Social Determinants of Health    Financial Resource Strain: Not on file  Food Insecurity: Not on file  Transportation Needs: Not on file  Physical Activity: Not on file  Stress: Not on file  Social Connections: Not on file   Allergies  Allergen Reactions   Dust Mite Extract     Sneezing   Lisinopril Cough    Medications   Medications Prior to Admission  Medication Sig Dispense Refill Last Dose   albuterol (VENTOLIN HFA) 108 (90 Base) MCG/ACT inhaler Inhale 2 puffs into the lungs every 6 (six) hours as needed for wheezing or shortness of breath. 18 g 8 09/15/2020   aspirin EC 81 MG tablet Take 1 tablet (81 mg total) by mouth daily. 30 tablet 0 09/15/2020   atorvastatin (LIPITOR) 80 MG tablet Take 1 tablet (80 mg total) by mouth daily. 90 tablet 1 09/15/2020   chlorthalidone (HYGROTON) 25 MG tablet Take 1 tablet (25 mg total) by mouth daily. 30 tablet 0 09/15/2020   [START ON 10/09/2020] clopidogrel (PLAVIX) 75 MG tablet Take 1 tablet (75 mg total) by mouth daily.   09/15/2020 at 0930   Fluticasone-Umeclidin-Vilant (TRELEGY ELLIPTA) 100-62.5-25 MCG/INH AEPB Inhale 1 puff into the lungs daily. 60 each 8 09/15/2020   gabapentin (NEURONTIN) 300 MG capsule Take 300 mg by mouth at bedtime.   09/14/2020   glipiZIDE (GLUCOTROL) 5 MG tablet Take 1 tablet (5 mg total) by mouth daily. 180 tablet 1 09/15/2020   hydrALAZINE (APRESOLINE) 50 MG tablet Take 50 mg by mouth 2 (two) times daily.   09/15/2020   ipratropium-albuterol (DUONEB) 0.5-2.5 (3) MG/3ML SOLN Take 3 mLs by nebulization every 4 (four) hours as needed. 360 mL 6 09/14/2020   isosorbide mononitrate (IMDUR) 60 MG 24 hr tablet Take 1 tablet (60 mg total) by mouth daily. 30 tablet 0 09/15/2020   losartan (COZAAR) 100 MG tablet Take 1 tablet (100 mg total) by mouth daily.   09/15/2020   metFORMIN (GLUCOPHAGE) 500 MG tablet Take 1 tablet (500 mg total) by mouth 2 (two) times daily with a meal. 180 tablet 0 09/15/2020   nitroGLYCERIN (NITROSTAT) 0.4 MG SL tablet Place 1 tablet (0.4 mg  total) under the tongue every 5 (five) minutes as needed for chest pain. [place]  tablet 0    NON FORMULARY Pt uses cpap nightly   09/14/2020   ticagrelor (BRILINTA) 90 MG TABS tablet Take 1 tablet (90 mg total) by mouth 2 (two) times daily. Start Plavix when you complete this for one month 60 tablet 0 09/15/2020   traZODone (DESYREL) 100 MG tablet Take 100 mg by mouth at bedtime.        Vitals   Vitals:   09/15/20 1948 09/15/20 2030 09/15/20 2106 09/15/20 2156  BP: (!) 168/77 (!) 174/85 (!) 170/94 (!) 166/88  Pulse: 91  96 77  Resp: (!) 21 (!) '21 18 18  '$ Temp: 98.5 F (36.9 C)  98.4 F (36.9 C) 98.4 F (36.9 C)  TempSrc: Oral  Oral Oral  SpO2: 97%  97% 98%  Weight:      Height:         Body mass index is 35.24 kg/m.  Physical Exam   General: Laying comfortably in bed; in no acute distress.  HENT: Normal oropharynx and mucosa. Normal external appearance of ears and nose.  Neck: Supple, no pain or tenderness  CV: No JVD. No peripheral edema.  Pulmonary: Symmetric Chest rise. Normal respiratory effort.  Abdomen: Soft to touch, non-tender.  Ext: No cyanosis, edema, or deformity  Skin: No rash. Normal palpation of skin.   Musculoskeletal: Normal digits and nails by inspection. No clubbing.   Neurologic Examination  Mental status/Cognition: Alert, oriented to self, place, month and year, good attention.  Speech/language: mildly dysarthric, Fluent, comprehension intact, object naming intact, repetition intact.  Cranial nerves:   CN II Pupils equal and reactive to light, left hemianopsia.   CN III,IV,VI EOM intact, no gaze preference or deviation, no nystagmus    CN V normal sensation in V1, V2, and V3 segments bilaterally   CN VII Mild R facial droop that improves with smiling.   CN VIII normal hearing to speech    CN IX & X normal palatal elevation, no uvular deviation    CN XI 5/5 head turn and 5/5 shoulder shrug bilaterally    CN XII midline tongue protrusion    Motor:  Muscle  bulk: normal, tone normal, pronator drift RUE drift. tremor none Mvmt Root Nerve  Muscle Right Left Comments  SA C5/6 Ax Deltoid 4 5   EF C5/6 Mc Biceps 4+ 5   EE C6/7/8 Rad Triceps 4+ 5   WF C6/7 Med FCR     WE C7/8 PIN ECU     F Ab C8/T1 U ADM/FDI 4+ 5   HF L1/2/3 Fem Illopsoas 4+ 5   KE L2/3/4 Fem Quad 5 5   DF L4/5 D Peron Tib Ant 4+ 5   PF S1/2 Tibial Grc/Sol 5 5    Reflexes:  Right Left Comments  Pectoralis      Biceps (C5/6) 2 2   Brachioradialis (C5/6) 2 2    Triceps (C6/7) 2 2    Patellar (L3/4) 3 3    Achilles (S1) 2 2    Hoffman      Plantar     Jaw jerk    Sensation:  Light touch intact   Pin prick    Temperature    Vibration   Proprioception    Coordination/Complex Motor:  - Finger to Nose intact - Heel to shin mild ataxia in RLE. - Rapid alternating movement are slowed in RUE. - Gait: Deferred.  Labs   CBC:  Recent Labs  Lab 09/13/20 1949 09/15/20 1348  WBC 14.4*  12.8*  NEUTROABS 11.5* 10.6*  HGB 14.0 14.9  HCT 42.7 43.9  MCV 81.0 80.6  PLT 258 [id number]    Basic Metabolic Panel:  Lab Results  Component Value Date   NA 137 09/15/2020   K 3.5 09/15/2020   CO2 20 (L) 09/15/2020   GLUCOSE 88 09/15/2020   BUN 33 (H) 09/15/2020   CREATININE 2.01 (H) 09/15/2020   CALCIUM 9.7 09/15/2020   GFRNONAA 38 (L) 09/15/2020   GFRAA 48 (L) 11/23/2018   Lipid Panel:  Lab Results  Component Value Date   LDLCALC 108 (H) 09/07/2020   HgbA1c:  Lab Results  Component Value Date   HGBA1C 7.6 (H) 09/07/2020   Urine Drug Screen:     Component Value Date/Time   LABOPIA NONE DETECTED 09/07/2020 1803   COCAINSCRNUR NONE DETECTED 09/07/2020 1803   LABBENZ NONE DETECTED 09/07/2020 1803   AMPHETMU NONE DETECTED 09/07/2020 1803   THCU NONE DETECTED 09/07/2020 1803   LABBARB NONE DETECTED 09/07/2020 1803    Alcohol Level     Component Value Date/Time   ETH <10 09/06/2020 1630    CT Head without contrast(personally reviewed): Evolving R PCA and left  pontine infarct.  MR Angio head without contrast and Carotid Duplex BL: Diffuse intracranial atherosclerotic disease, moderate to severe   Occlusion right PCA. Severe stenosis left PCA with distal flow. This may have progressed since prior studies however is difficult to compare given the amount of motion on the MRA.  MRI Brain(personally reviewed): 1. Acute left basal ganglia infarct. 2. Evolving subacute posterior circulation infarcts without extension from the 09/07/2020 MRI. 3. 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  Impression   SYLVAN GRIMARD is a 55 y.o. male with PMH significant for DM2, COPD, HTN, OSA, recent large R PCA stroke and left pontine stroke last week with residual L hemianopsia and mild right sided weakness and right facial droop with unclear stroke etiology who presented to [facility] ED with shortness of breath and worsening R sided weakness. He had an MRI Brain which demonstrated new acute left basal ganglia stroke and evolving R PCA stroke. His neurologic examination is notable for R sided weakness and Left hemianopsia.  Unclear etiology of stroke on workup last time with TEE and Loop recorder pending. Could be large/small vessel disease vs cardiomebolic.   Primary Diagnosis:  Cerebral infarction, unspecified.  Secondary Diagnosis: Essential (primary) hypertension, Heart failure, unspecified, Type 2 diabetes mellitus w/o complications, and Obesity  Recommendations   Plan:  - Frequent Neuro checks per stroke unit protocol - - MRA completed, Vasc US carotid duplex is pending. - Recommend obtaining limited TTE with bubble study. Will need TEE. - Recommend obtaining Lipid panel with LDL - Please start statin if LDL > 70 - Recommend HbA1c - Antithrombotic -aspirin '81mg'$  daily along with Brilinta '90mg'$  BID for 30 days, followed by aspirin '81mg'$  daily and plavix '75mg'$  daily. - Recommend DVT ppx - SBP goal - permissive  hypertension first 24 h < 220/110. Held home meds.  - Recommend Telemetry monitoring for arrythmia - Recommend bedside swallow screen prior to PO intake. - Stroke education booklet - Recommend PT/OT/SLP consult   ______________________________________________________________________   Thank you for the opportunity to take part in the care of this patient. If you have any further questions, please contact the neurology consultation attending.  Signed,  [place] Pager Number [phone] _ _ _   _ __   _ __ _ _  __ __   _ __   __ _  

## 2020-09-15 NOTE — H&P (Signed)
TRH H&P   Patient Demographics:    Craig Knight, is a 55 y.o. male  MRN: [id number]   DOB - [birth year]  Admit Date - 09/15/2020  Outpatient Primary MD for the patient is Rosita Fire, MD  Referring MD/NP/PA: PA Williams  Patient coming from: Home  Chief Complaint  Patient presents with   Shortness of Breath      HPI:    Craig Knight  is a 55 y.o. male, HTN, DM-2, diastolic CHF, [id number], COPD , with recent hospitalization 7/13 with acute right PCA territory infarct with residual mild right side weakness,, left hemianopsia, right facial droop, patient presents to ED initially with complaints of shortness of breath, as well he does report worsening right side weakness, but report it had mainly worsened this morning, patient received Solu-Medrol, nebulizer treatment, currently denies any respiratory symptoms, denies any recent falls, headache, change in vision, patient report he is compliant with his medication including statin, aspirin and Brilinta, he denies any fever, chills, or cough. - in ED his work-up significant for creatinine of 2 around his baseline, high-sensitivity troponin of 33, he denies any chest pain, he has mild leukocytosis at 12.8, he is afebrile, he had MRI brain/MRA head and neck significant for diffuse intracranial atherosclerosis disease, moderate to severe, with occlusion of right PCA, severe stenosis of left PCA, MRI of brain showing acute left basal ganglia infarct, and evolving subacute posterior circulation infarct without extension, ED physician discussed with neurology at [facility] Dr. Leonel Ramsay who requested admission for work-up at [facility] as no neurology coverage at [facility].    Review of systems:    In addition to the HPI above, No Fever-chills, No Headache, No changes with Vision or hearing, No problems swallowing food or  Liquids, No Chest pain, Cough , he does report mild shortness of breath No Abdominal pain, No Nausea or Vommitting, Bowel movements are regular, No Blood in stool or Urine, No dysuria, No new skin rashes or bruises, No new joints pains-aches,  Reports worsening right side weakness No recent weight gain or loss, No polyuria, polydypsia or polyphagia, No significant Mental Stressors.  A full 10 point Review of Systems was done, except as stated above, all other Review of Systems were negative.   With Past History of the following :    Past Medical History:  Diagnosis Date   Asthma    Bronchitis    COPD (chronic obstructive pulmonary disease) ([place])    Diabetes mellitus without complication ([place])    Heart murmur [id number]   soft systolic murmur 1/6   Hypertension    Sleep apnea    Stroke [facility])       Past Surgical History:  Procedure Laterality Date   COLONOSCOPY N/A 09/18/2016   Procedure: COLONOSCOPY;  Surgeon: Danie Binder, MD;  Location: [facility];  Service: Endoscopy;  Laterality: N/A;  10:30  AM   lipoma removal        Social History:     Social History   Tobacco Use   Smoking status: Every Day    Packs/day: 0.25    Types: Cigarettes   Smokeless tobacco: Never  Substance Use Topics   Alcohol use: Yes    Alcohol/week: 0.0 standard drinks    Comment: occasionally        Family History :     Family History  Problem Relation Age of Onset   Stroke Mother    Heart attack Father 28       CABG   Hypertension Sister    Stroke Brother    Hypertension Brother    Stomach cancer Brother    Stroke Maternal Grandmother    Heart attack Maternal Grandfather    Heart attack Paternal Grandmother      Home Medications:   Prior to Admission medications   Medication Sig Start Date End Date Taking? Authorizing Provider  albuterol (VENTOLIN HFA) 108 (90 Base) MCG/ACT inhaler Inhale 2 puffs into the lungs every 6 (six) hours as needed for wheezing or  shortness of breath. 09/11/20  Yes Mannam, Praveen, MD  aspirin EC 81 MG tablet Take 1 tablet (81 mg total) by mouth daily. 04/16/18  Yes Carmin Muskrat, MD  atorvastatin (LIPITOR) 80 MG tablet Take 1 tablet (80 mg total) by mouth daily. 09/08/20  Yes Mercy Riding, MD  chlorthalidone (HYGROTON) 25 MG tablet Take 1 tablet (25 mg total) by mouth daily. 09/13/20 10/18/20 Yes Mercy Riding, MD  clopidogrel (PLAVIX) 75 MG tablet Take 1 tablet (75 mg total) by mouth daily. 10/09/20  Yes Mercy Riding, MD  Fluticasone-Umeclidin-Vilant (TRELEGY ELLIPTA) 100-62.5-25 MCG/INH AEPB Inhale 1 puff into the lungs daily. 09/11/20  Yes Mannam, Praveen, MD  gabapentin (NEURONTIN) 300 MG capsule Take 300 mg by mouth at bedtime. 12/10/18  Yes [provider]  glipiZIDE (GLUCOTROL) 5 MG tablet Take 1 tablet (5 mg total) by mouth daily. 09/08/20 09/08/21 Yes Mercy Riding, MD  hydrALAZINE (APRESOLINE) 50 MG tablet Take 50 mg by mouth 2 (two) times daily. 07/27/20  Yes [provider]  ipratropium-albuterol (DUONEB) 0.5-2.5 (3) MG/3ML SOLN Take 3 mLs by nebulization every 4 (four) hours as needed. 11/20/17  Yes Fenton Foy, NP  isosorbide mononitrate (IMDUR) 60 MG 24 hr tablet Take 1 tablet (60 mg total) by mouth daily. 04/16/18  Yes Carmin Muskrat, MD  losartan (COZAAR) 100 MG tablet Take 1 tablet (100 mg total) by mouth daily. 09/12/20  Yes Mercy Riding, MD  metFORMIN (GLUCOPHAGE) 500 MG tablet Take 1 tablet (500 mg total) by mouth 2 (two) times daily with a meal. 09/08/20  Yes Gonfa, Charlesetta Ivory, MD  nitroGLYCERIN (NITROSTAT) 0.4 MG SL tablet Place 1 tablet (0.4 mg total) under the tongue every 5 (five) minutes as needed for chest pain. 04/16/18  Yes Carmin Muskrat, MD  NON FORMULARY Pt uses cpap nightly   Yes [provider]  ticagrelor (BRILINTA) 90 MG TABS tablet Take 1 tablet (90 mg total) by mouth 2 (two) times daily. Start Plavix when you complete this for one month 09/08/20  Yes Gonfa, Charlesetta Ivory, MD   traZODone (DESYREL) 100 MG tablet Take 100 mg by mouth at bedtime. 09/15/20   [provider]     Allergies:     Allergies  Allergen Reactions   Dust Mite Extract     Sneezing   Lisinopril Cough  Physical Exam:   Vitals  Blood pressure (!) 137/99, pulse 88, temperature 97.7 F (36.5 C), temperature source Oral, resp. rate 14, height '5\' 7"'$  (1.702 m), weight 102.1 kg, SpO2 99 %.   1. General *developed male, laying in bed, no apparent distress  2. Normal affect and insight, Not Suicidal or Homicidal, Awake Alert, Oriented X 3.  3. No F.N deficits, ALL C.Nerves Intact, with mild right-sided facial droop, no slurred speech, right-sided weakness 3/5 in lower, pronator drift in upper ext.    4. Ears and Eyes appear Normal, Conjunctivae clear, PERRLA. Moist Oral Mucosa.  5. Supple Neck, No JVD, No cervical lymphadenopathy appriciated, No Carotid Bruits.  6. Symmetrical Chest wall movement, Good air movement bilaterally, CTAB.  7. RRR, No Gallops, Rubs or Murmurs, No Parasternal Heave.  8. Positive Bowel Sounds, Abdomen Soft, No tenderness, No organomegaly appriciated,No rebound -guarding or rigidity.  9.  No Cyanosis, Normal Skin Turgor, No Skin Rash or Bruise.  10.  joints appear normal , no effusions, Normal ROM.  11. No Palpable Lymph Nodes in Neck or Axillae    Data Review:    CBC Recent Labs  Lab 09/13/20 1949 09/15/20 1348  WBC 14.4* 12.8*  HGB 14.0 14.9  HCT 42.7 43.9  PLT 258 284  MCV 81.0 80.6  MCH 26.6 27.3  MCHC 32.8 33.9  RDW 14.2 14.2  LYMPHSABS 1.7 1.1  MONOABS 1.0 1.0  EOSABS 0.1 0.1  BASOSABS 0.1 0.0   ------------------------------------------------------------------------------------------------------------------  Chemistries  Recent Labs  Lab 09/13/20 1949 09/15/20 1348  NA 136 137  K 3.5 3.5  CL 104 106  CO2 20* 20*  GLUCOSE 98 88  BUN 31* 33*  CREATININE 2.35* 2.01*  CALCIUM 9.4 9.7  AST  --  20  ALT  --  19   ALKPHOS  --  87  BILITOT  --  0.9   ------------------------------------------------------------------------------------------------------------------ estimated creatinine clearance is 47.3 mL/min (A) (by C-G formula based on SCr of 2.01 mg/dL (H)). ------------------------------------------------------------------------------------------------------------------ No results for input(s): TSH, T4TOTAL, T3FREE, THYROIDAB in the last 72 hours.  Invalid input(s): FREET3  Coagulation profile Recent Labs  Lab 09/15/20 1348  INR 1.0   ------------------------------------------------------------------------------------------------------------------- No results for input(s): DDIMER in the last 72 hours. -------------------------------------------------------------------------------------------------------------------  Cardiac Enzymes No results for input(s): CKMB, TROPONINI, MYOGLOBIN in the last 168 hours.  Invalid input(s): CK ------------------------------------------------------------------------------------------------------------------    Component Value Date/Time   BNP 48.0 06/07/2015 0115     ---------------------------------------------------------------------------------------------------------------  Urinalysis    Component Value Date/Time   COLORURINE YELLOW 09/07/2020 1803   APPEARANCEUR CLEAR 09/07/2020 1803   LABSPEC 1.019 09/07/2020 1803   PHURINE 5.0 09/07/2020 1803   GLUCOSEU NEGATIVE 09/07/2020 1803   HGBUR NEGATIVE 09/07/2020 1803   BILIRUBINUR NEGATIVE 09/07/2020 1803   KETONESUR NEGATIVE 09/07/2020 1803   PROTEINUR NEGATIVE 09/07/2020 1803   UROBILINOGEN 0.2 09/15/2013 1011   NITRITE NEGATIVE 09/07/2020 1803   LEUKOCYTESUR NEGATIVE 09/07/2020 1803    ----------------------------------------------------------------------------------------------------------------   Imaging Results:    DG Chest 2 View  Result Date: 09/13/2020 CLINICAL DATA:   Shortness of breath.  Right-sided weakness. EXAM: CHEST - 2 VIEW COMPARISON:  Radiograph 09/29/2018, chest CT 06/27/2020 FINDINGS: Lung volumes are low. Mild cardiomegaly. Bandlike scarring at the left lung base. No acute or confluent airspace disease. No pulmonary edema. No pleural effusion or pneumothorax. No acute osseous abnormalities are seen. IMPRESSION: Low lung volumes with mild cardiomegaly and left basilar scarring. Electronically Signed   By: Aurther Loft.D.  On: 09/13/2020 19:57   CT Head Wo Contrast  Result Date: 09/13/2020 CLINICAL DATA:  Right leg weakness EXAM: CT HEAD WITHOUT CONTRAST TECHNIQUE: Contiguous axial images were obtained from the base of the skull through the vertex without intravenous contrast. COMPARISON:  MRI 09/07/2020, CT 09/06/2020 FINDINGS: Brain: Mild residual hypodensity within the right occipital and posterior temporal lobes as well as splenium corpus callosum consistent with evolving right PCA infarct. No definitive hemorrhage seen. Hypodensity in the left pons, also consistent with evolving infarct. Negative for intracranial mass. Chronic left frontal lobe infarct. Chronic bilateral white matter and basal ganglial infarcts. Chronic lacunar infarcts in the thalamus. Stable ventricle size. Chronic small vessel ischemic changes of the white matter. Vascular: No hyperdense vessels. Mild carotid vascular calcification. Vertebral artery calcification Skull: Normal. Negative for fracture or focal lesion. Sinuses/Orbits: Mucosal thickening in the sinuses Other: None IMPRESSION: 1. Evolving right PCA infarct without interval hemorrhage. Evolving left pontine infarct without interval hemorrhage. No definite new acute intracranial abnormality since prior study from 09/06/2020. 2. Atrophy with chronic small vessel ischemic changes of the white matter. Multiple chronic infarcts involving left frontal lobe, bilateral white matter, basal ganglia and thalamus. Electronically  Signed   By: Donavan Foil M.D.   On: 09/13/2020 20:24   MR ANGIO HEAD WO CONTRAST  Result Date: 09/15/2020 CLINICAL DATA:  Stroke EXAM: MRA HEAD WITHOUT CONTRAST TECHNIQUE: Angiographic images of the Circle of Willis were acquired using MRA technique without intravenous contrast. COMPARISON:  MRI head 09/15/2020.  CT a 09/06/2020 FINDINGS: Anterior circulation: Motion degraded study. This significantly limits evaluation for small vessel disease. Internal carotid artery patent bilaterally without significant stenosis. Irregular areas of stenosis in the anterior and middle cerebral artery branches bilaterally consistent with widespread atherosclerotic disease. This is confirmed on the prior CTA. No large vessel occlusion. Posterior circulation: Both vertebral arteries are patent to the basilar. Basilar patent. Superior cerebellar arteries patent bilaterally. Occlusion of the proximal right posterior cerebral artery corresponding to right PCA infarct on MRI. There is severe stenosis of the left posterior cerebral artery which has distal flow. This appears progressive from the prior CT angiogram. Differences could be due to artifact from motion. Anatomic variants: None Other: None IMPRESSION: Motion degraded study Diffuse intracranial atherosclerotic disease, moderate to severe Occlusion right PCA. Severe stenosis left PCA with distal flow. This may have progressed since prior studies however is difficult to compare given the amount of motion on the MRA. Electronically Signed   By: Franchot Gallo M.D.   On: 09/15/2020 16:32   MR BRAIN WO CONTRAST  Result Date: 09/15/2020 CLINICAL DATA:  Transient ischemic attack (TIA). Right leg weakness. EXAM: MRI HEAD WITHOUT CONTRAST TECHNIQUE: Multiplanar, multiecho pulse sequences of the brain and surrounding structures were obtained without intravenous contrast. COMPARISON:  Head CT 09/13/2020 and MRI 09/07/2020 FINDINGS: Brain: There is a new acute left lateral  lenticulostriate territory infarct extending from the posterior aspect of the left lentiform nucleus to the caudate body and involving the intervening deep white matter. There is residual diffusion weighted signal abnormality at the sites of the posterior circulation infarcts on the prior MRI (right temporal and occipital lobes, splenium of the corpus callosum, and left pons) without evidence of significant interval infarct extension. There is minimal petechial hemorrhage in the right occipital lobe. A chronic microhemorrhage is noted in the anterior right frontal lobe. Chronic lacunar infarcts are again noted involving the deep cerebral white matter bilaterally, basal ganglia, and thalami, and there is a chronic  moderate-sized left MCA infarct near the anterior aspect of the left frontal operculum. T2 hyperintensities elsewhere in the cerebral white matter bilaterally are unchanged and nonspecific but compatible with moderate chronic small vessel ischemic disease. No mass, midline shift, or extra-axial fluid collection is identified. There is mild cerebral atrophy. Vascular: Major intracranial vascular flow voids are preserved. Skull and upper cervical spine: Unremarkable bone marrow signal. Sinuses/Orbits: Unremarkable orbits. Minimal mucosal thickening or small mucous retention cyst in left sphenoid sinus. Small right and moderate left mastoid effusions. Other: None. IMPRESSION: 1. Acute left basal ganglia infarct. 2. Evolving subacute posterior circulation infarcts without extension from the 09/07/2020 MRI. 3. 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 Electronically Signed   By: Logan Bores M.D.   On: 09/15/2020 14:51   DG Chest Port 1 View  Result Date: 09/15/2020 CLINICAL DATA:  Pt has history of 4 previous strokes since Dec. Pt typically has SOB, but worsened yesterday. Hx of COPD, diabetes, HTN. Current smoker. EXAM: PORTABLE CHEST 1 VIEW COMPARISON:  Chest x-ray  09/13/2020, CT chest 06/27/2020 FINDINGS: Enlarged cardiac silhouette. The heart size and mediastinal contours are unchanged. No focal consolidation. No pulmonary edema. No pleural effusion. No pneumothorax. No acute osseous abnormality. IMPRESSION: No active disease. Electronically Signed   By: Iven Finn M.D.   On: 09/15/2020 15:32    My personal review of EKG: Rhythm NSR,  Vent. rate 96 BPM PR interval 177 ms QRS duration 87 ms QT/QTcB 328/415 ms P-R-T axes 62 1 117 Sinus rhythm Left atrial enlargement LVH with secondary repolarization abnormality Anterior infarct, old Baseline wander in lead(s) V6   Assessment & Plan:    Active Problems:   HTN (hypertension)   DM type 2 (diabetes mellitus, type 2) (HCC)   Chronic diastolic CHF (congestive heart failure) (HCC)   CKD (chronic kidney disease), stage III (HCC)   Chronic obstructive pulmonary disease (HCC)   Acute CVA (cerebrovascular accident) ([place])   Acute CVA -Patient presents with worsening right-sided weakness, MRI of the brain significant for new acute CVA in the left basal ganglia, was not present during recent hospitalization for another CVA in the PCA area -Work-up eluding MRA significant for moderate to severe atherosclerosis disease.  Evidence of right PCA occlusion, left PCA severe stenosis -will Hold on repeating work-up including lipid panel, A1c and 2D echo, CTA neck given it was just recently done last week -Patient given another 3 chewable aspirin to receive full dose aspirin, we will continue him on his home dose aspirin and Brilinta while awaiting further recommendation from neurology. -We will monitor on telemetry. -Continue with home dose statin -Low for permissive hypertension   Hypertension -Allow for permissive hypertension, hold home medications   Diabetes mellitus -Recent A1c 7.6, will hold metformin and glipizide and keep on insulin sliding scale  CKD stage III AAA -Renal function at baseline,  continue to monitor closely, avoid nephrotoxic medications  COPD -He reports some dyspnea initially, he did receive Solu-Medrol initially, I hear no wheezing, he is in no respiratory distress, continue with home medications.  Tobacco abuse Reports he quit smoking 3 weeks ago, he was congratulated  Family Communication: Admission, patients condition and plan of care including tests being ordered have been discussed with the patient and wife at bedside* who indicate understanding and agree with the plan and Code Status.  Code Status Full  Likely DC to  home  Condition GUARDED    Consults called: ED D/W Dr Leonel Ramsay  Admission status: inpatient    Time spent in minutes : 60 minutes   Phillips Climes M.D on 09/15/2020 at 5:54 PM   [facility] - Office  [phone]

## 2020-09-15 NOTE — ED Triage Notes (Signed)
Per Meagan RN, EMS said pt has history of 4 previous strokes since Dec.  Has PT at home.  Reports has deficits on R side from previous stroke.  Reports had PT yesterday and reports pt was dragging r foot more than usual that started at 11am yesterday.  Today family called ems for c/o SOB.  Reports SOB started yesterday.  Reports takes breathing treatments as needed.  Pt says he did not use a breathing treatment today.   Denies any cough or fever.  Reports SOB is not new.

## 2020-09-15 NOTE — ED Notes (Signed)
Attempted to call report, rn not available, will call back

## 2020-09-15 NOTE — ED Notes (Signed)
Pt in bed, pt denies pain, pt c/o sob, pt satting 97%, resps even and unlabored, talking in full sentences, sig other at bedside.  Pt ambulatory to bathroom

## 2020-09-15 NOTE — ED Provider Notes (Signed)
[facility] EMERGENCY DEPARTMENT Provider Note   CSN: [id number] Arrival date & time: 09/15/20  1238     History Chief Complaint  Patient presents with   Shortness of Breath    Craig Knight is a 55 y.o. male.  HPI  Patient with significant medical history of asthma, bronchitis, diabetes, hypertension, recent 07/13 left-sided stroke with right-sided weakness per neurologist Xu   patient has left hemianopia right-sided facial droop, right upper extremity pronator drift, per physical therapy assessment yesterday patient has 3 out of 5 strength on the right side in the upper and lower extremities ambulates with some assistance, presents to the emergency department with multitude of complaints.  He states that he is here mainly because of the weakness on his right side which has been getting worse over the last week.  He denies recent falls, denies headaches, change in vision, worsening paresthesia in his right extremities.  He states that he just feels more fatigued on that right side and states that he is unable to ambulate due to that.  He also notes that he is short of breath, he states this is not new for him, states that he felt short of breath yesterday took a breathing treatment felt much better, he is short of breath today but states he did on to get breathing treatment and thinks it is from this.  He denies fevers, chills, nasal congestion, sore throat, productive cough, general body aches.  He has no other complaints at this time.  Past Medical History:  Diagnosis Date   Asthma    Bronchitis    COPD (chronic obstructive pulmonary disease) ([place])    Diabetes mellitus without complication ([place])    Heart murmur [id number]   soft systolic murmur 1/6   Hypertension    Sleep apnea    Stroke [facility])     Patient Active Problem List   Diagnosis Date Noted   Acute CVA (cerebrovascular accident) ([place]) 09/06/2020   Stroke ([place]) 11/23/2018   Numbness 11/23/2018   CVA (cerebral vascular  accident) ([place]) 11/23/2018   Lung nodule 11/17/2017   Chest pressure 11/22/2016   Shortness of breath 11/22/2016   Depression, major, single episode, moderate ([place]) 10/14/2016   Special screening for malignant neoplasms, colon    Chronic obstructive pulmonary disease ([place]) 06/08/2016   Hypertensive emergency 02/16/2016   Hypertensive urgency 06/07/2015   CKD (chronic kidney disease), stage III ([place]) 06/07/2015   Pain in the chest    Elevated troponin    Chronic diastolic CHF (congestive heart failure) ([place]) 09/17/2013   DM type 2 (diabetes mellitus, type 2) ([place]) 09/15/2013   Chest pain 08/01/2011   HTN (hypertension) 08/01/2011   Chest pain 07/22/2011   Renal insufficiency 07/22/2011   Tobacco abuse 07/22/2011   Hypertension     Past Surgical History:  Procedure Laterality Date   COLONOSCOPY N/A 09/18/2016   Procedure: COLONOSCOPY;  Surgeon: Danie Binder, MD;  Location: [facility];  Service: Endoscopy;  Laterality: N/A;  10:30 AM   lipoma removal         Family History  Problem Relation Age of Onset   Stroke Mother    Heart attack Father 36       CABG   Hypertension Sister    Stroke Brother    Hypertension Brother    Stomach cancer Brother    Stroke Maternal Grandmother    Heart attack Maternal Grandfather    Heart attack Paternal Grandmother     Social History  Tobacco Use   Smoking status: Every Day    Packs/day: 0.25    Types: Cigarettes   Smokeless tobacco: Never  Vaping Use   Vaping Use: Never used  Substance Use Topics   Alcohol use: Yes    Alcohol/week: 0.0 standard drinks    Comment: occasionally   Drug use: No    Home Medications Prior to Admission medications   Medication Sig Start Date End Date Taking? Authorizing Provider  albuterol (VENTOLIN HFA) 108 (90 Base) MCG/ACT inhaler Inhale 2 puffs into the lungs every 6 (six) hours as needed for wheezing or shortness of breath. 09/11/20  Yes Mannam, Praveen, MD  aspirin EC 81 MG tablet Take  1 tablet (81 mg total) by mouth daily. 04/16/18  Yes Carmin Muskrat, MD  atorvastatin (LIPITOR) 80 MG tablet Take 1 tablet (80 mg total) by mouth daily. 09/08/20  Yes Mercy Riding, MD  chlorthalidone (HYGROTON) 25 MG tablet Take 1 tablet (25 mg total) by mouth daily. 09/13/20 10/18/20 Yes Mercy Riding, MD  clopidogrel (PLAVIX) 75 MG tablet Take 1 tablet (75 mg total) by mouth daily. 10/09/20  Yes Mercy Riding, MD  Fluticasone-Umeclidin-Vilant (TRELEGY ELLIPTA) 100-62.5-25 MCG/INH AEPB Inhale 1 puff into the lungs daily. 09/11/20  Yes Mannam, Praveen, MD  gabapentin (NEURONTIN) 300 MG capsule Take 300 mg by mouth at bedtime. 12/10/18  Yes [provider]  glipiZIDE (GLUCOTROL) 5 MG tablet Take 1 tablet (5 mg total) by mouth daily. 09/08/20 09/08/21 Yes Mercy Riding, MD  hydrALAZINE (APRESOLINE) 50 MG tablet Take 50 mg by mouth 2 (two) times daily. 07/27/20  Yes [provider]  ipratropium-albuterol (DUONEB) 0.5-2.5 (3) MG/3ML SOLN Take 3 mLs by nebulization every 4 (four) hours as needed. 11/20/17  Yes Fenton Foy, NP  isosorbide mononitrate (IMDUR) 60 MG 24 hr tablet Take 1 tablet (60 mg total) by mouth daily. 04/16/18  Yes Carmin Muskrat, MD  losartan (COZAAR) 100 MG tablet Take 1 tablet (100 mg total) by mouth daily. 09/12/20  Yes Mercy Riding, MD  metFORMIN (GLUCOPHAGE) 500 MG tablet Take 1 tablet (500 mg total) by mouth 2 (two) times daily with a meal. 09/08/20  Yes Gonfa, Charlesetta Ivory, MD  nitroGLYCERIN (NITROSTAT) 0.4 MG SL tablet Place 1 tablet (0.4 mg total) under the tongue every 5 (five) minutes as needed for chest pain. 04/16/18  Yes Carmin Muskrat, MD  NON FORMULARY Pt uses cpap nightly   Yes [provider]  ticagrelor (BRILINTA) 90 MG TABS tablet Take 1 tablet (90 mg total) by mouth 2 (two) times daily. Start Plavix when you complete this for one month 09/08/20  Yes Gonfa, Charlesetta Ivory, MD  traZODone (DESYREL) 100 MG tablet Take 100 mg by mouth at bedtime. 09/15/20    [provider]    Allergies    Dust mite extract and Lisinopril  Review of Systems   Review of Systems  Constitutional:  Negative for chills and fever.  HENT:  Negative for congestion and sore throat.   Respiratory:  Positive for shortness of breath. Negative for cough.   Cardiovascular:  Negative for chest pain.  Gastrointestinal:  Negative for abdominal pain, diarrhea, nausea and vomiting.  Genitourinary:  Negative for enuresis.  Musculoskeletal:  Negative for back pain and myalgias.  Skin:  Negative for rash.  Neurological:  Positive for weakness. Negative for dizziness, numbness and headaches.  Hematological:  Does not bruise/bleed easily.   Physical Exam Updated Vital Signs BP (!) 137/99  Pulse 88   Temp 97.7 F (36.5 C) (Oral)   Resp 14   Ht '5\' 7"'$  (1.702 m)   Wt 102.1 kg   SpO2 99%   BMI 35.24 kg/m   Physical Exam Vitals and nursing note reviewed.  Constitutional:      General: He is not in acute distress.    Appearance: He is not ill-appearing.  HENT:     Head: Normocephalic and atraumatic.     Nose: No congestion.  Eyes:     Extraocular Movements: Extraocular movements intact.     Conjunctiva/sclera: Conjunctivae normal.     Pupils: Pupils are equal, round, and reactive to light.     Comments: Right pupil is slightly larger than the left.  Cardiovascular:     Rate and Rhythm: Normal rate and regular rhythm.     Pulses: Normal pulses.     Heart sounds: No murmur heard.   No friction rub. No gallop.  Pulmonary:     Effort: No respiratory distress.     Breath sounds: No wheezing, rhonchi or rales.  Abdominal:     Palpations: Abdomen is soft.     Tenderness: There is no abdominal tenderness.  Musculoskeletal:     Right lower leg: No edema.     Left lower leg: No edema.  Skin:    General: Skin is warm and dry.  Neurological:     Mental Status: He is alert.     GCS: GCS eye subscore is 4. GCS verbal subscore is 5. GCS motor subscore is 6.      Comments: Right-sided facial droop present, no difficulty with word finding, no slurring words, right-sided weakness noted in the upper and lower extremities 3 out of 5 strength versus 5 out of 5 strength on the left, right pronator drift present.  Patient is able to follow commands without difficulty  Psychiatric:        Mood and Affect: Mood normal.    ED Results / Procedures / Treatments   Labs (all labs ordered are listed, but only abnormal results are displayed) Labs Reviewed  COMPREHENSIVE METABOLIC PANEL - Abnormal; Notable for the following components:      Result Value   CO2 20 (*)    BUN 33 (*)    Creatinine, Ser 2.01 (*)    GFR, Estimated 38 (*)    All other components within normal limits  CBC WITH DIFFERENTIAL/PLATELET - Abnormal; Notable for the following components:   WBC 12.8 (*)    Neutro Abs 10.6 (*)    All other components within normal limits  TROPONIN I (HIGH SENSITIVITY) - Abnormal; Notable for the following components:   Troponin I (High Sensitivity) 33 (*)    All other components within normal limits  TROPONIN I (HIGH SENSITIVITY) - Abnormal; Notable for the following components:   Troponin I (High Sensitivity) 50 (*)    All other components within normal limits  RESP PANEL BY RT-PCR (FLU A&B, COVID) [id number]  PROTIME-INR    EKG EKG Interpretation  Date/Time:  Friday September 15 2020 13:30:26 EDT Ventricular Rate:  96 PR Interval:  177 QRS Duration: 87 QT Interval:  328 QTC Calculation: 415 R Axis:   1 Text Interpretation: Sinus rhythm Left atrial enlargement LVH with secondary repolarization abnormality Anterior infarct, old Baseline wander in lead(s) V6 Since last tracing arm lead reversal corrected Otherwise no significant change Confirmed by Daleen Bo [phone]) on 09/15/2020 1:47:31 PM  Radiology DG Chest 2 View  Result Date:  09/13/2020 CLINICAL DATA:  Shortness of breath.  Right-sided weakness. EXAM: CHEST - 2 VIEW COMPARISON:  Radiograph  09/29/2018, chest CT 06/27/2020 FINDINGS: Lung volumes are low. Mild cardiomegaly. Bandlike scarring at the left lung base. No acute or confluent airspace disease. No pulmonary edema. No pleural effusion or pneumothorax. No acute osseous abnormalities are seen. IMPRESSION: Low lung volumes with mild cardiomegaly and left basilar scarring. Electronically Signed   By: Keith Rake M.D.   On: 09/13/2020 19:57   CT Head Wo Contrast  Result Date: 09/13/2020 CLINICAL DATA:  Right leg weakness EXAM: CT HEAD WITHOUT CONTRAST TECHNIQUE: Contiguous axial images were obtained from the base of the skull through the vertex without intravenous contrast. COMPARISON:  MRI 09/07/2020, CT 09/06/2020 FINDINGS: Brain: Mild residual hypodensity within the right occipital and posterior temporal lobes as well as splenium corpus callosum consistent with evolving right PCA infarct. No definitive hemorrhage seen. Hypodensity in the left pons, also consistent with evolving infarct. Negative for intracranial mass. Chronic left frontal lobe infarct. Chronic bilateral white matter and basal ganglial infarcts. Chronic lacunar infarcts in the thalamus. Stable ventricle size. Chronic small vessel ischemic changes of the white matter. Vascular: No hyperdense vessels. Mild carotid vascular calcification. Vertebral artery calcification Skull: Normal. Negative for fracture or focal lesion. Sinuses/Orbits: Mucosal thickening in the sinuses Other: None IMPRESSION: 1. Evolving right PCA infarct without interval hemorrhage. Evolving left pontine infarct without interval hemorrhage. No definite new acute intracranial abnormality since prior study from 09/06/2020. 2. Atrophy with chronic small vessel ischemic changes of the white matter. Multiple chronic infarcts involving left frontal lobe, bilateral white matter, basal ganglia and thalamus. Electronically Signed   By: Donavan Foil M.D.   On: 09/13/2020 20:24   MR ANGIO HEAD WO CONTRAST  Result  Date: 09/15/2020 CLINICAL DATA:  Stroke EXAM: MRA HEAD WITHOUT CONTRAST TECHNIQUE: Angiographic images of the Circle of Willis were acquired using MRA technique without intravenous contrast. COMPARISON:  MRI head 09/15/2020.  CT a 09/06/2020 FINDINGS: Anterior circulation: Motion degraded study. This significantly limits evaluation for small vessel disease. Internal carotid artery patent bilaterally without significant stenosis. Irregular areas of stenosis in the anterior and middle cerebral artery branches bilaterally consistent with widespread atherosclerotic disease. This is confirmed on the prior CTA. No large vessel occlusion. Posterior circulation: Both vertebral arteries are patent to the basilar. Basilar patent. Superior cerebellar arteries patent bilaterally. Occlusion of the proximal right posterior cerebral artery corresponding to right PCA infarct on MRI. There is severe stenosis of the left posterior cerebral artery which has distal flow. This appears progressive from the prior CT angiogram. Differences could be due to artifact from motion. Anatomic variants: None Other: None IMPRESSION: Motion degraded study Diffuse intracranial atherosclerotic disease, moderate to severe Occlusion right PCA. Severe stenosis left PCA with distal flow. This may have progressed since prior studies however is difficult to compare given the amount of motion on the MRA. Electronically Signed   By: Franchot Gallo M.D.   On: 09/15/2020 16:32   MR BRAIN WO CONTRAST  Result Date: 09/15/2020 CLINICAL DATA:  Transient ischemic attack (TIA). Right leg weakness. EXAM: MRI HEAD WITHOUT CONTRAST TECHNIQUE: Multiplanar, multiecho pulse sequences of the brain and surrounding structures were obtained without intravenous contrast. COMPARISON:  Head CT 09/13/2020 and MRI 09/07/2020 FINDINGS: Brain: There is a new acute left lateral lenticulostriate territory infarct extending from the posterior aspect of the left lentiform nucleus to  the caudate body and involving the intervening deep white matter. There is residual  diffusion weighted signal abnormality at the sites of the posterior circulation infarcts on the prior MRI (right temporal and occipital lobes, splenium of the corpus callosum, and left pons) without evidence of significant interval infarct extension. There is minimal petechial hemorrhage in the right occipital lobe. A chronic microhemorrhage is noted in the anterior right frontal lobe. Chronic lacunar infarcts are again noted involving the deep cerebral white matter bilaterally, basal ganglia, and thalami, and there is a chronic moderate-sized left MCA infarct near the anterior aspect of the left frontal operculum. T2 hyperintensities elsewhere in the cerebral white matter bilaterally are unchanged and nonspecific but compatible with moderate chronic small vessel ischemic disease. No mass, midline shift, or extra-axial fluid collection is identified. There is mild cerebral atrophy. Vascular: Major intracranial vascular flow voids are preserved. Skull and upper cervical spine: Unremarkable bone marrow signal. Sinuses/Orbits: Unremarkable orbits. Minimal mucosal thickening or small mucous retention cyst in left sphenoid sinus. Small right and moderate left mastoid effusions. Other: None. IMPRESSION: 1. Acute left basal ganglia infarct. 2. Evolving subacute posterior circulation infarcts without extension from the 09/07/2020 MRI. 3. 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 Electronically Signed   By: Logan Bores M.D.   On: 09/15/2020 14:51   DG Chest Port 1 View  Result Date: 09/15/2020 CLINICAL DATA:  Pt has history of 4 previous strokes since Dec. Pt typically has SOB, but worsened yesterday. Hx of COPD, diabetes, HTN. Current smoker. EXAM: PORTABLE CHEST 1 VIEW COMPARISON:  Chest x-ray 09/13/2020, CT chest 06/27/2020 FINDINGS: Enlarged cardiac silhouette. The heart size and mediastinal contours  are unchanged. No focal consolidation. No pulmonary edema. No pleural effusion. No pneumothorax. No acute osseous abnormality. IMPRESSION: No active disease. Electronically Signed   By: Iven Finn M.D.   On: 09/15/2020 15:32    Procedures Procedures   Medications Ordered in ED Medications  Ipratropium-Albuterol (COMBIVENT) respimat 2 puff (2 puffs Inhalation Given 09/15/20 1441)  methylPREDNISolone sodium succinate (SOLU-MEDROL) 125 mg/2 mL injection 125 mg (125 mg Intravenous Given 09/15/20 1443)  aspirin chewable tablet 241 mg (241 mg Oral Given 09/15/20 1736)    ED Course  I have reviewed the triage vital signs and the nursing notes.  Pertinent labs & imaging results that were available during my care of the patient were reviewed by me and considered in my medical decision making (see chart for details).    MDM Rules/Calculators/A&P                          Initial impression-presents with right-sided weakness as well as shortness of breath.  He is alert, does not appear in distress, vital signs reassuring.  Neuro exam was unremarkable, difficult to differentiate between prior stroke versus acute stroke will obtain MRI for further evaluation.  No tight sounding chest, no wheezing or rhonchi present, will obtain basic lab work-up, provide patient with a breathing treatment, and reassess  Work-up-CBC shows slight leukocytosis of 12.8, CMP shows slight decrease in CO2 of 20, creatinine 2.01 appears to be at baseline, prothrombin time, INR unremarkable, respiratory panel negative, troponin 33 second 50.  MRI brain without contrast reveals acute left basilar ganglia infarction, evolving subacute posterior circulation lesion infarction without extension from the 17th MRI, moderate chronic small vessel ischemia.  EKG sinus rhythm  without signs of ischemia  Reassessment-due to new infarct seen on MRI will consult with neurology for further recommendations.  Consult-spoke to Dr. Rodney Booze  he recommends transfer  to [facility] as we do not have a neurologist over the weekend, he also recommends MRA head without contrast for further evaluation.  He has no other recommendations at this time.  Spoke with dr Waldron Labs, he will admit the patient.   Rule out- I have low suspicion for ACS as history is atypical, , EKG was sinus rhythm without signs of ischemia, patient does have an elevated troponin first 33 next was 50 but have low suspicion for ACS as there is no EKG changes, patient does not having chest pain at this time.  I suspect the slight increase is most likely from poor excretion due to poor kidney function.  And was low suspicion for PE as patient denies pleuritic chest pain, shortness of breath, patient denies leg pain, no pedal edema noted on exam, patient was PERC negative.  Low suspicion for AAA or aortic dissection as history is atypical, patient has low risk factors.  Low suspicion for systemic infection as patient is nontoxic-appearing, vital signs reassuring, no obvious source infection noted on exam.  Low suspicion for pneumonia as chest x-ray is negative for acute findings, patient does have elevated leukocytosis possibly might be suffering from URI versus acute phase reaction from acute infarct.   Plan-admit to medicine due to new infarct and transferred to [facility] as we do not have a neurologist team available over the weekend.   Final Clinical Impression(s) / ED Diagnoses Final diagnoses:  SOB (shortness of breath)  Acute cerebral infarction [facility])    Rx / DC Orders ED Discharge Orders     None        Aron Baba 09/15/20 1803    Daleen Bo, MD 09/15/20 [phone]

## 2020-09-16 ENCOUNTER — Inpatient Hospital Stay (HOSPITAL_COMMUNITY): Payer: Medicare Other

## 2020-09-16 DIAGNOSIS — I5032 Chronic diastolic (congestive) heart failure: Secondary | ICD-10-CM

## 2020-09-16 DIAGNOSIS — J449 Chronic obstructive pulmonary disease, unspecified: Secondary | ICD-10-CM | POA: Diagnosis not present

## 2020-09-16 DIAGNOSIS — I639 Cerebral infarction, unspecified: Secondary | ICD-10-CM | POA: Diagnosis not present

## 2020-09-16 LAB — GLUCOSE, CAPILLARY
Glucose-Capillary: 122 mg/dL — ABNORMAL HIGH (ref 70–99)
Glucose-Capillary: 145 mg/dL — ABNORMAL HIGH (ref 70–99)
Glucose-Capillary: 122 mg/dL — ABNORMAL HIGH (ref 70–99)
Glucose-Capillary: 120 mg/dL — ABNORMAL HIGH (ref 70–99)

## 2020-09-16 LAB — LIPID PANEL
Cholesterol: 115 mg/dL (ref 0–200)
HDL: 33 mg/dL — ABNORMAL LOW (ref >40)
LDL Cholesterol: 67 mg/dL (ref 0–99)
Total CHOL/HDL Ratio: 3.5 RATIO
Triglycerides: 77 mg/dL (ref <150)
VLDL: 15 mg/dL (ref 0–40)

## 2020-09-16 MED ORDER — LACTATED RINGERS IV SOLN: INTRAVENOUS | Status: DC

## 2020-09-16 MED ORDER — SODIUM CHLORIDE 0.9 % IV SOLN: INTRAVENOUS | Status: DC

## 2020-09-16 MED ORDER — LORAZEPAM 2 MG/ML IJ SOLN
0.5000 mg | Freq: Once | INTRAMUSCULAR | Status: AC
  Administered 2020-09-16: 0.5 mg via INTRAVENOUS
  Filled 2020-09-16: qty 1

## 2020-09-16 MED ORDER — GABAPENTIN 300 MG PO CAPS
300.0000 mg | ORAL_CAPSULE | Freq: Every day | ORAL | Status: DC
  Administered 2020-09-16 – 2020-09-18 (×3): 300 mg via ORAL
  Filled 2020-09-16 (×4): qty 1

## 2020-09-16 MED ORDER — IOHEXOL 350 MG/ML SOLN
115.0000 mL | Freq: Once | INTRAVENOUS | Status: AC | PRN
  Administered 2020-09-16: 115 mL via INTRAVENOUS

## 2020-09-16 MED ORDER — IOHEXOL 350 MG/ML SOLN: 100.0000 mL | Freq: Once | INTRAVENOUS | Status: DC | PRN

## 2020-09-16 MED ORDER — TRAZODONE HCL 100 MG PO TABS
100.0000 mg | ORAL_TABLET | Freq: Every day | ORAL | Status: DC
  Administered 2020-09-16 – 2020-09-18 (×3): 100 mg via ORAL
  Filled 2020-09-16 (×3): qty 1

## 2020-09-16 NOTE — Evaluation (Signed)
Physical Therapy Evaluation Patient Details Name: Craig Knight MRN: [id number] DOB: 04/17/1965 Today's Date: 09/16/2020   History of Present Illness  Pt is a 55 y.o. male who presented 09/15/20 with SOB and worsening R sided weakness. Pt admitted 09/06/20-09/08/20 with R-sided weakness and speech changes in which they found pt had large R PCA stroke, left pontine stroke, and remote L frontal and lacunar infarcts in bil basal ganglia, corona radiata and thalami. MRI 09/15/20 revealed new acute L basal ganglia infarct and evolving R PCA stroke, and MRA revealed occlusion at R PCA and severe stenosis L PCA with distal flow. Outside window for tPA. PMH COPD, DM, HTN, CVA   Clinical Impression  Pt presents with condition above and deficits mentioned below, see PT Problem List. Pt reports after his most recent admission and CVAs he was independent with mobility without AD/AE. He lives with his family in a 1-level house with 2 STE without rails. Currently, pt displays significant R-sided weakness, L hemianopia, balance deficits, possible expressive deficits, decreased activity tolerance, and incoordination that places him at high risk for falls. Pt is requiring minA for bed mobility and transfers with a RW and modA to take several small steps within the room with the RW this date, displaying inability to clear his R foot to step without assistance. Pt would greatly benefit from intensive therapy in the CIR setting to maximize his safety and independence with all functional mobility prior to return home. Will continue to follow acutely.     Follow Up Recommendations CIR;Supervision for mobility/OOB    Equipment Recommendations  Rolling walker with 5" wheels;3in1 (PT)    Recommendations for Other Services Rehab consult     Precautions / Restrictions Precautions Precautions: Fall Precaution Comments: L hemianopia Restrictions Weight Bearing Restrictions: No      Mobility  Bed Mobility Overal bed  mobility: Needs Assistance Bed Mobility: Supine to Sit     Supine to sit: Min assist     General bed mobility comments: Pt able to initiate legs off EOB, using his UEs to assist his R leg off R EOB. However, pt needing minA to bring R UE posteriorly and assist in initiating trunk elevation to get R elbow under him to push up to sit.    Transfers Overall transfer level: Needs assistance Equipment used: Rolling walker (2 wheeled) Transfers: Sit to/from Stand Sit to Stand: Min assist         General transfer comment: Pt pulling up on RW, quickly trying to power up to stand, x2 attempts before successful with minA to complete and steady him. Needs cues for R hand placement on RW.  Ambulation/Gait Ambulation/Gait assistance: Mod assist Gait Distance (Feet): 4 Feet Assistive device: Rolling walker (2 wheeled) Gait Pattern/deviations: Step-to pattern;Decreased step length - right;Decreased stride length;Decreased dorsiflexion - right;Narrow base of support Gait velocity: decreased Gait velocity interpretation: <1.31 ft/sec, indicative of household ambulator General Gait Details: Pt displaying no R knee buckling, but displaying difficulty lifting foot off ground to complete R foot advancement during swing, needing physical assistance to do so. ModA to steady and assist with R leg stepping anterior and posterior a couple times. Limited gait distance due to transport arriving to take pt to test.  Stairs            Wheelchair Mobility    Modified Rankin (Stroke Patients Only) Modified Rankin (Stroke Patients Only) Pre-Morbid Rankin Score: Moderate disability Modified Rankin: Moderately severe disability     Balance Overall balance assessment:  Needs assistance Sitting-balance support: No upper extremity supported;Feet supported Sitting balance-Leahy Scale: Fair Sitting balance - Comments: Able to reach min off BOS, but pt displays LOB (to R often) when reaching further to try to  reach floor or when shifting weight significantly, needing min guard-minA sitting EOB dynamically. Postural control: Right lateral lean Standing balance support: Bilateral upper extremity supported;During functional activity Standing balance-Leahy Scale: Poor Standing balance comment: Reliant on UE support and physical assistance.                             Pertinent Vitals/Pain Pain Assessment: Faces Faces Pain Scale: No hurt Pain Intervention(s): Monitored during session    Home Living Family/patient expects to be discharged to:: Private residence Living Arrangements: Spouse/significant other;Children Available Help at Discharge: Family;Available 24 hours/day Type of Home: House Home Access: Stairs to enter Entrance Stairs-Rails: None Entrance Stairs-Number of Steps: 2 no rails Home Layout: One level Home Equipment: None      Prior Function Level of Independence: Independent         Comments: Pt reports ambulating independently following his most recent admission and CVAs.     Hand Dominance   Dominant Hand: Right    Extremity/Trunk Assessment   Upper Extremity Assessment Upper Extremity Assessment: Defer to OT evaluation    Lower Extremity Assessment Lower Extremity Assessment: RLE deficits/detail (L MMT grossly 4+ to 5; denies numbness/tingling bil) RLE Deficits / Details: MMT scores of the following = hip flexion 2+, knee extension 3-, ankle dorsiflexion 2+ RLE Sensation:  (denies numbness/tingling) RLE Coordination: decreased fine motor;decreased gross motor    Cervical / Trunk Assessment Cervical / Trunk Assessment: Normal  Communication   Communication: Expressive difficulties (possible slurred speech at times during session)  Cognition Arousal/Alertness: Awake/alert Behavior During Therapy: WFL for tasks assessed/performed Overall Cognitive Status: Within Functional Limits for tasks assessed                                  General Comments: A&Ox4, appears to appropriately be aware of L visual field deficits and difficulty with R sided strength and mobility. Could benefit from further assessment.      General Comments General comments (skin integrity, edema, etc.): Pt stating "I feel weaker, weaker" repeatedly, notified RN of pt reporting feeling weaker currently than even earlier    Exercises     Assessment/Plan    PT Assessment Patient needs continued PT services  PT Problem List Decreased activity tolerance;Decreased safety awareness;Decreased balance;Decreased mobility;Decreased coordination;Decreased strength;Decreased range of motion;Decreased knowledge of use of DME       PT Treatment Interventions DME instruction;Gait training;Stair training;Functional mobility training;Therapeutic activities;Therapeutic exercise;Balance training;Neuromuscular re-education;Cognitive remediation;Patient/family education    PT Goals (Current goals can be found in the Care Plan section)  Acute Rehab PT Goals Patient Stated Goal: to get stronger PT Goal Formulation: With patient Time For Goal Achievement: 09/30/20 Potential to Achieve Goals: Good    Frequency Min 4X/week   Barriers to discharge        Co-evaluation               AM-PAC PT "6 Clicks" Mobility  Outcome Measure Help needed turning from your back to your side while in a flat bed without using bedrails?: A Little Help needed moving from lying on your back to sitting on the side of a flat bed without using bedrails?: A  Little Help needed moving to and from a bed to a chair (including a wheelchair)?: A Lot Help needed standing up from a chair using your arms (e.g., wheelchair or bedside chair)?: A Little Help needed to walk in hospital room?: A Lot Help needed climbing 3-5 steps with a railing? : Total 6 Click Score: 14    End of Session Equipment Utilized During Treatment: Gait belt Activity Tolerance: Patient tolerated treatment  well Patient left: in bed;with call bell/phone within reach;Other (comment) (with transport taking pt to test) Nurse Communication: Mobility status;Other (comment) (pt reporting feeling weaker now than earlier) PT Visit Diagnosis: Other symptoms and signs involving the nervous system (R29.898);Unsteadiness on feet (R26.81);Other abnormalities of gait and mobility (R26.89);Muscle weakness (generalized) (M62.81);Difficulty in walking, not elsewhere classified (R26.2);Hemiplegia and hemiparesis Hemiplegia - Right/Left: Right Hemiplegia - dominant/non-dominant: Dominant Hemiplegia - caused by: Cerebral infarction    Time: [id number] PT Time Calculation (min) (ACUTE ONLY): 17 min   Charges:   PT Evaluation $PT Eval Moderate Complexity: 1 Mod          Moishe Spice, PT, DPT Acute Rehabilitation Services  Pager: [phone] Office: [phone]   Orvan Falconer 09/16/2020, 9:05 AM

## 2020-09-16 NOTE — PMR Pre-admission (Signed)
PMR Admission Coordinator Pre-Admission Assessment  Patient: Craig Knight is an 55 y.o., male MRN: [id number] DOB: 05/31/1965 Height: 5' 7" (170.2 cm) Weight: 102.1 kg  Insurance Information HMO: yes    PPO:      PCP:      IPA:      80/20:      OTHER:  PRIMARY: [employer] Medicare      Policy#: [id number]      Subscriber: patient CM Name:       Phone#: [phone]     Fax#: [phone] Pre-Cert#: [id number]  Shelby from [employer] gave approval on 09/18/20. Pt approved for 7 days beginning 7/26-8/1. Pt'Knight [employer] number: [id number]    Employer:  Benefits:  Phone #: [email or web address]     Name:  Eff. Date: 02/26/20-present    Deduct: $0 (does not have deductible)   Out of Pocket Max: $6,700 ($211.92 met)    Life Max: NA CIR: $430/day co-pay for days 1-4, $0/day co-pay for days 5+      SNF: $0/day co-pay for days 1-20, $188/day co-pay for days 21-56, $0/day co-pay for days 57-100; limited to 100 days/cal year Outpatient: $30/visit co-pay     Co-Pay:  Home Health: 100% coverage      Co-Pay:  DME: 80% coverage     Co-Pay: 20% Providers: in-network SECONDARY:       Policy#:      Phone#:   Financial Counselor:       Phone#:   The "Data Collection Information Summary" for patients in Inpatient Rehabilitation Facilities with attached "Privacy Act Statement-Health Care Records" was provided and verbally reviewed with: Patient  Emergency Contact Information Contact Information     Name Relation Home Work Mobile   Mcelroy,Lisa Spouse   [phone]   Buxbaum,Jettie Mother [phone]     Copus,Anellia Daughter [phone]  [phone]   Kanitz,KEYONNA Daughter [phone]         Current Medical History  Patient Admitting Diagnosis: acute CVA History of Present Illness:Craig Knight is a 54-year-old right-handed male with history of hypertension, obesity with BMI 35.25, COPD/tobacco abuse, diabetes mellitus, diastolic congestive heart failure CKD stage III with latest  creatinine 2.40 as well as recent hospitalization 09/06/2020- 09/08/2020 for 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  Maintained on aspirin 81 mg daily and Brilinta 90 mg twice daily plan for 30 days then aspirin 81 mg daily and Plavix 75 mg daily.  He was discharged home ambulating minimal guard 150 feet without assistive device.  Per chart review patient lives with spouse and daughter.  1 level home with 2 steps to entry.  Presented 09/15/2020 to [facility] with nonspecific shortness of breath and worsening right side weakness.  MRI of the brain showed acute left basal ganglia infarction.  Evolving subacute posterior circulation infarct without extension from 09/07/2020.  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  MRA of the head showed diffuse intracranial atherosclerotic disease.  Occlusion right PCA.  Severe stenosis left PCA with distal flow.  This appeared to possibly have progressed since prior studies however difficult to compare given the amount of motion on the MRA.  CTA head neck no emergent large vessel occlusion.  Echocardiogram with ejection fraction of 60 to 65% no wall motion abnormalities.  TEE completed showing no PFO.  Admission chemistries unremarkable except CO2 of 20 BUN 33 creatinine 2.01, WBC 12,800, troponin 33-50, hemoglobin A1c 7.4.  Currently maintained on aspirin 81 mg daily as   well as Brilinta 90 mg twice daily for CVA prophylaxis for 30 days then back to aspirin and Plavix DAPT.  Subcutaneous Lovenox for DVT prophylaxis.  Awaiting plan for loop recorder placement.  Tolerating a regular consistency diet.  Therapy evaluations completed due to patient'Knight increasing right side weakness was admitted for a comprehensive rehab program.   Complete NIHSS TOTAL: 9  Patient'Knight medical record from [facility] has been reviewed by the rehabilitation admission coordinator and  physician.  Past Medical History  Past Medical History:  Diagnosis Date   Asthma    Bronchitis    COPD (chronic obstructive pulmonary disease) (HCC)    Diabetes mellitus without complication (HCC)    Heart murmur 08/24/2020   soft systolic murmur 1/6   Hypertension    Sleep apnea    Stroke (HCC)     Family History   family history includes Heart attack in his maternal grandfather and paternal grandmother; Heart attack (age of onset: 60) in his father; Hypertension in his brother and sister; Stomach cancer in his brother; Stroke in his brother, maternal grandmother, and mother.  Prior Rehab/Hospitalizations Has the patient had prior rehab or hospitalizations prior to admission? Yes  Has the patient had major surgery during 100 days prior to admission? Yes   Current Medications  Current Facility-Administered Medications:    acetaminophen (TYLENOL) tablet 650 mg, 650 mg, Oral, Q4H PRN, 650 mg at 09/18/20 1016 **OR** acetaminophen (TYLENOL) 160 MG/5ML solution 650 mg, 650 mg, Per Tube, Q4H PRN **OR** acetaminophen (TYLENOL) suppository 650 mg, 650 mg, Rectal, Q4H PRN, Elgergawy, Dawood S, MD   aspirin EC tablet 81 mg, 81 mg, Oral, Daily, Elgergawy, Dawood S, MD, 81 mg at 09/19/20 1000   atorvastatin (LIPITOR) tablet 80 mg, 80 mg, Oral, Daily, Elgergawy, Dawood S, MD, 80 mg at 09/19/20 1000   enoxaparin (LOVENOX) injection 40 mg, 40 mg, Subcutaneous, Q24H, Elgergawy, Dawood S, MD, 40 mg at 09/19/20 0959   gabapentin (NEURONTIN) capsule 300 mg, 300 mg, Oral, QHS, Singh, Prashant K, MD, 300 mg at 09/18/20 2148   insulin aspart (novoLOG) injection 0-5 Units, 0-5 Units, Subcutaneous, QHS, Elgergawy, Dawood S, MD   insulin aspart (novoLOG) injection 0-9 Units, 0-9 Units, Subcutaneous, TID WC, Elgergawy, Dawood S, MD, 1 Units at 09/16/20 1711   ipratropium-albuterol (DUONEB) 0.5-2.5 (3) MG/3ML nebulizer solution 3 mL, 3 mL, Nebulization, Q4H PRN, Elgergawy, Dawood S, MD, 3 mL at 09/17/20  1919   mupirocin ointment (BACTROBAN) 2 % 1 application, 1 application, Nasal, BID, Singh, Prashant K, MD, 1 application at 09/19/20 1000   ticagrelor (BRILINTA) tablet 90 mg, 90 mg, Oral, BID, Singh, Prashant K, MD, 90 mg at 09/19/20 1000   traZODone (DESYREL) tablet 100 mg, 100 mg, Oral, QHS, Singh, Prashant K, MD, 100 mg at 09/18/20 2148   umeclidinium-vilanterol (ANORO ELLIPTA) 62.5-25 MCG/INH 1 puff, 1 puff, Inhalation, Daily, Elgergawy, Dawood S, MD, 1 puff at 09/19/20 0847  Patients Current Diet:  Diet Order             Diet Heart Room service appropriate? Yes; Fluid consistency: Thin  Diet effective now                   Precautions / Restrictions Precautions Precautions: Fall Precaution Comments: L hemianopia; Lt hemiparesis Restrictions Weight Bearing Restrictions: No   Has the patient had 2 or more falls or a fall with injury in the past year? No  Prior Activity Level Community (5-7x/wk): drives, leaves house 6 days/week    Prior Functional Level Self Care: Did the patient need help bathing, dressing, using the toilet or eating? Independent  Indoor Mobility: Did the patient need assistance with walking from room to room (with or without device)? Independent  Stairs: Did the patient need assistance with internal or external stairs (with or without device)? Independent  Functional Cognition: Did the patient need help planning regular tasks such as shopping or remembering to take medications? Independent  Home Assistive Devices / Equipment Home Assistive Devices/Equipment: None Home Equipment: None  Prior Device Use: Indicate devices/aids used by the patient prior to current illness, exacerbation or injury? None of the above  Current Functional Level Cognition  Arousal/Alertness: Awake/alert Overall Cognitive Status: Impaired/Different from baseline Current Attention Level: Sustained Orientation Level: Oriented X4 Following Commands: Follows one step commands  inconsistently General Comments: A&Ox4, appears to appropriately be aware of L visual field deficits and difficulty with R sided strength and mobility. Could benefit from further assessment. Attention: Focused Focused Attention: Appears intact Memory: Impaired Memory Impairment: Decreased short term memory Decreased Short Term Memory: Verbal complex Awareness: Appears intact Problem Solving: Appears intact Safety/Judgment: Appears intact    Extremity Assessment (includes Sensation/Coordination)  Upper Extremity Assessment: LUE deficits/detail RUE Deficits / Details: PROM WFL.  Pt with trace activation Lt shoulder and elbow.  He demonstrates isolated thumb movements and active finger extension LUE Coordination: decreased gross motor, decreased fine motor  Lower Extremity Assessment: Defer to PT evaluation RLE Deficits / Details: MMT scores of the following = hip flexion 2+, knee extension 3-, ankle dorsiflexion 2+ RLE Sensation:  (denies numbness/tingling) RLE Coordination: decreased fine motor, decreased gross motor    ADLs  Overall ADL'Knight : Needs assistance/impaired Eating/Feeding: Set up, Sitting Grooming: Wash/dry hands, Wash/dry face, Oral care, Minimal assistance, Sitting Upper Body Bathing: Moderate assistance, Sitting Lower Body Bathing: Maximal assistance, Sit to/from stand Upper Body Dressing : Moderate assistance, Sitting Lower Body Dressing: Maximal assistance, Sit to/from stand Toilet Transfer: Minimal assistance, Stand-pivot, BSC Toileting- Clothing Manipulation and Hygiene: Maximal assistance, Sit to/from stand Functional mobility during ADLs: Minimal assistance    Mobility  Overal bed mobility: Needs Assistance Bed Mobility: Supine to Sit, Sit to Supine Supine to sit: Mod assist Sit to supine: Min assist General bed mobility comments: Pt insists he can't move Rt side.  To get out of bed, he required assist for Rt LE and assist to lift trunk.  Upon return to supine,  he needed assist only for Rt LE - he was able to lift it 1/2 way onto bed    Transfers  Overall transfer level: Needs assistance Equipment used: 1 person hand held assist Transfers: Sit to/from Stand, Stand Pivot Transfers Sit to Stand: Min assist Stand pivot transfers: Mod assist General transfer comment: assist to steady, and assist to pivot Rt foot as well as assist for safetly    Ambulation / Gait / Stairs / Wheelchair Mobility  Ambulation/Gait Ambulation/Gait assistance: Mod assist Gait Distance (Feet): 4 Feet Assistive device: Rolling walker (2 wheeled) Gait Pattern/deviations: Step-to pattern, Decreased step length - right, Decreased stride length, Decreased dorsiflexion - right, Narrow base of support General Gait Details: Pt displaying no R knee buckling, but displaying difficulty lifting foot off ground to complete R foot advancement during swing, needing physical assistance to do so. ModA to steady and assist with R leg stepping anterior and posterior a couple times. Limited gait distance due to transport arriving to take pt to test. Gait velocity: decreased Gait velocity interpretation: <1.31 ft/sec, indicative   of household ambulator    Posture / Balance Dynamic Sitting Balance Sitting balance - Comments: Able to reach min off BOS, but pt displays LOB (to R often) when reaching further to try to reach floor or when shifting weight significantly, needing min guard-minA sitting EOB dynamically. Balance Overall balance assessment: Needs assistance Sitting-balance support: No upper extremity supported, Feet supported Sitting balance-Leahy Scale: Fair Sitting balance - Comments: Able to reach min off BOS, but pt displays LOB (to R often) when reaching further to try to reach floor or when shifting weight significantly, needing min guard-minA sitting EOB dynamically. Postural control: Right lateral lean Standing balance support: Single extremity supported Standing balance-Leahy  Scale: Poor Standing balance comment: reliant on support    Special needs/care consideration Continuous Drip IV  0.9% sodium chloride infusion, Diabetic management novoLOG: 0-5 units daily at bedtime; novoLOG: 0-9 units 3 times a day, and Designated visitor Pinkney Venard, wife   Previous Home Environment (from acute therapy documentation) Living Arrangements: Spouse/significant other, Children  Lives With: Spouse, Daughter Available Help at Discharge: Family, Available 24 hours/day Type of Home: House Home Layout: One level Home Access: Stairs to enter Entrance Stairs-Rails: None Technical brewer of Steps: 2 Bathroom Shower/Tub: Multimedia programmer: Standard Bathroom Accessibility: Yes How Accessible: Accessible via walker [place]: No  Discharge Living Setting Plans for Discharge Living Setting: Patient'Knight home Type of Home at Discharge: House Discharge Home Layout: One level Discharge Home Access: Stairs to enter Entrance Stairs-Rails: None Entrance Stairs-Number of Steps: 2 Discharge Bathroom Shower/Tub: Walk-in shower Discharge Bathroom Toilet: Standard Discharge Bathroom Accessibility: Yes How Accessible: Accessible via walker Does the patient have any problems obtaining your medications?: No  Social/Family/Support Systems Patient Roles: Spouse, Parent Anticipated Caregiver: Quinterious Walraven, wife Caregiver Availability: 24/7  Goals Patient/Family Goal for Rehab: Supervision-Mod I:PT/OT/ST Expected length of stay: 14-21 days Pt/Family Agrees to Admission and willing to participate: Yes Program Orientation Provided & Reviewed with Pt/Caregiver Including Roles  & Responsibilities: Yes  Decrease burden of Care through IP rehab admission: NA  Possible need for SNF placement upon discharge: Not anticipated  Patient Condition: I have reviewed medical records from [facility], spoken with CM, and patient and spouse. I met with  patient at the bedside and discussed via phone for inpatient rehabilitation assessment.  Patient will benefit from ongoing PT, OT, and SLP, can actively participate in 3 hours of therapy a day 5 days of the week, and can make measurable gains during the admission.  Patient will also benefit from the coordinated team approach during an Inpatient Acute Rehabilitation admission.  The patient will receive intensive therapy as well as Rehabilitation physician, nursing, social worker, and care management interventions.  Due to safety, skin/wound care, disease management, medication administration, pain management, and patient education the patient requires 24 hour a day rehabilitation nursing.  The patient is currently min-max A with mobility and basic ADLs.  Discharge setting and therapy post discharge at home with home health is anticipated.  Patient has agreed to participate in the Acute Inpatient Rehabilitation Program and will admit today.  Preadmission Screen Completed By:  Genella Mech, 09/19/2020 10:13 AM ______________________________________________________________________   Discussed status with Dr. Dagoberto Ligas on 09/19/20 at 55 and received approval for admission today.  Admission Coordinator:  Genella Mech, CCC-SLP, time 10:59 am/Date 09/19/20   Assessment/Plan: Diagnosis: Does the need for close, 24 hr/day Medical supervision in concert with the patient'Knight rehab needs make it unreasonable for this patient to  be served in a less intensive setting? Yes Co-Morbidities requiring supervision/potential complications: R PCA stroke, L BG stroke with R and L hemiparesis; COPD, HTN, BMI of 35; DM A1c of 7.4; dCHF; and Cr of 2.4 Due to bladder management, bowel management, safety, skin/wound care, disease management, medication administration, and patient education, does the patient require 24 hr/day rehab nursing? Yes Does the patient require coordinated care of a physician, rehab nurse, PT, OT, and SLP to  address physical and functional deficits in the context of the above medical diagnosis(es)? Yes Addressing deficits in the following areas: balance, endurance, locomotion, strength, transferring, bowel/bladder control, bathing, dressing, feeding, grooming, toileting, cognition, speech, language, and swallowing Can the patient actively participate in an intensive therapy program of at least 3 hrs of therapy 5 days a week? Yes The potential for patient to make measurable gains while on inpatient rehab is good Anticipated functional outcomes upon discharge from inpatient rehab: modified independent and supervision PT, modified independent and supervision OT, modified independent and supervision SLP Estimated rehab length of stay to reach the above functional goals is: 14-21 days Anticipated discharge destination: Home 10. Overall Rehab/Functional Prognosis: good   MD Signature:  

## 2020-09-16 NOTE — Progress Notes (Signed)
    [facility] has been requested to perform a transesophageal echocardiogram on 09/18/20 for CVA.  After careful review of history and examination, the risks and benefits of transesophageal echocardiogram have been explained including risks of esophageal damage, perforation (1:10,000 risk), bleeding, pharyngeal hematoma as well as other potential complications associated with conscious sedation including aspiration, arrhythmia, respiratory failure and death. Alternatives to treatment were discussed, questions were answered. Patient is willing to proceed.   TEE tentatively planned for 09/18/20 with Dr. Debara Pickett - timing TBD.   Roby Lofts, PA-C 09/16/2020 3:19 PM

## 2020-09-16 NOTE — Progress Notes (Signed)
Inpatient Rehab Admissions Coordinator Note:   Per PT recommendation, pt was screened for CIR candidacy by Gayland Curry, MS, CCC-SLP.  At this time we are recommending an inpatient rehab consult.  AC will place consult order per protocol.  Please contact me with questions.     Gayland Curry, MS, [place] Admissions Coordinator [phone] 09/16/20 1:26 PM

## 2020-09-16 NOTE — Progress Notes (Signed)
Pt returned to unit at [facility], report received from [facility]. Immediately after, Dr. Erlinda Hong phoned RN informing stat CTA ordered. RN and Location manager transported pt to CT, procedures done. RN transported pt to unit. Vitals and neuro checks WNL. Sleeping comfortably at this time.

## 2020-09-16 NOTE — Plan of Care (Signed)
  Problem: Education: Goal: Knowledge of disease or condition will improve Outcome: Progressing Goal: Knowledge of secondary prevention will improve Outcome: Progressing Goal: Knowledge of patient specific risk factors addressed and post discharge goals established will improve Outcome: Progressing   

## 2020-09-16 NOTE — Progress Notes (Addendum)
PROGRESS NOTE                                                                                                                                                                                                             Patient Demographics:    Craig Knight, is a 55 y.o. male, DOB - [birth year], [id number]  Outpatient Primary MD for the patient is Rosita Fire, MD    LOS - 1  Admit date - 09/15/2020    Chief Complaint  Patient presents with   Shortness of Breath       Brief Narrative (HPI from H&P)  Craig Knight  is a 55 y.o. male, HTN, DM-2, diastolic CHF, [id number], COPD ,large R PCA stroke and left pontine stroke last week with residual L hemianopsia and mild right sided weakness and right facial droop, he was already on aspirin Plavix and statin and presented to [facility] with worsening right-sided weakness and facial droop, also mild COPD exacerbation.  He was found to have a new stroke and was transferred to [facility] for further treatment.   Subjective:    Craig Knight today has, No headache, No chest pain, No abdominal pain - No Nausea, No new weakness tingling or numbness, no SOB, +ve R sided weakness and facial droop.   Assessment  & Plan :     Acute left basal ganglia infarct. 2. Evolving subacute posterior circulation infarcts without extension from the 09/07/2020 MRI. 3. Moderate chronic small vessel ischemic disease with multiple old infarcts - in in a patient with recent right PCA occlusion and stroke who was already on aspirin Plavix and statin.  He is now on aspirin Brilinta and statin, neurology following, unfortunately not a candidate for tPA or interventional neurology intervention.  IV fluid added for 12 hours to push his blood pressure higher in hopes of some help.  Case discussed with stroke team Dr. Erlinda Hong, continue supportive care and monitor. Explained the situation to wife and patient  in detail, strictly counseled to quit smoking.   2. Incidental Thyroid nodules measuring up to 1.5 cm. - outpt PCP directed Thyroid US   3.  Ongoing smoking.  Strictly counseled to quit.  He has been abstaining for the last 3 weeks.  4.  COPD.  Stable supportive care.  5.  Hypertension.  Permissive hypertension due to stroke.  6.  AKI on CKD stage IIIa.  Baseline creatinine around 2, improving with hydration.  7.  OSA - CPAP QHS  8. DM type II.  On sliding scale  Lab Results  Component Value Date   HGBA1C 7.6 (H) 09/07/2020   .Marland KitchenCBG (last 3)  Recent Labs    09/15/20 2244 09/16/20 0640  GLUCAP 148* 122*    Lab Results  Component Value Date   CHOL 115 09/16/2020   HDL 33 (L) 09/16/2020   LDLCALC 67 09/16/2020   TRIG 77 09/16/2020   CHOLHDL 3.5 09/16/2020           Condition - Extremely Guarded  Family Communication  :  Wife [name] on 09/16/20  Code Status :  Full  Consults  :  Neuro  PUD Prophylaxis :    Procedures  :     MRI - 1. Acute left basal ganglia infarct. 2. Evolving subacute posterior circulation infarcts without extension from the 09/07/2020 MRI. 3. Moderate chronic small vessel ischemic disease with multiple old infarcts  CTA - 1. No emergent large vessel occlusion. 2. Unchanged right PCA occlusion. 3. Advanced intracranial atherosclerosis including severe proximal left PCA and left A3 stenoses which appear increased from the prior CTA. 4. Widely patent cervical carotid arteries. 5. Thyroid nodules measuring up to 1.5 cm. Recommend thyroid US (ref: J Am Coll Radiol. 2015 Feb;12(2): 143-50). CT PERFUSION: No evidence of acute core infarct or penumbra identified by automated processing.  TTE 2 weeks ago - 1. Left ventricular ejection fraction, by estimation, is 60 to 65%. The left ventricle has normal function. The left ventricle has no regional wall motion abnormalities. There is severe left ventricular hypertrophy. Left ventricular diastolic parameters   are consistent with Grade I diastolic dysfunction (impaired relaxation).  2. Right ventricular systolic function is normal. The right ventricular size is normal. Tricuspid regurgitation signal is inadequate for assessing PA pressure.  3. The mitral valve is normal in structure. No evidence of mitral valve regurgitation. No evidence of mitral stenosis.  4. The aortic valve has an indeterminant number of cusps. Aortic valve regurgitation is not visualized. No aortic stenosis is present.      Disposition Plan  :    Status is: Inpatient  Remains inpatient appropriate because:IV treatments appropriate due to intensity of illness or inability to take PO  Dispo: The patient is from: Home              Anticipated d/c is to: Home              Patient currently is not medically stable to d/c.   Difficult to place patient No  DVT Prophylaxis  :    enoxaparin (LOVENOX) injection 40 mg Start: 09/16/20 1000 SCD's Start: 09/15/20 2239    Lab Results  Component Value Date   PLT 284 09/15/2020    Diet :  Diet Order             Diet Heart Room service appropriate? Yes; Fluid consistency: Thin  Diet effective now                    Inpatient Medications  Scheduled Meds:  aspirin EC  81 mg Oral Daily   atorvastatin  80 mg Oral Daily  enoxaparin (LOVENOX) injection  40 mg Subcutaneous Q24H   insulin aspart  0-5 Units Subcutaneous QHS   insulin aspart  0-9 Units Subcutaneous TID WC   ticagrelor  90 mg Oral BID   umeclidinium-vilanterol  1 puff Inhalation Daily   Continuous Infusions: PRN Meds:.acetaminophen **OR** acetaminophen (TYLENOL) oral liquid 160 mg/5 mL **OR** acetaminophen, ipratropium-albuterol  Antibiotics  :    Anti-infectives (From admission, onward)    None        Time Spent in minutes  30   Lala Lund M.D on 09/16/2020 at 12:01 PM  To page go to [email or web address]   [facility] -  Office  [phone]   See all Orders from today for further  details    Objective:   Vitals:   09/16/20 0309 09/16/20 0539 09/16/20 0741 09/16/20 0826  BP: 139/88 (!) 143/55  (!) 132/95  Pulse: 72 72  70  Resp: '17 18  18  '$ Temp: 97.7 F (36.5 C) 98.5 F (36.9 C)  98.2 F (36.8 C)  TempSrc: Oral Oral  Oral  SpO2: 100% 96% 98% 99%  Weight:      Height:        Wt Readings from Last 3 Encounters:  09/15/20 102.1 kg  09/13/20 102.1 kg  09/05/20 104.3 kg    No intake or output data in the 24 hours ending 09/16/20 1201   Physical Exam  Awake Alert, No new F.N deficits, Normal affect [place].AT,PERRAL Supple Neck,No JVD, No cervical lymphadenopathy appriciated.  Symmetrical Chest wall movement, Good air movement bilaterally, CTAB RRR,No Gallops,Rubs or new Murmurs, No Parasternal Heave +ve B.Sounds, Abd Soft, No tenderness, No organomegaly appriciated, No rebound - guarding or rigidity. No Cyanosis, Clubbing or edema, No new Rash or bruise        Data Review:    CBC Recent Labs  Lab 09/13/20 1949 09/15/20 1348  WBC 14.4* 12.8*  HGB 14.0 14.9  HCT 42.7 43.9  PLT 258 284  MCV 81.0 80.6  MCH 26.6 27.3  MCHC 32.8 33.9  RDW 14.2 14.2  LYMPHSABS 1.7 1.1  MONOABS 1.0 1.0  EOSABS 0.1 0.1  BASOSABS 0.1 0.0    Recent Labs  Lab 09/13/20 1949 09/15/20 1348  NA 136 137  K 3.5 3.5  CL 104 106  CO2 20* 20*  GLUCOSE 98 88  BUN 31* 33*  CREATININE 2.35* 2.01*  CALCIUM 9.4 9.7  AST  --  20  ALT  --  19  ALKPHOS  --  87  BILITOT  --  0.9  ALBUMIN  --  4.4  INR  --  1.0    ------------------------------------------------------------------------------------------------------------------ Recent Labs    09/16/20 0406  CHOL 115  HDL 33*  LDLCALC 67  TRIG 77  CHOLHDL 3.5    Lab Results  Component Value Date   HGBA1C 7.6 (H) 09/07/2020   ------------------------------------------------------------------------------------------------------------------ No results for input(s): TSH, T4TOTAL, T3FREE, THYROIDAB in the last  72 hours.  Invalid input(s): FREET3  Cardiac Enzymes No results for input(s): CKMB, TROPONINI, MYOGLOBIN in the last 168 hours.  Invalid input(s): CK ------------------------------------------------------------------------------------------------------------------    Component Value Date/Time   BNP 48.0 06/07/2015 0115    Radiology Reports CT Angio Head W or Wo Contrast  Result Date: 09/06/2020 CLINICAL DATA:  Right-sided weakness, left-sided vision impairment. EXAM: CT ANGIOGRAPHY HEAD AND NECK TECHNIQUE: Multidetector CT imaging of the head and neck was performed using the standard protocol during bolus administration of intravenous contrast. Multiplanar CT image reconstructions and MIPs were  obtained to evaluate the vascular anatomy. Carotid stenosis measurements (when applicable) are obtained utilizing NASCET criteria, using the distal internal carotid diameter as the denominator. CONTRAST:  77m OMNIPAQUE IOHEXOL 350 MG/ML SOLN COMPARISON:  CT angiogram of the head and neck November 23, 2018. FINDINGS: CTA NECK FINDINGS Aortic arch: Common origin of the innominate and left common carotid artery from the aortic arch. Imaged portion shows no evidence of aneurysm or dissection. No significant stenosis of the major arch vessel origins. Right carotid system: Increased tortuosity of the cervical segment of the right ICA. No significant stenosis. Left carotid system: Mild atherosclerotic changes of the left carotid bifurcation without hemodynamically significant stenosis. Increased tortuosity of the cervical segment of the left ICA. Vertebral arteries: Codominant. No evidence of dissection, stenosis (50% or greater) or occlusion. Skeleton: No acute osseous abnormality. Periapical lucency at the right maxillary first molar tooth. Other neck: Enlarged thyroid gland with multiple hypodense nodules, the largest measuring up to 1.5 cm. Upper chest: Negative. Review of the MIP images confirms the above  findings CTA HEAD FINDINGS Anterior circulation: Mild atherosclerotic changes of the bilateral carotid siphon without stenosis. Luminal irregularity seen along the bilateral MCA vascular trees with focus of moderate stenosis at a left M3/MCA superior division branch and mild stenosis at a right M3/MCA inferior division branch. There is also luminal irregularity in the bilateral ACA vascular trees with focal area of moderate stenosis at the right A3/ACA segment. Fenestration of the left A1/ACA again noted. Posterior circulation: Atherosclerotic changes of the bilateral intracranial vertebral arteries with moderate stenosis on the left. No significant stenosis on the right. The basilar artery has normal course and caliber. There is an occlusion of the right PCA at the P1-P2 junction with poor distal flow. Atherosclerotic changes of the left posterior cerebral artery with mild-to-moderate stenosis at the P2 segment. Venous sinuses: As permitted by contrast timing, patent. Anatomic variants: Fenestration of the left A1/ACA. Review of the MIP images confirms the above findings IMPRESSION: 1. Positive for large vessel occlusion at the right P1-P2/PCA junction. 2. Intracranial atherosclerotic disease with moderate stenosis at a left M3/MCA superior division branch, mild stenosis at a right M3/MCA inferior division branch, moderate stenosis at the right A3/ACA segment, moderate stenosis at the left vertebral artery and mild-to-moderate stenosis at a left P2/PCA. 3. No significant atherosclerotic disease in the neck. These results were called by telephone at the time of interpretation on 09/06/2020 at 5:19 pm to Dr. XErlinda Hong who verbally acknowledged these results. Electronically Signed   By: KPedro EarlsM.D.   On: 09/06/2020 17:42   DG Chest 2 View  Result Date: 09/13/2020 CLINICAL DATA:  Shortness of breath.  Right-sided weakness. EXAM: CHEST - 2 VIEW COMPARISON:  Radiograph 09/29/2018, chest CT 06/27/2020  FINDINGS: Lung volumes are low. Mild cardiomegaly. Bandlike scarring at the left lung base. No acute or confluent airspace disease. No pulmonary edema. No pleural effusion or pneumothorax. No acute osseous abnormalities are seen. IMPRESSION: Low lung volumes with mild cardiomegaly and left basilar scarring. Electronically Signed   By: MKeith RakeM.D.   On: 09/13/2020 19:57   CT Head Wo Contrast  Result Date: 09/13/2020 CLINICAL DATA:  Right leg weakness EXAM: CT HEAD WITHOUT CONTRAST TECHNIQUE: Contiguous axial images were obtained from the base of the skull through the vertex without intravenous contrast. COMPARISON:  MRI 09/07/2020, CT 09/06/2020 FINDINGS: Brain: Mild residual hypodensity within the right occipital and posterior temporal lobes as well as splenium corpus callosum consistent  with evolving right PCA infarct. No definitive hemorrhage seen. Hypodensity in the left pons, also consistent with evolving infarct. Negative for intracranial mass. Chronic left frontal lobe infarct. Chronic bilateral white matter and basal ganglial infarcts. Chronic lacunar infarcts in the thalamus. Stable ventricle size. Chronic small vessel ischemic changes of the white matter. Vascular: No hyperdense vessels. Mild carotid vascular calcification. Vertebral artery calcification Skull: Normal. Negative for fracture or focal lesion. Sinuses/Orbits: Mucosal thickening in the sinuses Other: None IMPRESSION: 1. Evolving right PCA infarct without interval hemorrhage. Evolving left pontine infarct without interval hemorrhage. No definite new acute intracranial abnormality since prior study from 09/06/2020. 2. Atrophy with chronic small vessel ischemic changes of the white matter. Multiple chronic infarcts involving left frontal lobe, bilateral white matter, basal ganglia and thalamus. Electronically Signed   By: Donavan Foil M.D.   On: 09/13/2020 20:24   CT Angio Neck W and/or Wo Contrast  Result Date:  09/06/2020 CLINICAL DATA:  Right-sided weakness, left-sided vision impairment. EXAM: CT ANGIOGRAPHY HEAD AND NECK TECHNIQUE: Multidetector CT imaging of the head and neck was performed using the standard protocol during bolus administration of intravenous contrast. Multiplanar CT image reconstructions and MIPs were obtained to evaluate the vascular anatomy. Carotid stenosis measurements (when applicable) are obtained utilizing NASCET criteria, using the distal internal carotid diameter as the denominator. CONTRAST:  27m OMNIPAQUE IOHEXOL 350 MG/ML SOLN COMPARISON:  CT angiogram of the head and neck November 23, 2018. FINDINGS: CTA NECK FINDINGS Aortic arch: Common origin of the innominate and left common carotid artery from the aortic arch. Imaged portion shows no evidence of aneurysm or dissection. No significant stenosis of the major arch vessel origins. Right carotid system: Increased tortuosity of the cervical segment of the right ICA. No significant stenosis. Left carotid system: Mild atherosclerotic changes of the left carotid bifurcation without hemodynamically significant stenosis. Increased tortuosity of the cervical segment of the left ICA. Vertebral arteries: Codominant. No evidence of dissection, stenosis (50% or greater) or occlusion. Skeleton: No acute osseous abnormality. Periapical lucency at the right maxillary first molar tooth. Other neck: Enlarged thyroid gland with multiple hypodense nodules, the largest measuring up to 1.5 cm. Upper chest: Negative. Review of the MIP images confirms the above findings CTA HEAD FINDINGS Anterior circulation: Mild atherosclerotic changes of the bilateral carotid siphon without stenosis. Luminal irregularity seen along the bilateral MCA vascular trees with focus of moderate stenosis at a left M3/MCA superior division branch and mild stenosis at a right M3/MCA inferior division branch. There is also luminal irregularity in the bilateral ACA vascular trees with  focal area of moderate stenosis at the right A3/ACA segment. Fenestration of the left A1/ACA again noted. Posterior circulation: Atherosclerotic changes of the bilateral intracranial vertebral arteries with moderate stenosis on the left. No significant stenosis on the right. The basilar artery has normal course and caliber. There is an occlusion of the right PCA at the P1-P2 junction with poor distal flow. Atherosclerotic changes of the left posterior cerebral artery with mild-to-moderate stenosis at the P2 segment. Venous sinuses: As permitted by contrast timing, patent. Anatomic variants: Fenestration of the left A1/ACA. Review of the MIP images confirms the above findings IMPRESSION: 1. Positive for large vessel occlusion at the right P1-P2/PCA junction. 2. Intracranial atherosclerotic disease with moderate stenosis at a left M3/MCA superior division branch, mild stenosis at a right M3/MCA inferior division branch, moderate stenosis at the right A3/ACA segment, moderate stenosis at the left vertebral artery and mild-to-moderate stenosis at a left P2/PCA.  3. No significant atherosclerotic disease in the neck. These results were called by telephone at the time of interpretation on 09/06/2020 at 5:19 pm to Dr. Erlinda Hong, who verbally acknowledged these results. Electronically Signed   By: Pedro Earls M.D.   On: 09/06/2020 17:42   MR ANGIO HEAD WO CONTRAST  Result Date: 09/15/2020 CLINICAL DATA:  Stroke EXAM: MRA HEAD WITHOUT CONTRAST TECHNIQUE: Angiographic images of the Circle of Willis were acquired using MRA technique without intravenous contrast. COMPARISON:  MRI head 09/15/2020.  CT a 09/06/2020 FINDINGS: Anterior circulation: Motion degraded study. This significantly limits evaluation for small vessel disease. Internal carotid artery patent bilaterally without significant stenosis. Irregular areas of stenosis in the anterior and middle cerebral artery branches bilaterally consistent with  widespread atherosclerotic disease. This is confirmed on the prior CTA. No large vessel occlusion. Posterior circulation: Both vertebral arteries are patent to the basilar. Basilar patent. Superior cerebellar arteries patent bilaterally. Occlusion of the proximal right posterior cerebral artery corresponding to right PCA infarct on MRI. There is severe stenosis of the left posterior cerebral artery which has distal flow. This appears progressive from the prior CT angiogram. Differences could be due to artifact from motion. Anatomic variants: None Other: None IMPRESSION: Motion degraded study Diffuse intracranial atherosclerotic disease, moderate to severe Occlusion right PCA. Severe stenosis left PCA with distal flow. This may have progressed since prior studies however is difficult to compare given the amount of motion on the MRA. Electronically Signed   By: Franchot Gallo M.D.   On: 09/15/2020 16:32   MR BRAIN WO CONTRAST  Result Date: 09/15/2020 CLINICAL DATA:  Transient ischemic attack (TIA). Right leg weakness. EXAM: MRI HEAD WITHOUT CONTRAST TECHNIQUE: Multiplanar, multiecho pulse sequences of the brain and surrounding structures were obtained without intravenous contrast. COMPARISON:  Head CT 09/13/2020 and MRI 09/07/2020 FINDINGS: Brain: There is a new acute left lateral lenticulostriate territory infarct extending from the posterior aspect of the left lentiform nucleus to the caudate body and involving the intervening deep white matter. There is residual diffusion weighted signal abnormality at the sites of the posterior circulation infarcts on the prior MRI (right temporal and occipital lobes, splenium of the corpus callosum, and left pons) without evidence of significant interval infarct extension. There is minimal petechial hemorrhage in the right occipital lobe. A chronic microhemorrhage is noted in the anterior right frontal lobe. Chronic lacunar infarcts are again noted involving the deep cerebral  white matter bilaterally, basal ganglia, and thalami, and there is a chronic moderate-sized left MCA infarct near the anterior aspect of the left frontal operculum. T2 hyperintensities elsewhere in the cerebral white matter bilaterally are unchanged and nonspecific but compatible with moderate chronic small vessel ischemic disease. No mass, midline shift, or extra-axial fluid collection is identified. There is mild cerebral atrophy. Vascular: Major intracranial vascular flow voids are preserved. Skull and upper cervical spine: Unremarkable bone marrow signal. Sinuses/Orbits: Unremarkable orbits. Minimal mucosal thickening or small mucous retention cyst in left sphenoid sinus. Small right and moderate left mastoid effusions. Other: None. IMPRESSION: 1. Acute left basal ganglia infarct. 2. Evolving subacute posterior circulation infarcts without extension from the 09/07/2020 MRI. 3. 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 Electronically Signed   By: Logan Bores M.D.   On: 09/15/2020 14:51   MR BRAIN WO CONTRAST  Result Date: 09/07/2020 CLINICAL DATA:  Stroke, follow-up.  Slurred speech. EXAM: MRI HEAD WITHOUT CONTRAST TECHNIQUE: Multiplanar, multiecho pulse sequences of the brain and surrounding  structures were obtained without intravenous contrast. COMPARISON:  CT code stroke 09/06/2020. FINDINGS: Brain: Acute right PCA territory infarcts involving the right occipital lobe, parahippocampal/medial right temporal lobe, and splenium of the corpus callosum. Punctate acute infarct in the dorsal lateral. Right thalamus. There is associated edema and regional mass effect without midline shift. Additional left pontine acute infarct. No evidence of hemorrhagic transformation. Remote left frontal infarct with encephalomalacia and gliosis. Remote lacunar infarcts in bilateral basal ganglia,, corona radiata and thalami. Additional moderate scattered T2/FLAIR hyperintensities within the  white matter, most likely related to chronic microvascular ischemic disease. Atrophy. Similar mild ventriculomegaly. Mild crowding of sulci at the vertex. No mass lesion. No midline shift. Basal cisterns are patent. No extra-axial fluid collection. Vascular: See recent CTA for further evaluation. Skull and upper cervical spine: Normal marrow signal. Sinuses/Orbits: Left sphenoid sinus retention cyst. Mild ethmoid air cell mucosal thickening. No acute orbital findings. Other: Moderate bilateral mastoid effusions. IMPRESSION: 1. Acute right PCA territory infarcts, as described above. 2. Acute left pontine infarct. 3. Associated edema and regional mass effect without midline shift. No hemorrhagic transformation. 4. Remote left frontal infarct and remote lacunar infarcts in bilateral basal ganglia,, corona radiata and thalami. Moderate chronic microvascular ischemic disease. 5. Similar mild ventriculomegaly, most likely related to atrophy. Normal pressure hydrocephalus is thought less likely, but is a differential consideration. Electronically Signed   By: Margaretha Sheffield MD   On: 09/07/2020 09:22   DG Chest Port 1 View  Result Date: 09/15/2020 CLINICAL DATA:  Pt has history of 4 previous strokes since Dec. Pt typically has SOB, but worsened yesterday. Hx of COPD, diabetes, HTN. Current smoker. EXAM: PORTABLE CHEST 1 VIEW COMPARISON:  Chest x-ray 09/13/2020, CT chest 06/27/2020 FINDINGS: Enlarged cardiac silhouette. The heart size and mediastinal contours are unchanged. No focal consolidation. No pulmonary edema. No pleural effusion. No pneumothorax. No acute osseous abnormality. IMPRESSION: No active disease. Electronically Signed   By: Iven Finn M.D.   On: 09/15/2020 15:32   ECHOCARDIOGRAM COMPLETE  Result Date: 09/07/2020    ECHOCARDIOGRAM REPORT   Patient Name:   SLATON COURTER Date of Exam: 09/07/2020 Medical Rec #:  [id number]       Height:       67.0 in Accession #:    [id number]      Weight:        230.0 lb Date of Birth:  1965/05/17       BSA:          2.146 m Patient Age:    21 years        BP:           130/92 mmHg Patient Gender: M               HR:           62 bpm. Exam Location:  [facility] Procedure: 2D Echo, Cardiac Doppler and Color Doppler Indications:    Stroke  History:        Patient has prior history of Echocardiogram examinations, most                 recent 03/17/2017. CHF, Stroke and COPD, Signs/Symptoms:Shortness                 of Breath and Chest Pain; Risk Factors:Hypertension, Diabetes                 and Current Smoker.  Sonographer:    Wenda Low Referring Phys: [phone]  EJIROGHENE E EMOKPAE IMPRESSIONS  1. Left ventricular ejection fraction, by estimation, is 60 to 65%. The left ventricle has normal function. The left ventricle has no regional wall motion abnormalities. There is severe left ventricular hypertrophy. Left ventricular diastolic parameters  are consistent with Grade I diastolic dysfunction (impaired relaxation).  2. Right ventricular systolic function is normal. The right ventricular size is normal. Tricuspid regurgitation signal is inadequate for assessing PA pressure.  3. The mitral valve is normal in structure. No evidence of mitral valve regurgitation. No evidence of mitral stenosis.  4. The aortic valve has an indeterminant number of cusps. Aortic valve regurgitation is not visualized. No aortic stenosis is present. FINDINGS  Left Ventricle: Left ventricular ejection fraction, by estimation, is 60 to 65%. The left ventricle has normal function. The left ventricle has no regional wall motion abnormalities. The left ventricular internal cavity size was normal in size. There is  severe left ventricular hypertrophy. Left ventricular diastolic parameters are consistent with Grade I diastolic dysfunction (impaired relaxation). Normal left ventricular filling pressure. Right Ventricle: The right ventricular size is normal. No increase in right ventricular wall thickness.  Right ventricular systolic function is normal. Tricuspid regurgitation signal is inadequate for assessing PA pressure. Left Atrium: Left atrial size was normal in size. Right Atrium: Right atrial size was normal in size. Pericardium: There is no evidence of pericardial effusion. Mitral Valve: The mitral valve is normal in structure. No evidence of mitral valve regurgitation. No evidence of mitral valve stenosis. MV peak gradient, 3.7 mmHg. The mean mitral valve gradient is 1.0 mmHg. Tricuspid Valve: The tricuspid valve is normal in structure. Tricuspid valve regurgitation is not demonstrated. No evidence of tricuspid stenosis. Aortic Valve: The aortic valve has an indeterminant number of cusps. Aortic valve regurgitation is not visualized. No aortic stenosis is present. Aortic valve mean gradient measures 4.0 mmHg. Aortic valve peak gradient measures 6.9 mmHg. Aortic valve area, by VTI measures 2.38 cm. Pulmonic Valve: The pulmonic valve was not well visualized. Pulmonic valve regurgitation is not visualized. No evidence of pulmonic stenosis. Aorta: The aortic root is normal in size and structure. Venous: The inferior vena cava was not well visualized. IAS/Shunts: No atrial level shunt detected by color flow Doppler.  LEFT VENTRICLE PLAX 2D LVIDd:         4.50 cm  Diastology LVIDs:         2.80 cm  LV e' medial:    6.05 cm/s LV PW:         1.93 cm  LV E/e' medial:  9.9 LV IVS:        1.94 cm  LV e' lateral:   4.88 cm/s LVOT diam:     2.00 cm  LV E/e' lateral: 12.3 LV SV:         68 LV SV Index:   32 LVOT Area:     3.14 cm  LEFT ATRIUM             Index       RIGHT ATRIUM           Index LA diam:        4.60 cm 2.14 cm/m  RA Area:     15.50 cm LA Vol (A2C):   28.4 ml 13.23 ml/m RA Volume:   39.20 ml  18.27 ml/m LA Vol (A4C):   48.8 ml 22.74 ml/m LA Biplane Vol: 36.9 ml 17.19 ml/m  AORTIC VALVE AV Area (Vmax):    3.09 cm AV  Area (Vmean):   3.16 cm AV Area (VTI):     2.38 cm AV Vmax:           131.00 cm/s AV  Vmean:          88.600 cm/s AV VTI:            0.285 m AV Peak Grad:      6.9 mmHg AV Mean Grad:      4.0 mmHg LVOT Vmax:         129.00 cm/s LVOT Vmean:        89.200 cm/s LVOT VTI:          0.216 m LVOT/AV VTI ratio: 0.76  AORTA Ao Root diam: 3.85 cm Ao Asc diam:  3.40 cm MITRAL VALVE MV Area (PHT): 2.76 cm    SHUNTS MV Area VTI:   1.90 cm    Systemic VTI:  0.22 m MV Peak grad:  3.7 mmHg    Systemic Diam: 2.00 cm MV Mean grad:  1.0 mmHg MV Vmax:       0.96 m/s MV Vmean:      45.1 cm/s MV Decel Time: 275 msec MV E velocity: 59.90 cm/s MV A velocity: 86.70 cm/s MV E/A ratio:  0.69 Carlyle Dolly MD Electronically signed by Carlyle Dolly MD Signature Date/Time: 09/07/2020/2:40:42 PM    Final    CT HEAD CODE STROKE WO CONTRAST  Result Date: 09/06/2020 CLINICAL DATA:  Code stroke.  Neuro deficit, acute stroke suspected. EXAM: CT HEAD WITHOUT CONTRAST TECHNIQUE: Contiguous axial images were obtained from the base of the skull through the vertex without intravenous contrast. COMPARISON:  CT head 11/22/2018. FINDINGS: Brain: Acute right PCA territory infarcts, including the right occipital lobe, right mesial temporal lobe and potentially the right thalamus and right aspect of the splenium of the corpus callosum. Associated edema and local mass effect. No midline shift. Mild hyperdensity in the right occipital lobe. Remote infarcts in the left frontal lobe with encephalomalacia. Remote infarcts in bilateral corona radiata and basal ganglia. Additional moderate patchy white matter hypoattenuation, most likely chronic microvascular ischemic disease. Similar atrophy. Similar mild ventriculomegaly, mildly disproportionate to the degree of sulcal enlargement. Additionally there is similar crowding of sulci at the vertex and a mildly acute callosal angle. Vascular: Subtle hyperdensity within the right PCA (series 5, image 36; series 6, image 30), potentially thrombus. Intracranial atherosclerosis. Skull: No acute  fracture. Sinuses/Orbits: Sinuses are largely clear.  Unremarkable orbits. Other: No mastoid effusions. IMPRESSION: 1. Acute right PCA territory infarcts, including the right occipital lobe, right mesial/parahippocampal temporal lobe and potentially the right thalamus and right aspect of the splenium of the corpus callosum. Associated edema and local mass effect without midline shift. 2. Mild hyperdensity in the right occipital lobe may represent spared cortex and/or petechial hemorrhage and warrants attention on follow-up. 3. Subtle hyperdensity within the right PCA (series 5, image 36; series 6, image 30), concerning for thrombus given the above findings. Recommend CTA. 4. Remote infarcts in the left frontal lobe and remote lacunar infarcts in bilateral corona radiata and basal ganglia. 5. Moderate chronic microvascular ischemic disease. 6. Similar mild ventriculomegaly, mildly disproportionate to the degree of sulcal enlargement. Findings could be secondary to central predominant volume loss versus normal pressure hydrocephalus. Code stroke imaging results were communicated on 09/06/2020 at 4:46 pm to provider Dr. Laverta Baltimore via telephone, who verbally acknowledged these results. Electronically Signed   By: Margaretha Sheffield MD   On: 09/06/2020 16:59   CT ANGIO  HEAD NECK W WO CM W PERF  Result Date: 09/16/2020 CLINICAL DATA:  Stroke follow-up.  Right arm numbness. EXAM: CT ANGIOGRAPHY HEAD AND NECK CT PERFUSION BRAIN TECHNIQUE: Multidetector CT imaging of the head and neck was performed using the standard protocol during bolus administration of intravenous contrast. Multiplanar CT image reconstructions and MIPs were obtained to evaluate the vascular anatomy. Carotid stenosis measurements (when applicable) are obtained utilizing NASCET criteria, using the distal internal carotid diameter as the denominator. Multiphase CT imaging of the brain was performed following IV bolus contrast injection. Subsequent parametric  perfusion maps were calculated using RAPID software. CONTRAST:  167m OMNIPAQUE IOHEXOL 350 MG/ML SOLN COMPARISON:  Head MRI and MRA 09/15/2020. Head and neck CTA 09/06/2020. FINDINGS: CT HEAD FINDINGS Brain: Hypodensity in the posterior left basal ganglia/posterior limb of internal capsule/corona radiata corresponds to the acute infarct which is better demonstrated on yesterday's MRI. No acute cortically based infarct, mass, midline shift, or extra-axial fluid collection is identified. Multiple subacute posterior circulation infarcts are again noted involving the posteromedial right temporal and occipital lobes, splenium of the corpus callosum, and left pons with minimal petechial hemorrhage again noted in the right occipital lobe. There is a chronic moderate-sized left frontal lobe MCA infarct, and there are moderate chronic small-vessel changes in the cerebral white matter with chronic lacunar infarcts in the deep cerebral white matter bilaterally, basal ganglia, and thalami. There is mild cerebral atrophy. Vascular: No hyperdense vessel. Skull: No fracture or suspicious osseous lesion. Sinuses/Orbits: Small mucous retention cyst in the left sphenoid sinus. Small to moderate mastoid effusions. Unremarkable orbits. Other: None. CTA NECK FINDINGS Aortic arch: Normal variant aortic arch branching pattern with common origin of the brachiocephalic and left common carotid arteries. Widely patent arch vessel origins. Right carotid system: Patent without evidence of stenosis, dissection, or significant atherosclerosis. Tortuous cervical ICA. Left carotid system: Patent with a small amount of calcified plaque at the carotid bifurcation. No evidence of a significant stenosis or dissection. Tortuous cervical ICA. Vertebral arteries: Patent and codominant without evidence of a significant stenosis or dissection. Skeleton: Mild cervical spondylosis. Other neck: Bilateral thyroid nodules measuring up to 1.5 cm. No evidence of  cervical lymphadenopathy. Upper chest: Clear lung apices. Review of the MIP images confirms the above findings CTA HEAD FINDINGS Anterior circulation: The internal carotid arteries are patent from skull base to carotid termini with mild atherosclerosis not resulting in a significant stenosis. ACAs and MCAs are patent without evidence of a proximal branch occlusion. There is widespread atherosclerotic type irregularity including an unchanged mild proximal left M1 stenosis. Severe left A3 stenoses appear increased from the prior CTA. No aneurysm is identified. Posterior circulation: The intracranial vertebral arteries are patent to the basilar with atherosclerotic plaque resulting in an unchanged moderate stenosis on the left. The a severe proximal right AICA stenosis is again seen. Patent SCA is are identified bilaterally. The basilar artery is widely patent. There is a small left posterior communicating artery. There is unchanged occlusion of the right PCA near the P1-P2 junction. The left PCA is patent but diffusely irregular with a severe stenosis in the proximal P2 segment which appears increased from the prior CTA but less extensive than what was suggested on the more recent MRA. No aneurysm is identified. Venous sinuses: Hypoplastic and poorly visualized right transverse and sigmoid sinuses, unchanged. Other major dural venous sinuses are patent. Anatomic variants: Left A1 fenestration. Review of the MIP images confirms the above findings CT Brain Perfusion Findings: CBF (<30%)  Volume: 0 mL Perfusion (Tmax>6.0s) volume: 45 mL Mismatch Volume: 45 mL Infarction Location: No acute core infarct is identified by automated processing. The region of delayed perfusion corresponds to the subacute right PCA infarct. IMPRESSION: CT HEAD: 1. Known acute left basal ganglia infarct and multiple subacute posterior circulation infarcts as shown on yesterday's MRI. 2. No definite new infarct or intracranial hemorrhage. 3.  Chronic ischemia with multiple old infarcts as above. CTA HEAD AND NECK: 1. No emergent large vessel occlusion. 2. Unchanged right PCA occlusion. 3. Advanced intracranial atherosclerosis including severe proximal left PCA and left A3 stenoses which appear increased from the prior CTA. 4. Widely patent cervical carotid arteries. 5. Thyroid nodules measuring up to 1.5 cm. Recommend thyroid US (ref: J Am Coll Radiol. 2015 Feb;12(2): 143-50). CT PERFUSION: No evidence of acute core infarct or penumbra identified by automated processing. Electronically Signed   By: Logan Bores M.D.   On: 09/16/2020 10:32

## 2020-09-16 NOTE — Progress Notes (Signed)
Patient arrived to echo/vascular lab for carotid artery duplex and limited echocardiogram with bubbles. Upon arrival, patient affect was normal, pleasant conversation. Within a few minutes of arrival to the dept, patient sat up quickly in bed and was distressed. I asked what was wrong, to which he responded, "I don't know." I then noticed the patient was holding his right arm. Patient stated that his arm was numb and weak. I immediately called Dr. Erlinda Hong (neurology) while remaining at the bedside, and discussed the ongoing event with him. I was instructed by Dr. Erlinda Hong to return the patient to his room, and notify the patient's nurse to await new orders.   Patient was barely able to raise his right arm to chest level, while left arm was able to go above his head. Right grip strength significantly less than left.   Patient returned to [id number], and patient event discussed with nurse, Juliann Pulse.   Unable to perform carotid artery duplex and limited echocardiogram with bubbles. Will attempt again as patient condition and schedule permits.  09/16/2020 9:22 AM Kelby Aline., MHA, RVT, RDCS, RDMS

## 2020-09-16 NOTE — Plan of Care (Signed)
Pt alert oriented x 4. Pt uses urinal. Pt has right sided weakness, pt states right side feels heavy but is able to move it against resistance.  SCDs placed, pt asked for them to be removed for a while, education provided.   Problem: Education: Goal: Knowledge of disease or condition will improve Outcome: Progressing Goal: Knowledge of secondary prevention will improve Outcome: Progressing Goal: Knowledge of patient specific risk factors addressed and post discharge goals established will improve Outcome: Progressing

## 2020-09-16 NOTE — Progress Notes (Signed)
Inpatient Rehab Admissions:  Inpatient Rehab Consult received.  I met with patient and wife, Craig Knight at the bedside for rehabilitation assessment and to discuss goals and expectations of an inpatient rehab admission.  Both acknowledged understanding and are interested in pt pursuing CIR.  Will continue to follow.     Signed: Gayland Curry, [place], [place] Admissions Coordinator [phone]

## 2020-09-16 NOTE — Progress Notes (Signed)
STROKE TEAM PROGRESS NOTE   INTERVAL HISTORY His wife and daughter are at the bedside.  Pt had worsening weakness on the right arm and leg this am. Had stat CTA head and neck no LVO, CTP no acute perfusion deficit. Pt condition likely capsular warning syndrome. Will give IVF and permissive hypertension.   OBJECTIVE Vitals:   09/16/20 0309 09/16/20 0539 09/16/20 0741 09/16/20 0826  BP: 139/88 (!) 143/55  (!) 132/95  Pulse: 72 72  70  Resp: '17 18  18  '$ Temp: 97.7 F (36.5 C) 98.5 F (36.9 C)  98.2 F (36.8 C)  TempSrc: Oral Oral  Oral  SpO2: 100% 96% 98% 99%  Weight:      Height:        CBC:  Recent Labs  Lab 09/13/20 1949 09/15/20 1348  WBC 14.4* 12.8*  NEUTROABS 11.5* 10.6*  HGB 14.0 14.9  HCT 42.7 43.9  MCV 81.0 80.6  PLT 258 [id number]    Basic Metabolic Panel:  Recent Labs  Lab 09/13/20 1949 09/15/20 1348  NA 136 137  K 3.5 3.5  CL 104 106  CO2 20* 20*  GLUCOSE 98 88  BUN 31* 33*  CREATININE 2.35* 2.01*  CALCIUM 9.4 9.7    Lipid Panel:     Component Value Date/Time   CHOL 115 09/16/2020 0406   TRIG 77 09/16/2020 0406   HDL 33 (L) 09/16/2020 0406   CHOLHDL 3.5 09/16/2020 0406   VLDL 15 09/16/2020 0406   LDLCALC 67 09/16/2020 0406   HgbA1c:  Lab Results  Component Value Date   HGBA1C 7.6 (H) 09/07/2020   Urine Drug Screen:     Component Value Date/Time   LABOPIA NONE DETECTED 09/07/2020 1803   COCAINSCRNUR NONE DETECTED 09/07/2020 1803   LABBENZ NONE DETECTED 09/07/2020 1803   AMPHETMU NONE DETECTED 09/07/2020 1803   THCU NONE DETECTED 09/07/2020 1803   LABBARB NONE DETECTED 09/07/2020 1803    Alcohol Level     Component Value Date/Time   ETH <10 09/06/2020 1630    IMAGING   MR ANGIO HEAD WO CONTRAST  Result Date: 09/15/2020 CLINICAL DATA:  Stroke EXAM: MRA HEAD WITHOUT CONTRAST TECHNIQUE: Angiographic images of the Circle of Willis were acquired using MRA technique without intravenous contrast. COMPARISON:  MRI head 09/15/2020.  CT a  09/06/2020 FINDINGS: Anterior circulation: Motion degraded study. This significantly limits evaluation for small vessel disease. Internal carotid artery patent bilaterally without significant stenosis. Irregular areas of stenosis in the anterior and middle cerebral artery branches bilaterally consistent with widespread atherosclerotic disease. This is confirmed on the prior CTA. No large vessel occlusion. Posterior circulation: Both vertebral arteries are patent to the basilar. Basilar patent. Superior cerebellar arteries patent bilaterally. Occlusion of the proximal right posterior cerebral artery corresponding to right PCA infarct on MRI. There is severe stenosis of the left posterior cerebral artery which has distal flow. This appears progressive from the prior CT angiogram. Differences could be due to artifact from motion. Anatomic variants: None Other: None IMPRESSION: Motion degraded study Diffuse intracranial atherosclerotic disease, moderate to severe Occlusion right PCA. Severe stenosis left PCA with distal flow. This may have progressed since prior studies however is difficult to compare given the amount of motion on the MRA. Electronically Signed   By: Franchot Gallo M.D.   On: 09/15/2020 16:32   MR BRAIN WO CONTRAST  Result Date: 09/15/2020 CLINICAL DATA:  Transient ischemic attack (TIA). Right leg weakness. EXAM: MRI HEAD WITHOUT CONTRAST TECHNIQUE: Multiplanar, multiecho pulse  sequences of the brain and surrounding structures were obtained without intravenous contrast. COMPARISON:  Head CT 09/13/2020 and MRI 09/07/2020 FINDINGS: Brain: There is a new acute left lateral lenticulostriate territory infarct extending from the posterior aspect of the left lentiform nucleus to the caudate body and involving the intervening deep white matter. There is residual diffusion weighted signal abnormality at the sites of the posterior circulation infarcts on the prior MRI (right temporal and occipital lobes,  splenium of the corpus callosum, and left pons) without evidence of significant interval infarct extension. There is minimal petechial hemorrhage in the right occipital lobe. A chronic microhemorrhage is noted in the anterior right frontal lobe. Chronic lacunar infarcts are again noted involving the deep cerebral white matter bilaterally, basal ganglia, and thalami, and there is a chronic moderate-sized left MCA infarct near the anterior aspect of the left frontal operculum. T2 hyperintensities elsewhere in the cerebral white matter bilaterally are unchanged and nonspecific but compatible with moderate chronic small vessel ischemic disease. No mass, midline shift, or extra-axial fluid collection is identified. There is mild cerebral atrophy. Vascular: Major intracranial vascular flow voids are preserved. Skull and upper cervical spine: Unremarkable bone marrow signal. Sinuses/Orbits: Unremarkable orbits. Minimal mucosal thickening or small mucous retention cyst in left sphenoid sinus. Small right and moderate left mastoid effusions. Other: None. IMPRESSION: 1. Acute left basal ganglia infarct. 2. Evolving subacute posterior circulation infarcts without extension from the 09/07/2020 MRI. 3. 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 Electronically Signed   By: Logan Bores M.D.   On: 09/15/2020 14:51    CT ANGIO HEAD NECK W WO CM W PERF  Result Date: 09/16/2020 CLINICAL DATA:  Stroke follow-up.  Right arm numbness. EXAM: CT ANGIOGRAPHY HEAD AND NECK CT PERFUSION BRAIN TECHNIQUE: Multidetector CT imaging of the head and neck was performed using the standard protocol during bolus administration of intravenous contrast. Multiplanar CT image reconstructions and MIPs were obtained to evaluate the vascular anatomy. Carotid stenosis measurements (when applicable) are obtained utilizing NASCET criteria, using the distal internal carotid diameter as the denominator. Multiphase CT imaging  of the brain was performed following IV bolus contrast injection. Subsequent parametric perfusion maps were calculated using RAPID software. CONTRAST:  162m OMNIPAQUE IOHEXOL 350 MG/ML SOLN COMPARISON:  Head MRI and MRA 09/15/2020. Head and neck CTA 09/06/2020. FINDINGS: CT HEAD FINDINGS Brain: Hypodensity in the posterior left basal ganglia/posterior limb of internal capsule/corona radiata corresponds to the acute infarct which is better demonstrated on yesterday's MRI. No acute cortically based infarct, mass, midline shift, or extra-axial fluid collection is identified. Multiple subacute posterior circulation infarcts are again noted involving the posteromedial right temporal and occipital lobes, splenium of the corpus callosum, and left pons with minimal petechial hemorrhage again noted in the right occipital lobe. There is a chronic moderate-sized left frontal lobe MCA infarct, and there are moderate chronic small-vessel changes in the cerebral white matter with chronic lacunar infarcts in the deep cerebral white matter bilaterally, basal ganglia, and thalami. There is mild cerebral atrophy. Vascular: No hyperdense vessel. Skull: No fracture or suspicious osseous lesion. Sinuses/Orbits: Small mucous retention cyst in the left sphenoid sinus. Small to moderate mastoid effusions. Unremarkable orbits. Other: None. CTA NECK FINDINGS Aortic arch: Normal variant aortic arch branching pattern with common origin of the brachiocephalic and left common carotid arteries. Widely patent arch vessel origins. Right carotid system: Patent without evidence of stenosis, dissection, or significant atherosclerosis. Tortuous cervical ICA. Left carotid system: Patent with a small  amount of calcified plaque at the carotid bifurcation. No evidence of a significant stenosis or dissection. Tortuous cervical ICA. Vertebral arteries: Patent and codominant without evidence of a significant stenosis or dissection. Skeleton: Mild cervical  spondylosis. Other neck: Bilateral thyroid nodules measuring up to 1.5 cm. No evidence of cervical lymphadenopathy. Upper chest: Clear lung apices. Review of the MIP images confirms the above findings CTA HEAD FINDINGS Anterior circulation: The internal carotid arteries are patent from skull base to carotid termini with mild atherosclerosis not resulting in a significant stenosis. ACAs and MCAs are patent without evidence of a proximal branch occlusion. There is widespread atherosclerotic type irregularity including an unchanged mild proximal left M1 stenosis. Severe left A3 stenoses appear increased from the prior CTA. No aneurysm is identified. Posterior circulation: The intracranial vertebral arteries are patent to the basilar with atherosclerotic plaque resulting in an unchanged moderate stenosis on the left. The a severe proximal right AICA stenosis is again seen. Patent SCA is are identified bilaterally. The basilar artery is widely patent. There is a small left posterior communicating artery. There is unchanged occlusion of the right PCA near the P1-P2 junction. The left PCA is patent but diffusely irregular with a severe stenosis in the proximal P2 segment which appears increased from the prior CTA but less extensive than what was suggested on the more recent MRA. No aneurysm is identified. Venous sinuses: Hypoplastic and poorly visualized right transverse and sigmoid sinuses, unchanged. Other major dural venous sinuses are patent. Anatomic variants: Left A1 fenestration. Review of the MIP images confirms the above findings CT Brain Perfusion Findings: CBF (<30%) Volume: 0 mL Perfusion (Tmax>6.0s) volume: 45 mL Mismatch Volume: 45 mL Infarction Location: No acute core infarct is identified by automated processing. The region of delayed perfusion corresponds to the subacute right PCA infarct. IMPRESSION: CT HEAD: 1. Known acute left basal ganglia infarct and multiple subacute posterior circulation infarcts as  shown on yesterday's MRI. 2. No definite new infarct or intracranial hemorrhage. 3. Chronic ischemia with multiple old infarcts as above. CTA HEAD AND NECK: 1. No emergent large vessel occlusion. 2. Unchanged right PCA occlusion. 3. Advanced intracranial atherosclerosis including severe proximal left PCA and left A3 stenoses which appear increased from the prior CTA. 4. Widely patent cervical carotid arteries. 5. Thyroid nodules measuring up to 1.5 cm. Recommend thyroid US (ref: J Am Coll Radiol. 2015 Feb;12(2): 143-50). CT PERFUSION: No evidence of acute core infarct or penumbra identified by automated processing. Electronically Signed   By: Logan Bores M.D.   On: 09/16/2020 10:32    ECG - SR rate 96 BPM. (See cardiology reading for complete details)   PHYSICAL EXAM  Temp:  [97.7 F (36.5 C)-98.5 F (36.9 C)] 97.9 F (36.6 C) (07/23 1218) Pulse Rate:  [69-109] 96 (07/23 1218) Resp:  [14-23] 16 (07/23 1218) BP: (111-174)/(55-102) 133/95 (07/23 1218) SpO2:  [93 %-100 %] 97 % (07/23 1218)  General - Well nourished, well developed, in no apparent distress.  Ophthalmologic - fundi not visualized due to noncooperation.  Cardiovascular - Regular rhythm and rate.  Mental Status -  Level of arousal and orientation to time, place, and person were intact. Language including expression, naming, repetition, comprehension was assessed and found intact.  Cranial Nerves II - XII - II - left hemianopia III, IV, VI - Extraocular movements intact. V - Facial sensation intact bilaterally. VII - right facial droop VIII - Hearing & vestibular intact bilaterally. X - Palate elevates symmetrically. XI - Chin turning &  shoulder shrug intact bilaterally. XII - Tongue protrusion intact.  Motor Strength - The patient's strength was normal in left upper and lower extremities, however, right UE proximal 2/5, bicep 3/5, tricep 2/5, finger grip 2+/5, RLE proximal 2/5, knee extension 3/5, toe movement 3/5.   Bulk was normal and fasciculations were absent.   Motor Tone - Muscle tone was assessed at the neck and appendages and was normal.  Reflexes - The patient's reflexes were symmetrical in all extremities and he had no pathological reflexes.  Sensory - Light touch, temperature/pinprick were assessed and were symmetrical.    Coordination - The patient had normal movements in left hand with no ataxia or dysmetria.  Tremor was absent.  Gait and Station - deferred.     ASSESSMENT/PLAN Craig Knight is a 55 y.o. male with history of DM2, COPD, ongoing tobacco use, HTN, OSA, recent large R PCA stroke and left pontine stroke last week with residual L hemianopsia and mild right sided weakness and right facial droop with unclear stroke etiology who presented to [facility] ED with shortness of breath and worsening R sided weakness. He had an MRI Brain which demonstrated new acute left basal ganglia stroke and evolving R PCA stroke. He did not receive IV t-PA due to late presentation (>4.5 hours from time of onset).  Stroke with capsular warning syndrome - acute left CR stroke, source unclear.  CT head - Evolving right PCA infarct without interval hemorrhage. Evolving left pontine infarct without interval hemorrhage. No definite new acute intracranial abnormality since prior study from 09/06/2020. Multiple chronic infarcts involving left frontal lobe, bilateral white matter, basal ganglia and thalamus. MRI head - Acute left CR infarct. Evolving subacute posterior circulation infarcts without extension from the 09/07/2020 MRI. Multiple old infarcts. MRA head - Moderate to severe Occlusion right PCA. Severe stenosis left PCA with distal flow. CTA H&N - unchanged left PCA occlusion, underwent intracranial stenosis with severe proximal left PCA and left A3 stenosis CT Perfusion no new perfusion deficit Recommend TEE and loop recorder for further embolic work-up. LDL - 67 HgbA1c pending UDS -  negative VTE prophylaxis - Lovenox 40 mg daily aspirin 81 mg daily and Brilinta daily prior to admission, now on aspirin 81 mg daily and Brilinta (ticagrelor) 90 mg bid Patient will be counseled to be compliant with his antithrombotic medications Ongoing aggressive stroke risk factor management Therapy recommendations:  CIR Disposition:  Pending  CHF Myoview 2018 showed EF 38% However TTE showed EF normal range Has been following with cardiology, on Imdur Recommend TEE and loop recorder for further work-up    History of stroke 09/06/2020 admitted for right-sided weakness, slurred speech and right facial droop.  Exam found also left hemianopia.  CT showed subacute left PCA infarct.  CTA head and neck right P1/P2 occlusion, bilateral M3, right A3, left VA and left P2 moderate stenosis.  MRI showed right PCA large infarct with left pontine infarct.  EF 60 to 65%.  LDL 108, A1c 7.6.  Etiology concerning for cardioembolic source, recommend outpatient TEE and loop recorder since patient not willing to stay over the weekend.  Put on aspirin 81 and Brilinta for 30 days on discharge and then back to aspirin 81 and Plavix 75 home dose.  Diabetes HgbA1c pending goal < 7.0 Uncontrolled CBG monitoring SSI DM education and close PCP follow up   Hypertension Stable on the high end Permissive hypertension (OK if <220/120) for 24-48 hours post stroke and then gradually normalized within  5-7 days. Avoid low BP On IVF  Close monitoring   Hyperlipidemia Home meds: Lipitor 80 LDL 67 goal < 70 Continue on Lipitor 80 Continue statin at discharge   CKD Creatinine 1.8-2.4 BMP in a.m.   Tobacco abuse Recently quit smoking Smoking cessation counseling again provided Pt is willing to continue to quit  Other Stroke Risk Factors ETOH use, advised to drink no more than 1 alcoholic beverage per day. Obesity, Body mass index is 35.24 kg/m., recommend weight loss, diet and exercise as appropriate   Family hx stroke (mother; brother; and grandmother)  Obstructive sleep apnea  Other Active Problems Leukocytosis - WBC's 14.4->12.8 CKD - stage 3b - Cre - 2.35->2.01   Hospital day # 1  Patient condition worsened within the last 24 hours, has developed worsening right-sided weakness, and I have ordered CT head neck and IV fluid. I discussed with Dr. Candiss Norse. I spent  35 minutes in total face-to-face time with the patient, more than 50% of which was spent in counseling and coordination of care, reviewing test results, images and medication, and discussing the diagnosis, treatment plan and potential prognosis. This patient's care requiresreview of multiple databases, neurological assessment, discussion with family, other specialists and medical decision making of high complexity. I had long discussion with wife and patient at bedside, updated pt current condition, treatment plan and potential prognosis, and answered all the questions.  They expressed understanding and appreciation.    Rosalin Hawking, MD PhD Stroke Neurology 09/16/2020 5:54 PM    To contact Stroke Continuity provider, please refer to [email or web address] After hours, contact General Neurology

## 2020-09-17 ENCOUNTER — Inpatient Hospital Stay (HOSPITAL_COMMUNITY): Payer: Medicare Other

## 2020-09-17 DIAGNOSIS — J449 Chronic obstructive pulmonary disease, unspecified: Secondary | ICD-10-CM | POA: Diagnosis not present

## 2020-09-17 DIAGNOSIS — I5032 Chronic diastolic (congestive) heart failure: Secondary | ICD-10-CM | POA: Diagnosis not present

## 2020-09-17 DIAGNOSIS — I639 Cerebral infarction, unspecified: Secondary | ICD-10-CM | POA: Diagnosis not present

## 2020-09-17 LAB — CBC WITH DIFFERENTIAL/PLATELET
Abs Immature Granulocytes: 0.04 10*3/uL (ref 0.00–0.07)
Basophils Absolute: 0 10*3/uL (ref 0.0–0.1)
Basophils Relative: 0 %
Eosinophils Absolute: 0.1 10*3/uL (ref 0.0–0.5)
Eosinophils Relative: 1 %
HCT: 44.3 % (ref 39.0–52.0)
Hemoglobin: 14.7 g/dL (ref 13.0–17.0)
Immature Granulocytes: 0 %
Lymphocytes Relative: 19 %
Lymphs Abs: 2.3 10*3/uL (ref 0.7–4.0)
MCH: 26.4 pg (ref 26.0–34.0)
MCHC: 33.2 g/dL (ref 30.0–36.0)
MCV: 79.7 fL — ABNORMAL LOW (ref 80.0–100.0)
Monocytes Absolute: 1.1 10*3/uL — ABNORMAL HIGH (ref 0.1–1.0)
Monocytes Relative: 9 %
Neutro Abs: 8.7 10*3/uL — ABNORMAL HIGH (ref 1.7–7.7)
Neutrophils Relative %: 71 %
Platelets: 243 10*3/uL (ref 150–400)
RBC: 5.56 MIL/uL (ref 4.22–5.81)
RDW: 13.9 % (ref 11.5–15.5)
WBC: 12.3 10*3/uL — ABNORMAL HIGH (ref 4.0–10.5)
nRBC: 0 % (ref 0.0–0.2)

## 2020-09-17 LAB — GLUCOSE, CAPILLARY
Glucose-Capillary: 104 mg/dL — ABNORMAL HIGH (ref 70–99)
Glucose-Capillary: 112 mg/dL — ABNORMAL HIGH (ref 70–99)
Glucose-Capillary: 106 mg/dL — ABNORMAL HIGH (ref 70–99)
Glucose-Capillary: 115 mg/dL — ABNORMAL HIGH (ref 70–99)

## 2020-09-17 LAB — COMPREHENSIVE METABOLIC PANEL
ALT: 19 U/L (ref 0–44)
AST: 36 U/L (ref 15–41)
Albumin: 3.8 g/dL (ref 3.5–5.0)
Alkaline Phosphatase: 78 U/L (ref 38–126)
Anion gap: 10 (ref 5–15)
BUN: 35 mg/dL — ABNORMAL HIGH (ref 6–20)
CO2: 23 mmol/L (ref 22–32)
Calcium: 9.2 mg/dL (ref 8.9–10.3)
Chloride: 104 mmol/L (ref 98–111)
Creatinine, Ser: 2 mg/dL — ABNORMAL HIGH (ref 0.61–1.24)
GFR, Estimated: 38 mL/min — ABNORMAL LOW (ref >60)
Glucose, Bld: 99 mg/dL (ref 70–99)
Potassium: 3.9 mmol/L (ref 3.5–5.1)
Sodium: 137 mmol/L (ref 135–145)
Total Bilirubin: 0.4 mg/dL (ref 0.3–1.2)
Total Protein: 6.8 g/dL (ref 6.5–8.1)

## 2020-09-17 LAB — MAGNESIUM: Magnesium: 2.2 mg/dL (ref 1.7–2.4)

## 2020-09-17 IMAGING — MR MR HEAD W/O CM
8 series · 48 of 48 positions shown · non-contrast
Comparison: Two days ago

CLINICAL DATA: Stroke follow-up

EXAM:
MRI HEAD WITHOUT CONTRAST
TECHNIQUE: Multiplanar, multiecho pulse sequences of the brain and surrounding
structures were obtained without intravenous contrast.

[Series 5: DWI · axial · 3.0mm · 0.88mm/px · z∈[-104,+46]mm · 10 of 103 slices shown (1 of 4)]
[im 1/103]
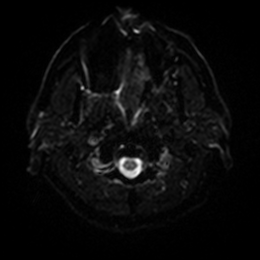
[im 12/103]
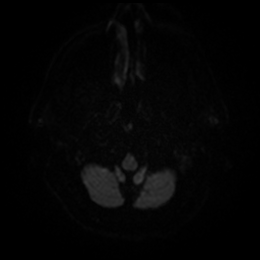
[im 23/103]
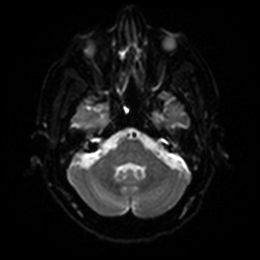
[im 35/103]
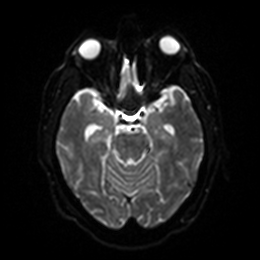
[im 46/103]
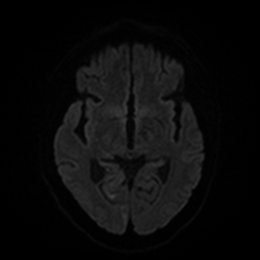
[im 57/103]
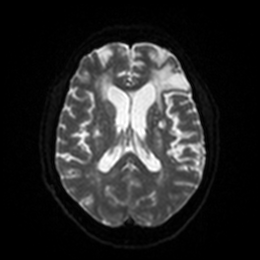
[im 69/103]
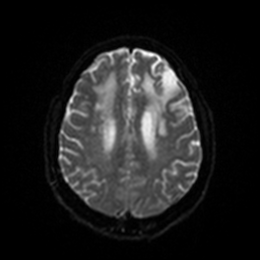
[im 80/103]
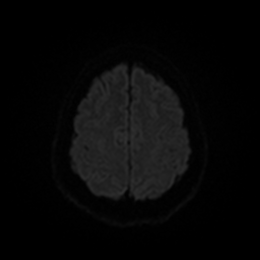
[im 91/103]
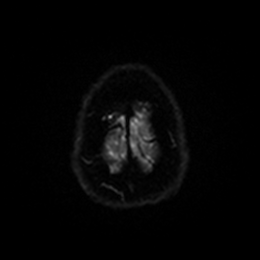
[im 103/103]
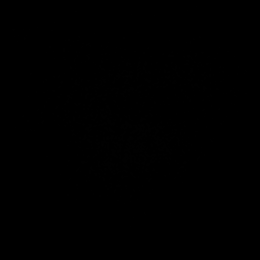

[Series 6: DWI · axial · 3.0mm · 0.88mm/px · z∈[-104,+43]mm · 5 of 51 slices shown (2 of 4)]
[im 1/51]
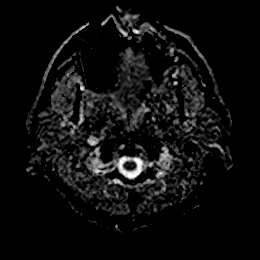
[im 13/51]
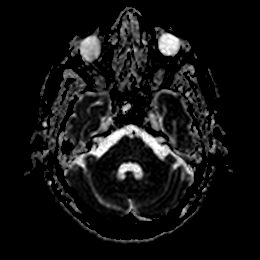
[im 26/51]
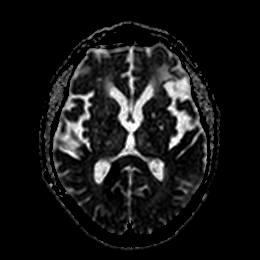
[im 38/51]
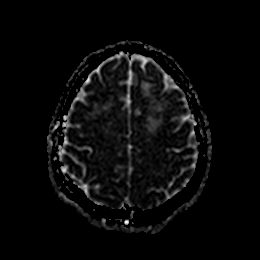
[im 51/51]
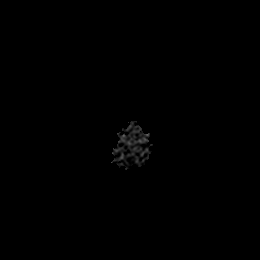

[Series 7: DWI · coronal · 4.0mm · 0.88mm/px · 7 of 76 slices shown (3 of 4)]
[im 1/76]
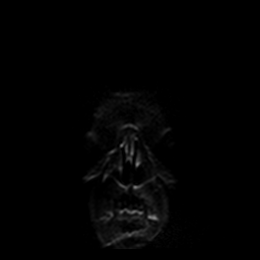
[im 13/76]
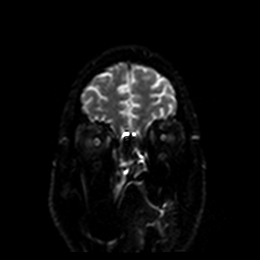
[im 26/76]
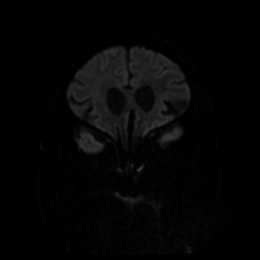
[im 38/76]
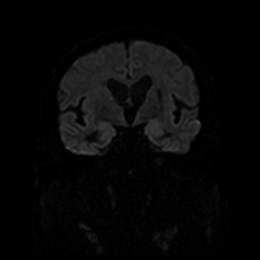
[im 51/76]
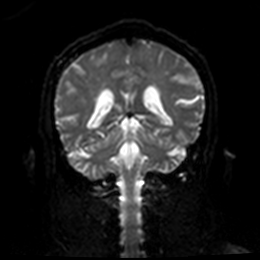
[im 63/76]
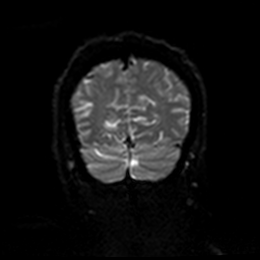
[im 76/76]
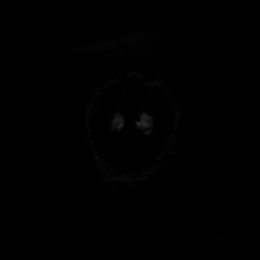

[Series 8: DWI · coronal · 4.0mm · 0.88mm/px · 4 of 38 slices shown (4 of 4)]
[im 1/38]
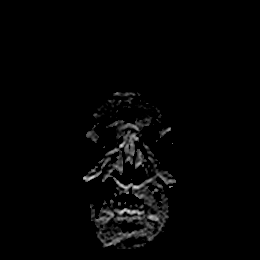
[im 13/38]
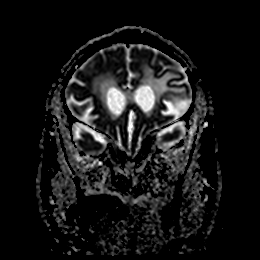
[im 25/38]
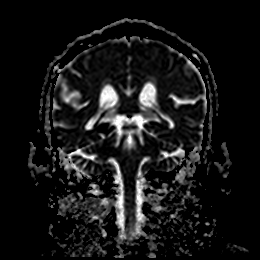
[im 38/38]
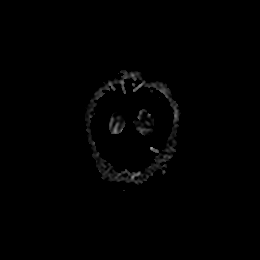

[Series 9: mag_images · axial · 3.0mm · 0.90mm/px · z∈[-121,+53]mm · 6 of 60 slices shown]
[im 1/60]
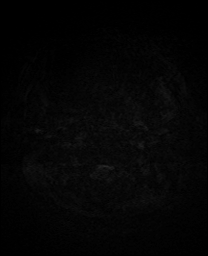
[im 12/60]
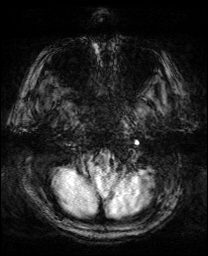
[im 24/60]
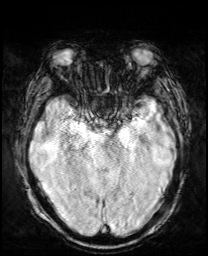
[im 36/60]
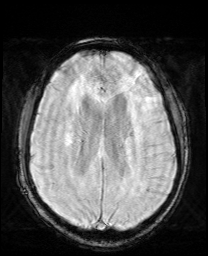
[im 48/60]
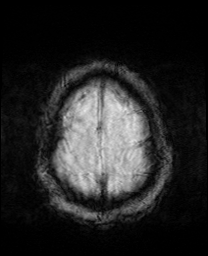
[im 60/60]
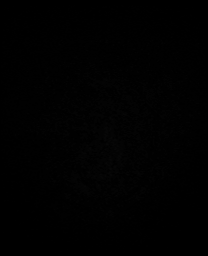

[Series 10: pha_images · axial · 3.0mm · 0.90mm/px · z∈[-121,+44]mm · 5 of 56 slices shown]
[im 1/56]
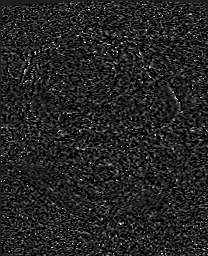
[im 14/56]
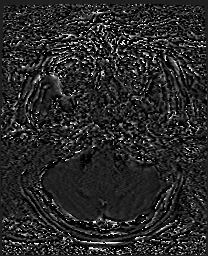
[im 28/56]
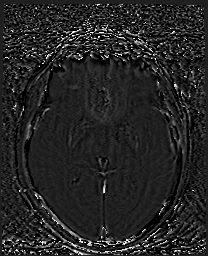
[im 42/56]
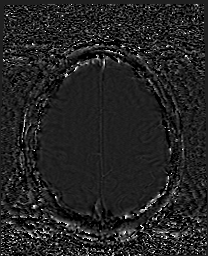
[im 56/56]
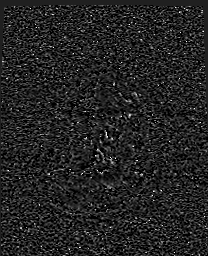

[Series 11: swi_images · axial · 3.0mm · 0.90mm/px · z∈[-121,+53]mm · 6 of 60 slices shown]
[im 1/60]
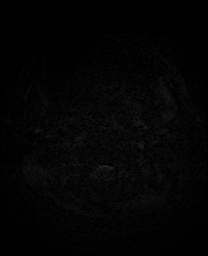
[im 12/60]
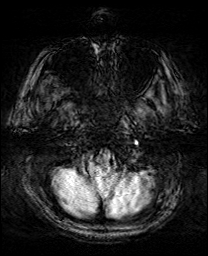
[im 24/60]
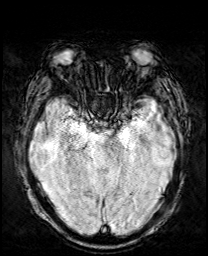
[im 36/60]
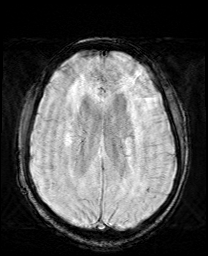
[im 48/60]
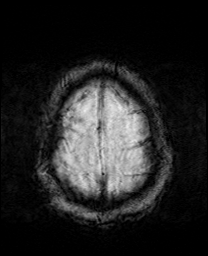
[im 60/60]
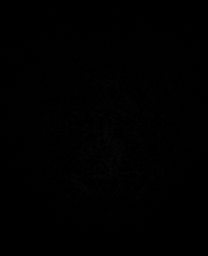

[Series 12: mip_images(sw) · axial · 24.0mm · 0.90mm/px · z∈[-110,+42]mm · 5 of 53 slices shown]
[im 1/53]
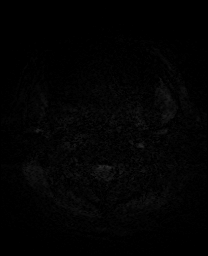
[im 14/53]
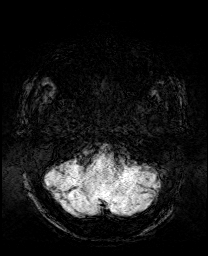
[im 27/53]
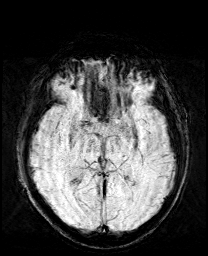
[im 40/53]
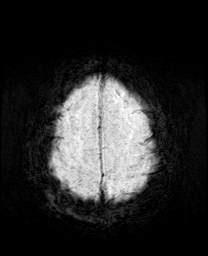
[im 53/53]
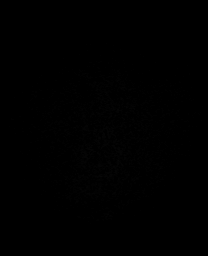

[48 of 48 positions shown; findings below may reference images not displayed]

FINDINGS: Brain: Acute left perforator infarct affecting the corona radiata
and caudate body. More weakly restricted diffusion in the right PCA
territory at the occipital lobe and right posterior corpus callosum.
Subacute perforator infarct at the left pons. No interval infarction
is seen.

No hemorrhagic complication.

Remote left frontal operculum infarct. Chronic small vessel ischemia
and lacunar infarcts.

Vascular: No available T2 weighted imaging

Skull and upper cervical spine: Negative

Sinuses/Orbits: Negative

Other: Progressively motion degraded. Truncated study per clinical
request.
IMPRESSION: Unchanged acute and subacute infarcts involving the infra and
supratentorial brain.

## 2020-09-17 MED ORDER — LORAZEPAM 2 MG/ML IJ SOLN
0.5000 mg | Freq: Once | INTRAMUSCULAR | Status: AC | PRN
  Administered 2020-09-17: 0.5 mg via INTRAVENOUS
  Filled 2020-09-17: qty 1

## 2020-09-17 MED ORDER — LORAZEPAM 0.5 MG PO TABS
0.5000 mg | ORAL_TABLET | Freq: Once | ORAL | Status: DC | PRN
  Filled 2020-09-17: qty 1

## 2020-09-17 NOTE — Progress Notes (Signed)
PROGRESS NOTE                                                                                                                                                                                                             Patient Demographics:    Craig Knight, is a 55 y.o. male, DOB - [birth year], JW:[id number]  Outpatient Primary MD for the patient is Craig Fire, MD    LOS - 2  Admit date - 09/15/2020    Chief Complaint  Patient presents with   Shortness of Breath       Brief Narrative (HPI from H&P)  Craig Knight  is a 55 y.o. male, HTN, DM-2, diastolic CHF, [id number], COPD ,large R PCA stroke and left pontine stroke last week with residual L hemianopsia and mild right sided weakness and right facial droop, he was already on aspirin Plavix and statin and presented to [facility] with worsening right-sided weakness and facial droop, also mild COPD exacerbation.  He was found to have a new stroke and was transferred to [facility] for further treatment.   Subjective:   Patient in bed, appears comfortable, denies any headache, no fever, no chest pain or pressure, no shortness of breath , no abdominal pain. +ve R sided weakness and facial droop.   Assessment  & Plan :     Acute left basal ganglia infarct. 2. Evolving subacute posterior circulation infarcts without extension from the 09/07/2020 MRI. 3. Moderate chronic small vessel ischemic disease with multiple old infarcts - in in a patient with recent right PCA occlusion and stroke who was already on aspirin Plavix and statin.  He is now on aspirin Brilinta and statin, neurology following, unfortunately not a candidate for tPA or interventional neurology intervention.  IV fluid added for 12 hours to push his blood pressure higher in hopes of some help on 09/16/20.  Case discussed with stroke team Dr. Erlinda Knight, continue supportive care and monitor. Explained the situation to  wife and patient in detail, strictly counseled to quit smoking.   2. Incidental Thyroid nodules measuring up to 1.5 cm. - outpt PCP directed Thyroid US  3.  Ongoing smoking.  Strictly counseled to quit.  He has been abstaining for the last 3 weeks.  4.  COPD.  Stable supportive care.  5.  Hypertension.  Permissive hypertension due to stroke.  6.  AKI on CKD stage IIIa.  Baseline creatinine around 2, improving with hydration.  7.  OSA - CPAP QHS  8. DM type II.  On sliding scale  Lab Results  Component Value Date   HGBA1C 7.6 (H) 09/07/2020   .Marland KitchenCBG (last 3)  Recent Labs    09/16/20 2117 09/17/20 0620 09/17/20 1122  GLUCAP 120* 104* 112*    Lab Results  Component Value Date   CHOL 115 09/16/2020   HDL 33 (L) 09/16/2020   LDLCALC 67 09/16/2020   TRIG 77 09/16/2020   CHOLHDL 3.5 09/16/2020         Condition - Extremely Guarded  Family Communication  :  Wife Craig Knight on 09/16/20  Code Status :  Full  Consults  :  Neuro  PUD Prophylaxis :    Procedures  :     MRI - 1. Acute left basal ganglia infarct. 2. Evolving subacute posterior circulation infarcts without extension from the 09/07/2020 MRI. 3. Moderate chronic small vessel ischemic disease with multiple old infarcts  CTA - 1. No emergent large vessel occlusion. 2. Unchanged right PCA occlusion. 3. Advanced intracranial atherosclerosis including severe proximal left PCA and left A3 stenoses which appear increased from the prior CTA. 4. Widely patent cervical carotid arteries. 5. Thyroid nodules measuring up to 1.5 cm. Recommend thyroid US (ref: J Am Coll Radiol. 2015 Feb;12(2): 143-50). CT PERFUSION: No evidence of acute core infarct or penumbra identified by automated processing.  TTE 2 weeks ago - 1. Left ventricular ejection fraction, by estimation, is 60 to 65%. The left ventricle has normal function. The left ventricle has no regional wall motion abnormalities. There is severe left ventricular hypertrophy. Left  ventricular diastolic parameters  are consistent with Grade I diastolic dysfunction (impaired relaxation).  2. Right ventricular systolic function is normal. The right ventricular size is normal. Tricuspid regurgitation signal is inadequate for assessing PA pressure.  3. The mitral valve is normal in structure. No evidence of mitral valve regurgitation. No evidence of mitral stenosis.  4. The aortic valve has an indeterminant number of cusps. Aortic valve regurgitation is not visualized. No aortic stenosis is present.      Disposition Plan  :    Status is: Inpatient  Remains inpatient appropriate because:IV treatments appropriate due to intensity of illness or inability to take PO  Dispo: The patient is from: Home              Anticipated d/c is to: Home              Patient currently is not medically stable to d/c.   Difficult to place patient No  DVT Prophylaxis  :    enoxaparin (LOVENOX) injection 40 mg Start: 09/16/20 1000 SCD's Start: 09/15/20 2239    Lab Results  Component Value Date   PLT 243 09/17/2020    Diet :  Diet Order             Diet NPO time specified Except for: Sips with Meds  Diet effective ____           Diet Heart Room service appropriate? Yes; Fluid consistency: Thin  Diet effective now  Inpatient Medications  Scheduled Meds:  aspirin EC  81 mg Oral Daily   atorvastatin  80 mg Oral Daily   enoxaparin (LOVENOX) injection  40 mg Subcutaneous Q24H   gabapentin  300 mg Oral QHS   insulin aspart  0-5 Units Subcutaneous QHS   insulin aspart  0-9 Units Subcutaneous TID WC   ticagrelor  90 mg Oral BID   traZODone  100 mg Oral QHS   umeclidinium-vilanterol  1 puff Inhalation Daily   Continuous Infusions:  sodium chloride 75 mL/hr at 09/16/20 1405   PRN Meds:.acetaminophen **OR** acetaminophen (TYLENOL) oral liquid 160 mg/5 mL **OR** acetaminophen, ipratropium-albuterol  Antibiotics  :    Anti-infectives (From admission,  onward)    None        Time Spent in minutes  30   Craig Knight M.D on 09/17/2020 at 11:29 AM  To page go to [email or web address]   [facility] -  Office  [phone]   See all Orders from today for further details    Objective:   Vitals:   09/17/20 0425 09/17/20 0738 09/17/20 0749 09/17/20 1120  BP: (!) 140/96 (!) 147/93  (!) 160/97  Pulse: (!) 56 (!) 57  70  Resp: '18 16  20  '$ Temp: 98.2 F (36.8 C) 97.7 F (36.5 C)  98.5 F (36.9 C)  TempSrc: Oral Oral  Oral  SpO2: 98% 96% 96% 97%  Weight:      Height:        Wt Readings from Last 3 Encounters:  09/15/20 102.1 kg  09/13/20 102.1 kg  09/05/20 104.3 kg     Intake/Output Summary (Last 24 hours) at 09/17/2020 1129 Last data filed at 09/17/2020 0322 Gross per 24 hour  Intake 996.25 ml  Output 200 ml  Net 796.25 ml     Physical Exam  Awake Alert, No new F.N deficits, R side 3/5, Leg 3/5 [place].AT,PERRAL Supple Neck,No JVD, No cervical lymphadenopathy appriciated.  Symmetrical Chest wall movement, Good air movement bilaterally, CTAB RRR,No Gallops, Rubs or new Murmurs, No Parasternal Heave +ve B.Sounds, Abd Soft, No tenderness, No organomegaly appriciated, No rebound - guarding or rigidity. No Cyanosis, Clubbing or edema, No new Rash or bruise       Data Review:    CBC Recent Labs  Lab 09/13/20 1949 09/15/20 1348 09/17/20 0211  WBC 14.4* 12.8* 12.3*  HGB 14.0 14.9 14.7  HCT 42.7 43.9 44.3  PLT 258 284 243  MCV 81.0 80.6 79.7*  MCH 26.6 27.3 26.4  MCHC 32.8 33.9 33.2  RDW 14.2 14.2 13.9  LYMPHSABS 1.7 1.1 2.3  MONOABS 1.0 1.0 1.1*  EOSABS 0.1 0.1 0.1  BASOSABS 0.1 0.0 0.0    Recent Labs  Lab 09/13/20 1949 09/15/20 1348 09/17/20 0211  NA 136 137 137  K 3.5 3.5 3.9  CL 104 106 104  CO2 20* 20* 23  GLUCOSE 98 88 99  BUN 31* 33* 35*  CREATININE 2.35* 2.01* 2.00*  CALCIUM 9.4 9.7 9.2  AST  --  20 36  ALT  --  19 19  ALKPHOS  --  87 78  BILITOT  --  0.9 0.4  ALBUMIN  --  4.4 3.8   MG  --   --  2.2  INR  --  1.0  --     ------------------------------------------------------------------------------------------------------------------ Recent Labs    09/16/20 0406  CHOL 115  HDL 33*  LDLCALC 67  TRIG 77  CHOLHDL 3.5    Lab Results  Component Value  Date   HGBA1C 7.6 (H) 09/07/2020   ------------------------------------------------------------------------------------------------------------------ No results for input(s): TSH, T4TOTAL, T3FREE, THYROIDAB in the last 72 hours.  Invalid input(s): FREET3  Cardiac Enzymes No results for input(s): CKMB, TROPONINI, MYOGLOBIN in the last 168 hours.  Invalid input(s): CK ------------------------------------------------------------------------------------------------------------------    Component Value Date/Time   BNP 48.0 06/07/2015 0115    Radiology Reports CT Angio Head W or Wo Contrast  Result Date: 09/06/2020 CLINICAL DATA:  Right-sided weakness, left-sided vision impairment. EXAM: CT ANGIOGRAPHY HEAD AND NECK TECHNIQUE: Multidetector CT imaging of the head and neck was performed using the standard protocol during bolus administration of intravenous contrast. Multiplanar CT image reconstructions and MIPs were obtained to evaluate the vascular anatomy. Carotid stenosis measurements (when applicable) are obtained utilizing NASCET criteria, using the distal internal carotid diameter as the denominator. CONTRAST:  54m OMNIPAQUE IOHEXOL 350 MG/ML SOLN COMPARISON:  CT angiogram of the head and neck November 23, 2018. FINDINGS: CTA NECK FINDINGS Aortic arch: Common origin of the innominate and left common carotid artery from the aortic arch. Imaged portion shows no evidence of aneurysm or dissection. No significant stenosis of the major arch vessel origins. Right carotid system: Increased tortuosity of the cervical segment of the right ICA. No significant stenosis. Left carotid system: Mild atherosclerotic changes of  the left carotid bifurcation without hemodynamically significant stenosis. Increased tortuosity of the cervical segment of the left ICA. Vertebral arteries: Codominant. No evidence of dissection, stenosis (50% or greater) or occlusion. Skeleton: No acute osseous abnormality. Periapical lucency at the right maxillary first molar tooth. Other neck: Enlarged thyroid gland with multiple hypodense nodules, the largest measuring up to 1.5 cm. Upper chest: Negative. Review of the MIP images confirms the above findings CTA HEAD FINDINGS Anterior circulation: Mild atherosclerotic changes of the bilateral carotid siphon without stenosis. Luminal irregularity seen along the bilateral MCA vascular trees with focus of moderate stenosis at a left M3/MCA superior division branch and mild stenosis at a right M3/MCA inferior division branch. There is also luminal irregularity in the bilateral ACA vascular trees with focal area of moderate stenosis at the right A3/ACA segment. Fenestration of the left A1/ACA again noted. Posterior circulation: Atherosclerotic changes of the bilateral intracranial vertebral arteries with moderate stenosis on the left. No significant stenosis on the right. The basilar artery has normal course and caliber. There is an occlusion of the right PCA at the P1-P2 junction with poor distal flow. Atherosclerotic changes of the left posterior cerebral artery with mild-to-moderate stenosis at the P2 segment. Venous sinuses: As permitted by contrast timing, patent. Anatomic variants: Fenestration of the left A1/ACA. Review of the MIP images confirms the above findings IMPRESSION: 1. Positive for large vessel occlusion at the right P1-P2/PCA junction. 2. Intracranial atherosclerotic disease with moderate stenosis at a left M3/MCA superior division branch, mild stenosis at a right M3/MCA inferior division branch, moderate stenosis at the right A3/ACA segment, moderate stenosis at the left vertebral artery and  mild-to-moderate stenosis at a left P2/PCA. 3. No significant atherosclerotic disease in the neck. These results were called by telephone at the time of interpretation on 09/06/2020 at 5:19 pm to Dr. XErlinda Knight who verbally acknowledged these results. Electronically Signed   By: KPedro EarlsM.D.   On: 09/06/2020 17:42   DG Chest 2 View  Result Date: 09/13/2020 CLINICAL DATA:  Shortness of breath.  Right-sided weakness. EXAM: CHEST - 2 VIEW COMPARISON:  Radiograph 09/29/2018, chest CT 06/27/2020 FINDINGS: Lung volumes are low. Mild cardiomegaly.  Bandlike scarring at the left lung base. No acute or confluent airspace disease. No pulmonary edema. No pleural effusion or pneumothorax. No acute osseous abnormalities are seen. IMPRESSION: Low lung volumes with mild cardiomegaly and left basilar scarring. Electronically Signed   By: Keith Rake M.D.   On: 09/13/2020 19:57   CT Head Wo Contrast  Result Date: 09/13/2020 CLINICAL DATA:  Right leg weakness EXAM: CT HEAD WITHOUT CONTRAST TECHNIQUE: Contiguous axial images were obtained from the base of the skull through the vertex without intravenous contrast. COMPARISON:  MRI 09/07/2020, CT 09/06/2020 FINDINGS: Brain: Mild residual hypodensity within the right occipital and posterior temporal lobes as well as splenium corpus callosum consistent with evolving right PCA infarct. No definitive hemorrhage seen. Hypodensity in the left pons, also consistent with evolving infarct. Negative for intracranial mass. Chronic left frontal lobe infarct. Chronic bilateral white matter and basal ganglial infarcts. Chronic lacunar infarcts in the thalamus. Stable ventricle size. Chronic small vessel ischemic changes of the white matter. Vascular: No hyperdense vessels. Mild carotid vascular calcification. Vertebral artery calcification Skull: Normal. Negative for fracture or focal lesion. Sinuses/Orbits: Mucosal thickening in the sinuses Other: None IMPRESSION: 1.  Evolving right PCA infarct without interval hemorrhage. Evolving left pontine infarct without interval hemorrhage. No definite new acute intracranial abnormality since prior study from 09/06/2020. 2. Atrophy with chronic small vessel ischemic changes of the white matter. Multiple chronic infarcts involving left frontal lobe, bilateral white matter, basal ganglia and thalamus. Electronically Signed   By: Donavan Foil M.D.   On: 09/13/2020 20:24   CT Angio Neck W and/or Wo Contrast  Result Date: 09/06/2020 CLINICAL DATA:  Right-sided weakness, left-sided vision impairment. EXAM: CT ANGIOGRAPHY HEAD AND NECK TECHNIQUE: Multidetector CT imaging of the head and neck was performed using the standard protocol during bolus administration of intravenous contrast. Multiplanar CT image reconstructions and MIPs were obtained to evaluate the vascular anatomy. Carotid stenosis measurements (when applicable) are obtained utilizing NASCET criteria, using the distal internal carotid diameter as the denominator. CONTRAST:  56m OMNIPAQUE IOHEXOL 350 MG/ML SOLN COMPARISON:  CT angiogram of the head and neck November 23, 2018. FINDINGS: CTA NECK FINDINGS Aortic arch: Common origin of the innominate and left common carotid artery from the aortic arch. Imaged portion shows no evidence of aneurysm or dissection. No significant stenosis of the major arch vessel origins. Right carotid system: Increased tortuosity of the cervical segment of the right ICA. No significant stenosis. Left carotid system: Mild atherosclerotic changes of the left carotid bifurcation without hemodynamically significant stenosis. Increased tortuosity of the cervical segment of the left ICA. Vertebral arteries: Codominant. No evidence of dissection, stenosis (50% or greater) or occlusion. Skeleton: No acute osseous abnormality. Periapical lucency at the right maxillary first molar tooth. Other neck: Enlarged thyroid gland with multiple hypodense nodules, the  largest measuring up to 1.5 cm. Upper chest: Negative. Review of the MIP images confirms the above findings CTA HEAD FINDINGS Anterior circulation: Mild atherosclerotic changes of the bilateral carotid siphon without stenosis. Luminal irregularity seen along the bilateral MCA vascular trees with focus of moderate stenosis at a left M3/MCA superior division branch and mild stenosis at a right M3/MCA inferior division branch. There is also luminal irregularity in the bilateral ACA vascular trees with focal area of moderate stenosis at the right A3/ACA segment. Fenestration of the left A1/ACA again noted. Posterior circulation: Atherosclerotic changes of the bilateral intracranial vertebral arteries with moderate stenosis on the left. No significant stenosis on the right. The basilar  artery has normal course and caliber. There is an occlusion of the right PCA at the P1-P2 junction with poor distal flow. Atherosclerotic changes of the left posterior cerebral artery with mild-to-moderate stenosis at the P2 segment. Venous sinuses: As permitted by contrast timing, patent. Anatomic variants: Fenestration of the left A1/ACA. Review of the MIP images confirms the above findings IMPRESSION: 1. Positive for large vessel occlusion at the right P1-P2/PCA junction. 2. Intracranial atherosclerotic disease with moderate stenosis at a left M3/MCA superior division branch, mild stenosis at a right M3/MCA inferior division branch, moderate stenosis at the right A3/ACA segment, moderate stenosis at the left vertebral artery and mild-to-moderate stenosis at a left P2/PCA. 3. No significant atherosclerotic disease in the neck. These results were called by telephone at the time of interpretation on 09/06/2020 at 5:19 pm to Dr. Erlinda Knight, who verbally acknowledged these results. Electronically Signed   By: Pedro Earls M.D.   On: 09/06/2020 17:42   MR ANGIO HEAD WO CONTRAST  Result Date: 09/15/2020 CLINICAL DATA:  Stroke EXAM:  MRA HEAD WITHOUT CONTRAST TECHNIQUE: Angiographic images of the Circle of Willis were acquired using MRA technique without intravenous contrast. COMPARISON:  MRI head 09/15/2020.  CT a 09/06/2020 FINDINGS: Anterior circulation: Motion degraded study. This significantly limits evaluation for small vessel disease. Internal carotid artery patent bilaterally without significant stenosis. Irregular areas of stenosis in the anterior and middle cerebral artery branches bilaterally consistent with widespread atherosclerotic disease. This is confirmed on the prior CTA. No large vessel occlusion. Posterior circulation: Both vertebral arteries are patent to the basilar. Basilar patent. Superior cerebellar arteries patent bilaterally. Occlusion of the proximal right posterior cerebral artery corresponding to right PCA infarct on MRI. There is severe stenosis of the left posterior cerebral artery which has distal flow. This appears progressive from the prior CT angiogram. Differences could be due to artifact from motion. Anatomic variants: None Other: None IMPRESSION: Motion degraded study Diffuse intracranial atherosclerotic disease, moderate to severe Occlusion right PCA. Severe stenosis left PCA with distal flow. This may have progressed since prior studies however is difficult to compare given the amount of motion on the MRA. Electronically Signed   By: Franchot Gallo M.D.   On: 09/15/2020 16:32   MR BRAIN WO CONTRAST  Result Date: 09/17/2020 CLINICAL DATA:  Stroke follow-up EXAM: MRI HEAD WITHOUT CONTRAST TECHNIQUE: Multiplanar, multiecho pulse sequences of the brain and surrounding structures were obtained without intravenous contrast. COMPARISON:  Two days ago FINDINGS: Brain: Acute left perforator infarct affecting the corona radiata and caudate body. More weakly restricted diffusion in the right PCA territory at the occipital lobe and right posterior corpus callosum. Subacute perforator infarct at the left pons. No  interval infarction is seen. No hemorrhagic complication. Remote left frontal operculum infarct. Chronic small vessel ischemia and lacunar infarcts. Vascular: No available T2 weighted imaging Skull and upper cervical spine: Negative Sinuses/Orbits: Negative Other: Progressively motion degraded. Truncated study per clinical request. IMPRESSION: Unchanged acute and subacute infarcts involving the infra and supratentorial brain. Electronically Signed   By: Monte Fantasia M.D.   On: 09/17/2020 06:17   MR BRAIN WO CONTRAST  Result Date: 09/15/2020 CLINICAL DATA:  Transient ischemic attack (TIA). Right leg weakness. EXAM: MRI HEAD WITHOUT CONTRAST TECHNIQUE: Multiplanar, multiecho pulse sequences of the brain and surrounding structures were obtained without intravenous contrast. COMPARISON:  Head CT 09/13/2020 and MRI 09/07/2020 FINDINGS: Brain: There is a new acute left lateral lenticulostriate territory infarct extending from the posterior aspect of  the left lentiform nucleus to the caudate body and involving the intervening deep white matter. There is residual diffusion weighted signal abnormality at the sites of the posterior circulation infarcts on the prior MRI (right temporal and occipital lobes, splenium of the corpus callosum, and left pons) without evidence of significant interval infarct extension. There is minimal petechial hemorrhage in the right occipital lobe. A chronic microhemorrhage is noted in the anterior right frontal lobe. Chronic lacunar infarcts are again noted involving the deep cerebral white matter bilaterally, basal ganglia, and thalami, and there is a chronic moderate-sized left MCA infarct near the anterior aspect of the left frontal operculum. T2 hyperintensities elsewhere in the cerebral white matter bilaterally are unchanged and nonspecific but compatible with moderate chronic small vessel ischemic disease. No mass, midline shift, or extra-axial fluid collection is identified. There  is mild cerebral atrophy. Vascular: Major intracranial vascular flow voids are preserved. Skull and upper cervical spine: Unremarkable bone marrow signal. Sinuses/Orbits: Unremarkable orbits. Minimal mucosal thickening or small mucous retention cyst in left sphenoid sinus. Small right and moderate left mastoid effusions. Other: None. IMPRESSION: 1. Acute left basal ganglia infarct. 2. Evolving subacute posterior circulation infarcts without extension from the 09/07/2020 MRI. 3. 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 Electronically Signed   By: Logan Bores M.D.   On: 09/15/2020 14:51   MR BRAIN WO CONTRAST  Result Date: 09/07/2020 CLINICAL DATA:  Stroke, follow-up.  Slurred speech. EXAM: MRI HEAD WITHOUT CONTRAST TECHNIQUE: Multiplanar, multiecho pulse sequences of the brain and surrounding structures were obtained without intravenous contrast. COMPARISON:  CT code stroke 09/06/2020. FINDINGS: Brain: Acute right PCA territory infarcts involving the right occipital lobe, parahippocampal/medial right temporal lobe, and splenium of the corpus callosum. Punctate acute infarct in the dorsal lateral. Right thalamus. There is associated edema and regional mass effect without midline shift. Additional left pontine acute infarct. No evidence of hemorrhagic transformation. Remote left frontal infarct with encephalomalacia and gliosis. Remote lacunar infarcts in bilateral basal ganglia,, corona radiata and thalami. Additional moderate scattered T2/FLAIR hyperintensities within the white matter, most likely related to chronic microvascular ischemic disease. Atrophy. Similar mild ventriculomegaly. Mild crowding of sulci at the vertex. No mass lesion. No midline shift. Basal cisterns are patent. No extra-axial fluid collection. Vascular: See recent CTA for further evaluation. Skull and upper cervical spine: Normal marrow signal. Sinuses/Orbits: Left sphenoid sinus retention cyst. Mild  ethmoid air cell mucosal thickening. No acute orbital findings. Other: Moderate bilateral mastoid effusions. IMPRESSION: 1. Acute right PCA territory infarcts, as described above. 2. Acute left pontine infarct. 3. Associated edema and regional mass effect without midline shift. No hemorrhagic transformation. 4. Remote left frontal infarct and remote lacunar infarcts in bilateral basal ganglia,, corona radiata and thalami. Moderate chronic microvascular ischemic disease. 5. Similar mild ventriculomegaly, most likely related to atrophy. Normal pressure hydrocephalus is thought less likely, but is a differential consideration. Electronically Signed   By: Margaretha Sheffield MD   On: 09/07/2020 09:22   DG Chest Port 1 View  Result Date: 09/15/2020 CLINICAL DATA:  Pt has history of 4 previous strokes since Dec. Pt typically has SOB, but worsened yesterday. Hx of COPD, diabetes, HTN. Current smoker. EXAM: PORTABLE CHEST 1 VIEW COMPARISON:  Chest x-ray 09/13/2020, CT chest 06/27/2020 FINDINGS: Enlarged cardiac silhouette. The heart size and mediastinal contours are unchanged. No focal consolidation. No pulmonary edema. No pleural effusion. No pneumothorax. No acute osseous abnormality. IMPRESSION: No active disease. Electronically Signed   By: Thomasena Edis  Mckinley Jewel M.D.   On: 09/15/2020 15:32   ECHOCARDIOGRAM COMPLETE  Result Date: 09/07/2020    ECHOCARDIOGRAM REPORT   Patient Name:   Craig Knight Date of Exam: 09/07/2020 Medical Rec #:  [id number]       Height:       67.0 in Accession #:    [id number]      Weight:       230.0 lb Date of Birth:  11/04/1965       BSA:          2.146 m Patient Age:    6 years        BP:           130/92 mmHg Patient Gender: M               HR:           62 bpm. Exam Location:  [facility] Procedure: 2D Echo, Cardiac Doppler and Color Doppler Indications:    Stroke  History:        Patient has prior history of Echocardiogram examinations, most                 recent 03/17/2017. CHF, Stroke  and COPD, Signs/Symptoms:Shortness                 of Breath and Chest Pain; Risk Factors:Hypertension, Diabetes                 and Current Smoker.  Sonographer:    Wenda Low Referring Phys: [place]  1. Left ventricular ejection fraction, by estimation, is 60 to 65%. The left ventricle has normal function. The left ventricle has no regional wall motion abnormalities. There is severe left ventricular hypertrophy. Left ventricular diastolic parameters  are consistent with Grade I diastolic dysfunction (impaired relaxation).  2. Right ventricular systolic function is normal. The right ventricular size is normal. Tricuspid regurgitation signal is inadequate for assessing PA pressure.  3. The mitral valve is normal in structure. No evidence of mitral valve regurgitation. No evidence of mitral stenosis.  4. The aortic valve has an indeterminant number of cusps. Aortic valve regurgitation is not visualized. No aortic stenosis is present. FINDINGS  Left Ventricle: Left ventricular ejection fraction, by estimation, is 60 to 65%. The left ventricle has normal function. The left ventricle has no regional wall motion abnormalities. The left ventricular internal cavity size was normal in size. There is  severe left ventricular hypertrophy. Left ventricular diastolic parameters are consistent with Grade I diastolic dysfunction (impaired relaxation). Normal left ventricular filling pressure. Right Ventricle: The right ventricular size is normal. No increase in right ventricular wall thickness. Right ventricular systolic function is normal. Tricuspid regurgitation signal is inadequate for assessing PA pressure. Left Atrium: Left atrial size was normal in size. Right Atrium: Right atrial size was normal in size. Pericardium: There is no evidence of pericardial effusion. Mitral Valve: The mitral valve is normal in structure. No evidence of mitral valve regurgitation. No evidence of mitral valve  stenosis. MV peak gradient, 3.7 mmHg. The mean mitral valve gradient is 1.0 mmHg. Tricuspid Valve: The tricuspid valve is normal in structure. Tricuspid valve regurgitation is not demonstrated. No evidence of tricuspid stenosis. Aortic Valve: The aortic valve has an indeterminant number of cusps. Aortic valve regurgitation is not visualized. No aortic stenosis is present. Aortic valve mean gradient measures 4.0 mmHg. Aortic valve peak gradient measures 6.9 mmHg. Aortic valve area, by VTI measures 2.38 cm.  Pulmonic Valve: The pulmonic valve was not well visualized. Pulmonic valve regurgitation is not visualized. No evidence of pulmonic stenosis. Aorta: The aortic root is normal in size and structure. Venous: The inferior vena cava was not well visualized. IAS/Shunts: No atrial level shunt detected by color flow Doppler.  LEFT VENTRICLE PLAX 2D LVIDd:         4.50 cm  Diastology LVIDs:         2.80 cm  LV e' medial:    6.05 cm/s LV PW:         1.93 cm  LV E/e' medial:  9.9 LV IVS:        1.94 cm  LV e' lateral:   4.88 cm/s LVOT diam:     2.00 cm  LV E/e' lateral: 12.3 LV SV:         68 LV SV Index:   32 LVOT Area:     3.14 cm  LEFT ATRIUM             Index       RIGHT ATRIUM           Index LA diam:        4.60 cm 2.14 cm/m  RA Area:     15.50 cm LA Vol (A2C):   28.4 ml 13.23 ml/m RA Volume:   39.20 ml  18.27 ml/m LA Vol (A4C):   48.8 ml 22.74 ml/m LA Biplane Vol: 36.9 ml 17.19 ml/m  AORTIC VALVE AV Area (Vmax):    3.09 cm AV Area (Vmean):   3.16 cm AV Area (VTI):     2.38 cm AV Vmax:           131.00 cm/s AV Vmean:          88.600 cm/s AV VTI:            0.285 m AV Peak Grad:      6.9 mmHg AV Mean Grad:      4.0 mmHg LVOT Vmax:         129.00 cm/s LVOT Vmean:        89.200 cm/s LVOT VTI:          0.216 m LVOT/AV VTI ratio: 0.76  AORTA Ao Root diam: 3.85 cm Ao Asc diam:  3.40 cm MITRAL VALVE MV Area (PHT): 2.76 cm    SHUNTS MV Area VTI:   1.90 cm    Systemic VTI:  0.22 m MV Peak grad:  3.7 mmHg     Systemic Diam: 2.00 cm MV Mean grad:  1.0 mmHg MV Vmax:       0.96 m/s MV Vmean:      45.1 cm/s MV Decel Time: 275 msec MV E velocity: 59.90 cm/s MV A velocity: 86.70 cm/s MV E/A ratio:  0.69 Carlyle Dolly MD Electronically signed by Carlyle Dolly MD Signature Date/Time: 09/07/2020/2:40:42 PM    Final    CT HEAD CODE STROKE WO CONTRAST  Result Date: 09/06/2020 CLINICAL DATA:  Code stroke.  Neuro deficit, acute stroke suspected. EXAM: CT HEAD WITHOUT CONTRAST TECHNIQUE: Contiguous axial images were obtained from the base of the skull through the vertex without intravenous contrast. COMPARISON:  CT head 11/22/2018. FINDINGS: Brain: Acute right PCA territory infarcts, including the right occipital lobe, right mesial temporal lobe and potentially the right thalamus and right aspect of the splenium of the corpus callosum. Associated edema and local mass effect. No midline shift. Mild hyperdensity in the right occipital lobe. Remote infarcts in the  left frontal lobe with encephalomalacia. Remote infarcts in bilateral corona radiata and basal ganglia. Additional moderate patchy white matter hypoattenuation, most likely chronic microvascular ischemic disease. Similar atrophy. Similar mild ventriculomegaly, mildly disproportionate to the degree of sulcal enlargement. Additionally there is similar crowding of sulci at the vertex and a mildly acute callosal angle. Vascular: Subtle hyperdensity within the right PCA (series 5, image 36; series 6, image 30), potentially thrombus. Intracranial atherosclerosis. Skull: No acute fracture. Sinuses/Orbits: Sinuses are largely clear.  Unremarkable orbits. Other: No mastoid effusions. IMPRESSION: 1. Acute right PCA territory infarcts, including the right occipital lobe, right mesial/parahippocampal temporal lobe and potentially the right thalamus and right aspect of the splenium of the corpus callosum. Associated edema and local mass effect without midline shift. 2. Mild  hyperdensity in the right occipital lobe may represent spared cortex and/or petechial hemorrhage and warrants attention on follow-up. 3. Subtle hyperdensity within the right PCA (series 5, image 36; series 6, image 30), concerning for thrombus given the above findings. Recommend CTA. 4. Remote infarcts in the left frontal lobe and remote lacunar infarcts in bilateral corona radiata and basal ganglia. 5. Moderate chronic microvascular ischemic disease. 6. Similar mild ventriculomegaly, mildly disproportionate to the degree of sulcal enlargement. Findings could be secondary to central predominant volume loss versus normal pressure hydrocephalus. Code stroke imaging results were communicated on 09/06/2020 at 4:46 pm to provider Dr. Laverta Baltimore via telephone, who verbally acknowledged these results. Electronically Signed   By: Margaretha Sheffield MD   On: 09/06/2020 16:59   CT ANGIO HEAD NECK W WO CM W PERF  Result Date: 09/16/2020 CLINICAL DATA:  Stroke follow-up.  Right arm numbness. EXAM: CT ANGIOGRAPHY HEAD AND NECK CT PERFUSION BRAIN TECHNIQUE: Multidetector CT imaging of the head and neck was performed using the standard protocol during bolus administration of intravenous contrast. Multiplanar CT image reconstructions and MIPs were obtained to evaluate the vascular anatomy. Carotid stenosis measurements (when applicable) are obtained utilizing NASCET criteria, using the distal internal carotid diameter as the denominator. Multiphase CT imaging of the brain was performed following IV bolus contrast injection. Subsequent parametric perfusion maps were calculated using RAPID software. CONTRAST:  133m OMNIPAQUE IOHEXOL 350 MG/ML SOLN COMPARISON:  Head MRI and MRA 09/15/2020. Head and neck CTA 09/06/2020. FINDINGS: CT HEAD FINDINGS Brain: Hypodensity in the posterior left basal ganglia/posterior limb of internal capsule/corona radiata corresponds to the acute infarct which is better demonstrated on yesterday's MRI. No acute  cortically based infarct, mass, midline shift, or extra-axial fluid collection is identified. Multiple subacute posterior circulation infarcts are again noted involving the posteromedial right temporal and occipital lobes, splenium of the corpus callosum, and left pons with minimal petechial hemorrhage again noted in the right occipital lobe. There is a chronic moderate-sized left frontal lobe MCA infarct, and there are moderate chronic small-vessel changes in the cerebral white matter with chronic lacunar infarcts in the deep cerebral white matter bilaterally, basal ganglia, and thalami. There is mild cerebral atrophy. Vascular: No hyperdense vessel. Skull: No fracture or suspicious osseous lesion. Sinuses/Orbits: Small mucous retention cyst in the left sphenoid sinus. Small to moderate mastoid effusions. Unremarkable orbits. Other: None. CTA NECK FINDINGS Aortic arch: Normal variant aortic arch branching pattern with common origin of the brachiocephalic and left common carotid arteries. Widely patent arch vessel origins. Right carotid system: Patent without evidence of stenosis, dissection, or significant atherosclerosis. Tortuous cervical ICA. Left carotid system: Patent with a small amount of calcified plaque at the carotid bifurcation. No evidence of  a significant stenosis or dissection. Tortuous cervical ICA. Vertebral arteries: Patent and codominant without evidence of a significant stenosis or dissection. Skeleton: Mild cervical spondylosis. Other neck: Bilateral thyroid nodules measuring up to 1.5 cm. No evidence of cervical lymphadenopathy. Upper chest: Clear lung apices. Review of the MIP images confirms the above findings CTA HEAD FINDINGS Anterior circulation: The internal carotid arteries are patent from skull base to carotid termini with mild atherosclerosis not resulting in a significant stenosis. ACAs and MCAs are patent without evidence of a proximal branch occlusion. There is widespread  atherosclerotic type irregularity including an unchanged mild proximal left M1 stenosis. Severe left A3 stenoses appear increased from the prior CTA. No aneurysm is identified. Posterior circulation: The intracranial vertebral arteries are patent to the basilar with atherosclerotic plaque resulting in an unchanged moderate stenosis on the left. The a severe proximal right AICA stenosis is again seen. Patent SCA is are identified bilaterally. The basilar artery is widely patent. There is a small left posterior communicating artery. There is unchanged occlusion of the right PCA near the P1-P2 junction. The left PCA is patent but diffusely irregular with a severe stenosis in the proximal P2 segment which appears increased from the prior CTA but less extensive than what was suggested on the more recent MRA. No aneurysm is identified. Venous sinuses: Hypoplastic and poorly visualized right transverse and sigmoid sinuses, unchanged. Other major dural venous sinuses are patent. Anatomic variants: Left A1 fenestration. Review of the MIP images confirms the above findings CT Brain Perfusion Findings: CBF (<30%) Volume: 0 mL Perfusion (Tmax>6.0s) volume: 45 mL Mismatch Volume: 45 mL Infarction Location: No acute core infarct is identified by automated processing. The region of delayed perfusion corresponds to the subacute right PCA infarct. IMPRESSION: CT HEAD: 1. Known acute left basal ganglia infarct and multiple subacute posterior circulation infarcts as shown on yesterday's MRI. 2. No definite new infarct or intracranial hemorrhage. 3. Chronic ischemia with multiple old infarcts as above. CTA HEAD AND NECK: 1. No emergent large vessel occlusion. 2. Unchanged right PCA occlusion. 3. Advanced intracranial atherosclerosis including severe proximal left PCA and left A3 stenoses which appear increased from the prior CTA. 4. Widely patent cervical carotid arteries. 5. Thyroid nodules measuring up to 1.5 cm. Recommend thyroid US  (ref: J Am Coll Radiol. 2015 Feb;12(2): 143-50). CT PERFUSION: No evidence of acute core infarct or penumbra identified by automated processing. Electronically Signed   By: Logan Bores M.D.   On: 09/16/2020 10:32

## 2020-09-17 NOTE — Plan of Care (Signed)
  Problem: Education: Goal: Knowledge of disease or condition will improve 09/17/2020 0803 by Bess Harvest, RN Outcome: Progressing 09/16/2020 2024 by Bess Harvest, RN Outcome: Progressing Goal: Knowledge of secondary prevention will improve 09/17/2020 0803 by Bess Harvest, RN Outcome: Progressing 09/16/2020 2024 by Bess Harvest, RN Outcome: Progressing Goal: Knowledge of patient specific risk factors addressed and post discharge goals established will improve 09/17/2020 0803 by Bess Harvest, RN Outcome: Progressing 09/16/2020 2024 by Bess Harvest, RN Outcome: Progressing   Problem: Education: Goal: Knowledge of General Education information will improve Description: Including pain rating scale, medication(s)/side effects and non-pharmacologic comfort measures Outcome: Progressing   Problem: Health Behavior/Discharge Planning: Goal: Ability to manage health-related needs will improve Outcome: Progressing   Problem: Clinical Measurements: Goal: Ability to maintain clinical measurements within normal limits will improve Outcome: Progressing Goal: Will remain free from infection Outcome: Progressing Goal: Diagnostic test results will improve Outcome: Progressing Goal: Respiratory complications will improve Outcome: Progressing Goal: Cardiovascular complication will be avoided Outcome: Progressing   Problem: Activity: Goal: Risk for activity intolerance will decrease Outcome: Progressing   Problem: Nutrition: Goal: Adequate nutrition will be maintained Outcome: Progressing   Problem: Coping: Goal: Level of anxiety will decrease Outcome: Progressing   Problem: Elimination: Goal: Will not experience complications related to bowel motility Outcome: Progressing Goal: Will not experience complications related to urinary retention Outcome: Progressing   Problem: Pain Managment: Goal: General experience of comfort will improve Outcome: Progressing    Problem: Safety: Goal: Ability to remain free from injury will improve Outcome: Progressing   Problem: Skin Integrity: Goal: Risk for impaired skin integrity will decrease Outcome: Progressing

## 2020-09-17 NOTE — Progress Notes (Signed)
Pt refused cpap for tonight 

## 2020-09-17 NOTE — Evaluation (Signed)
Occupational Therapy Evaluation Patient Details Name: Craig Knight MRN: [id number] DOB: 05-02-1965 Today's Date: 09/17/2020    History of Present Illness Pt is a 55 y.o. male who presented 09/15/20 with SOB and worsening R sided weakness. Pt admitted 09/06/20-09/08/20 with R-sided weakness and speech changes in which they found pt had large R PCA stroke, left pontine stroke, and remote L frontal and lacunar infarcts in bil basal ganglia, corona radiata and thalami. MRI 09/15/20 revealed new acute L basal ganglia infarct and evolving R PCA stroke, and MRA revealed occlusion at R PCA and severe stenosis L PCA with distal flow. Outside window for tPA. PMH COPD, DM, HTN, CVA   Clinical Impression   Pt admitted with above. He demonstrates the below listed deficits and will benefit from continued OT to maximize safety and independence with BADLs.  Pt presents to OT with Lt hemiparesis, Lt homonymous hemianopia, impaired balance, impaired cognition. He currently requires mod A for UB ADLs, and max A for LB ADLs.  He lives with his wife and prior to previous CVA, was fully independent with ADLs and functional mobility.  Recommend CIR level rehab to allow him to maximize safety and independence with ADLs.       Follow Up Recommendations  CIR    Equipment Recommendations  None recommended by OT    Recommendations for Other Services Rehab consult     Precautions / Restrictions Precautions Precautions: Fall Precaution Comments: L hemianopia; Lt hemiparesis      Mobility Bed Mobility Overal bed mobility: Needs Assistance Bed Mobility: Supine to Sit;Sit to Supine     Supine to sit: Mod assist Sit to supine: Min assist   General bed mobility comments: Pt insists he can't move Rt side.  To get out of bed, he required assist for Rt LE and assist to lift trunk.  Upon return to supine, he needed assist only for Rt LE - he was able to lift it 1/2 way onto bed    Transfers Overall transfer level:  Needs assistance Equipment used: 1 person hand held assist Transfers: Sit to/from [facility] Sit to Stand: Min assist Stand pivot transfers: Mod assist       General transfer comment: assist to steady, and assist to pivot Rt foot as well as assist for safetly    Balance Overall balance assessment: Needs assistance Sitting-balance support: No upper extremity supported;Feet supported Sitting balance-Leahy Scale: Fair     Standing balance support: Single extremity supported Standing balance-Leahy Scale: Poor Standing balance comment: reliant on support                           ADL either performed or assessed with clinical judgement   ADL Overall ADL's : Needs assistance/impaired Eating/Feeding: Set up;Sitting   Grooming: Wash/dry hands;Wash/dry face;Oral care;Minimal assistance;Sitting   Upper Body Bathing: Moderate assistance;Sitting   Lower Body Bathing: Maximal assistance;Sit to/from stand   Upper Body Dressing : Moderate assistance;Sitting   Lower Body Dressing: Maximal assistance;Sit to/from stand   Toilet Transfer: Minimal assistance;Stand-pivot;BSC   Toileting- Clothing Manipulation and Hygiene: Maximal assistance;Sit to/from stand       Functional mobility during ADLs: Minimal assistance       Vision Baseline Vision/History: No visual deficits Patient Visual Report: Blurring of vision Vision Assessment?: Yes Eye Alignment: Within Functional Limits Alignment/Gaze Preference: Within Defined Limits Tracking/Visual Pursuits: Able to track stimulus in all quads without difficulty Visual Fields: Left homonymous hemianopsia  Perception Perception Perception Tested?: Yes Perception Deficits: Inattention/neglect (mild Rt inattention noted)   Praxis Praxis Praxis tested?: Deficits Deficits: Initiation;Ideomotor    Pertinent Vitals/Pain Pain Assessment: No/denies pain     Hand Dominance Right   Extremity/Trunk Assessment  Upper Extremity Assessment Upper Extremity Assessment: LUE deficits/detail RUE Deficits / Details: PROM WFL.  Pt with trace activation Lt shoulder and elbow.  He demonstrates isolated thumb movements and active finger extension LUE Coordination: decreased gross motor;decreased fine motor   Lower Extremity Assessment Lower Extremity Assessment: Defer to PT evaluation   Cervical / Trunk Assessment Cervical / Trunk Assessment: Normal   Communication Communication Communication: Expressive difficulties (dysarthric)   Cognition Arousal/Alertness: Lethargic;Awake/alert Behavior During Therapy: Flat affect Overall Cognitive Status: Impaired/Different from baseline Area of Impairment: Attention;Following commands;Problem solving;Awareness                   Current Attention Level: Sustained   Following Commands: Follows one step commands inconsistently   Awareness: Intellectual Problem Solving: Slow processing;Decreased initiation;Difficulty sequencing;Requires verbal cues;Requires tactile cues     General Comments  wife present throughout session    Exercises     Shoulder Instructions      [place] expects to be discharged to:: Private residence Living Arrangements: Spouse/significant other;Children Available Help at Discharge: Family;Available 24 hours/day Type of Home: House Home Access: Stairs to enter [employer] of Steps: 2 Entrance Stairs-Rails: None Home Layout: One level     Bathroom Shower/Tub: Occupational psychologist: Standard Bathroom Accessibility: Yes How Accessible: Accessible via walker Home Equipment: None      Lives With: Spouse;Daughter    Prior Functioning/Environment Level of Independence: Needs assistance  Gait / Transfers Assistance Needed: Pt required min A - min guard assist with ambulation per OP PT notes ADL's / Homemaking Assistance Needed: Per OP OT notes, pt required mod I -supervision for  ADLs            OT Problem List: Decreased strength;Decreased range of motion;Decreased activity tolerance;Impaired balance (sitting and/or standing);Impaired vision/perception;Decreased coordination;Decreased cognition;Decreased safety awareness;Decreased knowledge of use of DME or AE;Impaired UE functional use      OT Treatment/Interventions: Self-care/ADL training;Neuromuscular education;DME and/or AE instruction;Splinting;Therapeutic activities;Cognitive remediation/compensation;Visual/perceptual remediation/compensation;Patient/family education;Balance training    OT Goals(Current goals can be found in the care plan section) Acute Rehab OT Goals Patient Stated Goal: to be able to take care of self OT Goal Formulation: With patient/family Time For Goal Achievement: 10/01/20 Potential to Achieve Goals: Good ADL Goals Pt Will Perform Grooming: (P) with set-up;sitting Pt Will Perform Upper Body Bathing: (P) with set-up;with supervision;sitting Pt Will Perform Lower Body Bathing: (P) with min assist;sit to/from stand Pt Will Perform Upper Body Dressing: (P) with min assist;sitting Pt Will Perform Lower Body Dressing: (P) with min assist;sit to/from stand Pt Will Transfer to Toilet: (P) regular height toilet;bedside commode;grab bars;with min guard assist;stand pivot transfer Pt Will Perform Toileting - Clothing Manipulation and hygiene: (P) with min assist;sit to/from stand  OT Frequency: Min 2X/week   Barriers to D/C:            Co-evaluation              AM-PAC OT "6 Clicks" Daily Activity     Outcome Measure Help from another person eating meals?: A Little Help from another person taking care of personal grooming?: A Little Help from another person toileting, which includes using toliet, bedpan, or urinal?: A Lot Help from another person bathing (including washing,  rinsing, drying)?: A Lot Help from another person to put on and taking off regular upper body clothing?:  A Lot Help from another person to put on and taking off regular lower body clothing?: A Lot 6 Click Score: 14   End of Session Nurse Communication: Mobility status  Activity Tolerance: Patient tolerated treatment well Patient left: in chair;with call bell/phone within reach;with chair alarm set;with family/visitor present  OT Visit Diagnosis: Unsteadiness on feet (R26.81);Hemiplegia and hemiparesis;Cognitive communication deficit (R41.841) Symptoms and signs involving cognitive functions: Cerebral infarction Hemiplegia - Right/Left: Right Hemiplegia - dominant/non-dominant: Dominant Hemiplegia - caused by: Cerebral infarction                Time: 1140-1215 OT Time Calculation (min): 35 min Charges:  OT General Charges $OT Visit: 1 Visit OT Evaluation $OT Eval Moderate Complexity: 1 Mod OT Treatments $Neuromuscular Re-education: 8-22 mins  Nilsa Nutting., OTR/L Acute Rehabilitation Services Pager [phone] Office [phone]   Lucille Passy M 09/17/2020, 1:26 PM

## 2020-09-17 NOTE — Progress Notes (Signed)
STROKE TEAM PROGRESS NOTE   INTERVAL HISTORY Wife at bedside, patient lying in bed, upset, right upper and lower extremity continue to have significant weakness.  MRI repeat showed slight increase size of left CR infarct which is likely normal evaluation.  OBJECTIVE Vitals:   09/17/20 0425 09/17/20 0738 09/17/20 0749 09/17/20 1120  BP: (!) 140/96 (!) 147/93  (!) 160/97  Pulse: (!) 56 (!) 57  70  Resp: '18 16  20  '$ Temp: 98.2 F (36.8 C) 97.7 F (36.5 C)  98.5 F (36.9 C)  TempSrc: Oral Oral  Oral  SpO2: 98% 96% 96% 97%  Weight:      Height:        CBC:  Recent Labs  Lab 09/15/20 1348 09/17/20 0211  WBC 12.8* 12.3*  NEUTROABS 10.6* 8.7*  HGB 14.9 14.7  HCT 43.9 44.3  MCV 80.6 79.7*  PLT 284 0000000    Basic Metabolic Panel:  Recent Labs  Lab 09/15/20 1348 09/17/20 0211  NA 137 137  K 3.5 3.9  CL 106 104  CO2 20* 23  GLUCOSE 88 99  BUN 33* 35*  CREATININE 2.01* 2.00*  CALCIUM 9.7 9.2  MG  --  2.2    Lipid Panel:     Component Value Date/Time   CHOL 115 09/16/2020 0406   TRIG 77 09/16/2020 0406   HDL 33 (L) 09/16/2020 0406   CHOLHDL 3.5 09/16/2020 0406   VLDL 15 09/16/2020 0406   LDLCALC 67 09/16/2020 0406   HgbA1c:  Lab Results  Component Value Date   HGBA1C 7.6 (H) 09/07/2020   Urine Drug Screen:     Component Value Date/Time   LABOPIA NONE DETECTED 09/07/2020 1803   COCAINSCRNUR NONE DETECTED 09/07/2020 1803   LABBENZ NONE DETECTED 09/07/2020 1803   AMPHETMU NONE DETECTED 09/07/2020 1803   THCU NONE DETECTED 09/07/2020 1803   LABBARB NONE DETECTED 09/07/2020 1803    Alcohol Level     Component Value Date/Time   ETH <10 09/06/2020 1630    IMAGING   MR ANGIO HEAD WO CONTRAST  Result Date: 09/15/2020 CLINICAL DATA:  Stroke EXAM: MRA HEAD WITHOUT CONTRAST TECHNIQUE: Angiographic images of the Circle of Willis were acquired using MRA technique without intravenous contrast. COMPARISON:  MRI head 09/15/2020.  CT a 09/06/2020 FINDINGS: Anterior  circulation: Motion degraded study. This significantly limits evaluation for small vessel disease. Internal carotid artery patent bilaterally without significant stenosis. Irregular areas of stenosis in the anterior and middle cerebral artery branches bilaterally consistent with widespread atherosclerotic disease. This is confirmed on the prior CTA. No large vessel occlusion. Posterior circulation: Both vertebral arteries are patent to the basilar. Basilar patent. Superior cerebellar arteries patent bilaterally. Occlusion of the proximal right posterior cerebral artery corresponding to right PCA infarct on MRI. There is severe stenosis of the left posterior cerebral artery which has distal flow. This appears progressive from the prior CT angiogram. Differences could be due to artifact from motion. Anatomic variants: None Other: None IMPRESSION: Motion degraded study Diffuse intracranial atherosclerotic disease, moderate to severe Occlusion right PCA. Severe stenosis left PCA with distal flow. This may have progressed since prior studies however is difficult to compare given the amount of motion on the MRA. Electronically Signed   By: Franchot Gallo M.D.   On: 09/15/2020 16:32   MR BRAIN WO CONTRAST  Result Date: 09/15/2020 CLINICAL DATA:  Transient ischemic attack (TIA). Right leg weakness. EXAM: MRI HEAD WITHOUT CONTRAST TECHNIQUE: Multiplanar, multiecho pulse sequences of the brain  and surrounding structures were obtained without intravenous contrast. COMPARISON:  Head CT 09/13/2020 and MRI 09/07/2020 FINDINGS: Brain: There is a new acute left lateral lenticulostriate territory infarct extending from the posterior aspect of the left lentiform nucleus to the caudate body and involving the intervening deep white matter. There is residual diffusion weighted signal abnormality at the sites of the posterior circulation infarcts on the prior MRI (right temporal and occipital lobes, splenium of the corpus callosum,  and left pons) without evidence of significant interval infarct extension. There is minimal petechial hemorrhage in the right occipital lobe. A chronic microhemorrhage is noted in the anterior right frontal lobe. Chronic lacunar infarcts are again noted involving the deep cerebral white matter bilaterally, basal ganglia, and thalami, and there is a chronic moderate-sized left MCA infarct near the anterior aspect of the left frontal operculum. T2 hyperintensities elsewhere in the cerebral white matter bilaterally are unchanged and nonspecific but compatible with moderate chronic small vessel ischemic disease. No mass, midline shift, or extra-axial fluid collection is identified. There is mild cerebral atrophy. Vascular: Major intracranial vascular flow voids are preserved. Skull and upper cervical spine: Unremarkable bone marrow signal. Sinuses/Orbits: Unremarkable orbits. Minimal mucosal thickening or small mucous retention cyst in left sphenoid sinus. Small right and moderate left mastoid effusions. Other: None. IMPRESSION: 1. Acute left basal ganglia infarct. 2. Evolving subacute posterior circulation infarcts without extension from the 09/07/2020 MRI. 3. 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 Electronically Signed   By: Logan Bores M.D.   On: 09/15/2020 14:51    CT ANGIO HEAD NECK W WO CM W PERF  Result Date: 09/16/2020 CLINICAL DATA:  Stroke follow-up.  Right arm numbness. EXAM: CT ANGIOGRAPHY HEAD AND NECK CT PERFUSION BRAIN TECHNIQUE: Multidetector CT imaging of the head and neck was performed using the standard protocol during bolus administration of intravenous contrast. Multiplanar CT image reconstructions and MIPs were obtained to evaluate the vascular anatomy. Carotid stenosis measurements (when applicable) are obtained utilizing NASCET criteria, using the distal internal carotid diameter as the denominator. Multiphase CT imaging of the brain was performed  following IV bolus contrast injection. Subsequent parametric perfusion maps were calculated using RAPID software. CONTRAST:  163m OMNIPAQUE IOHEXOL 350 MG/ML SOLN COMPARISON:  Head MRI and MRA 09/15/2020. Head and neck CTA 09/06/2020. FINDINGS: CT HEAD FINDINGS Brain: Hypodensity in the posterior left basal ganglia/posterior limb of internal capsule/corona radiata corresponds to the acute infarct which is better demonstrated on yesterday's MRI. No acute cortically based infarct, mass, midline shift, or extra-axial fluid collection is identified. Multiple subacute posterior circulation infarcts are again noted involving the posteromedial right temporal and occipital lobes, splenium of the corpus callosum, and left pons with minimal petechial hemorrhage again noted in the right occipital lobe. There is a chronic moderate-sized left frontal lobe MCA infarct, and there are moderate chronic small-vessel changes in the cerebral white matter with chronic lacunar infarcts in the deep cerebral white matter bilaterally, basal ganglia, and thalami. There is mild cerebral atrophy. Vascular: No hyperdense vessel. Skull: No fracture or suspicious osseous lesion. Sinuses/Orbits: Small mucous retention cyst in the left sphenoid sinus. Small to moderate mastoid effusions. Unremarkable orbits. Other: None. CTA NECK FINDINGS Aortic arch: Normal variant aortic arch branching pattern with common origin of the brachiocephalic and left common carotid arteries. Widely patent arch vessel origins. Right carotid system: Patent without evidence of stenosis, dissection, or significant atherosclerosis. Tortuous cervical ICA. Left carotid system: Patent with a small amount of calcified plaque  at the carotid bifurcation. No evidence of a significant stenosis or dissection. Tortuous cervical ICA. Vertebral arteries: Patent and codominant without evidence of a significant stenosis or dissection. Skeleton: Mild cervical spondylosis. Other neck:  Bilateral thyroid nodules measuring up to 1.5 cm. No evidence of cervical lymphadenopathy. Upper chest: Clear lung apices. Review of the MIP images confirms the above findings CTA HEAD FINDINGS Anterior circulation: The internal carotid arteries are patent from skull base to carotid termini with mild atherosclerosis not resulting in a significant stenosis. ACAs and MCAs are patent without evidence of a proximal branch occlusion. There is widespread atherosclerotic type irregularity including an unchanged mild proximal left M1 stenosis. Severe left A3 stenoses appear increased from the prior CTA. No aneurysm is identified. Posterior circulation: The intracranial vertebral arteries are patent to the basilar with atherosclerotic plaque resulting in an unchanged moderate stenosis on the left. The a severe proximal right AICA stenosis is again seen. Patent SCA is are identified bilaterally. The basilar artery is widely patent. There is a small left posterior communicating artery. There is unchanged occlusion of the right PCA near the P1-P2 junction. The left PCA is patent but diffusely irregular with a severe stenosis in the proximal P2 segment which appears increased from the prior CTA but less extensive than what was suggested on the more recent MRA. No aneurysm is identified. Venous sinuses: Hypoplastic and poorly visualized right transverse and sigmoid sinuses, unchanged. Other major dural venous sinuses are patent. Anatomic variants: Left A1 fenestration. Review of the MIP images confirms the above findings CT Brain Perfusion Findings: CBF (<30%) Volume: 0 mL Perfusion (Tmax>6.0s) volume: 45 mL Mismatch Volume: 45 mL Infarction Location: No acute core infarct is identified by automated processing. The region of delayed perfusion corresponds to the subacute right PCA infarct. IMPRESSION: CT HEAD: 1. Known acute left basal ganglia infarct and multiple subacute posterior circulation infarcts as shown on yesterday's  MRI. 2. No definite new infarct or intracranial hemorrhage. 3. Chronic ischemia with multiple old infarcts as above. CTA HEAD AND NECK: 1. No emergent large vessel occlusion. 2. Unchanged right PCA occlusion. 3. Advanced intracranial atherosclerosis including severe proximal left PCA and left A3 stenoses which appear increased from the prior CTA. 4. Widely patent cervical carotid arteries. 5. Thyroid nodules measuring up to 1.5 cm. Recommend thyroid US (ref: J Am Coll Radiol. 2015 Feb;12(2): 143-50). CT PERFUSION: No evidence of acute core infarct or penumbra identified by automated processing. Electronically Signed   By: Logan Bores M.D.   On: 09/16/2020 10:32    ECG - SR rate 96 BPM. (See cardiology reading for complete details)   PHYSICAL EXAM  Temp:  [97.7 F (36.5 C)-98.5 F (36.9 C)] 98.5 F (36.9 C) (07/24 1120) Pulse Rate:  [56-70] 70 (07/24 1120) Resp:  [16-20] 20 (07/24 1120) BP: (140-179)/(89-99) 160/97 (07/24 1120) SpO2:  [96 %-100 %] 97 % (07/24 1120)  General - Well nourished, well developed, in no apparent distress.  Ophthalmologic - fundi not visualized due to noncooperation.  Cardiovascular - Regular rhythm and rate.  Mental Status -  Level of arousal and orientation to time, place, and person were intact. Language including expression, naming, repetition, comprehension was assessed and found intact.  Cranial Nerves II - XII - II - left hemianopia III, IV, VI - Extraocular movements intact. V - Facial sensation intact bilaterally. VII - right facial droop VIII - Hearing & vestibular intact bilaterally. X - Palate elevates symmetrically. XI - Chin turning & shoulder shrug intact  bilaterally. XII - Tongue protrusion intact.  Motor Strength - The patient's strength was normal in left upper and lower extremities, however, right UE proximal 2-/5, bicep 2/5, tricep 2/5, finger grip 2+/5, RLE proximal 2/5, knee extension 1/5, toe movement 15.  Bulk was normal and  fasciculations were absent.   Motor Tone - Muscle tone was assessed at the neck and appendages and was normal.  Reflexes - The patient's reflexes were symmetrical in all extremities and he had no pathological reflexes.  Sensory - Light touch, temperature/pinprick were assessed and were symmetrical.    Coordination - The patient had normal movements in left hand with no ataxia or dysmetria.  Tremor was absent.  Gait and Station - deferred.     ASSESSMENT/PLAN Craig Knight is a 55 y.o. male with history of DM2, COPD, ongoing tobacco use, HTN, OSA, recent large R PCA stroke and left pontine stroke last week with residual L hemianopsia and mild right sided weakness and right facial droop with unclear stroke etiology who [facility] ED with shortness of breath and worsening R sided weakness. He had an MRI Brain which demonstrated new acute left basal ganglia stroke and evolving R PCA stroke. He did not receive IV t-PA due to late presentation (>4.5 hours from time of onset).  Stroke with capsular warning syndrome - acute left CR stroke, source unclear.  CT head - Evolving right PCA infarct without interval hemorrhage. Evolving left pontine infarct without interval hemorrhage. No definite new acute intracranial abnormality since prior study from 09/06/2020. Multiple chronic infarcts involving left frontal lobe, bilateral white matter, basal ganglia and thalamus. MRI head 09/15/20 - Acute left CR infarct. Evolving subacute posterior circulation infarcts without extension from the 09/07/2020 MRI. Multiple old infarcts. MRI Head 09/17/20 - Unchanged acute and subacute infarcts involving the infra and supratentorial brain. MRA head - Moderate to severe Occlusion right PCA. Severe stenosis left PCA with distal flow. CTA H&N - unchanged left PCA occlusion, underwent intracranial stenosis with severe proximal left PCA and left A3 stenosis CT Perfusion no new perfusion deficit Recommend TEE  and loop recorder for further embolic work-up.  LDL - 67 HgbA1c pending UDS - negative VTE prophylaxis - Lovenox 40 mg daily Aspirin 81 mg daily and Brilinta daily prior to admission, now on aspirin 81 mg daily and Brilinta (ticagrelor) 90 mg bid Patient will be counseled to be compliant with his antithrombotic medications Ongoing aggressive stroke risk factor management Therapy recommendations:  CIR Disposition:  Pending  CHF Myoview 2018 showed EF 38% However TTE showed EF normal range Has been following with cardiology, on Imdur Recommend TEE and loop recorder for further work-up.    History of stroke 09/06/2020 admitted for right-sided weakness, slurred speech and right facial droop.  Exam found also left hemianopia.  CT showed subacute left PCA infarct.  CTA head and neck right P1/P2 occlusion, bilateral M3, right A3, left VA and left P2 moderate stenosis.  MRI showed right PCA large infarct with left pontine infarct.  EF 60 to 65%.  LDL 108, A1c 7.6.  Etiology concerning for cardioembolic source, recommend outpatient TEE and loop recorder since patient not willing to stay over the weekend.  Put on aspirin 81 and Brilinta for 30 days on discharge and then back to aspirin 81 and Plavix 75 home dose.  Diabetes HgbA1c pending goal < 7.0 Uncontrolled CBG monitoring SSI DM education and close PCP follow up   Hypertension Stable on the high end Permissive  hypertension (OK if <220/120) for 24-48 hours post stroke and then gradually normalized within 5-7 days. Avoid low BP On IVF  Close monitoring   Hyperlipidemia Home meds: Lipitor 80 LDL 67 goal < 70 Continue on Lipitor 80 Continue statin at discharge   CKD CKD - stage 3b - Cre - 1.81->2.41->2.40- 1.47->2.35->2.01->2.00 Currently getting NS IV at 75 cc / hr  Tobacco abuse Recently quit smoking Smoking cessation counseling again provided Pt is willing to continue to quit  Other Stroke Risk Factors ETOH use, advised to  drink no more than 1 alcoholic beverage per day. Obesity, Body mass index is 35.24 kg/m., recommend weight loss, diet and exercise as appropriate  Family hx stroke (mother; brother; and grandmother)  Obstructive sleep apnea  Other Active Problems Leukocytosis - WBC's 14.4->12.8->12.3 (afebrile)   Hospital day # 2  Rosalin Hawking, MD PhD Stroke Neurology 09/17/2020 4:44 PM    To contact Stroke Continuity provider, please refer to [email or web address] After hours, contact General Neurology

## 2020-09-17 NOTE — Evaluation (Signed)
Speech Language Pathology Evaluation Patient Details Name: Craig Knight MRN: [id number] DOB: 04-Jun-1965 Today's Date: 09/17/2020 Time: [id number] SLP Time Calculation (min) (ACUTE ONLY): 14 min  Problem List:  Patient Active Problem List   Diagnosis Date Noted   Acute CVA (cerebrovascular accident) ([place]) 09/06/2020   Stroke ([place]) 11/23/2018   Numbness 11/23/2018   CVA (cerebral vascular accident) ([place]) 11/23/2018   Lung nodule 11/17/2017   Chest pressure 11/22/2016   Shortness of breath 11/22/2016   Depression, major, single episode, moderate ([place]) 10/14/2016   Special screening for malignant neoplasms, colon    Chronic obstructive pulmonary disease ([place]) 06/08/2016   Hypertensive emergency 02/16/2016   Hypertensive urgency 06/07/2015   CKD (chronic kidney disease), stage III ([place]) 06/07/2015   Pain in the chest    Elevated troponin    Chronic diastolic CHF (congestive heart failure) ([place]) 09/17/2013   DM type 2 (diabetes mellitus, type 2) ([place]) 09/15/2013   Chest pain 08/01/2011   HTN (hypertension) 08/01/2011   Chest pain 07/22/2011   Renal insufficiency 07/22/2011   Tobacco abuse 07/22/2011   Hypertension    Past Medical History:  Past Medical History:  Diagnosis Date   Asthma    Bronchitis    COPD (chronic obstructive pulmonary disease) ([place])    Diabetes mellitus without complication ([place])    Heart murmur [id number]   soft systolic murmur 1/6   Hypertension    Sleep apnea    Stroke [facility])    Past Surgical History:  Past Surgical History:  Procedure Laterality Date   COLONOSCOPY N/A 09/18/2016   Procedure: COLONOSCOPY;  Surgeon: Danie Binder, MD;  Location: [facility];  Service: Endoscopy;  Laterality: N/A;  10:30 AM   lipoma removal     HPI:  Pt is a 55 year old male with medical hx significant for: HTN, DM, COPD, sleep apnea, asthma, bronchitis, CVA. Pt was recently hospitalized 09/06/20-09/08/20 d/t large right PCA territory infarct and left pontine  stroke with residual mild right side weakness, left hemianopsia, and right facial droop. Pt presented to ED at [facility] on 7/22 with c/o shortness of breath and worsening right side weakness. MRI revealed new acute left basal ganglia infarct and evolving right PCA stroke.  MRA head and neck revealed occlusion of right PCA  and severe stenosis of left PCA. Pt transferred to [facility] for further neurology workup.   Assessment / Plan / Recommendation Clinical Impression  Pt was seen for a cognitive-linguistic evaluation in the setting of acute left basal ganglia infarct and evolving right PCA CVA.  He was encountered awake/alert and he was agreeable to this evaluation.  Pt reported that he lives with his wife and daughter, and that his wife is responsible for their financial management, but that he was independent with most other IADLs at baseline.  No family present to corroborate the pt's statement or establish a cognitive baseline.  Pt exhibited moderate dysarthria c/b reduced intelligibility at the word, sentence, and conversation level.  Pt benefited from cues to over-articulate when speaking in order to increase speech intelligibility.  Mild expressive language deficits were observed c/b 70% accuracy with confrontational naming tasks, improving to 100% accuracy given phonemic cues.  Receptive language appeared to be [facility].  Mild short-term memory deficits were additionally observed, but basic problem solving, attention, and safety judgement appeared to be functional.  Recommend continuation of skilled ST at inpatient rehabilitation targeting deficits.  Additionally recommend assistance with IADLs at time of discharge. SLP  will continue to f/u acutely.    SLP Assessment  SLP Recommendation/Assessment: Patient needs continued Speech Lanaguage Pathology Services SLP Visit Diagnosis: Aphasia (R47.01);Cognitive communication deficit (R41.841)    Follow Up Recommendations  Inpatient Rehab     Frequency and Duration min 2x/week  2 weeks      SLP Evaluation Cognition  Overall Cognitive Status: Impaired/Different from baseline Arousal/Alertness: Awake/alert Orientation Level: Oriented X4 Attention: Focused Focused Attention: Appears intact Memory: Impaired Memory Impairment: Decreased short term memory Decreased Short Term Memory: Verbal complex Immediate Memory Recall: Sock;Blue;Bed Memory Recall Sock: Without Cue Memory Recall Blue: Without Cue Memory Recall Bed: With Cue Awareness: Appears intact Problem Solving: Appears intact Safety/Judgment: Appears intact       Comprehension  Auditory Comprehension Overall Auditory Comprehension: Appears within functional limits for tasks assessed Yes/No Questions: Within Functional Limits Commands: Within Functional Limits    Expression Expression Primary Mode of Expression: Verbal Verbal Expression Initiation: No impairment Level of Generative/Spontaneous Verbalization: Conversation Repetition: No impairment Naming: Impairment Responsive: Not tested Confrontation: Impaired Convergent: 50-74% accurate Divergent: Not tested Pragmatics: No impairment Interfering Components: Speech intelligibility Effective Techniques: Phonemic cues Written Expression Dominant Hand: Right   Oral / Motor  Oral Motor/Sensory Function Overall Oral Motor/Sensory Function: Moderate impairment Facial ROM: Reduced right;Suspected CN VII (facial) dysfunction Facial Symmetry: Abnormal symmetry right Facial Strength: Reduced right Facial Sensation: Within Functional Limits Lingual Symmetry: Abnormal symmetry right;Suspected CN XII (hypoglossal) dysfunction Lingual Strength: Reduced Motor Speech Overall Motor Speech: Impaired Respiration: Within functional limits Phonation: Normal Resonance: Within functional limits Articulation: Impaired Level of Impairment: Word Intelligibility: Intelligibility reduced Word: 50-74%  accurate Phrase: 25-49% accurate Sentence: 25-49% accurate Conversation: 25-49% accurate Motor Planning: Witnin functional limits   GO                   Colin Mulders., M.S., CCC-SLP Acute Rehabilitation Services Office: [phone]  Elvia Collum [facility] 09/17/2020, 10:49 AM

## 2020-09-18 ENCOUNTER — Inpatient Hospital Stay (HOSPITAL_COMMUNITY): Payer: Medicare Other

## 2020-09-18 ENCOUNTER — Encounter (HOSPITAL_COMMUNITY): Payer: Self-pay | Admitting: Internal Medicine

## 2020-09-18 ENCOUNTER — Inpatient Hospital Stay (HOSPITAL_COMMUNITY): Payer: Medicare Other | Admitting: Anesthesiology

## 2020-09-18 ENCOUNTER — Encounter (HOSPITAL_COMMUNITY)
Admission: EM | Disposition: A | Discharge status: Rehabilitation Facility | Payer: Self-pay | Source: Home / Self Care | Attending: Internal Medicine

## 2020-09-18 DIAGNOSIS — I639 Cerebral infarction, unspecified: Secondary | ICD-10-CM | POA: Diagnosis not present

## 2020-09-18 DIAGNOSIS — I63531 Cerebral infarction due to unspecified occlusion or stenosis of right posterior cerebral artery: Secondary | ICD-10-CM | POA: Diagnosis not present

## 2020-09-18 DIAGNOSIS — I351 Nonrheumatic aortic (valve) insufficiency: Secondary | ICD-10-CM | POA: Diagnosis not present

## 2020-09-18 DIAGNOSIS — I5032 Chronic diastolic (congestive) heart failure: Secondary | ICD-10-CM | POA: Diagnosis not present

## 2020-09-18 DIAGNOSIS — N179 Acute kidney failure, unspecified: Secondary | ICD-10-CM | POA: Diagnosis not present

## 2020-09-18 DIAGNOSIS — I130 Hypertensive heart and chronic kidney disease with heart failure and stage 1 through stage 4 chronic kidney disease, or unspecified chronic kidney disease: Secondary | ICD-10-CM | POA: Diagnosis not present

## 2020-09-18 DIAGNOSIS — Z20822 Contact with and (suspected) exposure to covid-19: Secondary | ICD-10-CM | POA: Diagnosis not present

## 2020-09-18 DIAGNOSIS — J449 Chronic obstructive pulmonary disease, unspecified: Secondary | ICD-10-CM | POA: Diagnosis not present

## 2020-09-18 HISTORY — PX: BUBBLE STUDY: SHX6837

## 2020-09-18 HISTORY — PX: TEE WITHOUT CARDIOVERSION: SHX5443

## 2020-09-18 LAB — COMPREHENSIVE METABOLIC PANEL
ALT: 20 U/L (ref 0–44)
AST: 29 U/L (ref 15–41)
Albumin: 3.5 g/dL (ref 3.5–5.0)
Alkaline Phosphatase: 75 U/L (ref 38–126)
Anion gap: 7 (ref 5–15)
BUN: 33 mg/dL — ABNORMAL HIGH (ref 6–20)
CO2: 24 mmol/L (ref 22–32)
Calcium: 8.8 mg/dL — ABNORMAL LOW (ref 8.9–10.3)
Chloride: 105 mmol/L (ref 98–111)
Creatinine, Ser: 1.87 mg/dL — ABNORMAL HIGH (ref 0.61–1.24)
GFR, Estimated: 42 mL/min — ABNORMAL LOW (ref >60)
Glucose, Bld: 100 mg/dL — ABNORMAL HIGH (ref 70–99)
Potassium: 3.6 mmol/L (ref 3.5–5.1)
Sodium: 136 mmol/L (ref 135–145)
Total Bilirubin: 0.5 mg/dL (ref 0.3–1.2)
Total Protein: 6.2 g/dL — ABNORMAL LOW (ref 6.5–8.1)

## 2020-09-18 LAB — CBC WITH DIFFERENTIAL/PLATELET
Abs Immature Granulocytes: 0.03 10*3/uL (ref 0.00–0.07)
Basophils Absolute: 0.1 10*3/uL (ref 0.0–0.1)
Basophils Relative: 1 %
Eosinophils Absolute: 0.2 10*3/uL (ref 0.0–0.5)
Eosinophils Relative: 3 %
HCT: 43.1 % (ref 39.0–52.0)
Hemoglobin: 14 g/dL (ref 13.0–17.0)
Immature Granulocytes: 0 %
Lymphocytes Relative: 24 %
Lymphs Abs: 2.2 10*3/uL (ref 0.7–4.0)
MCH: 26.4 pg (ref 26.0–34.0)
MCHC: 32.5 g/dL (ref 30.0–36.0)
MCV: 81.2 fL (ref 80.0–100.0)
Monocytes Absolute: 0.9 10*3/uL (ref 0.1–1.0)
Monocytes Relative: 11 %
Neutro Abs: 5.4 10*3/uL (ref 1.7–7.7)
Neutrophils Relative %: 61 %
Platelets: 212 10*3/uL (ref 150–400)
RBC: 5.31 MIL/uL (ref 4.22–5.81)
RDW: 13.9 % (ref 11.5–15.5)
WBC: 8.8 10*3/uL (ref 4.0–10.5)
nRBC: 0 % (ref 0.0–0.2)

## 2020-09-18 LAB — GLUCOSE, CAPILLARY
Glucose-Capillary: 111 mg/dL — ABNORMAL HIGH (ref 70–99)
Glucose-Capillary: 95 mg/dL (ref 70–99)
Glucose-Capillary: 92 mg/dL (ref 70–99)
Glucose-Capillary: 117 mg/dL — ABNORMAL HIGH (ref 70–99)

## 2020-09-18 LAB — SURGICAL PCR SCREEN
MRSA, PCR: NEGATIVE
Staphylococcus aureus: NEGATIVE

## 2020-09-18 LAB — HEMOGLOBIN A1C
Hgb A1c MFr Bld: 7.4 % — ABNORMAL HIGH (ref 4.8–5.6)
Mean Plasma Glucose: 166 mg/dL

## 2020-09-18 LAB — MAGNESIUM: Magnesium: 2.1 mg/dL (ref 1.7–2.4)

## 2020-09-18 SURGERY — ECHOCARDIOGRAM, TRANSESOPHAGEAL
Anesthesia: Monitor Anesthesia Care

## 2020-09-18 MED ORDER — PROPOFOL 10 MG/ML IV BOLUS
INTRAVENOUS | Status: DC | PRN
  Administered 2020-09-18: 20 mg via INTRAVENOUS
  Administered 2020-09-18 (×2): 30 mg via INTRAVENOUS
  Administered 2020-09-18: 20 mg via INTRAVENOUS

## 2020-09-18 MED ORDER — MUPIROCIN 2 % EX OINT
1.0000 "application " | TOPICAL_OINTMENT | Freq: Two times a day (BID) | CUTANEOUS | Status: DC
  Administered 2020-09-18 – 2020-09-19 (×2): 1 via NASAL
  Filled 2020-09-18: qty 22

## 2020-09-18 MED ORDER — SODIUM CHLORIDE 0.9 % IV SOLN: INTRAVENOUS | Status: DC | PRN

## 2020-09-18 MED ORDER — SODIUM CHLORIDE 0.9 % IV SOLN: INTRAVENOUS | Status: DC

## 2020-09-18 MED ORDER — PROPOFOL 500 MG/50ML IV EMUL
INTRAVENOUS | Status: DC | PRN
  Administered 2020-09-18: 125 ug/kg/min via INTRAVENOUS

## 2020-09-18 NOTE — Consult Note (Addendum)
ELECTROPHYSIOLOGY CONSULT NOTE  Patient ID: Craig Knight MRN: [id number], DOB/AGE: 1965-05-22   Admit date: 09/15/2020 Date of Consult: 09/18/2020  Primary Physician: Rosita Fire, MD Primary Cardiologist: Dr. Debara Pickett (last seen 2018) Reason for Consultation: Cryptogenic stroke ; recommendations regarding Implantable Loop Recorder, requested by Dr. Erlinda Hong  History of Present Illness Craig Knight was transferred from [facility] and admitted on 09/15/2020 with worsening R sided weakness and some SOB , found with new stroke and mild COPD exacerbation  NOTED: He was just discharged 09/08/20 with acute stroke ( with residual mild right side weakness,, left hemianopsia, right facial droop), planned for TEE possible loop though patient wanted to go home rather then stay the weekend and planned for out patient cardiology follow up for this  PMHx includes: recent stroke as noted above AND HTN, DM, smoker (recently quit), CKD (III), COPD  He last saw cardiology team (Dr. Debara Pickett) 2018, w/u noted a stress test in January 2018 which showed a hypertensive response to exercise and no ischemic perfusion defects. LVEF was only 38% planned for coronary CT though doesn't look completed Echo done 2019 LVEF 65-70%   Neurology notes: .recent large R PCA stroke and left pontine stroke last week with residual L hemianopsia and mild right sided weakness and right facial droop with unclear stroke etiology who presented to [facility] ED with shortness of breath and worsening R sided weakness. He had an MRI Brain which demonstrated new acute left basal ganglia stroke and evolving R PCA stroke. He did not receive IV t-PA due to late presentation (>4.5 hours from time of onset).     he has undergone workup for stroke including echocardiogram with his recent stroke only a week or so ago and carotid angios.  The patient has been monitored on telemetry which has demonstrated sinus rhythm with no arrhythmias.  Inpatient stroke  work-up is to be completed with a TEE.   Echocardiogram 09/07/20 demonstrated   IMPRESSIONS   1. Left ventricular ejection fraction, by estimation, is 60 to 65%. The  left ventricle has normal function. The left ventricle has no regional  wall motion abnormalities. There is severe left ventricular hypertrophy.  Left ventricular diastolic parameters   are consistent with Grade I diastolic dysfunction (impaired relaxation).   2. Right ventricular systolic function is normal. The right ventricular  size is normal. Tricuspid regurgitation signal is inadequate for assessing  PA pressure.   3. The mitral valve is normal in structure. No evidence of mitral valve  regurgitation. No evidence of mitral stenosis.   4. The aortic valve has an indeterminant number of cusps. Aortic valve  regurgitation is not visualized. No aortic stenosis is present.    Lab work is reviewed. Creat 2.00 and 1.87 (his baseline) WBC 8.8   Prior to admission, the patient denies chest pain, shortness of breath, dizziness, palpitations, or syncope.  They are recovering from their stroke with plans to CIR at discharge.  The patient reports frustration and worry about his stroke, otherwise denies any CP, SOB, symptoms  Past Medical History:  Diagnosis Date   Asthma    Bronchitis    COPD (chronic obstructive pulmonary disease) ([place])    Diabetes mellitus without complication ([place])    Heart murmur [id number]   soft systolic murmur 1/6   Hypertension    Sleep apnea    Stroke [facility])      Surgical History:  Past Surgical History:  Procedure Laterality Date   COLONOSCOPY N/A 09/18/2016  Procedure: COLONOSCOPY;  Surgeon: Danie Binder, MD;  Location: [facility];  Service: Endoscopy;  Laterality: N/A;  10:30 AM   lipoma removal       Medications Prior to Admission  Medication Sig Dispense Refill Last Dose   albuterol (VENTOLIN HFA) 108 (90 Base) MCG/ACT inhaler Inhale 2 puffs into the lungs every 6 (six) hours  as needed for wheezing or shortness of breath. 18 g 8 09/15/2020   aspirin EC 81 MG tablet Take 1 tablet (81 mg total) by mouth daily. 30 tablet 0 09/15/2020   atorvastatin (LIPITOR) 80 MG tablet Take 1 tablet (80 mg total) by mouth daily. 90 tablet 1 09/15/2020   chlorthalidone (HYGROTON) 25 MG tablet Take 1 tablet (25 mg total) by mouth daily. 30 tablet 0 09/15/2020   [START ON 10/09/2020] clopidogrel (PLAVIX) 75 MG tablet Take 1 tablet (75 mg total) by mouth daily.   09/15/2020 at 0930   Fluticasone-Umeclidin-Vilant (TRELEGY ELLIPTA) 100-62.5-25 MCG/INH AEPB Inhale 1 puff into the lungs daily. 60 each 8 09/15/2020   gabapentin (NEURONTIN) 300 MG capsule Take 300 mg by mouth at bedtime.   09/14/2020   glipiZIDE (GLUCOTROL) 5 MG tablet Take 1 tablet (5 mg total) by mouth daily. 180 tablet 1 09/15/2020   hydrALAZINE (APRESOLINE) 50 MG tablet Take 50 mg by mouth 2 (two) times daily.   09/15/2020   ipratropium-albuterol (DUONEB) 0.5-2.5 (3) MG/3ML SOLN Take 3 mLs by nebulization every 4 (four) hours as needed. 360 mL 6 09/14/2020   isosorbide mononitrate (IMDUR) 60 MG 24 hr tablet Take 1 tablet (60 mg total) by mouth daily. 30 tablet 0 09/15/2020   losartan (COZAAR) 100 MG tablet Take 1 tablet (100 mg total) by mouth daily.   09/15/2020   metFORMIN (GLUCOPHAGE) 500 MG tablet Take 1 tablet (500 mg total) by mouth 2 (two) times daily with a meal. 180 tablet 0 09/15/2020   nitroGLYCERIN (NITROSTAT) 0.4 MG SL tablet Place 1 tablet (0.4 mg total) under the tongue every 5 (five) minutes as needed for chest pain. 30 tablet 0    NON FORMULARY Pt uses cpap nightly   09/14/2020   ticagrelor (BRILINTA) 90 MG TABS tablet Take 1 tablet (90 mg total) by mouth 2 (two) times daily. Start Plavix when you complete this for one month 60 tablet 0 09/15/2020   traZODone (DESYREL) 100 MG tablet Take 100 mg by mouth at bedtime.       Inpatient Medications:   aspirin EC  81 mg Oral Daily   atorvastatin  80 mg Oral Daily   enoxaparin  (LOVENOX) injection  40 mg Subcutaneous Q24H   gabapentin  300 mg Oral QHS   insulin aspart  0-5 Units Subcutaneous QHS   insulin aspart  0-9 Units Subcutaneous TID WC   mupirocin ointment  1 application Nasal BID   ticagrelor  90 mg Oral BID   traZODone  100 mg Oral QHS   umeclidinium-vilanterol  1 puff Inhalation Daily    Allergies:  Allergies  Allergen Reactions   Dust Mite Extract     Sneezing   Lisinopril Cough    Social History   Socioeconomic History   Marital status: Married    Spouse name: Not on file   Number of children: Not on file   Years of education: Not on file   Highest education level: Not on file  Occupational History   Not on file  Tobacco Use   Smoking status: Every Day    Packs/day:  0.25    Types: Cigarettes   Smokeless tobacco: Never  Vaping Use   Vaping Use: Never used  Substance and Sexual Activity   Alcohol use: Yes    Alcohol/week: 0.0 standard drinks    Comment: occasionally   Drug use: No   Sexual activity: Yes    Birth control/protection: None  Other Topics Concern   Not on file  Social History Narrative   Not on file   Social Determinants of Health   Financial Resource Strain: Not on file  Food Insecurity: Not on file  Transportation Needs: Not on file  Physical Activity: Not on file  Stress: Not on file  Social Connections: Not on file  Intimate Partner Violence: Not on file     Family History  Problem Relation Age of Onset   Stroke Mother    Heart attack Father 1       CABG   Hypertension Sister    Stroke Brother    Hypertension Brother    Stomach cancer Brother    Stroke Maternal Grandmother    Heart attack Maternal Grandfather    Heart attack Paternal Grandmother       Review of Systems: All other systems reviewed and are otherwise negative except as noted above.  Physical Exam: Vitals:   09/18/20 0008 09/18/20 0415 09/18/20 0748 09/18/20 0750  BP: (!) 154/98 (!) 164/93 (!) 144/98   Pulse: 65 62 64    Resp: '19 19 14   '$ Temp: 97.7 F (36.5 C) 98.1 F (36.7 C) 97.9 F (36.6 C)   TempSrc: Oral Oral Oral   SpO2: 99% 100% 100% 96%  Weight:      Height:        GEN- The patient is well appearing, alert and oriented x 3 today.   Head- normocephalic, atraumatic Eyes-  Sclera clear, conjunctiva pink Ears- hearing intact Oropharynx- clear Neck- supple Lungs- CTA b/l, normal work of breathing Heart- RRR, no murmurs, rubs or gallops  GI- soft, NT, ND Extremities- no clubbing, cyanosis, or edema MS- no significant deformity or atrophy Skin- no rash or lesion Psych- euthymic mood, full affect   Labs:   Lab Results  Component Value Date   WBC 8.8 09/18/2020   HGB 14.0 09/18/2020   HCT 43.1 09/18/2020   MCV 81.2 09/18/2020   PLT 212 09/18/2020    Recent Labs  Lab 09/18/20 0439  NA 136  K 3.6  CL 105  CO2 24  BUN 33*  CREATININE 1.87*  CALCIUM 8.8*  PROT 6.2*  BILITOT 0.5  ALKPHOS 75  ALT 20  AST 29  GLUCOSE 100*   Lab Results  Component Value Date   CKTOTAL 114 08/02/2011   CKMB 2.4 08/02/2011   TROPONINI 0.04 (HH) 04/16/2018   Lab Results  Component Value Date   CHOL 115 09/16/2020   CHOL 163 09/07/2020   CHOL 145 11/23/2018   Lab Results  Component Value Date   HDL 33 (L) 09/16/2020   HDL 30 (L) 09/07/2020   HDL 27 (L) 11/23/2018   Lab Results  Component Value Date   LDLCALC 67 09/16/2020   LDLCALC 108 (H) 09/07/2020   LDLCALC 99 11/23/2018   Lab Results  Component Value Date   TRIG 77 09/16/2020   TRIG 124 09/07/2020   TRIG 93 11/23/2018   Lab Results  Component Value Date   CHOLHDL 3.5 09/16/2020   CHOLHDL 5.4 09/07/2020   CHOLHDL 5.4 11/23/2018   No results found for: LDLDIRECT  Lab Results  Component Value Date   DDIMER <0.27 06/04/2016     Radiology/Studies:  CT Angio Head W or Wo Contrast Result Date: 09/06/2020 CLINICAL DATA:  Right-sided weakness, left-sided vision impairment. EXAM: CT ANGIOGRAPHY HEAD AND NECK TECHNIQUE:  Multidetector CT imaging of the head and neck was performed using the standard protocol during bolus administration of intravenous contrast. Multiplanar CT image reconstructions and MIPs were obtained to evaluate the vascular anatomy. Carotid stenosis measurements (when applicable) are obtained utilizing NASCET criteria, using the distal internal carotid diameter as the denominator. CONTRAST:  63m OMNIPAQUE IOHEXOL 350 MG/ML SOLN COMPARISON:  CT angiogram of the head and neck November 23, 2018. FINDINGS: CTA NECK FINDINGS Aortic arch: Common origin of the innominate and left common carotid artery from the aortic arch. Imaged portion shows no evidence of aneurysm or dissection. No significant stenosis of the major arch vessel origins. Right carotid system: Increased tortuosity of the cervical segment of the right ICA. No significant stenosis. Left carotid system: Mild atherosclerotic changes of the left carotid bifurcation without hemodynamically significant stenosis. Increased tortuosity of the cervical segment of the left ICA. Vertebral arteries: Codominant. No evidence of dissection, stenosis (50% or greater) or occlusion. Skeleton: No acute osseous abnormality. Periapical lucency at the right maxillary first molar tooth. Other neck: Enlarged thyroid gland with multiple hypodense nodules, the largest measuring up to 1.5 cm. Upper chest: Negative. Review of the MIP images confirms the above findings CTA HEAD FINDINGS Anterior circulation: Mild atherosclerotic changes of the bilateral carotid siphon without stenosis. Luminal irregularity seen along the bilateral MCA vascular trees with focus of moderate stenosis at a left M3/MCA superior division branch and mild stenosis at a right M3/MCA inferior division branch. There is also luminal irregularity in the bilateral ACA vascular trees with focal area of moderate stenosis at the right A3/ACA segment. Fenestration of the left A1/ACA again noted. Posterior  circulation: Atherosclerotic changes of the bilateral intracranial vertebral arteries with moderate stenosis on the left. No significant stenosis on the right. The basilar artery has normal course and caliber. There is an occlusion of the right PCA at the P1-P2 junction with poor distal flow. Atherosclerotic changes of the left posterior cerebral artery with mild-to-moderate stenosis at the P2 segment. Venous sinuses: As permitted by contrast timing, patent. Anatomic variants: Fenestration of the left A1/ACA. Review of the MIP images confirms the above findings IMPRESSION: 1. Positive for large vessel occlusion at the right P1-P2/PCA junction. 2. Intracranial atherosclerotic disease with moderate stenosis at a left M3/MCA superior division branch, mild stenosis at a right M3/MCA inferior division branch, moderate stenosis at the right A3/ACA segment, moderate stenosis at the left vertebral artery and mild-to-moderate stenosis at a left P2/PCA. 3. No significant atherosclerotic disease in the neck. These results were called by telephone at the time of interpretation on 09/06/2020 at 5:19 pm to Dr. XErlinda Hong who verbally acknowledged these results. Electronically Signed   By: KPedro EarlsM.D.   On: 09/06/2020 17:42    CT Head Wo Contrast Result Date: 09/13/2020 CLINICAL DATA:  Right leg weakness EXAM: CT HEAD WITHOUT CONTRAST TECHNIQUE: Contiguous axial images were obtained from the base of the skull through the vertex without intravenous contrast. COMPARISON:  MRI 09/07/2020, CT 09/06/2020 FINDINGS: Brain: Mild residual hypodensity within the right occipital and posterior temporal lobes as well as splenium corpus callosum consistent with evolving right PCA infarct. No definitive hemorrhage seen. Hypodensity in the left pons, also consistent with evolving infarct. Negative for  intracranial mass. Chronic left frontal lobe infarct. Chronic bilateral white matter and basal ganglial infarcts. Chronic  lacunar infarcts in the thalamus. Stable ventricle size. Chronic small vessel ischemic changes of the white matter. Vascular: No hyperdense vessels. Mild carotid vascular calcification. Vertebral artery calcification Skull: Normal. Negative for fracture or focal lesion. Sinuses/Orbits: Mucosal thickening in the sinuses Other: None IMPRESSION: 1. Evolving right PCA infarct without interval hemorrhage. Evolving left pontine infarct without interval hemorrhage. No definite new acute intracranial abnormality since prior study from 09/06/2020. 2. Atrophy with chronic small vessel ischemic changes of the white matter. Multiple chronic infarcts involving left frontal lobe, bilateral white matter, basal ganglia and thalamus. Electronically Signed   By: Donavan Foil M.D.   On: 09/13/2020 20:24    MR ANGIO HEAD WO CONTRAST Result Date: 09/15/2020 CLINICAL DATA:  Stroke EXAM: MRA HEAD WITHOUT CONTRAST TECHNIQUE: Angiographic images of the Circle of Willis were acquired using MRA technique without intravenous contrast. COMPARISON:  MRI head 09/15/2020.  CT a 09/06/2020 FINDINGS: Anterior circulation: Motion degraded study. This significantly limits evaluation for small vessel disease. Internal carotid artery patent bilaterally without significant stenosis. Irregular areas of stenosis in the anterior and middle cerebral artery branches bilaterally consistent with widespread atherosclerotic disease. This is confirmed on the prior CTA. No large vessel occlusion. Posterior circulation: Both vertebral arteries are patent to the basilar. Basilar patent. Superior cerebellar arteries patent bilaterally. Occlusion of the proximal right posterior cerebral artery corresponding to right PCA infarct on MRI. There is severe stenosis of the left posterior cerebral artery which has distal flow. This appears progressive from the prior CT angiogram. Differences could be due to artifact from motion. Anatomic variants: None Other: None  IMPRESSION: Motion degraded study Diffuse intracranial atherosclerotic disease, moderate to severe Occlusion right PCA. Severe stenosis left PCA with distal flow. This may have progressed since prior studies however is difficult to compare given the amount of motion on the MRA. Electronically Signed   By: Franchot Gallo M.D.   On: 09/15/2020 16:32    MR BRAIN WO CONTRAST Result Date: 09/17/2020 CLINICAL DATA:  Stroke follow-up EXAM: MRI HEAD WITHOUT CONTRAST TECHNIQUE: Multiplanar, multiecho pulse sequences of the brain and surrounding structures were obtained without intravenous contrast. COMPARISON:  Two days ago FINDINGS: Brain: Acute left perforator infarct affecting the corona radiata and caudate body. More weakly restricted diffusion in the right PCA territory at the occipital lobe and right posterior corpus callosum. Subacute perforator infarct at the left pons. No interval infarction is seen. No hemorrhagic complication. Remote left frontal operculum infarct. Chronic small vessel ischemia and lacunar infarcts. Vascular: No available T2 weighted imaging Skull and upper cervical spine: Negative Sinuses/Orbits: Negative Other: Progressively motion degraded. Truncated study per clinical request. IMPRESSION: Unchanged acute and subacute infarcts involving the infra and supratentorial brain. Electronically Signed   By: Monte Fantasia M.D.   On: 09/17/2020 06:17     MR BRAIN WO CONTRAST Result Date: 09/15/2020 CLINICAL DATA:  Transient ischemic attack (TIA). Right leg weakness. EXAM: MRI HEAD WITHOUT CONTRAST TECHNIQUE: Multiplanar, multiecho pulse sequences of the brain and surrounding structures were obtained without intravenous contrast. COMPARISON:  Head CT 09/13/2020 and MRI 09/07/2020 FINDINGS: Brain: There is a new acute left lateral lenticulostriate territory infarct extending from the posterior aspect of the left lentiform nucleus to the caudate body and involving the intervening deep white  matter. There is residual diffusion weighted signal abnormality at the sites of the posterior circulation infarcts on the prior MRI (right temporal  and occipital lobes, splenium of the corpus callosum, and left pons) without evidence of significant interval infarct extension. There is minimal petechial hemorrhage in the right occipital lobe. A chronic microhemorrhage is noted in the anterior right frontal lobe. Chronic lacunar infarcts are again noted involving the deep cerebral white matter bilaterally, basal ganglia, and thalami, and there is a chronic moderate-sized left MCA infarct near the anterior aspect of the left frontal operculum. T2 hyperintensities elsewhere in the cerebral white matter bilaterally are unchanged and nonspecific but compatible with moderate chronic small vessel ischemic disease. No mass, midline shift, or extra-axial fluid collection is identified. There is mild cerebral atrophy. Vascular: Major intracranial vascular flow voids are preserved. Skull and upper cervical spine: Unremarkable bone marrow signal. Sinuses/Orbits: Unremarkable orbits. Minimal mucosal thickening or small mucous retention cyst in left sphenoid sinus. Small right and moderate left mastoid effusions. Other: None. IMPRESSION: 1. Acute left basal ganglia infarct. 2. Evolving subacute posterior circulation infarcts without extension from the 09/07/2020 MRI. 3. 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 Electronically Signed   By: Logan Bores M.D.   On: 09/15/2020 14:51     CT ANGIO HEAD NECK W WO CM W PERF Result Date: 09/16/2020 CLINICAL DATA:  Stroke follow-up.  Right arm numbness. EXAM: CT ANGIOGRAPHY HEAD AND NECK CT PERFUSION BRAIN TECHNIQUE: Multidetector CT imaging of the head and neck was performed using the standard protocol during bolus administration of intravenous contrast. Multiplanar CT image reconstructions and MIPs were obtained to evaluate the vascular anatomy.  Carotid stenosis measurements (when applicable) are obtained utilizing NASCET criteria, using the distal internal carotid diameter as the denominator. Multiphase CT imaging of the brain was performed following IV bolus contrast injection. Subsequent parametric perfusion maps were calculated using RAPID software. CONTRAST:  148m OMNIPAQUE IOHEXOL 350 MG/ML SOLN COMPARISON:  Head MRI and MRA 09/15/2020. Head and neck CTA 09/06/2020. FINDINGS: CT HEAD FINDINGS Brain: Hypodensity in the posterior left basal ganglia/posterior limb of internal capsule/corona radiata corresponds to the acute infarct which is better demonstrated on yesterday's MRI. No acute cortically based infarct, mass, midline shift, or extra-axial fluid collection is identified. Multiple subacute posterior circulation infarcts are again noted involving the posteromedial right temporal and occipital lobes, splenium of the corpus callosum, and left pons with minimal petechial hemorrhage again noted in the right occipital lobe. There is a chronic moderate-sized left frontal lobe MCA infarct, and there are moderate chronic small-vessel changes in the cerebral white matter with chronic lacunar infarcts in the deep cerebral white matter bilaterally, basal ganglia, and thalami. There is mild cerebral atrophy. Vascular: No hyperdense vessel. Skull: No fracture or suspicious osseous lesion. Sinuses/Orbits: Small mucous retention cyst in the left sphenoid sinus. Small to moderate mastoid effusions. Unremarkable orbits. Other: None. CTA NECK FINDINGS Aortic arch: Normal variant aortic arch branching pattern with common origin of the brachiocephalic and left common carotid arteries. Widely patent arch vessel origins. Right carotid system: Patent without evidence of stenosis, dissection, or significant atherosclerosis. Tortuous cervical ICA. Left carotid system: Patent with a small amount of calcified plaque at the carotid bifurcation. No evidence of a significant  stenosis or dissection. Tortuous cervical ICA. Vertebral arteries: Patent and codominant without evidence of a significant stenosis or dissection. Skeleton: Mild cervical spondylosis. Other neck: Bilateral thyroid nodules measuring up to 1.5 cm. No evidence of cervical lymphadenopathy. Upper chest: Clear lung apices. Review of the MIP images confirms the above findings CTA HEAD FINDINGS Anterior circulation: The internal carotid arteries  are patent from skull base to carotid termini with mild atherosclerosis not resulting in a significant stenosis. ACAs and MCAs are patent without evidence of a proximal branch occlusion. There is widespread atherosclerotic type irregularity including an unchanged mild proximal left M1 stenosis. Severe left A3 stenoses appear increased from the prior CTA. No aneurysm is identified. Posterior circulation: The intracranial vertebral arteries are patent to the basilar with atherosclerotic plaque resulting in an unchanged moderate stenosis on the left. The a severe proximal right AICA stenosis is again seen. Patent SCA is are identified bilaterally. The basilar artery is widely patent. There is a small left posterior communicating artery. There is unchanged occlusion of the right PCA near the P1-P2 junction. The left PCA is patent but diffusely irregular with a severe stenosis in the proximal P2 segment which appears increased from the prior CTA but less extensive than what was suggested on the more recent MRA. No aneurysm is identified. Venous sinuses: Hypoplastic and poorly visualized right transverse and sigmoid sinuses, unchanged. Other major dural venous sinuses are patent. Anatomic variants: Left A1 fenestration. Review of the MIP images confirms the above findings CT Brain Perfusion Findings: CBF (<30%) Volume: 0 mL Perfusion (Tmax>6.0s) volume: 45 mL Mismatch Volume: 45 mL Infarction Location: No acute core infarct is identified by automated processing. The region of delayed  perfusion corresponds to the subacute right PCA infarct. IMPRESSION: CT HEAD: 1. Known acute left basal ganglia infarct and multiple subacute posterior circulation infarcts as shown on yesterday's MRI. 2. No definite new infarct or intracranial hemorrhage. 3. Chronic ischemia with multiple old infarcts as above. CTA HEAD AND NECK: 1. No emergent large vessel occlusion. 2. Unchanged right PCA occlusion. 3. Advanced intracranial atherosclerosis including severe proximal left PCA and left A3 stenoses which appear increased from the prior CTA. 4. Widely patent cervical carotid arteries. 5. Thyroid nodules measuring up to 1.5 cm. Recommend thyroid US (ref: J Am Coll Radiol. 2015 Feb;12(2): 143-50). CT PERFUSION: No evidence of acute core infarct or penumbra identified by automated processing. Electronically Signed   By: Logan Bores M.D.   On: 09/16/2020 10:32    12-lead ECG SR All prior EKG's in EPIC reviewed with no documented atrial fibrillation  Telemetry SR, one very brief PAT  Assessment and Plan:  1. Cryptogenic stroke The patient presents with cryptogenic stroke.  The patient has a TEE planned for this today.  I spoke at length with the patient about monitoring for afib with either a 30 day event monitor or an implantable loop recorder.  Risks, benefits, and alteratives to implantable loop recorder were discussed with the patient today.   At this time, the patient is very clear in his decision to avoid ILR  In our initial discussion yesterday about possible loop placement the patient was initially agreeable, though in re-discussion today he reports that he did not grasp/understand that the monitor was 4-5 year monitoring and does NOT want to pursue a loop.  He is agreeable to a Zio 2 week monitor at time of discharge though    Baldwin Jamaica, PA-C 09/18/2020       I have seen, examined the patient, and reviewed the above assessment and plan.  Changes to above are made where necessary.  On  exam, RRR.  The patient has profound R sided deficits from stroke with some expressive aphasia. He and I spoke at length about monitoring for AF as the cause of his stroke.  He is very clear that he prefers  30 day monitor rather than ILR.  Given frequent recurrent strokes, we may find our answer on 30 day monitor.  If not, he is aware that we could proceed with ILR at that time if he would like.  We will arrange outpatient EP follow-up.  Electrophysiology team to see as needed while here. Please call with questions.   Co Sign: Thompson Grayer, MD 09/19/2020 10:31 AM

## 2020-09-18 NOTE — Progress Notes (Signed)
STROKE TEAM PROGRESS NOTE   INTERVAL HISTORY Wife and daughter are at bedside, patient lying in bed, upset, right upper and lower extremity flaccid today.  TTE unremarkable, no PFO.  Loop recorder will be placed before discharge.  Encourage patient work with PT/OT.  OBJECTIVE Vitals:   09/18/20 1237 09/18/20 1347 09/18/20 1354 09/18/20 1525  BP: (!) 177/80 122/64 (!) (P) 144/66 (!) (P) 157/84  Pulse: 61 78 87 (P) 62  Resp: '14 16 20 '$ (P) 20  Temp: 97.9 F (36.6 C) (!) 97.3 F (36.3 C)  (P) 97.8 F (36.6 C)  TempSrc: Temporal   (P) Oral  SpO2: 99% 95% 98% (P) 99%  Weight: 102.1 kg     Height: '5\' 7"'$  (1.702 m)       CBC:  Recent Labs  Lab 09/17/20 0211 09/18/20 0439  WBC 12.3* 8.8  NEUTROABS 8.7* 5.4  HGB 14.7 14.0  HCT 44.3 43.1  MCV 79.7* 81.2  PLT 243 [id number]    Basic Metabolic Panel:  Recent Labs  Lab 09/17/20 0211 09/18/20 0439  NA 137 136  K 3.9 3.6  CL 104 105  CO2 23 24  GLUCOSE 99 100*  BUN 35* 33*  CREATININE 2.00* 1.87*  CALCIUM 9.2 8.8*  MG 2.2 2.1    Lipid Panel:     Component Value Date/Time   CHOL 115 09/16/2020 0406   TRIG 77 09/16/2020 0406   HDL 33 (L) 09/16/2020 0406   CHOLHDL 3.5 09/16/2020 0406   VLDL 15 09/16/2020 0406   LDLCALC 67 09/16/2020 0406   HgbA1c:  Lab Results  Component Value Date   HGBA1C 7.4 (H) 09/16/2020   Urine Drug Screen:     Component Value Date/Time   LABOPIA NONE DETECTED 09/07/2020 1803   COCAINSCRNUR NONE DETECTED 09/07/2020 1803   LABBENZ NONE DETECTED 09/07/2020 1803   AMPHETMU NONE DETECTED 09/07/2020 1803   THCU NONE DETECTED 09/07/2020 1803   LABBARB NONE DETECTED 09/07/2020 1803    Alcohol Level     Component Value Date/Time   ETH <10 09/06/2020 1630    IMAGING   MR ANGIO HEAD WO CONTRAST  Result Date: 09/15/2020 CLINICAL DATA:  Stroke EXAM: MRA HEAD WITHOUT CONTRAST TECHNIQUE: Angiographic images of the Circle of Willis were acquired using MRA technique without intravenous contrast.  COMPARISON:  MRI head 09/15/2020.  CT a 09/06/2020 FINDINGS: Anterior circulation: Motion degraded study. This significantly limits evaluation for small vessel disease. Internal carotid artery patent bilaterally without significant stenosis. Irregular areas of stenosis in the anterior and middle cerebral artery branches bilaterally consistent with widespread atherosclerotic disease. This is confirmed on the prior CTA. No large vessel occlusion. Posterior circulation: Both vertebral arteries are patent to the basilar. Basilar patent. Superior cerebellar arteries patent bilaterally. Occlusion of the proximal right posterior cerebral artery corresponding to right PCA infarct on MRI. There is severe stenosis of the left posterior cerebral artery which has distal flow. This appears progressive from the prior CT angiogram. Differences could be due to artifact from motion. Anatomic variants: None Other: None IMPRESSION: Motion degraded study Diffuse intracranial atherosclerotic disease, moderate to severe Occlusion right PCA. Severe stenosis left PCA with distal flow. This may have progressed since prior studies however is difficult to compare given the amount of motion on the MRA. Electronically Signed   By: Franchot Gallo M.D.   On: 09/15/2020 16:32   MR BRAIN WO CONTRAST  Result Date: 09/15/2020 CLINICAL DATA:  Transient ischemic attack (TIA). Right leg weakness. EXAM: MRI  HEAD WITHOUT CONTRAST TECHNIQUE: Multiplanar, multiecho pulse sequences of the brain and surrounding structures were obtained without intravenous contrast. COMPARISON:  Head CT 09/13/2020 and MRI 09/07/2020 FINDINGS: Brain: There is a new acute left lateral lenticulostriate territory infarct extending from the posterior aspect of the left lentiform nucleus to the caudate body and involving the intervening deep white matter. There is residual diffusion weighted signal abnormality at the sites of the posterior circulation infarcts on the prior MRI  (right temporal and occipital lobes, splenium of the corpus callosum, and left pons) without evidence of significant interval infarct extension. There is minimal petechial hemorrhage in the right occipital lobe. A chronic microhemorrhage is noted in the anterior right frontal lobe. Chronic lacunar infarcts are again noted involving the deep cerebral white matter bilaterally, basal ganglia, and thalami, and there is a chronic moderate-sized left MCA infarct near the anterior aspect of the left frontal operculum. T2 hyperintensities elsewhere in the cerebral white matter bilaterally are unchanged and nonspecific but compatible with moderate chronic small vessel ischemic disease. No mass, midline shift, or extra-axial fluid collection is identified. There is mild cerebral atrophy. Vascular: Major intracranial vascular flow voids are preserved. Skull and upper cervical spine: Unremarkable bone marrow signal. Sinuses/Orbits: Unremarkable orbits. Minimal mucosal thickening or small mucous retention cyst in left sphenoid sinus. Small right and moderate left mastoid effusions. Other: None. IMPRESSION: 1. Acute left basal ganglia infarct. 2. Evolving subacute posterior circulation infarcts without extension from the 09/07/2020 MRI. 3. 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 Electronically Signed   By: Logan Bores M.D.   On: 09/15/2020 14:51    CT ANGIO HEAD NECK W WO CM W PERF  Result Date: 09/16/2020 CLINICAL DATA:  Stroke follow-up.  Right arm numbness. EXAM: CT ANGIOGRAPHY HEAD AND NECK CT PERFUSION BRAIN TECHNIQUE: Multidetector CT imaging of the head and neck was performed using the standard protocol during bolus administration of intravenous contrast. Multiplanar CT image reconstructions and MIPs were obtained to evaluate the vascular anatomy. Carotid stenosis measurements (when applicable) are obtained utilizing NASCET criteria, using the distal internal carotid diameter as the  denominator. Multiphase CT imaging of the brain was performed following IV bolus contrast injection. Subsequent parametric perfusion maps were calculated using RAPID software. CONTRAST:  135m OMNIPAQUE IOHEXOL 350 MG/ML SOLN COMPARISON:  Head MRI and MRA 09/15/2020. Head and neck CTA 09/06/2020. FINDINGS: CT HEAD FINDINGS Brain: Hypodensity in the posterior left basal ganglia/posterior limb of internal capsule/corona radiata corresponds to the acute infarct which is better demonstrated on yesterday's MRI. No acute cortically based infarct, mass, midline shift, or extra-axial fluid collection is identified. Multiple subacute posterior circulation infarcts are again noted involving the posteromedial right temporal and occipital lobes, splenium of the corpus callosum, and left pons with minimal petechial hemorrhage again noted in the right occipital lobe. There is a chronic moderate-sized left frontal lobe MCA infarct, and there are moderate chronic small-vessel changes in the cerebral white matter with chronic lacunar infarcts in the deep cerebral white matter bilaterally, basal ganglia, and thalami. There is mild cerebral atrophy. Vascular: No hyperdense vessel. Skull: No fracture or suspicious osseous lesion. Sinuses/Orbits: Small mucous retention cyst in the left sphenoid sinus. Small to moderate mastoid effusions. Unremarkable orbits. Other: None. CTA NECK FINDINGS Aortic arch: Normal variant aortic arch branching pattern with common origin of the brachiocephalic and left common carotid arteries. Widely patent arch vessel origins. Right carotid system: Patent without evidence of stenosis, dissection, or significant atherosclerosis. Tortuous cervical ICA.  Left carotid system: Patent with a small amount of calcified plaque at the carotid bifurcation. No evidence of a significant stenosis or dissection. Tortuous cervical ICA. Vertebral arteries: Patent and codominant without evidence of a significant stenosis or  dissection. Skeleton: Mild cervical spondylosis. Other neck: Bilateral thyroid nodules measuring up to 1.5 cm. No evidence of cervical lymphadenopathy. Upper chest: Clear lung apices. Review of the MIP images confirms the above findings CTA HEAD FINDINGS Anterior circulation: The internal carotid arteries are patent from skull base to carotid termini with mild atherosclerosis not resulting in a significant stenosis. ACAs and MCAs are patent without evidence of a proximal branch occlusion. There is widespread atherosclerotic type irregularity including an unchanged mild proximal left M1 stenosis. Severe left A3 stenoses appear increased from the prior CTA. No aneurysm is identified. Posterior circulation: The intracranial vertebral arteries are patent to the basilar with atherosclerotic plaque resulting in an unchanged moderate stenosis on the left. The a severe proximal right AICA stenosis is again seen. Patent SCA is are identified bilaterally. The basilar artery is widely patent. There is a small left posterior communicating artery. There is unchanged occlusion of the right PCA near the P1-P2 junction. The left PCA is patent but diffusely irregular with a severe stenosis in the proximal P2 segment which appears increased from the prior CTA but less extensive than what was suggested on the more recent MRA. No aneurysm is identified. Venous sinuses: Hypoplastic and poorly visualized right transverse and sigmoid sinuses, unchanged. Other major dural venous sinuses are patent. Anatomic variants: Left A1 fenestration. Review of the MIP images confirms the above findings CT Brain Perfusion Findings: CBF (<30%) Volume: 0 mL Perfusion (Tmax>6.0s) volume: 45 mL Mismatch Volume: 45 mL Infarction Location: No acute core infarct is identified by automated processing. The region of delayed perfusion corresponds to the subacute right PCA infarct. IMPRESSION: CT HEAD: 1. Known acute left basal ganglia infarct and multiple  subacute posterior circulation infarcts as shown on yesterday's MRI. 2. No definite new infarct or intracranial hemorrhage. 3. Chronic ischemia with multiple old infarcts as above. CTA HEAD AND NECK: 1. No emergent large vessel occlusion. 2. Unchanged right PCA occlusion. 3. Advanced intracranial atherosclerosis including severe proximal left PCA and left A3 stenoses which appear increased from the prior CTA. 4. Widely patent cervical carotid arteries. 5. Thyroid nodules measuring up to 1.5 cm. Recommend thyroid US (ref: J Am Coll Radiol. 2015 Feb;12(2): 143-50). CT PERFUSION: No evidence of acute core infarct or penumbra identified by automated processing. Electronically Signed   By: Logan Bores M.D.   On: 09/16/2020 10:32    ECG - SR rate 96 BPM. (See cardiology reading for complete details)   PHYSICAL EXAM  Temp:  [97.3 F (36.3 C)-98.9 F (37.2 C)] (P) 97.8 F (36.6 C) (07/25 1525) Pulse Rate:  [61-87] (P) 62 (07/25 1525) Resp:  [14-20] (P) 20 (07/25 1525) BP: (122-177)/(64-98) (P) 157/84 (07/25 1525) SpO2:  [95 %-100 %] (P) 99 % (07/25 1525) Weight:  [102.1 kg] 102.1 kg (07/25 1237)  General - Well nourished, well developed, in no apparent distress.  Ophthalmologic - fundi not visualized due to noncooperation.  Cardiovascular - Regular rhythm and rate.  Mental Status -  Level of arousal and orientation to time, place, and person were intact. Language including expression, naming, repetition, comprehension was assessed and found intact.  Cranial Nerves II - XII - II - left hemianopia III, IV, VI - Extraocular movements intact. V - Facial sensation intact bilaterally. VII -  right facial droop VIII - Hearing & vestibular intact bilaterally. X - Palate elevates symmetrically. XI - Chin turning & shoulder shrug intact bilaterally. XII - Tongue protrusion intact.  Motor Strength - The patient's strength was normal in left upper and lower extremities, however, right UE and LE  flaccid today.  Bulk was normal and fasciculations were absent.   Motor Tone - Muscle tone was assessed at the neck and appendages and was normal.  Reflexes - The patient's reflexes were symmetrical in all extremities and he had no pathological reflexes.  Sensory - Light touch, temperature/pinprick were assessed and were symmetrical.    Coordination - The patient had normal movements in left hand with no ataxia or dysmetria.  Tremor was absent.  Gait and Station - deferred.     ASSESSMENT/PLAN Mr. DONTREAL TAUTE is a 55 y.o. male with history of DM2, COPD, ongoing tobacco use, HTN, OSA, recent large R PCA stroke and left pontine stroke last week with residual L hemianopsia and mild right sided weakness and right facial droop with unclear stroke etiology who presented to [facility] ED with shortness of breath and worsening R sided weakness. He had an MRI Brain which demonstrated new acute left basal ganglia stroke and evolving R PCA stroke. He did not receive IV t-PA due to late presentation (>4.5 hours from time of onset).  Stroke with capsular warning syndrome - acute left CR stroke, source unclear.  CT head - Evolving right PCA infarct without interval hemorrhage. Evolving left pontine infarct without interval hemorrhage. No definite new acute intracranial abnormality since prior study from 09/06/2020. Multiple chronic infarcts involving left frontal lobe, bilateral white matter, basal ganglia and thalamus. MRI head 09/15/20 - Acute left CR infarct. Evolving subacute posterior circulation infarcts without extension from the 09/07/2020 MRI. Multiple old infarcts. MRI Head 09/17/20 - Unchanged acute and subacute infarcts involving the infra and supratentorial brain. MRA head - Moderate to severe Occlusion right PCA. Severe stenosis left PCA with distal flow. CTA H&N - unchanged left PCA occlusion, underwent intracranial stenosis with severe proximal left PCA and left A3 stenosis CT Perfusion no  new perfusion deficit TEE with normal EF and negative PFO Loop recorder will be placed prior to discharge LDL - 67 HgbA1c 7.4 UDS - negative VTE prophylaxis - Lovenox 40 mg daily Aspirin 81 mg daily and Brilinta daily prior to admission, now on aspirin 81 mg daily and Brilinta (ticagrelor) 90 mg bid for 30 days and then back to aspirin and Plavix DAPT. Patient will be counseled to be compliant with his antithrombotic medications Ongoing aggressive stroke risk factor management Therapy recommendations:  CIR Disposition:  Pending  CHF Myoview 2018 showed EF 38% However TTE showed EF normal range Has been following with cardiology, on Imdur TEE EF 60 to 65% Loop recorder planned prior to discharge   History of stroke 09/06/2020 admitted for right-sided weakness, slurred speech and right facial droop.  Exam found also left hemianopia.  CT showed subacute left PCA infarct.  CTA head and neck right P1/P2 occlusion, bilateral M3, right A3, left VA and left P2 moderate stenosis.  MRI showed right PCA large infarct with left pontine infarct.  EF 60 to 65%.  LDL 108, A1c 7.6.  Etiology concerning for cardioembolic source, recommend outpatient TEE and loop recorder since patient not willing to stay over the weekend.  Put on aspirin 81 and Brilinta for 30 days on discharge and then back to aspirin 81 and Plavix 75 home dose.  Diabetes HgbA1c 7.4, goal < 7.0 Uncontrolled CBG monitoring SSI DM education and close PCP follow up   Hypertension Stable Avoid low BP On IVF  Close monitoring   Hyperlipidemia Home meds: Lipitor 80 LDL 67 goal < 70 Continue on Lipitor 80 Continue statin at discharge   CKD CKD - stage 3b - Cre - 1.81->2.41->2.40- 1.47->2.35->2.01->2.00-> 1.87 Currently getting NS IV at 75 cc / hr  Tobacco abuse Recently quit smoking Smoking cessation counseling again provided Pt is willing to continue to quit  Other Stroke Risk Factors ETOH use, advised to drink no more  than 1 alcoholic beverage per day. Obesity, Body mass index is 35.25 kg/m., recommend weight loss, diet and exercise as appropriate  Family hx stroke (mother; brother; and grandmother)  Obstructive sleep apnea  Other Active Problems Leukocytosis - [facility] 14.4->12.8->12.3->8.8   Hospital day # 3  Neurology will sign off. Please call with questions. Pt will follow up with stroke clinic NP at [facility] on 10/12/20. Thanks for the consult.   Rosalin Hawking, MD PhD Stroke Neurology 09/18/2020 5:14 PM    To contact Stroke Continuity provider, please refer to [email or web address] After hours, contact General Neurology

## 2020-09-18 NOTE — Plan of Care (Signed)
  Problem: Education: Goal: Knowledge of disease or condition will improve Outcome: Progressing Goal: Knowledge of secondary prevention will improve Outcome: Progressing Goal: Knowledge of patient specific risk factors addressed and post discharge goals established will improve Outcome: Progressing   Problem: Education: Goal: Knowledge of General Education information will improve Description: Including pain rating scale, medication(s)/side effects and non-pharmacologic comfort measures Outcome: Progressing   Problem: Health Behavior/Discharge Planning: Goal: Ability to manage health-related needs will improve Outcome: Progressing   Problem: Clinical Measurements: Goal: Ability to maintain clinical measurements within normal limits will improve Outcome: Progressing Goal: Will remain free from infection Outcome: Progressing Goal: Diagnostic test results will improve Outcome: Progressing Goal: Respiratory complications will improve Outcome: Progressing Goal: Cardiovascular complication will be avoided Outcome: Progressing   Problem: Activity: Goal: Risk for activity intolerance will decrease Outcome: Progressing   Problem: Nutrition: Goal: Adequate nutrition will be maintained Outcome: Progressing   Problem: Coping: Goal: Level of anxiety will decrease Outcome: Progressing   Problem: Elimination: Goal: Will not experience complications related to bowel motility Outcome: Progressing Goal: Will not experience complications related to urinary retention Outcome: Progressing   Problem: Pain Managment: Goal: General experience of comfort will improve Outcome: Progressing   Problem: Safety: Goal: Ability to remain free from injury will improve Outcome: Progressing   Problem: Skin Integrity: Goal: Risk for impaired skin integrity will decrease Outcome: Progressing

## 2020-09-18 NOTE — Progress Notes (Signed)
Inpatient Rehab Admissions Coordinator:  Saw pt at bedside. Wife and daughter also present. Informed them that [facility] has started insurance authorization process. Will continue to follow.    Gayland Curry, [place], [place] Admissions Coordinator [phone]

## 2020-09-18 NOTE — CV Procedure (Signed)
TRANSESOPHAGEAL ECHOCARDIOGRAM (TEE) NOTE  INDICATIONS: Cryptogenic stroke  PROCEDURE:   Informed consent was obtained prior to the procedure. The risks, benefits and alternatives for the procedure were discussed and the patient comprehended these risks.  Risks include, but are not limited to, cough, sore throat, vomiting, nausea, somnolence, esophageal and stomach trauma or perforation, bleeding, low blood pressure, aspiration, pneumonia, infection, trauma to the teeth and death.    After a procedural time-out, the patient was given propofol per anesthesia for sedation.  The patient's heart rate, blood pressure, and oxygen saturation are monitored continuously during the procedure.The oropharynx was anesthetized with 2 sprays of  topical cetacaine.  The transesophageal probe was inserted in the esophagus and stomach without difficulty and multiple views were obtained.  The patient was kept under observation until the patient left the procedure room.  I was present face-to-face 100% of this time. The patient left the procedure room in stable condition.   Agitated microbubble saline contrast was administered.  COMPLICATIONS:    There were no immediate complications.  Findings:  LEFT VENTRICLE: The left ventricular wall thickness is severely increased.  The left ventricular cavity is normal in size. Wall motion is normal.  LVEF is 60-65%.  RIGHT VENTRICLE:  The right ventricle is normal in structure and function without any thrombus or masses.    LEFT ATRIUM:  The left atrium is normal in size without any thrombus or masses.  There is not spontaneous echo contrast ("smoke") in the left atrium consistent with a low flow state.  LEFT ATRIAL APPENDAGE:  The small left atrial appendage is free of any thrombus or masses. The appendage has single lobes. Pulse doppler indicates moderate flow in the appendage.  ATRIAL SEPTUM:  The atrial septum appears intact and is free of thrombus and/or  masses.  There is no evidence for interatrial shunting by color doppler and saline microbubble.  RIGHT ATRIUM:  The right atrium is normal in size and function without any thrombus or masses.  MITRAL VALVE:  The mitral valve is normal in structure and function with  trivial  regurgitation.  There were no vegetations or stenosis.  AORTIC VALVE:  The aortic valve is trileaflet, normal in structure and function with Mild central regurgitation.  There were no vegetations or stenosis  TRICUSPID VALVE:  The tricuspid valve is normal in structure and function with  trivial  regurgitation.  There were no vegetations or stenosis   PULMONIC VALVE:  The pulmonic valve is normal in structure and function with  trivial  regurgitation.  There were no vegetations or stenosis.   AORTIC ARCH, ASCENDING AND DESCENDING AORTA:  There was no Ron Parker et. Al, 1992) atherosclerosis of the ascending aorta, aortic arch, or proximal descending aorta.  12. PULMONARY VEINS: Anomalous pulmonary venous return was not noted.  13. PERICARDIUM: The pericardium appeared normal and non-thickened.  There is no pericardial effusion.  IMPRESSION:   No intracardiac source of embolism found No LAA thrombus Negative for PFO Mild AI Severe LVH LVEF 60-65%  RECOMMENDATIONS:     Consider proceeding with implanted loop recorder for cryptogenic stroke.  Time Spent Directly with the Patient:  45 minutes   Pixie Casino, MD, [facility], [place] Director of the Advanced Lipid Disorders &  Cardiovascular Risk Reduction Clinic Diplomate of the American Board of Clinical Lipidology Attending Cardiologist  Direct Dial: [phone]  Fax: [phone]  Website:  [email or web address]  Nadean Corwin Januel Doolan 09/18/2020, 1:38 PM

## 2020-09-18 NOTE — Progress Notes (Addendum)
PROGRESS NOTE                                                                                                                                                                                                             Patient Demographics:    Craig Knight, is a 55 y.o. male, DOB - September 12, 1965, [id number]  Outpatient Primary MD for the patient is Rosita Fire, MD    LOS - 3  Admit date - 09/15/2020    Chief Complaint  Patient presents with   Shortness of Breath       Brief Narrative (HPI from H&P)  Craig Knight  is a 55 y.o. male, HTN, DM-2, diastolic CHF, [id number], COPD ,large R PCA stroke and left pontine stroke last week with residual L hemianopsia and mild right sided weakness and right facial droop, he was already on aspirin Plavix and statin and presented to [facility] with worsening right-sided weakness and facial droop, also mild COPD exacerbation.  He was found to have a new stroke and was transferred to [facility] for further treatment.   Subjective:   .Patient in bed, appears comfortable, denies any headache, no fever, no chest pain or pressure, no shortness of breath , no abdominal pain. No new focal weakness. +ve R sided weakness and facial droop.   Assessment  & Plan :     Dense right-sided hemiparesis due to acute left basal ganglia infarct, evolving subacute posterior circulation infarcts without extension from the 09/07/2020 MRI. Moderate chronic small vessel ischemic disease with multiple old infarcts - in a patient with recent right PCA occlusion and stroke who was already on aspirin Plavix and statin.  He is now on aspirin Brilinta and statin, neurology following, unfortunately not a candidate for tPA or interventional neurology intervention.  Pete MRI on 09/17/2020 unchanged although his weakness on the right side has worsened.  He is on maximum treatment, continue PT OT May benefit from  rehab placement.  Strictly counseled to quit smoking. Loop per Neuro.   2. Incidental Thyroid nodules measuring up to 1.5 cm. - outpt PCP directed Thyroid US   3.  Ongoing  smoking.  Strictly counseled to quit.  He has been abstaining for the last 3 weeks.  4.  COPD.  Stable supportive care.  5.  Hypertension.  Permissive hypertension due to stroke.  6.  AKI on CKD stage IIIa.  Baseline creatinine around 2, improving with hydration.  7.  OSA - CPAP QHS  8. DM type II.  On sliding scale  Lab Results  Component Value Date   HGBA1C 7.6 (H) 09/07/2020   .Marland KitchenCBG (last 3)  Recent Labs    09/17/20 1530 09/17/20 2118 09/18/20 0653  GLUCAP 106* 115* 111*    Lab Results  Component Value Date   CHOL 115 09/16/2020   HDL 33 (L) 09/16/2020   LDLCALC 67 09/16/2020   TRIG 77 09/16/2020   CHOLHDL 3.5 09/16/2020         Condition - Extremely Guarded  Family Communication  :  Wife [name] on 09/16/20, [phone] on 09/18/20  Code Status :  Full  Consults  :  Neuro  PUD Prophylaxis :    Procedures  :     MRI - 1. Acute left basal ganglia infarct. 2. Evolving subacute posterior circulation infarcts without extension from the 09/07/2020 MRI. 3. 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  Repeat MRI 09/18/2020.  Unchanged.  CTA - 1. No emergent large vessel occlusion. 2. Unchanged right PCA occlusion. 3. Advanced intracranial atherosclerosis including severe proximal left PCA and left A3 stenoses which appear increased from the prior CTA. 4. Widely patent cervical carotid arteries. 5. Thyroid nodules measuring up to 1.5 cm. Recommend thyroid US (ref: J Am Coll Radiol. 2015 Feb;12(2): 143-50). CT PERFUSION: No evidence of acute core infarct or penumbra identified by automated processing.  TTE 2 weeks ago - 1. Left ventricular ejection fraction, by estimation, is 60 to 65%. The left ventricle has normal function. The left ventricle has no regional wall  motion abnormalities. There is severe left ventricular hypertrophy. Left ventricular diastolic parameters  are consistent with Grade I diastolic dysfunction (impaired relaxation).  2. Right ventricular systolic function is normal. The right ventricular size is normal. Tricuspid regurgitation signal is inadequate for assessing PA pressure.  3. The mitral valve is normal in structure. No evidence of mitral valve regurgitation. No evidence of mitral stenosis.  4. The aortic valve has an indeterminant number of cusps. Aortic valve regurgitation is not visualized. No aortic stenosis is present.      Disposition Plan  :    Status is: Inpatient  Remains inpatient appropriate because:IV treatments appropriate due to intensity of illness or inability to take PO  Dispo: The patient is from: Home              Anticipated d/c is to: Home              Patient currently is not medically stable to d/c.   Difficult to place patient No  DVT Prophylaxis  :    enoxaparin (LOVENOX) injection 40 mg Start: 09/16/20 1000 SCD's Start: 09/15/20 2239    Lab Results  Component Value Date   PLT 212 09/18/2020    Diet :  Diet Order             Diet NPO time specified  Diet effective now                    Inpatient Medications  Scheduled Meds:  aspirin EC  81 mg Oral Daily   atorvastatin  80 mg  Oral Daily   enoxaparin (LOVENOX) injection  40 mg Subcutaneous Q24H   gabapentin  300 mg Oral QHS   insulin aspart  0-5 Units Subcutaneous QHS   insulin aspart  0-9 Units Subcutaneous TID WC   mupirocin ointment  1 application Nasal BID   ticagrelor  90 mg Oral BID   traZODone  100 mg Oral QHS   umeclidinium-vilanterol  1 puff Inhalation Daily   Continuous Infusions:  sodium chloride 75 mL/hr at 09/16/20 1405   sodium chloride 20 mL/hr at 09/18/20 1018   PRN Meds:.acetaminophen **OR** acetaminophen (TYLENOL) oral liquid 160 mg/5 mL **OR** acetaminophen, ipratropium-albuterol  Antibiotics  :     Anti-infectives (From admission, onward)    None        Time Spent in minutes  30   Lala Lund M.D on 09/18/2020 at 10:26 AM  To page go to [email or web address]   [facility] -  Office  [phone]   See all Orders from today for further details    Objective:   Vitals:   09/18/20 0008 09/18/20 0415 09/18/20 0748 09/18/20 0750  BP: (!) 154/98 (!) 164/93 (!) 144/98   Pulse: 65 62 64   Resp: '19 19 14   '$ Temp: 97.7 F (36.5 C) 98.1 F (36.7 C) 97.9 F (36.6 C)   TempSrc: Oral Oral Oral   SpO2: 99% 100% 100% 96%  Weight:      Height:        Wt Readings from Last 3 Encounters:  09/15/20 102.1 kg  09/13/20 102.1 kg  09/05/20 104.3 kg     Intake/Output Summary (Last 24 hours) at 09/18/2020 1026 Last data filed at 09/18/2020 0520 Gross per 24 hour  Intake --  Output 1050 ml  Net -1050 ml     Physical Exam  Awake Alert, No new F.N deficits, R side 2/5, Leg 2/5 [place].AT,PERRAL Supple Neck,No JVD, No cervical lymphadenopathy appriciated.  Symmetrical Chest wall movement, Good air movement bilaterally, CTAB RRR,No Gallops, Rubs or new Murmurs, No Parasternal Heave +ve B.Sounds, Abd Soft, No tenderness, No organomegaly appriciated, No rebound - guarding or rigidity. No Cyanosis, Clubbing or edema, No new Rash or bruise        Data Review:    CBC Recent Labs  Lab 09/13/20 1949 09/15/20 1348 09/17/20 0211 09/18/20 0439  WBC 14.4* 12.8* 12.3* 8.8  HGB 14.0 14.9 14.7 14.0  HCT 42.7 43.9 44.3 43.1  PLT 258 284 243 212  MCV 81.0 80.6 79.7* 81.2  MCH 26.6 27.3 26.4 26.4  MCHC 32.8 33.9 33.2 32.5  RDW 14.2 14.2 13.9 13.9  LYMPHSABS 1.7 1.1 2.3 2.2  MONOABS 1.0 1.0 1.1* 0.9  EOSABS 0.1 0.1 0.1 0.2  BASOSABS 0.1 0.0 0.0 0.1    Recent Labs  Lab 09/13/20 1949 09/15/20 1348 09/17/20 0211 09/18/20 0439  NA 136 137 137 136  K 3.5 3.5 3.9 3.6  CL 104 106 104 105  CO2 20* 20* 23 24  GLUCOSE 98 88 99 100*  BUN 31* 33* 35* 33*  CREATININE 2.35*  2.01* 2.00* 1.87*  CALCIUM 9.4 9.7 9.2 8.8*  AST  --  20 36 29  ALT  --  '19 19 20  '$ ALKPHOS  --  87 78 75  BILITOT  --  0.9 0.4 0.5  ALBUMIN  --  4.4 3.8 3.5  MG  --   --  2.2 2.1  INR  --  1.0  --   --     ------------------------------------------------------------------------------------------------------------------  Recent Labs    09/16/20 0406  CHOL 115  HDL 33*  LDLCALC 67  TRIG 77  CHOLHDL 3.5    Lab Results  Component Value Date   HGBA1C 7.6 (H) 09/07/2020   ------------------------------------------------------------------------------------------------------------------ No results for input(s): TSH, T4TOTAL, T3FREE, THYROIDAB in the last 72 hours.  Invalid input(s): FREET3  Cardiac Enzymes No results for input(s): CKMB, TROPONINI, MYOGLOBIN in the last 168 hours.  Invalid input(s): CK ------------------------------------------------------------------------------------------------------------------    Component Value Date/Time   BNP 48.0 06/07/2015 0115    Radiology Reports CT Angio Head W or Wo Contrast  Result Date: 09/06/2020 CLINICAL DATA:  Right-sided weakness, left-sided vision impairment. EXAM: CT ANGIOGRAPHY HEAD AND NECK TECHNIQUE: Multidetector CT imaging of the head and neck was performed using the standard protocol during bolus administration of intravenous contrast. Multiplanar CT image reconstructions and MIPs were obtained to evaluate the vascular anatomy. Carotid stenosis measurements (when applicable) are obtained utilizing NASCET criteria, using the distal internal carotid diameter as the denominator. CONTRAST:  25m OMNIPAQUE IOHEXOL 350 MG/ML SOLN COMPARISON:  CT angiogram of the head and neck November 23, 2018. FINDINGS: CTA NECK FINDINGS Aortic arch: Common origin of the innominate and left common carotid artery from the aortic arch. Imaged portion shows no evidence of aneurysm or dissection. No significant stenosis of the major arch vessel  origins. Right carotid system: Increased tortuosity of the cervical segment of the right ICA. No significant stenosis. Left carotid system: Mild atherosclerotic changes of the left carotid bifurcation without hemodynamically significant stenosis. Increased tortuosity of the cervical segment of the left ICA. Vertebral arteries: Codominant. No evidence of dissection, stenosis (50% or greater) or occlusion. Skeleton: No acute osseous abnormality. Periapical lucency at the right maxillary first molar tooth. Other neck: Enlarged thyroid gland with multiple hypodense nodules, the largest measuring up to 1.5 cm. Upper chest: Negative. Review of the MIP images confirms the above findings CTA HEAD FINDINGS Anterior circulation: Mild atherosclerotic changes of the bilateral carotid siphon without stenosis. Luminal irregularity seen along the bilateral MCA vascular trees with focus of moderate stenosis at a left M3/MCA superior division branch and mild stenosis at a right M3/MCA inferior division branch. There is also luminal irregularity in the bilateral ACA vascular trees with focal area of moderate stenosis at the right A3/ACA segment. Fenestration of the left A1/ACA again noted. Posterior circulation: Atherosclerotic changes of the bilateral intracranial vertebral arteries with moderate stenosis on the left. No significant stenosis on the right. The basilar artery has normal course and caliber. There is an occlusion of the right PCA at the P1-P2 junction with poor distal flow. Atherosclerotic changes of the left posterior cerebral artery with mild-to-moderate stenosis at the P2 segment. Venous sinuses: As permitted by contrast timing, patent. Anatomic variants: Fenestration of the left A1/ACA. Review of the MIP images confirms the above findings IMPRESSION: 1. Positive for large vessel occlusion at the right P1-P2/PCA junction. 2. Intracranial atherosclerotic disease with moderate stenosis at a left M3/MCA superior  division branch, mild stenosis at a right M3/MCA inferior division branch, moderate stenosis at the right A3/ACA segment, moderate stenosis at the left vertebral artery and mild-to-moderate stenosis at a left P2/PCA. 3. No significant atherosclerotic disease in the neck. These results were called by telephone at the time of interpretation on 09/06/2020 at 5:19 pm to Dr. XErlinda Hong who verbally acknowledged these results. Electronically Signed   By: KPedro EarlsM.D.   On: 09/06/2020 17:42   DG Chest 2 View  Result  Date: 09/13/2020 CLINICAL DATA:  Shortness of breath.  Right-sided weakness. EXAM: CHEST - 2 VIEW COMPARISON:  Radiograph 09/29/2018, chest CT 06/27/2020 FINDINGS: Lung volumes are low. Mild cardiomegaly. Bandlike scarring at the left lung base. No acute or confluent airspace disease. No pulmonary edema. No pleural effusion or pneumothorax. No acute osseous abnormalities are seen. IMPRESSION: Low lung volumes with mild cardiomegaly and left basilar scarring. Electronically Signed   By: Keith Rake M.D.   On: 09/13/2020 19:57   CT Head Wo Contrast  Result Date: 09/13/2020 CLINICAL DATA:  Right leg weakness EXAM: CT HEAD WITHOUT CONTRAST TECHNIQUE: Contiguous axial images were obtained from the base of the skull through the vertex without intravenous contrast. COMPARISON:  MRI 09/07/2020, CT 09/06/2020 FINDINGS: Brain: Mild residual hypodensity within the right occipital and posterior temporal lobes as well as splenium corpus callosum consistent with evolving right PCA infarct. No definitive hemorrhage seen. Hypodensity in the left pons, also consistent with evolving infarct. Negative for intracranial mass. Chronic left frontal lobe infarct. Chronic bilateral white matter and basal ganglial infarcts. Chronic lacunar infarcts in the thalamus. Stable ventricle size. Chronic small vessel ischemic changes of the white matter. Vascular: No hyperdense vessels. Mild carotid vascular  calcification. Vertebral artery calcification Skull: Normal. Negative for fracture or focal lesion. Sinuses/Orbits: Mucosal thickening in the sinuses Other: None IMPRESSION: 1. Evolving right PCA infarct without interval hemorrhage. Evolving left pontine infarct without interval hemorrhage. No definite new acute intracranial abnormality since prior study from 09/06/2020. 2. Atrophy with chronic small vessel ischemic changes of the white matter. Multiple chronic infarcts involving left frontal lobe, bilateral white matter, basal ganglia and thalamus. Electronically Signed   By: Donavan Foil M.D.   On: 09/13/2020 20:24   CT Angio Neck W and/or Wo Contrast  Result Date: 09/06/2020 CLINICAL DATA:  Right-sided weakness, left-sided vision impairment. EXAM: CT ANGIOGRAPHY HEAD AND NECK TECHNIQUE: Multidetector CT imaging of the head and neck was performed using the standard protocol during bolus administration of intravenous contrast. Multiplanar CT image reconstructions and MIPs were obtained to evaluate the vascular anatomy. Carotid stenosis measurements (when applicable) are obtained utilizing NASCET criteria, using the distal internal carotid diameter as the denominator. CONTRAST:  50m OMNIPAQUE IOHEXOL 350 MG/ML SOLN COMPARISON:  CT angiogram of the head and neck November 23, 2018. FINDINGS: CTA NECK FINDINGS Aortic arch: Common origin of the innominate and left common carotid artery from the aortic arch. Imaged portion shows no evidence of aneurysm or dissection. No significant stenosis of the major arch vessel origins. Right carotid system: Increased tortuosity of the cervical segment of the right ICA. No significant stenosis. Left carotid system: Mild atherosclerotic changes of the left carotid bifurcation without hemodynamically significant stenosis. Increased tortuosity of the cervical segment of the left ICA. Vertebral arteries: Codominant. No evidence of dissection, stenosis (50% or greater) or  occlusion. Skeleton: No acute osseous abnormality. Periapical lucency at the right maxillary first molar tooth. Other neck: Enlarged thyroid gland with multiple hypodense nodules, the largest measuring up to 1.5 cm. Upper chest: Negative. Review of the MIP images confirms the above findings CTA HEAD FINDINGS Anterior circulation: Mild atherosclerotic changes of the bilateral carotid siphon without stenosis. Luminal irregularity seen along the bilateral MCA vascular trees with focus of moderate stenosis at a left M3/MCA superior division branch and mild stenosis at a right M3/MCA inferior division branch. There is also luminal irregularity in the bilateral ACA vascular trees with focal area of moderate stenosis at the right A3/ACA segment. Fenestration  of the left A1/ACA again noted. Posterior circulation: Atherosclerotic changes of the bilateral intracranial vertebral arteries with moderate stenosis on the left. No significant stenosis on the right. The basilar artery has normal course and caliber. There is an occlusion of the right PCA at the P1-P2 junction with poor distal flow. Atherosclerotic changes of the left posterior cerebral artery with mild-to-moderate stenosis at the P2 segment. Venous sinuses: As permitted by contrast timing, patent. Anatomic variants: Fenestration of the left A1/ACA. Review of the MIP images confirms the above findings IMPRESSION: 1. Positive for large vessel occlusion at the right P1-P2/PCA junction. 2. Intracranial atherosclerotic disease with moderate stenosis at a left M3/MCA superior division branch, mild stenosis at a right M3/MCA inferior division branch, moderate stenosis at the right A3/ACA segment, moderate stenosis at the left vertebral artery and mild-to-moderate stenosis at a left P2/PCA. 3. No significant atherosclerotic disease in the neck. These results were called by telephone at the time of interpretation on 09/06/2020 at 5:19 pm to Dr. Erlinda Hong, who verbally acknowledged  these results. Electronically Signed   By: Pedro Earls M.D.   On: 09/06/2020 17:42   MR ANGIO HEAD WO CONTRAST  Result Date: 09/15/2020 CLINICAL DATA:  Stroke EXAM: MRA HEAD WITHOUT CONTRAST TECHNIQUE: Angiographic images of the Circle of Willis were acquired using MRA technique without intravenous contrast. COMPARISON:  MRI head 09/15/2020.  CT a 09/06/2020 FINDINGS: Anterior circulation: Motion degraded study. This significantly limits evaluation for small vessel disease. Internal carotid artery patent bilaterally without significant stenosis. Irregular areas of stenosis in the anterior and middle cerebral artery branches bilaterally consistent with widespread atherosclerotic disease. This is confirmed on the prior CTA. No large vessel occlusion. Posterior circulation: Both vertebral arteries are patent to the basilar. Basilar patent. Superior cerebellar arteries patent bilaterally. Occlusion of the proximal right posterior cerebral artery corresponding to right PCA infarct on MRI. There is severe stenosis of the left posterior cerebral artery which has distal flow. This appears progressive from the prior CT angiogram. Differences could be due to artifact from motion. Anatomic variants: None Other: None IMPRESSION: Motion degraded study Diffuse intracranial atherosclerotic disease, moderate to severe Occlusion right PCA. Severe stenosis left PCA with distal flow. This may have progressed since prior studies however is difficult to compare given the amount of motion on the MRA. Electronically Signed   By: Franchot Gallo M.D.   On: 09/15/2020 16:32   MR BRAIN WO CONTRAST  Result Date: 09/17/2020 CLINICAL DATA:  Stroke follow-up EXAM: MRI HEAD WITHOUT CONTRAST TECHNIQUE: Multiplanar, multiecho pulse sequences of the brain and surrounding structures were obtained without intravenous contrast. COMPARISON:  Two days ago FINDINGS: Brain: Acute left perforator infarct affecting the corona radiata  and caudate body. More weakly restricted diffusion in the right PCA territory at the occipital lobe and right posterior corpus callosum. Subacute perforator infarct at the left pons. No interval infarction is seen. No hemorrhagic complication. Remote left frontal operculum infarct. Chronic small vessel ischemia and lacunar infarcts. Vascular: No available T2 weighted imaging Skull and upper cervical spine: Negative Sinuses/Orbits: Negative Other: Progressively motion degraded. Truncated study per clinical request. IMPRESSION: Unchanged acute and subacute infarcts involving the infra and supratentorial brain. Electronically Signed   By: Monte Fantasia M.D.   On: 09/17/2020 06:17   MR BRAIN WO CONTRAST  Result Date: 09/15/2020 CLINICAL DATA:  Transient ischemic attack (TIA). Right leg weakness. EXAM: MRI HEAD WITHOUT CONTRAST TECHNIQUE: Multiplanar, multiecho pulse sequences of the brain and surrounding structures were  obtained without intravenous contrast. COMPARISON:  Head CT 09/13/2020 and MRI 09/07/2020 FINDINGS: Brain: There is a new acute left lateral lenticulostriate territory infarct extending from the posterior aspect of the left lentiform nucleus to the caudate body and involving the intervening deep white matter. There is residual diffusion weighted signal abnormality at the sites of the posterior circulation infarcts on the prior MRI (right temporal and occipital lobes, splenium of the corpus callosum, and left pons) without evidence of significant interval infarct extension. There is minimal petechial hemorrhage in the right occipital lobe. A chronic microhemorrhage is noted in the anterior right frontal lobe. Chronic lacunar infarcts are again noted involving the deep cerebral white matter bilaterally, basal ganglia, and thalami, and there is a chronic moderate-sized left MCA infarct near the anterior aspect of the left frontal operculum. T2 hyperintensities elsewhere in the cerebral white matter  bilaterally are unchanged and nonspecific but compatible with moderate chronic small vessel ischemic disease. No mass, midline shift, or extra-axial fluid collection is identified. There is mild cerebral atrophy. Vascular: Major intracranial vascular flow voids are preserved. Skull and upper cervical spine: Unremarkable bone marrow signal. Sinuses/Orbits: Unremarkable orbits. Minimal mucosal thickening or small mucous retention cyst in left sphenoid sinus. Small right and moderate left mastoid effusions. Other: None. IMPRESSION: 1. Acute left basal ganglia infarct. 2. Evolving subacute posterior circulation infarcts without extension from the 09/07/2020 MRI. 3. 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 Electronically Signed   By: Logan Bores M.D.   On: 09/15/2020 14:51   MR BRAIN WO CONTRAST  Result Date: 09/07/2020 CLINICAL DATA:  Stroke, follow-up.  Slurred speech. EXAM: MRI HEAD WITHOUT CONTRAST TECHNIQUE: Multiplanar, multiecho pulse sequences of the brain and surrounding structures were obtained without intravenous contrast. COMPARISON:  CT code stroke 09/06/2020. FINDINGS: Brain: Acute right PCA territory infarcts involving the right occipital lobe, parahippocampal/medial right temporal lobe, and splenium of the corpus callosum. Punctate acute infarct in the dorsal lateral. Right thalamus. There is associated edema and regional mass effect without midline shift. Additional left pontine acute infarct. No evidence of hemorrhagic transformation. Remote left frontal infarct with encephalomalacia and gliosis. Remote lacunar infarcts in bilateral basal ganglia,, corona radiata and thalami. Additional moderate scattered T2/FLAIR hyperintensities within the white matter, most likely related to chronic microvascular ischemic disease. Atrophy. Similar mild ventriculomegaly. Mild crowding of sulci at the vertex. No mass lesion. No midline shift. Basal cisterns are patent. No  extra-axial fluid collection. Vascular: See recent CTA for further evaluation. Skull and upper cervical spine: Normal marrow signal. Sinuses/Orbits: Left sphenoid sinus retention cyst. Mild ethmoid air cell mucosal thickening. No acute orbital findings. Other: Moderate bilateral mastoid effusions. IMPRESSION: 1. Acute right PCA territory infarcts, as described above. 2. Acute left pontine infarct. 3. Associated edema and regional mass effect without midline shift. No hemorrhagic transformation. 4. Remote left frontal infarct and remote lacunar infarcts in bilateral basal ganglia,, corona radiata and thalami. Moderate chronic microvascular ischemic disease. 5. Similar mild ventriculomegaly, most likely related to atrophy. Normal pressure hydrocephalus is thought less likely, but is a differential consideration. Electronically Signed   By: Margaretha Sheffield MD   On: 09/07/2020 09:22   DG Chest Port 1 View  Result Date: 09/15/2020 CLINICAL DATA:  Pt has history of 4 previous strokes since Dec. Pt typically has SOB, but worsened yesterday. Hx of COPD, diabetes, HTN. Current smoker. EXAM: PORTABLE CHEST 1 VIEW COMPARISON:  Chest x-ray 09/13/2020, CT chest 06/27/2020 FINDINGS: Enlarged cardiac silhouette. The heart size  and mediastinal contours are unchanged. No focal consolidation. No pulmonary edema. No pleural effusion. No pneumothorax. No acute osseous abnormality. IMPRESSION: No active disease. Electronically Signed   By: Iven Finn M.D.   On: 09/15/2020 15:32   ECHOCARDIOGRAM COMPLETE  Result Date: 09/07/2020    ECHOCARDIOGRAM REPORT   Patient Name:   ICHIRO BAUGHER Date of Exam: 09/07/2020 Medical Rec #:  [id number]       Height:       67.0 in Accession #:    [id number]      Weight:       230.0 lb Date of Birth:  1966-01-22       BSA:          2.146 m Patient Age:    37 years        BP:           130/92 mmHg Patient Gender: M               HR:           62 bpm. Exam Location:  [facility] Procedure: 2D  Echo, Cardiac Doppler and Color Doppler Indications:    Stroke  History:        Patient has prior history of Echocardiogram examinations, most                 recent 03/17/2017. CHF, Stroke and COPD, Signs/Symptoms:Shortness                 of Breath and Chest Pain; Risk Factors:Hypertension, Diabetes                 and Current Smoker.  Sonographer:    Wenda Low Referring Phys: [place]  1. Left ventricular ejection fraction, by estimation, is 60 to 65%. The left ventricle has normal function. The left ventricle has no regional wall motion abnormalities. There is severe left ventricular hypertrophy. Left ventricular diastolic parameters  are consistent with Grade I diastolic dysfunction (impaired relaxation).  2. Right ventricular systolic function is normal. The right ventricular size is normal. Tricuspid regurgitation signal is inadequate for assessing PA pressure.  3. The mitral valve is normal in structure. No evidence of mitral valve regurgitation. No evidence of mitral stenosis.  4. The aortic valve has an indeterminant number of cusps. Aortic valve regurgitation is not visualized. No aortic stenosis is present. FINDINGS  Left Ventricle: Left ventricular ejection fraction, by estimation, is 60 to 65%. The left ventricle has normal function. The left ventricle has no regional wall motion abnormalities. The left ventricular internal cavity size was normal in size. There is  severe left ventricular hypertrophy. Left ventricular diastolic parameters are consistent with Grade I diastolic dysfunction (impaired relaxation). Normal left ventricular filling pressure. Right Ventricle: The right ventricular size is normal. No increase in right ventricular wall thickness. Right ventricular systolic function is normal. Tricuspid regurgitation signal is inadequate for assessing PA pressure. Left Atrium: Left atrial size was normal in size. Right Atrium: Right atrial size was normal in  size. Pericardium: There is no evidence of pericardial effusion. Mitral Valve: The mitral valve is normal in structure. No evidence of mitral valve regurgitation. No evidence of mitral valve stenosis. MV peak gradient, 3.7 mmHg. The mean mitral valve gradient is 1.0 mmHg. Tricuspid Valve: The tricuspid valve is normal in structure. Tricuspid valve regurgitation is not demonstrated. No evidence of tricuspid stenosis. Aortic Valve: The aortic valve has an indeterminant number of cusps. Aortic valve  regurgitation is not visualized. No aortic stenosis is present. Aortic valve mean gradient measures 4.0 mmHg. Aortic valve peak gradient measures 6.9 mmHg. Aortic valve area, by VTI measures 2.38 cm. Pulmonic Valve: The pulmonic valve was not well visualized. Pulmonic valve regurgitation is not visualized. No evidence of pulmonic stenosis. Aorta: The aortic root is normal in size and structure. Venous: The inferior vena cava was not well visualized. IAS/Shunts: No atrial level shunt detected by color flow Doppler.  LEFT VENTRICLE PLAX 2D LVIDd:         4.50 cm  Diastology LVIDs:         2.80 cm  LV e' medial:    6.05 cm/s LV PW:         1.93 cm  LV E/e' medial:  9.9 LV IVS:        1.94 cm  LV e' lateral:   4.88 cm/s LVOT diam:     2.00 cm  LV E/e' lateral: 12.3 LV SV:         68 LV SV Index:   32 LVOT Area:     3.14 cm  LEFT ATRIUM             Index       RIGHT ATRIUM           Index LA diam:        4.60 cm 2.14 cm/m  RA Area:     15.50 cm LA Vol (A2C):   28.4 ml 13.23 ml/m RA Volume:   39.20 ml  18.27 ml/m LA Vol (A4C):   48.8 ml 22.74 ml/m LA Biplane Vol: 36.9 ml 17.19 ml/m  AORTIC VALVE AV Area (Vmax):    3.09 cm AV Area (Vmean):   3.16 cm AV Area (VTI):     2.38 cm AV Vmax:           131.00 cm/s AV Vmean:          88.600 cm/s AV VTI:            0.285 m AV Peak Grad:      6.9 mmHg AV Mean Grad:      4.0 mmHg LVOT Vmax:         129.00 cm/s LVOT Vmean:        89.200 cm/s LVOT VTI:          0.216 m LVOT/AV VTI  ratio: 0.76  AORTA Ao Root diam: 3.85 cm Ao Asc diam:  3.40 cm MITRAL VALVE MV Area (PHT): 2.76 cm    SHUNTS MV Area VTI:   1.90 cm    Systemic VTI:  0.22 m MV Peak grad:  3.7 mmHg    Systemic Diam: 2.00 cm MV Mean grad:  1.0 mmHg MV Vmax:       0.96 m/s MV Vmean:      45.1 cm/s MV Decel Time: 275 msec MV E velocity: 59.90 cm/s MV A velocity: 86.70 cm/s MV E/A ratio:  0.69 Carlyle Dolly MD Electronically signed by Carlyle Dolly MD Signature Date/Time: 09/07/2020/2:40:42 PM    Final    CT HEAD CODE STROKE WO CONTRAST  Result Date: 09/06/2020 CLINICAL DATA:  Code stroke.  Neuro deficit, acute stroke suspected. EXAM: CT HEAD WITHOUT CONTRAST TECHNIQUE: Contiguous axial images were obtained from the base of the skull through the vertex without intravenous contrast. COMPARISON:  CT head 11/22/2018. FINDINGS: Brain: Acute right PCA territory infarcts, including the right occipital lobe, right mesial temporal lobe and potentially the right  thalamus and right aspect of the splenium of the corpus callosum. Associated edema and local mass effect. No midline shift. Mild hyperdensity in the right occipital lobe. Remote infarcts in the left frontal lobe with encephalomalacia. Remote infarcts in bilateral corona radiata and basal ganglia. Additional moderate patchy white matter hypoattenuation, most likely chronic microvascular ischemic disease. Similar atrophy. Similar mild ventriculomegaly, mildly disproportionate to the degree of sulcal enlargement. Additionally there is similar crowding of sulci at the vertex and a mildly acute callosal angle. Vascular: Subtle hyperdensity within the right PCA (series 5, image 36; series 6, image 30), potentially thrombus. Intracranial atherosclerosis. Skull: No acute fracture. Sinuses/Orbits: Sinuses are largely clear.  Unremarkable orbits. Other: No mastoid effusions. IMPRESSION: 1. Acute right PCA territory infarcts, including the right occipital lobe, right mesial/parahippocampal  temporal lobe and potentially the right thalamus and right aspect of the splenium of the corpus callosum. Associated edema and local mass effect without midline shift. 2. Mild hyperdensity in the right occipital lobe may represent spared cortex and/or petechial hemorrhage and warrants attention on follow-up. 3. Subtle hyperdensity within the right PCA (series 5, image 36; series 6, image 30), concerning for thrombus given the above findings. Recommend CTA. 4. Remote infarcts in the left frontal lobe and remote lacunar infarcts in bilateral corona radiata and basal ganglia. 5. Moderate chronic microvascular ischemic disease. 6. Similar mild ventriculomegaly, mildly disproportionate to the degree of sulcal enlargement. Findings could be secondary to central predominant volume loss versus normal pressure hydrocephalus. Code stroke imaging results were communicated on 09/06/2020 at 4:46 pm to provider Dr. Laverta Baltimore via telephone, who verbally acknowledged these results. Electronically Signed   By: Margaretha Sheffield MD   On: 09/06/2020 16:59   CT ANGIO HEAD NECK W WO CM W PERF  Result Date: 09/16/2020 CLINICAL DATA:  Stroke follow-up.  Right arm numbness. EXAM: CT ANGIOGRAPHY HEAD AND NECK CT PERFUSION BRAIN TECHNIQUE: Multidetector CT imaging of the head and neck was performed using the standard protocol during bolus administration of intravenous contrast. Multiplanar CT image reconstructions and MIPs were obtained to evaluate the vascular anatomy. Carotid stenosis measurements (when applicable) are obtained utilizing NASCET criteria, using the distal internal carotid diameter as the denominator. Multiphase CT imaging of the brain was performed following IV bolus contrast injection. Subsequent parametric perfusion maps were calculated using RAPID software. CONTRAST:  113m OMNIPAQUE IOHEXOL 350 MG/ML SOLN COMPARISON:  Head MRI and MRA 09/15/2020. Head and neck CTA 09/06/2020. FINDINGS: CT HEAD FINDINGS Brain: Hypodensity  in the posterior left basal ganglia/posterior limb of internal capsule/corona radiata corresponds to the acute infarct which is better demonstrated on yesterday's MRI. No acute cortically based infarct, mass, midline shift, or extra-axial fluid collection is identified. Multiple subacute posterior circulation infarcts are again noted involving the posteromedial right temporal and occipital lobes, splenium of the corpus callosum, and left pons with minimal petechial hemorrhage again noted in the right occipital lobe. There is a chronic moderate-sized left frontal lobe MCA infarct, and there are moderate chronic small-vessel changes in the cerebral white matter with chronic lacunar infarcts in the deep cerebral white matter bilaterally, basal ganglia, and thalami. There is mild cerebral atrophy. Vascular: No hyperdense vessel. Skull: No fracture or suspicious osseous lesion. Sinuses/Orbits: Small mucous retention cyst in the left sphenoid sinus. Small to moderate mastoid effusions. Unremarkable orbits. Other: None. CTA NECK FINDINGS Aortic arch: Normal variant aortic arch branching pattern with common origin of the brachiocephalic and left common carotid arteries. Widely patent arch vessel origins. Right carotid  system: Patent without evidence of stenosis, dissection, or significant atherosclerosis. Tortuous cervical ICA. Left carotid system: Patent with a small amount of calcified plaque at the carotid bifurcation. No evidence of a significant stenosis or dissection. Tortuous cervical ICA. Vertebral arteries: Patent and codominant without evidence of a significant stenosis or dissection. Skeleton: Mild cervical spondylosis. Other neck: Bilateral thyroid nodules measuring up to 1.5 cm. No evidence of cervical lymphadenopathy. Upper chest: Clear lung apices. Review of the MIP images confirms the above findings CTA HEAD FINDINGS Anterior circulation: The internal carotid arteries are patent from skull base to carotid  termini with mild atherosclerosis not resulting in a significant stenosis. ACAs and MCAs are patent without evidence of a proximal branch occlusion. There is widespread atherosclerotic type irregularity including an unchanged mild proximal left M1 stenosis. Severe left A3 stenoses appear increased from the prior CTA. No aneurysm is identified. Posterior circulation: The intracranial vertebral arteries are patent to the basilar with atherosclerotic plaque resulting in an unchanged moderate stenosis on the left. The a severe proximal right AICA stenosis is again seen. Patent SCA is are identified bilaterally. The basilar artery is widely patent. There is a small left posterior communicating artery. There is unchanged occlusion of the right PCA near the P1-P2 junction. The left PCA is patent but diffusely irregular with a severe stenosis in the proximal P2 segment which appears increased from the prior CTA but less extensive than what was suggested on the more recent MRA. No aneurysm is identified. Venous sinuses: Hypoplastic and poorly visualized right transverse and sigmoid sinuses, unchanged. Other major dural venous sinuses are patent. Anatomic variants: Left A1 fenestration. Review of the MIP images confirms the above findings CT Brain Perfusion Findings: CBF (<30%) Volume: 0 mL Perfusion (Tmax>6.0s) volume: 45 mL Mismatch Volume: 45 mL Infarction Location: No acute core infarct is identified by automated processing. The region of delayed perfusion corresponds to the subacute right PCA infarct. IMPRESSION: CT HEAD: 1. Known acute left basal ganglia infarct and multiple subacute posterior circulation infarcts as shown on yesterday's MRI. 2. No definite new infarct or intracranial hemorrhage. 3. Chronic ischemia with multiple old infarcts as above. CTA HEAD AND NECK: 1. No emergent large vessel occlusion. 2. Unchanged right PCA occlusion. 3. Advanced intracranial atherosclerosis including severe proximal left PCA  and left A3 stenoses which appear increased from the prior CTA. 4. Widely patent cervical carotid arteries. 5. Thyroid nodules measuring up to 1.5 cm. Recommend thyroid US (ref: J Am Coll Radiol. 2015 Feb;12(2): 143-50). CT PERFUSION: No evidence of acute core infarct or penumbra identified by automated processing. Electronically Signed   By: Logan Bores M.D.   On: 09/16/2020 10:32

## 2020-09-18 NOTE — Progress Notes (Signed)
  Echocardiogram Echocardiogram Transesophageal has been performed.  Craig Knight 09/18/2020, 1:57 PM

## 2020-09-18 NOTE — Care Management Important Message (Signed)
Important Message  Patient Details  Name: Craig Knight MRN: [id number] Date of Birth: [birth year]   Medicare Important Message Given:        Orbie Pyo 09/18/2020, 4:10 PM

## 2020-09-18 NOTE — Transfer of Care (Signed)
Immediate Anesthesia Transfer of Care Note  Patient: Craig Knight  Procedure(s) Performed: TRANSESOPHAGEAL ECHOCARDIOGRAM (TEE) BUBBLE STUDY  Patient Location: Endoscopy Unit  Anesthesia Type:MAC  Level of Consciousness: drowsy and patient cooperative  Airway & Oxygen Therapy: Patient Spontanous Breathing and Patient connected to nasal cannula oxygen  Post-op Assessment: Report given to RN and Post -op Vital signs reviewed and stable  Post vital signs: Reviewed and stable  Last Vitals:  Vitals Value Taken Time  BP 122/64   Temp    Pulse 83   Resp 14   SpO2 95     Last Pain:  Vitals:   09/18/20 1237  TempSrc: Temporal  PainSc: 0-No pain         Complications: No notable events documented.

## 2020-09-18 NOTE — Anesthesia Preprocedure Evaluation (Addendum)
Anesthesia Evaluation  Patient identified by MRN, date of birth, ID band Patient awake    Reviewed: Allergy & Precautions, NPO status , Patient's Chart, lab work & pertinent test results  Airway Mallampati: III  TM Distance: >3 FB Neck ROM: Full  Mouth opening: Limited Mouth Opening Comment: Tongue deviated to right Dental  (+) Teeth Intact, Dental Advisory Given   Pulmonary asthma , sleep apnea , COPD,  COPD inhaler, Current Smoker,    Pulmonary exam normal breath sounds clear to auscultation       Cardiovascular hypertension, Pt. on medications and Pt. on home beta blockers +CHF  Normal cardiovascular exam Rhythm:Regular Rate:Normal  TTE 2022 1. Left ventricular ejection fraction, by estimation, is 60 to 65%. The  left ventricle has normal function. The left ventricle has no regional  wall motion abnormalities. There is severe left ventricular hypertrophy.  Left ventricular diastolic parameters  are consistent with Grade I diastolic dysfunction (impaired relaxation).  2. Right ventricular systolic function is normal. The right ventricular  size is normal. Tricuspid regurgitation signal is inadequate for assessing  PA pressure.  3. The mitral valve is normal in structure. No evidence of mitral valve  regurgitation. No evidence of mitral stenosis.  4. The aortic valve has an indeterminant number of cusps. Aortic valve  regurgitation is not visualized. No aortic stenosis is present.   Stess Test 2019 Conclusion: The interpretation of this test is greatly limited due to submaximal effort during the exercise. Based on available data, exercise testing with gas exchange demonstrates mild functional impairment when compared to matched sedentary norms. There is no clear indication for circulatory limitation, however there was a marked hypertensive response at peak exercise. Pre-exercise spirometry demonstrates mild restrictive  properties which are very likely related to his body habitus. At peak exercise and measurements IPE, patient appears mildly limited due to exercise-induced bronchospasm.   Neuro/Psych PSYCHIATRIC DISORDERS Depression CVA (right sided weakness), Residual Symptoms    GI/Hepatic negative GI ROS, Neg liver ROS,   Endo/Other  diabetes, Type 2, Oral Hypoglycemic Agents  Renal/GU Renal InsufficiencyRenal disease (Cr 1.87, K 3.6)  negative genitourinary   Musculoskeletal negative musculoskeletal ROS (+)   Abdominal   Peds  Hematology  (+) Blood dyscrasia (on plavix, brilinta), ,   Anesthesia Other Findings 55 y.o. male, HTN, DM-2, diastolic CHF, SHF-0Y, COPD, OSA, recent hospitalization 7/13 with acute right PCA territory infarct now with worsening right-sided weakness, MRI of the brain significant for new acute CVA in the left basal ganglia, was not present during recent hospitalization for another CVA in the PCA area.   Reproductive/Obstetrics                           Anesthesia Physical Anesthesia Plan  ASA: 3  Anesthesia Plan: MAC   Post-op Pain Management:    Induction: Intravenous  PONV Risk Score and Plan: Propofol infusion and Treatment may vary due to age or medical condition  Airway Management Planned: Natural Airway  Additional Equipment:   Intra-op Plan:   Post-operative Plan:   Informed Consent: I have reviewed the patients History and Physical, chart, labs and discussed the procedure including the risks, benefits and alternatives for the proposed anesthesia with the patient or authorized representative who has indicated his/her understanding and acceptance.     Dental advisory given  Plan Discussed with: CRNA  Anesthesia Plan Comments:         Anesthesia Quick Evaluation

## 2020-09-18 NOTE — Progress Notes (Signed)
Inpatient Rehab Admissions Coordinator:  Insurance authorization for [employer] received. Admission to CIR pending bed availability and medical stability.  Will continue to follow.    Gayland Curry, [place], [place] Admissions Coordinator [phone]

## 2020-09-18 NOTE — Anesthesia Procedure Notes (Signed)
Procedure Name: MAC Date/Time: 09/18/2020 1:21 PM Performed by: Kathryne Hitch, CRNA Pre-anesthesia Checklist: Patient identified, Emergency Drugs available, Suction available and Patient being monitored Patient Re-evaluated:Patient Re-evaluated prior to induction Oxygen Delivery Method: Nasal cannula Preoxygenation: Pre-oxygenation with 100% oxygen Induction Type: IV induction Placement Confirmation: positive ETCO2 Dental Injury: Teeth and Oropharynx as per pre-operative assessment

## 2020-09-18 NOTE — Interval H&P Note (Signed)
History and Physical Interval Note:  09/18/2020 12:12 PM  Craig Knight  has presented today for surgery, with the diagnosis of stroke.  The various methods of treatment have been discussed with the patient and family. After consideration of risks, benefits and other options for treatment, the patient has consented to  Procedure(s): TRANSESOPHAGEAL ECHOCARDIOGRAM (TEE) (N/A) as a surgical intervention.  The patient's history has been reviewed, patient examined, no change in status, stable for surgery.  I have reviewed the patient's chart and labs.  Questions were answered to the patient's satisfaction.     Pixie Casino

## 2020-09-19 ENCOUNTER — Other Ambulatory Visit: Payer: Self-pay

## 2020-09-19 ENCOUNTER — Encounter (HOSPITAL_COMMUNITY): Payer: Self-pay | Admitting: Physical Medicine & Rehabilitation

## 2020-09-19 ENCOUNTER — Inpatient Hospital Stay (HOSPITAL_COMMUNITY)
Admission: RE | Admit: 2020-09-19 | Discharge: 2020-10-13 | DRG: 057 | Disposition: A | Discharge status: Home / Self Care | Payer: Medicare Other | Source: Intra-hospital | Attending: Physical Medicine & Rehabilitation | Admitting: Physical Medicine & Rehabilitation

## 2020-09-19 ENCOUNTER — Other Ambulatory Visit: Payer: Self-pay | Admitting: *Deleted

## 2020-09-19 ENCOUNTER — Inpatient Hospital Stay (HOSPITAL_COMMUNITY)
Admit: 2020-09-19 | Discharge: 2020-09-19 | Disposition: A | Discharge status: Home / Self Care | Payer: Medicare Other | Attending: Physician Assistant | Admitting: Physician Assistant

## 2020-09-19 DIAGNOSIS — E1122 Type 2 diabetes mellitus with diabetic chronic kidney disease: Secondary | ICD-10-CM | POA: Diagnosis present

## 2020-09-19 DIAGNOSIS — N1831 Chronic kidney disease, stage 3a: Secondary | ICD-10-CM | POA: Diagnosis present

## 2020-09-19 DIAGNOSIS — E119 Type 2 diabetes mellitus without complications: Secondary | ICD-10-CM

## 2020-09-19 DIAGNOSIS — R251 Tremor, unspecified: Secondary | ICD-10-CM | POA: Diagnosis present

## 2020-09-19 DIAGNOSIS — Z6835 Body mass index (BMI) 35.0-35.9, adult: Secondary | ICD-10-CM | POA: Diagnosis not present

## 2020-09-19 DIAGNOSIS — I69351 Hemiplegia and hemiparesis following cerebral infarction affecting right dominant side: Principal | ICD-10-CM

## 2020-09-19 DIAGNOSIS — N183 Chronic kidney disease, stage 3 unspecified: Secondary | ICD-10-CM | POA: Diagnosis present

## 2020-09-19 DIAGNOSIS — I69391 Dysphagia following cerebral infarction: Secondary | ICD-10-CM | POA: Diagnosis not present

## 2020-09-19 DIAGNOSIS — E041 Nontoxic single thyroid nodule: Secondary | ICD-10-CM | POA: Diagnosis present

## 2020-09-19 DIAGNOSIS — Z7982 Long term (current) use of aspirin: Secondary | ICD-10-CM | POA: Diagnosis not present

## 2020-09-19 DIAGNOSIS — R1310 Dysphagia, unspecified: Secondary | ICD-10-CM | POA: Diagnosis present

## 2020-09-19 DIAGNOSIS — I639 Cerebral infarction, unspecified: Secondary | ICD-10-CM

## 2020-09-19 DIAGNOSIS — Z794 Long term (current) use of insulin: Secondary | ICD-10-CM | POA: Diagnosis not present

## 2020-09-19 DIAGNOSIS — Z823 Family history of stroke: Secondary | ICD-10-CM

## 2020-09-19 DIAGNOSIS — I69319 Unspecified symptoms and signs involving cognitive functions following cerebral infarction: Secondary | ICD-10-CM

## 2020-09-19 DIAGNOSIS — E1142 Type 2 diabetes mellitus with diabetic polyneuropathy: Secondary | ICD-10-CM

## 2020-09-19 DIAGNOSIS — F329 Major depressive disorder, single episode, unspecified: Secondary | ICD-10-CM | POA: Diagnosis present

## 2020-09-19 DIAGNOSIS — I130 Hypertensive heart and chronic kidney disease with heart failure and stage 1 through stage 4 chronic kidney disease, or unspecified chronic kidney disease: Secondary | ICD-10-CM | POA: Diagnosis present

## 2020-09-19 DIAGNOSIS — Z800 Family history of malignant neoplasm of digestive organs: Secondary | ICD-10-CM

## 2020-09-19 DIAGNOSIS — F17210 Nicotine dependence, cigarettes, uncomplicated: Secondary | ICD-10-CM | POA: Diagnosis present

## 2020-09-19 DIAGNOSIS — Z8249 Family history of ischemic heart disease and other diseases of the circulatory system: Secondary | ICD-10-CM | POA: Diagnosis not present

## 2020-09-19 DIAGNOSIS — I5032 Chronic diastolic (congestive) heart failure: Secondary | ICD-10-CM | POA: Diagnosis present

## 2020-09-19 DIAGNOSIS — Z79899 Other long term (current) drug therapy: Secondary | ICD-10-CM

## 2020-09-19 DIAGNOSIS — Z7951 Long term (current) use of inhaled steroids: Secondary | ICD-10-CM

## 2020-09-19 DIAGNOSIS — I10 Essential (primary) hypertension: Secondary | ICD-10-CM | POA: Diagnosis not present

## 2020-09-19 DIAGNOSIS — E669 Obesity, unspecified: Secondary | ICD-10-CM | POA: Diagnosis present

## 2020-09-19 DIAGNOSIS — I69322 Dysarthria following cerebral infarction: Secondary | ICD-10-CM

## 2020-09-19 DIAGNOSIS — Z7902 Long term (current) use of antithrombotics/antiplatelets: Secondary | ICD-10-CM | POA: Diagnosis not present

## 2020-09-19 DIAGNOSIS — H539 Unspecified visual disturbance: Secondary | ICD-10-CM | POA: Diagnosis present

## 2020-09-19 DIAGNOSIS — E785 Hyperlipidemia, unspecified: Secondary | ICD-10-CM | POA: Diagnosis present

## 2020-09-19 DIAGNOSIS — Z888 Allergy status to other drugs, medicaments and biological substances status: Secondary | ICD-10-CM

## 2020-09-19 DIAGNOSIS — I69398 Other sequelae of cerebral infarction: Secondary | ICD-10-CM

## 2020-09-19 DIAGNOSIS — J449 Chronic obstructive pulmonary disease, unspecified: Secondary | ICD-10-CM | POA: Diagnosis present

## 2020-09-19 DIAGNOSIS — R0602 Shortness of breath: Secondary | ICD-10-CM

## 2020-09-19 DIAGNOSIS — I4891 Unspecified atrial fibrillation: Secondary | ICD-10-CM

## 2020-09-19 LAB — GLUCOSE, CAPILLARY
Glucose-Capillary: 114 mg/dL — ABNORMAL HIGH (ref 70–99)
Glucose-Capillary: 90 mg/dL (ref 70–99)
Glucose-Capillary: 119 mg/dL — ABNORMAL HIGH (ref 70–99)
Glucose-Capillary: 95 mg/dL (ref 70–99)

## 2020-09-19 MED ORDER — ACETAMINOPHEN 325 MG PO TABS
650.0000 mg | ORAL_TABLET | ORAL | Status: DC | PRN
  Administered 2020-09-21 – 2020-10-12 (×8): 650 mg via ORAL
  Filled 2020-09-19 (×9): qty 2

## 2020-09-19 MED ORDER — IPRATROPIUM-ALBUTEROL 0.5-2.5 (3) MG/3ML IN SOLN
3.0000 mL | RESPIRATORY_TRACT | Status: DC | PRN
  Administered 2020-09-20 – 2020-10-10 (×19): 3 mL via RESPIRATORY_TRACT
  Filled 2020-09-19 (×9): qty 3
  Filled 2020-09-19: qty 9
  Filled 2020-09-19 (×10): qty 3

## 2020-09-19 MED ORDER — INSULIN ASPART 100 UNIT/ML IJ SOLN
0.0000 [IU] | Freq: Three times a day (TID) | INTRAMUSCULAR | Status: DC
  Administered 2020-09-21 – 2020-09-26 (×10): 1 [IU] via SUBCUTANEOUS
  Administered 2020-09-27: 2 [IU] via SUBCUTANEOUS
  Administered 2020-09-28 – 2020-09-29 (×3): 1 [IU] via SUBCUTANEOUS
  Administered 2020-09-29: 3 [IU] via SUBCUTANEOUS
  Administered 2020-09-30: 1 [IU] via SUBCUTANEOUS
  Administered 2020-09-30: 2 [IU] via SUBCUTANEOUS
  Administered 2020-10-01 – 2020-10-10 (×13): 1 [IU] via SUBCUTANEOUS
  Administered 2020-10-11: 2 [IU] via SUBCUTANEOUS

## 2020-09-19 MED ORDER — BLOOD PRESSURE CONTROL BOOK
Freq: Once | Status: AC
  Filled 2020-09-19: qty 1

## 2020-09-19 MED ORDER — ACETAMINOPHEN 650 MG RE SUPP: 650.0000 mg | RECTAL | Status: DC | PRN

## 2020-09-19 MED ORDER — ATORVASTATIN CALCIUM 80 MG PO TABS
80.0000 mg | ORAL_TABLET | Freq: Every day | ORAL | Status: DC
  Administered 2020-09-20 – 2020-10-13 (×24): 80 mg via ORAL
  Filled 2020-09-19 (×24): qty 1

## 2020-09-19 MED ORDER — ENOXAPARIN SODIUM 40 MG/0.4ML IJ SOSY
40.0000 mg | PREFILLED_SYRINGE | INTRAMUSCULAR | Status: DC
  Administered 2020-09-20 – 2020-10-13 (×21): 40 mg via SUBCUTANEOUS
  Filled 2020-09-19 (×22): qty 0.4

## 2020-09-19 MED ORDER — ASPIRIN EC 81 MG PO TBEC
81.0000 mg | DELAYED_RELEASE_TABLET | Freq: Every day | ORAL | Status: DC
  Administered 2020-09-20 – 2020-10-13 (×24): 81 mg via ORAL
  Filled 2020-09-19 (×24): qty 1

## 2020-09-19 MED ORDER — MUPIROCIN 2 % EX OINT
1.0000 "application " | TOPICAL_OINTMENT | Freq: Two times a day (BID) | CUTANEOUS | Status: AC
  Administered 2020-09-19 – 2020-09-23 (×7): 1 via NASAL
  Filled 2020-09-19: qty 22

## 2020-09-19 MED ORDER — ENOXAPARIN SODIUM 40 MG/0.4ML IJ SOSY: 40.0000 mg | PREFILLED_SYRINGE | INTRAMUSCULAR | Status: DC

## 2020-09-19 MED ORDER — INSULIN LISPRO (1 UNIT DIAL) 100 UNIT/ML (KWIKPEN): PEN_INJECTOR | SUBCUTANEOUS | 0 refills | Status: DC

## 2020-09-19 MED ORDER — ACETAMINOPHEN 160 MG/5ML PO SOLN: 650.0000 mg | ORAL | Status: DC | PRN

## 2020-09-19 MED ORDER — TRAZODONE HCL 50 MG PO TABS
100.0000 mg | ORAL_TABLET | Freq: Every day | ORAL | Status: DC
  Administered 2020-09-19 – 2020-10-03 (×15): 100 mg via ORAL
  Filled 2020-09-19 (×15): qty 2

## 2020-09-19 MED ORDER — UMECLIDINIUM-VILANTEROL 62.5-25 MCG/INH IN AEPB
1.0000 | INHALATION_SPRAY | Freq: Every day | RESPIRATORY_TRACT | Status: DC
  Administered 2020-09-21 – 2020-10-13 (×22): 1 via RESPIRATORY_TRACT
  Filled 2020-09-19 (×4): qty 14

## 2020-09-19 MED ORDER — CHLORTHALIDONE 25 MG PO TABS
25.0000 mg | ORAL_TABLET | Freq: Every day | ORAL | Status: DC
  Administered 2020-09-20 – 2020-10-13 (×24): 25 mg via ORAL
  Filled 2020-09-19 (×24): qty 1

## 2020-09-19 MED ORDER — TICAGRELOR 90 MG PO TABS
90.0000 mg | ORAL_TABLET | Freq: Two times a day (BID) | ORAL | Status: DC
  Administered 2020-09-19 – 2020-10-13 (×48): 90 mg via ORAL
  Filled 2020-09-19 (×48): qty 1

## 2020-09-19 MED ORDER — GABAPENTIN 300 MG PO CAPS
300.0000 mg | ORAL_CAPSULE | Freq: Every day | ORAL | Status: DC
  Administered 2020-09-19 – 2020-10-12 (×24): 300 mg via ORAL
  Filled 2020-09-19 (×24): qty 1

## 2020-09-19 MED ORDER — TICAGRELOR 90 MG PO TABS: 90.0000 mg | ORAL_TABLET | Freq: Two times a day (BID) | ORAL | Status: DC

## 2020-09-19 MED ORDER — EXERCISE FOR HEART AND HEALTH BOOK
Freq: Once | Status: AC
  Filled 2020-09-19: qty 1

## 2020-09-19 MED ORDER — CLOPIDOGREL BISULFATE 75 MG PO TABS: 75.0000 mg | ORAL_TABLET | Freq: Every day | ORAL | Status: DC

## 2020-09-19 NOTE — Anesthesia Postprocedure Evaluation (Signed)
Anesthesia Post Note  Patient: Craig Knight  Procedure(s) Performed: TRANSESOPHAGEAL ECHOCARDIOGRAM (TEE) BUBBLE STUDY     Patient location during evaluation: Endoscopy Anesthesia Type: MAC Level of consciousness: awake and alert Pain management: pain level controlled Vital Signs Assessment: post-procedure vital signs reviewed and stable Respiratory status: spontaneous breathing, nonlabored ventilation, respiratory function stable and patient connected to nasal cannula oxygen Cardiovascular status: blood pressure returned to baseline and stable Postop Assessment: no apparent nausea or vomiting Anesthetic complications: no   No notable events documented.  Last Vitals:  Vitals:   09/19/20 0750 09/19/20 0848  BP: (!) 126/100   Pulse: 65   Resp: 14   Temp: 36.7 C   SpO2: 99% 98%    Last Pain:  Vitals:   09/19/20 0750  TempSrc: Oral  PainSc:    Pain Goal:                   Westen Dinino L [place] Mcphee

## 2020-09-19 NOTE — Discharge Summary (Addendum)
Craig Knight [id number] DOB: 13-May-1965 DOA: 09/15/2020  PCP: Rosita Fire, MD  Admit date: 09/15/2020  Discharge date: 09/19/2020  Admitted From: Home  Disposition:  CIR   Recommendations for Outpatient Follow-up:   Follow up with PCP in 1-2 weeks  PCP Please obtain BMP/CBC, 2 view CXR in 1week,  (see Discharge instructions)   PCP Please follow up on the following pending results:    Home Health: None   Equipment/Devices: None  Consultations: Neuro Discharge Condition: Stable    CODE STATUS: Full    Diet Recommendation: Heart Healthy - Low Carb    Chief Complaint  Patient presents with   Shortness of Breath     Brief history of present illness from the day of admission and additional interim summary    Craig Knight  is a 55 y.o. 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  He was found to have a new stroke and was transferred to [facility] for further treatment.                                                                 Hospital Course     Dense right-sided hemiparesis due to acute left basal ganglia infarct, evolving subacute posterior circulation infarcts without extension from the 09/07/2020 MRI. Moderate chronic small vessel ischemic disease with multiple old infarcts - in a patient with recent right PCA occlusion and stroke who was already on aspirin Plavix and statin.  He is now on aspirin Brilinta and statin, neurology following, unfortunately not a candidate for tPA or interventional  neurology intervention.  Loop recorder to be placed by cardiology on 09/19/2020.  Plan is to give him aspirin and Brilinta for total of 30 days starting from 09/15/2020 thereafter stop Brilinta and add Plavix to aspirin indefinitely, needs continued aggressive PT OT to regain right-sided function will benefit from CIR/rehab.      2. Incidental Thyroid nodules measuring up to 1.5 cm. - outpt PCP directed Thyroid US   3.  Ongoing smoking.  Strictly counseled to quit.  He has been abstaining for the last 3 weeks.   4.  COPD.  Stable supportive care.   5.  Hypertension.  Permissive hypertension due to stroke.   6.  AKI on CKD stage IIIa.  Baseline creatinine around 2, improving with hydration.   7.  OSA - CPAP QHS   8. DM type II.  On sliding scale, monitor and adjust as needed .  9.  Hypertension.  Permissive hypertension be very careful  in dropping his blood pressure systolic below [id number] as due to his diffuse atherosclerosis he might extend his stroke further.   Discharge diagnosis     Active Problems:   HTN (hypertension)   DM type 2 (diabetes mellitus, type 2) (HCC)   Chronic diastolic CHF (congestive heart failure) (HCC)   CKD (chronic kidney disease), stage III (HCC)   Chronic obstructive pulmonary disease (HCC)   Acute CVA (cerebrovascular accident) [facility])    Discharge instructions    Discharge Instructions     Discharge instructions   Complete by: As directed    Follow with Primary MD Rosita Fire, MD in 7 days   Get CBC, CMP, 2 view Chest X ray -  checked next visit within 1 week by Primary MD or CIR MD    Activity: As tolerated with Full fall precautions use walker/cane & assistance as needed  Disposition CIR  Diet: Heart Healthy low carbohydrate, check CBGs q. [facility]  Special Instructions: If you have smoked or chewed Tobacco  in the last 2 yrs please stop smoking, stop any regular Alcohol  and or any Recreational drug use.  On your next visit with your primary  care physician please Get Medicines reviewed and adjusted.  Please request your Prim.MD to go over all Hospital Tests and Procedure/Radiological results at the follow up, please get all Hospital records sent to your Prim MD by signing hospital release before you go home.  If you experience worsening of your admission symptoms, develop shortness of breath, life threatening emergency, suicidal or homicidal thoughts you must seek medical attention immediately by calling 911 or calling your MD immediately  if symptoms less severe.  You Must read complete instructions/literature along with all the possible adverse reactions/side effects for all the Medicines you take and that have been prescribed to you. Take any new Medicines after you have completely understood and accpet all the possible adverse reactions/side effects.   Increase activity slowly   Complete by: As directed        Discharge Medications   Allergies as of 09/19/2020       Reactions   Dust Mite Extract    Sneezing   Lisinopril Cough        Medication List     STOP taking these medications    clopidogrel 75 MG tablet Commonly known as: PLAVIX   glipiZIDE 5 MG tablet Commonly known as: Glucotrol   hydrALAZINE 50 MG tablet Commonly known as: APRESOLINE   isosorbide mononitrate 60 MG 24 hr tablet Commonly known as: IMDUR   losartan 100 MG tablet Commonly known as: COZAAR   metFORMIN 500 MG tablet Commonly known as: GLUCOPHAGE       TAKE these medications    albuterol 108 (90 Base) MCG/ACT inhaler Commonly known as: VENTOLIN HFA Inhale 2 puffs into the lungs every 6 (six) hours as needed for wheezing or shortness of breath.   aspirin EC 81 MG tablet Take 1 tablet (81 mg total) by mouth daily.   atorvastatin 80 MG tablet Commonly known as: Lipitor Take 1 tablet (80 mg total) by mouth daily.   chlorthalidone 25 MG tablet Commonly known as: HYGROTON Take 1 tablet (25 mg total) by mouth daily.    gabapentin 300 MG capsule Commonly known as: NEURONTIN Take 300 mg by mouth at bedtime.   insulin lispro 100 UNIT/ML KwikPen Commonly known as: HumaLOG KwikPen Before each meal 3 times a day, 140-199 - 2 units, 200-250 - 4 units, 251-299 -  6 units,  300-349 - 8 units,  350 or above 10 units. Insulin PEN if approved, provide syringes and needles if needed.   ipratropium-albuterol 0.5-2.5 (3) MG/3ML Soln Commonly known as: DUONEB Take 3 mLs by nebulization every 4 (four) hours as needed.   nitroGLYCERIN 0.4 MG SL tablet Commonly known as: NITROSTAT Place 1 tablet (0.4 mg total) under the tongue every 5 (five) minutes as needed for chest pain.   NON FORMULARY Pt uses cpap nightly   ticagrelor 90 MG Tabs tablet Commonly known as: BRILINTA Take 1 tablet (90 mg total) by mouth 2 (two) times daily. Start Plavix when you complete this for one month   traZODone 100 MG tablet Commonly known as: DESYREL Take 100 mg by mouth at bedtime.   Trelegy Ellipta 100-62.5-25 MCG/INH Aepb Generic drug: Fluticasone-Umeclidin-Vilant Inhale 1 puff into the lungs daily.         Follow-up Information     Frann Rider, NP. Go on 10/12/2020.   Specialty: Neurology Why: stroke clinic Contact information: [address]  [place] [address] [phone]         Rosita Fire, MD .   Specialty: Internal Medicine Contact information: [place] [address] [phone]         Pixie Casino, MD .   Specialty: Cardiology Contact information: [address] [place] [address] [phone]                 Major procedures and Radiology Reports - PLEASE review detailed and final reports thoroughly  -       CT Angio Head W or Wo Contrast  Result Date: 09/06/2020 CLINICAL DATA:  Right-sided weakness, left-sided vision impairment. EXAM: CT ANGIOGRAPHY HEAD AND NECK TECHNIQUE: Multidetector CT imaging of the head and neck was  performed using the standard protocol during bolus administration of intravenous contrast. Multiplanar CT image reconstructions and MIPs were obtained to evaluate the vascular anatomy. Carotid stenosis measurements (when applicable) are obtained utilizing NASCET criteria, using the distal internal carotid diameter as the denominator. CONTRAST:  69m OMNIPAQUE IOHEXOL 350 MG/ML SOLN COMPARISON:  CT angiogram of the head and neck November 23, 2018. FINDINGS: CTA NECK FINDINGS Aortic arch: Common origin of the innominate and left common carotid artery from the aortic arch. Imaged portion shows no evidence of aneurysm or dissection. No significant stenosis of the major arch vessel origins. Right carotid system: Increased tortuosity of the cervical segment of the right ICA. No significant stenosis. Left carotid system: Mild atherosclerotic changes of the left carotid bifurcation without hemodynamically significant stenosis. Increased tortuosity of the cervical segment of the left ICA. Vertebral arteries: Codominant. No evidence of dissection, stenosis (50% or greater) or occlusion. Skeleton: No acute osseous abnormality. Periapical lucency at the right maxillary first molar tooth. Other neck: Enlarged thyroid gland with multiple hypodense nodules, the largest measuring up to 1.5 cm. Upper chest: Negative. Review of the MIP images confirms the above findings CTA HEAD FINDINGS Anterior circulation: Mild atherosclerotic changes of the bilateral carotid siphon without stenosis. Luminal irregularity seen along the bilateral MCA vascular trees with focus of moderate stenosis at a left M3/MCA superior division branch and mild stenosis at a right M3/MCA inferior division branch. There is also luminal irregularity in the bilateral ACA vascular trees with focal area of moderate stenosis at the right A3/ACA segment. Fenestration of the left A1/ACA again noted. Posterior circulation: Atherosclerotic changes of the bilateral  intracranial vertebral arteries with moderate stenosis on  the left. No significant stenosis on the right. The basilar artery has normal course and caliber. There is an occlusion of the right PCA at the P1-P2 junction with poor distal flow. Atherosclerotic changes of the left posterior cerebral artery with mild-to-moderate stenosis at the P2 segment. Venous sinuses: As permitted by contrast timing, patent. Anatomic variants: Fenestration of the left A1/ACA. Review of the MIP images confirms the above findings IMPRESSION: 1. Positive for large vessel occlusion at the right P1-P2/PCA junction. 2. Intracranial atherosclerotic disease with moderate stenosis at a left M3/MCA superior division branch, mild stenosis at a right M3/MCA inferior division branch, moderate stenosis at the right A3/ACA segment, moderate stenosis at the left vertebral artery and mild-to-moderate stenosis at a left P2/PCA. 3. No significant atherosclerotic disease in the neck. These results were called by telephone at the time of interpretation on 09/06/2020 at 5:19 pm to Dr. Erlinda Hong, who verbally acknowledged these results. Electronically Signed   By: Pedro Earls M.D.   On: 09/06/2020 17:42   DG Chest 2 View  Result Date: 09/13/2020 CLINICAL DATA:  Shortness of breath.  Right-sided weakness. EXAM: CHEST - 2 VIEW COMPARISON:  Radiograph 09/29/2018, chest CT 06/27/2020 FINDINGS: Lung volumes are low. Mild cardiomegaly. Bandlike scarring at the left lung base. No acute or confluent airspace disease. No pulmonary edema. No pleural effusion or pneumothorax. No acute osseous abnormalities are seen. IMPRESSION: Low lung volumes with mild cardiomegaly and left basilar scarring. Electronically Signed   By: Keith Rake M.D.   On: 09/13/2020 19:57   CT Head Wo Contrast  Result Date: 09/13/2020 CLINICAL DATA:  Right leg weakness EXAM: CT HEAD WITHOUT CONTRAST TECHNIQUE: Contiguous axial images were obtained from the base of the skull  through the vertex without intravenous contrast. COMPARISON:  MRI 09/07/2020, CT 09/06/2020 FINDINGS: Brain: Mild residual hypodensity within the right occipital and posterior temporal lobes as well as splenium corpus callosum consistent with evolving right PCA infarct. No definitive hemorrhage seen. Hypodensity in the left pons, also consistent with evolving infarct. Negative for intracranial mass. Chronic left frontal lobe infarct. Chronic bilateral white matter and basal ganglial infarcts. Chronic lacunar infarcts in the thalamus. Stable ventricle size. Chronic small vessel ischemic changes of the white matter. Vascular: No hyperdense vessels. Mild carotid vascular calcification. Vertebral artery calcification Skull: Normal. Negative for fracture or focal lesion. Sinuses/Orbits: Mucosal thickening in the sinuses Other: None IMPRESSION: 1. Evolving right PCA infarct without interval hemorrhage. Evolving left pontine infarct without interval hemorrhage. No definite new acute intracranial abnormality since prior study from 09/06/2020. 2. Atrophy with chronic small vessel ischemic changes of the white matter. Multiple chronic infarcts involving left frontal lobe, bilateral white matter, basal ganglia and thalamus. Electronically Signed   By: Donavan Foil M.D.   On: 09/13/2020 20:24   CT Angio Neck W and/or Wo Contrast  Result Date: 09/06/2020 CLINICAL DATA:  Right-sided weakness, left-sided vision impairment. EXAM: CT ANGIOGRAPHY HEAD AND NECK TECHNIQUE: Multidetector CT imaging of the head and neck was performed using the standard protocol during bolus administration of intravenous contrast. Multiplanar CT image reconstructions and MIPs were obtained to evaluate the vascular anatomy. Carotid stenosis measurements (when applicable) are obtained utilizing NASCET criteria, using the distal internal carotid diameter as the denominator. CONTRAST:  39m OMNIPAQUE IOHEXOL 350 MG/ML SOLN COMPARISON:  CT angiogram of  the head and neck November 23, 2018. FINDINGS: CTA NECK FINDINGS Aortic arch: Common origin of the innominate and left common carotid artery from the aortic arch.  Imaged portion shows no evidence of aneurysm or dissection. No significant stenosis of the major arch vessel origins. Right carotid system: Increased tortuosity of the cervical segment of the right ICA. No significant stenosis. Left carotid system: Mild atherosclerotic changes of the left carotid bifurcation without hemodynamically significant stenosis. Increased tortuosity of the cervical segment of the left ICA. Vertebral arteries: Codominant. No evidence of dissection, stenosis (50% or greater) or occlusion. Skeleton: No acute osseous abnormality. Periapical lucency at the right maxillary first molar tooth. Other neck: Enlarged thyroid gland with multiple hypodense nodules, the largest measuring up to 1.5 cm. Upper chest: Negative. Review of the MIP images confirms the above findings CTA HEAD FINDINGS Anterior circulation: Mild atherosclerotic changes of the bilateral carotid siphon without stenosis. Luminal irregularity seen along the bilateral MCA vascular trees with focus of moderate stenosis at a left M3/MCA superior division branch and mild stenosis at a right M3/MCA inferior division branch. There is also luminal irregularity in the bilateral ACA vascular trees with focal area of moderate stenosis at the right A3/ACA segment. Fenestration of the left A1/ACA again noted. Posterior circulation: Atherosclerotic changes of the bilateral intracranial vertebral arteries with moderate stenosis on the left. No significant stenosis on the right. The basilar artery has normal course and caliber. There is an occlusion of the right PCA at the P1-P2 junction with poor distal flow. Atherosclerotic changes of the left posterior cerebral artery with mild-to-moderate stenosis at the P2 segment. Venous sinuses: As permitted by contrast timing, patent. Anatomic  variants: Fenestration of the left A1/ACA. Review of the MIP images confirms the above findings IMPRESSION: 1. Positive for large vessel occlusion at the right P1-P2/PCA junction. 2. Intracranial atherosclerotic disease with moderate stenosis at a left M3/MCA superior division branch, mild stenosis at a right M3/MCA inferior division branch, moderate stenosis at the right A3/ACA segment, moderate stenosis at the left vertebral artery and mild-to-moderate stenosis at a left P2/PCA. 3. No significant atherosclerotic disease in the neck. These results were called by telephone at the time of interpretation on 09/06/2020 at 5:19 pm to Dr. Erlinda Hong, who verbally acknowledged these results. Electronically Signed   By: Pedro Earls M.D.   On: 09/06/2020 17:42   MR ANGIO HEAD WO CONTRAST  Result Date: 09/15/2020 CLINICAL DATA:  Stroke EXAM: MRA HEAD WITHOUT CONTRAST TECHNIQUE: Angiographic images of the Circle of Willis were acquired using MRA technique without intravenous contrast. COMPARISON:  MRI head 09/15/2020.  CT a 09/06/2020 FINDINGS: Anterior circulation: Motion degraded study. This significantly limits evaluation for small vessel disease. Internal carotid artery patent bilaterally without significant stenosis. Irregular areas of stenosis in the anterior and middle cerebral artery branches bilaterally consistent with widespread atherosclerotic disease. This is confirmed on the prior CTA. No large vessel occlusion. Posterior circulation: Both vertebral arteries are patent to the basilar. Basilar patent. Superior cerebellar arteries patent bilaterally. Occlusion of the proximal right posterior cerebral artery corresponding to right PCA infarct on MRI. There is severe stenosis of the left posterior cerebral artery which has distal flow. This appears progressive from the prior CT angiogram. Differences could be due to artifact from motion. Anatomic variants: None Other: None IMPRESSION: Motion degraded  study Diffuse intracranial atherosclerotic disease, moderate to severe Occlusion right PCA. Severe stenosis left PCA with distal flow. This may have progressed since prior studies however is difficult to compare given the amount of motion on the MRA. Electronically Signed   By: Franchot Gallo M.D.   On: 09/15/2020 16:32  MR BRAIN WO CONTRAST  Result Date: 09/17/2020 CLINICAL DATA:  Stroke follow-up EXAM: MRI HEAD WITHOUT CONTRAST TECHNIQUE: Multiplanar, multiecho pulse sequences of the brain and surrounding structures were obtained without intravenous contrast. COMPARISON:  Two days ago FINDINGS: Brain: Acute left perforator infarct affecting the corona radiata and caudate body. More weakly restricted diffusion in the right PCA territory at the occipital lobe and right posterior corpus callosum. Subacute perforator infarct at the left pons. No interval infarction is seen. No hemorrhagic complication. Remote left frontal operculum infarct. Chronic small vessel ischemia and lacunar infarcts. Vascular: No available T2 weighted imaging Skull and upper cervical spine: Negative Sinuses/Orbits: Negative Other: Progressively motion degraded. Truncated study per clinical request. IMPRESSION: Unchanged acute and subacute infarcts involving the infra and supratentorial brain. Electronically Signed   By: Monte Fantasia M.D.   On: 09/17/2020 06:17   MR BRAIN WO CONTRAST  Result Date: 09/15/2020 CLINICAL DATA:  Transient ischemic attack (TIA). Right leg weakness. EXAM: MRI HEAD WITHOUT CONTRAST TECHNIQUE: Multiplanar, multiecho pulse sequences of the brain and surrounding structures were obtained without intravenous contrast. COMPARISON:  Head CT 09/13/2020 and MRI 09/07/2020 FINDINGS: Brain: There is a new acute left lateral lenticulostriate territory infarct extending from the posterior aspect of the left lentiform nucleus to the caudate body and involving the intervening deep white matter. There is residual diffusion  weighted signal abnormality at the sites of the posterior circulation infarcts on the prior MRI (right temporal and occipital lobes, splenium of the corpus callosum, and left pons) without evidence of significant interval infarct extension. There is minimal petechial hemorrhage in the right occipital lobe. A chronic microhemorrhage is noted in the anterior right frontal lobe. Chronic lacunar infarcts are again noted involving the deep cerebral white matter bilaterally, basal ganglia, and thalami, and there is a chronic moderate-sized left MCA infarct near the anterior aspect of the left frontal operculum. T2 hyperintensities elsewhere in the cerebral white matter bilaterally are unchanged and nonspecific but compatible with moderate chronic small vessel ischemic disease. No mass, midline shift, or extra-axial fluid collection is identified. There is mild cerebral atrophy. Vascular: Major intracranial vascular flow voids are preserved. Skull and upper cervical spine: Unremarkable bone marrow signal. Sinuses/Orbits: Unremarkable orbits. Minimal mucosal thickening or small mucous retention cyst in left sphenoid sinus. Small right and moderate left mastoid effusions. Other: None. IMPRESSION: 1. Acute left basal ganglia infarct. 2. Evolving subacute posterior circulation infarcts without extension from the 09/07/2020 MRI. 3. 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 Electronically Signed   By: Logan Bores M.D.   On: 09/15/2020 14:51   MR BRAIN WO CONTRAST  Result Date: 09/07/2020 CLINICAL DATA:  Stroke, follow-up.  Slurred speech. EXAM: MRI HEAD WITHOUT CONTRAST TECHNIQUE: Multiplanar, multiecho pulse sequences of the brain and surrounding structures were obtained without intravenous contrast. COMPARISON:  CT code stroke 09/06/2020. FINDINGS: Brain: Acute right PCA territory infarcts involving the right occipital lobe, parahippocampal/medial right temporal lobe, and splenium of the  corpus callosum. Punctate acute infarct in the dorsal lateral. Right thalamus. There is associated edema and regional mass effect without midline shift. Additional left pontine acute infarct. No evidence of hemorrhagic transformation. Remote left frontal infarct with encephalomalacia and gliosis. Remote lacunar infarcts in bilateral basal ganglia,, corona radiata and thalami. Additional moderate scattered T2/FLAIR hyperintensities within the white matter, most likely related to chronic microvascular ischemic disease. Atrophy. Similar mild ventriculomegaly. Mild crowding of sulci at the vertex. No mass lesion. No midline shift. Basal cisterns are  patent. No extra-axial fluid collection. Vascular: See recent CTA for further evaluation. Skull and upper cervical spine: Normal marrow signal. Sinuses/Orbits: Left sphenoid sinus retention cyst. Mild ethmoid air cell mucosal thickening. No acute orbital findings. Other: Moderate bilateral mastoid effusions. IMPRESSION: 1. Acute right PCA territory infarcts, as described above. 2. Acute left pontine infarct. 3. Associated edema and regional mass effect without midline shift. No hemorrhagic transformation. 4. Remote left frontal infarct and remote lacunar infarcts in bilateral basal ganglia,, corona radiata and thalami. Moderate chronic microvascular ischemic disease. 5. Similar mild ventriculomegaly, most likely related to atrophy. Normal pressure hydrocephalus is thought less likely, but is a differential consideration. Electronically Signed   By: Margaretha Sheffield MD   On: 09/07/2020 09:22   DG Chest Port 1 View  Result Date: 09/15/2020 CLINICAL DATA:  Pt has history of 4 previous strokes since Dec. Pt typically has SOB, but worsened yesterday. Hx of COPD, diabetes, HTN. Current smoker. EXAM: PORTABLE CHEST 1 VIEW COMPARISON:  Chest x-ray 09/13/2020, CT chest 06/27/2020 FINDINGS: Enlarged cardiac silhouette. The heart size and mediastinal contours are unchanged. No  focal consolidation. No pulmonary edema. No pleural effusion. No pneumothorax. No acute osseous abnormality. IMPRESSION: No active disease. Electronically Signed   By: Iven Finn M.D.   On: 09/15/2020 15:32   ECHOCARDIOGRAM COMPLETE  Result Date: 09/07/2020    ECHOCARDIOGRAM REPORT   Patient Name:   RYLER HEMPHILL Date of Exam: 09/07/2020 Medical Rec #:  [id number]       Height:       67.0 in Accession #:    [id number]      Weight:       230.0 lb Date of Birth:  [birth year]       BSA:          2.146 m Patient Age:    55 years        BP:           130/92 mmHg Patient Gender: M               HR:           62 bpm. Exam Location:  [facility] Procedure: 2D Echo, Cardiac Doppler and Color Doppler Indications:    Stroke  History:        Patient has prior history of Echocardiogram examinations, most                 recent 03/17/2017. CHF, Stroke and COPD, Signs/Symptoms:Shortness                 of Breath and Chest Pain; Risk Factors:Hypertension, Diabetes                 and Current Smoker.  Sonographer:    Wenda Low Referring Phys: [place]  1. Left ventricular ejection fraction, by estimation, is 60 to 65%. The left ventricle has normal function. The left ventricle has no regional wall motion abnormalities. There is severe left ventricular hypertrophy. Left ventricular diastolic parameters  are consistent with Grade I diastolic dysfunction (impaired relaxation).  2. Right ventricular systolic function is normal. The right ventricular size is normal. Tricuspid regurgitation signal is inadequate for assessing PA pressure.  3. The mitral valve is normal in structure. No evidence of mitral valve regurgitation. No evidence of mitral stenosis.  4. The aortic valve has an indeterminant number of cusps. Aortic valve regurgitation is not visualized. No aortic stenosis is present. FINDINGS  Left Ventricle: Left  ventricular ejection fraction, by estimation, is 60 to 65%. The left ventricle  has normal function. The left ventricle has no regional wall motion abnormalities. The left ventricular internal cavity size was normal in size. There is  severe left ventricular hypertrophy. Left ventricular diastolic parameters are consistent with Grade I diastolic dysfunction (impaired relaxation). Normal left ventricular filling pressure. Right Ventricle: The right ventricular size is normal. No increase in right ventricular wall thickness. Right ventricular systolic function is normal. Tricuspid regurgitation signal is inadequate for assessing PA pressure. Left Atrium: Left atrial size was normal in size. Right Atrium: Right atrial size was normal in size. Pericardium: There is no evidence of pericardial effusion. Mitral Valve: The mitral valve is normal in structure. No evidence of mitral valve regurgitation. No evidence of mitral valve stenosis. MV peak gradient, 3.7 mmHg. The mean mitral valve gradient is 1.0 mmHg. Tricuspid Valve: The tricuspid valve is normal in structure. Tricuspid valve regurgitation is not demonstrated. No evidence of tricuspid stenosis. Aortic Valve: The aortic valve has an indeterminant number of cusps. Aortic valve regurgitation is not visualized. No aortic stenosis is present. Aortic valve mean gradient measures 4.0 mmHg. Aortic valve peak gradient measures 6.9 mmHg. Aortic valve area, by VTI measures 2.38 cm. Pulmonic Valve: The pulmonic valve was not well visualized. Pulmonic valve regurgitation is not visualized. No evidence of pulmonic stenosis. Aorta: The aortic root is normal in size and structure. Venous: The inferior vena cava was not well visualized. IAS/Shunts: No atrial level shunt detected by color flow Doppler.  LEFT VENTRICLE PLAX 2D LVIDd:         4.50 cm  Diastology LVIDs:         2.80 cm  LV e' medial:    6.05 cm/s LV PW:         1.93 cm  LV E/e' medial:  9.9 LV IVS:        1.94 cm  LV e' lateral:   4.88 cm/s LVOT diam:     2.00 cm  LV E/e' lateral: 12.3 LV SV:          68 LV SV Index:   32 LVOT Area:     3.14 cm  LEFT ATRIUM             Index       RIGHT ATRIUM           Index LA diam:        4.60 cm 2.14 cm/m  RA Area:     15.50 cm LA Vol (A2C):   28.4 ml 13.23 ml/m RA Volume:   39.20 ml  18.27 ml/m LA Vol (A4C):   48.8 ml 22.74 ml/m LA Biplane Vol: 36.9 ml 17.19 ml/m  AORTIC VALVE AV Area (Vmax):    3.09 cm AV Area (Vmean):   3.16 cm AV Area (VTI):     2.38 cm AV Vmax:           131.00 cm/s AV Vmean:          88.600 cm/s AV VTI:            0.285 m AV Peak Grad:      6.9 mmHg AV Mean Grad:      4.0 mmHg LVOT Vmax:         129.00 cm/s LVOT Vmean:        89.200 cm/s LVOT VTI:          0.216 m LVOT/AV VTI ratio: 0.76  AORTA Ao  Root diam: 3.85 cm Ao Asc diam:  3.40 cm MITRAL VALVE MV Area (PHT): 2.76 cm    SHUNTS MV Area VTI:   1.90 cm    Systemic VTI:  0.22 m MV Peak grad:  3.7 mmHg    Systemic Diam: 2.00 cm MV Mean grad:  1.0 mmHg MV Vmax:       0.96 m/s MV Vmean:      45.1 cm/s MV Decel Time: 275 msec MV E velocity: 59.90 cm/s MV A velocity: 86.70 cm/s MV E/A ratio:  0.69 Carlyle Dolly MD Electronically signed by Carlyle Dolly MD Signature Date/Time: 09/07/2020/2:40:42 PM    Final    ECHO TEE  Result Date: 09/18/2020    TRANSESOPHOGEAL ECHO REPORT   Patient Name:   [name] Date of Exam: 09/18/2020 Medical Rec #:  [id number]       Height:       67.0 in Accession #:    [id number]      Weight:       225.0 lb Date of Birth:  Dec 22, 1965       BSA:          2.126 m Patient Age:    80 years        BP:           122/64 mmHg Patient Gender: M               HR:           78 bpm. Exam Location:  Inpatient Procedure: Transesophageal Echo, Cardiac Doppler, Color Doppler and Saline            Contrast Bubble Study Indications:     CVA  History:         Patient has prior history of Echocardiogram examinations, most                  recent 09/07/2020. CHF, COPD and Stroke, Signs/Symptoms:CKD;                  Risk Factors:Hypertension, Diabetes and Current Smoker.   Sonographer:     Dustin Flock Referring Phys:  Lyman Bishop MD Diagnosing Phys: Lyman Bishop MD PROCEDURE: The transesophogeal probe was passed without difficulty through the esophogus of the patient. Sedation performed by different physician. The patient was monitored while under deep sedation. Anesthestetic sedation was provided intravenously by Anesthesiology: 390.'09mg'$  of Propofol. The patient developed no complications during the procedure. IMPRESSIONS  1. Left ventricular ejection fraction, by estimation, is 60 to 65%. The left ventricle has normal function. The left ventricle has no regional wall motion abnormalities. There is severe left ventricular hypertrophy.  2. Right ventricular systolic function is normal. The right ventricular size is normal.  3. No left atrial/left atrial appendage thrombus was detected.  4. The mitral valve is grossly normal. Trivial mitral valve regurgitation.  5. The aortic valve is tricuspid. Aortic valve regurgitation is mild. Conclusion(s)/Recommendation(s): No LA/LAA thrombus identified. Negative bubble study for interatrial shunt. No intracardiac source of embolism detected on this on this transesophageal echocardiogram. FINDINGS  Left Ventricle: Left ventricular ejection fraction, by estimation, is 60 to 65%. The left ventricle has normal function. The left ventricle has no regional wall motion abnormalities. The left ventricular internal cavity size was normal in size. There is  severe left ventricular hypertrophy. Right Ventricle: The right ventricular size is normal. No increase in right ventricular wall thickness. Right ventricular systolic function is normal. Left Atrium: Left atrial size was  normal in size. No left atrial/left atrial appendage thrombus was detected. Right Atrium: Right atrial size was normal in size. Pericardium: There is no evidence of pericardial effusion. Mitral Valve: The mitral valve is grossly normal. Trivial mitral valve regurgitation.  Tricuspid Valve: The tricuspid valve is grossly normal. Tricuspid valve regurgitation is trivial. Aortic Valve: The aortic valve is tricuspid. Aortic valve regurgitation is mild. Pulmonic Valve: The pulmonic valve was grossly normal. Pulmonic valve regurgitation is trivial. Aorta: The aortic root and ascending aorta are structurally normal, with no evidence of dilitation. IAS/Shunts: No atrial level shunt detected by color flow Doppler. Agitated saline contrast was given intravenously to evaluate for intracardiac shunting. Lyman Bishop MD Electronically signed by Lyman Bishop MD Signature Date/Time: 09/18/2020/2:16:58 PM    Final    CT HEAD CODE STROKE WO CONTRAST  Result Date: 09/06/2020 CLINICAL DATA:  Code stroke.  Neuro deficit, acute stroke suspected. EXAM: CT HEAD WITHOUT CONTRAST TECHNIQUE: Contiguous axial images were obtained from the base of the skull through the vertex without intravenous contrast. COMPARISON:  CT head 11/22/2018. FINDINGS: Brain: Acute right PCA territory infarcts, including the right occipital lobe, right mesial temporal lobe and potentially the right thalamus and right aspect of the splenium of the corpus callosum. Associated edema and local mass effect. No midline shift. Mild hyperdensity in the right occipital lobe. Remote infarcts in the left frontal lobe with encephalomalacia. Remote infarcts in bilateral corona radiata and basal ganglia. Additional moderate patchy white matter hypoattenuation, most likely chronic microvascular ischemic disease. Similar atrophy. Similar mild ventriculomegaly, mildly disproportionate to the degree of sulcal enlargement. Additionally there is similar crowding of sulci at the vertex and a mildly acute callosal angle. Vascular: Subtle hyperdensity within the right PCA (series 5, image 36; series 6, image 30), potentially thrombus. Intracranial atherosclerosis. Skull: No acute fracture. Sinuses/Orbits: Sinuses are largely clear.  Unremarkable  orbits. Other: No mastoid effusions. IMPRESSION: 1. Acute right PCA territory infarcts, including the right occipital lobe, right mesial/parahippocampal temporal lobe and potentially the right thalamus and right aspect of the splenium of the corpus callosum. Associated edema and local mass effect without midline shift. 2. Mild hyperdensity in the right occipital lobe may represent spared cortex and/or petechial hemorrhage and warrants attention on follow-up. 3. Subtle hyperdensity within the right PCA (series 5, image 36; series 6, image 30), concerning for thrombus given the above findings. Recommend CTA. 4. Remote infarcts in the left frontal lobe and remote lacunar infarcts in bilateral corona radiata and basal ganglia. 5. Moderate chronic microvascular ischemic disease. 6. Similar mild ventriculomegaly, mildly disproportionate to the degree of sulcal enlargement. Findings could be secondary to central predominant volume loss versus normal pressure hydrocephalus. Code stroke imaging results were communicated on 09/06/2020 at 4:46 pm to provider Dr. Laverta Baltimore via telephone, who verbally acknowledged these results. Electronically Signed   By: Margaretha Sheffield MD   On: 09/06/2020 16:59   CT ANGIO HEAD NECK W WO CM W PERF  Result Date: 09/16/2020 CLINICAL DATA:  Stroke follow-up.  Right arm numbness. EXAM: CT ANGIOGRAPHY HEAD AND NECK CT PERFUSION BRAIN TECHNIQUE: Multidetector CT imaging of the head and neck was performed using the standard protocol during bolus administration of intravenous contrast. Multiplanar CT image reconstructions and MIPs were obtained to evaluate the vascular anatomy. Carotid stenosis measurements (when applicable) are obtained utilizing NASCET criteria, using the distal internal carotid diameter as the denominator. Multiphase CT imaging of the brain was performed following IV bolus contrast injection. Subsequent parametric perfusion maps were  calculated using RAPID software. CONTRAST:   122m OMNIPAQUE IOHEXOL 350 MG/ML SOLN COMPARISON:  Head MRI and MRA 09/15/2020. Head and neck CTA 09/06/2020. FINDINGS: CT HEAD FINDINGS Brain: Hypodensity in the posterior left basal ganglia/posterior limb of internal capsule/corona radiata corresponds to the acute infarct which is better demonstrated on yesterday's MRI. No acute cortically based infarct, mass, midline shift, or extra-axial fluid collection is identified. Multiple subacute posterior circulation infarcts are again noted involving the posteromedial right temporal and occipital lobes, splenium of the corpus callosum, and left pons with minimal petechial hemorrhage again noted in the right occipital lobe. There is a chronic moderate-sized left frontal lobe MCA infarct, and there are moderate chronic small-vessel changes in the cerebral white matter with chronic lacunar infarcts in the deep cerebral white matter bilaterally, basal ganglia, and thalami. There is mild cerebral atrophy. Vascular: No hyperdense vessel. Skull: No fracture or suspicious osseous lesion. Sinuses/Orbits: Small mucous retention cyst in the left sphenoid sinus. Small to moderate mastoid effusions. Unremarkable orbits. Other: None. CTA NECK FINDINGS Aortic arch: Normal variant aortic arch branching pattern with common origin of the brachiocephalic and left common carotid arteries. Widely patent arch vessel origins. Right carotid system: Patent without evidence of stenosis, dissection, or significant atherosclerosis. Tortuous cervical ICA. Left carotid system: Patent with a small amount of calcified plaque at the carotid bifurcation. No evidence of a significant stenosis or dissection. Tortuous cervical ICA. Vertebral arteries: Patent and codominant without evidence of a significant stenosis or dissection. Skeleton: Mild cervical spondylosis. Other neck: Bilateral thyroid nodules measuring up to 1.5 cm. No evidence of cervical lymphadenopathy. Upper chest: Clear lung apices. Review  of the MIP images confirms the above findings CTA HEAD FINDINGS Anterior circulation: The internal carotid arteries are patent from skull base to carotid termini with mild atherosclerosis not resulting in a significant stenosis. ACAs and MCAs are patent without evidence of a proximal branch occlusion. There is widespread atherosclerotic type irregularity including an unchanged mild proximal left M1 stenosis. Severe left A3 stenoses appear increased from the prior CTA. No aneurysm is identified. Posterior circulation: The intracranial vertebral arteries are patent to the basilar with atherosclerotic plaque resulting in an unchanged moderate stenosis on the left. The a severe proximal right AICA stenosis is again seen. Patent SCA is are identified bilaterally. The basilar artery is widely patent. There is a small left posterior communicating artery. There is unchanged occlusion of the right PCA near the P1-P2 junction. The left PCA is patent but diffusely irregular with a severe stenosis in the proximal P2 segment which appears increased from the prior CTA but less extensive than what was suggested on the more recent MRA. No aneurysm is identified. Venous sinuses: Hypoplastic and poorly visualized right transverse and sigmoid sinuses, unchanged. Other major dural venous sinuses are patent. Anatomic variants: Left A1 fenestration. Review of the MIP images confirms the above findings CT Brain Perfusion Findings: CBF (<30%) Volume: 0 mL Perfusion (Tmax>6.0s) volume: 45 mL Mismatch Volume: 45 mL Infarction Location: No acute core infarct is identified by automated processing. The region of delayed perfusion corresponds to the subacute right PCA infarct. IMPRESSION: CT HEAD: 1. Known acute left basal ganglia infarct and multiple subacute posterior circulation infarcts as shown on yesterday's MRI. 2. No definite new infarct or intracranial hemorrhage. 3. Chronic ischemia with multiple old infarcts as above. CTA HEAD AND  NECK: 1. No emergent large vessel occlusion. 2. Unchanged right PCA occlusion. 3. Advanced intracranial atherosclerosis including severe proximal left PCA and left  A3 stenoses which appear increased from the prior CTA. 4. Widely patent cervical carotid arteries. 5. Thyroid nodules measuring up to 1.5 cm. Recommend thyroid US (ref: J Am Coll Radiol. 2015 Feb;12(2): 143-50). CT PERFUSION: No evidence of acute core infarct or penumbra identified by automated processing. Electronically Signed   By: Logan Bores M.D.   On: 09/16/2020 10:32    Micro Results     Recent Results (from the past 240 hour(s))  Resp Panel by RT-PCR (Flu A&B, Covid) Nasopharyngeal Swab     Status: None   Collection Time: 09/15/20  2:00 PM   Specimen: Nasopharyngeal Swab; Nasopharyngeal(NP) swabs in vial transport medium  Result Value Ref Range Status   SARS Coronavirus 2 by RT PCR NEGATIVE NEGATIVE Final    Comment: (NOTE) SARS-CoV-2 target nucleic acids are NOT DETECTED.  The SARS-CoV-2 RNA is generally detectable in upper respiratory specimens during the acute phase of infection. The lowest concentration of SARS-CoV-2 viral copies this assay can detect is 138 copies/mL. A negative result does not preclude SARS-Cov-2 infection and should not be used as the sole basis for treatment or other patient management decisions. A negative result may occur with  improper specimen collection/handling, submission of specimen other than nasopharyngeal swab, presence of viral mutation(s) within the areas targeted by this assay, and inadequate number of viral copies(<138 copies/mL). A negative result must be combined with clinical observations, patient history, and epidemiological information. The expected result is Negative.  Fact Sheet for Patients:  EntrepreneurPulse.com.au  Fact Sheet for Healthcare Providers:  IncredibleEmployment.be  This test is no t yet approved or cleared by the  [place] FDA and  has been authorized for detection and/or diagnosis of SARS-CoV-2 by FDA under an Emergency Use Authorization (EUA). This EUA will remain  in effect (meaning this test can be used) for the duration of the COVID-19 declaration under Section 564(b)(1) of the Act, 21 U.S.C.section 360bbb-3(b)(1), unless the authorization is terminated  or revoked sooner.       Influenza A by PCR NEGATIVE NEGATIVE Final   Influenza B by PCR NEGATIVE NEGATIVE Final    Comment: (NOTE) The Xpert Xpress SARS-CoV-2/FLU/RSV plus assay is intended as an aid in the diagnosis of influenza from Nasopharyngeal swab specimens and should not be used as a sole basis for treatment. Nasal washings and aspirates are unacceptable for Xpert Xpress SARS-CoV-2/FLU/RSV testing.  Fact Sheet for Patients: EntrepreneurPulse.com.au  Fact Sheet for Healthcare Providers: IncredibleEmployment.be  This test is not yet approved or cleared by the [place] FDA and has been authorized for detection and/or diagnosis of SARS-CoV-2 by FDA under an Emergency Use Authorization (EUA). This EUA will remain in effect (meaning this test can be used) for the duration of the COVID-19 declaration under Section 564(b)(1) of the Act, 21 U.S.C. section 360bbb-3(b)(1), unless the authorization is terminated or revoked.  Performed at [facility], [address] [place], [place] [address]   Surgical PCR screen     Status: None   Collection Time: 09/18/20  5:01 AM   Specimen: Nasal Mucosa; Nasal Swab  Result Value Ref Range Status   MRSA, PCR NEGATIVE NEGATIVE Final   Staphylococcus aureus NEGATIVE NEGATIVE Final    Comment: (NOTE) The Xpert SA Assay (FDA approved for NASAL specimens in patients 54 years of age and older), is one component of a comprehensive surveillance program. It is not intended to diagnose infection nor to guide or monitor treatment. Performed at [facility], [place]  [address], [place], [place] [address]     Today   Subjective    Craig Knight today has no headache,no chest abdominal pain,no new weakness tingling or numbness, continues to have right-sided weakness both in arms and legs   Objective   Blood pressure (!) 126/100, pulse 65, temperature 98.1 F (36.7 C), temperature source Oral, resp. rate 14, height '5\' 7"'$  (123XX123 m), weight 102.1 kg, SpO2 98 %.   Intake/Output Summary (Last 24 hours) at 09/19/2020 0924 Last data filed at 09/19/2020 0706 Gross per 24 hour  Intake 3576 ml  Output 1650 ml  Net 1926 ml    Exam  Awake Alert, No new F.N deficits, dense R sided hemiparesis .AT,PERRAL Supple Neck,No JVD, No cervical lymphadenopathy appriciated.  Symmetrical Chest wall movement, Good air movement bilaterally, CTAB RRR,No Gallops,Rubs or new Murmurs, No Parasternal Heave +ve B.Sounds, Abd Soft, Non tender, No organomegaly appriciated, No rebound -guarding or rigidity. No Cyanosis, Clubbing or edema, No new Rash or bruise   Data Review   CBC w Diff:  Lab Results  Component Value Date   WBC 8.8 09/18/2020   HGB 14.0 09/18/2020   HCT 43.1 09/18/2020   PLT 212 09/18/2020   LYMPHOPCT 24 09/18/2020   MONOPCT 11 09/18/2020   EOSPCT 3 09/18/2020   BASOPCT 1 09/18/2020    CMP:  Lab Results  Component Value Date   NA 136 09/18/2020   K 3.6 09/18/2020   CL 105 09/18/2020   CO2 24 09/18/2020   BUN 33 (H) 09/18/2020   CREATININE 1.87 (H) 09/18/2020   CREATININE 1.78 (H) 11/26/2016   PROT 6.2 (L) 09/18/2020   ALBUMIN 3.5 09/18/2020   BILITOT 0.5 09/18/2020   ALKPHOS 75 09/18/2020   AST 29 09/18/2020   ALT 20 09/18/2020  .  Lab Results  Component Value Date   CHOL 115 09/16/2020   HDL 33 (L) 09/16/2020   LDLCALC 67 09/16/2020   TRIG 77 09/16/2020   CHOLHDL 3.5 09/16/2020    Lab Results  Component Value Date   HGBA1C 7.4 (H) 09/16/2020    Total Time in preparing paper work, data evaluation and  todays exam - 27 minutes  Lala Lund M.D on 09/19/2020 at 9:24 AM  [facility]

## 2020-09-19 NOTE — Discharge Summary (Signed)
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  Maintained on aspirin 81 mg daily and Brilinta 90 mg twice daily plan for 30 days then aspirin 81 mg daily and Plavix 75 mg daily.  He was discharged home ambulating minimal guard 150 feet without assistive device.  Per chart review patient lives with spouse and daughter.  1 level home with 2 steps to entry.  Presented 09/15/2020 to [facility] with nonspecific shortness of breath and worsening right side weakness.  MRI of the brain showed acute left basal ganglia infarction.  Evolving subacute posterior circulation infarct without extension from 09/07/2020.  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  MRA of the head showed diffuse intracranial atherosclerotic disease.  Occlusion right PCA.  Severe stenosis left PCA with distal flow.  This appeared to possibly have progressed since prior studies however difficult to compare given the amount of motion on the MRA.  CTA head neck no emergent large vessel occlusion.  There was an incidental finding of thyroid nodule 1.5 cm recommendations outpatient follow-up ultrasound.  Echocardiogram with ejection fraction of 60 to 65% no wall motion abnormalities.  TEE completed showing no PFO.  Admission chemistries unremarkable except CO2 of 20 BUN 33 creatinine 2.01, WBC 12,800, troponin 33-50, hemoglobin A1c 7.4.  Currently maintained on aspirin 81 mg daily as well as Brilinta 90 mg twice daily for CVA prophylaxis for  30 days then back to aspirin and Plavix DAPT.  Subcutaneous Lovenox for DVT prophylaxis.  Refused loop recorder placement and ZIO patch added..  Tolerating a regular consistency diet.  Therapy evaluations completed due to patient's increasing right side weakness was admitted for a comprehensive rehab program.   Pt reports no issues swallowing- cannot move R side at all.  Is having issues with dysarthria, not aphasia per pt/wife. Notes LBM was today- is continent of B/B per pt.  Voiding well.  Has had a stutter his whole life. Also normally "shakes his legs " voluntarily.  Denies pain.   Review of Systems  Constitutional:  Negative for chills and fever.  HENT:  Negative for hearing loss.   Eyes:  Negative for blurred vision and double vision.  Respiratory:  Positive for shortness of breath. Negative for cough.   Cardiovascular:  Positive for leg swelling. Negative for chest pain and palpitations.  Gastrointestinal:  Negative for constipation, heartburn, nausea and vomiting.  Genitourinary:  Negative for dysuria, flank pain and hematuria.  Musculoskeletal:  Positive for joint pain and myalgias.  Skin:  Negative for rash.  Neurological:  Positive for speech change and weakness.  Psychiatric/Behavioral:  The patient has insomnia.   All other systems reviewed and are negative. Past Medical History:  Diagnosis Date   Asthma    Bronchitis    COPD (chronic obstructive pulmonary disease) ([place])    Diabetes mellitus without complication ([place])    Heart murmur [id number]   soft systolic murmur 1/6   Hypertension    Sleep apnea    Stroke [facility])    Past Surgical History:  Procedure Laterality Date   COLONOSCOPY N/A 09/18/2016  Procedure: COLONOSCOPY;  Surgeon: Danie Binder, MD;  Location: [facility];  Service: Endoscopy;  Laterality: N/A;  10:30 AM   lipoma removal     Family History  Problem Relation Age of Onset   Stroke Mother    Heart attack Father 64       CABG   Hypertension  Sister    Stroke Brother    Hypertension Brother    Stomach cancer Brother    Stroke Maternal Grandmother    Heart attack Maternal Grandfather    Heart attack Paternal Grandmother    Social History:  reports that he has been smoking cigarettes. He has been smoking an average of .25 packs per day. He has never used smokeless tobacco. He reports current alcohol use. He reports that he does not use drugs. He quit smoking 1 week before stroke- for a possible hernia surgery.  Allergies:  Allergies  Allergen Reactions   Dust Mite Extract     Sneezing   Lisinopril Cough   Medications Prior to Admission  Medication Sig Dispense Refill   albuterol (VENTOLIN HFA) 108 (90 Base) MCG/ACT inhaler Inhale 2 puffs into the lungs every 6 (six) hours as needed for wheezing or shortness of breath. 18 g 8   aspirin EC 81 MG tablet Take 1 tablet (81 mg total) by mouth daily. 30 tablet 0   atorvastatin (LIPITOR) 80 MG tablet Take 1 tablet (80 mg total) by mouth daily. 90 tablet 1   chlorthalidone (HYGROTON) 25 MG tablet Take 1 tablet (25 mg total) by mouth daily. 30 tablet 0   Fluticasone-Umeclidin-Vilant (TRELEGY ELLIPTA) 100-62.5-25 MCG/INH AEPB Inhale 1 puff into the lungs daily. 60 each 8   gabapentin (NEURONTIN) 300 MG capsule Take 300 mg by mouth at bedtime.     insulin lispro (HUMALOG KWIKPEN) 100 UNIT/ML KwikPen Before each meal 3 times a day, 140-199 - 2 units, 200-250 - 4 units, 251-299 - 6 units,  300-349 - 8 units,  350 or above 10 units. Insulin PEN if approved, provide syringes and needles if needed. 15 mL 0   ipratropium-albuterol (DUONEB) 0.5-2.5 (3) MG/3ML SOLN Take 3 mLs by nebulization every 4 (four) hours as needed. 360 mL 6   nitroGLYCERIN (NITROSTAT) 0.4 MG SL tablet Place 1 tablet (0.4 mg total) under the tongue every 5 (five) minutes as needed for chest pain. 30 tablet 0   NON FORMULARY Pt uses cpap nightly     ticagrelor (BRILINTA) 90 MG TABS tablet Take 1 tablet (90 mg total) by mouth  2 (two) times daily. Start Plavix when you complete this for one month 60 tablet 0   traZODone (DESYREL) 100 MG tablet Take 100 mg by mouth at bedtime.      Drug Regimen Review Drug regimen was reviewed and remains appropriate with no significant issues identified  Home: Home Living Family/patient expects to be discharged to:: Private residence   Functional History:    Functional Status:  Mobility:          ADL:    Cognition: Cognition Orientation Level: Oriented X4    Physical Exam: Blood pressure (!) 151/103, pulse 62, temperature 98.5 F (36.9 C), temperature source Oral, resp. rate 18, height '5\' 7"'$  (1.702 m), weight 99.5 kg, SpO2 97 %. Physical Exam Vitals and nursing note reviewed. Exam conducted with a chaperone present.  Constitutional:      Appearance: He is obese.     Comments: Pt sitting up in bed; wife at bedside; legs shaking-  using L to shake R- intermittently/voluntarily; NAD  HENT:     Head: Normocephalic and atraumatic.     Comments: R facial droop- no sensory changes/deficits; R tongue deviation    Right Ear: External ear normal.     Left Ear: External ear normal.     Nose: Nose normal. No congestion.     Mouth/Throat:     Mouth: Mucous membranes are moist.     Pharynx: Oropharynx is clear. No oropharyngeal exudate.  Eyes:     Comments: Little nystagmus to L only- horizontal; wasn't able to look up well on L eye  Cardiovascular:     Rate and Rhythm: Normal rate and regular rhythm.     Heart sounds: Normal heart sounds. No murmur heard.   No gallop.  Pulmonary:     Comments: CTA B/L- no W/R/R- good air movement  Abdominal:     Comments: Soft, NT, ND, (+)BS  - hypoactive  Musculoskeletal:     Cervical back: Normal range of motion and neck supple.     Comments: RUE and RLE 0/5 in all muscles tested- deltoid, biceps, triceps, WE< grip and finger abd as well as HF, KE, KF DF and PF LUE and LLE 5/5 in same muscles tested Kept using L leg to  "move" R leg to shake them  Skin:    Comments: IV R forearm- looks good No skin breakdown seen- heels OK- wasn't able to see buttocks  Neurological:     Mental Status: He is alert.     Comments: Patient is alert makes eye contact with examiner.  Moderate dysarthria but intelligible.  Mild expressive language deficits.  Follows commands.  Provides name and age.  Slurred speech/dysarthria- might be aphasia, but if so, mild- but dysarthria noted Also has stutter which is lifelong- noted No decreased sensation to light touch in all 4 extremities   Psychiatric:        Mood and Affect: Mood normal.        Behavior: Behavior normal.        Thought Content: Thought content normal.        Judgment: Judgment normal.     Comments: Joking some    Results for orders placed or performed during the hospital encounter of 09/19/20 (from the past 48 hour(s))  Glucose, capillary     Status: Abnormal   Collection Time: 09/19/20  4:17 PM  Result Value Ref Range   Glucose-Capillary 119 (H) 70 - 99 mg/dL    Comment: Glucose reference range applies only to samples taken after fasting for at least 8 hours.   ECHO TEE  Result Date: 09/18/2020    TRANSESOPHOGEAL ECHO REPORT   Patient Name:   BRANDOL BRIXEY Date of Exam: 09/18/2020 Medical Rec #:  [id number]       Height:       67.0 in Accession #:    [id number]      Weight:       225.0 lb Date of Birth:  [birth year]       BSA:          2.126 m Patient Age:    47 years        BP:           122/64 mmHg Patient Gender: M               HR:           78 bpm. Exam Location:  Inpatient Procedure: Transesophageal Echo, Cardiac  Doppler, Color Doppler and Saline            Contrast Bubble Study Indications:     CVA  History:         Patient has prior history of Echocardiogram examinations, most                  recent 09/07/2020. CHF, COPD and Stroke, Signs/Symptoms:CKD;                  Risk Factors:Hypertension, Diabetes and Current Smoker.  Sonographer:     Dustin Flock Referring Phys:  Lyman Bishop MD Diagnosing Phys: Lyman Bishop MD PROCEDURE: The transesophogeal probe was passed without difficulty through the esophogus of the patient. Sedation performed by different physician. The patient was monitored while under deep sedation. Anesthestetic sedation was provided intravenously by Anesthesiology: 390.'09mg'$  of Propofol. The patient developed no complications during the procedure. IMPRESSIONS  1. Left ventricular ejection fraction, by estimation, is 60 to 65%. The left ventricle has normal function. The left ventricle has no regional wall motion abnormalities. There is severe left ventricular hypertrophy.  2. Right ventricular systolic function is normal. The right ventricular size is normal.  3. No left atrial/left atrial appendage thrombus was detected.  4. The mitral valve is grossly normal. Trivial mitral valve regurgitation.  5. The aortic valve is tricuspid. Aortic valve regurgitation is mild. Conclusion(s)/Recommendation(s): No LA/LAA thrombus identified. Negative bubble study for interatrial shunt. No intracardiac source of embolism detected on this on this transesophageal echocardiogram. FINDINGS  Left Ventricle: Left ventricular ejection fraction, by estimation, is 60 to 65%. The left ventricle has normal function. The left ventricle has no regional wall motion abnormalities. The left ventricular internal cavity size was normal in size. There is  severe left ventricular hypertrophy. Right Ventricle: The right ventricular size is normal. No increase in right ventricular wall thickness. Right ventricular systolic function is normal. Left Atrium: Left atrial size was normal in size. No left atrial/left atrial appendage thrombus was detected. Right Atrium: Right atrial size was normal in size. Pericardium: There is no evidence of pericardial effusion. Mitral Valve: The mitral valve is grossly normal. Trivial mitral valve regurgitation. Tricuspid Valve: The  tricuspid valve is grossly normal. Tricuspid valve regurgitation is trivial. Aortic Valve: The aortic valve is tricuspid. Aortic valve regurgitation is mild. Pulmonic Valve: The pulmonic valve was grossly normal. Pulmonic valve regurgitation is trivial. Aorta: The aortic root and ascending aorta are structurally normal, with no evidence of dilitation. IAS/Shunts: No atrial level shunt detected by color flow Doppler. Agitated saline contrast was given intravenously to evaluate for intracardiac shunting. Lyman Bishop MD Electronically signed by Lyman Bishop MD Signature Date/Time: 09/18/2020/2:16:58 PM    Final        Medical Problem List and Plan: 1.  Right side weakness/Hemiplegia with hemianopsia and dysarthria secondary to left CR infarction with evolving subacute posterior circulation infarcts. Refused loop recorder and ZIO patch added.  -patient may  shower  -ELOS/Goals: 14-21 days - min A to supervision -getting loop recorder today 2.  Antithrombotics: -DVT/anticoagulation: Lovenox  -antiplatelet therapy: Aspirin 81 mg daily and Brilinta 90 mg twice daily for 30 days then back to aspirin and Plavix DAPT 3. Pain Management: Neurontin 300 mg nightly  4. Mood: Trazodone 100 mg nightly  -antipsychotic agents: N/A 5. Neuropsych: This patient is capable of making decisions on his own behalf. 6. Skin/Wound Care: Routine skin checks 7. Fluids/Electrolytes/Nutrition: Routine in and outs with follow-up chemistries 8.  Diabetes mellitus  and peripheral neuropathy.  Hemoglobin A1c 7.4.  SSI.  Patient on Glucotrol 5 mg daily, Glucophage 500 mg twice daily prior to admission.  Resume as needed 8.  Permissive hypertension.  Currently on Hygroton 25 mg daily.  Patient also had been on hydralazine 50 mg twice daily, Imdur 60 mg daily Cozaar 100 mg daily prior to admission.  Resume as needed 9.  CKD stage III.  Follow-up chemistries 10.  Hyperlipidemia.  Lipitor 11.  COPD/tobacco abuse.  Counseling.   Continue inhalers as directed. 12.  Diastolic congestive heart failure.  Monitor for any signs of fluid overload. 13.  Obesity.  BMI 35.25.  Dietary follow-up 14.  Incidental thyroid nodule measuring 1.5 cm.  Follow-up outpatient thyroid ultrasound.    Lavon Paganini Anguilli- PA-C 09/19/20  I have personally performed a face to face diagnostic evaluation of this patient and formulated the key components of the plan.  Additionally, I have personally reviewed laboratory data, imaging studies, as well as relevant notes and concur with the physician assistant's documentation above.   The patient's status has not changed from the original H&P.  Any changes in documentation from the acute care chart have been noted above.    Courtney Heys, MD 09/19/2020

## 2020-09-19 NOTE — Discharge Instructions (Signed)
Follow with Primary MD Rosita Fire, MD in 7 days   Get CBC, CMP, 2 view Chest X ray -  checked next visit within 1 week by Primary MD or CIR MD    Activity: As tolerated with Full fall precautions use walker/cane & assistance as needed  Disposition CIR  Diet: Heart Healthy    Special Instructions: If you have smoked or chewed Tobacco  in the last 2 yrs please stop smoking, stop any regular Alcohol  and or any Recreational drug use.  On your next visit with your primary care physician please Get Medicines reviewed and adjusted.  Please request your Prim.MD to go over all Hospital Tests and Procedure/Radiological results at the follow up, please get all Hospital records sent to your Prim MD by signing hospital release before you go home.  If you experience worsening of your admission symptoms, develop shortness of breath, life threatening emergency, suicidal or homicidal thoughts you must seek medical attention immediately by calling 911 or calling your MD immediately  if symptoms less severe.  You Must read complete instructions/literature along with all the possible adverse reactions/side effects for all the Medicines you take and that have been prescribed to you. Take any new Medicines after you have completely understood and accpet all the possible adverse reactions/side effects.

## 2020-09-19 NOTE — Progress Notes (Signed)
PMR Admission Coordinator Pre-Admission Assessment  Patient: Craig Knight is an 55 y.o., male MRN: [id number] DOB: 10/30/1965 Height: 5' 7" (170.2 cm) Weight: 102.1 kg  Insurance Information HMO: yes    PPO:      PCP:      IPA:      80/20:      OTHER:  PRIMARY: [employer] Medicare      Policy#: [id number]      Subscriber: patient CM Name:       Phone#: [phone]     Fax#: [phone] Pre-Cert#: [id number]  Shelby from [employer] gave approval on 09/18/20. Pt approved for 7 days beginning 7/26-8/1. Pt's [employer] number: [id number]    Employer:  Benefits:  Phone #: [email or web address]     Name:  Eff. Date: 02/26/20-present    Deduct: $0 (does not have deductible)   Out of Pocket Max: $6,700 ($211.92 met)    Life Max: NA CIR: $430/day co-pay for days 1-4, $0/day co-pay for days 5+      SNF: $0/day co-pay for days 1-20, $188/day co-pay for days 21-56, $0/day co-pay for days 57-100; limited to 100 days/cal year Outpatient: $30/visit co-pay     Co-Pay:  Home Health: 100% coverage      Co-Pay:  DME: 80% coverage     Co-Pay: 20% Providers: in-network SECONDARY:       Policy#:      Phone#:   Financial Counselor:       Phone#:   The "Data Collection Information Summary" for patients in Inpatient Rehabilitation Facilities with attached "Privacy Act Statement-Health Care Records" was provided and verbally reviewed with: Patient  Emergency Contact Information Contact Information     Name Relation Home Work Mobile   Craig Knight Spouse   [phone]   Craig Knight Mother [phone]     Craig Knight Daughter [phone]  [phone]   Craig Knight Daughter [phone]         Current Medical History  Patient Admitting Diagnosis: acute CVA History of Present Illness:Per Craig Knight is a 54-year-old right-handed male with history of hypertension, obesity with BMI 35.25, COPD/tobacco abuse, diabetes mellitus, diastolic congestive heart failure CKD stage III with latest  creatinine 2.40 as well as recent hospitalization 09/06/2020- 09/08/2020 for 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  Maintained on aspirin 81 mg daily and Brilinta 90 mg twice daily plan for 30 days then aspirin 81 mg daily and Plavix 75 mg daily.  He was discharged home ambulating minimal guard 150 feet without assistive device.  Per chart review patient lives with spouse and daughter.  1 level home with 2 steps to entry.  Presented 09/15/2020 to [facility] with nonspecific shortness of breath and worsening right side weakness.  MRI of the brain showed acute left basal ganglia infarction.  Evolving subacute posterior circulation infarct without extension from 09/07/2020.  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  MRA of the head showed diffuse intracranial atherosclerotic disease.  Occlusion right PCA.  Severe stenosis left PCA with distal flow.  This appeared to possibly have progressed since prior studies however difficult to compare given the amount of motion on the MRA.  CTA head neck no emergent large vessel occlusion.  Echocardiogram with ejection fraction of 60 to 65% no wall motion abnormalities.  TEE completed showing no PFO.  Admission chemistries unremarkable except CO2 of 20 BUN 33 creatinine 2.01, WBC 12,800, troponin 33-50, hemoglobin A1c 7.4.  Currently maintained on aspirin 81 mg daily as   well as Brilinta 90 mg twice daily for CVA prophylaxis for 30 days then back to aspirin and Plavix DAPT.  Subcutaneous Lovenox for DVT prophylaxis.  Awaiting plan for loop recorder placement.  Tolerating a regular consistency diet.  Therapy evaluations completed due to patient's increasing right side weakness was admitted for a comprehensive rehab program.   Complete NIHSS TOTAL: 9  Patient's medical record from [facility] has been reviewed by the rehabilitation admission coordinator and  physician.  Past Medical History  Past Medical History:  Diagnosis Date   Asthma    Bronchitis    COPD (chronic obstructive pulmonary disease) (HCC)    Diabetes mellitus without complication (HCC)    Heart murmur 08/24/2020   soft systolic murmur 1/6   Hypertension    Sleep apnea    Stroke (HCC)     Family History   family history includes Heart attack in his maternal grandfather and paternal grandmother; Heart attack (age of onset: 60) in his father; Hypertension in his brother and sister; Stomach cancer in his brother; Stroke in his brother, maternal grandmother, and mother.  Prior Rehab/Hospitalizations Has the patient had prior rehab or hospitalizations prior to admission? Yes  Has the patient had major surgery during 100 days prior to admission? Yes   Current Medications  Current Facility-Administered Medications:    acetaminophen (TYLENOL) tablet 650 mg, 650 mg, Oral, Q4H PRN, 650 mg at 09/18/20 1016 **OR** acetaminophen (TYLENOL) 160 MG/5ML solution 650 mg, 650 mg, Per Tube, Q4H PRN **OR** acetaminophen (TYLENOL) suppository 650 mg, 650 mg, Rectal, Q4H PRN, Elgergawy, Dawood S, MD   aspirin EC tablet 81 mg, 81 mg, Oral, Daily, Elgergawy, Dawood S, MD, 81 mg at 09/19/20 1000   atorvastatin (LIPITOR) tablet 80 mg, 80 mg, Oral, Daily, Elgergawy, Dawood S, MD, 80 mg at 09/19/20 1000   enoxaparin (LOVENOX) injection 40 mg, 40 mg, Subcutaneous, Q24H, Elgergawy, Dawood S, MD, 40 mg at 09/19/20 0959   gabapentin (NEURONTIN) capsule 300 mg, 300 mg, Oral, QHS, Singh, Prashant K, MD, 300 mg at 09/18/20 2148   insulin aspart (novoLOG) injection 0-5 Units, 0-5 Units, Subcutaneous, QHS, Elgergawy, Dawood S, MD   insulin aspart (novoLOG) injection 0-9 Units, 0-9 Units, Subcutaneous, TID WC, Elgergawy, Dawood S, MD, 1 Units at 09/16/20 1711   ipratropium-albuterol (DUONEB) 0.5-2.5 (3) MG/3ML nebulizer solution 3 mL, 3 mL, Nebulization, Q4H PRN, Elgergawy, Dawood S, MD, 3 mL at 09/17/20  1919   mupirocin ointment (BACTROBAN) 2 % 1 application, 1 application, Nasal, BID, Singh, Prashant K, MD, 1 application at 09/19/20 1000   ticagrelor (BRILINTA) tablet 90 mg, 90 mg, Oral, BID, Singh, Prashant K, MD, 90 mg at 09/19/20 1000   traZODone (DESYREL) tablet 100 mg, 100 mg, Oral, QHS, Singh, Prashant K, MD, 100 mg at 09/18/20 2148   umeclidinium-vilanterol (ANORO ELLIPTA) 62.5-25 MCG/INH 1 puff, 1 puff, Inhalation, Daily, Elgergawy, Dawood S, MD, 1 puff at 09/19/20 0847  Patients Current Diet:  Diet Order             Diet Heart Room service appropriate? Yes; Fluid consistency: Thin  Diet effective now                   Precautions / Restrictions Precautions Precautions: Fall Precaution Comments: L hemianopia; Lt hemiparesis Restrictions Weight Bearing Restrictions: No   Has the patient had 2 or more falls or a fall with injury in the past year? No  Prior Activity Level Community (5-7x/wk): drives, leaves house 6 days/week    Prior Functional Level Self Care: Did the patient need help bathing, dressing, using the toilet or eating? Independent  Indoor Mobility: Did the patient need assistance with walking from room to room (with or without device)? Independent  Stairs: Did the patient need assistance with internal or external stairs (with or without device)? Independent  Functional Cognition: Did the patient need help planning regular tasks such as shopping or remembering to take medications? Independent  Home Assistive Devices / Equipment Home Assistive Devices/Equipment: None Home Equipment: None  Prior Device Use: Indicate devices/aids used by the patient prior to current illness, exacerbation or injury? None of the above  Current Functional Level Cognition  Arousal/Alertness: Awake/alert Overall Cognitive Status: Impaired/Different from baseline Current Attention Level: Sustained Orientation Level: Oriented X4 Following Commands: Follows one step commands  inconsistently General Comments: A&Ox4, appears to appropriately be aware of L visual field deficits and difficulty with R sided strength and mobility. Could benefit from further assessment. Attention: Focused Focused Attention: Appears intact Memory: Impaired Memory Impairment: Decreased short term memory Decreased Short Term Memory: Verbal complex Awareness: Appears intact Problem Solving: Appears intact Safety/Judgment: Appears intact    Extremity Assessment (includes Sensation/Coordination)  Upper Extremity Assessment: LUE deficits/detail RUE Deficits / Details: PROM WFL.  Pt with trace activation Lt shoulder and elbow.  He demonstrates isolated thumb movements and active finger extension LUE Coordination: decreased gross motor, decreased fine motor  Lower Extremity Assessment: Defer to PT evaluation RLE Deficits / Details: MMT scores of the following = hip flexion 2+, knee extension 3-, ankle dorsiflexion 2+ RLE Sensation:  (denies numbness/tingling) RLE Coordination: decreased fine motor, decreased gross motor    ADLs  Overall ADL's : Needs assistance/impaired Eating/Feeding: Set up, Sitting Grooming: Wash/dry hands, Wash/dry face, Oral care, Minimal assistance, Sitting Upper Body Bathing: Moderate assistance, Sitting Lower Body Bathing: Maximal assistance, Sit to/from stand Upper Body Dressing : Moderate assistance, Sitting Lower Body Dressing: Maximal assistance, Sit to/from stand Toilet Transfer: Minimal assistance, Stand-pivot, BSC Toileting- Clothing Manipulation and Hygiene: Maximal assistance, Sit to/from stand Functional mobility during ADLs: Minimal assistance    Mobility  Overal bed mobility: Needs Assistance Bed Mobility: Supine to Sit, Sit to Supine Supine to sit: Mod assist Sit to supine: Min assist General bed mobility comments: Pt insists he can't move Rt side.  To get out of bed, he required assist for Rt LE and assist to lift trunk.  Upon return to supine,  he needed assist only for Rt LE - he was able to lift it 1/2 way onto bed    Transfers  Overall transfer level: Needs assistance Equipment used: 1 person hand held assist Transfers: Sit to/from Stand, Stand Pivot Transfers Sit to Stand: Min assist Stand pivot transfers: Mod assist General transfer comment: assist to steady, and assist to pivot Rt foot as well as assist for safetly    Ambulation / Gait / Stairs / Wheelchair Mobility  Ambulation/Gait Ambulation/Gait assistance: Mod assist Gait Distance (Feet): 4 Feet Assistive device: Rolling walker (2 wheeled) Gait Pattern/deviations: Step-to pattern, Decreased step length - right, Decreased stride length, Decreased dorsiflexion - right, Narrow base of support General Gait Details: Pt displaying no R knee buckling, but displaying difficulty lifting foot off ground to complete R foot advancement during swing, needing physical assistance to do so. ModA to steady and assist with R leg stepping anterior and posterior a couple times. Limited gait distance due to transport arriving to take pt to test. Gait velocity: decreased Gait velocity interpretation: <1.31 ft/sec, indicative   of household ambulator    Posture / Balance Dynamic Sitting Balance Sitting balance - Comments: Able to reach min off BOS, but pt displays LOB (to R often) when reaching further to try to reach floor or when shifting weight significantly, needing min guard-minA sitting EOB dynamically. Balance Overall balance assessment: Needs assistance Sitting-balance support: No upper extremity supported, Feet supported Sitting balance-Leahy Scale: Fair Sitting balance - Comments: Able to reach min off BOS, but pt displays LOB (to R often) when reaching further to try to reach floor or when shifting weight significantly, needing min guard-minA sitting EOB dynamically. Postural control: Right lateral lean Standing balance support: Single extremity supported Standing balance-Leahy  Scale: Poor Standing balance comment: reliant on support    Special needs/care consideration Continuous Drip IV  0.9% sodium chloride infusion, Diabetic management novoLOG: 0-5 units daily at bedtime; novoLOG: 0-9 units 3 times a day, and Designated visitor Lisa Sofranko, wife   Previous Home Environment (from acute therapy documentation) Living Arrangements: Spouse/significant other, Children  Lives With: Spouse, Daughter Available Help at Discharge: Family, Available 24 hours/day Type of Home: House Home Layout: One level Home Access: Stairs to enter Entrance Stairs-Rails: None Entrance Stairs-Number of Steps: 2 Bathroom Shower/Tub: Walk-in shower Bathroom Toilet: Standard Bathroom Accessibility: Yes How Accessible: Accessible via walker Home Care Services: No  Discharge Living Setting Plans for Discharge Living Setting: Patient's home Type of Home at Discharge: House Discharge Home Layout: One level Discharge Home Access: Stairs to enter Entrance Stairs-Rails: None Entrance Stairs-Number of Steps: 2 Discharge Bathroom Shower/Tub: Walk-in shower Discharge Bathroom Toilet: Standard Discharge Bathroom Accessibility: Yes How Accessible: Accessible via walker Does the patient have any problems obtaining your medications?: No  Social/Family/Support Systems Patient Roles: Spouse, Parent Anticipated Caregiver: Lisa Holzer, wife Caregiver Availability: 24/7  Goals Patient/Family Goal for Rehab: Supervision-Mod I:PT/OT/ST Expected length of stay: 14-21 days Pt/Family Agrees to Admission and willing to participate: Yes Program Orientation Provided & Reviewed with Pt/Caregiver Including Roles  & Responsibilities: Yes  Decrease burden of Care through IP rehab admission: NA  Possible need for SNF placement upon discharge: Not anticipated  Patient Condition: I have reviewed medical [facility] from [facility], spoken with CM, and patient and spouse. I met with  patient at the bedside and discussed via phone for inpatient rehabilitation assessment.  Patient will benefit from ongoing PT, OT, and SLP, can actively participate in 3 hours of therapy a day 5 days of the week, and can make measurable gains during the admission.  Patient will also benefit from the coordinated team approach during an Inpatient Acute Rehabilitation admission.  The patient will receive intensive therapy as well as Rehabilitation physician, nursing, social worker, and care management interventions.  Due to safety, skin/wound care, disease management, medication administration, pain management, and patient education the patient requires 24 hour a day rehabilitation nursing.  The patient is currently min-max A with mobility and basic ADLs.  Discharge setting and therapy post discharge at home with home health is anticipated.  Patient has agreed to participate in the Acute Inpatient Rehabilitation Program and will admit today.  Preadmission Screen Completed By:  Codi Folkerts B Arieon Corcoran, 09/19/2020 10:13 AM ______________________________________________________________________   Discussed status with Dr. Lovorn on 09/19/20 at 930 and received approval for admission today.  Admission Coordinator:  Maryana Pittmon B Shanty Ginty, CCC-SLP, time 10:59 am/Date 09/19/20   Assessment/Plan: Diagnosis: Does the need for close, 24 hr/day Medical supervision in concert with the patient's rehab needs make it unreasonable for this patient to   be served in a less intensive setting? Yes Co-Morbidities requiring supervision/potential complications: R PCA stroke, L BG stroke with R and L hemiparesis; COPD, HTN, BMI of 35; DM A1c of 7.4; dCHF; and Cr of 2.4 Due to bladder management, bowel management, safety, skin/wound care, disease management, medication administration, and patient education, does the patient require 24 hr/day rehab nursing? Yes Does the patient require coordinated care of a physician, rehab nurse, PT, OT, and SLP to  address physical and functional deficits in the context of the above medical diagnosis(es)? Yes Addressing deficits in the following areas: balance, endurance, locomotion, strength, transferring, bowel/bladder control, bathing, dressing, feeding, grooming, toileting, cognition, speech, language, and swallowing Can the patient actively participate in an intensive therapy program of at least 3 hrs of therapy 5 days a week? Yes The potential for patient to make measurable gains while on inpatient rehab is good Anticipated functional outcomes upon discharge from inpatient rehab: modified independent and supervision PT, modified independent and supervision OT, modified independent and supervision SLP Estimated rehab length of stay to reach the above functional goals is: 14-21 days Anticipated discharge destination: Home 10. Overall Rehab/Functional Prognosis: good   MD Signature:  

## 2020-09-19 NOTE — Progress Notes (Signed)
Inpatient Rehabilitation Medication Review by a Pharmacist  A complete drug regimen review was completed for this patient to identify any potential clinically significant medication issues.  Clinically significant medication issues were identified:  no  Check AMION for pharmacist assigned to patient if future medication questions/issues arise during this admission.  Pharmacist comments:   Time spent performing this drug regimen review (minutes):  15   Zeanna Sunde 09/19/2020 5:24 PM

## 2020-09-19 NOTE — Patient Outreach (Signed)
[name]) Care Management  09/19/2020  Craig Knight 08-18-1965 [id number]   Noted that member was readmitted to hospital for recurrent stroke.  Will be admitted to CIR, will close case at this time.  [name], [place], MSN [place] [phone]'

## 2020-09-19 NOTE — Plan of Care (Signed)
  Problem: Education: Goal: Knowledge of disease or condition will improve Outcome: Progressing Goal: Knowledge of secondary prevention will improve Outcome: Progressing Goal: Knowledge of patient specific risk factors addressed and post discharge goals established will improve Outcome: Progressing   Problem: Education: Goal: Knowledge of General Education information will improve Description: Including pain rating scale, medication(s)/side effects and non-pharmacologic comfort measures Outcome: Progressing   Problem: Health Behavior/Discharge Planning: Goal: Ability to manage health-related needs will improve Outcome: Progressing   Problem: Clinical Measurements: Goal: Ability to maintain clinical measurements within normal limits will improve Outcome: Progressing Goal: Will remain free from infection Outcome: Progressing Goal: Diagnostic test results will improve Outcome: Progressing Goal: Respiratory complications will improve Outcome: Progressing Goal: Cardiovascular complication will be avoided Outcome: Progressing   Problem: Activity: Goal: Risk for activity intolerance will decrease Outcome: Progressing   Problem: Nutrition: Goal: Adequate nutrition will be maintained Outcome: Progressing   Problem: Coping: Goal: Level of anxiety will decrease Outcome: Progressing   Problem: Elimination: Goal: Will not experience complications related to bowel motility Outcome: Progressing Goal: Will not experience complications related to urinary retention Outcome: Progressing   Problem: Pain Managment: Goal: General experience of comfort will improve Outcome: Progressing   Problem: Safety: Goal: Ability to remain free from injury will improve Outcome: Progressing   Problem: Skin Integrity: Goal: Risk for impaired skin integrity will decrease Outcome: Progressing

## 2020-09-19 NOTE — Progress Notes (Signed)
Patient handed off to [facility]. Report given.

## 2020-09-19 NOTE — Care Management Important Message (Signed)
Important Message  Patient Details  Name: Craig Knight MRN: [id number] Date of Birth: 1966/02/16   Medicare Important Message Given:  Yes     [name] 09/19/2020, 9:22 AM

## 2020-09-19 NOTE — Progress Notes (Signed)
  Speech Language Pathology Treatment: Cognitive-Linquistic  Patient Details Name: Craig Knight MRN: [id number] DOB: [birth year] Today's Date: 09/19/2020 Time: [id number] SLP Time Calculation (min) (ACUTE ONLY): 20 min  Assessment / Plan / Recommendation Clinical Impression  SLP followed up for dysarthria and cognitive linguistic intervention. Pt pleasant upright in chair at bedside with spouse present. SLP noted dysarthria persists, pt states clarity of speech has decreased since CVA. Spouse reports pt has however had hx of stuttering premorbidly; no stuttering exhibiting during clinical interactions. Discussed strategies for improved speech clarity including slowing rate, pausing, increasing vocal intensity, and over articulation. Pt verbalized understanding though will likely require reinforcement with post CVA memory difficulties. SLP facilitated functional cognitive tasks. Pt reports frustration with inability to navigate Iphone (per spouse was able to use without difficulty prior to CVA). Current limitations include reduced pt visual scanning, decreased executive function skills, and dysarthria limiting ability for voice recognition for text generation. With cueing from SLP pt able to improve tasks accuracy for functional Iphone use (read text, open voicemail) from ~40 percent to 80 percent with multimodal cues. Pt with plans for DC to IPR this afternoon. Continued ST intervention indicated at next level of care.   HPI HPI: Pt is a 55 year old male with medical hx significant for: HTN, DM, COPD, sleep apnea, asthma, bronchitis, CVA. Pt was recently hospitalized 09/06/20-09/08/20 d/t large right PCA territory infarct and left pontine stroke with residual mild right side weakness, left hemianopsia, and right facial droop. Pt presented to ED at [facility] on 7/22 with c/o shortness of breath and worsening right side weakness. MRI revealed new acute left basal ganglia infarct and evolving right  PCA stroke.  MRA head and neck revealed occlusion of right PCA  and severe stenosis of left PCA. Pt transferred to [facility] for further neurology workup.      SLP Plan  Continue with current plan of care       Recommendations   Skilled ST @ IPR                 Follow up Recommendations: Inpatient Rehab SLP Visit Diagnosis: Cognitive communication deficit (R41.841);Dysarthria and anarthria (R47.1) Plan: Continue with current plan of care       [place], [place]   09/19/2020, 11:32 AM

## 2020-09-19 NOTE — TOC Transition Note (Signed)
Transition of Care [facility]) - CM/SW Discharge Note   Patient Details  Name: Craig Knight MRN: [id number] Date of Birth: 1965-07-16  Transition of Care [facility]) CM/SW Contact:  Pollie Friar, RN Phone Number: 09/19/2020, 10:09 AM   Clinical Narrative:    Patient is discharging to CIR today. CM is signing off.   Final next level of care: IP Rehab Facility Barriers to Discharge: No Barriers Identified   Patient Goals and CMS Choice        Discharge Placement                       Discharge Plan and Services                                     Social Determinants of Health (SDOH) Interventions     Readmission Risk Interventions No flowsheet data found.

## 2020-09-19 NOTE — Progress Notes (Signed)
Patient admitted to 4 MW this afternoon. No complaints of pain at this time, personal belongings  and call light within reach.  Angie Fava

## 2020-09-19 NOTE — Progress Notes (Signed)
Physical Therapy Treatment Patient Details Name: Craig Knight MRN: [id number] DOB: 04-12-1965 Today's Date: 09/19/2020    History of Present Illness Pt is a 55 y.o. male who presented 09/15/20 with SOB and worsening R sided weakness. Pt admitted 09/06/20-09/08/20 with R-sided weakness and speech changes in which they found pt had large R PCA stroke, left pontine stroke, and remote L frontal and lacunar infarcts in bil basal ganglia, corona radiata and thalami. MRI 09/15/20 revealed new acute L basal ganglia infarct and evolving R PCA stroke, and MRA revealed occlusion at R PCA and severe stenosis L PCA with distal flow. Outside window for tPA. PMH COPD, DM, HTN, CVA    PT Comments    Pt with significant deficits in strength and balance that are impacting mobility. Worked primarily on standing and trying to stimulate RLE and trunk to improve stability. Too weak to pivot to chair so used Stedy. Pt to go to CIR today.    Follow Up Recommendations  CIR;Supervision for mobility/OOB     Equipment Recommendations  Wheelchair (measurements PT);Wheelchair cushion (measurements PT)    Recommendations for Other Services       Precautions / Restrictions Precautions Precautions: Fall Precaution Comments: L hemianopia; Lt hemiparesis Restrictions Weight Bearing Restrictions: No    Mobility  Bed Mobility Overal bed mobility: Needs Assistance Bed Mobility: Supine to Sit     Supine to sit: Mod assist     General bed mobility comments: Assist to bring RLE off of bed and elevate trunk into sitting    Transfers Overall transfer level: Needs assistance Equipment used: 1 person hand held assist;Rolling walker (2 wheeled);Ambulation equipment used Transfers: Sit to/from Stand Sit to Stand: Mod assist;+2 physical assistance         General transfer comment: Assist to bring hips up and for balance. Support given to rt knee and hip to extend. Stood 1 time with +1 mod assist and all others  needed +2 mod assist. Used LUE for support on walker or on Stedy. Unable to gain enough stability in standing to attempt pivotal steps so used Stedy for bed to chair.  Ambulation/Gait             General Gait Details: Unable   Stairs             Wheelchair Mobility    Modified Rankin (Stroke Patients Only) Modified Rankin (Stroke Patients Only) Pre-Morbid Rankin Score: Moderate disability Modified Rankin: Severe disability     Balance Overall balance assessment: Needs assistance Sitting-balance support: Feet supported;Single extremity supported Sitting balance-Leahy Scale: Poor Sitting balance - Comments: UE support   Standing balance support: Single extremity supported Standing balance-Leahy Scale: Poor Standing balance comment: Mod assist for static standing                            Cognition Arousal/Alertness: Awake/alert Behavior During Therapy: Flat affect Overall Cognitive Status: Impaired/Different from baseline Area of Impairment: Attention;Following commands;Problem solving;Awareness                   Current Attention Level: Sustained   Following Commands: Follows one step commands with increased time   Awareness: Emergent Problem Solving: Slow processing;Decreased initiation;Difficulty sequencing;Requires verbal cues;Requires tactile cues        Exercises      General Comments        Pertinent Vitals/Pain Pain Assessment: No/denies pain    Home Living  Prior Function            PT Goals (current goals can now be found in the care plan section) Acute Rehab PT Goals Patient Stated Goal: to be able to take care of self Progress towards PT goals: Not progressing toward goals - comment    Frequency    Min 4X/week      PT Plan Current plan remains appropriate    Co-evaluation              AM-PAC PT "6 Clicks" Mobility   Outcome Measure  Help needed turning from your  back to your side while in a flat bed without using bedrails?: A Little Help needed moving from lying on your back to sitting on the side of a flat bed without using bedrails?: A Lot Help needed moving to and from a bed to a chair (including a wheelchair)?: Total Help needed standing up from a chair using your arms (e.g., wheelchair or bedside chair)?: Total Help needed to walk in hospital room?: Total Help needed climbing 3-5 steps with a railing? : Total 6 Click Score: 9    End of Session Equipment Utilized During Treatment: Gait belt Activity Tolerance: Patient tolerated treatment well Patient left: in chair;with call bell/phone within reach;with chair alarm set Nurse Communication: Mobility status;Need for lift equipment PT Visit Diagnosis: Hemiplegia and hemiparesis;Other abnormalities of gait and mobility (R26.89) Hemiplegia - Right/Left: Right Hemiplegia - dominant/non-dominant: Dominant Hemiplegia - caused by: Cerebral infarction     Time: [id number] PT Time Calculation (min) (ACUTE ONLY): 22 min  Charges:  $Therapeutic Activity: 8-22 mins                     [place] Pager [phone] Office [place] 09/19/2020, 11:41 AM

## 2020-09-20 DIAGNOSIS — I639 Cerebral infarction, unspecified: Secondary | ICD-10-CM | POA: Diagnosis not present

## 2020-09-20 LAB — GLUCOSE, CAPILLARY
Glucose-Capillary: 93 mg/dL (ref 70–99)
Glucose-Capillary: 120 mg/dL — ABNORMAL HIGH (ref 70–99)
Glucose-Capillary: 101 mg/dL — ABNORMAL HIGH (ref 70–99)
Glucose-Capillary: 113 mg/dL — ABNORMAL HIGH (ref 70–99)

## 2020-09-20 LAB — CBC WITH DIFFERENTIAL/PLATELET
Abs Immature Granulocytes: 0.03 10*3/uL (ref 0.00–0.07)
Basophils Absolute: 0 10*3/uL (ref 0.0–0.1)
Basophils Relative: 0 %
Eosinophils Absolute: 0.4 10*3/uL (ref 0.0–0.5)
Eosinophils Relative: 4 %
HCT: 43.2 % (ref 39.0–52.0)
Hemoglobin: 14.8 g/dL (ref 13.0–17.0)
Immature Granulocytes: 0 %
Lymphocytes Relative: 22 %
Lymphs Abs: 2.2 10*3/uL (ref 0.7–4.0)
MCH: 26.9 pg (ref 26.0–34.0)
MCHC: 34.3 g/dL (ref 30.0–36.0)
MCV: 78.4 fL — ABNORMAL LOW (ref 80.0–100.0)
Monocytes Absolute: 0.8 10*3/uL (ref 0.1–1.0)
Monocytes Relative: 8 %
Neutro Abs: 6.9 10*3/uL (ref 1.7–7.7)
Neutrophils Relative %: 66 %
Platelets: 211 10*3/uL (ref 150–400)
RBC: 5.51 MIL/uL (ref 4.22–5.81)
RDW: 13.4 % (ref 11.5–15.5)
WBC: 10.3 10*3/uL (ref 4.0–10.5)
nRBC: 0 % (ref 0.0–0.2)

## 2020-09-20 LAB — COMPREHENSIVE METABOLIC PANEL
ALT: 25 U/L (ref 0–44)
AST: 22 U/L (ref 15–41)
Albumin: 3.6 g/dL (ref 3.5–5.0)
Alkaline Phosphatase: 66 U/L (ref 38–126)
Anion gap: 8 (ref 5–15)
BUN: 23 mg/dL — ABNORMAL HIGH (ref 6–20)
CO2: 24 mmol/L (ref 22–32)
Calcium: 9.2 mg/dL (ref 8.9–10.3)
Chloride: 104 mmol/L (ref 98–111)
Creatinine, Ser: 1.57 mg/dL — ABNORMAL HIGH (ref 0.61–1.24)
GFR, Estimated: 51 mL/min — ABNORMAL LOW (ref >60)
Glucose, Bld: 98 mg/dL (ref 70–99)
Potassium: 3.9 mmol/L (ref 3.5–5.1)
Sodium: 136 mmol/L (ref 135–145)
Total Bilirubin: 0.4 mg/dL (ref 0.3–1.2)
Total Protein: 6.2 g/dL — ABNORMAL LOW (ref 6.5–8.1)

## 2020-09-20 NOTE — Progress Notes (Signed)
Inpatient Rehabilitation Care Coordinator Assessment and Plan Patient Details  Name: Craig Knight MRN: [id number] Date of Birth: Jan 06, 1966  Today's Date: 09/20/2020  Hospital Problems: Principal Problem:   Infarction of left basal ganglia (HCC) Active Problems:   DM type 2 (diabetes mellitus, type 2) ([place])   CKD (chronic kidney disease), stage III [facility])  Past Medical History:  Past Medical History:  Diagnosis Date   Asthma    Bronchitis    COPD (chronic obstructive pulmonary disease) ([place])    Diabetes mellitus without complication ([place])    Heart murmur 82/70/7867   soft systolic murmur 1/6   Hypertension    Sleep apnea    Stroke [facility])    Past Surgical History:  Past Surgical History:  Procedure Laterality Date   COLONOSCOPY N/A 09/18/2016   Procedure: COLONOSCOPY;  Surgeon: Danie Binder, MD;  Location: [facility];  Service: Endoscopy;  Laterality: N/A;  10:30 AM   lipoma removal     Social History:  reports that he has been smoking cigarettes. He has been smoking an average of .25 packs per day. He has never used smokeless tobacco. He reports current alcohol use. He reports that he does not use drugs.  Family / Support Systems Marital Status: Married Patient Roles: Spouse Spouse/Significant Other: [name] Children: Laymond Purser Other Supports: Ecologist (Mother) Anticipated Caregiver: spouse Ability/Limitations of Caregiver: n/a Caregiver Availability: 24/7  Social History Preferred language: English Religion: Non-Denominational Read: Yes Write: Yes Employment Status: Employed Public relations account executive Issues: n/a Guardian/Conservator: spouse and mother   Abuse/Neglect Abuse/Neglect Assessment Can Be Completed: Yes Physical Abuse: Denies Verbal Abuse: Denies Sexual Abuse: Denies Exploitation of patient/patient's resources: Denies Self-Neglect: Denies  Emotional Status Recent Psychosocial Issues: coping Psychiatric History: n/a Substance Abuse  History: Tobacco  Patient / Family Perceptions, Expectations & Goals Pt/Family understanding of illness & functional limitations: yes Premorbid pt/family roles/activities: Working and driving Anticipated changes in roles/activities/participation: spouse able to assit with roles and task Pt/family expectations/goals: family able to transport  [employer]: None Premorbid Home Care/DME Agencies: None Resource referrals recommended: Neuropsychology (substance/tobacco)  Discharge Planning Living Arrangements: Spouse/significant other, Children Support Systems: Spouse/significant other, Children Type of Residence: Private residence (1 level home, 2 steps) Insurance Resources: Multimedia programmer (specify) ([employer] Medicare) Museum/gallery curator Resources: Employment Museum/gallery curator Screen Referred: No Living Expenses: Education officer, community Management: Patient Does the patient have any problems obtaining your medications?: No Home Management: Independent Patient/Family Preliminary Plans: Spouse able to assist with money and medications if needed Care Coordinator Barriers to Discharge: IV antibiotics Care Coordinator Anticipated Follow Up Needs: HH/OP Expected length of stay: 14-21 Days  Clinical Impression SW met with patient, introduced self, explained role and addressed questions concerns. Patient plans to discharge home with his spouse. No additional questions or concerns, sw will continue to follow up   Dyanne Iha 09/20/2020, 1:29 PM

## 2020-09-20 NOTE — Discharge Instructions (Addendum)
Inpatient Rehab Discharge Instructions  Craig Knight Discharge date and time: No discharge date for patient encounter.   Activities/Precautions/ Functional Status: Activity: activity as tolerated Diet: diabetic diet Wound Care: Routine skin checks Functional status:  ___ No restrictions     ___ Walk up steps independently ___ 24/7 supervision/assistance   ___ Walk up steps with assistance ___ Intermittent supervision/assistance  ___ Bathe/dress independently ___ Walk with walker     _x__ Bathe/dress with assistance ___ Walk Independently    ___ Shower independently ___ Walk with assistance    ___ Shower with assistance ___ No alcohol     ___ Return to work/school ________  COMMUNITY REFERRALS UPON DISCHARGE:    Home Health:   PT     OT     ST                   Agency: [facility] Phone: [phone]   Outpatient: PT     OT    ST    Other:             Agency:[facility] Phone: [phone]              Appointment Date/Time: TBD  Medical Equipment/Items Ordered: Custom Wheelchair, Drop arm Commode                                                 Agency/Supplier: [employer]    Special Instructions: No driving smoking or alcohol  Plan aspirin 81 mg daily and Brilinta 90 mg twice daily x30 days then resume aspirin 81 mg daily and Plavix 75 mg daily   My questions have been answered and I understand these instructions. I will adhere to these goals and the provided educational materials after my discharge from the hospital.  Patient/Caregiver Signature _______________________________ Date __________  Clinician Signature _______________________________________ Date __________  Please bring this form and your medication list with you to all your follow-up doctor's appointments.  STROKE/TIA DISCHARGE INSTRUCTIONS SMOKING Cigarette smoking nearly doubles your risk of having a stroke & is the single most alterable risk factor  If you smoke or have smoked in the last 12 months, you  are advised to quit smoking for your health. Most of the excess cardiovascular risk related to smoking disappears within a year of stopping. Ask you doctor about anti-smoking medications [place] Quit Line: 1-800-QUIT NOW Free Smoking Cessation Classes [phone]  CHOLESTEROL Know your levels; limit fat & cholesterol in your diet  Lipid Panel     Component Value Date/Time   CHOL 115 09/16/2020 0406   TRIG 77 09/16/2020 0406   HDL 33 (L) 09/16/2020 0406   CHOLHDL 3.5 09/16/2020 0406   VLDL 15 09/16/2020 0406   LDLCALC 67 09/16/2020 0406     Many patients benefit from treatment even if their cholesterol is at goal. Goal: Total Cholesterol (CHOL) less than 160 Goal:  Triglycerides (TRIG) less than 150 Goal:  HDL greater than 40 Goal:  LDL (LDLCALC) less than 100   BLOOD PRESSURE American Stroke Association blood pressure target is less that 120/80 mm/Hg  Your discharge blood pressure is:  BP: (!) 170/98 Monitor your blood pressure Limit your salt and alcohol intake Many individuals will require more than one medication for high blood pressure  DIABETES (A1c is a blood sugar average for last 3 months) Goal  HGBA1c is under 7% (HBGA1c is blood sugar average for last 3 months)  Diabetes:    Lab Results  Component Value Date   HGBA1C 7.4 (H) 09/16/2020    Your HGBA1c can be lowered with medications, healthy diet, and exercise. Check your blood sugar as directed by your physician Call your physician if you experience unexplained or low blood sugars.  PHYSICAL ACTIVITY/REHABILITATION Goal is 30 minutes at least 4 days per week  Activity: Increase activity slowly, Therapies: Physical Therapy: Home Health Return to work:  Activity decreases your risk of heart attack and stroke and makes your heart stronger.  It helps control your weight and blood pressure; helps you relax and can improve your mood. Participate in a regular exercise program. Talk with your doctor about the best form of  exercise for you (dancing, walking, swimming, cycling).  DIET/WEIGHT Goal is to maintain a healthy weight  Your discharge diet is:  Diet Order             Diet Heart Room service appropriate? Yes; Fluid consistency: Thin  Diet effective now                   liquids Your height is:  Height: '5\' 7"'$  (170.2 cm) Your current weight is: Weight: 99.5 kg Your Body Mass Index (BMI) is:  BMI (Calculated): 34.35 Following the type of diet specifically designed for you will help prevent another stroke. Your goal weight range is:   Your goal Body Mass Index (BMI) is 19-24. Healthy food habits can help reduce 3 risk factors for stroke:  High cholesterol, hypertension, and excess weight.  RESOURCES Stroke/Support Group:  Call [phone]   STROKE EDUCATION PROVIDED/REVIEWED AND GIVEN TO PATIENT Stroke warning signs and symptoms How to activate emergency medical system (call 911). Medications prescribed at discharge. Need for follow-up after discharge. Personal risk factors for stroke. Pneumonia vaccine given: No Flu vaccine given: No My questions have been answered, the writing is legible, and I understand these instructions.  I will adhere to these goals & educational materials that have been provided to me after my discharge from the hospital.

## 2020-09-20 NOTE — Plan of Care (Signed)
  Problem: RH Swallowing Goal: LTG Patient will consume least restrictive diet using compensatory strategies with assistance (SLP) Description: LTG:  Patient will consume least restrictive diet using compensatory strategies with assistance (SLP) Flowsheets (Taken 09/20/2020 1706) LTG: Pt Patient will consume least restrictive diet using compensatory strategies with assistance of (SLP): Modified Independent Goal: LTG Pt will demonstrate functional change in swallow as evidenced by bedside/clinical objective assessment (SLP) Description: LTG: Patient will demonstrate functional change in swallow as evidenced by bedside/clinical objective assessment (SLP) Flowsheets (Taken 09/20/2020 1706) LTG: Patient will demonstrate functional change in swallow as evidenced by bedside/clinical objective assessment: Oral swallow   Problem: RH Expression Communication Goal: LTG Patient will increase speech intelligibility (SLP) Description: LTG: Patient will increase speech intelligibility at word/phrase/conversation level with cues, % of the time (SLP) Flowsheets (Taken 09/20/2020 1706) LTG: Patient will increase speech intelligibility (SLP): Supervision Level:  Phrase  Conversation level   Problem: RH Problem Solving Goal: LTG Patient will demonstrate problem solving for (SLP) Description: LTG:  Patient will demonstrate problem solving for basic/complex daily situations with cues  (SLP) Flowsheets (Taken 09/20/2020 1706) LTG: Patient will demonstrate problem solving for (SLP):  Basic daily situations  Complex daily situations LTG Patient will demonstrate problem solving for: Minimal Assistance - Patient > 75%   Problem: RH Memory Goal: LTG Patient will demonstrate ability for day to day (SLP) Description: LTG:   Patient will demonstrate ability for day to day recall/carryover during cognitive/linguistic activities with assist  (SLP) Flowsheets (Taken 09/20/2020 1706) LTG: Patient will demonstrate ability  for day to day recall: New information LTG: Patient will demonstrate ability for day to day recall/carryover during cognitive/linguistic activities with assist (SLP): Minimal Assistance - Patient > 75% Goal: LTG Patient will use memory compensatory aids to (SLP) Description: LTG:  Patient will use memory compensatory aids to recall biographical/new, daily complex information with cues (SLP) Flowsheets (Taken 09/20/2020 1706) LTG: Patient will use memory compensatory aids to (SLP): Supervision

## 2020-09-20 NOTE — Progress Notes (Signed)
[place] Individual Statement of Services  Patient Name:  Craig Knight  Date:  09/20/2020  Welcome to the [facility].  Our goal is to provide you with an individualized program based on your diagnosis and situation, designed to meet your specific needs.  With this comprehensive rehabilitation program, you will be expected to participate in at least 3 hours of rehabilitation therapies Monday-Friday, with modified therapy programming on the weekends.  Your rehabilitation program will include the following services:  Physical Therapy (PT), Occupational Therapy (OT), Speech Therapy (ST), 24 hour per day rehabilitation nursing, Therapeutic Recreaction (TR), Neuropsychology, Care Coordinator, Rehabilitation Medicine, Nutrition Services, Pharmacy Services, and Other  Weekly team conferences will be held on Wednesdays to discuss your progress.  Your Inpatient Rehabilitation Care Coordinator will talk with you frequently to get your input and to update you on team discussions.  Team conferences with you and your family in attendance may also be held.  Expected length of stay:  14-21 Days  Overall anticipated outcome:  Supervision to MOD II  Depending on your progress and recovery, your program may change. Your Inpatient Rehabilitation Care Coordinator will coordinate services and will keep you informed of any changes. Your Inpatient Rehabilitation Care Coordinator's name and contact numbers are listed  below.  The following services may also be recommended but are not provided by the [facility]:   [place] will be made to provide these services after discharge if needed.  Arrangements include referral to agencies that provide these services.  Your insurance has been verified to be:  [facility] Medicare Your primary doctor is:   NO PCP  Pertinent information will  be shared with your doctor and your insurance company.  Inpatient Rehabilitation Care Coordinator:  [name], [place] or [phone]  Information discussed with and copy given to patient by: Dyanne Iha, 09/20/2020, 11:26 AM

## 2020-09-20 NOTE — H&P (Signed)
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  Maintained on aspirin 81 mg daily and Brilinta 90 mg twice daily plan for 30 days then aspirin 81 mg daily and Plavix 75 mg daily.  He was discharged home ambulating minimal guard 150 feet without assistive device.  Per chart review patient lives with spouse and daughter.  1 level home with 2 steps to entry.  Presented 09/15/2020 to [facility] with nonspecific shortness of breath and worsening right side weakness.  MRI of the brain showed acute left basal ganglia infarction.  Evolving subacute posterior circulation infarct without extension from 09/07/2020.  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  MRA of the head showed diffuse intracranial atherosclerotic disease.  Occlusion right PCA.  Severe stenosis left PCA with distal flow.  This appeared to possibly have progressed since prior studies however difficult to compare given the amount of motion on the MRA.  CTA head neck no emergent large vessel occlusion.  There was an incidental finding of thyroid nodule 1.5 cm recommendations outpatient follow-up ultrasound.  Echocardiogram with ejection fraction of 60 to 65% no wall motion abnormalities.  TEE completed showing no PFO.  Admission chemistries unremarkable except CO2 of 20 BUN 33 creatinine 2.01, WBC 12,800, troponin 33-50, hemoglobin A1c 7.4.  Currently maintained on aspirin 81 mg daily as well as Brilinta 90 mg twice daily for CVA prophylaxis  for 30 days then back to aspirin and Plavix DAPT.  Subcutaneous Lovenox for DVT prophylaxis.  Refused loop recorder placement and ZIO patch added..  Tolerating a regular consistency diet.  Therapy evaluations completed due to patient's increasing right side weakness was admitted for a comprehensive rehab program.     Pt reports no issues swallowing- cannot move R side at all. Is having issues with dysarthria, not aphasia per pt/wife. Notes LBM was today- is continent of B/B per pt. Voiding well. Has had a stutter his whole life. Also normally "shakes his legs " voluntarily. Denies pain.    Review of Systems Constitutional:  Negative for chills and fever. HENT:  Negative for hearing loss.   Eyes:  Negative for blurred vision and double vision. Respiratory:  Positive for shortness of breath. Negative for cough.   Cardiovascular:  Positive for leg swelling. Negative for chest pain and palpitations. Gastrointestinal:  Negative for constipation, heartburn, nausea and vomiting. Genitourinary:  Negative for dysuria, flank pain and hematuria. Musculoskeletal:  Positive for joint pain and myalgias. Skin:  Negative for rash. Neurological:  Positive for speech change and weakness. Psychiatric/Behavioral:  The patient has insomnia.   All other systems reviewed and are negative.     Past Medical History:  Diagnosis Date   Asthma     Bronchitis     COPD (chronic obstructive pulmonary disease) ([place])     Diabetes mellitus without complication ([place])     Heart murmur [id number]    soft systolic murmur 1/6   Hypertension     Sleep apnea     Stroke [facility])           Past  Surgical History:  Procedure Laterality Date   COLONOSCOPY N/A 09/18/2016    Procedure: COLONOSCOPY;  Surgeon: Danie Binder, MD;  Location: [facility];  Service: Endoscopy;  Laterality: N/A;  10:30 AM   lipoma removal             Family History  Problem Relation Age of Onset   Stroke Mother     Heart attack Father 29         CABG   Hypertension Sister     Stroke Brother     Hypertension Brother     Stomach cancer Brother     Stroke Maternal Grandmother     Heart attack Maternal Grandfather     Heart attack Paternal Grandmother      Social History:  reports that he has been smoking cigarettes. He has been smoking an average of .25 packs per day. He has never used smokeless tobacco. He reports current alcohol use. He reports that he does not use drugs. He quit smoking 1 week before stroke- for a possible hernia surgery. Allergies:       Allergies  Allergen Reactions   Dust Mite Extract        Sneezing   Lisinopril Cough          Medications Prior to Admission  Medication Sig Dispense Refill   albuterol (VENTOLIN HFA) 108 (90 Base) MCG/ACT inhaler Inhale 2 puffs into the lungs every 6 (six) hours as needed for wheezing or shortness of breath. 18 g 8   aspirin EC 81 MG tablet Take 1 tablet (81 mg total) by mouth daily. 30 tablet 0   atorvastatin (LIPITOR) 80 MG tablet Take 1 tablet (80 mg total) by mouth daily. 90 tablet 1   chlorthalidone (HYGROTON) 25 MG tablet Take 1 tablet (25 mg total) by mouth daily. 30 tablet 0   Fluticasone-Umeclidin-Vilant (TRELEGY ELLIPTA) 100-62.5-25 MCG/INH AEPB Inhale 1 puff into the lungs daily. 60 each 8   gabapentin (NEURONTIN) 300 MG capsule Take 300 mg by mouth at bedtime.       insulin lispro (HUMALOG KWIKPEN) 100 UNIT/ML KwikPen Before each meal 3 times a day, 140-199 - 2 units, 200-250 - 4 units, 251-299 - 6 units,  300-349 - 8 units,  350 or above 10 units. Insulin PEN if approved, provide syringes and needles if needed. 15 mL 0   ipratropium-albuterol (DUONEB) 0.5-2.5 (3) MG/3ML SOLN Take 3 mLs by nebulization every 4 (four) hours as needed. 360 mL 6   nitroGLYCERIN (NITROSTAT) 0.4 MG SL tablet Place 1 tablet (0.4 mg total) under the tongue every 5 (five) minutes as needed for chest pain. 30 tablet 0   NON FORMULARY Pt uses cpap nightly       ticagrelor (BRILINTA) 90  MG TABS tablet Take 1 tablet (90 mg total) by mouth 2 (two) times daily. Start Plavix when you complete this for one month 60 tablet 0   traZODone (DESYREL) 100 MG tablet Take 100 mg by mouth at bedtime.          Drug Regimen Review Drug regimen was reviewed and remains appropriate with no significant issues identified   Home: Home Living Family/patient expects to be discharged to:: Private residence   Functional History:   Functional Status:  Mobility:   ADL:   Cognition: Cognition Orientation Level: Oriented X4   Physical Exam: Blood pressure (!) 151/103, pulse 62, temperature 98.5 F (36.9 C), temperature source Oral, resp. rate 18, height '5\' 7"'$  (1.702 m),  weight 99.5 kg, SpO2 97 %. Physical Exam Vitals and nursing note reviewed. Exam conducted with a chaperone present. Constitutional:      Appearance: He is obese.    Comments: Pt sitting up in bed; wife at bedside; legs shaking- using L to shake R- intermittently/voluntarily; NAD  HENT:    Head: Normocephalic and atraumatic.    Comments: R facial droop- no sensory changes/deficits; R tongue deviation    Right Ear: External ear normal.    Left Ear: External ear normal.    Nose: Nose normal. No congestion.    Mouth/Throat:    Mouth: Mucous membranes are moist.    Pharynx: Oropharynx is clear. No oropharyngeal exudate. Eyes:    Comments: Little nystagmus to L only- horizontal; wasn't able to look up well on L eye  Cardiovascular:    Rate and Rhythm: Normal rate and regular rhythm.    Heart sounds: Normal heart sounds. No murmur heard.   No gallop. Pulmonary:    Comments: CTA B/L- no W/R/R- good air movement   Abdominal:    Comments: Soft, NT, ND, (+)BS - hypoactive  Musculoskeletal:    Cervical back: Normal range of motion and neck supple.    Comments: RUE and RLE 0/5 in all muscles tested- deltoid, biceps, triceps, WE< grip and finger abd as well as HF, KE, KF DF and PF LUE and LLE 5/5 in same muscles  tested Kept using L leg to "move" R leg to shake them  Skin:    Comments: IV R forearm- looks good No skin breakdown seen- heels OK- wasn't able to see buttocks  Neurological:    Mental Status: He is alert.    Comments: Patient is alert makes eye contact with examiner.  Moderate dysarthria but intelligible.  Mild expressive language deficits.  Follows commands.  Provides name and age.   Slurred speech/dysarthria- might be aphasia, but if so, mild- but dysarthria noted Also has stutter which is lifelong- noted No decreased sensation to light touch in all 4 extremities   Psychiatric:        Mood and Affect: Mood normal.        Behavior: Behavior normal.        Thought Content: Thought content normal.        Judgment: Judgment normal.    Comments: Joking some      Lab Results Last 48 Hours        Results for orders placed or performed during the hospital encounter of 09/19/20 (from the past 48 hour(s))  Glucose, capillary     Status: Abnormal    Collection Time: 09/19/20  4:17 PM  Result Value Ref Range    Glucose-Capillary 119 (H) 70 - 99 mg/dL      Comment: Glucose reference range applies only to samples taken after fasting for at least 8 hours.       Imaging Results (Last 48 hours)  ECHO TEE   Result Date: 09/18/2020    TRANSESOPHOGEAL ECHO REPORT   Patient Name:   FARAJ OBORNY Date of Exam: 09/18/2020 Medical Rec #:  [id number]       Height:       67.0 in Accession #:    [id number]      Weight:       225.0 lb Date of Birth:  1965/09/25       BSA:          2.126 m Patient Age:    22 years  BP:           122/64 mmHg Patient Gender: M               HR:           78 bpm. Exam Location:  Inpatient Procedure: Transesophageal Echo, Cardiac Doppler, Color Doppler and Saline            Contrast Bubble Study Indications:     CVA  History:         Patient has prior history of Echocardiogram examinations, most                  recent 09/07/2020. CHF, COPD and Stroke, Signs/Symptoms:CKD;                   Risk Factors:Hypertension, Diabetes and Current Smoker.  Sonographer:     Dustin Flock Referring Phys:  Lyman Bishop MD Diagnosing Phys: Lyman Bishop MD PROCEDURE: The transesophogeal probe was passed without difficulty through the esophogus of the patient. Sedation performed by different physician. The patient was monitored while under deep sedation. Anesthestetic sedation was provided intravenously by Anesthesiology: 390.'09mg'$  of Propofol. The patient developed no complications during the procedure. IMPRESSIONS  1. Left ventricular ejection fraction, by estimation, is 60 to 65%. The left ventricle has normal function. The left ventricle has no regional wall motion abnormalities. There is severe left ventricular hypertrophy.  2. Right ventricular systolic function is normal. The right ventricular size is normal.  3. No left atrial/left atrial appendage thrombus was detected.  4. The mitral valve is grossly normal. Trivial mitral valve regurgitation.  5. The aortic valve is tricuspid. Aortic valve regurgitation is mild. Conclusion(s)/Recommendation(s): No LA/LAA thrombus identified. Negative bubble study for interatrial shunt. No intracardiac source of embolism detected on this on this transesophageal echocardiogram. FINDINGS  Left Ventricle: Left ventricular ejection fraction, by estimation, is 60 to 65%. The left ventricle has normal function. The left ventricle has no regional wall motion abnormalities. The left ventricular internal cavity size was normal in size. There is  severe left ventricular hypertrophy. Right Ventricle: The right ventricular size is normal. No increase in right ventricular wall thickness. Right ventricular systolic function is normal. Left Atrium: Left atrial size was normal in size. No left atrial/left atrial appendage thrombus was detected. Right Atrium: Right atrial size was normal in size. Pericardium: There is no evidence of pericardial effusion. Mitral Valve:  The mitral valve is grossly normal. Trivial mitral valve regurgitation. Tricuspid Valve: The tricuspid valve is grossly normal. Tricuspid valve regurgitation is trivial. Aortic Valve: The aortic valve is tricuspid. Aortic valve regurgitation is mild. Pulmonic Valve: The pulmonic valve was grossly normal. Pulmonic valve regurgitation is trivial. Aorta: The aortic root and ascending aorta are structurally normal, with no evidence of dilitation. IAS/Shunts: No atrial level shunt detected by color flow Doppler. Agitated saline contrast was given intravenously to evaluate for intracardiac shunting. Lyman Bishop MD Electronically signed by Lyman Bishop MD Signature Date/Time: 09/18/2020/2:16:58 PM    Final              Medical Problem List and Plan: 1.  Right side weakness/Hemiplegia with hemianopsia and dysarthria secondary to left CR infarction with evolving subacute posterior circulation infarcts. Refused loop recorder and ZIO patch added.             -patient may  shower             -ELOS/Goals: 14-21 days - min A to supervision -getting loop recorder  today 2.  Antithrombotics: -DVT/anticoagulation: Lovenox             -antiplatelet therapy: Aspirin 81 mg daily and Brilinta 90 mg twice daily for 30 days then back to aspirin and Plavix DAPT 3. Pain Management: Neurontin 300 mg nightly 4. Mood: Trazodone 100 mg nightly             -antipsychotic agents: N/A 5. Neuropsych: This patient is capable of making decisions on his own behalf. 6. Skin/Wound Care: Routine skin checks 7. Fluids/Electrolytes/Nutrition: Routine in and outs with follow-up chemistries 8.  Diabetes mellitus and peripheral neuropathy.  Hemoglobin A1c 7.4.  SSI.  Patient on Glucotrol 5 mg daily, Glucophage 500 mg twice daily prior to admission.  Resume as needed 8.  Permissive hypertension.  Currently on Hygroton 25 mg daily.  Patient also had been on hydralazine 50 mg twice daily, Imdur 60 mg daily Cozaar 100 mg daily prior to  admission.  Resume as needed 9.  CKD stage III.  Follow-up chemistries 10.  Hyperlipidemia.  Lipitor 11.  COPD/tobacco abuse.  Counseling.  Continue inhalers as directed. 12.  Diastolic congestive heart failure.  Monitor for any signs of fluid overload. 13.  Obesity.  BMI 35.25.  Dietary follow-up 14.  Incidental thyroid nodule measuring 1.5 cm.  Follow-up outpatient thyroid ultrasound.       Lavon Paganini Anguilli- PA-C 09/19/20   I have personally performed a face to face diagnostic evaluation of this patient and formulated the key components of the plan.  Additionally, I have personally reviewed laboratory data, imaging studies, as well as relevant notes and concur with the physician assistant's documentation above.   The patient's status has not changed from the original H&P.  Any changes in documentation from the acute care chart have been noted above.

## 2020-09-20 NOTE — Evaluation (Signed)
Occupational Therapy Assessment and Plan  Patient Details  Name: Craig Knight MRN: [id number] Date of Birth: 07-Aug-1965  OT Diagnosis: abnormal posture, acute pain, cognitive deficits, disturbance of vision, flaccid hemiplegia and hemiparesis, and muscle weakness (generalized) Rehab Potential: Rehab Potential (ACUTE ONLY): Excellent ELOS: 21-23 days   Today's Date: 09/20/2020 OT Individual Time: 5009-3818 OT Individual Time Calculation (min): 60 min     Hospital Problem: Principal Problem:   Infarction of left basal ganglia (HCC) Active Problems:   DM type 2 (diabetes mellitus, type 2) ([place])   CKD (chronic kidney disease), stage III ([place])   Past Medical History:  Past Medical History:  Diagnosis Date   Asthma    Bronchitis    COPD (chronic obstructive pulmonary disease) ([place])    Diabetes mellitus without complication ([place])    Heart murmur 29/93/7169   soft systolic murmur 1/6   Hypertension    Sleep apnea    Stroke [facility])    Past Surgical History:  Past Surgical History:  Procedure Laterality Date   COLONOSCOPY N/A 09/18/2016   Procedure: COLONOSCOPY;  Surgeon: Danie Binder, MD;  Location: [facility];  Service: Endoscopy;  Laterality: N/A;  10:30 AM   lipoma removal      Assessment & Plan Clinical Impression: Patient is a 55 y.o. year old male with recent admission to the hospital on 09/15/2020 to [facility] with nonspecific shortness of breath and worsening right side weakness.  MRI of the brain showed acute left basal ganglia infarction.  Evolving subacute posterior circulation infarct without extension from 09/07/2020.  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  MRA of the head showed diffuse intracranial atherosclerotic disease.  Occlusion right PCA.  Severe stenosis left PCA with distal flow.  Patient transferred to CIR on 09/19/2020 .    Patient currently requires max with basic self-care skills secondary to muscle  weakness and muscle paralysis, impaired timing and sequencing, unbalanced muscle activation, and decreased coordination, hemianopsia, decreased problem solving and decreased memory, and decreased sitting balance, decreased standing balance, decreased postural control, hemiplegia, and decreased balance strategies.  Prior to hospitalization, patient could complete ADLs with independent .  Patient will benefit from skilled intervention to decrease level of assist with basic self-care skills and increase independence with basic self-care skills prior to discharge home with care partner.  Anticipate patient will require minimal physical assistance and follow up outpatient.  OT - End of Session Activity Tolerance: Improving;Decreased this session Endurance Deficit: Yes Endurance Deficit Description: Pt reporting fatigue after transferring to the EOB. OT Assessment Rehab Potential (ACUTE ONLY): Excellent OT Patient demonstrates impairments in the following area(s): Balance;Safety;Cognition;Endurance;Pain;Vision OT Basic ADL's Functional Problem(s): Eating;Grooming;Bathing;Dressing;Toileting OT Transfers Functional Problem(s): Toilet OT Additional Impairment(s): Fuctional Use of Upper Extremity OT Plan OT Intensity: Minimum of 1-2 x/day, 45 to 90 minutes OT Frequency: 5 out of 7 days OT Duration/Estimated Length of Stay: 21-23 days OT Treatment/Interventions: Balance/vestibular training;Cognitive remediation/compensation;Neuromuscular re-education;Splinting/orthotics;Patient/family education;Functional mobility training;Discharge planning;Functional electrical stimulation;Pain management;Self Care/advanced ADL retraining;Therapeutic Activities;UE/LE Coordination activities;Therapeutic Exercise;Visual/perceptual remediation/compensation;UE/LE Strength taining/ROM;Wheelchair propulsion/positioning;DME/adaptive equipment instruction;Community reintegration OT Self Feeding Anticipated Outcome(s): modified  independent OT Basic Self-Care Anticipated Outcome(s): min assist OT Toileting Anticipated Outcome(s): min assist OT Bathroom Transfers Anticipated Outcome(s): min assist OT Recommendation Recommendations for Other Services: Therapeutic Recreation consult Therapeutic Recreation Interventions: Stress management Patient destination: Home Follow Up Recommendations: 24 hour supervision/assistance;Outpatient OT Equipment Recommended: To be determined   OT Evaluation Precautions/Restrictions  Precautions Precautions: Fall Precaution Comments: L hemianopia; Lt  hemiparesis Restrictions Weight Bearing Restrictions: No General   Vital Signs  Pain Pain Assessment Pain Scale: Faces Faces Pain Scale: Hurts little more Pain Type: Acute pain Pain Location: Hip Pain Orientation: Right Pain Descriptors / Indicators: Discomfort Pain Onset: With Activity Pain Intervention(s): Repositioned;Emotional support Home Living/Prior Functioning Home Living Family/patient expects to be discharged to:: Private residence Living Arrangements: Spouse/significant other, Children Available Help at Discharge: Family, Available 24 hours/day Type of Home: House Home Access: Stairs to enter [employer] of Steps: 2 Home Layout: One level Bathroom Shower/Tub: Multimedia programmer: Associate Professor Accessibility: Yes  Lives With: Spouse, Daughter IADL History Homemaking Responsibilities: Yes Meal Prep Responsibility: Secondary Laundry Responsibility: Secondary Current License: Yes Occupation: Unemployed Prior Function Level of Independence: Independent with basic ADLs (Prior to first CVA) Driving: Yes Vocation: On disability Vision Baseline Vision/History: No visual deficits Patient Visual Report: Blurring of vision Vision Assessment?: Yes Eye Alignment: Within Functional Limits Alignment/Gaze Preference: Within Defined Limits Tracking/Visual Pursuits: Able to track stimulus  in all quads without difficulty Visual Fields: Left homonymous hemianopsia Perception  Perception: Within Functional Limits Praxis Praxis: Intact Cognition Overall Cognitive Status: Impaired/Different from baseline Arousal/Alertness: Awake/alert Orientation Level: Person;Place;Situation Person: Oriented Place: Oriented Situation: Oriented Year: 2022 Month: July Day of Week: Correct (Tuesday) Memory: Impaired Memory Impairment: Decreased short term memory Decreased Short Term Memory: Functional basic Immediate Memory Recall: Sock;Blue;Bed Memory Recall Sock: Without Cue Memory Recall Blue: With Cue Memory Recall Bed: With Cue Attention: Focused;Sustained Focused Attention: Appears intact Sustained Attention: Appears intact Awareness: Appears intact Problem Solving: Impaired Problem Solving Impairment: Functional basic;Functional complex Safety/Judgment: Appears intact Sensation Sensation Light Touch: Appears Intact Hot/Cold: Appears Intact Proprioception: Appears Intact Stereognosis: Not tested Additional Comments: Light touch and proprioception intact in the RUE. Coordination Gross Motor Movements are Fluid and Coordinated: No Fine Motor Movements are Fluid and Coordinated: No Coordination and Movement Description: Pt with Brunnstrum stage I level in the arm and hand.  He needs max hand over hand assist for integration into selfcare tasks. Motor  Motor Motor: Hemiplegia;Abnormal postural alignment and control Motor - Skilled Clinical Observations: Severe RUE and RLE hemiparesis  Trunk/Postural Assessment  Cervical Assessment Cervical Assessment: Exceptions to [facility] (slight forward head) Thoracic Assessment Thoracic Assessment: Exceptions to [facility] (thoracic rounding) Lumbar Assessment Lumbar Assessment: Exceptions to [facility] (posterior pelvic tilt)  Balance Balance Balance Assessed: Yes Static Sitting Balance Static Sitting - Balance Support: Feet supported Static  Sitting - Level of Assistance: 5: Stand by assistance Dynamic Sitting Balance Dynamic Sitting - Balance Support: During functional activity Dynamic Sitting - Level of Assistance: 3: Mod assist Static Standing Balance Static Standing - Balance Support: During functional activity Static Standing - Level of Assistance: 2: Max assist Dynamic Standing Balance Dynamic Standing - Balance Support: During functional activity Dynamic Standing - Level of Assistance: 1: +2 Total assist Extremity/Trunk Assessment RUE Assessment RUE Assessment: Exceptions to [facility] Passive Range of Motion (PROM) Comments: WFLS for all joints General Strength Comments: Pt currently Brunnstrum stage I in the arm and hand with no functional movement noted. LUE Assessment LUE Assessment: Within Functional Limits Active Range of Motion (AROM) Comments: WFLs for selfcare tasks but not formally assessed General Strength Comments: At least 4/5 but not formally assessed  Care Tool Care Tool Self Care Eating   Eating Assist Level: Supervision/Verbal cueing    Oral Care    Oral Care Assist Level: Supervision/Verbal cueing    Bathing   Body parts bathed by patient: Chest;Abdomen;Right arm;Right upper leg;Left upper  leg;Face Body parts bathed by helper: Left arm;Right lower leg;Left lower leg;Front perineal area;Buttocks   Assist Level: Maximal Assistance - Patient 24 - 49%    Upper Body Dressing(including orthotics)   What is the patient wearing?: Pull over shirt   Assist Level: Maximal Assistance - Patient 25 - 49%    Lower Body Dressing (excluding footwear)   What is the patient wearing?: Pants;Underwear/pull up Assist for lower body dressing: Maximal Assistance - Patient 25 - 49%    Putting on/Taking off footwear   What is the patient wearing?: Non-skid slipper socks Assist for footwear: Dependent - Patient 0%       Care Tool Toileting Toileting activity   Assist for toileting: 2 Helpers     Care Tool Bed  Mobility Roll left and right activity        Sit to lying activity   Sit to lying assist level: Maximal Assistance - Patient 25 - 49%    Lying to sitting edge of bed activity   Lying to sitting edge of bed assist level: Maximal Assistance - Patient 25 - 49%     Care Tool Transfers Sit to stand transfer   Sit to stand assist level: Maximal Assistance - Patient 25 - 49%    Chair/bed transfer   Chair/bed transfer assist level: Maximal Assistance - Patient 25 - 49%     Toilet transfer   Assist Level: 2 [place] Expression of Ideas and Wants Expression of Ideas and Wants: Some difficulty - exhibits some difficulty with expressing needs and ideas (e.g, some words or finishing thoughts) or speech is not clear   Understanding Verbal and Non-Verbal Content Understanding Verbal and Non-Verbal Content: Usually understands - understands most conversations, but misses some part/intent of message. Requires cues at times to understand   Memory/Recall Ability *first 3 days only      Refer to Care Plan for Long Term Goals  SHORT TERM GOAL WEEK 1 OT Short Term Goal 1 (Week 1): Pt wll complete UB bathing at min assist sitting unsupported. OT Short Term Goal 2 (Week 1): Pt will complete UB dressing with min assist to donn a pullover shirt. OT Short Term Goal 3 (Week 1): Pt will complete LB bathing with mod assist sit to stand for two consecutive sessions. OT Short Term Goal 4 (Week 1): Pt will complete toilet transfers with mod assist squat pivot to the drop arm commode.  Recommendations for other services: Therapeutic Recreation  Stress management   Skilled Therapeutic Intervention ADL ADL Eating: Supervision/safety Where Assessed-Eating: Bed level Grooming: Minimal assistance Where Assessed-Grooming: Edge of bed Upper Body Bathing: Moderate assistance Where Assessed-Upper Body Bathing: Edge of bed Lower Body Bathing: Maximal assistance Where Assessed-Lower Body  Bathing: Edge of bed Upper Body Dressing: Maximal assistance Where Assessed-Upper Body Dressing: Edge of bed Lower Body Dressing: Maximal assistance Where Assessed-Lower Body Dressing: Edge of bed Toileting: Maximal assistance Where Assessed-Toileting: Bedside Commode Toilet Transfer: Dependent Toilet Transfer Method: Other (comment) (Stedy lift) Science writer: Geophysical data processor: Not assessed Mobility  Bed Mobility Bed Mobility: Rolling Left;Left Sidelying to Sit;Sit to Supine;Supine to Sit Rolling Left: Maximal Assistance - Patient 25-49% Left Sidelying to Sit: Minimal Assistance - Patient >75%;Dependent - mechanical lift Sit to Supine: Maximal Assistance - Patient 25-49% Transfers Sit to Stand: Maximal Assistance - Patient 25-49%  Session Note:  Pt in bed to start session with transfer to the left side of the bed with  max assist to begin session.  Supervision for static sitting balance while working on bathing tasks.  He was able to wash using the LUE only, no active movement in the RUE noted.  Max assist for sit to stand from the EOB with increased trunk flexion noted and slight pushing to the right noted.  Max assist for pulling underpants and pants over hips.  He needed max assist for standing to take 1-2 steps up toward the top of the bed with total assist to move the RLE.  Max assist for sit to supine as well with the RUE positioned on pillow for support.    Discharge Criteria: Patient will be discharged from OT if patient refuses treatment 3 consecutive times without medical reason, if treatment goals not met, if there is a change in medical status, if patient makes no progress towards goals or if patient is discharged from hospital.  The above assessment, treatment plan, treatment alternatives and goals were discussed and mutually agreed upon: by patient  Hideo Googe OTR/L 09/20/2020, 12:46 PM

## 2020-09-20 NOTE — Evaluation (Signed)
Physical Therapy Assessment and Plan  Patient Details  Name: Craig Knight MRN: [id number] Date of Birth: [birth year]  PT Diagnosis: Abnormal posture, Abnormality of gait, Cognitive deficits, Difficulty walking, Hemiplegia dominant, Hypotonia, Impaired cognition, Muscle weakness, Pain in R LE, and Paralysis Rehab Potential: Good ELOS: 3.5-4 weeks   Today's Date: 09/20/2020 PT Individual Time: 3664-4034 PT Individual Time Calculation (min): 74 min    Hospital Problem: Principal Problem:   Infarction of left basal ganglia (HCC) Active Problems:   DM type 2 (diabetes mellitus, type 2) ([place])   CKD (chronic kidney disease), stage III ([place])   Past Medical History:  Past Medical History:  Diagnosis Date   Asthma    Bronchitis    COPD (chronic obstructive pulmonary disease) ([place])    Diabetes mellitus without complication ([place])    Heart murmur 74/25/9563   soft systolic murmur 1/6   Hypertension    Sleep apnea    Stroke [facility])    Past Surgical History:  Past Surgical History:  Procedure Laterality Date   COLONOSCOPY N/A 09/18/2016   Procedure: COLONOSCOPY;  Surgeon: Danie Binder, MD;  Location: [facility];  Service: Endoscopy;  Laterality: N/A;  10:30 AM   lipoma removal      Assessment & Plan Clinical Impression: Patient is a 55 y.o. year old right-handed male with history of hypertension, obesity with BMI 35.25, COPD/tobacco abuse, diabetes mellitus, diastolic congestive heart failure CKD stage III with latest creatinine 2.40 as well as recent hospitalization 09/06/2020- 09/08/2020 for 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  Maintained on aspirin 81 mg daily and Brilinta 90 mg twice daily plan for 30 days then aspirin 81 mg daily and Plavix 75 mg daily.  He was discharged home ambulating minimal guard 150 feet without assistive device.  Per chart review patient lives with spouse and daughter.  1 level home with 2  steps to entry.  Presented 09/15/2020 to [facility] with nonspecific shortness of breath and worsening right side weakness.  MRI of the brain showed acute left basal ganglia infarction.  Evolving subacute posterior circulation infarct without extension from 09/07/2020.  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  MRA of the head showed diffuse intracranial atherosclerotic disease.  Occlusion right PCA.  Severe stenosis left PCA with distal flow.  This appeared to possibly have progressed since prior studies however difficult to compare given the amount of motion on the MRA.  CTA head neck no emergent large vessel occlusion.  There was an incidental finding of thyroid nodule 1.5 cm recommendations outpatient follow-up ultrasound.  Echocardiogram with ejection fraction of 60 to 65% no wall motion abnormalities.  TEE completed showing no PFO.  Admission chemistries unremarkable except CO2 of 20 BUN 33 creatinine 2.01, WBC 12,800, troponin 33-50, hemoglobin A1c 7.4.  Currently maintained on aspirin 81 mg daily as well as Brilinta 90 mg twice daily for CVA prophylaxis for 30 days then back to aspirin and Plavix DAPT.  Subcutaneous Lovenox for DVT prophylaxis.  Refused loop recorder placement and ZIO patch added..  Tolerating a regular consistency diet.  Therapy evaluations completed due to patient's increasing right side weakness was admitted for a comprehensive rehab program. Patient transferred to CIR on 09/19/2020 .   Patient currently requires +2 total assist with mobility secondary to muscle weakness and muscle paralysis, decreased cardiorespiratoy endurance, impaired timing and sequencing, abnormal tone, unbalanced muscle activation, decreased coordination, and decreased motor planning, field cut,  ,  decreased attention, decreased awareness, decreased problem solving, decreased safety awareness, decreased memory, and delayed processing, and decreased sitting balance,  decreased standing balance, decreased postural control, hemiplegia, and decreased balance strategies.  Prior to hospitalization, patient was independent  with mobility and lived with Spouse, Daughter in a House home.  Home access is 2Stairs to enter.  Patient will benefit from skilled PT intervention to maximize safe functional mobility, minimize fall risk, and decrease caregiver burden for planned discharge home with 24 hour assist.  Anticipate patient will benefit from follow up [facility] at discharge.  PT - End of Session Activity Tolerance: Tolerates 30+ min activity with multiple rests Endurance Deficit: Yes Endurance Deficit Description: Pt quickly fatigues and becomes SOB with minimal activity PT Assessment Rehab Potential (ACUTE/IP ONLY): Good PT Barriers to Discharge: Inaccessible home environment;Home environment access/layout PT Patient demonstrates impairments in the following area(s): Balance;Safety;Behavior;Sensory;Edema;Skin Integrity;Endurance;Motor;Nutrition;Pain;Perception PT Transfers Functional Problem(s): Bed Mobility;Bed to Chair;Car;Furniture PT Locomotion Functional Problem(s): Ambulation;Wheelchair Mobility;Stairs PT Plan PT Intensity: Minimum of 1-2 x/day ,45 to 90 minutes PT Frequency: 5 out of 7 days PT Duration Estimated Length of Stay: 3.5-4 weeks PT Treatment/Interventions: Ambulation/gait training;Community reintegration;DME/adaptive equipment instruction;Neuromuscular re-education;Psychosocial support;Stair training;UE/LE Strength taining/ROM;Wheelchair propulsion/positioning;Balance/vestibular training;Discharge planning;Functional electrical stimulation;Pain management;Skin care/wound management;Therapeutic Activities;UE/LE Coordination activities;Cognitive remediation/compensation;Disease management/prevention;Functional mobility training;Patient/family education;Splinting/orthotics;Therapeutic Exercise;Visual/perceptual remediation/compensation PT Transfers  Anticipated Outcome(s): CGA using LRAD PT Locomotion Anticipated Outcome(s): mod assist using LRAD PT Recommendation Follow Up Recommendations: Home health PT;24 hour supervision/assistance Patient destination: Home Equipment Recommended: To be determined   PT Evaluation Precautions/Restrictions Precautions Precautions: Fall;Other (comment) Precaution Comments: L hemianopsia; dense R hemiparesis; Zio Patch Restrictions Weight Bearing Restrictions: No Pain Pain Assessment Pain Scale: 0-10 Pain Score: 0-No pain Home Living/Prior Functioning Home Living Available Help at Discharge: Family;Available 24 hours/day Type of Home: House Home Access: Stairs to enter [employer] of Steps: 2 Entrance Stairs-Rails: None Home Layout: One level Bathroom Shower/Tub: Multimedia programmer: Standard Bathroom Accessibility: Yes  Lives With: Spouse;Daughter (wife, Craig Knight provide 24hr support with daughter providing PRN assist as she works full time) Prior Function Level of Independence: Independent with gait;Independent with transfers  Able to Take Stairs?: Yes Driving: Yes Vocation: On disability Comments: Pt reports ambulating independently following his most recent admission and CVAs. Perception  Perception Perception: Within Functional Limits Praxis Praxis: Intact  Cognition Overall Cognitive Status: Impaired/Different from baseline Arousal/Alertness: Awake/alert Orientation Level: Oriented X4 Attention: Focused;Sustained Focused Attention: Appears intact Sustained Attention: Impaired Memory: Impaired Awareness: Impaired Problem Solving: Impaired Safety/Judgment: Impaired Sensation Sensation Light Touch: Appears Intact Hot/Cold: Not tested Proprioception: Impaired by gross assessment Stereognosis: Not tested Coordination Gross Motor Movements are Fluid and Coordinated: No Coordination and Movement Description: GM movements impaired due to dense R  hemiparesis with slight to no muscle activation in R LE Heel Shin Test: unable to perform with R LE due to plegia Motor  Motor Motor: Hemiplegia;Abnormal postural alignment and control;Abnormal tone Motor - Skilled Clinical Observations: Severe RUE and RLE hemiplegia   Trunk/Postural Assessment  Cervical Assessment Cervical Assessment: Exceptions to [facility] (slight forward head) Thoracic Assessment Thoracic Assessment: Exceptions to [facility] (rounded shoulders) Lumbar Assessment Lumbar Assessment: Exceptions to [facility] (posterior pelvic tilt in sitting)  Balance Balance Balance Assessed: Yes Standardized Balance Assessment Standardized Balance Assessment: PASS Static Sitting Balance Static Sitting - Balance Support: Feet supported Static Sitting - Level of Assistance: 5: Stand by assistance Dynamic Sitting Balance Dynamic Sitting - Balance Support: During functional activity Dynamic Sitting - Level of Assistance: 3: Mod assist Static Standing Balance Static  Standing - Balance Support: During functional activity Static Standing - Level of Assistance: 2: Max assist Dynamic Standing Balance Dynamic Standing - Balance Support: During functional activity Dynamic Standing - Level of Assistance: 1: +2 Total assist  Postural Assessment Scale for Stroke Patients (PASS)  Give the subject instructions for each item as written below. When scoring the item, record the lowest response category that applies for each item.  Maintaining a Posture  _3_ 1. Sitting Without Support Instructions: Have the subject sit on a bench/mat without back support and with feet flat on the floor. (3) Can sit for 5 minutes without support (2) Can sit for more than 10 seconds without support (1) Can sit with slight support (for example, by 1 hand) (0) Cannot sit  _2_ 2. Standing With Support Instructions: Have the subject stand, providing support as needed. Evaluate only the ability to stand with or without support. Do  not consider the quality of the stance. (3) Can stand with support of only 1 hand (2) Can stand with moderate support of 1 person (1) Can stand with strong support of 2 people (0) Cannot stand, even with support  _0_ 3. Standing Without Support Instructions: Have the subject stand without support. Evaluate only the ability to stand with or without support. Do not consider the quality of the stance. (3) Can stand without support for more than 1 minute and simultaneously perform arm movements at about shoulder level (2) Can stand without support for 1 minute or stands slightly asymmetrically  (1) Can stand without support for 10 seconds or leans heavily on 1 leg (0) Cannot stand without support  _0_ 4. Standing on Nonparetic Leg Instructions: Have the subject stand on the nonparetic leg. Evaluate only the ability to bear weight entirely on the nonparetic leg. Do not consider how the subject accomplishes the task. (3) Can stand on nonparetic leg for more than 10 seconds (2) Can stand on nonparetic leg for more than 5 seconds (1) Can stand on nonparetic leg for a few seconds (0) Cannot stand on nonparetic leg  _0_ 5. Standing on Paretic Leg Instructions: Have the subject stand on the paretic leg. Evaluate only the ability to bear weight entirely on the paretic leg. Do not consider how the subject accomplishes the task. (3) Can stand on paretic leg for more than 10 seconds (2) Can stand on paretic leg for more than 5 seconds (1) Can stand on paretic leg for a few seconds (0) Cannot stand on paretic leg  Maintaining Posture SUBTOTAL __5__  Changing a Posture  _1_ 6. Supine to Paretic Side Lateral Instructions: Begin with the subject in supine on a treatment mat. Instruct the subject to roll to the paretic side (lateral movement). Assist as necessary. Evaluate the subject's performance on the amount of help required. Do not consider the quality of performance. (3) Can perform without  help (2) Can perform with little help (1) Can perform with much help (0) Cannot perform  _1_ 7. Supine to Nonparetic Side Lateral Instructions: Begin with the subject in supine on a treatment mat. Instruct the subject to roll to the nonparetic side (lateral movement). Assist as necessary. Evaluate the subject's performance on the amount of help required. Do not consider the quality of performance. (3) Can perform without help (2) Can perform with little help (1) Can perform with much help (0) Cannot perform  _1_ 8. Supine to Sitting Up on the Edge of the Mat Instructions: Begin with the subject in supine on  a treatment mat. Instruct the subject to come to sitting on the edge of the mat. Assist as necessary. Evaluate the subject's performance on the amount of help required. Do not consider the quality of performance. (3) Can perform without help (2) Can perform with little help (1) Can perform with much help (0) Cannot perform  _1_ 9. Sitting on the Edge of the Mat to Supine Instructions: Begin with the on the edge of a treatment mat. Instruct the subject to return to supine. Assist as necessary. Evaluate the subject's performance on the amount of help required. Do not consider the quality of performance. (3) Can perform without help (2) Can perform with little help (1) Can perform with much help (0) Cannot perform  _1_ 10. Sitting to Standing Up Instructions: Begin with the subject sitting on the edge of a treatment mat. Instruct the subject to stand up without support. Assist if necessary. Evaluate the subject's performance on the amount of help required. Do not consider the quality of performance. (3) Can perform without help (2) Can perform with little help (1) Can perform with much help (0) Cannot perform  _1_ 11. Standing Up to Sitting Down Instructions: Begin with the subject standing by edge of a treatment mat. Instruct the subject to sit on edge of mat without support.  Assist if necessary. Evaluate the subject's performance on the amount of help required. Do not consider the quality of performance. (3) Can perform without help (2) Can perform with little help (1) Can perform with much help (0) Cannot perform  _0_ 12. Standing, Picking Up a Pencil from the Floor Instructions: Begin with the subject standing. Instruct the subject to pick up a pencil fro the floor without support. Assist if necessary. Evaluate the subject's performance on the amount of help required. Do not consider the quality of performance. (3) Can perform without help (2) Can perform with little help (1) Can perform with much help (0) Cannot perform  Changing Posture SUBTOTAL _6_   TOTAL __11___  Extremity Assessment  RLE Assessment RLE Assessment: Exceptions to [facility] RLE Strength Right Hip Flexion: 0/5 Right Hip Extension: 1/5 (during functional WBing task but no in open chain) Right Knee Flexion: 0/5 Right Knee Extension: 1/5 (during functional WBing task but no in open chain) Right Ankle Dorsiflexion: 0/5 Right Ankle Plantar Flexion: 0/5 RLE Tone RLE Tone: Severe;Hypotonic LLE Assessment LLE Assessment: Within Functional Limits Active Range of Motion (AROM) Comments: WFL General Strength Comments: grossly 4+/5 to 5/5 assessd in supine and functionally  Care Tool Care Tool Bed Mobility Roll left and right activity   Roll left and right assist level: Maximal Assistance - Patient 25 - 49%    Sit to lying activity   Sit to lying assist level: Total Assistance - Patient < 25%    Lying to sitting edge of bed activity   Lying to sitting edge of bed assist level: Total Assistance - Patient < 25%     Care Tool Transfers Sit to stand transfer   Sit to stand assist level: Dependent - Patient 0% (stedy)    Chair/bed transfer   Chair/bed transfer assist level: Dependent - mechanical lift (stedy)     Mining engineer transfer activity did not  occur: Safety/medical concerns        Care Tool Locomotion Ambulation Ambulation activity did not occur: Safety/medical concerns        Walk 10 feet activity Walk 10 feet activity  did not occur: Safety/medical concerns       Walk 50 feet with 2 turns activity Walk 50 feet with 2 turns activity did not occur: Safety/medical concerns      Walk 150 feet activity Walk 150 feet activity did not occur: Safety/medical concerns      Walk 10 feet on uneven surfaces activity Walk 10 feet on uneven surfaces activity did not occur: Safety/medical concerns      Stairs Stair activity did not occur: Safety/medical concerns        Walk up/down 1 step activity Walk up/down 1 step or curb (drop down) activity did not occur: Safety/medical concerns     Walk up/down 4 steps activity did not occuR: Safety/medical concerns  Walk up/down 4 steps activity      Walk up/down 12 steps activity Walk up/down 12 steps activity did not occur: Safety/medical concerns      Pick up small objects from floor Pick up small object from the floor (from standing position) activity did not occur: Safety/medical concerns      Wheelchair Will patient use wheelchair at discharge?:  (TBD)          Wheel 50 feet with 2 turns activity      Wheel 150 feet activity        Refer to Care Plan for Long Term Goals  SHORT TERM GOAL WEEK 1 PT Short Term Goal 1 (Week 1): Pt will perform supine<>sit with mod assist PT Short Term Goal 2 (Week 1): Pt will perform sit<>stands with mod assist PT Short Term Goal 3 (Week 1): Pt will perform bed<>chair transfers with mod assist PT Short Term Goal 4 (Week 1): Pt will ambulate at least 74f using LRAD with max assist of 1 and +2 for safety  Recommendations for other services: None   Skilled Therapeutic Intervention Pt received supine in bed and agreeable to therapy session - pt eager to sit upright. Evaluation completed (see details above) with patient education regarding  purpose of PT evaluation, PT POC and goals, therapy schedule, weekly team meetings, and other CIR information including safety plan and fall risk safety. Pt performed the below mobility tasks with the specified levels of assistance. Therapist providing multimodal cuing throughout session for motor planning, sequencing, and increased involvement of R hemibody to promote normalized movement patterns. Squat pivot transfers performed throughout session with max assist for lifting and pivoting hips while promoting increased R LE WBing. Unable to safely advance to gait training at this time due to dense R LE plegia and no +2 assistance available. Therapy session focused on R LE NMR via [place] having pt perform sit<>stands to/from EOM and progress to standing mini-squats with dual-task challenge of placing horeshoes onto the mirror to promote increased trunk/hip/knee extension. Able to palpate trace R quad activation during transition from sitting>standing. At end of session performed stedy transfer w/c>recliner to ensure safety with this mechanical lift to be performed with +2 nursing assistance - safety plan updated accordingly. Pt left sitting in recliner with needs in reach, seat belt alarm on, and pt's wife present.   Mobility Bed Mobility Bed Mobility: Rolling Right;Rolling Left;Sit to Supine;Supine to Sit Rolling Right: Maximal Assistance - Patient 25-49% Rolling Left: Total Assistance - Patient < 25%;Maximal Assistance - Patient 25-49% Supine to Sit: Maximal Assistance - Patient - Patient 25-49% Sit to Supine: Maximal Assistance - Patient 25-49% Transfers Transfers: Sit to Stand;Stand to Sit;Squat Pivot Transfers Sit to Stand: Maximal Assistance - Patient 25-49% Stand to  Sit: Maximal Assistance - Patient 25-49% Squat Pivot Transfers: Maximal Assistance - Patient 25-49% Transfer (Assistive device): None Locomotion  Gait Ambulation: No Gait Gait: No Stairs / Additional Locomotion Stairs:  No Wheelchair Mobility Wheelchair Mobility: No   Discharge Criteria: Patient will be discharged from PT if patient refuses treatment 3 consecutive times without medical reason, if treatment goals not met, if there is a change in medical status, if patient makes no progress towards goals or if patient is discharged from hospital.  The above assessment, treatment plan, treatment alternatives and goals were discussed and mutually agreed upon: by patient and by family  Tawana Scale , PT, DPT, NCS, CSRS 09/20/2020, 3:23 PM

## 2020-09-20 NOTE — Progress Notes (Addendum)
PROGRESS NOTE   Subjective/Complaints:  No new issues some coughing with meal, discussed that SLP will assess today . No SOB or resp distress  ROS- neg CP, - bowel or bladder issues  Objective:   ECHO TEE  Result Date: 09/18/2020    TRANSESOPHOGEAL ECHO REPORT   Patient Name:   Craig Knight Date of Exam: 09/18/2020 Medical Rec #:  [id number]       Height:       67.0 in Accession #:    [id number]      Weight:       225.0 lb Date of Birth:  01/26/1966       BSA:          2.126 m Patient Age:    55 years        BP:           122/64 mmHg Patient Gender: M               HR:           78 bpm. Exam Location:  Inpatient Procedure: Transesophageal Echo, Cardiac Doppler, Color Doppler and Saline            Contrast Bubble Study Indications:     CVA  History:         Patient has prior history of Echocardiogram examinations, most                  recent 09/07/2020. CHF, COPD and Stroke, Signs/Symptoms:CKD;                  Risk Factors:Hypertension, Diabetes and Current Smoker.  Sonographer:     Dustin Flock Referring Phys:  Lyman Bishop MD Diagnosing Phys: Lyman Bishop MD PROCEDURE: The transesophogeal probe was passed without difficulty through the esophogus of the patient. Sedation performed by different physician. The patient was monitored while under deep sedation. Anesthestetic sedation was provided intravenously by Anesthesiology: 390.'09mg'$  of Propofol. The patient developed no complications during the procedure. IMPRESSIONS  1. Left ventricular ejection fraction, by estimation, is 60 to 65%. The left ventricle has normal function. The left ventricle has no regional wall motion abnormalities. There is severe left ventricular hypertrophy.  2. Right ventricular systolic function is normal. The right ventricular size is normal.  3. No left atrial/left atrial appendage thrombus was detected.  4. The mitral valve is grossly normal. Trivial mitral  valve regurgitation.  5. The aortic valve is tricuspid. Aortic valve regurgitation is mild. Conclusion(s)/Recommendation(s): No LA/LAA thrombus identified. Negative bubble study for interatrial shunt. No intracardiac source of embolism detected on this on this transesophageal echocardiogram. FINDINGS  Left Ventricle: Left ventricular ejection fraction, by estimation, is 60 to 65%. The left ventricle has normal function. The left ventricle has no regional wall motion abnormalities. The left ventricular internal cavity size was normal in size. There is  severe left ventricular hypertrophy. Right Ventricle: The right ventricular size is normal. No increase in right ventricular wall thickness. Right ventricular systolic function is normal. Left Atrium: Left atrial size was normal in size. No left atrial/left atrial appendage thrombus was detected. Right Atrium: Right atrial size was normal in size. Pericardium:  There is no evidence of pericardial effusion. Mitral Valve: The mitral valve is grossly normal. Trivial mitral valve regurgitation. Tricuspid Valve: The tricuspid valve is grossly normal. Tricuspid valve regurgitation is trivial. Aortic Valve: The aortic valve is tricuspid. Aortic valve regurgitation is mild. Pulmonic Valve: The pulmonic valve was grossly normal. Pulmonic valve regurgitation is trivial. Aorta: The aortic root and ascending aorta are structurally normal, with no evidence of dilitation. IAS/Shunts: No atrial level shunt detected by color flow Doppler. Agitated saline contrast was given intravenously to evaluate for intracardiac shunting. Lyman Bishop MD Electronically signed by Lyman Bishop MD Signature Date/Time: 09/18/2020/2:16:58 PM    Final    Recent Labs    09/18/20 0439 09/20/20 0519  WBC 8.8 10.3  HGB 14.0 14.8  HCT 43.1 43.2  PLT 212 211   Recent Labs    09/18/20 0439 09/20/20 0519  NA 136 136  K 3.6 3.9  CL 105 104  CO2 24 24  GLUCOSE 100* 98  BUN 33* 23*  CREATININE  1.87* 1.57*  CALCIUM 8.8* 9.2    Intake/Output Summary (Last 24 hours) at 09/20/2020 0744 Last data filed at 09/19/2020 2053 Gross per 24 hour  Intake 260 ml  Output 300 ml  Net -40 ml        Physical Exam: Vital Signs Blood pressure (!) 170/98, pulse 61, temperature 97.8 F (36.6 C), temperature source Oral, resp. rate 18, height '5\' 7"'$  (1.702 m), weight 99.5 kg, SpO2 95 %.    Assessment/Plan: 1. Functional deficits which require 3+ hours per day of interdisciplinary therapy in a comprehensive inpatient rehab setting. Physiatrist is providing close team supervision and 24 hour management of active medical problems listed below. Physiatrist and rehab team continue to assess barriers to discharge/monitor patient progress toward functional and medical goals  Care Tool:  Bathing              Bathing assist       Upper Body Dressing/Undressing Upper body dressing        Upper body assist Assist Level: Moderate Assistance - Patient 50 - 74%    Lower Body Dressing/Undressing Lower body dressing            Lower body assist       Toileting Toileting    Toileting assist Assist for toileting: Supervision/Verbal cueing     Transfers Chair/bed transfer  Transfers assist           Locomotion Ambulation   Ambulation assist              Walk 10 feet activity   Assist           Walk 50 feet activity   Assist           Walk 150 feet activity   Assist           Walk 10 feet on uneven surface  activity   Assist           Wheelchair     Assist               Wheelchair 50 feet with 2 turns activity    Assist            Wheelchair 150 feet activity     Assist          Blood pressure (!) 170/98, pulse 61, temperature 97.8 F (36.6 C), temperature source Oral, resp. rate 18, height '5\' 7"'$  (1.702 m), weight 99.5 kg, SpO2 95 %.  A/P  1.  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 Refused loop recorder and ZIO patch added.              -patient may  shower              -ELOS/Goals: 14-21 days - min A to supervision Rehab evals today   2.  Antithrombotics:  -DVT/anticoagulation: Lovenox              -antiplatelet therapy: Aspirin 81 mg daily and Brilinta 90 mg twice daily for 30 days then back to aspirin and Plavix DAPT  3. Pain Management: Neurontin 300 mg nightly   4. Mood: Trazodone 100 mg nightly              -antipsychotic agents: N/A  5. Neuropsych: This patient is capable of making decisions on his own behalf.  6. Skin/Wound Care: Routine skin checks  7. Fluids/Electrolytes/Nutrition: Routine in and outs with follow-up chemistries  8.  Diabetes mellitus and peripheral neuropathy.  Hemoglobin A1c 7.4.  SSI.  Patient on Glucotrol 5 mg daily, Glucophage 500 mg twice daily prior to admission.  Resume as needed CBG (last 3)  Recent Labs    09/19/20 1617 09/19/20 2027 09/20/20 0635  GLUCAP 119* 95 93     8.  Permissive hypertension.  Currently on Hygroton 25 mg daily.  Patient also had been on hydralazine 50 mg twice daily, Imdur 60 mg daily Cozaar 100 mg daily prior to admission.  Resume as needed  9.  CKD stage III.  Follow-up chemistries  10.  Hyperlipidemia.  Lipitor  11.  COPD/tobacco abuse.  Counseling.  Continue inhalers as directed.  12.  Diastolic congestive heart failure.  Monitor for any signs of fluid overload.  13.  Obesity.  BMI 35.25.  Dietary follow-up  14.  Incidental thyroid nodule measuring 1.5 cm.  Follow-up outpatient thyroid ultrasound 8.  Diabetes mellitus and peripheral neuropathy.  Hemoglobin A1c 7.4.  SSI.  Patient on Glucotrol 5 mg daily, Glucophage 500 mg twice daily prior to admission.  Resume as needed  8.  Permissive hypertension.  Currently on Hygroton 25 mg daily.  Patient also had been on  hydralazine 50 mg twice daily, Imdur 60 mg daily Cozaar 100 mg daily prior to admission.  Resume as needed Vitals:   09/19/20 2100 09/20/20 0510  BP: (!) 152/96 (!) 170/98  Pulse:  61  Resp:  18  Temp:  97.8 F (36.6 C)  SpO2:  95%  Permissive HT for now   9.  CKD stage III.  Follow-up chemistries  10.  Hyperlipidemia.  Lipitor  11.  COPD/tobacco abuse.  Counseling.  Continue inhalers as directed.  12.  Diastolic congestive heart failure.  Monitor for any signs of fluid overload.  13.  Obesity.  BMI 35.25.  Dietary follow-up  14.  Incidental thyroid nodule measuring 1.5 cm.  Follow-up outpatient thyroid ultrasoundior to admission.  Resume as needed   9.  CKD stage III.  Follow-up chemistries- at baseline 10.  Hyperlipidemia.  Lipitor 11.  COPD/tobacco abuse.  Counseling.  Continue inhalers as directed. 12.  Diastolic congestive heart failure.  Monitor for any signs of fluid overload. 13.  Obesity.  BMI 35.25.  Dietary follow-up 14.  Incidental thyroid nodule measuring 1.5 cm.  Follow-up outpatient thyroid ultrasound    LOS: 1 days A FACE TO FACE EVALUATION WAS PERFORMED  Mitzi Hansen  E Rudi Bunyard 09/20/2020, 7:44 AM

## 2020-09-20 NOTE — Patient Care Conference (Signed)
Inpatient RehabilitationTeam Conference and Plan of Care Update Date: 09/20/2020   Time: 10:45 AM    Patient Name: Craig Knight      Medical Record Number: [id number]  Date of Birth: 1965/08/30 Sex: Male         Room/Bed: 4M05C/4M05C-01 Payor Info: Payor: [employer] / Plan: [employer] MEDICARE / Product Type: *No Product type* /    Admit Date/Time:  09/19/2020  3:19 PM  Primary Diagnosis:  Infarction of left basal ganglia [facility])  Hospital Problems: Principal Problem:   Infarction of left basal ganglia (HCC) Active Problems:   DM type 2 (diabetes mellitus, type 2) ([place])   CKD (chronic kidney disease), stage III ([place])    Expected Discharge Date: Expected Discharge Date:  (ELOS 3 weeks; evals pending)  Team Members Present: Physician leading conference: Dr. Alysia Penna Social Worker Present: [name], [place] Nurse Present: Dorien Chihuahua, RN PT Present: Page Spiro, PT OT Present: Clyda Greener, OT SLP Present: Sherren Kerns, SLP PPS Coordinator present : Gunnar Fusi, SLP     Current Status/Progress Goal Weekly Team Focus  Bowel/Bladder   Continent of bowel and bladder, LBM-09/19/20  Remain continent  Assess and address B/B, assist with toileting needs   Swallow/Nutrition/ Hydration             ADL's             Mobility   eval pending         Communication             Safety/Cognition/ Behavioral Observations  eval pending  eval pending  eval pending   Pain   Denies pain  Remain pain free  Assess and address pain every shift and as needed   Skin   No skin impairment noted at present.  Maintain skin integrity        Discharge Planning:  TBA   Team Discussion: ZIO patch post stroke. Recurrent strokes x 2 within the past month with left hemianopsia from previous stroke. DAPT with Brilinta then back to Plavix. BP remains elevated; monitoring.  Patient on target to meet rehab goals: yes, currently max assist for sitting balance and mod  - max assist for bathing and dressing. Patient has good sensation in right upper extremity without mobility. Reports left hip pain with flexion.   *See Care Plan and progress notes for long and short-term goals.   Revisions to Treatment Plan:  Evals incomplete at the time of team conference  Teaching Needs: Medications, secondary stroke risk management, transfers, toileting, etc  Current Barriers to Discharge: Decreased caregiver support and Home enviroment access/layout  Possible Resolutions to Barriers: Family education     Medical Summary Current Status: Labile HTN, cough with swallow, dense Right  Hemiparesis, RIght hip pain, Left HH  Barriers to Discharge: Medical stability   Possible Resolutions to Barriers/Weekly Focus: will need med management for HTN   Continued Need for Acute Rehabilitation Level of Care: The patient requires daily medical management by a physician with specialized training in physical medicine and rehabilitation for the following reasons: Direction of a multidisciplinary physical rehabilitation program to maximize functional independence : Yes Medical management of patient stability for increased activity during participation in an intensive rehabilitation regime.: Yes Analysis of laboratory values and/or radiology reports with any subsequent need for medication adjustment and/or medical intervention. : Yes   I attest that I was present, lead the team conference, and concur with the assessment and plan of the team.  Dorien Chihuahua B 09/20/2020, 5:17 PM

## 2020-09-20 NOTE — Progress Notes (Signed)
Inpatient Rehabilitation  Patient information reviewed and entered into eRehab system by Polly Barner Wayburn Shaler, OTR/L.   Information including medical coding, functional ability and quality indicators will be reviewed and updated through discharge.    

## 2020-09-20 NOTE — Progress Notes (Signed)
Patient slept fairly well, restlessness noted until mid shift. Turned and repositioned throughout the night, due to inability to perform on own. Denies pain. Requiring prompting to use urinal this am. Pt did not utilize call light to make needs known. Hourly rounding performed and pts needs met at this time. Pt's BP 170/98 this am, asymptomatic.

## 2020-09-20 NOTE — Evaluation (Signed)
Speech Language Pathology Assessment and Plan  Patient Details  Name: Craig Knight MRN: [id number] Date of Birth: [birth year]  SLP Diagnosis: Dysarthria;Cognitive Impairments;Speech and Language deficits  Rehab Potential: Good ELOS: 21-23   Today's Date: 09/20/2020 SLP Individual Time: 6578-4696 SLP Individual Time Calculation (min): 51 min and Today's Date: 09/20/2020 SLP Missed Time: 9 Minutes Missed Time Reason:  (Scheduling conflict)  Hospital Problem: Principal Problem:   Infarction of left basal ganglia (HCC) Active Problems:   DM type 2 (diabetes mellitus, type 2) ([place])   CKD (chronic kidney disease), stage III ([place])  Past Medical History:  Past Medical History:  Diagnosis Date   Asthma    Bronchitis    COPD (chronic obstructive pulmonary disease) ([place])    Diabetes mellitus without complication ([place])    Heart murmur 29/52/8413   soft systolic murmur 1/6   Hypertension    Sleep apnea    Stroke [facility])    Past Surgical History:  Past Surgical History:  Procedure Laterality Date   COLONOSCOPY N/A 09/18/2016   Procedure: COLONOSCOPY;  Surgeon: Danie Binder, MD;  Location: [facility];  Service: Endoscopy;  Laterality: N/A;  10:30 AM   lipoma removal      Assessment / Plan / Recommendation Clinical Impression Craig Knight is a 55 year old right-handed male with history of hypertension, obesity with BMI 35.25, COPD/tobacco abuse, diabetes mellitus, diastolic congestive heart failure CKD stage III with latest creatinine 2.40 as well as recent hospitalization 09/06/2020- 09/08/2020 for 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 Per chart review patient lives with spouse and daughter.  1 level home with 2 steps to entry.  Presented 09/15/2020 to [facility] with nonspecific shortness of breath and worsening right side weakness.  MRI of the brain showed acute left basal ganglia infarction.  Evolving  subacute posterior circulation infarct without extension from 09/07/2020.  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  MRA of the head showed diffuse intracranial atherosclerotic disease.  Occlusion right PCA.  Severe stenosis left PCA with distal flow.  This appeared to possibly have progressed since prior studies however difficult to compare given the amount of motion on the MRA.  CTA head neck no emergent large vessel occlusion. Tolerating a regular consistency diet.  Therapy evaluations completed due to patient's increasing right side weakness was admitted for a comprehensive rehab program.  Patient seen for speech, language, cognitive-linguistic, and swallowing assessment in setting of acute left basal ganglia infarct and evolving right PCA CVA. Family was not present to contribute to case history and premorbid status therefore unsure of baseline cognition considering hx of multiple old infarcts. Per supporting documentation, patient exhibited stuttering at baseline which was not observed during today's evaluation. Patient exhibits moderate dysarthria impacting speech intelligibility at the sentence and conversation level. Patient unable to recall strategies discussed in previous ST encounters while in acute care. He also exhibited mild expressive language deficits characterized by decreased confrontational naming and occasional word finding difficulty during structured tasks. Receptive language appeared grossly intact.   Administered the [facility] Mental Status (SLUMS). Score: 10/24 (modified score to omit executive functions/clock drawing and visuospatial tasks which involve writing - patient unable 2/2 R hemiparesis). Based on modified score, patient presents with significant cognitive-linguistic impairment characterized by decreased calculations, confrontation naming, short-term recall, sustained attention, and mental manipulation. Patient benefited from  additional processing time and semantic cues for cognitive and linguistic tasks.  MD reported concern  for possible coughing with PO intake. Patient was previously consuming a regular diet and thin liquids. BSE completed and revealed a mild oral dysphagia characterized by prolonged mastication impacting bolus breakdown, and mild oral residue post swallows with regular consistencies. Patient exhibited more effective mastication and overall oral prep with Dys 3 consistencies. No pharyngeal symptoms were observed among all trials including no overt s/sx of aspiration and clear vocal quality post swallows. Patient recommended for Dys 3 diet and thin liquids at this time. Patient reports tolerance of whole pills with thin liquid. Recommend full supervision due to noted impulsivity with PO intake including fast rate of consumption, large bite sizes, and multiple bites at a time with reduced self monitoring. Feeding rate improved with sup A verbal cues with good carry over during session. Patient was educated and in agreement with diet change and recommendations for full supervision.   Patient would benefit from skilled SLP intervention to maximize cognitive-linguistic, speech, and swallow function in order to maximize functional independence and safety prior to discharge. Anticipate patient will require 24/7 supervision at home and may benefit from f/u SLP services pending progress and further discussion with spouse on premorbid cognitive status.    Skilled Therapeutic Interventions          Administered both formal and informal cognitive-linguistic evaluation using the [place] (SLUMS), clinical bedside swallow evaluation (BSE), and educated patient on results of assessments, speech intelligibility strategies, diet recommendations, and safe swallow precautions and strategies. Patient verbalized agreement to plan of care.    SLP Assessment  Patient will need skilled Speech Lanaguage  Pathology Services during CIR admission    Recommendations  SLP Diet Recommendations: Dysphagia 3 (Mech soft);Thin Liquid Administration via: Cup;Straw Medication Administration: Whole meds with liquid Supervision: Patient able to self feed;Full supervision/cueing for compensatory strategies Compensations: Minimize environmental distractions;Slow rate;Small sips/bites;Lingual sweep for clearance of pocketing Postural Changes and/or Swallow Maneuvers: Seated upright 90 degrees Oral Care Recommendations: Oral care BID Patient destination: Home Follow up Recommendations: Other (comment) (TBD) Equipment Recommended: None recommended by SLP    SLP Frequency 3 to 5 out of 7 days   SLP Duration  SLP Intensity  SLP Treatment/Interventions 21-23  Minumum of 1-2 x/day, 30 to 90 minutes  Cognitive remediation/compensation;Environmental controls;Internal/external aids;Speech/Language facilitation;Cueing hierarchy;Dysphagia/aspiration precaution training;Functional tasks;Patient/family education;Therapeutic Activities    Pain Pain Assessment Pain Scale: 0-10 Pain Score: 0-No pain  Prior Functioning Cognitive/Linguistic Baseline: Information not available Type of Home: House  Lives With: Spouse;Daughter Available Help at Discharge: Family;Available 24 hours/day Vocation: On disability  SLP Evaluation Cognition Overall Cognitive Status: Impaired/Different from baseline Arousal/Alertness: Awake/alert Orientation Level: Oriented X4 Focused Attention: Appears intact Sustained Attention: Impaired Sustained Attention Impairment: Verbal basic;Functional basic Memory: Impaired Memory Impairment: Decreased short term memory Decreased Short Term Memory: Functional basic;Verbal basic Awareness: Impaired Awareness Impairment: Emergent impairment Problem Solving: Impaired Problem Solving Impairment: Functional basic;Functional complex Behaviors: Impulsive (impulsive with PO intake including  large bite sized and fast rate of consumption with minimal self monitoring) Safety/Judgment: Impaired  Comprehension Auditory Comprehension Overall Auditory Comprehension: Appears within functional limits for tasks assessed Yes/No Questions: Within Functional Limits Commands: Within Functional Limits Expression Expression Primary Mode of Expression: Verbal Verbal Expression Overall Verbal Expression: Impaired Initiation: No impairment Level of Generative/Spontaneous Verbalization: Conversation Repetition: No impairment Naming: Impairment Confrontation: Impaired Convergent: 50-74% accurate Divergent: Not tested Interfering Components: Speech intelligibility Effective Techniques: Semantic cues Non-Verbal Means of Communication: Not applicable Written Expression Dominant Hand: Right Oral Motor Oral Motor/Sensory Function Overall  Oral Motor/Sensory Function: Moderate impairment Facial ROM: Reduced right;Suspected CN VII (facial) dysfunction Facial Symmetry: Abnormal symmetry right Facial Strength: Reduced right Facial Sensation: Within Functional Limits Lingual ROM: Within Functional Limits Lingual Symmetry: Abnormal symmetry right;Suspected CN XII (hypoglossal) dysfunction Lingual Strength: Reduced Lingual Sensation: Within Functional Limits Mandible: Within Functional Limits Motor Speech Overall Motor Speech: Impaired Respiration: Within functional limits Phonation: Normal Resonance: Within functional limits Articulation: Impaired Level of Impairment: Word Intelligibility: Intelligibility reduced Word: 50-74% accurate Phrase: 50-74% accurate Sentence: 25-49% accurate Conversation: 25-49% accurate Motor Planning: Witnin functional limits Motor Speech Errors: Unaware Effective Techniques: Over-articulate;Increased vocal intensity;Slow rate  Care Tool Care Tool Cognition Expression of Ideas and Wants Expression of Ideas and Wants: Frequent difficulty - frequently  exhibits difficulty with expressing needs and ideas   Understanding Verbal and Non-Verbal Content Understanding Verbal and Non-Verbal Content: Usually understands - understands most conversations, but misses some part/intent of message. Requires cues at times to understand   Memory/Recall Ability *first 3 days only Memory/Recall Ability *first 3 days only: Current season;That he or she is in a hospital/hospital unit    Intelligibility: Intelligibility reduced Word: 50-74% accurate Phrase: 50-74% accurate Sentence: 25-49% accurate Conversation: 25-49% accurate  Bedside Swallowing Assessment General Date of Onset: 09/15/20 Previous Swallow Assessment: unknown Diet Prior to this Study: Regular;Thin liquids Behavior/Cognition: Alert;Cooperative Oral Cavity - Dentition: Adequate natural dentition Self-Feeding Abilities: Needs set up Patient Positioning: Upright in bed Baseline Vocal Quality: Normal Volitional Cough: Strong Volitional Swallow: Able to elicit  Oral Care Assessment Does patient have any of the following "high(er) risk" factors?: None of the above Does patient have any of the following "at risk" factors?: None of the above Ice Chips Ice chips: Not tested Thin Liquid Thin Liquid: Within functional limits Presentation: Straw;Cup Nectar Thick Nectar Thick Liquid: Not tested Honey Thick Honey Thick Liquid: Not tested Puree Puree: Within functional limits Solid Solid: Impaired Presentation: Self Fed Oral Phase Impairments: Impaired mastication;Reduced lingual movement/coordination Oral Phase Functional Implications: Impaired mastication;Oral residue BSE Assessment Risk for Aspiration Impact on safety and function: Mild aspiration risk Other Related Risk Factors: Previous CVA;Cognitive impairment  Short Term Goals: Week 1: SLP Short Term Goal 1 (Week 1): Patient will demonstrate effective mastication and oral clearance with regular consistency PO trials prior to  diet advancement. SLP Short Term Goal 2 (Week 1): Patient will demonstrate improved self monitoring and correction with use of speech intelligibility strategies at the sentence level with minA verbal cues. SLP Short Term Goal 3 (Week 1): Patient will demonstrate functional problem solving and/or reasoning for mildly complex tasks with mod A verbal/visual cues. SLP Short Term Goal 4 (Week 1): Patient will demonstrate recent recall (10 minutes or greater) of novel information with mod A verbal cues for use of compensatory strategies  Refer to Care Plan for Long Term Goals  Recommendations for other services: None   Discharge Criteria: Patient will be discharged from SLP if patient refuses treatment 3 consecutive times without medical reason, if treatment goals not met, if there is a change in medical status, if patient makes no progress towards goals or if patient is discharged from hospital.  The above assessment, treatment plan, treatment alternatives and goals were discussed and mutually agreed upon: by patient  Patty Sermons 09/20/2020, 5:09 PM

## 2020-09-21 ENCOUNTER — Encounter (HOSPITAL_COMMUNITY): Payer: Self-pay | Admitting: Internal Medicine

## 2020-09-21 LAB — GLUCOSE, CAPILLARY
Glucose-Capillary: 95 mg/dL (ref 70–99)
Glucose-Capillary: 95 mg/dL (ref 70–99)
Glucose-Capillary: 126 mg/dL — ABNORMAL HIGH (ref 70–99)
Glucose-Capillary: 119 mg/dL — ABNORMAL HIGH (ref 70–99)

## 2020-09-21 NOTE — Progress Notes (Signed)
Physical Therapy Session Note  Patient Details  Name: Craig Knight MRN: [id number] Date of Birth: 09-Aug-1965  Today's Date: 09/21/2020 PT Individual Time: 0910-1007 and 1435-1500 PT Individual Time Calculation (min): 57 min and 25 min  Short Term Goals: Week 1:  PT Short Term Goal 1 (Week 1): Pt will perform supine<>sit with mod assist PT Short Term Goal 2 (Week 1): Pt will perform sit<>stands with mod assist PT Short Term Goal 3 (Week 1): Pt will perform bed<>chair transfers with mod assist PT Short Term Goal 4 (Week 1): Pt will ambulate at least 82f using LRAD with max assist of 1 and +2 for safety  Skilled Therapeutic Interventions/Progress Updates:    Session 1: Pt received supine, asleep in bed but upon awakening agreeable to therapy session though remains drowsy. Therapist provided pt with different w/c to promote improved pelvic and spine alignment (will continue to assess for improved w/c fit as others become available). Supine>sitting L EOB, HOB flat but using bedrail, via logroll technique to promote increased independence with max assist for R hemibody management and trunk upright. Found pt's Zio patch heart monitor in the bed - notified RN and present to re-apply on pt's chest. R squat pivot EOB>w/c with max assist for lifting/pivoting hips and cuing for head/hips relationship, increased anterior trunk lean, and increased hip clearance. Sitting in w/c at sink, performed oral care and washed face with min assist - therapist providing hand-over-hand assistance to incorporate R UE into bimanual tasks. Pt suddenly states he is feeling like "hog shit" and stating his "mouth feels twisted" and "dry" vitals assessed: BP 138/103 (MAP 113), HR 73bpm, SpO2 98%  with RN notified of pt's symptoms. Neuro screen performed with pt demoing no change in strength, sensation, or speech. Provided water for dry mouth.  Sit>stand from w/c with max assist of 1 for balance and blocking R knee buckle. Standing  vitals: BP 149/97 (MAP 114), HR 90bpm, SpO2 98% Pt agreeable to remain sitting in w/c - left with needs in reach, R UE supported on 1/2 lap tray, and seat belt alarm on. RN aware of pt's position and planning to continue monitoring for symptoms.   Session 2: Pt received supine in bed with his wife present and pt agreeable to therapy session. Supine bridging with lateral scoot in the bed targeting R LE NMR with facilitation to place R LE into hooklying and prevent R knee abduction while lifting hips. Supine>sitting L EOB, HOB flat and not using bedrail, via logroll technique to increase pt independence with max assist for R hemibody management and trunk upright. Sit<>stands to/from EOB with max assist of 1 to block R knee and facilitate hip extension while maintaining balance in standing - pt noted to have some quad activation when initiating lifting hips to come to stand. Standing balance and R LE NMR via LUE reaching to external target working on weight shifting. R LE NMR via lateral scooting along EOB with mod assist from therapist and facilitation/cuing for increased hip clearance to promote increased R LE WBing (continues to have quad activation with this task). Standing mini-squats, no UE support, with max assist of 1 focusing on R LE quad/glute activation with therapist facilitating increased extension. Pt agreeable to sit in recliner to promote increased, upright OOB activity tolerance. R squat pivot EOB>drop arm recliner with heavy mod assist of 1 for lifting/pivoting hips - cuing for anterior trunk lean and manual facilitation for R LE positioning during transfer to promote WBing.  Therapy Documentation Precautions:  Precautions Precautions: Fall, Other (comment) Precaution Comments: L hemianopsia; dense R hemiparesis; Zio Patch Restrictions Weight Bearing Restrictions: No   Pain:  Session 1: Denies pain during session.  Session 2: No reports of pain throughout session.   Therapy/Group:  Individual Therapy  Tawana Scale , PT, DPT, NCS, CSRS 09/21/2020, 7:53 AM

## 2020-09-21 NOTE — Progress Notes (Signed)
Orthopedic Tech Progress Note Patient Details:  Craig Knight 23-Nov-1965 [id number] Called order into [facility]  Patient ID: Craig Knight, male   DOB: Dec 27, 1965, 55 y.o.   MRN: [id number]  Chip Boer 09/21/2020, 10:47 AM

## 2020-09-21 NOTE — Progress Notes (Signed)
Patient ID: Craig Knight, male   DOB: 03/13/1965, 55 y.o.   MRN: [id number] Met with the patient to introduce self and the role of the nurse CM. Patient noted he was short of breath and throat felt like it was closing in on him. Did not appear in distress however rocking left leg and noted he had told others of his situation but nothing had changed since he was in therapy session earlier. Patient able to carry on conversation and declined repositioning assistance. Nurse/tech notified and vitals requested. Dan in to see patient and conversation continued. Briefly discussed frustration with hospitalization and pleased with new order to go outside after therapy. Reviewed secondary stroke risks and dietary modifications. Patient noted an understanding of the information reviewed and left with handouts to review when he is feeling better. Continue to follow along to discharge to address educational needs and facilitate preparation for discharge. Margarito Liner

## 2020-09-21 NOTE — IPOC Note (Signed)
Overall Plan of Care [facility]) Patient Details Name: Craig Knight MRN: [id number] DOB: [birth year]  Admitting Diagnosis: Infarction of left basal ganglia [facility])  Hospital Problems: Principal Problem:   Infarction of left basal ganglia (HCC) Active Problems:   DM type 2 (diabetes mellitus, type 2) ([place])   CKD (chronic kidney disease), stage III ([place])     Functional Problem List: Nursing Bowel, Medication Management, Endurance, Safety, Pain  PT Balance, Safety, Behavior, Sensory, Edema, Skin Integrity, Endurance, Motor, Nutrition, Pain, Perception  OT Balance, Safety, Cognition, Endurance, Pain, Vision  SLP Cognition, Linguistic  TR         Basic ADL's: OT Eating, Grooming, Bathing, Dressing, Toileting     Advanced  ADL's: OT       Transfers: PT Bed Mobility, Bed to Chair, Car, Chief Operating Officer: PT Ambulation, Emergency planning/management officer, Stairs     Additional Impairments: OT Fuctional Use of Upper Extremity  SLP Swallowing, Communication expression    TR      Anticipated Outcomes Item Anticipated Outcome  Self Feeding modified independent  Swallowing  Mod I   Basic self-care  min assist  Toileting  min assist   Bathroom Transfers min assist  Bowel/Bladder  manage bowel with mod I assist  Transfers  CGA using LRAD  Locomotion  mod assist using LRAD  Communication  Sup A  Cognition  min A  Pain  at or below level 4  Safety/Judgment  maintain safety with cues/reminders   Therapy Plan: PT Intensity: Minimum of 1-2 x/day ,45 to 90 minutes PT Frequency: 5 out of 7 days PT Duration Estimated Length of Stay: 3.5-4 weeks OT Intensity: Minimum of 1-2 x/day, 45 to 90 minutes OT Frequency: 5 out of 7 days OT Duration/Estimated Length of Stay: 21-23 days SLP Intensity: Minumum of 1-2 x/day, 30 to 90 minutes SLP Frequency: 3 to 5 out of 7 days SLP Duration/Estimated Length of Stay: 21-23   Due to the current state of emergency, patients may  not be receiving their 3-hours of Medicare-mandated therapy.   Team Interventions: Nursing Interventions Patient/Family Education, Bowel Management, Pain Management, Discharge Planning, Medication Management, Disease Management/Prevention  PT interventions Ambulation/gait training, Community reintegration, DME/adaptive equipment instruction, Neuromuscular re-education, Psychosocial support, Stair training, UE/LE Strength taining/ROM, Wheelchair propulsion/positioning, Training and development officer, Discharge planning, Functional electrical stimulation, Pain management, Skin care/wound management, Therapeutic Activities, UE/LE Coordination activities, Cognitive remediation/compensation, Disease management/prevention, Functional mobility training, Patient/family education, Splinting/orthotics, Therapeutic Exercise, Visual/perceptual remediation/compensation  OT Interventions Balance/vestibular training, Cognitive remediation/compensation, Neuromuscular re-education, Splinting/orthotics, Patient/family education, Functional mobility training, Discharge planning, Functional electrical stimulation, Pain management, Self Care/advanced ADL retraining, Therapeutic Activities, UE/LE Coordination activities, Therapeutic Exercise, Visual/perceptual remediation/compensation, UE/LE Strength taining/ROM, Wheelchair propulsion/positioning, DME/adaptive equipment instruction, Community reintegration  SLP Interventions Cognitive remediation/compensation, Environmental controls, Internal/external aids, Speech/Language facilitation, English as a second language teacher, Dysphagia/aspiration precaution training, Functional tasks, Patient/family education, Therapeutic Activities  TR Interventions    SW/CM Interventions Psychosocial Support, Discharge Planning, Patient/Family Education, Disease Management/Prevention   Barriers to Discharge MD  Medical stability and Medication compliance  Nursing Decreased caregiver support, Home environment  access/layout home w spouse 1 level 2 ste  PT Inaccessible home environment, Home environment access/layout    OT      SLP      SW IV antibiotics     Team Discharge Planning: Destination: PT-Home ,OT- Home , SLP-Home Projected Follow-up: PT-Home health PT, 24 hour supervision/assistance, OT-  24 hour supervision/assistance, Outpatient OT, SLP-Other (comment) (TBD) Projected Equipment Needs: PT-To be determined,  OT- To be determined, SLP-None recommended by SLP Equipment Details: PT- , OT-  Patient/family involved in discharge planning: PT- Patient, Family member/caregiver,  OT-Patient, SLP-Patient  MD ELOS: 14-21d Medical Rehab Prognosis:  Good Assessment: 55 year old right-handed male with history of hypertension, obesity with BMI 35.25, COPD/tobacco abuse, diabetes mellitus, diastolic congestive heart failure CKD stage III with latest creatinine 2.40 as well as recent hospitalization 09/06/2020- 09/08/2020 for 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  Maintained on aspirin 81 mg daily and Brilinta 90 mg twice daily plan for 30 days then aspirin 81 mg daily and Plavix 75 mg daily.  He was discharged home ambulating minimal guard 150 feet without assistive device.  Per chart review patient lives with spouse and daughter.  1 level home with 2 steps to entry.  Presented 09/15/2020 to [facility] with nonspecific shortness of breath and worsening right side weakness.  MRI of the brain showed acute left basal ganglia infarction.  Evolving subacute posterior circulation infarct without extension from 09/07/2020.  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  MRA of the head showed diffuse intracranial atherosclerotic disease.  Occlusion right PCA.  Severe stenosis left PCA with distal flow.  This appeared to possibly have progressed since prior studies however difficult to compare given the amount of  motion on the MRA.  CTA head neck no emergent large vessel occlusion.  There was an incidental finding of thyroid nodule 1.5 cm recommendations outpatient follow-up ultrasound.  Echocardiogram with ejection fraction of 60 to 65% no wall motion abnormalities.  TEE completed showing no PFO.  Admission chemistries unremarkable except CO2 of 20 BUN 33 creatinine 2.01, WBC 12,800, troponin 33-50, hemoglobin A1c 7.4.  Currently maintained on aspirin 81 mg daily as well as Brilinta 90 mg twice daily for CVA prophylaxis for 30 days then back to aspirin and Plavix DAPT.  Subcutaneous Lovenox for DVT prophylaxis.  Refused loop recorder placement and ZIO patch added..  Tolerating a regular consistency diet.  Therapy evaluations completed due to patient's increasing right side weakness was admitted for a comprehensive rehab program.      See Team Conference Notes for weekly updates to the plan of care

## 2020-09-21 NOTE — Progress Notes (Signed)
Occupational Therapy Session Note  Patient Details  Name: Craig Knight MRN: [id number] Date of Birth: 11-28-1965  Today's Date: 09/21/2020 OT Individual Time: [id number] OT Individual Time Calculation (min): 52 min    Short Term Goals: Week 1:  OT Short Term Goal 1 (Week 1): Pt wll complete UB bathing at min assist sitting unsupported. OT Short Term Goal 2 (Week 1): Pt will complete UB dressing with min assist to donn a pullover shirt. OT Short Term Goal 3 (Week 1): Pt will complete LB bathing with mod assist sit to stand for two consecutive sessions. OT Short Term Goal 4 (Week 1): Pt will complete toilet transfers with mod assist squat pivot to the drop arm commode.  Skilled Therapeutic Interventions/Progress Updates:    Pt in wheelchair to start session with nursing present.  He was rolled down to the therapy gym via wheelchair with max assist squat pivot transfer to the therapy mat.  Worked on educating on self PROM exercises for the RUE in sitting to start with completion of shoulder flexion, elbow flexion, wrist flexion/extension and digit flexion/extension.  He was able to return demonstrate with overall mod demonstrational cueing and occasional min assist for technique.  Progressed to light weightbearing through the RUE in sitting with functional reaching to target with the LUE while having to support himself with the LUE.  Max facilitation needed to help with right elbow extension while reaching with the left.  He then worked on active movement in the [place] with use of a therapy ball and having pt place his hand on top of the ball supported on a step in his lap, and roll it inward with emphasis on internal rotation.  Trace movements noted in internal rotation.  Finished session with transfer back to the wheelchair with max assist and then to the bed at the same level to rest.  Call button and phone in reach with safety alarm in place.    Therapy Documentation Precautions:   Precautions Precautions: Fall, Other (comment) Precaution Comments: L hemianopsia; dense R hemiparesis; Zio Patch Restrictions Weight Bearing Restrictions: No  Pain: Pain Assessment Pain Scale: 0-10 Pain Score: 0-No pain ADL: See Care Tool Section for some details of mobility and selfcare  Therapy/Group: Individual Therapy  [place] OTR/L 09/21/2020, 4:15 PM

## 2020-09-21 NOTE — Progress Notes (Signed)
Speech Language Pathology Daily Session Note  Patient Details  Name: Craig Knight MRN: [id number] Date of Birth: 01-16-1966  Today's Date: 09/21/2020 SLP Individual Time: 1345-1430 SLP Individual Time Calculation (min): 45 min  Short Term Goals: Week 1: SLP Short Term Goal 1 (Week 1): Patient will demonstrate effective mastication and oral clearance with regular consistency PO trials prior to diet advancement. SLP Short Term Goal 2 (Week 1): Patient will demonstrate improved self monitoring and correction with use of speech intelligibility strategies at the sentence level with minA verbal cues. SLP Short Term Goal 3 (Week 1): Patient will demonstrate functional problem solving and/or reasoning for mildly complex tasks with mod A verbal/visual cues. SLP Short Term Goal 4 (Week 1): Patient will demonstrate recent recall (10 minutes or greater) of novel information with mod A verbal cues for use of compensatory strategies  Skilled Therapeutic Interventions: Patient agreeable to skilled ST intervention with focus on speech goals. Upon arrival, patient complained of difficulty breathing. SPO2 was 98% and patient exhibited no visible shortness of breath. NT and RN made aware. SLP facilitated repositioning in bed and diaphragmatic breathing in which patient was able to execute with min A verbal cues. Patient expressed no further complaints of SOB. Facilitated speech intelligibility strategies at sentence level with min-to-mod A verbal cues to 1. Increase vocal loudness, 2. Reduce speech rate, 3. Increase breath support. Patient was perceived as 50% intelligible at sentence level without cues, and 75-90% with cueing. Patient with decreased self monitoring and minimal awareness of speech errors. Spouse arrived toward end of session and SLP educated on results of SLP assessment completed yesterday and speech intelligibility strategies. Spouse endorsed mild short-term difficulties at baseline. She expressed  patient appears near baseline function from a cognitive standpoint, and acknowledged dysarthria as significant change. Patient was left in bed with alarm activated and immediate needs within reach at end of session. Spouse at bedside. Continue per current plan of care.     Pain Pain Assessment Pain Scale: 0-10 Pain Score: 0-No pain  Therapy/Group: Individual Therapy  Patty Sermons 09/21/2020, 2:32 PM

## 2020-09-22 ENCOUNTER — Inpatient Hospital Stay (HOSPITAL_COMMUNITY): Payer: Medicare Other

## 2020-09-22 DIAGNOSIS — I639 Cerebral infarction, unspecified: Secondary | ICD-10-CM | POA: Diagnosis not present

## 2020-09-22 LAB — GLUCOSE, CAPILLARY
Glucose-Capillary: 121 mg/dL — ABNORMAL HIGH (ref 70–99)
Glucose-Capillary: 129 mg/dL — ABNORMAL HIGH (ref 70–99)
Glucose-Capillary: 128 mg/dL — ABNORMAL HIGH (ref 70–99)
Glucose-Capillary: 123 mg/dL — ABNORMAL HIGH (ref 70–99)

## 2020-09-22 IMAGING — DX DG CHEST 1V PORT
1 series · 1 of 1 positions shown · non-contrast
Comparison: Single view of the chest [DATE].

CLINICAL DATA: Shortness of breath.

EXAM:
PORTABLE CHEST 1 VIEW

[chest]
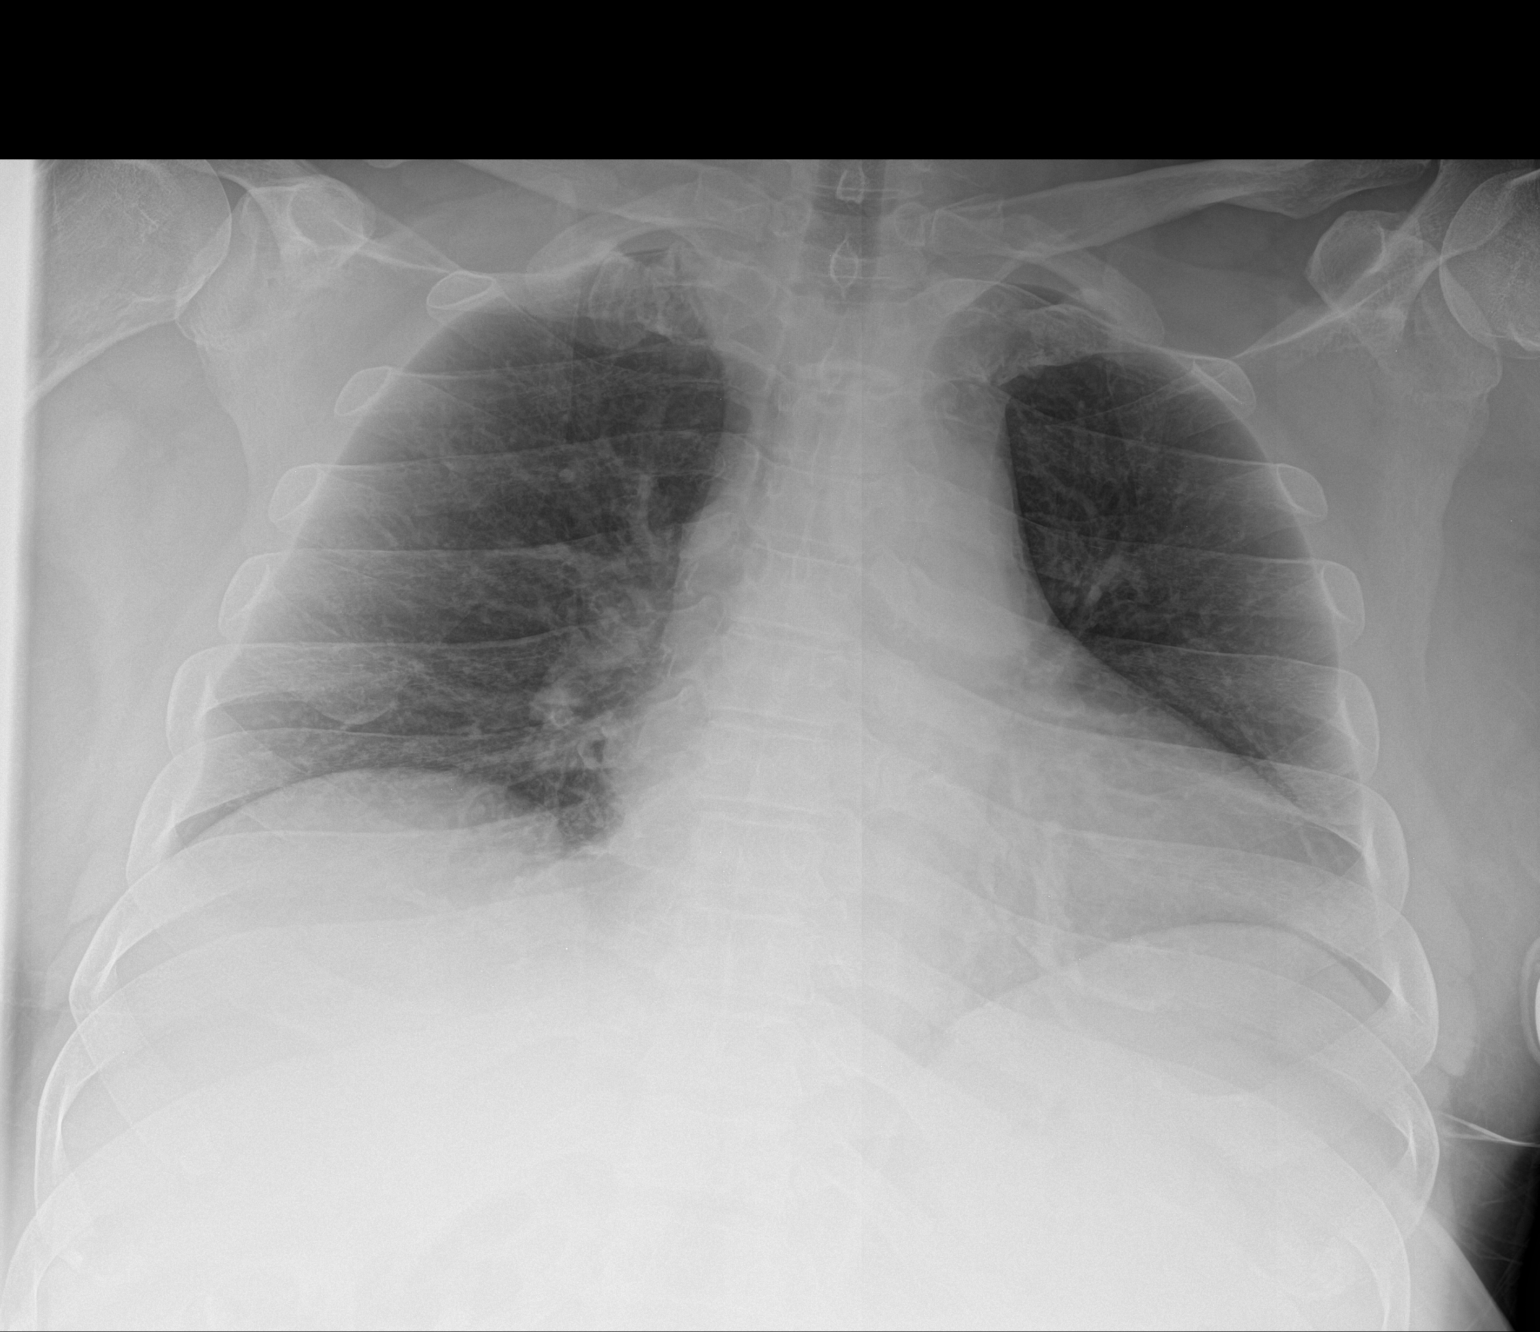

[1 of 1 positions shown; findings below may reference images not displayed]

FINDINGS: The heart size and mediastinal contours are within normal limits.
The lungs are clear. The visualized skeletal structures are
unremarkable.
IMPRESSION: Negative chest.

## 2020-09-22 MED ORDER — ALPRAZOLAM 0.25 MG PO TABS
0.1250 mg | ORAL_TABLET | Freq: Once | ORAL | Status: AC
  Administered 2020-09-22: 0.125 mg via ORAL
  Filled 2020-09-22: qty 1

## 2020-09-22 NOTE — Progress Notes (Signed)
Occupational Therapy Session Note  Patient Details  Name: Craig Knight MRN: [id number] Date of Birth: 22-Jan-1966  Today's Date: 09/22/2020 OT Individual Time: 1000-1102 OT Individual Time Calculation (min): 62 min    Short Term Goals: Week 1:  OT Short Term Goal 1 (Week 1): Pt wll complete UB bathing at min assist sitting unsupported. OT Short Term Goal 2 (Week 1): Pt will complete UB dressing with min assist to donn a pullover shirt. OT Short Term Goal 3 (Week 1): Pt will complete LB bathing with mod assist sit to stand for two consecutive sessions. OT Short Term Goal 4 (Week 1): Pt will complete toilet transfers with mod assist squat pivot to the drop arm commode.  Skilled Therapeutic Interventions/Progress Updates:    Pt agreeable to shower and dressing.  Noted pt not wearing his external loop recorder device.  When asked, he stated it came off last night but he hasn't told anyone.  Nursing made aware and pt completed supine to sit with max assist following therapist instructions for rolling.  He then completed sit to stand on the [place] with mod assist in order to complete transfer to the shower.  Mod assist for removal of LB clothing before sitting down on the seat.  He was able to complete bathing with max assist sit to stand.  Max hand over hand assist needed for using the RUE to wash the left arm.  Decreased ability to reach his right foot with slight increased LOB/lean to the right noted when trying to reach down and wash his lower legs and feet.  Increased fatigue after standing to wash peri area with max assist with pt reporting that he has asthma and COPD.  When asked if he needed medicine, he stated that he told the nurse last night.  Therapist instructed him that if he needed medicine he has to call for it as he needs it, especially if he is having trouble catching his breath.  He voiced understanding, but did not request any medicine.  Dyspnea only 2/4 with activity, so did not note  severe SOB during session as pt reports.  Transferred out to the wheelchair with total assist using the [facility] for grooming and dressing tasks.  He was able to complete oral hygiene in sitting with supervision and max assist to incorporate the RUE as a stabilizer.  He then progressed to dressing with mod assist for UB following hemi techniques and max assist for LB as well.  Decreased ability to cross the RLE over the left knee with therapist assist secondary to decreased flexibility.  Finished session with therapist assist to donn gripper socks at dependent level.  He reported need to toilet, so NT notified to assist.    Therapy Documentation Precautions:  Precautions Precautions: Fall, Other (comment) Precaution Comments: L hemianopsia; dense R hemiparesis; Zio Patch Restrictions Weight Bearing Restrictions: No   Pain: Pain Assessment Pain Scale: Faces Faces Pain Scale: Hurts a little bit Pain Type: Acute pain Pain Location: Leg Pain Orientation: Right Pain Descriptors / Indicators: Discomfort Pain Onset: With Activity Pain Intervention(s): Repositioned;Emotional support ADL: See Care Tool Section for some details of mobility and selfcare   Therapy/Group: Individual Therapy  Trellis [email or web address].xx 09/22/2020, 12:19 PM

## 2020-09-22 NOTE — Progress Notes (Signed)
Pt complaining of not being able to breathe, prn neb treatment administered, pt still complain of shob and not being able to breathe, I called [facility] and they suggested O2 @ 2L, O2 sats @ 98%, spoke with Linna Hoff and he placed orders for 2L of O2 if O2 under 90%  Milford Cage LPN

## 2020-09-22 NOTE — Progress Notes (Signed)
Physical Therapy Session Note  Patient Details  Name: Craig Knight MRN: [id number] Date of Birth: 1965/06/15  Today's Date: 09/22/2020 PT Individual Time:  [id number]  PT Individual Time Calculation (min): 70 min    Short Term Goals: Week 1:  PT Short Term Goal 1 (Week 1): Pt will perform supine<>sit with mod assist PT Short Term Goal 2 (Week 1): Pt will perform sit<>stands with mod assist PT Short Term Goal 3 (Week 1): Pt will perform bed<>chair transfers with mod assist PT Short Term Goal 4 (Week 1): Pt will ambulate at least 4f using LRAD with max assist of 1 and +2 for safety  Skilled Therapeutic Interventions/Progress Updates:  Patient seated EOB on entrance to room. Patient alert and agreeable to PT session although stating that he wasn't feeling great - "because of the stroke". Patient with no pain complaint throughout session.  Therapeutic Activity: Bed Mobility: Patient performed supine <> sit with Min A for RLE. VC/ tc required for assisting LLE with rotation of trunk/ hip. Transfers: Patient performed SPVT transfer to R bed--> w/c with Mod A for R foot placement and R knee block. STS in parallel bars with pull-to-stand technique required Min to Mod A dependent on fatigue and L hand positioning. STS from w/c to [facility]at end of session with Min A and heavy lean to L. VC/tc throughout for technique including forward lean, weight shift.  Neuromuscular Re-ed: NMR facilitated during session with focus on muscle activation, facilitation, motor sequence, joint approximation, standing balance, awareness of midline. Pt guided in standing in parallel bars. Guided in postural alignment requiring rotation/ anterior positioning of R hip forward into extension. Pressure into R hand on parallel bar. Mirror may help for posture and positioning in next session. Each stance progressed into lateral weight shift with block to R knee and then to minisquats with minimal muscle activation noted to initiate  return to extension but very brief.  NMR performed for improvements in motor control and coordination, balance, sequencing, judgement, and self confidence/ efficacy in performing all aspects of mobility at highest level of independence.   Gait Training:  Patient ambulated 10 feet including pivot turn using HW with MaxA from w/c to EOB. Demonstrated heavy lean to L over HW and required Max A for RLE advancement, positioning, knee block as well as weight shifting of hips with vc for reminder of activity in parallel bars. Provided vc/ tc for technique throughout based on assist provided above.  Once assisted to supine in bed at end of session, pt states that he cannot breathe despite no s/s indicating he is having difficutly breathing. Nsg notified that pt is requesting inhaler and then provided to pt. Pt brings self to seated position on EOB with Min A for RLE. Pillows provided for back rest with [facility]raised for additional support.   Patient seated EOB at end of session with brakes locked, bed alarm set, wife in room, NT notified as to position on bed and all needs within reach.      Therapy Documentation Precautions:  Precautions Precautions: Fall, Other (comment) Precaution Comments: L hemianopsia; dense R hemiparesis; Zio Patch Restrictions Weight Bearing Restrictions: No  Therapy/Group: Individual Therapy  JAlger SimonsPT, DPT 09/22/2020, 7:52 AM

## 2020-09-22 NOTE — Progress Notes (Signed)
PROGRESS NOTE   Subjective/Complaints:  Speech assessed no swallowing issues, focusing on cognitive Right knee staying flexed , no pain RLE  ROS- neg CP, - bowel or bladder issues  Objective:   No results found. Recent Labs    09/20/20 0519  WBC 10.3  HGB 14.8  HCT 43.2  PLT 211    Recent Labs    09/20/20 0519  NA 136  K 3.9  CL 104  CO2 24  GLUCOSE 98  BUN 23*  CREATININE 1.57*  CALCIUM 9.2     Intake/Output Summary (Last 24 hours) at 09/22/2020 0733 Last data filed at 09/22/2020 0640 Gross per 24 hour  Intake 480 ml  Output 1000 ml  Net -520 ml         Physical Exam: Vital Signs Blood pressure 120/90, pulse 62, temperature 98 F (36.7 C), temperature source Oral, resp. rate 18, height '5\' 7"'$  (1.702 m), weight 103.2 kg, SpO2 98 %.   General: No acute distress Mood and affect are appropriate Heart: Regular rate and rhythm no rubs murmurs or extra sounds Lungs: Clear to auscultation, breathing unlabored, no rales or wheezes Abdomen: Positive bowel sounds, soft nontender to palpation, nondistended Extremities: No clubbing, cyanosis, or edema Skin: No evidence of breakdown, no evidence of rash Neurologic: Cranial nerves II through XII intact, motor strength is 5/5 in Left and 0/5 right deltoid, bicep, tricep, grip, hip flexor, knee extensors, ankle dorsiflexor and plantar flexor Sensory exam normal sensation to light touch and proprioception in bilateral upper and lower extremities Cerebellar exam normal finger to nose to finger as well as heel to shin in bilateral upper and lower extremities Musculoskeletal: Full range of motion in all 4 extremities. No joint swelling   Assessment/Plan: 1. Functional deficits which require 3+ hours per day of interdisciplinary therapy in a comprehensive inpatient rehab setting. Physiatrist is providing close team supervision and 24 hour management of active medical  problems listed below. Physiatrist and rehab team continue to assess barriers to discharge/monitor patient progress toward functional and medical goals  Care Tool:  Bathing    Body parts bathed by patient: Chest, Abdomen, Right arm, Right upper leg, Left upper leg, Face   Body parts bathed by helper: Left arm, Right lower leg, Left lower leg, Front perineal area, Buttocks     Bathing assist Assist Level: Maximal Assistance - Patient 24 - 49%     Upper Body Dressing/Undressing Upper body dressing   What is the patient wearing?: Pull over shirt    Upper body assist Assist Level: Maximal Assistance - Patient 25 - 49%    Lower Body Dressing/Undressing Lower body dressing      What is the patient wearing?: Underwear/pull up     Lower body assist Assist for lower body dressing: Maximal Assistance - Patient 25 - 49%     Toileting Toileting    Toileting assist Assist for toileting: 2 Helpers     Transfers Chair/bed transfer  Transfers assist     Chair/bed transfer assist level: Maximal Assistance - Patient 25 - 49%     Locomotion Ambulation   Ambulation assist   Ambulation activity did not occur: Safety/medical concerns  Walk 10 feet activity   Assist  Walk 10 feet activity did not occur: Safety/medical concerns        Walk 50 feet activity   Assist Walk 50 feet with 2 turns activity did not occur: Safety/medical concerns         Walk 150 feet activity   Assist Walk 150 feet activity did not occur: Safety/medical concerns         Walk 10 feet on uneven surface  activity   Assist Walk 10 feet on uneven surfaces activity did not occur: Safety/medical concerns         Wheelchair     Assist Will patient use wheelchair at discharge?:  (TBD)             Wheelchair 50 feet with 2 turns activity    Assist            Wheelchair 150 feet activity     Assist          Blood pressure 120/90, pulse 62,  temperature 98 F (36.7 C), temperature source Oral, resp. rate 18, height '5\' 7"'$  (1.702 m), weight 103.2 kg, SpO2 98 %.    A/P  1.  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 Refused loop recorder and ZIO patch added. No UE or LE movement so far on RIght side              -patient may  shower              -ELOS/Goals: 14-21 days - min A to supervision Rehab evals today   2.  Antithrombotics:  -DVT/anticoagulation: Lovenox              -antiplatelet therapy: Aspirin 81 mg daily and Brilinta 90 mg twice daily for 30 days then back to aspirin and Plavix DAPT  3. Pain Management: Neurontin 300 mg nightly   4. Mood: Trazodone 100 mg nightly              -antipsychotic agents: N/A  5. Neuropsych: This patient is capable of making decisions on his own behalf.  6. Skin/Wound Care: Routine skin checks  7. Fluids/Electrolytes/Nutrition: Routine in and outs with follow-up chemistries  8.  Diabetes mellitus and peripheral neuropathy.  Hemoglobin A1c 7.4.  SSI.  Patient on Glucotrol 5 mg daily, Glucophage 500 mg twice daily prior to admission.  Resume as needed CBG (last 3)  Recent Labs    09/21/20 1654 09/21/20 2109 09/22/20 0629  GLUCAP 126* 119* 121*    Controlled 7/29  8.  Permissive hypertension.  Currently on Hygroton 25 mg daily.  Patient also had been on hydralazine 50 mg twice daily, Imdur 60 mg daily Cozaar 100 mg daily prior to admission.  Resume as needed Controlled  9.  CKD stage III.  Follow-up chemistries  10.  Hyperlipidemia.  Lipitor  11.  COPD/tobacco abuse.  Counseling.  Continue inhalers as directed.  12.  Diastolic congestive heart failure.  No signs of fluid overload.  13.  Obesity.  BMI 35.25.  Dietary follow-up  14.  Incidental thyroid nodule measuring 1.5 cm.  Follow-up outpatient thyroid ultrasound 8.  Diabetes mellitus  and peripheral neuropathy.  Hemoglobin A1c 7.4.  SSI.  Patient on Glucotrol 5 mg daily, Glucophage 500 mg twice daily prior to admission.  Resume as needed  8.  Permissive hypertension.  Currently on Hygroton 25 mg daily.  Patient  also had been on hydralazine 50 mg twice daily, Imdur 60 mg daily Cozaar 100 mg daily prior to admission.  Resume as needed Vitals:   09/21/20 1952 09/22/20 0411  BP: 139/89 120/90  Pulse: 70 62  Resp: 17 18  Temp: 98.6 F (37 C) 98 F (36.7 C)  SpO2: 94% 98%  Permissive HT for now   9.  CKD stage III.  Follow-up chemistries  10.  Hyperlipidemia.  Lipitor  11.  COPD/tobacco abuse.  Counseling.  Continue inhalers as directed.  12.  Diastolic congestive heart failure.  Monitor for any signs of fluid overload.  13.  Obesity.  BMI 35.25.  Dietary follow-up  14.  Incidental thyroid nodule measuring 1.5 cm.  Follow-up outpatient thyroid ultrasoundi   LOS: 3 days A FACE TO FACE EVALUATION WAS PERFORMED  Charlett Blake 09/22/2020, 7:33 AM

## 2020-09-22 NOTE — Progress Notes (Addendum)
Speech Language Pathology Daily Session Note  Patient Details  Name: Craig Knight MRN: [id number] Date of Birth: 1965/05/04  Today's Date: 09/22/2020 SLP Individual Time: 0800-0900 SLP Individual Time Calculation (min): 60 min  Short Term Goals: Week 1: SLP Short Term Goal 1 (Week 1): Patient will demonstrate effective mastication and oral clearance with regular consistency PO trials prior to diet advancement. SLP Short Term Goal 2 (Week 1): Patient will demonstrate improved self monitoring and correction with use of speech intelligibility strategies at the sentence level with minA verbal cues. SLP Short Term Goal 3 (Week 1): Patient will demonstrate functional problem solving and/or reasoning for mildly complex tasks with mod A verbal/visual cues. SLP Short Term Goal 4 (Week 1): Patient will demonstrate recent recall (10 minutes or greater) of novel information with mod A verbal cues for use of compensatory strategies  Skilled Therapeutic Interventions: Patient agreeable to skilled ST intervention with focus on speech goals. SLP analyzed recall of speech intelligibility strategies discussed during previous session. Patient was able to recall 1/3 discussed strategies: "talk loud". Before implementation of speech intelligibility strategies patient was perceived as 25-50% intelligible at sentence level with difficulty effectively communicating functional needs and engaging in simple conversation. Patient perceived as 75% intelligible at sentence level with mod-to-max reminders to implement speech strategies. SLP facilitated use of visual decible meter app "BlaBlaBla" to enhance self monitoring skills. Patient was able to repair speech errors approximately 25% of the time with use of visual reinforcement and max A. Placed speech strategies handout on wall across from patient's bed. Checked in on patient at lunch to assess diet tolerance and swallow safety. Patient tolerating Dys 3 diet well with mildly  prolonged mastication and trace oral residue. No pocketing noted in right buccal cavity. Patient with no overt s/sx of aspiration. Consumed lunch safely with adherance to safe swallow precautions and safety including safe rate of consumption, small bite sizes, and one-bite-at-a-time. Recommend continuation of current diet with set-up assist. Patient no longer requires full supervision during meals. Patient was left in bed with alarm activated and immediate needs within reach at end of session. Continue per current plan of care.      Pain Pain Assessment Pain Scale: 0-10 Pain Score: 0-No pain  Therapy/Group: Individual Therapy  Jaima Janney T [place] Clabo 09/22/2020, 8:15 AM

## 2020-09-23 LAB — GLUCOSE, CAPILLARY
Glucose-Capillary: 136 mg/dL — ABNORMAL HIGH (ref 70–99)
Glucose-Capillary: 108 mg/dL — ABNORMAL HIGH (ref 70–99)
Glucose-Capillary: 117 mg/dL — ABNORMAL HIGH (ref 70–99)
Glucose-Capillary: 121 mg/dL — ABNORMAL HIGH (ref 70–99)

## 2020-09-23 MED ORDER — ALBUTEROL SULFATE HFA 108 (90 BASE) MCG/ACT IN AERS: 1.0000 | INHALATION_SPRAY | RESPIRATORY_TRACT | Status: DC | PRN

## 2020-09-23 MED ORDER — FUROSEMIDE 20 MG PO TABS
20.0000 mg | ORAL_TABLET | Freq: Once | ORAL | Status: AC
  Administered 2020-09-23: 20 mg via ORAL
  Filled 2020-09-23: qty 1

## 2020-09-23 NOTE — Progress Notes (Signed)
Physical Therapy Session Note  Patient Details  Name: Craig Knight MRN: [id number] Date of Birth: 06-Jan-1966  Today's Date: 09/23/2020 PT Individual Time: 1023-1103 and 1710-1736 PT Individual Time Calculation (min): 40 min and 26 min   Short Term Goals: Week 1:  PT Short Term Goal 1 (Week 1): Pt will perform supine<>sit with mod assist PT Short Term Goal 2 (Week 1): Pt will perform sit<>stands with mod assist PT Short Term Goal 3 (Week 1): Pt will perform bed<>chair transfers with mod assist PT Short Term Goal 4 (Week 1): Pt will ambulate at least 70f using LRAD with max assist of 1 and +2 for safety  Skilled Therapeutic Interventions/Progress Updates:    Session 1: Pt received sitting EOB and agreeable to therapy session. Donned tennis shoes max/total assist. R squat pivot EOB>w/c mod assist for lifting/pivoting with facilitation for proper RLE positioning and cuing for increased anterior weight shift to clear hips during transfer. Transported to/from gym in w/c for time management and energy conservation. R squat pivot to EOM as just described. Sit>stand EOM>no UE support with mod assist of 1 for balance and blocking R knee - pt demos some quad activation with increase compared to at eval. Standing R LE NMR and dynamic balance challenge via cross body reaching to grasp and give horseshoes to 2nd person promoting increased R hip/knee extension and weight shifting - mod assist for balance throughout with therapist blocking R knee.  Donned R LE DF assist ACE wrap. Gait training 2x342fwith 3 Musketeer support and +2 mod/max assist for gait with max/total assist for R LE management - at end of gait pt able to stand and maintain balance with mod assist of 1 while +2 retrieved w/c - pt not yet able to initiate swing phase advancement but does show some glute/quad activation during stance though requires knee block for safety. During 2nd trial therapist donned gait belt around pt's thigh to allow  improved control of swing advancement due to tendency to have excessive R hip external rotation. Cuing for improved upright posture and R weight shift during R stance.    L squat pivot w/c>EOB with mod assist of 1. Pt left sitting on EOB with needs in reach and bed alarm on with friend visiting.  Session 2: Pt received supine in bed with his wife, Craig Hawpresent and pt agreeable to therapy session despite reporting fatigue from earlier session. Supine bridging 2x15 reps with therapist facilitating R LE into hooklying position and providing intermittent min assist to prevent R knee from falling outwards during the exercise (pt show some improved control of this compared to at eval) - cuing for increased R glute activation to lift hips symmetrically. Supine>sitting L EOB, HOB flat but using bedrails as needed, via logroll technique to increase pt independence with mod assist for R hemibody management. Sit>stand EOB>no UE support with mod assist of 1 for lifting and blocking/guarding R knee buckle during session. Standing mini-squats 2x8 reps working on symmetrical WBSalinaor R LE NMR and cuing to "power up" into standing to promote increased R LE glute/quad activation - therapist facilitating at R LE to achieve hip/knee extended position with neutral pelvic rotation. At end of session pt left sitting on EOB with needs in reach, bed alarm on, meal tray set-up, and his wife present.   Therapy Documentation Precautions:  Precautions Precautions: Fall, Other (comment) Precaution Comments: L hemianopsia; dense R hemiparesis; Zio Patch Restrictions Weight Bearing Restrictions: No   Pain: Session 1: No  reports of pain throughout session.  Session 2: No reports of pain throughout session.   Therapy/Group: Individual Therapy  Tawana Scale , PT, DPT, NCS, CSRS 09/23/2020, 7:56 AM

## 2020-09-23 NOTE — Progress Notes (Signed)
Pt was educated on zio patch, and is not to be removed until directed by MD. After being removed by pt. Patch has been reapplied. Pt resting in bed with call light within reach.

## 2020-09-23 NOTE — Progress Notes (Signed)
PROGRESS NOTE   Subjective/Complaints: C/o SOB. Does not find inhaler helpful. Satting well. CXR reviewed and stable. Will trial nebs and I added alternative inhaler.  Makes good eye contact  ROS- neg CP, - bowel or bladder issues, +SOB  Objective:   DG CHEST PORT 1 VIEW  Result Date: 09/22/2020 CLINICAL DATA:  Shortness of breath. EXAM: PORTABLE CHEST 1 VIEW COMPARISON:  Single view of the chest 09/15/2020. FINDINGS: The heart size and mediastinal contours are within normal limits. The lungs are clear. The visualized skeletal structures are unremarkable. IMPRESSION: Negative chest. Electronically Signed   By: Inge Rise M.D.   On: 09/22/2020 17:08   No results for input(s): WBC, HGB, HCT, PLT in the last 72 hours. No results for input(s): NA, K, CL, CO2, GLUCOSE, BUN, CREATININE, CALCIUM in the last 72 hours.  Intake/Output Summary (Last 24 hours) at 09/23/2020 1628 Last data filed at 09/23/2020 1300 Gross per 24 hour  Intake 720 ml  Output 250 ml  Net 470 ml        Physical Exam: Vital Signs Blood pressure (!) 151/133, pulse 100, temperature 98.5 F (36.9 C), resp. rate 20, height '5\' 7"'$  (1.702 m), weight 99.1 kg, SpO2 97 %. Gen: no distress, normal appearing HEENT: oral mucosa pink and moist, NCAT Cardio: Reg rate Chest: normal effort, normal rate of breathing Abd: soft, non-distended Ext: no edema Psych: pleasant, normal affect Skin: No evidence of breakdown, no evidence of rash Neurologic: Cranial nerves II through XII intact, motor strength is 5/5 in Left and 0/5 right deltoid, bicep, tricep, grip, hip flexor, knee extensors, ankle dorsiflexor and plantar flexor Sensory exam normal sensation to light touch and proprioception in bilateral upper and lower extremities Cerebellar exam normal finger to nose to finger as well as heel to shin in bilateral upper and lower extremities Musculoskeletal: Full range of  motion in all 4 extremities. No joint swelling   Assessment/Plan: 1. Functional deficits which require 3+ hours per day of interdisciplinary therapy in a comprehensive inpatient rehab setting. Physiatrist is providing close team supervision and 24 hour management of active medical problems listed below. Physiatrist and rehab team continue to assess barriers to discharge/monitor patient progress toward functional and medical goals  Care Tool:  Bathing    Body parts bathed by patient: Chest, Abdomen, Right arm, Front perineal area, Right upper leg, Left upper leg, Left lower leg, Face   Body parts bathed by helper: Left arm, Buttocks     Bathing assist Assist Level: Moderate Assistance - Patient 50 - 74%     Upper Body Dressing/Undressing Upper body dressing   What is the patient wearing?: Pull over shirt    Upper body assist Assist Level: Moderate Assistance - Patient 50 - 74%    Lower Body Dressing/Undressing Lower body dressing      What is the patient wearing?: Pants, Underwear/pull up     Lower body assist Assist for lower body dressing: Maximal Assistance - Patient 25 - 49%     Toileting Toileting    Toileting assist Assist for toileting: 2 Helpers     Transfers Chair/bed transfer  Transfers assist     Chair/bed transfer  assist level: Dependent - mechanical lift (Stedy to the shower)     Locomotion Ambulation   Ambulation assist   Ambulation activity did not occur: Safety/medical concerns          Walk 10 feet activity   Assist  Walk 10 feet activity did not occur: Safety/medical concerns        Walk 50 feet activity   Assist Walk 50 feet with 2 turns activity did not occur: Safety/medical concerns         Walk 150 feet activity   Assist Walk 150 feet activity did not occur: Safety/medical concerns         Walk 10 feet on uneven surface  activity   Assist Walk 10 feet on uneven surfaces activity did not occur:  Safety/medical concerns         Wheelchair     Assist Will patient use wheelchair at discharge?:  (TBD)             Wheelchair 50 feet with 2 turns activity    Assist            Wheelchair 150 feet activity     Assist          Blood pressure (!) 151/133, pulse 100, temperature 98.5 F (36.9 C), resp. rate 20, height '5\' 7"'$  (1.702 m), weight 99.1 kg, SpO2 97 %.    A/P  1.  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 Refused loop recorder and ZIO patch added. No UE or LE movement so far on RIght side              -patient may  shower              -ELOS/Goals: 14-21 days - min A to supervision  Continue CIR  2.  Antithrombotics:  -DVT/anticoagulation: Lovenox              -antiplatelet therapy: Aspirin 81 mg daily and Brilinta 90 mg twice daily for 30 days then back to aspirin and Plavix DAPT  3. Pain Management: Neurontin 300 mg nightly   4. Mood: Trazodone 100 mg nightly              -antipsychotic agents: N/A  5. Neuropsych: This patient is capable of making decisions on his own behalf.  6. Skin/Wound Care: Routine skin checks  7. Fluids/Electrolytes/Nutrition: Routine in and outs with follow-up chemistries  8.  Diabetes mellitus and peripheral neuropathy.  Hemoglobin A1c 7.4.  SSI.  Patient on Glucotrol 5 mg daily, Glucophage 500 mg twice daily prior to admission.  Resume as needed CBG (last 3)  Recent Labs    09/22/20 2101 09/23/20 0622 09/23/20 1206  GLUCAP 123* 136* 108*   Controlled 7/30  8.  Permissive hypertension.  Currently on Hygroton 25 mg daily.  Patient also had been on hydralazine 50 mg twice daily, Imdur 60 mg daily Cozaar 100 mg daily prior to admission.  Resume as needed Controlled  9.  CKD stage III.  Follow-up chemistries  10.  Hyperlipidemia.  Lipitor  11.  COPD/tobacco abuse.   Counseling.  Continue inhalers as directed.  12.  Diastolic congestive heart failure.  No signs of fluid overload.  13.  Obesity.  BMI 35.25.  Dietary follow-up  14.  Incidental thyroid nodule measuring 1.5 cm.  Follow-up outpatient thyroid ultrasound 8.  Diabetes mellitus and peripheral neuropathy.  Hemoglobin A1c 7.4.  SSI.  Patient on Glucotrol 5 mg daily, Glucophage 500 mg twice daily prior to admission.  Resume as needed  8.  Permissive hypertension.  Currently on Hygroton 25 mg daily.  Patient also had been on hydralazine 50 mg twice daily, Imdur 60 mg daily Cozaar 100 mg daily prior to admission.  Resume as needed Vitals:   09/23/20 0412 09/23/20 1317  BP: (!) 130/96 (!) 151/133  Pulse: 70 100  Resp: 18 20  Temp: 97.9 F (36.6 C) 98.5 F (36.9 C)  SpO2: 95% 97%  Permissive HT for now  Diastolic elevated to [id number]. Given shortness of breath and significant elevation and CHF, will give 20 mg lasix  9.  CKD stage III.  Follow-up chemistries  10.  Hyperlipidemia.  Lipitor  11.  COPD/tobacco abuse.  Counseling.  Continue inhalers as directed.  12.  Diastolic congestive heart failure.  Monitor for any signs of fluid overload.  13.  Obesity.  BMI 35.25.  Dietary follow-up  14.  Incidental thyroid nodule measuring 1.5 cm.  Follow-up outpatient thyroid ultrasoundi   15. SOB: CXR reviewed and stable. Satting well on room air. Trial nebulizer. Added alternative PRN inhaler since he does not find current helpful.   LOS: 4 days A FACE TO FACE EVALUATION WAS PERFORMED  Clide Deutscher Bryer Gottsch 09/23/2020, 4:28 PM

## 2020-09-23 NOTE — Progress Notes (Signed)
Occupational Therapy Session Note  Patient Details  Name: Craig Knight MRN: [id number] Date of Birth: 06/24/1965  Today's Date: 09/23/2020 Session 1 OT Individual Time: 9476-5465 OT Individual Time Calculation (min): 57 min   Session 2 OT Individual Time: 0354-6568 OT Individual Time Calculation (min): 57 min    Short Term Goals: Week 1:  OT Short Term Goal 1 (Week 1): Pt wll complete UB bathing at min assist sitting unsupported. OT Short Term Goal 2 (Week 1): Pt will complete UB dressing with min assist to donn a pullover shirt. OT Short Term Goal 3 (Week 1): Pt will complete LB bathing with mod assist sit to stand for two consecutive sessions. OT Short Term Goal 4 (Week 1): Pt will complete toilet transfers with mod assist squat pivot to the drop arm commode.  Skilled Therapeutic Interventions/Progress Updates:    Session 1 Pt greeted seated EOB finishing breakfast. OT educated on self-feeding techniques using one hand. OT discussed goals for therapy and talked through patients goals for himself while here in rehab. Pt declined any bathing this am but wanted to get on pants. Worked on anterior weight shifting with LB dressing. OT assist to thread R LE and attemoted to thread L but still needed assist. Sit<>stand with Stedy with min A, then OT assist to button pants. Educated on getting elastic waist pants in increase independence. Pt then transferred over to the sink and completed toothbrushing task perched on stedy. Educated on using R hand as a stabilizer to hold toothbrush while applying toothpaste. Pt then brought to therapy gym and worked on sit<>stands and R UE NMR with weight bearing towel pushes at standing frame. 3 sets of 50 with some scapular and shoulder activation noted. Pt returned to room and left seated in wc with nurse tech present to assist to the bathroom.   Session 2 Pt greeted seated EOB after lunch and agreeable to OT treatment session. Worked on donning socks and  shoes and anterior weight shift to pull socks up. Pt needed max A. Stand-pivot transfer with max A R and mod A L. Pt's pants were unbuttoned and fell down. Pt brought to the sink to work on sit<>stands to pull them up with mod A, max A to button. Pt reported ne heeded his inhaler and could not breathe, although he appeared to be taking appropriate breaths and SpO2 99%. Nursing administered inhaler per pt request. Pt brought down to therapy gym and completed squat-pivot to therapy mat with max A. Pt brought into gravity eliminated side-lying position for R UE NMR. OT placed R UE in hand skate and placed in support position on table. Worked on elbow flex/ext and scapular protraction/retraction. Some flexor synergy activated in gravity eliminated position. OT provided joint input to bring pt through full ROM. Pt reported max fatigue and requested to return to the room. Mod A squat-pivot back to bed with use of bed handrail. Pt needed OT assist to lift LE's back in bed. Pt reported he needed his inhaler again, despite breathing looking fine. OT informed nursing of pt request. Pt left semi-reclined in bed with bed alarm on, call bell in reach, and needs met.   Therapy Documentation Precautions:  Precautions Precautions: Fall, Other (comment) Precaution Comments: L hemianopsia; dense R hemiparesis; Zio Patch Restrictions Weight Bearing Restrictions: No Pain:  Denies pain    Therapy/Group: Individual Therapy  Valma Cava 09/23/2020, 2:02 PM

## 2020-09-24 LAB — GLUCOSE, CAPILLARY
Glucose-Capillary: 137 mg/dL — ABNORMAL HIGH (ref 70–99)
Glucose-Capillary: 99 mg/dL (ref 70–99)
Glucose-Capillary: 130 mg/dL — ABNORMAL HIGH (ref 70–99)
Glucose-Capillary: 136 mg/dL — ABNORMAL HIGH (ref 70–99)

## 2020-09-24 NOTE — Progress Notes (Signed)
Pt finished neb treatment, "I feel better". Sats 100%, HR 90. Lungs remain clear and diminished BIL. Pt instructed to call staff is another episode of SOB occurs, pt verbalized understanding

## 2020-09-24 NOTE — Progress Notes (Signed)
Pt c/o SOB, sats 100% HR 95, lungs clear/diminished BIL. PRN neb given. Will continue to monitor

## 2020-09-24 NOTE — Progress Notes (Signed)
PROGRESS NOTE   Subjective/Complaints: No complaints this morning Being transferred in [place] to bathroom Friend is visiting  ROS- denies CP, - bowel or bladder issues, +SOB  Objective:   DG CHEST PORT 1 VIEW  Result Date: 09/22/2020 CLINICAL DATA:  Shortness of breath. EXAM: PORTABLE CHEST 1 VIEW COMPARISON:  Single view of the chest 09/15/2020. FINDINGS: The heart size and mediastinal contours are within normal limits. The lungs are clear. The visualized skeletal structures are unremarkable. IMPRESSION: Negative chest. Electronically Signed   By: Inge Rise M.D.   On: 09/22/2020 17:08   No results for input(s): WBC, HGB, HCT, PLT in the last 72 hours. No results for input(s): NA, K, CL, CO2, GLUCOSE, BUN, CREATININE, CALCIUM in the last 72 hours.  Intake/Output Summary (Last 24 hours) at 09/24/2020 1256 Last data filed at 09/24/2020 0715 Gross per 24 hour  Intake 660 ml  Output 775 ml  Net -115 ml        Physical Exam: Vital Signs Blood pressure 114/88, pulse 66, temperature 97.8 F (36.6 C), temperature source Oral, resp. rate 18, height '5\' 7"'$  (1.702 m), weight 95.6 kg, SpO2 92 %. Gen: no distress, normal appearing HEENT: oral mucosa pink and moist, NCAT Cardio: Reg rate Chest: normal effort, normal rate of breathing Abd: soft, non-distended Ext: no edema Psych: pleasant, normal affect Skin: No evidence of breakdown, no evidence of rash Neurologic: Cranial nerves II through XII intact, motor strength is 5/5 in Left and 0/5 right deltoid, bicep, tricep, grip, hip flexor, knee extensors, ankle dorsiflexor and plantar flexor Sensory exam normal sensation to light touch and proprioception in bilateral upper and lower extremities Cerebellar exam normal finger to nose to finger as well as heel to shin in bilateral upper and lower extremities Musculoskeletal: Full range of motion in all 4 extremities. No joint  swelling   Assessment/Plan: 1. Functional deficits which require 3+ hours per day of interdisciplinary therapy in a comprehensive inpatient rehab setting. Physiatrist is providing close team supervision and 24 hour management of active medical problems listed below. Physiatrist and rehab team continue to assess barriers to discharge/monitor patient progress toward functional and medical goals  Care Tool:  Bathing    Body parts bathed by patient: Chest, Abdomen, Right arm, Front perineal area, Right upper leg, Left upper leg, Left lower leg, Face   Body parts bathed by helper: Left arm, Buttocks     Bathing assist Assist Level: Moderate Assistance - Patient 50 - 74%     Upper Body Dressing/Undressing Upper body dressing   What is the patient wearing?: Pull over shirt    Upper body assist Assist Level: Moderate Assistance - Patient 50 - 74%    Lower Body Dressing/Undressing Lower body dressing      What is the patient wearing?: Pants, Underwear/pull up     Lower body assist Assist for lower body dressing: Maximal Assistance - Patient 25 - 49%     Toileting Toileting    Toileting assist Assist for toileting: 2 Helpers     Transfers Chair/bed transfer  Transfers assist     Chair/bed transfer assist level: Moderate Assistance - Patient 50 - 74% (squat pivot)  Locomotion Ambulation   Ambulation assist   Ambulation activity did not occur: Safety/medical concerns  Assist level: 2 helpers Assistive device: Other (comment) (3 Musketeer) Max distance: 70f   Walk 10 feet activity   Assist  Walk 10 feet activity did not occur: Safety/medical concerns  Assist level: 2 helpers (Pharmacist, hospital Assistive device: Other (comment)   Walk 50 feet activity   Assist Walk 50 feet with 2 turns activity did not occur: Safety/medical concerns         Walk 150 feet activity   Assist Walk 150 feet activity did not occur: Safety/medical concerns          Walk 10 feet on uneven surface  activity   Assist Walk 10 feet on uneven surfaces activity did not occur: Safety/medical concerns         Wheelchair     Assist Will patient use wheelchair at discharge?:  (TBD)             Wheelchair 50 feet with 2 turns activity    Assist            Wheelchair 150 feet activity     Assist          Blood pressure 114/88, pulse 66, temperature 97.8 F (36.6 C), temperature source Oral, resp. rate 18, height '5\' 7"'$  (1.702 m), weight 95.6 kg, SpO2 92 %.    A/P  1.  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 Refused loop recorder and ZIO patch added. No UE or LE movement so far on RIght side              -patient may  shower              -ELOS/Goals: 14-21 days - min A to supervision  Continue CIR  2.  Antithrombotics:  -DVT/anticoagulation: Lovenox              -antiplatelet therapy: Aspirin 81 mg daily and Brilinta 90 mg twice daily for 30 days then back to aspirin and Plavix DAPT  3. Pain Management: Neurontin 300 mg nightly   4. Mood: Trazodone 100 mg nightly              -antipsychotic agents: N/A  5. Neuropsych: This patient is capable of making decisions on his own behalf.  6. Skin/Wound Care: Routine skin checks  7. Fluids/Electrolytes/Nutrition: Routine in and outs with follow-up chemistries  8.  Diabetes mellitus and peripheral neuropathy.  Hemoglobin A1c 7.4.  SSI.  Patient on Glucotrol 5 mg daily, Glucophage 500 mg twice daily prior to admission.  Resume as needed CBG (last 3)  Recent Labs    09/23/20 2104 09/24/20 0620 09/24/20 1116  GLUCAP 121* 137* 99   Controlled 7/30  8.  Permissive hypertension.  Currently on Hygroton 25 mg daily.  Patient also had been on hydralazine 50 mg twice daily, Imdur 60 mg daily Cozaar 100 mg daily prior to admission.  Resume as  needed Controlled  9.  CKD stage III.  Follow-up chemistries  10.  Hyperlipidemia.  Lipitor  11.  COPD/tobacco abuse.  Counseling.  Continue inhalers as directed.  12.  Diastolic congestive heart failure.  No signs of fluid overload.  13.  Obesity.  BMI 35.25.  Dietary follow-up  14.  Incidental thyroid nodule measuring 1.5 cm.  Follow-up outpatient thyroid ultrasound 8.  Diabetes mellitus and peripheral neuropathy.  Hemoglobin A1c 7.4.  SSI.  Patient on Glucotrol 5 mg daily, Glucophage 500 mg twice daily prior to admission.  Resume as needed  8.  Permissive hypertension.  Currently on Hygroton 25 mg daily.  Patient also had been on hydralazine 50 mg twice daily, Imdur 60 mg daily Cozaar 100 mg daily prior to admission.  Resume as needed Vitals:   09/23/20 1928 09/24/20 0409  BP: (!) 130/106 114/88  Pulse: 88 66  Resp: 18 18  Temp: 98 F (36.7 C) 97.8 F (36.6 C)  SpO2: 98% 92%  Permissive HT for now  [id number]: Diastolic elevated to Q000111Q. Given shortness of breath and significant elevation and CHF, will give 20 mg lasix 7/31: BP well controlled, continue current regimen.   9.  CKD stage III.  Follow-up chemistries  10.  Hyperlipidemia.  Lipitor  11.  COPD/tobacco abuse.  Counseling.  Continue inhalers as directed.  12.  Diastolic congestive heart failure.  Monitor for any signs of fluid overload.  13.  Obesity.  BMI 35.25.  Dietary follow-up  14.  Incidental thyroid nodule measuring 1.5 cm.  Follow-up outpatient thyroid ultrasoundi   15. SOB: CXR reviewed and stable. Satting well on room air. Trial nebulizer. Added alternative PRN inhaler since he does not find current helpful.   7/31: denies further SOB, continue current regimen.   LOS: 5 days A FACE TO FACE EVALUATION WAS PERFORMED  Craig Knight 09/24/2020, 12:56 PM

## 2020-09-24 NOTE — Progress Notes (Signed)
Pt stated they have questions about medications and requested to speak with MD. Pt unable to stated which medications they have questions about and decided to wait until am rounds to speak with MD.

## 2020-09-25 LAB — GLUCOSE, CAPILLARY
Glucose-Capillary: 124 mg/dL — ABNORMAL HIGH (ref 70–99)
Glucose-Capillary: 117 mg/dL — ABNORMAL HIGH (ref 70–99)
Glucose-Capillary: 119 mg/dL — ABNORMAL HIGH (ref 70–99)
Glucose-Capillary: 134 mg/dL — ABNORMAL HIGH (ref 70–99)

## 2020-09-25 MED ORDER — TIZANIDINE HCL 2 MG PO TABS
2.0000 mg | ORAL_TABLET | Freq: Three times a day (TID) | ORAL | Status: DC
  Administered 2020-09-25 – 2020-10-13 (×56): 2 mg via ORAL
  Filled 2020-09-25 (×55): qty 1

## 2020-09-25 NOTE — Progress Notes (Signed)
PROGRESS NOTE   Subjective/Complaints: Pt feels down about his stroke, , given his degree of weakness do not expect a full recovery , but is still early stage of recovery post CVA and improvements are expected , has some shaking ofRIght  arm and leg  ROS- denies CP, - bowel or bladder issues, -SOB  Objective:   No results found. No results for input(s): WBC, HGB, HCT, PLT in the last 72 hours. No results for input(s): NA, K, CL, CO2, GLUCOSE, BUN, CREATININE, CALCIUM in the last 72 hours.  Intake/Output Summary (Last 24 hours) at 09/25/2020 0706 Last data filed at 09/25/2020 0620 Gross per 24 hour  Intake 600 ml  Output 500 ml  Net 100 ml         Physical Exam: Vital Signs Blood pressure 136/84, pulse 65, temperature 97.9 F (36.6 C), temperature source Oral, resp. rate 16, height '5\' 7"'$  (1.702 m), weight 96.1 kg, SpO2 96 %.  General: No acute distress Mood and affect are appropriate Heart: Regular rate and rhythm no rubs murmurs or extra sounds Lungs: Clear to auscultation, breathing unlabored, no rales or wheezes Abdomen: Positive bowel sounds, soft nontender to palpation, nondistended Extremities: No clubbing, cyanosis, or edema Skin: No evidence of breakdown, no evidence of rash  Neurologic: Cranial nerves II through XII intact, motor strength is 5/5 in Left and 0/5 right deltoid, bicep, tricep, grip, hip flexor, knee extensors, ankle dorsiflexor and plantar flexor Sensory exam normal sensation to light touch and proprioception in bilateral upper and lower extremities Tone- clonus RIght pronators  Musculoskeletal: Full range of motion in all 4 extremities. No joint swelling   Assessment/Plan: 1. Functional deficits which require 3+ hours per day of interdisciplinary therapy in a comprehensive inpatient rehab setting. Physiatrist is providing close team supervision and 24 hour management of active medical problems  listed below. Physiatrist and rehab team continue to assess barriers to discharge/monitor patient progress toward functional and medical goals  Care Tool:  Bathing    Body parts bathed by patient: Chest, Abdomen, Right arm, Front perineal area, Right upper leg, Left upper leg, Left lower leg, Face   Body parts bathed by helper: Left arm, Buttocks     Bathing assist Assist Level: Moderate Assistance - Patient 50 - 74%     Upper Body Dressing/Undressing Upper body dressing   What is the patient wearing?: Pull over shirt    Upper body assist Assist Level: Moderate Assistance - Patient 50 - 74%    Lower Body Dressing/Undressing Lower body dressing      What is the patient wearing?: Pants, Underwear/pull up     Lower body assist Assist for lower body dressing: Maximal Assistance - Patient 25 - 49%     Toileting Toileting    Toileting assist Assist for toileting: 2 Helpers     Transfers Chair/bed transfer  Transfers assist     Chair/bed transfer assist level: Moderate Assistance - Patient 50 - 74% (squat pivot)     Locomotion Ambulation   Ambulation assist   Ambulation activity did not occur: Safety/medical concerns  Assist level: 2 helpers Assistive device: Other (comment) (3 Musketeer) Max distance: 33f   Walk  10 feet activity   Assist  Walk 10 feet activity did not occur: Safety/medical concerns  Assist level: 2 helpers Pharmacist, hospital) Assistive device: Other (comment)   Walk 50 feet activity   Assist Walk 50 feet with 2 turns activity did not occur: Safety/medical concerns         Walk 150 feet activity   Assist Walk 150 feet activity did not occur: Safety/medical concerns         Walk 10 feet on uneven surface  activity   Assist Walk 10 feet on uneven surfaces activity did not occur: Safety/medical concerns         Wheelchair     Assist Will patient use wheelchair at discharge?:  (TBD)             Wheelchair 50  feet with 2 turns activity    Assist            Wheelchair 150 feet activity     Assist          Blood pressure 136/84, pulse 65, temperature 97.9 F (36.6 C), temperature source Oral, resp. rate 16, height '5\' 7"'$  (1.702 m), weight 96.1 kg, SpO2 96 %.    A/P  1.  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 Refused loop recorder and ZIO patch added. No UE or LE movement so far on RIght side              -patient may  shower              -ELOS/Goals: 14-21 days - min A to supervision  Continue CIR  2.  Antithrombotics:  -DVT/anticoagulation: Lovenox              -antiplatelet therapy: Aspirin 81 mg daily and Brilinta 90 mg twice daily for 30 days then back to aspirin and Plavix DAPT  3. Pain Management: Neurontin 300 mg nightly   4. Mood: Trazodone 100 mg nightly              -antipsychotic agents: N/A  5. Neuropsych: This patient is capable of making decisions on his own behalf.  6. Skin/Wound Care: Routine skin checks  7. Fluids/Electrolytes/Nutrition: Routine in and outs with follow-up chemistries  8.  Diabetes mellitus and peripheral neuropathy.  Hemoglobin A1c 7.4.  SSI.  Patient on Glucotrol 5 mg daily, Glucophage 500 mg twice daily prior to admission.  Resume as needed CBG (last 3)  Recent Labs    09/24/20 1646 09/24/20 2109 09/25/20 0621  GLUCAP 130* 136* 124*    Controlled 8/1  8.  Permissive hypertension.  Currently on Hygroton 25 mg daily.  Patient also had been on hydralazine 50 mg twice daily, Imdur 60 mg daily Cozaar 100 mg daily prior to admission.  Resume as needed Controlled  9.  CKD stage III.  Follow-up chemistries  10.  Hyperlipidemia.  Lipitor  11.  COPD/tobacco abuse.  Counseling.  Continue inhalers as directed.  12.  Diastolic congestive heart failure.  No signs of fluid overload.  13.  Obesity.   BMI 35.25.  Dietary follow-up  14.  Incidental thyroid nodule measuring 1.5 cm.  Follow-up outpatient thyroid ultrasound 8.  Diabetes mellitus and peripheral neuropathy.  Hemoglobin A1c 7.4.  SSI.  Patient on Glucotrol 5 mg daily, Glucophage 500 mg twice daily prior to admission.  Resume as needed  8.  Permissive hypertension.  Currently on Hygroton 25  mg daily.  Patient also had been on hydralazine 50 mg twice daily, Imdur 60 mg daily Cozaar 100 mg daily prior to admission.  Resume as needed Vitals:   09/24/20 1951 09/25/20 0405  BP: (!) 133/91 136/84  Pulse: 89 65  Resp: 18 16  Temp: 97.8 F (36.6 C) 97.9 F (36.6 C)  SpO2: 96% 0000000   [id number]: Diastolic elevated to Q000111Q. Given shortness of breath and significant elevation and CHF, will give 20 mg lasix 8/1: BP well controlled, continue current regimen.   9.  CKD stage III.  Follow-up chemistries  10.  Hyperlipidemia.  Lipitor  11.  COPD/tobacco abuse.  Counseling.  Continue inhalers as directed.  12.  Diastolic congestive heart failure.  Monitor for any signs of fluid overload.  13.  Obesity.  BMI 35.25.  Dietary follow-up  14.  Incidental thyroid nodule measuring 1.5 cm.  Follow-up outpatient thyroid ultrasoundi   15. SOB: CXR reviewed and stable. Satting well on room air. Trial nebulizer. Added alternative PRN inhaler since he does not find current helpful.   8/11: denies further SOB, continue current regimen.   16.  Spasticity which bothers pt , will start low dose tizanidine , monitor BP  LOS: 6 days A FACE TO FACE EVALUATION WAS PERFORMED  Charlett Blake 09/25/2020, 7:06 AM

## 2020-09-25 NOTE — Progress Notes (Signed)
Speech Language Pathology Daily Session Note  Patient Details  Name: Craig Knight MRN: [id number] Date of Birth: [birth year]  Today's Date: 09/25/2020 SLP Individual Time: 8786-7672 SLP Individual Time Calculation (min): 45 min  Short Term Goals: Week 1: SLP Short Term Goal 1 (Week 1): Patient will demonstrate effective mastication and oral clearance with regular consistency PO trials prior to diet advancement. SLP Short Term Goal 2 (Week 1): Patient will demonstrate improved self monitoring and correction with use of speech intelligibility strategies at the sentence level with minA verbal cues. SLP Short Term Goal 3 (Week 1): Patient will demonstrate functional problem solving and/or reasoning for mildly complex tasks with mod A verbal/visual cues. SLP Short Term Goal 4 (Week 1): Patient will demonstrate recent recall (10 minutes or greater) of novel information with mod A verbal cues for use of compensatory strategies  Skilled Therapeutic Interventions: Patient agreeable to skilled ST intervention with focus on cognitive goals. Facilitated basic reasoning and sustained attention task with mod A verbal cues initially, progressing to min-to-mod A semantic cues for problem solving and recall. Facilitated problem solving for getting needs addressed in room, as patient complained of needing additional assistance beyond what he was given for meal set-up today. Patient denied pressing call bell to request for additional help and was encouraged to notify staff of needs that aren't being met. Patient exhibited minimal carryover of speech intelligibility strategies from previous session. He was able to recall strategies "talk slow, talk loud" while referring to visual on his wall. He required mod-to-max A verbal reminders to implement speech intelligibility strategies at sentence level. Perceived as 25-50% intelligible without strategies, and 50-75% intelligible with use of strategies during today's session.  Patient was left in bed with alarm activated and immediate needs within reach at end of session. Continue per current plan of care.      Pain Pain Assessment Pain Scale: 0-10 Pain Score: 0-No pain  Therapy/Group: Individual Therapy  Evelette Hollern T Deklyn Gibbon 09/25/2020, 9:06 AM

## 2020-09-25 NOTE — Progress Notes (Signed)
Physical Therapy Session Note  Patient Details  Name: Craig Knight MRN: [id number] Date of Birth: [birth year]  Today's Date: 09/25/2020 PT Individual Time: 1400-1515 PT Individual Time Calculation (min): 75 min   Short Term Goals: Week 1:  PT Short Term Goal 1 (Week 1): Pt will perform supine<>sit with mod assist PT Short Term Goal 2 (Week 1): Pt will perform sit<>stands with mod assist PT Short Term Goal 3 (Week 1): Pt will perform bed<>chair transfers with mod assist PT Short Term Goal 4 (Week 1): Pt will ambulate at least 28f using LRAD with max assist of 1 and +2 for safety  Skilled Therapeutic Interventions/Progress Updates:    Pt received seated in w/c in room, agreeable to PT session. No complaints of pain. Dependent transport via w/c to therapy gym. Stand pivot transfer w/c to mat table with max A to the L. Sit to stand with [facility] to [facility]. Pt with fair ability to maintain upright standing posture with use of HW. Squat pivot transfer to w/c with mod A. Sit to stand in // bars with min A. Session focus on standing balance and RLE NMR in standing in // bars. Standing R/L weight shifting with use of mirror for visual feedback for equal WBing through BLE as pt tends to stand on LLE only. While in standing pt able to reach with LUE for clothespins and bean bags on L side of body and place on R side of body, mod to max A for standing balance with R knee blocked. Pt with frequent LOB to the R, unable to correct without assist from LUE on // bar. Pt requests to return to bed at end of session due to fatigue. Squat pivot transfer w/c to bed with mod A. Pt left seated EOB with wife present, bed alarm in place.  Therapy Documentation Precautions:  Precautions Precautions: Fall, Other (comment) Precaution Comments: L hemianopsia; dense R hemiparesis; Zio Patch Restrictions Weight Bearing Restrictions: No     Therapy/Group: Individual Therapy   TExcell Seltzer PT, DPT,  CSRS  09/25/2020, 5:01 PM

## 2020-09-25 NOTE — Progress Notes (Signed)
Occupational Therapy Session Note  Patient Details  Name: Craig Knight MRN: [id number] Date of Birth: 1965-10-10  Today's Date: 09/25/2020 OT Individual Time: [id number] Individual Time Calculation (min): 72 min    Short Term Goals: Week 1:  OT Short Term Goal 1 (Week 1): Pt wll complete UB bathing at min assist sitting unsupported. OT Short Term Goal 2 (Week 1): Pt will complete UB dressing with min assist to donn a pullover shirt. OT Short Term Goal 3 (Week 1): Pt will complete LB bathing with mod assist sit to stand for two consecutive sessions. OT Short Term Goal 4 (Week 1): Pt will complete toilet transfers with mod assist squat pivot to the drop arm commode.  Skilled Therapeutic Interventions/Progress Updates:  Pt greeted supine in bed, reporting chest pain, checked with RN who reports pt okay to participate in therapy HR WFL and Spo2 96% on RA. Pt completed bed mobility with MIN A and MAXA for LB dressing to don pants from EOB, increased time needed to initially thread RLE into pants, MOD A to stand to pull pants up to waist line. Pt completed squat pivot transfer to w/c from EOB with MOD A. Pt transported to day room with total A for time mgmt. Remainder of session to focus on various RUE therapeutic activities such as towel slides with LUE over RUE on side table including horizontal ADD/ ABD, circumduction and scapular protraction<>retraction. Pt completed 1 min and 30 mins of each exercises. Pt additionally worked core strengthening exercises with pt completed x20 reps of modified crunches with therapy ball positioned behind pt. Pt additionally completed x20 reps of NMR with RUE via lateral leans to therapy ball with CGA for balance. Pt completed squat pivot back to w/c in similar fashion as previously indicated, where pt was transported back to room, pt left seated in w/c with alarm belt activated and all needs within reach.   Therapy Documentation Precautions:   Precautions Precautions: Fall, Other (comment) Precaution Comments: L hemianopsia; dense R hemiparesis; Zio Patch Restrictions Weight Bearing Restrictions: No  Pain: Pt reports pain in R shoulder during therapeutic activities, rest breaks, repositioning and modification of ROM provided as pain mgmt strategies.    Therapy/Group: Individual Therapy  Precious Haws 09/25/2020, 3:45 PM

## 2020-09-26 LAB — GLUCOSE, CAPILLARY
Glucose-Capillary: 149 mg/dL — ABNORMAL HIGH (ref 70–99)
Glucose-Capillary: 114 mg/dL — ABNORMAL HIGH (ref 70–99)
Glucose-Capillary: 133 mg/dL — ABNORMAL HIGH (ref 70–99)
Glucose-Capillary: 144 mg/dL — ABNORMAL HIGH (ref 70–99)

## 2020-09-26 MED ORDER — COVID-19 MRNA VACC (MODERNA) 50 MCG/0.25ML IM SUSP
0.2500 mL | Freq: Once | INTRAMUSCULAR | Status: AC
  Administered 2020-09-27: 0.25 mL via INTRAMUSCULAR
  Filled 2020-09-26: qty 0.25

## 2020-09-26 NOTE — Progress Notes (Signed)
Speech Language Pathology Daily Session Note  Patient Details  Name: Craig Knight MRN: [id number] Date of Birth: 1966-01-18  Today's Date: 09/26/2020 SLP Individual Time: 0800-0900 SLP Individual Time Calculation (min): 60 min  Short Term Goals: Week 1: SLP Short Term Goal 1 (Week 1): Patient will demonstrate effective mastication and oral clearance with regular consistency PO trials prior to diet advancement. SLP Short Term Goal 2 (Week 1): Patient will demonstrate improved self monitoring and correction with use of speech intelligibility strategies at the sentence level with minA verbal cues. SLP Short Term Goal 3 (Week 1): Patient will demonstrate functional problem solving and/or reasoning for mildly complex tasks with mod A verbal/visual cues. SLP Short Term Goal 4 (Week 1): Patient will demonstrate recent recall (10 minutes or greater) of novel information with mod A verbal cues for use of compensatory strategies  Skilled Therapeutic Interventions: Patient agreeable to skilled ST intervention with focus on cognitive-linguistic goals. Facilitated oral care with set-up assist. Educated patient on internal and external memory strategies. SLP facilitated short-term memory task with implementation of strategies with 50% accuracy with immediate recall, 75% accuracy after 5 minute delay, and 100% accuracy after 10 minute delay. Patient demonstrated improvement self monitoring and implementing speech intelligibility a strategies during today's session. Speech was perceived as 75% intelligible at sentence level with repair of speech breakdowns during 50-75% of occasions at independent level. Patient was left in bed with alarm activated and immediate needs within reach at end of session. Continue per current plan of care.      Pain Pain Assessment Pain Scale: 0-10 Pain Score: 0-No pain  Therapy/Group: Individual Therapy  Lanier Felty T Juelle Dickmann 09/26/2020, 8:22 AM

## 2020-09-26 NOTE — Progress Notes (Signed)
Occupational Therapy Session Note  Patient Details  Name: Craig Knight MRN: [id number] Date of Birth: 06/18/1965  Today's Date: 09/26/2020 OT Individual Time: 1000-1114 OT Individual Time Calculation (min): 74 min    Short Term Goals: Week 1:  OT Short Term Goal 1 (Week 1): Pt wll complete UB bathing at min assist sitting unsupported. OT Short Term Goal 2 (Week 1): Pt will complete UB dressing with min assist to donn a pullover shirt. OT Short Term Goal 3 (Week 1): Pt will complete LB bathing with mod assist sit to stand for two consecutive sessions. OT Short Term Goal 4 (Week 1): Pt will complete toilet transfers with mod assist squat pivot to the drop arm commode.  Skilled Therapeutic Interventions/Progress Updates:    Pt received EOB agreeable to OT intervention. Pt currently requires MAX A for LB dressing with underwear and shorts, education provided on hemi dressing techniques as well as compensatory strategies for LB Dressing. MOD A for sit<>stand from EOB with HW to pull pants/ underwear up to waist line. Pt completed squat pivot transfer from EOB>w/c to pts R side with MAX A. Pt trasported to sink for seated grooming tasks with MIN A to set- up toothbrush using R hand as gross stabilizer. Pt additonally completed UB bathing at sink with RUE positioned on sink for NMR, MOD A overall for UB bathing.  Pt transported to gym with total A, where remainder of session to focus on standing tolerance, NMR on RLE and transfer training. Pt required MOD A for squt pivot transfer from w/c> EOM. Pt completed various therapeutic activities in standing with pt needing MIN - MOD A to stand with HW from EOM. Pt reaching with LUE to retrieve items on R side, assist needed to shift weight laterally to RLE when reaching laterally. Pt additionally completed lateral leans to RUE for NMR on R UE from sitting EOM. Pt completed additionally squat pivot transfer from EOM>w/c with MOD A. Pt transported back to room  with total A where pt was left seated EOB with bed alarm activated.   Therapy Documentation Precautions:  Precautions Precautions: Fall, Other (comment) Precaution Comments: L hemianopsia; dense R hemiparesis; Zio Patch Restrictions Weight Bearing Restrictions: No  Pain: No pain reported during session    Therapy/Group: Individual Therapy  Corinne Ports [facility] 09/26/2020, 12:16 PM

## 2020-09-26 NOTE — Progress Notes (Signed)
Physical Therapy Session Note  Patient Details  Name: Craig Knight MRN: [id number] Date of Birth: Mar 03, 1965  Today's Date: 09/26/2020 PT Individual Time: 1640-1735 PT Individual Time Calculation (min): 55 min   Short Term Goals: Week 1:  PT Short Term Goal 1 (Week 1): Pt will perform supine<>sit with mod assist PT Short Term Goal 2 (Week 1): Pt will perform sit<>stands with mod assist PT Short Term Goal 3 (Week 1): Pt will perform bed<>chair transfers with mod assist PT Short Term Goal 4 (Week 1): Pt will ambulate at least 77f using LRAD with max assist of 1 and +2 for safety  Skilled Therapeutic Interventions/Progress Updates:   Pt received sitting in recliner and agreeable to therapy session. Donned tennis shoes max assist. L squat/stand pivot recliner>w/c with mod assist for balance and pivoting hips - facilitation for R LE positioning.  Transported to/from gym in w/c for time management and energy conservation. Sit>stand from w/c with no UE support with mod assist for balance cuing/facilitation for increased R hip/knee extension and R weight shifting - continues to have R posteriorly rotated pelvis requiring facilitation to improve. Progressed to x5 reps mini-squats with continued mod assist for balance and R LE facilitation.  Donned R LE DF assist ACE wrap. Gait training [id number] (seated break between each) via 3 Musketeer support and +2 mod assist (pt able to maintain standing balance with L UE support on hallway rail with min/mod assist from therapist while +2 assist retrieved w/c) - therapist initially standing to provide R LE facilitation; however, transitioned to sitting on stool to provide more specific facilitation as pt showed safe trunk control with +2 assist  - max assist provided for R LE advancement during swing with pt demoing limited to no hip/knee flexion activation  - max assist to facilitate R hip/knee extension and R weight shift with improved pelvic alignment  during stance (pt tends to keep R pelvis rotated posteriorly); however, despite quad weakness pt has no indications of buckling  Transported back to room. R squat/stand pivot w/c>recliner with mod assist for pivoting hips and balance - continues to require manual facilitation to position R LE for transfer. Pt left seated in recliner with needs in reach, seat belt alarm on, and RUE supported on pillow.    Therapy Documentation Precautions:  Precautions Precautions: Fall, Other (comment) Precaution Comments: L hemianopsia; dense R hemiparesis; Zio Patch Restrictions Weight Bearing Restrictions: No   Pain:  No reports of pain throughout session.   Therapy/Group: Individual Therapy  CarTawana ScalePT, DPT, NCS, CSRS  09/26/2020, 3:15 PM

## 2020-09-26 NOTE — Progress Notes (Signed)
Occupational Therapy Weekly Progress Note  Patient Details  Name: Craig Knight MRN: [id number] Date of Birth: 12-Aug-1965  Beginning of progress report period: September 20, 2020 End of progress report period: September 26, 2020  Today's Date: 09/26/2020 OT Individual Time: 1000-1114 OT Individual Time Calculation (min): 74 min    Patient has met 2 of 4 short term goals. Pt making gradual progress towards OT goals d/t improvements in sitting balance, activity tolerance, strength and endurance. Pt currently requires MOD A for bathing from sink level, MOD A for UB dressing, MAX A for LB dressing, MAX A for toileting and MOD- MAX A for transfers.   Patient continues to demonstrate the following deficits: muscle weakness, decreased cardiorespiratoy endurance, impaired timing and sequencing, unbalanced muscle activation, and decreased coordination, decreased visual perceptual skills, decreased motor planning, and decreased sitting balance, decreased standing balance, decreased postural control, hemiplegia, and decreased balance strategies and therefore will continue to benefit from skilled OT intervention to enhance overall performance with BADL and Reduce care partner burden.  Patient progressing toward long term goals..  Continue plan of care.  OT Short Term Goals Week 2:  OT Short Term Goal 1 (Week 2): pt will complete sit<>stands with HW with MOD A OT Short Term Goal 2 (Week 2): pt will complete LB dressing with AD PRN with MOD A OT Short Term Goal 3 (Week 2): pt will use RUE consistently during ADLs as stabilizer during 2 consecutive OT sessions     Therapy Documentation Precautions:  Precautions Precautions: Fall, Other (comment) Precaution Comments: L hemianopsia; dense R hemiparesis; Zio Patch Restrictions Weight Bearing Restrictions: No    Therapy/Group: Individual Therapy  Corinne Ports [facility] 09/26/2020, 12:28 PM

## 2020-09-26 NOTE — Progress Notes (Signed)
PROGRESS NOTE   Subjective/Complaints:  No issues overnite , occ wheeze and SOB after therapy , used inhalers at home not aware that he had prn inhaler here   ROS- denies CP, - bowel or bladder issues, -SOB  Objective:   No results found. No results for input(s): WBC, HGB, HCT, PLT in the last 72 hours. No results for input(s): NA, K, CL, CO2, GLUCOSE, BUN, CREATININE, CALCIUM in the last 72 hours.  Intake/Output Summary (Last 24 hours) at 09/26/2020 [id number] Last data filed at 09/25/2020 1833 Gross per 24 hour  Intake 480 ml  Output 250 ml  Net 230 ml         Physical Exam: Vital Signs Blood pressure (!) 123/99, pulse 80, temperature 98.6 F (37 C), temperature source Oral, resp. rate 18, height '5\' 7"'$  (1.702 m), weight 96.1 kg, SpO2 98 %.    General: No acute distress Mood and affect are appropriate Heart: Regular rate and rhythm no rubs murmurs or extra sounds Lungs: Clear to auscultation, breathing unlabored, no rales or wheezes Abdomen: Positive bowel sounds, soft nontender to palpation, nondistended Extremities: No clubbing, cyanosis, or edema Skin: No evidence of breakdown, no evidence of rash  Neurologic: Cranial nerves II through XII intact, motor strength is 5/5 in Left and 0/5 right deltoid, bicep, tricep, grip, hip flexor, knee extensors, ankle dorsiflexor and plantar flexor Sensory exam normal sensation to light touch and proprioception in bilateral upper and lower extremities Tone- clonus RIght pronators  Musculoskeletal: Full range of motion in all 4 extremities. No joint swelling   Assessment/Plan: 1. Functional deficits which require 3+ hours per day of interdisciplinary therapy in a comprehensive inpatient rehab setting. Physiatrist is providing close team supervision and 24 hour management of active medical problems listed below. Physiatrist and rehab team continue to assess barriers to  discharge/monitor patient progress toward functional and medical goals  Care Tool:  Bathing    Body parts bathed by patient: Chest, Abdomen, Right arm, Front perineal area, Right upper leg, Left upper leg, Left lower leg, Face   Body parts bathed by helper: Left arm, Buttocks     Bathing assist Assist Level: Moderate Assistance - Patient 50 - 74%     Upper Body Dressing/Undressing Upper body dressing   What is the patient wearing?: Pull over shirt    Upper body assist Assist Level: Moderate Assistance - Patient 50 - 74%    Lower Body Dressing/Undressing Lower body dressing      What is the patient wearing?: Pants     Lower body assist Assist for lower body dressing: Maximal Assistance - Patient 25 - 49%     Toileting Toileting    Toileting assist Assist for toileting: 2 Helpers     Transfers Chair/bed transfer  Transfers assist     Chair/bed transfer assist level: Maximal Assistance - Patient 25 - 49%     Locomotion Ambulation   Ambulation assist   Ambulation activity did not occur: Safety/medical concerns  Assist level: 2 helpers Assistive device: Other (comment) (3 Musketeer) Max distance: 83f   Walk 10 feet activity   Assist  Walk 10 feet activity did not occur: Safety/medical concerns  Assist level: 2 helpers Pharmacist, hospital) Assistive device: Other (comment)   Walk 50 feet activity   Assist Walk 50 feet with 2 turns activity did not occur: Safety/medical concerns         Walk 150 feet activity   Assist Walk 150 feet activity did not occur: Safety/medical concerns         Walk 10 feet on uneven surface  activity   Assist Walk 10 feet on uneven surfaces activity did not occur: Safety/medical concerns         Wheelchair     Assist Will patient use wheelchair at discharge?:  (TBD) Type of Wheelchair: Manual    Wheelchair assist level: Supervision/Verbal cueing Max wheelchair distance: 60'    Wheelchair 50 feet  with 2 turns activity    Assist        Assist Level: Minimal Assistance - Patient > 75%   Wheelchair 150 feet activity     Assist          Blood pressure (!) 123/99, pulse 80, temperature 98.6 F (37 C), temperature source Oral, resp. rate 18, height '5\' 7"'$  (1.702 m), weight 96.1 kg, SpO2 98 %.    A/P  1.  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 Refused loop recorder and ZIO patch added. No UE or LE movement so far on RIght side              -patient may  shower              -ELOS/Goals: 14-21 days - min A to supervision- team conf in am   Continue CIR  2.  Antithrombotics:  -DVT/anticoagulation: Lovenox              -antiplatelet therapy: Aspirin 81 mg daily and Brilinta 90 mg twice daily for 30 days then back to aspirin and Plavix DAPT  3. Pain Management: Neurontin 300 mg nightly   4. Mood: Trazodone 100 mg nightly              -antipsychotic agents: N/A  5. Neuropsych: This patient is capable of making decisions on his own behalf.  6. Skin/Wound Care: Routine skin checks  7. Fluids/Electrolytes/Nutrition: Routine in and outs with follow-up chemistries  8.  Diabetes mellitus and peripheral neuropathy.  Hemoglobin A1c 7.4.  SSI.  Patient on Glucotrol 5 mg daily, Glucophage 500 mg twice daily prior to admission.  Resume as needed CBG (last 3)  Recent Labs    09/25/20 1607 09/25/20 2251 09/26/20 0558  GLUCAP 119* 134* 149*    Controlled 8/2   9.  CKD stage III.  Follow-up chemistries  10.  Hyperlipidemia.  Lipitor  11.  COPD/tobacco abuse. Pt states he quite smoking 5 wks PTA  Continue inhalers as directed.occ wheezing after therapy , advised pt to request prn inhaler   12.  Diastolic congestive heart failure.  No signs of fluid overload.  13.  Obesity.  BMI 35.25.  Dietary follow-up  14.  Incidental thyroid  nodule measuring 1.5 cm.  Follow-up outpatient thyroid ultrasound 8.  Diabetes mellitus and peripheral neuropathy.  Hemoglobin A1c 7.4.  SSI.  Patient on Glucotrol 5 mg daily, Glucophage 500 mg twice daily prior to admission.  Resume as needed  15.  Permissive hypertension.  Currently on Hygroton 25 mg daily.  Patient also had been on hydralazine 50 mg twice daily, Imdur 60 mg daily Cozaar 100  mg daily prior to admission.  Resume as needed Vitals:   09/25/20 1925 09/26/20 0513  BP: (!) 140/95 (!) 123/99  Pulse: 80 80  Resp:    Temp: 99.3 F (37.4 C) 98.6 F (37 C)  SpO2: 97% [id number]   [id number]: Diastolic elevated to [id number]. Given shortness of breath and significant elevation and CHF, will give 20 mg lasix 8/1: BP well controlled, continue current regimen.     LOS: 7 days A FACE TO FACE EVALUATION WAS PERFORMED  Charlett Blake 09/26/2020, 7:12 AM

## 2020-09-27 LAB — GLUCOSE, CAPILLARY
Glucose-Capillary: 112 mg/dL — ABNORMAL HIGH (ref 70–99)
Glucose-Capillary: 112 mg/dL — ABNORMAL HIGH (ref 70–99)
Glucose-Capillary: 170 mg/dL — ABNORMAL HIGH (ref 70–99)
Glucose-Capillary: 131 mg/dL — ABNORMAL HIGH (ref 70–99)

## 2020-09-27 NOTE — Progress Notes (Signed)
Speech Language Pathology Weekly Progress and Session Note  Patient Details  Name: Craig Knight MRN: [id number] Date of Birth: 07/19/1965  Beginning of progress report period: September 20, 2020 End of progress report period: September 27, 2020  Today's Date: 09/27/2020 SLP Individual Time: 1749-4496 SLP Individual Time Calculation (min): 45 min  Short Term Goals: Week 1: SLP Short Term Goal 1 (Week 1): Patient will demonstrate effective mastication and oral clearance with regular consistency PO trials prior to diet advancement. SLP Short Term Goal 2 (Week 1): Patient will demonstrate improved self monitoring and correction with use of speech intelligibility strategies at the sentence level with minA verbal cues. SLP Short Term Goal 2 - Progress (Week 1): Progressing toward goal SLP Short Term Goal 3 (Week 1): Patient will demonstrate functional problem solving and/or reasoning for mildly complex tasks with mod A verbal/visual cues. SLP Short Term Goal 3 - Progress (Week 1): Met SLP Short Term Goal 4 (Week 1): Patient will demonstrate recent recall (10 minutes or greater) of novel information with mod A verbal cues for use of compensatory strategies SLP Short Term Goal 4 - Progress (Week 1): Met  New Short Term Goals: Week 2: SLP Short Term Goal 1 (Week 2): Patient will demonstrate effective mastication and oral clearance with regular consistency PO trials prior to diet advancement. SLP Short Term Goal 2 (Week 2): Patient will demonstrate improved self monitoring and correction with use of speech intelligibility strategies at the sentence level with minA verbal cues. SLP Short Term Goal 3 (Week 2): Patient will demonstrate functional problem solving and/or reasoning for mildly complex tasks with min A verbal/visual cues. SLP Short Term Goal 4 (Week 2): Patient will demonstrate recent recall (10 minutes or greater) of novel information with min A verbal cues for use of compensatory  strategies  Weekly Progress Updates: Patient has met 2 of 4 short term goals this reporting period. Patient has made functional progress with ST over the past week. Patient is currently completing cognitive-linguistic tasks with min-to-mod A verbal and visual cues. Spouse endorsed patient is near baseline function apart from decreased short-term recall. Spouse managed patient's medications at baseline and can assist with money management as needed. He continues to exhibit moderately impaired motor speech function impacting speech intelligibility at the sentence level. Patient continues to require min-to-mod verbal reminders to implement speech intelligibility strategies and refers to visual on wall in room to help him recall strategies when reminded. When actively implementing strategies, patient can increase intelligibility to 75% intelligible at sentence level, otherwise he is most consistently perceived as ~50% intelligible without strategies. He is tolerating a Dys 3 diet and thin liquids with continued mildly prolonged mastication and oral residue and mod I for implementation of safe swallow precautions and strategies. Patient requires set-up assist for all meals. No overt s/sx of aspiration observed with both Dys 3 textures and thin liquids. Due to continued oral deficits, continue to recommend Dys 3 diet at this time with upcoming regular consistency trials as tolerated. Patient/family education is ongoing Recommend continued skilled ST intervention to maximize cognitive, speech, and swallow function and functional independence prior to discharge. Continue progression toward established LTGs set at mod I for swallowing, sup A for speech, and min A for cognition.   Intensity: Minumum of 1-2 x/day, 30 to 90 minutes Frequency: 3 to 5 out of 7 days Duration/Length of Stay: 21-23 Treatment/Interventions: Cognitive remediation/compensation;Environmental controls;Internal/external aids;Speech/Language  facilitation;Cueing hierarchy;Dysphagia/aspiration precaution training;Functional tasks;Patient/family education;Therapeutic Activities  Daily Session Skilled Therapeutic  Interventions: Patient agreeable to skilled ST intervention with focus on speech goals. SLP facilitated oral care with set-up A. Patient was able to recall speech intelligibility with sup A. Speech production was perceived as 50-75% intelligible at conversation level at baseline without emphasis on implementing motor speech strategies. Facilitated use of strategies including talk loud, slow, and over-articulation at sentence level with min-to-mod A verbal cues to reduce speech rate and pause between words which allowed him to achieve 75-90% intelligibility at sentence level. Patient exhibited improved self monitoring skills and repair of speech breakdowns this date. Patient was left in bed with alarm activated and immediate needs within reach at end of session. Continue per current plan of care.      General    Pain Pain Assessment Pain Scale: 0-10 Pain Score: 0-No pain  Therapy/Group: Individual Therapy  Patty Sermons, M.S., [place]   Kathalene Sporer T Yarelin Reichardt 09/27/2020, 11:00 AM

## 2020-09-27 NOTE — Progress Notes (Signed)
Physical Therapy Weekly Progress Note  Patient Details  Name: Craig Knight MRN: [id number] Date of Birth: 1965/11/23  Beginning of progress report period: September 20, 2020 End of progress report period: September 27, 2020  Today's Date: 09/27/2020 PT Individual Time: 0847-1000 PT Individual Time Calculation (min): 73 min   Patient has met 4 of 4 short term goals. Craig Knight is making steady progress with therapy at this time to increase independence with functional mobility. He is performing supine<>sit with mod assist, sit<>stands with mod assist, and bed<>chair squat pivot transfers with mod assist. He is participating in high intensity gait training, ambulating up to 69f via 3 Musketeer support with +2 mod assist for balance, but requires total assist for R LE gait mechanics facilitating hip/knee extension during stance and limb advancement during swing due to limited to no muscle activation in that LE (2-/5 quad activation felt in [place] unable to replicate in open chain). He will benefit from continued CIR level therapies to further progress mobility.   Patient continues to demonstrate the following deficits muscle weakness, muscle joint tightness, and muscle paralysis, decreased cardiorespiratoy endurance, impaired timing and sequencing, abnormal tone, unbalanced muscle activation, decreased coordination, and decreased motor planning, field cut, decreased attention, decreased awareness, decreased problem solving, decreased safety awareness, decreased memory, and delayed processing, and decreased sitting balance, decreased standing balance, decreased postural control, hemiplegia, and decreased balance strategies and therefore will continue to benefit from skilled PT intervention to increase functional independence with mobility.  Patient progressing toward long term goals..  Continue plan of care.  PT Short Term Goals Week 1:  PT Short Term Goal 1 (Week 1): Pt will perform supine<>sit with  mod assist PT Short Term Goal 1 - Progress (Week 1): Met PT Short Term Goal 2 (Week 1): Pt will perform sit<>stands with mod assist PT Short Term Goal 2 - Progress (Week 1): Met PT Short Term Goal 3 (Week 1): Pt will perform bed<>chair transfers with mod assist PT Short Term Goal 3 - Progress (Week 1): Met PT Short Term Goal 4 (Week 1): Pt will ambulate at least 368fusing LRAD with max assist of 1 and +2 for safety PT Short Term Goal 4 - Progress (Week 1): Met Week 2:  PT Short Term Goal 1 (Week 2): Pt will perform supine<>sit with min assist PT Short Term Goal 2 (Week 2): Pt will perform sit<>stands with min assist PT Short Term Goal 3 (Week 2): Pt will perform bed<>chair transfers with min assist PT Short Term Goal 4 (Week 2): Pt will ambulate at least 10039fith +2 mod assist  Skilled Therapeutic Interventions/Progress Updates:  Ambulation/gait training;Community reintegration;DME/adaptive equipment instruction;Neuromuscular re-education;Psychosocial support;Stair training;UE/LE Strength taining/ROM;Wheelchair propulsion/positioning;Balance/vestibular training;Discharge planning;Functional electrical stimulation;Pain management;Skin care/wound management;Therapeutic Activities;UE/LE Coordination activities;Cognitive remediation/compensation;Disease management/prevention;Functional mobility training;Patient/family education;Splinting/orthotics;Therapeutic Exercise;Visual/perceptual remediation/compensation    Pt received sitting EOB and agreeable to therapy session. Sitting EOB donned shorts, socks, and shoes with max/total assist for time management. Sit>stand EOB>no UE support with mod assist of 1 for balance and lifting to stand while facilitating increased R hip/knee extension. Standing with min/mod assist for balance while fastening shorts closed. R squat pivot transfer EOB>w/c with mod assist for lifting/pivoting hips - manual facilitation for R LE positioning and cuing/facilitation for  head/hips relationship.  Transported to/from gym in w/c for time management and energy conservation.  Donned R LE ankle DF assist ACE wrap. Gait training ~58f27fing L UE support around +2 assist's shoulders - +2 mod assist for balance -  total assist for R LE gait mechanics to provide hip/knee extension during stance along with facilitating neutral pelvic alignment (pt tends to keep R hip rotated posteriorly) and then total assist to advance R LE during swing.  Pt reporting some tenderness at R tibial tuberosity likely due to where therapist providing manual facilitation.  Transported to day room. Stepped on/off treadmill using L UE support on handle with mod assist of 1 for balance and +2 for safety - cuing for stepping up with L LE and requires total assist to bring R LE onto treadmill. Donned litegait harness.   Performed the following locomotor treadmill training trials with BUE support, R UE positioned on handle with ACE wrap to promote WBing, and using strongest black bungee cord at R hip to promote anterior pelvic translation/rotation (of note: pt unable to tolerate more than slight body weight support due to discomfort of sling): 1st: 47mn at 0.323m totaling 52 ft - requiring total assist for R LE management facilitating hip/knee extension during stance with R weight shift along with total assist to advance R LE during swing - pt has premature L step causing short R stance time and lack of ability to initiate R hip/knee extension 2nd: 49m109m3sec at 0.3mp449motaling 127ft82fontinues to require total assist for R LE management but pt demoing longer R stance time with initiation of hip/knee extension as well as increased L LE step length for reciprocal stepping pattern, continues to require total assist for R LE advancement during swing  Doffed harness and stepped off treadmill as described above.   Gait training 60ft 71fground with L UE support around +2s shoulders - +2 mod assist for balance - pt  demos significant improvement in R hip/knee extension activation during stance with increased R weight shift and longer stance time allowing increased L LE step length for reciprocal stepping pattern. Pt demonstrates significant improvement in R/L weight shift onto stance limbs throughout. 3rd person for w/c follow to allow longer distance and give pt opportunity to perform 1 90degree turn. At end of session, pt left sitting EOB with needs in reach and bed alarm on.   Therapy Documentation Precautions:  Precautions Precautions: Fall, Other (comment) Precaution Comments: L hemianopsia; dense R hemiparesis; Zio Patch Restrictions Weight Bearing Restrictions: No   Pain: Reports some R knee discomfort where therapist facilitating gait, repositioned hand placement and provided body weight support to help off load the joint.   Therapy/Group: Individual Therapy  Reyce Lubeck Tawana Scale DPT, NCS, CSRS 09/27/2020, 7:54 AM

## 2020-09-27 NOTE — Plan of Care (Signed)
  Problem: Consults Goal: RH STROKE PATIENT EDUCATION Description: See Patient Education module for education specifics  Outcome: Progressing   Problem: RH BOWEL ELIMINATION Goal: RH STG MANAGE BOWEL WITH ASSISTANCE Description: STG Manage Bowel with mod I Assistance. Outcome: Progressing Goal: RH STG MANAGE BOWEL W/MEDICATION W/ASSISTANCE Description: STG Manage Bowel with Medication with mod I  Assistance. Outcome: Progressing   Problem: RH SAFETY Goal: RH STG ADHERE TO SAFETY PRECAUTIONS W/ASSISTANCE/DEVICE Description: STG Adhere to Safety Precautions With cues/reminders Assistance/Device. Outcome: Progressing   Problem: RH PAIN MANAGEMENT Goal: RH STG PAIN MANAGED AT OR BELOW PT'S PAIN GOAL Description: At or below level 4 Outcome: Progressing   Problem: RH KNOWLEDGE DEFICIT Goal: RH STG INCREASE KNOWLEDGE OF DIABETES Description: Patient will be able to manage DM with medications, and dietary modifications using handouts and educational resources with cues/reminders Outcome: Progressing Goal: RH STG INCREASE KNOWLEDGE OF HYPERTENSION Description: Patient will be able to manage HTN with medications, and dietary modifications using handouts and educational resources with cues/reminders Outcome: Progressing Goal: RH STG INCREASE KNOWLEGDE OF HYPERLIPIDEMIA Description: Patient will be able to manage HLD with medications, and dietary modifications using handouts and educational resources with cues/reminders Outcome: Progressing Goal: RH STG INCREASE KNOWLEDGE OF STROKE PROPHYLAXIS Description: Patient will be able to manage secondary stroke risks with medications, and dietary modifications using handouts and educational resources with cues/reminders Outcome: Progressing   Problem: Education: Goal: Ability to demonstrate management of disease process will improve Description: Patient will be able to manage HF with medications, and dietary modifications using handouts and  educational resources with cues/reminders Outcome: Progressing Goal: Ability to verbalize understanding of medication therapies will improve Outcome: Progressing Goal: Individualized Educational Video(s) Outcome: Progressing   Problem: Activity: Goal: Capacity to carry out activities will improve Outcome: Progressing   Problem: Cardiac: Goal: Ability to achieve and maintain adequate cardiopulmonary perfusion will improve Outcome: Progressing   Problem: RH Vision Goal: RH LTG Vision (Specify) Outcome: Progressing

## 2020-09-27 NOTE — Progress Notes (Signed)
PROGRESS NOTE   Subjective/Complaints:  No issues overnite , discussed breathing meds   ROS- denies CP, - bowel or bladder issues, -SOB  Objective:   No results found. No results for input(s): WBC, HGB, HCT, PLT in the last 72 hours. No results for input(s): NA, K, CL, CO2, GLUCOSE, BUN, CREATININE, CALCIUM in the last 72 hours.  Intake/Output Summary (Last 24 hours) at 09/27/2020 0726 Last data filed at 09/26/2020 2000 Gross per 24 hour  Intake 595 ml  Output 700 ml  Net -105 ml         Physical Exam: Vital Signs Blood pressure (!) 148/100, pulse 79, temperature 98.1 F (36.7 C), resp. rate 18, height '5\' 7"'$  (1.702 m), weight 96.1 kg, SpO2 95 %.    General: No acute distress Mood and affect are appropriate Heart: Regular rate and rhythm no rubs murmurs or extra sounds Lungs: Clear to auscultation, breathing unlabored, no rales or wheezes Abdomen: Positive bowel sounds, soft nontender to palpation, nondistended Extremities: No clubbing, cyanosis, or edema Skin: No evidence of breakdown, no evidence of rash  Neurologic: Cranial nerves II through XII intact, motor strength is 5/5 in Left and 0/5 right deltoid, bicep, tricep, grip, hip flexor, knee extensors, ankle dorsiflexor and plantar flexor Sensory exam normal sensation to light touch and proprioception in bilateral upper and lower extremities Tone- clonus RIght pronators  Musculoskeletal: Full range of motion in all 4 extremities. No joint swelling   Assessment/Plan: 1. Functional deficits which require 3+ hours per day of interdisciplinary therapy in a comprehensive inpatient rehab setting. Physiatrist is providing close team supervision and 24 hour management of active medical problems listed below. Physiatrist and rehab team continue to assess barriers to discharge/monitor patient progress toward functional and medical goals  Care Tool:  Bathing    Body  parts bathed by patient: Chest, Abdomen, Right arm, Front perineal area, Right upper leg, Left upper leg, Left lower leg, Face   Body parts bathed by helper: Left arm, Buttocks     Bathing assist Assist Level: Moderate Assistance - Patient 50 - 74%     Upper Body Dressing/Undressing Upper body dressing   What is the patient wearing?: Pull over shirt    Upper body assist Assist Level: Moderate Assistance - Patient 50 - 74%    Lower Body Dressing/Undressing Lower body dressing      What is the patient wearing?: Pants     Lower body assist Assist for lower body dressing: Maximal Assistance - Patient 25 - 49%     Toileting Toileting    Toileting assist Assist for toileting: 2 Helpers     Transfers Chair/bed transfer  Transfers assist     Chair/bed transfer assist level: Moderate Assistance - Patient 50 - 74% (squat pivot)     Locomotion Ambulation   Ambulation assist   Ambulation activity did not occur: Safety/medical concerns  Assist level: 2 helpers (+2 mod assist) Assistive device: Other (comment) (3 Musketeer) Max distance: 55f   Walk 10 feet activity   Assist  Walk 10 feet activity did not occur: Safety/medical concerns  Assist level: 2 helpers (+2 mod assist) Assistive device: Other (comment) (3 Musketeer)  Walk 50 feet activity   Assist Walk 50 feet with 2 turns activity did not occur:  (haven't progressed to turns)         Walk 150 feet activity   Assist Walk 150 feet activity did not occur: Safety/medical concerns         Walk 10 feet on uneven surface  activity   Assist Walk 10 feet on uneven surfaces activity did not occur: Safety/medical concerns         Wheelchair     Assist Will patient use wheelchair at discharge?:  (TBD) Type of Wheelchair: Manual    Wheelchair assist level: Supervision/Verbal cueing Max wheelchair distance: 60'    Wheelchair 50 feet with 2 turns activity    Assist         Assist Level: Minimal Assistance - Patient > 75%   Wheelchair 150 feet activity     Assist          Blood pressure (!) 148/100, pulse 79, temperature 98.1 F (36.7 C), resp. rate 18, height '5\' 7"'$  (1.702 m), weight 96.1 kg, SpO2 95 %.    A/P  1.  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 Refused loop recorder and ZIO patch added. No UE or LE movement so far on RIght side              -patient may  shower              -ELOS/Goals: 14-21 days - min A to supervision- team conf in am   Continue CIR  2.  Antithrombotics:  -DVT/anticoagulation: Lovenox              -antiplatelet therapy: Aspirin 81 mg daily and Brilinta 90 mg twice daily for 30 days then back to aspirin and Plavix DAPT  3. Pain Management: Neurontin 300 mg nightly   4. Mood: Trazodone 100 mg nightly              -antipsychotic agents: N/A  5. Neuropsych: This patient is capable of making decisions on his own behalf.  6. Skin/Wound Care: Routine skin checks  7. Fluids/Electrolytes/Nutrition: Routine in and outs with follow-up chemistries  8.  Diabetes mellitus and peripheral neuropathy.  Hemoglobin A1c 7.4.  SSI.  Patient on Glucotrol 5 mg daily, Glucophage 500 mg twice daily prior to admission.  Resume as needed CBG (last 3)  Recent Labs    09/26/20 1734 09/26/20 2107 09/27/20 0541  GLUCAP 133* 144* 112*    Controlled 8/3 off oral hypoglycemics   9.  CKD stage III.  Follow-up chemistries  10.  Hyperlipidemia.  Lipitor  11.  COPD/tobacco abuse. Pt states he quite smoking 5 wks PTA  Continue inhalers as directed.occ wheezing after therapy , advised pt to request prn inhaler   12.  Diastolic congestive heart failure.  No signs of fluid overload.  13.  Obesity.  BMI 35.25.  Dietary follow-up  14.  Incidental thyroid nodule measuring 1.5 cm.  Follow-up outpatient  thyroid ultrasound 8.  Diabetes mellitus and peripheral neuropathy.  Hemoglobin A1c 7.4.  SSI.  Patient on Glucotrol 5 mg daily, Glucophage 500 mg twice daily prior to admission.  Resume as needed  15.  Permissive hypertension.  Currently on Hygroton 25 mg daily.  Patient also had been on hydralazine 50 mg twice daily, Imdur 60 mg daily Cozaar 100 mg daily prior to admission.  Resume as needed  Vitals:   09/26/20 1920 09/27/20 0520  BP: (!) 112/91 (!) 148/100  Pulse: 96 79  Resp:  18  Temp: 97.8 F (36.6 C) 98.1 F (36.7 C)  SpO2: 98% [id number]   [id number]: Diastolic elevated to [id number]. Given shortness of breath and significant elevation and CHF, will give 20 mg lasix 8/3- elevated systolic  only on chlorthalidone     LOS: 8 days A FACE TO FACE EVALUATION WAS PERFORMED  Charlett Blake 09/27/2020, 7:26 AM

## 2020-09-27 NOTE — Patient Care Conference (Signed)
Inpatient RehabilitationTeam Conference and Plan of Care Update Date: 09/27/2020   Time: 10:41 AM    Patient Name: Craig Knight      Medical Record Number: [id number]  Date of Birth: September 15, 1965 Sex: Male         Room/Bed: 4M05C/4M05C-01 Payor Info: Payor: Marine scientist / Plan: [employer] MEDICARE / Product Type: *No Product type* /    Admit Date/Time:  09/19/2020  3:19 PM  Primary Diagnosis:  Infarction of left basal ganglia [facility])  Hospital Problems: Principal Problem:   Infarction of left basal ganglia (HCC) Active Problems:   DM type 2 (diabetes mellitus, type 2) ([place])   CKD (chronic kidney disease), stage III [facility])    Expected Discharge Date: Expected Discharge Date: 10/14/20  Team Members Present: Physician leading conference: Dr. Alysia Penna Social Worker Present: [name], BSW Nurse Present: Dorien Chihuahua, RN PT Present: Page Spiro, PT OT Present: Leretha Pol, OT SLP Present: Sherren Kerns, SLP PPS Coordinator present : Gunnar Fusi, SLP     Current Status/Progress Goal Weekly Team Focus  Bowel/Bladder   Continent of Bowel and Bladder, LBM 09/26/20  Remain Continent.      Swallow/Nutrition/ Hydration   Dys 3, sup A with intermittent reminders for lingual sweep in right buccal cavity  Mod I, Regular diet, thin liquids  Use of swallow strategies, lingual sweep   ADL's   MOD- MAX A for bathing, MOD- MAX A for UB dressing and MAX A for LB dressing. 2 helpers for toileting and MAX A for toilet transfers. (Simultaneous filing. User may not have seen previous data.)  MIN - supervision goals (Simultaneous filing. User may not have seen previous data.)  compensatory strategies on using RUE as gross assist, activity tolerance, transfers, hemi dressing techniques, education (Simultaneous filing. User may not have seen previous data.)   Mobility   mod assist bed mobility, mod assist sit<>stands, mod assist squat pivot transfers, gait up to 41f via 3  Musketeer +2 mod assist requiring max assist for R LE gait mechanics due to inability to advance limb during swing though does show some abilty to stabilize during stance  min/mod assist overall at ambulatory level  activity tolerance, R LE NMR, transfer training, gait training, bed mobility training, dynamic sitting and standing balance, pt education   Communication   min-to-mod A  Sup A  Speech intelligibility strategies   Safety/Cognition/ Behavioral Observations  Min-to-mod A  Min A (baseline deficits per spouse)  problem solving, recall, use of memory strategies   Pain   Denies Pain  Remain Pain Free.      Skin   No Impairment Noted.  Maintain Skin Integrity.        Discharge Planning:  Discharging home with spouse   Team Discussion: BP improved on less meds than previous note. CBGs controlled with diet; Insulin and metformin required PTA. Complains of shortness of breath and need for inhaler; however, workup negative, O2 sats 97-100% on RA and CXR WNL. Also note right side hamstring pain/tone; Botox option reviewed per MD.  Patient on target to meet rehab goals: Currently requires mod - max assist for bathing and max assist for lower body dressing and toileting. Requires mod assist for sit - stand and squat pivot transfers. Discharge goals set for supervision and mi - mod assist for ambulation.  *See Care Plan and progress notes for long and short-term goals.   Revisions to Treatment Plan:  Working on compensatory strategies for right UE and hemi-dressing techniques  Also practice on treadmill and on reciprocal stepping pattern   Teaching Needs: Safety, hemi-dressing techniques, transfers, toileting, medications, secondary stroke risk management, etc  Current Barriers to Discharge: Decreased caregiver support, Home enviroment access/layout, and Behavior  Possible Resolutions to Barriers: Family education Recommend rails for entry or ramp installation     Medical  Summary Current Status: HTN improving, Right hip pain improving, no motor recovery thus far, tone increasing  Barriers to Discharge: Medical stability   Possible Resolutions to Barriers/Weekly Focus: need for increased spasticity management   Continued Need for Acute Rehabilitation Level of Care: The patient requires daily medical management by a physician with specialized training in physical medicine and rehabilitation for the following reasons: Direction of a multidisciplinary physical rehabilitation program to maximize functional independence : Yes Medical management of patient stability for increased activity during participation in an intensive rehabilitation regime.: Yes Analysis of laboratory values and/or radiology reports with any subsequent need for medication adjustment and/or medical intervention. : Yes   I attest that I was present, lead the team conference, and concur with the assessment and plan of the team.   Dorien Chihuahua B 09/27/2020, 11:25 AM

## 2020-09-27 NOTE — Progress Notes (Signed)
Occupational Therapy Session Note  Patient Details  Name: Craig Knight MRN: [id number] Date of Birth: 1965/07/20  Today's Date: 09/27/2020 OT Individual Time: [id number] OT Individual Time Calculation (min): 53 min    Short Term Goals: Week 2:  OT Short Term Goal 1 (Week 2): pt will complete sit<>stands with HW with MOD A OT Short Term Goal 2 (Week 2): pt will complete LB dressing with AD PRN with MOD A OT Short Term Goal 3 (Week 2): pt will use RUE consistently during ADLs as stabilizer during 2 consecutive OT sessions  Skilled Therapeutic Interventions/Progress Updates:    Pt greeted EOB with NT present reporting that pt had just finished toileting with urinal. Pt currently reqires MAX A to don pants from EOB via sit<>stand to pull pants up to waist line and MOD A for squat pivot transfer from EOB>w/c going towards pt R side. Pt completed oral care at sink with supervision with good carryover of incorporating RUE into grooming tasks and compensatory methods during grooming tasks. Remainder of session to focus on transfer training to toilet with pt needing MAX A to complete squat pivot to drop arm 3n1 from w/c. Pt reports he does not have grab bars at home but reports his w/c would be able to fit into BR. Initiated education on Customer service manager for LB dressing from EOB, pt receptive to education and reports he does plan to get reacher for home however pt would likely still need assist threading RLE into pants. Pt left EOB with bed alarm activated with all needs within reach.   Therapy Documentation Precautions:  Precautions Precautions: Fall, Other (comment) Precaution Comments: L hemianopsia; dense R hemiparesis; Zio Patch Restrictions Weight Bearing Restrictions: No General:   Vital Signs:  Pain: Pt reports no pain during session.    Therapy/Group: Individual Therapy  Precious Haws 09/27/2020, 12:25 PM

## 2020-09-27 NOTE — Progress Notes (Signed)
Patient ID: Craig Knight, male   DOB: 1965-03-01, 55 y.o.   MRN: [id number] Team Conference Report to Patient/Family  Team Conference discussion was reviewed with the patient and caregiver, including goals, any changes in plan of care and target discharge date.  Patient and caregiver express understanding and are in agreement.  The patient has a target discharge date of 10/14/20.  SW met with patient, called spouse at bedside- no answer, left VM. Provided patient with updates. Patient complaining on stomach pain. Sw informed patient of the possible need of ramp, patient reports he is having handrails built, since there is only 2 steps. Patient questing is spouse is able to bring in foods he like. No additional questions or concerns, sw will continue to follow up  Dyanne Iha 09/27/2020, 1:54 PM

## 2020-09-28 ENCOUNTER — Ambulatory Visit: Payer: Medicare Other | Admitting: Physical Therapy

## 2020-09-28 ENCOUNTER — Ambulatory Visit: Payer: Medicare Other | Attending: Student | Admitting: Occupational Therapy

## 2020-09-28 DIAGNOSIS — R2681 Unsteadiness on feet: Secondary | ICD-10-CM | POA: Insufficient documentation

## 2020-09-28 DIAGNOSIS — M6281 Muscle weakness (generalized): Secondary | ICD-10-CM | POA: Insufficient documentation

## 2020-09-28 DIAGNOSIS — I69351 Hemiplegia and hemiparesis following cerebral infarction affecting right dominant side: Secondary | ICD-10-CM | POA: Insufficient documentation

## 2020-09-28 LAB — GLUCOSE, CAPILLARY
Glucose-Capillary: 124 mg/dL — ABNORMAL HIGH (ref 70–99)
Glucose-Capillary: 109 mg/dL — ABNORMAL HIGH (ref 70–99)
Glucose-Capillary: 120 mg/dL — ABNORMAL HIGH (ref 70–99)
Glucose-Capillary: 112 mg/dL — ABNORMAL HIGH (ref 70–99)

## 2020-09-28 NOTE — Progress Notes (Signed)
Occupational Therapy Session Note  Patient Details  Name: Craig Knight MRN: [id number] Date of Birth: 1965-07-26  Today's Date: 09/28/2020 OT Individual Time: [id number] OT Individual Time Calculation (min): 54 min    Short Term Goals: Week 2:  OT Short Term Goal 1 (Week 2): pt will complete sit<>stands with HW with MOD A OT Short Term Goal 2 (Week 2): pt will complete LB dressing with AD PRN with MOD A OT Short Term Goal 3 (Week 2): pt will use RUE consistently during ADLs as stabilizer during 2 consecutive OT sessions  Skilled Therapeutic Interventions/Progress Updates:   Pt greeted supine in bed reporting feeling like he was "losing his voice" but agreeable to OT session. MIN A for bed mobility with pt exiting to L side of bed, MOD A for squat pivot transfer from EOB>w/c going to pts R side. Pt transported to sink for bathing, set- up assist needed to wash pts face and UB, MOD A overall for bathing from sink level, MIN A for sit<>stand from w/c for LB bathing. MIN A for UB dressing, MAX for LB dressing for underwear and shorts, MAX A to don footwear, pt using hemi techniques at sink for grooming tasks, cues needed to incorporate RUE into grooming tasks as gross assist. Hand over hand assist to open deodorant and apply to pts L underarm. Pt left seated in w/c with safety belt activated and all needs within reach. Spoke with pts wife at end of session to bring pt sweatpants/ shorts to work on dressing as pt with difficulty donning golf shorts with a buttons.   Therapy Documentation Precautions:  Precautions Precautions: Fall, Other (comment) Precaution Comments: L hemianopsia; dense R hemiparesis; Zio Patch Restrictions Weight Bearing Restrictions: No General:   Vital Signs: Therapy Vitals Temp: 98.6 F (37 C) Pulse Rate: 68 Resp: 18 BP: 124/88 Patient Position (if appropriate): Lying Oxygen Therapy SpO2: 94 % O2 Device: Room Air Pain: Pt reports no pain during session     Therapy/Group: Individual Therapy  Corinne Ports [facility] 09/28/2020, 8:59 AM

## 2020-09-28 NOTE — Progress Notes (Signed)
Physical Therapy Session Note  Patient Details  Name: Craig Knight MRN: [id number] Date of Birth: September 28, 1965  Today's Date: 09/28/2020 PT Individual Time: 0952-1100 PT Individual Time Calculation (min): 68 min   Short Term Goals: Week 2:  PT Short Term Goal 1 (Week 2): Pt will perform supine<>sit with min assist PT Short Term Goal 2 (Week 2): Pt will perform sit<>stands with min assist PT Short Term Goal 3 (Week 2): Pt will perform bed<>chair transfers with min assist PT Short Term Goal 4 (Week 2): Pt will ambulate at least 145f with +2 mod assist  Skilled Therapeutic Interventions/Progress Updates:    Pt received sitting in recliner and agreeable to therapy session. Pt appears fatigued throughout session. Donned socks and shoes total assist (still unable to actively lift R LE to assist with this task). L squat pivot recliner>w/c mod assist for pivoting hips - manual facilitation for R LE positioning during transfer, cuing for sequencing and head/hips relationship.  Transported to/from gym in w/c for time management and energy conservation.   Sit<>stands with light mod assist of 1 during session with pt demonstrating significant improvement in R hip/knee extension activation and improving midline orientation.   Donned DF assist ACE wrap. Gait training ~646fL UE support around +2's shoulders - +2 mod assist for balance and therapist providing max/total assist R LE management - requires total assist to advance R LE during swing, cuing/facilitation to weight shift fully to L - requires max/total assist for R hip/knee extension and anterior pelvic translation during stance, continued cuing to maintain upright trunk, hip extended posturing.  Standing R LE NMR targeting stance control for improved pelvic alignment (tends to rotate posteriorly compared to L hip) and increased hip/knee extension via repeated L LE foot taps on/off 2" step - mirror feedback and +2 providing very light L UE support -  mod/max assist of 1 for balance and manually facilitating R LE positioning, with increased repetition and multimodal cuing pt demos improving motor planning and increased R LE extended position.  Gait training ~6030fx with same set-up and assist as above; however, pt now demonstrating increasing R hip/knee extension and anterior pelvic translation during R stance phase resulting in increasing L LE step length for reciprocal stepping pattern - continues to require cuing to slow down and focus on extending R LE prior to initiating L swing phase.   Transported back to room. L squat pivot w/c>EOB mod assist for pivoting hips as described above. Sit>supine mod assist for R hemibody management. Supine bridging 2x10reps with focus on increased R glute activation for symmetrical hip clearance. Supine>sitting L EOB, mod assist as above. Pt left seated EOB with needs in reach and bed alarm on.  Therapy Documentation Precautions:  Precautions Precautions: Fall, Other (comment) Precaution Comments: L hemianopsia; dense R hemiparesis; Zio Patch Restrictions Weight Bearing Restrictions: No   Pain:  No reports of pain but pt does state he noticed bruising along R lateral malleolus - therapist saw bruising but no intervention needed at this time - ensured wearing R ankle DF assist ACE wrap to protect ankle throughout therapy session.    Therapy/Group: Individual Therapy  CarTawana ScalePT, DPT, NCS, CSRS 09/28/2020, 7:54 AM

## 2020-09-28 NOTE — Progress Notes (Signed)
Speech Language Pathology Daily Session Note  Patient Details  Name: Craig Knight MRN: [id number] Date of Birth: 07-Jun-1965  Today's Date: 09/28/2020 SLP Individual Time: 1410-1450 SLP Individual Time Calculation (min): 40 min  Short Term Goals: Week 2: SLP Short Term Goal 1 (Week 2): Patient will demonstrate effective mastication and oral clearance with regular consistency PO trials prior to diet advancement. SLP Short Term Goal 2 (Week 2): Patient will demonstrate improved self monitoring and correction with use of speech intelligibility strategies at the sentence level with minA verbal cues. SLP Short Term Goal 3 (Week 2): Patient will demonstrate functional problem solving and/or reasoning for mildly complex tasks with min A verbal/visual cues. SLP Short Term Goal 4 (Week 2): Patient will demonstrate recent recall (10 minutes or greater) of novel information with min A verbal cues for use of compensatory strategies  Skilled Therapeutic Interventions: Patient agreeable to skilled ST intervention with focus on cognitive goals. Patient was accompanied by his spouse on this date who reported noticeable improvement with speech intelligibility. Patient was perceived as 75% intelligible at conversation level and was observed talking on the phone where he was perceived as 75-90% with additional emphasis on speech intelligibility strategies. SLP facilitated sustained attention, problem, solving, and verbal reasoning with novel card game "blink". Patient completed with sup A verbal/visual cues and additional processing time. He demonstrated good carry over and recall of instructions throughout session. Patient was left in bed with alarm activated and immediate needs within reach at end of session. Continue per current plan of care.     Pain Pain Assessment Pain Scale: 0-10 Pain Score: 0-No pain  Therapy/Group: Individual Therapy  Patty Sermons 09/28/2020, 2:45 PM

## 2020-09-28 NOTE — Progress Notes (Signed)
PROGRESS NOTE   Subjective/Complaints:  Pt states he has not had BM x 2 days. No abd pain  ROS- denies CP, - bowel or bladder issues, -SOB  Objective:   No results found. No results for input(s): WBC, HGB, HCT, PLT in the last 72 hours. No results for input(s): NA, K, CL, CO2, GLUCOSE, BUN, CREATININE, CALCIUM in the last 72 hours.  Intake/Output Summary (Last 24 hours) at 09/28/2020 0737 Last data filed at 09/28/2020 0513 Gross per 24 hour  Intake 720 ml  Output 850 ml  Net -130 ml         Physical Exam: Vital Signs Blood pressure 124/88, pulse 68, temperature 98.6 F (37 C), resp. rate 18, height '5\' 7"'$  (1.702 m), weight 96.1 kg, SpO2 94 %.  General: No acute distress Mood and affect are appropriate Heart: Regular rate and rhythm no rubs murmurs or extra sounds Lungs: Clear to auscultation, breathing unlabored, no rales or wheezes Abdomen: Positive bowel sounds, soft nontender to palpation, nondistended Extremities: No clubbing, cyanosis, or edema Skin: No evidence of breakdown, no evidence of rash  Neurologic: Cranial nerves II through XII intact, motor strength is 5/5 in Left and 0/5 right deltoid, bicep, tricep, grip, hip flexor, knee extensors, ankle dorsiflexor and plantar flexor Sensory exam normal sensation to light touch and proprioception in bilateral upper and lower extremities Tone- clonus RIght pronators  Musculoskeletal: Full range of motion in all 4 extremities. No joint swelling   Assessment/Plan: 1. Functional deficits which require 3+ hours per day of interdisciplinary therapy in a comprehensive inpatient rehab setting. Physiatrist is providing close team supervision and 24 hour management of active medical problems listed below. Physiatrist and rehab team continue to assess barriers to discharge/monitor patient progress toward functional and medical goals  Care Tool:  Bathing    Body parts  bathed by patient: Chest, Abdomen, Right arm, Front perineal area, Right upper leg, Left upper leg, Left lower leg, Face   Body parts bathed by helper: Left arm, Buttocks     Bathing assist Assist Level: Moderate Assistance - Patient 50 - 74%     Upper Body Dressing/Undressing Upper body dressing   What is the patient wearing?: Pull over shirt    Upper body assist Assist Level: Moderate Assistance - Patient 50 - 74%    Lower Body Dressing/Undressing Lower body dressing      What is the patient wearing?: Pants     Lower body assist Assist for lower body dressing: Maximal Assistance - Patient 25 - 49%     Toileting Toileting    Toileting assist Assist for toileting: Maximal Assistance - Patient 25 - 49%     Transfers Chair/bed transfer  Transfers assist     Chair/bed transfer assist level: Moderate Assistance - Patient 50 - 74% (squat pivot)     Locomotion Ambulation   Ambulation assist   Ambulation activity did not occur: Safety/medical concerns  Assist level: 2 helpers (+2 mod A) Assistive device: Other (comment) (3 Musketeer) Max distance: 55f   Walk 10 feet activity   Assist  Walk 10 feet activity did not occur: Safety/medical concerns  Assist level: 2 helpers (+2 mod A)  Assistive device: Other (comment) (3 Musketeer)   Walk 50 feet activity   Assist Walk 50 feet with 2 turns activity did not occur:  (haven't progressed to turns)         Walk 150 feet activity   Assist Walk 150 feet activity did not occur: Safety/medical concerns         Walk 10 feet on uneven surface  activity   Assist Walk 10 feet on uneven surfaces activity did not occur: Safety/medical concerns         Wheelchair     Assist Will patient use wheelchair at discharge?:  (TBD) Type of Wheelchair: Manual    Wheelchair assist level: Supervision/Verbal cueing Max wheelchair distance: 44'    Wheelchair 50 feet with 2 turns activity    Assist         Assist Level: Minimal Assistance - Patient > 75%   Wheelchair 150 feet activity     Assist          Blood pressure 124/88, pulse 68, temperature 98.6 F (37 C), resp. rate 18, height '5\' 7"'$  (1.702 m), weight 96.1 kg, SpO2 94 %.    A/P  1.  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 Refused loop recorder and ZIO patch added. No UE or LE movement so far on RIght side              -patient may  shower              -ELOS/Goals: 14-21 days - min A to supervision- team conf in am   Continue CIR  2.  Antithrombotics:  -DVT/anticoagulation: Lovenox              -antiplatelet therapy: Aspirin 81 mg daily and Brilinta 90 mg twice daily for 30 days then back to aspirin and Plavix DAPT  3. Pain Management: Neurontin 300 mg nightly   4. Mood: Trazodone 100 mg nightly              -antipsychotic agents: N/A  5. Neuropsych: This patient is capable of making decisions on his own behalf.  6. Skin/Wound Care: Routine skin checks  7. Fluids/Electrolytes/Nutrition: Routine in and outs with follow-up chemistries  8.  Diabetes mellitus and peripheral neuropathy.  Hemoglobin A1c 7.4.  SSI.  Patient on Glucotrol 5 mg daily, Glucophage 500 mg twice daily prior to admission.  Resume as needed CBG (last 3)  Recent Labs    09/27/20 1641 09/27/20 2056 09/28/20 0542  GLUCAP 170* 131* 124*    Controlled 8/4 off oral hypoglycemics   9.  CKD stage III.  Follow-up chemistries  10.  Hyperlipidemia.  Lipitor  11.  COPD/tobacco abuse. Pt states he quite smoking 5 wks PTA  Continue inhalers as directed.occ wheezing after therapy , advised pt to request prn inhaler   12.  Diastolic congestive heart failure.  No signs of fluid overload.  13.  Obesity.  BMI 35.25.  Dietary follow-up  14.  Incidental thyroid nodule measuring 1.5 cm.  Follow-up outpatient  thyroid ultrasound 8.  Diabetes mellitus and peripheral neuropathy.  Hemoglobin A1c 7.4.  SSI.  Patient on Glucotrol 5 mg daily, Glucophage 500 mg twice daily prior to admission.  Resume as needed  15.  Permissive hypertension.  Currently on Hygroton 25 mg daily.  Patient also had been on hydralazine 50 mg twice daily, Imdur 60 mg daily Cozaar 100 mg daily  prior to admission.  Resume as needed Vitals:   09/28/20 0502 09/28/20 0513  BP: 121/85 124/88  Pulse: 73 68  Resp: 18 18  Temp: 97.9 F (36.6 C) 98.6 F (37 C)  SpO2: 95% [id number]   [id number]: Diastolic elevated to Q000111Q. Given shortness of breath and significant elevation and CHF, will give 20 mg lasix 8/4 controlled on chlorthalidone   16.  Pt c/o no BM x 2 d but nsg recorded cont BM yesterday     LOS: 9 days A FACE TO FACE EVALUATION WAS PERFORMED  Charlett Blake 09/28/2020, 7:37 AM

## 2020-09-29 LAB — GLUCOSE, CAPILLARY
Glucose-Capillary: 147 mg/dL — ABNORMAL HIGH (ref 70–99)
Glucose-Capillary: 132 mg/dL — ABNORMAL HIGH (ref 70–99)
Glucose-Capillary: 126 mg/dL — ABNORMAL HIGH (ref 70–99)
Glucose-Capillary: 104 mg/dL — ABNORMAL HIGH (ref 70–99)

## 2020-09-29 NOTE — Progress Notes (Signed)
PROGRESS NOTE   Subjective/Complaints:  Working with OT, no new C/os, pt occ c/o SOB, has prn inhalers   ROS- denies CP, - bowel or bladder issues, -SOB  Objective:   No results found. No results for input(s): WBC, HGB, HCT, PLT in the last 72 hours. No results for input(s): NA, K, CL, CO2, GLUCOSE, BUN, CREATININE, CALCIUM in the last 72 hours.  Intake/Output Summary (Last 24 hours) at 09/29/2020 0805 Last data filed at 09/29/2020 0740 Gross per 24 hour  Intake 576 ml  Output 225 ml  Net 351 ml         Physical Exam: Vital Signs Blood pressure (!) 123/94, pulse 65, temperature 97.7 F (36.5 C), temperature source Oral, resp. rate 18, height '5\' 7"'$  (1.702 m), weight 96.1 kg, SpO2 98 %.   General: No acute distress Mood and affect are appropriate Heart: Regular rate and rhythm no rubs murmurs or extra sounds Lungs: Clear to auscultation, breathing unlabored, no rales or wheezes Abdomen: Positive bowel sounds, soft nontender to palpation, nondistended Extremities: No clubbing, cyanosis, or edema Skin: No evidence of breakdown, no evidence of rash    Neurologic: Cranial nerves II through XII intact, motor strength is 5/5 in Left and 0/5 right deltoid, bicep, tricep, grip, hip flexor, knee extensors, ankle dorsiflexor and plantar flexor Sensory exam normal sensation to light touch and proprioception in bilateral upper and lower extremities Tone- clonus RIght pronators  Musculoskeletal: Full range of motion in all 4 extremities. No joint swelling   Assessment/Plan: 1. Functional deficits which require 3+ hours per day of interdisciplinary therapy in a comprehensive inpatient rehab setting. Physiatrist is providing close team supervision and 24 hour management of active medical problems listed below. Physiatrist and rehab team continue to assess barriers to discharge/monitor patient progress toward functional and medical  goals  Care Tool:  Bathing    Body parts bathed by patient: Right arm, Chest, Abdomen, Front perineal area, Buttocks, Right upper leg, Left upper leg, Face   Body parts bathed by helper: Left arm     Bathing assist Assist Level: Moderate Assistance - Patient 50 - 74%     Upper Body Dressing/Undressing Upper body dressing   What is the patient wearing?: Pull over shirt    Upper body assist Assist Level: Minimal Assistance - Patient > 75%    Lower Body Dressing/Undressing Lower body dressing      What is the patient wearing?: Pants, Underwear/pull up     Lower body assist Assist for lower body dressing: Maximal Assistance - Patient 25 - 49%     Toileting Toileting    Toileting assist Assist for toileting: Maximal Assistance - Patient 25 - 49%     Transfers Chair/bed transfer  Transfers assist     Chair/bed transfer assist level: Moderate Assistance - Patient 50 - 74% (squat pivot)     Locomotion Ambulation   Ambulation assist   Ambulation activity did not occur: Safety/medical concerns  Assist level: 2 helpers (+2 mod A) Assistive device: Other (comment) (3 Musketeer) Max distance: 31f   Walk 10 feet activity   Assist  Walk 10 feet activity did not occur: Safety/medical concerns  Assist  level: 2 helpers (+2 mod A) Assistive device: Other (comment) (3 Musketeer)   Walk 50 feet activity   Assist Walk 50 feet with 2 turns activity did not occur:  (haven't progressed to turns)         Walk 150 feet activity   Assist Walk 150 feet activity did not occur: Safety/medical concerns         Walk 10 feet on uneven surface  activity   Assist Walk 10 feet on uneven surfaces activity did not occur: Safety/medical concerns         Wheelchair     Assist Will patient use wheelchair at discharge?:  (TBD) Type of Wheelchair: Manual    Wheelchair assist level: Supervision/Verbal cueing Max wheelchair distance: 36'    Wheelchair 50  feet with 2 turns activity    Assist        Assist Level: Minimal Assistance - Patient > 75%   Wheelchair 150 feet activity     Assist          Blood pressure (!) 123/94, pulse 65, temperature 97.7 F (36.5 C), temperature source Oral, resp. rate 18, height '5\' 7"'$  (1.702 m), weight 96.1 kg, SpO2 98 %.    A/P  1.  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 Refused loop recorder and ZIO patch added. No UE or LE movement so far on RIght side              -patient may  shower              -ELOS/Goals: 14-21 days - min A to supervision- team conf in am   Continue CIR  2.  Antithrombotics:  -DVT/anticoagulation: Lovenox              -antiplatelet therapy: Aspirin 81 mg daily and Brilinta 90 mg twice daily for 30 days then back to aspirin and Plavix DAPT  3. Pain Management: Neurontin 300 mg nightly   4. Mood: Trazodone 100 mg nightly              -antipsychotic agents: N/A  5. Neuropsych: This patient is capable of making decisions on his own behalf.  6. Skin/Wound Care: Routine skin checks  7. Fluids/Electrolytes/Nutrition: Routine in and outs with follow-up chemistries  8.  Diabetes mellitus and peripheral neuropathy.  Hemoglobin A1c 7.4.  SSI.  Patient on Glucotrol 5 mg daily, Glucophage 500 mg twice daily prior to admission.  Resume as needed CBG (last 3)  Recent Labs    09/28/20 1621 09/28/20 2103 09/29/20 0538  GLUCAP 120* 112* 147*    Controlled 8/4 off oral hypoglycemics   9.  CKD stage III.  Follow-up chemistries  10.  Hyperlipidemia.  Lipitor  11.  COPD/tobacco abuse. Pt states he quite smoking 5 wks PTA  Continue inhalers as directed.occ wheezing after therapy , advised pt to request prn inhaler   12.  Diastolic congestive heart failure.  No signs of fluid overload.  13.  Obesity.  BMI 35.25.  Dietary  follow-up  14.  Incidental thyroid nodule measuring 1.5 cm.  Follow-up outpatient thyroid ultrasound 8.  Diabetes mellitus and peripheral neuropathy.  Hemoglobin A1c 7.4.  SSI.  Patient on Glucotrol 5 mg daily, Glucophage 500 mg twice daily prior to admission.  Resume as needed  15.  Permissive hypertension.  Currently on Hygroton 25 mg daily.  Patient also had been on hydralazine 50 mg  twice daily, Imdur 60 mg daily Cozaar 100 mg daily prior to admission.  Resume as needed Vitals:   09/28/20 1940 09/29/20 0315  BP: (!) 117/102 (!) 123/94  Pulse: 81 65  Resp: 18 18  Temp: 98.6 F (37 C) 97.7 F (36.5 C)  SpO2: 100% [id number]   [id number]: Diastolic elevated to Q000111Q. Given shortness of breath and significant elevation and CHF, will give 20 mg lasix 8/5 fair control on chlorthalidone , sys controlled ,mild elevation of diastolic   16.  Pt c/o no BM x 2 d but nsg recorded cont BM yesterday     LOS: 10 days A FACE TO FACE EVALUATION WAS PERFORMED  Charlett Blake 09/29/2020, 8:05 AM

## 2020-09-29 NOTE — Progress Notes (Signed)
Patient was calling to the front desk stating that he could not breathe. This nurse went into the room & patient was sitting at the side of the bed leaned over breathing heavily. The charge nurse & the nurse tech came into the room as this nurse looked up his orders. This nurse went out to the nurses station to get a breathing treatment at the patients' request. When returned, patients' oxygen saturation was noted at 100% on room air but he was still breathing heavily. Asked patient to lay back in the bed & he was resistant. Explained to the patient that he was leaned over & was c/o SOB, we needed him to sit back to prevent a fall while he was taking the nebulizer treatment. He was still resistant & moved very slowly & was informed that we needed his cooperation. The NT & this nurse had to pull him into position & pull him up in bed. He was given the nebulizer, which kept slipping out of his mouth. The NT was trying to get the rest of his vitals, but the patient was shaking his leg & moving his arm with the nebulizer. This nurse went to the other side of the bed to hold the nebulizer while she go the vitals. The vitals were not accurate so she was asked to wait for a few minutes before trying again. After treatment was over, NT did vitals & they were within normal limits. Asked patient if he felt any better & he nodded yes, but then was telling his visitor how he was cold. She told him to get the blanket, which was at the foot of his bed. When this nurse gave him the blanket, he responded that it was too thick. Asked him if he wanted the sheet instead. Did inform the patient that all he had to do was ask if he needed something & covered him with the sheet. No acute distress was noted,alarms on & left in the room with the NT & family. His nurse was informed.

## 2020-09-29 NOTE — Progress Notes (Signed)
Speech Language Pathology Daily Session Note  Patient Details  Name: Craig Knight MRN: [id number] Date of Birth: [birth year]  Today's Date: 09/29/2020 SLP Individual Time: 1102-1200 SLP Individual Time Calculation (min): 58 min  Short Term Goals: Week 2: SLP Short Term Goal 1 (Week 2): Patient will demonstrate effective mastication and oral clearance with regular consistency PO trials prior to diet advancement. SLP Short Term Goal 2 (Week 2): Patient will demonstrate improved self monitoring and correction with use of speech intelligibility strategies at the sentence level with minA verbal cues. SLP Short Term Goal 3 (Week 2): Patient will demonstrate functional problem solving and/or reasoning for mildly complex tasks with min A verbal/visual cues. SLP Short Term Goal 4 (Week 2): Patient will demonstrate recent recall (10 minutes or greater) of novel information with min A verbal cues for use of compensatory strategies  Skilled Therapeutic Interventions: Pt seen for skilled ST with focus on speech and cognitive goals. Pt initially mildly agitated, fidgeting in chair and internally and externally distracted. Pt provided extra time to communicate how to increase comfort I.e. room temp decreased, pts tray table re-arranged, socks removed and cold water presented within reach. Pt agreeable to therapeutic tasks at this time. Pt able to recall 1/3 compensatory strategies for speech, cues to scan environment increased to 3/3 when pt ID written sign in room. Pt requiring mod cues throughout session to utilize strategies, deficits in self-monitoring for communication breakdown this date. SLP and pt playing Uno, pt demonstrated sustained attention to task >25 minutes with occ verbal cues. Functional reasoning task with 70% accuracy provided mod cues. Wife present at end of session, reviewed pt performance and ongoing deficits. Pt left in wheelchair with alarm set. Cont ST POC.   Pain Pain Assessment Pain  Scale: 0-10 Pain Score: 0-No pain  Therapy/Group: Individual Therapy  Dewaine Conger 09/29/2020, 11:15 AM

## 2020-09-29 NOTE — Progress Notes (Signed)
Occupational Therapy Session Note  Patient Details  Name: [name] MRN: [id number] Date of Birth: 09-23-1965  Today's Date: 09/29/2020 OT Individual Time: [id number] OT Individual Time Calculation (min): 74 min    Short Term Goals: Week 2:  OT Short Term Goal 1 (Week 2): pt will complete sit<>stands with HW with MOD A OT Short Term Goal 2 (Week 2): pt will complete LB dressing with AD PRN with MOD A OT Short Term Goal 3 (Week 2): pt will use RUE consistently during ADLs as stabilizer during 2 consecutive OT sessions  Skilled Therapeutic Interventions/Progress Updates:  Pt greeted seated in recliner agreeable to OT intervention. Pt completed squat pivot transfer from recliner >w/c going to pts L side with MOD A. Pt transported to sink for bathing with pt needing MODA for bathing, issued pt LH sponge for LB bathing. Pt required MIN A for UB dressing and MAX A for LB dressing to don underwear and pants, pt was able to stand at sink with MIN A while OTA pulled up pants/underwear to waist line.  MAX A to don socks/ shoes. Pt required supervision for oral care from sitting in w/c, good carryover of compensatory methods for oral hygiene. Pt required MAX hand over hand assist to apply deo to pts LUE.  Worked on  PROM stretching with pt seated in w/c shoulder flexion only to 90*, elbow flexion/ extension, wrist flexion/ extension, shoulder ABD/ADD to 90* only and digit flexion/ extension. Pt additionally worked on towel slides with Hand over hand assist, shoulder horizontal ABD/ ADD.  Pt left seated in w/c with safety belt activated and all needs within reach.   Therapy Documentation Precautions:  Precautions Precautions: Fall, Other (comment) Precaution Comments: L hemianopsia; dense R hemiparesis; Zio Patch Restrictions Weight Bearing Restrictions: No General:   Vital Signs:  Pain: Pt reports no pain during session.    Therapy/Group: Individual Therapy  Corinne Ports [facility] 09/29/2020,  9:22 AM

## 2020-09-29 NOTE — Progress Notes (Signed)
Physical Therapy Session Note  Patient Details  Name: Craig Knight MRN: [id number] Date of Birth: Dec 23, 1965  Today's Date: 09/29/2020 PT Individual Time: 0915-1015 PT Individual Time Calculation (min): 60 min   Short Term Goals: Week 2:  PT Short Term Goal 1 (Week 2): Pt will perform supine<>sit with min assist PT Short Term Goal 2 (Week 2): Pt will perform sit<>stands with min assist PT Short Term Goal 3 (Week 2): Pt will perform bed<>chair transfers with min assist PT Short Term Goal 4 (Week 2): Pt will ambulate at least 130f with +2 mod assist  Skilled Therapeutic Interventions/Progress Updates:     Pt seated in w/c to start session, agreeable to therapy although requesting to return to bed and needs some convincing to go to rehab gym. Wheeled with totalA for time to main rehab gym. Completed sit<>stand with min/modA from w/c height and assisted to tall kneeling on mat table with +2 modA while using platform for UE support on table. In tall kneeling position, worked on hip thrusts for glut extension, repeated 1/2 kneeling to tall kneeling, and multi-plane reaching with stronger LUE to cones. Pt needing some rest breaks b/w activities and sets but otherwise was participatory. He c/o fatigue and wanting to go back to bed at this point - able to compromise with supine there-ex. Able to achieve supine position with minA from tall kneeling position. Completed 10 reps of supine bridges to target hip strengthening (limited clearance on R > L) and poor ability to maintain R knee in neutral alignment. Pt somewhat impulsively rising to short sitting position from supine without warning, reports of dizziness that he says improves with sitting up. No nystagmus noted. When asked what he would like to work on, he reports 'going to sleep in bed."  Worked on repeated sit<>stands with min/modA and R knee block with task overlay for hanging (and removing) horseshoes to tall mirror. Difficult with producing  power to rise for sit<>stands and has multiple "failures" with abrupt sitting back to mat table. Pt continuing to request to return to bed - completed squat<>pivot transfer with modA to w/c from mat table with assist needed for setup. Pt wheeled to his room and assisted to sitting EOB with modA via squat<>pivot transfer. Removed shoes with totalA and then pt requested to remain seated EOB at end of session rather than lying down. Explained safety precautions and fall risk reduction and offered to assist him in supine position or getting back to w/c where pt requested to return to w/c. Donned hospital socks for safety and assisted back to his w/c via squat<>pivot with modA (much more difficult for pt to transfer towards his R side with difficulty clearing hips and producing hip translation). Pt ended session seated in w/c with 1/2 lap tray supporting RUE, safety belt alarm on, and all needs in reach.  Therapy Documentation Precautions:  Precautions Precautions: Fall, Other (comment) Precaution Comments: L hemianopsia; dense R hemiparesis; Zio Patch Restrictions Weight Bearing Restrictions: No General:    Therapy/Group: Individual Therapy  CAlger Simons8/06/2020, 7:34 AM

## 2020-09-30 DIAGNOSIS — E1122 Type 2 diabetes mellitus with diabetic chronic kidney disease: Secondary | ICD-10-CM

## 2020-09-30 DIAGNOSIS — F329 Major depressive disorder, single episode, unspecified: Secondary | ICD-10-CM

## 2020-09-30 DIAGNOSIS — N1831 Chronic kidney disease, stage 3a: Secondary | ICD-10-CM

## 2020-09-30 DIAGNOSIS — N1832 Chronic kidney disease, stage 3b: Secondary | ICD-10-CM

## 2020-09-30 LAB — GLUCOSE, CAPILLARY
Glucose-Capillary: 108 mg/dL — ABNORMAL HIGH (ref 70–99)
Glucose-Capillary: 130 mg/dL — ABNORMAL HIGH (ref 70–99)
Glucose-Capillary: 161 mg/dL — ABNORMAL HIGH (ref 70–99)
Glucose-Capillary: 153 mg/dL — ABNORMAL HIGH (ref 70–99)

## 2020-09-30 MED ORDER — ALPRAZOLAM 0.25 MG PO TABS
0.2500 mg | ORAL_TABLET | Freq: Three times a day (TID) | ORAL | Status: DC | PRN
  Administered 2020-09-30 – 2020-10-12 (×21): 0.25 mg via ORAL
  Filled 2020-09-30 (×23): qty 1

## 2020-09-30 MED ORDER — SORBITOL 70 % SOLN
60.0000 mL | Status: AC
  Administered 2020-09-30: 60 mL via ORAL
  Filled 2020-09-30: qty 60

## 2020-09-30 MED ORDER — CITALOPRAM HYDROBROMIDE 10 MG PO TABS
10.0000 mg | ORAL_TABLET | Freq: Every day | ORAL | Status: DC
  Administered 2020-09-30 – 2020-10-12 (×13): 10 mg via ORAL
  Filled 2020-09-30 (×13): qty 1

## 2020-09-30 NOTE — Progress Notes (Addendum)
PROGRESS NOTE   Subjective/Complaints:  Pt lying in bed. Says he can't get comfortable. Upset that he can't move right side  ROS: Limited due to cognitive/behavioral   Objective:   No results found. No results for input(s): WBC, HGB, HCT, PLT in the last 72 hours. No results for input(s): NA, K, CL, CO2, GLUCOSE, BUN, CREATININE, CALCIUM in the last 72 hours.  Intake/Output Summary (Last 24 hours) at 09/30/2020 0939 Last data filed at 09/30/2020 0742 Gross per 24 hour  Intake 360 ml  Output --  Net 360 ml        Physical Exam: Vital Signs Blood pressure (!) 144/74, pulse 66, temperature 97.8 F (36.6 C), resp. rate 18, height '5\' 7"'$  (1.702 m), weight 96.1 kg, SpO2 100 %.   Constitutional: No distress . Vital signs reviewed. HEENT: NCAT, EOMI, oral membranes moist Neck: supple Cardiovascular: RRR without murmur. No JVD    Respiratory/Chest: CTA Bilaterally without wheezes or rales. Normal effort    GI/Abdomen: BS +, non-tender, non-distended Ext: no clubbing, cyanosis, or edema Psych: distant and difficult to engage Skin: No evidence of breakdown, no evidence of rash    Neurologic: dysarthric. Cranial nerves II through XII intact, motor strength is 5/5 in Left and remains 0/5 right deltoid, bicep, tricep, grip, hip flexor, knee extensors, ankle dorsiflexor and plantar flexor Sensory exam normal sensation to light touch and proprioception in bilateral upper and lower extremities Tone- clonus RIght pronators  Musculoskeletal: Full range of motion in all 4 extremities. No joint swelling   Assessment/Plan: 1. Functional deficits which require 3+ hours per day of interdisciplinary therapy in a comprehensive inpatient rehab setting. Physiatrist is providing close team supervision and 24 hour management of active medical problems listed below. Physiatrist and rehab team continue to assess barriers to discharge/monitor  patient progress toward functional and medical goals  Care Tool:  Bathing    Body parts bathed by patient: Right arm, Chest, Abdomen, Right lower leg, Left lower leg, Right upper leg, Left upper leg   Body parts bathed by helper: Left arm     Bathing assist Assist Level: Moderate Assistance - Patient 50 - 74%     Upper Body Dressing/Undressing Upper body dressing   What is the patient wearing?: Pull over shirt    Upper body assist Assist Level: Minimal Assistance - Patient > 75%    Lower Body Dressing/Undressing Lower body dressing      What is the patient wearing?: Pants, Underwear/pull up     Lower body assist Assist for lower body dressing: Maximal Assistance - Patient 25 - 49%     Toileting Toileting    Toileting assist Assist for toileting: Set up assist Assistive Device Comment: urinal   Transfers Chair/bed transfer  Transfers assist     Chair/bed transfer assist level: Moderate Assistance - Patient 50 - 74% (squat pivot to R)     Locomotion Ambulation   Ambulation assist   Ambulation activity did not occur: Safety/medical concerns  Assist level: 2 helpers (+2 mod A) Assistive device: Other (comment) (3 Musketeer) Max distance: 107f   Walk 10 feet activity   Assist  Walk 10 feet activity did not  occur: Safety/medical concerns  Assist level: 2 helpers (+2 mod A) Assistive device: Other (comment) (3 Musketeer)   Walk 50 feet activity   Assist Walk 50 feet with 2 turns activity did not occur:  (haven't progressed to turns)         Walk 150 feet activity   Assist Walk 150 feet activity did not occur: Safety/medical concerns         Walk 10 feet on uneven surface  activity   Assist Walk 10 feet on uneven surfaces activity did not occur: Safety/medical concerns         Wheelchair     Assist Will patient use wheelchair at discharge?:  (TBD) Type of Wheelchair: Manual    Wheelchair assist level: Supervision/Verbal  cueing Max wheelchair distance: 72'    Wheelchair 50 feet with 2 turns activity    Assist        Assist Level: Minimal Assistance - Patient > 75%   Wheelchair 150 feet activity     Assist          Blood pressure (!) 144/74, pulse 66, temperature 97.8 F (36.6 C), resp. rate 18, height '5\' 7"'$  (1.702 m), weight 96.1 kg, SpO2 100 %.    A/P  1.  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 Refused loop recorder and ZIO patch added. No UE or LE movement so far on RIght side              -patient may  shower              -ELOS/Goals: 14-21 days - min A to supervision- team conf in am   Continue CIR  2.  Antithrombotics:  -DVT/anticoagulation: Lovenox              -antiplatelet therapy: Aspirin 81 mg daily and Brilinta 90 mg twice daily for 30 days then back to aspirin and Plavix DAPT  3. Pain Management: Neurontin 300 mg nightly   4. Mood: Trazodone 100 mg nightly  -pt appears depressed. Family/staff report increased anxiety -add prn xanax -add celexa '10mg'$  qhs -Team to provide ego support as possible  -would be a good patient for neuropsych to see             -antipsychotic agents: N/A  5. Neuropsych: This patient is capable of making decisions on his own behalf.  6. Skin/Wound Care: Routine skin checks  7. Fluids/Electrolytes/Nutrition: Routine in and outs with follow-up chemistries  8.  Diabetes mellitus and peripheral neuropathy.  Hemoglobin A1c 7.4.  SSI.  Patient on Glucotrol 5 mg daily, Glucophage 500 mg twice daily prior to admission.  Resume as needed CBG (last 3)  Recent Labs    09/29/20 1654 09/29/20 2202 09/30/20 0552  GLUCAP 126* 104* 108*   Controlled 8/6 off oral hypoglycemics   9.  CKD stage III.  Follow-up chemistries  10.  Hyperlipidemia.  Lipitor  11.  COPD/tobacco abuse. Pt states he quite smoking 5 wks  PTA  Continue inhalers as directed.occ wheezing after therapy , advised pt to request prn inhaler   12.  Diastolic congestive heart failure.  No signs of fluid overload.  13.  Obesity.  BMI 35.25.  Dietary follow-up  14.  Incidental thyroid nodule measuring 1.5 cm.  Follow-up outpatient thyroid ultrasound 8.  Diabetes mellitus and peripheral neuropathy.  Hemoglobin A1c 7.4.  SSI.  Patient on Glucotrol 5 mg daily, Glucophage  500 mg twice daily prior to admission.  Resume as needed  15.  Permissive hypertension.  Currently on Hygroton 25 mg daily.  Patient also had been on hydralazine 50 mg twice daily, Imdur 60 mg daily Cozaar 100 mg daily prior to admission.     Vitals:   09/29/20 2207 09/30/20 0531  BP: (!) 157/91 (!) 144/74  Pulse: 69 66  Resp: 18 18  Temp: 98.1 F (36.7 C) 97.8 F (36.6 C)  SpO2: 98% 100%   8/6 fair control on chlorthalidone , sys controlled ,mild elevation of diastolic --no changes  16. No BM x 5 days. Sorbitol today, SSE if needed    LOS: 11 days A FACE TO [facility] 09/30/2020, 9:39 AM

## 2020-09-30 NOTE — Progress Notes (Signed)
Speech Language Pathology Daily Session Note  Patient Details  Name: Craig Knight MRN: [id number] Date of Birth: Jan 27, 1966  Today's Date: 09/30/2020 SLP Individual Time: [id number] SLP Individual Time Calculation (min): 45 min  Short Term Goals: Week 2: SLP Short Term Goal 1 (Week 2): Patient will demonstrate effective mastication and oral clearance with regular consistency PO trials prior to diet advancement. SLP Short Term Goal 2 (Week 2): Patient will demonstrate improved self monitoring and correction with use of speech intelligibility strategies at the sentence level with minA verbal cues. SLP Short Term Goal 3 (Week 2): Patient will demonstrate functional problem solving and/or reasoning for mildly complex tasks with min A verbal/visual cues. SLP Short Term Goal 4 (Week 2): Patient will demonstrate recent recall (10 minutes or greater) of novel information with min A verbal cues for use of compensatory strategies  Skilled Therapeutic Interventions: Patient initially mildly agitated and would not greet therapist upon arrival. He eventually stated he wasn't in a good mood, he was not happy with the temperature in his room, and that it was too cold. Patient refused all attempts to comfort including providing blanket, repositioning in bed, adjusting room temperature etc. Following additional emotional support, patient was agreeable to skilled ST intervention with focus on speech and swallowing goals. SLP facilitated regular texture PO trials in which patient presented with mildly prolonged mastication and trace-to-mild oral residue post swallows. Patient implemented independent liquid rinse to clear residuals with no overt s/sx of aspiration. Due to ongoing oral deficits, continue Dys 3 diet and thin liquids with continuation of regular texture PO trials. Of note, patient reported preference for Dys 3 textures at this time. Patient independently recalled 2/3 speech intelligibility strategies and  referred to visual on wall to recall third strategy. His speech intelligibility was perceived as ~75% intelligible at conversation level with min-to-mod A verbal reminders to increase vocal loudness and reduce speech rate. Patient easier to frustrate when given reminders for speech strategies. Patient required assistance using his iPhone to call his spouse at end of session. Recommend follow up with use of phone during next session. Patient was left in bed with alarm activated and immediate needs within reach at end of session. Continue per current plan of care.      Pain Pain Assessment Pain Scale: 0-10 Pain Score: 0-No pain  Therapy/Group: Individual Therapy  Patty Sermons 09/30/2020, 8:51 AM

## 2020-10-01 LAB — GLUCOSE, CAPILLARY
Glucose-Capillary: 121 mg/dL — ABNORMAL HIGH (ref 70–99)
Glucose-Capillary: 131 mg/dL — ABNORMAL HIGH (ref 70–99)
Glucose-Capillary: 122 mg/dL — ABNORMAL HIGH (ref 70–99)
Glucose-Capillary: 142 mg/dL — ABNORMAL HIGH (ref 70–99)

## 2020-10-01 MED ORDER — SENNOSIDES-DOCUSATE SODIUM 8.6-50 MG PO TABS
2.0000 | ORAL_TABLET | Freq: Every day | ORAL | Status: DC
  Administered 2020-10-02 – 2020-10-04 (×2): 2 via ORAL
  Filled 2020-10-01 (×4): qty 2

## 2020-10-01 NOTE — Progress Notes (Signed)
PROGRESS NOTE   Subjective/Complaints:  Pt lying in bed. Irritated that staff did not set him up with tray in bed since he can't move his right side. I offered to help him and he said he didn't want it any more.   ROS: Patient denies fever, rash, sore throat, blurred vision, nausea, vomiting, diarrhea, cough, shortness of breath or chest pain, joint or back pain, headache, or mood change.   Objective:   No results found. No results for input(s): WBC, HGB, HCT, PLT in the last 72 hours. No results for input(s): NA, K, CL, CO2, GLUCOSE, BUN, CREATININE, CALCIUM in the last 72 hours.  Intake/Output Summary (Last 24 hours) at 10/01/2020 0826 Last data filed at 10/01/2020 0718 Gross per 24 hour  Intake 240 ml  Output 325 ml  Net -85 ml        Physical Exam: Vital Signs Blood pressure 106/63, pulse 81, temperature 98.1 F (36.7 C), temperature source Oral, resp. rate 16, height '5\' 7"'$  (1.702 m), weight 88.8 kg, SpO2 96 %.   Constitutional: No distress . Vital signs reviewed. HEENT: NCAT, EOMI, oral membranes moist Neck: supple Cardiovascular: RRR without murmur. No JVD    Respiratory/Chest: CTA Bilaterally without wheezes or rales. Normal effort    GI/Abdomen: BS +, non-tender, non-distended Ext: no clubbing, cyanosis, or edema Psych: irritable, hard to engage at times Skin: No evidence of breakdown, no evidence of rash Neurologic: dysarthric. Cranial nerves II through XII intact, motor strength is 5/5 in Left and remains 0/5 right deltoid, bicep, tricep, grip, hip flexor, knee extensors, ankle dorsiflexor and plantar flexor Sensory exam normal sensation to light touch and proprioception in bilateral upper and lower extremities Tone- clonus RIght pronators  Musculoskeletal: Full range of motion in all 4 extremities. No joint swelling   Assessment/Plan: 1. Functional deficits which require 3+ hours per day of  interdisciplinary therapy in a comprehensive inpatient rehab setting. Physiatrist is providing close team supervision and 24 hour management of active medical problems listed below. Physiatrist and rehab team continue to assess barriers to discharge/monitor patient progress toward functional and medical goals  Care Tool:  Bathing    Body parts bathed by patient: Right arm, Chest, Abdomen, Right lower leg, Left lower leg, Right upper leg, Left upper leg   Body parts bathed by helper: Left arm     Bathing assist Assist Level: Moderate Assistance - Patient 50 - 74%     Upper Body Dressing/Undressing Upper body dressing   What is the patient wearing?: Pull over shirt    Upper body assist Assist Level: Minimal Assistance - Patient > 75%    Lower Body Dressing/Undressing Lower body dressing      What is the patient wearing?: Pants, Underwear/pull up     Lower body assist Assist for lower body dressing: Maximal Assistance - Patient 25 - 49%     Toileting Toileting    Toileting assist Assist for toileting: Set up assist Assistive Device Comment: urinal   Transfers Chair/bed transfer  Transfers assist     Chair/bed transfer assist level: Moderate Assistance - Patient 50 - 74% (squat pivot to R)     Locomotion Ambulation  Ambulation assist   Ambulation activity did not occur: Safety/medical concerns  Assist level: 2 helpers (+2 mod A) Assistive device: Other (comment) (3 Musketeer) Max distance: 85f   Walk 10 feet activity   Assist  Walk 10 feet activity did not occur: Safety/medical concerns  Assist level: 2 helpers (+2 mod A) Assistive device: Other (comment) (3 Musketeer)   Walk 50 feet activity   Assist Walk 50 feet with 2 turns activity did not occur:  (haven't progressed to turns)         Walk 150 feet activity   Assist Walk 150 feet activity did not occur: Safety/medical concerns         Walk 10 feet on uneven surface   activity   Assist Walk 10 feet on uneven surfaces activity did not occur: Safety/medical concerns         Wheelchair     Assist Will patient use wheelchair at discharge?:  (TBD) Type of Wheelchair: Manual    Wheelchair assist level: Supervision/Verbal cueing Max wheelchair distance: 568    Wheelchair 50 feet with 2 turns activity    Assist        Assist Level: Minimal Assistance - Patient > 75%   Wheelchair 150 feet activity     Assist          Blood pressure 106/63, pulse 81, temperature 98.1 F (36.7 C), temperature source Oral, resp. rate 16, height '5\' 7"'$  (1.702 m), weight 88.8 kg, SpO2 96 %.    A/P  1.  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 Refused loop recorder and ZIO patch added. No UE or LE movement so far on RIght side              -patient may  shower             -ELOS/Goals: 14-21 days - min A to supervision- team conf in am   Continue CIR  2.  Antithrombotics:  -DVT/anticoagulation: Lovenox              -antiplatelet therapy: Aspirin 81 mg daily and Brilinta 90 mg twice daily for 30 days then back to aspirin and Plavix DAPT  3. Pain Management: Neurontin 300 mg nightly   4. Mood/reactive depression/sleep: Trazodone 100 mg nightly  -8/6 pt appears depressed. Family/staff report increased anxiety -added prn xanax -added celexa '10mg'$  qhs -Team to provide ego support as possible  -would be a good patient for neuropsych to see             -antipsychotic agents: N/A  5. Neuropsych: This patient is capable of making decisions on his own behalf.  6. Skin/Wound Care: Routine skin checks  7. Fluids/Electrolytes/Nutrition: Routine in and outs with follow-up chemistries  8.  Diabetes mellitus and peripheral neuropathy.  Hemoglobin A1c 7.4.  SSI.  Patient on Glucotrol 5 mg daily, Glucophage 500 mg twice  daily prior to admission.  Resume as needed CBG (last 3)  Recent Labs    09/30/20 1635 09/30/20 2059 10/01/20 0517  GLUCAP 161* 153* 121*   Borderline Controlled 8/7 off oral hypoglycemics   9.  CKD stage III.  Follow-up chemistries  10.  Hyperlipidemia.  Lipitor  11.  COPD/tobacco abuse. Pt states he quite smoking 5 wks PTA  Continue inhalers as directed.occ wheezing after therapy , advised pt to request prn inhaler   12.  Diastolic congestive heart failure.  No signs  of fluid overload.  13.  Obesity.  BMI 35.25.  Dietary follow-up  14.  Incidental thyroid nodule measuring 1.5 cm.  Follow-up outpatient thyroid ultrasound 8.  Diabetes mellitus and peripheral neuropathy.  Hemoglobin A1c 7.4.  SSI.  Patient on Glucotrol 5 mg daily, Glucophage 500 mg twice daily prior to admission.  Resume as needed  15.  Permissive hypertension.  Currently on Hygroton 25 mg daily.  Patient also had been on hydralazine 50 mg twice daily, Imdur 60 mg daily Cozaar 100 mg daily prior to admission.     Vitals:   09/30/20 1858 10/01/20 0307  BP: 121/89 106/63  Pulse: 80 81  Resp: 16 16  Temp: 97.7 F (36.5 C) 98.1 F (36.7 C)  SpO2: 95% 96%   8/6 fair control on chlorthalidone , sys controlled ,mild elevation of diastolic --no changes  16. Had BM's 8/6 after sorbitol  -senna-s for mtc   LOS: 12 days A FACE TO FACE EVALUATION WAS PERFORMED  Meredith Staggers 10/01/2020, 8:26 AM

## 2020-10-01 NOTE — Progress Notes (Signed)
Physical Therapy Session Note  Patient Details  Name: Craig Knight MRN: [id number] Date of Birth: March 28, 1965  Today's Date: 10/01/2020 PT Individual Time: 1694-5038 PT Individual Time Calculation (min): 59 min   Short Term Goals: Week 1:  PT Short Term Goal 1 (Week 1): Pt will perform supine<>sit with mod assist PT Short Term Goal 1 - Progress (Week 1): Met PT Short Term Goal 2 (Week 1): Pt will perform sit<>stands with mod assist PT Short Term Goal 2 - Progress (Week 1): Met PT Short Term Goal 3 (Week 1): Pt will perform bed<>chair transfers with mod assist PT Short Term Goal 3 - Progress (Week 1): Met PT Short Term Goal 4 (Week 1): Pt will ambulate at least 68f using LRAD with max assist of 1 and +2 for safety PT Short Term Goal 4 - Progress (Week 1): Met  Skilled Therapeutic Interventions/Progress Updates:  Pt was seen bedside in the am. Pt performed rolling with side rail with min to mod A and verbal cues. Pt performed supine to edge of bed and edge of bed to supine with mod A and verbal cues.  Pt performed multiple sit to stand with min A and verbal cues. Pt performed multiple stand pivot transfers with mod A and verbal cues. Pt ambulated in parallel bars 8 feet x 3 with 2 helpers and verbal cues as well as physical assist to advance the R LE and UE. Pt returned to room following treatment and left sitting up in recliner with chair alarm and all needs within reach.   Therapy Documentation Precautions:  Precautions Precautions: Fall, Other (comment) Precaution Comments: L hemianopsia; dense R hemiparesis; Zio Patch Restrictions Weight Bearing Restrictions: No General:   Pain: Pain Assessment Pain Scale: 0-10 Pain Score: 0-No pain Faces Pain Scale: No hurt  Therapy/Group: Individual Therapy  MDub Amis8/08/2020, 12:26 PM

## 2020-10-02 ENCOUNTER — Ambulatory Visit: Payer: Medicare Other

## 2020-10-02 ENCOUNTER — Ambulatory Visit: Payer: Medicare Other | Admitting: Occupational Therapy

## 2020-10-02 LAB — GLUCOSE, CAPILLARY
Glucose-Capillary: 128 mg/dL — ABNORMAL HIGH (ref 70–99)
Glucose-Capillary: 114 mg/dL — ABNORMAL HIGH (ref 70–99)
Glucose-Capillary: 124 mg/dL — ABNORMAL HIGH (ref 70–99)
Glucose-Capillary: 115 mg/dL — ABNORMAL HIGH (ref 70–99)

## 2020-10-02 NOTE — Progress Notes (Signed)
Occupational Therapy Session Note  Patient Details  Name: Craig Knight MRN: [id number] Date of Birth: Mar 01, 1965  Today's Date: 10/02/2020 OT Individual Time: 5051-8335 OT Individual Time Calculation (min): 30 min    Short Term Goals: Week 2:  OT Short Term Goal 1 (Week 2): pt will complete sit<>stands with HW with MOD A OT Short Term Goal 2 (Week 2): pt will complete LB dressing with AD PRN with MOD A OT Short Term Goal 3 (Week 2): pt will use RUE consistently during ADLs as stabilizer during 2 consecutive OT sessions  Skilled Therapeutic Interventions/Progress Updates:    Pt received in wc and agreeable to therapy.pt taken to gym to work on towel slides on table for RUE for PROM and attempted elicitation of active movement. No activity.  Pt has extremely tight scapula musculature which he stated is premorbid from "weight lifting". Pt does have increased tone but his tone was able to relax with sustained stretches.  Pt taken back to room with all needs met.  Belt alarm on.     Therapy Documentation Precautions:  Precautions Precautions: Fall, Other (comment) Precaution Comments: L hemianopsia; dense R hemiparesis; Zio Patch Restrictions Weight Bearing Restrictions: No General:   Vital Signs:  Pain: Pain Assessment Pain Score: 0-No pain ADL: ADL Eating: Supervision/safety Where Assessed-Eating: Bed level Grooming: Minimal assistance Where Assessed-Grooming: Edge of bed Upper Body Bathing: Moderate assistance Where Assessed-Upper Body Bathing: Edge of bed Lower Body Bathing: Maximal assistance Where Assessed-Lower Body Bathing: Edge of bed Upper Body Dressing: Maximal assistance Where Assessed-Upper Body Dressing: Edge of bed Lower Body Dressing: Maximal assistance Where Assessed-Lower Body Dressing: Edge of bed Toileting: Maximal assistance Where Assessed-Toileting: Bedside Commode Toilet Transfer: Dependent Toilet Transfer Method: Other (comment) (Stedy  lift) Science writer: Geophysical data processor: Not assessed  Therapy/Group: Individual Therapy  [place] 10/02/2020, 1:13 PM

## 2020-10-02 NOTE — Progress Notes (Signed)
Occupational Therapy Session Note  Patient Details  Name: Craig Knight MRN: [id number] Date of Birth: 08/14/1965  Today's Date: 10/02/2020 OT Individual Time: 6948-5462 OT Individual Time Calculation (min): 47 min    Short Term Goals: Week 1:  OT Short Term Goal 1 (Week 1): Pt wll complete UB bathing at min assist sitting unsupported. OT Short Term Goal 1 - Progress (Week 1): Met OT Short Term Goal 2 (Week 1): Pt will complete UB dressing with min assist to donn a pullover shirt. OT Short Term Goal 2 - Progress (Week 1): Progressing toward goal OT Short Term Goal 3 (Week 1): Pt will complete LB bathing with mod assist sit to stand for two consecutive sessions. OT Short Term Goal 3 - Progress (Week 1): Progressing toward goal OT Short Term Goal 4 (Week 1): Pt will complete toilet transfers with mod assist squat pivot to the drop arm commode. OT Short Term Goal 4 - Progress (Week 1): Met  Skilled Therapeutic Interventions/Progress Updates:    Pt in wheelchair to start with his spouse [name] in the room as well.  He agreed to therapy and began with work on donning his lace up shoes from the wheelchair.  Mod assist for donning and tying the left with total assist for the right.  Therapist took him down to the therapy gym where he completed stand pivot transfer to the therapy mat with max assist.  Focused session on active functional movement with the RUE with use of the tilted stool.  Mod demonstrational cueing to avoid leaning to the left while working on active shoulder flexion.  He was able to push stool to target with increase time and occasional min assist.  He completed several repetitions with rest breaks given as needed.  Finished session with application of NMES to the right shoulder using the Saebo Stim One with intensity set on level 8 at the parameters below.  He was able to tolerate one hour of stimulation without pain or adverse reactions.  Finished session with return to the wheelchair  at max assist stand pivot and then return to the room via wheelchair.  Pt left with spouse and NT.   Saebo Stim One 330 pulse width 35 Hz pulse rate On 8 sec/ off 8 sec Ramp up/ down 2 sec Symmetrical Biphasic wave form  Max intensity 143m at 500 Ohm load   Therapy Documentation Precautions:  Precautions Precautions: Fall, Other (comment) Precaution Comments: L hemianopsia; dense R hemiparesis; Zio Patch Restrictions Weight Bearing Restrictions: No  Pain: Pain Assessment Pain Scale: Faces Pain Score: 0-No pain Pain Type: Acute pain Pain Location: Leg Pain Orientation: Right ADL: See Care Tool Section for some details of mobility and selfcare  Therapy/Group: Individual Therapy  Telia Amundson OTR/L 10/02/2020, 3:56 PM

## 2020-10-02 NOTE — Progress Notes (Signed)
Physical Therapy Session Note  Patient Details  Name: Craig Knight MRN: [id number] Date of Birth: 1965/11/11  Today's Date: 10/02/2020 PT Individual Time: [id number] PT Individual Time Calculation (min): 45 min   Short Term Goals: Week 2:  PT Short Term Goal 1 (Week 2): Pt will perform supine<>sit with min assist PT Short Term Goal 2 (Week 2): Pt will perform sit<>stands with min assist PT Short Term Goal 3 (Week 2): Pt will perform bed<>chair transfers with min assist PT Short Term Goal 4 (Week 2): Pt will ambulate at least 168f with +2 mod assist   Skilled Therapeutic Interventions/Progress Updates:    Pt received sitting in recliner at start of session. Agreeable to therapy. No reports of pain during session. Undressed other than wearing boxer briefs. Requires minA for donning tank (required ++ time for problem solving) Requires minA for donning L sock and totalA for donning R sock. TotalA for donning shoes. Retrieved disposable pants and cut to length (per pt request) and these were donned with modA while seated for threading over LE's - sit<>stand with minA and pulls pants up in standing with minA for balance. Completes squat<>pivot transfer with modA towards w/c, towards stronger L side. Instructed pt on w/c propulsion with hemi technique where he propels himself ~971fwith supervision using LUE/LLE - very slowed speed and requires supervision with cues for sequencing. Completed car transfer with [place] setup) via squat<>pivot and assist needed for managing RLE into and out of the car. Pt reports they own a 2004 honda accord and car height was set to simulate that. Of note, when attempted transfer, pt requested PT be hands off, stating "I don't need no help." He also showed frustration during dressing when PT instructed him to do as much as he could on his own. Pt returned to his room with totalA in w/c, remained seated with safety belt alarm on and all needs in reach. Attempted to  don R 1/2 lap tray but pt refused.   Therapy Documentation Precautions:  Precautions Precautions: Fall, Other (comment) Precaution Comments: L hemianopsia; dense R hemiparesis; Zio Patch Restrictions Weight Bearing Restrictions: No General:    Therapy/Group: Individual Therapy  ChAlger Simons/09/2020, 7:29 AM

## 2020-10-02 NOTE — Progress Notes (Signed)
PROGRESS NOTE   Subjective/Complaints:  C/o intermittent RUE pain mainly with spasms, some fatigue .  Discussed med management for both of these issues, pt declines mets for alertness  ROS: Patient denies CP, SOB, N/V/D Objective:   No results found. No results for input(s): WBC, HGB, HCT, PLT in the last 72 hours. No results for input(s): NA, K, CL, CO2, GLUCOSE, BUN, CREATININE, CALCIUM in the last 72 hours.  Intake/Output Summary (Last 24 hours) at 10/02/2020 0818 Last data filed at 10/02/2020 0720 Gross per 24 hour  Intake 600 ml  Output 475 ml  Net 125 ml         Physical Exam: Vital Signs Blood pressure (!) 138/91, pulse 75, temperature 98.4 F (36.9 C), resp. rate 18, height '5\' 7"'$  (1.702 m), weight 88.8 kg, SpO2 98 %.    General: No acute distress Mood and affect are appropriate Heart: Regular rate and rhythm no rubs murmurs or extra sounds Lungs: Clear to auscultation, breathing unlabored, no rales or wheezes Abdomen: Positive bowel sounds, soft nontender to palpation, nondistended Extremities: No clubbing, cyanosis, or edema   Skin: No evidence of breakdown, no evidence of rash Neurologic: dysarthric. Cranial nerves II through XII intact, motor strength is 5/5 in Left and remains 0/5 right deltoid, bicep, tricep, grip, hip flexor, knee extensors, ankle dorsiflexor and plantar flexor Sensory exam normal sensation to light touch and proprioception in bilateral upper and lower extremities Tone- clonus RIght pronators  Musculoskeletal: Full range of motion in all 4 extremities. No joint swelling   Assessment/Plan: 1. Functional deficits which require 3+ hours per day of interdisciplinary therapy in a comprehensive inpatient rehab setting. Physiatrist is providing close team supervision and 24 hour management of active medical problems listed below. Physiatrist and rehab team continue to assess barriers to  discharge/monitor patient progress toward functional and medical goals  Care Tool:  Bathing    Body parts bathed by patient: Right arm, Chest, Abdomen, Right lower leg, Left lower leg, Right upper leg, Left upper leg   Body parts bathed by helper: Left arm     Bathing assist Assist Level: Moderate Assistance - Patient 50 - 74%     Upper Body Dressing/Undressing Upper body dressing   What is the patient wearing?: Pull over shirt    Upper body assist Assist Level: Minimal Assistance - Patient > 75%    Lower Body Dressing/Undressing Lower body dressing      What is the patient wearing?: Pants, Underwear/pull up     Lower body assist Assist for lower body dressing: Maximal Assistance - Patient 25 - 49%     Toileting Toileting    Toileting assist Assist for toileting: Set up assist Assistive Device Comment: urinal   Transfers Chair/bed transfer  Transfers assist     Chair/bed transfer assist level: Moderate Assistance - Patient 50 - 74%     Locomotion Ambulation   Ambulation assist   Ambulation activity did not occur: Safety/medical concerns  Assist level: 2 helpers Assistive device: Parallel bars Max distance: 8   Walk 10 feet activity   Assist  Walk 10 feet activity did not occur: Safety/medical concerns  Assist level: 2 helpers (+  2 mod A) Assistive device: Other (comment) (3 Musketeer)   Walk 50 feet activity   Assist Walk 50 feet with 2 turns activity did not occur:  (haven't progressed to turns)         Walk 150 feet activity   Assist Walk 150 feet activity did not occur: Safety/medical concerns         Walk 10 feet on uneven surface  activity   Assist Walk 10 feet on uneven surfaces activity did not occur: Safety/medical concerns         Wheelchair     Assist Will patient use wheelchair at discharge?:  (TBD) Type of Wheelchair: Manual    Wheelchair assist level: Supervision/Verbal cueing Max wheelchair distance:  50    Wheelchair 50 feet with 2 turns activity    Assist        Assist Level: Supervision/Verbal cueing   Wheelchair 150 feet activity     Assist          Blood pressure (!) 138/91, pulse 75, temperature 98.4 F (36.9 C), resp. rate 18, height '5\' 7"'$  (1.702 m), weight 88.8 kg, SpO2 98 %.    A/P  1.  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 Refused loop recorder and ZIO patch added. No UE or LE movement so far on RIght side              -patient may  shower             -ELOS/Goals: 14-21 days - min A to supervision- team conf in am   Continue CIR  2.  Antithrombotics:  -DVT/anticoagulation: Lovenox              -antiplatelet therapy: Aspirin 81 mg daily and Brilinta 90 mg twice daily for 30 days then back to aspirin and Plavix DAPT  3. Pain Management: Neurontin 300 mg nightly   4. Mood/reactive depression/sleep: Trazodone 100 mg nightly  Irritable at times              -antipsychotic agents: N/A  5. Neuropsych: This patient is capable of making decisions on his own behalf.  6. Skin/Wound Care: Routine skin checks  7. Fluids/Electrolytes/Nutrition: Routine in and outs with follow-up chemistries  8.  Diabetes mellitus and peripheral neuropathy.  Hemoglobin A1c 7.4.  SSI.  Patient on Glucotrol 5 mg daily, Glucophage 500 mg twice daily prior to admission.  Resume as needed CBG (last 3)  Recent Labs    10/01/20 1607 10/01/20 2113 10/02/20 0530  GLUCAP 122* 142* 128*    Controlled off meds   9.  CKD stage III.  Follow-up chemistries  10.  Hyperlipidemia.  Lipitor  11.  COPD/tobacco abuse. Pt states he quite smoking 5 wks PTA  Continue inhalers as directed.occ wheezing after therapy , advised pt to request prn inhaler   12.  Diastolic congestive heart failure.  No signs of fluid overload.  13.  Obesity.  BMI 35.25.   Dietary follow-up  14.  Incidental thyroid nodule measuring 1.5 cm.  Follow-up outpatient thyroid ultrasound 8.  Diabetes mellitus and peripheral neuropathy.  Hemoglobin A1c 7.4.  SSI.  Patient on Glucotrol 5 mg daily, Glucophage 500 mg twice daily prior to admission.  Resume as needed  15.  Permissive hypertension.  Currently on Hygroton 25 mg daily.  Patient also had been on hydralazine 50 mg twice daily, Imdur 60 mg daily Cozaar 100  mg daily prior to admission.     Vitals:   10/01/20 1959 10/02/20 0329  BP: 122/84 (!) 138/91  Pulse: 82 75  Resp: (!) 23 18  Temp: 98 F (36.7 C) 98.4 F (36.9 C)  SpO2: 99% 98%   8/8 fair control on chlorthalidone ,   16. Had BM's 8/6 after sorbitol  -senna-s for mtc   LOS: 13 days A FACE TO [place] E Craig Knight 10/02/2020, 8:18 AM

## 2020-10-02 NOTE — Progress Notes (Signed)
Speech Language Pathology Daily Session Note  Patient Details  Name: Craig Knight MRN: [id number] Date of Birth: June 19, 1965  Today's Date: 10/02/2020 SLP Individual Time: [id number] SLP Individual Time Calculation (min): 60 min  Short Term Goals: Week 2: SLP Short Term Goal 1 (Week 2): Patient will demonstrate effective mastication and oral clearance with regular consistency PO trials prior to diet advancement. SLP Short Term Goal 2 (Week 2): Patient will demonstrate improved self monitoring and correction with use of speech intelligibility strategies at the sentence level with minA verbal cues. SLP Short Term Goal 3 (Week 2): Patient will demonstrate functional problem solving and/or reasoning for mildly complex tasks with min A verbal/visual cues. SLP Short Term Goal 4 (Week 2): Patient will demonstrate recent recall (10 minutes or greater) of novel information with min A verbal cues for use of compensatory strategies  Skilled Therapeutic Interventions: Patient agreeable to skilled ST intervention with focus on speech and cognitive goals. SLP facilitated session by providing overall min-to-mod A verbal cues to utilize speech intelligibility strategies which was perceived as ~75% intelligible at sentence and conversation level. Patient independently recalled 3/3 trained speech strategies without use of visual aid. Facilitated basic problem solving and verbal reasoning skills with min A verbal cues for accuracy. Wife was present and inquired about bringing patient outside food. Educated spouse about current swallow function and ongoing oral deficits impacting oral & buccal clearance, and decreased mastication resulting in recommendations for Dys 3 textures and thin liquids. Educated extensively regarding Dys 3 diet and precautions. Wife and SLP engaged in discussion regarding what foods and textures would be appropriate vs. not appropriate to consume right now from a swallowing standpoint. Spouse  verbalized understanding through teach back. Will continue to trial regular textures. Patient was left in wheelchair with alarm activated and immediate needs within reach at end of session. Continue per current plan of care.      Pain Pain Assessment Pain Scale: 0-10 Pain Score: 0-No pain  Therapy/Group: Individual Therapy  Patty Sermons 10/02/2020, 3:34 PM

## 2020-10-02 NOTE — Progress Notes (Signed)
Patient ID: Craig Knight, male   DOB: 09-Nov-1965, 55 y.o.   MRN: [id number]  Patient accepted by [facility] for PT/OT/ST  [name], [place]

## 2020-10-02 NOTE — Progress Notes (Signed)
Patient ID: Craig Knight, male   DOB: 07/19/1965, 55 y.o.   MRN: [id number]  Patient [facility] referral sent to [place], [place]

## 2020-10-03 LAB — GLUCOSE, CAPILLARY
Glucose-Capillary: 131 mg/dL — ABNORMAL HIGH (ref 70–99)
Glucose-Capillary: 112 mg/dL — ABNORMAL HIGH (ref 70–99)
Glucose-Capillary: 133 mg/dL — ABNORMAL HIGH (ref 70–99)
Glucose-Capillary: 113 mg/dL — ABNORMAL HIGH (ref 70–99)

## 2020-10-03 NOTE — Progress Notes (Signed)
Speech Language Pathology Daily Session Note  Patient Details  Name: Craig Knight MRN: [id number] Date of Birth: 04-Jul-1965  Today's Date: 10/03/2020 SLP Individual Time: 0930-1030 SLP Individual Time Calculation (min): 60 min  Short Term Goals: Week 2: SLP Short Term Goal 1 (Week 2): Patient will demonstrate effective mastication and oral clearance with regular consistency PO trials prior to diet advancement. SLP Short Term Goal 2 (Week 2): Patient will demonstrate improved self monitoring and correction with use of speech intelligibility strategies at the sentence level with minA verbal cues. SLP Short Term Goal 3 (Week 2): Patient will demonstrate functional problem solving and/or reasoning for mildly complex tasks with min A verbal/visual cues. SLP Short Term Goal 4 (Week 2): Patient will demonstrate recent recall (10 minutes or greater) of novel information with min A verbal cues for use of compensatory strategies  Skilled Therapeutic Interventions: Patient agreeable to skilled ST intervention with focus on speech and cognitive goals. Patient perceived as ~50% intelligible at conversation level, and 75% intelligible at sentence level. Patient required min A verbal cues to implement speech intelligible strategies to achieve 75% intelligibility at conversation level, and 90% at sentence level. Patient continues to require assistance with locating recent calls and contacts in his phone. SLP provided assistance and patient was able to successfully recall appropriate steps independently following 20 minute delay. Patient was left in wheelchair with alarm activated and immediate needs within reach at end of session. Continue per current plan of care.      Pain Pain Assessment Pain Scale: 0-10 Pain Score: 2  Pain Type: Acute pain Pain Location: Hand Pain Orientation: Right Pain Descriptors / Indicators: Discomfort Pain Onset: With Activity Pain Intervention(s): Repositioned  Therapy/Group:  Individual Therapy  Arneshia Ade T Mitra Duling 10/03/2020, 10:26 AM

## 2020-10-03 NOTE — Progress Notes (Signed)
PROGRESS NOTE   Subjective/Complaints:  No issues overnite  ROS: Patient denies CP, SOB, N/V/D Objective:   No results found. No results for input(s): WBC, HGB, HCT, PLT in the last 72 hours. No results for input(s): NA, K, CL, CO2, GLUCOSE, BUN, CREATININE, CALCIUM in the last 72 hours.  Intake/Output Summary (Last 24 hours) at 10/03/2020 0745 Last data filed at 10/03/2020 0725 Gross per 24 hour  Intake 600 ml  Output 500 ml  Net 100 ml         Physical Exam: Vital Signs Blood pressure 116/77, pulse 63, temperature 98 F (36.7 C), temperature source Oral, resp. rate 18, height '5\' 7"'$  (1.702 m), weight 92.1 kg, SpO2 98 %.  General: No acute distress Mood and affect are appropriate Heart: Regular rate and rhythm no rubs murmurs or extra sounds Lungs: Clear to auscultation, breathing unlabored, no rales or wheezes Abdomen: Positive bowel sounds, soft nontender to palpation, nondistended Extremities: No clubbing, cyanosis, or edema  Skin: No evidence of breakdown, no evidence of rash Neurologic: dysarthric. Cranial nerves II through XII intact, motor strength is 5/5 in Left and remains 0/5 right deltoid, bicep, tricep, grip, hip flexor, knee extensors, ankle dorsiflexor and plantar flexor Sensory exam normal sensation to light touch and proprioception in bilateral upper and lower extremities Tone- clonus RIght pronators  Musculoskeletal: Full range of motion in all 4 extremities. No joint swelling   Assessment/Plan: 1. Functional deficits which require 3+ hours per day of interdisciplinary therapy in a comprehensive inpatient rehab setting. Physiatrist is providing close team supervision and 24 hour management of active medical problems listed below. Physiatrist and rehab team continue to assess barriers to discharge/monitor patient progress toward functional and medical goals  Care Tool:  Bathing    Body parts  bathed by patient: Right arm, Chest, Abdomen, Right lower leg, Left lower leg, Right upper leg, Left upper leg   Body parts bathed by helper: Left arm     Bathing assist Assist Level: Moderate Assistance - Patient 50 - 74%     Upper Body Dressing/Undressing Upper body dressing   What is the patient wearing?: Pull over shirt    Upper body assist Assist Level: Minimal Assistance - Patient > 75%    Lower Body Dressing/Undressing Lower body dressing      What is the patient wearing?: Pants, Underwear/pull up     Lower body assist Assist for lower body dressing: Maximal Assistance - Patient 25 - 49%     Toileting Toileting    Toileting assist Assist for toileting: Set up assist Assistive Device Comment: urinal   Transfers Chair/bed transfer  Transfers assist     Chair/bed transfer assist level: Moderate Assistance - Patient 50 - 74%     Locomotion Ambulation   Ambulation assist   Ambulation activity did not occur: Safety/medical concerns  Assist level: 2 helpers Assistive device: Parallel bars Max distance: 8   Walk 10 feet activity   Assist  Walk 10 feet activity did not occur: Safety/medical concerns  Assist level: 2 helpers (+2 mod A) Assistive device: Other (comment) (3 Musketeer)   Walk 50 feet activity   Assist Walk 50 feet with  2 turns activity did not occur:  (haven't progressed to turns)         Walk 150 feet activity   Assist Walk 150 feet activity did not occur: Safety/medical concerns         Walk 10 feet on uneven surface  activity   Assist Walk 10 feet on uneven surfaces activity did not occur: Safety/medical concerns         Wheelchair     Assist Will patient use wheelchair at discharge?:  (TBD) Type of Wheelchair: Manual    Wheelchair assist level: Supervision/Verbal cueing Max wheelchair distance: 70'    Wheelchair 50 feet with 2 turns activity    Assist        Assist Level: Supervision/Verbal  cueing   Wheelchair 150 feet activity     Assist      Assist Level: Dependent - Patient 0%   Blood pressure 116/77, pulse 63, temperature 98 F (36.7 C), temperature source Oral, resp. rate 18, height '5\' 7"'$  (1.702 m), weight 92.1 kg, SpO2 98 %.    A/P  1.  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 Refused loop recorder and ZIO patch added. No UE or LE movement so far on RIght side              -patient may  shower             -ELOS/Goals: 8/20, team conf in am   Continue CIR  2.  Antithrombotics:  -DVT/anticoagulation: Lovenox              -antiplatelet therapy: Aspirin 81 mg daily and Brilinta 90 mg twice daily for 30 days then back to aspirin and Plavix DAPT  3. Pain Management: Neurontin 300 mg nightly   4. Mood/reactive depression/sleep: Trazodone 100 mg nightly  Irritable at times              -antipsychotic agents: N/A  5. Neuropsych: This patient is capable of making decisions on his own behalf.  6. Skin/Wound Care: Routine skin checks  7. Fluids/Electrolytes/Nutrition: Routine in and outs with follow-up chemistries  8.  Diabetes mellitus and peripheral neuropathy.  Hemoglobin A1c 7.4.  SSI.  Patient on Glucotrol 5 mg daily, Glucophage 500 mg twice daily prior to admission.  Resume as needed CBG (last 3)  Recent Labs    10/02/20 1632 10/02/20 2111 10/03/20 0605  GLUCAP 124* 115* 131*    Controlled off meds   9.  CKD stage III.  Follow-up chemistries  10.  Hyperlipidemia.  Lipitor  11.  COPD/tobacco abuse. Pt states he quite smoking 5 wks PTA  Continue inhalers as directed.occ wheezing after therapy , advised pt to request prn inhaler   12.  Diastolic congestive heart failure.  No signs of fluid overload.  13.  Obesity.  BMI 35.25.  Dietary follow-up  14.  Incidental thyroid nodule measuring 1.5 cm.  Follow-up  outpatient thyroid ultrasound 8.  Diabetes mellitus and peripheral neuropathy.  Hemoglobin A1c 7.4.  SSI.  Patient on Glucotrol 5 mg daily, Glucophage 500 mg twice daily prior to admission.  Resume as needed  15.  Permissive hypertension.  Currently on Hygroton 25 mg daily.  Patient also had been on hydralazine 50 mg twice daily, Imdur 60 mg daily Cozaar 100 mg daily prior to admission.     Vitals:   10/02/20 1943 10/03/20 0430  BP: (!) 141/91 116/77  Pulse: 94 63  Resp: 18 18  Temp: 99 F (37.2 C) 98 F (36.7 C)  SpO2: 97% 98%   Good control this am , slightly elevated this am , monitor on current meds   16. Had BM's 8/6 after sorbitol  -senna-s for mtc   LOS: 14 days A FACE TO Ruby E Josealfredo Adkins 10/03/2020, 7:45 AM

## 2020-10-03 NOTE — Progress Notes (Signed)
Physical Therapy Session Note  Patient Details  Name: Craig Knight MRN: [id number] Date of Birth: [birth year]  Today's Date: 10/03/2020 PT Individual Time: 1307-1402 PT Individual Time Calculation (min): 55 min   Short Term Goals: Week 2:  PT Short Term Goal 1 (Week 2): Pt will perform supine<>sit with min assist PT Short Term Goal 2 (Week 2): Pt will perform sit<>stands with min assist PT Short Term Goal 3 (Week 2): Pt will perform bed<>chair transfers with min assist PT Short Term Goal 4 (Week 2): Pt will ambulate at least 129f with +2 mod assist  Skilled Therapeutic Interventions/Progress Updates:    Pt received sitting in w/c with his wife, LLattie Haw present and pt agreeable to therapy session. Donned B LE TED hose and tennis shoes total assist for time management - pt still unable to lift R LE during open chain tasks despite muscle activation in WBing. Transported to/from gym in w/c for time management and energy conservation.   Donned R LE DF assist ACE wrap.   Gait training 360fx3 - using L UE support around +2 assist's shoulders progressed to only L HHA for balance on 3rd gait trail - requires +2 mod assist for balance and R LE gait mechanics - pt now able to advance R LE with only max assist to increase step length and avoid excessive hip adduction and external rotation (noticed some compensatory strategies of L lateral trunk lean and hip hiking with cuing throughout to avoid) - requires min/mod assist to facilitate increased R hip/knee extension during stance with forward movement of pelvis over R stance limb - pt demos significant improvement in upright posture and R/L weight shifting onto stance limb resulting in decreased assistance required today.   Sitting in w/c<>tall kneeling on mat with B UE support on bench and +2 mod assist for balance and R LE management onto/off of mat. In tall kneeling, with mirror feedback, focusing on increased R hip extension and upright posture without  using L UE support - trying to sustain this upright, midline pelvic posture for at least 15 seconds at a time with great difficulty noted and pt reporting muscle burn "all over" from this challenge.   Transported back to room and pt agreeable to remain sitting up in w/c - left with needs in reach, R UE supported on pillow, seat belt alarm on, and his wife present.   Therapy Documentation Precautions:  Precautions Precautions: Fall, Other (comment) Precaution Comments: L hemianopsia; dense R hemiparesis; Zio Patch Restrictions Weight Bearing Restrictions: No   Pain: No reports of pain throughout session.    Therapy/Group: Individual Therapy  CaTawana Scale PT, DPT, NCS, CSRS 10/03/2020, 12:20 PM

## 2020-10-03 NOTE — Progress Notes (Signed)
Occupational Therapy Session Note  Patient Details  Name: Craig Knight MRN: [id number] Date of Birth: Nov 14, 1965  Today's Date: 10/03/2020 OT Individual Time: 0800-0904 OT Individual Time Calculation (min): 64 min    Short Term Goals: Week 2:  OT Short Term Goal 1 (Week 2): pt will complete sit<>stands with HW with MOD A OT Short Term Goal 2 (Week 2): pt will complete LB dressing with AD PRN with MOD A OT Short Term Goal 3 (Week 2): pt will use RUE consistently during ADLs as stabilizer during 2 consecutive OT sessions  Skilled Therapeutic Interventions/Progress Updates:    Pt up in wheelchair to start with focus of session on showering, dressing, and grooming tasks.  [name] was utilized for transfer into the shower secondary to time limitations.  He was able to transfer out of the shower with mod assist stand pivot once bathing was completed.  Max hand over hand assist was needed to integrate the RUE for washing the LUE.  He was able to utilize a LH sponge for washing his lower legs and back.  Max assist was also needed for assisting pt to hold up the RUE in order to wash under the arm.  He completed sit to stand when drying off his bottom with min assist but chose to wash it from seated position with lateral leans to the right.  Completed dressing sit to stand at the sink with min assist for UB and mod assist for LB following hemi techniques.  Worked on crossing the RLE at the ankle for assist with donning his underpants and pants, as he could not tolerate crossing it over the left knee.  Min assist for standing to pull items over his hips as well.  Total assist for gripper socks secondary to decreased time and decreased ability to reach down to his feet.  Applied Saebo Stim One to the right shoulder for treatment of subluxation on level 8.  He was able to tolerate for 60 mins without any adverse reactions.  See below for parameters of device.  Call button and phone left in reach at the end of the  session with safety alarm belt in place.      Therapy Documentation Precautions:  Precautions Precautions: Fall, Other (comment) Precaution Comments: L hemianopsia; dense R hemiparesis; Zio Patch Restrictions Weight Bearing Restrictions: No   Pain: Pain Assessment Pain Scale: Faces Pain Score: 0-No pain ADL: See Care Tool Section for some details of mobility and selfcare   Therapy/Group: Individual Therapy  Osmar Howton OTR/L 10/03/2020, 12:11 PM

## 2020-10-03 NOTE — Progress Notes (Signed)
Speech Language Pathology Weekly Progress and Session Note  Patient Details  Name: Craig Knight MRN: [id number] Date of Birth: Nov 16, 1965  Beginning of progress report period: September 27, 2020 End of progress report period: October 04, 2020  Today's Date: 10/04/2020 SLP Individual Time: 8032-1224 SLP Individual Time Calculation (min): 59 min  Short Term Goals: Week 2: SLP Short Term Goal 1 (Week 2): Patient will demonstrate effective mastication and oral clearance with regular consistency PO trials prior to diet advancement. SLP Short Term Goal 1 - Progress (Week 2): Met SLP Short Term Goal 2 (Week 2): Patient will demonstrate improved self monitoring and correction with use of speech intelligibility strategies at the sentence level with minA verbal cues. SLP Short Term Goal 2 - Progress (Week 2): Met SLP Short Term Goal 3 (Week 2): Patient will demonstrate functional problem solving and/or reasoning for mildly complex tasks with min A verbal/visual cues. SLP Short Term Goal 3 - Progress (Week 2): Met SLP Short Term Goal 4 (Week 2): Patient will demonstrate recent recall (10 minutes or greater) of novel information with min A verbal cues for use of compensatory strategies SLP Short Term Goal 4 - Progress (Week 2): Met  New Short Term Goals: Week 3: SLP Short Term Goal 1 (Week 3): STG=LTG due to ELOS  Weekly Progress Updates: Patient has met 4 of 4 short term goals this reporting period. Patient has made steady progress with ST over the past week with emphasis on motor speech, swallowing, and cognition. Patient is likely near baseline level of functioning from a cognitive and speech standpoint per spouse report. Patient is currently completing cognitive-linguistic tasks with sup-min A verbal and visual cues for problem solving, recall, and emergent awareness. Patient has progressed to requiring sup-to-min A to implement speech intelligibility strategies to enhance speech to 75-90% intelligible  at sentence and conversation level. Patient is currently consuming Dys 3 diet and thin liquids. He has participated in trials of regular consistencies where he exhibits mildly prolonged yet effective mastication and trace oral residue in right buccal cavity. Patient demonstrates ability to perform lingual or finger sweep to clear residuals at independent level. Despite being appropriate for regular consistency trial tray, patient expressed preference for Dys 3 diet at this time to ensure foods are bite sized pieces and soft as he reports the food he receives from the hospital has a tendency to be dry. Patient/family education is ongoing with spouse who is often present during scheduled therapy sessions. Recommend continued skilled ST intervention to further maximize speech, cognitive, and swallow function and functional independence prior to discharge. Recommended frequency is 3x/week due to patient reaching baseline function, however can continue to benefit from skilled ST services to generalize skills and continue to monitor swallow function. Continue progression toward established LTGs.   Intensity: Minumum of 1-2 x/day, 30 to 90 minutes Frequency: 1 to 3 out of 7 days Duration/Length of Stay: 8/20 Treatment/Interventions: Cognitive remediation/compensation;Environmental controls;Internal/external aids;Speech/Language facilitation;Cueing hierarchy;Dysphagia/aspiration precaution training;Functional tasks;Patient/family education;Therapeutic Activities  Daily Session Skilled Therapeutic Interventions: Patient agreeable to skilled ST intervention with focus on speech and swallowing goals. Patient exhibited significant improvement with speech intelligibility and implementation of speech strategies to achieve 75-90% intelligibility at conversation level with sup A verbal reminders to reduce speech rate and increase vocal intensity. Patient performed self feeding with set-up assist for regular PO trials.  Patient tolerated well with mildly prolonged yet effective mastication. And trace oral residue in right buccal cavity in which he independently cleared with lingual  sweep and liquid rinse. Patient exhibited appropriate rate of consumption and bolus volumes. Overall mod I for self feeding and swallowing. Based on clinical assessment of regular PO tolerance, patient would be appropriate for regular texture trial tray, however patient requested to remain on Dys 3 textures. Patient continues to report difficulty with navigating his phone. He expressed he recently switched cell phone providers (Android to iPhone) and his current phone is new to him thus he never fully learned how to navigate prior to his CVA. Patient exhibited decreased recall steps involved in locating his contacts list which was addressed during yesterday's session. Today he required sup A verbal cues for recall after 15 minute delay. Patient was left in recliner chair with alarm activated and immediate needs within reach at end of session. Continue per current plan of care.       General    Pain: patient denied pain    Therapy/Group: Individual Therapy  Patty Sermons 10/04/2020, 4:31 PM

## 2020-10-04 LAB — GLUCOSE, CAPILLARY
Glucose-Capillary: 140 mg/dL — ABNORMAL HIGH (ref 70–99)
Glucose-Capillary: 123 mg/dL — ABNORMAL HIGH (ref 70–99)
Glucose-Capillary: 130 mg/dL — ABNORMAL HIGH (ref 70–99)
Glucose-Capillary: 130 mg/dL — ABNORMAL HIGH (ref 70–99)

## 2020-10-04 MED ORDER — TRAZODONE HCL 50 MG PO TABS
50.0000 mg | ORAL_TABLET | Freq: Every day | ORAL | Status: DC
  Administered 2020-10-04 – 2020-10-12 (×9): 50 mg via ORAL
  Filled 2020-10-04 (×9): qty 1

## 2020-10-04 NOTE — Progress Notes (Addendum)
PROGRESS NOTE   Subjective/Complaints: No issues overnite  ROS: Patient denies CP, SOB, N/V/D Objective:   No results found. No results for input(s): WBC, HGB, HCT, PLT in the last 72 hours. No results for input(s): NA, K, CL, CO2, GLUCOSE, BUN, CREATININE, CALCIUM in the last 72 hours.  Intake/Output Summary (Last 24 hours) at 10/04/2020 0802 Last data filed at 10/04/2020 0630 Gross per 24 hour  Intake 360 ml  Output 475 ml  Net -115 ml         Physical Exam: Vital Signs Blood pressure 122/81, pulse 71, temperature 98.5 F (36.9 C), temperature source Oral, resp. rate 18, height 5' 7"  (1.702 m), weight 92.1 kg, SpO2 97 %.  General: No acute distress Mood and affect are appropriate Heart: Regular rate and rhythm no rubs murmurs or extra sounds Lungs: Clear to auscultation, breathing unlabored, no rales or wheezes Abdomen: Positive bowel sounds, soft nontender to palpation, nondistended Extremities: No clubbing, cyanosis, or edema  Skin: No evidence of breakdown, no evidence of rash Neurologic: dysarthric. Cranial nerves II through XII intact, motor strength is 5/5 in Left and remains 0/5 right deltoid, bicep, tricep, grip, hip flexor, knee extensors, ankle dorsiflexor and plantar flexor Sensory exam normal sensation to light touch and proprioception in bilateral upper and lower extremities Tone- clonus RIght pronators  Musculoskeletal: Full range of motion in all 4 extremities. No joint swelling   Assessment/Plan: 1. Functional deficits which require 3+ hours per day of interdisciplinary therapy in a comprehensive inpatient rehab setting. Physiatrist is providing close team supervision and 24 hour management of active medical problems listed below. Physiatrist and rehab team continue to assess barriers to discharge/monitor patient progress toward functional and medical goals  Care Tool:  Bathing    Body parts  bathed by patient: Chest, Abdomen, Right lower leg, Left lower leg, Right upper leg, Left upper leg, Front perineal area, Buttocks, Face   Body parts bathed by helper: Right arm, Left arm     Bathing assist Assist Level: Minimal Assistance - Patient > 75%     Upper Body Dressing/Undressing Upper body dressing   What is the patient wearing?: Pull over shirt    Upper body assist Assist Level: Minimal Assistance - Patient > 75%    Lower Body Dressing/Undressing Lower body dressing      What is the patient wearing?: Pants, Underwear/pull up     Lower body assist Assist for lower body dressing: Moderate Assistance - Patient 50 - 74%     Toileting Toileting    Toileting assist Assist for toileting: Set up assist Assistive Device Comment: urinal   Transfers Chair/bed transfer  Transfers assist     Chair/bed transfer assist level: Moderate Assistance - Patient 50 - 74%     Locomotion Ambulation   Ambulation assist   Ambulation activity did not occur: Safety/medical concerns  Assist level: 2 helpers (+2 mod A) Assistive device: Other (comment) (B HHA) Max distance: 70f   Walk 10 feet activity   Assist  Walk 10 feet activity did not occur: Safety/medical concerns  Assist level: 2 helpers (+2 mod A) Assistive device: Other (comment) (3 Musketeer)   Walk 50 feet  activity   Assist Walk 50 feet with 2 turns activity did not occur:  (haven't progressed to turns)         Walk 150 feet activity   Assist Walk 150 feet activity did not occur: Safety/medical concerns         Walk 10 feet on uneven surface  activity   Assist Walk 10 feet on uneven surfaces activity did not occur: Safety/medical concerns         Wheelchair     Assist Will patient use wheelchair at discharge?:  (TBD) Type of Wheelchair: Manual    Wheelchair assist level: Supervision/Verbal cueing Max wheelchair distance: 26'    Wheelchair 50 feet with 2 turns  activity    Assist        Assist Level: Supervision/Verbal cueing   Wheelchair 150 feet activity     Assist      Assist Level: Dependent - Patient 0%   Blood pressure 122/81, pulse 71, temperature 98.5 F (36.9 C), temperature source Oral, resp. rate 18, height 5' 7"  (1.702 m), weight 92.1 kg, SpO2 97 %.    A/P  1.  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 Refused loop recorder and ZIO patch added. No UE or LE movement so far on RIght side              -patient may  shower             -ELOS/Goals: 8/20, Team conference today please see physician documentation under team conference tab, met with team  to discuss problems,progress, and goals. Formulized individual treatment plan based on medical history, underlying problem and comorbidities.   Continue CIR  2.  Antithrombotics:  -DVT/anticoagulation: Lovenox              -antiplatelet therapy: Aspirin 81 mg daily and Brilinta 90 mg twice daily for 30 days then back to aspirin and Plavix DAPT  3. Pain Management: Neurontin 300 mg nightly   4. Mood/reactive depression/sleep: Trazodone 100 mg nightly, having some adjustment issues , would like neuropsych to follow , some hangover effect with higher trazodone dose will reduce to 6m  Irritable at times , on celexa 162mper day             -antipsychotic agents: N/A  5. Neuropsych: This patient is capable of making decisions on his own behalf.  6. Skin/Wound Care: Routine skin checks  7. Fluids/Electrolytes/Nutrition: Routine in and outs with follow-up chemistries  8.  Diabetes mellitus and peripheral neuropathy.  Hemoglobin A1c 7.4.  SSI.  Patient on Glucotrol 5 mg daily, Glucophage 500 mg twice daily prior to admission.  Resume as needed CBG (last 3)  Recent Labs    10/03/20 1631 10/03/20 2110 10/04/20 0632  GLUCAP 133* 113* 140*     Controlled off meds 8/10   9.  CKD stage III.  Follow-up chemistries  10.  Hyperlipidemia.  Lipitor  11.  COPD/tobacco abuse. Pt states he quite smoking 5 wks PTA  Continue inhalers as directed.occ wheezing after therapy , advised pt to request prn inhaler   12.  Diastolic congestive heart failure.  No signs of fluid overload.  13.  Obesity.  BMI 35.25.  Dietary follow-up  14.  Incidental thyroid nodule measuring 1.5 cm.  Follow-up outpatient thyroid ultrasound 8.  Diabetes mellitus and peripheral neuropathy.  Hemoglobin A1c 7.4.  SSI.  Patient on Glucotrol 5 mg  daily, Glucophage 500 mg twice daily prior to admission.  Resume as needed  15.  Permissive hypertension.  Currently on Hygroton 25 mg daily.  Patient also had been on hydralazine 50 mg twice daily, Imdur 60 mg daily Cozaar 100 mg daily prior to admission.     Vitals:   10/03/20 2017 10/04/20 0608  BP: 134/85 122/81  Pulse: 79 71  Resp: 18 18  Temp: 98 F (36.7 C) 98.5 F (36.9 C)  SpO2: 99% 97%   Good control this am , slightly elevated this am , monitor on current meds   16. Had BM's 8/6 after sorbitol  -senna-s for mtc   LOS: 15 days A FACE TO [place] E Lian Tanori 10/04/2020, 8:02 AM

## 2020-10-04 NOTE — Progress Notes (Signed)
Physical Therapy Weekly Progress Note  Patient Details  Name: Craig Knight MRN: [id number] Date of Birth: 21-Apr-1965  Beginning of progress report period: September 27, 2020 End of progress report period: October 04, 2020  Today's Date: 10/04/2020 PT Individual Time: 1350-1502 PT Individual Time Calculation (min): 72 min   Patient has met 2 of 4 short term goals.  Mr. Bells is making steady progress with therapy demonstrating increasing independence with functional mobility. He is performing supine<>sit with min/mod assist, sit<>stands with min assist, and squat pivot bed<>chair transfers with min assist - continues to require manual facilitation for R LE management during transfers due to limited/no open chain muscle activation noted. He has been working on Personnel officer with +2 mod assist via 3 Musketeer support and progressed to gait training with RW today. He continues to require max/total assist for management of R LE gait mechanics during swing and then min/mod assist during stance to facilitate terminal knee extension, increased hip extension and increased R weight shift. Educated pt on recommendation for AFO consultation to be used for improved RLE stability during standing balance and for use with gait training. Added long term wheelchair mobility level goals as pt will require use of wheelchair to perform household level functional mobility at a supervision level to increase his independence. Planning for custom wheelchair consultation next week. Mr. Cirelli will continue to benefit from continued CIR level therapies to further advance independence with functional mobility until anticipated D/C of 8/20.  Patient continues to demonstrate the following deficits muscle weakness, muscle joint tightness, and muscle paralysis, decreased cardiorespiratoy endurance, impaired timing and sequencing, abnormal tone, unbalanced muscle activation, decreased coordination, and decreased motor planning,  hemianopsia,  , decreased initiation, decreased attention, decreased awareness, decreased problem solving, decreased safety awareness, decreased memory, and delayed processing, and decreased sitting balance, decreased standing balance, decreased postural control, hemiplegia, and decreased balance strategies and therefore will continue to benefit from skilled PT intervention to increase functional independence with mobility.  Patient progressing toward long term goals.. Added long term wheelchair mobility goals with focus on increasing pt's independence with functional mobility at a wheelchair level.   Continue plan of care.  PT Short Term Goals Week 2:  PT Short Term Goal 1 (Week 2): Pt will perform supine<>sit with min assist PT Short Term Goal 1 - Progress (Week 2): Progressing toward goal PT Short Term Goal 2 (Week 2): Pt will perform sit<>stands with min assist PT Short Term Goal 2 - Progress (Week 2): Met PT Short Term Goal 3 (Week 2): Pt will perform bed<>chair transfers with min assist PT Short Term Goal 3 - Progress (Week 2): Met PT Short Term Goal 4 (Week 2): Pt will ambulate at least 151f with +2 mod assist PT Short Term Goal 4 - Progress (Week 2): Progressing toward goal Week 3:  PT Short Term Goal 1 (Week 3): = to LTGs based on ELOS  Skilled Therapeutic Interventions/Progress Updates:  Ambulation/gait training;Community reintegration;DME/adaptive equipment instruction;Neuromuscular re-education;Psychosocial support;Stair training;UE/LE Strength taining/ROM;Wheelchair propulsion/positioning;Balance/vestibular training;Discharge planning;Functional electrical stimulation;Pain management;Skin care/wound management;Therapeutic Activities;UE/LE Coordination activities;Cognitive remediation/compensation;Disease management/prevention;Functional mobility training;Patient/family education;Splinting/orthotics;Therapeutic Exercise;Visual/perceptual remediation/compensation   Pt received sitting  in w/c with his wife, LLattie Haw present and pt agreeable to therapy session.   Therapist updated pt/family on the following:  - recommendation for custom wheelchair consultation as pt will require lightweight wheelchair to perform mod-I functional mobility in the home to increase independence and decrease caregiver burden (therapist reached out to vendor) - recommendation for ramped entry into  the home (discussed at minimum installing bilateral handrails on the 2STE if unable to set-up ramp by D/C date) - recommendation for follow-up OPPT - recommendation of having RW to assist with standing balance during ADLs; pt reports that he already has a RW - recommendation for R LE AFO consultation to provide pt increased stability for balance during standing tasks and for improved gait mechanics during gait training Pt/wife in agreement with these recommendations and no further questions at this time.  Pt completed oral care at sink with set-up assist and using compensatory strategies for bimanual tasks. Donned B LE TED hose and shoes total assist. Transported to/from gym in w/c for time management and energy conservation.  Provided pt with elbow guard for protection due to bruising noted around that area - educated pt on purpose.   Provided pt with R hand orthotic for RW and educated pt on purpose and use. Donned R LE DF assist ACE wrap. Sit>stands from w/c to RW, cuing not to pull up on RW with L UE; min assist for balance. Gait training ~79f, ~662f and ~302fsing RW with +2 assist for AD management and safety/balance as needed with mod assist from thearpist for R LE gait mechanics and balance - requires max/total assist to advance R LE during swing, donned shoe toe cap during 2nd walk to decrease friction allowing pt increased ability to advance limb - continues to require tactile/verbal cuing and manual facilitation to achieve terminal R knee extension and increased hip extension with R weight shift during  stance - intermittent cuing for increased trunk extension for upright posture.   L hemi-technique w/c propulsion ~24f7fth supervision. Education on proper set-up of wheelchair for squat pivot transfer to mat. Block practice squat pivot transfer w/c<>EOM targeting motor planning, sequencing, and head hips relationship - requires min assist in both directions though mod/max cuing for sequencing to normalize movement patterns and avoid compensations. Transported back to room and pt agreeable to remain sitting up in w/c - left with needs in reach, seat belt alarm on, and his wife present.    Therapy Documentation Precautions:  Precautions Precautions: Fall, Other (comment) Precaution Comments: L hemianopsia; dense R hemiparesis; Zio Patch Restrictions Weight Bearing Restrictions: No   Pain:  Reports R ankle tenderness to touch when therapist facilitating gait mechanics therefore repositioned facilitation to avoid that area - noted some bruising around R lateral malleolus - ensured ankle DF ACE wrap providing ankle support during session.   Therapy/Group: Individual Therapy  CarlTawana ScaleT, DPT, NCS, CSRS 10/04/2020, 12:28 PM

## 2020-10-04 NOTE — Progress Notes (Signed)
Orthopedic Tech Progress Note Patient Details:  MINTON RAVENSCROFT 1965-03-22 [id number]  Called in order to [facility] for an AFO CONSULT   Patient ID: AKIM STEGER, male   DOB: May 12, 1965, 55 y.o.   MRN: [id number]  Janit Pagan 10/04/2020, 11:33 AM

## 2020-10-04 NOTE — Progress Notes (Signed)
Patient ID: Craig Knight, male   DOB: 16-Jul-1965, 55 y.o.   MRN: [id number] Team Conference Report to Patient/Family  Team Conference discussion was reviewed with the patient and caregiver, including goals, any changes in plan of care and target discharge date.  Patient and caregiver express understanding and are in agreement.  The patient has a target discharge date of 10/14/20.  SW met with patient, called spouse at bedside. Provided conference updates. SW followed up with patient spouse of preference of follow up at discharge. Patient spouse reports she is unsure how often she will be able to get patient out to appointments. Her daughter potentially will be able to assist. Patient and spouse have agreed on [facility] at the moment. Family will continue to discuss transportation. If able to arrange consistent transportation family will let SW know by Friday. As of now patient set and established with HH. SW will continue to follow up.  Dyanne Iha 10/04/2020, 1:11 PM

## 2020-10-04 NOTE — Plan of Care (Signed)
  Problem: RH Wheelchair Mobility Goal: LTG Patient will propel w/c in controlled environment (PT) Description: LTG: Patient will propel wheelchair in controlled environment, # of feet with assist (PT) Flowsheets (Taken 10/04/2020 1828) LTG: Pt will propel w/c in controlled environ  assist needed:: (goal added targeting increased pt independence with functional mobility) Supervision/Verbal cueing LTG: Propel w/c distance in controlled environment: 130f Note: goal added targeting increased pt independence with functional mobility Goal: LTG Patient will propel w/c in home environment (PT) Description: LTG: Patient will propel wheelchair in home environment, # of feet with assistance (PT). Flowsheets (Taken 10/04/2020 1828) LTG: Pt will propel w/c in home environ  assist needed:: (goal added targeting increased pt independence with functional mobility) Supervision/Verbal cueing LTG: Propel w/c distance in home environment: 572fNote: goal added targeting increased pt independence with functional mobility

## 2020-10-04 NOTE — Progress Notes (Signed)
Occupational Therapy Weekly Progress Note  Patient Details  Name: Craig Knight MRN: [id number] Date of Birth: Jul 12, 1965  Beginning of progress report period: September 20, 2020 End of progress report period: October 04, 2020  Today's Date: 10/04/2020 OT Individual Time: 3343-5686 OT Individual Time Calculation (min): 75 min    Patient has met 2 of 3 short term goals.  Pt is making slower but steady progress with OT at this time.  He is able to complete UB selfcare with min assist and LB selfcare sit to stand with mod assist.  Still exhibits decreased overall endurance for all tasks, requiring increased rest breaks.  Also with flat affect and depressed mood.  LUE function is still at Brunnstrum stage II in the right arm and stage I in the hand.  He needs max hand over hand assist for integration of the LUE for washing the right arm.  Transfers are mod assist overall stand pivot with min assist for squat pivot.  He continues to demonstrate left visual field deficit as well from previous CVA.  Feel he is progressing, but RUE and RLE strength continues to be significantly impaired.  Feel he will benefit from continued CIR level OT to progress toward overall min assist level goals.  Expected discharge on 8/20.    Patient continues to demonstrate the following deficits: muscle weakness, muscle joint tightness, and muscle paralysis, impaired timing and sequencing, abnormal tone, unbalanced muscle activation, and decreased coordination, field cut and hemianopsia, decreased attention, decreased awareness, decreased memory, and delayed processing, and decreased sitting balance, decreased standing balance, decreased postural control, hemiplegia, and decreased balance strategies and therefore will continue to benefit from skilled OT intervention to enhance overall performance with BADL and Reduce care partner burden.  Patient progressing toward long term goals..  Continue plan of care.  OT Short Term Goals Week  3:  OT Short Term Goal 1 (Week 3): Continue working on established LTGs set at min assist overall.  Skilled Therapeutic Interventions/Progress Updates:    Pt in wheelchair to start session with flat affect overall.  When asked about his mood, he reports being sleepy from meds last night and down secondary to having to continue to stay in the hospital until expected discharge.  Took pt down to the therapy gym with mod assist transfer to the therapy mat stand pivot.  Worked on scapular mobilizations to start with therapist assist.  He then worked on scapular elevation and adduction for 1 set of 10 reps each with overall max assist.  He was able to then work on RUE weightbearing on the mat with placement of the right hand while working on weightshifts to the right with elbow flexion to elbow extension with max facilitation.  Incorporated some reaching to the right with the LUE as well while working on supporting himself with the RUE.  Completed several repetitions with min to mod for balance.  Transitioned to working on active movement of internal rotation with right hand on therapy ball over flat surface.  Pt able to exhibit trace internal rotation with some compensation in his body with lean to the left.  Returned to the wheelchair after completion of task with return to the room so pt could wash his face and his buttocks.  Min assist for standing to clean his buttocks and apply lotion.  He was able to wash his face with setup from the wheelchair level.  Finished session with pt in the wheelchair and with the safety belt in place.  NMES was applied to the right shoulder for treatment of subluxation for 60 mins without any adverse reactions.  Intensity on level 8 at the parameters listed below.  No pain or adverse reactions noted in the arm.    Saebo Stim One 330 pulse width 35 Hz pulse rate On 8 sec/ off 8 sec Ramp up/ down 2 sec Symmetrical Biphasic wave form  Max intensity 113m at 500 Ohm  load   Therapy Documentation Precautions:  Precautions Precautions: Fall, Other (comment) Precaution Comments: L hemianopsia; dense R hemiparesis; Zio Patch Restrictions Weight Bearing Restrictions: No   Pain: Pain Assessment Pain Scale: Faces Pain Score: 0-No pain ADL: See Care Tool Section for some details of mobility and selfcare  Therapy/Group: Individual Therapy  Mohit Zirbes OTR/L 10/04/2020, 10:35 AM

## 2020-10-04 NOTE — Patient Care Conference (Signed)
Inpatient RehabilitationTeam Conference and Plan of Care Update Date: 10/04/2020   Time: 10:56 AM    Patient Name: Craig Knight      Medical Record Number: [id number]  Date of Birth: 03-09-1965 Sex: Male         Room/Bed: 4M05C/4M05C-01 Payor Info: Payor: Marine scientist / Plan: [employer] MEDICARE / Product Type: *No Product type* /    Admit Date/Time:  09/19/2020  3:19 PM  Primary Diagnosis:  Infarction of left basal ganglia [facility])  Hospital Problems: Principal Problem:   Infarction of left basal ganglia (HCC) Active Problems:   DM type 2 (diabetes mellitus, type 2) ([place])   CKD (chronic kidney disease), stage III [facility])    Expected Discharge Date: Expected Discharge Date: 10/14/20  Team Members Present: Physician leading conference: Dr. Alysia Penna Social Worker Present: [name], BSW Nurse Present: Dorien Chihuahua, RN PT Present: Page Spiro, PT OT Present: Clyda Greener, OT SLP Present: Sherren Kerns, SLP PPS Coordinator present : Gunnar Fusi, SLP     Current Status/Progress Goal Weekly Team Focus  Bowel/Bladder   continent of bowel and bladder LBM 10/03/20 pt refuses stool softners  maintain continence      Swallow/Nutrition/ Hydration   Dys 3 diet, thin liquids. Mod I for implementation of swallow strategies (lingual sweep to clear right buccal cavity). Patient preference for Dys 3 diet, thin liquids. at this time  Mod I  Continue use of strategies. Cont reg trials as tolerated. May not advance due to patient preference   ADL's   Min assist for LB bathing sit to stand with mod assist for LB dressing.  Min asssit for UB dressing with min to mod for transfers squat pivot.  RUE Brunnstrum stage II in the arm with stage I in the hand.  Trace shoulder movement noted only.  left visual field deficit  min assist overall  selfcare retraining, transfer training, neuromuscular re-education, NMES, balance retraining, therapeutic exercise, pt/family education    Mobility   mod assist bed mobility, min assist sit<>stands, mod assist squat pivot, +2 mod assist gait up to 80f via 3 Musketeer support - pt demos activaition of glutes and quads for knee control during stance with mod assist but requires max assist to advance R LE during swing - L hemi-technique w/c propulsion up to 946fwith supervision  min/mod assist overall at ambulatory level  activity tolerance, R LE NMR, transfer training, gait training, bed mobility training, dynamic sitting and standing balance, pt/family education   Communication   min-to-mod A to implement speech intelligiblity strategies  Sup A  speech intelligibility at sentence and conversation level   Safety/Cognition/ Behavioral Observations  min A  Min A (baseline deficits per spouse)  Recall, use of memory strategies, problem solving, emergent/anticipatory awareness   Pain   pt denies pain         Skin   skin is intact  skin to remain intact        Discharge Planning:  Discharging home with spouse   Team Discussion: Note clonus right side. BP is controlled. Intermittent constipation addressed and depression.  Left visual field cut from previous stroke.  Patient on target to meet rehab goals: yes, currently min assist for upper body bathing and dressing and min - mod assist for lower body care. Able to complete sit to stand with min assist and squat pivot with mod assist.  Note trace shoulder movement with internal rotation. Improved right lower extremity activation and able to control extremity  advancement. Goals for discharge set at min asist and supervision for cognition.  *See Care Plan and progress notes for long and short-term goals.   Revisions to Treatment Plan:  Working on motivation, mood,  and communication of thoughts.  Wife reports cognitively at baseline; working on recall, problem solving and speech intelligibility.  Teaching Needs: Safety, medications, secondary stroke risk management, etc  Current  Barriers to Discharge: Decreased caregiver support, Home enviroment access/layout, and Behavior  Possible Resolutions to Barriers: Family education with wife HH follow up due to transportation issues; recommend OP follow up if possible transportation Hand rail installation for entry to home AFO and DME recommendations     Medical Summary Current Status: Hip pain improved, hypertension improving, has depressed mood with adjustment issues.  No motor recovery thus far  Barriers to Discharge: Medical stability   Possible Resolutions to Barriers/Weekly Focus: Initiate electrical stimulation.  Neuropsych evaluation   Continued Need for Acute Rehabilitation Level of Care: The patient requires daily medical management by a physician with specialized training in physical medicine and rehabilitation for the following reasons: Direction of a multidisciplinary physical rehabilitation program to maximize functional independence : Yes Medical management of patient stability for increased activity during participation in an intensive rehabilitation regime.: Yes Analysis of laboratory values and/or radiology reports with any subsequent need for medication adjustment and/or medical intervention. : Yes   I attest that I was present, lead the team conference, and concur with the assessment and plan of the team.   Dorien Chihuahua B 10/04/2020, 12:42 PM

## 2020-10-05 ENCOUNTER — Ambulatory Visit: Payer: Medicare Other | Admitting: Physical Therapy

## 2020-10-05 ENCOUNTER — Encounter: Payer: Medicare Other | Admitting: Occupational Therapy

## 2020-10-05 LAB — GLUCOSE, CAPILLARY
Glucose-Capillary: 122 mg/dL — ABNORMAL HIGH (ref 70–99)
Glucose-Capillary: 108 mg/dL — ABNORMAL HIGH (ref 70–99)
Glucose-Capillary: 112 mg/dL — ABNORMAL HIGH (ref 70–99)
Glucose-Capillary: 139 mg/dL — ABNORMAL HIGH (ref 70–99)

## 2020-10-05 MED ORDER — SORBITOL 70 % SOLN: 30.0000 mL | Freq: Every day | Status: DC | PRN

## 2020-10-05 MED ORDER — SENNOSIDES-DOCUSATE SODIUM 8.6-50 MG PO TABS
2.0000 | ORAL_TABLET | Freq: Two times a day (BID) | ORAL | Status: DC
Start: 2020-10-05 — End: 2020-10-13
  Administered 2020-10-05 – 2020-10-13 (×9): 2 via ORAL
  Filled 2020-10-05 (×16): qty 2

## 2020-10-05 NOTE — Progress Notes (Signed)
Physical Therapy Session Note  Patient Details  Name: Craig Knight MRN: [id number] Date of Birth: 03-Apr-1965  Today's Date: 10/05/2020 PT Individual Time: 1105-1202 PT Individual Time Calculation (min): 57 min   Short Term Goals: Week 3:  PT Short Term Goal 1 (Week 3): = to LTGs based on ELOS  Skilled Therapeutic Interventions/Progress Updates:    Pt received sitting in w/c and agreeable to therapy session.  Transported to/from gym in w/c for time management and energy conservation. Pt still wearing R elbow guard, re-educated to doff at night.   Sit<>stands with min assist for balance during session - cuing to decrease L UE support for increased R LE WBing and NMR during transfers - tactile/verbal cuing for increased R knee extension. When using RW requires total assist to place R hand on/off RW orthotic.  Donned R LE DF assist ACE wrap and shoe cover to create toe-cap for decreased friction during swing phase advancement. Gait training ~39f using RW (R hand orthosis) with +2 assist for AD management and intermittent balance assistance - requires mod assist from therapist for balance and max/total assist for RLE gait mechanics - pt able to initiate advancement during swing using compensatory strategies of L trunk lean and attempted R hip hiking requiring max/total assist to advance for reciprocal pattern - requires max assist to manually facilitate increased R hip/knee extension during stance with pt demoing worsening R posteriorly rotated pelvis today.  Dynamic standing balance with L HHA via +2 assist while performing R LE NMR task targeting increased hip extension and terminal knee extension while performing L foot taps on/off 4" step - mod assist for balance with therapist maximally facilitating improved R LE alignment and extension positioning - x10reps.  Gait training ~39fusing LHHobarts believe RW is causing pt to have excessive trunk/hip flexion resulting in worsening pelvic alignment  and decreased R LE extension during stance - continues to require mod assist from therapist for balance and slightly less max assist for R LE gait mechanics with improved stance phase extension.  Stair navigation training targeting R LE NMR via ascending/descending 4 steps using BHRs (+2 assist facilitating R hand placement on rail) via step-to pattern leading with L LE on ascent and R LE on descent - +2 for safety and then min/mod assist from therapist for balance - requires total assist to bring R LE up onto step during ascent and then max assist to position R LE during descent - therapist blocking R knee throughout for safety but no buckling noted.  Transitioned to R LE NMR task using stairs via L foot step-up onto 1st step then bringing R LE up onto 2nd step for increased hip/knee flexion NMR; however, pt requires total assist to place R foot on/off 2nd step.   Pt reports he feels like his L eye vision is having increased blurriness. Vitals assessed: BP 124/92 (MAP 102), HR 73bpm  Pt reports he wants to continue with session and denies any additional symptoms - no other changes in status noted.  Performed R LE NMR via repeated R LE step up/down on/off 1st 6" step with  BHR support (+2 facilitating R hand placement on HR) and heavy mod assist for balance and manually facilitating increased R hip/knee extension when stepping up.  Transported back to room and pt agreeable to remain sitting up in w/c - left with needs in reach, RUE supported on 1/2 lap tray, and seat belt alarm on.   Therapy Documentation Precautions:  Precautions Precautions:  Fall, Other (comment) Precaution Comments: L hemianopsia; dense R hemiparesis; Zio Patch Restrictions Weight Bearing Restrictions: No   Pain: Reports exercises are "tiresome" but no complaints of pain.    Therapy/Group: Individual Therapy  Tawana Scale , PT, DPT, NCS, CSRS 10/05/2020, 7:59 AM

## 2020-10-05 NOTE — Progress Notes (Signed)
Occupational Therapy Session Note  Patient Details  Name: Craig Knight MRN: [id number] Date of Birth: [birth year]  Today's Date: 10/05/2020 OT Individual Time: [id number] OT Individual Time Calculation (min): 58 min    Short Term Goals: Week 3:  OT Short Term Goal 1 (Week 3): Continue working on established LTGs set at min assist overall.  Skilled Therapeutic Interventions/Progress Updates:    Session 1: [phone]) Pt worked on bathing and dressing during session.  Mod assist for stand pivot transfer from wheelchair to the tub bench.  He was able to complete UB bathing with min assist and then LB bathing with min assist as well with integration of the LH sponge for washing his feet.  He needed max hand over hand assist for integration of the RUE as a stabilizer for holding the washcloth and as an active assist for washing the left arm.  He was able to transfer to the wheelchair at [facility] assist level for dressing at the sink.  Mod assist for donning underpants and pants as well as for his pullover shirt.  Min assist for sit to stand when pulling the items up over his hips.  Dependency for TEDs and shoes to complete session.  He was left sitting in the wheelchair with the call button and phone in reach and safety alarm belt in place.    Session 2:  Pt in wheelchair to start, took him down to the therapy gym for work in standing to increase weightbearing over the RUE and RLE.  Worked on sit to stand transitions with min to mod assist without use of his UEs and with greater midline orientation instead of shifting his weight to the LLE.  He was able to maintain standing with overall min assist with the RUE in weightbearing while engaged in playing checkers.  Standing endurance for 2-3 mins before needing to sit down and rest.  Mod facilitation to maintain right knee extension while standing.  He completed several intervals with return to the room at end of session.  NMES applied to the right shoulder at  end of session for treatment of subluxation.  He was able to tolerate 60 mins of stimulation at level 9 resistance.  No adverse reactions noted with stimulation at the below parameters.  Finished session with call button and phone in reach and safety belt in place.  Discussed DME needs with pt and spouse as well.  Will order drop arm commode.   [place] Stim One 330 pulse width 35 Hz pulse rate On 8 sec/ off 8 sec Ramp up/ down 2 sec Symmetrical Biphasic wave form  Max intensity 168m at 500 Ohm load  Therapy Documentation Precautions:  Precautions Precautions: Fall, Other (comment) Precaution Comments: L hemianopsia; dense R hemiparesis; Zio Patch Restrictions Weight Bearing Restrictions: No  Pain: Pain Assessment Pain Scale: 0-10 Pain Score: 0-No pain Faces Pain Scale: No hurt ADL: See Care Tool Section for some details of mobility and selfcare  Therapy/Group: Individual Therapy  Bao Coreas OTR/L 10/05/2020, 12:13 PM

## 2020-10-05 NOTE — Progress Notes (Signed)
PROGRESS NOTE   Subjective/Complaints:  ROS: Patient denies CP, SOB, N/V/D Objective:   No results found. No results for input(s): WBC, HGB, HCT, PLT in the last 72 hours. No results for input(s): NA, K, CL, CO2, GLUCOSE, BUN, CREATININE, CALCIUM in the last 72 hours.  Intake/Output Summary (Last 24 hours) at 10/05/2020 0705 Last data filed at 10/04/2020 1810 Gross per 24 hour  Intake 600 ml  Output --  Net 600 ml         Physical Exam: Vital Signs Blood pressure (!) 131/100, pulse 74, temperature 98.5 F (36.9 C), temperature source Oral, resp. rate 16, height '5\' 7"'$  (1.702 m), weight 92.1 kg, SpO2 98 %.  General: No acute distress Mood and affect are appropriate Heart: Regular rate and rhythm no rubs murmurs or extra sounds Lungs: Clear to auscultation, breathing unlabored, no rales or wheezes Abdomen: Positive bowel sounds, soft nontender to palpation, nondistended Extremities: No clubbing, cyanosis, or edema  Skin: No evidence of breakdown, no evidence of rash Neurologic: dysarthric. Cranial nerves II through XII intact, motor strength is 5/5 in Left and remains 0/5 right deltoid, bicep, tricep, grip, hip flexor, knee extensors, ankle dorsiflexor and plantar flexor Sensory exam normal sensation to light touch and proprioception in bilateral upper and lower extremities Tone- clonus RIght pronators  Musculoskeletal: Full range of motion in all 4 extremities. No joint swelling   Assessment/Plan: 1. Functional deficits which require 3+ hours per day of interdisciplinary therapy in a comprehensive inpatient rehab setting. Physiatrist is providing close team supervision and 24 hour management of active medical problems listed below. Physiatrist and rehab team continue to assess barriers to discharge/monitor patient progress toward functional and medical goals  Care Tool:  Bathing    Body parts bathed by patient:  Chest, Abdomen, Right lower leg, Left lower leg, Right upper leg, Left upper leg, Front perineal area, Buttocks, Face   Body parts bathed by helper: Right arm, Left arm     Bathing assist Assist Level: Minimal Assistance - Patient > 75%     Upper Body Dressing/Undressing Upper body dressing   What is the patient wearing?: Pull over shirt    Upper body assist Assist Level: Minimal Assistance - Patient > 75%    Lower Body Dressing/Undressing Lower body dressing      What is the patient wearing?: Pants, Underwear/pull up     Lower body assist Assist for lower body dressing: Moderate Assistance - Patient 50 - 74%     Toileting Toileting    Toileting assist Assist for toileting: Set up assist Assistive Device Comment: urinal   Transfers Chair/bed transfer  Transfers assist     Chair/bed transfer assist level: Minimal Assistance - Patient > 75% (squat pivot)     Locomotion Ambulation   Ambulation assist   Ambulation activity did not occur: Safety/medical concerns  Assist level: 2 helpers (mod A of 1 and +2 AD management) Assistive device: Walker-rolling Max distance: 22f   Walk 10 feet activity   Assist  Walk 10 feet activity did not occur: Safety/medical concerns  Assist level: 2 helpers Assistive device: Other (comment) (mod A of 1 and +2 AD management)  Walk 50 feet activity   Assist Walk 50 feet with 2 turns activity did not occur:  (not turning yet)         Walk 150 feet activity   Assist Walk 150 feet activity did not occur: Safety/medical concerns         Walk 10 feet on uneven surface  activity   Assist Walk 10 feet on uneven surfaces activity did not occur: Safety/medical concerns         Wheelchair     Assist Will patient use wheelchair at discharge?:  (TBD) Type of Wheelchair: Manual    Wheelchair assist level: Supervision/Verbal cueing Max wheelchair distance: 64'    Wheelchair 50 feet with 2 turns  activity    Assist        Assist Level: Supervision/Verbal cueing   Wheelchair 150 feet activity     Assist      Assist Level: Dependent - Patient 0%   Blood pressure (!) 131/100, pulse 74, temperature 98.5 F (36.9 C), temperature source Oral, resp. rate 16, height '5\' 7"'$  (1.702 m), weight 92.1 kg, SpO2 98 %.    A/P  1.  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 Refused loop recorder and ZIO patch added. No UE or LE movement so far on RIght side              -patient may  shower             -ELOS/Goals: 8/20,   Continue CIR  2.  Antithrombotics:  -DVT/anticoagulation: Lovenox              -antiplatelet therapy: Aspirin 81 mg daily and Brilinta 90 mg twice daily for 30 days then back to aspirin and Plavix DAPT  3. Pain Management: Neurontin 300 mg nightly   4. Mood/reactive depression/sleep: Trazodone 100 mg nightly, having some adjustment issues , would like neuropsych to follow , some hangover effect with higher trazodone dose will reduce to '50mg'$   Irritable at times , on celexa '10mg'$  per day             -antipsychotic agents: N/A  5. Neuropsych: This patient is capable of making decisions on his own behalf.  6. Skin/Wound Care: Routine skin checks  7. Fluids/Electrolytes/Nutrition: Routine in and outs with follow-up chemistries  8.  Diabetes mellitus and peripheral neuropathy.  Hemoglobin A1c 7.4.  SSI.  Patient on Glucotrol 5 mg daily, Glucophage 500 mg twice daily prior to admission.  Resume as needed CBG (last 3)  Recent Labs    10/04/20 1647 10/04/20 2105 10/05/20 0627  GLUCAP 130* 130* 122*    Controlled off meds 8/11   9.  CKD stage III.  Follow-up chemistries  10.  Hyperlipidemia.  Lipitor  11.  COPD/tobacco abuse. Pt states he quite smoking 5 wks PTA  Continue inhalers as directed.occ wheezing after therapy ,  advised pt to request prn inhaler   12.  Diastolic congestive heart failure.  No signs of fluid overload.  13.  Obesity.  BMI 35.25.  Dietary follow-up  14.  Incidental thyroid nodule measuring 1.5 cm.  Follow-up outpatient thyroid ultrasound 8.  Diabetes mellitus and peripheral neuropathy.  Hemoglobin A1c 7.4.  SSI.  Patient on Glucotrol 5 mg daily, Glucophage 500 mg twice daily prior to admission.  Resume as needed  15.  Permissive hypertension.  Currently on Hygroton 25 mg daily.  Patient also had  been on hydralazine 50 mg twice daily, Imdur 60 mg daily Cozaar 100 mg daily prior to admission.     Vitals:   10/04/20 2015 10/05/20 0612  BP: 124/83 (!) 131/100  Pulse: 72 74  Resp: 18 16  Temp: (!) 97.5 F (36.4 C) 98.5 F (36.9 C)  SpO2: 0000000 [id number]  Diastolic elevation this am cont to monitor on current meds   16. Had BM's 8/6 after sorbitol, last BM 8/7 repeat sorbitol and increase senna S to BID  -senna-s for mtc   LOS: 16 days A FACE TO FACE EVALUATION WAS PERFORMED  Charlett Blake 10/05/2020, 7:05 AM

## 2020-10-05 NOTE — Progress Notes (Signed)
Patient ID: Craig Knight, male   DOB: Nov 29, 1965, 55 y.o.   MRN: [id number] Spoke with pt and wife was face timing pt to discuss ramp she reports he will have one by discharge. They want to do OP at [facility] where they live. Will fax order to them and told wife will contact her to set up appointments.

## 2020-10-06 LAB — GLUCOSE, CAPILLARY
Glucose-Capillary: 97 mg/dL (ref 70–99)
Glucose-Capillary: 107 mg/dL — ABNORMAL HIGH (ref 70–99)
Glucose-Capillary: 101 mg/dL — ABNORMAL HIGH (ref 70–99)
Glucose-Capillary: 116 mg/dL — ABNORMAL HIGH (ref 70–99)

## 2020-10-06 NOTE — Progress Notes (Signed)
Occupational Therapy Session Note  Patient Details  Name: Craig Knight MRN: [id number] Date of Birth: 12/12/1965  Today's Date: 10/06/2020 OT Individual Time: [id number] OT Individual Time Calculation (min): 55 min    Short Term Goals: Week 3:  OT Short Term Goal 1 (Week 3): Continue working on established LTGs set at min assist overall.  Skilled Therapeutic Interventions/Progress Updates:    Pt in wheelchair to start agreeable to session.  Took him down to the ortho gym for work on standing balance and visual scanning using the BITS.  He was able to stand with overall min assist and RUE placed in weightbearing on the beside table.  Min assist to maintain left knee extension and static standing balance while using the LUE to locate items on the screen.  He completed Visual Scanning Program with 90% accuracy and an average of 2.1 seconds.  He was able to complete Visual Pursuit Program using Rotator in average of 5.3 seconds in 2 mins 48 seconds for numbers 1-30.  Visual Memory Test was completed next with pt demonstrating only 3-4 word memory recall for 2 minute intervals.  Finished session with return to the room with call button and phone in reach and safety alarm in place.    Therapy Documentation Precautions:  Precautions Precautions: Fall, Other (comment) Precaution Comments: L hemianopsia; dense R hemiparesis Restrictions Weight Bearing Restrictions: No  Pain: Pain Assessment Pain Scale: Faces Pain Score: 0-No pain Pain Location: Hand Pain Orientation: Right Pain Intervention(s): Refused ADL: See Care Tool Section for some details of mobility and selfcare   Therapy/Group: Individual Therapy  Evon Lopezperez OTR/L  10/06/2020, 12:54 PM

## 2020-10-06 NOTE — Progress Notes (Signed)
Inpatient Rehabilitation Care Coordinator Discharge Note  The overall goal for the admission was met for:   Discharge location: Yes. home  Length of Stay: Yes, 25 Days  Discharge activity level: Yes. wheelchair level with supervision and requiring Min Assist assist for squat pivot transfers  Home/community participation: Yes. Limited.  Services provided included: MD, RD, PT, OT, SLP, RN, CM, TR, Pharmacy, Neuropsych, and SW  Financial Services: Private Insurance: [facility] Medicare  Choices offered to/list presented to:pt  Follow-up services arranged: OP- [facility]  Comments (or additional information): PT OT ST Custom chair- [employer]; drop arm commode, rolling walker with [place]  Patient/Family verbalized understanding of follow-up arrangements: Yes  Individual responsible for coordination of the follow-up plan: [name] [phone]  Confirmed correct DME delivered: Dyanne Iha 10/06/2020    Dyanne Iha

## 2020-10-06 NOTE — Progress Notes (Signed)
PROGRESS NOTE   Subjective/Complaints: Complains about right hand pain and tremor, nurses not being able to spend enough time with him although they come quickly. Discussed propanolol for hand tremor but he says it is intermittent and does not feel he needs it  ROS: Patient denies CP, SOB, N/V/D, +right hand tremor  Objective:   No results found. No results for input(s): WBC, HGB, HCT, PLT in the last 72 hours. No results for input(s): NA, K, CL, CO2, GLUCOSE, BUN, CREATININE, CALCIUM in the last 72 hours.  Intake/Output Summary (Last 24 hours) at 10/06/2020 0908 Last data filed at 10/06/2020 0818 Gross per 24 hour  Intake 165 ml  Output 400 ml  Net -235 ml        Physical Exam: Vital Signs Blood pressure (!) 146/67, pulse 61, temperature 98.4 F (36.9 C), resp. rate 18, height '5\' 7"'$  (1.702 m), weight 92.1 kg, SpO2 98 %. Gen: no distress, normal appearing HEENT: oral mucosa pink and moist, NCAT Cardio: Reg rate Chest: normal effort, normal rate of breathing Abd: soft, non-distended Ext: no edema Psych: pleasant, normal affect  Skin: No evidence of breakdown, no evidence of rash Neurologic: dysarthric. Cranial nerves II through XII intact, motor strength is 5/5 in Left and remains 0/5 right deltoid, bicep, tricep, grip, hip flexor, knee extensors, ankle dorsiflexor and plantar flexor Sensory exam normal sensation to light touch and proprioception in bilateral upper and lower extremities Tone- clonus RIght pronators  Musculoskeletal: Full range of motion in all 4 extremities. No joint swelling   Assessment/Plan: 1. Functional deficits which require 3+ hours per day of interdisciplinary therapy in a comprehensive inpatient rehab setting. Physiatrist is providing close team supervision and 24 hour management of active medical problems listed below. Physiatrist and rehab team continue to assess barriers to  discharge/monitor patient progress toward functional and medical goals  Care Tool:  Bathing    Body parts bathed by patient: Chest, Abdomen, Right lower leg, Left lower leg, Right upper leg, Left upper leg, Front perineal area, Buttocks, Face   Body parts bathed by helper: Right arm, Left arm     Bathing assist Assist Level: Minimal Assistance - Patient > 75%     Upper Body Dressing/Undressing Upper body dressing   What is the patient wearing?: Pull over shirt    Upper body assist Assist Level: Moderate Assistance - Patient 50 - 74%    Lower Body Dressing/Undressing Lower body dressing      What is the patient wearing?: Pants, Underwear/pull up     Lower body assist Assist for lower body dressing: Moderate Assistance - Patient 50 - 74%     Toileting Toileting    Toileting assist Assist for toileting: Minimal Assistance - Patient > 75% (for use of the urinal) Assistive Device Comment: urinal   Transfers Chair/bed transfer  Transfers assist     Chair/bed transfer assist level: Minimal Assistance - Patient > 75% (squat pivot)     Locomotion Ambulation   Ambulation assist   Ambulation activity did not occur: Safety/medical concerns  Assist level: 2 helpers (mod A of 1 and +2 for AD management) Assistive device: Walker-rolling Max distance: 65f  Walk 10 feet activity   Assist  Walk 10 feet activity did not occur: Safety/medical concerns  Assist level: 2 helpers (mod A of 1 and +2 for AD management) Assistive device: Walker-rolling   Walk 50 feet activity   Assist Walk 50 feet with 2 turns activity did not occur:  (not turning yet)         Walk 150 feet activity   Assist Walk 150 feet activity did not occur: Safety/medical concerns         Walk 10 feet on uneven surface  activity   Assist Walk 10 feet on uneven surfaces activity did not occur: Safety/medical concerns         Wheelchair     Assist Will patient use  wheelchair at discharge?:  (TBD) Type of Wheelchair: Manual    Wheelchair assist level: Supervision/Verbal cueing Max wheelchair distance: 18'    Wheelchair 50 feet with 2 turns activity    Assist        Assist Level: Supervision/Verbal cueing   Wheelchair 150 feet activity     Assist      Assist Level: Dependent - Patient 0%   Blood pressure (!) 146/67, pulse 61, temperature 98.4 F (36.9 C), resp. rate 18, height '5\' 7"'$  (1.702 m), weight 92.1 kg, SpO2 98 %.    A/P  1.  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 Refused loop recorder and ZIO patch added. No UE or LE movement so far on RIght side              -patient may  shower             -ELOS/Goals: 8/20,   Continue CIR  2.  Impaired mobility  -DVT/anticoagulation: continue Lovenox              -antiplatelet therapy: Aspirin 81 mg daily and Brilinta 90 mg twice daily for 30 days then back to aspirin and Plavix DAPT  3. Neuropathic pain: Continue Neurontin 300 mg nightly   4. Mood/reactive depression/insomnia: Continue Trazodone 100 mg nightly, having some adjustment issues , would like neuropsych to follow , some hangover effect with higher trazodone dose will reduce to '50mg'$   Irritable at times , on celexa '10mg'$  per day             -antipsychotic agents: N/A  5. Neuropsych: This patient is capable of making decisions on his own behalf.  6. Skin/Wound Care: Routine skin checks  7. Fluids/Electrolytes/Nutrition: Routine in and outs with follow-up chemistries  8.  Diabetes mellitus and peripheral neuropathy.  Hemoglobin A1c 7.4.  SSI.  Patient on Glucotrol 5 mg daily, Glucophage 500 mg twice daily prior to admission.  Resume as needed CBG (last 3)  Recent Labs    10/05/20 1635 10/05/20 2045 10/06/20 0604  GLUCAP 112* 139* 97   Controlled off meds 8/12   9.  CKD stage  III.  Follow-up chemistries  10.  Hyperlipidemia.  Lipitor  11.  COPD/tobacco abuse. Pt states he quite smoking 5 wks PTA  Continue inhalers as directed.occ wheezing after therapy , advised pt to request prn inhaler   12.  Diastolic congestive heart failure.  No signs of fluid overload.  13.  Obesity.  BMI 35.25.  Dietary follow-up  14.  Incidental thyroid nodule measuring 1.5 cm.  Follow-up outpatient thyroid ultrasound 8.  Diabetes mellitus and peripheral neuropathy.  Hemoglobin A1c 7.4.  SSI.  Patient on Glucotrol 5 mg daily, Glucophage 500 mg twice daily prior to admission.  Resume as needed  15.  Permissive hypertension.  Currently on Hygroton 25 mg daily.  Patient also had been on hydralazine 50 mg twice daily, Imdur 60 mg daily Cozaar 100 mg daily prior to admission.     Vitals:   10/06/20 0813 10/06/20 0819  BP:    Pulse:    Resp:    Temp:    SpO2: [id number]  Diastolic elevation this am cont to monitor on current meds   16. Had BM's 8/6 after sorbitol, last BM 8/7 repeat sorbitol and increase senna S to BID  -senna-s for mtc  17. Right hand tremor, intermittent, with activity: discussed propanolol but he does not feel he needs at this time.    LOS: 17 days A FACE TO FACE EVALUATION WAS PERFORMED  Craig Knight P Odies Desa 10/06/2020, 9:08 AM

## 2020-10-06 NOTE — Progress Notes (Signed)
Patient ID: Craig Knight, male   DOB: Jan 25, 1966, 55 y.o.   MRN: [id number]  Signed handicapped placement card dropped off to patient.   [place], [place]

## 2020-10-06 NOTE — Progress Notes (Signed)
Patient ID: Craig Knight, male   DOB: 01-26-1966, 55 y.o.   MRN: [id number]  Drop arm commode ordered through [employer].  [place], [place]

## 2020-10-06 NOTE — Progress Notes (Signed)
Physical Therapy Session Note  Patient Details  Name: Craig Knight MRN: [id number] Date of Birth: 1965-10-08  Today's Date: 10/06/2020 PT Individual Time: 5830-7460 PT Individual Time Calculation (min): 65 min   Short Term Goals: Week 2:  PT Short Term Goal 1 (Week 2): Pt will perform supine<>sit with min assist PT Short Term Goal 1 - Progress (Week 2): Progressing toward goal PT Short Term Goal 2 (Week 2): Pt will perform sit<>stands with min assist PT Short Term Goal 2 - Progress (Week 2): Met PT Short Term Goal 3 (Week 2): Pt will perform bed<>chair transfers with min assist PT Short Term Goal 3 - Progress (Week 2): Met PT Short Term Goal 4 (Week 2): Pt will ambulate at least 140f with +2 mod assist PT Short Term Goal 4 - Progress (Week 2): Progressing toward goal Week 3:  PT Short Term Goal 1 (Week 3): = to LTGs based on ELOS  Skilled Therapeutic Interventions/Progress Updates:  Patient seated upright in w/c on entrance to room. Patient alert and agreeable to PT session. Patient with no pain complaint throughout session.  Therapeutic Activity: Transfers: Patient performed STS and SPVT transfers mainly with CGA and one instance of Min A for power up with pt requiring two attempts to reach standing. Stands to no AD and min support from therapist. Provided verbal cues for safe hand progression in reaching back for w/c prior to descent to sit.  Gait Training:  Donned R LE DF assist ACE wrap and coverage for toe cap. Patient ambulated 21 x1 with Mod A +2 and HHA with heavy assist from therapist for LLE advancement/ placement as well as with lateral pelvic rotation for improved hip extension R>L as well as with achieving R TKE. Ot then ambulates 43 x1 with Mod A  using L hallway HR. Improvement noted in advancement in RLE and some hip extension/ rotation. Very slow pace throughout.   Neuromuscular Re-ed: NMR facilitated during session with focus on standing balance. Pt guided in  attaining balance during STS. Focus on LUE push to assist with initiation of movement and equal BLE activation/ WB for rise to stand. Pt continues to favor stronger side during activities. Pt then also guided in seated marches and LAQs with muscle belly tapping at quad for increased activation and muscle fiber recruitment.  Pt demos improved movement against gravity with muscle tapping with no carryover this session in voluntary movement without tapping.  NMR performed for improvements in motor control and coordination, balance, sequencing, judgement, and self confidence/ efficacy in performing all aspects of mobility at highest level of independence.   Patient seated upright  in w/c at end of session with brakes locked, belt alarm set, and all needs within reach. Wife present. Oriented to start of next session with OT.      Therapy Documentation Precautions:  Precautions Precautions: Fall, Other (comment) Precaution Comments: L hemianopsia; dense R hemiparesis Restrictions Weight Bearing Restrictions: No  Therapy/Group: Individual Therapy  JAlger SimonsPT, DPT 10/06/2020, 6:17 PM

## 2020-10-06 NOTE — Progress Notes (Signed)
Physical Therapy Session Note  Patient Details  Name: Craig Knight MRN: [id number] Date of Birth: 06-14-1965  Today's Date: 10/06/2020 PT Individual Time: 1635-1730 PT Individual Time Calculation (min): 55 min   Short Term Goals:  Week 3:  PT Short Term Goal 1 (Week 3): = to LTGs based on ELOS   Skilled Therapeutic Interventions/Progress Updates:   Pt received sitting EOB and agreeable to PT. Squat pivot transfer to [facility] with mod assist and cues for attention to the RLE.   Pt performed gait training in hall 67f +540fwith +2 HHA and on the L, DF wrap on the RLE, max assist for advancement of the RLE and stabilize in stance.   Pt performed ball puzzle while in tall kneeling x 4 minutes with min assist to maintain erect posture. Total A +2 to bring RLE onto mat table due to fear of falling and R hip weakness.  Foot taps on 3 inch step with BLE and max assist +2 with HHA on the L for advancement of RLE and activation of hip flexion, max A +2 to stabilize the RLE in stance to perform LLE foot taps.   Pt returned to room and performed squat pivot transfer to bed with max assist. Pt left sitting EOB with Wife present and call bell in reach.       Therapy Documentation Precautions:  Precautions Precautions: Fall, Other (comment) Precaution Comments: L hemianopsia; dense R hemiparesis Restrictions Weight Bearing Restrictions: No  Pain:   denies    Therapy/Group: Individual Therapy  AuLorie Phenix/01/2021, 5:43 PM

## 2020-10-07 DIAGNOSIS — I10 Essential (primary) hypertension: Secondary | ICD-10-CM

## 2020-10-07 DIAGNOSIS — E1142 Type 2 diabetes mellitus with diabetic polyneuropathy: Secondary | ICD-10-CM

## 2020-10-07 LAB — GLUCOSE, CAPILLARY
Glucose-Capillary: 113 mg/dL — ABNORMAL HIGH (ref 70–99)
Glucose-Capillary: 109 mg/dL — ABNORMAL HIGH (ref 70–99)
Glucose-Capillary: 94 mg/dL (ref 70–99)
Glucose-Capillary: 98 mg/dL (ref 70–99)

## 2020-10-07 NOTE — Progress Notes (Signed)
Physical Therapy Session Note  Patient Details  Name: Craig Knight MRN: [id number] Date of Birth: 1965/07/15  Today's Date: 10/07/2020 PT Individual Time: [id number] PT Individual Time Calculation (min): 56 min   Short Term Goals: Week 3:  PT Short Term Goal 1 (Week 3): = to LTGs based on ELOS  Skilled Therapeutic Interventions/Progress Updates:    Pt received sitting in w/c with his wife, Craig Knight, present and pt reporting he feels that his asthma is acting up and he needs a breathing treatment - nurse notified - no abnormal breathing noted during session. Pt agreeable to therapy session. Transported to/from gym in w/c for time management and energy conservation. Educated pt and wife on scheduled AFO and wheelchair consultations for Tuesday 8/16 - both in agreement with this plan. Educated wife on pt's need for different tennis shoes prior to the AFO consult.  Sit<>stands min assist during session with cuing to hold R UE wrist with L UE to prevent compensation with L UE and decreased R LE WBing.  Donned R LE GRAFO Sprystep Max to promote increased R knee extension during stance as pt tends to stay in a flexed posture (but not buckling). Donned shoe cover for decreased friction during swing phase for improved pt advancement of limb.   Gait training ~40f x5 trails with +2 assist and pt turning to sit in chair/on mat at end of each trial - trials 1-3 pt using L HHA from +2 assist but during trials 4-5 no UE support provided but +2 providing min assist at trunk for balance and to cuing upright posture: - therapist providing max assist to advance and position R LE during swing (prevent excessive hip external rotation and adduction), pt continues to demo some compensatory strategies with L trunk lean and circumduction with R hip rotated posteriorly - cuing and facilitation for improvement  - providing min cuing/facilitation progressed to mod assist with fatigue for increased R hip/knee extension during  stance with forward translation of R pelvis over R LE during stance  Pt demonstrates significant improvement in R stance control wearing GRAFO resulting in increased L LE step length (though still has hard, quick landing with that foot on initial contact) and therefore improved ability to advance R LE during swing.  L squat pivot EOM>w/c with min assist and pt demoing poor head/hips relationship (leans head L towards the w/c preventing him from pivoting his hips) - therapist visually demonstrated for improved motor learning - will need reinforcement of this. At end of session, pt left seated in w/c with needs in reach, chair alarm on, and wife present.   Therapy Documentation Precautions:  Precautions Precautions: Fall, Other (comment) Precaution Comments: L hemianopsia; dense R hemiparesis Restrictions Weight Bearing Restrictions: No   Pain:  No reports of pain throughout session.    Therapy/Group: Individual Therapy  CTawana Scale, PT, DPT, NCS, CSRS  10/07/2020, 12:30 PM

## 2020-10-07 NOTE — Progress Notes (Addendum)
PROGRESS NOTE   Subjective/Complaints: Patient seen sitting up at the edge of his bed this morning.  He states he s did not sleep well overnight because he wants to leave the hospital.  Attempted to educate patient.  Good sitting balance noted.  ROS: .Denies CP, SOB, N/V/D  Objective:   No results found. No results for input(s): WBC, HGB, HCT, PLT in the last 72 hours. No results for input(s): NA, K, CL, CO2, GLUCOSE, BUN, CREATININE, CALCIUM in the last 72 hours.  Intake/Output Summary (Last 24 hours) at 10/07/2020 1102 Last data filed at 10/07/2020 0750 Gross per 24 hour  Intake 480 ml  Output 425 ml  Net 55 ml         Physical Exam: Vital Signs Blood pressure (!) 139/96, pulse 63, temperature 98 F (36.7 C), resp. rate 18, height '5\' 7"'$  (1.702 m), weight 90.9 kg, SpO2 100 %. Constitutional: No distress . Vital signs reviewed. HENT: Normocephalic.  Atraumatic. Eyes: EOMI. No discharge. Cardiovascular: No JVD.  RRR. Respiratory: Normal effort.  No stridor.  Bilateral clear to auscultation. GI: Non-distended.  BS +. Skin: Warm and dry.  Intact. Psych: Normal mood.  Normal behavior. Musc: No edema in extremities.  No tenderness in extremities. Neuro: Alert Dysarthria Motor: LUE/LE: 5/5 proximal distal RUE/RLE: 0/5 proximal distal  Assessment/Plan: 1. Functional deficits which require 3+ hours per day of interdisciplinary therapy in a comprehensive inpatient rehab setting. Physiatrist is providing close team supervision and 24 hour management of active medical problems listed below. Physiatrist and rehab team continue to assess barriers to discharge/monitor patient progress toward functional and medical goals  Care Tool:  Bathing    Body parts bathed by patient: Chest, Abdomen, Right lower leg, Left lower leg, Right upper leg, Left upper leg, Front perineal area, Buttocks, Face   Body parts bathed by helper:  Right arm, Left arm     Bathing assist Assist Level: Minimal Assistance - Patient > 75%     Upper Body Dressing/Undressing Upper body dressing   What is the patient wearing?: Pull over shirt    Upper body assist Assist Level: Moderate Assistance - Patient 50 - 74%    Lower Body Dressing/Undressing Lower body dressing      What is the patient wearing?: Pants, Underwear/pull up     Lower body assist Assist for lower body dressing: Moderate Assistance - Patient 50 - 74%     Toileting Toileting    Toileting assist Assist for toileting: Minimal Assistance - Patient > 75% (for use of the urinal) Assistive Device Comment: urinal   Transfers Chair/bed transfer  Transfers assist     Chair/bed transfer assist level: Minimal Assistance - Patient > 75% (squat pivot)     Locomotion Ambulation   Ambulation assist   Ambulation activity did not occur: Safety/medical concerns  Assist level: 2 helpers (mod A of 1 and +2 for AD management) Assistive device: Walker-rolling Max distance: 64f   Walk 10 feet activity   Assist  Walk 10 feet activity did not occur: Safety/medical concerns  Assist level: 2 helpers (mod A of 1 and +2 for AD management) Assistive device: Walker-rolling   Walk 50 feet  activity   Assist Walk 50 feet with 2 turns activity did not occur:  (not turning yet)         Walk 150 feet activity   Assist Walk 150 feet activity did not occur: Safety/medical concerns         Walk 10 feet on uneven surface  activity   Assist Walk 10 feet on uneven surfaces activity did not occur: Safety/medical concerns         Wheelchair     Assist Will patient use wheelchair at discharge?:  (TBD) Type of Wheelchair: Manual    Wheelchair assist level: Supervision/Verbal cueing Max wheelchair distance: 67'    Wheelchair 50 feet with 2 turns activity    Assist        Assist Level: Supervision/Verbal cueing   Wheelchair 150 feet  activity     Assist      Assist Level: Dependent - Patient 0%   Blood pressure (!) 139/96, pulse 63, temperature 98 F (36.7 C), resp. rate 18, height '5\' 7"'$  (1.702 m), weight 90.9 kg, SpO2 100 %.    Assessment/plan: 1.  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 Refused loop recorder and ZIO patch added. No UE or LE movement so far on RIght side   Continue CIR 2.  Impaired mobility -DVT/anticoagulation: continue Lovenox             -antiplatelet therapy: Aspirin 81 mg daily and Brilinta 90 mg twice daily for 30 days then back to aspirin and Plavix DAPT  3. Neuropathic pain: Continue Neurontin 300 mg nightly  4. Mood/reactive depression/insomnia: Continue Trazodone 100 mg nightly, having some adjustment issues , would like neuropsych to follow , some hangover effect with higher trazodone dose reduced to '50mg'$   Irritable at times , on celexa '10mg'$  per day             -antipsychotic agents: N/A 5. Neuropsych: This patient is ?fully capable of making decisions on his own behalf. 6. Skin/Wound Care: Routine skin checks 7. Fluids/Electrolytes/Nutrition: Routine in and outs with follow-up chemistries 8.  Diabetes mellitus and peripheral neuropathy.  Hemoglobin A1c 7.4.  SSI.  Patient on Glucotrol 5 mg daily, Glucophage 500 mg twice daily prior to admission.  Resume as needed CBG (last 3)  Recent Labs    10/06/20 1639 10/06/20 2051 10/07/20 0630  GLUCAP 101* 116* 113*    Controlled on 8/13 9.  CKD stage III.    Creatinine 1.57 on 7/27, labs ordered for Monday 10.  Hyperlipidemia.  Lipitor 11.  COPD/tobacco abuse. Pt states he quite smoking 5 wks PTA  Continue inhalers as directed.occ wheezing after therapy , advised pt to request prn inhaler  12.  Diastolic congestive heart failure.  No signs of fluid overload. 13.  Obesity.  BMI 35.25.  Dietary  follow-up 14.  Incidental thyroid nodule measuring 1.5 cm.  Follow-up outpatient thyroid ultrasound 15.  Essential hypertension.  Currently on Hygroton 25 mg daily.  Patient also had been on hydralazine 50 mg twice daily, Imdur 60 mg daily Cozaar 100 mg daily prior to admission.     Vitals:   10/06/20 1937 10/07/20 0330  BP: (!) 133/91 (!) 139/96  Pulse: 72 63  Resp: 18 18  Temp: 98.5 F (36.9 C) 98 F (36.7 C)  SpO2: 0000000 123XX123  Diastolic blood pressure mildly elevated on 10/13, monitor trend  16. Had BM's 8/6 after sorbitol, last  BM 8/7 repeat sorbitol and increased senna S to BID 17. Right hand tremor, intermittent, with activity: discussed propanolol but he does not feel he needs at this time.  18.  Post stroke dysphagia  D3 thins, advance diet as tolerated   LOS: 18 days A FACE TO FACE EVALUATION WAS PERFORMED  Telesia Ates Lorie Phenix 10/07/2020, 11:02 AM

## 2020-10-07 NOTE — Progress Notes (Signed)
Occupational Therapy Session Note  Patient Details  Name: Craig Knight MRN: [id number] Date of Birth: 03-Sep-1965  Today's Date: 10/07/2020 OT Individual Time: 1300-1400 OT Individual Time Calculation (min): 60 min    Short Term Goals: Week 3:  OT Short Term Goal 1 (Week 3): Continue working on established LTGs set at min assist overall.  Skilled Therapeutic Interventions/Progress Updates:    Pt in recliner to start session.  He declines wanting to take a shower with requests to get out of the room.  Min assist for squat pivot transfer to the wheelchair with transport down to the ortho gym.  Pt with increased anxiety when attempting to position him in front of the UE ergonometer.  He pushed back explaining that he was anxious and didn't want to be closed in.  Transitioned over to the mat with plans to transfer onto it and then into supine for work on the [place].  He again stated that he would not likely tolerate laying on the mat.  Presented idea to go outside for a while to help calm his anxiousness and he was agreeable.  Therapist pushed him in the wheelchair and then had him complete stand pivot transfers to a fixed metal chair outside the main entrance with mod assist.  Mod facilitation for advancing the LLE during transfers.  Worked on [employer] while sitting with focus on shoulder flexion to 90 degrees.  Increased shoulder pain noted when trying to range higher than this.  Also had him work on [employer] external and internal rotation as well as triceps extension with therapist supporting his arm.  Trace shoulder movements noted with trace elbow extension in gravity eliminated plain.  He was returned to the room at the end of the session with request to remain up in the wheelchair.  Positioned safety belt around him and wedged pillows between the wheelchair and bed to help with support of the RUE.  Call button and phone in reach with therapist having to assist pt with calling his significant other using  his cell phone    Therapy Documentation Precautions:  Precautions Precautions: Fall, Other (comment) Precaution Comments: L hemianopsia; dense R hemiparesis Restrictions Weight Bearing Restrictions: No  Pain: Pain Assessment Pain Scale: Faces Pain Score: 0-No pain ADL: See Care Tool Section for some details of mobility and selfcare   Therapy/Group: Individual Therapy  Hilda Rynders OTR/L 10/07/2020, 4:02 PM

## 2020-10-08 DIAGNOSIS — I69391 Dysphagia following cerebral infarction: Secondary | ICD-10-CM

## 2020-10-08 LAB — GLUCOSE, CAPILLARY
Glucose-Capillary: 115 mg/dL — ABNORMAL HIGH (ref 70–99)
Glucose-Capillary: 96 mg/dL (ref 70–99)
Glucose-Capillary: 119 mg/dL — ABNORMAL HIGH (ref 70–99)
Glucose-Capillary: 119 mg/dL — ABNORMAL HIGH (ref 70–99)

## 2020-10-08 NOTE — Progress Notes (Signed)
PROGRESS NOTE   Subjective/Complaints: Patient seen sitting up at the edge of his bed this morning.  Good sitting balance noted.  He does not recall me from yesterday.  ROS: Denies CP, SOB, N/V/D  Objective:   No results found. No results for input(s): WBC, HGB, HCT, PLT in the last 72 hours. No results for input(s): NA, K, CL, CO2, GLUCOSE, BUN, CREATININE, CALCIUM in the last 72 hours.  Intake/Output Summary (Last 24 hours) at 10/08/2020 0812 Last data filed at 10/08/2020 0740 Gross per 24 hour  Intake 480 ml  Output 375 ml  Net 105 ml         Physical Exam: Vital Signs Blood pressure (!) 136/100, pulse 61, temperature 97.7 F (36.5 C), temperature source Oral, resp. rate 18, height '5\' 7"'$  (1.702 m), weight 90.6 kg, SpO2 100 %. Constitutional: No distress . Vital signs reviewed. HENT: Normocephalic.  Atraumatic. Eyes: EOMI. No discharge. Cardiovascular: No JVD.  RRR. Respiratory: Normal effort.  No stridor.  Bilateral clear to auscultation. GI: Non-distended.  BS +. Skin: Warm and dry.  Intact. Psych: Normal mood.  Normal behavior. Musc: No edema in extremities.  No tenderness in extremities. Neuro: Alert Dysarthria, unchanged Motor: LUE/LE: 5/5 proximal distal RUE/RLE: 0/5 proximal distal, unchanged  Assessment/Plan: 1. Functional deficits which require 3+ hours per day of interdisciplinary therapy in a comprehensive inpatient rehab setting. Physiatrist is providing close team supervision and 24 hour management of active medical problems listed below. Physiatrist and rehab team continue to assess barriers to discharge/monitor patient progress toward functional and medical goals  Care Tool:  Bathing    Body parts bathed by patient: Chest, Abdomen, Right lower leg, Left lower leg, Right upper leg, Left upper leg, Front perineal area, Buttocks, Face   Body parts bathed by helper: Right arm, Left arm      Bathing assist Assist Level: Minimal Assistance - Patient > 75%     Upper Body Dressing/Undressing Upper body dressing   What is the patient wearing?: Pull over shirt    Upper body assist Assist Level: Moderate Assistance - Patient 50 - 74%    Lower Body Dressing/Undressing Lower body dressing      What is the patient wearing?: Pants, Underwear/pull up     Lower body assist Assist for lower body dressing: Moderate Assistance - Patient 50 - 74%     Toileting Toileting    Toileting assist Assist for toileting: Minimal Assistance - Patient > 75% (for use of the urinal) Assistive Device Comment: urinal   Transfers Chair/bed transfer  Transfers assist     Chair/bed transfer assist level: Minimal Assistance - Patient > 75% (squat pivot)     Locomotion Ambulation   Ambulation assist   Ambulation activity did not occur: Safety/medical concerns  Assist level: 2 helpers (mod A of 1 and +2 min A) Assistive device: Hand held assist Max distance: 83f   Walk 10 feet activity   Assist  Walk 10 feet activity did not occur: Safety/medical concerns  Assist level: 2 helpers (mod A of 1 and +2 min A) Assistive device: Hand held assist   Walk 50 feet activity   Assist Walk 50 feet  with 2 turns activity did not occur:  (not turning yet)  Assist level: 2 helpers (mod A of 1 and +2 min A) Assistive device: Hand held assist    Walk 150 feet activity   Assist Walk 150 feet activity did not occur: Safety/medical concerns         Walk 10 feet on uneven surface  activity   Assist Walk 10 feet on uneven surfaces activity did not occur: Safety/medical concerns         Wheelchair     Assist Will patient use wheelchair at discharge?:  (TBD) Type of Wheelchair: Manual    Wheelchair assist level: Supervision/Verbal cueing Max wheelchair distance: 32'    Wheelchair 50 feet with 2 turns activity    Assist        Assist Level: Supervision/Verbal  cueing   Wheelchair 150 feet activity     Assist      Assist Level: Dependent - Patient 0%   Blood pressure (!) 136/100, pulse 61, temperature 97.7 F (36.5 C), temperature source Oral, resp. rate 18, height '5\' 7"'$  (1.702 m), weight 90.6 kg, SpO2 100 %.    Assessment/plan: 1.  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 Refused loop recorder and ZIO patch added. No UE or LE movement so far on RIght side   Continue CIR 2.  Impaired mobility -DVT/anticoagulation: continue Lovenox             -antiplatelet therapy: Aspirin 81 mg daily and Brilinta 90 mg twice daily for 30 days then back to aspirin and Plavix DAPT  3. Neuropathic pain: Continue Neurontin 300 mg nightly  4. Mood/reactive depression/insomnia: Continue Trazodone 100 mg nightly, having some adjustment issues , would like neuropsych to follow , some hangover effect with higher trazodone dose reduced to '50mg'$   Irritable at times , on celexa '10mg'$  per day, stable on 8/14             -antipsychotic agents: N/A 5. Neuropsych: This patient is ?fully capable of making decisions on his own behalf. 6. Skin/Wound Care: Routine skin checks 7. Fluids/Electrolytes/Nutrition: Routine in and outs with follow-up chemistries 8.  Diabetes mellitus and peripheral neuropathy.  Hemoglobin A1c 7.4.  SSI.  Patient on Glucotrol 5 mg daily, Glucophage 500 mg twice daily prior to admission.  Resume as needed CBG (last 3)  Recent Labs    10/07/20 1707 10/07/20 2101 10/08/20 0608  GLUCAP 94 98 115*    Controlled on 8/14 9.  CKD stage III.    Creatinine 1.57 on 7/27, labs ordered for tomorrow 10.  Hyperlipidemia.  Lipitor 11.  COPD/tobacco abuse. Pt states he quite smoking 5 wks PTA  Continue inhalers as directed.occ wheezing after therapy , advised pt to request prn inhaler  12.  Diastolic congestive heart failure.   No signs of fluid overload. 13.  Obesity.  BMI 35.25.  Dietary follow-up 14.  Incidental thyroid nodule measuring 1.5 cm.  Follow-up outpatient thyroid ultrasound 15.  Essential hypertension.  Currently on Hygroton 25 mg daily.  Patient also had been on hydralazine 50 mg twice daily, Imdur 60 mg daily Cozaar 100 mg daily prior to admission.     Vitals:   10/08/20 0440 10/08/20 0451  BP: (!) 145/101 (!) 136/100  Pulse: 63 61  Resp: 18   Temp: 97.7 F (36.5 C)   SpO2: 123XX123   Diastolic blood pressure remain elevated on 8/14, will  consider further adjustments if persistent  16. Had BM's 8/6 after sorbitol, last BM 8/7 repeat sorbitol and increased senna S to BID 17. Right hand tremor, intermittent, with activity: discussed propanolol but he does not feel he needs at this time.  18.  Post stroke dysphagia  D3 thins, advance diet as tolerated   LOS: 19 days A FACE TO FACE EVALUATION WAS PERFORMED  Kayin Kettering Lorie Phenix 10/08/2020, 8:12 AM

## 2020-10-08 NOTE — Progress Notes (Signed)
Speech Language Pathology Daily Session Note  Patient Details  Name: Craig Knight MRN: [id number] Date of Birth: 1966/01/16  Today's Date: 10/08/2020 SLP Individual Time: 1430-1530 SLP Individual Time Calculation (min): 60 min  Short Term Goals: Week 3: SLP Short Term Goal 1 (Week 3): STG=LTG due to ELOS  Skilled Therapeutic Interventions:  Pt was seen for skilled ST targeting cognitive goals.  Pt was seated upright in recliner upon therapist's arrival awake, alert, and agreeable to participating in treatment.  Pt reports that his speech and cognition are almost back to baseline but his wife endorses noticing memory deficits, specifically pertaining to navigating his new smart phone.  Pt reports that his difficulties result from changing from an android to an Iphone but wife reports that pt had learned navigation prior to hospitalization.  SLP facilitated the session with a novel card game targeting use of memory strategies, specifically word-picture associations.  Pt needed mod assist to generate and recall word-picture associations.  He was able to name specific category members during generative naming portion of task and then recalled 100% of named items with no more than min assist verbal cues. Pt stated that the task was harder than he expected.  Pt was left in recliner with wife at bedside.  Continue per current plan of care.    Pain Pain Assessment Pain Scale: 0-10 Pain Score: 0-No pain  Therapy/Group: Individual Therapy  Craig Knight, Selinda Orion 10/08/2020, 3:48 PM

## 2020-10-09 ENCOUNTER — Ambulatory Visit: Payer: Medicare Other

## 2020-10-09 ENCOUNTER — Encounter: Payer: Medicare Other | Admitting: Occupational Therapy

## 2020-10-09 LAB — GLUCOSE, CAPILLARY
Glucose-Capillary: 115 mg/dL — ABNORMAL HIGH (ref 70–99)
Glucose-Capillary: 137 mg/dL — ABNORMAL HIGH (ref 70–99)
Glucose-Capillary: 106 mg/dL — ABNORMAL HIGH (ref 70–99)

## 2020-10-09 NOTE — Progress Notes (Signed)
Physical Therapy Session Note  Patient Details  Name: Craig Knight MRN: [id number] Date of Birth: 25-Feb-1966  Today's Date: 10/09/2020 PT Individual Time: 1100-1157 PT Individual Time Calculation (min): 57 min   Short Term Goals: Week 3:  PT Short Term Goal 1 (Week 3): = to LTGs based on ELOS   Skilled Therapeutic Interventions/Progress Updates:     Pt seated in w/c to start session, agreeable to therapy. No reports of pain throughout session. Wife, [name], at bedside who had questions regarding shoe wear for AFO. Instructed on proper, wide fitting shoe that is "athletic wear" to provide support. Also instructed to provide Velcro shoes to allow improved indep. Donned socks, shoes, and R GRAFO at w/c level with totalA for time.   Pt wheeled to main rehab hallway. Donned shoe cover for reduced friction in swing phase to improve R limb advancement. Sit<>stand to RW with minA to RW with cuing to push up with LUE from w/c arm rest and to strap RUE into RW orthotic.   Gait training 81f + 665fwith +2 assist for w/c follow and minA for balance and totalA for advancing R foot. Gait distance limited due to fatigue (pt reports chronic asthma). Continues to demo compensatory strategies with gait including L trunk lean and R hip circumduction - cues throughout gait cycle for normalizing and increasing hip/knee flexors to facilitate swing.  Worked on hemi propulsion in w/c where he was able to propel himself ~752fith supervision with LUE and LLE on level surfaces - speed slowed and propulsion is effortful - required increased cues for managing doorways. Instructed on setup for w/c transfers to mat table and pt positioned himself directly in front of the mat table rather than beside it. Required education on transfer techniques to reduce falls risk and importance of setup to allow safety with transfers. He also required ed on removal of arm rests and leg rests prior to transferring but he did remember to  lock the brakes.  Completed a total of x2 transfers with minA via squat<>pivot to both R and L directions. He did require ++ time to problem solve how to transfer to the R as he initially was attempting to stand<>pivot.   Pt then requesting to return to his room to void. Returned with totalA for time and urgency and provided urinal where he was continent of bladder. Removed R GRAFO and shoes and pt remained seated in w/c at end of session with safety belt alarm on and all needs met.  Therapy Documentation Precautions:  Precautions Precautions: Fall, Other (comment) Precaution Comments: L hemianopsia; dense R hemiparesis Restrictions Weight Bearing Restrictions: No General:    Therapy/Group: Individual Therapy  ChrAlger Simons15/2022, 7:29 AM

## 2020-10-09 NOTE — Progress Notes (Addendum)
Speech Language Pathology Daily Session Note  Patient Details  Name: Craig Knight MRN: [id number] Date of Birth: Feb 04, 1966  Today's Date: 10/09/2020 SLP Individual Time: 1500-1530 SLP Individual Time Calculation (min): 30 min  Short Term Goals: Week 3: SLP Short Term Goal 1 (Week 3): STG=LTG due to ELOS  Skilled Therapeutic Interventions: Patient agreeable to skilled ST intervention with focus on cognitive goals. Facilitated transfer from edge of bed to wheelchair with sup A verbal cues for safety (wearing non slip socks or shoes, transferring toward strong side, locking wheelchair breaks). Facilitated problem solving, recall, and reasoning skills with novel card "game" Blink. Patient completed with min A verbal cues for reminders of instructions and reasoning, fading to sup A verbal cues. Patient required less verbal reinforcement to implement speech intelligibility strategies to enhance speech clarity as compared to morning session which is consistent with patient's report of decreased intelligibility in the morning time. Patient was safely transferred back to bed with alarm activated and immediate needs within reach at end of session. Spouse present at bedside and was educated on patient's progress, speech intelligibility strategies and cues, diet tolerance and swallow safety, and compensatory memory strategies. Continue per current plan of care.      Pain Pain Assessment Pain Scale: 0-10 Pain Score: 2   Therapy/Group: Individual Therapy  Patty Sermons 10/09/2020, 5:43 PM

## 2020-10-09 NOTE — Progress Notes (Signed)
Occupational Therapy Session Note  Patient Details  Name: Craig Knight MRN: [id number] Date of Birth: [birth year]  Today's Date: 10/09/2020 OT Individual Time: [id number] OT Individual Time Calculation (min): 58 min    Short Term Goals: Week 3:  OT Short Term Goal 1 (Week 3): Continue working on established LTGs set at min assist overall.  Skilled Therapeutic Interventions/Progress Updates:    Pt resting in w/c upon arrival. Pt declined bathing/changing clothing but did requested to brush his teeth and wash his face. Pt completed tasks with setup. Pt transported to ortho gym (time mgmt). Squat pivot to EOM with min A. Pt engaged in RUE table tasks with focus on initiating shoulder flexion, supination, and horizontal adduction with gravity eliminated. Trace scapula protraction/retraction noted. Pt with limited supination ROM (PROM performed with minimal results). Trace horizontal adduction/abduction noted. Pt requires max multimodal cues for isolation of movement. Squat pivot transfer back to w/c with min A. Pt remained in w/c with all needs within reach and belt alarm activated.   Therapy Documentation Precautions:  Precautions Precautions: Fall, Other (comment) Precaution Comments: L hemianopsia; dense R hemiparesis Restrictions Weight Bearing Restrictions: No Pain: Pain Assessment Pain Scale: 0-10 Pain Score: 0-No pain    Therapy/Group: Individual Therapy  Leroy Libman 10/09/2020, 10:46 AM

## 2020-10-09 NOTE — Progress Notes (Signed)
PROGRESS NOTE   Subjective/Complaints:  Pt more upbeat, would like to d/c on Friday , no pain  Did not recognize me   ROS: Denies CP, SOB, N/V/D  Objective:   No results found. No results for input(s): WBC, HGB, HCT, PLT in the last 72 hours. No results for input(s): NA, K, CL, CO2, GLUCOSE, BUN, CREATININE, CALCIUM in the last 72 hours.  Intake/Output Summary (Last 24 hours) at 10/09/2020 0807 Last data filed at 10/09/2020 0438 Gross per 24 hour  Intake 480 ml  Output --  Net 480 ml         Physical Exam: Vital Signs Blood pressure 125/90, pulse 71, temperature 97.8 F (36.6 C), temperature source Oral, resp. rate 18, height '5\' 7"'$  (1.702 m), weight 89.3 kg, SpO2 99 %.  General: No acute distress Mood and affect are appropriate Heart: Regular rate and rhythm no rubs murmurs or extra sounds Lungs: Clear to auscultation, breathing unlabored, no rales or wheezes Abdomen: Positive bowel sounds, soft nontender to palpation, nondistended Extremities: No clubbing, cyanosis, or edema Skin: No evidence of breakdown, no evidence of rash  Neuro: Alert Dysarthria, unchanged Motor: LUE/LE: 5/5 proximal distal RUE/RLE: 0/5 proximal distal, unchanged  Assessment/Plan: 1. Functional deficits which require 3+ hours per day of interdisciplinary therapy in a comprehensive inpatient rehab setting. Physiatrist is providing close team supervision and 24 hour management of active medical problems listed below. Physiatrist and rehab team continue to assess barriers to discharge/monitor patient progress toward functional and medical goals  Care Tool:  Bathing    Body parts bathed by patient: Chest, Abdomen, Right lower leg, Left lower leg, Right upper leg, Left upper leg, Front perineal area, Buttocks, Face   Body parts bathed by helper: Right arm, Left arm     Bathing assist Assist Level: Minimal Assistance - Patient > 75%      Upper Body Dressing/Undressing Upper body dressing   What is the patient wearing?: Pull over shirt    Upper body assist Assist Level: Moderate Assistance - Patient 50 - 74%    Lower Body Dressing/Undressing Lower body dressing      What is the patient wearing?: Pants, Underwear/pull up     Lower body assist Assist for lower body dressing: Moderate Assistance - Patient 50 - 74%     Toileting Toileting    Toileting assist Assist for toileting: Minimal Assistance - Patient > 75% (for use of the urinal) Assistive Device Comment: urinal   Transfers Chair/bed transfer  Transfers assist     Chair/bed transfer assist level: Minimal Assistance - Patient > 75% (squat pivot)     Locomotion Ambulation   Ambulation assist   Ambulation activity did not occur: Safety/medical concerns  Assist level: 2 helpers (mod A of 1 and +2 min A) Assistive device: Hand held assist Max distance: 16f   Walk 10 feet activity   Assist  Walk 10 feet activity did not occur: Safety/medical concerns  Assist level: 2 helpers (mod A of 1 and +2 min A) Assistive device: Hand held assist   Walk 50 feet activity   Assist Walk 50 feet with 2 turns activity did not occur:  (not turning  yet)  Assist level: 2 helpers (mod A of 1 and +2 min A) Assistive device: Hand held assist    Walk 150 feet activity   Assist Walk 150 feet activity did not occur: Safety/medical concerns         Walk 10 feet on uneven surface  activity   Assist Walk 10 feet on uneven surfaces activity did not occur: Safety/medical concerns         Wheelchair     Assist Will patient use wheelchair at discharge?:  (TBD) Type of Wheelchair: Manual    Wheelchair assist level: Supervision/Verbal cueing Max wheelchair distance: 62'    Wheelchair 50 feet with 2 turns activity    Assist        Assist Level: Supervision/Verbal cueing   Wheelchair 150 feet activity     Assist      Assist  Level: Dependent - Patient 0%   Blood pressure 125/90, pulse 71, temperature 97.8 F (36.6 C), temperature source Oral, resp. rate 18, height '5\' 7"'$  (1.702 m), weight 89.3 kg, SpO2 99 %.    Assessment/plan: 1.  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 Refused loop recorder and ZIO patch added. No UE or LE movement so far on RIght side   Continue CIR 2.  Impaired mobility -DVT/anticoagulation: continue Lovenox             -antiplatelet therapy: Aspirin 81 mg daily and Brilinta 90 mg twice daily for 30 days then back to aspirin and Plavix DAPT  3. Neuropathic pain: Continue Neurontin 300 mg nightly  4. Mood/reactive depression/insomnia: Continue Trazodone 100 mg nightly, having some adjustment issues , would like neuropsych to follow , some hangover effect with higher trazodone dose reduced to '50mg'$   Irritable at times , on celexa '10mg'$  per day, stable on 8/14          May be brighter on reduced trazodone  5. Neuropsych: This patient is ?fully capable of making decisions on his own behalf. 6. Skin/Wound Care: Routine skin checks 7. Fluids/Electrolytes/Nutrition: Routine in and outs with follow-up chemistries 8.  Diabetes mellitus and peripheral neuropathy.  Hemoglobin A1c 7.4.  SSI.  Patient on Glucotrol 5 mg daily, Glucophage 500 mg twice daily prior to admission.  Resume as needed CBG (last 3)  Recent Labs    10/08/20 1650 10/08/20 2116 10/09/20 0621  GLUCAP 119* 119* 115*    Controlled on 8/15 9.  CKD stage III.    Creatinine 1.57 on 7/27, labs ordered for tomorrow 10.  Hyperlipidemia.  Lipitor 11.  COPD/tobacco abuse. Pt states he quite smoking 5 wks PTA  Continue inhalers as directed.occ wheezing after therapy , advised pt to request prn inhaler  12.  Diastolic congestive heart failure.  No signs of fluid overload. 13.  Obesity.  BMI 35.25.  Dietary  follow-up 14.  Incidental thyroid nodule measuring 1.5 cm.  Follow-up outpatient thyroid ultrasound 15.  Essential hypertension.  Currently on Hygroton 25 mg daily.  Patient also had been on hydralazine 50 mg twice daily, Imdur 60 mg daily Cozaar 100 mg daily prior to admission.     Vitals:   10/09/20 0427 10/09/20 0520  BP: (!) 126/97 125/90  Pulse: 76 71  Resp: 18 18  Temp: 98.4 F (36.9 C) 97.8 F (36.6 C)  SpO2: 97% 99%  Borderline diastolic elevation but systolic well controlled   16. Had BM's 8/6 after sorbitol, last  BM 8/7 repeat sorbitol and increased senna S to BID 17. Right hand tremor, intermittent, with activity: discussed propanolol but he does not feel he needs at this time.  18.  Post stroke dysphagia  D3 thins, advance diet as tolerated   LOS: 20 days A FACE TO [place] E Clark Cuff 10/09/2020, 8:07 AM

## 2020-10-09 NOTE — Progress Notes (Signed)
Speech Language Pathology Daily Session Note  Patient Details  Name: Craig Knight MRN: [id number] Date of Birth: 06-24-1965  Today's Date: 10/09/2020 SLP Individual Time: 0800-0830 SLP Individual Time Calculation (min): 30 min  Short Term Goals: Week 3: SLP Short Term Goal 1 (Week 3): STG=LTG due to ELOS  Skilled Therapeutic Interventions: Patient agreeable to skilled ST intervention with focus on speech and cognitive goals. Patient recalled 1/3 speech strategies to enhance speech intelligibility. Patient required sup A verbal cues to increase vocal intensity and reduce speech rate to achieve ~75-90% intelligibility at conversation level. Assessed carry over of skills to navigate phone. Patient required sup A verbal cues for recall on how to access contacts, recent calls, opening/closing apps with limited carry over during session attributed to decreased recall. Patient was later observed calling his spouse and answering a call without difficulty. Patient was left in chair with alarm activated and immediate needs within reach at end of session. Continue per current plan of care.      Pain Pain Assessment Pain Scale: 0-10 Pain Score: 0-No pain  Therapy/Group: Individual Therapy  Patty Sermons 10/09/2020, 8:24 AM

## 2020-10-09 NOTE — Progress Notes (Signed)
Patient ID: Craig Knight, male   DOB: 12/25/1965, 55 y.o.   MRN: [id number]  This SW covering for primary SW, [name].   SW sent demo sheet as efax to [facility] for pt w/c eval tomorrow.   Loralee Pacas, MSW, [place] Office: [phone] Cell: [phone] Fax: [phone]

## 2020-10-09 NOTE — Progress Notes (Signed)
Patient complained of 8/10 aching pain on L substernal not radiating anywhere, denies SOB On palpation, pt stated it hurts more. Vital signs taken. Dr. Posey Pronto and Rapid Response RN informed. 12 lead EKG done and morning dose of Zanaflex given. Patient reported pain now 2/10.

## 2020-10-10 LAB — GLUCOSE, CAPILLARY
Glucose-Capillary: 99 mg/dL (ref 70–99)
Glucose-Capillary: 91 mg/dL (ref 70–99)
Glucose-Capillary: 126 mg/dL — ABNORMAL HIGH (ref 70–99)
Glucose-Capillary: 122 mg/dL — ABNORMAL HIGH (ref 70–99)

## 2020-10-10 NOTE — Progress Notes (Signed)
Physical Therapy Session Note  Patient Details  Name: Craig Knight MRN: [id number] Date of Birth: 1965-11-11  Today's Date: 10/10/2020 PT Individual Time: 1307-1400 and [id number] PT Individual Time Calculation (min): 53 min and 43 min  Short Term Goals: Week 3:  PT Short Term Goal 1 (Week 3): = to LTGs based on ELOS  Skilled Therapeutic Interventions/Progress Updates:    Session 1: Pt received sitting EOB with his wife, [name], present and pt agreeable to therapy session. Pt's wife reports the ramped entrance into their home is complete and ready to use. Pt's wife brought in new tennis shoes; however, the tongue is sewn to the shoe therefore unable to open up the shoe enough for pt to get either foot inside - pt's wife planning to purchase different shoes and bring them in on Thursday morning.   Therapist donned pt's shoes and R LE Sprystep Max GRAFO total assist. Gerald Stabs, orthotist from [place], arrived for AFO consultation.  R squat pivot EOB>w/c with light min assist for balance. Therapist educated pt on continuing to perform this type of transfer as it will be the recommended transfer technique to perform at home with family assistance to increase pt independence and decrease fall risk.Transported to/from gym in w/c for time management and energy conservation.  Donned R shoe cap to allow improved advancement during swing. Gait training ~54f using RW and wearing Sprystep Max GRAFO - requires min assist of 1 for balance and AD management (pt often pushing it too far forward during R swing advancement) - continued max assist to advance R LE during swing with it staying in excessive hip external rotation due to toe drag - tactile cuing/intermittent min facilitation for increased R knee extension during stance - repeated cuing for increased L LE step length for reciprocal pattern and to maintain trunk/hip extension for upright posture.  Trialed [place] Walk-on Reaction GRAFO but due to medial strut  pushing on pt's navicular bone pt unable to tolerate this brace.  Transitioned to T[place] O'LakesGRAFO - gait training ~543fusing RW with min assist for balance and AD management as above - mod/max assist to advance R LE during swing with pt demonstrating improved ability to bring foot through compared to with Sprystep Max GRAFO - pt does start developing a slight vault compensation initially but stops after ~3 steps - otherwise similar gait pattern as described above but with improved swing allowing improved energy savings and further gait distance. Pt/wife in agreement with recommendation for [place] SpryStep Plus GRAFO with R shoe toe-cap - orthotist planning to finalize these on Thursday. Therapist reinforced education that pt is not yet safe to ambulate at home with family assistance but that this brace will be utilized during OPPT and it will help with his standing balance for decreased fall risk and foot positioning to decrease risk of plantarflexion contractures. At end of session, pt left seated in w/c with needs in reach, chair alarm on, and his wife present.    Session 2: Pt received sitting in w/c with his wife, [name] present for wheelchair consultation. Pt agreeable to therapy session. BrErlene Quannformed pt/therapist that based on his diagnoses of CVA, COPD, and asthma that he qualifies for a power wheelchair or manual wheelchair. Discussed pt's needs and progress thus far during CIR and that it is difficult to determine at this time if pt will require manual vs power wheelchair to allow mod-I functional mobility in the home and community. Pt/wife and therapist  agreed with recommendation to defer decision about wheelchair to outpatient therapy once pt has time for continued recovery. [employer] will plan to provide pt with loaner manual wheelchair (being delivered [facility] 8/18 at [facility]) to be used upon D/C while receiving OPPT.  Pt reports need to use bathroom -  encouraged pt to void in standing using urinal but pt preferred to perform in sitting - set-up assist. Pt still wearing [place] SpryStep Plus GRAFO - therapist doffed and assessed skin with no areas of redness and pt reporting no discomfort. Performed L hemi-technique w/c propulsion ~90f with 3x short rest breaks due to fatigue and pt requesting to "catch my breath" - demos effective propulsion technique. At end of session pt left seated in w/c with needs in reach, chair alarm on, and his wife present.  Therapy Documentation Precautions:  Precautions Precautions: Fall, Other (comment) Precaution Comments: L hemianopsia; dense R hemiparesis Restrictions Weight Bearing Restrictions: No   Pain: Session 1: No reports of pain throughout session.  Session 2: No reports of pain throughout session.   Therapy/Group: Individual Therapy  CTawana Scale, PT, DPT, NCS, CSRS 10/10/2020, 12:17 PM

## 2020-10-10 NOTE — Plan of Care (Signed)
  Problem: RH Ambulation Goal: LTG Patient will ambulate in home environment (PT) Description: LTG: Patient will ambulate in home environment, # of feet with assistance (PT). Outcome: Not Applicable Flowsheets (Taken 10/10/2020 1841) LTG: Pt will ambulate in home environ  assist needed:: (discontinued as pt will not be a safe functional household ambulator) -- Note: discontinued as pt will not be a safe functional household ambulator   Problem: RH Stairs Goal: LTG Patient will ambulate up and down stairs w/assist (PT) Description: LTG: Patient will ambulate up and down # of stairs with assistance (PT) Outcome: Not Applicable Flowsheets (Taken 10/10/2020 1841) LTG: Pt will ambulate up/down stairs assist needed:: (discontinued as pt now has a ramped entrance into the home) -- Note: discontinued as pt now has a ramped entrance into the home

## 2020-10-10 NOTE — Progress Notes (Signed)
Occupational Therapy Session Note  Patient Details  Name: Craig Knight MRN: [id number] Date of Birth: 12/01/1965  Today's Date: 10/10/2020 OT Individual Time: 0800-0902 OT Individual Time Calculation (min): 62 min    Short Term Goals: Week 3:  OT Short Term Goal 1 (Week 3): Continue working on established LTGs set at min assist overall.  Skilled Therapeutic Interventions/Progress Updates:    Pt sitting EOB to start, agreeable to working on ADL tasks this session.  Mod assist for functional mobility to the shower from the EOB without an assistive device.  Max facilitation to advance the RLE secondary to weakness and not having AFO in place.  Mod assist in stance phase to help maintain left knee control and balance.  He was able to remove all clothing with min assist sit to stand for shower with completion of shower at overall min assist level as well.  He uses LH sponge for washing lower legs and feet as well as his back.  Max hand over hand needed to incorporate the RUE for washing any other parts.  Transferred to the wheelchair with min assist stand pivot in order to complete oral hygiene and dressing sit to stand at the sink.  He needed min assist for donning his tank top as well as underpants following hemi technique with mod assist for pull up shorts.  He needed max assist for regular socks and for gripper socks.  Finished session with pt in the wheelchair with the call button and phone in reach.   Therapy Documentation Precautions:  Precautions Precautions: Fall, Other (comment) Precaution Comments: L hemianopsia; dense R hemiparesis Restrictions Weight Bearing Restrictions: No  Pain: Pain Assessment Pain Scale: Faces Pain Score: 0-No pain ADL: See Care Tool Section for some details of mobility and selfcare   Therapy/Group: Individual Therapy  Craig Knight OTR/L 10/10/2020, 9:40 AM

## 2020-10-10 NOTE — Plan of Care (Signed)
  Problem: RH Dressing Goal: LTG Patient will perform lower body dressing w/assist (OT) Description: LTG: Patient will perform lower body dressing with assist, with/without cues in positioning using equipment (OT) Flowsheets (Taken 10/10/2020 0800) LTG: Pt will perform lower body dressing with assistance level of: (Goal downgraded based on progress) Moderate Assistance - Patient 50 - 74% Note: Goal downgraded based on progress

## 2020-10-10 NOTE — Progress Notes (Signed)
Speech Language Pathology Daily Session Note  Patient Details  Name: Craig Knight MRN: [id number] Date of Birth: 1965-07-09  Today's Date: 10/10/2020 SLP Individual Time: 0930-1015 SLP Individual Time Calculation (min): 45 min  Short Term Goals: Week 3: SLP Short Term Goal 1 (Week 3): STG=LTG due to ELOS  Skilled Therapeutic Interventions: Patient agreeable to skilled ST intervention with focus on cognitive and speech goals. Patient was able to recall 3/3 speech intelligibility strategies and required min A verbal reminders and verbal encouragement to implement at conversation level to enhance speech intelligibility at ~75%. Patient appeared less motivated today and less likely to repair speech breakdowns. 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 Stuttering has not been observed this admission and with further discussion it is suspected patient uses the word "stutter" to describe possible articulation difficulties that  he experienced as a child that has affected him less into adulthood. Nevertheless, he stated "I'm not going to put in any more effort because it doesn't really matter." SLP to no longer address motor speech goals due to patient preference and low motivation. SLP facilitated short-term recall task in which patient recalled 4/8 novel items independently following 15 minute delay, progressing to 8/8 with min A semantic cues. Patient was left in wheelchair with alarm activated and immediate needs within reach at end of session. Continue per current plan of care.      Pain Pain Assessment Pain Scale: 0-10 Pain Score: 0-No pain  Therapy/Group: Individual Therapy  Craig Knight 10/10/2020, 10:01 AM

## 2020-10-10 NOTE — Progress Notes (Signed)
PROGRESS NOTE   Subjective/Complaints:  Refusing enoxaparin due to bruising, pt am 40-60' , discussed that may d/c enoxaparin if amb >100'  ROS: Denies CP, SOB, N/V/D  Objective:   No results found. No results for input(s): WBC, HGB, HCT, PLT in the last 72 hours. No results for input(s): NA, K, CL, CO2, GLUCOSE, BUN, CREATININE, CALCIUM in the last 72 hours.  Intake/Output Summary (Last 24 hours) at 10/10/2020 0745 Last data filed at 10/09/2020 2000 Gross per 24 hour  Intake 360 ml  Output 350 ml  Net 10 ml         Physical Exam: Vital Signs Blood pressure (!) 136/91, pulse 71, temperature 97.8 F (36.6 C), temperature source Oral, resp. rate 16, height '5\' 7"'$  (1.702 m), weight 89.3 kg, SpO2 99 %.  General: No acute distress Mood and affect are appropriate Heart: Regular rate and rhythm no rubs murmurs or extra sounds Lungs: Clear to auscultation, breathing unlabored, no rales or wheezes Abdomen: Positive bowel sounds, soft nontender to palpation, nondistended Extremities: No clubbing, cyanosis, or edema Skin: No evidence of breakdown, no evidence of rash  Neuro: Alert Dysarthria, unchanged Motor: LUE/LE: 5/5 proximal distal RUE/RLE: 0/5 proximal distal, unchanged  Assessment/Plan: 1. Functional deficits which require 3+ hours per day of interdisciplinary therapy in a comprehensive inpatient rehab setting. Physiatrist is providing close team supervision and 24 hour management of active medical problems listed below. Physiatrist and rehab team continue to assess barriers to discharge/monitor patient progress toward functional and medical goals  Care Tool:  Bathing    Body parts bathed by patient: Chest, Abdomen, Right lower leg, Left lower leg, Right upper leg, Left upper leg, Front perineal area, Buttocks, Face   Body parts bathed by helper: Right arm, Left arm     Bathing assist Assist Level: Minimal  Assistance - Patient > 75%     Upper Body Dressing/Undressing Upper body dressing   What is the patient wearing?: Pull over shirt    Upper body assist Assist Level: Moderate Assistance - Patient 50 - 74%    Lower Body Dressing/Undressing Lower body dressing      What is the patient wearing?: Pants, Underwear/pull up     Lower body assist Assist for lower body dressing: Moderate Assistance - Patient 50 - 74%     Toileting Toileting    Toileting assist Assist for toileting: Minimal Assistance - Patient > 75% (for use of the urinal) Assistive Device Comment: urinal   Transfers Chair/bed transfer  Transfers assist     Chair/bed transfer assist level: Minimal Assistance - Patient > 75% (squat pivot)     Locomotion Ambulation   Ambulation assist   Ambulation activity did not occur: Safety/medical concerns  Assist level: 2 helpers (mod A of 1 and +2 min A) Assistive device: Hand held assist Max distance: 82f   Walk 10 feet activity   Assist  Walk 10 feet activity did not occur: Safety/medical concerns  Assist level: 2 helpers (mod A of 1 and +2 min A) Assistive device: Hand held assist   Walk 50 feet activity   Assist Walk 50 feet with 2 turns activity did not occur:  (not  turning yet)  Assist level: 2 helpers (mod A of 1 and +2 min A) Assistive device: Hand held assist    Walk 150 feet activity   Assist Walk 150 feet activity did not occur: Safety/medical concerns         Walk 10 feet on uneven surface  activity   Assist Walk 10 feet on uneven surfaces activity did not occur: Safety/medical concerns         Wheelchair     Assist Will patient use wheelchair at discharge?:  (TBD) Type of Wheelchair: Manual    Wheelchair assist level: Supervision/Verbal cueing Max wheelchair distance: 38'    Wheelchair 50 feet with 2 turns activity    Assist        Assist Level: Supervision/Verbal cueing   Wheelchair 150 feet  activity     Assist      Assist Level: Dependent - Patient 0%   Blood pressure (!) 136/91, pulse 71, temperature 97.8 F (36.6 C), temperature source Oral, resp. rate 16, height '5\' 7"'$  (1.702 m), weight 89.3 kg, SpO2 99 %.    Assessment/plan: 1.  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 Refused loop recorder and ZIO patch added. No UE or LE movement so far on RIght side   Continue CIR 2.  Impaired mobility -DVT/anticoagulation: continue Lovenox             -antiplatelet therapy: Aspirin 81 mg daily and Brilinta 90 mg twice daily for 30 days then back to aspirin and Plavix DAPT  3. Neuropathic pain: Continue Neurontin 300 mg nightly  4. Mood/reactive depression/insomnia: Continue Trazodone 100 mg nightly, having some adjustment issues , would like neuropsych to follow , some hangover effect with higher trazodone dose reduced to '50mg'$   Irritable at times , on celexa '10mg'$  per day, stable on 8/14          May be brighter on reduced trazodone  5. Neuropsych: This patient is ?fully capable of making decisions on his own behalf. 6. Skin/Wound Care: Routine skin checks 7. Fluids/Electrolytes/Nutrition: Routine in and outs with follow-up chemistries 8.  Diabetes mellitus and peripheral neuropathy.  Hemoglobin A1c 7.4.  SSI.  Patient on Glucotrol 5 mg daily, Glucophage 500 mg twice daily prior to admission.  Resume as needed CBG (last 3)  Recent Labs    10/09/20 1225 10/09/20 1625 10/10/20 0607  GLUCAP 137* 106* 99    Controlled on 8/16 9.  CKD stage III.    Creatinine 1.57 on 7/27, labs ordered for tomorrow 10.  Hyperlipidemia.  Lipitor 11.  COPD/tobacco abuse. Pt states he quite smoking 5 wks PTA  Continue inhalers as directed.occ wheezing after therapy , advised pt to request prn inhaler  12.  Diastolic congestive heart failure.  No signs of fluid  overload. 13.  Obesity.  BMI 35.25.  Dietary follow-up 14.  Incidental thyroid nodule measuring 1.5 cm.  Follow-up outpatient thyroid ultrasound 15.  Essential hypertension.  Currently on Hygroton 25 mg daily.  Patient also had been on hydralazine 50 mg twice daily, Imdur 60 mg daily Cozaar 100 mg daily prior to admission.     Vitals:   10/09/20 2110 10/10/20 0421  BP: 138/88 (!) 136/91  Pulse: 76 71  Resp:  16  Temp:  97.8 F (36.6 C)  SpO2:  99%  Borderline diastolic elevation but systolic well controlled   16. Had BM's 8/6 after sorbitol, last BM  8/7 repeat sorbitol and increased senna S to BID 17. Right hand tremor, intermittent, with activity: discussed propanolol but he does not feel he needs at this time.  18.  Post stroke dysphagia  D3 thins, advance diet as tolerated   LOS: 21 days A FACE TO [place] E Karron Goens 10/10/2020, 7:45 AM

## 2020-10-11 LAB — GLUCOSE, CAPILLARY
Glucose-Capillary: 120 mg/dL — ABNORMAL HIGH (ref 70–99)
Glucose-Capillary: 108 mg/dL — ABNORMAL HIGH (ref 70–99)
Glucose-Capillary: 194 mg/dL — ABNORMAL HIGH (ref 70–99)
Glucose-Capillary: 114 mg/dL — ABNORMAL HIGH (ref 70–99)

## 2020-10-11 NOTE — Progress Notes (Signed)
Speech Language Pathology Discharge Summary  Patient Details  Name: Craig Knight MRN: [id number] Date of Birth: 18-Sep-1965  Today's Date: 10/11/2020 SLP Individual Time: 1115-1145 SLP Individual Time Calculation (min): 30 min  Skilled Therapeutic Interventions:  Patient seen for skilled ST session focused on cognitive function goals. Patient initially not verbally responding when SLP introduces self and he would appear to be suspicious and guarded in his responses. He did eventual tell SLP that he has a lot on his mind and he feels that he "cant do anything" and is upset that he can't help his parents like he had promised he would. SLP mentioned that there is a psychologist who helps patients with coping, however patient instantly became upset and adamant that he would not talk to a psychologist, "I'll cuss him out". Patient was noticeably upset and then he would not participate further.   Patient has met 5 of 5 long term goals.  Patient to discharge at overall Supervision;Min level.   Reasons goals not met: NA   Clinical Impression/Discharge Summary: Patient has made functional gains and has met 5 of 5 long-term goals this admission due to improved oropharyngeal swallow function, speech intelligibility with implementation of speech strategies, and cognitive-linguistic skills. He currently requires sup-min A verbal and visual cues for problem solving, short-term recall, and emergent awareness. Spouse endorsed patient is near baseline function cognitively with the exception of decreased recall. Requires sup A verbal cues for implementation of speech intelligibility strategies which fluctuates (ranging from sup-to-min A) and appears dependent on mood and inconsistent motivation. Patient will discharge home with Dys 3 diet and thin liquids. He is appropriate for diet advancement to regular consistencies with adherence to safe swallow precautions, however indicates preference for Dys 3 textures.  Education has been completed with spouse who he will be returning home to with 24 hour supervision. Follow up SLP services are recommended if patient is agreeable to further maximize speech and cognitive functions.    Care Partner:  Caregiver Able to Provide Assistance: Yes  Type of Caregiver Assistance: Physical;Cognitive  Recommendation:  Outpatient SLP;24 hour supervision/assistance (if agreeable)  Rationale for SLP Follow Up: Maximize functional communication;Maximize cognitive function and independence   Equipment: NA   Reasons for discharge: Treatment goals met   Patient/Family Agrees with Progress Made and Goals Achieved: Yes    Patty Sermons 10/11/2020, 1:23 PM  Sonia Baller, MA, CCC-SLP Speech Therapy

## 2020-10-11 NOTE — Progress Notes (Signed)
Physical Therapy Session Note  Patient Details  Name: Craig Knight MRN: [id number] Date of Birth: September 19, 1965  Today's Date: 10/11/2020 PT Individual Time: [id number] and 1350-1503 PT Individual Time Calculation (min): 40 min and 73 min  Short Term Goals: Week 3:  PT Short Term Goal 1 (Week 3): = to LTGs based on ELOS  Skilled Therapeutic Interventions/Progress Updates:    Session 1: Pt received sitting in w/c and in a down mood today but agreeable to therapy session. Transported to/from gym in w/c for time management and energy conservation. Pt already wearing R LE [employer] Sprystep Plus GRAFO therefore donned toe cover for improved swing phase advancement during gait. Sit<>stands w/c<>RW with min assist for balance during session - pt able to manage R hand on/off RW orthotic with min assist for standing balance and allowing increased time. Gait training 76f x3 using RW (R hand orthotic) with mod assist of 1 for balance and R LE gait mechanics - continues to require mod/max assist to advance R LE during swing phase due to toe drag but pt is able to get it ~50% of the way through around 50% of the time - during 1st walk pt keeps R knee flexed during stance and has poor R anterior pelvic translation over stance limb but with cuing pt able to improve during 2nd and 3rd walk and responds best to verbal cue of "stand tall." L hemi-technique w/c propulsion ~368ftowards room with supervision but then pt reporting fatigued therefore transported remainder of distance back to room dependently. Pt left seated in w/c with needs in reach and chair alarm on.  Session 2: Pt received sitting in w/c with his wife, LiLattie Hawpresent for hands-on education/training and pt agreeable to therapy session. ChGerald Stabsorthotist has delivered pt's ThTenneco Incnd has taken pt's new R shoe to place toe cap. LiLattie Hawemonstrated carryover of education from OT session regarding w/c part management (brakes and  donning/doffing leg rests) with max cuing. Educated on proper use of gait belt to be used for all mobility tasks.Therapist educated on proper set-up of w/c for transfer to EOB ([facility]ed [facility] isn't room for w/c next [facility]) - including managing w/c leg rests, brakes, and flip back armrests. Therapist demonstrated assisting pt with L stand pivot transfer w/c>EOB and then R squat/stand pivot EOB>w/c - pt auto-selecting to perform stand pivot towards L despite therapist cuing for squat; however, pt demos ability to complete this safely with min assist. Educated on head-hips relationship and ensuring pt's wife allows enough room for pt to lean trunk anteriorly when coming to stand. Discussed pt requiring increased assist transferring towards R compared to L as well as importance of ensuring proper R LE foot placement and use of GRAFO.   Pt performed block practice L stand pivot w/c>EOB and R squat/stand pivot transfer EOB>w/c with pt's wife providing proper hands-on min assist with mod/max cuing from therapist.  [place] [facility] to perform real life car transfer.   Block practice 2x car transfer with education on proper set-up and positioning of w/c and car as well as proper positioning of pt's wife to provide assistance. Pt completed stand pivot w/c<>sedan height car with pt's wife providing min assist both times with therapist only providing verbal cuing. Pt/wife demo good recall/carryover from 1st trial to the 2nd. Pt/wife report no questions/concerns regarding this.  Transported back up to CIR. L hemi-technique  w/c propulsion ~27f with supervision and frequent rest breaks due to pt reporting asthma symptoms. Max cuing for set-up of wheelchair for transfer back to EOB. Pt's wife provided min assist for L stand pivot w/c>EOB 2x and R stand pivot EOB>w/c 1x with mod cuing from therapist focusing on carryover of  education from earlier in session. Pt left seated EOB with needs in reach, bed alarm on, and pt's wife present.  Therapy Documentation Precautions:  Precautions Precautions: Fall, Other (comment) Precaution Comments: L hemianopsia; dense R hemiparesis Restrictions Weight Bearing Restrictions: No   Pain:  Session 1: No reports of pain throughout session.  Session 2: No reports of pain throughout session.    Therapy/Group: Individual Therapy  CTawana Scale, PT, DPT, NCS, CSRS 10/11/2020, 8:03 AM

## 2020-10-11 NOTE — Plan of Care (Signed)
  Problem: RH Swallowing Goal: LTG Patient will consume least restrictive diet using compensatory strategies with assistance (SLP) Description: LTG:  Patient will consume least restrictive diet using compensatory strategies with assistance (SLP) Outcome: Completed/Met Goal: LTG Pt will demonstrate functional change in swallow as evidenced by bedside/clinical objective assessment (SLP) Description: LTG: Patient will demonstrate functional change in swallow as evidenced by bedside/clinical objective assessment (SLP) Outcome: Completed/Met   Problem: RH Expression Communication Goal: LTG Patient will increase speech intelligibility (SLP) Description: LTG: Patient will increase speech intelligibility at word/phrase/conversation level with cues, % of the time (SLP) Outcome: Completed/Met   Problem: RH Problem Solving Goal: LTG Patient will demonstrate problem solving for (SLP) Description: LTG:  Patient will demonstrate problem solving for basic/complex daily situations with cues  (SLP) Outcome: Completed/Met   Problem: RH Memory Goal: LTG Patient will demonstrate ability for day to day (SLP) Description: LTG:   Patient will demonstrate ability for day to day recall/carryover during cognitive/linguistic activities with assist  (SLP) Outcome: Completed/Met Goal: LTG Patient will use memory compensatory aids to (SLP) Description: LTG:  Patient will use memory compensatory aids to recall biographical/new, daily complex information with cues (SLP) Outcome: Completed/Met

## 2020-10-11 NOTE — Progress Notes (Addendum)
PROGRESS NOTE   Subjective/Complaints:  Appreciate PT, note, pt anxious to go home , occ incont of urine  ROS: Denies CP, SOB, N/V/D  Objective:   No results found. No results for input(s): WBC, HGB, HCT, PLT in the last 72 hours. No results for input(s): NA, K, CL, CO2, GLUCOSE, BUN, CREATININE, CALCIUM in the last 72 hours.  Intake/Output Summary (Last 24 hours) at 10/11/2020 0757 Last data filed at 10/11/2020 0505 Gross per 24 hour  Intake 15 ml  Output 500 ml  Net -485 ml         Physical Exam: Vital Signs Blood pressure (!) 149/85, pulse 72, temperature 99.3 F (37.4 C), temperature source Oral, resp. rate 17, height _0  (1.702 m), weight 89.3 kg, SpO2 97 %.  General: No acute distress Mood and affect are appropriate Heart: Regular rate and rhythm no rubs murmurs or extra sounds Lungs: Clear to auscultation, breathing unlabored, no rales or wheezes Abdomen: Positive bowel sounds, soft nontender to palpation, nondistended Extremities: No clubbing, cyanosis, or edema Skin: No evidence of breakdown, no evidence of rash  Neuro: Alert Dysarthria, unchanged Motor: LUE/LE: 5/5 proximal distal RUE/RLE: 0/5 proximal distal, unchanged  Assessment/Plan: 1. Functional deficits which require 3+ hours per day of interdisciplinary therapy in a comprehensive inpatient rehab setting. Physiatrist is providing close team supervision and 24 hour management of active medical problems listed below. Physiatrist and rehab team continue to assess barriers to discharge/monitor patient progress toward functional and medical goals  Care Tool:  Bathing    Body parts bathed by patient: Chest, Abdomen, Right lower leg, Left lower leg, Right upper leg, Left upper leg, Front perineal area, Buttocks, Face   Body parts bathed by helper: Right arm, Left arm     Bathing assist Assist Level: Minimal Assistance - Patient > 75%      Upper Body Dressing/Undressing Upper body dressing   What is the patient wearing?: Pull over shirt    Upper body assist Assist Level: Minimal Assistance - Patient > 75%    Lower Body Dressing/Undressing Lower body dressing      What is the patient wearing?: Pants, Underwear/pull up     Lower body assist Assist for lower body dressing: Moderate Assistance - Patient 50 - 74%     Toileting Toileting    Toileting assist Assist for toileting: Minimal Assistance - Patient > 75% (for use of the urinal) Assistive Device Comment: urinal   Transfers Chair/bed transfer  Transfers assist     Chair/bed transfer assist level: Minimal Assistance - Patient > 75% (squat pivot)     Locomotion Ambulation   Ambulation assist   Ambulation activity did not occur: Safety/medical concerns  Assist level: Minimal Assistance - Patient > 75% Assistive device: Walker-rolling Max distance: 43f   Walk 10 feet activity   Assist  Walk 10 feet activity did not occur: Safety/medical concerns  Assist level: Minimal Assistance - Patient > 75% Assistive device: Walker-rolling   Walk 50 feet activity   Assist Walk 50 feet with 2 turns activity did not occur:  (not turning yet)  Assist level: 2 helpers (mod A of 1 and +2 min A) Assistive device:  Hand held assist    Walk 150 feet activity   Assist Walk 150 feet activity did not occur: Safety/medical concerns         Walk 10 feet on uneven surface  activity   Assist Walk 10 feet on uneven surfaces activity did not occur: Safety/medical concerns         Wheelchair     Assist Will patient use wheelchair at discharge?:  (TBD) Type of Wheelchair: Manual    Wheelchair assist level: Supervision/Verbal cueing Max wheelchair distance: 67'    Wheelchair 50 feet with 2 turns activity    Assist        Assist Level: Supervision/Verbal cueing   Wheelchair 150 feet activity     Assist      Assist Level:  Dependent - Patient 0%   Blood pressure (!) 149/85, pulse 72, temperature 99.3 F (37.4 C), temperature source Oral, resp. rate 17, height _0  (1.702 m), weight 89.3 kg, SpO2 97 %.    Assessment/plan: 1.  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 Refused loop recorder and ZIO patch added. No UE or LE movement so far on RIght side   Continue CIR PT, OT, SLP Team conference today please see physician documentation under team conference tab, met with team  to discuss problems,progress, and goals. Formulized individual treatment plan based on medical history, underlying problem and comorbidities. Pt wants to go home sooner than 8/20 but has several equipment items that may require additional assessment and training  2.  Impaired mobility -DVT/anticoagulation: continue Lovenox             -antiplatelet therapy: Aspirin 81 mg daily and Brilinta 90 mg twice daily for 30 days then back to aspirin and Plavix DAPT  3. Neuropathic pain: Continue Neurontin 300 mg nightly  4. Mood/reactive depression/insomnia: Continue Trazodone 100 mg nightly, having some adjustment issues , would like neuropsych to follow , some hangover effect with higher trazodone dose reduced to 79m  Irritable at times , on celexa 135mper day, stable on 8/14          May be brighter on reduced trazodone  5. Neuropsych: This patient is ?fully capable of making decisions on his own behalf. 6. Skin/Wound Care: Routine skin checks 7. Fluids/Electrolytes/Nutrition: Routine in and outs with follow-up chemistries 8.  Diabetes mellitus and peripheral neuropathy.  Hemoglobin A1c 7.4.  SSI.  Patient on Glucotrol 5 mg daily, Glucophage 500 mg twice daily prior to admission.  Resume as needed CBG (last 3)  Recent Labs    10/10/20 1639 10/10/20 2100 10/11/20 0623  GLUCAP 126* 122* 120*    Controlled on  8/17 9.  CKD stage III.    At baseline 10.  Hyperlipidemia.  Lipitor 11.  COPD/tobacco abuse. Pt states he quite smoking 5 wks PTA  Continue inhalers as directed.occ wheezing after therapy , advised pt to request prn inhaler  12.  Diastolic congestive heart failure.  No signs of fluid overload. 13.  Obesity.  BMI 35.25.  Dietary follow-up 14.  Incidental thyroid nodule measuring 1.5 cm.  Follow-up outpatient thyroid ultrasound 15.  Essential hypertension.  Currently on Hygroton 25 mg daily.  Patient also had been on hydralazine 50 mg twice daily, Imdur 60 mg daily Cozaar 100 mg daily prior to admission.     Vitals:   10/10/20 2006 10/11/20 0506  BP: (!) 131/97 (!) 149/85  Pulse: 84  72  Resp: 18 17  Temp: 97.8 F (36.6 C) 99.3 F (37.4 C)  SpO2: 98% 97%  Some lability but adequate control  16. Had BM's 8/6 after sorbitol, last BM 8/7 repeat sorbitol and increased senna S to BID  17.  Post stroke dysphagia  D3 thins, advance diet as tolerated   LOS: 22 days A FACE TO [place] E Lockie Bothun 10/11/2020, 7:57 AM

## 2020-10-11 NOTE — Progress Notes (Signed)
Patient ID: Craig Knight, male   DOB: August 31, 1965, 55 y.o.   MRN: [id number]  SW covering for primary [place].   SW met with pt and pt wife in room to provide updates from team conference, and d/c date now 8/19. Pt aware outpatient therapies set up with [facility]. Pt aware he will get loaner w/c from [employer] and we will ask if it can be delivered by Friday. SW stressed to pt, he cannot leave unless he has w/c. A little reluctant to the idea.   SW updated [employer] on change in pt discharge date.   Loralee Pacas, MSW, [place] Office: [phone] Cell: [phone] Fax: [phone]

## 2020-10-11 NOTE — Progress Notes (Signed)
Occupational Therapy Session Note  Patient Details  Name: Craig Knight MRN: [id number] Date of Birth: Nov 21, 1965  Today's Date: 10/11/2020 OT Individual Time: 0801-0901 OT Individual Time Calculation (min): 60 min    Short Term Goals: Week 3:  OT Short Term Goal 1 (Week 3): Continue working on established LTGs set at min assist overall.  Skilled Therapeutic Interventions/Progress Updates:    Session 1: [id number])  Pt sitting EOB to start with flat affect.  He was agreeable to therapy with max assist for donning his socks and shoes with AFO on the right shoe.  He completed stand pivot transfer to the wheelchair with min assist and then was transitioned over to the sink to complete grooming tasks.  Setup for oral hygiene, brushing his hair, and washing his face.  Next, took him down to the ortho gym where he worked on Clinical research associate with use of the UE ergonometer.  Ace bandage used to help keep the RUE on the hand grip for intervals of 3 mins for 3 sets.  First set completed with BUE use at supervision.  Max assist needed for the last 2 sets with isolated use of the RUE only.  Finished session with application of NMES to the right forearm for facilitation of digit extensors.  He was able to tolerate for 5 mins without any pain or adverse reactions.  Pt left in wheelchair at bedside with the call button and phone in reach with safety alarm belt in place.    Session 2: [id number])  Pt's spouse present for session with focus on education in preparation for discharge home.  Had him work initially on donning a pullover tank top with explanation to start with the RUE first, then the left, and end with pulling it over the head.  Pt with some increased frustration with task initially as he demonstrated difficulty but was eventually able to complete task with min assist.  Therapist provided total assist for donning the right shoe and AFO with pt donning the left shoe and therapist tying it.   Next, demonstrated squat pivot and stand pivot transfers to the drop arm commode with min assist.  Had spouse assist with transfer stand pivot which was mod to max assist secondary to pt falling to the right and therapist having to help correct his balance.  Secondary to time, did not get a chance to practice further transfers, but plan is for pt's spouse to come in tomorrow for further education. She is in agreement with this as well with scheduling being notified.       Therapy Documentation Precautions:  Precautions Precautions: Fall, Other (comment) Precaution Comments: L hemianopsia; dense R hemiparesis Restrictions Weight Bearing Restrictions: No  Pain: Pain Assessment Pain Scale: Faces Pain Score: 0-No pain ADL: See Care Tool Section for some details of mobility and selfcare   Therapy/Group: Individual Therapy  Jesenia Spera OTR/L 10/11/2020, 10:36 AM

## 2020-10-11 NOTE — Patient Care Conference (Signed)
Inpatient RehabilitationTeam Conference and Plan of Care Update Date: 10/11/2020   Time: 10:39 AM    Patient Name: Craig Knight      Medical Record Number: [id number]  Date of Birth: 04-14-1965 Sex: Male         Room/Bed: 4M05C/4M05C-01 Payor Info: Payor: [employer] / Plan: [employer] MEDICARE / Product Type: *No Product type* /    Admit Date/Time:  09/19/2020  3:19 PM  Primary Diagnosis:  Infarction of left basal ganglia [facility])  Hospital Problems: Principal Problem:   Infarction of left basal ganglia (HCC) Active Problems:   DM type 2 (diabetes mellitus, type 2) ([place])   CKD (chronic kidney disease), stage III ([place])   Essential hypertension   Diabetic peripheral neuropathy ([place])   Dysphagia, post-stroke    Expected Discharge Date: Expected Discharge Date: 10/13/20  Team Members Present: Physician leading conference: Dr. Alysia Penna Social Worker Present: Loralee Pacas, [place] Nurse Present: Dorien Chihuahua, RN PT Present: Page Spiro, PT OT Present: Clyda Greener, OT SLP Present: Sherren Kerns, SLP PPS Coordinator present : Gunnar Fusi, SLP     Current Status/Progress Goal Weekly Team Focus  Bowel/Bladder   Patient is continent x2. LBM: 10/08/20.  Patient will remain continent x2.  Nursing staff will assist patient with toileting q2h and prn.   Swallow/Nutrition/ Hydration   Dys 3 diet, thin liquids. Mod I. Mildly decreased mastication  Mod I  Continue Dys 3 diet, thin liquids per patient preference.   ADL's   Min asssit for UB and LB bathing.  Min assist for UB dressing with mod assist for LB dressing.  Min assist for transfers to the 3:1 and the wheelchair.  Left visual field deficit from recent CVA with moderate RUE and RLE hemiparesis.  Depressed mood         Mobility   mod assist bed mobility, min assist sit<>stands using RW, min assist squat pivot transfers, mod assist of 1 ambulating up to 29f using RW wearing R LE GRAFO and requiring max assist  to advance R LE during swing, supervision ~1063fL hemi-technique w/c propulsion - completed AFO consultation and custom wheelchair consultation  min/mod assist overall at wheelchair level  activity tolerance, R LE NMR, transfer training, gait training, dynamic standing balance, pt/family education, DME education, D/C planning, wheelchair management and propulsion   Communication   sup-to-min A verbal cues to implement speech intelligibility strategies  Sup A  No longer addressing speech goals due to non-compliance and poor motivation   Safety/Cognition/ Behavioral Observations  min A  Min A (baseline deficits per spouse)  recall, anticipatory problem solving, reasoning   Pain   Patient currently denies pain.  Pain level of <=2  Nursing staff will assess pain level q shift and prn. Administer prn pain medications per patient request.   Skin   Skin is currently intact.  Skin will remain intact.  Nursing staff will assess patient's skin q shift and prn.     Discharge Planning:  Pt tp d/c to home with wife to provide 24/7 care.   Team Discussion: Patient anxious to go home sooner than scheduled; medically stable per MD. Patient continues to display a flat affect and depressed mood with inconsistent communications.   Patient on target to meet rehab goals: yes, currently min assist for upper body care and mod assist for lower body dressing  with max assist requires for leg involvement. Goals for discharge set at min assist overall.  *See Care Plan and progress notes  for long and short-term goals.   Revisions to Treatment Plan:  W/C eval and AFO consult with toe cap for shoe completed Eligable for power chair; to go home with loaner chair and continue process for own wheelchair after discharge Do not recommend patient ambulate with the wife due to safety concerns and difficulty advancing the right leg.  Trial regular consistency diet, patient prefers D3 thin liquid diet  Teaching  Needs: Safety, medications, secondary stroke risk management, transfers, etc  Current Barriers to Discharge: Decreased caregiver support, Home enviroment access/layout, and Behavior  Possible Resolutions to Barriers: HH follow up services Family education     Medical Summary Current Status: Patient requesting early discharge.  Still having orthotic consult does not have AFO yet.  Hypertension still with some lability  Barriers to Discharge: Medication compliance   Possible Resolutions to [employer] Focus: Orthotic consult, equipment needed prior to discharge   Continued Need for Acute Rehabilitation Level of Care: The patient requires daily medical management by a physician with specialized training in physical medicine and rehabilitation for the following reasons: Direction of a multidisciplinary physical rehabilitation program to maximize functional independence : Yes Medical management of patient stability for increased activity during participation in an intensive rehabilitation regime.: Yes Analysis of laboratory values and/or radiology reports with any subsequent need for medication adjustment and/or medical intervention. : Yes   I attest that I was present, lead the team conference, and concur with the assessment and plan of the team.   Dorien Chihuahua B 10/11/2020, 11:41 AM

## 2020-10-11 NOTE — Discharge Summary (Signed)
Physician Discharge Summary  Patient ID: Craig Knight MRN: [id number] DOB/AGE: April 18, 1965 55 y.o.  Admit date: 09/19/2020 Discharge date: 10/13/2020  Discharge Diagnoses:  Principal Problem:   Infarction of left basal ganglia (HCC) Active Problems:   DM type 2 (diabetes mellitus, type 2) (HCC)   CKD (chronic kidney disease), stage III (HCC)   Essential hypertension   Diabetic peripheral neuropathy (HCC)   Dysphagia, post-stroke Mood stabilization COPD with tobacco abuse Obesity Incidental thyroid nodule Diastolic congestive heart failure  Discharged Condition: Stable  Significant Diagnostic Studies: DG Chest 2 View  Result Date: 09/13/2020 CLINICAL DATA:  Shortness of breath.  Right-sided weakness. EXAM: CHEST - 2 VIEW COMPARISON:  Radiograph 09/29/2018, chest CT 06/27/2020 FINDINGS: Lung volumes are low. Mild cardiomegaly. Bandlike scarring at the left lung base. No acute or confluent airspace disease. No pulmonary edema. No pleural effusion or pneumothorax. No acute osseous abnormalities are seen. IMPRESSION: Low lung volumes with mild cardiomegaly and left basilar scarring. Electronically Signed   By: Keith Rake M.D.   On: 09/13/2020 19:57   CT Head Wo Contrast  Result Date: 09/13/2020 CLINICAL DATA:  Right leg weakness EXAM: CT HEAD WITHOUT CONTRAST TECHNIQUE: Contiguous axial images were obtained from the base of the skull through the vertex without intravenous contrast. COMPARISON:  MRI 09/07/2020, CT 09/06/2020 FINDINGS: Brain: Mild residual hypodensity within the right occipital and posterior temporal lobes as well as splenium corpus callosum consistent with evolving right PCA infarct. No definitive hemorrhage seen. Hypodensity in the left pons, also consistent with evolving infarct. Negative for intracranial mass. Chronic left frontal lobe infarct. Chronic bilateral white matter and basal ganglial infarcts. Chronic lacunar infarcts in the thalamus. Stable ventricle  size. Chronic small vessel ischemic changes of the white matter. Vascular: No hyperdense vessels. Mild carotid vascular calcification. Vertebral artery calcification Skull: Normal. Negative for fracture or focal lesion. Sinuses/Orbits: Mucosal thickening in the sinuses Other: None IMPRESSION: 1. Evolving right PCA infarct without interval hemorrhage. Evolving left pontine infarct without interval hemorrhage. No definite new acute intracranial abnormality since prior study from 09/06/2020. 2. Atrophy with chronic small vessel ischemic changes of the white matter. Multiple chronic infarcts involving left frontal lobe, bilateral white matter, basal ganglia and thalamus. Electronically Signed   By: Donavan Foil M.D.   On: 09/13/2020 20:24   MR ANGIO HEAD WO CONTRAST  Result Date: 09/15/2020 CLINICAL DATA:  Stroke EXAM: MRA HEAD WITHOUT CONTRAST TECHNIQUE: Angiographic images of the Circle of Willis were acquired using MRA technique without intravenous contrast. COMPARISON:  MRI head 09/15/2020.  CT a 09/06/2020 FINDINGS: Anterior circulation: Motion degraded study. This significantly limits evaluation for small vessel disease. Internal carotid artery patent bilaterally without significant stenosis. Irregular areas of stenosis in the anterior and middle cerebral artery branches bilaterally consistent with widespread atherosclerotic disease. This is confirmed on the prior CTA. No large vessel occlusion. Posterior circulation: Both vertebral arteries are patent to the basilar. Basilar patent. Superior cerebellar arteries patent bilaterally. Occlusion of the proximal right posterior cerebral artery corresponding to right PCA infarct on MRI. There is severe stenosis of the left posterior cerebral artery which has distal flow. This appears progressive from the prior CT angiogram. Differences could be due to artifact from motion. Anatomic variants: None Other: None IMPRESSION: Motion degraded study Diffuse intracranial  atherosclerotic disease, moderate to severe Occlusion right PCA. Severe stenosis left PCA with distal flow. This may have progressed since prior studies however is difficult to compare given the amount of motion on the MRA.  Electronically Signed   By: Franchot Gallo M.D.   On: 09/15/2020 16:32   MR BRAIN WO CONTRAST  Result Date: 09/17/2020 CLINICAL DATA:  Stroke follow-up EXAM: MRI HEAD WITHOUT CONTRAST TECHNIQUE: Multiplanar, multiecho pulse sequences of the brain and surrounding structures were obtained without intravenous contrast. COMPARISON:  Two days ago FINDINGS: Brain: Acute left perforator infarct affecting the corona radiata and caudate body. More weakly restricted diffusion in the right PCA territory at the occipital lobe and right posterior corpus callosum. Subacute perforator infarct at the left pons. No interval infarction is seen. No hemorrhagic complication. Remote left frontal operculum infarct. Chronic small vessel ischemia and lacunar infarcts. Vascular: No available T2 weighted imaging Skull and upper cervical spine: Negative Sinuses/Orbits: Negative Other: Progressively motion degraded. Truncated study per clinical request. IMPRESSION: Unchanged acute and subacute infarcts involving the infra and supratentorial brain. Electronically Signed   By: Monte Fantasia M.D.   On: 09/17/2020 06:17   MR BRAIN WO CONTRAST  Result Date: 09/15/2020 CLINICAL DATA:  Transient ischemic attack (TIA). Right leg weakness. EXAM: MRI HEAD WITHOUT CONTRAST TECHNIQUE: Multiplanar, multiecho pulse sequences of the brain and surrounding structures were obtained without intravenous contrast. COMPARISON:  Head CT 09/13/2020 and MRI 09/07/2020 FINDINGS: Brain: There is a new acute left lateral lenticulostriate territory infarct extending from the posterior aspect of the left lentiform nucleus to the caudate body and involving the intervening deep white matter. There is residual diffusion weighted signal  abnormality at the sites of the posterior circulation infarcts on the prior MRI (right temporal and occipital lobes, splenium of the corpus callosum, and left pons) without evidence of significant interval infarct extension. There is minimal petechial hemorrhage in the right occipital lobe. A chronic microhemorrhage is noted in the anterior right frontal lobe. Chronic lacunar infarcts are again noted involving the deep cerebral white matter bilaterally, basal ganglia, and thalami, and there is a chronic moderate-sized left MCA infarct near the anterior aspect of the left frontal operculum. T2 hyperintensities elsewhere in the cerebral white matter bilaterally are unchanged and nonspecific but compatible with moderate chronic small vessel ischemic disease. No mass, midline shift, or extra-axial fluid collection is identified. There is mild cerebral atrophy. Vascular: Major intracranial vascular flow voids are preserved. Skull and upper cervical spine: Unremarkable bone marrow signal. Sinuses/Orbits: Unremarkable orbits. Minimal mucosal thickening or small mucous retention cyst in left sphenoid sinus. Small right and moderate left mastoid effusions. Other: None. IMPRESSION: 1. Acute left basal ganglia infarct. 2. Evolving subacute posterior circulation infarcts without extension from the 09/07/2020 MRI. 3. 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 Electronically Signed   By: Logan Bores M.D.   On: 09/15/2020 14:51   DG CHEST PORT 1 VIEW  Result Date: 09/22/2020 CLINICAL DATA:  Shortness of breath. EXAM: PORTABLE CHEST 1 VIEW COMPARISON:  Single view of the chest 09/15/2020. FINDINGS: The heart size and mediastinal contours are within normal limits. The lungs are clear. The visualized skeletal structures are unremarkable. IMPRESSION: Negative chest. Electronically Signed   By: Inge Rise M.D.   On: 09/22/2020 17:08   DG Chest Port 1 View  Result Date: 09/15/2020 CLINICAL  DATA:  Pt has history of 4 previous strokes since Dec. Pt typically has SOB, but worsened yesterday. Hx of COPD, diabetes, HTN. Current smoker. EXAM: PORTABLE CHEST 1 VIEW COMPARISON:  Chest x-ray 09/13/2020, CT chest 06/27/2020 FINDINGS: Enlarged cardiac silhouette. The heart size and mediastinal contours are unchanged. No focal consolidation. No pulmonary edema. No  pleural effusion. No pneumothorax. No acute osseous abnormality. IMPRESSION: No active disease. Electronically Signed   By: Iven Finn M.D.   On: 09/15/2020 15:32   ECHO TEE  Result Date: 09/18/2020    TRANSESOPHOGEAL ECHO REPORT   Patient Name:   Craig Knight Date of Exam: 09/18/2020 Medical Rec #:  [id number]       Height:       67.0 in Accession #:    [id number]      Weight:       225.0 lb Date of Birth:  1965/08/28       BSA:          2.126 m Patient Age:    12 years        BP:           122/64 mmHg Patient Gender: M               HR:           78 bpm. Exam Location:  Inpatient Procedure: Transesophageal Echo, Cardiac Doppler, Color Doppler and Saline            Contrast Bubble Study Indications:     CVA  History:         Patient has prior history of Echocardiogram examinations, most                  recent 09/07/2020. CHF, COPD and Stroke, Signs/Symptoms:CKD;                  Risk Factors:Hypertension, Diabetes and Current Smoker.  Sonographer:     Dustin Flock Referring Phys:  Lyman Bishop MD Diagnosing Phys: Lyman Bishop MD PROCEDURE: The transesophogeal probe was passed without difficulty through the esophogus of the patient. Sedation performed by different physician. The patient was monitored while under deep sedation. Anesthestetic sedation was provided intravenously by Anesthesiology: 390.'09mg'$  of Propofol. The patient developed no complications during the procedure. IMPRESSIONS  1. Left ventricular ejection fraction, by estimation, is 60 to 65%. The left ventricle has normal function. The left ventricle has no regional wall  motion abnormalities. There is severe left ventricular hypertrophy.  2. Right ventricular systolic function is normal. The right ventricular size is normal.  3. No left atrial/left atrial appendage thrombus was detected.  4. The mitral valve is grossly normal. Trivial mitral valve regurgitation.  5. The aortic valve is tricuspid. Aortic valve regurgitation is mild. Conclusion(s)/Recommendation(s): No LA/LAA thrombus identified. Negative bubble study for interatrial shunt. No intracardiac source of embolism detected on this on this transesophageal echocardiogram. FINDINGS  Left Ventricle: Left ventricular ejection fraction, by estimation, is 60 to 65%. The left ventricle has normal function. The left ventricle has no regional wall motion abnormalities. The left ventricular internal cavity size was normal in size. There is  severe left ventricular hypertrophy. Right Ventricle: The right ventricular size is normal. No increase in right ventricular wall thickness. Right ventricular systolic function is normal. Left Atrium: Left atrial size was normal in size. No left atrial/left atrial appendage thrombus was detected. Right Atrium: Right atrial size was normal in size. Pericardium: There is no evidence of pericardial effusion. Mitral Valve: The mitral valve is grossly normal. Trivial mitral valve regurgitation. Tricuspid Valve: The tricuspid valve is grossly normal. Tricuspid valve regurgitation is trivial. Aortic Valve: The aortic valve is tricuspid. Aortic valve regurgitation is mild. Pulmonic Valve: The pulmonic valve was grossly normal. Pulmonic valve regurgitation is trivial. Aorta: The aortic root and ascending aorta are  structurally normal, with no evidence of dilitation. IAS/Shunts: No atrial level shunt detected by color flow Doppler. Agitated saline contrast was given intravenously to evaluate for intracardiac shunting. Lyman Bishop MD Electronically signed by Lyman Bishop MD Signature Date/Time:  09/18/2020/2:16:58 PM    Final    CT ANGIO HEAD NECK W WO CM W PERF  Result Date: 09/16/2020 CLINICAL DATA:  Stroke follow-up.  Right arm numbness. EXAM: CT ANGIOGRAPHY HEAD AND NECK CT PERFUSION BRAIN TECHNIQUE: Multidetector CT imaging of the head and neck was performed using the standard protocol during bolus administration of intravenous contrast. Multiplanar CT image reconstructions and MIPs were obtained to evaluate the vascular anatomy. Carotid stenosis measurements (when applicable) are obtained utilizing NASCET criteria, using the distal internal carotid diameter as the denominator. Multiphase CT imaging of the brain was performed following IV bolus contrast injection. Subsequent parametric perfusion maps were calculated using RAPID software. CONTRAST:  180m OMNIPAQUE IOHEXOL 350 MG/ML SOLN COMPARISON:  Head MRI and MRA 09/15/2020. Head and neck CTA 09/06/2020. FINDINGS: CT HEAD FINDINGS Brain: Hypodensity in the posterior left basal ganglia/posterior limb of internal capsule/corona radiata corresponds to the acute infarct which is better demonstrated on yesterday's MRI. No acute cortically based infarct, mass, midline shift, or extra-axial fluid collection is identified. Multiple subacute posterior circulation infarcts are again noted involving the posteromedial right temporal and occipital lobes, splenium of the corpus callosum, and left pons with minimal petechial hemorrhage again noted in the right occipital lobe. There is a chronic moderate-sized left frontal lobe MCA infarct, and there are moderate chronic small-vessel changes in the cerebral white matter with chronic lacunar infarcts in the deep cerebral white matter bilaterally, basal ganglia, and thalami. There is mild cerebral atrophy. Vascular: No hyperdense vessel. Skull: No fracture or suspicious osseous lesion. Sinuses/Orbits: Small mucous retention cyst in the left sphenoid sinus. Small to moderate mastoid effusions. Unremarkable orbits.  Other: None. CTA NECK FINDINGS Aortic arch: Normal variant aortic arch branching pattern with common origin of the brachiocephalic and left common carotid arteries. Widely patent arch vessel origins. Right carotid system: Patent without evidence of stenosis, dissection, or significant atherosclerosis. Tortuous cervical ICA. Left carotid system: Patent with a small amount of calcified plaque at the carotid bifurcation. No evidence of a significant stenosis or dissection. Tortuous cervical ICA. Vertebral arteries: Patent and codominant without evidence of a significant stenosis or dissection. Skeleton: Mild cervical spondylosis. Other neck: Bilateral thyroid nodules measuring up to 1.5 cm. No evidence of cervical lymphadenopathy. Upper chest: Clear lung apices. Review of the MIP images confirms the above findings CTA HEAD FINDINGS Anterior circulation: The internal carotid arteries are patent from skull base to carotid termini with mild atherosclerosis not resulting in a significant stenosis. ACAs and MCAs are patent without evidence of a proximal branch occlusion. There is widespread atherosclerotic type irregularity including an unchanged mild proximal left M1 stenosis. Severe left A3 stenoses appear increased from the prior CTA. No aneurysm is identified. Posterior circulation: The intracranial vertebral arteries are patent to the basilar with atherosclerotic plaque resulting in an unchanged moderate stenosis on the left. The a severe proximal right AICA stenosis is again seen. Patent SCA is are identified bilaterally. The basilar artery is widely patent. There is a small left posterior communicating artery. There is unchanged occlusion of the right PCA near the P1-P2 junction. The left PCA is patent but diffusely irregular with a severe stenosis in the proximal P2 segment which appears increased from the prior CTA but less extensive than what  was suggested on the more recent MRA. No aneurysm is identified. Venous  sinuses: Hypoplastic and poorly visualized right transverse and sigmoid sinuses, unchanged. Other major dural venous sinuses are patent. Anatomic variants: Left A1 fenestration. Review of the MIP images confirms the above findings CT Brain Perfusion Findings: CBF (<30%) Volume: 0 mL Perfusion (Tmax>6.0s) volume: 45 mL Mismatch Volume: 45 mL Infarction Location: No acute core infarct is identified by automated processing. The region of delayed perfusion corresponds to the subacute right PCA infarct. IMPRESSION: CT HEAD: 1. Known acute left basal ganglia infarct and multiple subacute posterior circulation infarcts as shown on yesterday's MRI. 2. No definite new infarct or intracranial hemorrhage. 3. Chronic ischemia with multiple old infarcts as above. CTA HEAD AND NECK: 1. No emergent large vessel occlusion. 2. Unchanged right PCA occlusion. 3. Advanced intracranial atherosclerosis including severe proximal left PCA and left A3 stenoses which appear increased from the prior CTA. 4. Widely patent cervical carotid arteries. 5. Thyroid nodules measuring up to 1.5 cm. Recommend thyroid US (ref: J Am Coll Radiol. 2015 Feb;12(2): 143-50). CT PERFUSION: No evidence of acute core infarct or penumbra identified by automated processing. Electronically Signed   By: Logan Bores M.D.   On: 09/16/2020 10:32    Labs:  Basic Metabolic Panel: No results for input(s): NA, K, CL, CO2, GLUCOSE, BUN, CREATININE, CALCIUM, MG, PHOS in the last 168 hours.  CBC: No results for input(s): WBC, NEUTROABS, HGB, HCT, MCV, PLT in the last 168 hours.  CBG: Recent Labs  Lab 10/12/20 0603 10/12/20 1217 10/12/20 1628 10/12/20 2127 10/13/20 0550  GLUCAP 113* 110* 108* 103* 116*   Family history.  Mother with CVA Father with CAD Brother with CVA.  Denies any colon cancer rectal cancer or esophageal cancer  Brief HPI:   Craig Knight is a 55 y.o. male with history of hypertension, obesity, COPD with tobacco abuse diabetes mellitus  diastolic congestive heart failure CKD stage III with recent admission 09/06/2020 to 09/08/2020 for right PCA territory infarction with residual mild right side weakness.  Maintained on aspirin as well as Brilinta x30 days then aspirin 81 mg daily and Plavix 75 mg daily.  He was discharged to home minimal guard.  Patient lives with spouse and daughter.  Presented 09/15/2020 to [facility] nonspecific shortness of breath worsening right side weakness.  MRI of the brain showed acute left basal ganglia infarction.  Evolving subacute posterior circulation infarct without extension from 09/07/2020.  Moderate chronic small vessel ischemic changes.  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  There appeared to possibly have progressed since prior studies however difficult to compare given the amount of motion on the MRA.  CTA head neck no emergent large vessel occlusion.  Incidental findings of thyroid nodule follow-up outpatient.  Echocardiogram with ejection fraction of 60 to 65% no wall motion abnormalities.  TEE without PFO.  Admission chemistries unremarkable except BUN 33 creatinine 2.01 hemoglobin A1c 7.4.  Maintained on aspirin 81 mg daily and Brilinta 90 mg twice daily for CVA prophylaxis x30 days and back to aspirin and Plavix.  Subcutaneous Lovenox for DVT prophylaxis.  Refused loop recorder placement and Zio patch added.  Therapy evaluations completed and patient was admitted for a comprehensive rehab program.   Hospital Course: Craig Knight was admitted to rehab 09/19/2020 for inpatient therapies to consist of PT, ST and OT at least three hours five days a week. Past admission physiatrist, therapy team and rehab  RN have worked together to provide customized collaborative inpatient rehab.  Pertaining to patient's left corona radiata infarction with evolving subacute posterior circulation infarct he currently remained on aspirin and Brilinta for 30 days then  back to aspirin and Plavix DAPT at the recommendation of neurology services.  He had refused loop recorder a Zio patch was placed.  Mood stabilization trazodone at nighttime as well as low-dose Celexa.  Emotional support provided.  Blood sugars controlled hemoglobin A1c 7.4 with full diabetic teaching.  Lovenox for DVT prophylaxis no bleeding episodes.  Lipitor ongoing for hyperlipidemia.  He exhibited no other signs of fluid overload and blood pressure remained monitor on current regimen and will need outpatient follow-up..   Blood pressures were monitored on TID basis and soft and monitored  Diabetes has been monitored with ac/hs CBG checks and SSI was use prn for tighter BS control.    Rehab course: During patient's stay in rehab weekly team conferences were held to monitor patient's progress, set goals and discuss barriers to discharge. At admission, patient required mod max assist  Physical exam.  Blood pressure 151/103 pulse 62 temperature 95 respirations 18 Constitutional.  No acute distress HEENT Head.  Mild facial droop.  Tongue midline Eyes.  Pupils round and reactive to light no discharge without nystagmus Neck.  Supple nontender no JVD without thyromegaly Cardiac regular rate rhythm any extra sounds or murmur heard Abdomen.  Soft nontender positive bowel sounds without rebound Respiratory effort normal no respiratory distress without wheeze Skin.  Warm and dry Neurologic.  Alert oriented moderate dysarthria follows commands Motor.  Right upper right lower extremity 0/5 in all muscles tested deltoid bicep tricep wrist extension grip finger abduction as well as hip flexor knee extension knee flexion dorsi flexion plantar flexion Left upper left extremity 5/5 in same muscles  He/She  has had improvement in activity tolerance, balance, postural control as well as ability to compensate for deficits. He/She has had improvement in functional use RUE/LUE  and RLE/LLE as well as  improvement in awareness.  Right squat pivot edge of bed wheelchair with light min assist for balance.  Ambulates 20 feet rolling walker minimal assist.  He can increase his ambulation up to 50 feet monitoring of balance.  Moderate assist for functional ability to shower from edge of bed without assistive device.  Moderate assist in stance phase to help maintain left knee control and balance.  Patient was able to recall 3/3 speech intelligibility strategies required minimal assist verbal reminders and verbal encouragement for safety.  Full family teaching completed plan discharged home       Disposition: Discharged to home    Diet: Diabetic diet  Special Instructions: No driving smoking or alcohol  Medications at discharge. 1.  Tylenol as needed 2.  Xanax 0.25 mg 3 times daily as needed 3.  Aspirin 81 mg p.o. daily 4.  Lipitor 80 mg p.o. daily 5.  Hygroton 25 mg p.o. daily 6.  Celexa 10 mg p.o. nightly 7.  Plavix 75 mg p.o. daily beginning 10/14/2020 8.  Neurontin 300 mg p.o. nightly 9.  Zanaflex 2 mg p.o. 3 times daily 10.  Trazodone 50 mg p.o. nightly 11.  Ellipta inhaler 62.5-25 1 puff daily 12.  Brilinta 90 mg twice daily until 10/13/2020  30-35 minutes were spent completing discharge summary and discharge planning  Discharge Instructions     Ambulatory referral to Neurology   Complete by: As directed    An appointment is requested in approximately: 4  weeks left basal ganglia infarction   Ambulatory referral to Physical Medicine Rehab   Complete by: As directed    Moderate complexity follow-up 1 to 2 weeks left CR infarction        Follow-up Information     Evans Lance, MD Follow up.   Specialty: Cardiology Why: 11/17/20 @ 10:15AM follow up on heart monitor results Contact information: [place] [address] [phone]         Kirsteins, Luanna Salk, MD Follow up.   Specialty: Physical Medicine and Rehabilitation Why: Office to call for  appointment Contact information: [place] [address] [phone]         Rosita Fire, MD Follow up.   Specialty: Internal Medicine Contact information: [place] [address] [phone]         Pixie Casino, MD .   Specialty: Cardiology Contact information: [address] [place] [address] [phone]                 Signed: Lavon Paganini Reynolds 10/13/2020, 6:12 AM

## 2020-10-12 ENCOUNTER — Inpatient Hospital Stay: Payer: BLUE CROSS/BLUE SHIELD | Admitting: Adult Health

## 2020-10-12 ENCOUNTER — Ambulatory Visit: Payer: Medicare Other | Admitting: Physical Therapy

## 2020-10-12 ENCOUNTER — Encounter: Payer: Medicare Other | Admitting: Occupational Therapy

## 2020-10-12 LAB — GLUCOSE, CAPILLARY
Glucose-Capillary: 113 mg/dL — ABNORMAL HIGH (ref 70–99)
Glucose-Capillary: 110 mg/dL — ABNORMAL HIGH (ref 70–99)
Glucose-Capillary: 108 mg/dL — ABNORMAL HIGH (ref 70–99)
Glucose-Capillary: 103 mg/dL — ABNORMAL HIGH (ref 70–99)

## 2020-10-12 MED ORDER — CLOPIDOGREL BISULFATE 75 MG PO TABS: 75.0000 mg | ORAL_TABLET | Freq: Every day | ORAL | 0 refills | Status: AC

## 2020-10-12 MED ORDER — ACETAMINOPHEN 325 MG PO TABS: 650.0000 mg | ORAL_TABLET | ORAL | Status: AC | PRN

## 2020-10-12 MED ORDER — ALPRAZOLAM 0.25 MG PO TABS: 0.2500 mg | ORAL_TABLET | Freq: Three times a day (TID) | ORAL | 0 refills | Status: DC | PRN

## 2020-10-12 MED ORDER — ATORVASTATIN CALCIUM 80 MG PO TABS: 80.0000 mg | ORAL_TABLET | Freq: Every day | ORAL | 1 refills | Status: DC

## 2020-10-12 MED ORDER — NITROGLYCERIN 0.4 MG SL SUBL: 0.4000 mg | SUBLINGUAL_TABLET | SUBLINGUAL | 0 refills | Status: AC | PRN

## 2020-10-12 MED ORDER — CHLORTHALIDONE 25 MG PO TABS: 25.0000 mg | ORAL_TABLET | Freq: Every day | ORAL | 0 refills | Status: DC

## 2020-10-12 MED ORDER — TRAZODONE HCL 50 MG PO TABS: 50.0000 mg | ORAL_TABLET | Freq: Every day | ORAL | 0 refills | Status: DC

## 2020-10-12 MED ORDER — TIZANIDINE HCL 2 MG PO TABS: 2.0000 mg | ORAL_TABLET | Freq: Three times a day (TID) | ORAL | 0 refills | Status: AC

## 2020-10-12 MED ORDER — ALBUTEROL SULFATE HFA 108 (90 BASE) MCG/ACT IN AERS: 2.0000 | INHALATION_SPRAY | Freq: Four times a day (QID) | RESPIRATORY_TRACT | 8 refills | Status: AC | PRN

## 2020-10-12 MED ORDER — GABAPENTIN 300 MG PO CAPS: 300.0000 mg | ORAL_CAPSULE | Freq: Every day | ORAL | 0 refills | Status: DC

## 2020-10-12 MED ORDER — TRELEGY ELLIPTA 100-62.5-25 MCG/INH IN AEPB: 1.0000 | INHALATION_SPRAY | Freq: Every day | RESPIRATORY_TRACT | 8 refills | Status: AC

## 2020-10-12 MED ORDER — CITALOPRAM HYDROBROMIDE 10 MG PO TABS
10.0000 mg | ORAL_TABLET | Freq: Every day | ORAL | 0 refills | Status: DC
Start: 2020-10-12 — End: 2020-12-01

## 2020-10-12 NOTE — Progress Notes (Signed)
Occupational Therapy Discharge Summary  Patient Details  Name: Craig Knight MRN: [id number] Date of Birth: Sep 10, 1965  Today's Date: 10/12/2020 OT Individual Time: 1300-1351 OT Individual Time Calculation (min): 51 min   Session Note:  Session focused on continued family education with pt and spouse.  Had her practice sit to stand with use of the RW with hand splint on the left side in order to make it easier to complete toileting tasks.  She was able to successfully complete sit to stand with overall min assist.  Discussed with PT the need for walker currently, just for standing and it will be ordered.  Next, her complete squat pivot transfers from wheelchair to bed, which she was able to complete with overall min assist following [place] over the back technique.  Next, educated her on assistance with PROM/AAROM exercises for the RUE and demonstrated appropriate completion.  Handout was provided with reference code to look at videos if needed as she was not able to return demonstrate exercises.  Finished session with education on donning and doffing the right resting hand splint for night time wear.  Finished session with call button and phone in reach and safety belt in place.    Patient has met 11 of 13 long term goals due to improved activity tolerance, improved balance, postural control, ability to compensate for deficits, functional use of  RIGHT upper extremity, improved attention, and improved awareness.  Patient to discharge at [facility] Assist level.  Patient's care partner is independent to provide the necessary physical and cognitive assistance at discharge.    Reasons goals not met: He needs mod assist for use of the RUE as a stabilizer with mod assist for tub transfers.  Min assist is needed for memory and carryover from session to session.  Recommendation:  Patient will benefit from ongoing skilled OT services in outpatient setting to continue to advance functional skills in the  area of BADL and Reduce care partner burden.  Pt will continue to benefit from continue OT at outpatient setting to increased RUE function as well as increasing overall independence with basic selfcare tasks.    Equipment: Drop arm commode  Reasons for discharge: treatment goals met and discharge from hospital  Patient/family agrees with progress made and goals achieved: Yes  OT Discharge Precautions/Restrictions  Precautions Precautions: Fall;Other (comment) Precaution Comments: L hemianopsia; dense R hemiparesis Restrictions Weight Bearing Restrictions: No   Pain  No report of pain during OT ADL ADL Eating: Modified independent Where Assessed-Eating: Wheelchair Grooming: Modified independent Where Assessed-Grooming: Wheelchair Upper Body Bathing: Minimal assistance Where Assessed-Upper Body Bathing: Multimedia programmer, Chair Lower Body Bathing: Minimal assistance Where Assessed-Lower Body BathingChief Strategy Officer, Chair Upper Body Dressing: Minimal assistance Where Assessed-Upper Body Dressing: Wheelchair Lower Body Dressing: Moderate assistance Where Assessed-Lower Body Dressing: Wheelchair Toileting: Minimal assistance Where Assessed-Toileting: Bedside Commode Toilet Transfer: Minimal assistance Toilet Transfer Method: Stand pivot Science writer: Radiographer, therapeutic: Moderate assistance Tub/Shower Transfer Method: Stand pivot Tub/Shower Equipment: Facilities manager: Environmental education officer Method: Radiographer, therapeutic: Gaffer Baseline Vision/History: No visual deficits Patient Visual Report: Blurring of vision Vision Assessment?: Yes Eye Alignment: Within Functional Limits Alignment/Gaze Preference: Within Defined Limits Tracking/Visual Pursuits: Able to track stimulus in all quads without difficulty Visual Fields: Left homonymous hemianopsia Perception  Perception: Within  Functional Limits Praxis Praxis: Intact Cognition Overall Cognitive Status: Impaired/Different from baseline Arousal/Alertness: Awake/alert Attention: Focused;Sustained Focused Attention: Appears intact Sustained Attention: Impaired  Memory: Impaired Memory Impairment: Decreased short term memory Decreased Short Term Memory: Functional basic;Verbal basic Awareness: Impaired Awareness Impairment: Emergent impairment;Anticipatory impairment Problem Solving: Impaired Problem Solving Impairment: Functional basic;Functional complex Executive Function: Self Monitoring Self Monitoring: Impaired Behaviors: Poor frustration tolerance Safety/Judgment: Impaired Sensation Sensation Light Touch: Appears Intact Proprioception: Appears Intact Stereognosis: Not tested Additional Comments: Light touch and proprioception intact in the BUEs. Coordination Gross Motor Movements are Fluid and Coordinated: No Fine Motor Movements are Fluid and Coordinated: No Coordination and Movement Description: Pt with Brunnstrum stage II movements in the right arm with stage I in the hand.  He needs max hand over hand assistance to incorporate into functional use. Motor  Motor Motor: Hemiplegia;Abnormal postural alignment and control;Abnormal tone Motor - Discharge Observations: Still with moderate RUE and RLE hemiparesis Mobility  Bed Mobility Bed Mobility: Sit to Supine;Supine to Sit Supine to Sit: Minimal Assistance - Patient > 75% Sit to Supine: Minimal Assistance - Patient > 75% Transfers Sit to Stand: Minimal Assistance - Patient > 75% Stand to Sit: Minimal Assistance - Patient > 75%  Trunk/Postural Assessment  Cervical Assessment Cervical Assessment: Exceptions to [facility] (forward head) Thoracic Assessment Thoracic Assessment: Exceptions to [facility] (thoracic rounding) Lumbar Assessment Lumbar Assessment: Exceptions to [facility] (posterior pelvic tilt)  Balance Balance Balance Assessed: Yes Static Sitting  Balance Static Sitting - Balance Support: Feet supported Static Sitting - Level of Assistance: 5: Stand by assistance Dynamic Sitting Balance Dynamic Sitting - Balance Support: During functional activity Dynamic Sitting - Level of Assistance: 5: Stand by assistance Static Standing Balance Static Standing - Balance Support: During functional activity Static Standing - Level of Assistance: 4: Min assist Dynamic Standing Balance Dynamic Standing - Balance Support: During functional activity Dynamic Standing - Level of Assistance: 2: Max assist Extremity/Trunk Assessment RUE Assessment RUE Assessment: Exceptions to [facility] Passive Range of Motion (PROM) Comments: shoulder flexion 0-110 degrees with report of increased pain, so did not range further General Strength Comments: Pt currently Brunnstrum stage II in the arm and stage I in the hand with max hand over hand assist needed to integrate into self care tasks. LUE Assessment LUE Assessment: Within Functional Limits   Nekisha Mcdiarmid OTR/L 10/12/2020, 5:01 PM

## 2020-10-12 NOTE — Plan of Care (Signed)
  Problem: Sit to Stand Goal: LTG:  Patient will perform sit to stand with assistance level (PT) Description: LTG:  Patient will perform sit to stand with assistance level (PT) Outcome: Not Met (add Reason)   Problem: RH Balance Goal: LTG Patient will maintain dynamic sitting balance (PT) Description: LTG:  Patient will maintain dynamic sitting balance with assistance during mobility activities (PT) Outcome: Completed/Met Goal: LTG Patient will maintain dynamic standing balance (PT) Description: LTG:  Patient will maintain dynamic standing balance with assistance during mobility activities (PT) Outcome: Completed/Met   Problem: RH Bed Mobility Goal: LTG Patient will perform bed mobility with assist (PT) Description: LTG: Patient will perform bed mobility with assistance, with/without cues (PT). Outcome: Completed/Met   Problem: RH Bed to Chair Transfers Goal: LTG Patient will perform bed/chair transfers w/assist (PT) Description: LTG: Patient will perform bed to chair transfers with assistance (PT). Outcome: Completed/Met   Problem: RH Car Transfers Goal: LTG Patient will perform car transfers with assist (PT) Description: LTG: Patient will perform car transfers with assistance (PT). Outcome: Completed/Met   Problem: RH Ambulation Goal: LTG Patient will ambulate in controlled environment (PT) Description: LTG: Patient will ambulate in a controlled environment, # of feet with assistance (PT). Outcome: Completed/Met   Problem: RH Wheelchair Mobility Goal: LTG Patient will propel w/c in controlled environment (PT) Description: LTG: Patient will propel wheelchair in controlled environment, # of feet with assist (PT) Outcome: Completed/Met Goal: LTG Patient will propel w/c in home environment (PT) Description: LTG: Patient will propel wheelchair in home environment, # of feet with assistance (PT). Outcome: Completed/Met

## 2020-10-12 NOTE — Progress Notes (Signed)
Patient ID: CURLEY FALLO, male   DOB: 1966/01/18, 55 y.o.   MRN: [id number]  SW ordered RW per therapy request pt reports he not able to use the one he thought he had.   Loralee Pacas, MSW, [place] Office: [phone] Cell: [phone] Fax: [phone]

## 2020-10-12 NOTE — Progress Notes (Signed)
PROGRESS NOTE   Subjective/Complaints:  Feels cold on RIght side , no other c/os  ROS: Denies CP, SOB, N/V/D  Objective:   No results found. No results for input(s): WBC, HGB, HCT, PLT in the last 72 hours. No results for input(s): NA, K, CL, CO2, GLUCOSE, BUN, CREATININE, CALCIUM in the last 72 hours.  Intake/Output Summary (Last 24 hours) at 10/12/2020 0814 Last data filed at 10/12/2020 0439 Gross per 24 hour  Intake 480 ml  Output 800 ml  Net -320 ml         Physical Exam: Vital Signs Blood pressure (!) 138/93, pulse 81, temperature 97.9 F (36.6 C), temperature source Oral, resp. rate 18, height '5\' 7"'$  (1.702 m), weight 89.3 kg, SpO2 97 %.  General: No acute distress Mood and affect are appropriate Heart: Regular rate and rhythm no rubs murmurs or extra sounds Lungs: Clear to auscultation, breathing unlabored, no rales or wheezes Abdomen: Positive bowel sounds, soft nontender to palpation, nondistended Extremities: No clubbing, cyanosis, or edema, normal pedal and post tib pulse on RIght side  Skin temp normal on RIght  Skin: No evidence of breakdown, no evidence of rash  Neuro: Alert Dysarthria, unchanged Motor: LUE/LE: 5/5 proximal distal RUE/RLE: 0/5 proximal distal, unchanged  Assessment/Plan: 1. Functional deficits which require 3+ hours per day of interdisciplinary therapy in a comprehensive inpatient rehab setting. Physiatrist is providing close team supervision and 24 hour management of active medical problems listed below. Physiatrist and rehab team continue to assess barriers to discharge/monitor patient progress toward functional and medical goals  Care Tool:  Bathing    Body parts bathed by patient: Chest, Abdomen, Right lower leg, Left lower leg, Right upper leg, Left upper leg, Front perineal area, Buttocks, Face   Body parts bathed by helper: Right arm, Left arm     Bathing assist Assist  Level: Minimal Assistance - Patient > 75%     Upper Body Dressing/Undressing Upper body dressing   What is the patient wearing?: Pull over shirt    Upper body assist Assist Level: Minimal Assistance - Patient > 75%    Lower Body Dressing/Undressing Lower body dressing      What is the patient wearing?: Pants, Underwear/pull up     Lower body assist Assist for lower body dressing: Moderate Assistance - Patient 50 - 74%     Toileting Toileting    Toileting assist Assist for toileting: Minimal Assistance - Patient > 75% (for use of the urinal) Assistive Device Comment: urinal   Transfers Chair/bed transfer  Transfers assist     Chair/bed transfer assist level: Minimal Assistance - Patient > 75%     Locomotion Ambulation   Ambulation assist   Ambulation activity did not occur: Safety/medical concerns  Assist level: Moderate Assistance - Patient 50 - 74% Assistive device: Walker-rolling Max distance: 21f   Walk 10 feet activity   Assist  Walk 10 feet activity did not occur: Safety/medical concerns  Assist level: Moderate Assistance - Patient - 50 - 74% Assistive device: Walker-rolling   Walk 50 feet activity   Assist Walk 50 feet with 2 turns activity did not occur:  (not turning yet)  Assist  level: 2 helpers (mod A of 1 and +2 min A) Assistive device: Hand held assist    Walk 150 feet activity   Assist Walk 150 feet activity did not occur: Safety/medical concerns         Walk 10 feet on uneven surface  activity   Assist Walk 10 feet on uneven surfaces activity did not occur: Safety/medical concerns         Wheelchair     Assist Will patient use wheelchair at discharge?:  (TBD) Type of Wheelchair: Manual    Wheelchair assist level: Supervision/Verbal cueing Max wheelchair distance: 54'    Wheelchair 50 feet with 2 turns activity    Assist        Assist Level: Supervision/Verbal cueing   Wheelchair 150 feet  activity     Assist      Assist Level: Dependent - Patient 0%   Blood pressure (!) 138/93, pulse 81, temperature 97.9 F (36.6 C), temperature source Oral, resp. rate 18, height '5\' 7"'$  (1.702 m), weight 89.3 kg, SpO2 97 %.    Assessment/plan: 1.  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 Refused loop recorder and ZIO patch added. No UE or LE movement so far on RIght side   Continue CIR PT, OT, SLP Plan D/C in am  2.  Impaired mobility -DVT/anticoagulation: continue Lovenox             -antiplatelet therapy: Aspirin 81 mg daily and Brilinta 90 mg twice daily for 30 days then back to aspirin and Plavix DAPT  3. Neuropathic pain: Continue Neurontin 300 mg nightly  4. Mood/reactive depression/insomnia: Continue Trazodone 100 mg nightly, having some adjustment issues , would like neuropsych to follow , some hangover effect with higher trazodone dose reduced to '50mg'$   Irritable at times , on celexa '10mg'$  per day, stable on 8/14          May be brighter on reduced trazodone  5. Neuropsych: This patient is ?fully capable of making decisions on his own behalf. 6. Skin/Wound Care: Routine skin checks 7. Fluids/Electrolytes/Nutrition: Routine in and outs with follow-up chemistries 8.  Diabetes mellitus and peripheral neuropathy.  Hemoglobin A1c 7.4.  SSI.  Patient on Glucotrol 5 mg daily, Glucophage 500 mg twice daily prior to admission.  Resume as needed CBG (last 3)  Recent Labs    10/11/20 1649 10/11/20 2111 10/12/20 0603  GLUCAP 194* 114* 113*    Controlled on 8/18 9.  CKD stage III.    At baseline 10.  Hyperlipidemia.  Lipitor 11.  COPD/tobacco abuse. Pt states he quite smoking 5 wks PTA  Continue inhalers as directed.occ wheezing after therapy , advised pt to request prn inhaler  12.  Diastolic congestive heart failure.  No signs of fluid  overload. 13.  Obesity.  BMI 35.25.  Dietary follow-up 14.  Incidental thyroid nodule measuring 1.5 cm.  Follow-up outpatient thyroid ultrasound 15.  Essential hypertension.  Currently on Hygroton 25 mg daily.  Patient also had been on hydralazine 50 mg twice daily, Imdur 60 mg daily Cozaar 100 mg daily prior to admission.     Vitals:   10/11/20 1932 10/12/20 0510  BP: 137/90 (!) 138/93  Pulse: 71 81  Resp: 18 18  Temp: 98.6 F (37 C) 97.9 F (36.6 C)  SpO2: 98% 97%  Some lability but adequate control  16. Had BM's 8/6 after sorbitol, last BM 8/7 repeat  sorbitol and increased senna S to BID  17.  Post stroke dysphagia  D3 thins, advance diet as tolerated   LOS: 23 days A FACE TO FACE EVALUATION WAS PERFORMED  Charlett Blake 10/12/2020, 8:14 AM

## 2020-10-12 NOTE — Progress Notes (Signed)
Physical Therapy Discharge Summary  Patient Details  Name: Craig Knight MRN: [id number] Date of Birth: 31-May-1965  Today's Date: 10/12/2020 PT Individual Time: 1108-1203 PT Individual Time Calculation (min): 55 min    Patient has met 8 of 9 long term goals due to improved activity tolerance, improved balance, improved postural control, increased strength, ability to compensate for deficits, functional use of  right upper extremity and right lower extremity, improved attention, and improved awareness.  Patient to discharge at a wheelchair level with supervision and requiring Min Assist assist for squat pivot transfers.   Patient's care partner attended hands-on education/training and is independent to provide the necessary physical and cognitive assistance at discharge.  Reasons goals not met: Pt continues to require min assist for balance during sit<>stand transfers.  Recommendation:  Patient will benefit from ongoing skilled PT services in outpatient setting to continue to advance safe functional mobility, address ongoing impairments in R LE NMR, dynamic standing balance, transfer training, gait training with LRAD, endurance/activity tolerance, and minimize fall risk.  Equipment: Loaner wheelchair through [employer] with plan for custom wheelchair decision to be made during OPPT, RW, R LE Thuasne Sprystep PLUS GRAFO, R UE hand orthotic for RW  Reasons for discharge: treatment goals met and discharge from hospital  Patient/family agrees with progress made and goals achieved: Yes  Skilled Therapeutic Interventions/Progress Updates:  Pt received sitting in loaner wheelchair (delivered this AM) with his wife, [name], present for additional hands-on education/training and pt agreeable to therapy session. Pt's wife performed all hands-on assistance during session with therapist only providing verbal/visual cuing. Pt's wife able to set-up wheelchair for transfer to EOB with mod verbal cuing (pt  initially instructed her to set-up wheelchair in incorrect positioning making an unsafe transfer) - reinforced how to set-up wheelchair for squat pivot transfers. Pt's wife demos ability to manage w/c parts including brakes, armrests, footrests, and folding up w/c; all with only min cuing to feel/hear brakes "snap" into locked position. Pt's wife assisted pt with R squat pivot transfer w/c>EOB with min assist - she demonstrates proper positioning and use of gait belt during transfer - therapist educated them on ensuring pt scoots forward towards edge of w/c prior to initiating transfer and educates on head/hips relationship. Completed transfer back to w/c as just described. L hemi-technique w/c propulsion ~149ft to ADL apartment with supervision primarily using LE - demos increased ease with forward movement of w/c due to lightweight chair resulting in pt's ability to propel longer distance without increased assistance. Pt/wife set-up w/c and performed squat pivot transfer w/c<>couch with min cuing from therapist and pt's wife providing proper min assist - increased difficulty transferring off the couch due to low height, educated pt/wife on ensuring height of seats similar to w/c height. Educated pt/wife on stroke support group information. Pt's wife provided pt with dependent w/c mobility including ascending/descending ramp in preparation for home entry. Pt/wife report no additional questions/concerns and report feeling confident with D/C home tomorrow. At end of session pt left seated in w/c with needs in reach, chair alarm on, and pt's wife present.   PT Discharge Precautions/Restrictions Precautions Precautions: Fall;Other (comment) Precaution Comments: L hemianopsia; dense R hemiparesis Restrictions Weight Bearing Restrictions: No Pain Pain Assessment Pain Scale: 0-10 Pain Score: 0-No pain Perception  Vision - Assessment Eye Alignment: Within Functional Limits Alignment/Gaze Preference: Within  Defined Limits Tracking/Visual Pursuits: Able to track stimulus in all quads without difficulty Perception Perception: Within Functional Limits Praxis Praxis: Intact  Cognition Overall Cognitive  Status: Impaired/Different from baseline Arousal/Alertness: Awake/alert Orientation Level: Oriented X4 Attention: Focused;Sustained Focused Attention: Appears intact Sustained Attention: Impaired Memory: Impaired Awareness: Impaired Problem Solving: Impaired Executive Function: Self Monitoring Self Monitoring: Impaired Behaviors: Poor frustration tolerance Safety/Judgment: Impaired Sensation Sensation Light Touch: Appears Intact Hot/Cold: Not tested Proprioception: Impaired by gross assessment (noticed during transfers with poor R LE foot positioning awareness) Stereognosis: Not tested Additional Comments: Light touch and proprioception intact in the BUEs. Coordination Gross Motor Movements are Fluid and Coordinated: No Fine Motor Movements are Fluid and Coordinated: No Coordination and Movement Description: continues to have dense R hemiplegia (UE>LE) requiring max/total assist to manage R LE during functional mobility Heel Shin Test: unable to perform with R LE due to plegia Motor  Motor Motor: Hemiplegia;Abnormal postural alignment and control;Abnormal tone Motor - Discharge Observations: Still with moderate RUE and RLE hemiparesis  Mobility Bed Mobility Bed Mobility: Sit to Supine;Supine to Sit Supine to Sit: Minimal Assistance - Patient > 75% Sit to Supine: Minimal Assistance - Patient > 75% Transfers Transfers: Sit to Stand;Stand to Sit;Squat Pivot Transfers Sit to Stand: Minimal Assistance - Patient > 75% Stand to Sit: Minimal Assistance - Patient > 75% Squat Pivot Transfers: Minimal Assistance - Patient > 75% Transfer (Assistive device): Rolling walker (during sit<>stands) Locomotion  Gait Ambulation: Yes Gait Assistance: Moderate Assistance - Patient 50-74% Gait  Distance (Feet): 50 Feet Assistive device: Rolling walker;Other (Comment) (R hand orthotic on RW and R LE Thuasne Sprystep PLUS GRAFO) Gait Assistance Details: Tactile cues for sequencing;Tactile cues for weight shifting;Tactile cues for initiation;Tactile cues for posture;Verbal cues for sequencing;Verbal cues for gait pattern;Verbal cues for technique;Verbal cues for safe use of DME/AE;Manual facilitation for weight shifting;Manual facilitation for placement;Manual facilitation for weight bearing;Visual cues/gestures for sequencing;Tactile cues for weight beaing;Tactile cues for placement Gait Gait: Yes Gait Pattern: Impaired Gait Pattern: Step-to pattern;Step-through pattern;Decreased stance time - right;Decreased hip/knee flexion - right;Poor foot clearance - right;Decreased stride length;Decreased weight shift to right Gait velocity: significantly decreased Stairs / Additional Locomotion Stairs: Yes Stairs Assistance: 2 Helpers (+2 mod/max assist) Stair Management Technique: Two rails Number of Stairs: 4 Height of Stairs: 6 Wheelchair Mobility Wheelchair Mobility: Yes Wheelchair Assistance: Supervision/Verbal cueing Wheelchair Propulsion: Left lower extremity;Left upper extremity Wheelchair Parts Management: Needs assistance Distance: 155ft  Trunk/Postural Assessment  Cervical Assessment Cervical Assessment: Exceptions to [facility] (forward head) Thoracic Assessment Thoracic Assessment: Exceptions to [facility] (rounded shoulders) Lumbar Assessment Lumbar Assessment: Exceptions to [facility] (posterior pelvic tilt)  Balance Balance Balance Assessed: Yes Static Sitting Balance Static Sitting - Balance Support: Feet supported Static Sitting - Level of Assistance: 5: Stand by assistance Dynamic Sitting Balance Dynamic Sitting - Balance Support: During functional activity Dynamic Sitting - Level of Assistance: 5: Stand by assistance Static Standing Balance Static Standing - Balance Support: During  functional activity;Bilateral upper extremity supported Static Standing - Level of Assistance: 4: Min assist Dynamic Standing Balance Dynamic Standing - Balance Support: During functional activity;Bilateral upper extremity supported Dynamic Standing - Level of Assistance: 3: Mod assist Extremity Assessment     RLE Assessment RLE Assessment: Exceptions to [facility] Passive Range of Motion (PROM) Comments: WFL General Strength Comments: below MMT are noted during functional mobility with pt having difficulty recruiting muscle activation in open chain RLE Strength Right Hip Flexion: 2-/5 Right Hip Extension: 2-/5 Right Knee Flexion: 1/5 Right Knee Extension: 2+/5 Right Ankle Dorsiflexion: 0/5 Right Ankle Plantar Flexion: 0/5 RLE Tone RLE Tone: Hypotonic;Mild LLE Assessment LLE Assessment: Within Functional Limits Active Range of Motion (AROM)  Comments: [facility] General Strength Comments: grossly 5/5 assessd functionally    Tawana Scale , PT, DPT, NCS, CSRS 10/12/2020, 8:07 AM

## 2020-10-12 NOTE — Progress Notes (Signed)
Occupational Therapy Session Note  Patient Details  Name: Craig Knight MRN: [id number] Date of Birth: November 11, 1965  Today's Date: 10/12/2020 OT Individual Time: 1006-1101 OT Individual Time Calculation (min): 55 min    Short Term Goals: Week 3:  OT Short Term Goal 1 (Week 3): Continue working on established LTGs set at min assist overall.  Skilled Therapeutic Interventions/Progress Updates:    Pt sitting on drop arm commode to start session with NT present.  Noted he was not on the toilet completely and was wearing only regular socks with no gripper socks or shoes.  Took over for NT to assist pt with toileting.  Secondary to urgency, had him complete sit to stand in regular socks with min assist.  He was able to urinate but unable to successfully have a BM.  He was able to then work on donning his shoes with right AFO.  Max assist to donn them and tie them with min assist again for sit to stand to pull up his pants.  Remainder of session focused on family education with his spouse, having her assist with sit to stand, stand pivot transfers to the right, and squat pivot transfers to the left.  Noted initial difficulty with sit to stand for simulated toileting with spouse in front secondary to her restricting his ability to flex his trunk forward for standing.  Continued practice and she was able to help him complete with min assist.  Next, had her assist with squat pivot transfer to the weaker side with mod assist and then to the non-involved side with min assist squat/stand pivot.  Pt continues to try and stand to transfer to the right instead of maintaining squat position, which is much safer secondary to not being able to advance the RLE.  Progressed to having her complete Bobath method transfer over the back with squat pivot transfer to the right and she was able to complete with min assist.  Finished session with sit to stand at the sink with spouse beside of him for simulated LB dressing.  Pt left  in the wheelchair in preparation for next session with PT.  Will continue practice in afternoon session.    Therapy Documentation Precautions:  Precautions Precautions: Fall, Other (comment) Precaution Comments: L hemianopsia; dense R hemiparesis Restrictions Weight Bearing Restrictions: No  Pain:   No report of pain during session ADL: See Care Tool Section for some details of mobility and selfcare   Therapy/Group: Individual Therapy  Shandria Clinch OTR/L 10/12/2020, 4:36 PM

## 2020-10-12 NOTE — Progress Notes (Signed)
Physical Therapy Session Note  Patient Details  Name: Craig Knight MRN: [id number] Date of Birth: 02/11/1966  Today's Date: 10/12/2020 PT Individual Time: 1354-1426 PT Individual Time Calculation (min): 32 min   Short Term Goals: Week 3:  PT Short Term Goal 1 (Week 3): = to LTGs based on ELOS  Skilled Therapeutic Interventions/Progress Updates:    Pt received sitting in loaner wheelchair with his wife, [name], present and pt agreeable to therapy session. Pt's R shoe with toecap was delivered to pt's room earlier today. Instructed pt's wife on how to don AFO and shoe - reinforced earlier education on AFO wear and performing skin assessments. Pt/wife report no questions/concerns regarding earlier education/training and states no additional hands-on practice needed at this time. Pt reports L w/c footrest scrapping the ground.  Transported to/from gym in w/c for time management and energy conservation. Adjusted L LE w/c leg rest for improved floor clearance. Sit>stand w/c>RW with CGA - pt able to place R hand on/off RW orthotic without assist. Gait training ~70f using RW with mod assist for balance and AD management as well as max assist to advance R LE during swing phase (pt able to get it ~50% of the way <50% of the time) - continues to demo lack of full R hip/knee extension during stance with cuing for improvement. At end of session pt remained seated in w/c with needs in reach and chair alarm on. Pt/wife report feeling confident in D/C plan and have no additional questions/concerns at this time.  Therapy Documentation Precautions:  Precautions Precautions: Fall, Other (comment) Precaution Comments: L hemianopsia; dense R hemiparesis Restrictions Weight Bearing Restrictions: No   Pain:  No reports of pain throughout session.    Therapy/Group: Individual Therapy  CTawana Scale, PT, DPT, NCS, CSRS 10/12/2020, 8:09 AM

## 2020-10-13 LAB — GLUCOSE, CAPILLARY: Glucose-Capillary: 116 mg/dL — ABNORMAL HIGH (ref 70–99)

## 2020-10-13 NOTE — Progress Notes (Signed)
PROGRESS NOTE   Subjective/Complaints:    ROS: Denies CP, SOB, N/V/D  Objective:   No results found. No results for input(s): WBC, HGB, HCT, PLT in the last 72 hours. No results for input(s): NA, K, CL, CO2, GLUCOSE, BUN, CREATININE, CALCIUM in the last 72 hours.  Intake/Output Summary (Last 24 hours) at 10/13/2020 0717 Last data filed at 10/12/2020 2016 Gross per 24 hour  Intake 480 ml  Output 250 ml  Net 230 ml         Physical Exam: Vital Signs Blood pressure (!) 136/91, pulse 78, temperature 99.7 F (37.6 C), temperature source Oral, resp. rate 16, height '5\' 7"'$  (1.702 m), weight 89.3 kg, SpO2 96 %.  General: No acute distress Mood and affect are appropriate Heart: Regular rate and rhythm no rubs murmurs or extra sounds Lungs: Clear to auscultation, breathing unlabored, no rales or wheezes Abdomen: Positive bowel sounds, soft nontender to palpation, nondistended Extremities: No clubbing, cyanosis, or edema Skin: No evidence of breakdown, no evidence of rash    Neuro: Alert Dysarthria, unchanged Motor: LUE/LE: 5/5 proximal distal RUE/RLE: 0/5 proximal distal, unchanged  Assessment/Plan: 1. Functional deficits Stable for D/C today F/u PCP in 3-4 weeks F/u PM&R 2 weeks See D/C summary See D/C instructions  Care Tool:  Bathing    Body parts bathed by patient: Chest, Abdomen, Right lower leg, Left lower leg, Right upper leg, Left upper leg, Front perineal area, Buttocks, Face   Body parts bathed by helper: Right arm, Left arm     Bathing assist Assist Level: Minimal Assistance - Patient > 75%     Upper Body Dressing/Undressing Upper body dressing   What is the patient wearing?: Pull over shirt    Upper body assist Assist Level: Minimal Assistance - Patient > 75%    Lower Body Dressing/Undressing Lower body dressing      What is the patient wearing?: Pants, Underwear/pull up     Lower  body assist Assist for lower body dressing: Moderate Assistance - Patient 50 - 74%     Toileting Toileting    Toileting assist Assist for toileting: Minimal Assistance - Patient > 75% Assistive Device Comment: urinal   Transfers Chair/bed transfer  Transfers assist     Chair/bed transfer assist level: Minimal Assistance - Patient > 75% (squat pivot)     Locomotion Ambulation   Ambulation assist   Ambulation activity did not occur: Safety/medical concerns  Assist level: Moderate Assistance - Patient 50 - 74% Assistive device: Walker-rolling Max distance: 75f   Walk 10 feet activity   Assist  Walk 10 feet activity did not occur: Safety/medical concerns  Assist level: Moderate Assistance - Patient - 50 - 74% Assistive device: Walker-rolling   Walk 50 feet activity   Assist Walk 50 feet with 2 turns activity did not occur:  (not turning yet)  Assist level: Moderate Assistance - Patient - 50 - 74% Assistive device: Walker-rolling    Walk 150 feet activity   Assist Walk 150 feet activity did not occur: Safety/medical concerns         Walk 10 feet on uneven surface  activity   Assist Walk 10 feet on  uneven surfaces activity did not occur: Safety/medical concerns         Wheelchair     Assist Will patient use wheelchair at discharge?: Yes Type of Wheelchair: Manual    Wheelchair assist level: Supervision/Verbal cueing, Set up assist Max wheelchair distance: 167f    Wheelchair 50 feet with 2 turns activity    Assist        Assist Level: Supervision/Verbal cueing   Wheelchair 150 feet activity     Assist      Assist Level: Supervision/Verbal cueing   Blood pressure (!) 136/91, pulse 78, temperature 99.7 F (37.6 C), temperature source Oral, resp. rate 16, height '5\' 7"'$  (1.702 m), weight 89.3 kg, SpO2 96 %.    Assessment/plan: 1.  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 Refused loop recorder and ZIO patch added. No UE or LE movement so far on RIght side   Continue CIR PT, OT, SLP Plan D/C today  2.  Impaired mobility -DVT/anticoagulation: continue Lovenox             -antiplatelet therapy: Aspirin 81 mg daily and Brilinta 90 mg twice daily for 30 days then back to aspirin and Plavix DAPT  3. Neuropathic pain: Continue Neurontin 300 mg nightly  4. Mood/reactive depression/insomnia: Continue Trazodone 100 mg nightly, having some adjustment issues , would like neuropsych to follow , some hangover effect with higher trazodone dose reduced to '50mg'$   Irritable at times , on celexa '10mg'$  per day, stable on 8/14          May be brighter on reduced trazodone  5. Neuropsych: This patient is ?fully capable of making decisions on his own behalf. 6. Skin/Wound Care: Routine skin checks 7. Fluids/Electrolytes/Nutrition: Routine in and outs with follow-up chemistries 8.  Diabetes mellitus and peripheral neuropathy.  Hemoglobin A1c 7.4.  SSI.  Patient on Glucotrol 5 mg daily, Glucophage 500 mg twice daily prior to admission.  Resume as needed CBG (last 3)  Recent Labs    10/12/20 1628 10/12/20 2127 10/13/20 0550  GLUCAP 108* 103* 116*    Controlled on 8/19 9.  CKD stage III.    At baseline 10.  Hyperlipidemia.  Lipitor 11.  COPD/tobacco abuse. Pt states he quite smoking 5 wks PTA  Continue inhalers as directed.occ wheezing after therapy , advised pt to request prn inhaler  12.  Diastolic congestive heart failure.  No signs of fluid overload. 13.  Obesity.  BMI 35.25.  Dietary follow-up 14.  Incidental thyroid nodule measuring 1.5 cm.  Follow-up outpatient thyroid ultrasound 15.  Essential hypertension.  Currently on Hygroton 25 mg daily.  Patient also had been on hydralazine 50 mg twice daily, Imdur 60 mg daily Cozaar 100 mg daily prior to admission.     Vitals:   10/12/20 2008  10/13/20 0456  BP: (!) 132/97 (!) 136/91  Pulse: 73 78  Resp: 18 16  Temp: 99.4 F (37.4 C) 99.7 F (37.6 C)  SpO2: 99% 96%  Good control  16. Had BM's 8/6 after sorbitol, last BM 8/7 repeat sorbitol and increased senna S to BID  17.  Post stroke dysphagia  D3 thins, advance diet as tolerated   LOS: 24 days A FACE TO FACE EVALUATION WAS PERFORMED  ACharlett Blake8/19/2022, 7:17 AM

## 2020-10-16 ENCOUNTER — Ambulatory Visit: Payer: Medicare Other

## 2020-10-16 ENCOUNTER — Ambulatory Visit: Payer: Medicare Other | Admitting: Adult Health

## 2020-10-16 ENCOUNTER — Other Ambulatory Visit: Payer: Self-pay

## 2020-10-16 ENCOUNTER — Encounter: Payer: Medicare Other | Admitting: Occupational Therapy

## 2020-10-16 ENCOUNTER — Encounter: Payer: Self-pay | Admitting: Adult Health

## 2020-10-16 VITALS — BP 116/82 | HR 67

## 2020-10-16 DIAGNOSIS — G4733 Obstructive sleep apnea (adult) (pediatric): Secondary | ICD-10-CM

## 2020-10-16 DIAGNOSIS — Z9989 Dependence on other enabling machines and devices: Secondary | ICD-10-CM

## 2020-10-16 DIAGNOSIS — G8191 Hemiplegia, unspecified affecting right dominant side: Secondary | ICD-10-CM

## 2020-10-16 DIAGNOSIS — E1165 Type 2 diabetes mellitus with hyperglycemia: Secondary | ICD-10-CM

## 2020-10-16 DIAGNOSIS — I639 Cerebral infarction, unspecified: Secondary | ICD-10-CM | POA: Diagnosis not present

## 2020-10-16 DIAGNOSIS — H53462 Homonymous bilateral field defects, left side: Secondary | ICD-10-CM

## 2020-10-16 DIAGNOSIS — I10 Essential (primary) hypertension: Secondary | ICD-10-CM | POA: Diagnosis not present

## 2020-10-16 DIAGNOSIS — E785 Hyperlipidemia, unspecified: Secondary | ICD-10-CM | POA: Diagnosis not present

## 2020-10-16 NOTE — Patient Instructions (Signed)
Start working with therapies at [employer] aspirin 81 mg daily and clopidogrel 75 mg daily  and atorvastatin '80mg'$  daily  for secondary stroke prevention  Continue to follow up with PCP regarding cholesterol and blood pressure management  Maintain strict control of hypertension with blood pressure goal below 130/90 and cholesterol with LDL cholesterol (bad cholesterol) goal below 70 mg/dL.       Followup in the future with me in 3 months or call earlier if needed       Thank you for coming to see [place] at [facility]. I hope we have been able to provide you high quality care today.  You may receive a patient satisfaction survey over the next few weeks. We would appreciate your feedback and comments so that we may continue to improve ourselves and the health of our patients.

## 2020-10-16 NOTE — Progress Notes (Signed)
[facility] [address] [place]. [place] [address] [phone]       HOSPITAL FOLLOW UP NOTE  [name] Date of Birth:  12-Jun-1965 Medical Record Number:  [id number]   Reason for Referral:  hospital stroke follow up    SUBJECTIVE:   CHIEF COMPLAINT:  Chief Complaint  Patient presents with   Follow-up    Rm 3 here for hsp f/u- Reports he has been doing ok. Left peripheral vision still affected ( would like a referral to an eye doc) working with out pt physical therapy     HPI:   [name] is a 55 y.o. male with history of DM2, COPD, ongoing tobacco use, HTN, OSA, recent large R PCA stroke and left pontine stroke 09/06/2020 with residual L hemianopsia and mild right sided weakness and right facial droop with unclear stroke etiology. TEE and loop recommended however due to weekend, plan was to f/u outpatient for completion.  Returns to [facility] ED on 09/15/2020 with shortness of breath and worsening R sided weakness.  Personally reviewed hospitalization pertinent progress notes, lab work and imaging.  Transferred to [facility] ED for further stroke evaluation.  Evaluated by Dr. Erlinda Hong for L CR stroke secondary to unclear source.  MRA head moderate to severe occlusion right PCA and severe stenosis left PCA. CTA head/neck unchanged left PCA occlusion and intracranial stenosis with severe proximal left PCA and left P3 stenosis.  CT perfusion no new perfusion deficit.  TEE normal EF and negative PFO.  Refused loop recorder placement therefore Zio patch recommended.  On aspirin and Brilinta PTA from recent stroke admission and recommended continuation for 30 days then resume aspirin and Plavix.  A1c 7.4.  LDL 67 on atorvastatin 80 mg daily.  Other stroke risk factors include recent tobacco use, EtOH use, obesity, prior strokes on aging, family history of stroke and OSA.  Therapy evaluations for residual right hemiplegia and left hemianopia -recommended CIR.  Today,  10/16/2020, Craig Knight is being seen for hospital follow-up accompanied by his wife.  He was recently discharged home from [facility] on 8/19. He plans on starting PT/OT/SLP at [facility] on 8/29.  Reports residual left visual loss and right sided weakness.  He is not ambulatory - able to stand/pivot for transfers into wheelchair.  Denies residual swallowing difficulties.  He is overly frustrated with continued deficits reporting "I want to be better right this minute!".  He is currently residing with his wife who assists with majority of ADLs and all IADLs.  Denies new stroke/TIA symptoms.  Completed 30 days of aspirin and Brilinta during CIR admission and remains on aspirin and Plavix as well as atorvastatin without side effects.  Blood pressure today 116/82.  Cardiac monitor completed prior to leaving rehab - results not yet available. Use of CPAP nightly for sleep apnea management.  No further concerns at this time.       Pertinent imaging  09/15/2020 admission CT head - Evolving right PCA infarct without interval hemorrhage. Evolving left pontine infarct without interval hemorrhage. No definite new acute intracranial abnormality since prior study from 09/06/2020. Multiple chronic infarcts involving left frontal lobe, bilateral white matter, basal ganglia and thalamus. MRI head 09/15/20 - Acute left CR infarct. Evolving subacute posterior circulation infarcts without extension from the 09/07/2020 MRI. Multiple old infarcts. MRI Head 09/17/20 - Unchanged acute and subacute infarcts involving the infra and supratentorial brain. MRA head - Moderate to severe Occlusion right PCA. Severe stenosis left PCA  with distal flow. CTA H&N - unchanged left PCA occlusion, underwent intracranial stenosis with severe proximal left PCA and left A3 stenosis CT Perfusion no new perfusion deficit TEE with normal EF and negative PFO  09/06/2020 admission CT subacute right PCA stroke CTA head and neck right P1/P2 occlusion,  bilateral M3 mild to moderate stenosis.  Right A3 moderate stenosis, left VA and left P2 moderate stenosis MRI right PCA large infarct with left pontine infarct 2D Echo EF 60 to 65%   ROS:   14 system review of systems performed and negative with exception of those listed in HPI  PMH:  Past Medical History:  Diagnosis Date   Asthma    Bronchitis    COPD (chronic obstructive pulmonary disease) ([place])    Diabetes mellitus without complication ([employer])    Heart murmur [id number]   soft systolic murmur 1/6   Hypertension    Sleep apnea    Stroke [facility])     PSH:  Past Surgical History:  Procedure Laterality Date   BUBBLE STUDY  09/18/2020   Procedure: BUBBLE STUDY;  Surgeon: Pixie Casino, MD;  Location: [facility];  Service: Cardiovascular;;   COLONOSCOPY N/A 09/18/2016   Procedure: COLONOSCOPY;  Surgeon: Danie Binder, MD;  [facility];  Service: Endoscopy;  Laterality: N/A;  10:30 AM   lipoma removal     TEE WITHOUT CARDIOVERSION N/A 09/18/2020   Procedure: TRANSESOPHAGEAL ECHOCARDIOGRAM (TEE);  Surgeon: Pixie Casino, MD;  Location: [facility] ENDOSCOPY;  Service: Cardiovascular;  Laterality: N/A;    Social History:  Social History   Socioeconomic History   Marital status: Married    Spouse name: Not on file   Number of children: Not on file   Years of education: Not on file   Highest education level: Not on file  Occupational History   Not on file  Tobacco Use   Smoking status: Every Day    Packs/day: 0.25    Types: Cigarettes   Smokeless tobacco: Never   Tobacco comments:    Quitting per patient  Vaping Use   Vaping Use: Never used  Substance and Sexual Activity   Alcohol use: Yes    Alcohol/week: 0.0 standard drinks    Comment: occasionally   Drug use: No   Sexual activity: Yes    Birth control/protection: None  Other Topics Concern   Not on file  Social History Narrative   Not on file   Social Determinants of Health   Financial Resource  Strain: Not on file  Food Insecurity: Not on file  Transportation Needs: Not on file  Physical Activity: Not on file  Stress: Not on file  Social Connections: Not on file  Intimate Partner Violence: Not on file    Family History:  Family History  Problem Relation Age of Onset   Stroke Mother    Heart attack Father 30       CABG   Hypertension Sister    Stroke Brother    Hypertension Brother    Stomach cancer Brother    Stroke Maternal Grandmother    Heart attack Maternal Grandfather    Heart attack Paternal Grandmother     Medications:   Current Outpatient Medications on File Prior to Visit  Medication Sig Dispense Refill   acetaminophen (TYLENOL) 325 MG tablet Take 2 tablets (650 mg total) by mouth every 4 (four) hours as needed for mild pain (or temp > 37.5 C (99.5 F)).     albuterol (VENTOLIN HFA)  108 (90 Base) MCG/ACT inhaler Inhale 2 puffs into the lungs every 6 (six) hours as needed for wheezing or shortness of breath. 18 g 8   ALPRAZolam (XANAX) 0.25 MG tablet Take 1 tablet (0.25 mg total) by mouth 3 (three) times daily as needed for anxiety. 30 tablet 0   aspirin EC 81 MG tablet Take 1 tablet (81 mg total) by mouth daily. 30 tablet 0   atorvastatin (LIPITOR) 80 MG tablet Take 1 tablet (80 mg total) by mouth daily. 90 tablet 1   chlorthalidone (HYGROTON) 25 MG tablet Take 1 tablet (25 mg total) by mouth daily. 30 tablet 0   citalopram (CELEXA) 10 MG tablet Take 1 tablet (10 mg total) by mouth at bedtime. 30 tablet 0   clopidogrel (PLAVIX) 75 MG tablet Take 1 tablet (75 mg total) by mouth daily. 30 tablet 0   Fluticasone-Umeclidin-Vilant (TRELEGY ELLIPTA) 100-62.5-25 MCG/INH AEPB Inhale 1 puff into the lungs daily. 60 each 8   gabapentin (NEURONTIN) 300 MG capsule Take 1 capsule (300 mg total) by mouth at bedtime. 30 capsule 0   nitroGLYCERIN (NITROSTAT) 0.4 MG SL tablet Place 1 tablet (0.4 mg total) under the tongue every 5 (five) minutes as needed for chest pain. 30  tablet 0   NON FORMULARY Pt uses cpap nightly     tiZANidine (ZANAFLEX) 2 MG tablet Take 1 tablet (2 mg total) by mouth 3 (three) times daily. 90 tablet 0   traZODone (DESYREL) 50 MG tablet Take 1 tablet (50 mg total) by mouth at bedtime. 30 tablet 0   No current facility-administered medications on file prior to visit.    Allergies:   Allergies  Allergen Reactions   Dust Mite Extract     Sneezing   Lisinopril Cough      OBJECTIVE:  Physical Exam  Vitals:   10/16/20 1421  BP: 116/82  Pulse: 67  SpO2: 94%   There is no height or weight on file to calculate BMI. No results found.  General: well developed, well nourished, pleasant middle-aged African-American male, seated, in no evident distress Head: head normocephalic and atraumatic.   Neck: supple with no carotid or supraclavicular bruits Cardiovascular: regular rate and rhythm, no murmurs Musculoskeletal: no deformity Skin:  no rash/petichiae Vascular:  Normal pulses all extremities   Neurologic Exam Mental Status: Awake and fully alert.  Mild dysarthria.  Oriented to place and time. Recent memory mildly impaired and remote memory intact. Attention span, concentration and fund of knowledge appropriate during visit. Mood and affect appropriate.  Cranial Nerves: Fundoscopic exam reveals sharp disc margins. Pupils equal, briskly reactive to light. Extraocular movements full without nystagmus. Visual fields dense left homonymous hemianopia. Hearing intact. Facial sensation intact.  Right lower facial weakness.  Tongue, and palate moves normally and symmetrically.   Motor: Normal strength, bulk and tone left upper and lower extremity.  Dense right hemiplegia with AFO brace in place.   Sensory.: intact to touch , pinprick , position and vibratory sensation.  Coordination: Rapid alternating movements normal on left side. Finger-to-nose and heel-to-shin performed accurately on left side. Gait and Station: deferred as  nonambulatory. Reflexes: 2+ RUE and RLE; 1+ LUE and LLE. Toes downgoing.     NIHSS  13 Modified [place]in  3-4      ASSESSMENT: NEDAL SABORI is a 55 y.o. year old male with R PCA large infarct and left pontine infarct on 09/06/2020 possibly with large/small vessel disease vs cardioembolic source with residual left hemianopia and left  CR stroke on 09/15/2020 of unknown etiology.  Vascular risk factors include HTN, HLD, DM, tobacco use, OSA, intracranial stenosis, CHF, CKD and EtOH use.      PLAN:  Recurrent strokes:  Residual deficit: Dense right hemiplegia, left homonymous hemianopia, dysarthria and cognitive impairment. Plan to start therapies on 8/29 in [place]. Discussed typical timeframe of stroke recovery. Will hold off on ophthalmology referral (as pt requested today) to allow for further recovery time Cardiac event monitor completed - currently awaiting results. If negative, would recommend proceeding with loop recorder placement Continue aspirin 81 mg daily and clopidogrel 75 mg daily  and atorvastatin 80 mg daily for secondary stroke prevention.   Discussed secondary stroke prevention measures and importance of close PCP follow up for aggressive stroke risk factor management. I have gone over the pathophysiology of stroke, warning signs and symptoms, risk factors and their management in some detail with instructions to go to the closest emergency room for symptoms of concern. HTN: BP goal <130/90.  Stable on current regimen per PCP HLD: LDL goal <70. Recent LDL 67 on atorvastatin 80 mg daily.  DMII: A1c goal<7.0. Recent A1c 7.4.  Routinely monitored at home. OSA on CPAP: Discussed importance of continue to use for further stroke prevention strategies    Follow up in 3 months or call earlier if needed   CC:  [facility] provider: Dr. Leonie Man PCP: Rosita Fire, MD    I spent 56 minutes of face-to-face and non-face-to-face time with patient and wife.  This included previsit chart  review including review of recent hospitalization, lab review, study review, electronic health record documentation, patient and wife education regarding recent strokes and potential etiologies, residual deficits and typical recovery time, secondary stroke prevention measures and importance of managing stroke risk factors and answered all other questions to patient satisfaction   Frann Rider, AGNP-BC  [facility] [address] [place], [place] [address]  Phone [phone] Fax [phone] Note: This document was prepared with digital dictation and possible smart phrase technology. Any transcriptional errors that result from this process are unintentional.

## 2020-10-17 ENCOUNTER — Other Ambulatory Visit: Payer: Self-pay

## 2020-10-17 ENCOUNTER — Encounter: Payer: Self-pay | Admitting: Internal Medicine

## 2020-10-17 ENCOUNTER — Telehealth: Payer: Self-pay | Admitting: *Deleted

## 2020-10-17 ENCOUNTER — Ambulatory Visit (INDEPENDENT_AMBULATORY_CARE_PROVIDER_SITE_OTHER): Payer: Medicare Other | Admitting: Internal Medicine

## 2020-10-17 VITALS — BP 116/78 | HR 70 | Ht 68.0 in | Wt 190.8 lb

## 2020-10-17 DIAGNOSIS — I639 Cerebral infarction, unspecified: Secondary | ICD-10-CM | POA: Diagnosis not present

## 2020-10-17 NOTE — Progress Notes (Signed)
I agree with the above plan 

## 2020-10-17 NOTE — Telephone Encounter (Signed)
Transition Care Management Unsuccessful Follow-up Telephone Call  Date of discharge and from where:  10/13/20  [facility] INPT REHAB  Attempts:  1st Attempt  Reason for unsuccessful TCM follow-up call:  Left voice message

## 2020-10-17 NOTE — Patient Instructions (Signed)
Medication Instructions:  Your physician recommends that you continue on your current medications as directed. Please refer to the Current Medication list given to you today.  *If you need a refill on your cardiac medications before your next appointment, please call your pharmacy*   Lab Work: NONE   If you have labs (blood work) drawn today and your tests are completely normal, you will receive your results only by: [place] (if you have MyChart) OR A paper copy in the mail If you have any lab test that is abnormal or we need to change your treatment, we will call you to review the results.   Testing/Procedures: NONE    Follow-Up: At [facility], you and your health needs are our priority.  As part of our continuing mission to provide you with exceptional heart care, we have created designated Provider Care Teams.  These Care Teams include your primary Cardiologist (physician) and Advanced Practice Providers (APPs -  Physician Assistants and Nurse Practitioners) who all work together to provide you with the care you need, when you need it.  We recommend signing up for the patient portal called "MyChart".  Sign up information is provided on this After Visit Summary.  MyChart is used to connect with patients for Virtual Visits (Telemedicine).  Patients are able to view lab/test results, encounter notes, upcoming appointments, etc.  Non-urgent messages can be sent to your provider as well.   To learn more about what you can do with MyChart, go to [email or web address].    Your next appointment:    At [facility] office   The format for your next appointment:   In Person  Provider:   Cristopher Peru, MD   Other Instructions Thank you for choosing [facility]!   Implantable Loop Recorder Placement, Care After This sheet gives you information about how to care for yourself after your procedure. Your health care provider may also give you more specific instructions.  If you have problems or questions, contact your health careprovider. What can I expect after the procedure? After the procedure, it is common to have: Soreness or discomfort near the incision. Some swelling or bruising near the incision. Follow these instructions at home: Incision care  Follow instructions from your health care provider about how to take care of your incision. Make sure you: Wash your hands with soap and water before you change your bandage (dressing). If soap and water are not available, use hand sanitizer. Change your dressing as told by your health care provider. Keep your dressing dry. Leave stitches (sutures), skin glue, or adhesive strips in place. These skin closures may need to stay in place for 2 weeks or longer. If adhesive strip edges start to loosen and curl up, you may trim the loose edges. Do not remove adhesive strips completely unless your health care provider tells you to do that. Check your incision area every day for signs of infection. Check for: Redness, swelling, or pain. Fluid or blood. Warmth. Pus or a bad smell. Do not take baths, swim, or use a hot tub until your health care provider approves. Ask your health care provider if you can take showers.  Activity  Return to your normal activities as told by your health care provider. Ask your health care provider what activities are safe for you. Do not drive for 24 hours if you were given a sedative during your procedure.  General instructions Follow instructions from your health care provider about how to manage your  implantable loop recorder and transmit the information. Learn how to activate a recording if this is necessary for your type of device. Do not go through a metal detection gate, and do not let someone hold a metal detector over your chest. Show your ID card. Do not have an MRI unless you check with your health care provider first. Take over-the-counter and prescription medicines only as  told by your health care provider. Keep all follow-up visits as told by your health care provider. This is important. Contact a health care provider if: You have redness, swelling, or pain around your incision. You have a fever. You have pain that is not relieved by your pain medicine. You have triggered your device because of fainting (syncope) or because of a heartbeat that feels like it is racing, slow, fluttering, or skipping (palpitations). Get help right away if you have: Chest pain. Difficulty breathing. Summary After the procedure, it is common to have soreness or discomfort near the incision. Change your dressing as told by your health care provider. Follow instructions from your health care provider about how to manage your implantable loop recorder and transmit the information. Keep all follow-up visits as told by your health care provider. This is important. This information is not intended to replace advice given to you by your health care provider. Make sure you discuss any questions you have with your healthcare provider. Document Revised: 03/29/2017 Document Reviewed: 03/29/2017 Elsevier Patient Education  2022 [employer].

## 2020-10-17 NOTE — Progress Notes (Signed)
HPI Craig Knight is referred today for evaluation of a cryptogenic stroke. He is a pleasant 55 yo man with a h/o stroke sustained a couple of months ago. He has residual right HP and an expressive aphasia. He has not had palpitations and there is no documented atrial fib.  Allergies  Allergen Reactions   Dust Mite Extract     Sneezing   Lisinopril Cough     Current Outpatient Medications  Medication Sig Dispense Refill   acetaminophen (TYLENOL) 325 MG tablet Take 2 tablets (650 mg total) by mouth every 4 (four) hours as needed for mild pain (or temp > 37.5 C (99.5 F)).     albuterol (VENTOLIN HFA) 108 (90 Base) MCG/ACT inhaler Inhale 2 puffs into the lungs every 6 (six) hours as needed for wheezing or shortness of breath. 18 g 8   ALPRAZolam (XANAX) 0.25 MG tablet Take 1 tablet (0.25 mg total) by mouth 3 (three) times daily as needed for anxiety. 30 tablet 0   aspirin EC 81 MG tablet Take 1 tablet (81 mg total) by mouth daily. 30 tablet 0   atorvastatin (LIPITOR) 80 MG tablet Take 1 tablet (80 mg total) by mouth daily. 90 tablet 1   chlorthalidone (HYGROTON) 25 MG tablet Take 1 tablet (25 mg total) by mouth daily. 30 tablet 0   citalopram (CELEXA) 10 MG tablet Take 1 tablet (10 mg total) by mouth at bedtime. 30 tablet 0   clopidogrel (PLAVIX) 75 MG tablet Take 1 tablet (75 mg total) by mouth daily. 30 tablet 0   Fluticasone-Umeclidin-Vilant (TRELEGY ELLIPTA) 100-62.5-25 MCG/INH AEPB Inhale 1 puff into the lungs daily. 60 each 8   gabapentin (NEURONTIN) 300 MG capsule Take 1 capsule (300 mg total) by mouth at bedtime. 30 capsule 0   nitroGLYCERIN (NITROSTAT) 0.4 MG SL tablet Place 1 tablet (0.4 mg total) under the tongue every 5 (five) minutes as needed for chest pain. 30 tablet 0   NON FORMULARY Pt uses cpap nightly     tiZANidine (ZANAFLEX) 2 MG tablet Take 1 tablet (2 mg total) by mouth 3 (three) times daily. 90 tablet 0   traZODone (DESYREL) 50 MG tablet Take 1 tablet (50 mg  total) by mouth at bedtime. 30 tablet 0   losartan (COZAAR) 100 MG tablet Take 100 mg by mouth daily. (Patient not taking: Reported on 10/17/2020)     No current facility-administered medications for this visit.     Past Medical History:  Diagnosis Date   Asthma    Bronchitis    COPD (chronic obstructive pulmonary disease) ([place])    Diabetes mellitus without complication ([place])    Heart murmur [id number]   soft systolic murmur 1/6   Hypertension    Sleep apnea    Stroke ([place])     ROS:   All systems reviewed and negative except as noted in the HPI.   Past Surgical History:  Procedure Laterality Date   BUBBLE STUDY  09/18/2020   Procedure: BUBBLE STUDY;  Surgeon: Pixie Casino, MD;  Location: [facility];  Service: Cardiovascular;;   COLONOSCOPY N/A 09/18/2016   Procedure: COLONOSCOPY;  Surgeon: Danie Binder, MD;  [facility];  Service: Endoscopy;  Laterality: N/A;  10:30 AM   lipoma removal     TEE WITHOUT CARDIOVERSION N/A 09/18/2020   Procedure: TRANSESOPHAGEAL ECHOCARDIOGRAM (TEE);  Surgeon: Pixie Casino, MD;  Location: [facility];  Service: Cardiovascular;  Laterality: N/A;     Family  History  Problem Relation Age of Onset   Stroke Mother    Heart attack Father 36       CABG   Hypertension Sister    Stroke Brother    Hypertension Brother    Stomach cancer Brother    Stroke Maternal Grandmother    Heart attack Maternal Grandfather    Heart attack Paternal Grandmother      Social History   Socioeconomic History   Marital status: Married    Spouse name: Not on file   Number of children: Not on file   Years of education: Not on file   Highest education level: Not on file  Occupational History   Not on file  Tobacco Use   Smoking status: Every Day    Packs/day: 0.25    Types: Cigarettes   Smokeless tobacco: Never   Tobacco comments:    Quitting per patient  Vaping Use   Vaping Use: Never used  Substance and Sexual Activity    Alcohol use: Yes    Alcohol/week: 0.0 standard drinks    Comment: occasionally   Drug use: No   Sexual activity: Yes    Birth control/protection: None  Other Topics Concern   Not on file  Social History Narrative   Not on file   Social Determinants of Health   Financial Resource Strain: Not on file  Food Insecurity: Not on file  Transportation Needs: Not on file  Physical Activity: Not on file  Stress: Not on file  Social Connections: Not on file  Intimate Partner Violence: Not on file     BP 116/78   Pulse 70   Ht '5\' 8"'$  (1.727 m)   Wt 190 lb 12.8 oz (86.5 kg)   SpO2 97%   BMI 29.01 kg/m   Physical Exam:  Well appearing NAD HEENT: Unremarkable Neck:  No JVD, no thyromegally Lymphatics:  No adenopathy Back:  No CVA tenderness Lungs:  Clear HEART:  Regular rate rhythm, no murmurs, no rubs, no clicks Abd:  soft, positive bowel sounds, no organomegally, no rebound, no guarding Ext:  2 plus pulses, no edema, no cyanosis, no clubbing Skin:  No rashes no nodules Neuro:  CN II through XII intact, motor grossly intact except for right hp and expressive aphasia.   DEVICE  Normal device function.  See PaceArt for details.   Assess/Plan:  Cryptogenic stroke - I have discussed the treatment options with the patient and recommended insertion of an ILR. We will schedule in [place] at our [address] office. 2. HTN - his bp is controlled today  Craig Knight

## 2020-10-19 ENCOUNTER — Ambulatory Visit: Payer: Medicare Other

## 2020-10-19 ENCOUNTER — Encounter: Payer: Medicare Other | Admitting: Occupational Therapy

## 2020-10-23 ENCOUNTER — Ambulatory Visit: Payer: Medicare Other

## 2020-10-23 ENCOUNTER — Ambulatory Visit: Payer: Medicare Other | Admitting: Pulmonary Disease

## 2020-10-23 ENCOUNTER — Other Ambulatory Visit: Payer: Self-pay

## 2020-10-23 ENCOUNTER — Ambulatory Visit (HOSPITAL_COMMUNITY): Payer: Medicare Other | Admitting: Physical Therapy

## 2020-10-23 ENCOUNTER — Encounter: Payer: Medicare Other | Admitting: Occupational Therapy

## 2020-10-23 ENCOUNTER — Encounter (HOSPITAL_COMMUNITY): Payer: Self-pay | Admitting: Physical Therapy

## 2020-10-23 ENCOUNTER — Ambulatory Visit (HOSPITAL_COMMUNITY): Payer: Medicare Other | Attending: Physical Medicine & Rehabilitation | Admitting: Speech Pathology

## 2020-10-23 ENCOUNTER — Encounter (HOSPITAL_COMMUNITY): Payer: Self-pay | Admitting: Speech Pathology

## 2020-10-23 DIAGNOSIS — R262 Difficulty in walking, not elsewhere classified: Secondary | ICD-10-CM

## 2020-10-23 DIAGNOSIS — R278 Other lack of coordination: Secondary | ICD-10-CM | POA: Insufficient documentation

## 2020-10-23 DIAGNOSIS — M6281 Muscle weakness (generalized): Secondary | ICD-10-CM | POA: Insufficient documentation

## 2020-10-23 DIAGNOSIS — R41841 Cognitive communication deficit: Secondary | ICD-10-CM | POA: Diagnosis present

## 2020-10-23 DIAGNOSIS — R2689 Other abnormalities of gait and mobility: Secondary | ICD-10-CM

## 2020-10-23 DIAGNOSIS — I69351 Hemiplegia and hemiparesis following cerebral infarction affecting right dominant side: Secondary | ICD-10-CM | POA: Diagnosis present

## 2020-10-23 DIAGNOSIS — R29818 Other symptoms and signs involving the nervous system: Secondary | ICD-10-CM | POA: Insufficient documentation

## 2020-10-23 DIAGNOSIS — R471 Dysarthria and anarthria: Secondary | ICD-10-CM | POA: Diagnosis not present

## 2020-10-23 DIAGNOSIS — M79601 Pain in right arm: Secondary | ICD-10-CM | POA: Diagnosis present

## 2020-10-23 NOTE — Therapy (Signed)
[facility] [address] [place], [place], [address] Phone: [phone]   Fax:  [phone]  Physical Therapy Evaluation  Patient Details  Name: Craig Knight MRN: [id number] Date of Birth: August 12, 1965 Referring Provider (PT): Alysia Penna MD   Encounter Date: 10/23/2020   PT End of Session - 10/23/20 0910     Visit Number 1    Number of Visits 16    Date for PT Re-Evaluation 12/18/20    Authorization Type UHC Medicare    Progress Note Due on Visit 10    PT Start Time 0900    PT Stop Time 0942    PT Time Calculation (min) 42 min    Equipment Utilized During Treatment Gait belt    Activity Tolerance Patient tolerated treatment well    Behavior During Therapy [facility] for tasks assessed/performed;Restless             Past Medical History:  Diagnosis Date   Asthma    Bronchitis    COPD (chronic obstructive pulmonary disease) ([place])    Diabetes mellitus without complication ([facility])    Heart murmur [id number]   soft systolic murmur 1/6   Hypertension    Sleep apnea    Stroke [facility])     Past Surgical History:  Procedure Laterality Date   BUBBLE STUDY  09/18/2020   Procedure: BUBBLE STUDY;  Surgeon: Pixie Casino, MD;  Location: [facility];  Service: Cardiovascular;;   COLONOSCOPY N/A 09/18/2016   Procedure: COLONOSCOPY;  Surgeon: Danie Binder, MD;  [facility];  Service: Endoscopy;  Laterality: N/A;  10:30 AM   lipoma removal     TEE WITHOUT CARDIOVERSION N/A 09/18/2020   Procedure: TRANSESOPHAGEAL ECHOCARDIOGRAM (TEE);  Surgeon: Pixie Casino, MD;  Location: [facility] END[facility];  Service: Cardiovascular;  Laterality: N/A;    There were no vitals filed for this visit.    Subjective Assessment - 10/23/20 0908     Subjective Patient presents to therapy with complaint of Rt sided weakness s/p CVA 09/15/20. Had some inpatient therapy, did well. Is wanting to progress strengthening with outpatient therapy. States  he is non-ambulatory, wife assists with all ADLs at home. His goals is to walk and move RT side.    Patient is accompained by: Family member    Pertinent History CVA    Limitations Standing;Walking    Patient Stated Goals To walk, move my right leg    Currently in Pain? No/denies                [facility] Home PT Assessment - 10/23/20 0001       Assessment   Medical Diagnosis CVA    Referring Provider (PT) Alysia Penna MD    Onset Date/Surgical Date 09/15/20   patient reported   Prior Therapy Yes, inpatient      Home Environment   Living Environment Private residence    Living Arrangements Spouse/significant other;Children    Available Help at Discharge Family      Prior Function   Level of [place] with household mobility without device      Cognition   Overall Cognitive Status Difficult to assess      Strength   Strength Assessment Site Hip;Knee;Ankle    Right/Left Hip Right;Left    Right Hip Flexion 2+/5    Left Hip Flexion 5/5    Right/Left Knee Right;Left    Right Knee Extension 1/5    Left Knee Extension 5/5    Right/Left  Ankle Right;Left    Right Ankle Dorsiflexion 0/5    Left Ankle Dorsiflexion 5/5      Transfers   Transfers Sit to Stand;Stand Pivot Transfers    Sit to Stand 4: Min guard    Stand Pivot Transfers 4: Min guard      Ambulation/Gait   Ambulation/Gait Yes    Ambulation/Gait Assistance 4: Min guard    Ambulation Distance (Feet) 2 Feet    Assistive device Rolling walker    Gait Pattern Decreased hip/knee flexion - right;Decreased dorsiflexion - right    Ambulation Surface Level;Indoor    Gait Comments apprehensive, fatigues easily                        Objective measurements completed on examination: See above findings.       [facility] Adult PT Treatment/Exercise - 10/23/20 0001       Exercises   Exercises Knee/Hip                    PT Education - 10/23/20 0909     Education Details on  evalution findings, POC and HEP    Person(s) Educated Patient;Spouse    Methods Explanation;Handout    Comprehension Verbalized understanding              PT Short Term Goals - 10/23/20 0936       PT SHORT TERM GOAL #1   Title Patient will be independent with initial HEP and self-management strategies to improve functional outcomes    Time 3    Period Weeks    Status New    Target Date 11/13/20               PT Long Term Goals - 10/23/20 0936       PT LONG TERM GOAL #1   Title Patient will be able to ambualte 150 feet using LRAD for improved funcitonal and household mobililty    Time 6    Period Weeks    Status New    Target Date 12/18/20      PT LONG TERM GOAL #2   Title Patient will be able to perform bed mobility and functional transfers mod I    Time 8    Period Weeks    Status New    Target Date 12/18/20                    Plan - 10/23/20 0915     Clinical Impression Statement Patient is a 55 y.o. male who presents to physical therapy with complaint of RT side weakness s/p CVA on 09/15/20. Patient demonstrates decreased strength, limited ROM and decreased transfer ability which are negatively impacting patient ability to perform ADLs and functional mobility tasks. Patient will benefit from skilled physical therapy services to address these deficits to improve level of function with ADLs, functional mobility tasks.    Comorbidities multiple CVAs    Examination-Activity Limitations Transfers;Locomotion Level    Examination-Participation Restrictions Community Activity;Driving;Cleaning    Stability/Clinical Decision Making Stable/Uncomplicated    Clinical Decision Making Low    Rehab Potential Fair    PT Frequency 2x / week    PT Duration 8 weeks    PT Treatment/Interventions ADLs/Self Care Home Management;Aquatic Therapy;DME Instruction;Gait training;Stair training;Functional mobility training;Therapeutic activities;Therapeutic exercise;Balance  training;Neuromuscular re-education;Patient/family education;Orthotic Fit/Training;Passive range of motion;Wheelchair mobility training;Visual/perceptual remediation/compensation;Energy conservation;Spinal Manipulations;Splinting;Joint Manipulations;Taping;Vasopneumatic Device;Compression bandaging;Manual techniques;Dry needling;Cognitive remediation;Electrical Stimulation;Cryotherapy;Biofeedback;Moist Heat;Traction;Iontophoresis '4mg'$ /ml Dexamethasone    PT  Next Visit Plan FOTO. HEP and progress funcitonal activity as tolerated, begin on mat and progress to standing> balance> gait    PT Home Exercise Plan Eval: seated march, LAQs with assist as needed    Consulted and Agree with Plan of Care Patient;Family member/caregiver             Patient will benefit from skilled therapeutic intervention in order to improve the following deficits and impairments:  Abnormal gait, Difficulty walking, Decreased endurance, Decreased activity tolerance, Decreased balance, Decreased mobility, Decreased strength, Postural dysfunction  Visit Diagnosis: Muscle weakness (generalized)  Other abnormalities of gait and mobility  Difficulty in walking, not elsewhere classified     Problem List Patient Active Problem List   Diagnosis Date Noted   Dysphagia, post-stroke    Essential hypertension    Diabetic peripheral neuropathy (HCC)    Infarction of left basal ganglia ([place]) 09/19/2020   Acute CVA (cerebrovascular accident) ([place]) 09/06/2020   Stroke ([place]) 11/23/2018   Numbness 11/23/2018   CVA (cerebral vascular accident) ([place]) 11/23/2018   Lung nodule 11/17/2017   Chest pressure 11/22/2016   Shortness of breath 11/22/2016   Depression, major, single episode, moderate ([place]) 10/14/2016   Special screening for malignant neoplasms, colon    Chronic obstructive pulmonary disease ([place]) 06/08/2016   Hypertensive emergency 02/16/2016   Hypertensive urgency 06/07/2015   CKD (chronic kidney disease), stage III  ([place]) 06/07/2015   Pain in the chest    Elevated troponin    Chronic diastolic CHF (congestive heart failure) ([place]) 09/17/2013   DM type 2 (diabetes mellitus, type 2) ([place]) 09/15/2013   Chest pain 08/01/2011   HTN (hypertension) 08/01/2011   Chest pain 07/22/2011   Renal insufficiency 07/22/2011   Tobacco abuse 07/22/2011   Hypertension    9:45 AM, 10/23/20 Josue Hector PT DPT  Physical Therapist with [place]  [facility]  [phone] [place] [address] [place], [place], [address] Phone: [phone]   Fax:  [phone]  Name: BUZ DORGAN MRN: [id number] Date of Birth: December 14, 1965

## 2020-10-23 NOTE — Therapy (Signed)
Craig Knight, [place], [address] Phone: [phone]   Fax:  [phone]  Speech Language Pathology Evaluation  Patient Details  Name: Craig Knight MRN: [id number] Date of Birth: 01/01/1966 Referring Provider (SLP): Alysia Penna, MD   Encounter Date: 10/23/2020   End of Session - 10/23/20 1638     Visit Number 1    Number of Visits 6    Date for SLP Re-Evaluation 11/20/20    Authorization Type UHC Medicare    SLP Start Time 0945    SLP Stop Time  1030    SLP Time Calculation (min) 45 min    Activity Tolerance Patient tolerated treatment well             Past Medical History:  Diagnosis Date   Asthma    Bronchitis    COPD (chronic obstructive pulmonary disease) ([place])    Diabetes mellitus without complication ([place])    Heart murmur [id number]   soft systolic murmur 1/6   Hypertension    Sleep apnea    Stroke [facility])     Past Surgical History:  Procedure Laterality Date   BUBBLE STUDY  09/18/2020   Procedure: BUBBLE STUDY;  Surgeon: Pixie Casino, MD;  Location: [facility];  Service: Cardiovascular;;   COLONOSCOPY N/A 09/18/2016   Procedure: COLONOSCOPY;  Surgeon: Danie Binder, MD;  [facility];  Service: Endoscopy;  Laterality: N/A;  10:30 AM   lipoma removal     TEE WITHOUT CARDIOVERSION N/A 09/18/2020   Procedure: TRANSESOPHAGEAL ECHOCARDIOGRAM (TEE);  Surgeon: Pixie Casino, MD;  Location[facility] ENDOSCOPY;  Service: Cardiovascular;  Laterality: N/A;    There were no vitals filed for this visit.   Subjective Assessment - 10/23/20 1626     Subjective "I stuttered before my stroke."    Patient is accompained by: Family member    Special Tests SLUMS ([place] Mental Status Examination)    Currently in Pain? No/denies                SLP Evaluation [facility] - 10/23/20 1626       SLP Visit Information   SLP Received On 10/23/20    Referring Provider (SLP)  Alysia Penna, MD    Onset Date 09/07/2020    Medical Diagnosis CVA      Subjective   Patient/Family Stated Goal "Get better"      General Information   HPI [name] is a 55 y.o. male with history of DM2, COPD, ongoing tobacco use, HTN, OSA, recent large R PCA stroke and left pontine stroke 09/06/2020 with residual L hemianopsia and mild right sided weakness and right facial droop with unclear stroke etiology. He returned to [facility] ED on 09/15/2020 with shortness of breath and worsening R sided weakness. MRI showed new acute CVA in the left basal ganglia, was not present during recent hospitalization for another CVA in the PCA area. He was discharged home from CIR on 10/13/20. Pt referred for outpatient therapies by Dr. Alysia Penna to address dense right hemiplegia, left homonymous hemianopia, dysarthria and cognitive impairment. He was accompanied to the evaluation by his wife, Craig Knight.    Behavioral/Cognition alert and cooperative    Mobility Status wheelchair      Balance Screen   Has the patient fallen in the past 6 months Yes    How many times? 1    Has the patient had a decrease in activity level  because of a fear of falling?  Yes    Is the patient reluctant to leave their home because of a fear of falling?  Yes      Prior Functional Status   Cognitive/Linguistic Baseline --   premorbid stuttering   Type of Home House     Lives With Spouse;Daughter    Available Support Family    Education graduated high school    Vocation On disability      Cognition   Overall Cognitive Status Impaired/Different from baseline   Wife reports that Pt is close to baseline   Area of Impairment Attention;Memory;Following commands    Current Attention Level Sustained    Memory Decreased short-term memory    Memory Comments 0/5 recall after a few minutes    Following Commands Follows one step commands consistently;Follows multi-step commands inconsistently    Attention Sustained     Memory Impaired    Memory Impairment Storage deficit;Retrieval deficit    Awareness Impaired    Problem Solving Impaired    Problem Solving Impairment Verbal complex;Functional complex    Futures trader complex    Self Correcting Impaired    Self Correcting Impairment Verbal complex    Behaviors Poor frustration tolerance      Auditory Comprehension   Overall Auditory Comprehension Impaired    Yes/No Questions Within Functional Limits    Commands Impaired    Two Step Basic Commands 50-74% accurate    Conversation Moderately complex    Interfering Components Attention;Working Careers information officer Within [employer]      Reading Comprehension   Reading Status Not tested   [facility] for sentences     Expression   Primary Mode of Expression Verbal      Verbal Expression   Overall Verbal Expression Appears within functional limits for tasks assessed    Initiation No impairment    Automatic Speech Name;Social Response    Level of Generative/Spontaneous Verbalization Conversation    Repetition No impairment    Naming Impairment    Responsive 76-100% accurate    Confrontation 75-100% accurate    Convergent Not tested    Divergent 50-74% accurate    Pragmatics No impairment    Interfering Components Speech intelligibility    Effective Techniques Semantic cues    Non-Verbal Means of Communication Not applicable      Written Expression   Dominant Hand Right    Written Expression Not tested    Overall Writen Expression RUE weakness      Oral Motor/Sensory Function   Overall Oral Motor/Sensory Function Impaired    Labial ROM Reduced right    Labial Symmetry Abnormal symmetry right    Lingual ROM Within Functional Limits    Lingual Symmetry Within Functional Limits    Lingual  Strength Within Functional Limits    Lingual Sensation Within Functional Limits    Lingual Coordination Reduced    Facial ROM Within Functional Limits    Facial Symmetry Within Functional Limits    Facial Strength Within Functional Limits    Facial Sensation Within Functional Limits    Facial Coordination WFL    Velum Within Functional Limits    Mandible Within Functional Limits      Motor Speech   Overall Motor Speech Impaired    Respiration Impaired    Level of Impairment Sentence  Phonation Normal    Resonance Within functional limits    Articulation Impaired    Level of Impairment Sentence    Intelligibility Intelligibility reduced    Word 75-100% accurate    Phrase 75-100% accurate    Sentence 75-100% accurate    Conversation 50-74% accurate    Motor Planning Witnin functional limits    Motor Speech Errors Inconsistent    Effective Techniques Over-articulate;Increased vocal intensity;Slow rate    Phonation [facility]               SLP Education - 10/23/20 1637     Education Details Provided folder with information regarding memory and speech intelligibility strategies    Person(s) Educated Patient;Spouse    Methods Explanation;Handout    Comprehension Verbalized understanding              SLP Short Term Goals - 10/23/20 1643       SLP SHORT TERM GOAL #1   Title Pt will implement memory strategies in functional therapy activities with 90% acc with mi/mod cues.    Baseline 25%    Time 4    Period Weeks    Status New    Target Date 11/20/20      SLP SHORT TERM GOAL #2   Title Pt will complete divergent naming tasks for basic concrete categories to 6+ items with min cues for attention to task and redirection as needed.    Baseline 5 items    Time 4    Period Weeks    Status New    Target Date 11/20/20      SLP SHORT TERM GOAL #3   Title Pt will record 3 or greater weekly appointments, reminders, to-do items in his smart phone or calendar to facilitate  task completion with min assist.    Baseline Not currently using    Time 4    Period Weeks    Status New    Target Date 11/20/20      SLP SHORT TERM GOAL #4   Title Pt will increase speech intelligibility to 90% in 1:1 conversations with SLP with min prompts for strategies and cue for error awareness.    Baseline ~75% intelligible    Time 4    Period Weeks    Status New    Target Date 11/20/20              SLP Long Term Goals - 10/23/20 1650       SLP LONG TERM GOAL #1   Title Same as short term goals              Plan - 10/23/20 1638     Clinical Impression Statement Pt presents with mild/mod cognitive deficits  characterized by reduced attention and memory skills which negatively impact executive functioning skills. Pt with mild dysarthria with mild right labial weakness/asymmetry resulting in reduced speech intellgibility for multisyllabic words and conversational speech. Pt and his wife report that pt appears to be close to his baseline, but would like to address residual deficits. SLUMS ([place] Mental Status Examination) was administered and Pt scored 13/30 with errors in mental math, delayed recall, divergent naming, and attention. Pt will benefit from skilled SLP in order to address the above impairments, maximize independence, and decrease burden of care.    Speech Therapy Frequency 2x / week    Duration --   3 weeks   Treatment/Interventions SLP instruction and feedback;Cognitive reorganization;Internal/external aids;Compensatory strategies;Compensatory techniques;Patient/family education;Cueing hierarchy  Potential to Achieve Goals Good    Potential Considerations Ability to learn/carryover information    SLP Home Exercise Plan Pt will completed HEP as assigned to facilitate carryover of treatment strategies and techniques in home environment with use of written cues as needed.    Consulted and Agree with Plan of Care Patient;Family  member/caregiver    Family Member Consulted wife, Craig Knight             Patient will benefit from skilled therapeutic intervention in order to improve the following deficits and impairments:   Dysarthria and anarthria  Cognitive communication deficit    Problem List Patient Active Problem List   Diagnosis Date Noted   Dysphagia, post-stroke    Essential hypertension    Diabetic peripheral neuropathy ([place])    Infarction of left basal ganglia ([place]) 09/19/2020   Acute CVA (cerebrovascular accident) ([place]) 09/06/2020   Stroke ([place]) 11/23/2018   Numbness 11/23/2018   CVA (cerebral vascular accident) ([place]) 11/23/2018   Lung nodule 11/17/2017   Chest pressure 11/22/2016   Shortness of breath 11/22/2016   Depression, major, single episode, moderate ([place]) 10/14/2016   Special screening for malignant neoplasms, colon    Chronic obstructive pulmonary disease ([place]) 06/08/2016   Hypertensive emergency 02/16/2016   Hypertensive urgency 06/07/2015   CKD (chronic kidney disease), stage III ([place]) 06/07/2015   Pain in the chest    Elevated troponin    Chronic diastolic CHF (congestive heart failure) ([place]) 09/17/2013   DM type 2 (diabetes mellitus, type 2) ([place]) 09/15/2013   Chest pain 08/01/2011   HTN (hypertension) 08/01/2011   Chest pain 07/22/2011   Renal insufficiency 07/22/2011   Tobacco abuse 07/22/2011   Hypertension    Thank you,  Genene Churn, CCC-SLP [id number]  [facility] 10/23/2020, 4:52 PM  [place], [place], [address] Phone: [phone]   Fax:  [phone]  Name: RUY DUNKELBERGER MRN: [id number] Date of Birth: 08/05/1965

## 2020-10-24 NOTE — Addendum Note (Signed)
Addended by: Ephraim Hamburger on: 10/24/2020 03:05 PM   Modules accepted: Orders

## 2020-10-25 ENCOUNTER — Encounter (HOSPITAL_COMMUNITY): Payer: Self-pay | Admitting: Occupational Therapy

## 2020-10-25 ENCOUNTER — Other Ambulatory Visit: Payer: Self-pay

## 2020-10-25 ENCOUNTER — Ambulatory Visit (HOSPITAL_COMMUNITY): Payer: Medicare Other | Admitting: Occupational Therapy

## 2020-10-25 DIAGNOSIS — M79601 Pain in right arm: Secondary | ICD-10-CM

## 2020-10-25 DIAGNOSIS — R471 Dysarthria and anarthria: Secondary | ICD-10-CM | POA: Diagnosis not present

## 2020-10-25 DIAGNOSIS — R278 Other lack of coordination: Secondary | ICD-10-CM

## 2020-10-25 DIAGNOSIS — I69351 Hemiplegia and hemiparesis following cerebral infarction affecting right dominant side: Secondary | ICD-10-CM

## 2020-10-25 DIAGNOSIS — R29818 Other symptoms and signs involving the nervous system: Secondary | ICD-10-CM

## 2020-10-26 ENCOUNTER — Ambulatory Visit: Payer: Medicare Other

## 2020-10-26 ENCOUNTER — Encounter: Payer: Medicare Other | Admitting: Occupational Therapy

## 2020-10-26 NOTE — Therapy (Signed)
[place] [facility] [place], [place], [address] Phone: [phone]   Fax:  [phone]  Occupational Therapy Evaluation  Patient Details  Name: Craig Knight MRN: [id number] Date of Birth: [birth year] Referring Provider (OT): Lauraine Rinne, PA-C   Encounter Date: 10/25/2020   OT End of Session - 10/25/20 1704     Visit Number 1    Number of Visits 8    Date for OT Re-Evaluation 11/24/20    Authorization Type UHC Medicare; $30 copay    Progress Note Due on Visit 10    OT Start Time 1037   pt arrived late   OT Stop Time 1110    OT Time Calculation (min) 33 min    Activity Tolerance Patient tolerated treatment well    Behavior During Therapy [facility] for tasks assessed/performed;Restless             Past Medical History:  Diagnosis Date   Asthma    Bronchitis    COPD (chronic obstructive pulmonary disease) ([place])    Diabetes mellitus without complication ([place])    Heart murmur [id number]   soft systolic murmur 1/6   Hypertension    Sleep apnea    Stroke [facility])     Past Surgical History:  Procedure Laterality Date   BUBBLE STUDY  09/18/2020   Procedure: BUBBLE STUDY;  Surgeon: Pixie Casino, MD;  Location: [place];  Service: Cardiovascular;;   COLONOSCOPY N/A 09/18/2016   Procedure: COLONOSCOPY;  Surgeon: Danie Binder, MD;  [facility];  Service: Endoscopy;  Laterality: N/A;  10:30 AM   lipoma removal     TEE WITHOUT CARDIOVERSION N/A 09/18/2020   Procedure: TRANSESOPHAGEAL ECHOCARDIOGRAM (TEE);  Surgeon: Pixie Casino, MD;  Location: [facility] ENDOSCOPY;  Service: Cardiovascular;  Laterality: N/A;    There were no vitals filed for this visit.   Subjective Assessment - 10/25/20 1655     Subjective  S: I can't use this arm.    Patient is accompanied by: Family member   [name]   Pertinent History Pt is a 55 y/o male s/p left CVA on 7/13 that evolved on 09/11/20 per chart review. Pt received CIR  services from 7/26-8/19, and has presented to OP services for evaluation and treatment referred by Lauraine Rinne, [place]. Wife present for evaluation and assisting with PLOF information.    Limitations cognitive deficits, emotional lability    Patient Stated Goals To be more independent    Currently in Pain? No/denies               [facility] OT Assessment - 10/25/20 1038       Assessment   Medical Diagnosis s/p CVA    Referring Provider (OT) Lauraine Rinne, PA-C    Onset Date/Surgical Date 09/15/20    Hand Dominance Right    Next MD Visit 11/06/20    Prior Therapy CIR      Precautions   Precautions Fall      Restrictions   Weight Bearing Restrictions No      Balance Screen   Has the patient fallen in the past 6 months Yes    How many times? 1    Has the patient had a decrease in activity level because of a fear of falling?  Yes    Is the patient reluctant to leave their home because of a fear of falling?  Yes      Prior Function   Level of Independence Independent  Vocation On disability    Leisure fishing      ADL   ADL comments Pt is unable to use RUE for ADLs and functional tasks as he has no muscle activation from shoulder down. Pt uses hemi-dressing techniques, stretches RUE throughout the day. Pt sponges bathes, does have a walk-in shower with a tub seat.      Written Expression   Dominant Hand Right      Sensation   Semmes Weinstein Monofilament Scale Normal      Coordination   Gross Motor Movements are Fluid and Coordinated No    Fine Motor Movements are Fluid and Coordinated No      ROM / Strength   AROM / PROM / Strength PROM      PROM   Overall PROM Comments RUE P/ROM is 50% flexion and abduction, er. Elbow and wrist ROM is WFL; digit ROM is [facility]      Strength   Overall Strength Comments Pt RUE strength is 0/5 currently, no active movement in RUE      RUE Tone   RUE Tone Brunnstrom Scale    Brunnstrom Scale (RUE) Slight increase in muscle tone,  manifested by a catch, followed by minimal resistance throughout the remainder (less than half) of the ROM                             OT Education - 10/25/20 1703     Education Details Discussed stroke recovery; provided self-ROM for elbow flexion/extension and forearm supination/pronation    Person(s) Educated Patient;Spouse    Methods Explanation;Demonstration;Handout    Comprehension Verbalized understanding;Returned demonstration              OT Short Term Goals - 10/26/20 0724       OT SHORT TERM GOAL #1   Title Pt and caregiver will be educated on HEP to improve independence in ADL completion.    Time 4    Period Weeks    Status New    Target Date 11/25/20      OT SHORT TERM GOAL #2   Title Pt will demonstrate correct completion of weightbearing techniques to support nerve reinervation and decrease shoulder subluxation.    Time 4    Period Weeks    Status New      OT SHORT TERM GOAL #3   Title Pt will be educated on and demonstrate proficiency in completion of adaptive strategies and techniques to improve independence in ADLs such as dressing, bathing, and simple meal preparation tasks.    Time 4    Period Weeks    Status New      OT SHORT TERM GOAL #4   Title Pt will increase P/ROM to [facility] to improve ability to complete RUE stretching tasks and decrease pain with functional tasks.    Time 4    Period Weeks    Status New      OT SHORT TERM GOAL #5   Title Pt and caregiver will demonstrate understanding of orthotics wear and donning/doffing such as sling and/or splinting to support RUE during ADLs and mobility tasks.    Time 4    Period Weeks    Status New                     Plan - 10/25/20 1705     Clinical Impression Statement A: Patient is a 55 y/o male s/p right PCA territories  infarct and also showed left pontine infarct in 7/22.  Patient with low frustration tolerance during session, educated on stroke recovery and  purpose of each discipline he will see at this clinic-PT, OT, and SP. Pt with no active movement in RUE, note 2 finger subluxation at shoulder with pain during passive stretching above 90 degrees. Pt reports numbness in right hand however scoring normal on Semmes-Weinstien testing.    OT Occupational Profile and History Detailed Assessment- Review of Records and additional review of physical, cognitive, psychosocial history related to current functional performance    Occupational performance deficits (Please refer to evaluation for details): ADL's;IADL's    Body Structure / Function / Physical Skills ADL;Dexterity;Flexibility;ROM;Strength;Vision;Balance;Coordination;Edema;FMC;IADL;Tone;UE functional use;Sensation;Endurance;Decreased knowledge of precautions;Body mechanics;Decreased knowledge of use of DME;GMC;Mobility;Proprioception;Pain    Cognitive Skills Attention;Emotional;Learn;Safety Awareness;Thought    Rehab Potential Fair    Clinical Decision Making Several treatment options, min-mod task modification necessary    Comorbidities Affecting Occupational Performance: May have comorbidities impacting occupational performance    Modification or Assistance to Complete Evaluation  Min-Moderate modification of tasks or assist with assess necessary to complete eval    OT Frequency 2x / week    OT Duration 4 weeks    OT Treatment/Interventions Self-care/ADL training;Therapeutic exercise;DME and/or AE instruction;Functional Mobility Training;Cognitive remediation/compensation;Balance training;Visual/perceptual remediation/compensation;Splinting;Manual Therapy;Neuromuscular education;Aquatic Therapy;Therapeutic activities;Patient/family education;Passive range of motion;Electrical Stimulation;Coping strategies training    Plan P: Pt will benefit from skilled OT services to improve functional use of RUE as well as training on adaptive strategies for self-care and functional task completion. Treatment plan:  weightbearing, kinesiotaping, P/ROM, NMES, adaptive strategies for self-care tasks    OT Home Exercise Plan eval: self-ROM for elbow flexion/extension, forearm supination/pronation    Consulted and Agree with Plan of Care Patient;Family member/caregiver    Family Member Consulted wife [name]             Patient will benefit from skilled therapeutic intervention in order to improve the following deficits and impairments:   Body Structure / Function / Physical Skills: ADL, Dexterity, Flexibility, ROM, Strength, Vision, Balance, Coordination, Edema, FMC, IADL, Tone, UE functional use, Sensation, Endurance, Decreased knowledge of precautions, Body mechanics, Decreased knowledge of use of DME, GMC, Mobility, Proprioception, Pain Cognitive Skills: Attention, Emotional, Learn, Safety Awareness, Thought     Visit Diagnosis: Hemiplegia and hemiparesis following cerebral infarction affecting right dominant side (HCC)  Other symptoms and signs involving the nervous system  Other lack of coordination  Pain in right arm    Problem List Patient Active Problem List   Diagnosis Date Noted   Dysphagia, post-stroke    Essential hypertension    Diabetic peripheral neuropathy (HCC)    Infarction of left basal ganglia ([place]) 09/19/2020   Acute CVA (cerebrovascular accident) ([place]) 09/06/2020   Stroke ([place]) 11/23/2018   Numbness 11/23/2018   CVA (cerebral vascular accident) ([place]) 11/23/2018   Lung nodule 11/17/2017   Chest pressure 11/22/2016   Shortness of breath 11/22/2016   Depression, major, single episode, moderate ([place]) 10/14/2016   Special screening for malignant neoplasms, colon    Chronic obstructive pulmonary disease ([place]) 06/08/2016   Hypertensive emergency 02/16/2016   Hypertensive urgency 06/07/2015   CKD (chronic kidney disease), stage III ([place]) 06/07/2015   Pain in the chest    Elevated troponin    Chronic diastolic CHF (congestive heart failure) ([place]) 09/17/2013   DM type 2  (diabetes mellitus, type 2) ([place]) 09/15/2013   Chest pain 08/01/2011   HTN (hypertension) 08/01/2011   Chest pain 07/22/2011  Renal insufficiency 07/22/2011   Tobacco abuse 07/22/2011   Hypertension     Guadelupe Sabin, OTR/L  [id number] 10/26/2020, 7:30 AM  [place], [place], [address] Phone: [phone]   Fax:  [phone]  Name: Craig Knight MRN: [id number] Date of Birth: 1965-07-04

## 2020-10-31 ENCOUNTER — Ambulatory Visit: Payer: Medicare Other

## 2020-10-31 ENCOUNTER — Ambulatory Visit (HOSPITAL_COMMUNITY): Payer: Medicare Other | Attending: Physician Assistant | Admitting: Physical Therapy

## 2020-10-31 ENCOUNTER — Encounter: Payer: Medicare Other | Admitting: Occupational Therapy

## 2020-10-31 ENCOUNTER — Other Ambulatory Visit: Payer: Self-pay

## 2020-10-31 ENCOUNTER — Encounter (HOSPITAL_COMMUNITY): Payer: Self-pay

## 2020-10-31 ENCOUNTER — Ambulatory Visit (HOSPITAL_COMMUNITY): Payer: Medicare Other

## 2020-10-31 DIAGNOSIS — M6281 Muscle weakness (generalized): Secondary | ICD-10-CM | POA: Insufficient documentation

## 2020-10-31 DIAGNOSIS — M79601 Pain in right arm: Secondary | ICD-10-CM

## 2020-10-31 DIAGNOSIS — R278 Other lack of coordination: Secondary | ICD-10-CM

## 2020-10-31 DIAGNOSIS — R471 Dysarthria and anarthria: Secondary | ICD-10-CM | POA: Diagnosis present

## 2020-10-31 DIAGNOSIS — R2689 Other abnormalities of gait and mobility: Secondary | ICD-10-CM | POA: Diagnosis present

## 2020-10-31 DIAGNOSIS — R29818 Other symptoms and signs involving the nervous system: Secondary | ICD-10-CM | POA: Diagnosis present

## 2020-10-31 DIAGNOSIS — R41841 Cognitive communication deficit: Secondary | ICD-10-CM | POA: Diagnosis present

## 2020-10-31 DIAGNOSIS — R262 Difficulty in walking, not elsewhere classified: Secondary | ICD-10-CM | POA: Insufficient documentation

## 2020-10-31 NOTE — Patient Instructions (Addendum)
Weight-bearing for Shoulder Subluxation   Weight Bearing (Forearms)   Sitting with forearms resting comfortably on table, gently lean to the side and forward over forearm. Hold __10__ seconds. Repeat __5__ times. Do __1-2__ sessions per day.    May complete this seated on edge of bed as well.       Complete the self ROM 2-3 times day. 10 times each. Hold for 2 seconds where indicated.

## 2020-10-31 NOTE — Therapy (Signed)
[place], [place], [address] Phone: [phone]   Fax:  [phone]  Occupational Therapy Treatment  Patient Details  Name: Craig Knight MRN: [id number] Date of Birth: [birth year] Referring Provider (OT): Lauraine Rinne, PA-C   Encounter Date: 10/31/2020   OT End of Session - 10/31/20 1609     Visit Number 2    Number of Visits 8    Date for OT Re-Evaluation 11/24/20    Authorization Type UHC Medicare; $30 copay    Progress Note Due on Visit 10    OT Start Time 1515    OT Stop Time 1558    OT Time Calculation (min) 43 min    Activity Tolerance Patient tolerated treatment well    Behavior During Therapy Restless;Flat affect             Past Medical History:  Diagnosis Date   Asthma    Bronchitis    COPD (chronic obstructive pulmonary disease) ([place])    Diabetes mellitus without complication ([place])    Heart murmur [id number]   soft systolic murmur 1/6   Hypertension    Sleep apnea    Stroke [facility])     Past Surgical History:  Procedure Laterality Date   BUBBLE STUDY  09/18/2020   Procedure: BUBBLE STUDY;  Surgeon: Pixie Casino, MD;  Location: [place];  Service: Cardiovascular;;   COLONOSCOPY N/A 09/18/2016   Procedure: COLONOSCOPY;  Surgeon: Danie Binder, MD;  [facility];  Service: Endoscopy;  Laterality: N/A;  10:30 AM   lipoma removal     TEE WITHOUT CARDIOVERSION N/A 09/18/2020   Procedure: TRANSESOPHAGEAL ECHOCARDIOGRAM (TEE);  Surgeon: Pixie Casino, MD;  Location: [facility] ENDOSCOPY;  Service: Cardiovascular;  Laterality: N/A;    There were no vitals filed for this visit.   Subjective Assessment - 10/31/20 1606     Subjective  S: When will this arm get better.    Patient is accompanied by: Family member   Wife: [name]   Currently in Pain? No/denies                [facility] OT Assessment - 10/31/20 1607       Assessment   Medical Diagnosis s/p CVA       Precautions   Precautions Fall                      OT Treatments/Exercises (OP) - 10/31/20 1607       Transfers   Transfers Stand to Sit;Sit to Stand    Sit to Stand 4: Min guard    Stand to Sit 4: Min guard      Neurological Re-education Exercises   Shoulder Flexion Self ROM;10 reps;Seated    Shoulder ABduction Self ROM;10 reps;Seated    Shoulder External Rotation Self ROM;10 reps;Seated    Shoulder Internal Rotation Self ROM;10 reps;Seated    Weight Bearing Position Seated    Seated with weight on forearm 5x10" on edge of mat. OT assisted with form and technique.    Seated with weight on hand No weight this session. UE placed out of flexor synergy pattern to focus on elbow extension with decreased discomfort.                    OT Education - 10/31/20 1605     Education Details reviewed goals. Self ROM for shoulder - flexion, abduction, internal and external rotation. weightbearing into forearm.  Person(s) Educated Patient;Spouse    Methods Explanation;Demonstration;Verbal cues;Handout    Comprehension Verbalized understanding;Returned demonstration              OT Short Term Goals - 10/31/20 1655       OT SHORT TERM GOAL #1   Title Pt and caregiver will be educated on HEP to improve independence in ADL completion.    Time 4    Period Weeks    Status On-going    Target Date 11/25/20      OT SHORT TERM GOAL #2   Title Pt will demonstrate correct completion of weightbearing techniques to support nerve reinervation and decrease shoulder subluxation.    Time 4    Period Weeks    Status On-going      OT SHORT TERM GOAL #3   Title Pt will be educated on and demonstrate proficiency in completion of adaptive strategies and techniques to improve independence in ADLs such as dressing, bathing, and simple meal preparation tasks.    Time 4    Period Weeks    Status On-going      OT SHORT TERM GOAL #4   Title Pt will increase P/ROM to [facility] to  improve ability to complete RUE stretching tasks and decrease pain with functional tasks.    Time 4    Period Weeks    Status On-going      OT SHORT TERM GOAL #5   Title Pt and caregiver will demonstrate understanding of orthotics wear and donning/doffing such as sling and/or splinting to support RUE during ADLs and mobility tasks.    Time 4    Period Weeks    Status On-going               OT Long Term Goals - 10/31/20 1656       OT LONG TERM GOAL #1   Title -                   Plan - 10/31/20 1610     Clinical Impression Statement A: Reviewed goals. Completed self ROM shoulder exercises and provided for HEP. Discussed weightbearing into RUE and completed using forearm due to discomfort with elbow completely extended. Provided for HEP also. Discussed wheelchair and if they have decided if they wanted a powered wheelchair or to remain with the manual. They have not decided and asked what the deadline is to make a decision. OT will send email to [facility] to ask.    Body Structure / Function / Physical Skills ADL;Dexterity;Flexibility;ROM;Strength;Vision;Balance;Coordination;Edema;FMC;IADL;Tone;UE functional use;Sensation;Endurance;Decreased knowledge of precautions;Body mechanics;Decreased knowledge of use of DME;GMC;Mobility;Proprioception;Pain    Plan P: Follow up on HEP from last session. trial kinsiotape to right shoulder to increase joint stability.    OT Home Exercise Plan eval: self-ROM for elbow flexion/extension, forearm supination/pronation 9/6: self ROM - shoulder, weightbearing into forearm    Consulted and Agree with Plan of Care Patient;Family member/caregiver    Family Member Consulted wife [name]             Patient will benefit from skilled therapeutic intervention in order to improve the following deficits and impairments:   Body Structure / Function / Physical Skills: ADL, Dexterity, Flexibility, ROM, Strength, Vision, Balance, Coordination,  Edema, FMC, IADL, Tone, UE functional use, Sensation, Endurance, Decreased knowledge of precautions, Body mechanics, Decreased knowledge of use of DME, GMC, Mobility, Proprioception, Pain       Visit Diagnosis: Other symptoms and signs involving the nervous system  Pain  in right arm  Other lack of coordination    Problem List Patient Active Problem List   Diagnosis Date Noted   Dysphagia, post-stroke    Essential hypertension    Diabetic peripheral neuropathy ([place])    Infarction of left basal ganglia ([place]) 09/19/2020   Acute CVA (cerebrovascular accident) ([place]) 09/06/2020   Stroke ([place]) 11/23/2018   Numbness 11/23/2018   CVA (cerebral vascular accident) ([place]) 11/23/2018   Lung nodule 11/17/2017   Chest pressure 11/22/2016   Shortness of breath 11/22/2016   Depression, major, single episode, moderate ([place]) 10/14/2016   Special screening for malignant neoplasms, colon    Chronic obstructive pulmonary disease ([place]) 06/08/2016   Hypertensive emergency 02/16/2016   Hypertensive urgency 06/07/2015   CKD (chronic kidney disease), stage III ([place]) 06/07/2015   Pain in the chest    Elevated troponin    Chronic diastolic CHF (congestive heart failure) ([place]) 09/17/2013   DM type 2 (diabetes mellitus, type 2) ([place]) 09/15/2013   Chest pain 08/01/2011   HTN (hypertension) 08/01/2011   Chest pain 07/22/2011   Renal insufficiency 07/22/2011   Tobacco abuse 07/22/2011   Hypertension    Ailene Ravel, OTR/L,CBIS  [id number]   10/31/2020, 4:56 PM  [place] [address] [place], [place], [address] Phone: [phone]   Fax:  [phone]  Name: Craig Knight MRN: [id number] Date of Birth: 07-Jun-1965

## 2020-10-31 NOTE — Therapy (Signed)
[facility] [address] [place], [place], [address] Phone: [phone]   Fax:  [phone]  Physical Therapy Treatment  Patient Details  Name: Craig Knight MRN: [id number] Date of Birth: May 09, 1965 Referring Provider (PT): Alysia Penna MD   Encounter Date: 10/31/2020   PT End of Session - 10/31/20 1457     Visit Number 2    Number of Visits 16    Date for PT Re-Evaluation 12/18/20    Authorization Type UHC Medicare    Progress Note Due on Visit 10    PT Start Time 1412    PT Stop Time 1450    PT Time Calculation (min) 38 min    Equipment Utilized During Treatment Gait belt    Activity Tolerance Patient tolerated treatment well    Behavior During Therapy [facility] for tasks assessed/performed;Restless             Past Medical History:  Diagnosis Date   Asthma    Bronchitis    COPD (chronic obstructive pulmonary disease) ([place])    Diabetes mellitus without complication ([place])    Heart murmur [id number]   soft systolic murmur 1/6   Hypertension    Sleep apnea    Stroke [facility])     Past Surgical History:  Procedure Laterality Date   BUBBLE STUDY  09/18/2020   Procedure: BUBBLE STUDY;  Surgeon: Pixie Casino, MD;  Location: [facility];  Service: Cardiovascular;;   COLONOSCOPY N/A 09/18/2016   Procedure: COLONOSCOPY;  Surgeon: Danie Binder, MD;  [facility];  Service: Endoscopy;  Laterality: N/A;  10:30 AM   lipoma removal     TEE WITHOUT CARDIOVERSION N/A 09/18/2020   Procedure: TRANSESOPHAGEAL ECHOCARDIOGRAM (TEE);  Surgeon: Pixie Casino, MD;  Location: [facility] ENDOSCOPY;  Service: Cardiovascular;  Laterality: N/A;    There were no vitals filed for this visit.   Subjective Assessment - 10/31/20 1426     Subjective Pt late for appt today; comes accompainied by wife in wheelchair.  AFO on Rt LE    Currently in Pain? No/denies                               [facility] Adult PT  Treatment/Exercise - 10/31/20 0001       Bed Mobility   Bed Mobility Sit to Supine;Supine to Sit    Rolling Right Minimal Assistance - Patient > 75%    Rolling Left Maximal Assistance - Patient 25-49%;Moderate Assistance - Patient 50-74%    Supine to Sit Minimal Assistance - Patient > 75%    Sit to Supine Minimal Assistance - Patient > 75%;Moderate Assistance - Patient 50-74%      Transfers   Transfers Sit to [employer] Transfers    Sit to Stand 4: Min guard    Stand Pivot Transfers 4: Min assist;3: Mod assist    Stand Pivot Transfer Details (indicate cue type and reason) Min to Lt, Mod to Right      Exercises   Exercises Knee/Hip      Knee/Hip Exercises: Seated   Long Arc Quad Right;5 reps;AAROM    Long Arc Quad Limitations little to no motion without substition of body    Marching Right;5 reps;AAROM    Marching Limitations little to no motion      Knee/Hip Exercises: Supine   Bridges 2 sets;10 reps    Bridges Limitations favoring Lt    Other  Supine Knee/Hip Exercises attempted heelslides and SAQ but unable    Other Supine Knee/Hip Exercises AAROM abd/add 5X      Knee/Hip Exercises: Sidelying   Clams 5X minimal motion    Other Sidelying Knee/Hip Exercises attempted hamstring curls, unable.                      PT Short Term Goals - 10/23/20 0936       PT SHORT TERM GOAL #1   Title Patient will be independent with initial HEP and self-management strategies to improve functional outcomes    Time 3    Period Weeks    Status New    Target Date 11/13/20               PT Long Term Goals - 10/23/20 0936       PT LONG TERM GOAL #1   Title Patient will be able to ambualte 150 feet using LRAD for improved funcitonal and household mobililty    Time 6    Period Weeks    Status New    Target Date 12/18/20      PT LONG TERM GOAL #2   Title Patient will be able to perform bed mobility and functional transfers mod I    Time 8    Period Weeks     Status New    Target Date 12/18/20                   Plan - 10/31/20 1523     Clinical Impression Statement pt accompanied by spouse today.  Reviewed goals and began treatment according to goals.  Pt with weakness requiring AAROM for all motions with Rt LE as well as transfers/bed mobiltiy.  PT is very dependent on wife to move his limbs and assist with his mobilitiy.  Educated patient he must do these things without her assistance even if it takes 5X longer to retrain and strengthen his mm.  Pt quick to say "I can't" before trying to complete task.  Requires alot of encouragement to attempt as appears to be easiliy frustrated.  Instructed to continue to work on his HEP without using his body to complete the motions.    Comorbidities multiple CVAs    Examination-Activity Limitations Transfers;Locomotion Level    Examination-Participation Restrictions Community Activity;Driving;Cleaning    Stability/Clinical Decision Making Stable/Uncomplicated    Rehab Potential Fair    PT Frequency 2x / week    PT Duration 8 weeks    PT Treatment/Interventions ADLs/Self Care Home Management;Aquatic Therapy;DME Instruction;Gait training;Stair training;Functional mobility training;Therapeutic activities;Therapeutic exercise;Balance training;Neuromuscular re-education;Patient/family education;Orthotic Fit/Training;Passive range of motion;Wheelchair mobility training;Visual/perceptual remediation/compensation;Energy conservation;Spinal Manipulations;Splinting;Joint Manipulations;Taping;Vasopneumatic Device;Compression bandaging;Manual techniques;Dry needling;Cognitive remediation;Electrical Stimulation;Cryotherapy;Biofeedback;Moist Heat;Traction;Iontophoresis '4mg'$ /ml Dexamethasone    PT Next Visit Plan Complete FOTO next session.Progress funcitonal activity as tolerated, begin on mat and progress to standing> balance> gait    PT Home Exercise Plan Eval: seated march, LAQs with assist as needed    Consulted and  Agree with Plan of Care Patient;Family member/caregiver             Patient will benefit from skilled therapeutic intervention in order to improve the following deficits and impairments:  Abnormal gait, Difficulty walking, Decreased endurance, Decreased activity tolerance, Decreased balance, Decreased mobility, Decreased strength, Postural dysfunction  Visit Diagnosis: Other abnormalities of gait and mobility  Muscle weakness (generalized)  Difficulty in walking, not elsewhere classified     Problem List Patient Active Problem List   Diagnosis  Date Noted   Dysphagia, post-stroke    Essential hypertension    Diabetic peripheral neuropathy (HCC)    Infarction of left basal ganglia ([place]) 09/19/2020   Acute CVA (cerebrovascular accident) ([place]) 09/06/2020   Stroke ([place]) 11/23/2018   Numbness 11/23/2018   CVA (cerebral vascular accident) ([place]) 11/23/2018   Lung nodule 11/17/2017   Chest pressure 11/22/2016   Shortness of breath 11/22/2016   Depression, major, single episode, moderate ([place]) 10/14/2016   Special screening for malignant neoplasms, colon    Chronic obstructive pulmonary disease ([place]) 06/08/2016   Hypertensive emergency 02/16/2016   Hypertensive urgency 06/07/2015   CKD (chronic kidney disease), stage III ([place]) 06/07/2015   Pain in the chest    Elevated troponin    Chronic diastolic CHF (congestive heart failure) ([place]) 09/17/2013   DM type 2 (diabetes mellitus, type 2) ([place]) 09/15/2013   Chest pain 08/01/2011   HTN (hypertension) 08/01/2011   Chest pain 07/22/2011   Renal insufficiency 07/22/2011   Tobacco abuse 07/22/2011   Hypertension    Ruhee Enck Sula Soda, PTA/CLT [phone]  Teena Irani 10/31/2020, 3:28 PM  [facility] [address] [place], [place], [address] Phone: [phone]   Fax:  [phone]  Name: Craig Knight MRN: [id number] Date of Birth: 1965/06/05

## 2020-11-01 ENCOUNTER — Ambulatory Visit (HOSPITAL_COMMUNITY): Payer: Medicare Other | Admitting: Speech Pathology

## 2020-11-01 ENCOUNTER — Encounter (HOSPITAL_COMMUNITY): Payer: Self-pay | Admitting: Speech Pathology

## 2020-11-01 DIAGNOSIS — R41841 Cognitive communication deficit: Secondary | ICD-10-CM

## 2020-11-01 DIAGNOSIS — R2689 Other abnormalities of gait and mobility: Secondary | ICD-10-CM | POA: Diagnosis not present

## 2020-11-01 DIAGNOSIS — R471 Dysarthria and anarthria: Secondary | ICD-10-CM

## 2020-11-01 NOTE — Therapy (Signed)
[place], [place], [address] Phone: [phone]   Fax:  [phone]  Speech Language Pathology Treatment  Patient Details  Name: Craig Knight MRN: [id number] Date of Birth: October 23, 1965 Referring Provider (SLP): Alysia Penna, MD   Encounter Date: 11/01/2020   End of Session - 11/01/20 1008     Visit Number 2    Number of Visits 6    Date for SLP Re-Evaluation 11/20/20    Authorization Type UHC Medicare   UHC MEDICARE EFF 02/26/2020  NO DED  OOP MAX $6,700.00/ $981.92 MET  $30.00 CO-PAY  NO AUTH REQ  NO VISIT LIMIT  NO CO-INS   SLP Start Time 1000    SLP Stop Time  1030    SLP Time Calculation (min) 30 min    Activity Tolerance Patient tolerated treatment well             Past Medical History:  Diagnosis Date   Asthma    Bronchitis    COPD (chronic obstructive pulmonary disease) ([place])    Diabetes mellitus without complication ([place])    Heart murmur 75/91/6384   soft systolic murmur 1/6   Hypertension    Sleep apnea    Stroke [facility])     Past Surgical History:  Procedure Laterality Date   BUBBLE STUDY  09/18/2020   Procedure: BUBBLE STUDY;  Surgeon: Pixie Casino, MD;  Location: [facility];  Service: Cardiovascular;;   COLONOSCOPY N/A 09/18/2016   Procedure: COLONOSCOPY;  Surgeon: Danie Binder, MD;  [facility];  Service: Endoscopy;  Laterality: N/A;  10:30 AM   lipoma removal     TEE WITHOUT CARDIOVERSION N/A 09/18/2020   Procedure: TRANSESOPHAGEAL ECHOCARDIOGRAM (TEE);  Surgeon: Pixie Casino, MD;  Location: [facility] ENDOSCOPY;  Service: Cardiovascular;  Laterality: N/A;    There were no vitals filed for this visit.   Subjective Assessment - 11/01/20 1007     Subjective "Same"    Patient is accompained by: Family member    Currently in Pain? No/denies               ADULT SLP TREATMENT - 11/01/20 1008       General Information   Behavior/Cognition  Alert;Cooperative;Pleasant mood    Patient Positioning Upright in chair    Oral care provided N/A    HPI Mr. Craig CALDRON is a 55 y.o. male with history of DM2, COPD, ongoing tobacco use, HTN, OSA, recent large R PCA stroke and left pontine stroke 09/06/2020 with residual L hemianopsia and mild right sided weakness and right facial droop with unclear stroke etiology. He [facility] to [facility] ED on 09/15/2020 with shortness of breath and worsening R sided weakness. MRI showed new acute CVA in the left basal ganglia, was not present during recent hospitalization for another CVA in the PCA area. He was discharged home from CIR on 10/13/20. Pt referred for outpatient therapies by Dr. Alysia Penna to address dense right hemiplegia, left homonymous hemianopia, dysarthria and cognitive impairment. He was accompanied to the evaluation by his wife, [name].      Treatment Provided   Treatment provided Cognitive-Linquistic      Pain Assessment   Pain Assessment No/denies pain      Cognitive-Linquistic Treatment   Treatment focused on Cognition;Dysarthria;Patient/family/caregiver education    Skilled Treatment SLP provided skilled treatment targeting memory and attention strategies via 10-item recall task with association cues provided. Speech intelligibility strategies targeted during conversation.  Assessment / Recommendations / Plan   Plan Continue with current plan of care      Progression Toward Goals   Progression toward goals Progressing toward goals              SLP Education - 11/01/20 1140     Education Details Reminded Pt and spouse to bring folder next session    Person(s) Educated Patient;Spouse    Comprehension Verbalized understanding              SLP Short Term Goals - 11/01/20 1009       SLP SHORT TERM GOAL #1   Title Pt will implement memory strategies in functional therapy activities with 90% acc with mi/mod cues.    Baseline 25%    Time 4    Period Weeks     Status On-going    Target Date 11/20/20      SLP SHORT TERM GOAL #2   Title Pt will complete divergent naming tasks for basic concrete categories to 6+ items with min cues for attention to task and redirection as needed.    Baseline 5 items    Time 4    Period Weeks    Status On-going    Target Date 11/20/20      SLP SHORT TERM GOAL #3   Title Pt will record 3 or greater weekly appointments, reminders, to-do items in his smart phone or calendar to facilitate task completion with min assist.    Baseline Not currently using    Time 4    Period Weeks    Status On-going    Target Date 11/20/20      SLP SHORT TERM GOAL #4   Title Pt will increase speech intelligibility to 90% in 1:1 conversations with SLP with min prompts for strategies and cue for error awareness.    Baseline ~75% intelligible    Time 4    Period Weeks    Status On-going    Target Date 11/20/20              SLP Long Term Goals - 11/01/20 1010       SLP LONG TERM GOAL #1   Title Same as short term goals              Plan - 11/01/20 1009     Clinical Impression Statement Pt completed 10 item cued recall task (association cues) with 3/10, 4/10, 6/10, and 7/10 over four trials. He was then given association cues and provided reinforcement after each presentation for the 5th trial and recalled 10/10 words. He was then able to recall 4/10 words without use of association cues. Pt was encouraged to have his wife write down important information for him at home, use calendars, repeat information, and to use association cues. Speech intelligibility was judged to be close to 100% this date without his mask in place (behind plexiglass). Continue to target goals and he was reminded to bring his folder next session.   Speech Therapy Frequency 2x / week   1-2x/ week   Duration --   3 weeks   Treatment/Interventions SLP instruction and feedback;Cognitive reorganization;Internal/external aids;Compensatory  strategies;Compensatory techniques;Patient/family education;Cueing hierarchy    Potential to Achieve Goals Good    Potential Considerations Ability to learn/carryover information    SLP Home Exercise Plan Pt will completed HEP as assigned to facilitate carryover of treatment strategies and techniques in home environment with use of written cues as needed.    Consulted and Agree  with Plan of Care Patient;Family member/caregiver    Family Member Consulted wife, [name]             Patient will benefit from skilled therapeutic intervention in order to improve the following deficits and impairments:   Dysarthria and anarthria  Cognitive communication deficit    Problem List Patient Active Problem List   Diagnosis Date Noted   Dysphagia, post-stroke    Essential hypertension    Diabetic peripheral neuropathy ([place])    Infarction of left basal ganglia ([place]) 09/19/2020   Acute CVA (cerebrovascular accident) ([place]) 09/06/2020   Stroke ([place]) 11/23/2018   Numbness 11/23/2018   CVA (cerebral vascular accident) ([place]) 11/23/2018   Lung nodule 11/17/2017   Chest pressure 11/22/2016   Shortness of breath 11/22/2016   Depression, major, single episode, moderate ([place]) 10/14/2016   Special screening for malignant neoplasms, colon    Chronic obstructive pulmonary disease ([place]) 06/08/2016   Hypertensive emergency 02/16/2016   Hypertensive urgency 06/07/2015   CKD (chronic kidney disease), stage III ([place]) 06/07/2015   Pain in the chest    Elevated troponin    Chronic diastolic CHF (congestive heart failure) ([place]) 09/17/2013   DM type 2 (diabetes mellitus, type 2) ([place]) 09/15/2013   Chest pain 08/01/2011   HTN (hypertension) 08/01/2011   Chest pain 07/22/2011   Renal insufficiency 07/22/2011   Tobacco abuse 07/22/2011   Hypertension    Thank you,  Genene Churn, CCC-SLP [phone]  David Rodriquez 11/01/2020, 11:42 AM  [facility] [address] [place], [place], [address] Phone: [phone]   Fax:  [phone]   Name: ZYRON DEELEY MRN: [id number] Date of Birth: 12/07/1965

## 2020-11-02 ENCOUNTER — Ambulatory Visit: Payer: Medicare Other | Admitting: Physical Therapy

## 2020-11-02 ENCOUNTER — Encounter (HOSPITAL_COMMUNITY): Payer: Self-pay

## 2020-11-02 ENCOUNTER — Other Ambulatory Visit: Payer: Self-pay

## 2020-11-02 ENCOUNTER — Ambulatory Visit (HOSPITAL_COMMUNITY): Payer: Medicare Other

## 2020-11-02 ENCOUNTER — Encounter: Payer: Medicare Other | Admitting: Occupational Therapy

## 2020-11-02 DIAGNOSIS — R2689 Other abnormalities of gait and mobility: Secondary | ICD-10-CM | POA: Diagnosis not present

## 2020-11-02 DIAGNOSIS — R262 Difficulty in walking, not elsewhere classified: Secondary | ICD-10-CM

## 2020-11-02 DIAGNOSIS — R278 Other lack of coordination: Secondary | ICD-10-CM

## 2020-11-02 DIAGNOSIS — M79601 Pain in right arm: Secondary | ICD-10-CM

## 2020-11-02 DIAGNOSIS — R29818 Other symptoms and signs involving the nervous system: Secondary | ICD-10-CM

## 2020-11-02 DIAGNOSIS — M6281 Muscle weakness (generalized): Secondary | ICD-10-CM

## 2020-11-02 NOTE — Therapy (Signed)
[place], [place], [address] Phone: [phone]   Fax:  [phone]  Occupational Therapy Treatment  Patient Details  Name: FURIOUS WRITER MRN: [id number] Date of Birth: 1965/03/27 Referring Provider (OT): Lauraine Rinne, PA-C   Encounter Date: 11/02/2020   OT End of Session - 11/02/20 1247     Visit Number 3    Number of Visits 8    Date for OT Re-Evaluation 11/24/20    Authorization Type UHC Medicare; $30 copay    Progress Note Due on Visit 10    OT Start Time 1115    OT Stop Time 1200    OT Time Calculation (min) 45 min    Activity Tolerance Patient tolerated treatment well    Behavior During Therapy Restless;Flat affect             Past Medical History:  Diagnosis Date   Asthma    Bronchitis    COPD (chronic obstructive pulmonary disease) ([place])    Diabetes mellitus without complication ([place])    Heart murmur [id number]   soft systolic murmur 1/6   Hypertension    Sleep apnea    Stroke [facility])     Past Surgical History:  Procedure Laterality Date   BUBBLE STUDY  09/18/2020   Procedure: BUBBLE STUDY;  Surgeon: Pixie Casino, MD;  Location: [facility];  Service: Cardiovascular;;   COLONOSCOPY N/A 09/18/2016   Procedure: COLONOSCOPY;  Surgeon: Danie Binder, MD;  [facility];  Service: Endoscopy;  Laterality: N/A;  10:30 AM   lipoma removal     TEE WITHOUT CARDIOVERSION N/A 09/18/2020   Procedure: TRANSESOPHAGEAL ECHOCARDIOGRAM (TEE);  Surgeon: Pixie Casino, MD;  Location: [facility] ENDOSCOPY;  Service: Cardiovascular;  Laterality: N/A;    There were no vitals filed for this visit.   Subjective Assessment - 11/02/20 1214     Subjective  S: It's bad that this arm jumps?    Currently in Pain? No/denies                [facility] OT Assessment - 11/02/20 1232       Assessment   Medical Diagnosis s/p CVA      Precautions   Precautions Fall                       OT Treatments/Exercises (OP) - 11/02/20 1230       Bed Mobility   Bed Mobility Sit to Supine;Supine to Sit    Supine to Sit Moderate Assistance - Patient 50-74%    Sit to Supine Moderate Assistance - Patient 50-74%      ADLs   UB Dressing Hemi dressing technique completed to donn t-shirt seated on EOM.      Exercises   Exercises Shoulder      Shoulder Exercises: Stretch   Elbow Flexion Supine;PROM;5 reps      Neurological Re-education Exercises   Shoulder Flexion Supine;PROM;5 reps    Shoulder ABduction Supine;PROM;5 reps    Shoulder External Rotation Supine;PROM;5 reps    Shoulder Internal Rotation Supine;PROM;5 reps    Elbow Extension Supine;PROM;5 reps    Forearm Supination Supine;PROM;5 reps    Forearm Pronation Supine;PROM;5 reps    Wrist Flexion Supine;PROM;5 reps    Wrist Extension Supine;PROM;5 reps      Manual Therapy   Manual Therapy Taping    Manual therapy comments Kinesiotape applied to right shoulder for GH mechanical correction.  OT Education - 11/02/20 1213     Education Details kinsiotape education, hemi dressing technique- shirt    Person(s) Educated Patient;Spouse    Methods Explanation;Demonstration;Handout;Verbal cues    Comprehension Returned demonstration;Verbalized understanding              OT Short Term Goals - 10/31/20 1655       OT SHORT TERM GOAL #1   Title Pt and caregiver will be educated on HEP to improve independence in ADL completion.    Time 4    Period Weeks    Status On-going    Target Date 11/25/20      OT SHORT TERM GOAL #2   Title Pt will demonstrate correct completion of weightbearing techniques to support nerve reinervation and decrease shoulder subluxation.    Time 4    Period Weeks    Status On-going      OT SHORT TERM GOAL #3   Title Pt will be educated on and demonstrate proficiency in completion of adaptive strategies and techniques to improve independence  in ADLs such as dressing, bathing, and simple meal preparation tasks.    Time 4    Period Weeks    Status On-going      OT SHORT TERM GOAL #4   Title Pt will increase P/ROM to [facility] to improve ability to complete RUE stretching tasks and decrease pain with functional tasks.    Time 4    Period Weeks    Status On-going      OT SHORT TERM GOAL #5   Title Pt and caregiver will demonstrate understanding of orthotics wear and donning/doffing such as sling and/or splinting to support RUE during ADLs and mobility tasks.    Time 4    Period Weeks    Status On-going               OT Long Term Goals - 10/31/20 1656       OT LONG TERM GOAL #1   Title -                   Plan - 11/02/20 1259     Clinical Impression Statement A: Kinsiotape applied to right shoulder to decrease subluxation and allow for better joint positioning. Pt reports that the tape felt good once placed. Pt demonstrates increased tone in right UE, Elbow extension and shoulder ranges (all) are primarily effected. Hemi dressing technique completed when donning t shirt after kinsiotape application. Patient required verbal cues to complete with proper form and technique. Provided handout for home.    Body Structure / Function / Physical Skills ADL;Dexterity;Flexibility;ROM;Strength;Vision;Balance;Coordination;Edema;FMC;IADL;Tone;UE functional use;Sensation;Endurance;Decreased knowledge of precautions;Body mechanics;Decreased knowledge of use of DME;GMC;Mobility;Proprioception;Pain    Cognitive Skills Attention;Emotional;Learn;Safety Awareness;Thought    Plan P: Follow on benefits of kinsiotape. Re-apply if needed.    Consulted and Agree with Plan of Care Patient;Family member/caregiver    Family Member Consulted wife [name]             Patient will benefit from skilled therapeutic intervention in order to improve the following deficits and impairments:   Body Structure / Function / Physical Skills: ADL,  Dexterity, Flexibility, ROM, Strength, Vision, Balance, Coordination, Edema, FMC, IADL, Tone, UE functional use, Sensation, Endurance, Decreased knowledge of precautions, Body mechanics, Decreased knowledge of use of DME, GMC, Mobility, Proprioception, Pain Cognitive Skills: Attention, Emotional, Learn, Safety Awareness, Thought     Visit Diagnosis: Other symptoms and signs involving the nervous system  Pain in right arm  Other  lack of coordination    Problem List Patient Active Problem List   Diagnosis Date Noted   Dysphagia, post-stroke    Essential hypertension    Diabetic peripheral neuropathy ([place])    Infarction of left basal ganglia ([place]) 09/19/2020   Acute CVA (cerebrovascular accident) ([place]) 09/06/2020   Stroke ([place]) 11/23/2018   Numbness 11/23/2018   CVA (cerebral vascular accident) ([place]) 11/23/2018   Lung nodule 11/17/2017   Chest pressure 11/22/2016   Shortness of breath 11/22/2016   Depression, major, single episode, moderate ([place]) 10/14/2016   Special screening for malignant neoplasms, colon    Chronic obstructive pulmonary disease ([place]) 06/08/2016   Hypertensive emergency 02/16/2016   Hypertensive urgency 06/07/2015   CKD (chronic kidney disease), stage III ([place]) 06/07/2015   Pain in the chest    Elevated troponin    Chronic diastolic CHF (congestive heart failure) ([place]) 09/17/2013   DM type 2 (diabetes mellitus, type 2) ([place]) 09/15/2013   Chest pain 08/01/2011   HTN (hypertension) 08/01/2011   Chest pain 07/22/2011   Renal insufficiency 07/22/2011   Tobacco abuse 07/22/2011   Hypertension    Ailene Ravel, OTR/L,CBIS  [id number]  11/02/2020, 1:11 PM  [facility] [address] [place], [place], [address] Phone: [phone]   Fax:  [phone]  Name: IKEEM SCHOENIG MRN: [id number] Date of Birth: 01-08-1966

## 2020-11-02 NOTE — Therapy (Signed)
[facility] [address] [place], [place], [address] Phone: [phone]   Fax:  [phone]  Physical Therapy Treatment  Patient Details  Name: Craig Knight MRN: [id number] Date of Birth: [birth year] Referring Provider (PT): Alysia Penna MD   Encounter Date: 11/02/2020   PT End of Session - 11/02/20 1121     Visit Number 3    Number of Visits 16    Date for PT Re-Evaluation 12/18/20    Authorization Type UHC Medicare    Progress Note Due on Visit 10    PT Start Time 1028    PT Stop Time 1108    PT Time Calculation (min) 40 min    Equipment Utilized During Treatment Gait belt    Activity Tolerance Patient tolerated treatment well    Behavior During Therapy [facility] for tasks assessed/performed;Restless             Past Medical History:  Diagnosis Date   Asthma    Bronchitis    COPD (chronic obstructive pulmonary disease) ([place])    Diabetes mellitus without complication ([place])    Heart murmur [id number]   soft systolic murmur 1/6   Hypertension    Sleep apnea    Stroke [facility])     Past Surgical History:  Procedure Laterality Date   BUBBLE STUDY  09/18/2020   Procedure: BUBBLE STUDY;  Surgeon: Pixie Casino, MD;  Location: [facility];  Service: Cardiovascular;;   COLONOSCOPY N/A 09/18/2016   Procedure: COLONOSCOPY;  Surgeon: Danie Binder, MD;  [facility];  Service: Endoscopy;  Laterality: N/A;  10:30 AM   lipoma removal     TEE WITHOUT CARDIOVERSION N/A 09/18/2020   Procedure: TRANSESOPHAGEAL ECHOCARDIOGRAM (TEE);  Surgeon: Pixie Casino, MD;  Location: [facility] ENDOSCOPY;  Service: Cardiovascular;  Laterality: N/A;    There were no vitals filed for this visit.   Subjective Assessment - 11/02/20 1120     Subjective Pt late for appt today; comes accompainied by wife in wheelchair.  AFO on Rt LE               [facility] Adult PT Treatment/Exercise - 11/02/20 0001       Bed Mobility   Bed Mobility Sit  to Supine;Supine to Sit;Right Sidelying to Sit    Rolling Left Minimal Assistance - Patient > 75%    Right Sidelying to Sit Moderate Assistance - Patient 50-74%      Transfers   Transfers Sit to Stand;Stand Pivot Transfers    Sit to Stand 4: Min guard    Stand to Sit 4: Min guard    Stand Pivot Transfers 4: Min assist;3: Mod assist    Stand Pivot Transfer Details (indicate cue type and reason) mod assist for LLE movement and placing LUE on RW      Ambulation/Gait   Ambulation/Gait Yes    Ambulation/Gait Assistance 4: Min guard    Ambulation Distance (Feet) 12 Feet   6 ftx2   Assistive device Rolling walker    Gait Pattern Decreased hip/knee flexion - right;Decreased dorsiflexion - right;Decreased weight shift to right;Right flexed knee in stance;Shuffle;Lateral trunk lean to left;Abducted- right;Poor foot clearance - right    Ambulation Surface Level;Indoor    Gait Comments apprehensive, fatigues easily      Knee/Hip Exercises: Supine   Short Arc Quad Sets AAROM;Strengthening;Right;1 set;10 reps    Heel Slides AAROM;Strengthening;Right;1 set;10 reps    Hip Adduction Isometric AAROM;Strengthening;Right;1 set;10 reps  Other Supine Knee/Hip Exercises AAROM abd/add 10X               PT Short Term Goals - 11/02/20 1127       PT SHORT TERM GOAL #1   Title Patient will be independent with initial HEP and self-management strategies to improve functional outcomes    Time 3    Period Weeks    Status On-going    Target Date 11/13/20               PT Long Term Goals - 11/02/20 1127       PT LONG TERM GOAL #1   Title Patient will be able to ambualte 150 feet using LRAD for improved funcitonal and household mobililty    Time 6    Period Weeks    Status On-going      PT LONG TERM GOAL #2   Title Patient will be able to perform bed mobility and functional transfers mod I    Time 8    Period Weeks    Status On-going                   Plan - 11/02/20 1126      Clinical Impression Statement Session focused on bed mobility, transfers, gait training and bed level exercises. Patient required assistance to complete all bed level exercises with visible muscle activation but no appreciable active motion, except with heel slides pushing leg back out to straight. Patient required assistance to maintain right hand on RW and to advance right leg during ambulation. Patient required assistance for right LE for right sidelying to sit and sit to right sidelying. Patient required cues to limited left sided muscle compensation during therapeutic exercises. Patient will continue to benefit from skilled physical therapy in order to reduce impairment and improve function.    Comorbidities multiple CVAs    Examination-Activity Limitations Transfers;Locomotion Level    Examination-Participation Restrictions Community Activity;Driving;Cleaning    Stability/Clinical Decision Making Stable/Uncomplicated    Rehab Potential Fair    PT Frequency 2x / week    PT Duration 8 weeks    PT Treatment/Interventions ADLs/Self Care Home Management;Aquatic Therapy;DME Instruction;Gait training;Stair training;Functional mobility training;Therapeutic activities;Therapeutic exercise;Balance training;Neuromuscular re-education;Patient/family education;Orthotic Fit/Training;Passive range of motion;Wheelchair mobility training;Visual/perceptual remediation/compensation;Energy conservation;Spinal Manipulations;Splinting;Joint Manipulations;Taping;Vasopneumatic Device;Compression bandaging;Manual techniques;Dry needling;Cognitive remediation;Electrical Stimulation;Cryotherapy;Biofeedback;Moist Heat;Traction;Iontophoresis '4mg'$ /ml Dexamethasone    PT Next Visit Plan Complete FOTO next session.Progress funcitonal activity as tolerated, begin on mat and progress to standing> balance> gait    PT Home Exercise Plan Eval: seated march, LAQs with assist as needed    Consulted and Agree with Plan of Care  Patient;Family member/caregiver             Patient will benefit from skilled therapeutic intervention in order to improve the following deficits and impairments:  Abnormal gait, Difficulty walking, Decreased endurance, Decreased activity tolerance, Decreased balance, Decreased mobility, Decreased strength, Postural dysfunction  Visit Diagnosis: Other abnormalities of gait and mobility  Muscle weakness (generalized)  Difficulty in walking, not elsewhere classified     Problem List Patient Active Problem List   Diagnosis Date Noted   Dysphagia, post-stroke    Essential hypertension    Diabetic peripheral neuropathy (HCC)    Infarction of left basal ganglia ([place]) 09/19/2020   Acute CVA (cerebrovascular accident) ([place]) 09/06/2020   Stroke ([place]) 11/23/2018   Numbness 11/23/2018   CVA (cerebral vascular accident) ([place]) 11/23/2018   Lung nodule 11/17/2017   Chest pressure 11/22/2016   Shortness of  breath 11/22/2016   Depression, major, single episode, moderate ([place]) 10/14/2016   Special screening for malignant neoplasms, colon    Chronic obstructive pulmonary disease ([place]) 06/08/2016   Hypertensive emergency 02/16/2016   Hypertensive urgency 06/07/2015   CKD (chronic kidney disease), stage III ([place]) 06/07/2015   Pain in the chest    Elevated troponin    Chronic diastolic CHF (congestive heart failure) ([place]) 09/17/2013   DM type 2 (diabetes mellitus, type 2) ([place]) 09/15/2013   Chest pain 08/01/2011   HTN (hypertension) 08/01/2011   Chest pain 07/22/2011   Renal insufficiency 07/22/2011   Tobacco abuse 07/22/2011   Hypertension    Pamala Hurry D. Hartnett-Rands, MS, PT Per [place] [phone]  Jeannie Done, PT 11/02/2020, 11:28 AM  [facility] [address] [place], [place], [address] Phone: [phone]   Fax:  [phone]  Name: Craig Knight MRN: [id number] Date of Birth: 1965-07-02

## 2020-11-06 ENCOUNTER — Ambulatory Visit: Payer: Medicare Other

## 2020-11-06 ENCOUNTER — Encounter: Payer: Medicare Other | Admitting: Occupational Therapy

## 2020-11-06 ENCOUNTER — Encounter (HOSPITAL_COMMUNITY): Payer: Self-pay | Admitting: Speech Pathology

## 2020-11-06 ENCOUNTER — Ambulatory Visit (HOSPITAL_COMMUNITY): Payer: Medicare Other

## 2020-11-06 ENCOUNTER — Encounter: Payer: Medicare Other | Admitting: Registered Nurse

## 2020-11-06 ENCOUNTER — Ambulatory Visit (HOSPITAL_COMMUNITY): Payer: Medicare Other | Admitting: Speech Pathology

## 2020-11-06 ENCOUNTER — Other Ambulatory Visit: Payer: Self-pay

## 2020-11-06 DIAGNOSIS — R2689 Other abnormalities of gait and mobility: Secondary | ICD-10-CM

## 2020-11-06 DIAGNOSIS — M6281 Muscle weakness (generalized): Secondary | ICD-10-CM

## 2020-11-06 DIAGNOSIS — R41841 Cognitive communication deficit: Secondary | ICD-10-CM

## 2020-11-06 DIAGNOSIS — R262 Difficulty in walking, not elsewhere classified: Secondary | ICD-10-CM

## 2020-11-06 DIAGNOSIS — R29818 Other symptoms and signs involving the nervous system: Secondary | ICD-10-CM

## 2020-11-06 DIAGNOSIS — R471 Dysarthria and anarthria: Secondary | ICD-10-CM

## 2020-11-06 NOTE — Therapy (Signed)
[place], [place], [address] Phone: [phone]   Fax:  [phone]  Speech Language Pathology Treatment  Patient Details  Name: Craig Knight MRN: [id number] Date of Birth: 03-23-1965 Referring Provider (SLP): Alysia Penna, MD   Encounter Date: 11/06/2020   End of Session - 11/06/20 0936     Visit Number 3    Number of Visits 6    Date for SLP Re-Evaluation 11/20/20    Authorization Type UHC Medicare   UHC MEDICARE EFF 02/26/2020  NO DED  OOP MAX $6,700.00/ $981.92 MET  $30.00 CO-PAY  NO AUTH REQ  NO VISIT LIMIT  NO CO-INS   SLP Start Time 0908    SLP Stop Time  0946    SLP Time Calculation (min) 38 min    Activity Tolerance Patient tolerated treatment well             Past Medical History:  Diagnosis Date   Asthma    Bronchitis    COPD (chronic obstructive pulmonary disease) ([place])    Diabetes mellitus without complication ([place])    Heart murmur 41/93/7902   soft systolic murmur 1/6   Hypertension    Sleep apnea    Stroke [facility])     Past Surgical History:  Procedure Laterality Date   BUBBLE STUDY  09/18/2020   Procedure: BUBBLE STUDY;  Surgeon: Pixie Casino, MD;  Location: [facility];  Service: Cardiovascular;;   COLONOSCOPY N/A 09/18/2016   Procedure: COLONOSCOPY;  Surgeon: Danie Binder, MD;  [facility];  Service: Endoscopy;  Laterality: N/A;  10:30 AM   lipoma removal     TEE WITHOUT CARDIOVERSION N/A 09/18/2020   Procedure: TRANSESOPHAGEAL ECHOCARDIOGRAM (TEE);  Surgeon: Pixie Casino, MD;  Location: [facility] ENDOSCOPY;  Service: Cardiovascular;  Laterality: N/A;    There were no vitals filed for this visit.   Subjective Assessment - 11/06/20 0926     Subjective "I am doing better."    Patient is accompained by: Family member    Currently in Pain? No/denies               ADULT SLP TREATMENT - 11/06/20 0931       General Information   Behavior/Cognition  Alert;Cooperative;Pleasant mood    Patient Positioning Upright in chair    Oral care provided N/A    HPI [name] is a 55 y.o. male with history of DM2, COPD, ongoing tobacco use, HTN, OSA, recent large R PCA stroke and left pontine stroke 09/06/2020 with residual L hemianopsia and mild right sided weakness and right facial droop with unclear stroke etiology. He [facility] ED on 09/15/2020 with shortness of breath and worsening R sided weakness. MRI showed new acute CVA in the left basal ganglia, was not present during recent hospitalization for another CVA in the PCA area. He was discharged home from CIR on 10/13/20. Pt referred for outpatient therapies by Dr. Alysia Penna to address dense right hemiplegia, left homonymous hemianopia, dysarthria and cognitive impairment. He was accompanied to the evaluation by his wife, Craig Knight.      Treatment Provided   Treatment provided Cognitive-Linquistic      Pain Assessment   Pain Assessment No/denies pain      Cognitive-Linquistic Treatment   Treatment focused on Cognition;Dysarthria;Patient/family/caregiver education    Skilled Treatment SLP provided skilled treatment targeting memory and attention strategies via 4-item recall task with mod assist for implementation of strategies  for recall. Speech intelligibility strategies targeted during conversation and structured picture description tasks.     Assessment / Recommendations / Plan   Plan Continue with current plan of care      Progression Toward Goals   Progression toward goals Progressing toward goals              SLP Education - 11/06/20 0932     Education Details Reminded him to bring folder    Person(s) Educated Patient;Spouse    Methods Explanation    Comprehension Verbalized understanding              SLP Short Term Goals - 11/06/20 0950       SLP SHORT TERM GOAL #1   Title Pt will implement memory strategies in functional therapy activities with 90%  acc with mi/mod cues.    Baseline 25%    Time 4    Period Weeks    Status On-going    Target Date 11/20/20      SLP SHORT TERM GOAL #2   Title Pt will complete divergent naming tasks for basic concrete categories to 6+ items with min cues for attention to task and redirection as needed.    Baseline 5 items    Time 4    Period Weeks    Status On-going    Target Date 11/20/20      SLP SHORT TERM GOAL #3   Title Pt will record 3 or greater weekly appointments, reminders, to-do items in his smart phone or calendar to facilitate task completion with min assist.    Baseline Not currently using    Time 4    Period Weeks    Status On-going    Target Date 11/20/20      SLP SHORT TERM GOAL #4   Title Pt will increase speech intelligibility to 90% in 1:1 conversations with SLP with min prompts for strategies and cue for error awareness.    Baseline ~75% intelligible    Time 4    Period Weeks    Status On-going    Target Date 11/20/20              SLP Long Term Goals - 11/06/20 0951       SLP LONG TERM GOAL #1   Title Same as short term goals              Plan - 11/06/20 0945     Clinical Impression Statement Pt worked with 4 words this session (cup, carrot, umbrella, and glasses) using association, written, repetition, story creation, and visualization strategies for recall. Pt required moderate cues for implementation. He frequently stated, "I don't know" and needed encouragement to persevere. He recalled 4/4 words after frequent repetition of tasks.  Speech intelligibility was judged to be close to 100% this date without his mask in place (behind plexiglass). Continue to target goals and he was reminded to bring his folder next session. Pt and spouse indicate that Pt is doing very well at home and are pleased with progress.   Speech Therapy Frequency 2x / week   1-2x/ week   Duration --   3 weeks   Treatment/Interventions SLP instruction and feedback;Cognitive  reorganization;Internal/external aids;Compensatory strategies;Compensatory techniques;Patient/family education;Cueing hierarchy    Potential to Achieve Goals Good    Potential Considerations Ability to learn/carryover information    SLP Home Exercise Plan Pt will completed HEP as assigned to facilitate carryover of treatment strategies and techniques in home environment with use of  written cues as needed.    Consulted and Agree with Plan of Care Patient;Family member/caregiver    Family Member Consulted wife, Craig Knight             Patient will benefit from skilled therapeutic intervention in order to improve the following deficits and impairments:   Dysarthria and anarthria  Cognitive communication deficit    Problem List Patient Active Problem List   Diagnosis Date Noted   Dysphagia, post-stroke    Essential hypertension    Diabetic peripheral neuropathy ([place])    Infarction of left basal ganglia ([place]) 09/19/2020   Acute CVA (cerebrovascular accident) ([place]) 09/06/2020   Stroke ([place]) 11/23/2018   Numbness 11/23/2018   CVA (cerebral vascular accident) ([place]) 11/23/2018   Lung nodule 11/17/2017   Chest pressure 11/22/2016   Shortness of breath 11/22/2016   Depression, major, single episode, moderate ([place]) 10/14/2016   Special screening for malignant neoplasms, colon    Chronic obstructive pulmonary disease ([place]) 06/08/2016   Hypertensive emergency 02/16/2016   Hypertensive urgency 06/07/2015   CKD (chronic kidney disease), stage III ([place]) 06/07/2015   Pain in the chest    Elevated troponin    Chronic diastolic CHF (congestive heart failure) ([place]) 09/17/2013   DM type 2 (diabetes mellitus, type 2) ([place]) 09/15/2013   Chest pain 08/01/2011   HTN (hypertension) 08/01/2011   Chest pain 07/22/2011   Renal insufficiency 07/22/2011   Tobacco abuse 07/22/2011   Hypertension    Thank you,  Genene Churn, CCC-SLP [phone]  Dquan Cortopassi 11/06/2020, 11:30 AM  [place], [place], [address] Phone: [phone]   Fax:  [phone]   Name: LYRIQ JARCHOW MRN: [id number] Date of Birth: 1965-05-08

## 2020-11-06 NOTE — Therapy (Signed)
[facility], [place], [address] Phone: [phone]   Fax:  [phone]  Physical Therapy Treatment  Patient Details  Name: Craig Knight MRN: [id number] Date of Birth: 16-Dec-1965 Referring Provider (PT): Alysia Penna MD   Encounter Date: 11/06/2020   PT End of Session - 11/06/20 0943     Visit Number 4    Number of Visits 16    Date for PT Re-Evaluation 12/18/20    Authorization Type UHC Medicare    Progress Note Due on Visit 10    PT Start Time 0945    PT Stop Time 1030    PT Time Calculation (min) 45 min    Equipment Utilized During Treatment Gait belt    Activity Tolerance Patient tolerated treatment well    Behavior During Therapy [facility] for tasks assessed/performed;Restless             Past Medical History:  Diagnosis Date   Asthma    Bronchitis    COPD (chronic obstructive pulmonary disease) ([place])    Diabetes mellitus without complication ([place])    Heart murmur [id number]   soft systolic murmur 1/6   Hypertension    Sleep apnea    Stroke [facility])     Past Surgical History:  Procedure Laterality Date   BUBBLE STUDY  09/18/2020   Procedure: BUBBLE STUDY;  Surgeon: Pixie Casino, MD;  Location: [facility];  Service: Cardiovascular;;   COLONOSCOPY N/A 09/18/2016   Procedure: COLONOSCOPY;  Surgeon: Danie Binder, MD;  [facility];  Service: Endoscopy;  Laterality: N/A;  10:30 AM   lipoma removal     TEE WITHOUT CARDIOVERSION N/A 09/18/2020   Procedure: TRANSESOPHAGEAL ECHOCARDIOGRAM (TEE);  Surgeon: Pixie Casino, MD;  Location: [facility] ENDOSCOPY;  Service: Cardiovascular;  Laterality: N/A;    There were no vitals filed for this visit.   Subjective Assessment - 11/06/20 0948     Subjective Feeling pretty good. Working with Speech Therapy.    Currently in Pain? No/denies    Pain Score 0-No pain                [facility] PT Assessment - 11/06/20 0001       Assessment    Medical Diagnosis s/p CVA    Referring Provider (PT) Alysia Penna MD    Onset Date/Surgical Date 09/15/20    Hand Dominance Right    Next MD Visit 11/06/20    Prior Therapy [facility]                           [facility] Adult PT Treatment/Exercise - 11/06/20 0001       Transfers   Transfers Sit to Stand;Stand Pivot Transfers    Sit to Stand 4: Min guard    Stand to Sit 4: Min guard    Stand Pivot Transfers 4: Min assist;3: Mod assist      Neuro Re-ed    Neuro Re-ed Details  seated muscle re-ed with massage gun and RLE offloaded to facilitate OKC right knee extension 4x5 reps with 3 sec hold improve recruitment. Standing with emphasis on lumbar and hip extension and head-up posture. Therapist stabilizing RLE in stance phase and performing LLE advancement 3x5 reps with min-mod A. RLE lift-offs with strap assist 3x5 reps to improve right hip flexion to enable swing in gait. RL stabilized for extension and performing LLE laterals, forward/backward 2x10 reps  Knee/Hip Exercises: Seated   Other Seated Knee/Hip Exercises seated alternating taps on Bosu ball                     PT Education - 11/06/20 1029     Education Details education on massage gun for muscle facilitation    Person(s) Educated Patient;Spouse    Methods Explanation;Demonstration    Comprehension Verbalized understanding              PT Short Term Goals - 11/02/20 1127       PT SHORT TERM GOAL #1   Title Patient will be independent with initial HEP and self-management strategies to improve functional outcomes    Time 3    Period Weeks    Status On-going    Target Date 11/13/20               PT Long Term Goals - 11/02/20 1127       PT LONG TERM GOAL #1   Title Patient will be able to ambualte 150 feet using LRAD for improved funcitonal and household mobililty    Time 6    Period Weeks    Status On-going      PT LONG TERM GOAL #2   Title Patient will be able to perform  bed mobility and functional transfers mod I    Time 8    Period Weeks    Status On-going                   Plan - 11/06/20 1032     Clinical Impression Statement Improved right quad activation with use of tactile cues for stimulation.  Progressing with motor control in standing requiring external/tatile support to RLE for stance control.  COntinued sessions indiatced to improve RLE strength, coordination, global motor coordination/control and progress ambulation capabilities. May need some sling support for RUE to improve upright trunk posture/position    Comorbidities multiple CVAs    Examination-Activity Limitations Transfers;Locomotion Level    Examination-Participation Restrictions Community Activity;Driving;Cleaning    Stability/Clinical Decision Making Stable/Uncomplicated    Rehab Potential Fair    PT Frequency 2x / week    PT Duration 8 weeks    PT Treatment/Interventions ADLs/Self Care Home Management;Aquatic Therapy;DME Instruction;Gait training;Stair training;Functional mobility training;Therapeutic activities;Therapeutic exercise;Balance training;Neuromuscular re-education;Patient/family education;Orthotic Fit/Training;Passive range of motion;Wheelchair mobility training;Visual/perceptual remediation/compensation;Energy conservation;Spinal Manipulations;Splinting;Joint Manipulations;Taping;Vasopneumatic Device;Compression bandaging;Manual techniques;Dry needling;Cognitive remediation;Electrical Stimulation;Cryotherapy;Biofeedback;Moist Heat;Traction;Iontophoresis '4mg'$ /ml Dexamethasone    PT Next Visit Plan Complete FOTO next session.Progress funcitonal activity as tolerated, begin on mat and progress to standing> balance> gait    PT Home Exercise Plan Eval: seated march, LAQs with assist as needed    Consulted and Agree with Plan of Care Patient;Family member/caregiver             Patient will benefit from skilled therapeutic intervention in order to improve the  following deficits and impairments:  Abnormal gait, Difficulty walking, Decreased endurance, Decreased activity tolerance, Decreased balance, Decreased mobility, Decreased strength, Postural dysfunction  Visit Diagnosis: Other abnormalities of gait and mobility  Muscle weakness (generalized)  Difficulty in walking, not elsewhere classified  Other symptoms and signs involving the nervous system     Problem List Patient Active Problem List   Diagnosis Date Noted   Dysphagia, post-stroke    Essential hypertension    Diabetic peripheral neuropathy ([place])    Infarction of left basal ganglia ([place]) 09/19/2020   Acute CVA (cerebrovascular accident) ([place]) 09/06/2020   Stroke ([place]) 11/23/2018  Numbness 11/23/2018   CVA (cerebral vascular accident) ([place]) 11/23/2018   Lung nodule 11/17/2017   Chest pressure 11/22/2016   Shortness of breath 11/22/2016   Depression, major, single episode, moderate ([place]) 10/14/2016   Special screening for malignant neoplasms, colon    Chronic obstructive pulmonary disease ([place]) 06/08/2016   Hypertensive emergency 02/16/2016   Hypertensive urgency 06/07/2015   CKD (chronic kidney disease), stage III ([place]) 06/07/2015   Pain in the chest    Elevated troponin    Chronic diastolic CHF (congestive heart failure) ([place]) 09/17/2013   DM type 2 (diabetes mellitus, type 2) ([place]) 09/15/2013   Chest pain 08/01/2011   HTN (hypertension) 08/01/2011   Chest pain 07/22/2011   Renal insufficiency 07/22/2011   Tobacco abuse 07/22/2011   Hypertension     10:36 AM, 11/06/20 M. Sherlyn Lees, PT, DPT Physical Therapist- [place] Office Number: [phone]  11/06/2020, 10:35 AM  [facility] [address] [place], [place], [address] Phone: [phone]   Fax:  [phone]  Name: ROBERTH KUMARI MRN: [id number] Date of Birth: [birth year]

## 2020-11-08 ENCOUNTER — Other Ambulatory Visit: Payer: Self-pay

## 2020-11-08 ENCOUNTER — Encounter (HOSPITAL_COMMUNITY): Payer: Self-pay

## 2020-11-08 ENCOUNTER — Ambulatory Visit (HOSPITAL_COMMUNITY): Payer: Medicare Other

## 2020-11-08 ENCOUNTER — Encounter (HOSPITAL_COMMUNITY): Payer: Self-pay | Admitting: Physical Therapy

## 2020-11-08 ENCOUNTER — Ambulatory Visit (HOSPITAL_COMMUNITY): Payer: Medicare Other | Admitting: Physical Therapy

## 2020-11-08 DIAGNOSIS — R29818 Other symptoms and signs involving the nervous system: Secondary | ICD-10-CM

## 2020-11-08 DIAGNOSIS — R2689 Other abnormalities of gait and mobility: Secondary | ICD-10-CM | POA: Diagnosis not present

## 2020-11-08 DIAGNOSIS — R278 Other lack of coordination: Secondary | ICD-10-CM

## 2020-11-08 DIAGNOSIS — R262 Difficulty in walking, not elsewhere classified: Secondary | ICD-10-CM

## 2020-11-08 DIAGNOSIS — M6281 Muscle weakness (generalized): Secondary | ICD-10-CM

## 2020-11-08 NOTE — Therapy (Signed)
[place], [place], [address] Phone: [phone]   Fax:  [phone]  Occupational Therapy Treatment  Patient Details  Name: Craig Knight MRN: [id number] Date of Birth: 1965/10/13 Referring Provider (OT): Lauraine Rinne, PA-C   Encounter Date: 11/08/2020   OT End of Session - 11/08/20 1158     Visit Number 4    Number of Visits 8    Date for OT Re-Evaluation 11/24/20    Authorization Type UHC Medicare; $30 copay    Progress Note Due on Visit 10    OT Start Time 0910   Pt arrived late   OT Stop Time 0943    OT Time Calculation (min) 33 min    Activity Tolerance Patient tolerated treatment well    Behavior During Therapy Flat affect             Past Medical History:  Diagnosis Date   Asthma    Bronchitis    COPD (chronic obstructive pulmonary disease) ([place])    Diabetes mellitus without complication ([place])    Heart murmur [id number]   soft systolic murmur 1/6   Hypertension    Sleep apnea    Stroke [facility])     Past Surgical History:  Procedure Laterality Date   BUBBLE STUDY  09/18/2020   Procedure: BUBBLE STUDY;  Surgeon: Pixie Casino, MD;  Location: [place];  Service: Cardiovascular;;   COLONOSCOPY N/A 09/18/2016   Procedure: COLONOSCOPY;  Surgeon: Danie Binder, MD;  [facility];  Service: Endoscopy;  Laterality: N/A;  10:30 AM   lipoma removal     TEE WITHOUT CARDIOVERSION N/A 09/18/2020   Procedure: TRANSESOPHAGEAL ECHOCARDIOGRAM (TEE);  Surgeon: Pixie Casino, MD;  Location: [facility] ENDOSCOPY;  Service: Cardiovascular;  Laterality: N/A;    There were no vitals filed for this visit.   Subjective Assessment - 11/08/20 1148     Subjective  S: Pt reports that he has been practicing donning his pull over shirts at home with handout provided at last session.    Currently in Pain? No/denies                [facility] OT Assessment - 11/08/20 1149       Assessment    Medical Diagnosis s/p CVA      Precautions   Precautions Fall;Other (comment)    Precaution Comments right side hemiparesis                      OT Treatments/Exercises (OP) - 11/08/20 1149       Transfers   Transfers Sit to Stand;Stand to Sit    Sit to Stand 4: Min guard    Stand to Sit 4: Min guard      Exercises   Exercises Shoulder      Neurological Re-education Exercises   Development of Reach Reaching    Reaching to Waist Dynamic standing balance task completed using elevated mat table while completing card sorting task. Cards placed in various areas around wait to mimic movement and reaching required for lower body dressing (pants of hips).    Weight Shifting Lateral    Weight-Shifting Exercises - Lateral Completed standing during card sorting task.      Manual Therapy   Manual Therapy Taping    Manual therapy comments Kinesiotape applied to right shoulder for [facility] mechanical correction.  OT Education - 11/08/20 1203     Education Details Encouraged standing while brushing his teeth at home to work on standing balance and activity tolerance which will increase his functional performance while pulling pants over hips. Pt informed that [employer] will need him to make a final decision regarding wanting a powered wheelchair or manual wheelchair before he is discharged from therapy.    Person(s) Educated Patient    Methods Explanation    Comprehension Verbalized understanding              OT Short Term Goals - 10/31/20 1655       OT SHORT TERM GOAL #1   Title Pt and caregiver will be educated on HEP to improve independence in ADL completion.    Time 4    Period Weeks    Status On-going    Target Date 11/25/20      OT SHORT TERM GOAL #2   Title Pt will demonstrate correct completion of weightbearing techniques to support nerve reinervation and decrease shoulder subluxation.    Time 4    Period Weeks    Status On-going       OT SHORT TERM GOAL #3   Title Pt will be educated on and demonstrate proficiency in completion of adaptive strategies and techniques to improve independence in ADLs such as dressing, bathing, and simple meal preparation tasks.    Time 4    Period Weeks    Status On-going      OT SHORT TERM GOAL #4   Title Pt will increase P/ROM to [facility] to improve ability to complete RUE stretching tasks and decrease pain with functional tasks.    Time 4    Period Weeks    Status On-going      OT SHORT TERM GOAL #5   Title Pt and caregiver will demonstrate understanding of orthotics wear and donning/doffing such as sling and/or splinting to support RUE during ADLs and mobility tasks.    Time 4    Period Weeks    Status On-going                 Plan - 11/08/20 1159     Clinical Impression Statement A: Kinsiotape applied again to right shoulder to help decrease subluxation and allow for better joint positioning. Pt reports that he felt the tape did help when applied. Tape only remained on for 2 days. Pt reports that he has been working on his upper body dressing with a pull over shirt as completed at last session. He reports that lower body dressing is "ok." He has the most difficulty with pulling his pants over his hips due to his fear of falling from decreased balance. Dynamic standing balance task completed to increase functional performance during lower body dressing. Verbal cues were needed to position feet appropriately (wide base of support with equal weightbearing into BLEs. Pt requried seated rest breaks due to fatigue. Verbal and tactile cues needed to maintain and/or correct standing posture as patient's fatigue level increased.    Body Structure / Function / Physical Skills ADL;Dexterity;Flexibility;ROM;Strength;Vision;Balance;Coordination;Edema;FMC;IADL;Tone;UE functional use;Sensation;Endurance;Decreased knowledge of precautions;Body mechanics;Decreased knowledge of use of  DME;GMC;Mobility;Proprioception;Pain    Plan P: Follow up on homework: Standing while brushing his teeth. Re-applying kinsiotape is needed.    Consulted and Agree with Plan of Care Patient             Patient will benefit from skilled therapeutic intervention in order to improve the following deficits and impairments:  Body Structure / Function / Physical Skills: ADL, Dexterity, Flexibility, ROM, Strength, Vision, Balance, Coordination, Edema, FMC, IADL, Tone, UE functional use, Sensation, Endurance, Decreased knowledge of precautions, Body mechanics, Decreased knowledge of use of DME, GMC, Mobility, Proprioception, Pain       Visit Diagnosis: Other lack of coordination  Other symptoms and signs involving the nervous system    Problem List Patient Active Problem List   Diagnosis Date Noted   Dysphagia, post-stroke    Essential hypertension    Diabetic peripheral neuropathy (HCC)    Infarction of left basal ganglia ([place]) 09/19/2020   Acute CVA (cerebrovascular accident) ([place]) 09/06/2020   Stroke ([place]) 11/23/2018   Numbness 11/23/2018   CVA (cerebral vascular accident) ([place]) 11/23/2018   Lung nodule 11/17/2017   Chest pressure 11/22/2016   Shortness of breath 11/22/2016   Depression, major, single episode, moderate ([place]) 10/14/2016   Special screening for malignant neoplasms, colon    Chronic obstructive pulmonary disease ([place]) 06/08/2016   Hypertensive emergency 02/16/2016   Hypertensive urgency 06/07/2015   CKD (chronic kidney disease), stage III ([place]) 06/07/2015   Pain in the chest    Elevated troponin    Chronic diastolic CHF (congestive heart failure) ([place]) 09/17/2013   DM type 2 (diabetes mellitus, type 2) ([place]) 09/15/2013   Chest pain 08/01/2011   HTN (hypertension) 08/01/2011   Chest pain 07/22/2011   Renal insufficiency 07/22/2011   Tobacco abuse 07/22/2011   Hypertension     Ailene Ravel, OTR/L,CBIS  [phone]  11/08/2020, 12:06 PM  [place], [place], [address] Phone: [phone]   Fax:  [phone]  Name: Craig Knight MRN: [id number] Date of Birth: 07-Mar-1965

## 2020-11-08 NOTE — Therapy (Signed)
[place], [place], [address] Phone: [phone]   Fax:  [phone]  Physical Therapy Treatment  Patient Details  Name: Craig Knight MRN: [id number] Date of Birth: 1965-07-15 Referring Provider (PT): Alysia Penna MD   Encounter Date: 11/08/2020   PT End of Session - 11/08/20 0917     Visit Number 5    Number of Visits 16    Date for PT Re-Evaluation 12/18/20    Authorization Type UHC Medicare    Progress Note Due on Visit 10    PT Start Time 1000    PT Stop Time 1040    PT Time Calculation (min) 40 min    Equipment Utilized During Treatment Gait belt    Activity Tolerance Patient tolerated treatment well    Behavior During Therapy [facility] for tasks assessed/performed;Restless             Past Medical History:  Diagnosis Date   Asthma    Bronchitis    COPD (chronic obstructive pulmonary disease) ([place])    Diabetes mellitus without complication ([place])    Heart murmur [id number]   soft systolic murmur 1/6   Hypertension    Sleep apnea    Stroke [facility])     Past Surgical History:  Procedure Laterality Date   BUBBLE STUDY  09/18/2020   Procedure: BUBBLE STUDY;  Surgeon: Pixie Casino, MD;  Location: [place];  Service: Cardiovascular;;   COLONOSCOPY N/A 09/18/2016   Procedure: COLONOSCOPY;  Surgeon: Danie Binder, MD;  [facility];  Service: Endoscopy;  Laterality: N/A;  10:30 AM   lipoma removal     TEE WITHOUT CARDIOVERSION N/A 09/18/2020   Procedure: TRANSESOPHAGEAL ECHOCARDIOGRAM (TEE);  Surgeon: Pixie Casino, MD;  Location[facility] ENDOSCOPY;  Service: Cardiovascular;  Laterality: N/A;    There were no vitals filed for this visit.   Subjective Assessment - 11/08/20 1010     Subjective State he has some pain in his legs 6/10 in the lower leg. States his exercises are going well at home. State he has some pain in his legs 6/10 in the lower leg. States his exercises are going  well at home.    Currently in Pain? Yes    Pain Score 5     Pain Location Leg    Pain Orientation Right    Pain Descriptors / Indicators Aching                [facility] PT Assessment - 11/08/20 0001       Assessment   Medical Diagnosis s/p CVA    Referring Provider (PT) Alysia Penna MD    Onset Date/Surgical Date 09/15/20    Hand Dominance Right    Next MD Visit 11/27/20                           [facility] Adult PT Treatment/Exercise - 11/08/20 0001       Knee/Hip Exercises: Standing   Other Standing Knee Exercises STS - focus on bringing chest towards right leg - arm up on pole R or no pole assist left hand on thighs - 25 minutes - mod assist to transfer weight on right leg practiced with and without cushions in chair    Other Standing Knee Exercises SLS with left UE support and PT assist/ blocking of knee 4x5 max attempts, standing weight shifts forward and backward with 1 UE assist and PT  assist to extend R knee; marching AAROM R x10                       PT Short Term Goals - 11/02/20 1127       PT SHORT TERM GOAL #1   Title Patient will be independent with initial HEP and self-management strategies to improve functional outcomes    Time 3    Period Weeks    Status On-going    Target Date 11/13/20               PT Long Term Goals - 11/02/20 1127       PT LONG TERM GOAL #1   Title Patient will be able to ambualte 150 feet using LRAD for improved funcitonal and household mobililty    Time 6    Period Weeks    Status On-going      PT LONG TERM GOAL #2   Title Patient will be able to perform bed mobility and functional transfers mod I    Time 8    Period Weeks    Status On-going                   Plan - 11/08/20 1043     Clinical Impression Statement Session focused on increasing weightbearing through right lower extremity. Tactile cues through right leg and muscles along with physical assist to shift weight  towards right leg. Muscle fasciculations noted in right leg secondary to fatigue. Improved quad muscle activation but patient still with difficulties with open kinetic chain exercises. Fatigue reported end of session but no increase in pain.    Comorbidities multiple CVAs    Examination-Activity Limitations Transfers;Locomotion Level    Examination-Participation Restrictions Community Activity;Driving;Cleaning    Stability/Clinical Decision Making Stable/Uncomplicated    Rehab Potential Fair    PT Frequency 2x / week    PT Duration 8 weeks    PT Treatment/Interventions ADLs/Self Care Home Management;Aquatic Therapy;DME Instruction;Gait training;Stair training;Functional mobility training;Therapeutic activities;Therapeutic exercise;Balance training;Neuromuscular re-education;Patient/family education;Orthotic Fit/Training;Passive range of motion;Wheelchair mobility training;Visual/perceptual remediation/compensation;Energy conservation;Spinal Manipulations;Splinting;Joint Manipulations;Taping;Vasopneumatic Device;Compression bandaging;Manual techniques;Dry needling;Cognitive remediation;Electrical Stimulation;Cryotherapy;Biofeedback;Moist Heat;Traction;Iontophoresis '4mg'$ /ml Dexamethasone    PT Next Visit Plan Progress funcitonal activity as tolerated, begin on mat and progress to standing> balance> gait    PT Home Exercise Plan Eval: seated march, LAQs with assist as needed    Consulted and Agree with Plan of Care Patient;Family member/caregiver             Patient will benefit from skilled therapeutic intervention in order to improve the following deficits and impairments:  Abnormal gait, Difficulty walking, Decreased endurance, Decreased activity tolerance, Decreased balance, Decreased mobility, Decreased strength, Postural dysfunction  Visit Diagnosis: Other abnormalities of gait and mobility  Muscle weakness (generalized)  Difficulty in walking, not elsewhere classified  Other symptoms  and signs involving the nervous system     Problem List Patient Active Problem List   Diagnosis Date Noted   Dysphagia, post-stroke    Essential hypertension    Diabetic peripheral neuropathy (HCC)    Infarction of left basal ganglia ([place]) 09/19/2020   Acute CVA (cerebrovascular accident) ([place]) 09/06/2020   Stroke ([place]) 11/23/2018   Numbness 11/23/2018   CVA (cerebral vascular accident) ([place]) 11/23/2018   Lung nodule 11/17/2017   Chest pressure 11/22/2016   Shortness of breath 11/22/2016   Depression, major, single episode, moderate ([place]) 10/14/2016   Special screening for malignant neoplasms, colon    Chronic obstructive pulmonary disease (  [place]) 06/08/2016   Hypertensive emergency 02/16/2016   Hypertensive urgency 06/07/2015   CKD (chronic kidney disease), stage III ([place]) 06/07/2015   Pain in the chest    Elevated troponin    Chronic diastolic CHF (congestive heart failure) ([place]) 09/17/2013   DM type 2 (diabetes mellitus, type 2) ([place]) 09/15/2013   Chest pain 08/01/2011   HTN (hypertension) 08/01/2011   Chest pain 07/22/2011   Renal insufficiency 07/22/2011   Tobacco abuse 07/22/2011   Hypertension    10:44 AM, 11/08/20 Jerene Pitch, DPT Physical Therapy with [facility]  [phone] office   [place], [place], [address] Phone: [phone]   Fax:  [phone]  Name: Craig Knight MRN: [id number] Date of Birth: 1965-09-13

## 2020-11-09 ENCOUNTER — Ambulatory Visit: Payer: Medicare Other

## 2020-11-09 ENCOUNTER — Encounter: Payer: Medicare Other | Admitting: Occupational Therapy

## 2020-11-10 ENCOUNTER — Other Ambulatory Visit: Payer: Self-pay

## 2020-11-10 ENCOUNTER — Ambulatory Visit (HOSPITAL_COMMUNITY): Payer: Medicare Other | Admitting: Occupational Therapy

## 2020-11-10 ENCOUNTER — Encounter (HOSPITAL_COMMUNITY): Payer: Self-pay | Admitting: Occupational Therapy

## 2020-11-10 DIAGNOSIS — R29818 Other symptoms and signs involving the nervous system: Secondary | ICD-10-CM

## 2020-11-10 DIAGNOSIS — R2689 Other abnormalities of gait and mobility: Secondary | ICD-10-CM | POA: Diagnosis not present

## 2020-11-10 DIAGNOSIS — R278 Other lack of coordination: Secondary | ICD-10-CM

## 2020-11-10 NOTE — Therapy (Signed)
Craig Knight, [place], [address] Phone: [phone]   Fax:  [phone]  Occupational Therapy Treatment  Patient Details  Name: Craig Knight MRN: [id number] Date of Birth: 1965-10-26 Referring Provider (OT): Craig Rinne, PA-C   Encounter Date: 11/10/2020   OT End of Session - 11/10/20 1205     Visit Number 5    Number of Visits 8    Date for OT Re-Evaluation 11/24/20    Authorization Type UHC Medicare; $30 copay    Progress Note Due on Visit 10    OT Start Time 1031    OT Stop Time 1110    OT Time Calculation (min) 39 min    Activity Tolerance Patient tolerated treatment well    Behavior During Therapy Flat affect             Past Medical History:  Diagnosis Date   Asthma    Bronchitis    COPD (chronic obstructive pulmonary disease) ([place])    Diabetes mellitus without complication ([place])    Heart murmur [id number]   soft systolic murmur 1/6   Hypertension    Sleep apnea    Stroke [facility])     Past Surgical History:  Procedure Laterality Date   BUBBLE STUDY  09/18/2020   Procedure: BUBBLE STUDY;  Surgeon: Pixie Casino, MD;  Location: [place];  Service: Cardiovascular;;   COLONOSCOPY N/A 09/18/2016   Procedure: COLONOSCOPY;  Surgeon: Danie Binder, MD;  [facility];  Service: Endoscopy;  Laterality: N/A;  10:30 AM   lipoma removal     TEE WITHOUT CARDIOVERSION N/A 09/18/2020   Procedure: TRANSESOPHAGEAL ECHOCARDIOGRAM (TEE);  Surgeon: Pixie Casino, MD;  Location: [facility] ENDOSCOPY;  Service: Cardiovascular;  Laterality: N/A;    There were no vitals filed for this visit.   Subjective Assessment - 11/10/20 1101     Subjective  S: I didn't know I was supposed to do that. (standing while brushing teeth)    Patient is accompanied by: Family member   wife-Craig Knight   Currently in Pain? No/denies                          OT Treatments/Exercises (OP) - 11/10/20 1158        Transfers   Transfers Sit to Stand;Stand to Sit    Sit to Stand 4: Min guard    Stand to Sit 4: Min guard      Neurological Re-education Exercises   Shoulder Flexion PROM;5 reps;Seated    Shoulder ABduction PROM;5 reps;Seated    Shoulder External Rotation PROM;5 reps;Seated    Shoulder Internal Rotation PROM;5 reps;Seated    Elbow Flexion PROM;10 reps;Seated    Elbow Extension PROM;10 reps;Seated    Other Exercises 1 Pt seated at mat table, BUE placed onto scooterboard with left hand securing right hand, working on self-ROM shoulder flexion by rolling scooterboard out and in. 10X    Weight Bearing Position Seated;Standing    Seated with weight on hand 2x30" on edge of mat table. OT supporting at wrist and elbow to prevent buckling and improving stability    Standing with weight shifting on and off Pt standing at raised mat table, with hand positioned on soft black block to allow finger mild finger flexion. OT supporting at elbow to prevent buckling. Pt reaching to the right for pegs and then leaning forward to place into pegboard, completing 10X with weightshifting on/off of  RUE.    Other Weight-Bearing Exercises 1 Pt seated on mat table, RUE positioned at edge of table with OT supporting at elbow and wrist. Pt leaning forward to grasp bean bags and then leaning to the right to place into bucket. Completed 15X, weightshifting on/off of RUE.    Other Weight-Bearing Exercises 2 Pt standing at mat table with both hands positioned on black block, leaning foward to weightbear on BUE, OT assisting at elbow to prevent buckling. 2x30"                      OT Short Term Goals - 10/31/20 1655       OT SHORT TERM GOAL #1   Title Pt and caregiver will be educated on HEP to improve independence in ADL completion.    Time 4    Period Weeks    Status On-going    Target Date 11/25/20      OT SHORT TERM GOAL #2   Title Pt will demonstrate correct completion of weightbearing  techniques to support nerve reinervation and decrease shoulder subluxation.    Time 4    Period Weeks    Status On-going      OT SHORT TERM GOAL #3   Title Pt will be educated on and demonstrate proficiency in completion of adaptive strategies and techniques to improve independence in ADLs such as dressing, bathing, and simple meal preparation tasks.    Time 4    Period Weeks    Status On-going      OT SHORT TERM GOAL #4   Title Pt will increase P/ROM to [facility] to improve ability to complete RUE stretching tasks and decrease pain with functional tasks.    Time 4    Period Weeks    Status On-going      OT SHORT TERM GOAL #5   Title Pt and caregiver will demonstrate understanding of orthotics wear and donning/doffing such as sling and/or splinting to support RUE during ADLs and mobility tasks.    Time 4    Period Weeks    Status On-going               OT Long Term Goals - 10/31/20 1656       OT LONG TERM GOAL #1   Title -                   Plan - 11/10/20 1207     Clinical Impression Statement A: Pt reports he has not completed brushing his teeth in standing yet. Session working on weightbearing and weight-shifting for majority of tasks, pt able to tolerate full elbow extension after passive stretching and reports no pain during weightbearing as long as his fingers are slightly flexed, therefore used black foam block during weightbearing in standing. Continued with P/ROM and self-ROM adding scooterboard for flexion stretch today. Educated pt and wife on weightbearing at home throughout the day, pt appears motivated to complete.    Body Structure / Function / Physical Skills ADL;Dexterity;Flexibility;ROM;Strength;Vision;Balance;Coordination;Edema;FMC;IADL;Tone;UE functional use;Sensation;Endurance;Decreased knowledge of precautions;Body mechanics;Decreased knowledge of use of DME;GMC;Mobility;Proprioception;Pain    Plan P: Follow up on homework: Standing while brushing  his teeth, weightbearing tasks. Re-applying kinsiotape if needed. Continue with weightbearing and NMR tasks    OT Home Exercise Plan eval: self-ROM for elbow flexion/extension, forearm supination/pronation 9/6: self ROM - shoulder, weightbearing into forearm    Consulted and Agree with Plan of Care Patient    Family Member Consulted wife [name]  Patient will benefit from skilled therapeutic intervention in order to improve the following deficits and impairments:   Body Structure / Function / Physical Skills: ADL, Dexterity, Flexibility, ROM, Strength, Vision, Balance, Coordination, Edema, FMC, IADL, Tone, UE functional use, Sensation, Endurance, Decreased knowledge of precautions, Body mechanics, Decreased knowledge of use of DME, GMC, Mobility, Proprioception, Pain       Visit Diagnosis: Other lack of coordination  Other symptoms and signs involving the nervous system    Problem List Patient Active Problem List   Diagnosis Date Noted   Dysphagia, post-stroke    Essential hypertension    Diabetic peripheral neuropathy (HCC)    Infarction of left basal ganglia ([place]) 09/19/2020   Acute CVA (cerebrovascular accident) ([place]) 09/06/2020   Stroke ([place]) 11/23/2018   Numbness 11/23/2018   CVA (cerebral vascular accident) ([place]) 11/23/2018   Lung nodule 11/17/2017   Chest pressure 11/22/2016   Shortness of breath 11/22/2016   Depression, major, single episode, moderate ([place]) 10/14/2016   Special screening for malignant neoplasms, colon    Chronic obstructive pulmonary disease ([place]) 06/08/2016   Hypertensive emergency 02/16/2016   Hypertensive urgency 06/07/2015   CKD (chronic kidney disease), stage III ([place]) 06/07/2015   Pain in the chest    Elevated troponin    Chronic diastolic CHF (congestive heart failure) ([place]) 09/17/2013   DM type 2 (diabetes mellitus, type 2) ([place]) 09/15/2013   Chest pain 08/01/2011   HTN (hypertension) 08/01/2011   Chest pain 07/22/2011    Renal insufficiency 07/22/2011   Tobacco abuse 07/22/2011   Hypertension     Guadelupe Sabin, OTR/L  [phone] 11/10/2020, 12:10 PM  [place], [place], [address] Phone: [phone]   Fax:  [phone]  Name: Craig Knight MRN: [id number] Date of Birth: 1966/01/27

## 2020-11-13 ENCOUNTER — Ambulatory Visit (HOSPITAL_COMMUNITY): Payer: Medicare Other | Admitting: Physical Therapy

## 2020-11-13 ENCOUNTER — Encounter (HOSPITAL_COMMUNITY): Payer: Self-pay | Admitting: Physical Therapy

## 2020-11-13 ENCOUNTER — Other Ambulatory Visit: Payer: Self-pay

## 2020-11-13 DIAGNOSIS — R278 Other lack of coordination: Secondary | ICD-10-CM

## 2020-11-13 DIAGNOSIS — R2689 Other abnormalities of gait and mobility: Secondary | ICD-10-CM | POA: Diagnosis not present

## 2020-11-13 DIAGNOSIS — R29818 Other symptoms and signs involving the nervous system: Secondary | ICD-10-CM

## 2020-11-13 DIAGNOSIS — M6281 Muscle weakness (generalized): Secondary | ICD-10-CM

## 2020-11-13 DIAGNOSIS — R262 Difficulty in walking, not elsewhere classified: Secondary | ICD-10-CM

## 2020-11-13 NOTE — Therapy (Signed)
[place], [place], [address] Phone: [phone]   Fax:  [phone]  Physical Therapy Treatment  Patient Details  Name: Craig Knight MRN: [id number] Date of Birth: 02-19-1966 Referring Provider (PT): Alysia Penna MD   Encounter Date: 11/13/2020   PT End of Session - 11/13/20 1036     Visit Number 6    Number of Visits 16    Date for PT Re-Evaluation 12/18/20    Authorization Type UHC Medicare    Progress Note Due on Visit 10    PT Start Time 1033    PT Stop Time 1115    PT Time Calculation (min) 42 min    Equipment Utilized During Treatment Gait belt    Activity Tolerance Patient tolerated treatment well    Behavior During Therapy [facility] for tasks assessed/performed;Restless             Past Medical History:  Diagnosis Date   Asthma    Bronchitis    COPD (chronic obstructive pulmonary disease) ([place])    Diabetes mellitus without complication ([place])    Heart murmur [id number]   soft systolic murmur 1/6   Hypertension    Sleep apnea    Stroke [facility])     Past Surgical History:  Procedure Laterality Date   BUBBLE STUDY  09/18/2020   Procedure: BUBBLE STUDY;  Surgeon: Pixie Casino, MD;  Location: [place];  Service: Cardiovascular;;   COLONOSCOPY N/A 09/18/2016   Procedure: COLONOSCOPY;  Surgeon: Danie Binder, MD;  [facility];  Service: Endoscopy;  Laterality: N/A;  10:30 AM   lipoma removal     TEE WITHOUT CARDIOVERSION N/A 09/18/2020   Procedure: TRANSESOPHAGEAL ECHOCARDIOGRAM (TEE);  Surgeon: Pixie Casino, MD;  Location: [facility] ENDOSCOPY;  Service: Cardiovascular;  Laterality: N/A;    There were no vitals filed for this visit.   Subjective Assessment - 11/13/20 1035     Subjective No new reports. No pain currently.    Currently in Pain? No/denies                               [facility] Adult PT Treatment/Exercise - 11/13/20 0001       Bed  Mobility   Bed Mobility Sitting - Scoot to Edge of Bed      Knee/Hip Exercises: Standing   Hip Flexion Right   attempted few reps with therapist assist, poor active control   Hip Abduction Right;10 reps   Therapist assist (poor active control)     Knee/Hip Exercises: Seated   Sit to Sand 2 sets;5 reps;with UE support   cues for even weight shift     Knee/Hip Exercises: Supine   Other Supine Knee/Hip Exercises assisted heel slides with isometric holds and slow eccentric lowering x 10                       PT Short Term Goals - 11/02/20 1127       PT SHORT TERM GOAL #1   Title Patient will be independent with initial HEP and self-management strategies to improve functional outcomes    Time 3    Period Weeks    Status On-going    Target Date 11/13/20               PT Long Term Goals - 11/02/20 1127       PT LONG  TERM GOAL #1   Title Patient will be able to ambualte 150 feet using LRAD for improved funcitonal and household mobililty    Time 6    Period Weeks    Status On-going      PT LONG TERM GOAL #2   Title Patient will be able to perform bed mobility and functional transfers mod I    Time 8    Period Weeks    Status On-going                   Plan - 11/13/20 1323     Clinical Impression Statement Patient continues to struggle with RT lower limb activation. He demos virtually no active movement of either hip flexor or knee extensors. He is able to support weight well on RLE during standing weight shifts however. His functional transfers have improved somewhat. Educated patient on performing RLE exercise in sidelying to improve activation in gravity minimized setting. Patient refused because he finds side lying too uncomfortable even with adjustment for his UE. Educated patient and wife on improved attempts at isometrics and gravity minimized exercised aimed at hip flexion and knee extensors to improve outcomes relative to patient goal of  becoming ambulatory.    Comorbidities multiple CVAs    Examination-Activity Limitations Transfers;Locomotion Level    Examination-Participation Restrictions Community Activity;Driving;Cleaning    Stability/Clinical Decision Making Stable/Uncomplicated    Rehab Potential Fair    PT Frequency 2x / week    PT Duration 8 weeks    PT Treatment/Interventions ADLs/Self Care Home Management;Aquatic Therapy;DME Instruction;Gait training;Stair training;Functional mobility training;Therapeutic activities;Therapeutic exercise;Balance training;Neuromuscular re-education;Patient/family education;Orthotic Fit/Training;Passive range of motion;Wheelchair mobility training;Visual/perceptual remediation/compensation;Energy conservation;Spinal Manipulations;Splinting;Joint Manipulations;Taping;Vasopneumatic Device;Compression bandaging;Manual techniques;Dry needling;Cognitive remediation;Electrical Stimulation;Cryotherapy;Biofeedback;Moist Heat;Traction;Iontophoresis '4mg'$ /ml Dexamethasone    PT Next Visit Plan Progress funcitonal activity as tolerated, begin on mat and progress to standing> balance> gait    PT Home Exercise Plan Eval: seated march, LAQs with assist as needed 9/19 assisted heelslides with iso holds to fatigue    Consulted and Agree with Plan of Care Patient;Family member/caregiver             Patient will benefit from skilled therapeutic intervention in order to improve the following deficits and impairments:  Abnormal gait, Difficulty walking, Decreased endurance, Decreased activity tolerance, Decreased balance, Decreased mobility, Decreased strength, Postural dysfunction  Visit Diagnosis: Other lack of coordination  Muscle weakness (generalized)  Difficulty in walking, not elsewhere classified  Other symptoms and signs involving the nervous system     Problem List Patient Active Problem List   Diagnosis Date Noted   Dysphagia, post-stroke    Essential hypertension    Diabetic  peripheral neuropathy (HCC)    Infarction of left basal ganglia ([place]) 09/19/2020   Acute CVA (cerebrovascular accident) ([place]) 09/06/2020   Stroke ([place]) 11/23/2018   Numbness 11/23/2018   CVA (cerebral vascular accident) ([place]) 11/23/2018   Lung nodule 11/17/2017   Chest pressure 11/22/2016   Shortness of breath 11/22/2016   Depression, major, single episode, moderate ([place]) 10/14/2016   Special screening for malignant neoplasms, colon    Chronic obstructive pulmonary disease ([place]) 06/08/2016   Hypertensive emergency 02/16/2016   Hypertensive urgency 06/07/2015   CKD (chronic kidney disease), stage III ([place]) 06/07/2015   Pain in the chest    Elevated troponin    Chronic diastolic CHF (congestive heart failure) ([place]) 09/17/2013   DM type 2 (diabetes mellitus, type 2) ([place]) 09/15/2013   Chest pain 08/01/2011   HTN (hypertension) 08/01/2011  Chest pain 07/22/2011   Renal insufficiency 07/22/2011   Tobacco abuse 07/22/2011   Hypertension    1:30 PM, 11/13/20 Josue Hector PT DPT  Physical Therapist with [facility]  [phone] [place] [address] [place], [place], [address] Phone: [phone]   Fax:  [phone]  Name: BRELAND CASILLO MRN: [id number] Date of Birth: 1965-08-25

## 2020-11-14 ENCOUNTER — Encounter (HOSPITAL_COMMUNITY): Payer: Self-pay

## 2020-11-14 ENCOUNTER — Ambulatory Visit (HOSPITAL_COMMUNITY): Payer: Medicare Other

## 2020-11-14 DIAGNOSIS — R29818 Other symptoms and signs involving the nervous system: Secondary | ICD-10-CM

## 2020-11-14 DIAGNOSIS — R2689 Other abnormalities of gait and mobility: Secondary | ICD-10-CM | POA: Diagnosis not present

## 2020-11-14 DIAGNOSIS — R278 Other lack of coordination: Secondary | ICD-10-CM

## 2020-11-14 NOTE — Therapy (Signed)
[place], [place], [address] Phone: [phone]   Fax:  [phone]  Occupational Therapy Treatment  Patient Details  Name: Craig Knight MRN: [id number] Date of Birth: Aug 04, 1965 Referring Provider (OT): Lauraine Rinne, PA-C   Encounter Date: 11/14/2020   OT End of Session - 11/14/20 1016     Visit Number 6    Number of Visits 8    Date for OT Re-Evaluation 11/24/20    Authorization Type UHC Medicare; $30 copay    Progress Note Due on Visit 10    OT Start Time 0900    OT Stop Time 0938    OT Time Calculation (min) 38 min    Activity Tolerance Patient tolerated treatment well    Behavior During Therapy [facility] for tasks assessed/performed             Past Medical History:  Diagnosis Date   Asthma    Bronchitis    COPD (chronic obstructive pulmonary disease) ([place])    Diabetes mellitus without complication ([place])    Heart murmur 70/26/3785   soft systolic murmur 1/6   Hypertension    Sleep apnea    Stroke [facility])     Past Surgical History:  Procedure Laterality Date   BUBBLE STUDY  09/18/2020   Procedure: BUBBLE STUDY;  Surgeon: Pixie Casino, MD;  Location: [place];  Service: Cardiovascular;;   COLONOSCOPY N/A 09/18/2016   Procedure: COLONOSCOPY;  Surgeon: Danie Binder, MD;  [facility];  Service: Endoscopy;  Laterality: N/A;  10:30 AM   lipoma removal     TEE WITHOUT CARDIOVERSION N/A 09/18/2020   Procedure: TRANSESOPHAGEAL ECHOCARDIOGRAM (TEE);  Surgeon: Pixie Casino, MD;  Location: [facility] ENDOSCOPY;  Service: Cardiovascular;  Laterality: N/A;    There were no vitals filed for this visit.   Subjective Assessment - 11/14/20 0924     Subjective  S: I have been standing to brush my teeth.    Patient is accompanied by: Family member   Wife: [name]   Currently in Pain? No/denies                [facility] OT Assessment - 11/14/20 0925       Assessment   Medical Diagnosis  s/p CVA      Precautions   Precautions Fall;Other (comment)    Precaution Comments right side hemiparesis                      OT Treatments/Exercises (OP) - 11/14/20 8850       ADLs   ADL Comments Wheelchair mobility completed from waiting room to treatment bay; mod I.      Exercises   Exercises Elbow;Wrist      Elbow Exercises   Elbow Flexion PROM;10 reps;Seated    Elbow Extension PROM;10 reps;Seated    Forearm Supination PROM;10 reps;Seated      Wrist Exercises   Wrist Extension PROM;10 reps;Seated      Neurological Re-education Exercises   Weight Bearing Position Standing    Standing with weight shifting on and off 3 trials completed with first trial the longest. Adjustable height mat table used. OT provided support of RUE to assist with elbow and wrist extension. Hand on blue foam cushion to allow fingers slight flexion. Sequence game used during activity.    Other Weight-Bearing Exercises 1 NDT sit to stands completed; 3 trials, right hand on right knee with left hand ontop of  right hand. Provided NDT facilitation at left hip and right shoulder during transition. Returned to seated in reverse technique and as slow as possble.                    OT Education - 11/14/20 1015     Education Details Education provided on kinsiotape technique to provide [facility] mechanical correction.    Person(s) Educated Patient;Spouse    Methods Explanation    Comprehension Verbalized understanding              OT Short Term Goals - 10/31/20 1655       OT SHORT TERM GOAL #1   Title Pt and caregiver will be educated on HEP to improve independence in ADL completion.    Time 4    Period Weeks    Status On-going    Target Date 11/25/20      OT SHORT TERM GOAL #2   Title Pt will demonstrate correct completion of weightbearing techniques to support nerve reinervation and decrease shoulder subluxation.    Time 4    Period Weeks    Status On-going      OT SHORT  TERM GOAL #3   Title Pt will be educated on and demonstrate proficiency in completion of adaptive strategies and techniques to improve independence in ADLs such as dressing, bathing, and simple meal preparation tasks.    Time 4    Period Weeks    Status On-going      OT SHORT TERM GOAL #4   Title Pt will increase P/ROM to [facility] to improve ability to complete RUE stretching tasks and decrease pain with functional tasks.    Time 4    Period Weeks    Status On-going      OT SHORT TERM GOAL #5   Title Pt and caregiver will demonstrate understanding of orthotics wear and donning/doffing such as sling and/or splinting to support RUE during ADLs and mobility tasks.    Time 4    Period Weeks    Status On-going                    Plan - 11/14/20 1017     Clinical Impression Statement A: Pt reports that he has been standing to brush his teeth and it's been going well. Educated spouse on application of kinsiotape to right shoulder to help with [facility] mechanical correction. Focused on weightbearing and weight shifting while seated and standing. Verbal and physical cues for form and technique. Mod physical cueing needed during sit to stand technique.    Body Structure / Function / Physical Skills ADL;Dexterity;Flexibility;ROM;Strength;Vision;Balance;Coordination;Edema;FMC;IADL;Tone;UE functional use;Sensation;Endurance;Decreased knowledge of precautions;Body mechanics;Decreased knowledge of use of DME;GMC;Mobility;Proprioception;Pain    Plan P: Continue with weight bearing tasks.    Consulted and Agree with Plan of Care Patient             Patient will benefit from skilled therapeutic intervention in order to improve the following deficits and impairments:   Body Structure / Function / Physical Skills: ADL, Dexterity, Flexibility, ROM, Strength, Vision, Balance, Coordination, Edema, FMC, IADL, Tone, UE functional use, Sensation, Endurance, Decreased knowledge of precautions, Body mechanics,  Decreased knowledge of use of DME, GMC, Mobility, Proprioception, Pain       Visit Diagnosis: Other lack of coordination  Other symptoms and signs involving the nervous system    Problem List Patient Active Problem List   Diagnosis Date Noted   Dysphagia, post-stroke    Essential hypertension  Diabetic peripheral neuropathy (HCC)    Infarction of left basal ganglia ([place]) 09/19/2020   Acute CVA (cerebrovascular accident) ([place]) 09/06/2020   Stroke ([place]) 11/23/2018   Numbness 11/23/2018   CVA (cerebral vascular accident) ([place]) 11/23/2018   Lung nodule 11/17/2017   Chest pressure 11/22/2016   Shortness of breath 11/22/2016   Depression, major, single episode, moderate ([place]) 10/14/2016   Special screening for malignant neoplasms, colon    Chronic obstructive pulmonary disease ([place]) 06/08/2016   Hypertensive emergency 02/16/2016   Hypertensive urgency 06/07/2015   CKD (chronic kidney disease), stage III ([place]) 06/07/2015   Pain in the chest    Elevated troponin    Chronic diastolic CHF (congestive heart failure) ([place]) 09/17/2013   DM type 2 (diabetes mellitus, type 2) ([place]) 09/15/2013   Chest pain 08/01/2011   HTN (hypertension) 08/01/2011   Chest pain 07/22/2011   Renal insufficiency 07/22/2011   Tobacco abuse 07/22/2011   Hypertension     Ailene Ravel, OTR/L,CBIS  [phone]  11/14/2020, 2:55 PM  [facility] [address] [place], [place], [address] Phone: [phone]   Fax:  [phone]  Name: VELTON ROSELLE MRN: [id number] Date of Birth: Dec 16, 1965

## 2020-11-16 ENCOUNTER — Encounter (HOSPITAL_COMMUNITY): Payer: Self-pay | Admitting: Speech Pathology

## 2020-11-16 ENCOUNTER — Ambulatory Visit (HOSPITAL_COMMUNITY): Payer: Medicare Other

## 2020-11-16 ENCOUNTER — Encounter (HOSPITAL_COMMUNITY): Payer: Self-pay

## 2020-11-16 ENCOUNTER — Other Ambulatory Visit: Payer: Self-pay

## 2020-11-16 ENCOUNTER — Ambulatory Visit (HOSPITAL_COMMUNITY): Payer: Medicare Other | Admitting: Speech Pathology

## 2020-11-16 ENCOUNTER — Encounter (HOSPITAL_COMMUNITY): Payer: Self-pay | Admitting: Occupational Therapy

## 2020-11-16 ENCOUNTER — Ambulatory Visit (HOSPITAL_COMMUNITY): Payer: Medicare Other | Admitting: Occupational Therapy

## 2020-11-16 ENCOUNTER — Telehealth (HOSPITAL_COMMUNITY): Payer: Self-pay

## 2020-11-16 DIAGNOSIS — R2689 Other abnormalities of gait and mobility: Secondary | ICD-10-CM | POA: Diagnosis not present

## 2020-11-16 DIAGNOSIS — R29818 Other symptoms and signs involving the nervous system: Secondary | ICD-10-CM

## 2020-11-16 DIAGNOSIS — R278 Other lack of coordination: Secondary | ICD-10-CM

## 2020-11-16 DIAGNOSIS — R41841 Cognitive communication deficit: Secondary | ICD-10-CM

## 2020-11-16 DIAGNOSIS — R471 Dysarthria and anarthria: Secondary | ICD-10-CM

## 2020-11-16 DIAGNOSIS — R262 Difficulty in walking, not elsewhere classified: Secondary | ICD-10-CM

## 2020-11-16 DIAGNOSIS — M6281 Muscle weakness (generalized): Secondary | ICD-10-CM

## 2020-11-16 NOTE — Telephone Encounter (Signed)
Entry error

## 2020-11-16 NOTE — Therapy (Signed)
[place], [place], [address] Phone: [phone]   Fax:  [phone]  Speech Language Pathology Treatment  Patient Details  Name: Craig Knight MRN: [id number] Date of Birth: Apr 26, 1965 Referring Provider (SLP): Alysia Penna, MD   Encounter Date: 11/16/2020   End of Session - 11/16/20 1012     Visit Number 4    Number of Visits 6    Date for SLP Re-Evaluation 11/20/20    Authorization Type UHC Medicare   UHC MEDICARE EFF 02/26/2020  NO DED  OOP MAX $6,700.00/ $981.92 MET  $30.00 CO-PAY  NO AUTH REQ  NO VISIT LIMIT  NO CO-INS   SLP Start Time 0945    Activity Tolerance Patient tolerated treatment well             Past Medical History:  Diagnosis Date   Asthma    Bronchitis    COPD (chronic obstructive pulmonary disease) ([place])    Diabetes mellitus without complication ([place])    Heart murmur 11/88/6773   soft systolic murmur 1/6   Hypertension    Sleep apnea    Stroke [facility])     Past Surgical History:  Procedure Laterality Date   BUBBLE STUDY  09/18/2020   Procedure: BUBBLE STUDY;  Surgeon: Pixie Casino, MD;  Location: [address];  Service: Cardiovascular;;   COLONOSCOPY N/A 09/18/2016   Procedure: COLONOSCOPY;  Surgeon: Danie Binder, MD;  [facility];  Service: Endoscopy;  Laterality: N/A;  10:30 AM   lipoma removal     TEE WITHOUT CARDIOVERSION N/A 09/18/2020   Procedure: TRANSESOPHAGEAL ECHOCARDIOGRAM (TEE);  Surgeon: Pixie Casino, MD;  Location: [facility] ENDOSCOPY;  Service: Cardiovascular;  Laterality: N/A;    There were no vitals filed for this visit.   Subjective Assessment - 11/16/20 0957     Subjective "Thank goodness" (today is last SLP session    Currently in Pain? No/denies                   ADULT SLP TREATMENT - 11/16/20 0958       General Information   Behavior/Cognition Alert;Cooperative;Pleasant mood    Patient Positioning Upright in chair     Oral care provided N/A    HPI [name] is a 55 y.o. male with history of DM2, COPD, ongoing tobacco use, HTN, OSA, recent large R PCA stroke and left pontine stroke 09/06/2020 with residual L hemianopsia and mild right sided weakness and right facial droop with unclear stroke etiology. He returned to [facility] ED on 09/15/2020 with shortness of breath and worsening R sided weakness. MRI showed new acute CVA in the left basal ganglia, was not present during recent hospitalization for another CVA in the PCA area. He was discharged home from CIR on 10/13/20. Pt referred for outpatient therapies by Dr. Alysia Penna to address dense right hemiplegia, left homonymous hemianopia, dysarthria and cognitive impairment. He was accompanied to the evaluation by his wife, [name].      Treatment Provided   Treatment provided Cognitive-Linquistic      Pain Assessment   Pain Assessment No/denies pain      Cognitive-Linquistic Treatment   Treatment focused on Cognition;Dysarthria;Patient/family/caregiver education    Skilled Treatment SLP provided skilled treatment targeting memory and attention strategies via word recall task with association cues provided. Speech intelligibility strategies targeted during conversation in addition to divergent naming.     Assessment / Recommendations / Plan  Plan Discharge SLP treatment due to (comment)   Pt pleased with current level     Progression Toward Goals   Progression toward goals Goals met, education completed, patient discharged from SLP              SLP Education - 11/16/20 1003     Education Details continue to use memory and speech intelligibility strategies going forward    Person(s) Educated Patient;Caregiver(s)    Methods Explanation    Comprehension Verbalized understanding              SLP Short Term Goals - 11/16/20 1020       SLP SHORT TERM GOAL #1   Title Pt will implement memory strategies in functional therapy activities  with 90% acc with mi/mod cues.    Baseline 25%    Time 4    Period Weeks    Status Achieved    Target Date 11/20/20      SLP SHORT TERM GOAL #2   Title Pt will complete divergent naming tasks for basic concrete categories to 6+ items with min cues for attention to task and redirection as needed.    Baseline 5 items    Time 4    Period Weeks    Status Achieved    Target Date 11/20/20      SLP SHORT TERM GOAL #3   Title Pt will record 3 or greater weekly appointments, reminders, to-do items in his smart phone or calendar to facilitate task completion with min assist.    Baseline Not currently using    Time 4    Period Weeks    Status Partially Met    Target Date 11/20/20      SLP SHORT TERM GOAL #4   Title Pt will increase speech intelligibility to 90% in 1:1 conversations with SLP with min prompts for strategies and cue for error awareness.    Baseline ~75% intelligible    Time 4    Period Weeks    Status Achieved    Target Date 11/20/20              SLP Long Term Goals - 11/16/20 1030       SLP LONG TERM GOAL #1   Title Same as short term goals              Plan - 11/16/20 1013     Clinical Impression Statement Pt was accompanied to therapy by his wife. They forgot to bring his folder again. SLP reviewed memory, attention, and speech intelligibility strategies in the beginning of the session and he required min cues throughout to maintain during treatment activities. He completed divergent naming tasks (fruits, vegetables, and animals) with ~10 per category (improved from 5) with initial cues to help him organize and associate. He recalled 4/4 words after 8 minutes after SLP assisted him in implementing association, visualization, and story generation strategies. After 20 minutes, Pt recalled 2/4 independently and the remaining two with min cues. Pt and wife report feeling pleased with current level of function and wish to be discharged from therapy at this time. Pt  with mild cognitive communication deficits with attention and working memory impairments. Pt has appropriate assist at home with his wife.    Speech Therapy Frequency --   1-2x/ week   Duration --   3 weeks   Treatment/Interventions SLP instruction and feedback;Cognitive reorganization;Internal/external aids;Compensatory strategies;Compensatory techniques;Patient/family education;Cueing hierarchy    Potential to Achieve Goals Good  Potential Considerations Ability to learn/carryover information    SLP Home Exercise Plan Pt will completed HEP as assigned to facilitate carryover of treatment strategies and techniques in home environment with use of written cues as needed.    Consulted and Agree with Plan of Care Patient;Family member/caregiver    Family Member Consulted wife, [name]             Patient will benefit from skilled therapeutic intervention in order to improve the following deficits and impairments:   Dysarthria and anarthria  Cognitive communication deficit    Problem List Patient Active Problem List   Diagnosis Date Noted   Dysphagia, post-stroke    Essential hypertension    Diabetic peripheral neuropathy ([place])    Infarction of left basal ganglia ([place]) 09/19/2020   Acute CVA (cerebrovascular accident) ([place]) 09/06/2020   Stroke ([place]) 11/23/2018   Numbness 11/23/2018   CVA (cerebral vascular accident) ([place]) 11/23/2018   Lung nodule 11/17/2017   Chest pressure 11/22/2016   Shortness of breath 11/22/2016   Depression, major, single episode, moderate ([place]) 10/14/2016   Special screening for malignant neoplasms, colon    Chronic obstructive pulmonary disease ([place]) 06/08/2016   Hypertensive emergency 02/16/2016   Hypertensive urgency 06/07/2015   CKD (chronic kidney disease), stage III ([place]) 06/07/2015   Pain in the chest    Elevated troponin    Chronic diastolic CHF (congestive heart failure) ([place]) 09/17/2013   DM type 2 (diabetes mellitus, type 2) ([place]) 09/15/2013    Chest pain 08/01/2011   HTN (hypertension) 08/01/2011   Chest pain 07/22/2011   Renal insufficiency 07/22/2011   Tobacco abuse 07/22/2011   Hypertension     SPEECH THERAPY DISCHARGE SUMMARY  Visits from Start of Care: 4  Current functional level related to goals / functional outcomes: Partially met, see above   Remaining deficits: Mild attention and memory deficits, see above   Education / Equipment: HEP   Patient agrees to discharge. Patient goals were partially met. Patient is being discharged due to being pleased with the current functional level..    Thank you,  Genene Churn, [place]  Genene Churn 11/16/2020, 1:26 PM  [facility] [address] [place], [place], [address] Phone: [phone]   Fax:  [phone]   Name: YAACOV KOZIOL MRN: [id number] Date of Birth: [birth year]

## 2020-11-16 NOTE — Therapy (Signed)
Craig Knight, [place], [address] Phone: [phone]   Fax:  [phone]  Occupational Therapy Treatment  Patient Details  Name: Craig Knight MRN: [id number] Date of Birth: 1965/08/12 Referring Provider (OT): Lauraine Rinne, PA-C   Encounter Date: 11/16/2020   OT End of Session - 11/16/20 0957     Visit Number 7    Number of Visits 8    Date for OT Re-Evaluation 11/24/20    Authorization Type UHC Medicare; $30 copay    Progress Note Due on Visit 10    OT Start Time 0901    OT Stop Time 0941    OT Time Calculation (min) 40 min    Activity Tolerance Patient tolerated treatment well    Behavior During Therapy [facility] for tasks assessed/performed             Past Medical History:  Diagnosis Date   Asthma    Bronchitis    COPD (chronic obstructive pulmonary disease) ([place])    Diabetes mellitus without complication ([place])    Heart murmur 62/83/6629   soft systolic murmur 1/6   Hypertension    Sleep apnea    Stroke [facility])     Past Surgical History:  Procedure Laterality Date   BUBBLE STUDY  09/18/2020   Procedure: BUBBLE STUDY;  Surgeon: Pixie Casino, MD;  Location: [place];  Service: Cardiovascular;;   COLONOSCOPY N/A 09/18/2016   Procedure: COLONOSCOPY;  Surgeon: Danie Binder, MD;  [facility];  Service: Endoscopy;  Laterality: N/A;  10:30 AM   lipoma removal     TEE WITHOUT CARDIOVERSION N/A 09/18/2020   Procedure: TRANSESOPHAGEAL ECHOCARDIOGRAM (TEE);  Surgeon: Pixie Casino, MD;  Location: [facility] ENDOSCOPY;  Service: Cardiovascular;  Laterality: N/A;    There were no vitals filed for this visit.   Subjective Assessment - 11/16/20 0903     Subjective  S: My hand is tight and aching.    Patient is accompanied by: Family member   wife   Currently in Pain? Yes    Pain Score 5     Pain Location Hand    Pain Orientation Right    Pain Descriptors / Indicators Aching    Pain Type  Acute pain    Pain Radiating Towards N/A    Pain Onset Yesterday    Pain Frequency Constant    Aggravating Factors  unsure    Pain Relieving Factors unsure    Effect of Pain on Daily Activities None    Multiple Pain Sites No                [facility] OT Assessment - 11/16/20 0903       Assessment   Medical Diagnosis s/p CVA      Precautions   Precautions Fall;Other (comment)    Precaution Comments right side hemiparesis                      OT Treatments/Exercises (OP) - 11/16/20 4765       Neurological Re-education Exercises   Shoulder Flexion PROM;5 reps;Seated    Shoulder ABduction PROM;5 reps;Seated    Shoulder External Rotation PROM;5 reps;Seated    Shoulder Internal Rotation PROM;5 reps;Seated    Elbow Flexion PROM;10 reps;Seated    Elbow Extension PROM;10 reps;Seated    Wrist Extension PROM;10 reps;Seated    Weight Bearing Position Standing    Standing with weight shifting on and off Pt standing at  adjustable table in ADL room with hand propped on black foam square for first trial, transitioned to towel roll for 2nd trial. Pt playing checkers game with OT, weightshifting on/off to place and move checkers. OT providing support at elbow to prevent buckling. Pt completed a 3rd trial with wife providing tactile support and OT educating on technique for assistance.                      OT Short Term Goals - 10/31/20 1655       OT SHORT TERM GOAL #1   Title Pt and caregiver will be educated on HEP to improve independence in ADL completion.    Time 4    Period Weeks    Status On-going    Target Date 11/25/20      OT SHORT TERM GOAL #2   Title Pt will demonstrate correct completion of weightbearing techniques to support nerve reinervation and decrease shoulder subluxation.    Time 4    Period Weeks    Status On-going      OT SHORT TERM GOAL #3   Title Pt will be educated on and demonstrate proficiency in completion of adaptive strategies  and techniques to improve independence in ADLs such as dressing, bathing, and simple meal preparation tasks.    Time 4    Period Weeks    Status On-going      OT SHORT TERM GOAL #4   Title Pt will increase P/ROM to [facility] to improve ability to complete RUE stretching tasks and decrease pain with functional tasks.    Time 4    Period Weeks    Status On-going      OT SHORT TERM GOAL #5   Title Pt and caregiver will demonstrate understanding of orthotics wear and donning/doffing such as sling and/or splinting to support RUE during ADLs and mobility tasks.    Time 4    Period Weeks    Status On-going                   Plan - 11/16/20 0957     Clinical Impression Statement A: Pt reporting increased pain and soreness today. Pt presenting with increased flexor tone, sustained passive stretching required to achieve full elbow extension. After passive stretching session focusing on weight bearing and weight shifting on and off RUE. OT providing assistance at elbow to prevent buckling. On 3rd trial of weight shifting OT had wife provide support and OT cuing for technique. Verbal cuing for form and technique, also educating on increased tone and pain and need for HEP completion to address when not at therapy.    Body Structure / Function / Physical Skills ADL;Dexterity;Flexibility;ROM;Strength;Vision;Balance;Coordination;Edema;FMC;IADL;Tone;UE functional use;Sensation;Endurance;Decreased knowledge of precautions;Body mechanics;Decreased knowledge of use of DME;GMC;Mobility;Proprioception;Pain    Plan P: Continue with weight bearing tasks, follow up on HEP completion    OT Home Exercise Plan eval: self-ROM for elbow flexion/extension, forearm supination/pronation 9/6: self ROM - shoulder, weightbearing into forearm    Consulted and Agree with Plan of Care Patient    Family Member Consulted wife [name]             Patient will benefit from skilled therapeutic intervention in order to improve  the following deficits and impairments:   Body Structure / Function / Physical Skills: ADL, Dexterity, Flexibility, ROM, Strength, Vision, Balance, Coordination, Edema, FMC, IADL, Tone, UE functional use, Sensation, Endurance, Decreased knowledge of precautions, Body mechanics, Decreased knowledge of use of DME, GMC,  Mobility, Proprioception, Pain       Visit Diagnosis: Other lack of coordination  Other symptoms and signs involving the nervous system    Problem List Patient Active Problem List   Diagnosis Date Noted   Dysphagia, post-stroke    Essential hypertension    Diabetic peripheral neuropathy (HCC)    Infarction of left basal ganglia ([place]) 09/19/2020   Acute CVA (cerebrovascular accident) ([place]) 09/06/2020   Stroke ([place]) 11/23/2018   Numbness 11/23/2018   CVA (cerebral vascular accident) ([place]) 11/23/2018   Lung nodule 11/17/2017   Chest pressure 11/22/2016   Shortness of breath 11/22/2016   Depression, major, single episode, moderate ([place]) 10/14/2016   Special screening for malignant neoplasms, colon    Chronic obstructive pulmonary disease ([place]) 06/08/2016   Hypertensive emergency 02/16/2016   Hypertensive urgency 06/07/2015   CKD (chronic kidney disease), stage III ([place]) 06/07/2015   Pain in the chest    Elevated troponin    Chronic diastolic CHF (congestive heart failure) ([place]) 09/17/2013   DM type 2 (diabetes mellitus, type 2) ([place]) 09/15/2013   Chest pain 08/01/2011   HTN (hypertension) 08/01/2011   Chest pain 07/22/2011   Renal insufficiency 07/22/2011   Tobacco abuse 07/22/2011   Hypertension    Guadelupe Sabin, OTR/L  [phone] 11/16/2020, 10:04 AM  [place] [address] [place], [place], [address] Phone: [phone]   Fax:  [phone]  Name: Craig Knight MRN: [id number] Date of Birth: 1965/05/15

## 2020-11-16 NOTE — Therapy (Signed)
[place], [place], [address] Phone: [phone]   Fax:  [phone]  Physical Therapy Treatment  Patient Details  Name: Craig Knight MRN: [id number] Date of Birth: [birth year] Referring Provider (PT): Alysia Penna MD   Encounter Date: 11/16/2020   PT End of Session - 11/16/20 1231     Visit Number 7    Number of Visits 16    Date for PT Re-Evaluation 12/18/20    Authorization Type UHC Medicare    Progress Note Due on Visit 10    PT Start Time 1033    PT Stop Time 1118    PT Time Calculation (min) 45 min    Equipment Utilized During Treatment Gait belt    Activity Tolerance Patient tolerated treatment well    Behavior During Therapy [facility] for tasks assessed/performed             Past Medical History:  Diagnosis Date   Asthma    Bronchitis    COPD (chronic obstructive pulmonary disease) ([place])    Diabetes mellitus without complication ([place])    Heart murmur 63/87/5643   soft systolic murmur 1/6   Hypertension    Sleep apnea    Stroke [facility])     Past Surgical History:  Procedure Laterality Date   BUBBLE STUDY  09/18/2020   Procedure: BUBBLE STUDY;  Surgeon: Pixie Casino, MD;  Location: [place];  Service: Cardiovascular;;   COLONOSCOPY N/A 09/18/2016   Procedure: COLONOSCOPY;  Surgeon: Danie Binder, MD;  [facility];  Service: Endoscopy;  Laterality: N/A;  10:30 AM   lipoma removal     TEE WITHOUT CARDIOVERSION N/A 09/18/2020   Procedure: TRANSESOPHAGEAL ECHOCARDIOGRAM (TEE);  Surgeon: Pixie Casino, MD;  Location: [facility] ENDOSCOPY;  Service: Cardiovascular;  Laterality: N/A;    There were no vitals filed for this visit.   Subjective Assessment - 11/16/20 1229     Subjective Pt reports taking a few steps at home in front of the counter. Pt states he no longer wears AFO since home from rehab.    Currently in Pain? No/denies            Neuro-Reed: Sidestepping at  elevated mat table with bil hands on table, mod A to advance RLE laterally, multimodal cues to shift weight R and extend R knee when accepting weight. Static standing, cued for hip width stance, reaching with L hand to retrieve cone and reach across trunk to R side at varying elevations to place cone down, min guard and no UE support. Forward/retro ambulation in parallel bars, mod A to advance R leg forward, min A to advance with retro steps, therapist providing multimodal cues to encourage wide BOS and avoid LE touching each other. STS in front of mirror from w/c, visual feedback and multimodal cues to encourage equal weight-bearing and shifting hips-shoulders to R with rising and lowering.     PT Education - 11/16/20 1230     Education Details Exercise technique. Bring AFO and RW next session to ambulate. Provided mirror for visual feedback and improve normal postural alignment. Benefit/purpose of AFO due to lack of DF.    Person(s) Educated Patient;Spouse    Methods Explanation;Demonstration;Tactile cues;Verbal cues    Comprehension Verbalized understanding;Returned demonstration;Verbal cues required;Tactile cues required              PT Short Term Goals - 11/02/20 1127       PT SHORT TERM GOAL #1  Title Patient will be independent with initial HEP and self-management strategies to improve functional outcomes    Time 3    Period Weeks    Status On-going    Target Date 11/13/20               PT Long Term Goals - 11/02/20 1127       PT LONG TERM GOAL #1   Title Patient will be able to ambualte 150 feet using LRAD for improved funcitonal and household mobililty    Time 6    Period Weeks    Status On-going      PT LONG TERM GOAL #2   Title Patient will be able to perform bed mobility and functional transfers mod I    Time 8    Period Weeks    Status On-going                   Plan - 11/16/20 1232     Clinical Impression Statement Pt hesitant to take steps  with therapist, required education and encouragement. Pt demonstrates L LE centered under trunk in static standing avoiding RLE weight-bearing, difficulty improving with mirror visual feedback but improved awareness. Pt demonstrates L weight-shift with STS reps despite visual feedback and verbal/tactile cues. Pt takes forward/retro steps in parallel bars, requiring mod A to lift R leg, clear foot and position out of adducted position. Encouraged R weight-shift with hesitancy but able to with repeated reps and visual feedback. Educated pt on use of R AFO and platform RW, agreeable to bring to next session and attempt. Continue to progress as able.    Comorbidities multiple CVAs    Examination-Activity Limitations Transfers;Locomotion Level    Examination-Participation Restrictions Community Activity;Driving;Cleaning    Stability/Clinical Decision Making Stable/Uncomplicated    Rehab Potential Fair    PT Frequency 2x / week    PT Duration 8 weeks    PT Treatment/Interventions ADLs/Self Care Home Management;Aquatic Therapy;DME Instruction;Gait training;Stair training;Functional mobility training;Therapeutic activities;Therapeutic exercise;Balance training;Neuromuscular re-education;Patient/family education;Orthotic Fit/Training;Passive range of motion;Wheelchair mobility training;Visual/perceptual remediation/compensation;Energy conservation;Spinal Manipulations;Splinting;Joint Manipulations;Taping;Vasopneumatic Device;Compression bandaging;Manual techniques;Dry needling;Cognitive remediation;Electrical Stimulation;Cryotherapy;Biofeedback;Moist Heat;Traction;Iontophoresis 4mg /ml Dexamethasone    PT Next Visit Plan Gait/pre-gait train with AFO and platform RW. Progress funcitonal activity as tolerated, begin on mat and progress to standing> balance> gait    PT Home Exercise Plan Eval: seated march, LAQs with assist as needed 9/19 assisted heelslides with iso holds to fatigue    Consulted and Agree with Plan  of Care Patient;Family member/caregiver             Patient will benefit from skilled therapeutic intervention in order to improve the following deficits and impairments:  Abnormal gait, Difficulty walking, Decreased endurance, Decreased activity tolerance, Decreased balance, Decreased mobility, Decreased strength, Postural dysfunction  Visit Diagnosis: Muscle weakness (generalized)  Other abnormalities of gait and mobility  Difficulty in walking, not elsewhere classified     Problem List Patient Active Problem List   Diagnosis Date Noted   Dysphagia, post-stroke    Essential hypertension    Diabetic peripheral neuropathy (HCC)    Infarction of left basal ganglia ([place]) 09/19/2020   Acute CVA (cerebrovascular accident) ([place]) 09/06/2020   Stroke ([place]) 11/23/2018   Numbness 11/23/2018   CVA (cerebral vascular accident) ([place]) 11/23/2018   Lung nodule 11/17/2017   Chest pressure 11/22/2016   Shortness of breath 11/22/2016   Depression, major, single episode, moderate ([place]) 10/14/2016   Special screening for malignant neoplasms, colon    Chronic obstructive pulmonary  disease ([place]) 06/08/2016   Hypertensive emergency 02/16/2016   Hypertensive urgency 06/07/2015   CKD (chronic kidney disease), stage III ([place]) 06/07/2015   Pain in the chest    Elevated troponin    Chronic diastolic CHF (congestive heart failure) ([place]) 09/17/2013   DM type 2 (diabetes mellitus, type 2) ([place]) 09/15/2013   Chest pain 08/01/2011   HTN (hypertension) 08/01/2011   Chest pain 07/22/2011   Renal insufficiency 07/22/2011   Tobacco abuse 07/22/2011   Hypertension      Talbot Grumbling PT, DPT 11/16/20, 12:47 PM    [place] [address] [place], [place], [address] Phone: [phone]   Fax:  [phone]  Name: Craig Knight MRN: [id number] Date of Birth: 05-Nov-1965

## 2020-11-20 ENCOUNTER — Encounter (HOSPITAL_COMMUNITY): Payer: Self-pay | Admitting: Physical Therapy

## 2020-11-20 ENCOUNTER — Other Ambulatory Visit: Payer: Self-pay

## 2020-11-20 ENCOUNTER — Encounter (HOSPITAL_COMMUNITY): Payer: Self-pay | Admitting: Occupational Therapy

## 2020-11-20 ENCOUNTER — Ambulatory Visit (HOSPITAL_COMMUNITY): Payer: Medicare Other | Admitting: Occupational Therapy

## 2020-11-20 ENCOUNTER — Ambulatory Visit (HOSPITAL_COMMUNITY): Payer: Medicare Other | Admitting: Physical Therapy

## 2020-11-20 DIAGNOSIS — M6281 Muscle weakness (generalized): Secondary | ICD-10-CM

## 2020-11-20 DIAGNOSIS — R2689 Other abnormalities of gait and mobility: Secondary | ICD-10-CM

## 2020-11-20 DIAGNOSIS — R262 Difficulty in walking, not elsewhere classified: Secondary | ICD-10-CM

## 2020-11-20 DIAGNOSIS — R29818 Other symptoms and signs involving the nervous system: Secondary | ICD-10-CM

## 2020-11-20 DIAGNOSIS — R278 Other lack of coordination: Secondary | ICD-10-CM

## 2020-11-20 NOTE — Therapy (Addendum)
[place], [place], [address] Phone: [phone]   Fax:  [phone]  Occupational Therapy Treatment  Patient Details  Name: Craig Knight MRN: [id number] Date of Birth: 04-Dec-1965 Referring Provider (OT): Lauraine Rinne, PA-C   Encounter Date: 11/20/2020   OT End of Session - 11/20/20 1207     Visit Number 8    Number of Visits 8    Date for OT Re-Evaluation 11/24/20    Authorization Type UHC Medicare; $30 copay    Progress Note Due on Visit 10    OT Start Time 1109    OT Stop Time 1153    OT Time Calculation (min) 44 min    Activity Tolerance Patient tolerated treatment well    Behavior During Therapy [facility] for tasks assessed/performed             Past Medical History:  Diagnosis Date   Asthma    Bronchitis    COPD (chronic obstructive pulmonary disease) ([place])    Diabetes mellitus without complication ([place])    Heart murmur 47/10/6281   soft systolic murmur 1/6   Hypertension    Sleep apnea    Stroke [facility])     Past Surgical History:  Procedure Laterality Date   BUBBLE STUDY  09/18/2020   Procedure: BUBBLE STUDY;  Surgeon: Pixie Casino, MD;  Location: [place];  Service: Cardiovascular;;   COLONOSCOPY N/A 09/18/2016   Procedure: COLONOSCOPY;  Surgeon: Danie Binder, MD;  [facility];  Service: Endoscopy;  Laterality: N/A;  10:30 AM   lipoma removal     TEE WITHOUT CARDIOVERSION N/A 09/18/2020   Procedure: TRANSESOPHAGEAL ECHOCARDIOGRAM (TEE);  Surgeon: Pixie Casino, MD;  Location: [facility] ENDOSCOPY;  Service: Cardiovascular;  Laterality: N/A;    There were no vitals filed for this visit.   Subjective Assessment - 11/20/20 1110     Subjective  S: I've been sleeping good.    Patient is accompanied by: Family member   wife   Currently in Pain? Yes    Pain Score 4     Pain Location Shoulder    Pain Orientation Right    Pain Descriptors / Indicators Aching    Pain Type  Acute pain    Pain Radiating Towards N/A    Pain Onset Yesterday    Pain Frequency Constant    Aggravating Factors  laying down    Pain Relieving Factors unsure    Effect of Pain on Daily Activities None    Multiple Pain Sites No                [facility] OT Assessment - 11/20/20 1109       Assessment   Medical Diagnosis s/p CVA      Precautions   Precautions Fall;Other (comment)    Precaution Comments right side hemiparesis                      OT Treatments/Exercises (OP) - 11/20/20 0001       Neurological Re-education Exercises   Other Information scapular elevation/protraction/retraction with OT providing max support at scapula. 10 reps    Shoulder Flexion PROM;5 reps;Seated    Shoulder ABduction PROM;5 reps;Seated    Shoulder External Rotation PROM;5 reps;Seated    Shoulder Internal Rotation PROM;5 reps;Seated    Elbow Flexion PROM;10 reps;Seated    Elbow Extension PROM;10 reps;Seated    Wrist Extension PROM;10 reps;Seated    Other Exercises  1 Towel slides-left hand stabilizing right hand, 10X    Weight Bearing Position Standing    Standing with weight shifting on and off Pt standing at adjustable table in ADL room with hand propped on towel roll.  Pt completing grooved pegboard weightshifting on/off to place pegs. OT providing support at elbow to prevent buckling. Pt completed pegboard with 2 seated rest breaks.      Manual Therapy   Manual Therapy Taping    Manual therapy comments Kinesiotape applied to right shoulder for [facility] mechanical correction.                      OT Short Term Goals - 10/31/20 1655       OT SHORT TERM GOAL #1   Title Pt and caregiver will be educated on HEP to improve independence in ADL completion.    Time 4    Period Weeks    Status On-going    Target Date 11/25/20      OT SHORT TERM GOAL #2   Title Pt will demonstrate correct completion of weightbearing techniques to support nerve reinervation and decrease  shoulder subluxation.    Time 4    Period Weeks    Status On-going      OT SHORT TERM GOAL #3   Title Pt will be educated on and demonstrate proficiency in completion of adaptive strategies and techniques to improve independence in ADLs such as dressing, bathing, and simple meal preparation tasks.    Time 4    Period Weeks    Status On-going      OT SHORT TERM GOAL #4   Title Pt will increase P/ROM to [facility] to improve ability to complete RUE stretching tasks and decrease pain with functional tasks.    Time 4    Period Weeks    Status On-going      OT SHORT TERM GOAL #5   Title Pt and caregiver will demonstrate understanding of orthotics wear and donning/doffing such as sling and/or splinting to support RUE during ADLs and mobility tasks.    Time 4    Period Weeks    Status On-going                    Plan - 11/20/20 1208     Clinical Impression Statement A: Pt reporting tape came off and his shoulder has been hurting. Requested re-application of kinisiotape, completed at beginning of session and pt reporting immediate improvement in discomfort. Continued with passive stretching and weightbearing/weight shifting today. Pt with good tolerance for activities, added scapular mobilizations with trace muscle activation noted. Educated on importance of completing HEP for passive stretching and weightbearing daily.    Body Structure / Function / Physical Skills ADL;Dexterity;Flexibility;ROM;Strength;Vision;Balance;Coordination;Edema;FMC;IADL;Tone;UE functional use;Sensation;Endurance;Decreased knowledge of precautions;Body mechanics;Decreased knowledge of use of DME;GMC;Mobility;Proprioception;Pain    Plan P: Reassessment    OT Home Exercise Plan eval: self-ROM for elbow flexion/extension, forearm supination/pronation 9/6: self ROM - shoulder, weightbearing into forearm    Consulted and Agree with Plan of Care Patient    Family Member Consulted wife [name]             Patient  will benefit from skilled therapeutic intervention in order to improve the following deficits and impairments:   Body Structure / Function / Physical Skills: ADL, Dexterity, Flexibility, ROM, Strength, Vision, Balance, Coordination, Edema, FMC, IADL, Tone, UE functional use, Sensation, Endurance, Decreased knowledge of precautions, Body mechanics, Decreased knowledge of use of DME, GMC,  Mobility, Proprioception, Pain       Visit Diagnosis: Other lack of coordination  Other symptoms and signs involving the nervous system    Problem List Patient Active Problem List   Diagnosis Date Noted   Dysphagia, post-stroke    Essential hypertension    Diabetic peripheral neuropathy (HCC)    Infarction of left basal ganglia ([place]) 09/19/2020   Acute CVA (cerebrovascular accident) ([place]) 09/06/2020   Stroke ([place]) 11/23/2018   Numbness 11/23/2018   CVA (cerebral vascular accident) ([place]) 11/23/2018   Lung nodule 11/17/2017   Chest pressure 11/22/2016   Shortness of breath 11/22/2016   Depression, major, single episode, moderate ([place]) 10/14/2016   Special screening for malignant neoplasms, colon    Chronic obstructive pulmonary disease ([place]) 06/08/2016   Hypertensive emergency 02/16/2016   Hypertensive urgency 06/07/2015   CKD (chronic kidney disease), stage III ([place]) 06/07/2015   Pain in the chest    Elevated troponin    Chronic diastolic CHF (congestive heart failure) ([place]) 09/17/2013   DM type 2 (diabetes mellitus, type 2) ([place]) 09/15/2013   Chest pain 08/01/2011   HTN (hypertension) 08/01/2011   Chest pain 07/22/2011   Renal insufficiency 07/22/2011   Tobacco abuse 07/22/2011   Hypertension     Guadelupe Sabin, OTR/L  [phone] 11/20/2020, 12:12 PM  [place] [address] [place], [place], [address] Phone: [phone]   Fax:  [phone]  Name: Craig Knight MRN: [id number] Date of Birth: 1965/12/15

## 2020-11-21 NOTE — Therapy (Signed)
[place], [place], [address] Phone: [phone]   Fax:  [phone]  Physical Therapy Treatment  Patient Details  Name: Craig Knight MRN: [id number] Date of Birth: Apr 24, 1965 Referring Provider (PT): Alysia Penna MD   Encounter Date: 11/20/2020    11/20/20 1024  Symptoms/Limitations  Subjective Has been working on home exercises. Feels RT side is coming along but still weak.  Pain Assessment  Currently in Pain? Yes  Pain Score 5  Pain Location Shoulder  Pain Orientation Right  Pain Descriptors / Indicators Aching    Past Medical History:  Diagnosis Date   Asthma    Bronchitis    COPD (chronic obstructive pulmonary disease) (HCC)    Diabetes mellitus without complication (HCC)    Heart murmur 71/24/5809   soft systolic murmur 1/6   Hypertension    Sleep apnea    Stroke [facility])     Past Surgical History:  Procedure Laterality Date   BUBBLE STUDY  09/18/2020   Procedure: BUBBLE STUDY;  Surgeon: Pixie Casino, MD;  Location: [place];  Service: Cardiovascular;;   COLONOSCOPY N/A 09/18/2016   Procedure: COLONOSCOPY;  Surgeon: Danie Binder, MD;  [facility];  Service: Endoscopy;  Laterality: N/A;  10:30 AM   lipoma removal     TEE WITHOUT CARDIOVERSION N/A 09/18/2020   Procedure: TRANSESOPHAGEAL ECHOCARDIOGRAM (TEE);  Surgeon: Pixie Casino, MD;  Location: [facility] ENDOSCOPY;  Service: Cardiovascular;  Laterality: N/A;    There were no vitals filed for this visit.         [place] Adult PT Treatment/Exercise - 11/21/20 0001       Knee/Hip Exercises: Seated   Sit to Sand 2 sets;5 reps;with UE support   verbal and tactile cues for even weight shift     Knee/Hip Exercises: Supine   Other Supine Knee/Hip Exercises assisted heel slides with 10 sec holds x 10, bridge with 5 sec holds (assist for RLE placement) 2 x 10, assisted RT hip abduction with active adduction 2 x 10                        PT Short Term Goals - 11/02/20 1127       PT SHORT TERM GOAL #1   Title Patient will be independent with initial HEP and self-management strategies to improve functional outcomes    Time 3    Period Weeks    Status On-going    Target Date 11/13/20               PT Long Term Goals - 11/02/20 1127       PT LONG TERM GOAL #1   Title Patient will be able to ambualte 150 feet using LRAD for improved funcitonal and household mobililty    Time 6    Period Weeks    Status On-going      PT LONG TERM GOAL #2   Title Patient will be able to perform bed mobility and functional transfers mod I    Time 8    Period Weeks    Status On-going                   Plan - 11/21/20 1911     Clinical Impression Statement Patient continues to demo challenge with RLE AROM against gravity. Continues to have difficulty with side lying bed mobility due to RT arm pain. Requires active assist with all  mat exercise, but shows some ability to hold isometric contraction of adductors and hamstrings. Able to perform bridging with active assist for RLE placement. Requires verbal cues for even weight shifting during sit to stands to encourage WB through RLE. Remains non-ambulatory. Will continue to benefit from strength progressions in order to allow for trial of pre gait activity in parallel bars.    Comorbidities multiple CVAs    Examination-Activity Limitations Transfers;Locomotion Level    Examination-Participation Restrictions Community Activity;Driving;Cleaning    Stability/Clinical Decision Making Stable/Uncomplicated    Rehab Potential Fair    PT Frequency 2x / week    PT Duration 8 weeks    PT Treatment/Interventions ADLs/Self Care Home Management;Aquatic Therapy;DME Instruction;Gait training;Stair training;Functional mobility training;Therapeutic activities;Therapeutic exercise;Balance training;Neuromuscular re-education;Patient/family education;Orthotic  Fit/Training;Passive range of motion;Wheelchair mobility training;Visual/perceptual remediation/compensation;Energy conservation;Spinal Manipulations;Splinting;Joint Manipulations;Taping;Vasopneumatic Device;Compression bandaging;Manual techniques;Dry needling;Cognitive remediation;Electrical Stimulation;Cryotherapy;Biofeedback;Moist Heat;Traction;Iontophoresis 4mg /ml Dexamethasone    PT Next Visit Plan Gait/pre-gait train with AFO and platform RW. Progress funcitonal activity as tolerated, begin on mat and progress to standing> balance> gait    PT Home Exercise Plan Eval: seated march, LAQs with assist as needed 9/19 assisted heelslides with iso holds to fatigue    Consulted and Agree with Plan of Care Patient;Family member/caregiver             Patient will benefit from skilled therapeutic intervention in order to improve the following deficits and impairments:  Abnormal gait, Difficulty walking, Decreased endurance, Decreased activity tolerance, Decreased balance, Decreased mobility, Decreased strength, Postural dysfunction  Visit Diagnosis: Muscle weakness (generalized)  Other abnormalities of gait and mobility  Difficulty in walking, not elsewhere classified     Problem List Patient Active Problem List   Diagnosis Date Noted   Dysphagia, post-stroke    Essential hypertension    Diabetic peripheral neuropathy (HCC)    Infarction of left basal ganglia ([place]) 09/19/2020   Acute CVA (cerebrovascular accident) ([place]) 09/06/2020   Stroke ([place]) 11/23/2018   Numbness 11/23/2018   CVA (cerebral vascular accident) ([place]) 11/23/2018   Lung nodule 11/17/2017   Chest pressure 11/22/2016   Shortness of breath 11/22/2016   Depression, major, single episode, moderate ([place]) 10/14/2016   Special screening for malignant neoplasms, colon    Chronic obstructive pulmonary disease ([place]) 06/08/2016   Hypertensive emergency 02/16/2016   Hypertensive urgency 06/07/2015   CKD (chronic kidney  disease), stage III ([place]) 06/07/2015   Pain in the chest    Elevated troponin    Chronic diastolic CHF (congestive heart failure) ([place]) 09/17/2013   DM type 2 (diabetes mellitus, type 2) ([place]) 09/15/2013   Chest pain 08/01/2011   HTN (hypertension) 08/01/2011   Chest pain 07/22/2011   Renal insufficiency 07/22/2011   Tobacco abuse 07/22/2011   Hypertension    7:16 PM, 11/21/20 Josue Hector PT DPT  Physical Therapist with [facility]  [facility]  [phone] [place] [address] [place], [place], [address] Phone: [phone]   Fax:  [phone]  Name: NHAN QUALLEY MRN: [id number] Date of Birth: 16-Mar-1965

## 2020-11-21 NOTE — Progress Notes (Signed)
   11/20/20 1024  Symptoms/Limitations  Subjective Has been working on home exercises. Feels RT side is coming along but still weak.  Pain Assessment  Currently in Pain? Yes  Pain Score 5  Pain Location Shoulder  Pain Orientation Right  Pain Descriptors / Indicators Aching

## 2020-11-22 ENCOUNTER — Ambulatory Visit (HOSPITAL_COMMUNITY): Payer: Medicare Other | Admitting: Speech Pathology

## 2020-11-22 ENCOUNTER — Ambulatory Visit (HOSPITAL_COMMUNITY): Payer: Medicare Other | Admitting: Physical Therapy

## 2020-11-22 ENCOUNTER — Other Ambulatory Visit: Payer: Self-pay

## 2020-11-22 ENCOUNTER — Encounter (HOSPITAL_COMMUNITY): Payer: Self-pay | Admitting: Physical Therapy

## 2020-11-22 ENCOUNTER — Ambulatory Visit (HOSPITAL_COMMUNITY): Payer: Medicare Other

## 2020-11-22 ENCOUNTER — Encounter (HOSPITAL_COMMUNITY): Payer: Self-pay

## 2020-11-22 DIAGNOSIS — R278 Other lack of coordination: Secondary | ICD-10-CM

## 2020-11-22 DIAGNOSIS — R2689 Other abnormalities of gait and mobility: Secondary | ICD-10-CM | POA: Diagnosis not present

## 2020-11-22 DIAGNOSIS — M6281 Muscle weakness (generalized): Secondary | ICD-10-CM

## 2020-11-22 DIAGNOSIS — R262 Difficulty in walking, not elsewhere classified: Secondary | ICD-10-CM

## 2020-11-22 DIAGNOSIS — R29818 Other symptoms and signs involving the nervous system: Secondary | ICD-10-CM

## 2020-11-22 NOTE — Therapy (Signed)
[place], [place], [address] Phone: [phone]   Fax:  [phone]  Physical Therapy Treatment  Patient Details  Name: Craig Knight MRN: [id number] Date of Birth: 06-05-1965 Referring Provider (PT): Alysia Penna MD   Encounter Date: 11/22/2020   PT End of Session - 11/22/20 0952     Visit Number 9    Number of Visits 16    Date for PT Re-Evaluation 12/18/20    Authorization Type UHC Medicare    Progress Note Due on Visit 10    PT Start Time 0955    PT Stop Time 1035    PT Time Calculation (min) 40 min    Equipment Utilized During Treatment Gait belt    Activity Tolerance Patient tolerated treatment well    Behavior During Therapy [facility] for tasks assessed/performed             Past Medical History:  Diagnosis Date   Asthma    Bronchitis    COPD (chronic obstructive pulmonary disease) ([place])    Diabetes mellitus without complication ([place])    Heart murmur 93/23/5573   soft systolic murmur 1/6   Hypertension    Sleep apnea    Stroke [facility])     Past Surgical History:  Procedure Laterality Date   BUBBLE STUDY  09/18/2020   Procedure: BUBBLE STUDY;  Surgeon: Pixie Casino, MD;  Location: [place];  Service: Cardiovascular;;   COLONOSCOPY N/A 09/18/2016   Procedure: COLONOSCOPY;  Surgeon: Danie Binder, MD;  [facility];  Service: Endoscopy;  Laterality: N/A;  10:30 AM   lipoma removal     TEE WITHOUT CARDIOVERSION N/A 09/18/2020   Procedure: TRANSESOPHAGEAL ECHOCARDIOGRAM (TEE);  Surgeon: Pixie Casino, MD;  Location: [facility] ENDOSCOPY;  Service: Cardiovascular;  Laterality: N/A;    There were no vitals filed for this visit.   Subjective Assessment - 11/22/20 0954     Subjective His home exercises are going alright. He has been using sink for transfers and to hold on to standing.    Currently in Pain? Yes    Pain Score 5     Pain Location Arm    Pain Orientation Right     Pain Descriptors / Indicators Aching                               [facility] Adult PT Treatment/Exercise - 11/22/20 0001       Ambulation/Gait   Ambulation/Gait Yes    Ambulation/Gait Assistance 4: Min guard;4: Min assist    Ambulation Distance (Feet) 110 Feet    Assistive device Hemi-walker    Gait Pattern Decreased hip/knee flexion - right;Decreased dorsiflexion - right;Decreased weight shift to right;Right flexed knee in stance;Shuffle;Lateral trunk lean to left;Abducted- right;Poor foot clearance - right    Gait Comments assist for RLE movement intermittently, 2 x 40 feet 1x 30      Knee/Hip Exercises: Standing   Other Standing Knee Exercises lateral weight shifting x 20 bilateral                     PT Education - 11/22/20 0954     Education Details HEP, hemi walker, traction strips for ramp    Person(s) Educated Patient;Spouse    Methods Explanation;Demonstration;Handout    Comprehension Verbalized understanding;Returned demonstration              PT Short  Term Goals - 11/02/20 1127       PT SHORT TERM GOAL #1   Title Patient will be independent with initial HEP and self-management strategies to improve functional outcomes    Time 3    Period Weeks    Status On-going    Target Date 11/13/20               PT Long Term Goals - 11/02/20 1127       PT LONG TERM GOAL #1   Title Patient will be able to ambualte 150 feet using LRAD for improved funcitonal and household mobililty    Time 6    Period Weeks    Status On-going      PT LONG TERM GOAL #2   Title Patient will be able to perform bed mobility and functional transfers mod I    Time 8    Period Weeks    Status On-going                   Plan - 11/22/20 0953     Clinical Impression Statement Patient able to ambulate with hemi walker with assist for RLE movement initially but patient able to advance with poor foot clearance and increased time. Attempted  standing exercises but patient without RLE control limiting ability to complete. Completed weight shifting for improving weight acceptance on RLE. Educated on safety with home ambulation if he attempts with wife. Patient will continue to benefit from skilled physical therapy in order to reduce impairment and improve function.    Comorbidities multiple CVAs    Examination-Activity Limitations Transfers;Locomotion Level    Examination-Participation Restrictions Community Activity;Driving;Cleaning    Stability/Clinical Decision Making Stable/Uncomplicated    Rehab Potential Fair    PT Frequency 2x / week    PT Duration 8 weeks    PT Treatment/Interventions ADLs/Self Care Home Management;Aquatic Therapy;DME Instruction;Gait training;Stair training;Functional mobility training;Therapeutic activities;Therapeutic exercise;Balance training;Neuromuscular re-education;Patient/family education;Orthotic Fit/Training;Passive range of motion;Wheelchair mobility training;Visual/perceptual remediation/compensation;Energy conservation;Spinal Manipulations;Splinting;Joint Manipulations;Taping;Vasopneumatic Device;Compression bandaging;Manual techniques;Dry needling;Cognitive remediation;Electrical Stimulation;Cryotherapy;Biofeedback;Moist Heat;Traction;Iontophoresis 4mg /ml Dexamethasone    PT Next Visit Plan ambulation with hemi walker. Progress funcitonal activity as tolerated, begin on mat and progress to standing> balance> gait    PT Home Exercise Plan Eval: seated march, LAQs with assist as needed 9/19 assisted heelslides with iso holds to fatigue    Consulted and Agree with Plan of Care Patient;Family member/caregiver             Patient will benefit from skilled therapeutic intervention in order to improve the following deficits and impairments:  Abnormal gait, Difficulty walking, Decreased endurance, Decreased activity tolerance, Decreased balance, Decreased mobility, Decreased strength, Postural  dysfunction  Visit Diagnosis: Other abnormalities of gait and mobility  Difficulty in walking, not elsewhere classified  Muscle weakness (generalized)     Problem List Patient Active Problem List   Diagnosis Date Noted   Dysphagia, post-stroke    Essential hypertension    Diabetic peripheral neuropathy (HCC)    Infarction of left basal ganglia ([place]) 09/19/2020   Acute CVA (cerebrovascular accident) ([place]) 09/06/2020   Stroke ([place]) 11/23/2018   Numbness 11/23/2018   CVA (cerebral vascular accident) ([place]) 11/23/2018   Lung nodule 11/17/2017   Chest pressure 11/22/2016   Shortness of breath 11/22/2016   Depression, major, single episode, moderate ([place]) 10/14/2016   Special screening for malignant neoplasms, colon    Chronic obstructive pulmonary disease ([place]) 06/08/2016   Hypertensive emergency 02/16/2016   Hypertensive urgency 06/07/2015   CKD (  chronic kidney disease), stage III ([place]) 06/07/2015   Pain in the chest    Elevated troponin    Chronic diastolic CHF (congestive heart failure) ([place]) 09/17/2013   DM type 2 (diabetes mellitus, type 2) ([place]) 09/15/2013   Chest pain 08/01/2011   HTN (hypertension) 08/01/2011   Chest pain 07/22/2011   Renal insufficiency 07/22/2011   Tobacco abuse 07/22/2011   Hypertension     10:38 AM, 11/22/20 Mearl Latin PT, DPT Physical Therapist at [place], [place], [address] Phone: [phone]   Fax:  [phone]  Name: DAYMEON FISCHMAN MRN: [id number] Date of Birth: [birth year]

## 2020-11-22 NOTE — Therapy (Signed)
[place], [place], [address] Phone: [phone]   Fax:  [phone]  Occupational Therapy Treatment  Patient Details  Name: Craig Knight MRN: [id number] Date of Birth: 06-14-1965 Referring Provider (OT): Lauraine Rinne, PA-C  Progress Note Reporting Period 10/25/20 to 11/22/20  See note below for Objective Data and Assessment of Progress/Goals.      Encounter Date: 11/22/2020   OT End of Session - 11/22/20 1006     Visit Number 9    Number of Visits 17    Date for OT Re-Evaluation 12/20/20    Authorization Type UHC Medicare; $30 copay    Progress Note Due on Visit 49    OT Start Time 2563   reassess/re-cert   OT Stop Time 8937    OT Time Calculation (min) 35 min    Activity Tolerance Patient tolerated treatment well    Behavior During Therapy [facility] for tasks assessed/performed             Past Medical History:  Diagnosis Date   Asthma    Bronchitis    COPD (chronic obstructive pulmonary disease) ([place])    Diabetes mellitus without complication ([place])    Heart murmur 34/28/7681   soft systolic murmur 1/6   Hypertension    Sleep apnea    Stroke [facility])     Past Surgical History:  Procedure Laterality Date   BUBBLE STUDY  09/18/2020   Procedure: BUBBLE STUDY;  Surgeon: Pixie Casino, MD;  Location: [place];  Service: Cardiovascular;;   COLONOSCOPY N/A 09/18/2016   Procedure: COLONOSCOPY;  Surgeon: Danie Binder, MD;  [facility];  Service: Endoscopy;  Laterality: N/A;  10:30 AM   lipoma removal     TEE WITHOUT CARDIOVERSION N/A 09/18/2020   Procedure: TRANSESOPHAGEAL ECHOCARDIOGRAM (TEE);  Surgeon: Pixie Casino, MD;  Location: [facility] ENDOSCOPY;  Service: Cardiovascular;  Laterality: N/A;    There were no vitals filed for this visit.   Subjective Assessment - 11/22/20 0959     Subjective  S: I need this arm to get moving.    Patient is accompanied by: Family member   Wife    Currently in Pain? No/denies                [facility] OT Assessment - 11/22/20 0912       Assessment   Medical Diagnosis s/p CVA      Precautions   Precautions Fall;Other (comment)    Precaution Comments right side hemiparesis      Prior Function   Level of Independence Independent      ADL   Upper Body Bathing Minimal assistance   Assist with left arm.   Lower Body Bathing Modified independent    Upper Body Dressing --   Modified independent previous: set-up   Lower Body Dressing Minimal assistance   previous: moderate assist     IADL   Meal Prep --   Able to prepare simple meal of cereal     PROM   Overall PROM Comments RUE P/ROM is 50% flexion and abduction, er. Elbow and wrist ROM is WFL; digit ROM is WFL   Previous: same     Strength   Overall Strength Comments Possible 1/5 muscle activation felt with seated shoulder flexion elbow positioned at 90 degrees flexion. At eval: RUE was 0/5 in all joints (shoulder, elbow, wrist, hand).  OT Education - 11/22/20 1004     Education Details reviewed therapy goals, HEP. Discussed weightbearing techniques to complete at home. Seated weightbearing into forearm.    Person(s) Educated Spouse;Patient    Methods Explanation;Demonstration    Comprehension Verbalized understanding;Returned demonstration              OT Short Term Goals - 11/22/20 0909       OT SHORT TERM GOAL #1   Title Pt and caregiver will be educated on HEP to improve independence in ADL completion.    Time 4    Period Weeks    Status Achieved    Target Date 11/25/20      OT SHORT TERM GOAL #2   Title Pt will demonstrate correct completion of weightbearing techniques to support nerve reinervation and decrease shoulder subluxation.    Time 4    Period Weeks    Status On-going      OT SHORT TERM GOAL #3   Title Pt will be educated on and demonstrate proficiency in completion of adaptive strategies  and techniques to improve independence in ADLs such as dressing, bathing, and simple meal preparation tasks.    Time 4    Period Weeks    Status Achieved      OT SHORT TERM GOAL #4   Title Pt will increase P/ROM to [facility] to improve ability to complete RUE stretching tasks and decrease pain with functional tasks.    Time 4    Period Weeks    Status On-going      OT SHORT TERM GOAL #5   Title Pt and caregiver will demonstrate understanding of orthotics wear and donning/doffing such as sling and/or splinting to support RUE during ADLs and mobility tasks.    Time 4    Period Weeks    Status On-going      Additional Short Term Goals   Additional Short Term Goals Yes      OT SHORT TERM GOAL #6   Title Patient will present with 1/5 right UE shoulder for flexion, extension, and scapular elevation which will allow patient to begin using his RUE as stabilizer during self care tasks as able.    Time 4    Period Weeks    Status New    Target Date 12/20/20                  Plan - 11/22/20 1007     Clinical Impression Statement A: Reassessment completed this date. Patient continues to present with 1-2 finger subluxation of the right shoulder joint. Kinsiotape application has been used to help with joint stability and pain management. Pt continues to tolerate passive ROM of the right shoulder to approximately 90 degrees (flexion, er, abduction) before pain and muscle tightness is felt. Pt is now able to complete Upper body dressing with Modified independence. He only requires assist from his wife to put his right foot into the pant leg and assistance to tie his shoes. He is able to complete the remainder of the dressing. He has 2/5 therapy goals. 1 additional goal has been made to address muscle activation in right shoulder. Recommended continuting skilled OT services 2x a week for 4 more weeks.    Body Structure / Function / Physical Skills  ADL;Dexterity;Flexibility;ROM;Strength;Vision;Balance;Coordination;Edema;FMC;IADL;Tone;UE functional use;Sensation;Endurance;Decreased knowledge of precautions;Body mechanics;Decreased knowledge of use of DME;GMC;Mobility;Proprioception;Pain    OT Frequency 2x / week    OT Duration 4 weeks    Plan P: continue skilled OT services  focusing on remainder of goals while increasing RUE muscle activation which will allow patient to complete ADL tasks with increased independence. Next session: interlace fingers of bilateral hands and work on picking up items (ie. cup, cones) using both hands/upper extremities.             Patient will benefit from skilled therapeutic intervention in order to improve the following deficits and impairments:   Body Structure / Function / Physical Skills: ADL, Dexterity, Flexibility, ROM, Strength, Vision, Balance, Coordination, Edema, FMC, IADL, Tone, UE functional use, Sensation, Endurance, Decreased knowledge of precautions, Body mechanics, Decreased knowledge of use of DME, GMC, Mobility, Proprioception, Pain       Visit Diagnosis: Other lack of coordination - Plan: Ot plan of care cert/re-cert  Other symptoms and signs involving the nervous system - Plan: Ot plan of care cert/re-cert    Problem List Patient Active Problem List   Diagnosis Date Noted   Dysphagia, post-stroke    Essential hypertension    Diabetic peripheral neuropathy ([place])    Infarction of left basal ganglia ([place]) 09/19/2020   Acute CVA (cerebrovascular accident) ([place]) 09/06/2020   Stroke ([place]) 11/23/2018   Numbness 11/23/2018   CVA (cerebral vascular accident) ([place]) 11/23/2018   Lung nodule 11/17/2017   Chest pressure 11/22/2016   Shortness of breath 11/22/2016   Depression, major, single episode, moderate ([place]) 10/14/2016   Special screening for malignant neoplasms, colon    Chronic obstructive pulmonary disease ([place]) 06/08/2016   Hypertensive emergency 02/16/2016   Hypertensive  urgency 06/07/2015   CKD (chronic kidney disease), stage III ([place]) 06/07/2015   Pain in the chest    Elevated troponin    Chronic diastolic CHF (congestive heart failure) ([place]) 09/17/2013   DM type 2 (diabetes mellitus, type 2) ([place]) 09/15/2013   Chest pain 08/01/2011   HTN (hypertension) 08/01/2011   Chest pain 07/22/2011   Renal insufficiency 07/22/2011   Tobacco abuse 07/22/2011   Hypertension    Ailene Ravel, OTR/L,CBIS  [phone]  11/22/2020, 10:33 AM  [place] [address] [place], [place], [address] Phone: [phone]   Fax:  [phone]  Name: Craig Knight MRN: [id number] Date of Birth: 1965/06/15

## 2020-11-23 NOTE — Progress Notes (Deleted)
Cardiology Office Note:    Date:  11/23/2020  NAME:  Craig Knight    MRN: [id number] DOB:  01-28-1966   PCP:  Rosita Fire, MD  Cardiologist:  Pixie Casino, MD  Electrophysiologist:  None   Referring MD: Margette Fast, MD   No chief complaint on file. ***  History of Present Illness:    Craig Knight is a 55 y.o. male with a hx of *** who is being seen today for the evaluation of *** at the request of ***.  Problem List CVA -L basal ganglia stroke/R PCA stroke 08/2020 -diffuse intracranial atherosclerosis  2. HTN 3. DM -A1c 7.4 4. CKD3 5. HLD -T chol 115, HDL 33, LDL 67, TG 77  Past Medical History: Past Medical History:  Diagnosis Date   Asthma    Bronchitis    COPD (chronic obstructive pulmonary disease) (HCC)    Diabetes mellitus without complication (HCC)    Heart murmur 70/35/0093   soft systolic murmur 1/6   Hypertension    Sleep apnea    Stroke [facility])     Past Surgical History: Past Surgical History:  Procedure Laterality Date   BUBBLE STUDY  09/18/2020   Procedure: BUBBLE STUDY;  Surgeon: Pixie Casino, MD;  Location: [place];  Service: Cardiovascular;;   COLONOSCOPY N/A 09/18/2016   Procedure: COLONOSCOPY;  Surgeon: Danie Binder, MD;  [facility];  Service: Endoscopy;  Laterality: N/A;  10:30 AM   lipoma removal     TEE WITHOUT CARDIOVERSION N/A 09/18/2020   Procedure: TRANSESOPHAGEAL ECHOCARDIOGRAM (TEE);  Surgeon: Pixie Casino, MD;  Location: [facility] ENDOSCOPY;  Service: Cardiovascular;  Laterality: N/A;    Current Medications: No outpatient medications have been marked as taking for the 11/24/20 encounter (Appointment) with O'Neal, Cassie Freer, MD.     Allergies:    Dust mite extract and Lisinopril   Social History: Social History   Socioeconomic History   Marital status: Married    Spouse name: Not on file   Number of children: Not on file   Years of education: Not on file   Highest education level:  Not on file  Occupational History   Not on file  Tobacco Use   Smoking status: Every Day    Packs/day: 0.25    Types: Cigarettes   Smokeless tobacco: Never   Tobacco comments:    Quitting per patient  Vaping Use   Vaping Use: Never used  Substance and Sexual Activity   Alcohol use: Yes    Alcohol/week: 0.0 standard drinks    Comment: occasionally   Drug use: No   Sexual activity: Yes    Birth control/protection: None  Other Topics Concern   Not on file  Social History Narrative   Not on file   Social Determinants of Health   Financial Resource Strain: Not on file  Food Insecurity: Not on file  Transportation Needs: Not on file  Physical Activity: Not on file  Stress: Not on file  Social Connections: Not on file     Family History: The patient's ***family history includes Heart attack in his maternal grandfather and paternal grandmother; Heart attack (age of onset: 59) in his father; Hypertension in his brother and sister; Stomach cancer in his brother; Stroke in his brother, maternal grandmother, and mother.  ROS:   All other ROS reviewed and negative. Pertinent positives noted in the HPI.    EKGs/Labs/Other Studies Reviewed:   The following studies were personally  reviewed by me today:  EKG:  EKG is *** ordered today.  The ekg ordered today demonstrates ***, and was personally reviewed by me.   TTE 09/07/2020   1. Left ventricular ejection fraction, by estimation, is 60 to 65%. The  left ventricle has normal function. The left ventricle has no regional  wall motion abnormalities. There is severe left ventricular hypertrophy.  Left ventricular diastolic parameters   are consistent with Grade I diastolic dysfunction (impaired relaxation).   2. Right ventricular systolic function is normal. The right ventricular  size is normal. Tricuspid regurgitation signal is inadequate for assessing  PA pressure.   3. The mitral valve is normal in structure. No evidence of  mitral valve  regurgitation. No evidence of mitral stenosis.   4. The aortic valve has an indeterminant number of cusps. Aortic valve  regurgitation is not visualized. No aortic stenosis is present.   TEE 09/18/2020  1. Left ventricular ejection fraction, by estimation, is 60 to 65%. The  left ventricle has normal function. The left ventricle has no regional  wall motion abnormalities. There is severe left ventricular hypertrophy.   2. Right ventricular systolic function is normal. The right ventricular  size is normal.   3. No left atrial/left atrial appendage thrombus was detected.   4. The mitral valve is grossly normal. Trivial mitral valve  regurgitation.   5. The aortic valve is tricuspid. Aortic valve regurgitation is mild.   Recent Labs: 09/18/2020: Magnesium 2.1 09/20/2020: ALT 25; BUN 23; Creatinine, Ser 1.57; Hemoglobin 14.8; Platelets 211; Potassium 3.9; Sodium 136   Recent Lipid Panel    Component Value Date/Time   CHOL 115 09/16/2020 0406   TRIG 77 09/16/2020 0406   HDL 33 (L) 09/16/2020 0406   CHOLHDL 3.5 09/16/2020 0406   VLDL 15 09/16/2020 0406   LDLCALC 67 09/16/2020 0406    Physical Exam:   VS:  There were no vitals taken for this visit.   Wt Readings from Last 3 Encounters:  10/17/20 190 lb 12.8 oz (86.5 kg)  10/10/20 196 lb 13.9 oz (89.3 kg)  09/18/20 225 lb 1.4 oz (102.1 kg)    General: Well nourished, well developed, in no acute distress Head: Atraumatic, normal size  Eyes: PEERLA, EOMI  Neck: Supple, no JVD Endocrine: No thryomegaly Cardiac: Normal S1, S2; RRR; no murmurs, rubs, or gallops Lungs: Clear to auscultation bilaterally, no wheezing, rhonchi or rales  Abd: Soft, nontender, no hepatomegaly  Ext: No edema, pulses 2+ Musculoskeletal: No deformities, BUE and BLE strength normal and equal Skin: Warm and dry, no rashes   Neuro: Alert and oriented to person, place, time, and situation, CNII-XII grossly intact, no focal deficits  Psych: Normal  mood and affect   ASSESSMENT:   Craig Knight is a 55 y.o. male who presents for the following: No diagnosis found.  PLAN:   There are no diagnoses linked to this encounter.  {Are you ordering a CV Procedure (e.g. stress test, cath, DCCV, TEE, etc)?   Press F2        :[id number]}  Disposition: No follow-ups on file.  Medication Adjustments/Labs and Tests Ordered: Current medicines are reviewed at length with the patient today.  Concerns regarding medicines are outlined above.  No orders of the defined types were placed in this encounter.  No orders of the defined types were placed in this encounter.   There are no Patient Instructions on file for this visit.   Time Spent with Patient: I have  spent a total of *** minutes with patient reviewing hospital notes, telemetry, EKGs, labs and examining the patient as well as establishing an assessment and plan that was discussed with the patient.  > 50% of time was spent in direct patient care.  Signed, Addison Naegeli. Audie Box, MD, [place]  [address] [place], [place] [address] [phone]  11/23/2020 5:03 PM

## 2020-11-24 ENCOUNTER — Ambulatory Visit (HOSPITAL_BASED_OUTPATIENT_CLINIC_OR_DEPARTMENT_OTHER): Payer: Medicare Other | Admitting: Cardiovascular Disease

## 2020-11-24 DIAGNOSIS — I119 Hypertensive heart disease without heart failure: Secondary | ICD-10-CM

## 2020-11-24 DIAGNOSIS — E782 Mixed hyperlipidemia: Secondary | ICD-10-CM

## 2020-11-24 DIAGNOSIS — I639 Cerebral infarction, unspecified: Secondary | ICD-10-CM

## 2020-11-24 DIAGNOSIS — I10 Essential (primary) hypertension: Secondary | ICD-10-CM

## 2020-11-27 ENCOUNTER — Encounter: Payer: Medicare Other | Admitting: Registered Nurse

## 2020-11-27 ENCOUNTER — Other Ambulatory Visit: Payer: Self-pay

## 2020-11-28 ENCOUNTER — Ambulatory Visit (HOSPITAL_COMMUNITY): Payer: Medicare Other

## 2020-11-28 ENCOUNTER — Encounter (HOSPITAL_COMMUNITY): Payer: Self-pay | Admitting: Physical Therapy

## 2020-11-28 ENCOUNTER — Encounter (HOSPITAL_COMMUNITY): Payer: Self-pay

## 2020-11-28 ENCOUNTER — Ambulatory Visit (HOSPITAL_COMMUNITY): Payer: Medicare Other | Admitting: Physical Therapy

## 2020-11-28 DIAGNOSIS — I10 Essential (primary) hypertension: Secondary | ICD-10-CM | POA: Diagnosis present

## 2020-11-28 DIAGNOSIS — R2689 Other abnormalities of gait and mobility: Secondary | ICD-10-CM | POA: Insufficient documentation

## 2020-11-28 DIAGNOSIS — I639 Cerebral infarction, unspecified: Secondary | ICD-10-CM | POA: Diagnosis not present

## 2020-11-28 DIAGNOSIS — E1142 Type 2 diabetes mellitus with diabetic polyneuropathy: Secondary | ICD-10-CM | POA: Diagnosis present

## 2020-11-28 DIAGNOSIS — M6281 Muscle weakness (generalized): Secondary | ICD-10-CM | POA: Insufficient documentation

## 2020-11-28 DIAGNOSIS — R278 Other lack of coordination: Secondary | ICD-10-CM | POA: Insufficient documentation

## 2020-11-28 DIAGNOSIS — R29818 Other symptoms and signs involving the nervous system: Secondary | ICD-10-CM

## 2020-11-28 DIAGNOSIS — R262 Difficulty in walking, not elsewhere classified: Secondary | ICD-10-CM | POA: Insufficient documentation

## 2020-11-28 DIAGNOSIS — M79601 Pain in right arm: Secondary | ICD-10-CM | POA: Insufficient documentation

## 2020-11-28 NOTE — Therapy (Signed)
[place], [place], [address] Phone: [phone]   Fax:  [phone]  Occupational Therapy Treatment  Patient Details  Name: Craig Knight MRN: [id number] Date of Birth: 01-20-1966 Referring Provider (OT): Lauraine Rinne, PA-C   Encounter Date: 11/28/2020   OT End of Session - 11/28/20 1554     Visit Number 10    Number of Visits 17    Date for OT Re-Evaluation 12/20/20    Authorization Type U[employer] Medicare; $30 copay    Progress Note Due on Visit 19    OT Start Time 0900    OT Stop Time 0938    OT Time Calculation (min) 38 min    Activity Tolerance Patient tolerated treatment well    Behavior During Therapy [facility] for tasks assessed/performed             Past Medical History:  Diagnosis Date   Asthma    Bronchitis    COPD (chronic obstructive pulmonary disease) ([place])    Diabetes mellitus without complication ([place])    Heart murmur 78/58/8502   soft systolic murmur 1/6   Hypertension    Sleep apnea    Stroke [facility])     Past Surgical History:  Procedure Laterality Date   BUBBLE STUDY  09/18/2020   Procedure: BUBBLE STUDY;  Surgeon: Pixie Casino, MD;  Location: [place];  Service: Cardiovascular;;   COLONOSCOPY N/A 09/18/2016   Procedure: COLONOSCOPY;  Surgeon: Danie Binder, MD;  [facility];  Service: Endoscopy;  Laterality: N/A;  10:30 AM   lipoma removal     TEE WITHOUT CARDIOVERSION N/A 09/18/2020   Procedure: TRANSESOPHAGEAL ECHOCARDIOGRAM (TEE);  Surgeon: Pixie Casino, MD;  Location: [facility] ENDOSCOPY;  Service: Cardiovascular;  Laterality: N/A;    There were no vitals filed for this visit.   Subjective Assessment - 11/28/20 1547     Subjective  S: Will this help my arm move? (when kinsiotape is applied.)    Patient is accompanied by: Family member   Wife   Currently in Pain? No/denies                [facility] OT Assessment - 11/28/20 1548       Assessment    Medical Diagnosis s/p CVA      Precautions   Precautions Fall;Other (comment)    Precaution Comments right side hemiparesis                      OT Treatments/Exercises (OP) - 11/28/20 1548       Neurological Re-education Exercises   Other Exercises 1 During on ramp of ES, patient completed either scapular elevation hold or shoulder abduction.isometric hold.    Other Exercises 2 Functional grasp and release task completed using bilateral UE. Patient interlocked his fingers while transferring 10 cones one at a time from right side to left side of table top. Completed task in reverse: cones from left side to right side.      Modalities   Modalities Electrical engineer Stimulation Location right scapularis region. right deltoid    Electrical Stimulation Action Engineer, petroleum Parameters Right upper trapezious and levator: 31 mA CC 5'. Right deltoid 44 mA CC 5' 5/5 cycle    Electrical Stimulation Goals Neuromuscular facilitation      Manual Therapy   Manual Therapy Taping    Manual therapy  comments Kinesiotape applied to right shoulder for [facility] mechanical correction.                    OT Education - 11/28/20 1554     Education Details Encouraged spouse and patient to complete grasp and release task while interlocking fingers at home when able.    Person(s) Educated Spouse;Patient    Methods Explanation    Comprehension Verbalized understanding              OT Short Term Goals - 11/28/20 1558       OT SHORT TERM GOAL #1   Title Pt and caregiver will be educated on HEP to improve independence in ADL completion.    Time 4    Period Weeks    Target Date 11/25/20      OT SHORT TERM GOAL #2   Title Pt will demonstrate correct completion of weightbearing techniques to support nerve reinervation and decrease shoulder subluxation.    Time 4    Period Weeks    Status On-going      OT SHORT  TERM GOAL #3   Title Pt will be educated on and demonstrate proficiency in completion of adaptive strategies and techniques to improve independence in ADLs such as dressing, bathing, and simple meal preparation tasks.    Time 4    Period Weeks      OT SHORT TERM GOAL #4   Title Pt will increase P/ROM to [facility] to improve ability to complete RUE stretching tasks and decrease pain with functional tasks.    Time 4    Period Weeks    Status On-going      OT SHORT TERM GOAL #5   Title Pt and caregiver will demonstrate understanding of orthotics wear and donning/doffing such as sling and/or splinting to support RUE during ADLs and mobility tasks.    Time 4    Period Weeks    Status On-going      OT SHORT TERM GOAL #6   Title Patient will present with 1/5 right UE shoulder for flexion, extension, and scapular elevation which will allow patient to begin using his RUE as stabilizer during self care tasks as able.    Time 4    Period Weeks    Status On-going    Target Date 12/20/20               OT Long Term Goals - 11/22/20 1033       OT LONG TERM GOAL #1   Title -                   Plan - 11/28/20 1555     Clinical Impression Statement A: Reapploied kinsiotape to right shoulder to help with joint stability and alignment. NMES used during session to provide neuromuscular re-education and strength to right shoulder. No visible movement of shoulder noted when placed on the upper trapezius region. Able to achieve muscle activation when placed on the deltoid to complete isometric hold.    Body Structure / Function / Physical Skills ADL;Dexterity;Flexibility;ROM;Strength;Vision;Balance;Coordination;Edema;FMC;IADL;Tone;UE functional use;Sensation;Endurance;Decreased knowledge of precautions;Body mechanics;Decreased knowledge of use of DME;GMC;Mobility;Proprioception;Pain    Plan P: Continue with grasp and release with both hands interlocked. NMES for muscle re-education.     Consulted and Agree with Plan of Care Patient             Patient will benefit from skilled therapeutic intervention in order to improve the following deficits and impairments:  Body Structure / Function / Physical Skills: ADL, Dexterity, Flexibility, ROM, Strength, Vision, Balance, Coordination, Edema, FMC, IADL, Tone, UE functional use, Sensation, Endurance, Decreased knowledge of precautions, Body mechanics, Decreased knowledge of use of DME, GMC, Mobility, Proprioception, Pain       Visit Diagnosis: Other symptoms and signs involving the nervous system  Other lack of coordination    Problem List Patient Active Problem List   Diagnosis Date Noted   Dysphagia, post-stroke    Essential hypertension    Diabetic peripheral neuropathy (HCC)    Infarction of left basal ganglia ([place]) 09/19/2020   Acute CVA (cerebrovascular accident) ([place]) 09/06/2020   Stroke ([place]) 11/23/2018   Numbness 11/23/2018   CVA (cerebral vascular accident) ([place]) 11/23/2018   Lung nodule 11/17/2017   Chest pressure 11/22/2016   Shortness of breath 11/22/2016   Depression, major, single episode, moderate ([place]) 10/14/2016   Special screening for malignant neoplasms, colon    Chronic obstructive pulmonary disease ([place]) 06/08/2016   Hypertensive emergency 02/16/2016   Hypertensive urgency 06/07/2015   CKD (chronic kidney disease), stage III ([place]) 06/07/2015   Pain in the chest    Elevated troponin    Chronic diastolic CHF (congestive heart failure) ([place]) 09/17/2013   DM type 2 (diabetes mellitus, type 2) ([place]) 09/15/2013   Chest pain 08/01/2011   HTN (hypertension) 08/01/2011   Chest pain 07/22/2011   Renal insufficiency 07/22/2011   Tobacco abuse 07/22/2011   Hypertension     Ailene Ravel, OTR/L,CBIS  [phone]  11/28/2020, 3:59 PM  [place], [place], [address] Phone: [phone]   Fax:  [phone]  Name: YOSHI MANCILLAS MRN: [id number] Date of Birth: Feb 09, 1966

## 2020-11-29 NOTE — Therapy (Signed)
[place], [place], [address] Phone: [phone]   Fax:  [phone]  Physical Therapy Treatment  Patient Details  Name: Craig Knight MRN: [id number] Date of Birth: [birth year] Referring Provider (PT): Alysia Penna MD  Progress Note Reporting Period 10/23/20 to 11/28/20  See note below for Objective Data and Assessment of Progress/Goals.     Encounter Date: 11/28/2020   11/28/20 1039  Symptoms/Limitations  Subjective Patient says things are going well. He has been walking more with his walker at home. He does note some increased swelling in his RT foot.  Pain Assessment  Currently in Pain? No/denies     Past Medical History:  Diagnosis Date   Asthma    Bronchitis    COPD (chronic obstructive pulmonary disease) ([place])    Diabetes mellitus without complication ([place])    Heart murmur 75/64/3329   soft systolic murmur 1/6   Hypertension    Sleep apnea    Stroke [facility])     Past Surgical History:  Procedure Laterality Date   BUBBLE STUDY  09/18/2020   Procedure: BUBBLE STUDY;  Surgeon: Pixie Casino, MD;  Location: [place];  Service: Cardiovascular;;   COLONOSCOPY N/A 09/18/2016   Procedure: COLONOSCOPY;  Surgeon: Danie Binder, MD;  [facility];  Service: Endoscopy;  Laterality: N/A;  10:30 AM   lipoma removal     TEE WITHOUT CARDIOVERSION N/A 09/18/2020   Procedure: TRANSESOPHAGEAL ECHOCARDIOGRAM (TEE);  Surgeon: Pixie Casino, MD;  Location[facility] ENDOSCOPY;  Service: Cardiovascular;  Laterality: N/A;    There were no vitals filed for this visit.    11/28/20 0001  Assessment  Medical Diagnosis s/p CVA  Referring Provider (PT) Alysia Penna MD  Prior Therapy Yes  Precautions  Precautions Fall;Other (comment)  Precaution Comments right side hemiparesis  Restrictions  Weight Bearing Restrictions No  Balance Screen  Has the patient fallen in the past 6 months No (none  since starting therapy)  [place] residence  Living Arrangements Spouse/significant other;Children  Available Help at Discharge Family  Prior Function  Level of Independence Independent  Cognition  Overall Cognitive Status Impaired/Different from baseline  Strength  Right Hip Flexion 1/5  Left Hip Flexion 5/5  Right Knee Extension 1/5  Right Ankle Dorsiflexion 0/5  Left Ankle Dorsiflexion 5/5  Right Knee Flexion 2+/5  Right Hip ABduction 1/5 (seated)  Right Hip ADduction 1/5 (seated)  Left Hip ABduction 5/5 (seated)  Left Hip ADduction 5/5 (seated)  Bed Mobility  Rolling Right  (Unable due to pain)  Rolling Left Minimal Assistance - Patient > 75% (assist for RLE)  Supine to Sit Minimal Assistance - Patient > 75%  Transfers  Transfers Sit to Stand;Stand Pivot Transfers  Sit to Stand 6: Modified independent (Device/Increase time);5: Supervision  Stand Pivot Transfers 6: Modified independent (Device/Increase time);5: Supervision  Ambulation/Gait  Ambulation/Gait Yes  Ambulation/Gait Assistance 4: Min assist  Ambulation Distance (Feet) 20 Feet  Assistive device Hemi-walker  Gait Pattern Decreased hip/knee flexion - right;Decreased dorsiflexion - right;Decreased weight shift to right;Right flexed knee in stance;Poor foot clearance - right        PT Short Term Goals - 11/02/20 1127       PT SHORT TERM GOAL #1   Title Patient will be independent with initial HEP and self-management strategies to improve functional outcomes    Time 3    Period Weeks    Status On-going  Target Date 11/13/20               PT Long Term Goals - 11/02/20 1127       PT LONG TERM GOAL #1   Title Patient will be able to ambualte 150 feet using LRAD for improved funcitonal and household mobililty    Time 6    Period Weeks    Status On-going      PT LONG TERM GOAL #2   Title Patient will be able to perform bed mobility and functional  transfers mod I    Time 8    Period Weeks    Status On-going                   Plan - 11/28/20 1724     Clinical Impression Statement Patient showing steady progress toward therapy goals. Patient remains limited in RLE AROM, and demos trace hip flexion, knee extension and ankle DF. Patient does show improvement with WB capacity and ambulation. He is able to ambulate household distance with hemi walker and min/ mod assist for RLE swing. Patient will continue to benefit from skilled therapy services to progress standing tolerance and balance for improved functional mobility and reduced burden of care.    Comorbidities multiple CVAs    Examination-Activity Limitations Transfers;Locomotion Level    Examination-Participation Restrictions Community Activity;Driving;Cleaning    Stability/Clinical Decision Making Stable/Uncomplicated    Rehab Potential Fair    PT Frequency 2x / week    PT Duration 8 weeks    PT Treatment/Interventions ADLs/Self Care Home Management;Aquatic Therapy;DME Instruction;Gait training;Stair training;Functional mobility training;Therapeutic activities;Therapeutic exercise;Balance training;Neuromuscular re-education;Patient/family education;Orthotic Fit/Training;Passive range of motion;Wheelchair mobility training;Visual/perceptual remediation/compensation;Energy conservation;Spinal Manipulations;Splinting;Joint Manipulations;Taping;Vasopneumatic Device;Compression bandaging;Manual techniques;Dry needling;Cognitive remediation;Electrical Stimulation;Cryotherapy;Biofeedback;Moist Heat;Traction;Iontophoresis 4mg /ml Dexamethasone    PT Next Visit Plan ambulation with hemi walker. Progress funcitonal activity as tolerated, begin on mat and progress to standing> balance> gait. f/u on sidelying comfort/ ability    PT Home Exercise Plan Eval: seated march, LAQs with assist as needed 9/19 assisted heelslides with iso holds to fatigue    Consulted and Agree with Plan of Care  Patient;Family member/caregiver             Patient will benefit from skilled therapeutic intervention in order to improve the following deficits and impairments:  Abnormal gait, Difficulty walking, Decreased endurance, Decreased activity tolerance, Decreased balance, Decreased mobility, Decreased strength, Postural dysfunction  Visit Diagnosis: Other abnormalities of gait and mobility  Difficulty in walking, not elsewhere classified  Muscle weakness (generalized)     Problem List Patient Active Problem List   Diagnosis Date Noted   Dysphagia, post-stroke    Essential hypertension    Diabetic peripheral neuropathy (HCC)    Infarction of left basal ganglia ([place]) 09/19/2020   Acute CVA (cerebrovascular accident) ([place]) 09/06/2020   Stroke ([place]) 11/23/2018   Numbness 11/23/2018   CVA (cerebral vascular accident) ([place]) 11/23/2018   Lung nodule 11/17/2017   Chest pressure 11/22/2016   Shortness of breath 11/22/2016   Depression, major, single episode, moderate ([place]) 10/14/2016   Special screening for malignant neoplasms, colon    Chronic obstructive pulmonary disease ([place]) 06/08/2016   Hypertensive emergency 02/16/2016   Hypertensive urgency 06/07/2015   CKD (chronic kidney disease), stage III ([place]) 06/07/2015   Pain in the chest    Elevated troponin    Chronic diastolic CHF (congestive heart failure) ([place]) 09/17/2013   DM type 2 (diabetes mellitus, type 2) ([place]) 09/15/2013   Chest pain 08/01/2011  HTN (hypertension) 08/01/2011   Chest pain 07/22/2011   Renal insufficiency 07/22/2011   Tobacco abuse 07/22/2011   Hypertension    6:56 PM, 11/29/20 Josue Hector PT DPT  Physical Therapist with [facility]  [phone] [place] [address] [place], [place], [address] Phone: [phone]   Fax:  [phone]  Name: Craig Knight MRN: [id number] Date of Birth: 1966/02/07

## 2020-11-29 NOTE — Progress Notes (Signed)
   11/28/20 0001  Assessment  Medical Diagnosis s/p CVA  Referring Provider (PT) Alysia Penna MD  Prior Therapy Yes  Precautions  Precautions Fall;Other (comment)  Precaution Comments right side hemiparesis  Restrictions  Weight Bearing Restrictions No  Balance Screen  Has the patient fallen in the past 6 months No (none since starting therapy)  [place] residence  Living Arrangements Spouse/significant other;Children  Available Help at Discharge Family  Prior Function  Level of Independence Independent  Cognition  Overall Cognitive Status Impaired/Different from baseline  Strength  Right Hip Flexion 1/5  Left Hip Flexion 5/5  Right Knee Extension 1/5  Right Ankle Dorsiflexion 0/5  Left Ankle Dorsiflexion 5/5  Right Knee Flexion 2+/5  Right Hip ABduction 1/5 (seated)  Right Hip ADduction 1/5 (seated)  Left Hip ABduction 5/5 (seated)  Left Hip ADduction 5/5 (seated)  Bed Mobility  Rolling Right  (Unable due to pain)  Rolling Left Minimal Assistance - Patient > 75% (assist for RLE)  Supine to Sit Minimal Assistance - Patient > 75%  Transfers  Transfers Sit to Stand;Stand Pivot Transfers  Sit to Stand 6: Modified independent (Device/Increase time);5: Supervision  Stand Pivot Transfers 6: Modified independent (Device/Increase time);5: Supervision  Ambulation/Gait  Ambulation/Gait Yes  Ambulation/Gait Assistance 4: Min assist  Ambulation Distance (Feet) 20 Feet  Assistive device Hemi-walker  Gait Pattern Decreased hip/knee flexion - right;Decreased dorsiflexion - right;Decreased weight shift to right;Right flexed knee in stance;Poor foot clearance - right

## 2020-11-29 NOTE — Progress Notes (Signed)
   11/28/20 1039  Symptoms/Limitations  Subjective Patient says things are going well. He has been walking more with his walker at home. He does note some increased swelling in his RT foot.  Pain Assessment  Currently in Pain? No/denies

## 2020-11-29 NOTE — Progress Notes (Signed)
   11/28/20 0001  Assessment  Medical Diagnosis s/p CVA  Referring Provider (PT) Alysia Penna MD  Prior Therapy Yes  Precautions  Precautions Fall;Other (comment)  Precaution Comments right side hemiparesis  Restrictions  Weight Bearing Restrictions No  Balance Screen  Has the patient fallen in the past 6 months No (none since starting therapy)  [place] residence  Living Arrangements Spouse/significant other;Children  Available Help at Discharge Family  Prior Function  Level of Independence Independent  Cognition  Overall Cognitive Status Impaired/Different from baseline  Strength  Right Hip Flexion 1/5  Left Hip Flexion 5/5  Right Knee Extension 1/5  Right Ankle Dorsiflexion 0/5  Left Ankle Dorsiflexion 5/5  Right Knee Flexion 2+/5  Right Hip ABduction 1/5 (seated)  Right Hip ADduction 1/5 (seated)  Left Hip ABduction 5/5 (seated)  Left Hip ADduction 5/5 (seated)  Bed Mobility  Rolling Right  (Unable due to pain)  Rolling Left Minimal Assistance - Patient > 75% (assist for RLE)  Supine to Sit Minimal Assistance - Patient > 75%  Transfers  Transfers Sit to Stand;Stand Pivot Transfers  Sit to Stand 6: Modified independent (Device/Increase time);5: Supervision  Stand Pivot Transfers 6: Modified independent (Device/Increase time);5: Supervision  Ambulation/Gait  Ambulation/Gait Yes  Ambulation/Gait Assistance 4: Min assist  Ambulation Distance (Feet) 20 Feet  Assistive device Hemi-walker  Gait Pattern Decreased hip/knee flexion - right;Decreased dorsiflexion - right;Decreased weight shift to right;Right flexed knee in stance;Poor foot clearance - right

## 2020-11-30 ENCOUNTER — Other Ambulatory Visit: Payer: Self-pay

## 2020-11-30 ENCOUNTER — Ambulatory Visit (HOSPITAL_COMMUNITY): Payer: Medicare Other

## 2020-11-30 DIAGNOSIS — I639 Cerebral infarction, unspecified: Secondary | ICD-10-CM | POA: Diagnosis not present

## 2020-11-30 DIAGNOSIS — M6281 Muscle weakness (generalized): Secondary | ICD-10-CM

## 2020-11-30 DIAGNOSIS — R2689 Other abnormalities of gait and mobility: Secondary | ICD-10-CM

## 2020-11-30 DIAGNOSIS — R262 Difficulty in walking, not elsewhere classified: Secondary | ICD-10-CM

## 2020-11-30 NOTE — Therapy (Signed)
[place], [place], [address] Phone: [phone]   Fax:  [phone]  Physical Therapy Treatment  Patient Details  Name: Craig Knight MRN: [id number] Date of Birth: May 05, 1965 Referring Provider (PT): Alysia Penna MD   Encounter Date: 11/30/2020   PT End of Session - 11/30/20 1554     Visit Number 11    Number of Visits 18    Date for PT Re-Evaluation 12/26/20    Authorization Type UHC Medicare    Progress Note Due on Visit 18    PT Start Time 1452    PT Stop Time 1530    PT Time Calculation (min) 38 min    Equipment Utilized During Treatment Gait belt    Activity Tolerance Patient limited by fatigue;Patient tolerated treatment well    Behavior During Therapy [facility] for tasks assessed/performed             Past Medical History:  Diagnosis Date   Asthma    Bronchitis    COPD (chronic obstructive pulmonary disease) ([place])    Diabetes mellitus without complication ([place])    Heart murmur 56/38/7564   soft systolic murmur 1/6   Hypertension    Sleep apnea    Stroke [facility])     Past Surgical History:  Procedure Laterality Date   BUBBLE STUDY  09/18/2020   Procedure: BUBBLE STUDY;  Surgeon: Pixie Casino, MD;  Location: [place];  Service: Cardiovascular;;   COLONOSCOPY N/A 09/18/2016   Procedure: COLONOSCOPY;  Surgeon: Danie Binder, MD;  [facility];  Service: Endoscopy;  Laterality: N/A;  10:30 AM   lipoma removal     TEE WITHOUT CARDIOVERSION N/A 09/18/2020   Procedure: TRANSESOPHAGEAL ECHOCARDIOGRAM (TEE);  Surgeon: Pixie Casino, MD;  Location: [facility] ENDOSCOPY;  Service: Cardiovascular;  Laterality: N/A;    There were no vitals filed for this visit.   Subjective Assessment - 11/30/20 1503     Subjective Pt arrived with AFO in hand.  Reports he has not been walking due to swelling in Rt foot.                               [place] Adult PT  Treatment/Exercise - 11/30/20 0001       Ambulation/Gait   Ambulation/Gait Yes    Ambulation/Gait Assistance 4: Min assist    Ambulation Distance (Feet) 20 Feet    Assistive device Hemi-walker    Gait Pattern Decreased hip/knee flexion - right;Decreased dorsiflexion - right;Decreased weight shift to right;Right flexed knee in stance;Poor foot clearance - right    Ambulation Surface Level;Indoor    Pre-Gait Activities Weight bearing activities prior gait.  Inside // bars sidestep, retro gait, lateral weight shifting    Gait Comments Cueing for sequence      Knee/Hip Exercises: Standing   Other Standing Knee Exercises lateral weight shifting x 20 bilateral    Other Standing Knee Exercises forward, retro and sidestep 1RT inside // bars                     PT Education - 11/30/20 1601     Education Details Educated on benefits with compression socks to assist with edema control.    Person(s) Educated Spouse;Patient    Methods Explanation;Handout    Comprehension Verbalized understanding;Need further instruction              PT Short  Term Goals - 11/02/20 1127       PT SHORT TERM GOAL #1   Title Patient will be independent with initial HEP and self-management strategies to improve functional outcomes    Time 3    Period Weeks    Status On-going    Target Date 11/13/20               PT Long Term Goals - 11/02/20 1127       PT LONG TERM GOAL #1   Title Patient will be able to ambualte 150 feet using LRAD for improved funcitonal and household mobililty    Time 6    Period Weeks    Status On-going      PT LONG TERM GOAL #2   Title Patient will be able to perform bed mobility and functional transfers mod I    Time 8    Period Weeks    Status On-going                   Plan - 11/30/20 1555     Clinical Impression Statement Pt educated on benefits with compression socks for edema control, measurements taken and given handout for [place] pt and wife to brings socks in if difficulty wiht donning/doffing and therapist can show strategies to help.  Session foucs with Rt LE weight bearing and gait mechanics with hemiwalker.  Pt demonstrates trace hip flexion, required assistance to advance Rt LE and min cueing for sequence.    Comorbidities multiple CVAs    Examination-Activity Limitations Transfers;Locomotion Level    Examination-Participation Restrictions Community Activity;Driving;Cleaning    Stability/Clinical Decision Making Stable/Uncomplicated    Clinical Decision Making Low    Rehab Potential Fair    PT Frequency 2x / week    PT Duration 8 weeks    PT Treatment/Interventions ADLs/Self Care Home Management;Aquatic Therapy;DME Instruction;Gait training;Stair training;Functional mobility training;Therapeutic activities;Therapeutic exercise;Balance training;Neuromuscular re-education;Patient/family education;Orthotic Fit/Training;Passive range of motion;Wheelchair mobility training;Visual/perceptual remediation/compensation;Energy conservation;Spinal Manipulations;Splinting;Joint Manipulations;Taping;Vasopneumatic Device;Compression bandaging;Manual techniques;Dry needling;Cognitive remediation;Electrical Stimulation;Cryotherapy;Biofeedback;Moist Heat;Traction;Iontophoresis 4mg /ml Dexamethasone    PT Next Visit Plan Attempt NMR Rt LE quad.  ambulation with hemi walker. Progress funcitonal activity as tolerated, begin on mat and progress to standing> balance> gait. f/u on sidelying comfort/ ability    PT Home Exercise Plan Eval: seated march, LAQs with assist as needed 9/19 assisted heelslides with iso holds to fatigue    Consulted and Agree with Plan of Care Patient;Family member/caregiver    Family Member Consulted wife             Patient will benefit from skilled therapeutic intervention in order to improve the following deficits and impairments:  Abnormal gait, Difficulty walking, Decreased  endurance, Decreased activity tolerance, Decreased balance, Decreased mobility, Decreased strength, Postural dysfunction  Visit Diagnosis: Other abnormalities of gait and mobility  Difficulty in walking, not elsewhere classified  Muscle weakness (generalized)     Problem List Patient Active Problem List   Diagnosis Date Noted   Dysphagia, post-stroke    Essential hypertension    Diabetic peripheral neuropathy (HCC)    Infarction of left basal ganglia ([place]) 09/19/2020   Acute CVA (cerebrovascular accident) ([place]) 09/06/2020   Stroke ([place]) 11/23/2018   Numbness 11/23/2018   CVA (cerebral vascular accident) ([place]) 11/23/2018   Lung nodule 11/17/2017   Chest pressure 11/22/2016   Shortness of breath 11/22/2016   Depression, major, single episode, moderate ([place]) 10/14/2016   Special screening for malignant neoplasms, colon  Chronic obstructive pulmonary disease ([place]) 06/08/2016   Hypertensive emergency 02/16/2016   Hypertensive urgency 06/07/2015   CKD (chronic kidney disease), stage III ([place]) 06/07/2015   Pain in the chest    Elevated troponin    Chronic diastolic CHF (congestive heart failure) ([place]) 09/17/2013   DM type 2 (diabetes mellitus, type 2) ([place]) 09/15/2013   Chest pain 08/01/2011   HTN (hypertension) 08/01/2011   Chest pain 07/22/2011   Renal insufficiency 07/22/2011   Tobacco abuse 07/22/2011   Hypertension    Ihor Austin, LPTA/CLT; CBIS [phone]  Aldona Lento, PTA 11/30/2020, 4:16 PM  [place] [address] [place], [place], [address] Phone: [phone]   Fax:  [phone]  Name: SEUNG NIDIFFER MRN: [id number] Date of Birth: [birth year]

## 2020-12-01 ENCOUNTER — Other Ambulatory Visit: Payer: Self-pay

## 2020-12-01 ENCOUNTER — Encounter: Payer: Self-pay | Admitting: Registered Nurse

## 2020-12-01 ENCOUNTER — Encounter (HOSPITAL_COMMUNITY): Payer: Self-pay | Admitting: Occupational Therapy

## 2020-12-01 ENCOUNTER — Ambulatory Visit (HOSPITAL_COMMUNITY): Payer: Medicare Other | Admitting: Occupational Therapy

## 2020-12-01 ENCOUNTER — Encounter: Payer: Medicare Other | Attending: Registered Nurse | Admitting: Registered Nurse

## 2020-12-01 VITALS — BP 118/83 | HR 68 | Temp 98.3°F | Ht 68.0 in | Wt 204.6 lb

## 2020-12-01 DIAGNOSIS — I639 Cerebral infarction, unspecified: Secondary | ICD-10-CM | POA: Insufficient documentation

## 2020-12-01 DIAGNOSIS — E1142 Type 2 diabetes mellitus with diabetic polyneuropathy: Secondary | ICD-10-CM | POA: Insufficient documentation

## 2020-12-01 DIAGNOSIS — R29818 Other symptoms and signs involving the nervous system: Secondary | ICD-10-CM

## 2020-12-01 DIAGNOSIS — I10 Essential (primary) hypertension: Secondary | ICD-10-CM | POA: Insufficient documentation

## 2020-12-01 DIAGNOSIS — M6281 Muscle weakness (generalized): Secondary | ICD-10-CM

## 2020-12-01 DIAGNOSIS — R278 Other lack of coordination: Secondary | ICD-10-CM

## 2020-12-01 NOTE — Therapy (Signed)
[place], [place], [address] Phone: [phone]   Fax:  [phone]  Occupational Therapy Treatment  Patient Details  Name: Craig Knight MRN: [id number] Date of Birth: 01-15-1966 Referring Provider (OT): Lauraine Rinne, PA-C   Encounter Date: 12/01/2020   OT End of Session - 12/01/20 1454     Visit Number 11    Number of Visits 17    Date for OT Re-Evaluation 12/20/20    Authorization Type UHC Medicare; $30 copay    Progress Note Due on Visit 13    OT Start Time 1301    OT Stop Time 1343    OT Time Calculation (min) 42 min    Activity Tolerance Patient tolerated treatment well    Behavior During Therapy [facility] for tasks assessed/performed             Past Medical History:  Diagnosis Date   Asthma    Bronchitis    COPD (chronic obstructive pulmonary disease) ([place])    Diabetes mellitus without complication ([place])    Heart murmur 96/78/9381   soft systolic murmur 1/6   Hypertension    Sleep apnea    Stroke [facility])     Past Surgical History:  Procedure Laterality Date   BUBBLE STUDY  09/18/2020   Procedure: BUBBLE STUDY;  Surgeon: Pixie Casino, MD;  Location: [place];  Service: Cardiovascular;;   COLONOSCOPY N/A 09/18/2016   Procedure: COLONOSCOPY;  Surgeon: Danie Binder, MD;  [facility];  Service: Endoscopy;  Laterality: N/A;  10:30 AM   lipoma removal     TEE WITHOUT CARDIOVERSION N/A 09/18/2020   Procedure: TRANSESOPHAGEAL ECHOCARDIOGRAM (TEE);  Surgeon: Pixie Casino, MD;  Location: [facility] ENDOSCOPY;  Service: Cardiovascular;  Laterality: N/A;    There were no vitals filed for this visit.   Subjective Assessment - 12/01/20 1259     Subjective  S: Numbness and tingling sometimes in R hand.    Currently in Pain? Yes    Pain Score 5     Pain Location Hand    Pain Orientation Right    Pain Descriptors / Indicators Tingling;Numbness    Pain Type Acute pain    Pain Onset  Today    Pain Frequency Intermittent    Aggravating Factors  bed and using the bathroom    Pain Relieving Factors tylonal    Effect of Pain on Daily Activities moderate    Multiple Pain Sites No                [facility] OT Assessment - 12/01/20 0001       Assessment   Medical Diagnosis s/p CVA    Prior Therapy Yes      Precautions   Precautions Fall;Other (comment)    Precaution Comments right side hemiparesis                      OT Treatments/Exercises (OP) - 12/01/20 0001       Exercises   Exercises --      Neurological Re-education Exercises   Other Exercises 1 During on ramp of ES, patient completed either scapular elevation hold or shoulder flexion.isometric hold.    Other Exercises 2 Functional grasp and release task completed using bilateral UE. Patient interlocked his fingers while sliding rings from L to R off the low rung.Pt then placed rings from R to L on medium levels of ring tree. Pt placed 3 rings  on 3rd level with moderate assitance.      Modalities   Modalities Electrical engineer Stimulation Location right scapularis region. right anterior deltoid region.    Warden/ranger Parameters Right upper trapezious and levator: 31 mA CC 5'. Right deltoid 34 mA CC 5' 5/5 cycle; Min A given to assist with mobility when pt was attempting to elvate shoulder.    Electrical Stimulation Goals Neuromuscular facilitation                      OT Short Term Goals - 11/28/20 1558       OT SHORT TERM GOAL #1   Title Pt and caregiver will be educated on HEP to improve independence in ADL completion.    Time 4    Period Weeks    Target Date 11/25/20      OT SHORT TERM GOAL #2   Title Pt will demonstrate correct completion of weightbearing techniques to support nerve reinervation and decrease shoulder subluxation.    Time 4    Period Weeks    Status  On-going      OT SHORT TERM GOAL #3   Title Pt will be educated on and demonstrate proficiency in completion of adaptive strategies and techniques to improve independence in ADLs such as dressing, bathing, and simple meal preparation tasks.    Time 4    Period Weeks      OT SHORT TERM GOAL #4   Title Pt will increase P/ROM to [facility] to improve ability to complete RUE stretching tasks and decrease pain with functional tasks.    Time 4    Period Weeks    Status On-going      OT SHORT TERM GOAL #5   Title Pt and caregiver will demonstrate understanding of orthotics wear and donning/doffing such as sling and/or splinting to support RUE during ADLs and mobility tasks.    Time 4    Period Weeks    Status On-going      OT SHORT TERM GOAL #6   Title Patient will present with 1/5 right UE shoulder for flexion, extension, and scapular elevation which will allow patient to begin using his RUE as stabilizer during self care tasks as able.    Time 4    Period Weeks    Status On-going    Target Date 12/20/20               OT Long Term Goals - 11/22/20 1033       OT LONG TERM GOAL #1   Title -                   Plan - 12/01/20 1457     Clinical Impression Statement A: Continued NMES this date with placement on anterior deltoid to promote forflexion movement of shoulder with isometric blocking. Pt also tolerated NMES for strengthening and re-education of shoulder elvation movement. Mild change in pressure noted when pt was attempting to put pressure thorugh arm into therapist's hand.  Minimal assist provided during stimulation to add more mobility. Pt completed funtional grasp and R arm mobility task of transfering rings on ring tree. Pt able to engage in task for lower two rungs with moderate assist to place 3 rings on the 3rd rung.    Body Structure / Function / Physical Skills ADL;Dexterity;Flexibility;ROM;Strength;Vision;Balance;Coordination;Edema;FMC;IADL;Tone;UE functional  use;Sensation;Endurance;Decreased knowledge of precautions;Body mechanics;Decreased knowledge  of use of DME;GMC;Mobility;Proprioception;Pain    Plan P: Continue with grasp and release with both hands interlocked. NMES for muscle re-education.    OT Home Exercise Plan eval: self-ROM for elbow flexion/extension, forearm supination/pronation 9/6: self ROM - shoulder, weightbearing into forearm             Patient will benefit from skilled therapeutic intervention in order to improve the following deficits and impairments:   Body Structure / Function / Physical Skills: ADL, Dexterity, Flexibility, ROM, Strength, Vision, Balance, Coordination, Edema, FMC, IADL, Tone, UE functional use, Sensation, Endurance, Decreased knowledge of precautions, Body mechanics, Decreased knowledge of use of DME, GMC, Mobility, Proprioception, Pain       Visit Diagnosis: Other lack of coordination  Muscle weakness (generalized)  Other symptoms and signs involving the nervous system    Problem List Patient Active Problem List   Diagnosis Date Noted   Dysphagia, post-stroke    Essential hypertension    Diabetic peripheral neuropathy (HCC)    Infarction of left basal ganglia ([place]) 09/19/2020   Acute CVA (cerebrovascular accident) ([place]) 09/06/2020   Stroke ([place]) 11/23/2018   Numbness 11/23/2018   CVA (cerebral vascular accident) ([place]) 11/23/2018   Lung nodule 11/17/2017   Chest pressure 11/22/2016   Shortness of breath 11/22/2016   Depression, major, single episode, moderate ([place]) 10/14/2016   Special screening for malignant neoplasms, colon    Chronic obstructive pulmonary disease ([place]) 06/08/2016   Hypertensive emergency 02/16/2016   Hypertensive urgency 06/07/2015   CKD (chronic kidney disease), stage III ([place]) 06/07/2015   Pain in the chest    Elevated troponin    Chronic diastolic CHF (congestive heart failure) ([place]) 09/17/2013   DM type 2 (diabetes mellitus, type 2) ([place]) 09/15/2013   Chest  pain 08/01/2011   HTN (hypertension) 08/01/2011   Chest pain 07/22/2011   Renal insufficiency 07/22/2011   Tobacco abuse 07/22/2011   Hypertension    Geralda Baumgardner OT, MOT  Larey Seat, OT/L 12/01/2020, 3:01 PM  [place], [place], [address] Phone: [phone]   Fax:  [phone]  Name: Craig Knight MRN: [id number] Date of Birth: Jun 27, 1965

## 2020-12-01 NOTE — Progress Notes (Signed)
Subjective:    Patient ID: Craig Knight, male    DOB: Feb 25, 1966, 55 y.o.   MRN: [id number]  HPI: Craig Knight is a 55 y.o. male who is here for HFU appointment for follow up of his Infarction of left basal ganglia, Essential Hypertension and Diabetic peripheral Neuropathy.  Past Hospitalization on 09/06/2020- 09/08/2020 for Right PCA Territory infarction with residual mild right side weakness, per discharge summary Daniel Angiulli PA-C.   He presented to [facility] on 09/15/2020 :  Dr Waldron Labs HPI:  Craig Knight  is a 55 y.o. male, HTN, DM-2, diastolic CHF, HER-7E, COPD , with recent hospitalization 7/13 with acute right PCA territory infarct with residual mild right side weakness,, left hemianopsia, right facial droop, patient presents to ED initially with complaints of shortness of breath, as well he does report worsening right side weakness, but report it had mainly worsened this morning, patient received Solu-Medrol, nebulizer treatment, currently denies any respiratory symptoms, denies any recent falls, headache, change in vision, patient report he is compliant with his medication including statin, aspirin and Brilinta, he denies any fever, chills, or cough. - in ED his work-up significant for creatinine of 2 around his baseline, high-sensitivity troponin of 33, he denies any chest pain, he has mild leukocytosis at 12.8, he is afebrile, he had MRI brain/MRA head and neck significant for diffuse intracranial atherosclerosis disease, moderate to severe, with occlusion of right PCA, severe stenosis of left PCA, MRI of brain showing acute left basal ganglia infarct, and evolving subacute posterior circulation infarct without extension, ED physician discussed with neurology at [facility] Dr. Leonel Ramsay who requested admission for work-up at [facility] as no neurology coverage at [facility].  Craig Knight  is a 55 y.o. male, HTN, DM-2, diastolic CHF, YCX-4G, COPD , with  recent hospitalization 7/13 with acute right PCA territory infarct with residual mild right side weakness,, left hemianopsia, right facial droop, patient presents to ED initially with complaints of shortness of breath, as well he does report worsening right side weakness, but report it had mainly worsened this morning, patient received Solu-Medrol, nebulizer treatment, currently denies any respiratory symptoms, denies any recent falls, headache, change in vision, patient report he is compliant with his medication including statin, aspirin and Brilinta, he denies any fever, chills, or cough. - in ED his work-up significant for creatinine of 2 around his baseline, high-sensitivity troponin of 33, he denies any chest pain, he has mild leukocytosis at 12.8, he is afebrile, he had MRI brain/MRA head and neck significant for diffuse intracranial atherosclerosis disease, moderate to severe, with occlusion of right PCA, severe stenosis of left PCA, MRI of brain showing acute left basal ganglia infarct, and evolving subacute posterior circulation infarct without extension, ED physician discussed with neurology at [facility] Dr. Leonel Ramsay who requested admission for work-up at [facility] as no neurology coverage at [facility].  MRI: Brain WO Contrast:  IMPRESSION: 1. Acute left basal ganglia infarct. 2. Evolving subacute posterior circulation infarcts without extension from the 09/07/2020 MRI. 3. 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  He underwent a TEE on 09/18/2020, by Dr Debara Pickett 1. Left ventricular ejection fraction, by estimation, is 60 to 65%. The left ventricle has normal function. The left ventricle has no regional wall motion abnormalities. There is severe left ventricular hypertrophy. 2. Right ventricular systolic function is normal. The right ventricular size is normal. 3. No left atrial/left atrial appendage thrombus was detected. 4.  The mitral valve is  grossly normal. Trivial mitral valve regurgitation. 5. The aortic valve is tricuspid. Aortic valve regurgitation is mild. Conclusion(s)/Recommendation(s): No LA/LAA thrombus identified. Negative bubble study for interatrial shunt. No intracardiac source of embolism detected on this on this transesophageal echocardiogram. Left Ventricle: Left ventricular ejection fraction, by estimation, is 60 to 65%. The left ventricle has normal function. The left ventricle has no regional wall motion abnormalities. The left ventricular internal cavity size was normal in size. There is severe left ventricular hypertrophy. Right Ventricle: The right ventricular size is normal. No increase in right ventricular wall thickness. Right ventricular systolic function is normal. TEE Report Patient Name: Craig Knight Date of Exam: 09/18/2020 Medical Rec #: [id number] Height: 67.0 in Accession #: [id number] Weight: 225.0 lb Date of Birth: [birth year] BSA: 2.126 m Patient Age: 4 years BP: 122/64 mmHg Patient Gender: M HR: 78 bpm. Exam Location: Inpatient Procedure: Transesophageal Echo, Cardiac Doppler, Color Doppler and Saline Contrast Bubble Study Indications: CVA History: Patient has prior history of Echocardiogram examinations, most recent 09/07/2020. CHF, COPD and Stroke, Signs/Symptoms:CKD; Risk Factors:Hypertension, Diabetes and Current Smoker. Sonographer: Dustin Flock Referring Phys: Lyman Bishop MD Diagnosing Phys: Lyman Bishop MD IMPRESSIONS:  1. Left ventricular ejection fraction, by estimation, is 60 to 65%. The left ventricle has normal function. The left ventricle has no regional wall motion abnormalities. There is severe left ventricular hypertrophy. 2. Right ventricular systolic function is normal. The right ventricular size is normal. 3. No left atrial/left atrial appendage thrombus was detected. 4. The mitral valve is grossly normal. Trivial mitral valve regurgitation. 5. The aortic  valve is tricuspid. Aortic valve regurgitation is mild. Conclusion(s)/Recommendation(s): No LA/LAA thrombus identified. Negative bubble study for interatrial shunt. No intracardiac source of embolism detected on this on this transesophageal echocardiogram. Left Ventricle: Left ventricular ejection fraction, by estimation, is 60 to 65%. The left ventricle has normal function. The left ventricle has no regional wall motion abnormalities. The left ventricular internal cavity size was normal in size. There is severe left ventricular hypertrophy. Right Ventricle: The right ventricular size is normal. No increase in right ventricular wall thickness. Right ventricular systolic function is normal. TRANSESOPHOGEAL ECHO REPORT Patient Name: Craig Knight Date of Exam: 09/18/2020 Medical Rec #: [id number] Height: 67.0 in Accession #: [id number] Weight: 225.0 lb Date of Birth: 04-18-1965 BSA: 2.126 m Patient Age: 35 years BP: 122/64 mmHg Patient Gender: M HR: 78 bpm. Exam Location: Inpatient Procedure: Transesophageal Echo, Cardiac Doppler, Color Doppler and Saline Contrast Bubble Study Indications: CVA History: Patient has prior history of Echocardiogram examinations, most recent 09/07/2020. CHF, COPD and Stroke, Signs/Symptoms:CKD; Risk Factors:Hypertension, Diabetes and Current Smoker. Sonographer: Dustin Flock Referring Phys: Lyman Bishop MD Diagnosing Phys: Lyman Bishop MD IMPRESSIONS  1. Left ventricular ejection fraction, by estimation, is 60 to 65%. The  left ventricle has normal function. The left ventricle has no regional  wall motion abnormalities. There is severe left ventricular hypertrophy.   2. Right ventricular systolic function is normal. The right ventricular  size is normal.   3. No left atrial/left atrial appendage thrombus was detected.   4. The mitral valve is grossly normal. Trivial mitral valve  regurgitation.   5. The aortic valve is tricuspid. Aortic valve  regurgitation is mild.   Conclusion(s)/Recommendation(s): No LA/LAA thrombus identified. Negative  bubble study for interatrial shunt. No intracardiac source of embolism  detected on this on this transesophageal echocardiogram.  FINDINGS Final Page 1 of 2 Left Atrium: Left atrial size was normal  in size. No left atrial/left atrial appendage thrombus was detected. Right Atrium: Right atrial size was normal in size. Pericardium: There is no evidence of pericardial effusion. Mitral Valve: The mitral valve is grossly normal. Trivial mitral valve regurgitation. Tricuspid Valve: The tricuspid valve is grossly normal. Tricuspid valve regurgitation is trivial. Aortic Valve: The aortic valve is tricuspid. Aortic valve regurgitation is mild. Pulmonic Valve: The pulmonic valve was grossly normal. Pulmonic valve regurgitation is trivial. Aorta: The aortic root and ascending aorta are structurally normal, with no evidence of dilitation. IAS/Shunts: No atrial level shunt detected by color flow Doppler. Agitated saline contrast was given intravenously to evaluate for intracardiac shunting. Lyman Bishop MD Electronically signed by Lyman Bishop MD Signature Date/Time: 09/18/2020/2:16:58 PM  He was discharged on aspirin and Plavix. Neurology Following.   Craig Knight was admitted to inpatient rehabilitation on 09/19/2020 and discharged home on 10/13/2020. He is receiving outpatient therapy at [facility]. He states he has occasional pain in his right hand. He rates his pain 4. He also reports he has a good appetite.   Pain Inventory Average Pain 4 Pain Right Now 4 My pain is tingling  LOCATION OF PAIN  fingers  BOWEL Number of stools per week: 1   BLADDER Normal   Mobility walk with assistance use a walker ability to climb steps?  no do you drive?  no use a wheelchair needs help with transfers  Function disabled: date disabled . I need assistance with the following:  dressing, meal  prep, household duties, and shopping  Neuro/Psych trouble walking anxiety  Prior Studies Any changes since last visit?  no  Physicians involved in your care Any changes since last visit?  no   Family History  Problem Relation Age of Onset   Stroke Mother    Heart attack Father 71       CABG   Hypertension Sister    Stroke Brother    Hypertension Brother    Stomach cancer Brother    Stroke Maternal Grandmother    Heart attack Maternal Grandfather    Heart attack Paternal Grandmother    Social History   Socioeconomic History   Marital status: Married    Spouse name: Not on file   Number of children: Not on file   Years of education: Not on file   Highest education level: Not on file  Occupational History   Not on file  Tobacco Use   Smoking status: Every Day    Packs/day: 0.25    Types: Cigarettes   Smokeless tobacco: Never   Tobacco comments:    Quitting per patient  Vaping Use   Vaping Use: Never used  Substance and Sexual Activity   Alcohol use: Yes    Alcohol/week: 0.0 standard drinks    Comment: occasionally   Drug use: No   Sexual activity: Yes    Birth control/protection: None  Other Topics Concern   Not on file  Social History Narrative   Not on file   Social Determinants of Health   Financial Resource Strain: Not on file  Food Insecurity: Not on file  Transportation Needs: Not on file  Physical Activity: Not on file  Stress: Not on file  Social Connections: Not on file   Past Surgical History:  Procedure Laterality Date   BUBBLE STUDY  09/18/2020   Procedure: BUBBLE STUDY;  Surgeon: Pixie Casino, MD;  Location: [place];  Service: Cardiovascular;;   COLONOSCOPY N/A 09/18/2016   Procedure: COLONOSCOPY;  Surgeon:  Fields, Marga Melnick, MD;  [facility];  Service: Endoscopy;  Laterality: N/A;  10:30 AM   lipoma removal     TEE WITHOUT CARDIOVERSION N/A 09/18/2020   Procedure: TRANSESOPHAGEAL ECHOCARDIOGRAM (TEE);  Surgeon:  Pixie Casino, MD;  Location: [facility];  Service: Cardiovascular;  Laterality: N/A;   Past Medical History:  Diagnosis Date   Asthma    Bronchitis    COPD (chronic obstructive pulmonary disease) ([place])    Diabetes mellitus without complication ([place])    Heart murmur 60/11/9321   soft systolic murmur 1/6   Hypertension    Sleep apnea    Stroke (HCC)    BP 118/83   Pulse 68   Temp 98.3 F (36.8 C)   Ht 5\' 8"  (1.727 m)   Wt 204 lb 9.6 oz (92.8 kg) Comment: (actual weight)wheel chair weight is 37 lb  SpO2 95%   BMI 31.11 kg/m   Opioid Risk Score:   Fall Risk Score:  `1  Depression screen PHQ 2/9  Depression screen [facility] 2/9 12/01/2020 07/03/2016 07/03/2016 07/03/2016  Decreased Interest 0 2 1 2   Down, Depressed, Hopeless 0 2 3 3   PHQ - 2 Score 0 4 4 5   Altered sleeping 0 3 - -  Tired, decreased energy 1 2 - -  Change in appetite 0 0 - -  Feeling bad or failure about yourself  0 0 - -  Trouble concentrating 0 0 - -  Moving slowly or fidgety/restless 0 0 - -  Suicidal thoughts 0 0 - -  PHQ-9 Score 1 9 - -     Review of Systems  Constitutional: Negative.   HENT: Negative.    Eyes: Negative.   Respiratory: Negative.    Cardiovascular: Negative.   Gastrointestinal:  Positive for constipation.  Endocrine: Negative.   Genitourinary: Negative.   Musculoskeletal:  Positive for gait problem.  Skin: Negative.   Allergic/Immunologic: Negative.   Neurological:        Tingling  Hematological:  Bruises/bleeds easily.       Plavix  Psychiatric/Behavioral:  The patient is nervous/anxious.   All other systems reviewed and are negative.     Objective:   Physical Exam Vitals and nursing note reviewed.  Constitutional:      Appearance: Normal appearance.  Cardiovascular:     Rate and Rhythm: Normal rate and regular rhythm.     Pulses: Normal pulses.     Heart sounds: Normal heart sounds.  Pulmonary:     Effort: Pulmonary effort is normal.     Breath sounds: Normal breath  sounds.  Musculoskeletal:     Cervical back: Normal range of motion and neck supple.     Comments: Normal Muscle Bulk and Muscle Testing Reveals:  Upper Extremities: Right: Upper Extremity: Paralysis Left Upper Extremity: Full ROM and Muscle Strength 5/5  Lower Extremities: Right Lower Extremity: Paralysis Left Lower Extremity: Full ROM and Muscle Strength 5/5 Arrived in Wheelchair      Skin:    General: Skin is warm and dry.  Neurological:     Mental Status: He is alert and oriented to person, place, and time.  Psychiatric:        Mood and Affect: Mood normal.        Behavior: Behavior normal.         Assessment & Plan:  Infarction of left basal ganglia; continue Outpatient Therapy at [facility]. Continue current medication regimen. Continue to monitor. He has a scheduled appointment with  Neurology.   Essential Hypertension : Continue current medication regimen. Continue to Monitor. PCP Following.   Diabetic peripheral Neuropathy. Continue current medication regimen. PCP Following. Continue to monitor.   F/U with Dr Letta Pate in 4- 6 weeks

## 2020-12-04 ENCOUNTER — Other Ambulatory Visit: Payer: Self-pay

## 2020-12-04 ENCOUNTER — Encounter (HOSPITAL_COMMUNITY): Payer: Self-pay | Admitting: Physical Therapy

## 2020-12-04 ENCOUNTER — Ambulatory Visit (HOSPITAL_COMMUNITY): Payer: Medicare Other

## 2020-12-04 ENCOUNTER — Ambulatory Visit (HOSPITAL_COMMUNITY): Payer: Medicare Other | Admitting: Physical Therapy

## 2020-12-04 ENCOUNTER — Encounter (HOSPITAL_COMMUNITY): Payer: Self-pay

## 2020-12-04 DIAGNOSIS — R2689 Other abnormalities of gait and mobility: Secondary | ICD-10-CM

## 2020-12-04 DIAGNOSIS — M79601 Pain in right arm: Secondary | ICD-10-CM

## 2020-12-04 DIAGNOSIS — I639 Cerebral infarction, unspecified: Secondary | ICD-10-CM | POA: Diagnosis not present

## 2020-12-04 DIAGNOSIS — M6281 Muscle weakness (generalized): Secondary | ICD-10-CM

## 2020-12-04 DIAGNOSIS — R29818 Other symptoms and signs involving the nervous system: Secondary | ICD-10-CM

## 2020-12-04 DIAGNOSIS — R278 Other lack of coordination: Secondary | ICD-10-CM

## 2020-12-04 DIAGNOSIS — R262 Difficulty in walking, not elsewhere classified: Secondary | ICD-10-CM

## 2020-12-04 NOTE — Therapy (Signed)
[place], [place], [address] Phone: [phone]   Fax:  [phone]  Physical Therapy Treatment  Patient Details  Name: Craig Knight MRN: [id number] Date of Birth: 1965-04-07 Referring Provider (PT): Alysia Penna MD   Encounter Date: 12/04/2020   PT End of Session - 12/04/20 1312     Visit Number 12    Number of Visits 18    Date for PT Re-Evaluation 12/26/20    Authorization Type [employer] Medicare    Progress Note Due on Visit 18    PT Start Time 1315   arrived late   PT Stop Time 1345    PT Time Calculation (min) 30 min    Equipment Utilized During Treatment Gait belt    Activity Tolerance Patient limited by fatigue;Patient tolerated treatment well    Behavior During Therapy [facility] for tasks assessed/performed             Past Medical History:  Diagnosis Date   Asthma    Bronchitis    COPD (chronic obstructive pulmonary disease) ([place])    Diabetes mellitus without complication ([place])    Heart murmur 53/74/8270   soft systolic murmur 1/6   Hypertension    Sleep apnea    Stroke [facility])     Past Surgical History:  Procedure Laterality Date   BUBBLE STUDY  09/18/2020   Procedure: BUBBLE STUDY;  Surgeon: Pixie Casino, MD;  Location: [facility];  Service: Cardiovascular;;   COLONOSCOPY N/A 09/18/2016   Procedure: COLONOSCOPY;  Surgeon: Danie Binder, MD;  [facility];  Service: Endoscopy;  Laterality: N/A;  10:30 AM   lipoma removal     TEE WITHOUT CARDIOVERSION N/A 09/18/2020   Procedure: TRANSESOPHAGEAL ECHOCARDIOGRAM (TEE);  Surgeon: Pixie Casino, MD;  Location: [facility] ENDOSCOPY;  Service: Cardiovascular;  Laterality: N/A;    There were no vitals filed for this visit.   Subjective Assessment - 12/04/20 1313     Subjective Reports improvement in swelling in RT foot. Dealing with ongoing weakness.    Patient is accompained by: Family member    Pertinent History CVA     Currently in Pain? Yes    Pain Score 6     Pain Location Arm    Pain Orientation Right                               [facility] Adult PT Treatment/Exercise - 12/04/20 0001       Knee/Hip Exercises: Standing   Gait Training 45 feet with HW and WC follow, cues for sequencing, Min A for RLE      Knee/Hip Exercises: Seated   Long Arc Quad Right;10 reps;AAROM   with NMES   Long Arc Quad Limitations 10" holds      Theme park manager RT quad    Warden/ranger Parameters RT quad; 10/10 cycle, 40 mA    Electrical Stimulation Goals Neuromuscular facilitation   Completes woth Long arc quads too                      PT Short Term Goals - 11/02/20 1127       PT SHORT TERM GOAL #1   Title Patient will be independent with initial HEP and self-management strategies to improve functional outcomes    Time 3  Period Weeks    Status On-going    Target Date 11/13/20               PT Long Term Goals - 11/02/20 1127       PT LONG TERM GOAL #1   Title Patient will be able to ambualte 150 feet using LRAD for improved funcitonal and household mobililty    Time 6    Period Weeks    Status On-going      PT LONG TERM GOAL #2   Title Patient will be able to perform bed mobility and functional transfers mod I    Time 8    Period Weeks    Status On-going                   Plan - 12/04/20 1402     Clinical Impression Statement Patient tolerated session well today. Introduced NMES to RT quad for improved muscle activation. Achieved muscle contraction and cued for co contracting with seated LAQ. Continued work on gait using hemi walker. Patient cued on sequencing of HW and RLE. Patient continues to be limited by RLE weakness and requires Min A for RLE placement during ambulation. Will continue to benefit from skilled therapy services to progress LE strength and gait  mechanics for improved functional mobility.    Comorbidities multiple CVAs    Examination-Activity Limitations Transfers;Locomotion Level    Examination-Participation Restrictions Community Activity;Driving;Cleaning    Stability/Clinical Decision Making Stable/Uncomplicated    Rehab Potential Fair    PT Frequency 2x / week    PT Duration 8 weeks    PT Treatment/Interventions ADLs/Self Care Home Management;Aquatic Therapy;DME Instruction;Gait training;Stair training;Functional mobility training;Therapeutic activities;Therapeutic exercise;Balance training;Neuromuscular re-education;Patient/family education;Orthotic Fit/Training;Passive range of motion;Wheelchair mobility training;Visual/perceptual remediation/compensation;Energy conservation;Spinal Manipulations;Splinting;Joint Manipulations;Taping;Vasopneumatic Device;Compression bandaging;Manual techniques;Dry needling;Cognitive remediation;Electrical Stimulation;Cryotherapy;Biofeedback;Moist Heat;Traction;Iontophoresis 4mg /ml Dexamethasone    PT Next Visit Plan Conitnue NMES as indicated. Gait with hemi walker. Progress funcitonal activity as tolerated    PT Home Exercise Plan Eval: seated march, LAQs with assist as needed 9/19 assisted heelslides with iso holds to fatigue    Consulted and Agree with Plan of Care Patient;Family member/caregiver    Family Member Consulted wife             Patient will benefit from skilled therapeutic intervention in order to improve the following deficits and impairments:  Abnormal gait, Difficulty walking, Decreased endurance, Decreased activity tolerance, Decreased balance, Decreased mobility, Decreased strength, Postural dysfunction  Visit Diagnosis: Other abnormalities of gait and mobility  Difficulty in walking, not elsewhere classified  Muscle weakness (generalized)     Problem List Patient Active Problem List   Diagnosis Date Noted   Dysphagia, post-stroke    Essential hypertension     Diabetic peripheral neuropathy (HCC)    Infarction of left basal ganglia ([place]) 09/19/2020   Acute CVA (cerebrovascular accident) ([place]) 09/06/2020   Stroke ([place]) 11/23/2018   Numbness 11/23/2018   CVA (cerebral vascular accident) ([place]) 11/23/2018   Lung nodule 11/17/2017   Chest pressure 11/22/2016   Shortness of breath 11/22/2016   Depression, major, single episode, moderate ([place]) 10/14/2016   Special screening for malignant neoplasms, colon    Chronic obstructive pulmonary disease ([place]) 06/08/2016   Hypertensive emergency 02/16/2016   Hypertensive urgency 06/07/2015   CKD (chronic kidney disease), stage III ([place]) 06/07/2015   Pain in the chest    Elevated troponin    Chronic diastolic CHF (congestive heart failure) ([place]) 09/17/2013   DM type 2 (diabetes mellitus, type  2) ([place]) 09/15/2013   Chest pain 08/01/2011   HTN (hypertension) 08/01/2011   Chest pain 07/22/2011   Renal insufficiency 07/22/2011   Tobacco abuse 07/22/2011   Hypertension    2:05 PM, 12/04/20 Josue Hector PT DPT  Physical Therapist with [facility]  [phone] [place], [place], [address] Phone: [phone]   Fax:  [phone]  Name: JAVONI LUCKEN MRN: [id number] Date of Birth: 10-04-1965

## 2020-12-04 NOTE — Therapy (Signed)
[place], [place], [address] Phone: [phone]   Fax:  [phone]  Occupational Therapy Treatment  Patient Details  Name: Craig Knight MRN: [id number] Date of Birth: July 28, 1965 Referring Provider (OT): Lauraine Rinne, PA-C   Encounter Date: 12/04/2020   OT End of Session - 12/04/20 1632     Visit Number 12    Number of Visits 17    Date for OT Re-Evaluation 12/20/20    Authorization Type UHC Medicare; $30 copay    Progress Note Due on Visit 63    OT Start Time 1345    OT Stop Time 1423    OT Time Calculation (min) 38 min    Activity Tolerance Patient tolerated treatment well    Behavior During Therapy [facility] for tasks assessed/performed             Past Medical History:  Diagnosis Date   Asthma    Bronchitis    COPD (chronic obstructive pulmonary disease) ([place])    Diabetes mellitus without complication ([place])    Heart murmur 19/37/9024   soft systolic murmur 1/6   Hypertension    Sleep apnea    Stroke [facility])     Past Surgical History:  Procedure Laterality Date   BUBBLE STUDY  09/18/2020   Procedure: BUBBLE STUDY;  Surgeon: Pixie Casino, MD;  Location: [place];  Service: Cardiovascular;;   COLONOSCOPY N/A 09/18/2016   Procedure: COLONOSCOPY;  Surgeon: Danie Binder, MD;  [facility];  Service: Endoscopy;  Laterality: N/A;  10:30 AM   lipoma removal     TEE WITHOUT CARDIOVERSION N/A 09/18/2020   Procedure: TRANSESOPHAGEAL ECHOCARDIOGRAM (TEE);  Surgeon: Pixie Casino, MD;  Location: [facility] ENDOSCOPY;  Service: Cardiovascular;  Laterality: N/A;    There were no vitals filed for this visit.   Subjective Assessment - 12/04/20 1625     Subjective  S: I can't bend my elbow like used to so my right hand touches my shoulder.    Patient is accompanied by: Family member   Wife   Currently in Pain? Yes    Pain Score 8     Pain Location Elbow   shoulder   Pain Orientation  Right    Pain Descriptors / Indicators Sore;Tightness    Pain Type Acute pain    Pain Onset Today    Pain Frequency Intermittent    Aggravating Factors  when attempting to passively stretch elbow into flexion or extension    Pain Relieving Factors Took tylenol today. Helped some.    Effect of Pain on Daily Activities moderate effect    Multiple Pain Sites No                [facility] OT Assessment - 12/04/20 1627       Assessment   Medical Diagnosis s/p CVA      Precautions   Precautions Fall;Other (comment)    Precaution Comments right side hemiparesis                      OT Treatments/Exercises (OP) - 12/04/20 1627       Exercises   Exercises Elbow;Shoulder      Elbow Exercises   Elbow Extension Seated;PROM;10 reps    Other elbow exercises seated, elbow flexion, 10X, P/ROM      Neurological Re-education Exercises   Shoulder Flexion Self ROM;5 reps;Seated      Manual Therapy   Manual Therapy  Myofascial release;Taping    Manual therapy comments Kinesiotape applied to right shoulder for [facility] mechanical correction. Manual therapy completed prior to exercises.    Myofascial Release Myofascial relase completed to right elbow, right upper arm, upper trapezius, and scapularis region.                      OT Short Term Goals - 11/28/20 1558       OT SHORT TERM GOAL #1   Title Pt and caregiver will be educated on HEP to improve independence in ADL completion.    Time 4    Period Weeks    Target Date 11/25/20      OT SHORT TERM GOAL #2   Title Pt will demonstrate correct completion of weightbearing techniques to support nerve reinervation and decrease shoulder subluxation.    Time 4    Period Weeks    Status On-going      OT SHORT TERM GOAL #3   Title Pt will be educated on and demonstrate proficiency in completion of adaptive strategies and techniques to improve independence in ADLs such as dressing, bathing, and simple meal preparation  tasks.    Time 4    Period Weeks      OT SHORT TERM GOAL #4   Title Pt will increase P/ROM to [facility] to improve ability to complete RUE stretching tasks and decrease pain with functional tasks.    Time 4    Period Weeks    Status On-going      OT SHORT TERM GOAL #5   Title Pt and caregiver will demonstrate understanding of orthotics wear and donning/doffing such as sling and/or splinting to support RUE during ADLs and mobility tasks.    Time 4    Period Weeks    Status On-going      OT SHORT TERM GOAL #6   Title Patient will present with 1/5 right UE shoulder for flexion, extension, and scapular elevation which will allow patient to begin using his RUE as stabilizer during self care tasks as able.    Time 4    Period Weeks    Status On-going    Target Date 12/20/20               OT Long Term Goals - 11/22/20 1033       OT LONG TERM GOAL #1   Title -                   Plan - 12/04/20 1633     Clinical Impression Statement A: With reports of increased right elbow pain, myofascial release was completed to right elbow and right shoulder region. Max fascial restrictions noted around shoulder joint into the upper trapezius. Pt responded well to technique. Discussed use of heating pad at home to assist with muscle tightness. Re-applied kinsiotape to right shoulder to help increase support of the shoulder joint.    Body Structure / Function / Physical Skills ADL;Dexterity;Flexibility;ROM;Strength;Vision;Balance;Coordination;Edema;FMC;IADL;Tone;UE functional use;Sensation;Endurance;Decreased knowledge of precautions;Body mechanics;Decreased knowledge of use of DME;GMC;Mobility;Proprioception;Pain    Plan P: Myofascial release to right elbow and shoulder with patient on semi reclined/seated mat table.    Consulted and Agree with Plan of Care Patient             Patient will benefit from skilled therapeutic intervention in order to improve the following deficits and  impairments:   Body Structure / Function / Physical Skills: ADL, Dexterity, Flexibility, ROM, Strength, Vision, Balance, Coordination, Edema,  [place], IADL, Tone, UE functional use, Sensation, Endurance, Decreased knowledge of precautions, Body mechanics, Decreased knowledge of use of DME, GMC, Mobility, Proprioception, Pain       Visit Diagnosis: Other symptoms and signs involving the nervous system  Other lack of coordination  Pain in right arm    Problem List Patient Active Problem List   Diagnosis Date Noted   Dysphagia, post-stroke    Essential hypertension    Diabetic peripheral neuropathy ([place])    Infarction of left basal ganglia ([place]) 09/19/2020   Acute CVA (cerebrovascular accident) ([place]) 09/06/2020   Stroke ([place]) 11/23/2018   Numbness 11/23/2018   CVA (cerebral vascular accident) ([place]) 11/23/2018   Lung nodule 11/17/2017   Chest pressure 11/22/2016   Shortness of breath 11/22/2016   Depression, major, single episode, moderate ([place]) 10/14/2016   Special screening for malignant neoplasms, colon    Chronic obstructive pulmonary disease ([place]) 06/08/2016   Hypertensive emergency 02/16/2016   Hypertensive urgency 06/07/2015   CKD (chronic kidney disease), stage III ([place]) 06/07/2015   Pain in the chest    Elevated troponin    Chronic diastolic CHF (congestive heart failure) ([place]) 09/17/2013   DM type 2 (diabetes mellitus, type 2) ([place]) 09/15/2013   Chest pain 08/01/2011   HTN (hypertension) 08/01/2011   Chest pain 07/22/2011   Renal insufficiency 07/22/2011   Tobacco abuse 07/22/2011   Hypertension     Ailene Ravel, OTR/L,CBIS  [phone]  12/04/2020, 4:51 PM  [place] [address] [place], [place], [address] Phone: [phone]   Fax:  [phone]  Name: Craig Knight MRN: [id number] Date of Birth: 18-Mar-1965

## 2020-12-06 ENCOUNTER — Ambulatory Visit (HOSPITAL_COMMUNITY): Payer: Medicare Other

## 2020-12-06 ENCOUNTER — Ambulatory Visit (HOSPITAL_COMMUNITY): Payer: Medicare Other | Admitting: Physical Therapy

## 2020-12-06 ENCOUNTER — Encounter (HOSPITAL_COMMUNITY): Payer: Self-pay | Admitting: Physical Therapy

## 2020-12-06 ENCOUNTER — Other Ambulatory Visit: Payer: Self-pay

## 2020-12-06 DIAGNOSIS — M6281 Muscle weakness (generalized): Secondary | ICD-10-CM

## 2020-12-06 DIAGNOSIS — R262 Difficulty in walking, not elsewhere classified: Secondary | ICD-10-CM

## 2020-12-06 DIAGNOSIS — I639 Cerebral infarction, unspecified: Secondary | ICD-10-CM | POA: Diagnosis not present

## 2020-12-06 DIAGNOSIS — R278 Other lack of coordination: Secondary | ICD-10-CM

## 2020-12-06 DIAGNOSIS — R2689 Other abnormalities of gait and mobility: Secondary | ICD-10-CM

## 2020-12-06 DIAGNOSIS — R29818 Other symptoms and signs involving the nervous system: Secondary | ICD-10-CM

## 2020-12-06 DIAGNOSIS — M79601 Pain in right arm: Secondary | ICD-10-CM

## 2020-12-06 NOTE — Therapy (Signed)
[place], [place], [address] Phone: [phone]   Fax:  [phone]  Physical Therapy Treatment  Patient Details  Name: Craig Knight MRN: [id number] Date of Birth: 07/11/1965 Referring Provider (PT): Alysia Penna MD   Encounter Date: 12/06/2020   PT End of Session - 12/06/20 1355     Visit Number 13    Number of Visits 18    Date for PT Re-Evaluation 12/26/20    Authorization Type UHC Medicare    Progress Note Due on Visit 18    PT Start Time 1350    PT Stop Time 1428    PT Time Calculation (min) 38 min    Equipment Utilized During Treatment Gait belt    Activity Tolerance Patient limited by fatigue;Patient tolerated treatment well    Behavior During Therapy [facility] for tasks assessed/performed             Past Medical History:  Diagnosis Date   Asthma    Bronchitis    COPD (chronic obstructive pulmonary disease) ([place])    Diabetes mellitus without complication ([place])    Heart murmur 46/50/3546   soft systolic murmur 1/6   Hypertension    Sleep apnea    Stroke [facility])     Past Surgical History:  Procedure Laterality Date   BUBBLE STUDY  09/18/2020   Procedure: BUBBLE STUDY;  Surgeon: Pixie Casino, MD;  Location: [place];  Service: Cardiovascular;;   COLONOSCOPY N/A 09/18/2016   Procedure: COLONOSCOPY;  Surgeon: Danie Binder, MD;  [facility];  Service: Endoscopy;  Laterality: N/A;  10:30 AM   lipoma removal     TEE WITHOUT CARDIOVERSION N/A 09/18/2020   Procedure: TRANSESOPHAGEAL ECHOCARDIOGRAM (TEE);  Surgeon: Pixie Casino, MD;  Location: [facility] ENDOSCOPY;  Service: Cardiovascular;  Laterality: N/A;    There were no vitals filed for this visit.   Subjective Assessment - 12/06/20 1354     Subjective Patient says he has been walking at home with assist. Reports pain in his fingers today.    Patient is accompained by: Family member    Pertinent History CVA     Currently in Pain? Yes    Pain Score 3     Pain Location Finger (Comment which one)    Pain Orientation Right    Pain Descriptors / Indicators Aching;Tightness                               [facility] Adult PT Treatment/Exercise - 12/06/20 0001       Knee/Hip Exercises: Standing   Gait Training 45 ft HW and WC following   improved gait speed compared to last time   Other Standing Knee Exercises RT step taps 2" AAROM hip flexion x 15      Electrical Stimulation   Electrical Stimulation Location RT quad    Electrical Stimulation Action russian    Electrical Stimulation Parameters RT quad; 10/10 cycle; 52  mA    Electrical Stimulation Goals Neuromuscular facilitation                       PT Short Term Goals - 11/02/20 1127       PT SHORT TERM GOAL #1   Title Patient will be independent with initial HEP and self-management strategies to improve functional outcomes    Time 3    Period Weeks  Status On-going    Target Date 11/13/20               PT Long Term Goals - 11/02/20 1127       PT LONG TERM GOAL #1   Title Patient will be able to ambualte 150 feet using LRAD for improved funcitonal and household mobililty    Time 6    Period Weeks    Status On-going      PT LONG TERM GOAL #2   Title Patient will be able to perform bed mobility and functional transfers mod I    Time 8    Period Weeks    Status On-going                   Plan - 12/06/20 1428     Clinical Impression Statement Patient showing improvement in gait requiring less assist for RLE advance. Patient remains limited in RT hip flexion activation and requires manual assist for hip flexion. Showing some improvement in RT knee extension AROM with NMES and carry over with decrease assist required for RLE advance during ambulation with hemi walker. Patient will continue to benefit from skilled therapy services to progress LE strength and muscle activation for improved  functional mobility.    Comorbidities multiple CVAs    Examination-Activity Limitations Transfers;Locomotion Level    Examination-Participation Restrictions Community Activity;Driving;Cleaning    Stability/Clinical Decision Making Stable/Uncomplicated    Rehab Potential Fair    PT Frequency 2x / week    PT Duration 8 weeks    PT Treatment/Interventions ADLs/Self Care Home Management;Aquatic Therapy;DME Instruction;Gait training;Stair training;Functional mobility training;Therapeutic activities;Therapeutic exercise;Balance training;Neuromuscular re-education;Patient/family education;Orthotic Fit/Training;Passive range of motion;Wheelchair mobility training;Visual/perceptual remediation/compensation;Energy conservation;Spinal Manipulations;Splinting;Joint Manipulations;Taping;Vasopneumatic Device;Compression bandaging;Manual techniques;Dry needling;Cognitive remediation;Electrical Stimulation;Cryotherapy;Biofeedback;Moist Heat;Traction;Iontophoresis 4mg /ml Dexamethasone    PT Next Visit Plan Conitnue NMES as indicated. Gait with hemi walker. Progress funcitonal activity as tolerated    PT Home Exercise Plan Eval: seated march, LAQs with assist as needed 9/19 assisted heelslides with iso holds to fatigue    Consulted and Agree with Plan of Care Patient;Family member/caregiver    Family Member Consulted wife             Patient will benefit from skilled therapeutic intervention in order to improve the following deficits and impairments:  Abnormal gait, Difficulty walking, Decreased endurance, Decreased activity tolerance, Decreased balance, Decreased mobility, Decreased strength, Postural dysfunction  Visit Diagnosis: Other abnormalities of gait and mobility  Difficulty in walking, not elsewhere classified  Muscle weakness (generalized)     Problem List Patient Active Problem List   Diagnosis Date Noted   Dysphagia, post-stroke    Essential hypertension    Diabetic peripheral  neuropathy ([facility])    Infarction of left basal ganglia ([place]) 09/19/2020   Acute CVA (cerebrovascular accident) ([place]) 09/06/2020   Stroke ([place]) 11/23/2018   Numbness 11/23/2018   CVA (cerebral vascular accident) ([place]) 11/23/2018   Lung nodule 11/17/2017   Chest pressure 11/22/2016   Shortness of breath 11/22/2016   Depression, major, single episode, moderate ([place]) 10/14/2016   Special screening for malignant neoplasms, colon    Chronic obstructive pulmonary disease ([place]) 06/08/2016   Hypertensive emergency 02/16/2016   Hypertensive urgency 06/07/2015   CKD (chronic kidney disease), stage III ([place]) 06/07/2015   Pain in the chest    Elevated troponin    Chronic diastolic CHF (congestive heart failure) ([place]) 09/17/2013   DM type 2 (diabetes mellitus, type 2) ([place]) 09/15/2013   Chest pain 08/01/2011  HTN (hypertension) 08/01/2011   Chest pain 07/22/2011   Renal insufficiency 07/22/2011   Tobacco abuse 07/22/2011   Hypertension    2:38 PM, 12/06/20 Josue Hector PT DPT  Physical Therapist with [facility]  [phone] [place] [address] [place], [place], [address] Phone: [phone]   Fax:  [phone]  Name: ASANI MCBURNEY MRN: [id number] Date of Birth: 01/27/1966

## 2020-12-07 ENCOUNTER — Encounter (HOSPITAL_COMMUNITY): Payer: Self-pay

## 2020-12-07 NOTE — Therapy (Signed)
[place], [place], [address] Phone: [phone]   Fax:  [phone]  Occupational Therapy Treatment  Patient Details  Name: Craig Knight MRN: [id number] Date of Birth: 1965-08-18 Referring Provider (OT): Lauraine Rinne, PA-C   Encounter Date: 12/06/2020   OT End of Session - 12/07/20 1253     Visit Number 13    Number of Visits 17    Date for OT Re-Evaluation 12/20/20    Authorization Type UHC Medicare; $30 copay    Progress Note Due on Visit 5    OT Start Time 1435    OT Stop Time 1514    OT Time Calculation (min) 39 min    Activity Tolerance Patient tolerated treatment well    Behavior During Therapy [facility] for tasks assessed/performed             Past Medical History:  Diagnosis Date   Asthma    Bronchitis    COPD (chronic obstructive pulmonary disease) ([place])    Diabetes mellitus without complication ([place])    Heart murmur 54/65/6812   soft systolic murmur 1/6   Hypertension    Sleep apnea    Stroke [facility])     Past Surgical History:  Procedure Laterality Date   BUBBLE STUDY  09/18/2020   Procedure: BUBBLE STUDY;  Surgeon: Pixie Casino, MD;  Location: [place];  Service: Cardiovascular;;   COLONOSCOPY N/A 09/18/2016   Procedure: COLONOSCOPY;  Surgeon: Danie Binder, MD;  [facility];  Service: Endoscopy;  Laterality: N/A;  10:30 AM   lipoma removal     TEE WITHOUT CARDIOVERSION N/A 09/18/2020   Procedure: TRANSESOPHAGEAL ECHOCARDIOGRAM (TEE);  Surgeon: Pixie Casino, MD;  Location: [facility] ENDOSCOPY;  Service: Cardiovascular;  Laterality: N/A;    There were no vitals filed for this visit.   Subjective Assessment - 12/07/20 1246     Subjective  S: I sleep at the foot of the bed.    Currently in Pain? Yes    Pain Score 3     Pain Location Finger (Comment which one)   all digits   Pain Orientation Right    Pain Type Acute pain    Pain Onset Today    Pain Frequency  Intermittent    Aggravating Factors  nothing stated    Pain Relieving Factors Takes Tylenol. helps some.    Effect of Pain on Daily Activities moderate effect    Multiple Pain Sites No                [facility] OT Assessment - 12/07/20 1248       Assessment   Medical Diagnosis s/p CVA      Precautions   Precautions Fall;Other (comment)    Precaution Comments right side hemiparesis                      OT Treatments/Exercises (OP) - 12/07/20 1248       Bed Mobility   Bed Mobility Rolling Left    Rolling Left Moderate Assistance - Patient 50-74%      Exercises   Exercises Elbow;Shoulder      Shoulder Exercises: Stretch   Elbow Flexion PROM;10 reps;Supine      Elbow Exercises   Elbow Extension PROM;10 reps;Supine    Forearm Supination PROM;5 reps;Supine    Forearm Pronation PROM;5 reps;Supine      Neurological Re-education Exercises   Shoulder Flexion PROM;Supine;5 reps  Shoulder ABduction PROM;5 reps;Supine    Shoulder Protraction PROM;5 reps;Supine    Shoulder External Rotation PROM;5 reps;Supine    Wrist Flexion PROM;5 reps;Supine    Wrist Extension PROM;5 reps;Supine                    OT Education - 12/07/20 1252     Education Details Due to increased tone in right hand fingers discussed that patient will benefit from resting hand splint at night. Pt states that he has one. Asked him to bring his splint in at next session to assess if it is adequate for his needs. Education provided for positioning of the RUE when supine in bed to decrease amount of time spent in flexor synergy pattern.    Person(s) Educated Patient;Spouse    Methods Explanation    Comprehension Verbalized understanding              OT Short Term Goals - 11/28/20 1558       OT SHORT TERM GOAL #1   Title Pt and caregiver will be educated on HEP to improve independence in ADL completion.    Time 4    Period Weeks    Target Date 11/25/20      OT SHORT TERM  GOAL #2   Title Pt will demonstrate correct completion of weightbearing techniques to support nerve reinervation and decrease shoulder subluxation.    Time 4    Period Weeks    Status On-going      OT SHORT TERM GOAL #3   Title Pt will be educated on and demonstrate proficiency in completion of adaptive strategies and techniques to improve independence in ADLs such as dressing, bathing, and simple meal preparation tasks.    Time 4    Period Weeks      OT SHORT TERM GOAL #4   Title Pt will increase P/ROM to [facility] to improve ability to complete RUE stretching tasks and decrease pain with functional tasks.    Time 4    Period Weeks    Status On-going      OT SHORT TERM GOAL #5   Title Pt and caregiver will demonstrate understanding of orthotics wear and donning/doffing such as sling and/or splinting to support RUE during ADLs and mobility tasks.    Time 4    Period Weeks    Status On-going      OT SHORT TERM GOAL #6   Title Patient will present with 1/5 right UE shoulder for flexion, extension, and scapular elevation which will allow patient to begin using his RUE as stabilizer during self care tasks as able.    Time 4    Period Weeks    Status On-going    Target Date 12/20/20               OT Long Term Goals - 11/22/20 1033       OT LONG TERM GOAL #1   Title -                   Plan - 12/07/20 1305     Clinical Impression Statement A: Completed myofascial release to right upper arm and upper trapezius region to address moderate fascial restrictions. Noted that fascial restrictions are primarily in the upper trapezius this session with improvement noted in the shoulder region. Transitioned from sitting to full supine in segments using small high low table. Slowly decreased the level of incline until fully supine. Worked on bed mobility while rolling  supine to left side then transitioning into sitting. Education provided to patient and wife during bed mobility and  how to work on it at home.    Body Structure / Function / Physical Skills ADL;Dexterity;Flexibility;ROM;Strength;Vision;Balance;Coordination;Edema;FMC;IADL;Tone;UE functional use;Sensation;Endurance;Decreased knowledge of precautions;Body mechanics;Decreased knowledge of use of DME;GMC;Mobility;Proprioception;Pain    Plan P: Any updated HEP that can be provided to increase performance with daily tasks. Working on transitioning from sit to supine using larger mat table.    Consulted and Agree with Plan of Care Patient             Patient will benefit from skilled therapeutic intervention in order to improve the following deficits and impairments:   Body Structure / Function / Physical Skills: ADL, Dexterity, Flexibility, ROM, Strength, Vision, Balance, Coordination, Edema, FMC, IADL, Tone, UE functional use, Sensation, Endurance, Decreased knowledge of precautions, Body mechanics, Decreased knowledge of use of DME, GMC, Mobility, Proprioception, Pain       Visit Diagnosis: Other symptoms and signs involving the nervous system  Pain in right arm  Other lack of coordination    Problem List Patient Active Problem List   Diagnosis Date Noted   Dysphagia, post-stroke    Essential hypertension    Diabetic peripheral neuropathy (H[facility])    Infarction of left basal ganglia ([place]) 09/19/2020   Acute CVA (cerebrovascular accident) ([place]) 09/06/2020   Stroke ([place]) 11/23/2018   Numbness 11/23/2018   CVA (cerebral vascular accident) ([place]) 11/23/2018   Lung nodule 11/17/2017   Chest pressure 11/22/2016   Shortness of breath 11/22/2016   Depression, major, single episode, moderate ([place]) 10/14/2016   Special screening for malignant neoplasms, colon    Chronic obstructive pulmonary disease ([place]) 06/08/2016   Hypertensive emergency 02/16/2016   Hypertensive urgency 06/07/2015   CKD (chronic kidney disease), stage III ([place]) 06/07/2015   Pain in the chest    Elevated troponin    Chronic  diastolic CHF (congestive heart failure) ([place]) 09/17/2013   DM type 2 (diabetes mellitus, type 2) ([place]) 09/15/2013   Chest pain 08/01/2011   HTN (hypertension) 08/01/2011   Chest pain 07/22/2011   Renal insufficiency 07/22/2011   Tobacco abuse 07/22/2011   Hypertension     Ailene Ravel, OTR/L,CBIS  [phone]  12/07/2020, 1:18 PM  [place], [place], [address] Phone: [phone]   Fax:  [phone]  Name: Craig Knight MRN: [id number] Date of Birth: 07-Mar-1965

## 2020-12-11 ENCOUNTER — Encounter (HOSPITAL_COMMUNITY): Payer: Medicare Other

## 2020-12-11 ENCOUNTER — Encounter (HOSPITAL_COMMUNITY): Payer: Medicare Other | Admitting: Physical Therapy

## 2020-12-12 ENCOUNTER — Ambulatory Visit: Payer: Medicare Other | Admitting: Internal Medicine

## 2020-12-12 ENCOUNTER — Ambulatory Visit (HOSPITAL_COMMUNITY): Payer: Medicare Other

## 2020-12-13 ENCOUNTER — Ambulatory Visit (HOSPITAL_COMMUNITY): Payer: Medicare Other | Admitting: Physical Therapy

## 2020-12-13 ENCOUNTER — Encounter (HOSPITAL_COMMUNITY): Payer: Self-pay

## 2020-12-13 ENCOUNTER — Encounter (HOSPITAL_COMMUNITY): Payer: Self-pay | Admitting: Physical Therapy

## 2020-12-13 ENCOUNTER — Other Ambulatory Visit: Payer: Self-pay

## 2020-12-13 ENCOUNTER — Ambulatory Visit (HOSPITAL_COMMUNITY): Payer: Medicare Other

## 2020-12-13 DIAGNOSIS — R29818 Other symptoms and signs involving the nervous system: Secondary | ICD-10-CM

## 2020-12-13 DIAGNOSIS — M6281 Muscle weakness (generalized): Secondary | ICD-10-CM

## 2020-12-13 DIAGNOSIS — R262 Difficulty in walking, not elsewhere classified: Secondary | ICD-10-CM

## 2020-12-13 DIAGNOSIS — I639 Cerebral infarction, unspecified: Secondary | ICD-10-CM | POA: Diagnosis not present

## 2020-12-13 DIAGNOSIS — R278 Other lack of coordination: Secondary | ICD-10-CM

## 2020-12-13 NOTE — Therapy (Signed)
[place], [place], [address] Phone: [phone]   Fax:  [phone]  Physical Therapy Treatment  Patient Details  Name: Craig Knight MRN: [id number] Date of Birth: 25-Dec-1965 Referring Provider (PT): Alysia Penna MD   Encounter Date: 12/13/2020   PT End of Session - 12/13/20 1345     Visit Number 14    Number of Visits 18    Date for PT Re-Evaluation 12/26/20    Authorization Type UHC Medicare    Progress Note Due on Visit 18    PT Start Time 1345    PT Stop Time 1430    PT Time Calculation (min) 45 min    Equipment Utilized During Treatment Gait belt    Activity Tolerance Patient limited by fatigue;Patient tolerated treatment well    Behavior During Therapy [facility] for tasks assessed/performed             Past Medical History:  Diagnosis Date   Asthma    Bronchitis    COPD (chronic obstructive pulmonary disease) ([place])    Diabetes mellitus without complication ([place])    Heart murmur 24/26/8341   soft systolic murmur 1/6   Hypertension    Sleep apnea    Stroke [facility])     Past Surgical History:  Procedure Laterality Date   BUBBLE STUDY  09/18/2020   Procedure: BUBBLE STUDY;  Surgeon: Pixie Casino, MD;  Location: [place];  Service: Cardiovascular;;   COLONOSCOPY N/A 09/18/2016   Procedure: COLONOSCOPY;  Surgeon: Danie Binder, MD;  [facility];  Service: Endoscopy;  Laterality: N/A;  10:30 AM   lipoma removal     TEE WITHOUT CARDIOVERSION N/A 09/18/2020   Procedure: TRANSESOPHAGEAL ECHOCARDIOGRAM (TEE);  Surgeon: Pixie Casino, MD;  Location: [facility] ENDOSCOPY;  Service: Cardiovascular;  Laterality: N/A;    There were no vitals filed for this visit.   Subjective Assessment - 12/13/20 1349     Subjective Patient says he has been working his legs. Has been walking more at home using hemi walker.    Patient is accompained by: Family member    Pertinent History CVA     Currently in Pain? Yes    Pain Score 2     Pain Location Arm    Pain Orientation Right    Pain Descriptors / Indicators Aching    Pain Type Acute pain                               [facility] Adult PT Treatment/Exercise - 12/13/20 0001       Knee/Hip Exercises: Standing   Gait Training 98 feet with hemi walker then 25 feet with hemi walker, WC follow and RLE assist throughout    Other Standing Knee Exercises RLE step taps 2 inch x 5 with AAROM for RLE (still difficult per lack of RT hip activation)    Other Standing Knee Exercises sidestepping in bars 5RT with AAROM for RLE      Acupuncturist Stimulation Location RT quad    Electrical Stimulation Action Russian stim    Electrical Stimulation Parameters 74mA 10/10 sec, 50% duty cycle    Printmaker Goals Neuromuscular facilitation   performed with LAQs                      PT Short Term Goals - 11/02/20 1127  PT SHORT TERM GOAL #1   Title Patient will be independent with initial HEP and self-management strategies to improve functional outcomes    Time 3    Period Weeks    Status On-going    Target Date 11/13/20               PT Long Term Goals - 11/02/20 1127       PT LONG TERM GOAL #1   Title Patient will be able to ambualte 150 feet using LRAD for improved funcitonal and household mobililty    Time 6    Period Weeks    Status On-going      PT LONG TERM GOAL #2   Title Patient will be able to perform bed mobility and functional transfers mod I    Time 8    Period Weeks    Status On-going                   Plan - 12/13/20 1449     Clinical Impression Statement Patient showing overall increased activity tolerance and able to ambulate farther distance with assist. Some improvement in muscle activation with NMES during LAQs. Patient still limited by trace activation of RT hip flexors which are limiting ability to advance step during  gait without assist. Patient will continue to benefit from skilled therapy services to improve activity tolerance and functional LE strength for improved functional mobility and reduced burden of care.    Comorbidities multiple CVAs    Examination-Activity Limitations Transfers;Locomotion Level    Examination-Participation Restrictions Community Activity;Driving;Cleaning    Stability/Clinical Decision Making Stable/Uncomplicated    Rehab Potential Fair    PT Frequency 2x / week    PT Duration 8 weeks    PT Treatment/Interventions ADLs/Self Care Home Management;Aquatic Therapy;DME Instruction;Gait training;Stair training;Functional mobility training;Therapeutic activities;Therapeutic exercise;Balance training;Neuromuscular re-education;Patient/family education;Orthotic Fit/Training;Passive range of motion;Wheelchair mobility training;Visual/perceptual remediation/compensation;Energy conservation;Spinal Manipulations;Splinting;Joint Manipulations;Taping;Vasopneumatic Device;Compression bandaging;Manual techniques;Dry needling;Cognitive remediation;Electrical Stimulation;Cryotherapy;Biofeedback;Moist Heat;Traction;Iontophoresis 4mg /ml Dexamethasone    PT Next Visit Plan Conitnue NMES as indicated. Gait with hemi walker. Progress funcitonal activity as tolerated    PT Home Exercise Plan Eval: seated march, LAQs with assist as needed 9/19 assisted heelslides with iso holds to fatigue    Consulted and Agree with Plan of Care Patient;Family member/caregiver    Family Member Consulted wife             Patient will benefit from skilled therapeutic intervention in order to improve the following deficits and impairments:  Abnormal gait, Difficulty walking, Decreased endurance, Decreased activity tolerance, Decreased balance, Decreased mobility, Decreased strength, Postural dysfunction  Visit Diagnosis: Other symptoms and signs involving the nervous system  Difficulty in walking, not elsewhere  classified  Muscle weakness (generalized)     Problem List Patient Active Problem List   Diagnosis Date Noted   Dysphagia, post-stroke    Essential hypertension    Diabetic peripheral neuropathy (HCC)    Infarction of left basal ganglia ([place]) 09/19/2020   Acute CVA (cerebrovascular accident) ([place]) 09/06/2020   Stroke ([place]) 11/23/2018   Numbness 11/23/2018   CVA (cerebral vascular accident) ([place]) 11/23/2018   Lung nodule 11/17/2017   Chest pressure 11/22/2016   Shortness of breath 11/22/2016   Depression, major, single episode, moderate ([place]) 10/14/2016   Special screening for malignant neoplasms, colon    Chronic obstructive pulmonary disease ([place]) 06/08/2016   Hypertensive emergency 02/16/2016   Hypertensive urgency 06/07/2015   CKD (chronic kidney disease), stage III ([place]) 06/07/2015   Pain in  the chest    Elevated troponin    Chronic diastolic CHF (congestive heart failure) ([place]) 09/17/2013   DM type 2 (diabetes mellitus, type 2) ([place]) 09/15/2013   Chest pain 08/01/2011   HTN (hypertension) 08/01/2011   Chest pain 07/22/2011   Renal insufficiency 07/22/2011   Tobacco abuse 07/22/2011   Hypertension    2:52 PM, 12/13/20 Josue Hector PT DPT  Physical Therapist with [facility]  [phone] [place], [place], [address] Phone: [phone]   Fax:  [phone]  Name: YOUSIF EDELSON MRN: [id number] Date of Birth: 02/20/1966

## 2020-12-13 NOTE — Therapy (Signed)
[place], [place], [address] Phone: [phone]   Fax:  [phone]  Occupational Therapy Treatment  Patient Details  Name: Craig Knight MRN: [id number] Date of Birth: 30-Apr-1965 Referring Provider (OT): Lauraine Rinne, PA-C   Encounter Date: 12/13/2020   OT End of Session - 12/13/20 1607     Visit Number 14    Number of Visits 17    Date for OT Re-Evaluation 12/20/20    Authorization Type UHC Medicare; $30 copay    Progress Note Due on Visit 19    OT Start Time 1430    OT Stop Time 1510    OT Time Calculation (min) 40 min    Activity Tolerance Patient tolerated treatment well    Behavior During Therapy [facility] for tasks assessed/performed             Past Medical History:  Diagnosis Date   Asthma    Bronchitis    COPD (chronic obstructive pulmonary disease) ([place])    Diabetes mellitus without complication ([place])    Heart murmur 74/25/9563   soft systolic murmur 1/6   Hypertension    Sleep apnea    Stroke [facility])     Past Surgical History:  Procedure Laterality Date   BUBBLE STUDY  09/18/2020   Procedure: BUBBLE STUDY;  Surgeon: Pixie Casino, MD;  Location: [place];  Service: Cardiovascular;;   COLONOSCOPY N/A 09/18/2016   Procedure: COLONOSCOPY;  Surgeon: Danie Binder, MD;  [facility];  Service: Endoscopy;  Laterality: N/A;  10:30 AM   lipoma removal     TEE WITHOUT CARDIOVERSION N/A 09/18/2020   Procedure: TRANSESOPHAGEAL ECHOCARDIOGRAM (TEE);  Surgeon: Pixie Casino, MD;  Location: [facility]COPY;  Service: Cardiovascular;  Laterality: N/A;    There were no vitals filed for this visit.   Subjective Assessment - 12/13/20 1544     Subjective  S: I'm tired.    Patient is accompanied by: Family member   Wife   Currently in Pain? Yes    Pain Score 2     Pain Location Arm    Pain Orientation Right    Pain Descriptors / Indicators Aching    Pain Type Acute pain     Pain Onset In the past 7 days    Pain Frequency Intermittent    Aggravating Factors  nothing stated    Pain Relieving Factors Takes Tylenol. Helps some.    Effect of Pain on Daily Activities moderate effect    Multiple Pain Sites No                [facility] OT Assessment - 12/13/20 1603       Assessment   Medical Diagnosis s/p CVA      Precautions   Precautions Fall;Other (comment)    Precaution Comments right side hemiparesis                      OT Treatments/Exercises (OP) - 12/13/20 1603       Exercises   Exercises Shoulder      Neurological Re-education Exercises   Other Exercises 1 Mirror therapy completed with Right hand/wrist. Used NMES initially to assist with wrist and finger extension followed by finger flexion into a fist. Completed active movement with left hand while watching extremity in the mirror. Once NMES was done, completed the following exercises 10X with the left hand while watching extremity in mirror: form a fist, finger  tapping, make an "O" with thumb and index finger, wrist extension, forearm pronation/supation.      Modalities   Modalities Chief Financial Officer Parameters Channel 1: wrist/finger extensors 45 mA CC Channel 2: finger flexion 44 mA CC 5/5 cycle    Electrical Stimulation Goals Neuromuscular facilitation                      OT Short Term Goals - 11/28/20 1558       OT SHORT TERM GOAL #1   Title Pt and caregiver will be educated on HEP to improve independence in ADL completion.    Time 4    Period Weeks    Target Date 11/25/20      OT SHORT TERM GOAL #2   Title Pt will demonstrate correct completion of weightbearing techniques to support nerve reinervation and decrease shoulder subluxation.    Time 4    Period Weeks    Status On-going      OT SHORT  TERM GOAL #3   Title Pt will be educated on and demonstrate proficiency in completion of adaptive strategies and techniques to improve independence in ADLs such as dressing, bathing, and simple meal preparation tasks.    Time 4    Period Weeks      OT SHORT TERM GOAL #4   Title Pt will increase P/ROM to [facility] to improve ability to complete RUE stretching tasks and decrease pain with functional tasks.    Time 4    Period Weeks    Status On-going      OT SHORT TERM GOAL #5   Title Pt and caregiver will demonstrate understanding of orthotics wear and donning/doffing such as sling and/or splinting to support RUE during ADLs and mobility tasks.    Time 4    Period Weeks    Status On-going      OT SHORT TERM GOAL #6   Title Patient will present with 1/5 right UE shoulder for flexion, extension, and scapular elevation which will allow patient to begin using his RUE as stabilizer during self care tasks as able.    Time 4    Period Weeks    Status On-going    Target Date 12/20/20               OT Long Term Goals - 11/22/20 1033       OT LONG TERM GOAL #1   Title -                   Plan - 12/13/20 1608     Clinical Impression Statement A: Session focused on use of NMES and mirror therapy to faciliate neuroplastic changes that promote RUE recovery. VC provided for form and technique. No active movement was noted in right hand when patient was attempting to form a fist and/or extend his fingers.    Body Structure / Function / Physical Skills ADL;Dexterity;Flexibility;ROM;Strength;Vision;Balance;Coordination;Edema;FMC;IADL;Tone;UE functional use;Sensation;Endurance;Decreased knowledge of precautions;Body mechanics;Decreased knowledge of use of DME;GMC;Mobility;Proprioception;Pain    Plan P: Continue with mirror therapy using NMES as previously.             Patient will benefit from skilled therapeutic intervention in order to improve the following deficits and  impairments:   Body Structure / Function / Physical Skills: ADL, Dexterity, Flexibility, ROM, Strength, Vision, Balance, Coordination, Edema,  [place], IADL, Tone, UE functional use, Sensation, Endurance, Decreased knowledge of precautions, Body mechanics, Decreased knowledge of use of DME, GMC, Mobility, Proprioception, Pain       Visit Diagnosis: Other symptoms and signs involving the nervous system  Other lack of coordination    Problem List Patient Active Problem List   Diagnosis Date Noted   Dysphagia, post-stroke    Essential hypertension    Diabetic peripheral neuropathy ([place])    Infarction of left basal ganglia ([place]) 09/19/2020   Acute CVA (cerebrovascular accident) ([place]) 09/06/2020   Stroke ([place]) 11/23/2018   Numbness 11/23/2018   CVA (cerebral vascular accident) ([place]) 11/23/2018   Lung nodule 11/17/2017   Chest pressure 11/22/2016   Shortness of breath 11/22/2016   Depression, major, single episode, moderate ([place]) 10/14/2016   Special screening for malignant neoplasms, colon    Chronic obstructive pulmonary disease ([place]) 06/08/2016   Hypertensive emergency 02/16/2016   Hypertensive urgency 06/07/2015   CKD (chronic kidney disease), stage III ([place]) 06/07/2015   Pain in the chest    Elevated troponin    Chronic diastolic CHF (congestive heart failure) ([place]) 09/17/2013   DM type 2 (diabetes mellitus, type 2) ([place]) 09/15/2013   Chest pain 08/01/2011   HTN (hypertension) 08/01/2011   Chest pain 07/22/2011   Renal insufficiency 07/22/2011   Tobacco abuse 07/22/2011   Hypertension     Ailene Ravel, OTR/L,CBIS  [phone]  12/13/2020, 4:55 PM  [place] [address] [place], [place], [address] Phone: [phone]   Fax:  [phone]  Name: Craig Knight MRN: [id number] Date of Birth: 10-11-1965

## 2020-12-14 ENCOUNTER — Encounter (HOSPITAL_COMMUNITY): Payer: Self-pay

## 2020-12-14 ENCOUNTER — Ambulatory Visit (HOSPITAL_COMMUNITY): Payer: Medicare Other

## 2020-12-14 DIAGNOSIS — R262 Difficulty in walking, not elsewhere classified: Secondary | ICD-10-CM

## 2020-12-14 DIAGNOSIS — R29818 Other symptoms and signs involving the nervous system: Secondary | ICD-10-CM

## 2020-12-14 DIAGNOSIS — M6281 Muscle weakness (generalized): Secondary | ICD-10-CM

## 2020-12-14 DIAGNOSIS — R2689 Other abnormalities of gait and mobility: Secondary | ICD-10-CM

## 2020-12-14 DIAGNOSIS — R278 Other lack of coordination: Secondary | ICD-10-CM

## 2020-12-14 DIAGNOSIS — I639 Cerebral infarction, unspecified: Secondary | ICD-10-CM | POA: Diagnosis not present

## 2020-12-14 NOTE — Therapy (Signed)
[place], [place], [address] Phone: [phone]   Fax:  [phone]  Occupational Therapy Treatment  Patient Details  Name: ARMANY MANO MRN: [id number] Date of Birth: 10-20-1965 Referring Provider (OT): Lauraine Rinne, PA-C   Encounter Date: 12/14/2020   OT End of Session - 12/14/20 2228     Visit Number 15    Number of Visits 17    Date for OT Re-Evaluation 12/20/20    Authorization Type UHC Medicare; $30 copay    Progress Note Due on Visit 54    OT Start Time 1115    OT Stop Time 1153    OT Time Calculation (min) 38 min    Activity Tolerance Patient tolerated treatment well    Behavior During Therapy [facility] for tasks assessed/performed             Past Medical History:  Diagnosis Date   Asthma    Bronchitis    COPD (chronic obstructive pulmonary disease) ([place])    Diabetes mellitus without complication ([place])    Heart murmur 02/77/4128   soft systolic murmur 1/6   Hypertension    Sleep apnea    Stroke [facility])     Past Surgical History:  Procedure Laterality Date   BUBBLE STUDY  09/18/2020   Procedure: BUBBLE STUDY;  Surgeon: Pixie Casino, MD;  Location: [place];  Service: Cardiovascular;;   COLONOSCOPY N/A 09/18/2016   Procedure: COLONOSCOPY;  Surgeon: Danie Binder, MD;  [facility];  Service: Endoscopy;  Laterality: N/A;  10:30 AM   lipoma removal     TEE WITHOUT CARDIOVERSION N/A 09/18/2020   Procedure: TRANSESOPHAGEAL ECHOCARDIOGRAM (TEE);  Surgeon: Pixie Casino, MD;  Location: [facility] ENDOSCOPY;  Service: Cardiovascular;  Laterality: N/A;    There were no vitals filed for this visit.   Subjective Assessment - 12/14/20 2223     Subjective  S: nothing new to report.    Patient is accompanied by: Family member   wife   Currently in Pain? No/denies                [facility] OT Assessment - 12/14/20 2223       Assessment   Medical Diagnosis s/p CVA       Precautions   Precautions Fall;Other (comment)    Precaution Comments right side hemiparesis                      OT Treatments/Exercises (OP) - 12/14/20 2223       Exercises   Exercises Hand      Neurological Re-education Exercises   Other Exercises 1 Mirror therapy completed with Right hand/wrist. Used NMES to assist with wrist and finger extension followed by finger flexion into a fist. Completed active movement with left hand while watching extremity in the mirror.    Other Exercises 2 Attempted active composite finger flexion and extension (open hand, close into a fist) in right hand while completing active movement with left hand.      Modalities   Modalities Teacher, English as a foreign language Location Right wrist/digit exsensors (channel 1) right digit flexors (channel 2)    Chartered certified accountant Russian, Education officer, community Stimulation Parameters channel 1: 42 mA CC channel 2: 38 mA CC 5/5    Electrical Stimulation Goals Neuromuscular facilitation  OT Short Term Goals - 11/28/20 1558       OT SHORT TERM GOAL #1   Title Pt and caregiver will be educated on HEP to improve independence in ADL completion.    Time 4    Period Weeks    Target Date 11/25/20      OT SHORT TERM GOAL #2   Title Pt will demonstrate correct completion of weightbearing techniques to support nerve reinervation and decrease shoulder subluxation.    Time 4    Period Weeks    Status On-going      OT SHORT TERM GOAL #3   Title Pt will be educated on and demonstrate proficiency in completion of adaptive strategies and techniques to improve independence in ADLs such as dressing, bathing, and simple meal preparation tasks.    Time 4    Period Weeks      OT SHORT TERM GOAL #4   Title Pt will increase P/ROM to [facility] to improve ability to complete RUE stretching tasks and decrease pain with functional  tasks.    Time 4    Period Weeks    Status On-going      OT SHORT TERM GOAL #5   Title Pt and caregiver will demonstrate understanding of orthotics wear and donning/doffing such as sling and/or splinting to support RUE during ADLs and mobility tasks.    Time 4    Period Weeks    Status On-going      OT SHORT TERM GOAL #6   Title Patient will present with 1/5 right UE shoulder for flexion, extension, and scapular elevation which will allow patient to begin using his RUE as stabilizer during self care tasks as able.    Time 4    Period Weeks    Status On-going    Target Date 12/20/20                  Plan - 12/14/20 2228     Clinical Impression Statement A: Continued with use of NMES and mirror therapy to faciliate neuroplastic changes VC for form and technique were provided. Still no active movement is seen when NMES is finished and attempting to form a fist and/or extend fingers.    Body Structure / Function / Physical Skills ADL;Dexterity;Flexibility;ROM;Strength;Vision;Balance;Coordination;Edema;FMC;IADL;Tone;UE functional use;Sensation;Endurance;Decreased knowledge of precautions;Body mechanics;Decreased knowledge of use of DME;GMC;Mobility;Proprioception;Pain    Plan P: Continue with NMES and mirror therapy.    Consulted and Agree with Plan of Care Patient             Patient will benefit from skilled therapeutic intervention in order to improve the following deficits and impairments:   Body Structure / Function / Physical Skills: ADL, Dexterity, Flexibility, ROM, Strength, Vision, Balance, Coordination, Edema, FMC, IADL, Tone, UE functional use, Sensation, Endurance, Decreased knowledge of precautions, Body mechanics, Decreased knowledge of use of DME, GMC, Mobility, Proprioception, Pain       Visit Diagnosis: Other symptoms and signs involving the nervous system  Other lack of coordination    Problem List Patient Active Problem List   Diagnosis Date Noted    Dysphagia, post-stroke    Essential hypertension    Diabetic peripheral neuropathy ([place])    Infarction of left basal ganglia ([place]) 09/19/2020   Acute CVA (cerebrovascular accident) ([place]) 09/06/2020   Stroke ([place]) 11/23/2018   Numbness 11/23/2018   CVA (cerebral vascular accident) ([place]) 11/23/2018   Lung nodule 11/17/2017   Chest pressure 11/22/2016   Shortness of breath 11/22/2016   Depression,  major, single episode, moderate ([place]) 10/14/2016   Special screening for malignant neoplasms, colon    Chronic obstructive pulmonary disease ([place]) 06/08/2016   Hypertensive emergency 02/16/2016   Hypertensive urgency 06/07/2015   CKD (chronic kidney disease), stage III ([place]) 06/07/2015   Pain in the chest    Elevated troponin    Chronic diastolic CHF (congestive heart failure) ([place]) 09/17/2013   DM type 2 (diabetes mellitus, type 2) ([place]) 09/15/2013   Chest pain 08/01/2011   HTN (hypertension) 08/01/2011   Chest pain 07/22/2011   Renal insufficiency 07/22/2011   Tobacco abuse 07/22/2011   Hypertension    Ailene Ravel, OTR/L,CBIS  [phone]  12/14/2020, 10:33 PM  [place] [address] [place], [place], [address] Phone: [phone]   Fax:  [phone]  Name: GREG CRATTY MRN: [id number] Date of Birth: [birth year]

## 2020-12-14 NOTE — Therapy (Signed)
[place], [place], [address] Phone: [phone]   Fax:  [phone]  Physical Therapy Treatment  Patient Details  Name: Craig Knight MRN: [id number] Date of Birth: 21-Oct-1965 Referring Provider (PT): Alysia Penna MD   Encounter Date: 12/14/2020   PT End of Session - 12/14/20 1338     Visit Number 15    Number of Visits 18    Date for PT Re-Evaluation 12/26/20    Authorization Type UHC Medicare    Progress Note Due on Visit 18    PT Start Time 1316    PT Stop Time 1358    PT Time Calculation (min) 42 min    Equipment Utilized During Treatment Gait belt    Activity Tolerance Patient limited by fatigue;Patient tolerated treatment well    Behavior During Therapy [facility] for tasks assessed/performed             Past Medical History:  Diagnosis Date   Asthma    Bronchitis    COPD (chronic obstructive pulmonary disease) ([place])    Diabetes mellitus without complication ([place])    Heart murmur 68/34/1962   soft systolic murmur 1/6   Hypertension    Sleep apnea    Stroke [facility])     Past Surgical History:  Procedure Laterality Date   BUBBLE STUDY  09/18/2020   Procedure: BUBBLE STUDY;  Surgeon: Pixie Casino, MD;  Location: [facility];  Service: Cardiovascular;;   COLONOSCOPY N/A 09/18/2016   Procedure: COLONOSCOPY;  Surgeon: Danie Binder, MD;  [facility];  Service: Endoscopy;  Laterality: N/A;  10:30 AM   lipoma removal     TEE WITHOUT CARDIOVERSION N/A 09/18/2020   Procedure: TRANSESOPHAGEAL ECHOCARDIOGRAM (TEE);  Surgeon: Pixie Casino, MD;  Location: [facility] ENDOSCOPY;  Service: Cardiovascular;  Laterality: N/A;    There were no vitals filed for this visit.   Subjective Assessment - 12/14/20 1337     Subjective Reports he has been using hemiwalker at home some, fearful of falling    Patient is accompained by: Family member    Pertinent History CVA    Patient Stated Goals To  walk, move my right leg    Currently in Pain? No/denies                               [facility] Adult PT Treatment/Exercise - 12/14/20 0001       Ambulation/Gait   Ambulation/Gait Yes    Ambulation/Gait Assistance 4: Min assist    Ambulation Distance (Feet) 84 Feet    Assistive device Hemi-walker    Gait Pattern Decreased hip/knee flexion - right;Decreased dorsiflexion - right;Decreased weight shift to right;Right flexed knee in stance;Poor foot clearance - right    Ambulation Surface Level;Indoor    Gait Comments Wife pushing WC behind for safety, able to demonstrate correct sequence wiht HW      Acupuncturist Location Reciprocal Rt quad and hamstring    Warden/ranger Parameters 87mA 10/20 s    Electrical Stimulation Goals Neuromuscular facilitation                       PT Short Term Goals - 11/02/20 1127       PT SHORT TERM GOAL #1   Title Patient will be independent with initial HEP and self-management strategies  to improve functional outcomes    Time 3    Period Weeks    Status On-going    Target Date 11/13/20               PT Long Term Goals - 11/02/20 1127       PT LONG TERM GOAL #1   Title Patient will be able to ambualte 150 feet using LRAD for improved funcitonal and household mobililty    Time 6    Period Weeks    Status On-going      PT LONG TERM GOAL #2   Title Patient will be able to perform bed mobility and functional transfers mod I    Time 8    Period Weeks    Status On-going                   Plan - 12/14/20 1840     Clinical Impression Statement Added reciprocal NMES to improve alternating knee extension then flexion to simulate walking, noted good contration wiht quad, minimal with hamstring movement.  Pt continues to present with trace activation of Rt hip flexion movements though able to complete gait with minimal  assistance required with advancing Rt LE during gait, increased need for assistance per fatigue.  Pt arrived without AFO on, encouraged to arrive to therapy wiht AD to reduce donning time.  Pt encouraged to complete more standing at home with AFO safely wiht hemiwalker and family present.  No reorts of pain through session.    Comorbidities multiple CVAs    Examination-Activity Limitations Transfers;Locomotion Level    Examination-Participation Restrictions Community Activity;Driving;Cleaning    Stability/Clinical Decision Making Stable/Uncomplicated    Clinical Decision Making Low    Rehab Potential Fair    PT Frequency 2x / week    PT Duration 8 weeks    PT Treatment/Interventions ADLs/Self Care Home Management;Aquatic Therapy;DME Instruction;Gait training;Stair training;Functional mobility training;Therapeutic activities;Therapeutic exercise;Balance training;Neuromuscular re-education;Patient/family education;Orthotic Fit/Training;Passive range of motion;Wheelchair mobility training;Visual/perceptual remediation/compensation;Energy conservation;Spinal Manipulations;Splinting;Joint Manipulations;Taping;Vasopneumatic Device;Compression bandaging;Manual techniques;Dry needling;Cognitive remediation;Electrical Stimulation;Cryotherapy;Biofeedback;Moist Heat;Traction;Iontophoresis 4mg /ml Dexamethasone    PT Next Visit Plan Conitnue NMES as indicated. Gait with hemi walker. Progress funcitonal activity as tolerated    PT Home Exercise Plan Eval: seated march, LAQs with assist as needed 9/19 assisted heelslides with iso holds to fatigue    Consulted and Agree with Plan of Care Patient;Family member/caregiver    Family Member Consulted wife             Patient will benefit from skilled therapeutic intervention in order to improve the following deficits and impairments:  Abnormal gait, Difficulty walking, Decreased endurance, Decreased activity tolerance, Decreased balance, Decreased mobility,  Decreased strength, Postural dysfunction  Visit Diagnosis: Muscle weakness (generalized)  Difficulty in walking, not elsewhere classified  Other abnormalities of gait and mobility     Problem List Patient Active Problem List   Diagnosis Date Noted   Dysphagia, post-stroke    Essential hypertension    Diabetic peripheral neuropathy (HCC)    Infarction of left basal ganglia ([place]) 09/19/2020   Acute CVA (cerebrovascular accident) ([place]) 09/06/2020   Stroke ([place]) 11/23/2018   Numbness 11/23/2018   CVA (cerebral vascular accident) ([place]) 11/23/2018   Lung nodule 11/17/2017   Chest pressure 11/22/2016   Shortness of breath 11/22/2016   Depression, major, single episode, moderate ([place]) 10/14/2016   Special screening for malignant neoplasms, colon    Chronic obstructive pulmonary disease ([place]) 06/08/2016   Hypertensive emergency 02/16/2016   Hypertensive urgency 06/07/2015  CKD (chronic kidney disease), stage III ([place]) 06/07/2015   Pain in the chest    Elevated troponin    Chronic diastolic CHF (congestive heart failure) ([place]) 09/17/2013   DM type 2 (diabetes mellitus, type 2) ([place]) 09/15/2013   Chest pain 08/01/2011   HTN (hypertension) 08/01/2011   Chest pain 07/22/2011   Renal insufficiency 07/22/2011   Tobacco abuse 07/22/2011   Hypertension    Ihor Austin, LPTA/CLT; CBIS [phone]  Aldona Lento, PTA 12/14/2020, 6:49 PM  [place], [place], [address] Phone: [phone]   Fax:  [phone]  Name: Craig Knight MRN: [id number] Date of Birth: 06-Jul-1965

## 2020-12-18 ENCOUNTER — Encounter (HOSPITAL_COMMUNITY): Payer: Medicare Other | Admitting: Physical Therapy

## 2020-12-18 ENCOUNTER — Ambulatory Visit (HOSPITAL_COMMUNITY): Payer: Medicare Other

## 2020-12-18 ENCOUNTER — Other Ambulatory Visit: Payer: Self-pay

## 2020-12-18 ENCOUNTER — Encounter (HOSPITAL_COMMUNITY): Payer: Self-pay

## 2020-12-18 DIAGNOSIS — I639 Cerebral infarction, unspecified: Secondary | ICD-10-CM | POA: Diagnosis not present

## 2020-12-18 DIAGNOSIS — R278 Other lack of coordination: Secondary | ICD-10-CM

## 2020-12-18 DIAGNOSIS — R29818 Other symptoms and signs involving the nervous system: Secondary | ICD-10-CM

## 2020-12-18 NOTE — Therapy (Signed)
[place], [place], [address] Phone: [phone]   Fax:  [phone]  Occupational Therapy Treatment  Patient Details  Name: Craig Knight MRN: [id number] Date of Birth: 06/17/1965 Referring Provider (OT): Lauraine Rinne, PA-C   Encounter Date: 12/18/2020   OT End of Session - 12/18/20 1642     Visit Number 16    Number of Visits 17    Date for OT Re-Evaluation 12/20/20    Authorization Type UHC Medicare; $30 copay    Progress Note Due on Visit 24    OT Start Time 1345    OT Stop Time 1425    OT Time Calculation (min) 40 min    Activity Tolerance Patient tolerated treatment well    Behavior During Therapy [facility] for tasks assessed/performed             Past Medical History:  Diagnosis Date   Asthma    Bronchitis    COPD (chronic obstructive pulmonary disease) ([place])    Diabetes mellitus without complication ([place])    Heart murmur 55/97/4163   soft systolic murmur 1/6   Hypertension    Sleep apnea    Stroke [facility])     Past Surgical History:  Procedure Laterality Date   BUBBLE STUDY  09/18/2020   Procedure: BUBBLE STUDY;  Surgeon: Pixie Casino, MD;  Location: [facility];  Service: Cardiovascular;;   COLONOSCOPY N/A 09/18/2016   Procedure: COLONOSCOPY;  Surgeon: Danie Binder, MD;  [facility];  Service: Endoscopy;  Laterality: N/A;  10:30 AM   lipoma removal     TEE WITHOUT CARDIOVERSION N/A 09/18/2020   Procedure: TRANSESOPHAGEAL ECHOCARDIOGRAM (TEE);  Surgeon: Pixie Casino, MD;  Location: [facility] ENDOSCOPY;  Service: Cardiovascular;  Laterality: N/A;    There were no vitals filed for this visit.   Subjective Assessment - 12/18/20 1639     Subjective  S: nothing new to report.    Patient is accompanied by: Family member   wife and Daughter   Currently in Pain? No/denies                [facility] OT Assessment - 12/18/20 1639       Assessment   Medical Diagnosis s/p CVA       Precautions   Precautions Fall;Other (comment)    Precaution Comments right side hemiparesis                      OT Treatments/Exercises (OP) - 12/18/20 1639       Exercises   Exercises Hand      Neurological Re-education Exercises   Other Exercises 1 Mirror therapy completed with Right hand/wrist. Used NMES to assist with wrist and finger extension followed by finger flexion into a fist. Completed active movement with left hand while watching extremity in the mirror.    Other Exercises 2 Attempted active composite finger flexion and extension (open hand, close into a fist) in right hand while completing active movement with left hand.      Modalities   Modalities Teacher, English as a foreign language Location Right wrist/digit exsensors (channel 1) right digit flexors (channel 2)    Printmaker Action Russian, Event organiser Parameters Channel 1: extensors 57 mA CC Channel 2: flexors 41 mA CC    Electrical Stimulation Goals Neuromuscular facilitation  OT Education - 12/18/20 1641     Education Details provided with print out of how to purchase a NMES unit for home use. Suggested completing 2-3 times a day for approximately 10 minutes each time.    Person(s) Educated Patient;Spouse    Methods Explanation;Handout    Comprehension Verbalized understanding              OT Short Term Goals - 11/28/20 1558       OT SHORT TERM GOAL #1   Title Pt and caregiver will be educated on HEP to improve independence in ADL completion.    Time 4    Period Weeks    Target Date 11/25/20      OT SHORT TERM GOAL #2   Title Pt will demonstrate correct completion of weightbearing techniques to support nerve reinervation and decrease shoulder subluxation.    Time 4    Period Weeks    Status On-going      OT SHORT TERM GOAL #3   Title Pt will be educated on and  demonstrate proficiency in completion of adaptive strategies and techniques to improve independence in ADLs such as dressing, bathing, and simple meal preparation tasks.    Time 4    Period Weeks      OT SHORT TERM GOAL #4   Title Pt will increase P/ROM to [facility] to improve ability to complete RUE stretching tasks and decrease pain with functional tasks.    Time 4    Period Weeks    Status On-going      OT SHORT TERM GOAL #5   Title Pt and caregiver will demonstrate understanding of orthotics wear and donning/doffing such as sling and/or splinting to support RUE during ADLs and mobility tasks.    Time 4    Period Weeks    Status On-going      OT SHORT TERM GOAL #6   Title Patient will present with 1/5 right UE shoulder for flexion, extension, and scapular elevation which will allow patient to begin using his RUE as stabilizer during self care tasks as able.    Time 4    Period Weeks    Status On-going    Target Date 12/20/20               OT Long Term Goals - 11/22/20 1033       OT LONG TERM GOAL #1   Title -                   Plan - 12/18/20 1643     Clinical Impression Statement A: Continued with use of NMES and mirror therapy to faciliate neuroplastic changes VC for form and technique were provided. Still no active movement is seen when NMES is finished and attempting to form a fist and/or extend fingers.    Body Structure / Function / Physical Skills ADL;Dexterity;Flexibility;ROM;Strength;Vision;Balance;Coordination;Edema;FMC;IADL;Tone;UE functional use;Sensation;Endurance;Decreased knowledge of precautions;Body mechanics;Decreased knowledge of use of DME;GMC;Mobility;Proprioception;Pain    Plan P: Reassessment with plan of discharge to start episodic care. Brandon from [employer] will be present to assess powered wheelchair needs.    Consulted and Agree with Plan of Care Patient    Family Member Consulted wife [name]             Patient will benefit from  skilled therapeutic intervention in order to improve the following deficits and impairments:   Body Structure / Function / Physical Skills: ADL, Dexterity, Flexibility, ROM, Strength, Vision, Balance, Coordination, Edema, FMC, IADL, Tone,  UE functional use, Sensation, Endurance, Decreased knowledge of precautions, Body mechanics, Decreased knowledge of use of DME, GMC, Mobility, Proprioception, Pain       Visit Diagnosis: Other symptoms and signs involving the nervous system  Other lack of coordination    Problem List Patient Active Problem List   Diagnosis Date Noted   Dysphagia, post-stroke    Essential hypertension    Diabetic peripheral neuropathy ([place])    Infarction of left basal ganglia ([place]) 09/19/2020   Acute CVA (cerebrovascular accident) ([place]) 09/06/2020   Stroke ([place]) 11/23/2018   Numbness 11/23/2018   CVA (cerebral vascular accident) ([place]) 11/23/2018   Lung nodule 11/17/2017   Chest pressure 11/22/2016   Shortness of breath 11/22/2016   Depression, major, single episode, moderate ([place]) 10/14/2016   Special screening for malignant neoplasms, colon    Chronic obstructive pulmonary disease ([place]) 06/08/2016   Hypertensive emergency 02/16/2016   Hypertensive urgency 06/07/2015   CKD (chronic kidney disease), stage III ([place]) 06/07/2015   Pain in the chest    Elevated troponin    Chronic diastolic CHF (congestive heart failure) ([place]) 09/17/2013   DM type 2 (diabetes mellitus, type 2) ([place]) 09/15/2013   Chest pain 08/01/2011   HTN (hypertension) 08/01/2011   Chest pain 07/22/2011   Renal insufficiency 07/22/2011   Tobacco abuse 07/22/2011   Hypertension     Ailene Ravel, OTR/L,CBIS  [phone]  12/18/2020, 4:44 PM  [place] [address] [place], [place], [address] Phone: [phone]   Fax:  [phone]  Name: Craig Knight MRN: [id number] Date of Birth: Jul 04, 1965

## 2020-12-20 ENCOUNTER — Ambulatory Visit (HOSPITAL_COMMUNITY): Payer: Medicare Other

## 2020-12-20 ENCOUNTER — Other Ambulatory Visit: Payer: Self-pay

## 2020-12-20 ENCOUNTER — Encounter (HOSPITAL_COMMUNITY): Payer: Self-pay

## 2020-12-20 DIAGNOSIS — R29818 Other symptoms and signs involving the nervous system: Secondary | ICD-10-CM

## 2020-12-20 DIAGNOSIS — R2689 Other abnormalities of gait and mobility: Secondary | ICD-10-CM

## 2020-12-20 DIAGNOSIS — R262 Difficulty in walking, not elsewhere classified: Secondary | ICD-10-CM

## 2020-12-20 DIAGNOSIS — R278 Other lack of coordination: Secondary | ICD-10-CM

## 2020-12-20 DIAGNOSIS — M79601 Pain in right arm: Secondary | ICD-10-CM

## 2020-12-20 DIAGNOSIS — M6281 Muscle weakness (generalized): Secondary | ICD-10-CM

## 2020-12-20 DIAGNOSIS — I639 Cerebral infarction, unspecified: Secondary | ICD-10-CM | POA: Diagnosis not present

## 2020-12-20 NOTE — Therapy (Signed)
[place], [place], [address] Phone: [phone]   Fax:  [phone]  Occupational Therapy Treatment/ Wheelchair Management   Patient Details  Name: Craig Knight MRN: [id number] Date of Birth: 22-May-1965 Referring Provider (OT): Lauraine Rinne, PA-C   Encounter Date: 12/20/2020   OT End of Session - 12/20/20 1552     Visit Number 17    Number of Visits 17    Authorization Type [employer] Medicare; $30 copay    Progress Note Due on Visit 19    OT Start Time 1300    OT Stop Time 1345    OT Time Calculation (min) 45 min    Activity Tolerance Patient tolerated treatment well    Behavior During Therapy [facility] for tasks assessed/performed             Past Medical History:  Diagnosis Date   Asthma    Bronchitis    COPD (chronic obstructive pulmonary disease) ([place])    Diabetes mellitus without complication ([place])    Heart murmur 22/48/2500   soft systolic murmur 1/6   Hypertension    Sleep apnea    Stroke [facility])     Past Surgical History:  Procedure Laterality Date   BUBBLE STUDY  09/18/2020   Procedure: BUBBLE STUDY;  Surgeon: Pixie Casino, MD;  Location: [facility];  Service: Cardiovascular;;   COLONOSCOPY N/A 09/18/2016   Procedure: COLONOSCOPY;  Surgeon: Danie Binder, MD;  Location: [facility];  Service: Endoscopy;  Laterality: N/A;  10:30 AM   lipoma removal     TEE WITHOUT CARDIOVERSION N/A 09/18/2020   Procedure: TRANSESOPHAGEAL ECHOCARDIOGRAM (TEE);  Surgeon: Pixie Casino, MD;  Location: [facility] ENDOSCOPY;  Service: Cardiovascular;  Laterality: N/A;    There were no vitals filed for this visit.   Subjective Assessment - 12/20/20 1437     Subjective  S: Follow up appointment with Dr. Letta Pate is not scheduled until beginning of [place].    Patient is accompanied by: Family member   Wife and [name] from [employer]   Currently in Pain? No/denies                [facility] OT Assessment -  12/20/20 1438       Assessment   Medical Diagnosis s/p CVA    Referring Provider (OT) Lauraine Rinne, PA-C    Onset Date/Surgical Date 09/15/20      Precautions   Precautions Fall;Other (comment)    Precaution Comments right side hemiparesis                        Mobility/Seating Evaluation    PATIENT INFORMATION: Name: Craig Knight DOB: 26-Apr-2065  Sex: Male Date seen: 12/20/20 Time: 1:00PM  Address:  [place], [place] [address] Physician: Rosita Fire, MD This evaluation/justification form will serve as the LMN for the following suppliers: __________________________ Supplier: [employer] Contact Person: Deberah Pelton, Wess Botts Phone:  [phone]   Seating Therapist: Ailene Ravel Phone:   [phone]   Phone: [phone]    Spouse/Parent/Caregiver name: Bethann Goo   Phone number: same as above Insurance/Payer: [facility] Medicare     Reason for Referral: Need for manual wheelchair  Patient/Caregiver Goals: To be able to move in his environment safely and independently while completing activities of daily living.   Patient was seen for face-to-face evaluation for new manual wheelchair.  Also present was Deberah Pelton, [employer] to discuss recommendations and wheelchair options.  Further paperwork was completed and sent to vendor.  Patient appears to qualify for manual mobility device at this time per objective findings.   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   History including Falls: None    HOME ENVIRONMENT: [x] House  [] Condo/town home  [] Apartment  [] Assisted  Living    [] Lives Alone [x]  Lives with Others                                                                                          Hours with caregiver: N/A  [] Home is accessible to patient           Stairs      [] Yes [x]  No     Ramp [x] Yes [] No Comments:  Lives with wife and daughter.    COMMUNITY ADL: TRANSPORTATION: [x] Car    [] Van    [] Public Transportation    [] Adapted w/c Lift    [] Ambulance    [] Other:       [] Sits in wheelchair during transport  Employment/School: N/A Specific requirements pertaining to mobility   Other:     FUNCTIONAL/SENSORY PROCESSING SKILLS:  Handedness:   [x] Right     [] Left    [] NA  Comments:   Due to recent Right CVA, patient is unable to use his right hand for any daily task and relies on his left hand for all functional tasks.   Functional Processing Skills for Wheeled Mobility [x] Processing Skills are adequate for safe wheelchair operation  Areas of concern than may interfere with safe operation of wheelchair Description of problem   []  Attention to environment      [] Judgment      []  Hearing  []  Vision or visual processing      [] Motor Planning  []  Fluctuations in Behavior      VERBAL COMMUNICATION: [x] WFL receptive [x]  WFL expressive [] Understandable  [] Difficult to understand  [] non-communicative []  Uses an augmented communication device  CURRENT SEATING / MOBILITY: Current Mobility Base:  [x] None [] Dependent [] Manual [] Scooter [] Power  Type of Control:   Manufacturer:  Size:  Age:   Current Condition of Mobility Base:     Current Wheelchair components:    Describe posture in present seating system:        SENSATION and SKIN ISSUES: Sensation [] Intact  [x] Impaired [] Absent  Level of sensation: right hand fingertip numbness. Digits 2-5 Pressure Relief: Able to perform effective pressure relief :    [x] Yes  []  No Method: Standing using stable surface to maintain balance.  If not, Why?:   Skin Issues/Skin Integrity Current Skin Issues   [] Yes [x] No [] Intact []  Red area[]  Open Area  [] Scar Tissue [] At risk from prolonged sitting Where    History of Skin Issues  [] Yes [x] No Where   When    Hx of skin flap  surgeries  [] Yes [x] No Where   When    Limited sitting tolerance [] Yes [x] No Hours spent sitting in wheelchair daily: 11-12 hours  Complaint of Pain:  Please describe: right hand digits 2-5. Constant. 3/10 numbness/tingling  right shoulder 2/10 with movement/passive stretching   Swelling/Edema: mild swelling reported in toes of right foot from lack of mobility. No pitting edema present.    ADL STATUS (in reference to wheelchair use):  Indep Assist Unable Indep with Equip Not assessed Comments  Dressing []  [x]  []  []  []  Min-Mod assist from Wife. either seated in chair or EOB.  Eating [x]  []  []  []  []    Toileting []  [x]  []  []  []  Mod-Max Assistance from Wife provided    Bathing []  [x]  []  []  []  tub/shower unit. shower chair with back. Lise provides min assist for bathing left arm and back.  Grooming/Hygiene []  [x]  []  []  []  Assistance with set-up of oral care supplies. Needs increased asssistance for shaving.   Meal Prep []  []  [x]  []  []  Wife completes  IADLS []  []  [x]  []  []  Wife completes  Bowel Management: [x] Continent  [] Incontinent  [] Accidents Comments:    Bladder Management: [x] Continent  [] Incontinent  [] Accidents Comments:       WHEELCHAIR SKILLS: Manual w/c Propulsion: [x] UE or LE strength and endurance sufficient to participate in ADLs using manual wheelchair Arm : [x] left [] right   [] Both      Distance: 50+ feet/household distance. Foot:  [x] left [] right   [] Both  Operate Scooter: []  Strength, hand grip, balance and transfer appropriate for use [] Living environment is accessible for use of scooter  Operate Power w/c:  []  Std. Joystick   []  Alternative Controls Indep []  Assist []  Dependent/unable []  N/A []   [] Safe          []  Functional      Distance:   Bed confined without wheelchair [x]  Yes []  No    STRENGTH/RANGE OF MOTION:  Active Range of Motion Strength  Shoulder Right: no active movement present. Able to achieve passive shoulder flexion to approximately 90 degrees. Just under 90 degrees for passive abduction. Very limited tolerance for passive external rotation. Flexor tone noted with all passive shoulder ranges.  Left: WNL in all ranges.  Right: 0/5 Left: 5/5 flexion, abduction, IR/er  Elbow Right: no active movement present. Passive elbow flexion is WFL. Passive elbow extension is approximately 20 degrees laciking of full range.  Left: WNL in all ranges.  Right: 0/5 Left: 5/5 flexion/extension  Wrist/Hand Right: no active movement present. Passive wrist flexion is WFL. Passive wrist extension is 50% of range with flexor tone noted. Right: 0/5 Left: 5/5 flexion/extension No gross grasp ability in right hand. Left hand demonstrates functional gross grasp.  Hip Right: No active movement present.  Left: WNL in all ranges.  Right: 1/5 hip adductors. 0/5 for hip abductors, flexion Left: 5/5 for all hip ranges.  Knee Right: No active movement present.  Left WNL in all ranges. Right: 0/5 Left: 5/5 flexion, extension  Ankle Right: No active movement present.  Left: WNL in all ranges.   Right: 0/5 Left: 5/5 dorsiflexion/plantar flexion    MOBILITY/BALANCE:  []  Patient is totally dependent for mobility      Balance Transfers Ambulation  Sitting Balance: Standing Balance: []  Independent []  Independent/Modified Independent  [x]  WFL     []  WFL []  Supervision []  Supervision  []  Uses UE for balance  []  Supervision [x]  Min Assist [x]  Ambulates with Assist  short distance within the home with  min assist and hemi walker.     []  Min Assist [x]  Min assist []  Mod Assist [x]  Ambulates with Device:      []  RW  []  StW  []  Cane  [x]  hemi walker  []  Mod Assist []  Mod assist []  Max assist   []  Max Assist []  Max assist []  Dependent []  Indep. Short Distance Only  []  Unable []  Unable []  Lift / Sling  Required Distance (in feet)     []  Sliding board []  Unable to Ambulate (see explanation below)  Cardio Status:  [] Intact  [x]  Impaired   []  NA     Diastolic CHF  Respiratory Status:  [] Intact   [x] Impaired   [] NA     COPD  Orthotics/Prosthetics: Resting hand splint - right hand for night time use. Right leg AFO   Comments (Address manual vs power w/c vs scooter): Due to right side hemiparesis and inability to utilize his right UE for any functional task, patient will be using his left leg and left arm to self-propel his wheelchair. He is unable to navigate his environment without the use of an assistive device such as a manual wheelchair. He is unable to ambulate safely and for the needed distance within the home with only the use of his hemi walker.           Anterior / Posterior Obliquity Rotation-Pelvis   PELVIS    [x]  []  []   Neutral Posterior Anterior  [x]  []  []   WFL Rt elev Lt elev  [x]  []  []   WFL Right Left                      Anterior    Anterior     []  Fixed []  Other []  Partly Flexible []  Flexible   []  Fixed []  Other []  Partly Flexible  []  Flexible  []  Fixed []  Other []  Partly Flexible  []  Flexible   TRUNK  [x]  []  []   WFL  Thoracic  Lumbar  Kyphosis Lordosis  [x]  []  []   WFL Convex Convex  Right Left [] c-curve [] s-curve [] multiple  [x]  Neutral []  Left-anterior []  Right-anterior     []  Fixed []  Flexible []  Partly Flexible []  Other  []  Fixed []  Flexible []  Partly Flexible []  Other  []  Fixed             []  Flexible []  Partly Flexible []  Other    Position Windswept    HIPS          [x]            []               []    Neutral       Abduct        ADduct         [x]           []            []   Neutral Right           Left      []  Fixed []  Subluxed []  Partly Flexible []  Dislocated []  Flexible  []  Fixed []  Other []  Partly Flexible  []  Flexible                 Foot Positioning Knee Positioning      [x]  WFL  [] Lt [] Rt [x]  WFL  [] Lt [] Rt    KNEES ROM concerns:  ROM concerns:    & Dorsi-Flexed [] Lt [] Rt  FEET Plantar Flexed [] Lt [] Rt      Inversion                 [] Lt [] Rt      Eversion                 [] Lt [] Rt     HEAD [x]  Functional [x]  Good Head Control    & []  Flexed         []  Extended []  Adequate Head Control    NECK []  Rotated  Lt  []  Lat Flexed Lt []  Rotated  Rt []  Lat Flexed Rt []  Limited Head Control     []  Cervical Hyperextension []  Absent  Head Control     SHOULDERS ELBOWS WRIST& HAND       Left     Right    Left     Right    Left     Right   U/E [x] Functional           [] Functional WFL Unable to fully extend elbow straight. Limited by approximately 20 degrees full extension [] Fisting             [] Fisting      [] elev   [] dep      [] elev   [] dep       [] pro -[] retract     [] pro  [] retract [] subluxed             [x] subluxed          Goals for Wheelchair Mobility  [x]  Independence with mobility in the home with motor related ADLs (MRADLs)  []  Independence with MRADLs in the community []  Provide dependent mobility  []  Provide recline     [] Provide tilt   Goals for Seating system [x]  Optimize pressure distribution []  Provide support needed to facilitate function or safety []  Provide corrective forces to assist with maintaining or improving posture []  Accommodate client's posture:   current seated postures and positions are not flexible or will not tolerate corrective forces [x]  Client to be independent with relieving pressure in the wheelchair [] Enhance physiological function such as breathing, swallowing, digestion  Simulation ideas/Equipment trials: State why other equipment was unsuccessful:   MOBILITY BASE RECOMMENDATIONS and JUSTIFICATION: MOBILITY COMPONENT JUSTIFICATION  Manufacturer: [employer] Model: Cat 4   Size: Width 18 Seat Depth 20  [x] provide transport from point A to B      [x] promote Indep mobility  [x] is not a safe, functional ambulator [x] walker or cane inadequate [] non-standard width/depth necessary to  accommodate anatomical measurement []    [x] Manual Mobility Base [x] non-functional ambulator    [] Scooter/POV  [] can safely operate  [] can safely transfer   [] has adequate trunk stability  [] cannot functionally propel manual w/c  [] Power Mobility Base  [] non-ambulatory  [] cannot functionally propel manual wheelchair  []  cannot functionally and safely operate scooter/POV [] can safely operate and willing to  [] Stroller Base [] infant/child  [] unable to propel manual wheelchair [] allows for growth [] non-functional ambulator [] non-functional UE [] Indep mobility is not a goal at this time  [] Tilt  [] Forward [] Backward [] Powered tilt  [] Manual tilt  [] change position against gravitational force on head and shoulders  [] change position for pressure relief/cannot weight shift [] transfers  [] management of tone [] rest periods [] control edema [] facilitate postural control  []    [] Recline  [] Power recline on power base [] Manual recline on manual base  [] accommodate femur to back angle  [] bring to full recline for ADL care  [] change position for pressure relief/cannot weight shift [] rest periods [] repositioning  for transfers or clothing/diaper /catheter changes [] head positioning  [] Lighter weight required [] self- propulsion  [] lifting []    [] Heavy Duty required [] user weight greater than 250# [] extreme tone/ over active movement [] broken frame on previous chair []    [x]  Back  []  Angle Adjustable []  Custom molded Tension adjustable [] postural control [] control of tone/spasticity [] accommodation of range of motion [] UE functional control [x] accommodation for seating system []   [] provide lateral trunk support [] accommodate deformity [] provide posterior trunk support [] provide lumbar/sacral support [] support trunk in midline [] Pressure relief over spinal processes  [x]  Seat Cushion Axiom G [] impaired sensation  [] decubitus ulcers present [] history of pressure ulceration [] prevent  pelvic extension [x] low maintenance  [] stabilize pelvis  [] accommodate obliquity [] accommodate multiple deformity [] neutralize lower extremity position [x] increase pressure distribution []    []  Pelvic/thigh support  []  Lateral thigh guide []  Distal medial pad  []  Distal lateral pad []  pelvis in neutral [] accommodate pelvis []  position upper legs []  alignment []  accommodate ROM []  decr adduction [] accommodate tone [] removable for transfers [] decr abduction  []  Lateral trunk Supports []  Lt     []  Rt [] decrease lateral trunk leaning [] control tone [] contour for increased contact [] safety  [] accommodate asymmetry []    []  Mounting hardware  [] lateral trunk supports  [] back   [] seat [] headrest      []  thigh support [] fixed   [] swing away [] attach seat platform/cushion to w/c frame [] attach back cushion to w/c frame [] mount postural supports [] mount headrest  [] swing medial thigh support away [] swing lateral supports away for transfers  []      Armrests  [] fixed [x] adjustable height [x] removable   [] swing away  [] flip back   [] reclining [] full length pads [] desk    [] pads tubular  [x] provide support with elbow at 90   [x] provide support for w/c tray [] change of height/angles for variable activities [x] remove for transfers [] allow to come closer to table top [] remove for access to tables []    Hangers/ Leg rests  [] 60 [x] 70 [] 90 [] elevating [] heavy duty  [] articulating [] fixed [x] lift off [x] swing away     [] power [x] provide LE support  [] accommodate to hamstring tightness [] elevate legs during recline   [] provide change in position for Legs [] Maintain placement of feet on footplate [] durability [] enable transfers [] decrease edema [] Accommodate lower leg length []    Foot support Footplate    [] Lt  []  Rt  []  Center mount [x] flip up     [x] depth/angle adjustable [] Amputee adapter    []  Lt     []  Rt [x] provide foot support [] accommodate to ankle ROM [] transfers [] Provide  support for residual extremity []  allow foot to go under wheelchair base []  decrease tone  []    []  Ankle strap/heel loops [] support foot on foot support [] decrease extraneous movement [] provide input to heel  [] protect foot  Tires: [x] pneumatic  [x] flat free inserts  [] solid  [x] decrease maintenance  [] prevent frequent flats [] increase shock absorbency [] decrease pain from road shock [] decrease spasms from road shock []    []  Headrest  [] provide posterior head support [] provide posterior neck support [] provide lateral head support [] provide anterior head support [] support during tilt and recline [] improve feeding   [] improve respiration [] placement of switches [] safety  [] accommodate ROM  [] accommodate tone [] improve visual orientation  []  Anterior chest strap []  Vest []  Shoulder retractors  [] decrease forward movement of shoulder [] accommodation of TLSO [] decrease forward movement of trunk [] decrease shoulder elevation [] added abdominal support [] alignment [] assistance with shoulder control  []    Pelvic Positioner [x] Belt [] SubASIS bar [] Dual Pull [] stabilize tone [x] decrease falling out of chair/ **  will not Decr potential for sliding due to pelvic tilting [] prevent excessive rotation [] pad for protection over boney prominence [] prominence comfort [] special pull angle to control rotation []    Upper Extremity Support [] L   [x]  R [] Arm trough    [] hand support [x]  tray       [] full tray [] swivel mount [] decrease edema      [x] decrease subluxation   [x] control tone   [] placement for AAC/Computer/EADL [] decrease gravitational pull on shoulders [] provide midline positioning [x] provide support to increase UE function [] provide hand support in natural position [] provide work surface   POWER WHEELCHAIR CONTROLS  [] Proportional  [] Non-Proportional Type  [] Left  [] Right [] provides access for controlling wheelchair   [] lacks motor control to operate proportional  drive control [] unable to understand proportional controls  Actuator Control Module  [] Single  [] Multiple   [] Allow the client to operate the power seat function(s) through the joystick control   [] Safety Reset Switches [] Used to change modes and stop the wheelchair when driving in latch mode    [] Upgraded Electronics   [] programming for accurate control [] progressive Disease/changing condition [] non-proportional drive control needed [] Needed in order to operate power seat functions through joystick control   [] Display box [] Allows user to see in which mode and drive the wheelchair is set  [] necessary for alternate controls    [] Digital interface electronics [] Allows w/c to operate when using alternative drive controls  [] ASL Head Array [] Allows client to operate wheelchair  through switches placed in tri-panel headrest  [] Sip and puff with tubing kit [] needed to operate sip and puff drive controls  [] Upgraded tracking electronics [] increase safety when driving [] correct tracking when on uneven surfaces  [] Mount for switches or joystick [] Attaches switches to w/c  [] Swing away for access or transfers [] midline for optimal placement [] provides for consistent access  [] Attendant controlled joystick plus mount [] safety [] long distance driving [] operation of seat functions [] compliance with transportation regulations []      Rear wheel placement/Axle adjustability [] None [x] semi adjustable [] fully adjustable  [x] improved UE access to wheels [] improved stability [] changing angle in space for improvement of postural stability [] 1-arm drive access [] amputee pad placement []    Wheel rims/ hand rims  [x] metal  [] plastic coated [] oblique projections [] vertical projections [x] Provide ability to propel manual wheelchair  []  Increase self-propulsion with hand weakness/decreased grasp  Push handles [] extended  [] angle adjustable  [] standard [] caregiver access [] caregiver assist [] allows  "hooking" to enable increased ability to perform ADLs or maintain balance  One armed device  [] Lt   [] Rt [] enable propulsion of manual wheelchair with one arm   []      Brake/wheel lock extension [x]  Lt   [x]  Rt [x] increase indep in applying wheel locks   [] Side guards [] prevent clothing getting caught in wheel or becoming soiled []  prevent skin tears/abrasions  Battery:  [] to power wheelchair   Other: Anti-tippers  Heel loops for Footplates To provide support behind heel to help maintain position on footplate.   to improve additional stability and keep the wheelchair from tipping if it is in an unstable position  The above equipment has a life- long use expectancy. Growth and changes in medical and/or functional conditions would be the exceptions. This is to certify that the therapist has no financial relationship with durable medical provider or manufacturer. The therapist will not receive remuneration of any kind for the equipment recommended in this evaluation.   Patient has mobility limitation that significantly impairs safe, timely participation in one or more mobility related ADL's.  (bathing, toileting, feeding,  dressing, grooming, moving from room to room)                                                             [x]  Yes []  No Will mobility device sufficiently improve ability to participate and/or be aided in participation of MRADL's?         [x]  Yes []  No Can limitation be compensated for with use of a cane or walker?                                                                                []  Yes [x]  No Does patient or caregiver demonstrate ability/potential ability & willingness to safely use the mobility device?   [x]  Yes []  No Does patient's home environment support use of recommended mobility device?                                                    [x]  Yes []  No Does patient have sufficient upper extremity function necessary to functionally propel a manual wheelchair?    [x]   Yes []  No Does patient have sufficient strength and trunk stability to safely operate a POV (scooter)?                                  []  Yes []  No Does patient need additional features/benefits provided by a power wheelchair for MRADL's in the home?       []  Yes [x]  No Does the patient demonstrate the ability to safely use a power wheelchair?                                                              []  Yes []  No  Therapist Name Printed: Ailene Ravel OTR/L, CBIS Date: 12/20/20  Therapist's Signature:   Date:   Supplier's Name Printed:  Date:   Supplier's Signature:   Date:  Patient/Caregiver Signature:   Date:     This is to certify that I have read this evaluation and do agree with the content within:   70 Name Printed:   90 Signature:  Date:     This is to certify that I, the above signed therapist have the following affiliations: []  This DME provider []  Manufacturer of recommended equipment []  Patient's long term care facility [x]  None of the above            OT Short Term Goals - 12/20/20 1553       OT SHORT TERM GOAL #1   Title Pt and caregiver will be educated on HEP  to improve independence in ADL completion.    Time 4    Period Weeks    Target Date 11/25/20      OT SHORT TERM GOAL #2   Title Pt will demonstrate correct completion of weightbearing techniques to support nerve reinervation and decrease shoulder subluxation.    Time 4    Period Weeks    Status Partially Met   Requires assistance/set-up to demonstrate correct weightbearing technique.     OT SHORT TERM GOAL #3   Title Pt will be educated on and demonstrate proficiency in completion of adaptive strategies and techniques to improve independence in ADLs such as dressing, bathing, and simple meal preparation tasks.    Time 4    Period Weeks      OT SHORT TERM GOAL #4   Title Pt will increase P/ROM to [facility] to improve ability to complete RUE stretching tasks and decrease  pain with functional tasks.    Time 4    Period Weeks    Status Not Met      OT SHORT TERM GOAL #5   Title Pt and caregiver will demonstrate understanding of orthotics wear and donning/doffing such as sling and/or splinting to support RUE during ADLs and mobility tasks.    Time 4    Period Weeks    Status Achieved      OT SHORT TERM GOAL #6   Title Patient will present with 1/5 right UE shoulder for flexion, extension, and scapular elevation which will allow patient to begin using his RUE as stabilizer during self care tasks as able.    Time 4    Period Weeks    Status Not Met    Target Date 12/20/20                  Plan - 12/20/20 1552     Clinical Impression Statement A: Reassessment completed this date. No new goals have been met since previous reassessment. patient has overall met 3/6 therapy goals with 1 additional goal partially met. He is unable to demonstrate any functional movement in his RUE. Right shoulder is present with less than one finger subluxation. He is experiencing increased flexor tone in the shoulder, elbow, and wrist which causes him increased pain and decreased tolerance during passive movement. Limited to half range or less passively with the right shoulder, elbow, and wrist. Participates in basic ADL tasks with wife, [name] assisting such as bathing, dressing, and grooming. He requires min-mod assist with dressing, min assist with bathing, and set-up of supplies for grooming tasks. ADL education and HEP have been completed throughout therapy sessions. At this time, discharge is recommended to begin episodic care as no additional progress is demonstrated at this time. Letter of Medical Necessity for manual wheelchair is above. Deberah Pelton, ATP with [employer] was present during session.    Body Structure / Function / Physical Skills ADL;Dexterity;Flexibility;ROM;Strength;Vision;Balance;Coordination;Edema;FMC;IADL;Tone;UE functional  use;Sensation;Endurance;Decreased knowledge of precautions;Body mechanics;Decreased knowledge of use of DME;GMC;Mobility;Proprioception;Pain    Plan P: Discharge from OT services to begin episodice care. If patient wishes to return to clinic for re-evaluation around 4-5 months he will need to request a new MD order. Patient and wife verbalize understanding. Letter of medical necessity for Manual Wheelchair is in this note.    Consulted and Agree with Plan of Care Patient;Family member/caregiver    Family Member Consulted wife [name]             Patient will benefit from skilled therapeutic intervention in  order to improve the following deficits and impairments:   Body Structure / Function / Physical Skills: ADL, Dexterity, Flexibility, ROM, Strength, Vision, Balance, Coordination, Edema, FMC, IADL, Tone, UE functional use, Sensation, Endurance, Decreased knowledge of precautions, Body mechanics, Decreased knowledge of use of DME, GMC, Mobility, Proprioception, Pain       Visit Diagnosis: Other symptoms and signs involving the nervous system  Other lack of coordination  Pain in right arm    Problem List Patient Active Problem List   Diagnosis Date Noted   Dysphagia, post-stroke    Essential hypertension    Diabetic peripheral neuropathy (HCC)    Infarction of left basal ganglia ([place]) 09/19/2020   Acute CVA (cerebrovascular accident) ([place]) 09/06/2020   Stroke ([place]) 11/23/2018   Numbness 11/23/2018   CVA (cerebral vascular accident) ([place]) 11/23/2018   Lung nodule 11/17/2017   Chest pressure 11/22/2016   Shortness of breath 11/22/2016   Depression, major, single episode, moderate ([place]) 10/14/2016   Special screening for malignant neoplasms, colon    Chronic obstructive pulmonary disease ([place]) 06/08/2016   Hypertensive emergency 02/16/2016   Hypertensive urgency 06/07/2015   CKD (chronic kidney disease), stage III ([place]) 06/07/2015   Pain in the chest    Elevated troponin     Chronic diastolic CHF (congestive heart failure) ([place]) 09/17/2013   DM type 2 (diabetes mellitus, type 2) ([place]) 09/15/2013   Chest pain 08/01/2011   HTN (hypertension) 08/01/2011   Chest pain 07/22/2011   Renal insufficiency 07/22/2011   Tobacco abuse 07/22/2011   Hypertension     OCCUPATIONAL THERAPY DISCHARGE SUMMARY  Visits from Start of Care: 17  Current functional level related to goals / functional outcomes: See above   Remaining deficits: See above   Education / Equipment: Self ROM, weightbearing techniques, Kinsiotape technique for right shoulder stability. NMES unit for home use.    Patient agrees to discharge. Patient goals were partially met. Patient is being discharged due to maximized rehab potential. .    Ailene Ravel, OTR/L,CBIS  [phone]  12/20/2020, 4:01 PM  [place], [place], [address] Phone: [phone]   Fax:  [phone]  Name: Craig Knight MRN: [id number] Date of Birth: 1965/11/06

## 2020-12-20 NOTE — Therapy (Signed)
[place], [place], [address] Phone: [phone]   Fax:  [phone]  Physical Therapy Treatment  Patient Details  Name: Craig Knight MRN: [id number] Date of Birth: 08/20/1965 Referring Provider (PT): Alysia Penna MD   Encounter Date: 12/20/2020   PT End of Session - 12/20/20 1403     Visit Number 16    Number of Visits 18    Date for PT Re-Evaluation 12/26/20    Authorization Type UHC Medicare    Progress Note Due on Visit 18    PT Start Time 1345    PT Stop Time 1430    PT Time Calculation (min) 45 min    Equipment Utilized During Treatment Gait belt    Activity Tolerance Patient limited by fatigue;Patient tolerated treatment well    Behavior During Therapy [facility] for tasks assessed/performed             Past Medical History:  Diagnosis Date   Asthma    Bronchitis    COPD (chronic obstructive pulmonary disease) ([place])    Diabetes mellitus without complication ([place])    Heart murmur 68/04/2120   soft systolic murmur 1/6   Hypertension    Sleep apnea    Stroke [facility])     Past Surgical History:  Procedure Laterality Date   BUBBLE STUDY  09/18/2020   Procedure: BUBBLE STUDY;  Surgeon: Pixie Casino, MD;  Location: [place];  Service: Cardiovascular;;   COLONOSCOPY N/A 09/18/2016   Procedure: COLONOSCOPY;  Surgeon: Danie Binder, MD;  [facility];  Service: Endoscopy;  Laterality: N/A;  10:30 AM   lipoma removal     TEE WITHOUT CARDIOVERSION N/A 09/18/2020   Procedure: TRANSESOPHAGEAL ECHOCARDIOGRAM (TEE);  Surgeon: Pixie Casino, MD;  Location: [facility] ENDOSCOPY;  Service: Cardiovascular;  Laterality: N/A;    There were no vitals filed for this visit.   Subjective Assessment - 12/20/20 1401     Subjective No new issues to report. Would like to focus on walking    Patient is accompained by: Family member    Pertinent History CVA    Patient Stated Goals To walk, move my  right leg                [facility] PT Assessment - 12/20/20 0001       Assessment   Medical Diagnosis s/p CVA    Referring Provider (PT) Alysia Penna MD    Onset Date/Surgical Date 09/15/20    Hand Dominance Right                           [facility] Adult PT Treatment/Exercise - 12/20/20 0001       Ambulation/Gait   Ambulation/Gait Yes    Ambulation/Gait Assistance 4: Min assist    Assistive device Body weight support system   Treadmill   Gait Pattern Step-through pattern;Decreased dorsiflexion - right    Ambulation Surface Level    Gait velocity 0.2-0.5 mph    Stairs Yes    Stairs Assistance 4: Min assist    Stairs Assistance Details (indicate cue type and reason) facilitation for RLE for clearing curb and lowering extremity    Gait Comments performed training on treadmill with therapist providing kinematics for RLE with pt focus on LLE step length with visual cue of stepping left foot past right foot in swing phase. Demo 80% carryover during task. 3x4 min trials, 1x3 min  PT Short Term Goals - 11/02/20 1127       PT SHORT TERM GOAL #1   Title Patient will be independent with initial HEP and self-management strategies to improve functional outcomes    Time 3    Period Weeks    Status On-going    Target Date 11/13/20               PT Long Term Goals - 11/02/20 1127       PT LONG TERM GOAL #1   Title Patient will be able to ambualte 150 feet using LRAD for improved funcitonal and household mobililty    Time 6    Period Weeks    Status On-going      PT LONG TERM GOAL #2   Title Patient will be able to perform bed mobility and functional transfers mod I    Time 8    Period Weeks    Status On-going                   Plan - 12/20/20 1529     Clinical Impression Statement Good tolerance and carryover with treadmill training demonstrating increased left step length 80% of trials. Difficulty with  RLE advancement due to profound hip flexor weakness and presence of spasticity/clonus but overall very good performance and follow-through with pattern. Continued sessions to improve motor control, balance, and gait velocity    Comorbidities multiple CVAs    Examination-Activity Limitations Transfers;Locomotion Level    Examination-Participation Restrictions Community Activity;Driving;Cleaning    Stability/Clinical Decision Making Stable/Uncomplicated    Rehab Potential Fair    PT Frequency 2x / week    PT Duration 8 weeks    PT Treatment/Interventions ADLs/Self Care Home Management;Aquatic Therapy;DME Instruction;Gait training;Stair training;Functional mobility training;Therapeutic activities;Therapeutic exercise;Balance training;Neuromuscular re-education;Patient/family education;Orthotic Fit/Training;Passive range of motion;Wheelchair mobility training;Visual/perceptual remediation/compensation;Energy conservation;Spinal Manipulations;Splinting;Joint Manipulations;Taping;Vasopneumatic Device;Compression bandaging;Manual techniques;Dry needling;Cognitive remediation;Electrical Stimulation;Cryotherapy;Biofeedback;Moist Heat;Traction;Iontophoresis 4mg /ml Dexamethasone    PT Next Visit Plan Conitnue NMES as indicated. Gait with hemi walker. Progress funcitonal activity as tolerated    PT Home Exercise Plan Eval: seated march, LAQs with assist as needed 9/19 assisted heelslides with iso holds to fatigue    Consulted and Agree with Plan of Care Patient;Family member/caregiver    Family Member Consulted wife             Patient will benefit from skilled therapeutic intervention in order to improve the following deficits and impairments:  Abnormal gait, Difficulty walking, Decreased endurance, Decreased activity tolerance, Decreased balance, Decreased mobility, Decreased strength, Postural dysfunction  Visit Diagnosis: Other symptoms and signs involving the nervous system  Muscle weakness  (generalized)  Difficulty in walking, not elsewhere classified  Other abnormalities of gait and mobility     Problem List Patient Active Problem List   Diagnosis Date Noted   Dysphagia, post-stroke    Essential hypertension    Diabetic peripheral neuropathy (HCC)    Infarction of left basal ganglia ([place]) 09/19/2020   Acute CVA (cerebrovascular accident) ([place]) 09/06/2020   Stroke ([place]) 11/23/2018   Numbness 11/23/2018   CVA (cerebral vascular accident) ([place]) 11/23/2018   Lung nodule 11/17/2017   Chest pressure 11/22/2016   Shortness of breath 11/22/2016   Depression, major, single episode, moderate ([place]) 10/14/2016   Special screening for malignant neoplasms, colon    Chronic obstructive pulmonary disease ([place]) 06/08/2016   Hypertensive emergency 02/16/2016   Hypertensive urgency 06/07/2015   CKD (chronic kidney disease), stage III ([place]) 06/07/2015   Pain in the chest  Elevated troponin    Chronic diastolic CHF (congestive heart failure) ([place]) 09/17/2013   DM type 2 (diabetes mellitus, type 2) ([place]) 09/15/2013   Chest pain 08/01/2011   HTN (hypertension) 08/01/2011   Chest pain 07/22/2011   Renal insufficiency 07/22/2011   Tobacco abuse 07/22/2011   Hypertension     Toniann Fail, PT 12/20/2020, 3:31 PM  [place] [address] [place], [place], [address] Phone: [phone]   Fax:  [phone]  Name: LANCELOT ALYEA MRN: [id number] Date of Birth: September 26, 1965

## 2020-12-21 ENCOUNTER — Encounter (HOSPITAL_COMMUNITY): Payer: Self-pay | Admitting: Physical Therapy

## 2020-12-21 ENCOUNTER — Ambulatory Visit (HOSPITAL_COMMUNITY): Payer: Medicare Other | Admitting: Physical Therapy

## 2020-12-21 DIAGNOSIS — R262 Difficulty in walking, not elsewhere classified: Secondary | ICD-10-CM

## 2020-12-21 DIAGNOSIS — I639 Cerebral infarction, unspecified: Secondary | ICD-10-CM | POA: Diagnosis not present

## 2020-12-21 DIAGNOSIS — M6281 Muscle weakness (generalized): Secondary | ICD-10-CM

## 2020-12-21 NOTE — Therapy (Signed)
[place], [place], [address] Phone: [phone]   Fax:  [phone]  Physical Therapy Treatment  Patient Details  Name: Craig Knight MRN: [id number] Date of Birth: 20-May-1965 Referring Provider (PT): Alysia Penna MD PHYSICAL THERAPY DISCHARGE SUMMARY  Visits from Start of Care: 17  Current functional level related to goals / functional outcomes: See below   Remaining deficits: Gt, strength    Education / Equipment: HEP   Patient agrees to discharge. Patient goals were partially met. Patient is being discharged due to lack of progress. As well As pt request.  Encounter Date: 12/21/2020   PT End of Session - 12/21/20 0906     Visit Number 17    Number of Visits 17    Date for PT Re-Evaluation 12/26/20    Authorization Type UHC Medicare    Progress Note Due on Visit 18    PT Start Time 0915    PT Stop Time 1000    PT Time Calculation (min) 45 min    Activity Tolerance Patient limited by fatigue;Patient tolerated treatment well    Behavior During Therapy [facility] for tasks assessed/performed             Past Medical History:  Diagnosis Date   Asthma    Bronchitis    COPD (chronic obstructive pulmonary disease) ([place])    Diabetes mellitus without complication ([place])    Heart murmur 54/65/6812   soft systolic murmur 1/6   Hypertension    Sleep apnea    Stroke [facility])     Past Surgical History:  Procedure Laterality Date   BUBBLE STUDY  09/18/2020   Procedure: BUBBLE STUDY;  Surgeon: Pixie Casino, MD;  Location: [place];  Service: Cardiovascular;;   COLONOSCOPY N/A 09/18/2016   Procedure: COLONOSCOPY;  Surgeon: Danie Binder, MD;  [facility];  Service: Endoscopy;  Laterality: N/A;  10:30 AM   lipoma removal     TEE WITHOUT CARDIOVERSION N/A 09/18/2020   Procedure: TRANSESOPHAGEAL ECHOCARDIOGRAM (TEE);  Surgeon: Pixie Casino, MD;  Location: [facility] ENDOSCOPY;  Service:  Cardiovascular;  Laterality: N/A;    There were no vitals filed for this visit.   Subjective Assessment - 12/21/20 0858     Subjective No questions, pt is completing his HEP    Patient is accompained by: Family member    Pertinent History CVA    Patient Stated Goals To walk, move my right leg    Currently in Pain? No/denies                [facility] PT Assessment - 12/21/20 0001       Assessment   Medical Diagnosis CVA    Referring Provider (PT) Alysia Penna MD    Onset Date/Surgical Date 09/15/20   patient reported   Prior Therapy Yes, inpatient      Home Environment   Living Environment Private residence    Living Arrangements Spouse/significant other;Children    Available Help at Discharge Family      Prior Function   Level of Independence Independent with household mobility without device      Cognition   Overall Cognitive Status --      Strength   Right Hip Flexion --   2- was 2+   Right Hip Extension 1/5    Right Hip ABduction 1/5   on 10/4 was 1/5   Right Hip ADduction 2/5    Left Hip Flexion 5/5  Right Knee Flexion 1/5   on 10/4 was 2+/5 but was tested sitting   Right Knee Extension 1/5   was 1/5   Right Ankle Dorsiflexion 0/5   was 0/5   Right Ankle Plantar Flexion 1/5      Bed Mobility   Bed Mobility Sit to Supine;Supine to Sit    Rolling Right Minimal Assistance - Patient > 75%    Rolling Left Minimal Assistance - Patient > 75%    Supine to Sit Contact Guard/Touching assist    Sit to Supine Moderate Assistance - Patient 50-74%      Transfers   Transfers Sit to Stand;Stand Pivot Transfers    Sit to Stand 4: Min guard    Stand Pivot Transfers 4: Min guard      Ambulation/Gait   Ambulation/Gait --    Ambulation/Gait Assistance --    Ambulation Distance (Feet) --    Assistive device --    Gait Pattern --    Gait Comments --                           [facility] Adult PT Treatment/Exercise - 12/21/20 0001       Knee/Hip  Exercises: Supine   Quad Sets 2 sets;Strengthening;Right;10 reps    Heel Slides Strengthening;Right;10 reps    Other Supine Knee/Hip Exercises ab set/ ankle pump, glut set, rolling to the RT all x 10                     PT Education - 12/21/20 1037     Education Details Updated pt HP    Person(s) Educated Patient;Spouse    Methods Explanation;Demonstration;Handout    Comprehension Verbalized understanding;Returned demonstration              PT Short Term Goals - 12/21/20 0947       PT SHORT TERM GOAL #1   Title Patient will be independent with initial HEP and self-management strategies to improve functional outcomes    Time 3    Period Weeks    Status Partially Met    Target Date 11/13/20               PT Long Term Goals - 12/21/20 0947       PT LONG TERM GOAL #1   Title Patient will be able to ambualte 150 feet using LRAD for improved funcitonal and household mobililty    Baseline 70f with RW and CGA    Time 6    Period Weeks    Status Not Met      PT LONG TERM GOAL #2   Title Patient will be able to perform bed mobility and functional transfers mod I    Time 8    Period Weeks    Status Not Met      PT LONG TERM GOAL #3   Title Assess progress towards BERG    Baseline TBD    Time 8    Period Weeks    Status On-going      PT LONG TERM GOAL #4   Title Assess progress towards FGA/DGI    Baseline TBD    Time 8    Status Not Met      PT LONG TERM GOAL #5   Title Assess progress towards TUG    Baseline TBD    Time 8    Period Weeks    Status Not Met  PT LONG TERM GOAL #6   Title Assess progress towards 5x STS    Baseline TBD    Time 8    Status Not Met                   Plan - 12/21/20 0935     Clinical Impression Statement Pt reassessed today with no significant gains made in mm strength.  Pt feels that he is ready to take a break from therapy.  Therapist updated HEP    Comorbidities multiple CVAs     Examination-Activity Limitations Transfers;Locomotion Level    Examination-Participation Restrictions Community Activity;Driving;Cleaning    Stability/Clinical Decision Making Stable/Uncomplicated    Rehab Potential Fair    PT Frequency 2x / week    PT Duration 8 weeks    PT Treatment/Interventions ADLs/Self Care Home Management;Aquatic Therapy;DME Instruction;Gait training;Stair training;Functional mobility training;Therapeutic activities;Therapeutic exercise;Balance training;Neuromuscular re-education;Patient/family education;Orthotic Fit/Training;Passive range of motion;Wheelchair mobility training;Visual/perceptual remediation/compensation;Energy conservation;Spinal Manipulations;Splinting;Joint Manipulations;Taping;Vasopneumatic Device;Compression bandaging;Manual techniques;Dry needling;Cognitive remediation;Electrical Stimulation;Cryotherapy;Biofeedback;Moist Heat;Traction;Iontophoresis 32m/ml Dexamethasone    PT Next Visit Plan Discharge    PT Home Exercise Plan Eval: seated march, LAQs with assist as needed 9/19 assisted heelslides with iso holds to fatigue    Consulted and Agree with Plan of Care Patient;Family member/caregiver    Family Member Consulted wife             Patient will benefit from skilled therapeutic intervention in order to improve the following deficits and impairments:  Abnormal gait, Difficulty walking, Decreased endurance, Decreased activity tolerance, Decreased balance, Decreased mobility, Decreased strength, Postural dysfunction  Visit Diagnosis: Muscle weakness (generalized)  Difficulty in walking, not elsewhere classified     Problem List Patient Active Problem List   Diagnosis Date Noted   Dysphagia, post-stroke    Essential hypertension    Diabetic peripheral neuropathy ([place]    Infarction of left basal ganglia ([place] 09/19/2020   Acute CVA (cerebrovascular accident) ([place] 09/06/2020   Stroke ([place] 11/23/2018   Numbness 11/23/2018   CVA  (cerebral vascular accident) ([place] 11/23/2018   Lung nodule 11/17/2017   Chest pressure 11/22/2016   Shortness of breath 11/22/2016   Depression, major, single episode, moderate ([place] 10/14/2016   Special screening for malignant neoplasms, colon    Chronic obstructive pulmonary disease ([place] 06/08/2016   Hypertensive emergency 02/16/2016   Hypertensive urgency 06/07/2015   CKD (chronic kidney disease), stage III ([place] 06/07/2015   Pain in the chest    Elevated troponin    Chronic diastolic CHF (congestive heart failure) ([place] 09/17/2013   DM type 2 (diabetes mellitus, type 2) ([place] 09/15/2013   Chest pain 08/01/2011   HTN (hypertension) 08/01/2011   Chest pain 07/22/2011   Renal insufficiency 07/22/2011   Tobacco abuse 07/22/2011   Hypertension    CRayetta Humphrey PT CLT [phone] 12/21/2020, 10:41 AM  [place] [address]Phone: [phone]  Fax:  [phone] Name: KBOMANI OOMMENMRN: [id number] of Birth: 7February 21, 1967

## 2021-01-07 NOTE — Progress Notes (Deleted)
Cardiology Office Note:   Date:  01/07/2021  NAME:  Craig Knight    MRN: [id number] DOB:  12-04-1965   PCP:  Rosita Fire, MD  Cardiologist:  Pixie Casino, MD  Electrophysiologist:  None   Referring MD: Margette Fast, MD   No chief complaint on file. ***  History of Present Illness:   Craig Knight is a 55 y.o. male with a hx of DM, HTN, CVA, HLD ,CKD who is being seen today for the evaluation of cryptogenic stroke at the request of Rosita Fire, MD.  Problem List CVA -08/2020 -L basal ganglia  -intracranial atherosclerosis  -TEE negative -no Afib on ILR 2. DM -A1c 7.4 3. HTN 4. CKD 5. HLD -T chol 115, HDL 33, LDL 67, TG 77  Past Medical History: Past Medical History:  Diagnosis Date   Asthma    Bronchitis    COPD (chronic obstructive pulmonary disease) (HCC)    Diabetes mellitus without complication (HCC)    Heart murmur 64/40/3474   soft systolic murmur 1/6   Hypertension    Sleep apnea    Stroke [facility])     Past Surgical History: Past Surgical History:  Procedure Laterality Date   BUBBLE STUDY  09/18/2020   Procedure: BUBBLE STUDY;  Surgeon: Pixie Casino, MD;  Location: [place];  Service: Cardiovascular;;   COLONOSCOPY N/A 09/18/2016   Procedure: COLONOSCOPY;  Surgeon: Danie Binder, MD;  [facility];  Service: Endoscopy;  Laterality: N/A;  10:30 AM   lipoma removal     TEE WITHOUT CARDIOVERSION N/A 09/18/2020   Procedure: TRANSESOPHAGEAL ECHOCARDIOGRAM (TEE);  Surgeon: Pixie Casino, MD;  Location: [facility];  Service: Cardiovascular;  Laterality: N/A;    Current Medications: No outpatient medications have been marked as taking for the 01/08/21 encounter (Appointment) with O'Neal, Cassie Freer, MD.     Allergies:    Dust mite extract and Lisinopril   Social History: Social History   Socioeconomic History   Marital status: Married    Spouse name: Not on file   Number of children: Not on file   Years of  education: Not on file   Highest education level: Not on file  Occupational History   Not on file  Tobacco Use   Smoking status: Every Day    Packs/day: 0.25    Types: Cigarettes   Smokeless tobacco: Never   Tobacco comments:    Quitting per patient  Vaping Use   Vaping Use: Never used  Substance and Sexual Activity   Alcohol use: Yes    Alcohol/week: 0.0 standard drinks    Comment: occasionally   Drug use: No   Sexual activity: Yes    Birth control/protection: None  Other Topics Concern   Not on file  Social History Narrative   Not on file   Social Determinants of Health   Financial Resource Strain: Not on file  Food Insecurity: Not on file  Transportation Needs: Not on file  Physical Activity: Not on file  Stress: Not on file  Social Connections: Not on file     Family History: The patient's ***family history includes Heart attack in his maternal grandfather and paternal grandmother; Heart attack (age of onset: 23) in his father; Hypertension in his brother and sister; Stomach cancer in his brother; Stroke in his brother, maternal grandmother, and mother.  ROS:   All other ROS reviewed and negative. Pertinent positives noted in the HPI.     EKGs/Labs/Other Studies  Reviewed:   The following studies were personally reviewed by me today:  EKG:  EKG is *** ordered today.  The ekg ordered today demonstrates ***, and was personally reviewed by me.   TTE 09/07/2020  1. Left ventricular ejection fraction, by estimation, is 60 to 65%. The  left ventricle has normal function. The left ventricle has no regional  wall motion abnormalities. There is severe left ventricular hypertrophy.  Left ventricular diastolic parameters   are consistent with Grade I diastolic dysfunction (impaired relaxation).   2. Right ventricular systolic function is normal. The right ventricular  size is normal. Tricuspid regurgitation signal is inadequate for assessing  PA pressure.   3. The mitral  valve is normal in structure. No evidence of mitral valve  regurgitation. No evidence of mitral stenosis.   4. The aortic valve has an indeterminant number of cusps. Aortic valve  regurgitation is not visualized. No aortic stenosis is present.   Recent Labs: 09/18/2020: Magnesium 2.1 09/20/2020: ALT 25; BUN 23; Creatinine, Ser 1.57; Hemoglobin 14.8; Platelets 211; Potassium 3.9; Sodium 136   Recent Lipid Panel    Component Value Date/Time   CHOL 115 09/16/2020 0406   TRIG 77 09/16/2020 0406   HDL 33 (L) 09/16/2020 0406   CHOLHDL 3.5 09/16/2020 0406   VLDL 15 09/16/2020 0406   LDLCALC 67 09/16/2020 0406    Physical Exam:   VS:  There were no vitals taken for this visit.   Wt Readings from Last 3 Encounters:  12/01/20 204 lb 9.6 oz (92.8 kg)  10/17/20 190 lb 12.8 oz (86.5 kg)  10/10/20 196 lb 13.9 oz (89.3 kg)    General: Well nourished, well developed, in no acute distress Head: Atraumatic, normal size  Eyes: PEERLA, EOMI  Neck: Supple, no JVD Endocrine: No thryomegaly Cardiac: Normal S1, S2; RRR; no murmurs, rubs, or gallops Lungs: Clear to auscultation bilaterally, no wheezing, rhonchi or rales  Abd: Soft, nontender, no hepatomegaly  Ext: No edema, pulses 2+ Musculoskeletal: No deformities, BUE and BLE strength normal and equal Skin: Warm and dry, no rashes   Neuro: Alert and oriented to person, place, time, and situation, CNII-XII grossly intact, no focal deficits  Psych: Normal mood and affect   ASSESSMENT:   Craig Knight is a 55 y.o. male who presents for the following: No diagnosis found.  PLAN:   There are no diagnoses linked to this encounter.  {Are you ordering a CV Procedure (e.g. stress test, cath, DCCV, TEE, etc)?   Press F2        :[id number]}  Disposition: No follow-ups on file.  Medication Adjustments/Labs and Tests Ordered: Current medicines are reviewed at length with the patient today.  Concerns regarding medicines are outlined above.  No orders  of the defined types were placed in this encounter.  No orders of the defined types were placed in this encounter.   There are no Patient Instructions on file for this visit.   Time Spent with Patient: I have spent a total of *** minutes with patient reviewing hospital notes, telemetry, EKGs, labs and examining the patient as well as establishing an assessment and plan that was discussed with the patient.  > 50% of time was spent in direct patient care.  Signed, Addison Naegeli. Audie Box, MD, [place]  [address], [place], [place] [address] [phone]  01/07/2021 3:06 PM

## 2021-01-08 ENCOUNTER — Ambulatory Visit: Payer: Medicare Other | Admitting: Cardiovascular Disease

## 2021-01-08 DIAGNOSIS — I10 Essential (primary) hypertension: Secondary | ICD-10-CM

## 2021-01-08 DIAGNOSIS — I639 Cerebral infarction, unspecified: Secondary | ICD-10-CM

## 2021-01-08 DIAGNOSIS — E782 Mixed hyperlipidemia: Secondary | ICD-10-CM

## 2021-01-17 ENCOUNTER — Encounter: Payer: Self-pay | Admitting: *Deleted

## 2021-01-24 ENCOUNTER — Ambulatory Visit: Payer: Medicare Other | Admitting: Adult Health

## 2021-01-25 ENCOUNTER — Encounter: Payer: Medicare Other | Admitting: Physical Medicine & Rehabilitation

## 2021-02-27 ENCOUNTER — Encounter: Payer: Medicare Other | Attending: Registered Nurse | Admitting: Physical Medicine & Rehabilitation

## 2021-02-27 DIAGNOSIS — E1142 Type 2 diabetes mellitus with diabetic polyneuropathy: Secondary | ICD-10-CM | POA: Insufficient documentation

## 2021-02-27 DIAGNOSIS — I639 Cerebral infarction, unspecified: Secondary | ICD-10-CM | POA: Insufficient documentation

## 2021-02-27 DIAGNOSIS — I10 Essential (primary) hypertension: Secondary | ICD-10-CM | POA: Insufficient documentation

## 2021-03-14 ENCOUNTER — Ambulatory Visit: Payer: Medicare Other | Admitting: Adult Health

## 2021-03-16 ENCOUNTER — Ambulatory Visit: Payer: Medicare Other | Admitting: Internal Medicine

## 2021-03-22 ENCOUNTER — Ambulatory Visit: Payer: Medicare Other | Admitting: Adult Health

## 2021-03-29 ENCOUNTER — Other Ambulatory Visit: Payer: Self-pay

## 2021-03-29 ENCOUNTER — Encounter: Payer: Self-pay | Admitting: Physical Medicine & Rehabilitation

## 2021-03-29 ENCOUNTER — Encounter: Payer: Medicare Other | Attending: Registered Nurse | Admitting: Physical Medicine & Rehabilitation

## 2021-03-29 VITALS — BP 146/85 | HR 67 | Ht 68.0 in

## 2021-03-29 DIAGNOSIS — I639 Cerebral infarction, unspecified: Secondary | ICD-10-CM | POA: Insufficient documentation

## 2021-03-29 NOTE — Progress Notes (Signed)
Subjective:    Patient ID: Craig Knight, male    DOB: 1965-04-30, 56 y.o.   MRN: [id number]  56 y.o. male with history of hypertension, obesity, COPD with tobacco abuse diabetes mellitus diastolic congestive heart failure CKD stage III with recent admission 09/06/2020 to 09/08/2020 for right PCA territory infarction with residual mild right side weakness.  Maintained on aspirin as well as Brilinta x30 days then aspirin 81 mg daily and Plavix 75 mg daily.  He was discharged to home minimal guard.  Patient lives with spouse and daughter.  Presented 09/15/2020 to [facility] nonspecific shortness of breath worsening right side weakness.  MRI of the brain showed acute left basal ganglia infarction.  Evolving subacute posterior circulation infarct without extension from 09/07/2020.  Moderate chronic small vessel ischemic changes.  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  There appeared to possibly have progressed since prior studies however difficult to compare given the amount of motion on the MRA.  CTA head neck no emergent large vessel occlusion.  Incidental findings of thyroid nodule follow-up outpatient.  Echocardiogram with ejection fraction of 60 to 65% no wall motion abnormalities.  TEE without PFO.  Admission chemistries unremarkable except BUN 33 creatinine 2.01 hemoglobin A1c 7.4.  Maintained on aspirin 81 mg daily and Brilinta 90 mg twice daily for CVA prophylaxis x30 days and back to aspirin and Plavix.  Subcutaneous Lovenox for DVT prophylaxis. Admit date: 09/19/2020 Discharge date: 10/13/2020 admitted to rehab 09/19/2020 for inpatient therapies to consist of PT, ST and OT at least three hours five days a week. Past admission physiatrist, therapy team and rehab RN have worked together to provide customized collaborative inpatient rehab.  Pertaining to patient's left corona radiata infarction with evolving subacute posterior circulation infarct he  currently remained on aspirin and Brilinta for 30 days then back to aspirin and Plavix DAPT at the recommendation of neurology services.  He had refused loop recorder a Zio patch was placed.  Mood stabilization trazodone at nighttime as well as low-dose Celexa.  Emotional support provided.  Blood sugars controlled hemoglobin A1c 7.4 with full diabetic teaching.   HPI Patient returns today accompanied by his wife.  He is still requiring assistance with ADLs and mobility.  The patient states that he still is unable to use the right hand.  He would like to resume therapy if possible Last OV 12/01/20 with NP Last PT note 12/21/20 amb CGA 40' with RW Last OT note 12/20/20, no functional movement RUE  RIght foot swelling treated by PCP with diuretic    Pain Inventory Average Pain 8 Pain Right Now 8 My pain is constant, dull, and spasms  In the last 24 hours, has pain interfered with the following? General activity 8 Relation with others 8 Enjoyment of life 8 What TIME of day is your pain at its worst? varies Sleep (in general) Poor  Pain is worse with: unsure Pain improves with: medication Relief from Meds: 0  Family History  Problem Relation Age of Onset   Stroke Mother    Heart attack Father 21       CABG   Hypertension Sister    Stroke Brother    Hypertension Brother    Stomach cancer Brother    Stroke Maternal Grandmother    Heart attack Maternal Grandfather    Heart attack Paternal Grandmother    Social History   Socioeconomic History   Marital status: Married    Spouse  name: Not on file   Number of children: Not on file   Years of education: Not on file   Highest education level: Not on file  Occupational History   Not on file  Tobacco Use   Smoking status: Every Day    Packs/day: 0.25    Types: Cigarettes   Smokeless tobacco: Never   Tobacco comments:    Quitting per patient  Vaping Use   Vaping Use: Never used  Substance and Sexual Activity   Alcohol use:  Yes    Alcohol/week: 0.0 standard drinks    Comment: occasionally   Drug use: No   Sexual activity: Yes    Birth control/protection: None  Other Topics Concern   Not on file  Social History Narrative   Not on file   Social Determinants of Health   Financial Resource Strain: Not on file  Food Insecurity: Not on file  Transportation Needs: Not on file  Physical Activity: Not on file  Stress: Not on file  Social Connections: Not on file   Past Surgical History:  Procedure Laterality Date   BUBBLE STUDY  09/18/2020   Procedure: BUBBLE STUDY;  Surgeon: Pixie Casino, MD;  Location: [place];  Service: Cardiovascular;;   COLONOSCOPY N/A 09/18/2016   Procedure: COLONOSCOPY;  Surgeon: Danie Binder, MD;  [facility];  Service: Endoscopy;  Laterality: N/A;  10:30 AM   lipoma removal     TEE WITHOUT CARDIOVERSION N/A 09/18/2020   Procedure: TRANSESOPHAGEAL ECHOCARDIOGRAM (TEE);  Surgeon: Pixie Casino, MD;  Location: [facility] ENDOSCOPY;  Service: Cardiovascular;  Laterality: N/A;   Past Surgical History:  Procedure Laterality Date   BUBBLE STUDY  09/18/2020   Procedure: BUBBLE STUDY;  Surgeon: Pixie Casino, MD;  Location: [place];  Service: Cardiovascular;;   COLONOSCOPY N/A 09/18/2016   Procedure: COLONOSCOPY;  Surgeon: Danie Binder, MD;  [facility];  Service: Endoscopy;  Laterality: N/A;  10:30 AM   lipoma removal     TEE WITHOUT CARDIOVERSION N/A 09/18/2020   Procedure: TRANSESOPHAGEAL ECHOCARDIOGRAM (TEE);  Surgeon: Pixie Casino, MD;  Location: [facility] ENDOSCOPY;  Service: Cardiovascular;  Laterality: N/A;   Past Medical History:  Diagnosis Date   Asthma    Bronchitis    COPD (chronic obstructive pulmonary disease) ([place])    Diabetes mellitus without complication ([place])    Heart murmur 63/33/5456   soft systolic murmur 1/6   Hypertension    Sleep apnea    Stroke (HCC)    BP (!) 146/85    Pulse 67    Ht 5\' 8"  (1.727 m)    SpO2 96%    BMI  31.11 kg/m   Opioid Risk Score:   Fall Risk Score:  `1  Depression screen PHQ 2/9  Depression screen [facility] 2/9 03/29/2021 12/01/2020 07/03/2016 07/03/2016 07/03/2016  Decreased Interest 0 0 2 1 2   Down, Depressed, Hopeless 0 0 2 3 3   PHQ - 2 Score 0 0 4 4 5   Altered sleeping - 0 3 - -  Tired, decreased energy - 1 2 - -  Change in appetite - 0 0 - -  Feeling bad or failure about yourself  - 0 0 - -  Trouble concentrating - 0 0 - -  Moving slowly or fidgety/restless - 0 0 - -  Suicidal thoughts - 0 0 - -  PHQ-9 Score - 1 9 - -     Review of Systems  Constitutional: Negative.  HENT: Negative.    Eyes: Negative.   Respiratory: Negative.    Cardiovascular: Negative.   Gastrointestinal: Negative.   Endocrine: Negative.   Genitourinary: Negative.   Musculoskeletal: Negative.   Skin: Negative.   Allergic/Immunologic: Negative.   Neurological:  Positive for weakness.  Hematological: Negative.   Psychiatric/Behavioral:  Positive for sleep disturbance.       Objective:   Physical Exam Vitals and nursing note reviewed.  Constitutional:      Appearance: He is obese.  HENT:     Head: Normocephalic and atraumatic.  Eyes:     Extraocular Movements: Extraocular movements intact.     Conjunctiva/sclera: Conjunctivae normal.     Pupils: Pupils are equal, round, and reactive to light.  Neurological:     Mental Status: He is alert and oriented to person, place, and time.     Comments: Motor strength is 0/5 in the right deltoid bicep tricep grip 4 - at the knee extensors 3 - at the hip flexor trace ankle dorsiflexor on the right side 5/5 in the left deltoid, bicep, tricep, grip, hip flexor, knee extensor, ankle dorsiflexor and plantar flexor Speech without dysarthria or aphasia.  Psychiatric:        Mood and Affect: Mood normal.        Behavior: Behavior normal.   Decreased external rotation right shoulder accompanied by pain at around 10 degrees of external rotation       Assessment  & Plan:   1.  Left basal ganglia infarct with right hemiparesis, as discussed with patient and wife is unlikely that he will get functional movement of the right upper extremity given that he is 6 months post stroke and after extensive rehab has not been able to achieve this.  He is getting some tightness in the right shoulder and I do think some additional OT would be helpful to prevent frozen shoulder. Patient still has potential for improvement in gait.  We will make referral to outpatient PT at [facility].  I will see the patient back in approximately 3 months.

## 2021-03-29 NOTE — Patient Instructions (Signed)
Please bring cane or walker to next visit

## 2021-04-03 ENCOUNTER — Ambulatory Visit (HOSPITAL_COMMUNITY): Payer: Medicare Other | Admitting: Physical Therapy

## 2021-04-05 ENCOUNTER — Ambulatory Visit (HOSPITAL_COMMUNITY): Payer: Medicare Other | Admitting: Physical Therapy

## 2021-04-06 ENCOUNTER — Other Ambulatory Visit: Payer: Self-pay

## 2021-04-06 ENCOUNTER — Ambulatory Visit (HOSPITAL_COMMUNITY): Payer: Medicare Other | Attending: Physical Medicine & Rehabilitation | Admitting: Physical Therapy

## 2021-04-06 ENCOUNTER — Encounter (HOSPITAL_COMMUNITY): Payer: Self-pay | Admitting: Physical Therapy

## 2021-04-06 DIAGNOSIS — M6281 Muscle weakness (generalized): Secondary | ICD-10-CM | POA: Diagnosis not present

## 2021-04-06 DIAGNOSIS — R2689 Other abnormalities of gait and mobility: Secondary | ICD-10-CM | POA: Diagnosis present

## 2021-04-06 DIAGNOSIS — R278 Other lack of coordination: Secondary | ICD-10-CM | POA: Diagnosis present

## 2021-04-06 DIAGNOSIS — I639 Cerebral infarction, unspecified: Secondary | ICD-10-CM | POA: Diagnosis present

## 2021-04-06 DIAGNOSIS — M79601 Pain in right arm: Secondary | ICD-10-CM | POA: Insufficient documentation

## 2021-04-06 DIAGNOSIS — R29818 Other symptoms and signs involving the nervous system: Secondary | ICD-10-CM | POA: Insufficient documentation

## 2021-04-06 DIAGNOSIS — R262 Difficulty in walking, not elsewhere classified: Secondary | ICD-10-CM | POA: Diagnosis present

## 2021-04-06 NOTE — Therapy (Signed)
[place], [place], [address] Phone: [phone]   Fax:  [phone]  Physical Therapy Evaluation  Patient Details  Name: Craig Knight MRN: [id number] Date of Birth: 1965/06/22 Referring Provider (PT): Alysia Penna MD   Encounter Date: 04/06/2021   PT End of Session - 04/06/21 1309     Visit Number 1    Number of Visits 16    Date for PT Re-Evaluation 06/01/21    Authorization Type UHC Medicare    Progress Note Due on Visit 10    PT Start Time 1315    PT Stop Time 1400    PT Time Calculation (min) 45 min    Equipment Utilized During Treatment Gait belt    Activity Tolerance Patient limited by fatigue;Patient tolerated treatment well             Past Medical History:  Diagnosis Date   Asthma    Bronchitis    COPD (chronic obstructive pulmonary disease) ([place])    Diabetes mellitus without complication ([place])    Heart murmur 75/64/3329   soft systolic murmur 1/6   Hypertension    Sleep apnea    Stroke [facility])     Past Surgical History:  Procedure Laterality Date   BUBBLE STUDY  09/18/2020   Procedure: BUBBLE STUDY;  Surgeon: Pixie Casino, MD;  Location: [place];  Service: Cardiovascular;;   COLONOSCOPY N/A 09/18/2016   Procedure: COLONOSCOPY;  Surgeon: Danie Binder, MD;  [facility];  Service: Endoscopy;  Laterality: N/A;  10:30 AM   lipoma removal     TEE WITHOUT CARDIOVERSION N/A 09/18/2020   Procedure: TRANSESOPHAGEAL ECHOCARDIOGRAM (TEE);  Surgeon: Pixie Casino, MD;  Location: [facility] ENDOSCOPY;  Service: Cardiovascular;  Laterality: N/A;    There were no vitals filed for this visit.    Subjective Assessment - 04/06/21 1302     Subjective Patient  s/p CVA 09/15/20. He has had inpatient therapy as well as 17 visits of OP therapy.  He had limited  progress during his outpatient therapy which was from 8/29 thru 1012 2022.  At discharge this therapist gave the pt a HEP  and requested that he completed them daily, and if he noted improvement to request to come back to therapy.  He completed the exercises daily with his wife and has increased strength in all mm.  Therefor, when he went to his  MD the MD felt that he had the potential to improve his functional mobility and referred him to physical therapy.    Patient is accompained by: Family member    Pertinent History CVA    Limitations Standing;Walking    How long can you sit comfortably? no problem    How long can you stand comfortably? minimal    How long can you walk comfortably? a few steps    Patient Stated Goals to walk    Currently in Pain? No/denies                [facility] PT Assessment - 04/06/21 0001       Assessment   Medical Diagnosis CVA    Referring Provider (PT) Alysia Penna MD    Onset Date/Surgical Date 09/15/20   patient reported   Prior Therapy Yes, inpatient      Home Environment   Living Environment Private residence    Living Arrangements Spouse/significant other;Children    Available Help at Discharge Family  Prior Function   Level of Independence Independent with household mobility without device      Strength   Right Hip Flexion 2/5   on 10/27 was 2-   Right Hip Extension 2-/5   was 1   Right Hip ABduction 2-/5   1/5   Right Hip ADduction 2/5   was 2   Left Hip Flexion 5/5    Right Knee Flexion 1/5   was 1/5   Right Knee Extension 2+/5   was 1/5   Right Ankle Dorsiflexion 1/5   was 0/5   Right Ankle Plantar Flexion 2-/5   was 1/5     Bed Mobility   Bed Mobility Sit to Supine;Supine to Sit    Rolling Right Minimal Assistance - Patient > 75%    Rolling Left Minimal Assistance - Patient > 75%    Right Sidelying to Sit Minimal Assistance - Patient > 75%    Supine to Sit Minimal Assistance - Patient > 75%    Sit to Supine Minimal Assistance - Patient > 75%      Transfers   Transfers Sit to [employer] Transfers    Sit to Stand 4: Min guard     Stand Pivot Transfers 4: Min guard                        Objective measurements completed on examination: See above findings.       [facility] Adult PT Treatment/Exercise - 04/06/21 0001       Knee/Hip Exercises: Seated   Long Arc Quad Right;10 reps    Long Arc Quad Limitations with tapping to excite mm      Knee/Hip Exercises: Supine   Heel Slides Right;10 reps    Hip Adduction Isometric Both;10 reps    Straight Leg Raises Right;5 reps    Other Supine Knee/Hip Exercises Dorsiflexion stretch with active plantarflexion x 10    Other Supine Knee/Hip Exercises hip ab/adducton x 5 ;  Bed mobility roll to RT x 5; LT x 5 sidelying to sit x 5                     PT Education - 04/06/21 1637     Education Details HEP    Person(s) Educated Patient;Spouse    Methods Explanation;Handout;Demonstration              PT Short Term Goals - 04/06/21 1649       PT SHORT TERM GOAL #1   Title Patient to be I in all bed mobilities    Time 3    Period Weeks    Status New    Target Date 04/27/21      PT SHORT TERM GOAL #2   Title Pt mm strength to increase 1/2 grade in Rt LE to allow pt to come sit to full standing position and stand for two minutes I witout UE assist .    Time 3    Period Weeks    Status New      PT SHORT TERM GOAL #3   Title patient to ambulate 76ft with LRAD and CGA    Time 3    Period Weeks    Status New               PT Long Term Goals - 04/06/21 1652       PT LONG TERM GOAL #1   Title Patient will be able to ambualte  100 feet using hemiwalker    Time 6    Period Weeks    Status New      PT LONG TERM GOAL #2   Title Pt mm strength will be increased one grade to allow pt to be able to step up 2 steps with a hand rail and Contace guard assist    Time 8    Period Weeks    Status New                    Plan - 04/06/21 1351     Clinical Impression Statement Pt is a 56 yo male who has had multiple CVA's.  He  is currently being referred for skilled therapy.  Evaluation demonstrates decreased bed mobility, decreased strength, decreased ankle ROM, decrease safety in transfers and inability to ambulate safely I.  Mr Ocallaghan will benefit from skilled PT to address these issues and improve his functioning ability.    Personal Factors and Comorbidities Time since onset of injury/illness/exacerbation;Fitness    Comorbidities multiple CVAs    Examination-Activity Limitations Transfers;Locomotion Level;Bed Mobility;Squat;Stairs;Stand    Examination-Participation Restrictions Community Activity;Driving;Cleaning    Stability/Clinical Decision Making Stable/Uncomplicated    Clinical Decision Making Low    Rehab Potential Fair    PT Frequency 2x / week    PT Duration 8 weeks    PT Treatment/Interventions ADLs/Self Care Home Management;Aquatic Therapy;DME Instruction;Gait training;Stair training;Functional mobility training;Therapeutic activities;Therapeutic exercise;Balance training;Neuromuscular re-education;Patient/family education;Orthotic Fit/Training;Passive range of motion;Wheelchair mobility training;Visual/perceptual remediation/compensation;Energy conservation;Spinal Manipulations;Splinting;Joint Manipulations;Taping;Vasopneumatic Device;Compression bandaging;Manual techniques;Dry needling;Cognitive remediation;Electrical Stimulation;Cryotherapy;Biofeedback;Moist Heat;Traction;Iontophoresis 4mg /ml Dexamethasone    PT Next Visit Plan Work on bed mobility, Mat exercises for strengthening, sit to stand standing balance.  Progress to gait with hemiwalkerl    PT Home Exercise Plan Eval: LAQ with tapping to excite mm,sitting hip adduction isometrically, attempt heel raise, toe raise;  Supine:  PROM for RT dorsiflexion, heel slide, hip ab/adduction, SLR and bridge.    Consulted and Agree with Plan of Care Patient;Family member/caregiver    Family Member Consulted wife             Patient will benefit from  skilled therapeutic intervention in order to improve the following deficits and impairments:  Abnormal gait, Difficulty walking, Decreased endurance, Decreased activity tolerance, Decreased balance, Decreased mobility, Decreased strength, Postural dysfunction  Visit Diagnosis: Muscle weakness (generalized)  Difficulty in walking, not elsewhere classified     Problem List Patient Active Problem List   Diagnosis Date Noted   Dysphagia, post-stroke    Essential hypertension    Diabetic peripheral neuropathy (HCC)    Infarction of left basal ganglia ([place]) 09/19/2020   Acute CVA (cerebrovascular accident) ([place]) 09/06/2020   Stroke ([place]) 11/23/2018   Numbness 11/23/2018   CVA (cerebral vascular accident) ([place]) 11/23/2018   Lung nodule 11/17/2017   Chest pressure 11/22/2016   Shortness of breath 11/22/2016   Depression, major, single episode, moderate ([place]) 10/14/2016   Special screening for malignant neoplasms, colon    Chronic obstructive pulmonary disease ([place]) 06/08/2016   Hypertensive emergency 02/16/2016   Hypertensive urgency 06/07/2015   CKD (chronic kidney disease), stage III ([place]) 06/07/2015   Pain in the chest    Elevated troponin    Chronic diastolic CHF (congestive heart failure) ([place]) 09/17/2013   DM type 2 (diabetes mellitus, type 2) ([place]) 09/15/2013   Chest pain 08/01/2011   HTN (hypertension) 08/01/2011   Chest pain 07/22/2011   Renal insufficiency 07/22/2011   Tobacco abuse 07/22/2011   Hypertension  Rayetta Humphrey, PT CLT [phone]  04/06/2021, 4:55 PM  [facility] [address] [place], [place], [address] Phone: [phone]   Fax:  [phone]  Name: Craig Knight MRN: [id number] Date of Birth: 08/01/1965

## 2021-04-12 ENCOUNTER — Encounter (HOSPITAL_COMMUNITY): Payer: Self-pay

## 2021-04-12 ENCOUNTER — Ambulatory Visit (HOSPITAL_COMMUNITY): Payer: Medicare Other

## 2021-04-12 ENCOUNTER — Other Ambulatory Visit: Payer: Self-pay

## 2021-04-12 DIAGNOSIS — R29818 Other symptoms and signs involving the nervous system: Secondary | ICD-10-CM

## 2021-04-12 DIAGNOSIS — R278 Other lack of coordination: Secondary | ICD-10-CM

## 2021-04-12 DIAGNOSIS — M79601 Pain in right arm: Secondary | ICD-10-CM

## 2021-04-12 DIAGNOSIS — M6281 Muscle weakness (generalized): Secondary | ICD-10-CM | POA: Diagnosis not present

## 2021-04-12 NOTE — Therapy (Signed)
[place], [place], [address] Phone: [phone]   Fax:  [phone]  Occupational Therapy Treatment  Patient Details  Name: Craig Knight MRN: [id number] Date of Birth: 20-Mar-1965 Referring Provider (OT): Alysia Penna, MD   Encounter Date: 04/12/2021   OT End of Session - 04/12/21 1700     Visit Number 1    Number of Visits 8    Date for OT Re-Evaluation 05/10/21    Authorization Type UHC Medicare    Authorization Time Period copay $20, no visit limit, authorization needed.    Progress Note Due on Visit 10    OT Start Time 0815    OT Stop Time 0859    OT Time Calculation (min) 44 min    Activity Tolerance Patient tolerated treatment well    Behavior During Therapy [facility] for tasks assessed/performed             Past Medical History:  Diagnosis Date   Asthma    Bronchitis    COPD (chronic obstructive pulmonary disease) ([place])    Diabetes mellitus without complication ([place])    Heart murmur 85/27/7824   soft systolic murmur 1/6   Hypertension    Sleep apnea    Stroke [facility])     Past Surgical History:  Procedure Laterality Date   BUBBLE STUDY  09/18/2020   Procedure: BUBBLE STUDY;  Surgeon: Pixie Casino, MD;  Location: [place];  Service: Cardiovascular;;   COLONOSCOPY N/A 09/18/2016   Procedure: COLONOSCOPY;  Surgeon: Danie Binder, MD;  [facility];  Service: Endoscopy;  Laterality: N/A;  10:30 AM   lipoma removal     TEE WITHOUT CARDIOVERSION N/A 09/18/2020   Procedure: TRANSESOPHAGEAL ECHOCARDIOGRAM (TEE);  Surgeon: Pixie Casino, MD;  Location: [facility] ENDOSCOPY;  Service: Cardiovascular;  Laterality: N/A;    There were no vitals filed for this visit.   Subjective Assessment - 04/12/21 0826     Subjective  S: I've gotten weak.    Patient is accompanied by: Family member   Wife: Craig Knight   Pertinent History Patient is a 56 y/o male returning to this clinic for OT for right  shoulder stiffness. 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 He received OT and PT at [facility] in [facility]. He then received outpatient OT service at this clinic 10/25/20-12/20/20.  At time of discharge patient overall met 3/6 therapy goals with 1 additional goal partially met. He was unable to demonstrate any functional movement in his RUE. Right shoulder presented with less than one finger subluxation. He presented with increased flexor tone in the shoulder, elbow, and wrist which causes him increased pain and decreased tolerance during passive movement. Limited to half range or less passively with the right shoulder, elbow, and wrist. Dr. Letta Pate has referred patient to occupational therapy for evaluation and treatment for right shoulder stiffness.    Patient Stated Goals to decrease the tightness in the arm.    Currently in Pain? No/denies                [facility] OT Assessment - 04/12/21 0826       Assessment   Medical Diagnosis right shoulder stiffness    Referring Provider (OT) Alysia Penna, MD    Onset Date/Surgical Date 09/11/20    Hand Dominance Right    Next MD Visit 06/26/21    Prior Therapy inpatient rehab, outpatient OT and PT services  Precautions   Precautions Other (comment)    Precaution Comments right side hemiplegia      Restrictions   Weight Bearing Restrictions No      Balance Screen   Has the patient fallen in the past 6 months No      Home  Environment   Family/patient expects to be discharged to: Private residence    Living Arrangements Spouse/significant other   19 y/o daughter   Additional Comments Uses a loan manual wheelchair from [employer] at this time.      Prior Function   Level of Independence Independent   prior to CVA     ADL   ADL comments Increased RUE stiffness is causing difficulty with dressing and bathing tasks when attempting to manage the upper extremity.      Mobility   Mobility  Status Needs assist      Written Expression   Dominant Hand Right      Vision - History   Baseline Vision Wears glasses only for reading      Cognition   Overall Cognitive Status Within Functional Limits for tasks assessed      Observation/Other Assessments   Focus on Therapeutic Outcomes (FOTO)  N/A      Posture/Postural Control   Posture/Postural Control Postural limitations    Postural Limitations Forward head;Rounded Shoulders;Increased lumbar lordosis;Decreased lumbar lordosis      ROM / Strength   AROM / PROM / Strength PROM;AROM;Strength      Palpation   Palpation comment 1 finger subluxation right shoulder      AROM   Overall AROM Comments No active movement demonstrated in the right UE.      PROM   Overall PROM Comments Assessed supine. IR/er adducted    PROM Assessment Site Shoulder;Elbow;Forearm;Wrist    Right/Left Shoulder Right    Right Shoulder Flexion 100 Degrees    Right Shoulder ABduction 85 Degrees    Right Shoulder Internal Rotation 90 Degrees    Right Shoulder External Rotation 0 Degrees    Right/Left Elbow Right    Right Elbow Flexion 140    Right Elbow Extension -15    Right/Left Forearm Right    Right Forearm Pronation 90 Degrees    Right Forearm Supination 86 Degrees    Right/Left Wrist Right    Right Wrist Extension 60 Degrees    Right Wrist Flexion 68 Degrees    Right Wrist Radial Deviation 20 Degrees    Right Wrist Ulnar Deviation 30 Degrees      Strength   Overall Strength Comments seated shoulder flexion: 1/5, shoulder extension: 1/5, elbow flexion/extension: 0/5, gross grasp: none.                              OT Education - 04/12/21 1657     Education Details Continue with self passive ROM. Due to shoulder subluxation do not attempt to range shoulder past 90 degrees during flexion and abduction (at home).    Person(s) Educated Patient;Spouse    Methods Explanation    Comprehension Verbalized understanding               OT Short Term Goals - 04/12/21 1722       OT SHORT TERM GOAL #1   Title Pt and caregiver will be educated on HEP to improve functional mobility of the RUE and allow for decreased difficulty during ADL completion.    Time 4    Period  Weeks    Status New    Target Date 05/10/21      OT SHORT TERM GOAL #2   Title Patient will increase shoulder, elbow, and wrist P/ROM by 10 degrees or more where needed in order to decrease difficulty when participating in bathing and dressing tasks.    Time 4    Period Weeks    Status New                      Plan - 04/12/21 1701     Clinical Impression Statement A: Pt is a 56 y/o male S/P right side hemiplegia due to CVA sustained in July 2022. Returning to clinic with increased RUE stiffness causing increased tone and pain and decreased ROM resulting in increased difficulty during dressing and bathing tasks with his wife assisting. Patient and wife, Craig Knight report that they ordered a NMES device from [facility] and have been using it on his leg and arm. They also ordered a Neuroball by [employer].    OT Occupational Profile and History Problem Focused Assessment - Including review of records relating to presenting problem    Occupational performance deficits (Please refer to evaluation for details): ADL's    Body Structure / Function / Physical Skills Pain;ROM    Rehab Potential Good    Clinical Decision Making Several treatment options, min-mod task modification necessary    Comorbidities Affecting Occupational Performance: Presence of comorbidities impacting occupational performance    Comorbidities impacting occupational performance description: see medical chart for details. minimal improvement in RUE gained during previous outpatient OT duration.    Modification or Assistance to Complete Evaluation  Min-Moderate modification of tasks or assist with assess necessary to complete eval    OT Frequency 2x / week    OT Duration 4  weeks    OT Treatment/Interventions Self-care/ADL training;Ultrasound;Patient/family education;DME and/or AE instruction;Passive range of motion;Electrical Stimulation;Moist Heat;Therapeutic exercise;Manual Therapy;Therapeutic activities;Neuromuscular education;Splinting    Plan P: Pt will benefit from skilled OT services to increase P/ROM of the RUE and allow increased comfort when completing basic ADL tasks. Treatment plan: myofascial release, passive stretching. treatment strategy: wrist flossing and neck flossing. Provide more specific HEP at home. Modalities PRN. If this round of OT does not improve RUE, recommend patient discuss Botox injections with Dr. Letta Pate.    OT Home Exercise Plan eval: continue with current HEP.    Consulted and Agree with Plan of Care Patient;Family member/caregiver    Family Member Consulted Wife, Craig Knight             Patient will benefit from skilled therapeutic intervention in order to improve the following deficits and impairments:   Body Structure / Function / Physical Skills: Pain, ROM       Visit Diagnosis: Other symptoms and signs involving the nervous system - Plan: Ot plan of care cert/re-cert  Other lack of coordination - Plan: Ot plan of care cert/re-cert  Pain in right arm - Plan: Ot plan of care cert/re-cert    Problem List Patient Active Problem List   Diagnosis Date Noted   Dysphagia, post-stroke    Essential hypertension    Diabetic peripheral neuropathy (HCC)    Infarction of left basal ganglia ([place]) 09/19/2020   Acute CVA (cerebrovascular accident) ([place]) 09/06/2020   Stroke ([place]) 11/23/2018   Numbness 11/23/2018   CVA (cerebral vascular accident) ([place]) 11/23/2018   Lung nodule 11/17/2017   Chest pressure 11/22/2016   Shortness of breath 11/22/2016   Depression, major,  single episode, moderate ([place]) 10/14/2016   Special screening for malignant neoplasms, colon    Chronic obstructive pulmonary disease ([place]) 06/08/2016    Hypertensive emergency 02/16/2016   Hypertensive urgency 06/07/2015   CKD (chronic kidney disease), stage III ([place]) 06/07/2015   Pain in the chest    Elevated troponin    Chronic diastolic CHF (congestive heart failure) ([place]) 09/17/2013   DM type 2 (diabetes mellitus, type 2) ([place]) 09/15/2013   Chest pain 08/01/2011   HTN (hypertension) 08/01/2011   Chest pain 07/22/2011   Renal insufficiency 07/22/2011   Tobacco abuse 07/22/2011   Hypertension    Ailene Ravel, OTR/L,CBIS  [phone]  04/12/2021, 5:26 PM  [facility] [address] [place], [place], [address] Phone: [phone]   Fax:  [phone]  Name: Craig Knight MRN: [id number] Date of Birth: 01-20-1966

## 2021-04-18 ENCOUNTER — Ambulatory Visit (HOSPITAL_COMMUNITY): Payer: Medicare Other | Admitting: Physical Therapy

## 2021-04-18 ENCOUNTER — Other Ambulatory Visit: Payer: Self-pay

## 2021-04-18 DIAGNOSIS — M6281 Muscle weakness (generalized): Secondary | ICD-10-CM | POA: Diagnosis not present

## 2021-04-18 DIAGNOSIS — R2689 Other abnormalities of gait and mobility: Secondary | ICD-10-CM

## 2021-04-18 DIAGNOSIS — I639 Cerebral infarction, unspecified: Secondary | ICD-10-CM

## 2021-04-18 DIAGNOSIS — R262 Difficulty in walking, not elsewhere classified: Secondary | ICD-10-CM

## 2021-04-18 NOTE — Therapy (Signed)
[place], [place], [address] Phone: [phone]   Fax:  [phone]  Physical Therapy Treatment  Patient Details  Name: Craig Knight MRN: [id number] Date of Birth: 05-13-1965 Referring Provider (PT): Alysia Penna MD   Encounter Date: 04/18/2021   PT End of Session - 04/18/21 1130     Visit Number 2    Number of Visits 16    Date for PT Re-Evaluation 06/01/21    Authorization Type UHC Medicare    Progress Note Due on Visit 10    PT Start Time 1058    PT Stop Time 1136    PT Time Calculation (min) 38 min    Equipment Utilized During Treatment Gait belt    Activity Tolerance Patient limited by fatigue;Patient tolerated treatment well             Past Medical History:  Diagnosis Date   Asthma    Bronchitis    COPD (chronic obstructive pulmonary disease) ([place])    Diabetes mellitus without complication ([place])    Heart murmur 31/51/7616   soft systolic murmur 1/6   Hypertension    Sleep apnea    Stroke [facility])     Past Surgical History:  Procedure Laterality Date   BUBBLE STUDY  09/18/2020   Procedure: BUBBLE STUDY;  Surgeon: Pixie Casino, MD;  Location: [place];  Service: Cardiovascular;;   COLONOSCOPY N/A 09/18/2016   Procedure: COLONOSCOPY;  Surgeon: Danie Binder, MD;  [facility];  Service: Endoscopy;  Laterality: N/A;  10:30 AM   lipoma removal     TEE WITHOUT CARDIOVERSION N/A 09/18/2020   Procedure: TRANSESOPHAGEAL ECHOCARDIOGRAM (TEE);  Surgeon: Pixie Casino, MD;  Location: [facility] ENDOSCOPY;  Service: Cardiovascular;  Laterality: N/A;    There were no vitals filed for this visit.   Subjective Assessment - 04/18/21 1058     Subjective PT states that he has been doing his exercises.    Patient is accompained by: Family member    Pertinent History CVA    Limitations Standing;Walking    How long can you sit comfortably? no problem    How long can you stand  comfortably? minimal    How long can you walk comfortably? a few steps    Patient Stated Goals to walk                               [facility] Adult PT Treatment/Exercise - 04/18/21 0001       Bed Mobility   Bed Mobility --   prone to LT side lying x 4   Rolling Right Supervision/verbal cueing   x4   Rolling Left Contact Guard/Touching assist   x4     Transfers   Transfers Sit to Stand;Stand Pivot Transfers    Sit to Stand 4: Min guard  x 5 vc to keep wt on RT    Stand Pivot Transfers 5: Supervision      Ambulation/Gait   Gait Comments tall kneeling with wt shifting .      Knee/Hip Exercises: Supine   Heel Slides Right;10 reps    Bridges 10 reps    Straight Leg Raises Right;5 reps    Other Supine Knee/Hip Exercises Dorsiflexion stretch with active plantarflexion x 10    Other Supine Knee/Hip Exercises hip ab/adducton x 5 ;  Bed mobility roll to RT x 5; LT x 5 sidelying  to sit x 5      Knee/Hip Exercises: Sidelying   Hip ABduction Right;5 reps                       PT Short Term Goals - 04/18/21 1132       PT SHORT TERM GOAL #1   Title Patient to be I in all bed mobilities    Time 3    Period Weeks    Status On-going    Target Date 04/27/21      PT SHORT TERM GOAL #2   Title Pt mm strength to increase 1/2 grade in Rt LE to allow pt to come sit to full standing position and stand for two minutes I witout UE assist .    Time 3    Period Weeks    Status On-going      PT SHORT TERM GOAL #3   Title patient to ambulate 29ft with LRAD and CGA    Time 3    Period Weeks    Status On-going               PT Long Term Goals - 04/18/21 1133       PT LONG TERM GOAL #1   Title Patient will be able to ambualte 100 feet using hemiwalker    Time 6    Period Weeks    Status On-going      PT LONG TERM GOAL #2   Title Pt mm strength will be increased one grade to allow pt to be able to step up 2 steps with a hand rail and Contace guard  assist    Time 8    Period Weeks    Status On-going                   Plan - 04/18/21 1131     Clinical Impression Statement Worked with bed mobility, strengthening and ankle ROM.  Worked on developmental sequencing with tall knelling, attempted tall kneel gt but unable therefore worked on wt shifting with pt.  Pt fatigued following session.    Personal Factors and Comorbidities Time since onset of injury/illness/exacerbation;Fitness    Comorbidities multiple CVAs    Examination-Activity Limitations Transfers;Locomotion Level;Bed Mobility;Squat;Stairs;Stand    Examination-Participation Restrictions Community Activity;Driving;Cleaning    Stability/Clinical Decision Making Stable/Uncomplicated    Rehab Potential Fair    PT Frequency 2x / week    PT Duration 8 weeks    PT Treatment/Interventions ADLs/Self Care Home Management;Aquatic Therapy;DME Instruction;Gait training;Stair training;Functional mobility training;Therapeutic activities;Therapeutic exercise;Balance training;Neuromuscular re-education;Patient/family education;Orthotic Fit/Training;Passive range of motion;Wheelchair mobility training;Visual/perceptual remediation/compensation;Energy conservation;Spinal Manipulations;Splinting;Joint Manipulations;Taping;Vasopneumatic Device;Compression bandaging;Manual techniques;Dry needling;Cognitive remediation;Electrical Stimulation;Cryotherapy;Biofeedback;Moist Heat;Traction;Iontophoresis 4mg /ml Dexamethasone    PT Next Visit Plan Continue with I in  bed mobility, Mat exercises for strengthening, sit to stand standing balance.  Progress to gait with hemiwalkerl    PT Home Exercise Plan Eval: LAQ with tapping to excite mm,sitting hip adduction isometrically, attempt heel raise, toe raise;  Supine:  PROM for RT dorsiflexion, heel slide, hip ab/adduction, SLR and bridge.    Consulted and Agree with Plan of Care Patient;Family member/caregiver    Family Member Consulted wife              Patient will benefit from skilled therapeutic intervention in order to improve the following deficits and impairments:  Abnormal gait, Difficulty walking, Decreased endurance, Decreased activity tolerance, Decreased balance, Decreased mobility, Decreased strength, Postural dysfunction  Visit Diagnosis: Muscle weakness (generalized)  Acute CVA (cerebrovascular accident) ([place])  Difficulty in walking, not elsewhere classified  Other abnormalities of gait and mobility     Problem List Patient Active Problem List   Diagnosis Date Noted   Dysphagia, post-stroke    Essential hypertension    Diabetic peripheral neuropathy ([place])    Infarction of left basal ganglia ([place]) 09/19/2020   Acute CVA (cerebrovascular accident) ([place]) 09/06/2020   Stroke ([place]) 11/23/2018   Numbness 11/23/2018   CVA (cerebral vascular accident) ([place]) 11/23/2018   Lung nodule 11/17/2017   Chest pressure 11/22/2016   Shortness of breath 11/22/2016   Depression, major, single episode, moderate ([place]) 10/14/2016   Special screening for malignant neoplasms, colon    Chronic obstructive pulmonary disease ([place]) 06/08/2016   Hypertensive emergency 02/16/2016   Hypertensive urgency 06/07/2015   CKD (chronic kidney disease), stage III ([place]) 06/07/2015   Pain in the chest    Elevated troponin    Chronic diastolic CHF (congestive heart failure) ([place]) 09/17/2013   DM type 2 (diabetes mellitus, type 2) ([place]) 09/15/2013   Chest pain 08/01/2011   HTN (hypertension) 08/01/2011   Chest pain 07/22/2011   Renal insufficiency 07/22/2011   Tobacco abuse 07/22/2011   Hypertension    Rayetta Humphrey, [place] CLT [phone]  04/18/2021, 12:17 PM  [facility] [address] [place], [place], [address] Phone: [phone]   Fax:  [phone]  Name: Craig Knight MRN: [id number] Date of Birth: 01-22-1966

## 2021-04-19 ENCOUNTER — Ambulatory Visit (HOSPITAL_COMMUNITY): Payer: Medicare Other | Admitting: Physical Therapy

## 2021-04-19 DIAGNOSIS — R262 Difficulty in walking, not elsewhere classified: Secondary | ICD-10-CM

## 2021-04-19 DIAGNOSIS — M6281 Muscle weakness (generalized): Secondary | ICD-10-CM

## 2021-04-19 DIAGNOSIS — R2689 Other abnormalities of gait and mobility: Secondary | ICD-10-CM

## 2021-04-19 NOTE — Therapy (Signed)
[place], [place], [address] Phone: [phone]   Fax:  [phone]  Physical Therapy Treatment  Patient Details  Name: Craig Knight MRN: [id number] Date of Birth: Jul 13, 1965 Referring Provider (PT): Alysia Penna MD   Encounter Date: 04/19/2021   PT End of Session - 04/19/21 1412     Visit Number 3    Number of Visits 16    Date for PT Re-Evaluation 06/01/21    Authorization Type UHC Medicare    Progress Note Due on Visit 10    PT Start Time 1350    PT Stop Time 1430    PT Time Calculation (min) 40 min    Equipment Utilized During Treatment Gait belt    Activity Tolerance Patient limited by fatigue;Patient tolerated treatment well             Past Medical History:  Diagnosis Date   Asthma    Bronchitis    COPD (chronic obstructive pulmonary disease) ([place])    Diabetes mellitus without complication ([place])    Heart murmur 69/62/9528   soft systolic murmur 1/6   Hypertension    Sleep apnea    Stroke [facility])     Past Surgical History:  Procedure Laterality Date   BUBBLE STUDY  09/18/2020   Procedure: BUBBLE STUDY;  Surgeon: Pixie Casino, MD;  Location: [place];  Service: Cardiovascular;;   COLONOSCOPY N/A 09/18/2016   Procedure: COLONOSCOPY;  Surgeon: Danie Binder, MD;  [facility];  Service: Endoscopy;  Laterality: N/A;  10:30 AM   lipoma removal     TEE WITHOUT CARDIOVERSION N/A 09/18/2020   Procedure: TRANSESOPHAGEAL ECHOCARDIOGRAM (TEE);  Surgeon: Pixie Casino, MD;  Location: [facility] ENDOSCOPY;  Service: Cardiovascular;  Laterality: N/A;    There were no vitals filed for this visit.   Subjective Assessment - 04/19/21 1349     Subjective Pt was not sore from last treatment session ; states he is doing exercises at home.    Pertinent History CVA    Limitations Standing;Walking    How long can you sit comfortably? no problem    How long can you stand comfortably?  minimal    How long can you walk comfortably? a few steps    Patient Stated Goals to walk    Currently in Pain? No/denies                               [facility] Adult PT Treatment/Exercise - 04/19/21 0001       Bed Mobility   Rolling Right Supervision/verbal cueing   x4   Rolling Left Contact Guard/Touching assist   x4     Transfers   Transfers Sit to Stand;Stand Pivot Transfers    Sit to Stand 5: Supervision   foot on 2" step stand tall for 10 seconds prior to sitting down x10     Knee/Hip Exercises: Standing   Terminal Knee Extension Right;5 reps;2 sets    Other Standing Knee Exercises take right leg out at least as far out at least where Rt heel is at toes of LT foot x 10 needed 2 rest breaks      Knee/Hip Exercises: Seated   Long Arc Quad Right;10 reps    Long Arc Quad Limitations with tapping to excite mm; then AA into full extension and have pt hold position while therapist lets go of support to LE  Knee/Hip Exercises: Supine   Heel Slides Right;10 reps    Bridges 10 reps    Single Leg Bridge Right;5 reps    Other Supine Knee/Hip Exercises hip ab/adducton x 5 ;  Bed mobility roll to RT x 5; LT x 5 sidelying to sit x 5      Knee/Hip Exercises: Sidelying   Other Sidelying Knee/Hip Exercises knee flexion extenson.                       PT Short Term Goals - 04/18/21 1132       PT SHORT TERM GOAL #1   Title Patient to be I in all bed mobilities    Time 3    Period Weeks    Status On-going    Target Date 04/27/21      PT SHORT TERM GOAL #2   Title Pt mm strength to increase 1/2 grade in Rt LE to allow pt to come sit to full standing position and stand for two minutes I witout UE assist .    Time 3    Period Weeks    Status On-going      PT SHORT TERM GOAL #3   Title patient to ambulate 69ft with LRAD and CGA    Time 3    Period Weeks    Status On-going               PT Long Term Goals - 04/18/21 1133       PT LONG  TERM GOAL #1   Title Patient will be able to ambualte 100 feet using hemiwalker    Time 6    Period Weeks    Status On-going      PT LONG TERM GOAL #2   Title Pt mm strength will be increased one grade to allow pt to be able to step up 2 steps with a hand rail and Contace guard assist    Time 8    Period Weeks    Status On-going                   Plan - 04/19/21 1412     Clinical Impression Statement Treatment concentrated on advancing Rt LE and standing balance.  Pt requires multiple rest breaks.    Personal Factors and Comorbidities Time since onset of injury/illness/exacerbation;Fitness    Comorbidities multiple CVAs    Examination-Activity Limitations Transfers;Locomotion Level;Bed Mobility;Squat;Stairs;Stand    Examination-Participation Restrictions Community Activity;Driving;Cleaning    Stability/Clinical Decision Making Stable/Uncomplicated    Rehab Potential Fair    PT Frequency 2x / week    PT Duration 8 weeks    PT Treatment/Interventions ADLs/Self Care Home Management;Aquatic Therapy;DME Instruction;Gait training;Stair training;Functional mobility training;Therapeutic activities;Therapeutic exercise;Balance training;Neuromuscular re-education;Patient/family education;Orthotic Fit/Training;Passive range of motion;Wheelchair mobility training;Visual/perceptual remediation/compensation;Energy conservation;Spinal Manipulations;Splinting;Joint Manipulations;Taping;Vasopneumatic Device;Compression bandaging;Manual techniques;Dry needling;Cognitive remediation;Electrical Stimulation;Cryotherapy;Biofeedback;Moist Heat;Traction;Iontophoresis 4mg /ml Dexamethasone    PT Next Visit Plan Continue with I in  bed mobility, Mat exercises for strengthening, sit to stand standing balance.  Progress to gait with hemiwalkerl    PT Home Exercise Plan Eval: LAQ with tapping to excite mm,sitting hip adduction isometrically, attempt heel raise, toe raise;  Supine:  PROM for RT dorsiflexion,  heel slide, hip ab/adduction, SLR and bridge.    Consulted and Agree with Plan of Care Patient;Family member/caregiver    Family Member Consulted wife             Patient will benefit from skilled therapeutic intervention in order  to improve the following deficits and impairments:  Abnormal gait, Difficulty walking, Decreased endurance, Decreased activity tolerance, Decreased balance, Decreased mobility, Decreased strength, Postural dysfunction  Visit Diagnosis: Muscle weakness (generalized)  Difficulty in walking, not elsewhere classified  Other abnormalities of gait and mobility     Problem List Patient Active Problem List   Diagnosis Date Noted   Dysphagia, post-stroke    Essential hypertension    Diabetic peripheral neuropathy (HCC)    Infarction of left basal ganglia ([place]) 09/19/2020   Acute CVA (cerebrovascular accident) ([place]) 09/06/2020   Stroke ([place]) 11/23/2018   Numbness 11/23/2018   CVA (cerebral vascular accident) ([place]) 11/23/2018   Lung nodule 11/17/2017   Chest pressure 11/22/2016   Shortness of breath 11/22/2016   Depression, major, single episode, moderate ([place]) 10/14/2016   Special screening for malignant neoplasms, colon    Chronic obstructive pulmonary disease ([place]) 06/08/2016   Hypertensive emergency 02/16/2016   Hypertensive urgency 06/07/2015   CKD (chronic kidney disease), stage III ([place]) 06/07/2015   Pain in the chest    Elevated troponin    Chronic diastolic CHF (congestive heart failure) ([place]) 09/17/2013   DM type 2 (diabetes mellitus, type 2) ([place]) 09/15/2013   Chest pain 08/01/2011   HTN (hypertension) 08/01/2011   Chest pain 07/22/2011   Renal insufficiency 07/22/2011   Tobacco abuse 07/22/2011   Hypertension    Rayetta Humphrey, PT CLT [phone] 04/19/2021, 2:44 PM  [facility] [address] [place], [place], [address] Phone: [phone]   Fax:  [phone]  Name: NASHTON BELSON MRN: [id number] Date of Birth: 02-24-1966

## 2021-04-24 ENCOUNTER — Other Ambulatory Visit: Payer: Self-pay

## 2021-04-24 ENCOUNTER — Ambulatory Visit (HOSPITAL_COMMUNITY): Payer: Medicare Other | Admitting: Physical Therapy

## 2021-04-24 DIAGNOSIS — M6281 Muscle weakness (generalized): Secondary | ICD-10-CM | POA: Diagnosis not present

## 2021-04-24 DIAGNOSIS — R262 Difficulty in walking, not elsewhere classified: Secondary | ICD-10-CM

## 2021-04-24 DIAGNOSIS — R2689 Other abnormalities of gait and mobility: Secondary | ICD-10-CM

## 2021-04-24 NOTE — Therapy (Signed)
[place], [place], [address] Phone: [phone]   Fax:  [phone]  Physical Therapy Treatment  Patient Details  Name: Craig Knight MRN: [id number] Date of Birth: 1965-06-11 Referring Provider (PT): Alysia Penna MD   Encounter Date: 04/24/2021   PT End of Session - 04/24/21 1714     Visit Number 4    Number of Visits 16    Date for PT Re-Evaluation 06/01/21    Authorization Type UHC Medicare    Progress Note Due on Visit 10    PT Start Time 1530    PT Stop Time 1615    PT Time Calculation (min) 45 min    Activity Tolerance Patient limited by fatigue;Patient tolerated treatment well    Behavior During Therapy [facility] for tasks assessed/performed             Past Medical History:  Diagnosis Date   Asthma    Bronchitis    COPD (chronic obstructive pulmonary disease) ([place])    Diabetes mellitus without complication ([place])    Heart murmur 05/16/2246   soft systolic murmur 1/6   Hypertension    Sleep apnea    Stroke [facility])     Past Surgical History:  Procedure Laterality Date   BUBBLE STUDY  09/18/2020   Procedure: BUBBLE STUDY;  Surgeon: Pixie Casino, MD;  Location: [place];  Service: Cardiovascular;;   COLONOSCOPY N/A 09/18/2016   Procedure: COLONOSCOPY;  Surgeon: Danie Binder, MD;  [facility];  Service: Endoscopy;  Laterality: N/A;  10:30 AM   lipoma removal     TEE WITHOUT CARDIOVERSION N/A 09/18/2020   Procedure: TRANSESOPHAGEAL ECHOCARDIOGRAM (TEE);  Surgeon: Pixie Casino, MD;  Location: [facility] ENDOSCOPY;  Service: Cardiovascular;  Laterality: N/A;    There were no vitals filed for this visit.   Subjective Assessment - 04/24/21 1525     Subjective Pt has no complaint    Pertinent History CVA    Limitations Standing;Walking    How long can you sit comfortably? no problem    How long can you stand comfortably? minimal    How long can you walk comfortably? a few  steps    Patient Stated Goals to walk                               [facility] Adult PT Treatment/Exercise - 04/24/21 0001       Bed Mobility   Rolling Right Supervision/verbal cueing   x4   Rolling Left Supervision/Verbal cueing   x4     Transfers   Transfers Sit to Stand    Sit to Stand 5: Supervision   foot on 2" step stand tall for 10 seconds prior to sitting down x10     Exercises   Exercises Knee/Hip      Knee/Hip Exercises: Stretches   Other Knee/Hip Stretches PROM for ankle dorsiflexion hold 10" x 5      Knee/Hip Exercises: Standing   Terminal Knee Extension --    Other Standing Knee Exercises --      Knee/Hip Exercises: Seated   Long Arc Quad Right;15 reps    Long Arc Quad Limitations with vibration gun      Knee/Hip Exercises: Supine   Heel Slides Right;10 reps    Bridges 10 reps    Single Leg Bridge Right;5 reps    Straight Leg Raises Right;5 reps  Other Supine Knee/Hip Exercises hip ab/adducton x 5 ;  Bed mobility roll to RT x 5; LT x 5 sidelying to sit x 5      Knee/Hip Exercises: Sidelying   Hip ABduction Right;5 reps    Other Sidelying Knee/Hip Exercises knee flexion extenson.      Knee/Hip Exercises: Prone   Other Prone Exercises attempt to come to tripod unsuccessful    Other Prone Exercises glut sets; heel squeezes                       PT Short Term Goals - 04/18/21 1132       PT SHORT TERM GOAL #1   Title Patient to be I in all bed mobilities    Time 3    Period Weeks    Status On-going    Target Date 04/27/21      PT SHORT TERM GOAL #2   Title Pt mm strength to increase 1/2 grade in Rt LE to allow pt to come sit to full standing position and stand for two minutes I witout UE assist .    Time 3    Period Weeks    Status On-going      PT SHORT TERM GOAL #3   Title patient to ambulate 59ft with LRAD and CGA    Time 3    Period Weeks    Status On-going               PT Long Term Goals - 04/18/21  1133       PT LONG TERM GOAL #1   Title Patient will be able to ambualte 100 feet using hemiwalker    Time 6    Period Weeks    Status On-going      PT LONG TERM GOAL #2   Title Pt mm strength will be increased one grade to allow pt to be able to step up 2 steps with a hand rail and Contace guard assist    Time 8    Period Weeks    Status On-going                   Plan - 04/24/21 1715     Clinical Impression Statement Noted improved excitment of quadricep and hamstring mm with vibration gun.  Therapist attempted to come into tripod position, however pt to fatigued this session.    Personal Factors and Comorbidities Time since onset of injury/illness/exacerbation;Fitness    Comorbidities multiple CVAs    Examination-Activity Limitations Transfers;Locomotion Level;Bed Mobility;Squat;Stairs;Stand    Examination-Participation Restrictions Community Activity;Driving;Cleaning    Stability/Clinical Decision Making Stable/Uncomplicated    Rehab Potential Fair    PT Frequency 2x / week    PT Duration 8 weeks    PT Treatment/Interventions ADLs/Self Care Home Management;Aquatic Therapy;DME Instruction;Gait training;Stair training;Functional mobility training;Therapeutic activities;Therapeutic exercise;Balance training;Neuromuscular re-education;Patient/family education;Orthotic Fit/Training;Passive range of motion;Wheelchair mobility training;Visual/perceptual remediation/compensation;Energy conservation;Spinal Manipulations;Splinting;Joint Manipulations;Taping;Vasopneumatic Device;Compression bandaging;Manual techniques;Dry needling;Cognitive remediation;Electrical Stimulation;Cryotherapy;Biofeedback;Moist Heat;Traction;Iontophoresis 4mg /ml Dexamethasone    PT Next Visit Plan Attempt tripod first thing before pt fatigues.  Continue with I in  bed mobility, Mat exercises for strengthening, sit to stand standing balance.  Progress to gait with hemiwalkerl    PT Home Exercise Plan Eval:  LAQ with tapping to excite mm,sitting hip adduction isometrically, attempt heel raise, toe raise;  Supine:  PROM for RT dorsiflexion, heel slide, hip ab/adduction, SLR and bridge.    Consulted and Agree with Plan of Care Patient;Family member/caregiver  Family Member Consulted wife             Patient will benefit from skilled therapeutic intervention in order to improve the following deficits and impairments:  Abnormal gait, Difficulty walking, Decreased endurance, Decreased activity tolerance, Decreased balance, Decreased mobility, Decreased strength, Postural dysfunction  Visit Diagnosis: Muscle weakness (generalized)  Difficulty in walking, not elsewhere classified  Other abnormalities of gait and mobility     Problem List Patient Active Problem List   Diagnosis Date Noted   Dysphagia, post-stroke    Essential hypertension    Diabetic peripheral neuropathy (HCC)    Infarction of left basal ganglia ([place]) 09/19/2020   Acute CVA (cerebrovascular accident) ([place]) 09/06/2020   Stroke ([place]) 11/23/2018   Numbness 11/23/2018   CVA (cerebral vascular accident) ([place]) 11/23/2018   Lung nodule 11/17/2017   Chest pressure 11/22/2016   Shortness of breath 11/22/2016   Depression, major, single episode, moderate ([place]) 10/14/2016   Special screening for malignant neoplasms, colon    Chronic obstructive pulmonary disease ([place]) 06/08/2016   Hypertensive emergency 02/16/2016   Hypertensive urgency 06/07/2015   CKD (chronic kidney disease), stage III ([place]) 06/07/2015   Pain in the chest    Elevated troponin    Chronic diastolic CHF (congestive heart failure) ([place]) 09/17/2013   DM type 2 (diabetes mellitus, type 2) ([place]) 09/15/2013   Chest pain 08/01/2011   HTN (hypertension) 08/01/2011   Chest pain 07/22/2011   Renal insufficiency 07/22/2011   Tobacco abuse 07/22/2011   Hypertension    Rayetta Humphrey, PT CLT [phone] 04/24/2021, 5:20 PM  [facility] [address] [place], [place], [address] Phone: [phone]   Fax:  [phone]  Name: Craig Knight MRN: [id number] Date of Birth: [birth year]

## 2021-04-25 ENCOUNTER — Ambulatory Visit (HOSPITAL_COMMUNITY): Payer: Medicare Other | Attending: Physical Medicine & Rehabilitation

## 2021-04-25 ENCOUNTER — Encounter (HOSPITAL_COMMUNITY): Payer: Self-pay

## 2021-04-25 DIAGNOSIS — N1831 Chronic kidney disease, stage 3a: Secondary | ICD-10-CM | POA: Diagnosis not present

## 2021-04-25 DIAGNOSIS — M79601 Pain in right arm: Secondary | ICD-10-CM | POA: Insufficient documentation

## 2021-04-25 DIAGNOSIS — R278 Other lack of coordination: Secondary | ICD-10-CM | POA: Insufficient documentation

## 2021-04-25 DIAGNOSIS — G8190 Hemiplegia, unspecified affecting unspecified side: Secondary | ICD-10-CM | POA: Diagnosis not present

## 2021-04-25 DIAGNOSIS — I5032 Chronic diastolic (congestive) heart failure: Secondary | ICD-10-CM | POA: Diagnosis not present

## 2021-04-25 DIAGNOSIS — R262 Difficulty in walking, not elsewhere classified: Secondary | ICD-10-CM | POA: Diagnosis not present

## 2021-04-25 DIAGNOSIS — R29818 Other symptoms and signs involving the nervous system: Secondary | ICD-10-CM | POA: Insufficient documentation

## 2021-04-25 DIAGNOSIS — M6281 Muscle weakness (generalized): Secondary | ICD-10-CM | POA: Insufficient documentation

## 2021-04-25 DIAGNOSIS — E119 Type 2 diabetes mellitus without complications: Secondary | ICD-10-CM | POA: Diagnosis not present

## 2021-04-25 NOTE — Patient Instructions (Signed)
1) SHOULDER: Flexion On Table ? ? ?Place hands on towel placed on table, elbows straight. Lean forward with you upper body, pushing towel away from body.  _10-15_ reps per set, __2-3 times per day ? ?SHOULDER PROM - INTERNAL - EXTERNAL ROTATION ? ?The subject should be fully relaxed while the caregiver does all the work (passive range of motion) to move the subject's arm. ? ?Hold the patient's elbow off the table and then rotate the shoulder pivoting through the upper arm as shown and repeat.   5-10 times. ? ? ? ?SHOULDER PROM - FLEXION - EXTENSION ? ?The subject should be fully relaxed while the caregiver does all the work (passive range of motion) to move the subject's arm. ? ?Grasp the subject's arm and raise it forward and up towards over the patient's head if range permits. Then, lower back down and repeat.    ?Complete 5-10 times. ? ? ?SHOULDER PROM - ADDUCTION - ADDUCTION ? ?The subject should be fully relaxed while the caregiver does all the work (passive range of motion) to move the subject's arm. ? ?Grasp the subject's arm and move it away from the patient's side and towards the head if range permits. Then, return to original position and repeat.   Complete 5-10 ? ? ? ? ?

## 2021-04-25 NOTE — Therapy (Addendum)
[facility] [address] [place], [place], [address] Phone: [phone]   Fax:  [phone]  Occupational Therapy Treatment  Patient Details  Name: Craig Knight MRN: [id number] Date of Birth: 1966/01/09 Referring Provider (OT): Claudette Laws, MD   Encounter Date: 04/25/2021   OT End of Session - 04/25/21 1533     Visit Number 2    Number of Visits 8    Date for OT Re-Evaluation 05/10/21    Authorization Type UHC Medicare    Authorization Time Period copay $20, no visit limit, authorization needed.    Progress Note Due on Visit 10    OT Start Time 1433    OT Stop Time 1514    OT Time Calculation (min) 41 min    Activity Tolerance Patient tolerated treatment well    Behavior During Therapy WFL for tasks assessed/performed             Past Medical History:  Diagnosis Date   Asthma    Bronchitis    COPD (chronic obstructive pulmonary disease) (HCC)    Diabetes mellitus without complication (HCC)    Heart murmur 08/24/2020   soft systolic murmur 1/6   Hypertension    Sleep apnea    Stroke [facility])     Past Surgical History:  Procedure Laterality Date   BUBBLE STUDY  09/18/2020   Procedure: BUBBLE STUDY;  Surgeon: Chrystie Nose, MD;  Location: [facility] ENDOSCOPY;  Service: Cardiovascular;;   COLONOSCOPY N/A 09/18/2016   Procedure: COLONOSCOPY;  Surgeon: West Bali, MD;  [facility] ENDO SU[facility];  Service: Endoscopy;  Laterality: N/A;  10:30 AM   lipoma removal     TEE WITHOUT CARDIOVERSION N/A 09/18/2020   Procedure: TRANSESOPHAGEAL ECHOCARDIOGRAM (TEE);  Surgeon: Chrystie Nose, MD;  Location: [facility] ENDOSCOPY;  Service: Cardiovascular;  Laterality: N/A;    There were no vitals filed for this visit.   Subjective Assessment - 04/25/21 1456     Subjective  S: I'm still tired.    Patient is accompanied by: Family member   Wife   Currently in Pain? No/denies                          OT  Treatments/Exercises (OP) - 04/25/21 1501       Exercises   Exercises Shoulder;Elbow      Shoulder Exercises: Supine   Protraction PROM;5 reps;AAROM;10 reps   OT assisted minimally during AA/ROM for arm weight support.   Horizontal ABduction PROM;5 reps    External Rotation PROM;5 reps    Internal Rotation PROM;5 reps    Flexion PROM;5 reps   only to shoulder level   ABduction PROM;5 reps   only to shoulder level     Shoulder Exercises: Seated   Other Seated Exercises table slides; flexion 15X      Elbow Exercises   Other elbow exercises Supine, P/ROM elbow flexion, extension 10X      Manual Therapy   Manual Therapy Myofascial release    Manual therapy comments Manual therapy completed prior to exercises.    Myofascial Release Myofascial release and manual stretching completed to right upper arm, upper trapezius, and scapularis region to decrease fascial restrictions and increase joint ROM.                    OT Education - 04/25/21 1530     Education Details Continue with self passive ROM while seated in  chair. Do not raise arm up past shoulder during flexion or abduction due to subluxation. Added table slide flexion. Passive ROM (wife assisting at home) supine IR/er, abduction. Due to noticeable elbow swelling; which is new wear an elbow compression sleeve on the right elbow (pt reports he has one already at home.)    Person(s) Educated Patient;Spouse    Methods Explanation;Demonstration    Comprehension Verbalized understanding              OT Short Term Goals - 04/25/21 1534       OT SHORT TERM GOAL #1   Title Pt and caregiver will be educated on HEP to improve functional mobility of the RUE and allow for decreased difficulty during ADL completion.    Time 4    Period Weeks    Status On-going    Target Date 05/10/21      OT SHORT TERM GOAL #2   Title Patient will increase shoulder, elbow, and wrist P/ROM by 10 degrees or more where needed in order to  decrease difficulty when participating in bathing and dressing tasks.    Time 4    Period Weeks    Status On-going                    Clinical Impression Statement: Completed manual techniques to RUE to address fascial restrictions prior to passive stretching. P/ROM for flexion and abduction completed to shoulder level due to subluxation and pain verbalized if attempting further range. Educated patient and wife throughout session regarding HEP, exercises, and use of elbow compression sleeve. VC for form and technique provided during session.   Plan - 04/25/21 1534     Body Structure / Function / Physical Skills Pain;ROM    Plan P: Follow up on HEP. If Wife Misty Stanley) has not had the chance to complete the passive ROM HEP at home then it may help to have her do it with you for some hands on practice. Complete passive stretching to wrist and forearm also.             Patient will benefit from skilled therapeutic intervention in order to improve the following deficits and impairments:   Body Structure / Function / Physical Skills: Pain, ROM       Visit Diagnosis: Other symptoms and signs involving the nervous system  Pain in right arm    Problem List Patient Active Problem List   Diagnosis Date Noted   Dysphagia, post-stroke    Essential hypertension    Diabetic peripheral neuropathy (HCC)    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   Acute CVA (cerebrovascular accident) (HCC) 09/06/2020   Stroke (HCC) 11/23/2018   Numbness 11/23/2018   CVA (cerebral vascular accident) (HCC) 11/23/2018   Lung nodule 11/17/2017   Chest pressure 11/22/2016   Shortness of breath 11/22/2016   Depression, major, single episode, moderate (HCC) 10/14/2016   Special screening for malignant neoplasms, colon    Chronic obstructive pulmonary disease (HCC) 06/08/2016   Hypertensive emergency 02/16/2016   Hypertensive urgency 06/07/2015   CKD (chronic kidney disease), stage III  (HCC) 06/07/2015   Pain in the chest    Elevated troponin    Chronic diastolic CHF (congestive heart failure) (HCC) 09/17/2013   DM type 2 (diabetes mellitus, type 2) (HCC) 09/15/2013   Chest pain 08/01/2011   HTN (hypertension) 08/01/2011   Chest pain 07/22/2011   Renal insufficiency 07/22/2011   Tobacco abuse 07/22/2011   Hypertension  Limmie Patricia, OTR/L,CBIS  [phone]  04/25/2021, 3:39 PM   [name] [address] [place], [place], [address] Phone: [phone]   Fax:  [phone]  Name: WACLAW PENE MRN: [id number] Date of Birth: 1965/09/29

## 2021-04-26 ENCOUNTER — Other Ambulatory Visit: Payer: Self-pay

## 2021-04-26 ENCOUNTER — Ambulatory Visit (HOSPITAL_COMMUNITY): Payer: Medicare Other

## 2021-04-26 ENCOUNTER — Encounter (HOSPITAL_COMMUNITY): Payer: Self-pay

## 2021-04-26 DIAGNOSIS — R262 Difficulty in walking, not elsewhere classified: Secondary | ICD-10-CM

## 2021-04-26 DIAGNOSIS — R29818 Other symptoms and signs involving the nervous system: Secondary | ICD-10-CM | POA: Diagnosis not present

## 2021-04-26 DIAGNOSIS — M6281 Muscle weakness (generalized): Secondary | ICD-10-CM | POA: Diagnosis not present

## 2021-04-26 DIAGNOSIS — R278 Other lack of coordination: Secondary | ICD-10-CM | POA: Diagnosis not present

## 2021-04-26 DIAGNOSIS — M79601 Pain in right arm: Secondary | ICD-10-CM | POA: Diagnosis not present

## 2021-04-26 NOTE — Therapy (Signed)
[place] [facility] ?[address] ?[place], [place], [address] ?Phone: [phone]   Fax:  [phone] ? ?Physical Therapy Treatment ? ?Patient Details  ?Name: Craig Knight ?MRN: [id number] ?Date of Birth: Apr 17, 1965 ?Referring Provider (PT): Alysia Penna MD ? ? ?Encounter Date: 04/26/2021 ? ? PT End of Session - 04/26/21 1116   ? ? Visit Number 5   ? Number of Visits 16   ? Date for PT Re-Evaluation 06/01/21   ? Authorization Type UHC Medicare   ? Progress Note Due on Visit 10   ? PT Start Time 1051   ? PT Stop Time 1135   ? PT Time Calculation (min) 44 min   ? Equipment Utilized During Treatment Gait belt   ? Activity Tolerance Patient limited by fatigue;Patient tolerated treatment well   ? Behavior During Therapy [facility] for tasks assessed/performed   ? ?  ?  ? ?  ? ? ?Past Medical History:  ?Diagnosis Date  ? Asthma   ? Bronchitis   ? COPD (chronic obstructive pulmonary disease) ([place])   ? Diabetes mellitus without complication ([place])   ? Heart murmur 08/24/2020  ? soft systolic murmur 1/6  ? Hypertension   ? Sleep apnea   ? Stroke [facility])   ? ? ?Past Surgical History:  ?Procedure Laterality Date  ? BUBBLE STUDY  09/18/2020  ? Procedure: BUBBLE STUDY;  Surgeon: Pixie Casino, MD;  Location: [facility] ENDOSCOPY;  Service: Cardiovascular;;  ? COLONOSCOPY N/A 09/18/2016  ? Procedure: COLONOSCOPY;  Surgeon: Danie Binder, MD;  [facility] ENDO [facility];  Service: Endoscopy;  Laterality: N/A;  10:30 AM  ? lipoma removal    ? TEE WITHOUT CARDIOVERSION N/A 09/18/2020  ? Procedure: TRANSESOPHAGEAL ECHOCARDIOGRAM (TEE);  Surgeon: Pixie Casino, MD;  Location: [place];  Service: Cardiovascular;  Laterality: N/A;  ? ? ?There were no vitals filed for this visit. ? ? Subjective Assessment - 04/26/21 1115   ? ? Subjective Pt stated he is tired, got 3 hrs of sleep last night.   ? Patient is accompained by: Family member   ? Pertinent History CVA   ? Patient Stated Goals to walk   ? Currently in  Pain? No/denies   ? ?  ?  ? ?  ? ? ? ? ? ? ? ? ? ? ? ? ? ? ? ? ? ? ? ? [facility] Adult PT Treatment/Exercise - 04/26/21 0001   ? ?  ? Bed Mobility  ? Rolling Right Supervision/verbal cueing   ? Rolling Left Supervision/Verbal cueing   ?  ? Transfers  ? Transfers Sit to Stand   ? Sit to Stand 5: Supervision   Lt on 2in step for increased Rt WB, cueing for posture and stand 10" prior eccentric control sitting  ?  ? Knee/Hip Exercises: Stretches  ? Other Knee/Hip Stretches PROM for ankle dorsiflexion hold 10" x 5   ?  ? Knee/Hip Exercises: Seated  ? Long [employer] Right;15 reps   ? Long [employer] Limitations with vibration gun   ?  ? Knee/Hip Exercises: Supine  ? Bridges 10 reps   ? Bridges with [employer] 10 reps   ? Single Leg Bridge Right;5 reps   ?  ? Knee/Hip Exercises: Sidelying  ? Hip ABduction Right;5 reps   ? Other Sidelying Knee/Hip Exercises knee flexion extenson.   ?  ? Knee/Hip Exercises: Prone  ? Hamstring Curl 10 reps   ? Hamstring Curl Limitations  with vibration gun   ? Other Prone Exercises attempt to come to tripod unsuccessful   ? Other Prone Exercises glut sets; heel squeezes   ? ?  ?  ? ?  ? ? ? ? ? ? ? ? ? ? ? ? PT Short Term Goals - 04/18/21 1132   ? ?  ? PT SHORT TERM GOAL #1  ? Title Patient to be I in all bed mobilities   ? Time 3   ? Period Weeks   ? Status On-going   ? Target Date 04/27/21   ?  ? PT SHORT TERM GOAL #2  ? Title Pt mm strength to increase 1/2 grade in Rt LE to allow pt to come sit to full standing position and stand for two minutes I witout UE assist .   ? Time 3   ? Period Weeks   ? Status On-going   ?  ? PT SHORT TERM GOAL #3  ? Title patient to ambulate 14ft with LRAD and CGA   ? Time 3   ? Period Weeks   ? Status On-going   ? ?  ?  ? ?  ? ? ? ? PT Long Term Goals - 04/18/21 1133   ? ?  ? PT LONG TERM GOAL #1  ? Title Patient will be able to ambualte 100 feet using hemiwalker   ? Time 6   ? Period Weeks   ? Status On-going   ?  ? PT LONG TERM GOAL #2  ? Title Pt mm strength will  be increased one grade to allow pt to be able to step up 2 steps with a hand rail and Contace guard assist   ? Time 8   ? Period Weeks   ? Status On-going   ? ?  ?  ? ?  ? ? ? ? ? ? ? ? Plan - 04/26/21 1303   ? ? Clinical Impression Statement Attempted tripod position intially this session, pt core and proximal mm too weak.  Attempted next session with rolled wedge to assist.  Improved activtion noted wiht hamstring and quadriceps with vibration gun.  Pt limited by fatigue at EOS.   ? Personal Factors and Comorbidities Time since onset of injury/illness/exacerbation;Fitness   ? Comorbidities multiple CVAs   ? Examination-Activity Limitations Transfers;Locomotion Level;Bed Mobility;Squat;Stairs;Stand   ? Examination-Participation Restrictions Community Activity;Driving;Cleaning   ? Stability/Clinical Decision Making Stable/Uncomplicated   ? Clinical Decision Making Low   ? Rehab Potential Fair   ? PT Frequency 2x / week   ? PT Duration 8 weeks   ? PT Treatment/Interventions ADLs/Self Care Home Management;Aquatic Therapy;DME Instruction;Gait training;Stair training;Functional mobility training;Therapeutic activities;Therapeutic exercise;Balance training;Neuromuscular re-education;Patient/family education;Orthotic Fit/Training;Passive range of motion;Wheelchair mobility training;Visual/perceptual remediation/compensation;Energy conservation;Spinal Manipulations;Splinting;Joint Manipulations;Taping;Vasopneumatic Device;Compression bandaging;Manual techniques;Dry needling;Cognitive remediation;Electrical Stimulation;Cryotherapy;Biofeedback;Moist Heat;Traction;Iontophoresis 4mg /ml Dexamethasone   ? PT Next Visit Plan Attempt roll wedge wiht tripod first thing before pt fatigues.  Continue with I in  bed mobility, Mat exercises for strengthening, sit to stand standing balance.  Progress to gait with hemiwalkerl   ? PT Home Exercise Plan Eval: LAQ with tapping to excite mm,sitting hip adduction isometrically, attempt heel  raise, toe raise;  Supine:  PROM for RT dorsiflexion, heel slide, hip ab/adduction, SLR and bridge.   ? ?  ?  ? ?  ? ? ?Patient will benefit from skilled therapeutic intervention in order to improve the following deficits and impairments:  Abnormal gait, Difficulty walking, Decreased endurance, Decreased activity tolerance, Decreased balance,  Decreased mobility, Decreased strength, Postural dysfunction ? ?Visit Diagnosis: ?Muscle weakness (generalized) ? ?Difficulty in walking, not elsewhere classified ? ? ? ? ?Problem List ?Patient Active Problem List  ? Diagnosis Date Noted  ? Dysphagia, post-stroke   ? Essential hypertension   ? Diabetic peripheral neuropathy ([place])   ? Infarction of left basal ganglia ([place]) 09/19/2020  ? Acute CVA (cerebrovascular accident) ([place]) 09/06/2020  ? Stroke [facility]) 11/23/2018  ? Numbness 11/23/2018  ? CVA (cerebral vascular accident) ([place]) 11/23/2018  ? Lung nodule 11/17/2017  ? Chest pressure 11/22/2016  ? Shortness of breath 11/22/2016  ? Depression, major, single episode, moderate ([place]) 10/14/2016  ? Special screening for malignant neoplasms, colon   ? Chronic obstructive pulmonary disease ([place]) 06/08/2016  ? Hypertensive emergency 02/16/2016  ? Hypertensive urgency 06/07/2015  ? CKD (chronic kidney disease), stage III ([place]) 06/07/2015  ? Pain in the chest   ? Elevated troponin   ? Chronic diastolic CHF (congestive heart failure) ([place]) 09/17/2013  ? DM type 2 (diabetes mellitus, type 2) ([place]) 09/15/2013  ? Chest pain 08/01/2011  ? HTN (hypertension) 08/01/2011  ? Chest pain 07/22/2011  ? Renal insufficiency 07/22/2011  ? Tobacco abuse 07/22/2011  ? Hypertension   ? ?Ihor Austin, LPTA/CLT; CBIS ?[phone] ? ?Aldona Lento, PTA ?04/26/2021, 1:07 PM ? ?[place] ?[place] ?[address] ?[place], [place], [address] ?Phone: [phone]   Fax:  [phone] ? ?Name: Craig Knight ?MRN: [id number] ?Date of Birth: June 12, 1965 ? ? ? ?

## 2021-04-27 ENCOUNTER — Ambulatory Visit (HOSPITAL_COMMUNITY): Payer: Medicare Other | Admitting: Occupational Therapy

## 2021-04-27 ENCOUNTER — Encounter (HOSPITAL_COMMUNITY): Payer: Self-pay | Admitting: Occupational Therapy

## 2021-04-27 DIAGNOSIS — R29818 Other symptoms and signs involving the nervous system: Secondary | ICD-10-CM

## 2021-04-27 DIAGNOSIS — M79601 Pain in right arm: Secondary | ICD-10-CM

## 2021-04-27 DIAGNOSIS — R262 Difficulty in walking, not elsewhere classified: Secondary | ICD-10-CM | POA: Diagnosis not present

## 2021-04-27 DIAGNOSIS — R278 Other lack of coordination: Secondary | ICD-10-CM | POA: Diagnosis not present

## 2021-04-27 DIAGNOSIS — M6281 Muscle weakness (generalized): Secondary | ICD-10-CM | POA: Diagnosis not present

## 2021-04-27 NOTE — Therapy (Signed)
[facility] [facility] ?[address] ?[place], [place], [address] ?Phone: [phone]   Fax:  [phone] ? ?Occupational Therapy Treatment ? ?Patient Details  ?Name: Craig Knight ?MRN: [id number] ?Date of Birth: 1965/12/16 ?Referring Provider (OT): Alysia Penna, MD ? ? ?Encounter Date: 04/27/2021 ? ? OT End of Session - 04/27/21 1512   ? ? Visit Number 3   ? Number of Visits 8   ? Date for OT Re-Evaluation 05/10/21   ? Authorization Type UHC Medicare   ? Authorization Time Period copay $20, no visit limit, authorization needed.   ? Progress Note Due on Visit 10   ? OT Start Time 1420   ? OT Stop Time 9937   ? OT Time Calculation (min) 44 min   ? Activity Tolerance Patient tolerated treatment well   ? Behavior During Therapy [facility] for tasks assessed/performed   ? ?  ?  ? ?  ? ? ?Past Medical History:  ?Diagnosis Date  ? Asthma   ? Bronchitis   ? COPD (chronic obstructive pulmonary disease) ([place])   ? Diabetes mellitus without complication ([place])   ? Heart murmur 08/24/2020  ? soft systolic murmur 1/6  ? Hypertension   ? Sleep apnea   ? Stroke [facility])   ? ? ?Past Surgical History:  ?Procedure Laterality Date  ? BUBBLE STUDY  09/18/2020  ? Procedure: BUBBLE STUDY;  Surgeon: Pixie Casino, MD;  Location: [facility] ENDOSCOPY;  Service: Cardiovascular;;  ? COLONOSCOPY N/A 09/18/2016  ? Procedure: COLONOSCOPY;  Surgeon: Danie Binder, MD;  [facility] ENDO [facility];  Service: Endoscopy;  Laterality: N/A;  10:30 AM  ? lipoma removal    ? TEE WITHOUT CARDIOVERSION N/A 09/18/2020  ? Procedure: TRANSESOPHAGEAL ECHOCARDIOGRAM (TEE);  Surgeon: Pixie Casino, MD;  Location: [place];  Service: Cardiovascular;  Laterality: N/A;  ? ? ?There were no vitals filed for this visit. ? ? Subjective Assessment - 04/27/21 1419   ? ? Subjective  S: They were good. (exercises at home)   ? Currently in Pain? No/denies   ? ?  ?  ? ?  ? ? ? ? ? [facility] OT Assessment - 04/27/21 1417   ? ?  ? Assessment  ? Medical  Diagnosis right shoulder stiffness   ?  ? Precautions  ? Precautions Other (comment)   ? Precaution Comments right side hemiplegia   ? ?  ?  ? ?  ? ? ? ? ? ? ? ? ? ? ? OT Treatments/Exercises (OP) - 04/27/21 1427   ? ?  ? Transfers  ? Sit to Stand 5: Supervision   ?  ? Exercises  ? Exercises Shoulder;Elbow;Wrist   ?  ? Shoulder Exercises: Supine  ? Protraction PROM;10 reps   ? Horizontal ABduction PROM;10 reps   ? External Rotation PROM;10 reps   ? Internal Rotation PROM;10 reps   ? Flexion PROM;10 reps   to shoulder level  ? ABduction PROM;10 reps   to shoulder level  ?  ? Shoulder Exercises: Seated  ? Other Seated Exercises table slides; flexion 10X   ?  ? Elbow Exercises  ? Forearm Supination PROM;10 reps   ? Forearm Pronation PROM;10 reps   ? Other elbow exercises Supine, P/ROM elbow flexion, extension 10X   ?  ? Wrist Exercises  ? Wrist Flexion PROM;10 reps   ? Wrist Extension PROM;10 reps   ?  ? Manual Therapy  ? Manual Therapy Myofascial release;Other (comment)   ?  Manual therapy comments Manual therapy completed prior to exercises.   ? Myofascial Release Myofascial release and manual stretching completed to right upper arm, upper trapezius, and scapularis region to decrease fascial restrictions and increase joint ROM.   ? Other Manual Therapy Massage gun utlilized to target larger areas of fascial restrictions and muscle tightness, level 5 at upper arm and trapezius regions   ? ?  ?  ? ?  ? ? ? ? ? ? ? ? ? ? ? OT Short Term Goals - 04/25/21 1534   ? ?  ? OT SHORT TERM GOAL #1  ? Title Pt and caregiver will be educated on HEP to improve functional mobility of the RUE and allow for decreased difficulty during ADL completion.   ? Time 4   ? Period Weeks   ? Status On-going   ? Target Date 05/10/21   ?  ? OT SHORT TERM GOAL #2  ? Title Patient will increase shoulder, elbow, and wrist P/ROM by 10 degrees or more where needed in order to decrease difficulty when participating in bathing and dressing tasks.   ?  Time 4   ? Period Weeks   ? Status On-going   ? ?  ?  ? ?  ? ? ? ? OT Long Term Goals - 11/22/20 1033   ? ?  ? OT LONG TERM GOAL #1  ? Title -   ? ?  ?  ? ?  ? ? ? ? ? ? ? ? Plan - 04/27/21 1512   ? ? Clinical Impression Statement A: Pt and wife report they are completing HEP and it is going well. Continued with myofascial release to address fascial restrictions and tone in RUE today, added massage gun work for tighter areas. Continued with P/ROM to RUE, only to shoulder level due to subluxation. Continued with table slides in flexion. Discussed self-ROM that pt can be incorporating at home if he would like to.   ? Body Structure / Function / Physical Skills Pain;ROM   ? Plan P: P/ROM throughout RUE, review self-ROM techniques and add to HEP   ? OT Home Exercise Plan eval: continue with current HEP.   ? Consulted and Agree with Plan of Care Patient;Family member/caregiver   ? Family Member Consulted Wife, [name]   ? ?  ?  ? ?  ? ? ?Patient will benefit from skilled therapeutic intervention in order to improve the following deficits and impairments:   ?Body Structure / Function / Physical Skills: Pain, ROM ?  ?  ? ? ?Visit Diagnosis: ?Other symptoms and signs involving the nervous system ? ?Pain in right arm ? ? ? ?Problem List ?Patient Active Problem List  ? Diagnosis Date Noted  ? Dysphagia, post-stroke   ? Essential hypertension   ? Diabetic peripheral neuropathy ([place])   ? Infarction of left basal ganglia ([place]) 09/19/2020  ? Acute CVA (cerebrovascular accident) ([place]) 09/06/2020  ? Stroke [facility]) 11/23/2018  ? Numbness 11/23/2018  ? CVA (cerebral vascular accident) ([place]) 11/23/2018  ? Lung nodule 11/17/2017  ? Chest pressure 11/22/2016  ? Shortness of breath 11/22/2016  ? Depression, major, single episode, moderate ([place]) 10/14/2016  ? Special screening for malignant neoplasms, colon   ? Chronic obstructive pulmonary disease ([place]) 06/08/2016  ? Hypertensive emergency 02/16/2016  ? Hypertensive urgency 06/07/2015  ? CKD  (chronic kidney disease), stage III ([place]) 06/07/2015  ? Pain in the chest   ? Elevated troponin   ? Chronic diastolic CHF (  congestive heart failure) ([place]) 09/17/2013  ? DM type 2 (diabetes mellitus, type 2) ([place]) 09/15/2013  ? Chest pain 08/01/2011  ? HTN (hypertension) 08/01/2011  ? Chest pain 07/22/2011  ? Renal insufficiency 07/22/2011  ? Tobacco abuse 07/22/2011  ? Hypertension   ? ? ?Guadelupe Sabin, OTR/L  [phone] ?04/27/2021, 3:15 PM ? ?[place] ?[place] ?[address] ?[place], [place], [address] ?Phone: [phone]   Fax:  [phone] ? ?Name: Craig Knight ?MRN: [id number] ?Date of Birth: [birth year] ? ?

## 2021-05-01 ENCOUNTER — Ambulatory Visit (HOSPITAL_COMMUNITY): Payer: Medicare Other

## 2021-05-01 ENCOUNTER — Encounter (HOSPITAL_COMMUNITY): Payer: Self-pay

## 2021-05-01 ENCOUNTER — Other Ambulatory Visit: Payer: Self-pay

## 2021-05-01 DIAGNOSIS — Z1389 Encounter for screening for other disorder: Secondary | ICD-10-CM | POA: Diagnosis not present

## 2021-05-01 DIAGNOSIS — R262 Difficulty in walking, not elsewhere classified: Secondary | ICD-10-CM

## 2021-05-01 DIAGNOSIS — E118 Type 2 diabetes mellitus with unspecified complications: Secondary | ICD-10-CM | POA: Diagnosis not present

## 2021-05-01 DIAGNOSIS — R29818 Other symptoms and signs involving the nervous system: Secondary | ICD-10-CM | POA: Diagnosis not present

## 2021-05-01 DIAGNOSIS — M6281 Muscle weakness (generalized): Secondary | ICD-10-CM

## 2021-05-01 DIAGNOSIS — E119 Type 2 diabetes mellitus without complications: Secondary | ICD-10-CM | POA: Diagnosis not present

## 2021-05-01 DIAGNOSIS — I5032 Chronic diastolic (congestive) heart failure: Secondary | ICD-10-CM | POA: Diagnosis not present

## 2021-05-01 DIAGNOSIS — Z0001 Encounter for general adult medical examination with abnormal findings: Secondary | ICD-10-CM | POA: Diagnosis not present

## 2021-05-01 DIAGNOSIS — M79601 Pain in right arm: Secondary | ICD-10-CM | POA: Diagnosis not present

## 2021-05-01 DIAGNOSIS — R278 Other lack of coordination: Secondary | ICD-10-CM | POA: Diagnosis not present

## 2021-05-01 DIAGNOSIS — I10 Essential (primary) hypertension: Secondary | ICD-10-CM | POA: Diagnosis not present

## 2021-05-01 DIAGNOSIS — N1831 Chronic kidney disease, stage 3a: Secondary | ICD-10-CM | POA: Diagnosis not present

## 2021-05-01 NOTE — Therapy (Signed)
[place] ?[place] ?[address] ?[place], [place], [address] ?Phone: [phone]   Fax:  [phone] ? ?Physical Therapy Treatment ? ?Patient Details  ?Name: Craig Knight ?MRN: [id number] ?Date of Birth: Jul 23, 1965 ?Referring Provider (PT): Alysia Penna MD ? ? ?Encounter Date: 05/01/2021 ? ? PT End of Session - 05/01/21 1722   ? ? Visit Number 6   ? Number of Visits 16   ? Date for PT Re-Evaluation 06/01/21   ? Authorization Type UHC Medicare   ? Progress Note Due on Visit 10   ? PT Start Time 1400   ? PT Stop Time 1441   ? PT Time Calculation (min) 41 min   ? Equipment Utilized During Treatment Gait belt   ? Activity Tolerance Patient limited by fatigue;Patient tolerated treatment well   ? Behavior During Therapy [facility] for tasks assessed/performed   ? ?  ?  ? ?  ? ? ?Past Medical History:  ?Diagnosis Date  ? Asthma   ? Bronchitis   ? COPD (chronic obstructive pulmonary disease) ([place])   ? Diabetes mellitus without complication ([place])   ? Heart murmur 08/24/2020  ? soft systolic murmur 1/6  ? Hypertension   ? Sleep apnea   ? Stroke [facility])   ? ? ?Past Surgical History:  ?Procedure Laterality Date  ? BUBBLE STUDY  09/18/2020  ? Procedure: BUBBLE STUDY;  Surgeon: Pixie Casino, MD;  Location: [facility] ENDOSCOPY;  Service: Cardiovascular;;  ? COLONOSCOPY N/A 09/18/2016  ? Procedure: COLONOSCOPY;  Surgeon: Danie Binder, MD;  [facility];  Service: Endoscopy;  Laterality: N/A;  10:30 AM  ? lipoma removal    ? TEE WITHOUT CARDIOVERSION N/A 09/18/2020  ? Procedure: TRANSESOPHAGEAL ECHOCARDIOGRAM (TEE);  Surgeon: Pixie Casino, MD;  Location: [place];  Service: Cardiovascular;  Laterality: N/A;  ? ? ?There were no vitals filed for this visit. ? ? Subjective Assessment - 05/01/21 1720   ? ? Subjective Reports he is tired and sore from exercises.   ? Patient is accompained by: Family member   wife  ? Pertinent History CVA   ? Patient Stated Goals to walk   ? Currently in  Pain? No/denies   ? ?  ?  ? ?  ? ? ? ? ? ? ? ? ? ? ? ? ? ? ? ? ? ? ? ? [name] Adult PT Treatment/Exercise - 05/01/21 0001   ? ?  ? Bed Mobility  ? Rolling Right Supervision/verbal cueing   ? Rolling Left Contact Guard/Touching assist;Minimal Assistance - Patient > 75%   ?  ? Transfers  ? Sit to Stand 5: Supervision   ?  ? Knee/Hip Exercises: Stretches  ? Other Knee/Hip Stretches POE   ?  ? Knee/Hip Exercises: Prone  ? Hamstring Curl 10 reps   ? Hamstring Curl Limitations with vibration gun   ? Other Prone Exercises quadruped on green ball with 2therapist assistance; rocking forward/backl; Rt/Lt   ? Other Prone Exercises tall kneeling with greenball support 4 set, ~71mn hold +-   ? ?  ?  ? ?  ? ? ? ? ? ? ? ? ? ? ? ? PT Short Term Goals - 04/18/21 1132   ? ?  ? PT SHORT TERM GOAL #1  ? Title Patient to be I in all bed mobilities   ? Time 3   ? Period Weeks   ? Status On-going   ? Target Date 04/27/21   ?  ?  PT SHORT TERM GOAL #2  ? Title Pt mm strength to increase 1/2 grade in Rt LE to allow pt to come sit to full standing position and stand for two minutes I witout UE assist .   ? Time 3   ? Period Weeks   ? Status On-going   ?  ? PT SHORT TERM GOAL #3  ? Title patient to ambulate 13f with LRAD and CGA   ? Time 3   ? Period Weeks   ? Status On-going   ? ?  ?  ? ?  ? ? ? ? PT Long Term Goals - 04/18/21 1133   ? ?  ? PT LONG TERM GOAL #1  ? Title Patient will be able to ambualte 100 feet using hemiwalker   ? Time 6   ? Period Weeks   ? Status On-going   ?  ? PT LONG TERM GOAL #2  ? Title Pt mm strength will be increased one grade to allow pt to be able to step up 2 steps with a hand rail and Contace guard assist   ? Time 8   ? Period Weeks   ? Status On-going   ? ?  ?  ? ?  ? ? ? ? ? ? ? ? Plan - 05/01/21 1723   ? ? Clinical Impression Statement Able to acheive tripod position with 2 therapists and use of theraball for support.  Pt required mod/max assistance to achieve tripod positoin with cueing for weight  distribution to support upper half.  Able to acheive tall kneeling wiht min guard following cueing for core and gluteal activation and able to hold for 45" close to a minute.  Pt limited by fatigue with activity.   ? Personal Factors and Comorbidities Time since onset of injury/illness/exacerbation;Fitness   ? Comorbidities multiple CVAs   ? Examination-Activity Limitations Transfers;Locomotion Level;Bed Mobility;Squat;Stairs;Stand   ? Examination-Participation Restrictions Community Activity;Driving;Cleaning   ? Stability/Clinical Decision Making Stable/Uncomplicated   ? Clinical Decision Making Low   ? Rehab Potential Fair   ? PT Frequency 2x / week   ? PT Duration 8 weeks   ? PT Treatment/Interventions ADLs/Self Care Home Management;Aquatic Therapy;DME Instruction;Gait training;Stair training;Functional mobility training;Therapeutic activities;Therapeutic exercise;Balance training;Neuromuscular re-education;Patient/family education;Orthotic Fit/Training;Passive range of motion;Wheelchair mobility training;Visual/perceptual remediation/compensation;Energy conservation;Spinal Manipulations;Splinting;Joint Manipulations;Taping;Vasopneumatic Device;Compression bandaging;Manual techniques;Dry needling;Cognitive remediation;Electrical Stimulation;Cryotherapy;Biofeedback;Moist Heat;Traction;Iontophoresis '4mg'$ /ml Dexamethasone   ? PT Next Visit Plan Begin with tripod first thing before pt fatigues with use of theraball assistance.  Continue with I in  bed mobility, Mat exercises for strengthening, sit to stand standing balance.  Progress to gait with hemiwalkerl   ? PT Home Exercise Plan Eval: LAQ with tapping to excite mm,sitting hip adduction isometrically, attempt heel raise, toe raise;  Supine:  PROM for RT dorsiflexion, heel slide, hip ab/adduction, SLR and bridge.   ? Consulted and Agree with Plan of Care Patient;Family member/caregiver   ? Family Member Consulted wife   ? ?  ?  ? ?  ? ? ?Patient will benefit from  skilled therapeutic intervention in order to improve the following deficits and impairments:  Abnormal gait, Difficulty walking, Decreased endurance, Decreased activity tolerance, Decreased balance, Decreased mobility, Decreased strength, Postural dysfunction ? ?Visit Diagnosis: ?Muscle weakness (generalized) ? ?Difficulty in walking, not elsewhere classified ? ? ? ? ?Problem List ?Patient Active Problem List  ? Diagnosis Date Noted  ? Dysphagia, post-stroke   ? Essential hypertension   ? Diabetic peripheral neuropathy ([place]   ? Infarction of  left basal ganglia ([place]) 09/19/2020  ? Acute CVA (cerebrovascular accident) ([place]) 09/06/2020  ? Stroke [facility]) 11/23/2018  ? Numbness 11/23/2018  ? CVA (cerebral vascular accident) ([place]) 11/23/2018  ? Lung nodule 11/17/2017  ? Chest pressure 11/22/2016  ? Shortness of breath 11/22/2016  ? Depression, major, single episode, moderate ([place]) 10/14/2016  ? Special screening for malignant neoplasms, colon   ? Chronic obstructive pulmonary disease ([place]) 06/08/2016  ? Hypertensive emergency 02/16/2016  ? Hypertensive urgency 06/07/2015  ? CKD (chronic kidney disease), stage III ([place]) 06/07/2015  ? Pain in the chest   ? Elevated troponin   ? Chronic diastolic CHF (congestive heart failure) ([place]) 09/17/2013  ? DM type 2 (diabetes mellitus, type 2) ([place]) 09/15/2013  ? Chest pain 08/01/2011  ? HTN (hypertension) 08/01/2011  ? Chest pain 07/22/2011  ? Renal insufficiency 07/22/2011  ? Tobacco abuse 07/22/2011  ? Hypertension   ? ?Ihor Austin, LPTA/CLT; CBIS ?[phone] ? ?Aldona Lento, PTA ?05/01/2021, 5:33 PM ? ?[place] ?[place] ?[address] ?[place], [place], [address] ?Phone: [phone]   Fax:  [phone] ? ?Name: EZEQUIEL MACAULEY ?MRN: [id number] ?Date of Birth: 06-28-1965 ? ? ? ?

## 2021-05-03 ENCOUNTER — Other Ambulatory Visit: Payer: Self-pay

## 2021-05-03 ENCOUNTER — Encounter (HOSPITAL_COMMUNITY): Payer: Self-pay

## 2021-05-03 ENCOUNTER — Ambulatory Visit (HOSPITAL_COMMUNITY): Payer: Medicare Other

## 2021-05-03 DIAGNOSIS — M79601 Pain in right arm: Secondary | ICD-10-CM | POA: Diagnosis not present

## 2021-05-03 DIAGNOSIS — R29818 Other symptoms and signs involving the nervous system: Secondary | ICD-10-CM | POA: Diagnosis not present

## 2021-05-03 DIAGNOSIS — M6281 Muscle weakness (generalized): Secondary | ICD-10-CM

## 2021-05-03 DIAGNOSIS — R262 Difficulty in walking, not elsewhere classified: Secondary | ICD-10-CM

## 2021-05-03 DIAGNOSIS — R278 Other lack of coordination: Secondary | ICD-10-CM | POA: Diagnosis not present

## 2021-05-03 NOTE — Therapy (Signed)
?OUTPATIENT PHYSICAL THERAPY TREATMENT NOTE ? ? ?Patient Name: Craig Knight ?MRN: [id number] ?DOB:1966/01/14, 56 y.o., male ?Today's Date: 05/03/2021 ? ?PCP: Carrolyn Meiers, MD ?REFERRING PROVIDER: Carrolyn Meiers* ? ? PT End of Session - 05/03/21 1356   ? ? Visit Number 7   ? Number of Visits 16   ? Date for PT Re-Evaluation 06/01/21   ? Authorization Type UHC Medicare   ? Progress Note Due on Visit 10   ? PT Start Time 1400   ? PT Stop Time 7829   ? PT Time Calculation (min) 42 min   ? Equipment Utilized During Treatment Gait belt   ? Activity Tolerance Patient limited by fatigue;Patient tolerated treatment well   ? Behavior During Therapy [facility] for tasks assessed/performed   ? ?  ?  ? ?  ? ? ?Past Medical History:  ?Diagnosis Date  ? Asthma   ? Bronchitis   ? COPD (chronic obstructive pulmonary disease) ([place])   ? Diabetes mellitus without complication ([place])   ? Heart murmur 08/24/2020  ? soft systolic murmur 1/6  ? Hypertension   ? Sleep apnea   ? Stroke [facility])   ? ?Past Surgical History:  ?Procedure Laterality Date  ? BUBBLE STUDY  09/18/2020  ? Procedure: BUBBLE STUDY;  Surgeon: Pixie Casino, MD;  Location: [facility] ENDOSCOPY;  Service: Cardiovascular;;  ? COLONOSCOPY N/A 09/18/2016  ? Procedure: COLONOSCOPY;  Surgeon: Danie Binder, MD;  [facility]O [facility];  Service: Endoscopy;  Laterality: N/A;  10:30 AM  ? lipoma removal    ? TEE WITHOUT CARDIOVERSION N/A 09/18/2020  ? Procedure: TRANSESOPHAGEAL ECHOCARDIOGRAM (TEE);  Surgeon: Pixie Casino, MD;  Location: [place];  Service: Cardiovascular;  Laterality: N/A;  ? ?Patient Active Problem List  ? Diagnosis Date Noted  ? Dysphagia, post-stroke   ? Essential hypertension   ? Diabetic peripheral neuropathy ([place])   ? Infarction of left basal ganglia ([place]) 09/19/2020  ? Acute CVA (cerebrovascular accident) ([place]) 09/06/2020  ? Stroke [facility]) 11/23/2018  ? Numbness 11/23/2018  ? CVA (cerebral vascular accident) ([place]) 11/23/2018  ? Lung nodule  11/17/2017  ? Chest pressure 11/22/2016  ? Shortness of breath 11/22/2016  ? Depression, major, single episode, moderate ([place]) 10/14/2016  ? Special screening for malignant neoplasms, colon   ? Chronic obstructive pulmonary disease ([place]) 06/08/2016  ? Hypertensive emergency 02/16/2016  ? Hypertensive urgency 06/07/2015  ? CKD (chronic kidney disease), stage III ([place]) 06/07/2015  ? Pain in the chest   ? Elevated troponin   ? Chronic diastolic CHF (congestive heart failure) ([place]) 09/17/2013  ? DM type 2 (diabetes mellitus, type 2) ([place]) 09/15/2013  ? Chest pain 08/01/2011  ? HTN (hypertension) 08/01/2011  ? Chest pain 07/22/2011  ? Renal insufficiency 07/22/2011  ? Tobacco abuse 07/22/2011  ? Hypertension   ? ? ?REFERRING DIAG: CVA  ? ?THERAPY DIAG:  ?Muscle weakness (generalized) ? ?Difficulty in walking, not elsewhere classified ? ?PERTINENT HISTORY: CVA  ? ?PRECAUTIONS: none ? ?SUBJECTIVE: PT stated he was sore following last session. ? ?PAIN:  ?Are you having pain? Yes ?NPRS scale: no number given/10 ?Pain location: Generalized soreness ? ? ? ?TODAY'S TREATMENT:  ? ? 05/03/21 0001  ?Bed Mobility  ?Rolling Right Supervision/verbal cueing  ?Rolling Left Contact Guard/Touching assist;Minimal Assistance - Patient > 75%  ?Transfers  ?Transfers Sit to Stand  ?Sit to Stand 5: Supervision  ?Stand Pivot Transfers 5: Supervision  ?Knee/Hip Exercises: Stretches  ?Other Knee/Hip Stretches POE  ?Knee/Hip  Exercises: Supine  ?Bridges 10 reps  ?Knee/Hip Exercises: Prone  ?Other Prone Exercises quadruped on green ball including weight shifting, transition to tall kneeling, push/pull ball standing tall  ?Other Prone Exercises crawling with green ball under, tall kneel walking forward/backward  ? ? ? ? ? ?HOME EXERCISE PROGRAM: ?Eval: LAQ with tapping to excite mm,sitting hip adduction isometrically, attempt heel raise, toe raise;  Supine:  PROM for RT dorsiflexion, heel slide, hip ab/adduction, SLR and bridge.  ? ? PT Short Term  Goals - 05/03/21 1357   ? ?  ? PT SHORT TERM GOAL #1  ? Title Patient to be I in all bed mobilities   ? Time 3   ? Period Weeks   ? Status On-going   ? Target Date 04/27/21   ?  ? PT SHORT TERM GOAL #2  ? Title Pt mm strength to increase 1/2 grade in Rt LE to allow pt to come sit to full standing position and stand for two minutes I witout UE assist .   ? Time 3   ? Period Weeks   ? Status On-going   ?  ? PT SHORT TERM GOAL #3  ? Title patient to ambulate 68f with LRAD and CGA   ? Time 3   ? Period Weeks   ? Status On-going   ? ?  ?  ? ?  ? ? ? PT Long Term Goals - 05/03/21 1357   ? ?  ? PT LONG TERM GOAL #1  ? Title Patient will be able to ambualte 100 feet using hemiwalker   ? Time 6   ? Period Weeks   ? Status On-going   ?  ? PT LONG TERM GOAL #2  ? Title Pt mm strength will be increased one grade to allow pt to be able to step up 2 steps with a hand rail and Contace guard assist   ? Time 8   ? Period Weeks   ? Status On-going   ? ?  ?  ? ?  ? ? ? Plan - 05/03/21 1357   ? ? Clinical Impression Statement Therapeutic activities complete in quadruped and tall kneeling wiht assistance of theraball and 2 therapists with mod assistance to transition from one position to the next.  Able to acheive quadruped position quicker this session and ability to stand tall kneeling for over 2 minutes prior fatigue.  Able to acheive crawling over theraball wiht some assistance for Rt LE as well as tall kneel walking.  Plan to continue theraball next session.   ? Personal Factors and Comorbidities Time since onset of injury/illness/exacerbation;Fitness   ? Comorbidities multiple CVAs   ? Examination-Activity Limitations Transfers;Locomotion Level;Bed Mobility;Squat;Stairs;Stand   ? Examination-Participation Restrictions Community Activity;Driving;Cleaning   ? Stability/Clinical Decision Making Stable/Uncomplicated   ? Rehab Potential Fair   ? PT Frequency 2x / week   ? PT Duration 8 weeks   ? PT Treatment/Interventions ADLs/Self  Care Home Management;Aquatic Therapy;DME Instruction;Gait training;Stair training;Functional mobility training;Therapeutic activities;Therapeutic exercise;Balance training;Neuromuscular re-education;Patient/family education;Orthotic Fit/Training;Passive range of motion;Wheelchair mobility training;Visual/perceptual remediation/compensation;Energy conservation;Spinal Manipulations;Splinting;Joint Manipulations;Taping;Vasopneumatic Device;Compression bandaging;Manual techniques;Dry needling;Cognitive remediation;Electrical Stimulation;Cryotherapy;Biofeedback;Moist Heat;Traction;Iontophoresis '4mg'$ /ml Dexamethasone   ? PT Next Visit Plan Continue with tripod first thing before pt fatigues with use of theraball assistance.  Continue with I in  bed mobility, Mat exercises for strengthening, sit to stand standing balance.  Progress to gait with hemiwalkerl   ? PT Home Exercise Plan Eval: LAQ with tapping to excite mm,sitting hip adduction isometrically,  attempt heel raise, toe raise;  Supine:  PROM for RT dorsiflexion, heel slide, hip ab/adduction, SLR and bridge.   ? Consulted and Agree with Plan of Care Patient;Family member/caregiver   ? Family Member Consulted wife   ? ?  ?  ? ?  ? ? ? ?Ihor Austin, LPTA/CLT; CBIS ?[phone] ? ?Aldona Lento, PTA ?05/03/2021, 7:33 PM ? ?  ? ?

## 2021-05-04 ENCOUNTER — Ambulatory Visit (HOSPITAL_COMMUNITY): Payer: Medicare Other | Admitting: Occupational Therapy

## 2021-05-04 ENCOUNTER — Encounter (HOSPITAL_COMMUNITY): Payer: Self-pay | Admitting: Occupational Therapy

## 2021-05-04 DIAGNOSIS — R278 Other lack of coordination: Secondary | ICD-10-CM | POA: Diagnosis not present

## 2021-05-04 DIAGNOSIS — M79601 Pain in right arm: Secondary | ICD-10-CM | POA: Diagnosis not present

## 2021-05-04 DIAGNOSIS — R29818 Other symptoms and signs involving the nervous system: Secondary | ICD-10-CM | POA: Diagnosis not present

## 2021-05-04 DIAGNOSIS — R262 Difficulty in walking, not elsewhere classified: Secondary | ICD-10-CM | POA: Diagnosis not present

## 2021-05-04 DIAGNOSIS — M6281 Muscle weakness (generalized): Secondary | ICD-10-CM | POA: Diagnosis not present

## 2021-05-04 NOTE — Therapy (Signed)
?OUTPATIENT OCCUPATIONAL THERAPY TREATMENT NOTE ? ? ?Patient Name: Craig Knight ?MRN: [id number] ?DOB:1965/05/24, 56 y.o., male ?Today's Date: 05/04/2021 ? ?PCP: Carrolyn Meiers, MD ?REFERRING PROVIDER: [place], [place] ? OT End of Session - 05/04/21 1515   ? ? Visit Number 4   ? Number of Visits 8   ? Date for OT Re-Evaluation 05/10/21   ? Authorization Type UHC Medicare   ? Authorization Time Period copay $20, no visit limit, authorization needed.   ? Progress Note Due on Visit 10   ? OT Start Time 1431   ? OT Stop Time 1512   ? OT Time Calculation (min) 41 min   ? Activity Tolerance Patient tolerated treatment well   ? Behavior During Therapy [facility] for tasks assessed/performed   ? ?  ?  ? ?  ? ? ?Past Medical History:  ?Diagnosis Date  ? Asthma   ? Bronchitis   ? COPD (chronic obstructive pulmonary disease) ([place])   ? Diabetes mellitus without complication ([place])   ? Heart murmur 08/24/2020  ? soft systolic murmur 1/6  ? Hypertension   ? Sleep apnea   ? Stroke [facility])   ? ?Past Surgical History:  ?Procedure Laterality Date  ? BUBBLE STUDY  09/18/2020  ? Procedure: BUBBLE STUDY;  Surgeon: Pixie Casino, MD;  Location: [facility] ENDOSCOPY;  Service: Cardiovascular;;  ? COLONOSCOPY N/A 09/18/2016  ? Procedure: COLONOSCOPY;  Surgeon: Danie Binder, MD;  [facility];  Service: Endoscopy;  Laterality: N/A;  10:30 AM  ? lipoma removal    ? TEE WITHOUT CARDIOVERSION N/A 09/18/2020  ? Procedure: TRANSESOPHAGEAL ECHOCARDIOGRAM (TEE);  Surgeon: Pixie Casino, MD;  Location: [place];  Service: Cardiovascular;  Laterality: N/A;  ? ?Patient Active Problem List  ? Diagnosis Date Noted  ? Dysphagia, post-stroke   ? Essential hypertension   ? Diabetic peripheral neuropathy ([place])   ? Infarction of left basal ganglia ([place]) 09/19/2020  ? Acute CVA (cerebrovascular accident) ([place]) 09/06/2020  ? Stroke [facility]) 11/23/2018  ? Numbness 11/23/2018  ? CVA (cerebral vascular accident) ([place]) 11/23/2018  ? Lung  nodule 11/17/2017  ? Chest pressure 11/22/2016  ? Shortness of breath 11/22/2016  ? Depression, major, single episode, moderate ([place]) 10/14/2016  ? Special screening for malignant neoplasms, colon   ? Chronic obstructive pulmonary disease ([place]) 06/08/2016  ? Hypertensive emergency 02/16/2016  ? Hypertensive urgency 06/07/2015  ? CKD (chronic kidney disease), stage III ([place]) 06/07/2015  ? Pain in the chest   ? Elevated troponin   ? Chronic diastolic CHF (congestive heart failure) ([place]) 09/17/2013  ? DM type 2 (diabetes mellitus, type 2) ([place]) 09/15/2013  ? Chest pain 08/01/2011  ? HTN (hypertension) 08/01/2011  ? Chest pain 07/22/2011  ? Renal insufficiency 07/22/2011  ? Tobacco abuse 07/22/2011  ? Hypertension   ? ? ?ONSET DATE: 09/06/2020 ? ?REFERRING DIAG: s/p CVA ? ?THERAPY DIAG:  ?Other symptoms and signs involving the nervous system ? ?Pain in right arm ? ?Other lack of coordination ? ? ?PERTINENT HISTORY: Patient is a 56 y/o male returning to this clinic for OT for right shoulder stiffness. 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 He received OT and PT at [facility] in [facility]. He then received outpatient OT service at this clinic 10/25/20-12/20/20.  At time of discharge patient overall met 3/6 therapy goals with 1 additional goal partially met. He was unable to demonstrate any functional movement in his RUE.  Right shoulder presented with less than one finger subluxation. He presented with increased flexor tone in the shoulder, elbow, and wrist which causes him increased pain and decreased tolerance during passive movement. Limited to half range or less passively with the right shoulder, elbow, and wrist. ? ? ?SUBJECTIVE: S: I'm not hurting today. ? ?PAIN:  ?Are you having pain? No ? ? ? ? ?OBJECTIVE:  ? ?TODAY'S TREATMENT:  ?-Myofascial release: completed separately from therapeutic exercises. Myofascial release and manual stretching completed to right upper arm,  upper trapezius, and scapularis region to decrease fascial restrictions and increase joint ROM. Massage gun utlilized to target larger areas of fascial restrictions and muscle tightness, level 5 at upper arm and trapezius regions  ?-P/ROM: Right shoulder-flexion, abduction, er/IR, horizontal ab/adduction, 5X each ?-P/ROM: Right elbow: flexion extension, forearm supination/pronation, 5X each ?-P/ROM: Right wrist: flexion/extension, 5X each ?-Self-ROM: shoulder protraction, flexion; 5X each in supine, 10X each in sitting ?-Scapular mobilization-elevation, depression, 10X  ? ?-Transfers: sit-to-stand, supervision level ? ? ? ? OT Short Term Goals   ? ?  ? OT SHORT TERM GOAL #1  ? Title Pt and caregiver will be educated on HEP to improve functional mobility of the RUE and allow for decreased difficulty during ADL completion.   ? Time 4   ? Period Weeks   ? Status On-going   ? Target Date 05/10/21   ?  ? OT SHORT TERM GOAL #2  ? Title Patient will increase shoulder, elbow, and wrist P/ROM by 10 degrees or more where needed in order to decrease difficulty when participating in bathing and dressing tasks.   ? Time 4   ? Period Weeks   ? Status On-going   ? ?  ?  ? ?  ? ? ? ? Plan - 05/04/21 1515   ? ? Clinical Impression Statement A: Pt reports continued HEP completion. Continued with myofascial release to address fascial restrictions and tone in RUE today, continued with massage gun work for trapezius. Continued with P/ROM to RUE, only to shoulder level due to subluxation. Added self-ROM in supine and sitting, scapular mobilizations. Verbal and visual cuing for form and technique.   ? Body Structure / Function / Physical Skills Pain;ROM   ? Plan P: Reassessment   ? OT Home Exercise Plan eval: continue with current HEP.   ? Consulted and Agree with Plan of Care Patient;Family member/caregiver   ? Family Member Consulted Wife, [name]   ? ?  ?  ? ?  ? ? ? ?Guadelupe Sabin, OTR/L  ?[phone] ?05/04/2021, 3:16 PM ? ?  ? ? ? ?

## 2021-05-08 ENCOUNTER — Encounter (HOSPITAL_COMMUNITY): Payer: Self-pay

## 2021-05-08 ENCOUNTER — Ambulatory Visit (HOSPITAL_COMMUNITY): Payer: Medicare Other

## 2021-05-08 ENCOUNTER — Other Ambulatory Visit: Payer: Self-pay

## 2021-05-08 DIAGNOSIS — R278 Other lack of coordination: Secondary | ICD-10-CM | POA: Diagnosis not present

## 2021-05-08 DIAGNOSIS — R262 Difficulty in walking, not elsewhere classified: Secondary | ICD-10-CM | POA: Diagnosis not present

## 2021-05-08 DIAGNOSIS — R29818 Other symptoms and signs involving the nervous system: Secondary | ICD-10-CM | POA: Diagnosis not present

## 2021-05-08 DIAGNOSIS — M79601 Pain in right arm: Secondary | ICD-10-CM | POA: Diagnosis not present

## 2021-05-08 DIAGNOSIS — M6281 Muscle weakness (generalized): Secondary | ICD-10-CM

## 2021-05-08 NOTE — Therapy (Signed)
?OUTPATIENT PHYSICAL THERAPY TREATMENT NOTE ? ? ?Patient Name: Craig Knight ?MRN: [id number] ?DOB:02-Jul-1965, 56 y.o., male ?Today's Date: 05/08/2021 ? ?PCP: Carrolyn Meiers, MD ?REFERRING PROVIDER: Carrolyn Meiers* ? ? PT End of Session - 05/08/21 1604   ? ? Visit Number 8   ? Number of Visits 16   ? Date for PT Re-Evaluation 06/01/21   ? Authorization Type UHC Medicare   ? Progress Note Due on Visit 10   ? PT Start Time 7829   ? PT Stop Time 5621   ? PT Time Calculation (min) 39 min   ? Equipment Utilized During Treatment Gait belt   ? Activity Tolerance Patient limited by fatigue;Patient tolerated treatment well   ? Behavior During Therapy [facility] for tasks assessed/performed   ? ?  ?  ? ?  ? ? ?Past Medical History:  ?Diagnosis Date  ? Asthma   ? Bronchitis   ? COPD (chronic obstructive pulmonary disease) ([place])   ? Diabetes mellitus without complication ([place])   ? Heart murmur 08/24/2020  ? soft systolic murmur 1/6  ? Hypertension   ? Sleep apnea   ? Stroke [facility])   ? ?Past Surgical History:  ?Procedure Laterality Date  ? BUBBLE STUDY  09/18/2020  ? Procedure: BUBBLE STUDY;  Surgeon: Pixie Casino, MD;  Location: [facility] ENDOSCOPY;  Service: Cardiovascular;;  ? COLONOSCOPY N/A 09/18/2016  ? Procedure: COLONOSCOPY;  Surgeon: Danie Binder, MD;  [facility] ENDO [facility];  Service: Endoscopy;  Laterality: N/A;  10:30 AM  ? lipoma removal    ? TEE WITHOUT CARDIOVERSION N/A 09/18/2020  ? Procedure: TRANSESOPHAGEAL ECHOCARDIOGRAM (TEE);  Surgeon: Pixie Casino, MD;  Location: [place];  Service: Cardiovascular;  Laterality: N/A;  ? ?Patient Active Problem List  ? Diagnosis Date Noted  ? Dysphagia, post-stroke   ? Essential hypertension   ? Diabetic peripheral neuropathy ([place])   ? Infarction of left basal ganglia ([place]) 09/19/2020  ? Acute CVA (cerebrovascular accident) ([place]) 09/06/2020  ? Stroke [facility]) 11/23/2018  ? Numbness 11/23/2018  ? CVA (cerebral vascular accident) ([place]) 11/23/2018  ? Lung nodule  11/17/2017  ? Chest pressure 11/22/2016  ? Shortness of breath 11/22/2016  ? Depression, major, single episode, moderate ([place]) 10/14/2016  ? Special screening for malignant neoplasms, colon   ? Chronic obstructive pulmonary disease ([place]) 06/08/2016  ? Hypertensive emergency 02/16/2016  ? Hypertensive urgency 06/07/2015  ? CKD (chronic kidney disease), stage III ([place]) 06/07/2015  ? Pain in the chest   ? Elevated troponin   ? Chronic diastolic CHF (congestive heart failure) ([place]) 09/17/2013  ? DM type 2 (diabetes mellitus, type 2) ([place]) 09/15/2013  ? Chest pain 08/01/2011  ? HTN (hypertension) 08/01/2011  ? Chest pain 07/22/2011  ? Renal insufficiency 07/22/2011  ? Tobacco abuse 07/22/2011  ? Hypertension   ? ? ?REFERRING DIAG: CVA  ? ?THERAPY DIAG:  ?Muscle weakness (generalized) ? ?Difficulty in walking, not elsewhere classified ? ?PERTINENT HISTORY: CVA  ? ?PRECAUTIONS: none  ? ?SUBJECTIVE: Pt stated he is tired from OT session and sore from sessions before. ? ?PAIN:  ?Are you having pain? Yes: NPRS scale: Number not stated/10 ?Pain location: generalized soreness ?Pain description: sore ?Aggravating factors: bed and using bathroom ?Relieving factors: tylenol ? ? ? ? ?TODAY'S TREATMENT:  ? ? 05/08/21 0001  ?Bed Mobility  ?Rolling Right Supervision/verbal cueing  ?Rolling Left Contact Guard/Touching assist;Minimal Assistance - Patient > 75%  ?Transfers  ?Transfers Sit to Stand  ?Sit to Stand  5: Supervision  ?Five time sit to stand comments  Cueing for mechanics, elevated height slghtly, no HHA  ?Knee/Hip Exercises: Stretches  ?Other Knee/Hip Stretches POE  ?Knee/Hip Exercises: Prone  ?Other Prone Exercises quadruped on green ball including weight shifting, transition to tall kneeling, push/pull ball standing tall  ?Other Prone Exercises crawling with green ball under, tall kneel walking forward/backward  ? ? ? ? ?PATIENT EDUCATION: ? ?HOME EXERCISE PROGRAM: ?Eval: LAQ with tapping to excite mm,sitting hip adduction  isometrically, attempt heel raise, toe raise;  Supine:  PROM for RT dorsiflexion, heel slide, hip ab/adduction, SLR and bridge.  ? ? PT Short Term Goals - 05/03/21 1357   ? ?  ? PT SHORT TERM GOAL #1  ? Title Patient to be I in all bed mobilities   ? Time 3   ? Period Weeks   ? Status On-going   ? Target Date 04/27/21   ?  ? PT SHORT TERM GOAL #2  ? Title Pt mm strength to increase 1/2 grade in Rt LE to allow pt to come sit to full standing position and stand for two minutes I witout UE assist .   ? Time 3   ? Period Weeks   ? Status On-going   ?  ? PT SHORT TERM GOAL #3  ? Title patient to ambulate 75f with LRAD and CGA   ? Time 3   ? Period Weeks   ? Status On-going   ? ?  ?  ? ?  ? ? ? PT Long Term Goals - 05/03/21 1357   ? ?  ? PT LONG TERM GOAL #1  ? Title Patient will be able to ambualte 100 feet using hemiwalker   ? Time 6   ? Period Weeks   ? Status On-going   ?  ? PT LONG TERM GOAL #2  ? Title Pt mm strength will be increased one grade to allow pt to be able to step up 2 steps with a hand rail and Contace guard assist   ? Time 8   ? Period Weeks   ? Status On-going   ? ?  ?  ? ?  ? ? ? Plan - 05/08/21 1605   ? ? Clinical Impression Statement Increased time to complete transfers from sitting, rolling to prone per increased fatigue following OT session.  Continued session focus with transfer training and therex for core and proximal strengthening in quadruped and tall kneeling positions.  2 therapists with mod assist to assist with prone to quadruped wiht use of theraball to assist with weight bearing.  Able to acheived tall kneeling with ball push/pull wiht no assistance once in position.   ? Personal Factors and Comorbidities Time since onset of injury/illness/exacerbation;Fitness   ? Comorbidities multiple CVAs   ? Examination-Participation Restrictions Community Activity;Driving;Cleaning   ? Stability/Clinical Decision Making Stable/Uncomplicated   ? Clinical Decision Making Low   ? Rehab Potential Fair    ? PT Frequency 2x / week   ? PT Duration 8 weeks   ? PT Treatment/Interventions ADLs/Self Care Home Management;Aquatic Therapy;DME Instruction;Gait training;Stair training;Functional mobility training;Therapeutic activities;Therapeutic exercise;Balance training;Neuromuscular re-education;Patient/family education;Orthotic Fit/Training;Passive range of motion;Wheelchair mobility training;Visual/perceptual remediation/compensation;Energy conservation;Spinal Manipulations;Splinting;Joint Manipulations;Taping;Vasopneumatic Device;Compression bandaging;Manual techniques;Dry needling;Cognitive remediation;Electrical Stimulation;Cryotherapy;Biofeedback;Moist Heat;Traction;Iontophoresis '4mg'$ /ml Dexamethasone   ? PT Next Visit Plan Continue with tripod first thing before pt fatigues with use of theraball assistance.  Continue with I in  bed mobility, Mat exercises for strengthening, sit to stand standing balance.  Progress  to gait with hemiwalkerl   ? Consulted and Agree with Plan of Care Patient;Family member/caregiver   wife  ? Family Member Consulted wife   ? ?  ?  ? ?  ? ? ? ?Ihor Austin, LPTA/CLT; CBIS ?[phone] ? ?Aldona Lento, PTA ?05/08/2021, 4:13 PM ? ?  ? ?

## 2021-05-09 NOTE — Therapy (Signed)
[facility] ?[address] ?[place], [place], [address] ?Phone: [phone]   Fax:  [phone] ? ?Occupational Therapy Treatment ?Reassessment/re-cert ?Patient Details  ?Name: Craig Knight ?MRN: [id number] ?Date of Birth: 1965-09-06 ?Referring Provider (OT): Alysia Penna, MD ? ? ?Encounter Date: 05/08/2021 ? ? OT End of Session - 05/09/21 1242   ? ? Visit Number 5   ? Number of Visits 8   ? Date for OT Re-Evaluation 05/23/21   ? Authorization Type UHC Medicare   ? Authorization Time Period copay $20, no visit limit, no authorization needed.   ? Progress Note Due on Visit 10   ? OT Start Time 1300   reassessment  ? OT Stop Time 1340   ? OT Time Calculation (min) 40 min   ? Activity Tolerance Patient tolerated treatment well   ? Behavior During Therapy [facility] for tasks assessed/performed   ? ?  ?  ? ?  ? ? ?Past Medical History:  ?Diagnosis Date  ? Asthma   ? Bronchitis   ? COPD (chronic obstructive pulmonary disease) ([place])   ? Diabetes mellitus without complication ([place])   ? Heart murmur 08/24/2020  ? soft systolic murmur 1/6  ? Hypertension   ? Sleep apnea   ? Stroke [facility])   ? ? ?Past Surgical History:  ?Procedure Laterality Date  ? BUBBLE STUDY  09/18/2020  ? Procedure: BUBBLE STUDY;  Surgeon: Pixie Casino, MD;  Location: [facility] ENDOSCOPY;  Service: Cardiovascular;;  ? COLONOSCOPY N/A 09/18/2016  ? Procedure: COLONOSCOPY;  Surgeon: Danie Binder, MD;  [facility];  Service: Endoscopy;  Laterality: N/A;  10:30 AM  ? lipoma removal    ? TEE WITHOUT CARDIOVERSION N/A 09/18/2020  ? Procedure: TRANSESOPHAGEAL ECHOCARDIOGRAM (TEE);  Surgeon: Pixie Casino, MD;  Location: [place];  Service: Cardiovascular;  Laterality: N/A;  ? ? ?There were no vitals filed for this visit. ? ? Subjective Assessment - 05/08/21 1312   ? ? Subjective  S: I think it's moving better.   ? Patient is accompanied by: Family member   Wife: Craig Knight  ? Currently in Pain? No/denies   ? ?  ?   ? ?  ? ? ? ? ? [place] OT Assessment - 05/08/21 1312   ? ?  ? Assessment  ? Medical Diagnosis right shoulder stiffness   ? Referring Provider (OT) Alysia Penna, MD   ? Onset Date/Surgical Date 09/11/20   ?  ? Precautions  ? Precautions Other (comment)   ? Precaution Comments right side hemiplegia   ?  ? Observation/Other Assessments  ? Focus on Therapeutic Outcomes (FOTO)  N/A   ?  ? Palpation  ? Palpation comment 1 finger subluxation right shoulder   ?  ? AROM  ? Overall AROM Comments No active movement demonstrated in the right UE.   ?  ? PROM  ? Overall PROM Comments Assessed supine. IR/er adducted   ? PROM Assessment Site Shoulder;Elbow;Forearm;Wrist   ? Right/Left Shoulder Right   ? Right Shoulder Flexion 111 Degrees   previous: 100  ? Right Shoulder ABduction 113 Degrees   previous: 85  ? Right Shoulder Internal Rotation 90 Degrees   previous: same  ? Right Shoulder External Rotation 0 Degrees   previous: same  ? Right/Left Elbow Right   ? Right Elbow Flexion 140   previous: same  ? Right Elbow Extension -8   previous: -15  ? Right/Left Wrist Right   ?  Right Wrist Extension 62 Degrees   previous: 60  ? Right Wrist Flexion 70 Degrees   previous: 68  ?  ? Strength  ? Overall Strength Comments seated shoulder flexion: 1/5, shoulder extension: 0/5, elbow flexion/extension: 0/5, gross grasp: none.   previous: seated shoulder flexion: 1/5, shoulder extension: 1/5, elbow flexion/extension: 0/5, gross grasp: none.  ? ?  ?  ? ?  ? ? ? ? ? ? ? ? ? ? ? ? ? ? ? ? ? ? ? OT Education - 05/09/21 1241   ? ? Education Details Reviewed HEP. Highlighted 2nd exercise in HEP which is passive er.   ? Person(s) Educated Patient;Spouse   ? Methods Explanation;Demonstration   ? Comprehension Verbalized understanding   ? ?  ?  ? ?  ? ? ? OT Short Term Goals - 05/08/21 1338   ? ?  ? OT SHORT TERM GOAL #1  ? Title Pt and caregiver will be educated on HEP to improve functional mobility of the RUE and allow for decreased difficulty  during ADL completion.   ? Time 4   ? Period Weeks   ? Status Achieved   ? Target Date 05/10/21   ?  ? OT SHORT TERM GOAL #2  ? Title Patient will increase shoulder, elbow, and wrist P/ROM by 10 degrees or more where needed in order to decrease difficulty when participating in bathing and dressing tasks.   ? Time 4   ? Period Weeks   ? Status Partially Met   ? ?  ?  ? ?  ? ? ? ? ? ? ? ? ? ? ? Plan - 05/09/21 1244   ? ? Clinical Impression Statement A: reassessment completed this date. patient has met 1 out of 2 therapy goals. Partially met 2nd goal. Pt reports that he feels like his arm is moving better. Reviewed the focus on OT during this plan of care is not to work on achieving A/ROM but to work on increase his passive ROM in order to increase the ability to complete bathing, dressing, etc. with his wife. Patient has increase his RUE P/ROM; where needed; by 10 degrees with his shoulder, elbow, or wrist. He did not achieve a 10 degree increase with shoulder external rotation or elbow extension. Recommended continuing skilled OT services twice a week until the end of the month focusing on these movements specifically.   ? Body Structure / Function / Physical Skills Pain;ROM   ? OT Frequency 2x / week   ? OT Duration 2 weeks   ? Plan P: Continued skilled OT services focusing on increasing P/ROM for shoulder er and elbow extension in order to decrease the level of difficulty when completing dressing, bathing, etc. with wife.   ? Consulted and Agree with Plan of Care Patient;Family member/caregiver   ? Family Member Consulted Wife, Craig Knight   ? ?  ?  ? ?  ? ? ?Patient will benefit from skilled therapeutic intervention in order to improve the following deficits and impairments:   ?Body Structure / Function / Physical Skills: Pain, ROM ?  ?  ? ? ?Visit Diagnosis: ?Other symptoms and signs involving the nervous system ? ?Pain in right arm ? ? ? ?Problem List ?Patient Active Problem List  ? Diagnosis Date Noted  ? Dysphagia,  post-stroke   ? Essential hypertension   ? Diabetic peripheral neuropathy ([place])   ? Infarction of left basal ganglia ([place]) 09/19/2020  ? Acute CVA (cerebrovascular accident) ([place])  09/06/2020  ? Stroke [facility]) 11/23/2018  ? Numbness 11/23/2018  ? CVA (cerebral vascular accident) ([place]) 11/23/2018  ? Lung nodule 11/17/2017  ? Chest pressure 11/22/2016  ? Shortness of breath 11/22/2016  ? Depression, major, single episode, moderate ([place]) 10/14/2016  ? Special screening for malignant neoplasms, colon   ? Chronic obstructive pulmonary disease ([place]) 06/08/2016  ? Hypertensive emergency 02/16/2016  ? Hypertensive urgency 06/07/2015  ? CKD (chronic kidney disease), stage III ([place]) 06/07/2015  ? Pain in the chest   ? Elevated troponin   ? Chronic diastolic CHF (congestive heart failure) ([place]) 09/17/2013  ? DM type 2 (diabetes mellitus, type 2) ([place]) 09/15/2013  ? Chest pain 08/01/2011  ? HTN (hypertension) 08/01/2011  ? Chest pain 07/22/2011  ? Renal insufficiency 07/22/2011  ? Tobacco abuse 07/22/2011  ? Hypertension   ? ? ?Ailene Ravel, OTR/L,CBIS  ?[phone] ? ?05/09/2021, 12:52 PM ? ?[place] [facility] ?[address] ?[place], [place], [address] ?Phone: [phone]   Fax:  [phone] ? ?Name: SHAMELL SUAREZ ?MRN: [id number] ?Date of Birth: 1966-02-13 ? ?

## 2021-05-10 ENCOUNTER — Encounter (HOSPITAL_COMMUNITY): Payer: Self-pay | Admitting: Physical Therapy

## 2021-05-10 ENCOUNTER — Other Ambulatory Visit: Payer: Self-pay

## 2021-05-10 ENCOUNTER — Ambulatory Visit (HOSPITAL_COMMUNITY): Payer: Medicare Other | Admitting: Physical Therapy

## 2021-05-10 DIAGNOSIS — R262 Difficulty in walking, not elsewhere classified: Secondary | ICD-10-CM | POA: Diagnosis not present

## 2021-05-10 DIAGNOSIS — M79601 Pain in right arm: Secondary | ICD-10-CM | POA: Diagnosis not present

## 2021-05-10 DIAGNOSIS — M6281 Muscle weakness (generalized): Secondary | ICD-10-CM | POA: Diagnosis not present

## 2021-05-10 DIAGNOSIS — R278 Other lack of coordination: Secondary | ICD-10-CM | POA: Diagnosis not present

## 2021-05-10 DIAGNOSIS — R29818 Other symptoms and signs involving the nervous system: Secondary | ICD-10-CM | POA: Diagnosis not present

## 2021-05-10 NOTE — Therapy (Signed)
?OUTPATIENT PHYSICAL THERAPY TREATMENT NOTE ? ? ?Patient Name: Craig Knight ?MRN: [id number] ?DOB:Sep 17, 1965, 56 y.o., male ?Today's Date: 05/10/2021 ? ?PCP: Carrolyn Meiers, MD ?REFERRING PROVIDER: Carrolyn Meiers* ? ? PT End of Session - 05/10/21 1408   ? ? Number of Visits 9/16   ? Date for PT Re-Evaluation 06/01/21   ? Authorization Type UHC Medicare   ? Progress Note Due on Visit 10   ? Equipment Utilized During Treatment Gait belt   ? Activity Tolerance Patient limited by fatigue;Patient tolerated treatment well   ? Behavior During Therapy [facility] for tasks assessed/performed   ? ?Time:  2:10- 2:48  PM pt late   ?  ? ?  ? ? ?Past Medical History:  ?Diagnosis Date  ? Asthma   ? Bronchitis   ? COPD (chronic obstructive pulmonary disease) ([place])   ? Diabetes mellitus without complication ([place])   ? Heart murmur 08/24/2020  ? soft systolic murmur 1/6  ? Hypertension   ? Sleep apnea   ? Stroke [facility])   ? ? ? ?Patient Active Problem List  ? Diagnosis Date Noted  ? Dysphagia, post-stroke   ? Essential hypertension   ? Diabetic peripheral neuropathy ([place])   ? Infarction of left basal ganglia ([place]) 09/19/2020  ? Acute CVA (cerebrovascular accident) ([place]) 09/06/2020  ? Stroke [facility]) 11/23/2018  ? Numbness 11/23/2018  ? CVA (cerebral vascular accident) ([place]) 11/23/2018  ? Lung nodule 11/17/2017  ? Chest pressure 11/22/2016  ? Shortness of breath 11/22/2016  ? Depression, major, single episode, moderate ([place]) 10/14/2016  ? Special screening for malignant neoplasms, colon   ? Chronic obstructive pulmonary disease ([place]) 06/08/2016  ? Hypertensive emergency 02/16/2016  ? Hypertensive urgency 06/07/2015  ? CKD (chronic kidney disease), stage III ([place]) 06/07/2015  ? Pain in the chest   ? Elevated troponin   ? Chronic diastolic CHF (congestive heart failure) ([place]) 09/17/2013  ? DM type 2 (diabetes mellitus, type 2) ([place]) 09/15/2013  ? Chest pain 08/01/2011  ? HTN (hypertension) 08/01/2011  ? Chest pain 07/22/2011  ?  Renal insufficiency 07/22/2011  ? Tobacco abuse 07/22/2011  ? Hypertension   ? ? ?REFERRING DIAG: CVA ? ?THERAPY DIAG:  ?Difficulty in walking, not elsewhere classified ? ?Muscle weakness (generalized) ? ?PERTINENT HISTORY: CVA, HTN , CKD ? ?PRECAUTIONS: falls  ? ?SUBJECTIVE: Pt states walking at home is easier.  He is doing his exercises. ? ?PAIN:  ?Are you having pain? No  ? ? ? ?TODAY'S TREATMENT:  ?Sit to stand x 10 x 2 ?  ?Standing:  Take Rt leg forward x 10; to the side x 10  ? ?Bed mobility:  sit to supine, supine to RT sidelying, supine to Lt sidelying all x 5  ?  ?Supine bridge x 10 ; hip isometric for adduction/abduction x 10  ? ? ? ? ? ? ?PATIENT EDUCATION: ?Education details: reviewed bed mobility, the importance of stretching out pt RT knee contracture ?Person educated: Patient ?Education method: Explanation ?Education comprehension: verbalized understanding ? ? ?HOME EXERCISE PROGRAM: ?Eval: LAQ with tapping to excite mm,sitting hip adduction isometrically, attempt heel raise, toe raise;  Supine:  PROM for RT dorsiflexion, heel slide, hip ab/adduction, SLR and bridge.  ?  ? ? PT Short Term Goals - 05/10/21 1408   ? ?  ? PT SHORT TERM GOAL #1  ? Title Patient to be I in all bed mobilities   ? Time 3   ? Period Weeks   ? Status  On-going   ? Target Date 04/27/21   ?  ? PT SHORT TERM GOAL #2  ? Title Pt mm strength to increase 1/2 grade in Rt LE to allow pt to come sit to full standing position and stand for two minutes I witout UE assist .   ? Time 3   ? Period Weeks   ? Status On-going   ?  ? PT SHORT TERM GOAL #3  ? Title patient to ambulate 40f with LRAD and CGA   ? Time 3   ? Period Weeks   ? Status On-going   ? ?  ?  ? ?  ? ? ? PT Long Term Goals - 05/10/21 1408   ? ?  ? PT LONG TERM GOAL #1  ? Title Patient will be able to ambualte 100 feet using hemiwalker   ? Time 6   ? Period Weeks   ? Status On-going   ?  ? PT LONG TERM GOAL #2  ? Title Pt mm strength will be increased one grade to allow pt to  be able to step up 2 steps with a hand rail and Contace guard assist   ? Time 8   ? Period Weeks   ? Status On-going   ? ?  ?  ? ?  ? ?  ?Clinical Impression Statement Session worked on standing erect with advancement of Rt LE. If pt hip can increase in strength and endurance pt may benefit from a KFO to assist in ambulation. Therapist has not worked on standing with RT foot advancement for a couple of weeks as pt was not able to complete this activity without significant help therefore therapist was working in tripod and tall kneeling to advance LE. Noted improved ability to advance Rt LE when standing     ? ? Plan - 05/10/21 1554   ? ? Personal Factors and Comorbidities Time since onset of injury/illness/exacerbation;Fitness   ? Comorbidities multiple CVAs   ? Examination-Participation Restrictions Community Activity;Driving;Cleaning   ? Stability/Clinical Decision Making Stable/Uncomplicated   ? Rehab Potential Fair   ? PT Frequency 2x / week   ? PT Duration 8 weeks   ? PT Treatment/Interventions ADLs/Self Care Home Management;Aquatic Therapy;DME Instruction;Gait training;Stair training;Functional mobility training;Therapeutic activities;Therapeutic exercise;Balance training;Neuromuscular re-education;Patient/family education;Orthotic Fit/Training;Passive range of motion;Wheelchair mobility training;Visual/perceptual remediation/compensation;Energy conservation;Spinal Manipulations;Splinting;Joint Manipulations;Taping;Vasopneumatic Device;Compression bandaging;Manual techniques;Dry needling;Cognitive remediation;Electrical Stimulation;Cryotherapy;Biofeedback;Moist Heat;Traction;Iontophoresis '4mg'$ /ml Dexamethasone   ? PT Next Visit Plan Complete 10th visit reassessment.  Continue with tripod first thing before pt fatigues with use of theraball assistance.  Continue with I in  bed mobility, Mat exercises for strengthening, sit to stand standing balance.  Progress to gait with hemiwalkerl   ? Consulted and Agree with  Plan of Care Patient;Family member/caregiver   wife  ? Family Member Consulted wife   ? ?  ?  ? ?  ? ? ? ?CRayetta Humphrey PT CLT ?[phone]/16/2023, 3:54 PM ? ?   ?

## 2021-05-14 DIAGNOSIS — I639 Cerebral infarction, unspecified: Secondary | ICD-10-CM | POA: Diagnosis not present

## 2021-05-14 DIAGNOSIS — R609 Edema, unspecified: Secondary | ICD-10-CM | POA: Diagnosis not present

## 2021-05-14 DIAGNOSIS — G8191 Hemiplegia, unspecified affecting right dominant side: Secondary | ICD-10-CM | POA: Diagnosis not present

## 2021-05-15 ENCOUNTER — Encounter (HOSPITAL_COMMUNITY): Payer: Medicare Other

## 2021-05-15 ENCOUNTER — Ambulatory Visit (HOSPITAL_COMMUNITY): Payer: Medicare Other | Admitting: Physical Therapy

## 2021-05-17 ENCOUNTER — Encounter (HOSPITAL_COMMUNITY): Payer: Self-pay | Admitting: Physical Therapy

## 2021-05-17 ENCOUNTER — Ambulatory Visit (HOSPITAL_COMMUNITY): Payer: Medicare Other | Admitting: Physical Therapy

## 2021-05-17 ENCOUNTER — Other Ambulatory Visit: Payer: Self-pay

## 2021-05-17 DIAGNOSIS — R262 Difficulty in walking, not elsewhere classified: Secondary | ICD-10-CM | POA: Diagnosis not present

## 2021-05-17 DIAGNOSIS — R278 Other lack of coordination: Secondary | ICD-10-CM | POA: Diagnosis not present

## 2021-05-17 DIAGNOSIS — R29818 Other symptoms and signs involving the nervous system: Secondary | ICD-10-CM | POA: Diagnosis not present

## 2021-05-17 DIAGNOSIS — M6281 Muscle weakness (generalized): Secondary | ICD-10-CM

## 2021-05-17 DIAGNOSIS — M79601 Pain in right arm: Secondary | ICD-10-CM | POA: Diagnosis not present

## 2021-05-17 NOTE — Therapy (Signed)
?OUTPATIENT PHYSICAL THERAPY TREATMENT NOTE ?PHYSICAL THERAPY DISCHARGE SUMMARY ? ?Visits from Start of Care: 10 ? ?Current functional level related to goals / functional outcomes: ?PT is at the same functioning level  ?  ?Remaining deficits: ?Needs assist for bed mobility, unable to ambulate ?  ?Education / Equipment: ?HEP  ? ?Patient agrees to discharge. Patient goals were not met. Patient is being discharged due to lack of progress.  ? ?Patient Name: Craig Knight ?MRN: [id number] ?DOB:[birth year], 56 y.o., male ?Today's Date: 05/17/2021 ? ?PCP: Carrolyn Meiers, MD ?REFERRING PROVIDER: Carrolyn Meiers* ?Progress Note ?Reporting Period 04/06/2021 to 05/17/2021 ? ?See note below for Objective Data and Assessment of Progress/Goals.  ? ?  ? PT End of Session - 05/17/21   ? ? Number of Visits 10/10  ? Date for PT Re-Evaluation 06/01/21   ? Authorization Type [employer] Medicare   ? Progress Note Due on Visit 10   ? Equipment Utilized During Treatment Gait belt   ? Activity Tolerance Patient limited by fatigue;Patient tolerated treatment well   ? Behavior During Therapy [facility] for tasks assessed/performed   ? ?Time:  2:00- 2:43  ?  ? ?  ? ? ?Past Medical History:  ?Diagnosis Date  ? Asthma   ? Bronchitis   ? COPD (chronic obstructive pulmonary disease) ([place])   ? Diabetes mellitus without complication ([place])   ? Heart murmur 08/24/2020  ? soft systolic murmur 1/6  ? Hypertension   ? Sleep apnea   ? Stroke [facility])   ? ? ? ?Patient Active Problem List  ? Diagnosis Date Noted  ? Dysphagia, post-stroke   ? Essential hypertension   ? Diabetic peripheral neuropathy ([place])   ? Infarction of left basal ganglia ([place]) 09/19/2020  ? Acute CVA (cerebrovascular accident) ([place]) 09/06/2020  ? Stroke [facility]) 11/23/2018  ? Numbness 11/23/2018  ? CVA (cerebral vascular accident) ([place]) 11/23/2018  ? Lung nodule 11/17/2017  ? Chest pressure 11/22/2016  ? Shortness of breath 11/22/2016  ? Depression, major, single episode, moderate ([place])  10/14/2016  ? Special screening for malignant neoplasms, colon   ? Chronic obstructive pulmonary disease ([place]) 06/08/2016  ? Hypertensive emergency 02/16/2016  ? Hypertensive urgency 06/07/2015  ? CKD (chronic kidney disease), stage III ([place]) 06/07/2015  ? Pain in the chest   ? Elevated troponin   ? Chronic diastolic CHF (congestive heart failure) ([place]) 09/17/2013  ? DM type 2 (diabetes mellitus, type 2) ([place]) 09/15/2013  ? Chest pain 08/01/2011  ? HTN (hypertension) 08/01/2011  ? Chest pain 07/22/2011  ? Renal insufficiency 07/22/2011  ? Tobacco abuse 07/22/2011  ? Hypertension   ? ? ?REFERRING DIAG: CVA ? ?THERAPY DIAG:  ?Difficulty in walking, not elsewhere classified ? ?Muscle weakness (generalized) ? ?PERTINENT HISTORY: CVA, HTN , CKD ? ?PRECAUTIONS: falls  ? ?SUBJECTIVE: Pt states walking at home is easier.  He is doing his exercises. ? ?PAIN:  ?Are you having pain? No  ? ? ? ?TODAY'S TREATMENT: 3.23/2023 ? ?Pt reassessed today with the following results: ? ? ? ?    ?  Strength  ?  Right Hip Flexion 2/5 was 2/5   on 10/27 was 2-  ?  Right Hip Extension 2-/5 was 2-/5   was 1  ?  Right Hip ABduction  2-/5 was 2-/5   1/5  ?  Right Hip ADduction 2+/5 was 2/5   was 2  ?  Left Hip Flexion 5/5   ?  Right Knee Flexion 1/5 was 1/5  was 1/5  ?  Right Knee Extension 2-/5 was 2+/5   was 1/5  ?  Right Ankle Dorsiflexion 1/5 was 1/5   was 0/5  ?  Right Ankle Plantar Flexion 2-/5 was 2-/5   was 1/5  ?     ?  Bed Mobility  ?  Bed Mobility Sit to Supine;Supine to Sit   ?  Rolling Right Mod I was Minimal Assistance - Patient > 75%   ?  Rolling Left Min assist was  ?Minimal Assistance - Patient > 75%   ?  Right Sidelying to Sit Minimal Assistance - Patient > 75%   ?  Supine to Sit Minimal Assistance - Patient > 75%   ?  Sit to Supine Minimal Assistance - Patient > 75%  no change   ?     ?  Transfers  ?  Transfers Sit to [employer] Transfers   ?  Sit to Stand 4: Min guard   ?  Stand Pivot Transfers 4: Min guard    ?Supine: ?Bridges x 5 ?Heel slides x 5 ?Hip abduction AA x 5; adduction with resistance. ?Lt side lying: ?AA knee flexion/extension x 5  ? ? ?PATIENT EDUCATION: ?Education details: reviewed bed mobility, the importance of stretching out pt RT knee contracture. To continue completing HEP and if pt notices he is able to move his LE easier return to skilled therapy for a reassessment.  ?Person educated: Patient, wife  ?Education method: Explanation ?Education comprehension: verbalized understanding ? ?HOME EXERCISE PROGRAM:  From evaluation remains appropriate.   ?Eval: LAQ with tapping to excite mm,sitting hip adduction isometrically, attempt heel raise, toe raise;  Supine:  PROM for RT dorsiflexion, heel slide, hip ab/adduction, SLR and bridge.  ?  ? ? PT Short Term Goals -   ? ?  ? PT SHORT TERM GOAL #1  ? Title Patient to be I in all bed mobilities   ? Time 3   ? Period Weeks   ? Status Not met   ? Target Date 04/27/21   ?  ? PT SHORT TERM GOAL #2  ? Title Pt mm strength to increase 1/2 grade in Rt LE to allow pt to come sit to full standing position and stand for two minutes I witout UE assist .   ? Time 3   ? Period Weeks   ? Status Not met   ?  ? PT SHORT TERM GOAL #3  ? Title patient to ambulate 81f with LRAD and CGA   ? Time 3   ? Period Weeks   ? Status Not met   ? ?  ?  ? ?  ? ? ? PT Long Term Goals -   ? ?  ? PT LONG TERM GOAL #1  ? Title Patient will be able to ambualte 100 feet using hemiwalker   ? Time 6   ? Period Weeks   ? Status Not met   ?  ? PT LONG TERM GOAL #2  ? Title Pt mm strength will be increased one grade to allow pt to be able to step up 2 steps with a hand rail and Contace guard assist   ? Time 8   ? Period Weeks   ? Status Not met   ? ?  ?  ? ?  ? ?  ?      ? ? ?Plan -   ?  ?  Clinical Impression Statement PT reassessed with minimal changes in bed  mobility and no changes with LE strength.  Therapist discussed with pt and wife that at this time we will stop skilled therapy and have them  work on his HEP.  If pt notes improved strength in his right LE he should return for further assessment.   ?  Personal Factors and Comorbidities Time since onset of injury/illness/exacerbation;Fitness   ?  Comorbidities multiple CVAs   ?  Examination-Activity Limitations Transfers;Locomotion Level;Bed Mobility;Squat;Stairs;Stand   ?  Examination-Participation Restrictions Community Activity;Driving;Cleaning   ?  Stability/Clinical Decision Making Stable/Uncomplicated   ?  Clinical Decision Making Low   ?  Rehab Potential Fair   ?  PT Frequency 2x / week   ?  PT Duration 8 weeks   ?  PT Treatment/Interventions ADLs/Self Care Home Management;Aquatic Therapy;DME Instruction;Gait training;Stair training;Functional mobility training;Therapeutic activities;Therapeutic exercise;Balance training;Neuromuscular re-education;Patient/family education;Orthotic Fit/Training;Passive range of motion;Wheelchair mobility training;Visual/perceptual remediation/compensation;Energy conservation;Spinal Manipulations;Splinting;Joint Manipulations;Taping;Vasopneumatic Device;Compression bandaging;Manual techniques;Dry needling;Cognitive remediation;Electrical Stimulation;Cryotherapy;Biofeedback;Moist Heat;Traction;Iontophoresis 8m/ml Dexamethasone   ?  PT Next Visit Plan Discharge secondary to no significant improvement.   ?  PT Home Exercise Plan Eval: LAQ with tapping to excite mm,sitting hip adduction isometrically, attempt heel raise, toe raise;  Supine:  PROM for RT dorsiflexion, heel slide, hip ab/adduction, SLR and bridge.   ?  Consulted and Agree with Plan of Care Patient;Family member/caregiver   ?  Family Member Consulted wife   ? ? ? ? ?CRayetta Humphrey PT CLT ?[phone]/23/2023, 2:45 PM ? ?   ?

## 2021-05-18 ENCOUNTER — Encounter (HOSPITAL_COMMUNITY): Payer: Medicare Other | Admitting: Occupational Therapy

## 2021-05-22 ENCOUNTER — Ambulatory Visit (HOSPITAL_COMMUNITY): Payer: Medicare Other

## 2021-05-22 ENCOUNTER — Ambulatory Visit: Payer: Medicare Other | Admitting: Adult Health

## 2021-05-22 ENCOUNTER — Encounter: Payer: Self-pay | Admitting: Adult Health

## 2021-05-22 ENCOUNTER — Encounter (HOSPITAL_COMMUNITY): Payer: Medicare Other

## 2021-05-22 VITALS — BP 139/96 | HR 65

## 2021-05-22 DIAGNOSIS — M7989 Other specified soft tissue disorders: Secondary | ICD-10-CM | POA: Diagnosis not present

## 2021-05-22 DIAGNOSIS — I639 Cerebral infarction, unspecified: Secondary | ICD-10-CM | POA: Diagnosis not present

## 2021-05-22 DIAGNOSIS — G8191 Hemiplegia, unspecified affecting right dominant side: Secondary | ICD-10-CM | POA: Diagnosis not present

## 2021-05-22 NOTE — Progress Notes (Signed)
?[facility] ?[address] ?[place] [address] ?[phone] ? ?     STROKE FOLLOW UP NOTE ? ?[name] ?Date of Birth:  08/19/1965 ?Medical Record Number:  [id number]  ? ?Reason for Referral: stroke follow up ? ? ? ?SUBJECTIVE: ? ? ?CHIEF COMPLAINT:  ?Chief Complaint  ?Patient presents with  ? Follow-up  ?  RM 2 with spouse Craig Knight ?Pt is well, has seen some improvement with movement. No new concerns   ? ? ?HPI:  ? ?Update 05/22/2021 JM: Patient returns for stroke follow-up accompanied by his wife.  Previously seen 7 months ago.  Reports continued right arm weakness but improvement of right leg weakness. He is able to ambulate short distance with RW but over the past month, has been having worsening LE swelling which has limited his overall ambulation. Was seen by PCP a couple weeks ago and started on lasix but no benefit, occasional use of compression stockings. Continued visual impairment with some improvement since prior visit.  He was recently working with therapy for right shoulder issues and continued gait impairment but due to lack of progress, he was recently discharged.  Continues to need assistance for some ADLs in which wife assists with. Denies new stroke/TIA symptoms.  Remains on aspirin, Plavix and atorvastatin, denies side effects.  Blood pressure today 139/96.  Monitors at home which has been stable. C/o insomnia - has not been using CPAP recently. No further concerns at this time.  ? ? ? ?History provided for reference purposes only ?Initial visit 10/16/2020 JM: [name] is being seen for hospital follow-up accompanied by his wife.  He was recently discharged home from [place] on 8/19. He plans on starting PT/OT/SLP at [facility] on 8/29.  Reports residual left visual loss and right sided weakness.  He is not ambulatory - able to stand/pivot for transfers into wheelchair.  Denies residual swallowing difficulties.  He is overly frustrated with continued deficits  reporting "I want to be better right this minute!".  He is currently residing with his wife who assists with majority of ADLs and all IADLs.  Denies new stroke/TIA symptoms.  Completed 30 days of aspirin and Brilinta during CIR admission and remains on aspirin and Plavix as well as atorvastatin without side effects.  Blood pressure today 116/82.  Cardiac monitor completed prior to leaving rehab - results not yet available. Use of CPAP nightly for sleep apnea management.  No further concerns at this time. ? ?Stroke admission 09/06/2020 ?Craig Knight is a 55 y.o. male with history of DM2, COPD, ongoing tobacco use, HTN, OSA, recent large R PCA stroke and left pontine stroke 09/06/2020 with residual L hemianopsia and mild right sided weakness and right facial droop with unclear stroke etiology. TEE and loop recommended however due to weekend, plan was to f/u outpatient for completion.  Returns to [facility] ED on 09/15/2020 with shortness of breath and worsening R sided weakness.  Personally reviewed hospitalization pertinent progress notes, lab work and imaging.  Transferred to [facility] ED for further stroke evaluation.  Evaluated by Dr. Erlinda Hong for L CR stroke secondary to unclear source.  MRA head moderate to severe occlusion right PCA and severe stenosis left PCA. CTA head/neck unchanged left PCA occlusion and intracranial stenosis with severe proximal left PCA and left P3 stenosis.  CT perfusion no new perfusion deficit.  TEE normal EF and negative PFO.  Refused loop recorder placement therefore Zio patch recommended.  On aspirin and Brilinta PTA  from recent stroke admission and recommended continuation for 30 days then resume aspirin and Plavix.  A1c 7.4.  LDL 67 on atorvastatin 80 mg daily.  Other stroke risk factors include recent tobacco use, EtOH use, obesity, prior strokes on aging, family history of stroke and OSA.  Therapy evaluations for residual right hemiplegia and left hemianopia -recommended  CIR. ? ? ? ? ? ? ? ?Pertinent imaging ? ?09/15/2020 admission ?CT head - Evolving right PCA infarct without interval hemorrhage. Evolving left pontine infarct without interval hemorrhage. No definite new acute intracranial abnormality since prior study from 09/06/2020. Multiple chronic infarcts involving left frontal lobe, bilateral white matter, basal ganglia and thalamus. ?MRI head 09/15/20 - Acute left CR infarct. Evolving subacute posterior circulation infarcts without extension from the 09/07/2020 MRI. Multiple old infarcts. ?MRI Head 09/17/20 - Unchanged acute and subacute infarcts involving the infra and supratentorial brain. ?MRA head - Moderate to severe Occlusion right PCA. Severe stenosis left PCA with distal flow. ?CTA H&N - unchanged left PCA occlusion, underwent intracranial stenosis with severe proximal left PCA and left A3 stenosis ?CT Perfusion no new perfusion deficit ?TEE with normal EF and negative PFO ? ?09/06/2020 admission ?CT subacute right PCA stroke ?CTA head and neck right P1/P2 occlusion, bilateral M3 mild to moderate stenosis.  Right A3 moderate stenosis, left VA and left P2 moderate stenosis ?MRI right PCA large infarct with left pontine infarct ?2D Echo EF 60 to 65% ? ? ?ROS:   ?14 system review of systems performed and negative with exception of those listed in HPI ? ?PMH:  ?Past Medical History:  ?Diagnosis Date  ? Asthma   ? Bronchitis   ? COPD (chronic obstructive pulmonary disease) ([place])   ? Diabetes mellitus without complication ([place])   ? Heart murmur 08/24/2020  ? soft systolic murmur 1/6  ? Hypertension   ? Sleep apnea   ? Stroke [facility])   ? ? ?PSH:  ?Past Surgical History:  ?Procedure Laterality Date  ? BUBBLE STUDY  09/18/2020  ? Procedure: BUBBLE STUDY;  Surgeon: Pixie Casino, MD;  Location: [facility] ENDOSCOPY;  Service: Cardiovascular;;  ? COLONOSCOPY N/A 09/18/2016  ? Procedure: COLONOSCOPY;  Surgeon: Danie Binder, MD;  Location: [facility] ENDO SUITE;  Service: Endoscopy;  Laterality:  N/A;  10:30 AM  ? lipoma removal    ? TEE WITHOUT CARDIOVERSION N/A 09/18/2020  ? Procedure: TRANSESOPHAGEAL ECHOCARDIOGRAM (TEE);  Surgeon: Pixie Casino, MD;  Location: [facility];  Service: Cardiovascular;  Laterality: N/A;  ? ? ?Social History:  ?Social History  ? ?Socioeconomic History  ? Marital status: Married  ?  Spouse name: Not on file  ? Number of children: Not on file  ? Years of education: Not on file  ? Highest education level: Not on file  ?Occupational History  ? Not on file  ?Tobacco Use  ? Smoking status: Every Day  ?  Packs/day: 0.25  ?  Types: Cigarettes  ? Smokeless tobacco: Never  ? Tobacco comments:  ?  Quitting per patient  ?Vaping Use  ? Vaping Use: Never used  ?Substance and Sexual Activity  ? Alcohol use: Yes  ?  Alcohol/week: 0.0 standard drinks  ?  Comment: occasionally  ? Drug use: No  ? Sexual activity: Yes  ?  Birth control/protection: None  ?Other Topics Concern  ? Not on file  ?Social History Narrative  ? Not on file  ? ?Social Determinants of Health  ? ?Financial Resource Strain: Not on file  ?Food  Insecurity: Not on file  ?Transportation Needs: Not on file  ?Physical Activity: Not on file  ?Stress: Not on file  ?Social Connections: Not on file  ?Intimate Partner Violence: Not on file  ? ? ?Family History:  ?Family History  ?Problem Relation Age of Onset  ? Stroke Mother   ? Heart attack Father 88  ?     CABG  ? Hypertension Sister   ? Stroke Brother   ? Hypertension Brother   ? Stomach cancer Brother   ? Stroke Maternal Grandmother   ? Heart attack Maternal Grandfather   ? Heart attack Paternal Grandmother   ? ? ?Medications:   ?Current Outpatient Medications on File Prior to Visit  ?Medication Sig Dispense Refill  ? acetaminophen (TYLENOL) 325 MG tablet Take 2 tablets (650 mg total) by mouth every 4 (four) hours as needed for mild pain (or temp > 37.5 C (99.5 F)).    ? albuterol (VENTOLIN HFA) 108 (90 Base) MCG/ACT inhaler Inhale 2 puffs into the lungs every 6 (six) hours as  needed for wheezing or shortness of breath. 18 g 8  ? aspirin EC 81 MG tablet Take 1 tablet (81 mg total) by mouth daily. 30 tablet 0  ? atorvastatin (LIPITOR) 80 MG tablet Take 1 tablet (80 mg total) by mouth daily. 90 tab

## 2021-05-22 NOTE — Patient Instructions (Addendum)
Continue aspirin 81 mg daily and clopidogrel 75 mg daily  and atorvastatin  for secondary stroke prevention ? ?Look for compression stockings on [place] or at [facility]  ? ?You will be called to schedule an ultrasound of your legs to ensure no clots  ? ?Ensure nightly use of CPAP  ? ?Continue to follow up with PCP regarding cholesterol and blood pressure management  ?Maintain strict control of hypertension with blood pressure goal below 130/90 and cholesterol with LDL cholesterol (bad cholesterol) goal below 70 mg/dL.  ? ?Signs of a Stroke? Follow the BEFAST method:  ?Balance Watch for a sudden loss of balance, trouble with coordination or vertigo ?Eyes Is there a sudden loss of vision in one or both eyes? Or double vision?  ?Face: Ask the person to smile. Does one side of the face droop or is it numb?  ?Arms: Ask the person to raise both arms. Does one arm drift downward? Is there weakness or numbness of a leg? ?Speech: Ask the person to repeat a simple phrase. Does the speech sound slurred/strange? Is the person confused ? ?Time: If you observe any of these signs, call 911. ? ? ? ? ? ? ? ?Thank you for coming to see [place] at [facility]. I hope we have been able to provide you high quality care today. ? ?You may receive a patient satisfaction survey over the next few weeks. We would appreciate your feedback and comments so that we may continue to improve ourselves and the health of our patients. ? ?

## 2021-05-24 ENCOUNTER — Ambulatory Visit (HOSPITAL_COMMUNITY): Payer: Medicare Other | Admitting: Physical Therapy

## 2021-05-24 DIAGNOSIS — D638 Anemia in other chronic diseases classified elsewhere: Secondary | ICD-10-CM | POA: Diagnosis not present

## 2021-05-24 DIAGNOSIS — N189 Chronic kidney disease, unspecified: Secondary | ICD-10-CM | POA: Diagnosis not present

## 2021-05-24 DIAGNOSIS — I639 Cerebral infarction, unspecified: Secondary | ICD-10-CM | POA: Diagnosis not present

## 2021-05-24 DIAGNOSIS — E1122 Type 2 diabetes mellitus with diabetic chronic kidney disease: Secondary | ICD-10-CM | POA: Diagnosis not present

## 2021-05-24 DIAGNOSIS — I129 Hypertensive chronic kidney disease with stage 1 through stage 4 chronic kidney disease, or unspecified chronic kidney disease: Secondary | ICD-10-CM | POA: Diagnosis not present

## 2021-05-24 DIAGNOSIS — Z7189 Other specified counseling: Secondary | ICD-10-CM | POA: Diagnosis not present

## 2021-05-24 DIAGNOSIS — I5033 Acute on chronic diastolic (congestive) heart failure: Secondary | ICD-10-CM | POA: Diagnosis not present

## 2021-05-29 ENCOUNTER — Ambulatory Visit (HOSPITAL_COMMUNITY): Payer: Medicare Other | Admitting: Physical Therapy

## 2021-05-31 ENCOUNTER — Ambulatory Visit (HOSPITAL_COMMUNITY): Payer: Medicare Other | Admitting: Physical Therapy

## 2021-06-01 DIAGNOSIS — E119 Type 2 diabetes mellitus without complications: Secondary | ICD-10-CM | POA: Diagnosis not present

## 2021-06-01 DIAGNOSIS — I10 Essential (primary) hypertension: Secondary | ICD-10-CM | POA: Diagnosis not present

## 2021-06-05 ENCOUNTER — Ambulatory Visit (HOSPITAL_COMMUNITY): Payer: Medicare Other

## 2021-06-07 ENCOUNTER — Other Ambulatory Visit: Payer: Self-pay | Admitting: Adult Health

## 2021-06-07 ENCOUNTER — Ambulatory Visit (HOSPITAL_COMMUNITY): Payer: Medicare Other | Admitting: Physical Therapy

## 2021-06-07 ENCOUNTER — Telehealth: Payer: Self-pay | Admitting: Adult Health

## 2021-06-07 DIAGNOSIS — M7989 Other specified soft tissue disorders: Secondary | ICD-10-CM

## 2021-06-07 NOTE — Telephone Encounter (Signed)
Pt wife called following up about scheduling an ultrasound. Would like a call back.  ?[phone] ?

## 2021-06-07 NOTE — Telephone Encounter (Signed)
Contacted pt wife back, informed her [facility] would be doing the ultrasound and should contact them for an update. Number provided and she was appreciative.  ?

## 2021-06-07 NOTE — Telephone Encounter (Signed)
Sent Cathy with [facility] a message to get pt scheduled for US ARTERIAL LOWER EXTREMITY DUPLEX BILATERAL. ? ?

## 2021-06-08 ENCOUNTER — Ambulatory Visit
Admission: RE | Admit: 2021-06-08 | Discharge: 2021-06-08 | Disposition: A | Discharge status: Home / Self Care | Payer: Medicare Other | Source: Ambulatory Visit | Attending: Adult Health | Admitting: Adult Health

## 2021-06-08 ENCOUNTER — Inpatient Hospital Stay: Admission: RE | Admit: 2021-06-08 | Payer: Medicare Other | Source: Ambulatory Visit

## 2021-06-08 DIAGNOSIS — M79604 Pain in right leg: Secondary | ICD-10-CM | POA: Diagnosis not present

## 2021-06-08 DIAGNOSIS — M7989 Other specified soft tissue disorders: Secondary | ICD-10-CM

## 2021-06-08 DIAGNOSIS — R600 Localized edema: Secondary | ICD-10-CM | POA: Diagnosis not present

## 2021-06-08 DIAGNOSIS — M79605 Pain in left leg: Secondary | ICD-10-CM | POA: Diagnosis not present

## 2021-06-08 IMAGING — US US EXTREM LOW VENOUS
1 series · 13 of 24 positions shown · non-contrast
Comparison: None.

CLINICAL DATA: Bilateral lower extremity pain and edema. Evaluate
for DVT.



[Series 1: us extrem low venous · 0.08mm/px · 13 of 48 slices shown]
[im 1/48]
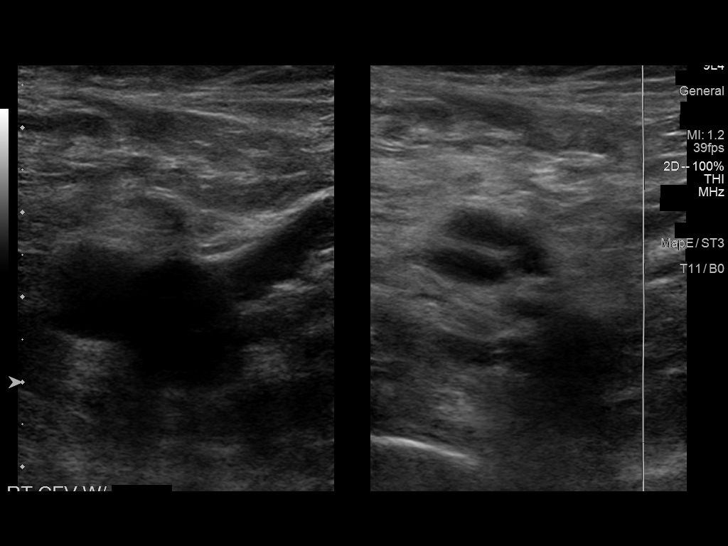
[im 5/48]
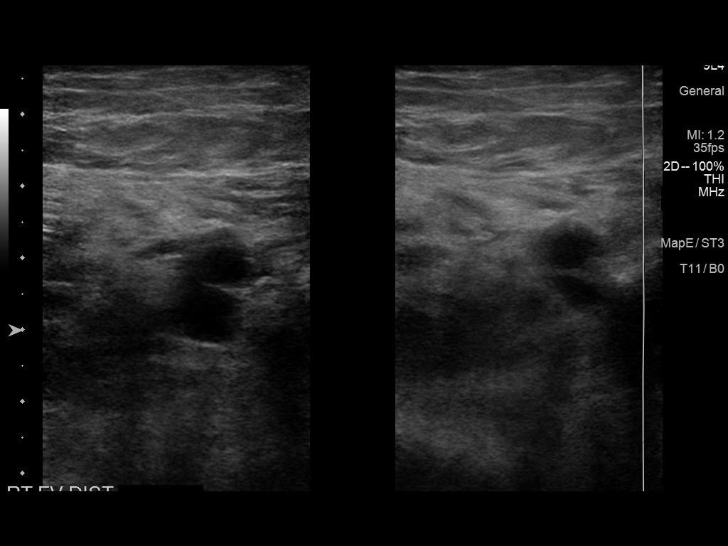
[im 9/48]
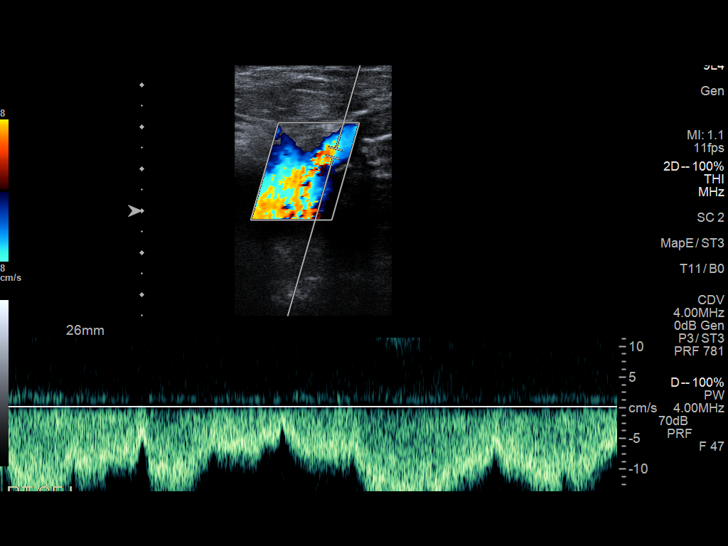
[im 13/48]
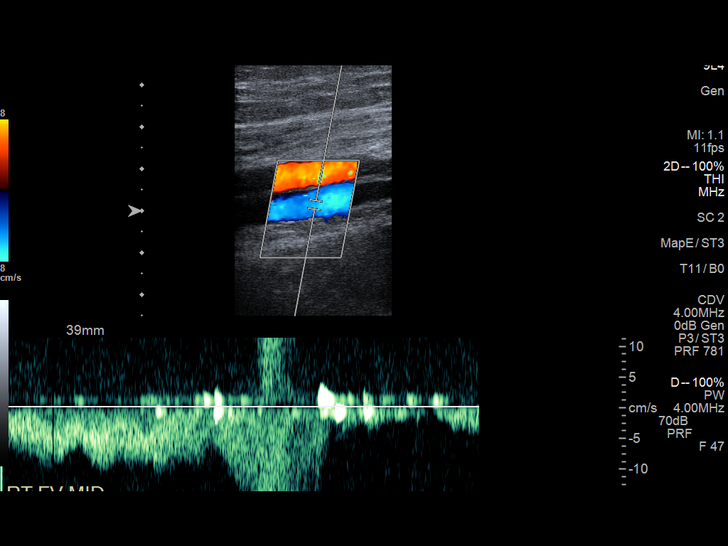
[im 17/48]
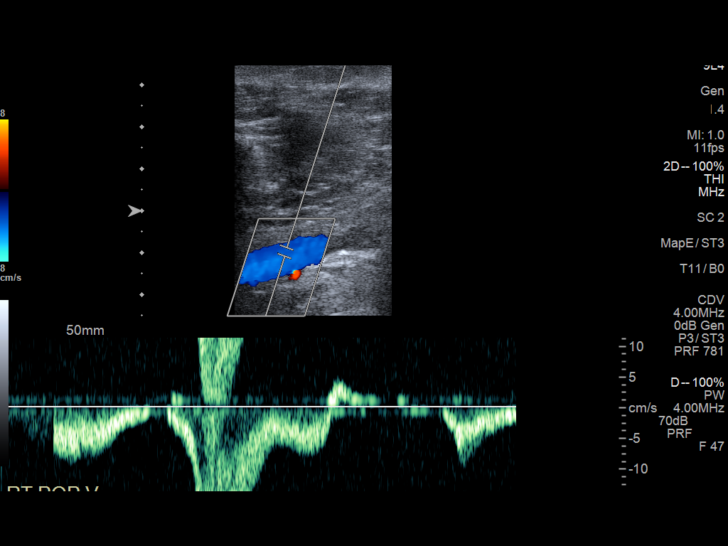
[im 21/48]
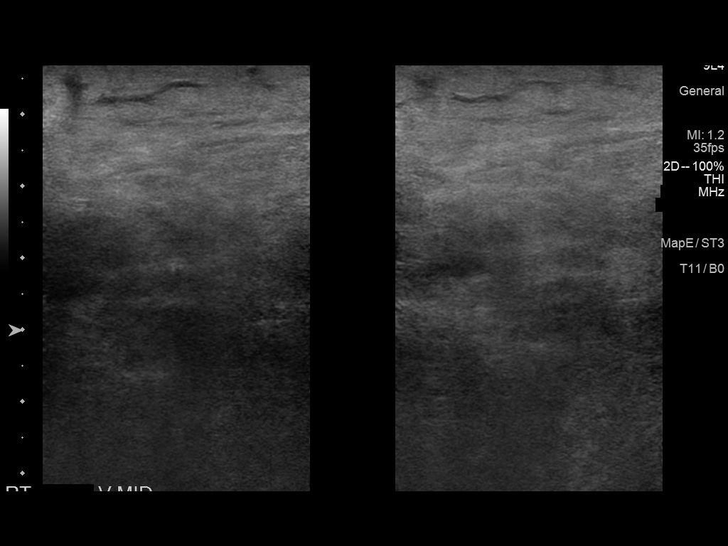
[im 25/48]
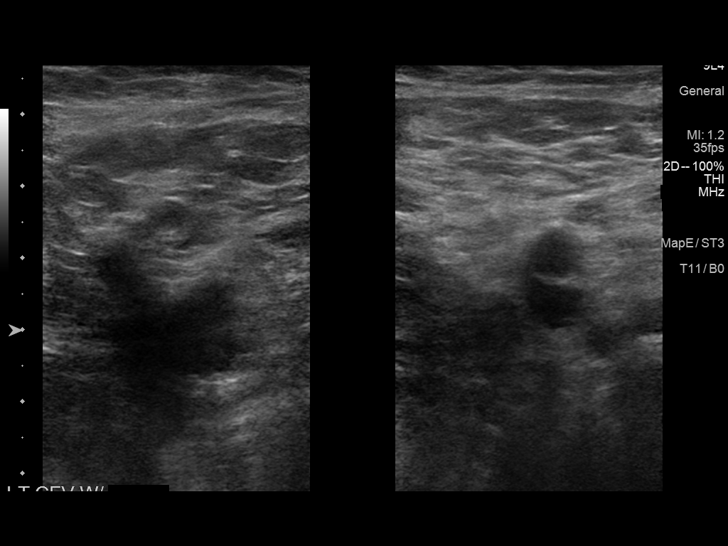
[im 27/48]
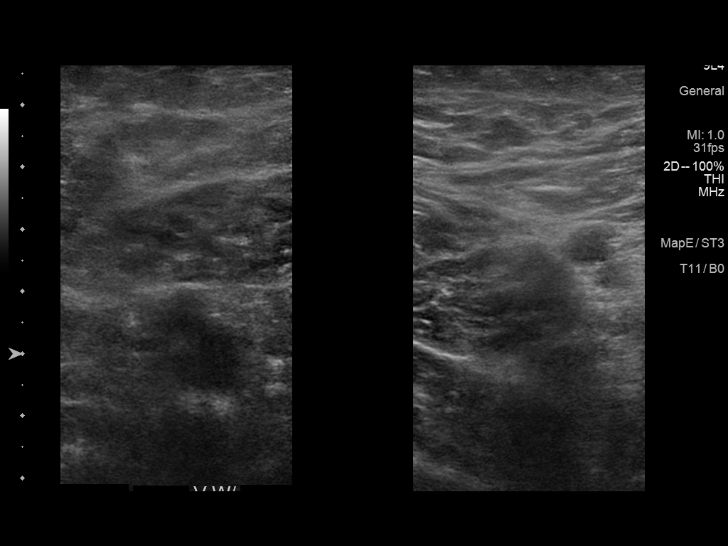
[im 31/48]
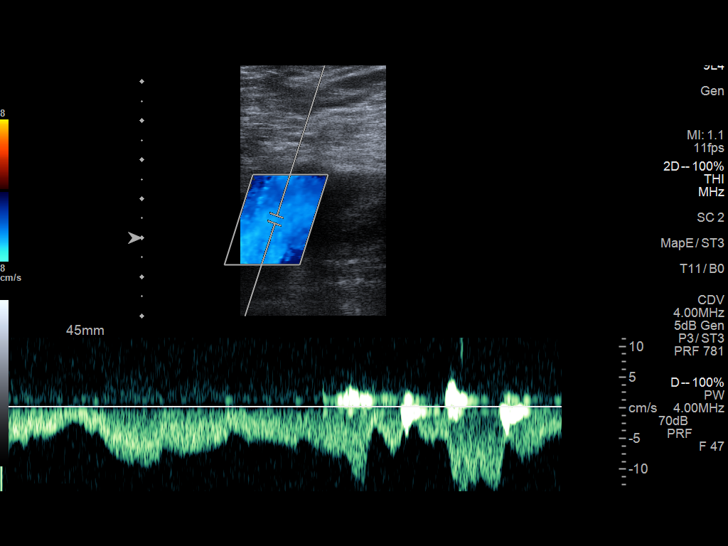
[im 35/48]
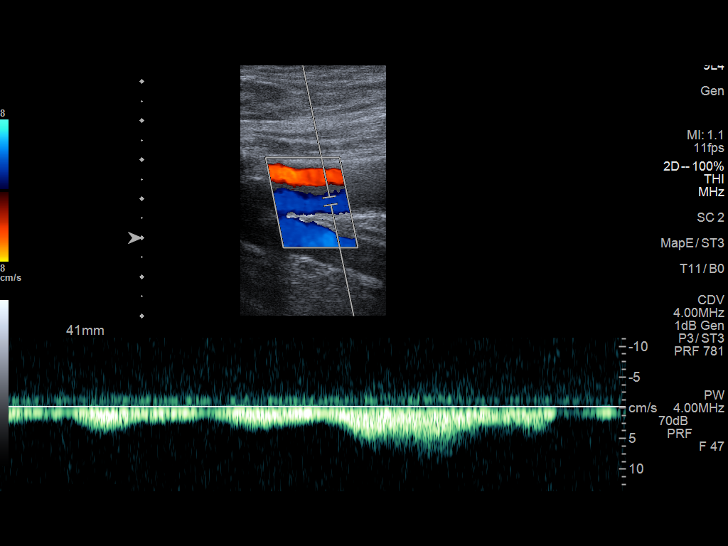
[im 39/48]
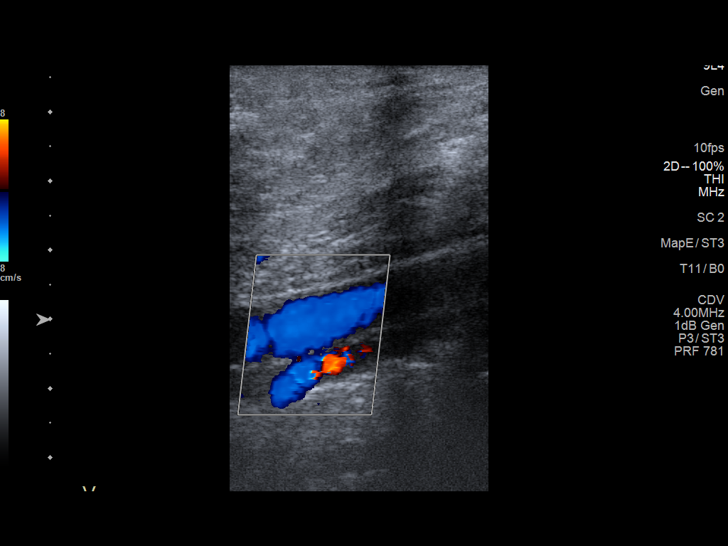
[im 43/48]
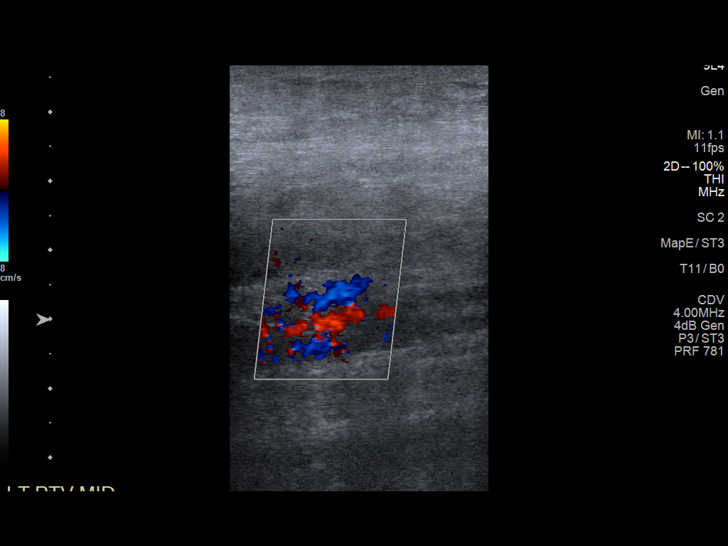
[im 48/48]
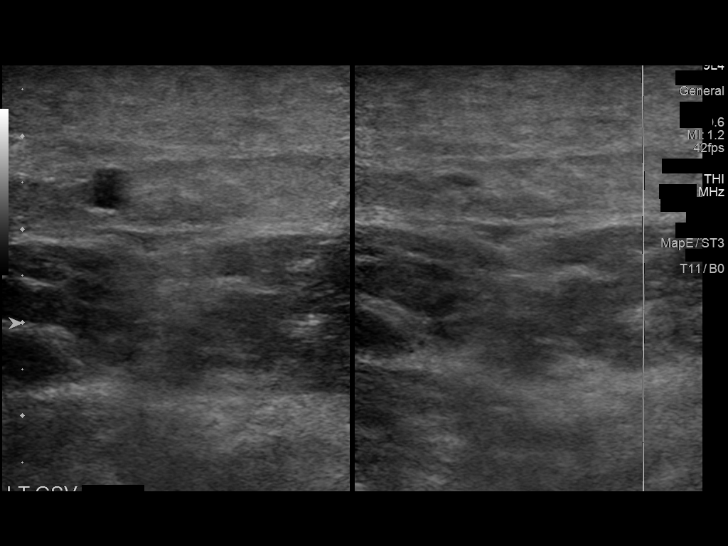

[13 of 24 positions shown; findings below may reference images not displayed]

FINDINGS: RIGHT LOWER EXTREMITY

Common Femoral Vein: No evidence of thrombus. Normal
compressibility, respiratory phasicity and response to augmentation.

Saphenofemoral Junction: No evidence of thrombus. Normal
compressibility and flow on color Doppler imaging.

Profunda Femoral Vein: No evidence of thrombus. Normal
compressibility and flow on color Doppler imaging.

Femoral Vein: No evidence of thrombus. Normal compressibility,
respiratory phasicity and response to augmentation.

Popliteal Vein: No evidence of thrombus. Normal compressibility,
respiratory phasicity and response to augmentation.

Calf Veins: Not visualized

Superficial Great Saphenous Vein: No evidence of thrombus. Normal
compressibility.

Other Findings: Note is made of a mildly prominent though non
pathologically enlarged and benign-appearing right inguinal lymph
node which measures 1.1 cm in greatest short axis diameter and
maintains a benign fatty hilum (image 7). There is a minimal amount
of subcutaneous edema at the level of the right calf.

LEFT LOWER EXTREMITY

Common Femoral Vein: No evidence of thrombus. Normal
compressibility, respiratory phasicity and response to augmentation.

Saphenofemoral Junction: No evidence of thrombus. Normal
compressibility and flow on color Doppler imaging.

Profunda Femoral Vein: No evidence of thrombus. Normal
compressibility and flow on color Doppler imaging.

Femoral Vein: No evidence of thrombus. Normal compressibility,
respiratory phasicity and response to augmentation.

Popliteal Vein: No evidence of thrombus. Normal compressibility,
respiratory phasicity and response to augmentation.

Calf Veins: Not visualized

Superficial Great Saphenous Vein: No evidence of thrombus. Normal
compressibility.

Other Findings:  None.
IMPRESSION: No evidence of DVT within either lower extremity.

## 2021-06-11 NOTE — Telephone Encounter (Signed)
Contacted pt, informed him that lower extremity ultrasound did not show any evidence of clots in his lower extremities. Advised to call office back with questions as he had none at this time and was appreciative.  ?

## 2021-06-14 DIAGNOSIS — I639 Cerebral infarction, unspecified: Secondary | ICD-10-CM | POA: Diagnosis not present

## 2021-06-14 DIAGNOSIS — R609 Edema, unspecified: Secondary | ICD-10-CM | POA: Diagnosis not present

## 2021-06-14 DIAGNOSIS — G8191 Hemiplegia, unspecified affecting right dominant side: Secondary | ICD-10-CM | POA: Diagnosis not present

## 2021-06-15 DIAGNOSIS — G8190 Hemiplegia, unspecified affecting unspecified side: Secondary | ICD-10-CM | POA: Diagnosis not present

## 2021-06-15 DIAGNOSIS — I10 Essential (primary) hypertension: Secondary | ICD-10-CM | POA: Diagnosis not present

## 2021-06-15 DIAGNOSIS — E1165 Type 2 diabetes mellitus with hyperglycemia: Secondary | ICD-10-CM | POA: Diagnosis not present

## 2021-06-15 DIAGNOSIS — R600 Localized edema: Secondary | ICD-10-CM | POA: Diagnosis not present

## 2021-06-15 DIAGNOSIS — N1831 Chronic kidney disease, stage 3a: Secondary | ICD-10-CM | POA: Diagnosis not present

## 2021-06-26 ENCOUNTER — Encounter: Payer: Self-pay | Admitting: Physical Medicine & Rehabilitation

## 2021-06-26 ENCOUNTER — Encounter: Payer: Medicare Other | Attending: Registered Nurse | Admitting: Physical Medicine & Rehabilitation

## 2021-06-26 VITALS — BP 131/82 | HR 80 | Ht 68.0 in

## 2021-06-26 DIAGNOSIS — Z79899 Other long term (current) drug therapy: Secondary | ICD-10-CM | POA: Diagnosis not present

## 2021-06-26 DIAGNOSIS — G8111 Spastic hemiplegia affecting right dominant side: Secondary | ICD-10-CM | POA: Insufficient documentation

## 2021-06-26 DIAGNOSIS — E1122 Type 2 diabetes mellitus with diabetic chronic kidney disease: Secondary | ICD-10-CM | POA: Diagnosis not present

## 2021-06-26 DIAGNOSIS — I639 Cerebral infarction, unspecified: Secondary | ICD-10-CM | POA: Diagnosis not present

## 2021-06-26 DIAGNOSIS — I5033 Acute on chronic diastolic (congestive) heart failure: Secondary | ICD-10-CM | POA: Diagnosis not present

## 2021-06-26 DIAGNOSIS — N189 Chronic kidney disease, unspecified: Secondary | ICD-10-CM | POA: Diagnosis not present

## 2021-06-26 DIAGNOSIS — I129 Hypertensive chronic kidney disease with stage 1 through stage 4 chronic kidney disease, or unspecified chronic kidney disease: Secondary | ICD-10-CM | POA: Diagnosis not present

## 2021-06-26 NOTE — Patient Instructions (Signed)
? ?  Please place support stocking each morning  ?

## 2021-06-26 NOTE — Progress Notes (Signed)
? ?Subjective:  ? ? Patient ID: Craig Knight, male    DOB: [birth year], 56 y.o.   MRN: [id number] ?56 y.o. male with history of hypertension, obesity, COPD with tobacco abuse diabetes mellitus diastolic congestive heart failure CKD stage III with recent admission 09/06/2020 to 09/08/2020 for right PCA territory infarction with residual mild right side weakness.  Maintained on aspirin as well as Brilinta x30 days then aspirin 81 mg daily and Plavix 75 mg daily.  He was discharged to home minimal guard.  Patient lives with spouse and daughter.  Presented 09/15/2020 to [facility] nonspecific shortness of breath worsening right side weakness.  MRI of the brain showed acute left basal ganglia infarction.  Evolving subacute posterior circulation infarct without extension from 09/07/2020.  Moderate chronic small vessel ischemic changes.  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  There appeared to possibly have progressed since prior studies however difficult to compare given the amount of motion on the MRA.  CTA head neck no emergent large vessel occlusion.  Incidental findings of thyroid nodule follow-up outpatient.  Echocardiogram with ejection fraction of 60 to 65% no wall motion abnormalities.  TEE without PFO.  Admission chemistries unremarkable except BUN 33 creatinine 2.01 hemoglobin A1c 7.4.  Maintained on aspirin 81 mg daily and Brilinta 90 mg twice daily for CVA prophylaxis x30 days and back to aspirin and Plavix.  Subcutaneous Lovenox for DVT prophylaxis. ?Admit date: 09/19/2020 ?Discharge date: 10/13/2020 ?HPI ? ?56 year old male with history of recurrent strokes with chronic right hemiplegia.  He was reevaluated by PT OT and therapy was continued for approximately 1 month but did not make any progress therefore therapy was discontinued. ?The patient does complain of his right leg kicking at night and sometimes during the day also ankle movements as well.  He  demonstrates knee extensor spasms using his intact leg. ?In addition he has complaints of bilateral lower extremity swelling.  He underwent recent vascular ultrasound which is normal.  We reviewed his last echocardiogram which showed a normal ejection fraction. ? ?The patient has been prescribed Lasix by his primary care physician in addition to chlorthalidone ?Pain Inventory ?Average Pain 6 ?Pain Right Now 0 ?My pain is constant ? ?LOCATION OF PAIN  left & right leg ? ?BOWEL ?Number of stools per week: 7 ? ?BLADDER ?Normal ? ?Mobility ?ability to climb steps?  no ?do you drive?  no ?use a wheelchair ?transfers alone ?Do you have any goals in this area?  yes ? ?Function ?disabled: date disabled Maybe 2-3 years ago ?I need assistance with the following:  dressing, bathing, meal prep, household duties, and shopping ?Do you have any goals in this area?  yes ? ?Neuro/Psych ?weakness ?numbness ?tingling ?trouble walking ?spasms ?depression ? ?Prior Studies ?Any changes since last visit?  no ? ?Physicians involved in your care ?Any changes since last visit?  no ? ? ?Family History  ?Problem Relation Age of Onset  ? Stroke Mother   ? Heart attack Father 66  ?     CABG  ? Hypertension Sister   ? Stroke Brother   ? Hypertension Brother   ? Stomach cancer Brother   ? Stroke Maternal Grandmother   ? Heart attack Maternal Grandfather   ? Heart attack Paternal Grandmother   ? ?Social History  ? ?Socioeconomic History  ? Marital status: Married  ?  Spouse name: Not on file  ? Number of children: Not on file  ? Years  of education: Not on file  ? Highest education level: Not on file  ?Occupational History  ? Not on file  ?Tobacco Use  ? Smoking status: Every Day  ?  Packs/day: 0.25  ?  Types: Cigarettes  ? Smokeless tobacco: Never  ? Tobacco comments:  ?  Quitting per patient  ?Vaping Use  ? Vaping Use: Never used  ?Substance and Sexual Activity  ? Alcohol use: Yes  ?  Alcohol/week: 0.0 standard drinks  ?  Comment: occasionally  ?  Drug use: No  ? Sexual activity: Yes  ?  Birth control/protection: None  ?Other Topics Concern  ? Not on file  ?Social History Narrative  ? Not on file  ? ?Social Determinants of Health  ? ?Financial Resource Strain: Not on file  ?Food Insecurity: Not on file  ?Transportation Needs: Not on file  ?Physical Activity: Not on file  ?Stress: Not on file  ?Social Connections: Not on file  ? ?Past Surgical History:  ?Procedure Laterality Date  ? BUBBLE STUDY  09/18/2020  ? Procedure: BUBBLE STUDY;  Surgeon: Pixie Casino, MD;  Location: [facility] ENDOSCOPY;  Service: Cardiovascular;;  ? COLONOSCOPY N/A 09/18/2016  ? Procedure: COLONOSCOPY;  Surgeon: Danie Binder, MD;  Location: [facility] ENDO SUITE;  Service: Endoscopy;  Laterality: N/A;  10:30 AM  ? lipoma removal    ? TEE WITHOUT CARDIOVERSION N/A 09/18/2020  ? Procedure: TRANSESOPHAGEAL ECHOCARDIOGRAM (TEE);  Surgeon: Pixie Casino, MD;  Location: [place];  Service: Cardiovascular;  Laterality: N/A;  ? ?Past Medical History:  ?Diagnosis Date  ? Asthma   ? Bronchitis   ? COPD (chronic obstructive pulmonary disease) ([place])   ? Diabetes mellitus without complication ([place])   ? Heart murmur 08/24/2020  ? soft systolic murmur 1/6  ? Hypertension   ? Sleep apnea   ? Stroke [facility])   ? ?BP 131/82   Pulse 80   Ht '5\' 8"'$  (1.727 m)   SpO2 95%   BMI 31.11 kg/m?  ? ?Opioid Risk Score:   ?Fall Risk Score:  `1 ? ?Depression screen PHQ 2/9 ? ? ?  06/26/2021  ?  3:01 PM 03/29/2021  ?  2:19 PM 12/01/2020  ? 10:18 AM 07/03/2016  ?  3:16 PM 07/03/2016  ?  3:15 PM 07/03/2016  ?  2:27 PM  ?Depression screen PHQ 2/9  ?Decreased Interest 1 0 0 '2 1 2  '$ ?Down, Depressed, Hopeless 1 0 0 '2 3 3  '$ ?PHQ - 2 Score 2 0 0 '4 4 5  '$ ?Altered sleeping   0 3    ?Tired, decreased energy   1 2    ?Change in appetite   0 0    ?Feeling bad or failure about yourself    0 0    ?Trouble concentrating   0 0    ?Moving slowly or fidgety/restless   0 0    ?Suicidal thoughts   0 0    ?PHQ-9 Score   1 9    ? ? ?Review of Systems   ?Cardiovascular:  Positive for leg swelling.  ?Musculoskeletal:  Positive for gait problem. Negative for back pain.  ?     Pain both legs & feet  ?Neurological:  Positive for tremors, weakness and numbness.  ?     Spasms  ? ?   ?Objective:  ? Physical Exam ?Vitals and nursing note reviewed.  ?Constitutional:   ?   Appearance: He is obese.  ?HENT:  ?   Head:  Normocephalic and atraumatic.  ?Eyes:  ?   Extraocular Movements: Extraocular movements intact.  ?   Conjunctiva/sclera: Conjunctivae normal.  ?   Pupils: Pupils are equal, round, and reactive to light.  ?Neurological:  ?   Mental Status: He is alert. He is confused.  ?   Cranial Nerves: No dysarthria or facial asymmetry.  ?   Sensory: Sensory deficit present.  ?   Motor: Weakness and abnormal muscle tone present.  ?   Coordination: Coordination abnormal.  ?   Gait: Gait abnormal.  ?   Comments: Motor strength is 0/5 in the right deltoid, bicep, tricep, grip, hip flexion, knee extension, ankle dorsiflexion ?Left upper extremity is 5/5 ?Left lower extremity 5/5 ?Tone MAS 0 in the right elbow flexors MAS 2 at the finger flexors MAS 3 at the pronators ?Clonus at the right knee and right ankle ? ?Patient is confused about medical interventions which Dr. Is treating which condition, his wife does understand.  He requires frequent reorientation.  This is baseline since CVA in July 2022  ?Psychiatric:     ?   Mood and Affect: Mood normal.     ?   Behavior: Behavior normal.  ? ? ? ? ? ?   ?Assessment & Plan:  ?1.  Left CVA causing right spastic hemiplegia ?Clonus Right ankle , knee extension spasms at night ? ?Recommend Botox 400U ? ?Right quad 100U ?Right gastroc 200U ? ?2.  Likely venous stasis, Doppler venous ultrasound bilateral lower limbs performed on 06/08/2021 was normal ?Send note to PCP about LE swelling, may need further medication adjustment, recommend support hose to be placed every morning before he gets out of bed ?

## 2021-06-28 ENCOUNTER — Other Ambulatory Visit (HOSPITAL_COMMUNITY): Payer: Self-pay | Admitting: Nephrology

## 2021-06-28 ENCOUNTER — Other Ambulatory Visit: Payer: Self-pay | Admitting: Nephrology

## 2021-06-28 DIAGNOSIS — E1122 Type 2 diabetes mellitus with diabetic chronic kidney disease: Secondary | ICD-10-CM | POA: Diagnosis not present

## 2021-06-28 DIAGNOSIS — E1129 Type 2 diabetes mellitus with other diabetic kidney complication: Secondary | ICD-10-CM | POA: Diagnosis not present

## 2021-06-28 DIAGNOSIS — N170 Acute kidney failure with tubular necrosis: Secondary | ICD-10-CM

## 2021-06-28 DIAGNOSIS — E790 Hyperuricemia without signs of inflammatory arthritis and tophaceous disease: Secondary | ICD-10-CM | POA: Diagnosis not present

## 2021-06-28 DIAGNOSIS — D638 Anemia in other chronic diseases classified elsewhere: Secondary | ICD-10-CM

## 2021-06-28 DIAGNOSIS — N189 Chronic kidney disease, unspecified: Secondary | ICD-10-CM | POA: Diagnosis not present

## 2021-06-28 DIAGNOSIS — I5032 Chronic diastolic (congestive) heart failure: Secondary | ICD-10-CM | POA: Diagnosis not present

## 2021-06-28 DIAGNOSIS — I129 Hypertensive chronic kidney disease with stage 1 through stage 4 chronic kidney disease, or unspecified chronic kidney disease: Secondary | ICD-10-CM | POA: Diagnosis not present

## 2021-06-28 DIAGNOSIS — R809 Proteinuria, unspecified: Secondary | ICD-10-CM | POA: Diagnosis not present

## 2021-07-06 ENCOUNTER — Ambulatory Visit (HOSPITAL_COMMUNITY)
Admission: RE | Admit: 2021-07-06 | Discharge: 2021-07-06 | Disposition: A | Discharge status: Home / Self Care | Payer: Medicare Other | Source: Ambulatory Visit | Attending: Nephrology | Admitting: Nephrology

## 2021-07-06 ENCOUNTER — Telehealth: Payer: Self-pay | Admitting: Pulmonary Disease

## 2021-07-06 DIAGNOSIS — N170 Acute kidney failure with tubular necrosis: Secondary | ICD-10-CM | POA: Diagnosis not present

## 2021-07-06 DIAGNOSIS — I129 Hypertensive chronic kidney disease with stage 1 through stage 4 chronic kidney disease, or unspecified chronic kidney disease: Secondary | ICD-10-CM | POA: Insufficient documentation

## 2021-07-06 DIAGNOSIS — D638 Anemia in other chronic diseases classified elsewhere: Secondary | ICD-10-CM | POA: Diagnosis not present

## 2021-07-06 DIAGNOSIS — N179 Acute kidney failure, unspecified: Secondary | ICD-10-CM | POA: Diagnosis not present

## 2021-07-06 DIAGNOSIS — E1122 Type 2 diabetes mellitus with diabetic chronic kidney disease: Secondary | ICD-10-CM | POA: Diagnosis not present

## 2021-07-06 IMAGING — US US RENAL
1 series · 14 of 25 positions shown · non-contrast
Comparison: CT scan [DATE]

CLINICAL DATA: Chronic renal disease. Acute renal failure with
tubular necrosis.

EXAM:
RENAL / URINARY TRACT ULTRASOUND COMPLETE

[Series 1: us renal · 14 of 52 slices shown]
[im 1/52]
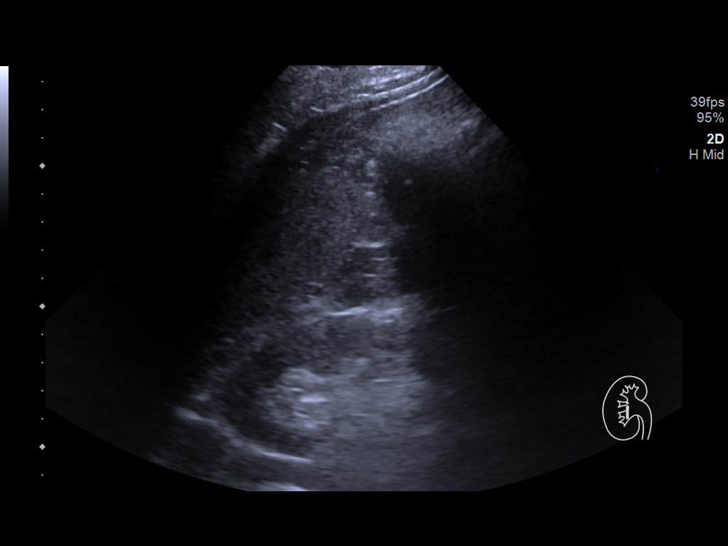
[im 5/52]
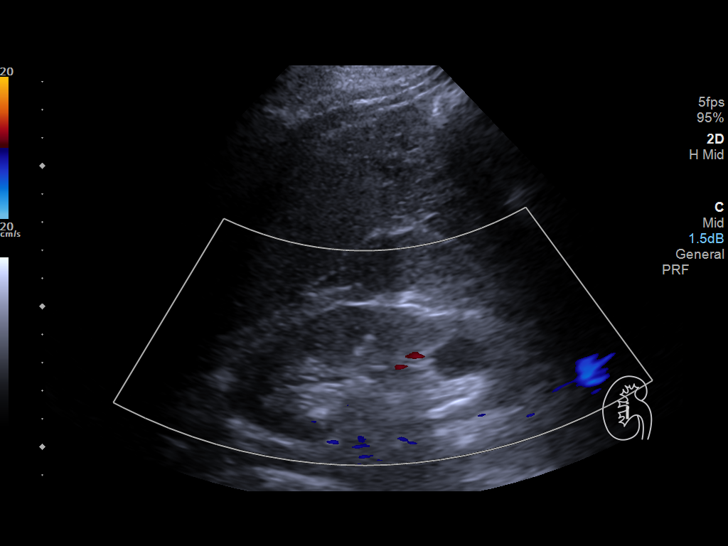
[im 9/52]
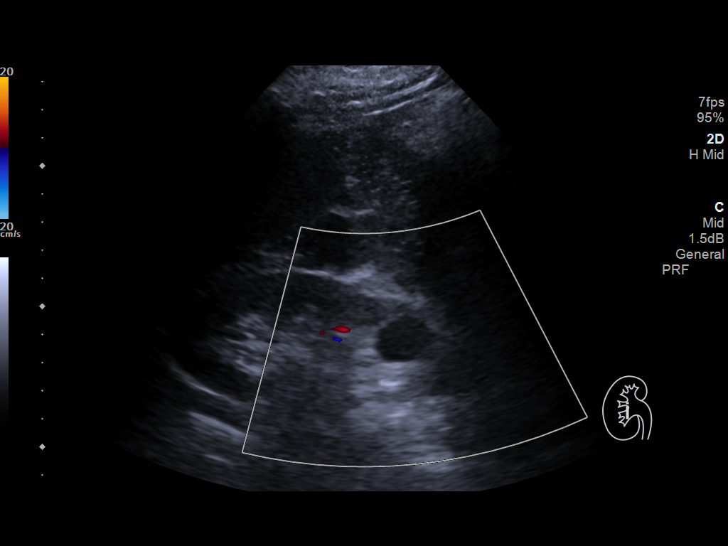
[im 13/52]
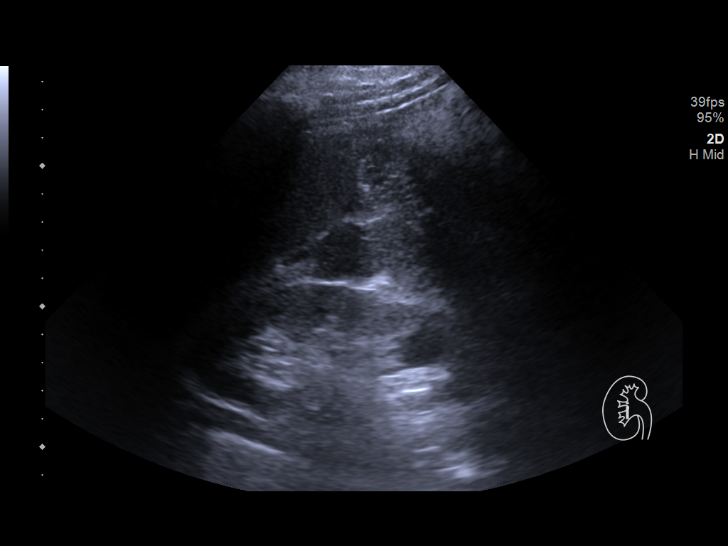
[im 18/52]
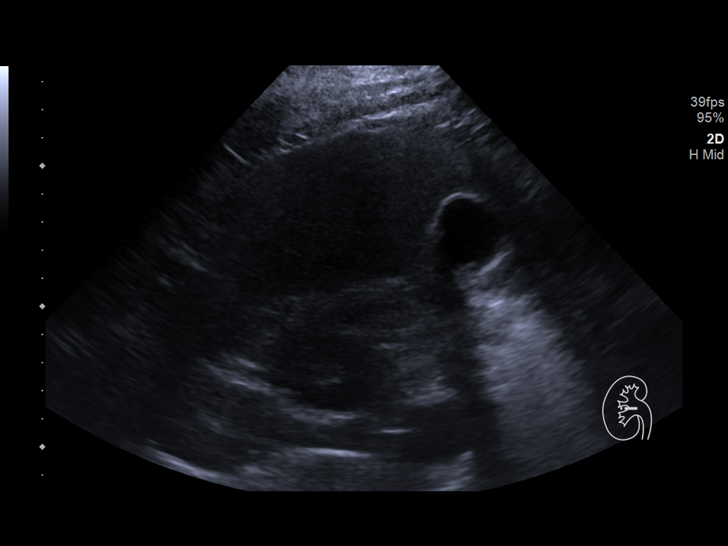
[im 20/52]
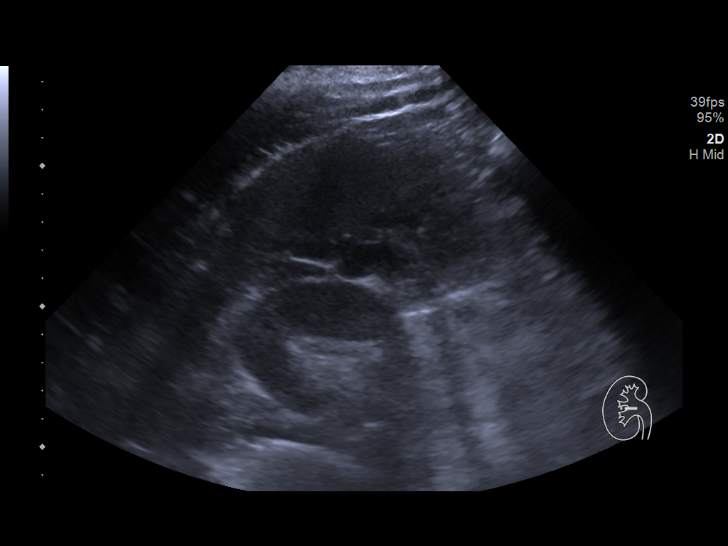
[im 24/52]
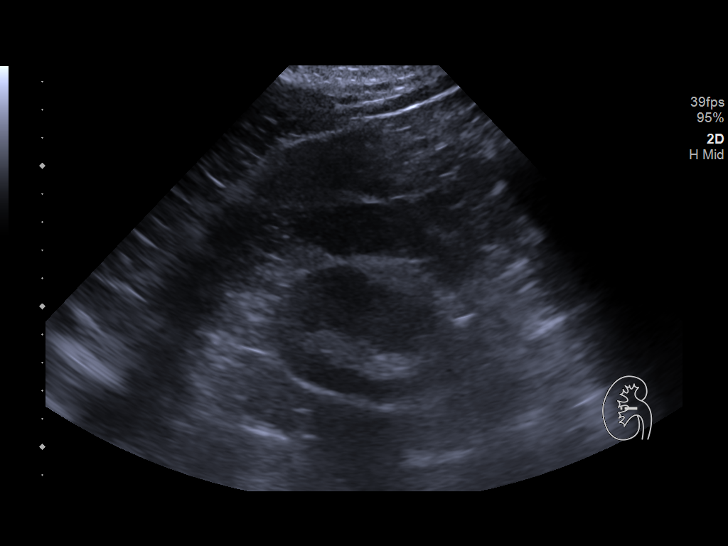
[im 28/52]
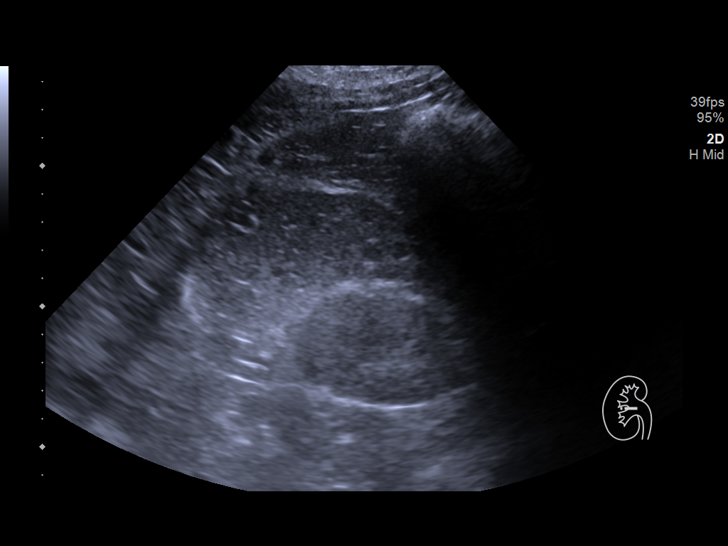
[im 32/52]
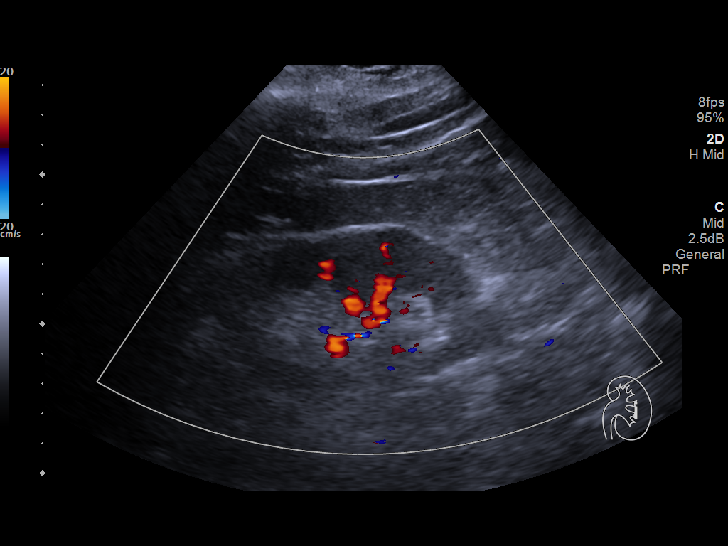
[im 35/52]
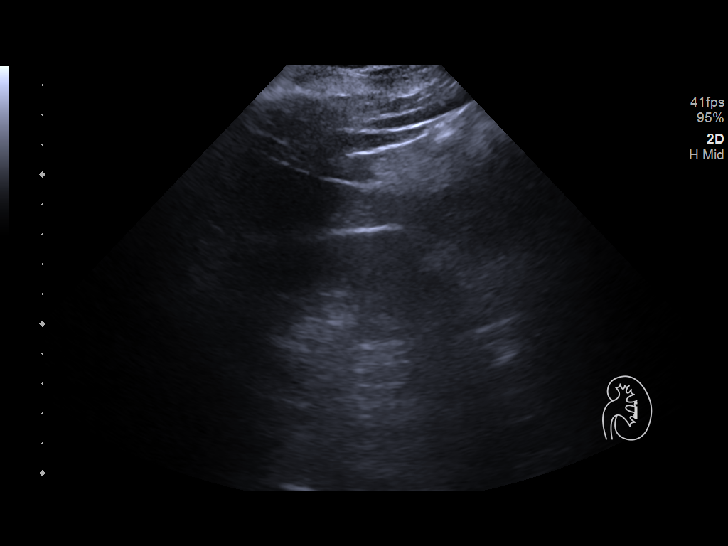
[im 39/52]
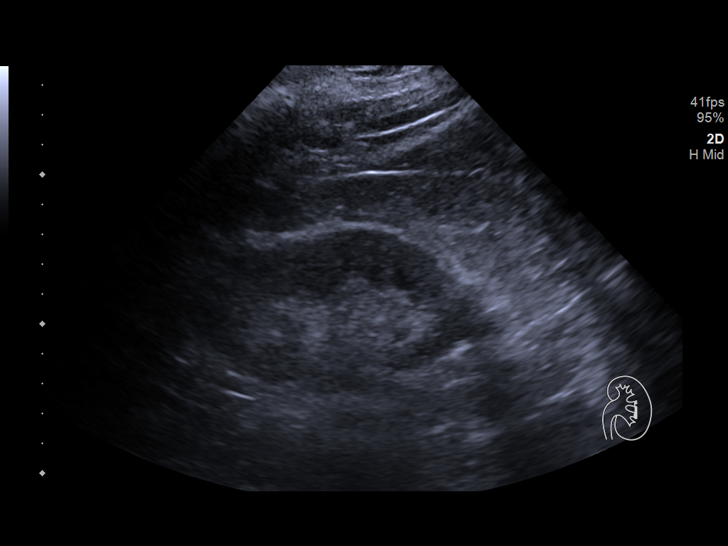
[im 43/52]
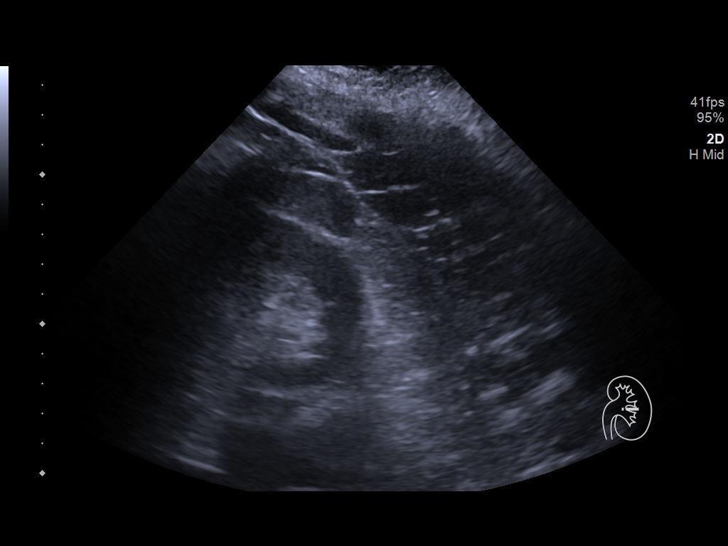
[im 47/52]
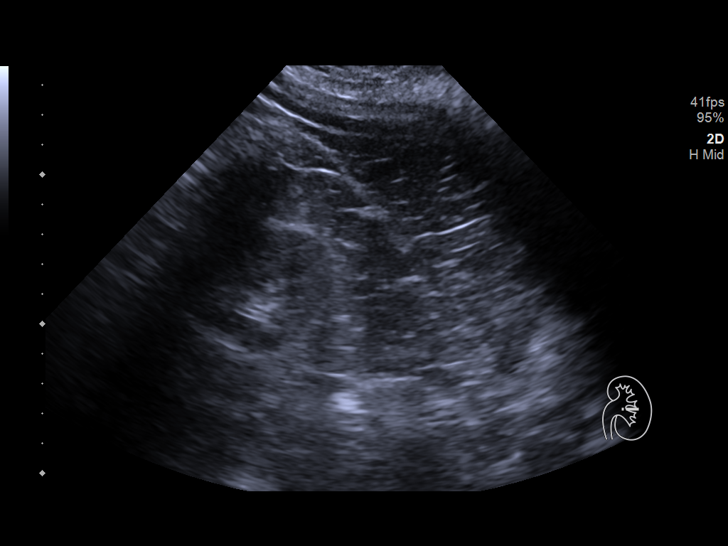
[im 52/52]
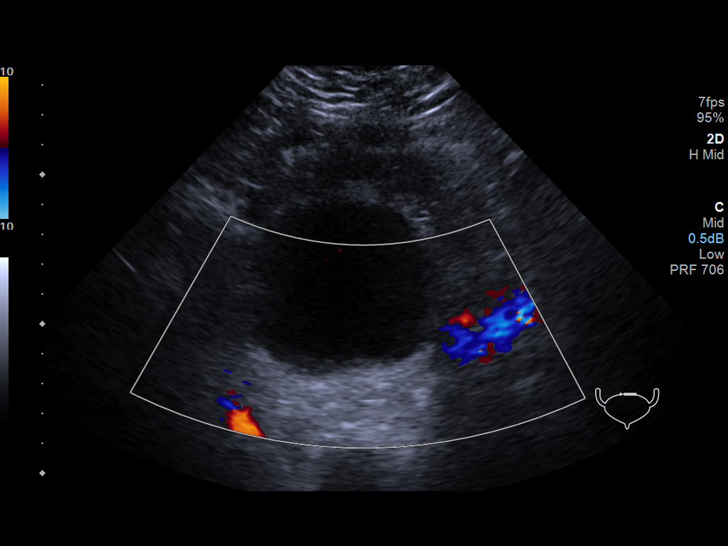

[14 of 25 positions shown; findings below may reference images not displayed]

FINDINGS: Right Kidney:

Renal measurements: 11.7 x 5.4 x 5.7 cm = volume: 190 mL. Contains a
2.1 cm cyst of no clinical significance. No follow-up imaging
recommended.

Left Kidney:

Renal measurements: 9.8 x 5.5 x 4.9 cm = volume: 138.4 mL.
Echogenicity within normal limits. No mass or hydronephrosis
visualized.

Bladder:

Appears normal for degree of bladder distention.

Other:

None.
IMPRESSION: 1. No significant abnormalities are identified.  No obstruction.

## 2021-07-06 NOTE — Telephone Encounter (Signed)
Please advise if the patient needs a follow up CT scan. Last scan was 07/22. ?

## 2021-07-09 ENCOUNTER — Other Ambulatory Visit: Payer: Self-pay | Admitting: Pulmonary Disease

## 2021-07-09 DIAGNOSIS — R911 Solitary pulmonary nodule: Secondary | ICD-10-CM

## 2021-07-10 ENCOUNTER — Ambulatory Visit
Admission: RE | Admit: 2021-07-10 | Discharge: 2021-07-10 | Disposition: A | Discharge status: Home / Self Care | Payer: Medicare Other | Source: Ambulatory Visit | Attending: Internal Medicine | Admitting: Internal Medicine

## 2021-07-10 DIAGNOSIS — R0602 Shortness of breath: Secondary | ICD-10-CM | POA: Diagnosis not present

## 2021-07-10 DIAGNOSIS — R911 Solitary pulmonary nodule: Secondary | ICD-10-CM

## 2021-07-10 IMAGING — CT CT CHEST W/O CM
2 of 5 series · 15 of 36 positions shown, 18 images · non-contrast
Comparison: [DATE]

CLINICAL DATA: Lung nodule surveillance. Chronic shortness of
breath.



[Series 4: chest 2.00 br40 s3 · coronal · 0.53mm/px · 3 of 164 slices shown]
[im 33/164  lung]
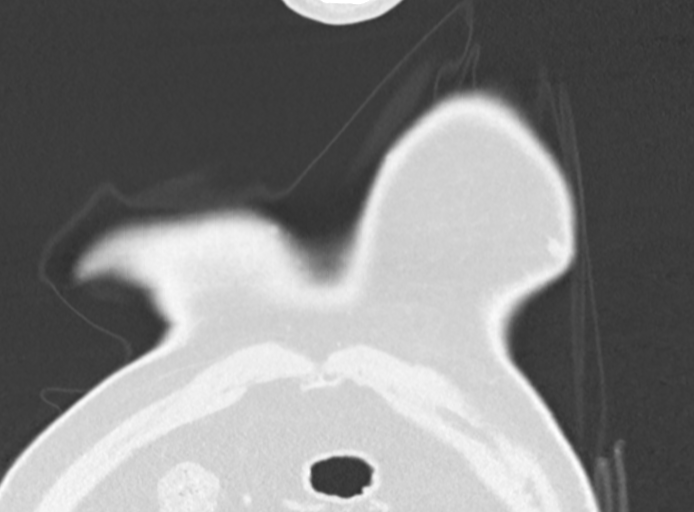
[im 66/164  lung]
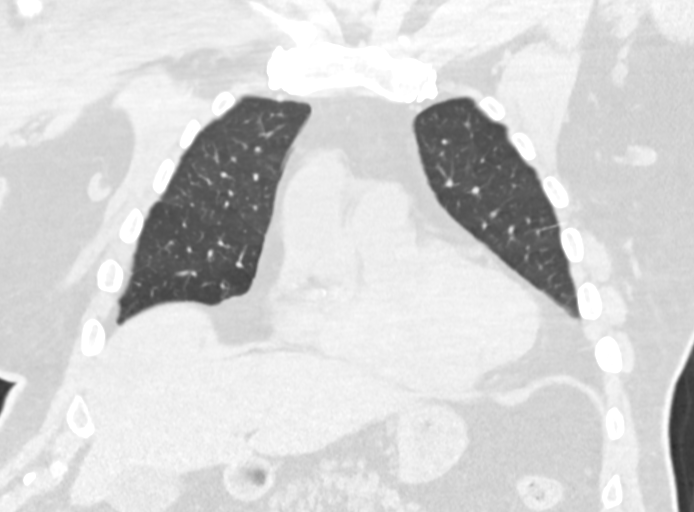
[im 98/164  lung]
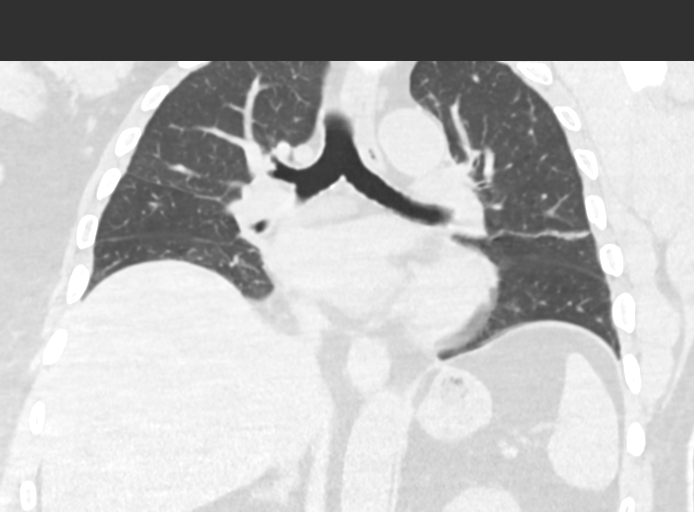

[Series 10: chest 1.00 br40 s3 super d · axial · 0.71mm/px · z∈[+1609,+1859]mm · 12 of 361 slices shown, 15 images]
[im 25/361  mediastinal]
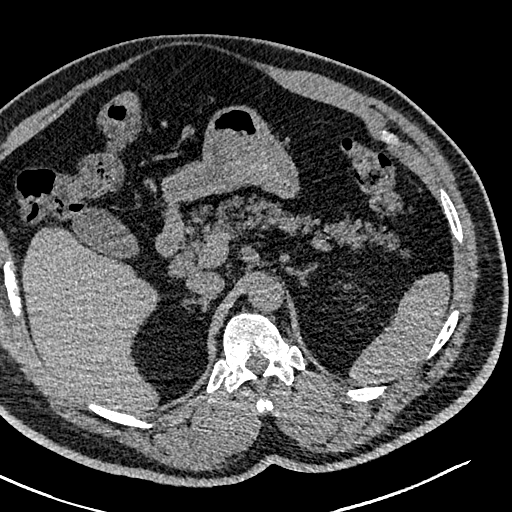
[im 25/361  lung]
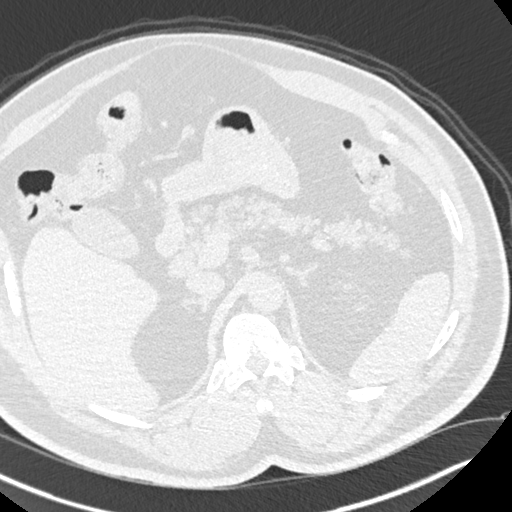
[im 49/361  lung]
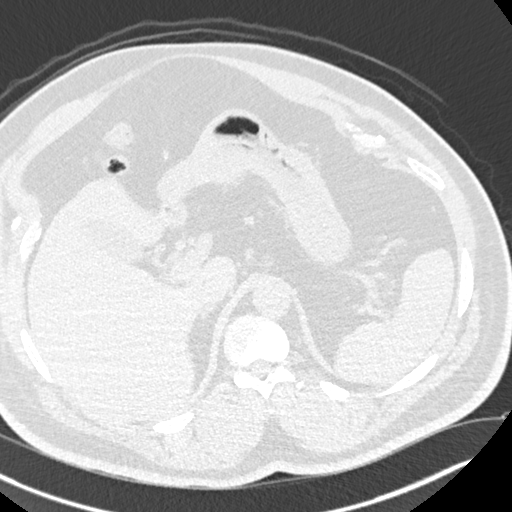
[im 73/361  lung]
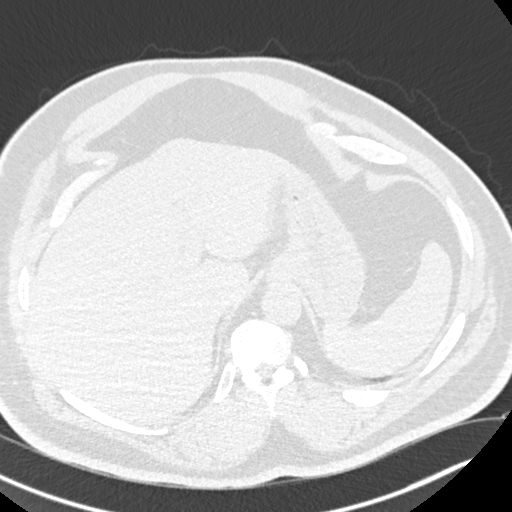
[im 121/361  lung]
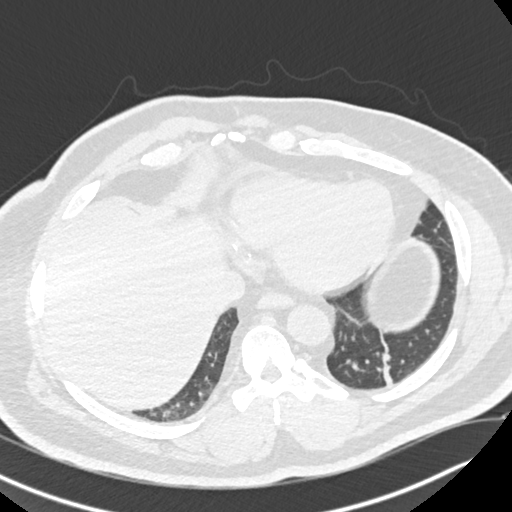
[im 145/361  mediastinal]
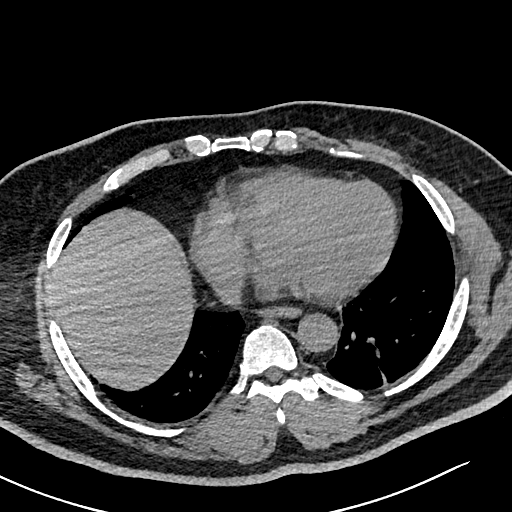
[im 145/361  lung]
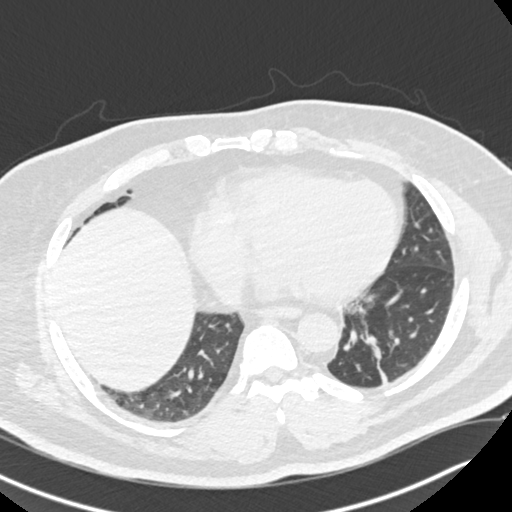
[im 169/361  lung]
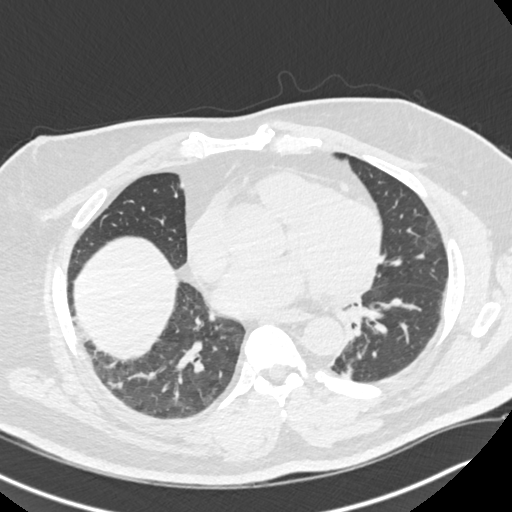
[im 193/361  lung]
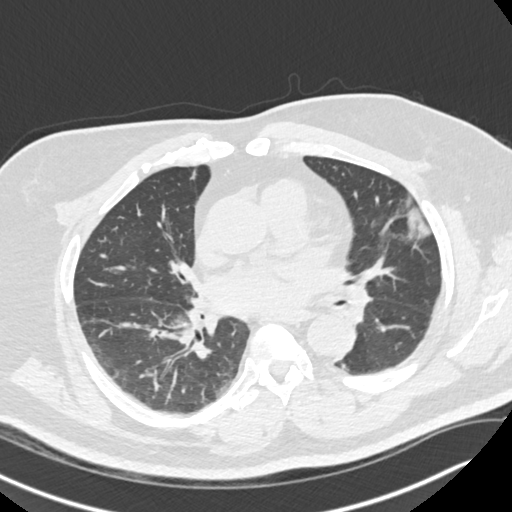
[im 217/361  lung]
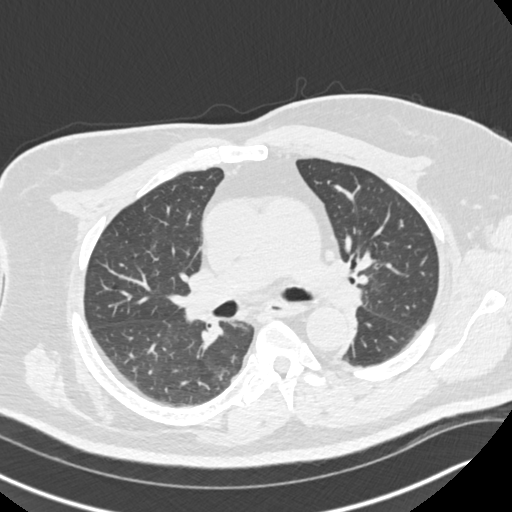
[im 241/361  mediastinal]
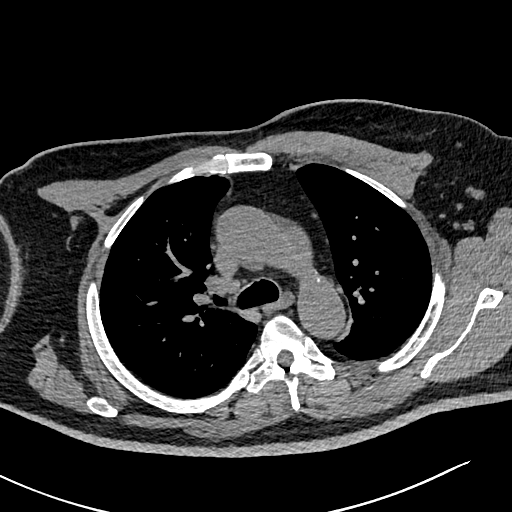
[im 241/361  lung]
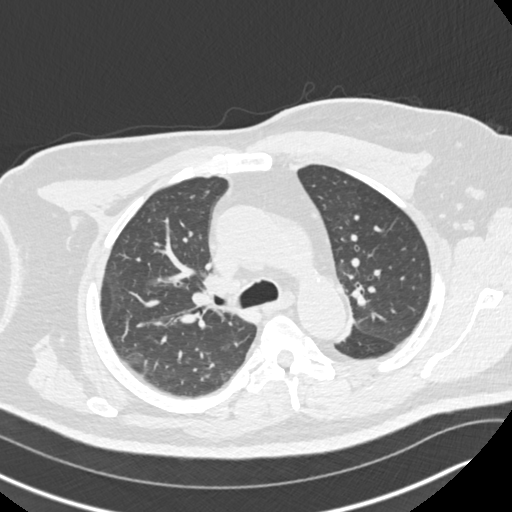
[im 289/361  lung]
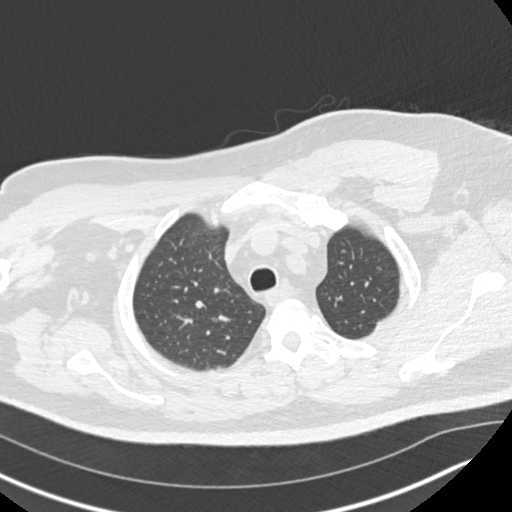
[im 313/361  lung]
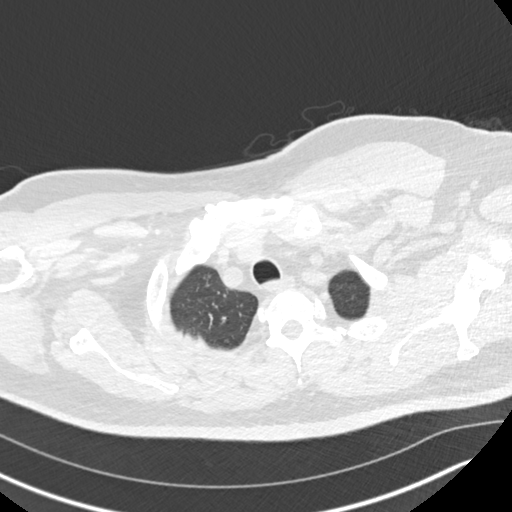
[im 337/361  lung]
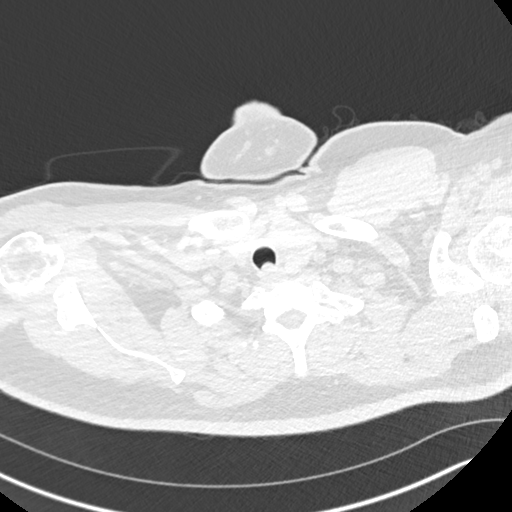

[15 of 36 positions shown; findings below may reference images not displayed]

FINDINGS: Cardiovascular: Atherosclerotic calcification in the thoracic
aortic, left anterior descending, circumflex, and right coronary
arteries. Main pulmonary artery caliber 4.3 cm, raising suspicion
for pulmonary arterial hypertension.

Mediastinum/Nodes: Unremarkable

Lungs/Pleura: The tiny pulmonary nodule seen on the prior exam are
stable and considered benign.

Scarring in the left lower lobe with increased thickness compatible
with mild progression or adjacent atelectasis. There is some new
atelectasis or scarring in the left upper lobe on image 62 series 8.
Airway thickening is present, suggesting bronchitis or reactive
airways disease. Mild mosaic attenuation in the right lower lobe
probably attributable to air trapping.

Upper Abdomen: Unremarkable

Musculoskeletal: Unremarkable
IMPRESSION: 1. The 2-3 mm pulmonary nodule shown on the prior exam appears
stable and are considered benign. No further follow is indicated.
2.  Aortic Atherosclerosis ([2O]-[2O]).  Coronary atherosclerosis.
3. Mild scarring and/or atelectasis in the left lung.
4. Airway thickening is present, suggesting bronchitis or reactive
airways disease.
5. Prominent main pulmonary artery favoring pulmonary arterial
hypertension.

## 2021-07-10 NOTE — Telephone Encounter (Signed)
Order has been corrected.  

## 2021-07-14 DIAGNOSIS — G8191 Hemiplegia, unspecified affecting right dominant side: Secondary | ICD-10-CM | POA: Diagnosis not present

## 2021-07-14 DIAGNOSIS — I639 Cerebral infarction, unspecified: Secondary | ICD-10-CM | POA: Diagnosis not present

## 2021-07-14 DIAGNOSIS — R609 Edema, unspecified: Secondary | ICD-10-CM | POA: Diagnosis not present

## 2021-07-15 DIAGNOSIS — I10 Essential (primary) hypertension: Secondary | ICD-10-CM | POA: Diagnosis not present

## 2021-07-15 DIAGNOSIS — G8190 Hemiplegia, unspecified affecting unspecified side: Secondary | ICD-10-CM | POA: Diagnosis not present

## 2021-07-20 ENCOUNTER — Encounter: Payer: Medicare Other | Admitting: Physical Medicine & Rehabilitation

## 2021-07-25 DIAGNOSIS — E1122 Type 2 diabetes mellitus with diabetic chronic kidney disease: Secondary | ICD-10-CM | POA: Diagnosis not present

## 2021-07-25 DIAGNOSIS — R21 Rash and other nonspecific skin eruption: Secondary | ICD-10-CM | POA: Diagnosis not present

## 2021-07-25 DIAGNOSIS — R600 Localized edema: Secondary | ICD-10-CM | POA: Diagnosis not present

## 2021-08-06 ENCOUNTER — Other Ambulatory Visit: Payer: Self-pay | Admitting: *Deleted

## 2021-08-06 DIAGNOSIS — R911 Solitary pulmonary nodule: Secondary | ICD-10-CM

## 2021-08-06 DIAGNOSIS — Z720 Tobacco use: Secondary | ICD-10-CM

## 2021-08-07 ENCOUNTER — Other Ambulatory Visit (HOSPITAL_COMMUNITY): Payer: Self-pay | Admitting: Gerontology

## 2021-08-07 DIAGNOSIS — M25562 Pain in left knee: Secondary | ICD-10-CM

## 2021-08-09 ENCOUNTER — Ambulatory Visit (HOSPITAL_COMMUNITY)
Admission: RE | Admit: 2021-08-09 | Discharge: 2021-08-09 | Disposition: A | Discharge status: Home / Self Care | Payer: Medicare Other | Source: Ambulatory Visit | Attending: Gerontology | Admitting: Gerontology

## 2021-08-09 DIAGNOSIS — M25561 Pain in right knee: Secondary | ICD-10-CM | POA: Diagnosis not present

## 2021-08-09 DIAGNOSIS — M25562 Pain in left knee: Secondary | ICD-10-CM | POA: Insufficient documentation

## 2021-08-09 IMAGING — DX DG KNEE COMPLETE 4+V*L*
4 series · 4 of 4 positions shown · non-contrast
Comparison: Left knee radiographs [DATE]

CLINICAL DATA: Bilateral knee pain.

EXAM:
LEFT KNEE - COMPLETE 4+ VIEW

[knee ap]
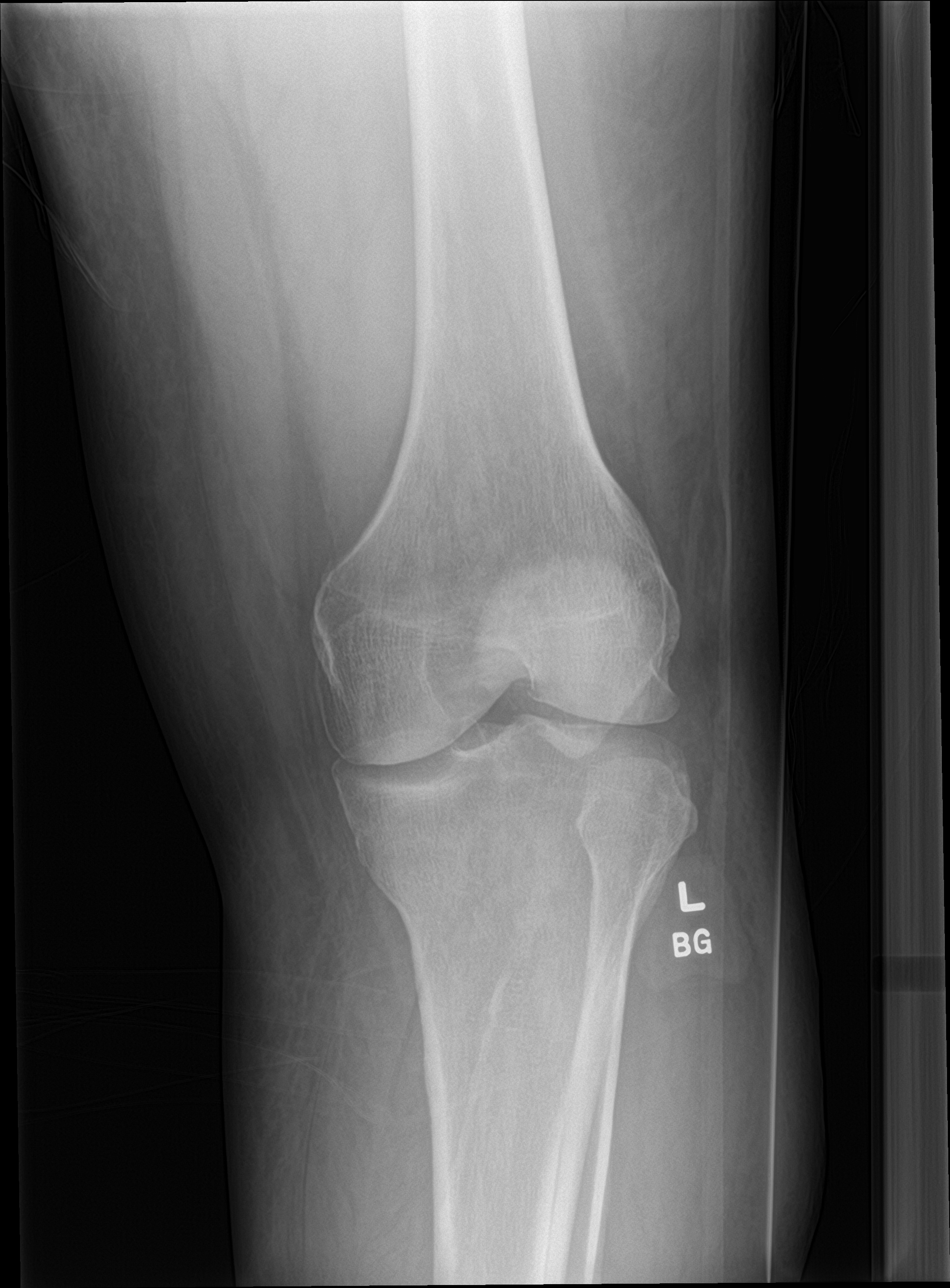

[knee obl (1 of 2)]
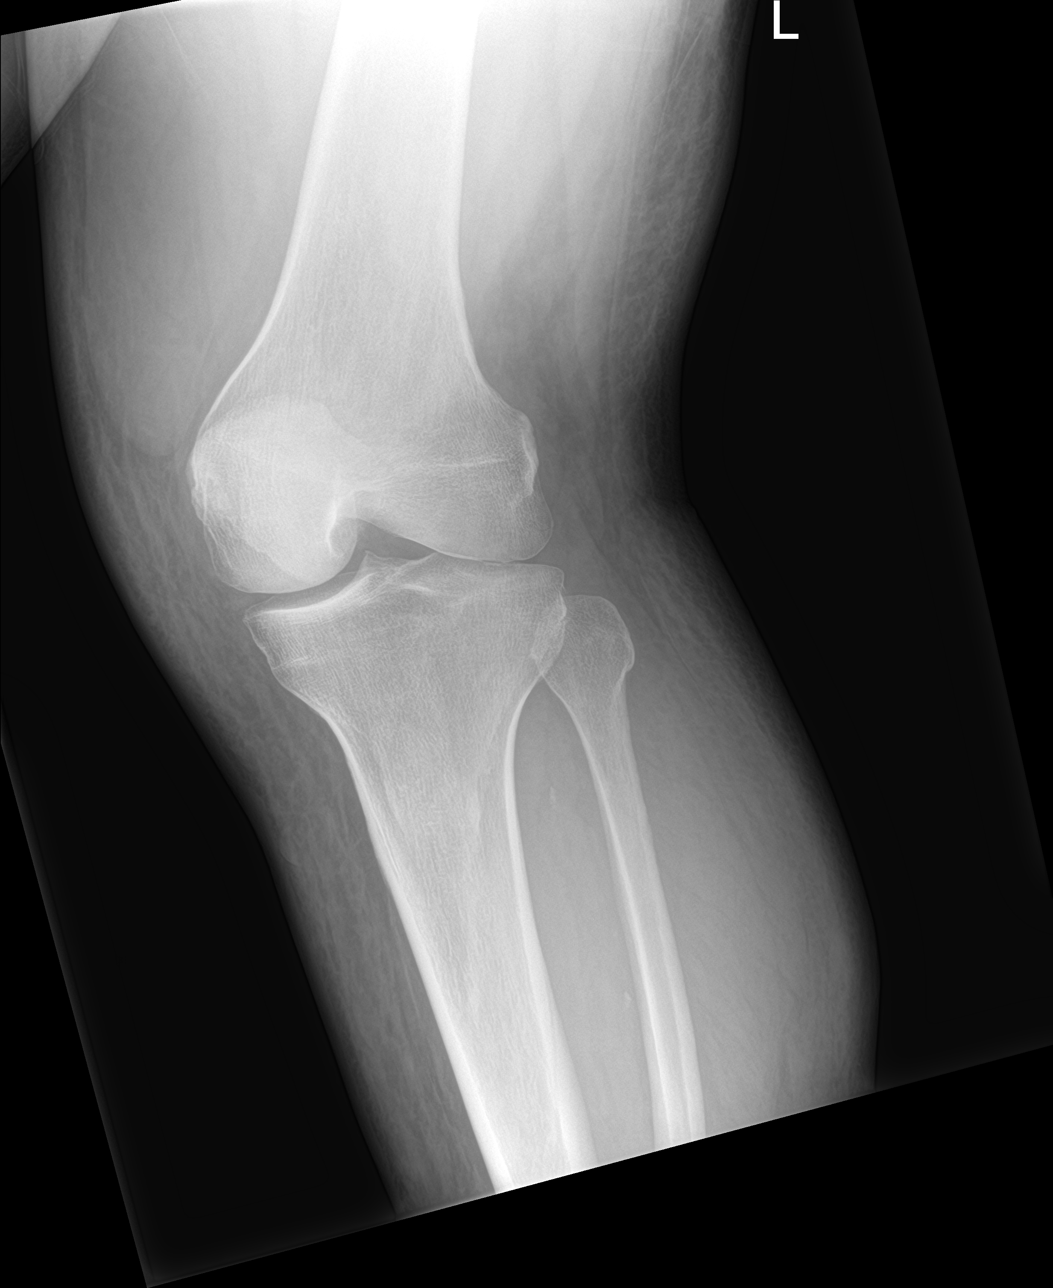

[knee lat]
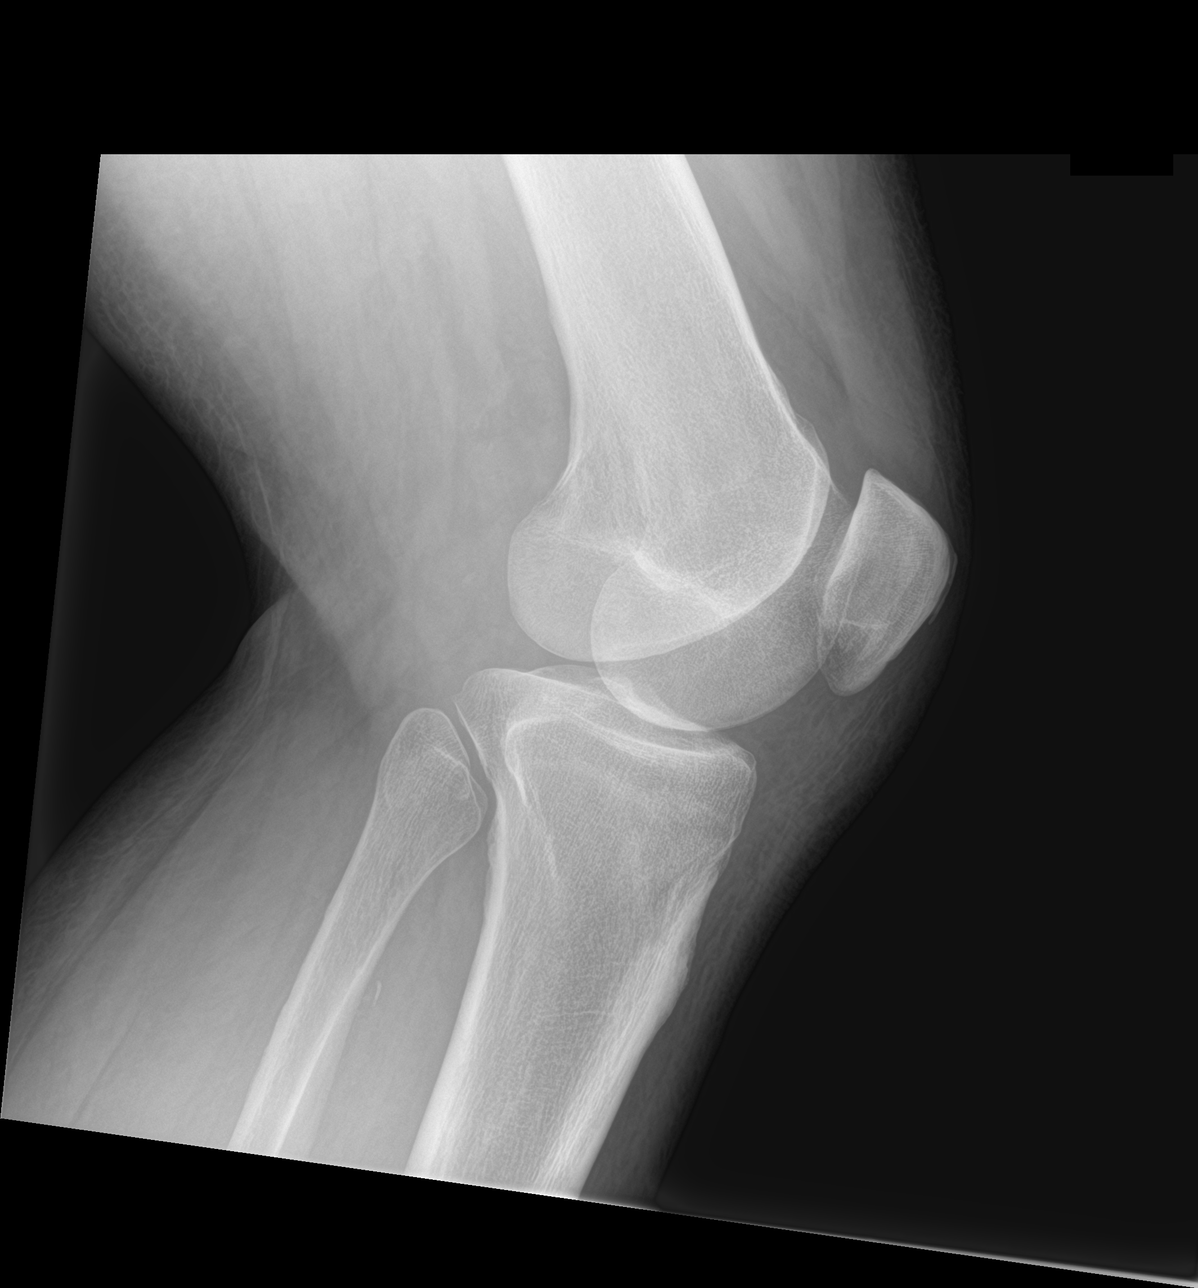

[knee obl (2 of 2)]
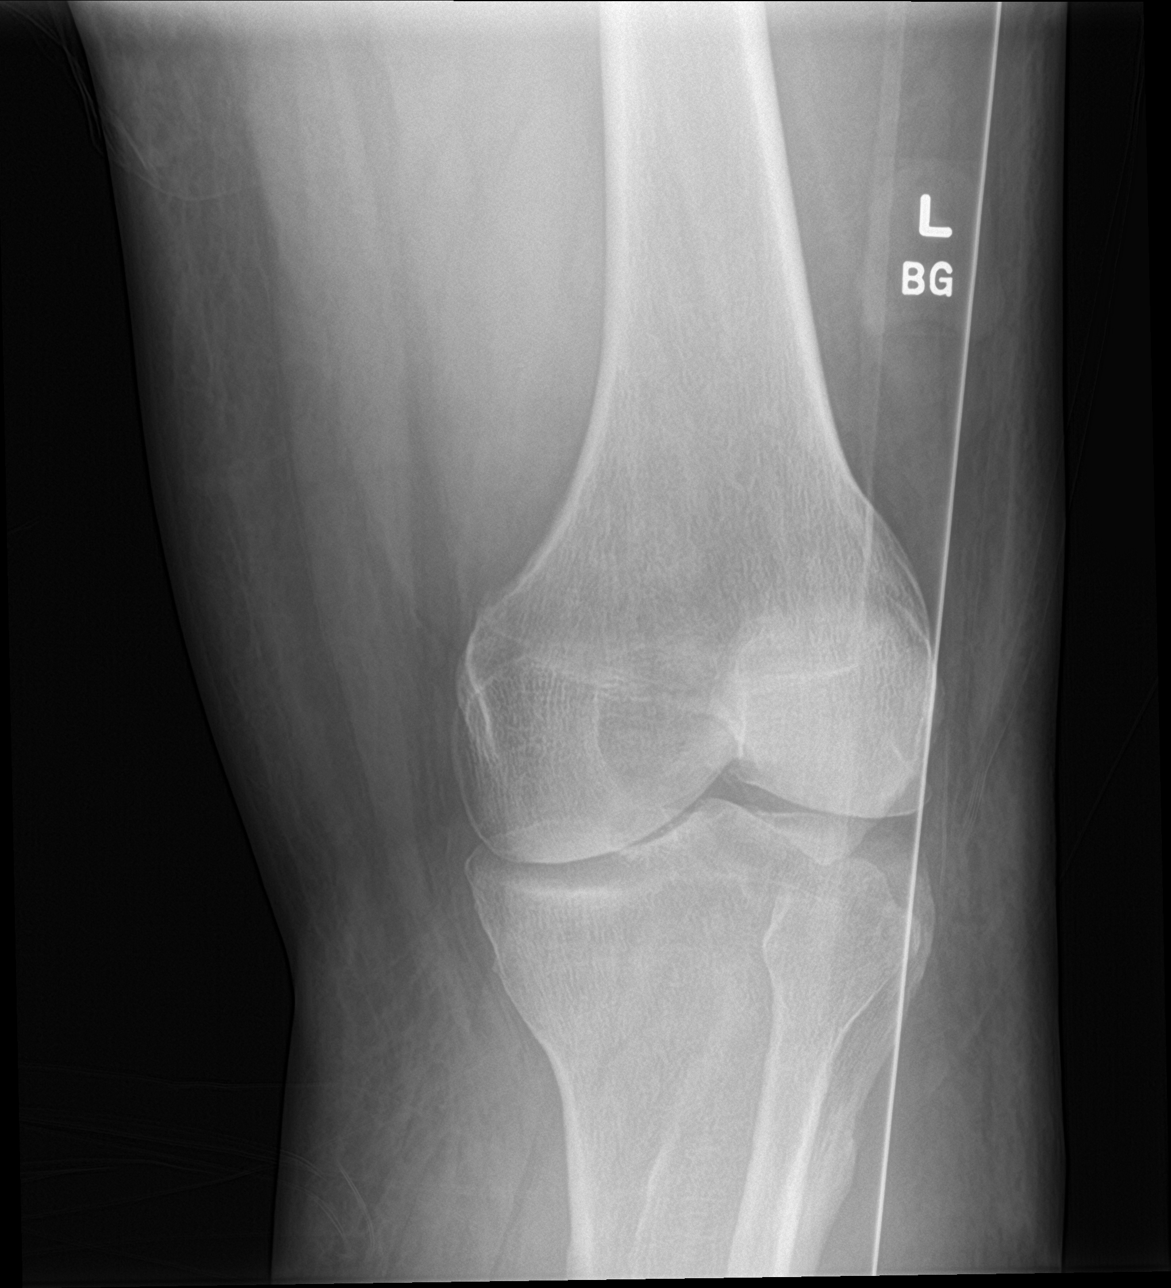

[4 of 4 positions shown; findings below may reference images not displayed]

FINDINGS: Mild medial compartment joint space narrowing. Tiny joint effusion.
No acute fracture is seen. No dislocation.
IMPRESSION: Mild medial compartment osteoarthritis. This has progressed compared
to [DATE] prior radiographs.

## 2021-08-09 IMAGING — DX DG KNEE COMPLETE 4+V*R*
4 series · 4 of 4 positions shown · non-contrast
Comparison: None Available.

CLINICAL DATA: Bilateral knee pain.

EXAM:
RIGHT KNEE - COMPLETE 4+ VIEW

[knee obl (1 of 2)]
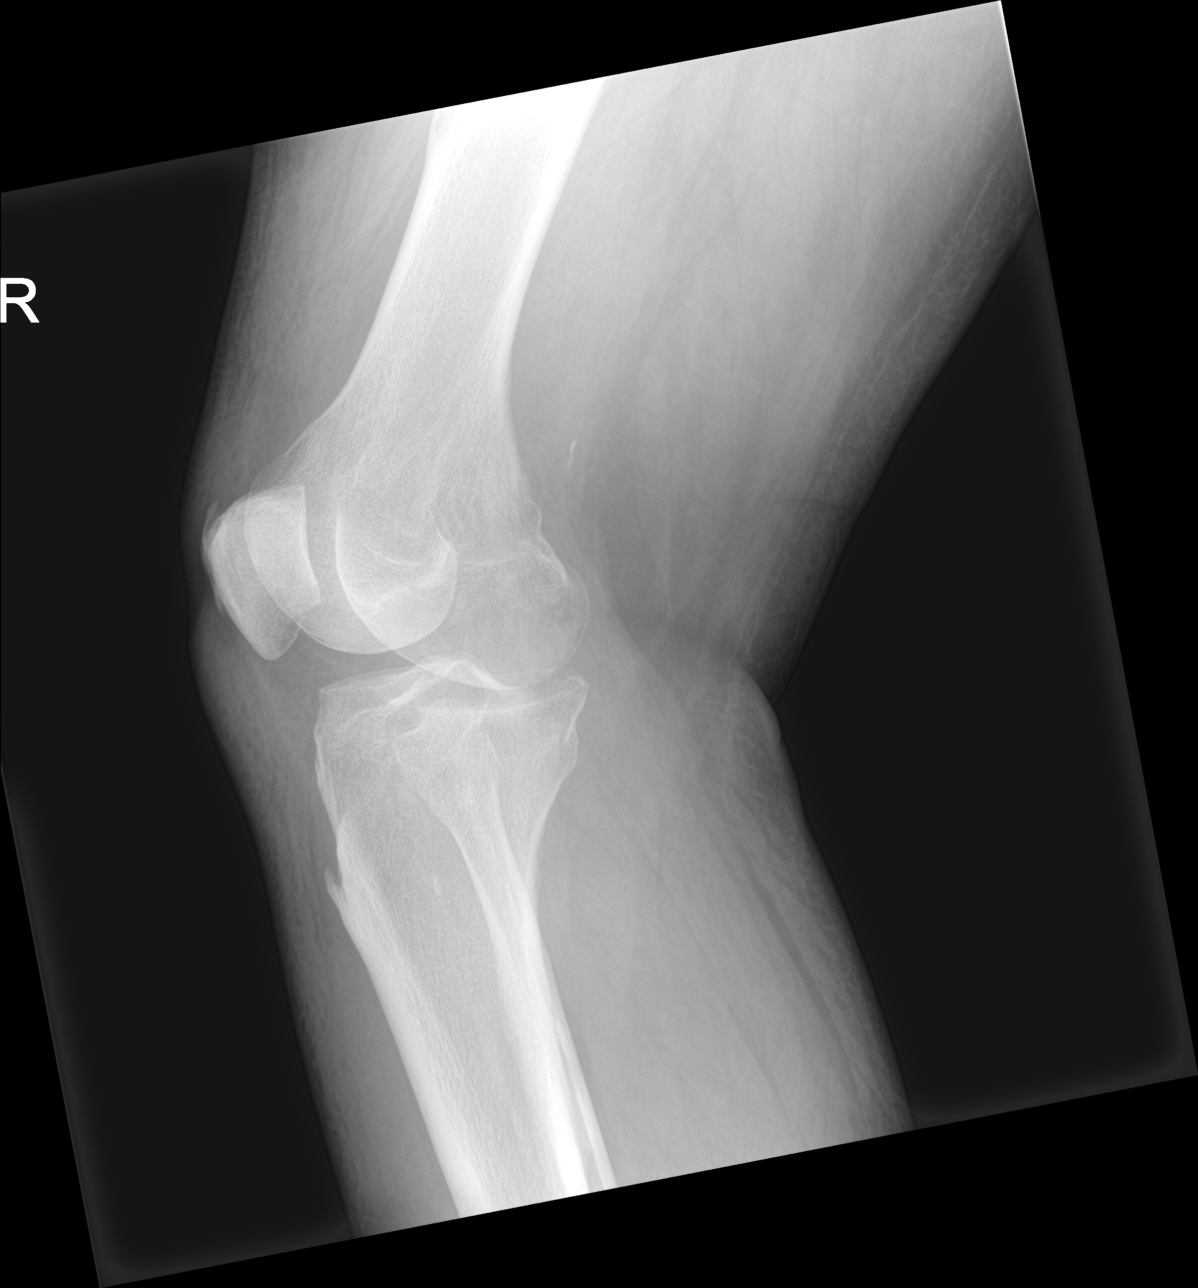

[knee lat]
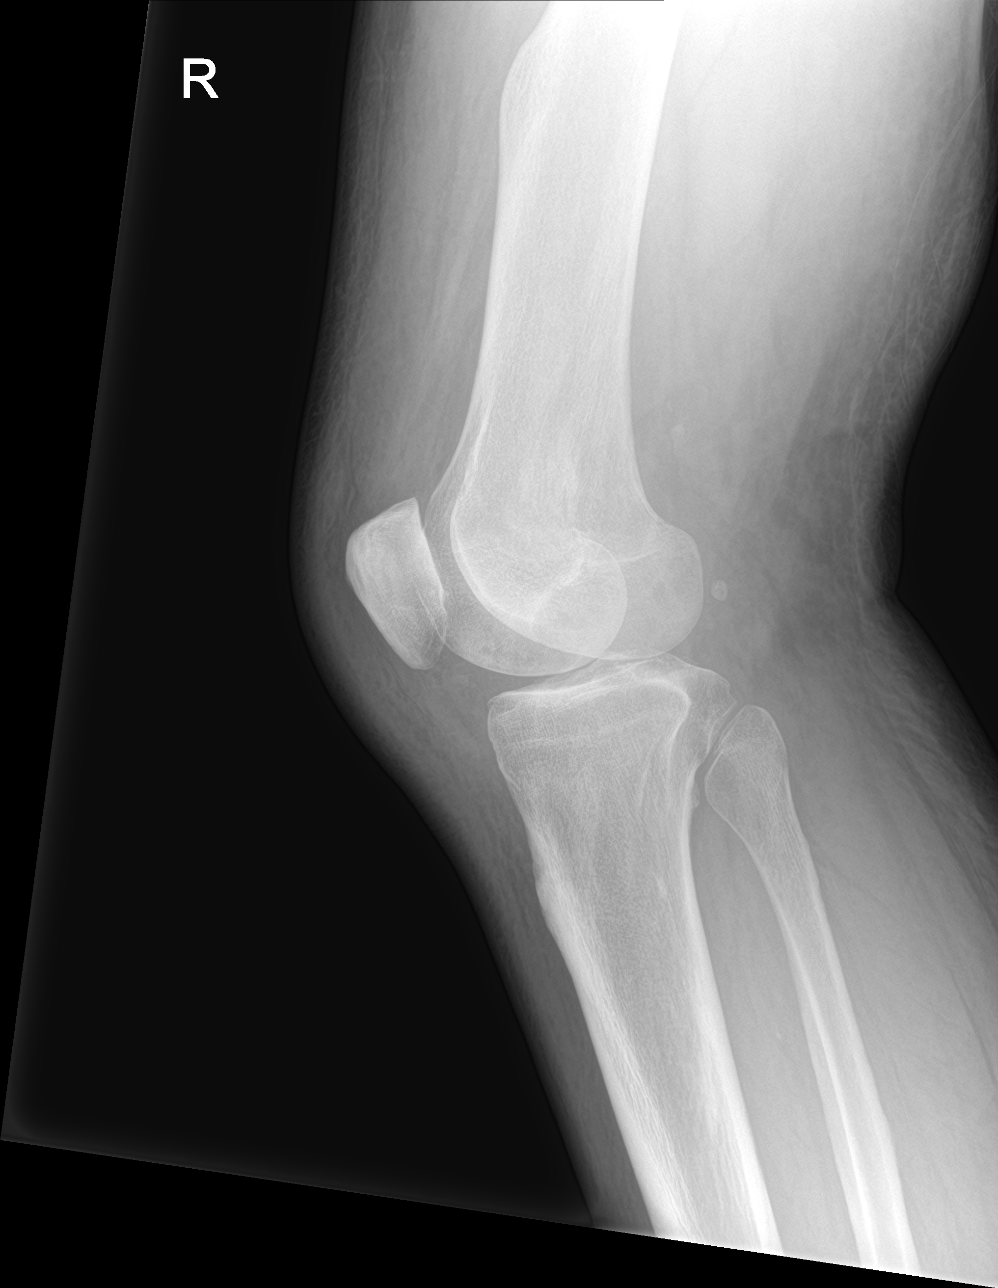

[knee ap]
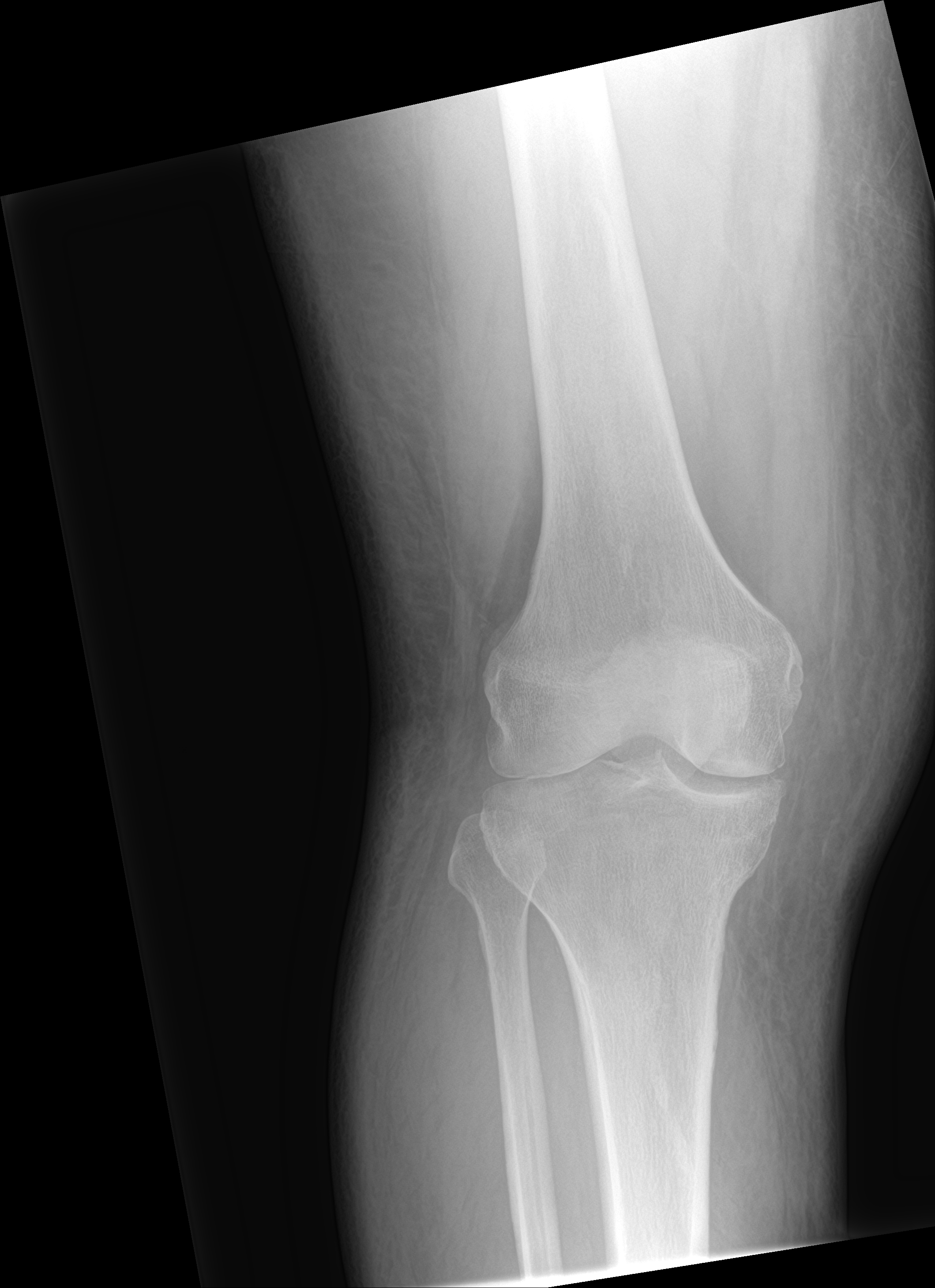

[knee obl (2 of 2)]
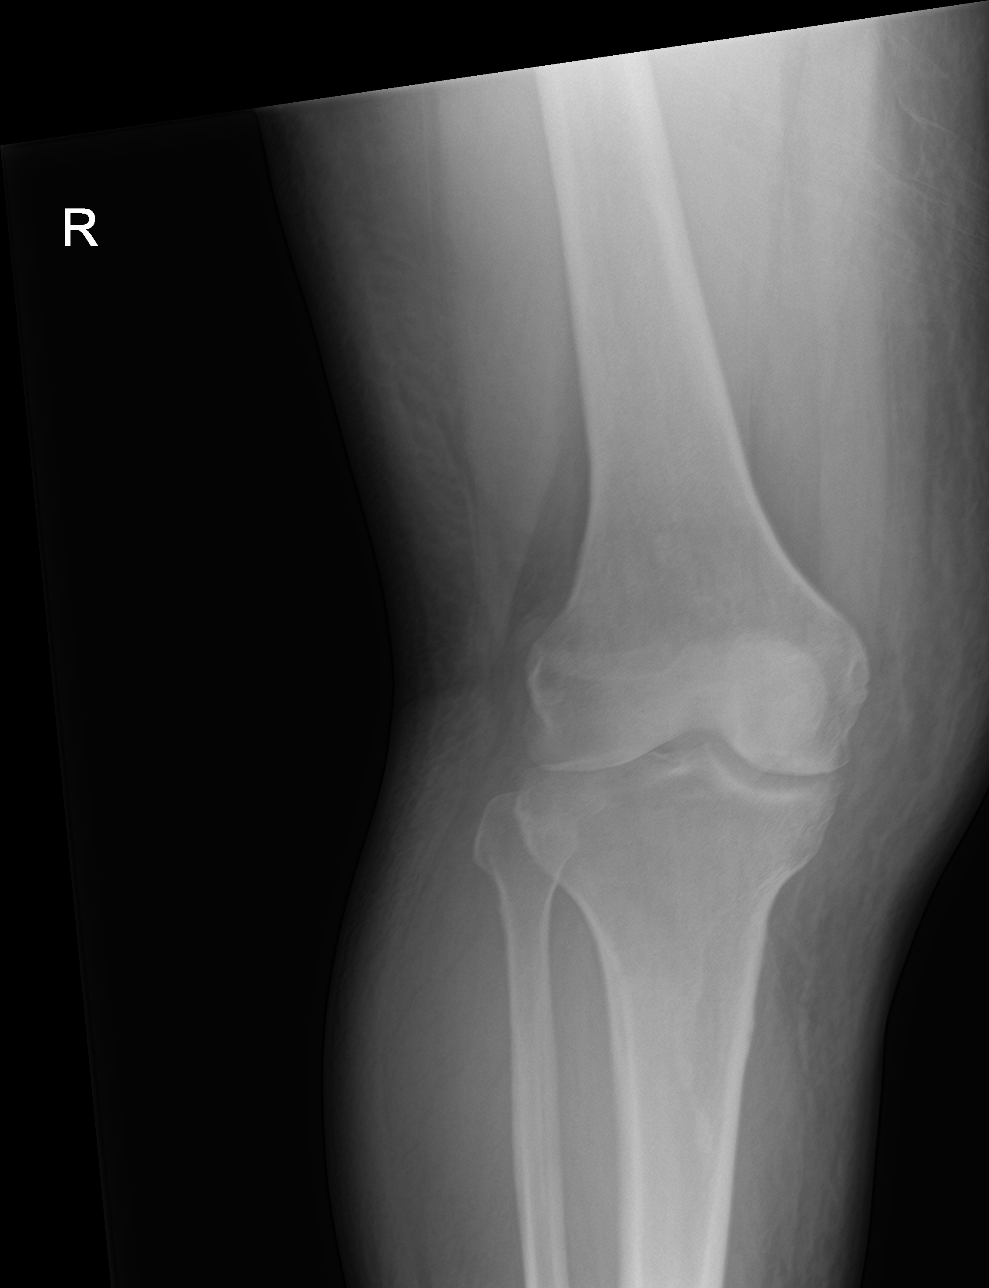

[4 of 4 positions shown; findings below may reference images not displayed]

FINDINGS: Mild tricompartmental joint space narrowing. Minimal peripheral
medial component degenerative osteophytosis. No joint effusion.
Minimal chronic enthesopathic change at the quadriceps greater than
patellar tendon insertions on the patella. Mild enthesopathic
spurring at the patellar tendon insertion on the tibial tubercle. No
acute fracture or dislocation. Mild smooth thickening of the distal
anterior femoral diaphysis may represent an old healed fracture.
IMPRESSION: 1. Mild tricompartmental osteoarthritis.
2. Mild chronic enthesopathic change at the quadriceps and patellar
tendon insertions on the patella and the patellar tendon insertion
on the tibial tubercle.

## 2021-08-12 ENCOUNTER — Emergency Department (HOSPITAL_COMMUNITY): Payer: Medicare Other

## 2021-08-12 ENCOUNTER — Inpatient Hospital Stay (HOSPITAL_COMMUNITY)
Admission: EM | Admit: 2021-08-12 | Discharge: 2021-08-15 | DRG: 603 | Disposition: A | Discharge status: Home Health | Payer: Medicare Other | Attending: Family Medicine | Admitting: Family Medicine

## 2021-08-12 ENCOUNTER — Other Ambulatory Visit: Payer: Self-pay

## 2021-08-12 ENCOUNTER — Emergency Department (HOSPITAL_BASED_OUTPATIENT_CLINIC_OR_DEPARTMENT_OTHER): Payer: Medicare Other

## 2021-08-12 ENCOUNTER — Encounter (HOSPITAL_COMMUNITY): Payer: Self-pay

## 2021-08-12 DIAGNOSIS — L409 Psoriasis, unspecified: Secondary | ICD-10-CM | POA: Diagnosis not present

## 2021-08-12 DIAGNOSIS — M7989 Other specified soft tissue disorders: Secondary | ICD-10-CM | POA: Diagnosis not present

## 2021-08-12 DIAGNOSIS — M79604 Pain in right leg: Secondary | ICD-10-CM

## 2021-08-12 DIAGNOSIS — N179 Acute kidney failure, unspecified: Secondary | ICD-10-CM | POA: Diagnosis not present

## 2021-08-12 DIAGNOSIS — Z7902 Long term (current) use of antithrombotics/antiplatelets: Secondary | ICD-10-CM | POA: Diagnosis not present

## 2021-08-12 DIAGNOSIS — L538 Other specified erythematous conditions: Secondary | ICD-10-CM

## 2021-08-12 DIAGNOSIS — I10 Essential (primary) hypertension: Secondary | ICD-10-CM | POA: Diagnosis present

## 2021-08-12 DIAGNOSIS — I130 Hypertensive heart and chronic kidney disease with heart failure and stage 1 through stage 4 chronic kidney disease, or unspecified chronic kidney disease: Secondary | ICD-10-CM | POA: Diagnosis not present

## 2021-08-12 DIAGNOSIS — R739 Hyperglycemia, unspecified: Secondary | ICD-10-CM

## 2021-08-12 DIAGNOSIS — I69351 Hemiplegia and hemiparesis following cerebral infarction affecting right dominant side: Secondary | ICD-10-CM

## 2021-08-12 DIAGNOSIS — E1122 Type 2 diabetes mellitus with diabetic chronic kidney disease: Secondary | ICD-10-CM | POA: Diagnosis present

## 2021-08-12 DIAGNOSIS — I5030 Unspecified diastolic (congestive) heart failure: Secondary | ICD-10-CM | POA: Diagnosis not present

## 2021-08-12 DIAGNOSIS — I5032 Chronic diastolic (congestive) heart failure: Secondary | ICD-10-CM | POA: Diagnosis present

## 2021-08-12 DIAGNOSIS — R600 Localized edema: Secondary | ICD-10-CM

## 2021-08-12 DIAGNOSIS — Z7982 Long term (current) use of aspirin: Secondary | ICD-10-CM | POA: Diagnosis not present

## 2021-08-12 DIAGNOSIS — E1165 Type 2 diabetes mellitus with hyperglycemia: Secondary | ICD-10-CM | POA: Diagnosis not present

## 2021-08-12 DIAGNOSIS — L281 Prurigo nodularis: Secondary | ICD-10-CM | POA: Diagnosis present

## 2021-08-12 DIAGNOSIS — J449 Chronic obstructive pulmonary disease, unspecified: Secondary | ICD-10-CM | POA: Diagnosis present

## 2021-08-12 DIAGNOSIS — Z79899 Other long term (current) drug therapy: Secondary | ICD-10-CM

## 2021-08-12 DIAGNOSIS — E119 Type 2 diabetes mellitus without complications: Secondary | ICD-10-CM

## 2021-08-12 DIAGNOSIS — G4733 Obstructive sleep apnea (adult) (pediatric): Secondary | ICD-10-CM | POA: Diagnosis present

## 2021-08-12 DIAGNOSIS — E785 Hyperlipidemia, unspecified: Secondary | ICD-10-CM | POA: Diagnosis present

## 2021-08-12 DIAGNOSIS — M79605 Pain in left leg: Secondary | ICD-10-CM | POA: Diagnosis not present

## 2021-08-12 DIAGNOSIS — E876 Hypokalemia: Secondary | ICD-10-CM | POA: Diagnosis not present

## 2021-08-12 DIAGNOSIS — Z888 Allergy status to other drugs, medicaments and biological substances status: Secondary | ICD-10-CM | POA: Diagnosis not present

## 2021-08-12 DIAGNOSIS — N1832 Chronic kidney disease, stage 3b: Secondary | ICD-10-CM | POA: Diagnosis present

## 2021-08-12 DIAGNOSIS — L03115 Cellulitis of right lower limb: Principal | ICD-10-CM

## 2021-08-12 LAB — COMPREHENSIVE METABOLIC PANEL
ALT: 17 U/L (ref 0–44)
AST: 12 U/L — ABNORMAL LOW (ref 15–41)
Albumin: 4.1 g/dL (ref 3.5–5.0)
Alkaline Phosphatase: 118 U/L (ref 38–126)
Anion gap: 12 (ref 5–15)
BUN: 57 mg/dL — ABNORMAL HIGH (ref 6–20)
CO2: 29 mmol/L (ref 22–32)
Calcium: 9.9 mg/dL (ref 8.9–10.3)
Chloride: 92 mmol/L — ABNORMAL LOW (ref 98–111)
Creatinine, Ser: 2.99 mg/dL — ABNORMAL HIGH (ref 0.61–1.24)
GFR, Estimated: 24 mL/min — ABNORMAL LOW (ref >60)
Glucose, Bld: 455 mg/dL — ABNORMAL HIGH (ref 70–99)
Potassium: 3.7 mmol/L (ref 3.5–5.1)
Sodium: 133 mmol/L — ABNORMAL LOW (ref 135–145)
Total Bilirubin: 0.9 mg/dL (ref 0.3–1.2)
Total Protein: 7.3 g/dL (ref 6.5–8.1)

## 2021-08-12 LAB — CBC
HCT: 39.2 % (ref 39.0–52.0)
Hemoglobin: 12.8 g/dL — ABNORMAL LOW (ref 13.0–17.0)
MCH: 25.8 pg — ABNORMAL LOW (ref 26.0–34.0)
MCHC: 32.7 g/dL (ref 30.0–36.0)
MCV: 79 fL — ABNORMAL LOW (ref 80.0–100.0)
Platelets: 223 10*3/uL (ref 150–400)
RBC: 4.96 MIL/uL (ref 4.22–5.81)
RDW: 11.9 % (ref 11.5–15.5)
WBC: 9 10*3/uL (ref 4.0–10.5)
nRBC: 0 % (ref 0.0–0.2)

## 2021-08-12 LAB — URINALYSIS, ROUTINE W REFLEX MICROSCOPIC
Bacteria, UA: NONE SEEN
Bilirubin Urine: NEGATIVE
Glucose, UA: >=500 mg/dL — AB
Hgb urine dipstick: NEGATIVE
Ketones, ur: NEGATIVE mg/dL
Leukocytes,Ua: NEGATIVE
Nitrite: NEGATIVE
Protein, ur: NEGATIVE mg/dL
Specific Gravity, Urine: 1.016 (ref 1.005–1.030)
pH: 5 (ref 5.0–8.0)

## 2021-08-12 LAB — CREATININE, URINE, RANDOM: Creatinine, Urine: 72.09 mg/dL

## 2021-08-12 LAB — BRAIN NATRIURETIC PEPTIDE: B Natriuretic Peptide: 6.5 pg/mL (ref 0.0–100.0)

## 2021-08-12 LAB — CBG MONITORING, ED: Glucose-Capillary: 397 mg/dL — ABNORMAL HIGH (ref 70–99)

## 2021-08-12 LAB — HEMOGLOBIN A1C
Hgb A1c MFr Bld: 11.8 % — ABNORMAL HIGH (ref 4.8–5.6)
Mean Plasma Glucose: 291.96 mg/dL

## 2021-08-12 LAB — GLUCOSE, CAPILLARY
Glucose-Capillary: 257 mg/dL — ABNORMAL HIGH (ref 70–99)
Glucose-Capillary: 202 mg/dL — ABNORMAL HIGH (ref 70–99)

## 2021-08-12 IMAGING — CR DG TIBIA/FIBULA 2V*R*
4 series · 4 of 4 positions shown · non-contrast
Comparison: No priors.

CLINICAL DATA: 55-year-old male with history of leg swelling and
pain.

EXAM:
RIGHT TIBIA AND FIBULA - 2 VIEW

[tibia ap (1 of 2)]
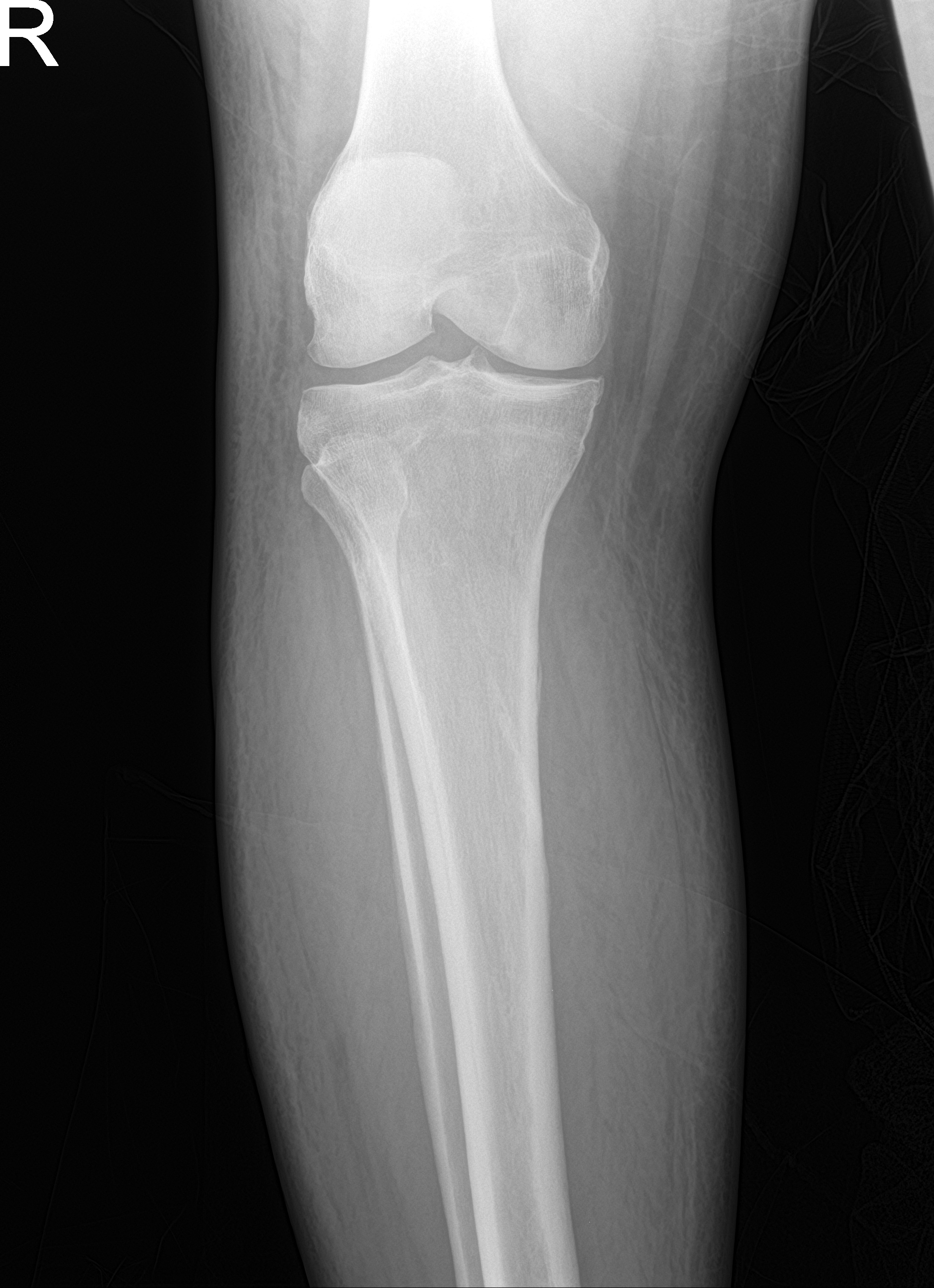

[tibia ap (2 of 2)]
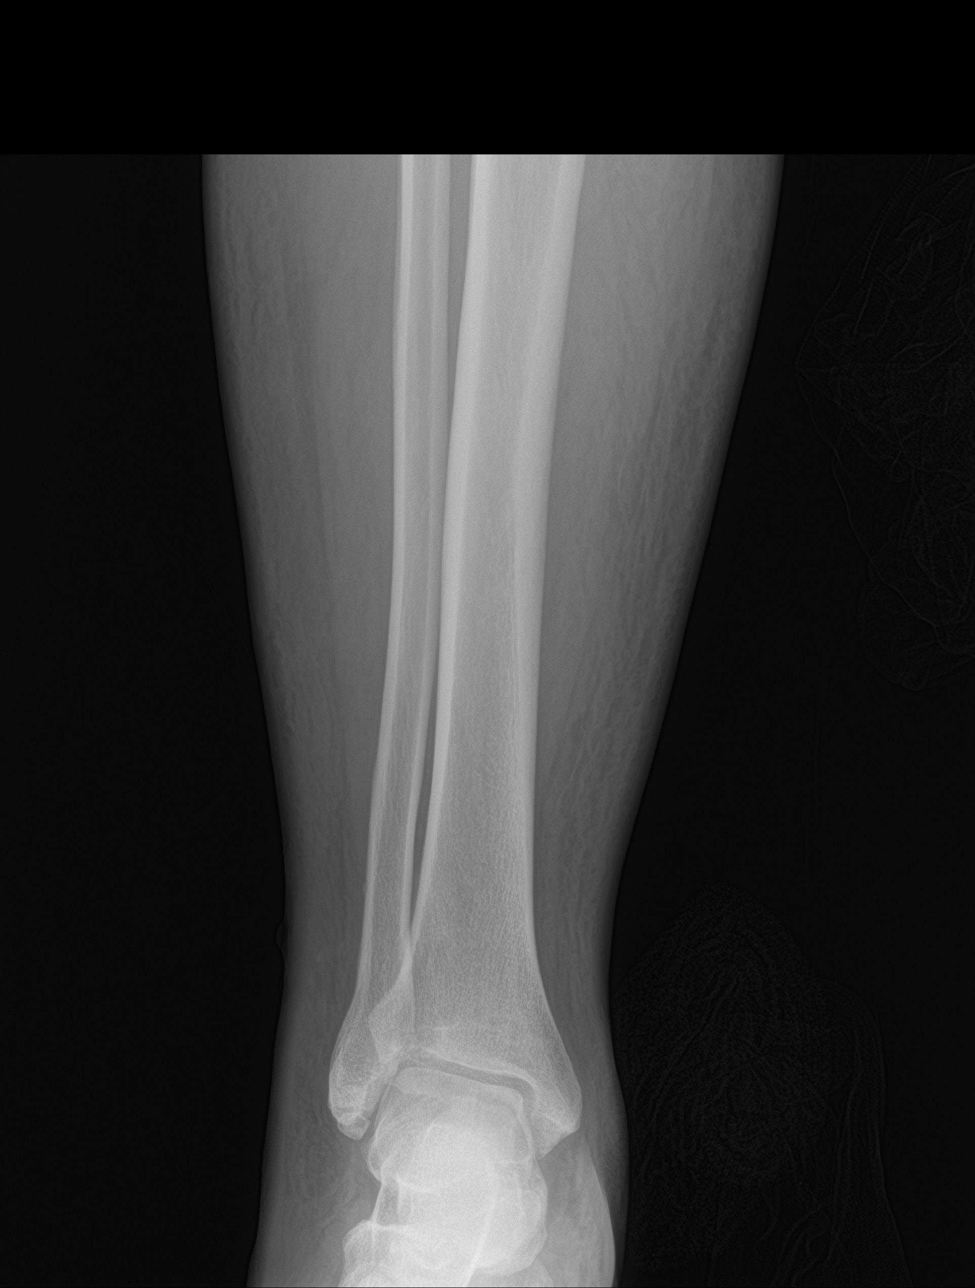

[tibia lat (1 of 2)]
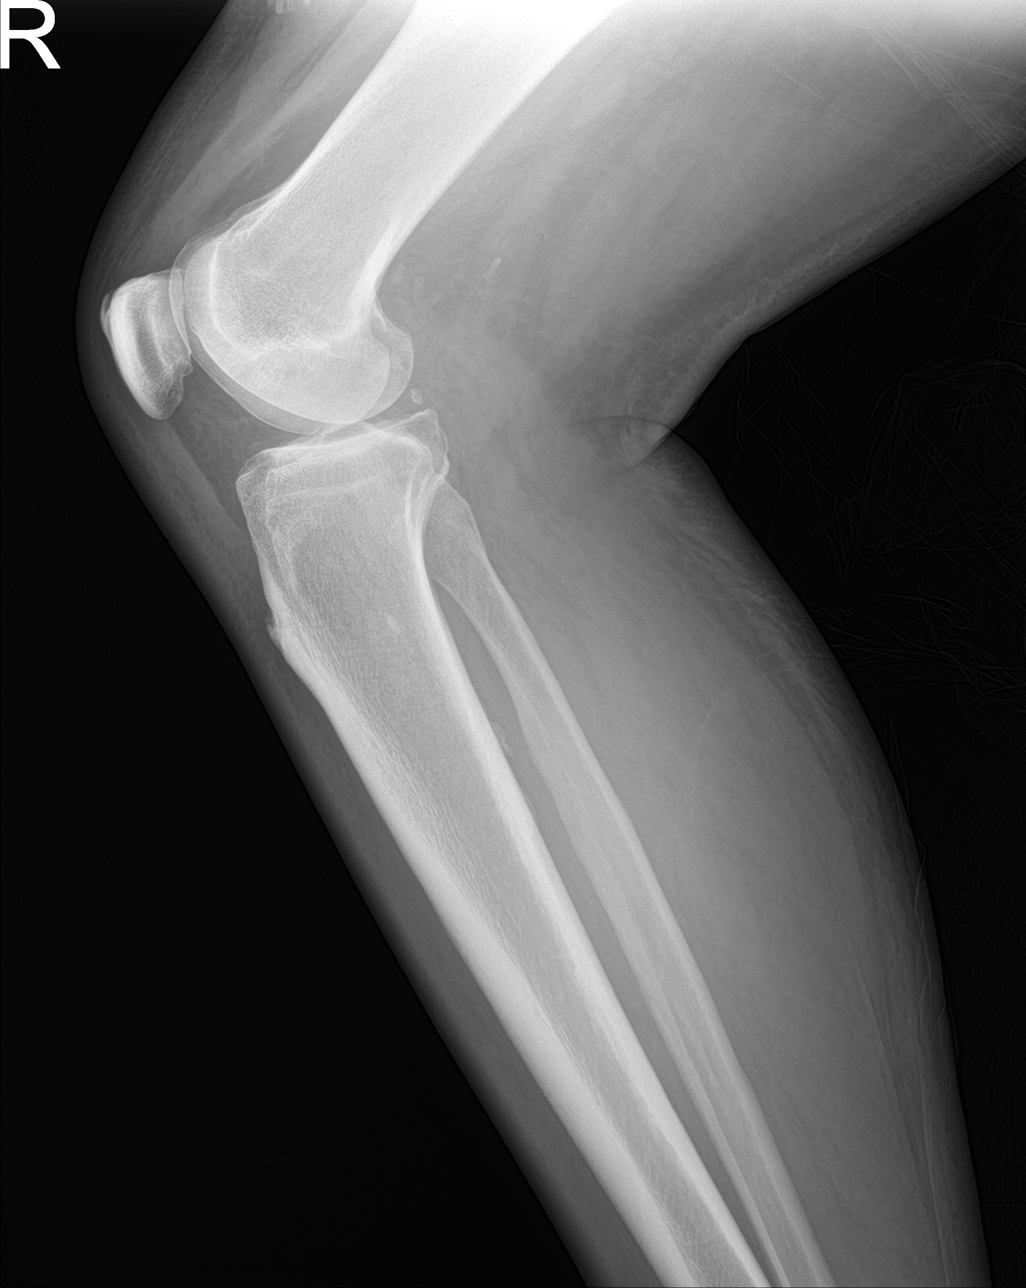

[tibia lat (2 of 2)]
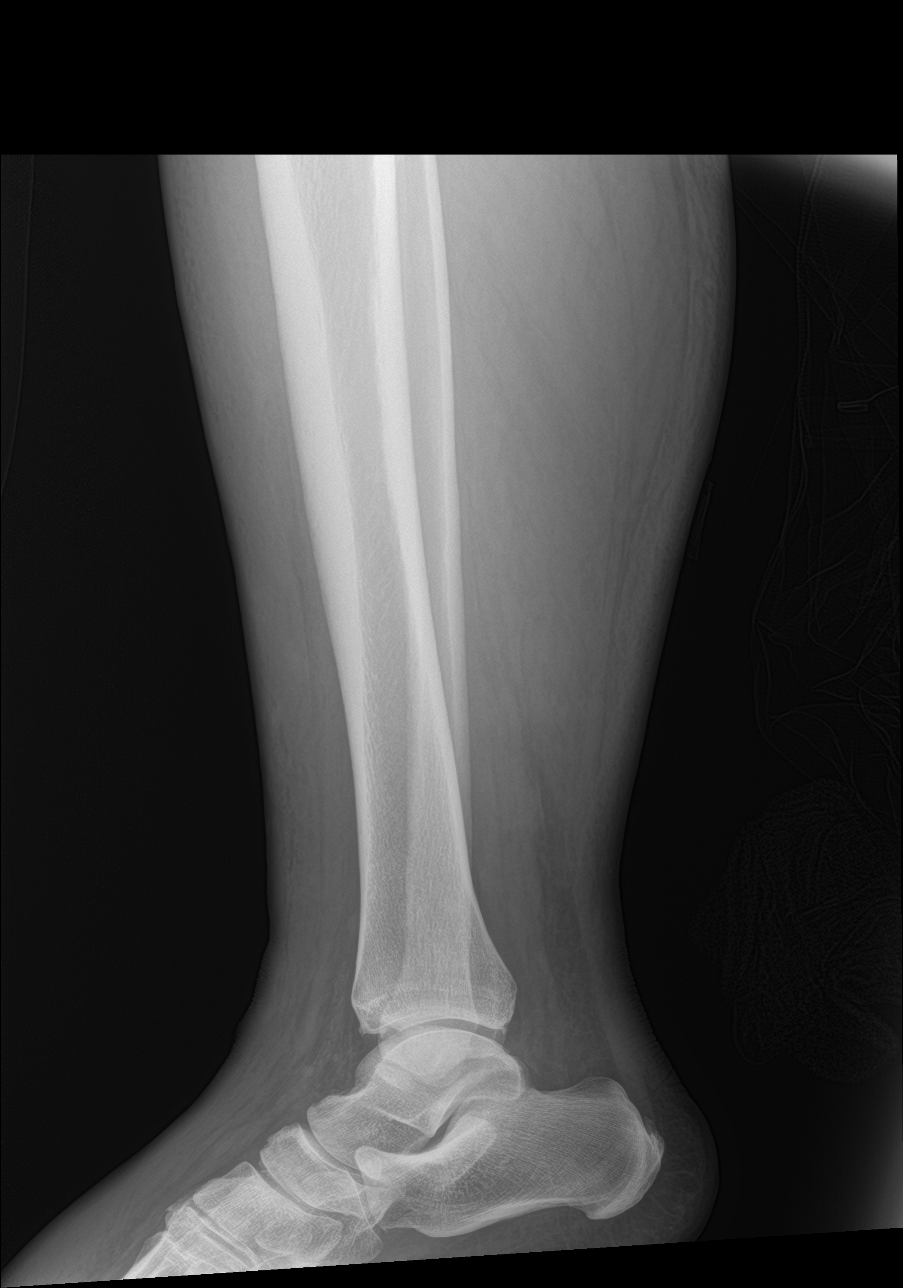

[4 of 4 positions shown; findings below may reference images not displayed]

FINDINGS: There is no evidence of fracture or other focal bone lesions. Soft
tissues are diffusely swollen.
IMPRESSION: 1. Diffuse soft tissue swelling without acute osseous abnormality.

## 2021-08-12 MED ORDER — CEFAZOLIN SODIUM-DEXTROSE 1-4 GM/50ML-% IV SOLN
1.0000 g | Freq: Once | INTRAVENOUS | Status: AC
  Administered 2021-08-12: 1 g via INTRAVENOUS
  Filled 2021-08-12: qty 50

## 2021-08-12 MED ORDER — FENTANYL CITRATE PF 50 MCG/ML IJ SOSY
50.0000 ug | PREFILLED_SYRINGE | Freq: Once | INTRAMUSCULAR | Status: AC
  Administered 2021-08-12: 50 ug via INTRAVENOUS
  Filled 2021-08-12: qty 1

## 2021-08-12 MED ORDER — FLUTICASONE FUROATE-VILANTEROL 100-25 MCG/ACT IN AEPB
1.0000 | INHALATION_SPRAY | Freq: Every day | RESPIRATORY_TRACT | Status: DC
  Administered 2021-08-13 – 2021-08-15 (×3): 1 via RESPIRATORY_TRACT
  Filled 2021-08-12: qty 28

## 2021-08-12 MED ORDER — HYDRALAZINE HCL 25 MG PO TABS: 25.0000 mg | ORAL_TABLET | Freq: Four times a day (QID) | ORAL | Status: DC | PRN

## 2021-08-12 MED ORDER — SODIUM CHLORIDE 0.9 % IV BOLUS
500.0000 mL | Freq: Once | INTRAVENOUS | Status: AC
  Administered 2021-08-12: 500 mL via INTRAVENOUS

## 2021-08-12 MED ORDER — VANCOMYCIN VARIABLE DOSE PER UNSTABLE RENAL FUNCTION (PHARMACIST DOSING): Status: DC

## 2021-08-12 MED ORDER — GABAPENTIN 300 MG PO CAPS
300.0000 mg | ORAL_CAPSULE | Freq: Every day | ORAL | Status: DC
  Administered 2021-08-12 – 2021-08-13 (×2): 300 mg via ORAL
  Filled 2021-08-12 (×2): qty 1

## 2021-08-12 MED ORDER — INSULIN ASPART 100 UNIT/ML IJ SOLN
0.0000 [IU] | Freq: Three times a day (TID) | INTRAMUSCULAR | Status: DC
  Administered 2021-08-12: 5 [IU] via SUBCUTANEOUS
  Administered 2021-08-13 – 2021-08-14 (×4): 2 [IU] via SUBCUTANEOUS
  Administered 2021-08-14: 1 [IU] via SUBCUTANEOUS
  Administered 2021-08-14: 2 [IU] via SUBCUTANEOUS
  Administered 2021-08-15: 1 [IU] via SUBCUTANEOUS

## 2021-08-12 MED ORDER — ISOSORBIDE MONONITRATE ER 60 MG PO TB24
60.0000 mg | ORAL_TABLET | Freq: Every day | ORAL | Status: DC
  Administered 2021-08-12 – 2021-08-15 (×4): 60 mg via ORAL
  Filled 2021-08-12: qty 1
  Filled 2021-08-12: qty 2
  Filled 2021-08-12 (×2): qty 1

## 2021-08-12 MED ORDER — CLOPIDOGREL BISULFATE 75 MG PO TABS
75.0000 mg | ORAL_TABLET | Freq: Every day | ORAL | Status: DC
  Administered 2021-08-12 – 2021-08-15 (×4): 75 mg via ORAL
  Filled 2021-08-12 (×4): qty 1

## 2021-08-12 MED ORDER — TIZANIDINE HCL 2 MG PO TABS
2.0000 mg | ORAL_TABLET | Freq: Three times a day (TID) | ORAL | Status: DC
  Administered 2021-08-12 – 2021-08-15 (×8): 2 mg via ORAL
  Filled 2021-08-12 (×9): qty 1

## 2021-08-12 MED ORDER — ASPIRIN 81 MG PO TBEC
81.0000 mg | DELAYED_RELEASE_TABLET | Freq: Every day | ORAL | Status: DC
Start: 2021-08-12 — End: 2021-08-15
  Administered 2021-08-12 – 2021-08-15 (×4): 81 mg via ORAL
  Filled 2021-08-12 (×4): qty 1

## 2021-08-12 MED ORDER — ALBUTEROL SULFATE (2.5 MG/3ML) 0.083% IN NEBU
INHALATION_SOLUTION | Freq: Four times a day (QID) | RESPIRATORY_TRACT | Status: DC | PRN
  Administered 2021-08-13 – 2021-08-15 (×2): 2.5 mg via RESPIRATORY_TRACT
  Filled 2021-08-12 (×2): qty 3

## 2021-08-12 MED ORDER — ATORVASTATIN CALCIUM 80 MG PO TABS
80.0000 mg | ORAL_TABLET | Freq: Every day | ORAL | Status: DC
  Administered 2021-08-12 – 2021-08-15 (×4): 80 mg via ORAL
  Filled 2021-08-12: qty 1
  Filled 2021-08-12: qty 2
  Filled 2021-08-12 (×2): qty 1

## 2021-08-12 MED ORDER — ENOXAPARIN SODIUM 30 MG/0.3ML IJ SOSY
30.0000 mg | PREFILLED_SYRINGE | INTRAMUSCULAR | Status: DC
  Administered 2021-08-12: 30 mg via SUBCUTANEOUS
  Filled 2021-08-12: qty 0.3

## 2021-08-12 MED ORDER — INSULIN ASPART 100 UNIT/ML IJ SOLN
10.0000 [IU] | Freq: Once | INTRAMUSCULAR | Status: AC
  Administered 2021-08-12: 10 [IU] via SUBCUTANEOUS

## 2021-08-12 MED ORDER — ACETAMINOPHEN 500 MG PO TABS
1000.0000 mg | ORAL_TABLET | Freq: Four times a day (QID) | ORAL | Status: DC
Start: 2021-08-12 — End: 2021-08-14
  Administered 2021-08-12 – 2021-08-13 (×5): 1000 mg via ORAL
  Filled 2021-08-12 (×5): qty 2

## 2021-08-12 MED ORDER — UMECLIDINIUM BROMIDE 62.5 MCG/ACT IN AEPB
1.0000 | INHALATION_SPRAY | Freq: Every day | RESPIRATORY_TRACT | Status: DC
  Administered 2021-08-13 – 2021-08-15 (×3): 1 via RESPIRATORY_TRACT
  Filled 2021-08-12: qty 7

## 2021-08-12 MED ORDER — DIPHENHYDRAMINE HCL 25 MG PO CAPS
25.0000 mg | ORAL_CAPSULE | Freq: Four times a day (QID) | ORAL | Status: DC | PRN
  Administered 2021-08-13 – 2021-08-14 (×4): 25 mg via ORAL
  Filled 2021-08-12 (×4): qty 1

## 2021-08-12 MED ORDER — TRAZODONE HCL 50 MG PO TABS
100.0000 mg | ORAL_TABLET | Freq: Every day | ORAL | Status: DC
  Administered 2021-08-12 – 2021-08-14 (×3): 100 mg via ORAL
  Filled 2021-08-12 (×3): qty 2

## 2021-08-12 MED ORDER — LACTATED RINGERS IV SOLN: INTRAVENOUS | Status: DC

## 2021-08-12 MED ORDER — DIPHENHYDRAMINE HCL 50 MG/ML IJ SOLN: 25.0000 mg | Freq: Four times a day (QID) | INTRAMUSCULAR | Status: DC | PRN

## 2021-08-12 MED ORDER — VANCOMYCIN HCL 2000 MG/400ML IV SOLN
2000.0000 mg | Freq: Once | INTRAVENOUS | Status: AC
  Administered 2021-08-12: 2000 mg via INTRAVENOUS
  Filled 2021-08-12: qty 400

## 2021-08-12 NOTE — Assessment & Plan Note (Addendum)
Presents hyperglycemic to 455 but without signs of DKA.  UA negative for ketones.  Glucose improved to 397 after receiving NovoLog 10 units in ED.  Unsure of home medications as patient's wife states that his metformin was discontinued. -CBG monitoring 4 times daily, before meals and at bedtime -Sensitive SSI -Hemoglobin A1c

## 2021-08-12 NOTE — Assessment & Plan Note (Addendum)
Continues to be afebrile and hemodynamically stable.  Pain well controlled with increased gabapentin dose. -Wound care following, appreciate assistance -BCx NGx2d, continue to monitor -TED hose -Continue Doxycycline 100mg  BID (day 3/7) -Tylenol 1 g every 6 hours -Continue Gabapentin to 300mg  TID

## 2021-08-12 NOTE — Assessment & Plan Note (Addendum)
Remained stable ranging 130s-150s/80s-90s. Home medications include chlorthalidone 25 mg daily, losartan 100 mg daily, Imdur 60 mg daily. -Continue Imdur 60 mg daily -Vital signs per unit -consider starting coreg in the future given HFpEF

## 2021-08-12 NOTE — Plan of Care (Signed)
  Problem: Skin Integrity: Goal: Risk for impaired skin integrity will decrease Outcome: Not Progressing Note: Patient educated and provided material on how to take care of his skin doing and self as a diabetic.

## 2021-08-12 NOTE — ED Triage Notes (Addendum)
Patient complains of bilateral leg swelling and pain for unknown time. Right leg with increased swelling and redness, patient arrived in [facility]. Poor historian. Denies CP and denies SOB Per spouse patient also wound on right leg and wants that checked

## 2021-08-12 NOTE — ED Provider Notes (Signed)
[facility] EMERGENCY DEPARTMENT Provider Note   CSN: [id number] Arrival date & time: 08/12/21  0741     History  Chief Complaint  Patient presents with   Leg Swelling   Wound Check    Craig Knight is a 56 y.o. 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    Presents to the emergency department with a chief complaint of bilateral lower leg swelling and bilateral lower leg wounds.  Patient reports that he has had swelling to lower extremities over the last month.  Swelling has gotten progressively worse.  Swelling to right lower extremity is worse than left.  Patient's wife reports that 2 weeks prior he started having wounds to bilateral lower legs.  He has a large wound to the posterior aspect of right lower leg as well as multiple smaller wounds/nodules to anterior left lower leg.  Wife has been applying hydroperoxide to wounds on a daily basis over the last 2 weeks.  She is concerned that wounds have not healed and have been getting worse.  She does endorse seeing purulent discharge over the last 2 days.  Patient's wife reports that his nephrologist started him on Lasix 1 month prior.  Patient has been taking Lasix as prescribed.  Patient denies any fever, chills, numbness, weakness, color change, rash, chest pain, shortness of breath, hemoptysis, abdominal pain, nausea, vomiting.   Wound Check Pertinent negatives include no chest pain, no abdominal pain, no headaches and no shortness of breath.       Home Medications Prior to Admission medications   Medication Sig Start Date End Date Taking? Authorizing Provider  acetaminophen (TYLENOL) 325 MG tablet Take 2 tablets (650 mg total) by mouth every 4 (four) hours as needed for mild pain (or temp > 37.5 C (99.5 F)). 10/12/20   Angiulli, Lavon Paganini, PA-C   albuterol (VENTOLIN HFA) 108 (90 Base) MCG/ACT inhaler Inhale 2 puffs into the lungs every 6 (six) hours as needed for wheezing or shortness of breath. 10/12/20   Angiulli, Lavon Paganini, PA-C  aspirin EC 81 MG tablet Take 1 tablet (81 mg total) by mouth daily. 04/16/18   Carmin Muskrat, MD  atorvastatin (LIPITOR) 80 MG tablet Take by mouth. 10/12/20   [provider]  chlorthalidone (HYGROTON) 25 MG tablet Take 1 tablet (25 mg total) by mouth daily. 10/12/20 12/01/21  Angiulli, Lavon Paganini, PA-C  clopidogrel (PLAVIX) 75 MG tablet Take 1 tablet (75 mg total) by mouth daily. 10/14/20   Angiulli, Lavon Paganini, PA-C  Fluticasone-Umeclidin-Vilant (TRELEGY ELLIPTA) 100-62.5-25 MCG/INH AEPB Inhale 1 puff into the lungs daily. 10/12/20   Angiulli, Lavon Paganini, PA-C  furosemide (LASIX) 20 MG tablet Take by mouth. 05/24/21 05/24/22  [provider]  gabapentin (NEURONTIN) 300 MG capsule Take by mouth. 10/12/20   [provider]  hydrOXYzine (ATARAX) 25 MG tablet Take by mouth.    [provider]  ibuprofen (ADVIL) 800 MG tablet Take 800 mg by mouth 3 (three) times daily. 06/06/21   [provider]  isosorbide mononitrate (IMDUR) 60 MG 24 hr tablet Take 60 mg by mouth daily. 11/09/20   [provider]  losartan (COZAAR) 100 MG tablet Take 100 mg by mouth daily. 10/11/20   [provider]  losartan (COZAAR) 50 MG tablet Take by mouth. 10/11/20   [provider]  metFORMIN (GLUCOPHAGE) 500 MG  tablet Take by mouth.    [provider]  nitroGLYCERIN (NITROSTAT) 0.4 MG SL tablet Place 1 tablet (0.4 mg total) under the tongue every 5 (five) minutes as needed for chest pain. 10/12/20   Angiulli, Lavon Paganini, PA-C  sertraline (ZOLOFT) 50 MG tablet Take by mouth. 11/26/20   [provider]  tiZANidine (ZANAFLEX) 2 MG tablet Take 1 tablet (2 mg total) by mouth 3 (three) times daily. 10/12/20   Angiulli, Lavon Paganini, PA-C  traZODone (DESYREL) 100 MG tablet Take by  mouth. 11/21/20   [provider]      Allergies    Dust mite extract and Lisinopril    Review of Systems   Review of Systems  Constitutional:  Negative for chills and fever.  Respiratory:  Negative for shortness of breath.   Cardiovascular:  Positive for leg swelling. Negative for chest pain.  Gastrointestinal:  Negative for abdominal pain, nausea and vomiting.  Musculoskeletal:  Negative for back pain and neck pain.  Skin:  Positive for wound. Negative for color change, pallor and rash.  Neurological:  Negative for dizziness, syncope, light-headedness and headaches.  Psychiatric/Behavioral:  Negative for confusion.     Physical Exam Updated Vital Signs BP (!) 151/99   Pulse 78   Temp 98.7 F (37.1 C) (Oral)   Resp 18   SpO2 97%  Physical Exam Vitals and nursing note reviewed.  Constitutional:      General: He is not in acute distress.    Appearance: He is not ill-appearing, toxic-appearing or diaphoretic.  HENT:     Head: Normocephalic.  Eyes:     General: No scleral icterus.       Right eye: No discharge.        Left eye: No discharge.  Cardiovascular:     Rate and Rhythm: Normal rate.     Pulses:          Dorsalis pedis pulses are detected w/ Doppler on the right side and detected w/ Doppler on the left side.  Pulmonary:     Effort: Pulmonary effort is normal.  Musculoskeletal:     Comments: +2 edema to right lower extremity from foot to thigh.  +1 edema from left ankle to left thigh.  Patient has multiple small wounds and nodules to anterior aspect of left lower leg.  No surrounding erythema, warmth, or purulent discharge.  Wound to posterior right lower leg with surrounding erythema.  No purulent discharge.  Pulse, motor, and sensation intact distally.  Skin:    General: Skin is warm and dry.  Neurological:     General: No focal deficit present.     Mental Status: He is alert.  Psychiatric:        Behavior: Behavior is cooperative.     ED  Results / Procedures / Treatments   Labs (all labs ordered are listed, but only abnormal results are displayed) Labs Reviewed  COMPREHENSIVE METABOLIC PANEL - Abnormal; Notable for the following components:      Result Value   Sodium 133 (*)    Chloride 92 (*)    Glucose, Bld 455 (*)    BUN 57 (*)    Creatinine, Ser 2.99 (*)    AST 12 (*)    GFR, Estimated 24 (*)    All other components within normal limits  CBC - Abnormal; Notable for the following components:   Hemoglobin 12.8 (*)    MCV 79.0 (*)    MCH 25.8 (*)  All other components within normal limits  URINALYSIS, ROUTINE W REFLEX MICROSCOPIC    EKG None  Radiology No results found.  Procedures Procedures    Medications Ordered in ED Medications - No data to display  ED Course/ Medical Decision Making/ A&P Clinical Course as of 08/12/21 1250  Sun Aug 12, 2021  1247 I spoke with resident from family medicine medicine team who will see the patient for admission. [PB]    Clinical Course User Index [PB] Loni Beckwith, PA-C                           Medical Decision Making Amount and/or Complexity of Data Reviewed Labs: ordered. Radiology: ordered.  Risk Prescription drug management. Decision regarding hospitalization.   Alert 55 year old male in no acute distress, nontoxic-appearing.  Presents emergency department chief complaint of bilateral leg swelling and wounds.  Information is obtained from patient and patient's wife at bedside.  Past medical records were reviewed including previous provider notes from specialist, labs, and imaging.  Patient has medical history as outlined in HPI which complicates his care.  Per chart review patient was noted to have bilateral lower leg edema when visiting his neurologist on 05/2021.  Ultrasound imaging of bilateral lower extremities did not show any evidence of DVT.    With swelling to bilateral lower extremities and wounds differential diagnosis includes  but is not limited to DVT, cellulitis, acute CHF exacerbation, chronic venous stasis.  Will obtain DVT study, CMP, CBC, BNP, x-ray imaging.  I personally viewed interpret patient's x-ray imaging.  Agree with radiology interpretation of soft tissue swelling with no acute osseous abnormality.  Ultrasound imaging did not show any signs of DVT bilaterally.  I personally viewed interpret patient's lab results.  Pertinent findings include: -Glucose 455 -BNP 6.5 -Urinalysis shows no signs of infection, no ketones.  Patient noted to have elevated glucose with no signs of DKA.  Was initially given 10 units of insulin while fluids were being held due to concern for possible volume overload status.  500 cc fluid bolus was initiated after BMP was obtained.  With wound and marked swelling to right lower extremity concern for cellulitis.  Will initiate antibiotics treatment with Ancef.  Patient has acute on chronic kidney injury with creatinine at 2.99.  His baseline appears to be around 2 however last creatinine obtained at beginning of May was 2.5  With acute on chronic kidney injury, cellulitis, and hyperglycemia we will consult for admission.  I spoke to resident with family medicine who will see the patient for admission.  Patient care was discussed with attending physician Dr. Alvino Chapel.        Final Clinical Impression(s) / ED Diagnoses Final diagnoses:  Cellulitis of right lower extremity  AKI (acute kidney injury) ([place])  Hyperglycemia    Rx / DC Orders ED Discharge Orders     None         Loni Beckwith, PA-C 08/12/21 1254    Davonna Belling, MD 08/13/21 1553

## 2021-08-12 NOTE — Hospital Course (Addendum)
Craig Knight is a 56 y.o.male with a history of multiple CVAs with residual right hemiparesis, chronic diastolic CHF, HTN, COPD, OSA on CPAP, T2DM, CKD 3B who was admitted to the [facility] at [facility] for purulent cellulitis of right calf and AKI. His hospital course is detailed below:  Cellulitis of RLE Presented with purulent open lesion on right calf. Initially received Ancef in the ED which was then transitioned to Vancomycin and finally Doxycycline for MRSA coverage given purulence. Pain control with Tylenol and Gabapentin and received wound care during hospitalization. Discharged with advice for antibiotic coverage of total 7 days   AKI  BLE edema Presented with AKI with elevated creatinine at 2.99. Suspected to be initially prerenal and due to nephrotoxic agents but showed signs of fluid overload in lower extremities and treated with lasix. Losartan and Chlorthalidone were held briefly and nephrotoxic agents avoided. Echo showed LVEF 65-70% w/ LVH and G1DD. Creatinine improved to establish new baseline of ***.  Uncontrolled T2DM Presented hyperglycemic to 455 and A1c of 11.8. CBGs monitored during hospitalization and restarted on home Tradjenta '5mg'$  daily.    Other chronic conditions were medically managed with home medications and formulary alternatives as necessary (OSA, CVA history, HLD, COPD)  PCP Follow-up Recommendations:  Consider further assessment and treatment for psoriasis (right calf, abdomen)

## 2021-08-12 NOTE — Assessment & Plan Note (Addendum)
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  Not responsive to triamcinolone.  May consider high potency topical steroid for psoriatic patches. - Benadryl 25 mg every 6 hours as needed for itching

## 2021-08-12 NOTE — Progress Notes (Signed)
VASCULAR LAB    Bilateral lower extremity venous duplex has been performed.  See CV proc for preliminary results.  Messaged results to Dr. Alvino Chapel via secure chat  Sharion Dove, RVT 08/12/2021, 12:08 PM

## 2021-08-12 NOTE — ED Notes (Signed)
Patient crying in pain about right leg, PA notified about patient requesting pain meds

## 2021-08-12 NOTE — Progress Notes (Signed)
Patient refused CPAP.

## 2021-08-12 NOTE — Progress Notes (Signed)
Pharmacy Antibiotic Note  Craig Knight is a 56 y.o. male for which pharmacy has been consulted for vancomycin dosing for cellulitis.  Patient with a history of CKD stage IIIb, multiple CVAs, chronic diastolic CHF, HTN, COPD, OSA on CPAP, T2DM. Patient presenting with bilateral lower leg swelling and bilateral lower leg wounds.  SCr 2.99 - above baseline WBC 9  Plan: Vancomycin 2000 mg once, subsequent dosing as indicated per random vancomycin level until renal function stable and/or improved, at which time scheduled dosing can be considered Trend WBC, Fever, Renal function, & Clinical course F/u cultures, clinical course, WBC, fever De-escalate when able     Temp (24hrs), Avg:98 F (36.7 C), Min:97.2 F (36.2 C), Max:98.7 F (37.1 C)  Recent Labs  Lab 08/12/21 0800  WBC 9.0  CREATININE 2.99*    CrCl cannot be calculated (Unknown ideal weight.).    Allergies  Allergen Reactions   Dust Mite Extract Other (See Comments)    Sneezing   Lisinopril Cough    Antimicrobials this admission: Ancef x 1 in ED 6/18 vancomycin 6/18 >>   Microbiology results: Pending  Thank you for allowing pharmacy to be a part of this patient's care.  Lorelei Pont, PharmD, BCPS 08/12/2021 3:26 PM ED Clinical Pharmacist -  [phone]

## 2021-08-12 NOTE — Assessment & Plan Note (Addendum)
Creatinine improved: 2.99>2.61 with baseline 1.9-2.5. Suspect related to nephrotoxic agents, possibly also volume overloaded given edema.  -Hold home agents: chlorthalidone, losartan -Strict I/Os, daily weights -Outpatient follow-up with nephrology for proteinuria work-up

## 2021-08-12 NOTE — ED Notes (Signed)
Called 9M for purple man.  Awaiting p[urple man

## 2021-08-12 NOTE — H&P (Addendum)
Hospital Admission History and Physical Service Pager: [phone]  Patient name: Craig Knight Medical record number: [id number] Date of Birth: 1965-06-16 Age: 56 y.o. Gender: male  Primary Care Provider: Carrolyn Meiers, MD Consultants: Wound care Code Status: Full Preferred Emergency Contact:  Contact Information     Name Relation Home Work Mobile   [place] Spouse   [phone]   [facility] Mother [phone]     Maveric, Debono Daughter   [phone]   DAXEN, LANUM Daughter [phone]          Chief Complaint: Right lower extremity draining wound  Assessment and Plan: [name] is a 56 y.o. male presenting with purulent wound on right calf with edema, hyperglycemia, and AKI.   * Cellulitis of right lower extremity Open lesions on right calf as noted in photo with purulent drainage, highly suspicious for Staph aureus. Status post Ancef in the ED. Presents afebrile and without markers of leukocytosis or systemic infection although extremely uncomfortable during exam. RLE XR notable for diffuse soft tissue swelling without acute osseous abnormality. Ultrasound negative for DVTs. -Admit to [facility], attending Dr. 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  Not responsive to triamcinolone.  May consider high potency topical steroid for psoriatic patches. - Benadryl 25 mg every 6 hours as needed for itching  AKI (acute kidney injury) ([place]) Creatinine 2.99 with baseline 1.9-2.5. Continued to take losartan 100 mg, chlorthalidone, ibuprofen, high doses of Lasix for the past several weeks. Suspect related to prerenal and nephrotoxic agents.  Status post NS bolus of 500 mL.  Was also being worked up for proteinuria.  Renal ultrasound was unremarkable, elevated kappa light chains and  kappa/lambda ratio. -Hold home nephrotoxic and diuretic agents: Lasix, chlorthalidone, losartan -Echo to guide volume status and heart function -A.m. BMP -urine urea, creatinine -Strict I/Os, daily weights -Outpatient follow-up with nephrology for proteinuria work-up  DM type 2 (diabetes mellitus, type 2) ([place]) Presents hyperglycemic to 455 but without signs of DKA.  UA negative for ketones.  Glucose improved to 397 after receiving NovoLog 10 units in ED.  Unsure of home medications as patient's wife states that his metformin was discontinued. -CBG monitoring 4 times daily, before meals and at bedtime -Sensitive SSI -Hemoglobin A1c  Hypertension BP range 130-150s/90-120s.  Has not received blood pressure medications today.  Home medications include chlorthalidone 25 mg daily, losartan 100 mg daily, Imdur 60 mg daily. -Continue Imdur 60 mg daily -Hydralazine 25 mg every 6 hours as needed for SBP>160, DBP>100 -Vital signs per unit  Chronic conditions, stable: OSA: CPAP, per respiratory Hx of CVAs: Aspirin 81 mg daily, Plavix 75 mg daily HLD: Lipitor 80 mg daily COPD: Albuterol every 6 hours as needed, Breo and Incruse Ellipta 1 puff daily  FEN/GI: Carb modified VTE Prophylaxis: Lovenox  Disposition: MedSurg  History of Present Illness:  Craig Knight is a 56 y.o. male presenting with draining right lower extremity wound.  He notes rash on his R leg started about 1-2 weeks ago. Over the past few days, it was hurting him more and he has been having trouble sleeping. Pus started coming out 2 days ago but more started draining yesterday.  Today, it is draining a little less. They wonder if it's related to his wheelchair at all.  He also "broke out" all over his left leg 1 month ago.  They have tried Triamcinolone cream x 3 weeks which has not helped. He endorses picking at the skin frequently, as the rash on the L leg is itchy.  Of note, he's had bilateral lower leg swelling for the  past ~2 months.  States they have been using ibuprofen and tylenol for pain.  He has additionally been taking Lasix 80 mg daily for the last 3 weeks as advised by his doctor.  In the ED, presented hyperglycemic to 455 with creatinine of 2.99, no signs of DKA.  Received NovoLog 10 units which improved CBG to 397.  Has remained hemodynamically stable in the ED with the exception of hypertensive blood pressures.  He has been uncomfortable with his wounds.  Received single dose of Ancef.  Review Of Systems: Per HPI  Pertinent Past Medical History: CKD stage IIIb, multiple CVAs (residual R hemiparesis), chronic diastolic CHF, HTN, COPD, OSA on CPAP, T2DM Remainder reviewed in history tab.   Pertinent Past Surgical History: Lipoma removal Remainder reviewed in history tab.  Pertinent Social History: Tobacco use: Former (prior to stroke) Alcohol use: None currently, fifth of liquor daily formerly (prior to stroke) Other Substance use: Marijuana in the past  Pertinent Family History: Family history of stroke, hypertension, heart attacks Remainder reviewed in history tab.   Important Outpatient Medications: Tylenol, aspirin, Lipitor, chlorthalidone, Plavix, Lasix, gabapentin, ibuprofen, Imdur, losartan, tizanidine, trazodone, albuterol, Trelegy Remainder reviewed in medication history.   Objective: BP 121/81   Pulse 90   Temp 98.2 F (36.8 C)   Resp 19   Ht '5\' 7"'$  (1.702 m)   Wt 108 kg   SpO2 95%   BMI 37.29 kg/m  Exam: General: Awake, alert and appropriately responsive in NAD HEENT: NCAT. EOMI, PERRL. Oropharynx clear. MMM.  Neck: Normal ROM Chest: Expiratory wheezing bilaterally, normal WOB.  Heart: RRR, normal S1, S2. No murmur appreciated Abdomen: Soft, obese abdomen. +hepatomegaly. Normoactive bowel sounds.  Extremities: +2 pitting edema of BLEs up to the thighs MSK: Normal bulk and tone Neuro: Appropriately responsive to stimuli.  Right hemiparesis Skin: Scattered  hyperpigmented and erythematous papules and nodules on the left lower extremity, R posterior shoulder, and abdomen concerning for prurigo nodularis; flaky, circumscribed silvery plaque on left lower abdomen consistent with psoriasis; several open lesions without notable drainage on the left lower calf        Labs:  CBC BMET  Recent Labs  Lab 08/12/21 0800  WBC 9.0  HGB 12.8*  HCT 39.2  PLT 223   Recent Labs  Lab 08/12/21 0800  NA 133*  K 3.7  CL 92*  CO2 29  BUN 57*  CREATININE 2.99*  GLUCOSE 455*  CALCIUM 9.9     Urinalysis    Component Value Date/Time   COLORURINE STRAW (A) 08/12/2021 0757   APPEARANCEUR CLEAR 08/12/2021 0757   LABSPEC 1.016 08/12/2021 0757   PHURINE 5.0 08/12/2021 0757   GLUCOSEU >=500 (A) 08/12/2021 0757   HGBUR NEGATIVE 08/12/2021 0757   BILIRUBINUR NEGATIVE 08/12/2021 0757   KETONESUR NEGATIVE 08/12/2021 0757   PROTEINUR NEGATIVE 08/12/2021 0757   UROBILINOGEN 0.2 09/15/2013 1011   NITRITE NEGATIVE 08/12/2021 0757   LEUKOCYTESUR NEGATIVE 08/12/2021 0757   BNP: 6.5  Imaging Studies Performed: XR right tibial/fibula: Diffuse soft tissue swelling without acute osseous abnormality.   Fenton Malling Nicole Kindred) Madison Hickman, DO 08/12/2021, 3:45 PM PGY-1, [place] Intern pager: [phone], text pages welcome Secure chat group [place] Upper-Level Resident  Addendum   I have independently interviewed and examined the patient. I have discussed the above with Dr. Madison Hickman and agree with the documented plan. My edits for correction/addition/clarification are included above. Please see any attending notes.   Alcus Dad, MD PGY-2, [facility] 08/12/2021 3:55 PM  [place] Service pager: [phone] (text pages welcome through [place])

## 2021-08-13 ENCOUNTER — Encounter (HOSPITAL_COMMUNITY): Payer: Self-pay | Admitting: Student

## 2021-08-13 ENCOUNTER — Observation Stay (HOSPITAL_COMMUNITY): Payer: Medicare Other

## 2021-08-13 DIAGNOSIS — R600 Localized edema: Secondary | ICD-10-CM

## 2021-08-13 DIAGNOSIS — I5030 Unspecified diastolic (congestive) heart failure: Secondary | ICD-10-CM | POA: Diagnosis not present

## 2021-08-13 DIAGNOSIS — L03115 Cellulitis of right lower limb: Secondary | ICD-10-CM | POA: Diagnosis not present

## 2021-08-13 DIAGNOSIS — E876 Hypokalemia: Secondary | ICD-10-CM | POA: Insufficient documentation

## 2021-08-13 LAB — GLUCOSE, CAPILLARY
Glucose-Capillary: 173 mg/dL — ABNORMAL HIGH (ref 70–99)
Glucose-Capillary: 181 mg/dL — ABNORMAL HIGH (ref 70–99)
Glucose-Capillary: 181 mg/dL — ABNORMAL HIGH (ref 70–99)
Glucose-Capillary: 176 mg/dL — ABNORMAL HIGH (ref 70–99)

## 2021-08-13 LAB — BASIC METABOLIC PANEL
Anion gap: 12 (ref 5–15)
Anion gap: 11 (ref 5–15)
BUN: 47 mg/dL — ABNORMAL HIGH (ref 6–20)
BUN: 42 mg/dL — ABNORMAL HIGH (ref 6–20)
CO2: 27 mmol/L (ref 22–32)
CO2: 26 mmol/L (ref 22–32)
Calcium: 9.5 mg/dL (ref 8.9–10.3)
Calcium: 9.7 mg/dL (ref 8.9–10.3)
Chloride: 98 mmol/L (ref 98–111)
Chloride: 102 mmol/L (ref 98–111)
Creatinine, Ser: 2.61 mg/dL — ABNORMAL HIGH (ref 0.61–1.24)
Creatinine, Ser: 2.37 mg/dL — ABNORMAL HIGH (ref 0.61–1.24)
GFR, Estimated: 28 mL/min — ABNORMAL LOW (ref >60)
GFR, Estimated: 31 mL/min — ABNORMAL LOW (ref >60)
Glucose, Bld: 158 mg/dL — ABNORMAL HIGH (ref 70–99)
Glucose, Bld: 197 mg/dL — ABNORMAL HIGH (ref 70–99)
Potassium: 2.9 mmol/L — ABNORMAL LOW (ref 3.5–5.1)
Potassium: 3.7 mmol/L (ref 3.5–5.1)
Sodium: 137 mmol/L (ref 135–145)
Sodium: 139 mmol/L (ref 135–145)

## 2021-08-13 LAB — ECHOCARDIOGRAM COMPLETE
Area-P 1/2: 4.88 cm2
Height: 67 in
MV VTI: 5.68 cm2
S' Lateral: 2.3 cm
Single Plane A4C EF: 64.2 %
Weight: 3809.55 oz

## 2021-08-13 LAB — TSH: TSH: 2.417 u[IU]/mL (ref 0.350–4.500)

## 2021-08-13 MED ORDER — GABAPENTIN 300 MG PO CAPS
300.0000 mg | ORAL_CAPSULE | Freq: Three times a day (TID) | ORAL | Status: DC
  Administered 2021-08-13 – 2021-08-15 (×6): 300 mg via ORAL
  Filled 2021-08-13 (×6): qty 1

## 2021-08-13 MED ORDER — ENOXAPARIN SODIUM 60 MG/0.6ML IJ SOSY
55.0000 mg | PREFILLED_SYRINGE | INTRAMUSCULAR | Status: DC
  Administered 2021-08-13 – 2021-08-15 (×3): 55 mg via SUBCUTANEOUS
  Filled 2021-08-13 (×3): qty 0.6

## 2021-08-13 MED ORDER — OXYCODONE HCL 5 MG PO TABS
2.5000 mg | ORAL_TABLET | Freq: Once | ORAL | Status: AC
  Administered 2021-08-13: 2.5 mg via ORAL
  Filled 2021-08-13: qty 1

## 2021-08-13 MED ORDER — DOXYCYCLINE HYCLATE 100 MG PO TABS
100.0000 mg | ORAL_TABLET | Freq: Two times a day (BID) | ORAL | Status: DC
  Administered 2021-08-13 – 2021-08-15 (×4): 100 mg via ORAL
  Filled 2021-08-13 (×4): qty 1

## 2021-08-13 MED ORDER — LINAGLIPTIN 5 MG PO TABS
5.0000 mg | ORAL_TABLET | Freq: Every day | ORAL | Status: DC
  Administered 2021-08-13 – 2021-08-15 (×3): 5 mg via ORAL
  Filled 2021-08-13 (×3): qty 1

## 2021-08-13 MED ORDER — POTASSIUM CHLORIDE CRYS ER 20 MEQ PO TBCR
40.0000 meq | EXTENDED_RELEASE_TABLET | ORAL | Status: AC
  Administered 2021-08-13 (×2): 40 meq via ORAL
  Filled 2021-08-13 (×2): qty 2

## 2021-08-13 MED ORDER — PERFLUTREN LIPID MICROSPHERE
1.0000 mL | INTRAVENOUS | Status: AC | PRN
  Administered 2021-08-13: 2 mL via INTRAVENOUS

## 2021-08-13 MED ORDER — FUROSEMIDE 10 MG/ML IJ SOLN
20.0000 mg | Freq: Once | INTRAMUSCULAR | Status: AC
  Administered 2021-08-13: 20 mg via INTRAVENOUS
  Filled 2021-08-13: qty 2

## 2021-08-13 MED ORDER — POTASSIUM CHLORIDE CRYS ER 20 MEQ PO TBCR
40.0000 meq | EXTENDED_RELEASE_TABLET | Freq: Once | ORAL | Status: AC
  Administered 2021-08-13: 40 meq via ORAL
  Filled 2021-08-13: qty 2

## 2021-08-13 MED ORDER — VENLAFAXINE HCL ER 75 MG PO CP24
75.0000 mg | ORAL_CAPSULE | Freq: Every day | ORAL | Status: DC
  Administered 2021-08-13 – 2021-08-15 (×3): 75 mg via ORAL
  Filled 2021-08-13 (×3): qty 1

## 2021-08-13 NOTE — Plan of Care (Signed)
  Problem: Fluid Volume: Goal: Ability to maintain a balanced intake and output will improve Outcome: Progressing   Problem: Nutritional: Goal: Maintenance of adequate nutrition will improve Outcome: Progressing   Problem: Elimination: Goal: Will not experience complications related to bowel motility Outcome: Progressing Goal: Will not experience complications related to urinary retention Outcome: Progressing

## 2021-08-13 NOTE — Progress Notes (Signed)
FPTS Interim Progress Note  S:Went to bedside to check on patient, he was in the process of getting an IV placed. Denies any concerns at this time. I informed him to let us know if he needs anything.   O: BP 115/70 (BP Location: Left Arm)   Pulse 73   Temp 98 F (36.7 C) (Oral)   Resp 18   Ht '5\' 7"'$  (1.702 m)   Wt 108 kg   SpO2 96%   BMI 37.29 kg/m   General: Patient well-appearing, in no acute distress. Resp: normal work of breathing  Ext: 2+ pitting edema upon LE bilaterally, hyperpigmented nodules scatted across LE  Psych: mood appropriate   A/P: Vitals stable and orders reviewed. Upon speaking with the nurse, she says that he is in a significant amount of pain although when I examined patient did not express this to me. I explained to nurse that this is likely secondary to cellulitis and LE edema. Awaiting echo to monitor cardiac function. Already has tylenol 1 g but still in pain, will order oxycodone 2.5 mg one time dose. Continue remainder of plan per day team.   Donney Dice, DO 08/13/2021, 3:22 AM PGY-2, [facility] Service pager [phone]

## 2021-08-13 NOTE — Progress Notes (Signed)
PT Cancellation Note  Patient Details Name: DONZEL ROMACK MRN: [id number] DOB: 1966-01-18   Cancelled Treatment:    Reason Eval/Treat Not Completed: Patient at procedure or test/unavailable. Will re-attempt later as able, if patient available.    Zorian Gunderman 08/13/2021, 2:31 PM

## 2021-08-13 NOTE — Assessment & Plan Note (Addendum)
Pitting edema of lower extremity. Suspect volume overloaded. Home medication regimen includes lasix and chlorthalidone. -Lasix '20mg'$  IV, monitor response

## 2021-08-13 NOTE — Consult Note (Addendum)
[facility] Nurse Consult Note: Reason for Consult: Consult requested for right leg wound.  Performed remotely after review of progress notes and photos in the EMR. X-ray does not indicate abscess or osteomyelitis.  Wound type: Right leg with previously noted cellulitis, which has evolved into full thickness patchy areas of skin loss to right posterior calf. Generalized edema and erythremia and mod amt tan drainage. Dressing procedure/placement/frequency: Topical treatment orders provided for bedside nurses to perform as follows to promote healing: Apply xerform gauze to right posterior leg Q day, then cover with ABD pad and kerlex. Please re-consult if further assistance is needed.  Thank-you,  Julien Girt MSN, [place], [place], [place], [place]

## 2021-08-13 NOTE — Progress Notes (Signed)
Daily Progress Note Intern Pager: [phone]  Patient name: Craig Knight Medical record number: [id number] Date of birth: 05-Feb-1966 Age: 56 y.o. Gender: male  Primary Care Provider: Carrolyn Meiers, MD Consultants: Wound care Code Status: Full  Pt Overview and Major Events to Date:  6/18: Admitted  Assessment and Plan: Craig Knight is a 56 y.o. male who presented with purulent wound on right calf with edema suspected to be cellulitis and currently being treated for uncontrolled diabetes and AKI. Pertinent PMH/PSH includes CKD 3B, CVAs with residual right hemiparesis, HFpEF, HTN, COPD, OSA on CPAP, T2DM.  * Cellulitis of right lower extremity Continues to be afebrile and hemodynamically stable. Pain poorly controlled. -Wound care following, appreciate assistance -BCx NGx24hrs, continue to monitor -TED hose -Transition abx regimen to Doxycycline '100mg'$  BID (day 2/7) -Tylenol 1 g every 6 hours -Increase Gabapentin to '300mg'$  TID  Bilateral lower extremity edema Pitting edema of lower extremity. Suspect volume overloaded. Home medication regimen includes lasix and chlorthalidone. -Lasix '20mg'$  IV, monitor response  Hypokalemia K low at 2.9. Suspect related to recent insulin use. 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  Not responsive to triamcinolone.  May consider high potency topical steroid for psoriatic patches. Received excellent benefit from Benadryl. - Benadryl 25 mg every 6 hours as needed for itching  AKI (acute kidney injury) ([place]) Creatinine improved: 2.99>2.61 with baseline 1.9-2.5. Suspect related to nephrotoxic agents, possibly also volume overloaded given edema.  -Hold home agents: chlorthalidone, losartan -Strict I/Os, daily weights -Outpatient follow-up with nephrology for proteinuria work-up  DM type 2 (diabetes mellitus, type  2) (HCC) CBG 173, A1c 11.8.  Received 15 units SAI. -CBG monitoring 4 times daily, before meals and at bedtime -sSSI -Restart Tradjenta  Hypertension Improved overnight. Home medications include chlorthalidone 25 mg daily, losartan 100 mg daily, Imdur 60 mg daily. -Continue Imdur 60 mg daily -Vital signs per unit -consider starting coreg in the future given HFpEF  FEN/GI: Carb modified PPx: Lovenox Dispo:Pending PT recommendations  pending clinical improvement . Barriers include cellulitis and AKI improvement.   Subjective:  States he has been doing better but still has the pain. States the pain is achy, shooting pain 12/10 and cannot describe the pain any further than PAIN. Tylenol has not benefited much. Gabapentin has helped at home. States his itching has greatly improved. Eating and drinking well.  Objective: Temp:  [97.2 F (36.2 C)-99 F (37.2 C)] 99 F (37.2 C) (06/19 1001) Pulse Rate:  [69-107] 107 (06/19 1001) Resp:  [11-19] 18 (06/19 1001) BP: (115-178)/(70-129) 135/102 (06/19 1001) SpO2:  [92 %-98 %] 92 % (06/19 1001) Weight:  [108 kg] 108 kg (06/19 0400) Physical Exam: General: awake, alert, NAD Cardiovascular: RRR, no murmurs auscultated Respiratory: CTAB, normal WOB Abdomen: soft, obese abdomen, normoactive BS Extremities: +2 pitting edema of BLEs up to knee; right calf wound non-purulent; prurigo nodularis on BLEs L>R  Laboratory: Most recent CBC Lab Results  Component Value Date   WBC 9.0 08/12/2021   HGB 12.8 (L) 08/12/2021   HCT 39.2 08/12/2021   MCV 79.0 (L) 08/12/2021   PLT 223 08/12/2021   Most recent BMP    Latest Ref Rng & Units 08/13/2021    4:45 AM  BMP  Glucose 70 - 99 mg/dL 158   BUN 6 - 20 mg/dL 47   Creatinine 0.61 - 1.24 mg/dL  2.61   Sodium 135 - 145 mmol/L 137   Potassium 3.5 - 5.1 mmol/L 2.9   Chloride 98 - 111 mmol/L 98   CO2 22 - 32 mmol/L 27   Calcium 8.9 - 10.3 mg/dL 9.5    A1c: 11.8 TSH: 2.4 (nl)  Imaging/Diagnostic  Tests: No recent imaging results. Wells Guiles, DO 08/13/2021, 12:03 PM  PGY-1, [place] Intern pager: [phone], text pages welcome Secure chat group [place]

## 2021-08-13 NOTE — Progress Notes (Signed)
Notified MD about 10/10 pain, despite giving scheduled Tylenol 1000 mg tablet. Repositioning and elevating lower extremities did not help decrease the patient's pain. Patient was experiencing right lower extremity and right upper extremity severe spasms. Requested for MD to come see patient, MD deferred.

## 2021-08-13 NOTE — Care Management Obs Status (Signed)
[place] NOTIFICATION   Patient Details  Name: Craig Knight MRN: [id number] Date of Birth: [birth year]   Medicare Observation Status Notification Given:  Yes    Tom-Johnson, Renea Ee, RN 08/13/2021, 2:34 PM

## 2021-08-13 NOTE — Evaluation (Signed)
Physical Therapy Evaluation Patient Details Name: Craig Knight MRN: [id number] DOB: December 01, 1965 Today's Date: 08/13/2021  History of Present Illness  56 Y/O with PMHx of CVA, COPD, DM2, HTN, CVA, and OSA was brought in for B/L LE swelling, pain of his right LE, and generalized itchy nodular rash, all ongoing x 1 month. He endorsed a painful wound on his right calf, which started oozing clear liquid a few days ago.   Clinical Impression  Patient received on [facility] with RN and NT attempting to get patient up from [facility]. Family in room as well. Patient required max +2 assist to stand from [facility] and to pivot back to bed. Max +2 for sit to supine and positioning. Patient will continue to benefit from skilled PT to improve functional independence and safety with mobility.        Recommendations for follow up therapy are one component of a multi-disciplinary discharge planning process, led by the attending physician.  Recommendations may be updated based on patient status, additional functional criteria and insurance authorization.  Follow Up Recommendations Skilled nursing-short term rehab (<3 hours/day)    Assistance Recommended at Discharge Frequent or constant Supervision/Assistance  Patient can return home with the following  A lot of help with walking and/or transfers;A lot of help with bathing/dressing/bathroom;Help with stairs or ramp for entrance;Assist for transportation;Assistance with cooking/housework    Equipment Recommendations None recommended by PT  Recommendations for Other Services       Functional Status Assessment Patient has had a recent decline in their functional status and demonstrates the ability to make significant improvements in function in a reasonable and predictable amount of time.     Precautions / Restrictions Precautions Precautions: Fall Restrictions Weight Bearing Restrictions: No      Mobility  Bed Mobility Overal bed mobility: Needs Assistance Bed  Mobility: Sit to Supine       Sit to supine: Max assist, +2 for physical assistance   General bed mobility comments: max cues to get back in bed, assist as able, positioning    Transfers Overall transfer level: Needs assistance Equipment used: Rolling walker (2 wheels) Transfers: Sit to/from Stand, Bed to chair/wheelchair/BSC Sit to Stand: Max assist, +2 physical assistance Stand pivot transfers: Max assist, +2 physical assistance         General transfer comment: patient once standing, was unable to pivot or take a step. R LE began trembling and patient required max +2 assist to get hips turned and onto bed safely.    Ambulation/Gait               General Gait Details: unable  Stairs            Wheelchair Mobility    Modified Rankin (Stroke Patients Only)       Balance Overall balance assessment: Needs assistance Sitting-balance support: Feet supported Sitting balance-Leahy Scale: Fair     Standing balance support: Single extremity supported, During functional activity, Reliant on assistive device for balance Standing balance-Leahy Scale: Poor                               Pertinent Vitals/Pain Pain Assessment Pain Assessment: Faces Faces Pain Scale: Hurts little more Pain Location: L calf Pain Descriptors / Indicators: Discomfort, Sore Pain Intervention(s): Monitored during session, Premedicated before session, Repositioned    Home Living Family/patient expects to be discharged to:: Private residence Living Arrangements: Spouse/significant other;Children Available Help at Discharge: Family;Available  24 hours/day Type of Home: House Home Access: Ramped entrance       Home Layout: One level Home Equipment: Other (comment);Wheelchair - manual Additional Comments: Hemi walker    Prior Function Prior Level of Function : Needs assist             Mobility Comments: difficult to get true feel for home assistance needed, wife  reports he was able to walk in the home with hemiwalker unassisted. has wheelchair as well ADLs Comments: Needs assist- R hemi     Hand Dominance   Dominant Hand: Right    Extremity/Trunk Assessment   Upper Extremity Assessment Upper Extremity Assessment: Defer to OT evaluation    Lower Extremity Assessment Lower Extremity Assessment: RLE deficits/detail RLE Deficits / Details: Hemiplegia on right, increased tone noted, clonus RLE Coordination: decreased gross motor    Cervical / Trunk Assessment Cervical / Trunk Assessment: Normal  Communication   Communication: No difficulties  Cognition Arousal/Alertness: Awake/alert Behavior During Therapy: WFL for tasks assessed/performed Overall Cognitive Status: Within Functional Limits for tasks assessed                                          General Comments      Exercises     Assessment/Plan    PT Assessment Patient needs continued PT services  PT Problem List Decreased strength;Decreased mobility;Decreased safety awareness;Decreased activity tolerance;Decreased balance;Pain;Decreased skin integrity;Obesity;Decreased coordination       PT Treatment Interventions DME instruction;Therapeutic exercise;Gait training;Balance training;Functional mobility training;Therapeutic activities;Patient/family education    PT Goals (Current goals can be found in the Care Plan section)  Acute Rehab PT Goals Patient Stated Goal: to be able to return home. PT Goal Formulation: With patient/family Time For Goal Achievement: 08/24/21 Potential to Achieve Goals: Fair    Frequency Min 3X/week     Co-evaluation               AM-PAC PT "6 Clicks" Mobility  Outcome Measure Help needed turning from your back to your side while in a flat bed without using bedrails?: A Lot Help needed moving from lying on your back to sitting on the side of a flat bed without using bedrails?: A Lot Help needed moving to and from a  bed to a chair (including a wheelchair)?: A Lot Help needed standing up from a chair using your arms (e.g., wheelchair or bedside chair)?: A Lot Help needed to walk in hospital room?: Total Help needed climbing 3-5 steps with a railing? : Total 6 Click Score: 10    End of Session Equipment Utilized During Treatment: Gait belt Activity Tolerance: Patient limited by fatigue;Patient limited by pain Patient left: in bed;with call bell/phone within reach;with bed alarm set;with family/visitor present;with nursing/sitter in room Nurse Communication: Mobility status PT Visit Diagnosis: Unsteadiness on feet (R26.81);Other abnormalities of gait and mobility (R26.89);Muscle weakness (generalized) (M62.81);Difficulty in walking, not elsewhere classified (R26.2);Pain Pain - Right/Left: Left Pain - part of body: Leg    Time: 1435-1451 PT Time Calculation (min) (ACUTE ONLY): 16 min   Charges:   PT Evaluation $PT Eval Moderate Complexity: 1 Mod          Maris Abascal, PT, GCS 08/13/21,3:30 PM

## 2021-08-13 NOTE — Assessment & Plan Note (Addendum)
K low at 2.9. Suspect related to recent insulin use. -Klor-con tablet 40 mEq every 4 hours x 2 doses -BMP '@1700'$  -AM BMP

## 2021-08-13 NOTE — Progress Notes (Signed)
  Echocardiogram 2D Echocardiogram has been performed.  Craig Knight 08/13/2021, 4:02 PM

## 2021-08-14 ENCOUNTER — Other Ambulatory Visit (HOSPITAL_COMMUNITY): Payer: Self-pay

## 2021-08-14 DIAGNOSIS — E785 Hyperlipidemia, unspecified: Secondary | ICD-10-CM | POA: Diagnosis present

## 2021-08-14 DIAGNOSIS — Z7902 Long term (current) use of antithrombotics/antiplatelets: Secondary | ICD-10-CM | POA: Diagnosis not present

## 2021-08-14 DIAGNOSIS — L03115 Cellulitis of right lower limb: Secondary | ICD-10-CM | POA: Diagnosis not present

## 2021-08-14 DIAGNOSIS — Z888 Allergy status to other drugs, medicaments and biological substances status: Secondary | ICD-10-CM | POA: Diagnosis not present

## 2021-08-14 DIAGNOSIS — G8191 Hemiplegia, unspecified affecting right dominant side: Secondary | ICD-10-CM | POA: Diagnosis not present

## 2021-08-14 DIAGNOSIS — Z7982 Long term (current) use of aspirin: Secondary | ICD-10-CM | POA: Diagnosis not present

## 2021-08-14 DIAGNOSIS — J449 Chronic obstructive pulmonary disease, unspecified: Secondary | ICD-10-CM | POA: Diagnosis present

## 2021-08-14 DIAGNOSIS — G4733 Obstructive sleep apnea (adult) (pediatric): Secondary | ICD-10-CM | POA: Diagnosis present

## 2021-08-14 DIAGNOSIS — I639 Cerebral infarction, unspecified: Secondary | ICD-10-CM | POA: Diagnosis not present

## 2021-08-14 DIAGNOSIS — E1122 Type 2 diabetes mellitus with diabetic chronic kidney disease: Secondary | ICD-10-CM | POA: Diagnosis not present

## 2021-08-14 DIAGNOSIS — E876 Hypokalemia: Secondary | ICD-10-CM | POA: Diagnosis not present

## 2021-08-14 DIAGNOSIS — I69351 Hemiplegia and hemiparesis following cerebral infarction affecting right dominant side: Secondary | ICD-10-CM | POA: Diagnosis not present

## 2021-08-14 DIAGNOSIS — I5032 Chronic diastolic (congestive) heart failure: Secondary | ICD-10-CM | POA: Diagnosis present

## 2021-08-14 DIAGNOSIS — E1165 Type 2 diabetes mellitus with hyperglycemia: Secondary | ICD-10-CM | POA: Diagnosis present

## 2021-08-14 DIAGNOSIS — N1832 Chronic kidney disease, stage 3b: Secondary | ICD-10-CM | POA: Diagnosis not present

## 2021-08-14 DIAGNOSIS — R739 Hyperglycemia, unspecified: Secondary | ICD-10-CM | POA: Diagnosis present

## 2021-08-14 DIAGNOSIS — I10 Essential (primary) hypertension: Secondary | ICD-10-CM | POA: Diagnosis not present

## 2021-08-14 DIAGNOSIS — Z79899 Other long term (current) drug therapy: Secondary | ICD-10-CM | POA: Diagnosis not present

## 2021-08-14 DIAGNOSIS — R609 Edema, unspecified: Secondary | ICD-10-CM | POA: Diagnosis not present

## 2021-08-14 DIAGNOSIS — R600 Localized edema: Secondary | ICD-10-CM | POA: Diagnosis not present

## 2021-08-14 DIAGNOSIS — I130 Hypertensive heart and chronic kidney disease with heart failure and stage 1 through stage 4 chronic kidney disease, or unspecified chronic kidney disease: Secondary | ICD-10-CM | POA: Diagnosis present

## 2021-08-14 DIAGNOSIS — N179 Acute kidney failure, unspecified: Secondary | ICD-10-CM | POA: Diagnosis not present

## 2021-08-14 DIAGNOSIS — L281 Prurigo nodularis: Secondary | ICD-10-CM | POA: Diagnosis present

## 2021-08-14 DIAGNOSIS — L409 Psoriasis, unspecified: Secondary | ICD-10-CM | POA: Diagnosis present

## 2021-08-14 LAB — BASIC METABOLIC PANEL
Anion gap: 11 (ref 5–15)
BUN: 40 mg/dL — ABNORMAL HIGH (ref 6–20)
CO2: 26 mmol/L (ref 22–32)
Calcium: 9.7 mg/dL (ref 8.9–10.3)
Chloride: 100 mmol/L (ref 98–111)
Creatinine, Ser: 2.23 mg/dL — ABNORMAL HIGH (ref 0.61–1.24)
GFR, Estimated: 34 mL/min — ABNORMAL LOW (ref >60)
Glucose, Bld: 171 mg/dL — ABNORMAL HIGH (ref 70–99)
Potassium: 3.8 mmol/L (ref 3.5–5.1)
Sodium: 137 mmol/L (ref 135–145)

## 2021-08-14 LAB — GLUCOSE, CAPILLARY
Glucose-Capillary: 156 mg/dL — ABNORMAL HIGH (ref 70–99)
Glucose-Capillary: 130 mg/dL — ABNORMAL HIGH (ref 70–99)
Glucose-Capillary: 183 mg/dL — ABNORMAL HIGH (ref 70–99)
Glucose-Capillary: 158 mg/dL — ABNORMAL HIGH (ref 70–99)

## 2021-08-14 LAB — UREA NITROGEN, URINE: Urea Nitrogen, Ur: 473 mg/dL

## 2021-08-14 MED ORDER — ACETAMINOPHEN 500 MG PO TABS
1000.0000 mg | ORAL_TABLET | Freq: Four times a day (QID) | ORAL | Status: DC
Start: 2021-08-14 — End: 2021-08-14
  Filled 2021-08-14: qty 2

## 2021-08-14 MED ORDER — FUROSEMIDE 40 MG PO TABS
40.0000 mg | ORAL_TABLET | Freq: Every day | ORAL | Status: DC
  Administered 2021-08-14 – 2021-08-15 (×2): 40 mg via ORAL
  Filled 2021-08-14 (×2): qty 1

## 2021-08-14 MED ORDER — ACETAMINOPHEN 500 MG PO TABS
1000.0000 mg | ORAL_TABLET | Freq: Four times a day (QID) | ORAL | Status: DC
  Administered 2021-08-14 – 2021-08-15 (×4): 1000 mg via ORAL
  Filled 2021-08-14 (×4): qty 2

## 2021-08-14 NOTE — TOC Benefit Eligibility Note (Signed)
Patient Teacher, English as a foreign language completed.    The patient is currently admitted and upon discharge could be taking Farxiga 10 mg.  The current 30 day co-pay is, $0.00.   The patient is currently admitted and upon discharge could be taking Jardiance 10 mg.  The current 30 day co-pay is, $0.00.   The patient is insured through [place], [place] Patient Advocate Specialist [place] Patient Advocate Team Direct Number: [phone]  Fax: [phone]

## 2021-08-14 NOTE — Evaluation (Signed)
Occupational Therapy Evaluation Patient Details Name: Craig Knight MRN: [id number] DOB: 27-Apr-1965 Today's Date: 08/14/2021   History of Present Illness 56 Y/O with PMHx of CVA, COPD, DM2, HTN, CVA, and OSA was brought in for B/L LE swelling, pain of his right LE, and generalized itchy nodular rash, all ongoing x 1 month. He endorsed a painful wound on his right calf, which started oozing clear liquid a few days ago.   Clinical Impression   Pt currently at max assist level for selfcare tasks sit to stand, toilet transfers, and toileting tasks.  Feel he will benefit from acute care OT with transition to AIR therapy prior to home.  This therapist is familiar with pt from his CVA and I feel AIR will help get him to a min assist level where his spouse can provide safe 24 hr supervision/min assist level care.      Recommendations for follow up therapy are one component of a multi-disciplinary discharge planning process, led by the attending physician.  Recommendations may be updated based on patient status, additional functional criteria and insurance authorization.   Follow Up Recommendations  Acute inpatient rehab (3hours/day)    Assistance Recommended at Discharge Frequent or constant Supervision/Assistance  Patient can return home with the following A lot of help with walking and/or transfers;A lot of help with bathing/dressing/bathroom;Assistance with cooking/housework;Assist for transportation;Help with stairs or ramp for entrance;Direct supervision/assist for medications management    Functional Status Assessment  Patient has had a recent decline in their functional status and demonstrates the ability to make significant improvements in function in a reasonable and predictable amount of time.  Equipment Recommendations  None recommended by OT    Recommendations for Other Services Rehab consult     Precautions / Restrictions Precautions Precautions: Fall Restrictions Weight  Bearing Restrictions: No      Mobility Bed Mobility Overal bed mobility: Needs Assistance Bed Mobility: Sit to Supine, Supine to Sit     Supine to sit: Max assist Sit to supine: Max assist   General bed mobility comments: Pt needed assistance with lifting trunk up to sitting as well as max assist for scooting to the EOB when getting up.  He needed assist with lowering trunk and lifting LEs when transferring back in the bed.    Transfers Overall transfer level: Needs assistance   Transfers: Sit to/from Stand, Bed to chair/wheelchair/BSC Sit to Stand: Max assist Stand pivot transfers: Max assist   Step pivot transfers: Max assist     General transfer comment: Pt with clonus in the RLE with initial standing and at times during transfer.  Decreased efficiency with advancing the RLE and supporting weightbearing when stepping.      Balance Overall balance assessment: Needs assistance Sitting-balance support: Feet supported Sitting balance-Leahy Scale: Fair     Standing balance support: Single extremity supported, During functional activity Standing balance-Leahy Scale: Poor Standing balance comment: Pt needs assist from therapist to maintain balance in standing.                           ADL either performed or assessed with clinical judgement   ADL Overall ADL's : Needs assistance/impaired Eating/Feeding: Set up;Sitting   Grooming: Wash/dry face;Wash/dry hands;Minimal assistance   Upper Body Bathing: Minimal assistance;Sitting   Lower Body Bathing: +2 for safety/equipment;Maximal assistance;Sit to/from stand   Upper Body Dressing : Moderate assistance;Sitting   Lower Body Dressing: Maximal assistance   Toilet Transfer: Maximal assistance;Stand-pivot;BSC/3in1  Toileting- Clothing Manipulation and Hygiene: Maximal assistance;Sit to/from stand       Functional mobility during ADLs: Maximal assistance General ADL Comments: Pt with increased clonus in  the RLE with sit to stand and standing.  Decreased ability to maintain full upright posture during transfer secondary to this with decreased ability to advance the RLE efficiently and support weightbearing.  Spouse reports having to assist him a lot with selfcare tasks recently as well.  Feel he will benefit from acute care OT with transition to AIR therapy prior to home.     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  Limitations with PROM shoulder flexion 0-90 degrees with end range pain.  Increased flexor tone in the digits as well with Brunnstrum stage II movement in the arm and hand.  He does not use functionally for selfcare tasks. RUE: Shoulder pain with ROM RUE Sensation: WNL (gross assessment) RUE Coordination: decreased fine motor;decreased gross motor   Lower Extremity Assessment Lower Extremity Assessment: Defer to PT evaluation   Cervical / Trunk Assessment Cervical / Trunk Assessment: Normal   Communication Communication Communication: (P) No difficulties   Cognition Arousal/Alertness: Awake/alert Behavior During Therapy: WFL for tasks assessed/performed Overall Cognitive Status: Within Functional Limits for tasks assessed                                                  Home Living Family/patient expects to be discharged to:: (P) Private residence Living Arrangements: (P) Spouse/significant other;Children Available Help at  Discharge: (P) Family;Available 24 hours/day Type of Home: (P) House Home Access: (P) Ramped entrance     Home Layout: (P) One level     Bathroom Shower/Tub: (P) Walk-in shower   Bathroom Toilet: (P) Standard Bathroom Accessibility: (P) Yes   Home Equipment: (P) Other (comment);Wheelchair - Biomedical scientist Comments: (P) Hemi walker      Prior Functioning/Environment Prior Level of Function : (P) Needs assist             Mobility Comments: (P) difficult to get true feel for home assistance needed, wife reports he was able to walk in the home with hemiwalker unassisted. has wheelchair as well ADLs Comments: (P) Spouse reports having to assist with selfcare tasks at min to mod assist.        OT Problem List: Decreased strength;Decreased knowledge of use of DME or AE;Impaired tone;Decreased range of motion;Decreased coordination;Decreased activity tolerance;Impaired UE functional use;Pain;Impaired balance (sitting and/or standing)      OT Treatment/Interventions: Self-care/ADL training;Therapeutic exercise;Patient/family education;Neuromuscular education;Balance training;Therapeutic activities;DME and/or AE instruction    OT Goals(Current goals can be found in the care plan section) Acute Rehab OT Goals Patient Stated Goal: Pt wants to get his leg better OT Goal Formulation: With patient/family Time For Goal Achievement: 08/28/21 Potential to Achieve Goals: Good  OT Frequency: Min 2X/week       AM-PAC OT "  6 Clicks" Daily Activity     Outcome Measure Help from another person eating meals?: A Little Help from another person taking care of personal grooming?: A Little Help from another person toileting, which includes using toliet, bedpan, or urinal?: A Lot Help from another person bathing (including washing, rinsing, drying)?: A Lot Help from another person to put on and taking off regular upper body clothing?: A Lot Help from another person to put on and  taking off regular lower body clothing?: A Lot 6 Click Score: 14   End of Session Nurse Communication: Mobility status  Activity Tolerance: Patient tolerated treatment well Patient left: in bed;with call bell/phone within reach;with bed alarm set  OT Visit Diagnosis: Unsteadiness on feet (R26.81);Muscle weakness (generalized) (M62.81);Hemiplegia and hemiparesis Hemiplegia - Right/Left: Right Hemiplegia - dominant/non-dominant: Dominant Hemiplegia - caused by: Cerebral infarction                Time: 1962-2297 OT Time Calculation (min): 43 min Charges:  OT General Charges $OT Visit: 1 Visit OT Evaluation $OT Eval Moderate Complexity: 1 Mod OT Treatments $Self Care/Home Management : 23-37 mins  Eleazar Kimmey OTR/L 08/14/2021, 9:54 AM

## 2021-08-14 NOTE — Progress Notes (Signed)
Inpatient Rehab Admissions Coordinator:   Per OT recommendation, patient was screened for CIR candidacy by Clemens Catholic, MS, CCC-SLP. At this time, Pt. does not appear to demonstrate medical necessity to justify in hospital rehabilitation/CIR.  Pt.'s insurance is also very unlikely to approve CIR for this diagnosis. I will not pursue a rehab consult for this Pt.   Recommend other rehab venues to be pursued.  Please contact me with any questions.  Clemens Catholic, [place], [place] Admissions Coordinator  [phone] ([place]) [phone] (office)

## 2021-08-14 NOTE — TOC Initial Note (Signed)
Transition of Care [facility]) - Initial/Assessment Note    Patient Details  Name: Craig Knight MRN: [id number] Date of Birth: 1966/02/17  [facility]) CM/SW Contact:    Milinda Antis, [place] Phone Number: 08/14/2021, 10:41 AM  Clinical Narrative:                 CSW received consult for possible SNF placement at time of discharge. CSW spoke with patient and patient's spouse at bedside.  Patient reported that he wants to go home and not to a facility.  He reports that his wife and daughter can assist.  He also reported that he would call his son to help.  CSW engaged with the wife who spoke with patient and said that it would be a lot of work for the family, but the decision is up to the patient.  The patient continued to say that he wanted to go home.  CSW explained that the family would be the main caregivers as the home health agency would not come to the home everyday.  The patient and spouse expressed understanding.  CSW inquired about choice of home health agencies.  The family did not have a preference.  CSW inquired about DME.  The family reports that the patient has a wheel chair, cane, rolling walker, and bedside commode.  Patient's address was verified.  TOC will provide Medicare ratings list. No further questions reported at this time.      Expected Discharge Plan: [place]     Patient Goals and CMS Choice Patient states their goals for this hospitalization and ongoing recovery are:: To go home CMS Medicare.gov Compare Post Acute Care list provided to:: Patient Choice offered to / list presented to : Patient, Spouse  Expected Discharge Plan and Services Expected Discharge Plan: [place]       Living arrangements for the past 2 months: Single Family Home                                      Prior Living Arrangements/Services Living arrangements for the past 2 months: Single Family Home Lives with:: Self,  Spouse Patient language and need for interpreter reviewed:: Yes Do you feel safe going back to the place where you live?: Yes      Need for Family Participation in Patient Care: Yes (Comment) Care giver support system in place?: Yes (comment)   Criminal Activity/Legal Involvement Pertinent to Current Situation/Hospitalization: No - Comment as needed  Activities of Daily Living Home Assistive Devices/Equipment: Wheelchair, Environmental consultant (specify type) ADL Screening (condition at time of admission) Patient's cognitive ability adequate to safely complete daily activities?: Yes Is the patient deaf or have difficulty hearing?: No Does the patient have difficulty seeing, even when wearing glasses/contacts?: No Does the patient have difficulty concentrating, remembering, or making decisions?: No Patient able to express need for assistance with ADLs?: Yes Does the patient have difficulty dressing or bathing?: Yes Independently performs ADLs?: No Communication: Independent Dressing (OT): Needs assistance Is this a change from baseline?: Pre-admission baseline Grooming: Needs assistance Is this a change from baseline?: Pre-admission baseline Feeding: Needs assistance Is this a change from baseline?: Pre-admission baseline Bathing: Needs assistance Is this a change from baseline?: Pre-admission baseline Toileting: Needs assistance Is this a change from baseline?: Pre-admission baseline In/Out Bed: Needs assistance Is this a change from baseline?: Pre-admission baseline Walks in Home: Needs assistance  Is this a change from baseline?: Pre-admission baseline Does the patient have difficulty walking or climbing stairs?: Yes Weakness of Legs: Both Weakness of Arms/Hands: Right  Permission Sought/Granted Permission sought to share information with : Family Supports Permission granted to share information with : Yes, Verbal Permission Granted     Permission granted to share info w AGENCY: home health  and DME agencies        Emotional Assessment Appearance:: Appears stated age Attitude/Demeanor/Rapport: Engaged Affect (typically observed): Appropriate Orientation: : Oriented to Situation, Oriented to  Time, Oriented to Place, Oriented to Self Alcohol / Substance Use: Not Applicable Psych Involvement: No (comment)  Admission diagnosis:  Hyperglycemia [R73.9] Cellulitis of right lower extremity [L03.115] AKI (acute kidney injury) ([place]) [N17.9] Patient Active Problem List   Diagnosis Date Noted   Hypokalemia 08/13/2021   Bilateral lower extremity edema 08/13/2021   AKI (acute kidney injury) ([place]) 08/12/2021   Cellulitis of right lower extremity 08/12/2021   Prurigo nodularis    Dysphagia, post-stroke    Essential hypertension    Diabetic peripheral neuropathy (HCC)    Infarction of left basal ganglia ([place]) 09/19/2020   Acute CVA (cerebrovascular accident) ([place]) 09/06/2020   Stroke ([place]) 11/23/2018   Numbness 11/23/2018   CVA (cerebral vascular accident) ([place]) 11/23/2018   Lung nodule 11/17/2017   Chest pressure 11/22/2016   Shortness of breath 11/22/2016   Depression, major, single episode, moderate ([place]) 10/14/2016   Special screening for malignant neoplasms, colon    Chronic obstructive pulmonary disease ([place]) 06/08/2016   Hypertensive emergency 02/16/2016   Hypertensive urgency 06/07/2015   CKD (chronic kidney disease), stage III ([place]) 06/07/2015   Pain in the chest    Elevated troponin    Chronic diastolic CHF (congestive heart failure) ([place]) 09/17/2013   DM type 2 (diabetes mellitus, type 2) ([place]) 09/15/2013   Chest pain 08/01/2011   HTN (hypertension) 08/01/2011   Chest pain 07/22/2011   Renal insufficiency 07/22/2011   Tobacco abuse 07/22/2011   Hypertension    PCP:  Carrolyn Meiers, MD Pharmacy:   [facility], [place] - [address] [place] [address] Phone: [phone] Fax:  [phone]     Social Determinants of Health (SDOH) Interventions    Readmission Risk Interventions     No data to display

## 2021-08-14 NOTE — Progress Notes (Signed)
Daily Progress Note Intern Pager: [phone]  Patient name: Craig Knight Medical record number: [id number] Date of birth: [birth year] Age: 56 y.o. Gender: male  Primary Care Provider: Carrolyn Meiers, MD Consultants: Wound care Code Status: Full  Pt Overview and Major Events to Date:  6/18: Admitted  Assessment and Plan: Craig Knight is a 56 y.o. male who presented with purulent wound on right calf with edema suspected to be cellulitis and currently being treated for uncontrolled diabetes and AKI. Pertinent PMH/PSH includes CKD 3B, CVAs with residual right hemiparesis, HFpEF, HTN, COPD, OSA on CPAP, T2DM.  * Cellulitis of right lower extremity Continues to be afebrile and hemodynamically stable.  Pain well controlled with increased gabapentin dose. -Wound care following, appreciate assistance -BCx NGx2d, continue to monitor -TED hose -Continue Doxycycline '100mg'$  BID (day 3/7) -Tylenol 1 g every 6 hours -Continue Gabapentin to '300mg'$  TID  Bilateral lower extremity edema Pitting edema of lower extremity improved. Suspect volume overloaded. Home medication regimen includes lasix and chlorthalidone.  Echo showed LVEF 65 to 70% with LVH and G1DD. -Restart home Lasix 40 mg p.o. daily  Hypokalemia K improved to 3.8. Suspect related to recent insulin use. 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  Continues to receive benefit from Benadryl. - Benadryl 25 mg every 6 hours as needed for itching  AKI (acute kidney injury) ([place]) Creatinine continues to improve 2.61>2.23. Will attempt to continue diuresing and establish clarified Cr baseline. -Hold home agents: chlorthalidone, losartan -Strict I/Os, daily weights -Outpatient follow-up with nephrology for proteinuria work-up  DM type 2 (diabetes mellitus, type 2) ([place]) CBGs ranging from 150s-190s.  Received 6 units SAI in the last 24 hours. -CBG monitoring 4 times  daily, before meals and at bedtime -sSSI -Continue Tradjenta 5 mg daily  Hypertension Remained stable ranging 130s-150s/80s-90s. Home medications include chlorthalidone 25 mg daily, losartan 100 mg daily, Imdur 60 mg daily. -Continue Imdur 60 mg daily -Vital signs per unit -consider starting coreg in the future given HFpEF  FEN/GI: Carb modified PPx: Lovenox Dispo:SNF or home tomorrow. Barriers include AKI.   Subjective:  States his pain is gone and what ever we have been doing for him has been amazing.  Objective: Temp:  [98.4 F (36.9 C)-98.7 F (37.1 C)] 98.7 F (37.1 C) (06/20 0841) Pulse Rate:  [84-92] 92 (06/20 0841) Resp:  [16-18] 16 (06/20 0841) BP: (110-152)/(67-93) 110/67 (06/20 0841) SpO2:  [94 %-95 %] 95 % (06/20 0841) Weight:  [107.4 kg] 107.4 kg (06/20 0438) Physical Exam: General: Awake, alert, pleasantly conversational, NAD Cardiovascular: RRR, no murmurs auscultated Respiratory: CTA B, normal WOB Abdomen: Soft, obese abdomen, normoactive BS Extremities: +1 pitting edema of BLEs up to knee, right lower extremity wrapped from knee to ankle  Laboratory: Most recent CBC Lab Results  Component Value Date   WBC 9.0 08/12/2021   HGB 12.8 (L) 08/12/2021   HCT 39.2 08/12/2021   MCV 79.0 (L) 08/12/2021   PLT 223 08/12/2021   Most recent BMP    Latest Ref Rng & Units 08/14/2021    4:13 AM  BMP  Glucose 70 - 99 mg/dL 171   BUN 6 - 20 mg/dL 40   Creatinine 0.61 - 1.24 mg/dL 2.23   Sodium 135 - 145 mmol/L 137   Potassium 3.5 - 5.1 mmol/L 3.8   Chloride 98 - 111 mmol/L 100   CO2 22 - 32 mmol/L 26   Calcium 8.9 -  10.3 mg/dL 9.7    CBG (last 3)  Recent Labs    08/13/21 2134 08/14/21 0726 08/14/21 1145  GLUCAP 176* 156* 130*     Imaging/Diagnostic Tests: Echo: LVEF 65 to 70% with LVH, G1 DD Wells Guiles, DO 08/14/2021, 11:57 AM  PGY-1, [place] Intern pager: [phone], text pages welcome Secure chat group [place]

## 2021-08-14 NOTE — Progress Notes (Signed)
  S: Went to bedside to check on patient, he was resting comfortably so I did not wake him. I spoke to the nurse tech who reports that he has been doing well and denies any concerns.   O: BP (!) 152/87 (BP Location: Left Arm)   Pulse 89   Temp 98.4 F (36.9 C) (Oral)   Resp 17   Ht '5\' 7"'$  (1.702 m)   Wt 108 kg   SpO2 94%   BMI 37.29 kg/m   General: Patient resting comfortably, in no acute distress. Resp: normal work of breathing noted  A/P: Vitals stable and orders reviewed. Initiated lasix yesterday, will monitor I/Os. Thus far output 700 mL although nurse says he has more than this. Plan per day team.   Donney Dice, DO 08/14/2021, 1:37 AM PGY-2, [place] Medicine Service pager [phone]

## 2021-08-15 ENCOUNTER — Other Ambulatory Visit (HOSPITAL_COMMUNITY): Payer: Self-pay

## 2021-08-15 DIAGNOSIS — E1122 Type 2 diabetes mellitus with diabetic chronic kidney disease: Secondary | ICD-10-CM | POA: Diagnosis not present

## 2021-08-15 DIAGNOSIS — R600 Localized edema: Secondary | ICD-10-CM | POA: Diagnosis not present

## 2021-08-15 DIAGNOSIS — N179 Acute kidney failure, unspecified: Secondary | ICD-10-CM | POA: Diagnosis not present

## 2021-08-15 DIAGNOSIS — L03115 Cellulitis of right lower limb: Secondary | ICD-10-CM | POA: Diagnosis not present

## 2021-08-15 LAB — BASIC METABOLIC PANEL
Anion gap: 10 (ref 5–15)
BUN: 41 mg/dL — ABNORMAL HIGH (ref 6–20)
CO2: 27 mmol/L (ref 22–32)
Calcium: 10 mg/dL (ref 8.9–10.3)
Chloride: 100 mmol/L (ref 98–111)
Creatinine, Ser: 2.36 mg/dL — ABNORMAL HIGH (ref 0.61–1.24)
GFR, Estimated: 32 mL/min — ABNORMAL LOW (ref >60)
Glucose, Bld: 177 mg/dL — ABNORMAL HIGH (ref 70–99)
Potassium: 3.6 mmol/L (ref 3.5–5.1)
Sodium: 137 mmol/L (ref 135–145)

## 2021-08-15 LAB — GLUCOSE, CAPILLARY: Glucose-Capillary: 145 mg/dL — ABNORMAL HIGH (ref 70–99)

## 2021-08-15 MED ORDER — FUROSEMIDE 20 MG PO TABS: 40.0000 mg | ORAL_TABLET | Freq: Every day | ORAL | 0 refills | Status: AC

## 2021-08-15 MED ORDER — GABAPENTIN 300 MG PO CAPS: 300.0000 mg | ORAL_CAPSULE | Freq: Every day | ORAL | Status: AC

## 2021-08-15 MED ORDER — EMPAGLIFLOZIN 10 MG PO TABS
10.0000 mg | ORAL_TABLET | Freq: Every day | ORAL | 0 refills | Status: AC
  Filled 2021-08-15: qty 30, 30d supply, fill #0

## 2021-08-15 MED ORDER — LOSARTAN POTASSIUM 25 MG PO TABS
25.0000 mg | ORAL_TABLET | Freq: Every day | ORAL | 0 refills | Status: AC
  Filled 2021-08-15: qty 30, 30d supply, fill #0

## 2021-08-15 MED ORDER — DOXYCYCLINE HYCLATE 100 MG PO TABS
100.0000 mg | ORAL_TABLET | Freq: Two times a day (BID) | ORAL | 0 refills | Status: DC
  Filled 2021-08-15: qty 7, 4d supply, fill #0

## 2021-08-15 NOTE — Progress Notes (Signed)
  S: Went to bedside to check on patient, he was sleeping so I did not wake him.   O: BP 139/89   Pulse 90   Temp 98.7 F (37.1 C) (Oral)   Resp 18   Ht '5\' 7"'$  (1.702 m)   Wt 107.4 kg   SpO2 94%   BMI 37.08 kg/m   General: Patient resting comfortably, in no acute distress. Resp: normal work of breathing noted  A/P: Vitals stable and orders reviewed. Continue plan per day team.   Donney Dice, DO 08/15/2021, 12:38 AM PGY-2, [place] Medicine Service pager [phone]

## 2021-08-15 NOTE — Progress Notes (Signed)
Physical Therapy Treatment Patient Details Name: Craig Knight MRN: [id number] DOB: 06-15-1965 Today's Date: 08/15/2021   History of Present Illness 56 Y/O with PMHx of CVA, COPD, DM2, HTN, CVA, and OSA was brought in for B/L LE swelling, pain of his right LE, and generalized itchy nodular rash, all ongoing x 1 month. He endorsed a painful wound on his right calf, which started oozing clear liquid a few days ago.    PT Comments    Assisted pt with lower body dressing for discharge. Pt obviously irritated by increased R LE clonus, hopeful that botox treatment will assist in clonus reduction so that he can continue progressing his mobility. Pt requires maxAx2 for coming to standing with hemiwalker. After dressing worked on [place] in standing until volunteer came to transport pt downstairs for discharge.     Recommendations for follow up therapy are one component of a multi-disciplinary discharge planning process, led by the attending physician.  Recommendations may be updated based on patient status, additional functional criteria and insurance authorization.  Follow Up Recommendations  Skilled nursing-short term rehab (<3 hours/day)     Assistance Recommended at Discharge Frequent or constant Supervision/Assistance  Patient can return home with the following A lot of help with walking and/or transfers;A lot of help with bathing/dressing/bathroom;Help with stairs or ramp for entrance;Assist for transportation;Assistance with cooking/housework   Equipment Recommendations  None recommended by PT       Precautions / Restrictions Precautions Precautions: Fall Restrictions Weight Bearing Restrictions: No     Mobility  Bed Mobility               General bed mobility comments: up in wheelchair on entry    Transfers Overall transfer level: Needs assistance Equipment used: Hemi-walker Transfers: Sit to/from Stand, Bed to chair/wheelchair/BSC Sit to Stand: Max assist, +2  physical assistance           General transfer comment: utilizing hemiwalker pt able to stand initially for pulling up underware and pants, and then again to work on weightbearing through R LE, pt limited by increased clonus    Ambulation/Gait             Pre-gait activities: weightshifting onto R LE despite clonus General Gait Details: unable       Balance Overall balance assessment: Needs assistance Sitting-balance support: Feet supported Sitting balance-Leahy Scale: Fair     Standing balance support: Single extremity supported, During functional activity, Reliant on assistive device for balance Standing balance-Leahy Scale: Poor Standing balance comment: Pt needs assist from therapist to maintain balance in standing.                            Cognition Arousal/Alertness: Awake/alert Behavior During Therapy: WFL for tasks assessed/performed Overall Cognitive Status: Within Functional Limits for tasks assessed                                             General Comments General comments (skin integrity, edema, etc.): Wife present throughout session      Pertinent Vitals/Pain Pain Assessment Pain Assessment: Faces Faces Pain Scale: Hurts little more Pain Location: L calf Pain Descriptors / Indicators: Discomfort, Sore Pain Intervention(s): Limited activity within patient's tolerance, Monitored during session, Repositioned     PT Goals (current goals can now be found in the care plan section)  Acute Rehab PT Goals Patient Stated Goal: to be able to return home. PT Goal Formulation: With patient/family Time For Goal Achievement: 08/24/21 Potential to Achieve Goals: Fair Progress towards PT goals: Progressing toward goals    Frequency    Min 3X/week      PT Plan Current plan remains appropriate (however pt refusing and returning home)       AM-PAC PT "6 Clicks" Mobility   Outcome Measure  Help needed turning from  your back to your side while in a flat bed without using bedrails?: A Lot Help needed moving from lying on your back to sitting on the side of a flat bed without using bedrails?: A Lot Help needed moving to and from a bed to a chair (including a wheelchair)?: A Lot Help needed standing up from a chair using your arms (e.g., wheelchair or bedside chair)?: A Lot Help needed to walk in hospital room?: Total Help needed climbing 3-5 steps with a railing? : Total 6 Click Score: 10    End of Session Equipment Utilized During Treatment: Gait belt Activity Tolerance: Patient limited by fatigue;Patient limited by pain Patient left: in bed;with call bell/phone within reach;with bed alarm set;with family/visitor present;with nursing/sitter in room Nurse Communication: Mobility status PT Visit Diagnosis: Unsteadiness on feet (R26.81);Other abnormalities of gait and mobility (R26.89);Muscle weakness (generalized) (M62.81);Difficulty in walking, not elsewhere classified (R26.2);Pain Pain - Right/Left: Left Pain - part of body: Leg     Time: 1200-1212 PT Time Calculation (min) (ACUTE ONLY): 12 min  Charges:  $Therapeutic Activity: 8-22 mins                     Denzil Bristol B. Migdalia Dk PT, DPT Acute Rehabilitation Services Please use secure chat or  Call Office [phone]    [place] 08/15/2021, 12:31 PM

## 2021-08-15 NOTE — TOC Transition Note (Signed)
Transition of Care [facility]) - CM/SW Discharge Note   Patient Details  Name: Craig Knight MRN: [id number] Date of Birth: Sep 03, 1965  Transition [facility] Care [facility]) CM/SW Contact:  Tom-Johnson, Renea Ee, RN Phone Number: 08/15/2021, 11:15 AM   Clinical Narrative:     Patient is scheduled for discharge today. Home health info on AVS. PTAR scheduled for transportation. No further TOC needs noted.   Final next level of care: [place] Barriers to Discharge: Barriers Resolved   Patient Goals and CMS Choice Patient states their goals for this hospitalization and ongoing recovery are:: To go home CMS Medicare.gov Compare Post Acute Care list provided to:: Patient Choice offered to / list presented to : Patient, Spouse  Discharge Placement                Patient to be transferred to facility by: PTAR      Discharge Plan and Services                DME Arranged: N/A DME Agency: NA       HH Arranged: PT, OT HH Agency: [place] Date [facility] Agency Contacted: 08/15/21 Time [place]: 0930 Representative spoke with at [place]: [place] Determinants of Health ([place]) Interventions     Readmission Risk Interventions     No data to display

## 2021-08-15 NOTE — Plan of Care (Signed)
  Problem: Education: Goal: Ability to describe self-care measures that may prevent or decrease complications (Diabetes Survival Skills Education) will improve Outcome: Completed/Met Goal: Individualized Educational Video(s) Outcome: Completed/Met   Problem: Coping: Goal: Ability to adjust to condition or change in health will improve Outcome: Completed/Met   Problem: Fluid Volume: Goal: Ability to maintain a balanced intake and output will improve Outcome: Completed/Met   Problem: Health Behavior/Discharge Planning: Goal: Ability to identify and utilize available resources and services will improve Outcome: Completed/Met Goal: Ability to manage health-related needs will improve Outcome: Completed/Met   Problem: Metabolic: Goal: Ability to maintain appropriate glucose levels will improve Outcome: Completed/Met   Problem: Nutritional: Goal: Maintenance of adequate nutrition will improve Outcome: Completed/Met Goal: Progress toward achieving an optimal weight will improve Outcome: Completed/Met   Problem: Skin Integrity: Goal: Risk for impaired skin integrity will decrease Outcome: Completed/Met   Problem: Tissue Perfusion: Goal: Adequacy of tissue perfusion will improve Outcome: Completed/Met   Problem: Elimination: Goal: Will not experience complications related to bowel motility Outcome: Completed/Met Goal: Will not experience complications related to urinary retention Outcome: Completed/Met

## 2021-08-15 NOTE — TOC Progression Note (Addendum)
Transition of Care [facility]) - Progression Note    Patient Details  Name: DEMONT LINFORD MRN: [id number] Date of Birth: [birth year]  [facility] of [facility]) CM/SW Contact  Tom-Johnson, Renea Ee, RN Phone Number: 08/15/2021, 10:50 AM  Clinical Narrative:     CM notified by SW that patient is declining SNF and requesting to go home with home health. Patient states he does not have any preference ton agency. CM sent referral to [facility] and [facility] voiced acceptance. Info on AVS. Patient has all necessary DME's at home. CM will continue to follow with needs.   Expected Discharge Plan: [place]    Expected Discharge Plan and Services Expected Discharge Plan: [place] arrangements for the past 2 months: Single Family Home                                       Social Determinants of Health (SDOH) Interventions    Readmission Risk Interventions     No data to display

## 2021-08-15 NOTE — Progress Notes (Signed)
Patient was offered PTAR transportation and declined.  This RN and charge RN gave education on safety and movement from bed to wheelchair and wheelchair to car transfer.  Patient and wife verbalized understanding and still were adamant that they did not need any help with transfers.

## 2021-08-15 NOTE — Discharge Summary (Addendum)
[facility] Discharge Summary  Patient name: Craig Knight Medical record number: [id number] Date of birth: 24-Mar-1965 Age: 56 y.o. Gender: male Date of Admission: 08/12/2021  Date of Discharge: 08/15/2021 Admitting Physician: Wells Guiles, DO  Primary Care Provider: Carrolyn Meiers, MD Consultants: Wound care  Indication for Hospitalization: Cellulitis of right lower extremity  Discharge Diagnoses/Problem List:  Principal Problem:   Cellulitis of right lower extremity Active Problems:   Hypertension   DM type 2 (diabetes mellitus, type 2) ([place])   AKI (acute kidney injury) ([place])   Prurigo nodularis   Bilateral lower extremity edema   Disposition: Home  Discharge Condition: Stable  Discharge Exam:  Blood pressure (!) 121/96, pulse 90, temperature 98.3 F (36.8 C), temperature source Oral, resp. rate 19, height '5\' 7"'$  (1.702 m), weight 107.6 kg, SpO2 98 %.  General: awake, alert, NAD CV: RRR, no murmurs auscultated Pulm: CTAB, normal WOB Abdomen: soft, obese abdomen, nontender, normoactive BS Extremities: Trace pitting edema of BLEs up to knee, right lower extremity wrapped  Brief Hospital Course:  Craig Knight is a 56 y.o.male with a history of multiple CVAs with residual right hemiparesis, chronic diastolic CHF, HTN, COPD, OSA on CPAP, T2DM, CKD 3B who was admitted to the [facility] at [facility] for purulent cellulitis of right calf and AKI. His hospital course is detailed below:  Cellulitis of RLE Presented with purulent open lesion on right calf. Initially received Ancef in the ED which was then transitioned to Vancomycin and finally Doxycycline for MRSA coverage given purulence. Pain control with Tylenol and Gabapentin ('300mg'$  TID) and received wound care during hospitalization. Discharged with doxycycline to complete total 7-day antibiotic course.  AKI  BLE edema Presented with AKI with elevated creatinine at 2.99.  Suspected to be initially prerenal and due to nephrotoxic agents but showed signs of fluid overload in lower extremities and treated with lasix. Losartan and Chlorthalidone were held briefly and nephrotoxic agents avoided. Echo showed LVEF 65-70% w/ LVH and G1DD. Creatinine improved to establish new baseline of 2.2-2.3. Advised to hold chlorthalidone. Restarted on lower dose of losartan ('25mg'$ ) prior to discharge.   Uncontrolled T2DM Presented hyperglycemic to 455 and A1c of 11.8. CBGs monitored during hospitalization and restarted on home Tradjenta '5mg'$  daily.  He was also started on Jardiance '10mg'$  daily.  Other chronic conditions were medically managed with home medications and formulary alternatives as necessary (OSA, CVA history, HLD, COPD)  PCP Follow-up Recommendations:  Consider further assessment and treatment for psoriasis (right calf, abdomen) Address wound care. Consider increasing losartan as able. Ensure continued workup for proteinuria via nephrology  Significant Procedures: None  Significant Labs and Imaging:  Recent Labs  Lab 08/12/21 0800  WBC 9.0  HGB 12.8*  HCT 39.2  PLT 223   Recent Labs  Lab 08/12/21 0800 08/13/21 0445 08/13/21 1635 08/14/21 0413 08/15/21 0833  NA 133* 137 139 137 137  K 3.7 2.9* 3.7 3.8 3.6  CL 92* 98 102 100 100  CO2 '29 27 26 26 27  '$ GLUCOSE 455* 158* 197* 171* 177*  BUN 57* 47* 42* 40* 41*  CREATININE 2.99* 2.61* 2.37* 2.23* 2.36*  CALCIUM 9.9 9.5 9.7 9.7 10.0  ALKPHOS 118  --   --   --   --   AST 12*  --   --   --   --   ALT 17  --   --   --   --   ALBUMIN 4.1  --   --   --   --  Results/Tests Pending at Time of Discharge: None  Discharge Medications:  Allergies as of 08/15/2021       Reactions   Dust Mite Extract Other (See Comments)   Sneezing   Lisinopril Cough        Medication List     STOP taking these medications    chlorthalidone 25 MG tablet Commonly known as: HYGROTON   ibuprofen 800 MG tablet Commonly  known as: ADVIL       TAKE these medications    acetaminophen 325 MG tablet Commonly known as: TYLENOL Take 2 tablets (650 mg total) by mouth every 4 (four) hours as needed for mild pain (or temp > 37.5 C (99.5 F)).   albuterol 108 (90 Base) MCG/ACT inhaler Commonly known as: VENTOLIN HFA Inhale 2 puffs into the lungs every 6 (six) hours as needed for wheezing or shortness of breath.   aspirin EC 81 MG tablet Take 1 tablet (81 mg total) by mouth daily.   atorvastatin 80 MG tablet Commonly known as: LIPITOR Take 80 mg by mouth daily.   clopidogrel 75 MG tablet Commonly known as: PLAVIX Take 1 tablet (75 mg total) by mouth daily.   doxycycline 100 MG tablet Commonly known as: VIBRA-TABS Take 1 tablet (100 mg total) by mouth every 12 (twelve) hours for 7 doses.   furosemide 20 MG tablet Commonly known as: LASIX Take 2 tablets (40 mg total) by mouth daily. What changed: when to take this   gabapentin 300 MG capsule Commonly known as: NEURONTIN Take 1 capsule (300 mg total) by mouth daily. What changed: when to take this   hydrOXYzine 25 MG tablet Commonly known as: ATARAX Take 25 mg by mouth 3 (three) times daily as needed for anxiety or itching.   isosorbide mononitrate 60 MG 24 hr tablet Commonly known as: IMDUR Take 60 mg by mouth daily.   Jardiance 10 MG Tabs tablet Generic drug: empagliflozin Take 1 tablet (10 mg total) by mouth daily before breakfast.   losartan 25 MG tablet Commonly known as: COZAAR Take 1 tablet (25 mg total) by mouth daily. What changed:  medication strength how much to take   nitroGLYCERIN 0.4 MG SL tablet Commonly known as: NITROSTAT Place 1 tablet (0.4 mg total) under the tongue every 5 (five) minutes as needed for chest pain.   sertraline 50 MG tablet Commonly known as: ZOLOFT Take 50 mg by mouth at bedtime.   tiZANidine 2 MG tablet Commonly known as: ZANAFLEX Take 1 tablet (2 mg total) by mouth 3 (three) times daily.    Tradjenta 5 MG Tabs tablet Generic drug: linagliptin Take 5 mg by mouth daily.   traZODone 100 MG tablet Commonly known as: DESYREL Take 100 mg by mouth at bedtime.   Trelegy Ellipta 100-62.5-25 MCG/ACT Aepb Generic drug: Fluticasone-Umeclidin-Vilant Inhale 1 puff into the lungs daily.   triamcinolone 0.025 % cream Commonly known as: KENALOG Apply 1 Application topically 3 (three) times daily.        Discharge Instructions: Please refer to Patient Instructions section of EMR for full details.  Patient was counseled important signs and symptoms that should prompt return to medical care, changes in medications, dietary instructions, activity restrictions, and follow up appointments.   Follow-Up Appointments: Future Appointments  Date Time Provider [place]  09/06/2021  1:45 PM Kirsteins, Luanna Salk, MD CPR-PRMA CPR    Wells Guiles, DO 08/15/2021, 12:01 PM PGY-1, [place]

## 2021-08-15 NOTE — Progress Notes (Signed)
Inpatient Diabetes Program Recommendations  AACE/ADA: New Consensus Statement on Inpatient Glycemic Control (2015)  Target Ranges:  Prepandial:   less than 140 mg/dL      Peak postprandial:   less than 180 mg/dL (1-2 hours)      Critically ill patients:  140 - 180 mg/dL   Lab Results  Component Value Date   GLUCAP 145 (H) 08/15/2021   HGBA1C 11.8 (H) 08/12/2021    Review of Glycemic Control  Diabetes history: DM2 Outpatient Diabetes medications: tradjenta 5 m QD Current orders for Inpatient glycemic control: Novolog 0-9 units TID  CBG 145, 177 HgbA1C - 11.8%  Inpatient Diabetes Program Recommendations:    Educated patient and spouse (via facetime) on insulin pen use at home.Reviewed all steps if insulin pen including attachment of needle, 2-unit air shot, dialing up dose, giving injection, removing needle, disposal of sharps, storage of unused insulin, disposal of insulin etc.  Spoke with pt about how diet, exercise and stress all play a part in diabetes management Pt states he has a lot of changes to be made with diet. Seems motivated to make changes to control blood sugars. Discussed HgbA1C of 11.8% and importance of reducing to goal of 7%.   Continue to follow.  Thank you. Lorenda Peck, RD, LDN, CDE Inpatient Diabetes Coordinator [phone]

## 2021-08-15 NOTE — Discharge Instructions (Addendum)
Dear Craig Knight,   Thank you for letting us participate in your care! In this section, you will find a brief hospital admission summary of why you were admitted to the hospital, what happened during your admission, your diagnosis/diagnoses, and recommended follow up.  Primary diagnosis: Cellulitis Treatment plan: Antibiotics: Continue doxycycline through 6/24.  He was sent home with this medication. Secondary diagnosis: Lower extremity edema Lower extremity edema treatment plan: You be diuresed during hospitalization.  Please continue Lasix 40 mg daily.  Your losartan strength was lowered to '25mg'$  daily due to having lower blood pressures. Additionally, you were started on Jardiance '10mg'$  daily which will help protect your kidneys and assist with blood sugar control.    POST-HOSPITAL & CARE INSTRUCTIONS We recommend following up with your PCP within 1 week from being discharged from the hospital. Please let PCP/Specialists know of any changes in medications that were made which you will be able to see in the medications section of this packet. Please also follow up with nephrology given they were working up your kidneys further.  DOCTOR'S APPOINTMENTS & FOLLOW UP Future Appointments  Date Time Provider [place]  09/06/2021  1:45 PM Kirsteins, Luanna Salk, MD CPR-PRMA CPR     Thank you for choosing [facility]! Take care and be well!  [facility]  [place], [place] [address] [phone]

## 2021-08-15 NOTE — Progress Notes (Signed)
DISCHARGE NOTE HOME Craig Knight to be discharged Home per MD order. Discussed prescriptions and follow up appointments with the patient. Prescriptions given to patient; medication list explained in detail. Patient verbalized understanding.  Skin clean, dry and intact without evidence of skin break down, no evidence of skin tears noted. IV catheter discontinued intact. Site without signs and symptoms of complications. Dressing and pressure applied. Pt denies pain at the site currently. No complaints noted.  Patient free of lines, drains, and wounds.   An After Visit Summary (AVS) was printed and given to the patient. Patient escorted via wheelchair, and discharged home via private auto.  Vira Agar, RN

## 2021-08-16 ENCOUNTER — Other Ambulatory Visit: Payer: Self-pay

## 2021-08-16 ENCOUNTER — Encounter: Payer: Self-pay | Admitting: *Deleted

## 2021-08-16 DIAGNOSIS — Z720 Tobacco use: Secondary | ICD-10-CM

## 2021-08-16 DIAGNOSIS — E1122 Type 2 diabetes mellitus with diabetic chronic kidney disease: Secondary | ICD-10-CM

## 2021-08-16 DIAGNOSIS — I10 Essential (primary) hypertension: Secondary | ICD-10-CM

## 2021-08-16 NOTE — Patient Outreach (Signed)
[facility]) Care Management  08/16/2021  Craig Knight 04-13-1965 [id number]  [place] [facility] [ACO] Patient: [name]  Primary Care Provider:  Carrolyn Meiers, MD  Telephonic follow up   Patient evaluated for community based chronic complex disease management services with [place] [facility] Program as a benefit of patient's [employer]. Spoke with patient and wife Craig Knight with 2 or more HIPAA identifiers verified with patient's name, date of birth and house address verified to explain [facility] [facility] services.   Explained that the patient qualifies for complex disease management as a benefit.  Explained that review of patient diabetes needs was assess and patient and wife agrees that A1C was high.  Chart review reveals A1C was 11.8 %.    Plan: Patient will receive post hospital discharge call and will be evaluated for complex disease management.      Of note, [facility] Care Management services does not replace or interfere with any services that are arranged by inpatient case management or social work.  For additional questions or referrals please contact:    Craig Brood, RN BSN [facility] Liaison  [phone] business mobile phone Toll free office [phone]  Fax number: [phone] [email or web address]  [email or web address]

## 2021-08-17 ENCOUNTER — Encounter: Payer: Self-pay | Admitting: Physical Medicine & Rehabilitation

## 2021-08-17 ENCOUNTER — Other Ambulatory Visit (HOSPITAL_COMMUNITY): Payer: Self-pay

## 2021-08-17 ENCOUNTER — Encounter: Payer: Medicare Other | Admitting: Physical Medicine & Rehabilitation

## 2021-08-17 ENCOUNTER — Ambulatory Visit (HOSPITAL_COMMUNITY)
Admission: EM | Admit: 2021-08-17 | Discharge: 2021-08-17 | Disposition: A | Discharge status: Home / Self Care | Payer: Medicare Other | Attending: Physician Assistant | Admitting: Physician Assistant

## 2021-08-17 ENCOUNTER — Encounter (HOSPITAL_COMMUNITY): Payer: Self-pay | Admitting: Emergency Medicine

## 2021-08-17 DIAGNOSIS — E11628 Type 2 diabetes mellitus with other skin complications: Secondary | ICD-10-CM | POA: Diagnosis not present

## 2021-08-17 DIAGNOSIS — M79604 Pain in right leg: Secondary | ICD-10-CM

## 2021-08-17 DIAGNOSIS — L03115 Cellulitis of right lower limb: Secondary | ICD-10-CM | POA: Diagnosis not present

## 2021-08-17 DIAGNOSIS — E1169 Type 2 diabetes mellitus with other specified complication: Secondary | ICD-10-CM

## 2021-08-17 HISTORY — DX: Cellulitis, unspecified: L03.90

## 2021-08-17 LAB — CULTURE, BLOOD (ROUTINE X 2)
Culture: NO GROWTH
Culture: NO GROWTH

## 2021-08-17 LAB — CBG MONITORING, ED: Glucose-Capillary: 207 mg/dL — ABNORMAL HIGH (ref 70–99)

## 2021-08-17 MED ORDER — DOXYCYCLINE HYCLATE 100 MG PO TABS: 100.0000 mg | ORAL_TABLET | Freq: Two times a day (BID) | ORAL | 0 refills | Status: AC

## 2021-08-17 NOTE — ED Triage Notes (Signed)
Patient c/o RT lower leg x 2 weeks.   Patient endorses worsening pain with difficulty walking.   Patient was recently discharged on 6/18 for cellulitis in lower leg.   Patient is currently taking doxycyline for cellulitis.

## 2021-08-17 NOTE — Patient Outreach (Signed)
[facility]) Care Management  08/17/2021  Craig Knight [birth year] [id number]   Received hospital referral from Charlesetta Shanks for complex care and disease management follow up calls and assess for further needs. Assigned patient to [name], RN care coordinator for follow up.  [name] [facility] Care Management Assistant [phone]

## 2021-08-18 DIAGNOSIS — I69351 Hemiplegia and hemiparesis following cerebral infarction affecting right dominant side: Secondary | ICD-10-CM | POA: Diagnosis not present

## 2021-08-18 DIAGNOSIS — Z7902 Long term (current) use of antithrombotics/antiplatelets: Secondary | ICD-10-CM | POA: Diagnosis not present

## 2021-08-18 DIAGNOSIS — I5032 Chronic diastolic (congestive) heart failure: Secondary | ICD-10-CM | POA: Diagnosis not present

## 2021-08-18 DIAGNOSIS — L03115 Cellulitis of right lower limb: Secondary | ICD-10-CM | POA: Diagnosis not present

## 2021-08-18 DIAGNOSIS — Z7982 Long term (current) use of aspirin: Secondary | ICD-10-CM | POA: Diagnosis not present

## 2021-08-18 DIAGNOSIS — N179 Acute kidney failure, unspecified: Secondary | ICD-10-CM | POA: Diagnosis not present

## 2021-08-18 DIAGNOSIS — J441 Chronic obstructive pulmonary disease with (acute) exacerbation: Secondary | ICD-10-CM | POA: Diagnosis not present

## 2021-08-18 DIAGNOSIS — Z7951 Long term (current) use of inhaled steroids: Secondary | ICD-10-CM | POA: Diagnosis not present

## 2021-08-18 DIAGNOSIS — I130 Hypertensive heart and chronic kidney disease with heart failure and stage 1 through stage 4 chronic kidney disease, or unspecified chronic kidney disease: Secondary | ICD-10-CM | POA: Diagnosis not present

## 2021-08-18 DIAGNOSIS — G4733 Obstructive sleep apnea (adult) (pediatric): Secondary | ICD-10-CM | POA: Diagnosis not present

## 2021-08-18 DIAGNOSIS — Z9181 History of falling: Secondary | ICD-10-CM | POA: Diagnosis not present

## 2021-08-18 DIAGNOSIS — Z9981 Dependence on supplemental oxygen: Secondary | ICD-10-CM | POA: Diagnosis not present

## 2021-08-18 DIAGNOSIS — E1122 Type 2 diabetes mellitus with diabetic chronic kidney disease: Secondary | ICD-10-CM | POA: Diagnosis not present

## 2021-08-18 DIAGNOSIS — N1832 Chronic kidney disease, stage 3b: Secondary | ICD-10-CM | POA: Diagnosis not present

## 2021-08-18 DIAGNOSIS — Z7984 Long term (current) use of oral hypoglycemic drugs: Secondary | ICD-10-CM | POA: Diagnosis not present

## 2021-08-20 ENCOUNTER — Other Ambulatory Visit: Payer: Self-pay | Admitting: *Deleted

## 2021-08-20 DIAGNOSIS — J441 Chronic obstructive pulmonary disease with (acute) exacerbation: Secondary | ICD-10-CM | POA: Diagnosis not present

## 2021-08-20 DIAGNOSIS — Z7982 Long term (current) use of aspirin: Secondary | ICD-10-CM | POA: Diagnosis not present

## 2021-08-20 DIAGNOSIS — E1122 Type 2 diabetes mellitus with diabetic chronic kidney disease: Secondary | ICD-10-CM | POA: Diagnosis not present

## 2021-08-20 DIAGNOSIS — Z7902 Long term (current) use of antithrombotics/antiplatelets: Secondary | ICD-10-CM | POA: Diagnosis not present

## 2021-08-20 DIAGNOSIS — I69351 Hemiplegia and hemiparesis following cerebral infarction affecting right dominant side: Secondary | ICD-10-CM | POA: Diagnosis not present

## 2021-08-20 DIAGNOSIS — Z9181 History of falling: Secondary | ICD-10-CM | POA: Diagnosis not present

## 2021-08-20 DIAGNOSIS — Z7984 Long term (current) use of oral hypoglycemic drugs: Secondary | ICD-10-CM | POA: Diagnosis not present

## 2021-08-20 DIAGNOSIS — I130 Hypertensive heart and chronic kidney disease with heart failure and stage 1 through stage 4 chronic kidney disease, or unspecified chronic kidney disease: Secondary | ICD-10-CM | POA: Diagnosis not present

## 2021-08-20 DIAGNOSIS — G4733 Obstructive sleep apnea (adult) (pediatric): Secondary | ICD-10-CM | POA: Diagnosis not present

## 2021-08-20 DIAGNOSIS — N1832 Chronic kidney disease, stage 3b: Secondary | ICD-10-CM | POA: Diagnosis not present

## 2021-08-20 DIAGNOSIS — L03115 Cellulitis of right lower limb: Secondary | ICD-10-CM | POA: Diagnosis not present

## 2021-08-20 DIAGNOSIS — Z7951 Long term (current) use of inhaled steroids: Secondary | ICD-10-CM | POA: Diagnosis not present

## 2021-08-20 DIAGNOSIS — Z9981 Dependence on supplemental oxygen: Secondary | ICD-10-CM | POA: Diagnosis not present

## 2021-08-20 DIAGNOSIS — I5032 Chronic diastolic (congestive) heart failure: Secondary | ICD-10-CM | POA: Diagnosis not present

## 2021-08-20 DIAGNOSIS — N179 Acute kidney failure, unspecified: Secondary | ICD-10-CM | POA: Diagnosis not present

## 2021-08-20 NOTE — Patient Outreach (Signed)
[facility]) Care Management  08/20/2021  Craig Knight 08-27-1965 [id number]  Initial Telephone Outreach  RN spoke briefly with pt today and introduced the [facility]. Pt receptive to the call however requested a call back when his wife would be present.   Will follow up once again for [facility] services.  [name], RN Care Management Coordinator [facility] Main Office [phone]

## 2021-08-21 ENCOUNTER — Other Ambulatory Visit: Payer: Self-pay | Admitting: *Deleted

## 2021-08-21 ENCOUNTER — Encounter: Payer: Self-pay | Admitting: *Deleted

## 2021-08-21 DIAGNOSIS — I130 Hypertensive heart and chronic kidney disease with heart failure and stage 1 through stage 4 chronic kidney disease, or unspecified chronic kidney disease: Secondary | ICD-10-CM | POA: Diagnosis not present

## 2021-08-21 DIAGNOSIS — N179 Acute kidney failure, unspecified: Secondary | ICD-10-CM | POA: Diagnosis not present

## 2021-08-21 DIAGNOSIS — Z7902 Long term (current) use of antithrombotics/antiplatelets: Secondary | ICD-10-CM | POA: Diagnosis not present

## 2021-08-21 DIAGNOSIS — L03115 Cellulitis of right lower limb: Secondary | ICD-10-CM | POA: Diagnosis not present

## 2021-08-21 DIAGNOSIS — Z9181 History of falling: Secondary | ICD-10-CM | POA: Diagnosis not present

## 2021-08-21 DIAGNOSIS — Z7982 Long term (current) use of aspirin: Secondary | ICD-10-CM | POA: Diagnosis not present

## 2021-08-21 DIAGNOSIS — E1122 Type 2 diabetes mellitus with diabetic chronic kidney disease: Secondary | ICD-10-CM | POA: Diagnosis not present

## 2021-08-21 DIAGNOSIS — Z7984 Long term (current) use of oral hypoglycemic drugs: Secondary | ICD-10-CM | POA: Diagnosis not present

## 2021-08-21 DIAGNOSIS — I69351 Hemiplegia and hemiparesis following cerebral infarction affecting right dominant side: Secondary | ICD-10-CM | POA: Diagnosis not present

## 2021-08-21 DIAGNOSIS — G4733 Obstructive sleep apnea (adult) (pediatric): Secondary | ICD-10-CM | POA: Diagnosis not present

## 2021-08-21 DIAGNOSIS — I5032 Chronic diastolic (congestive) heart failure: Secondary | ICD-10-CM | POA: Diagnosis not present

## 2021-08-21 DIAGNOSIS — J441 Chronic obstructive pulmonary disease with (acute) exacerbation: Secondary | ICD-10-CM | POA: Diagnosis not present

## 2021-08-21 DIAGNOSIS — Z7951 Long term (current) use of inhaled steroids: Secondary | ICD-10-CM | POA: Diagnosis not present

## 2021-08-21 DIAGNOSIS — E119 Type 2 diabetes mellitus without complications: Secondary | ICD-10-CM

## 2021-08-21 DIAGNOSIS — N1832 Chronic kidney disease, stage 3b: Secondary | ICD-10-CM | POA: Diagnosis not present

## 2021-08-21 DIAGNOSIS — Z9981 Dependence on supplemental oxygen: Secondary | ICD-10-CM | POA: Diagnosis not present

## 2021-08-22 ENCOUNTER — Telehealth: Payer: Self-pay

## 2021-08-22 DIAGNOSIS — I5032 Chronic diastolic (congestive) heart failure: Secondary | ICD-10-CM | POA: Diagnosis not present

## 2021-08-22 DIAGNOSIS — Z9181 History of falling: Secondary | ICD-10-CM | POA: Diagnosis not present

## 2021-08-22 DIAGNOSIS — E1122 Type 2 diabetes mellitus with diabetic chronic kidney disease: Secondary | ICD-10-CM | POA: Diagnosis not present

## 2021-08-22 DIAGNOSIS — Z7984 Long term (current) use of oral hypoglycemic drugs: Secondary | ICD-10-CM | POA: Diagnosis not present

## 2021-08-22 DIAGNOSIS — Z7982 Long term (current) use of aspirin: Secondary | ICD-10-CM | POA: Diagnosis not present

## 2021-08-22 DIAGNOSIS — Z7951 Long term (current) use of inhaled steroids: Secondary | ICD-10-CM | POA: Diagnosis not present

## 2021-08-22 DIAGNOSIS — Z9981 Dependence on supplemental oxygen: Secondary | ICD-10-CM | POA: Diagnosis not present

## 2021-08-22 DIAGNOSIS — I69351 Hemiplegia and hemiparesis following cerebral infarction affecting right dominant side: Secondary | ICD-10-CM | POA: Diagnosis not present

## 2021-08-22 DIAGNOSIS — G4733 Obstructive sleep apnea (adult) (pediatric): Secondary | ICD-10-CM | POA: Diagnosis not present

## 2021-08-22 DIAGNOSIS — N1832 Chronic kidney disease, stage 3b: Secondary | ICD-10-CM | POA: Diagnosis not present

## 2021-08-22 DIAGNOSIS — L03115 Cellulitis of right lower limb: Secondary | ICD-10-CM | POA: Diagnosis not present

## 2021-08-22 DIAGNOSIS — N179 Acute kidney failure, unspecified: Secondary | ICD-10-CM | POA: Diagnosis not present

## 2021-08-22 DIAGNOSIS — I130 Hypertensive heart and chronic kidney disease with heart failure and stage 1 through stage 4 chronic kidney disease, or unspecified chronic kidney disease: Secondary | ICD-10-CM | POA: Diagnosis not present

## 2021-08-22 DIAGNOSIS — J441 Chronic obstructive pulmonary disease with (acute) exacerbation: Secondary | ICD-10-CM | POA: Diagnosis not present

## 2021-08-22 DIAGNOSIS — Z7902 Long term (current) use of antithrombotics/antiplatelets: Secondary | ICD-10-CM | POA: Diagnosis not present

## 2021-08-22 NOTE — Telephone Encounter (Signed)
   Telephone encounter was:  Successful.  08/22/2021 Name: MANOAH DECKARD MRN: [id number] DOB: December 07, 1965  [name] is a 56 y.o. year old male who is a primary care patient of Fanta, Normajean Baxter, MD . The community resource team was consulted for assistance with [place] guide performed the following interventions: Spoke with patient and his wife [name].  Patient is receiving food stamps, verified wife's email address [email or web address] sent list of food pantries. Letter saved in Epic.  Follow Up Plan:  Care guide will follow up with patient by phone over the next 7 days.  Etienne Millward, AAS Paralegal, [facility]  [address], [place] [address] ??[email or web address]  ?? [phone]   [email or web address]

## 2021-08-23 DIAGNOSIS — Z7951 Long term (current) use of inhaled steroids: Secondary | ICD-10-CM | POA: Diagnosis not present

## 2021-08-23 DIAGNOSIS — N179 Acute kidney failure, unspecified: Secondary | ICD-10-CM | POA: Diagnosis not present

## 2021-08-23 DIAGNOSIS — Z7982 Long term (current) use of aspirin: Secondary | ICD-10-CM | POA: Diagnosis not present

## 2021-08-23 DIAGNOSIS — G4733 Obstructive sleep apnea (adult) (pediatric): Secondary | ICD-10-CM | POA: Diagnosis not present

## 2021-08-23 DIAGNOSIS — Z7902 Long term (current) use of antithrombotics/antiplatelets: Secondary | ICD-10-CM | POA: Diagnosis not present

## 2021-08-23 DIAGNOSIS — J441 Chronic obstructive pulmonary disease with (acute) exacerbation: Secondary | ICD-10-CM | POA: Diagnosis not present

## 2021-08-23 DIAGNOSIS — Z9981 Dependence on supplemental oxygen: Secondary | ICD-10-CM | POA: Diagnosis not present

## 2021-08-23 DIAGNOSIS — E1122 Type 2 diabetes mellitus with diabetic chronic kidney disease: Secondary | ICD-10-CM | POA: Diagnosis not present

## 2021-08-23 DIAGNOSIS — I69351 Hemiplegia and hemiparesis following cerebral infarction affecting right dominant side: Secondary | ICD-10-CM | POA: Diagnosis not present

## 2021-08-23 DIAGNOSIS — N1832 Chronic kidney disease, stage 3b: Secondary | ICD-10-CM | POA: Diagnosis not present

## 2021-08-23 DIAGNOSIS — I130 Hypertensive heart and chronic kidney disease with heart failure and stage 1 through stage 4 chronic kidney disease, or unspecified chronic kidney disease: Secondary | ICD-10-CM | POA: Diagnosis not present

## 2021-08-23 DIAGNOSIS — L03115 Cellulitis of right lower limb: Secondary | ICD-10-CM | POA: Diagnosis not present

## 2021-08-23 DIAGNOSIS — Z7984 Long term (current) use of oral hypoglycemic drugs: Secondary | ICD-10-CM | POA: Diagnosis not present

## 2021-08-23 DIAGNOSIS — Z9181 History of falling: Secondary | ICD-10-CM | POA: Diagnosis not present

## 2021-08-23 DIAGNOSIS — I5032 Chronic diastolic (congestive) heart failure: Secondary | ICD-10-CM | POA: Diagnosis not present

## 2021-08-24 NOTE — Patient Instructions (Signed)
Visit Information  Thank you for taking time to visit with me today. Please don't hesitate to contact me if I can be of assistance to you before our next scheduled telephone appointment.  Following are the goals we discussed today:  Take all medications as prescribed Attend all scheduled provider appointments Attend church or other social activities Perform all self care activities independently  Call provider office for new concerns or questions  keep appointment with eye doctor check feet daily for cuts, sores or redness trim toenails straight across drink 6 to 8 glasses of water each day eat fish at least once per week limit fast food meals to no more than 1 per week manage portion size switch to low-fat or skim milk

## 2021-08-24 NOTE — Patient Outreach (Signed)
[facility]) Care Management Telephonic RN Care Manager Note   08/24/2021 Name:  Craig Knight MRN:  [id number] DOB:  1965/08/08  Summary: Enrolled into the [facility] program for diabetes and educated accordingly. Will send tools for ongoing monitoring and stress the importance of monitoring his diabetes. Pt does not  have a meter. Wife will follow up with the provider to request script for a glucometer and 3-1 commode in the home. Discussed the plan of care and possible symptoms involved with hypo-hyperglycemia and what to do if acute symptoms should occur.   Recommendations/Changes made from today's visit: Will also send a referral for secondary transportation as requested by the spouse. Will follow up next month with ongoing [facility] services.   Subjective: Craig Knight is an 56 y.o. year old male who is a primary patient of Fanta, Normajean Baxter, MD. The care management team was consulted for assistance with care management and/or care coordination needs.    Telephonic RN Care Manager completed Telephone Visit today.  Objective:   Medications Reviewed Today     Reviewed by Flossie Dibble, RN (Registered Nurse) on 08/17/21 at 1148  Med List Status: <None>   Medication Order Taking? Sig Documenting Provider Last Dose Status Informant  acetaminophen (TYLENOL) 325 MG tablet [id number] No Take 2 tablets (650 mg total) by mouth every 4 (four) hours as needed for mild pain (or temp > 37.5 C (99.5 F)). Angiulli, Lavon Paganini, PA-C Unknown Active Spouse/Significant Other  albuterol (VENTOLIN HFA) 108 (90 Base) MCG/ACT inhaler [id number] No Inhale 2 puffs into the lungs every 6 (six) hours as needed for wheezing or shortness of breath. Cathlyn Parsons, PA-C Unknown Active Spouse/Significant Other  aspirin EC 81 MG tablet [id number] Yes Take 1 tablet (81 mg total) by mouth daily. Carmin Muskrat, MD 08/17/2021 Active Spouse/Significant Other  atorvastatin (LIPITOR) 80 MG tablet  [id number] Yes Take 80 mg by mouth daily. [provider] 08/17/2021 Active Spouse/Significant Other  clopidogrel (PLAVIX) 75 MG tablet [id number] Yes Take 1 tablet (75 mg total) by mouth daily. Cathlyn Parsons, PA-C 08/17/2021 Active Spouse/Significant Other  doxycycline (VIBRA-TABS) 100 MG tablet [id number] Yes Take 1 tablet (100 mg total) by mouth every 12 (twelve) hours for 7 doses. Zola Button, MD 08/17/2021 Active   empagliflozin (JARDIANCE) 10 MG TABS tablet [id number] Yes Take 1 tablet (10 mg total) by mouth daily before breakfast. Zola Button, MD 08/17/2021 Active   Fluticasone-Umeclidin-Vilant (TRELEGY ELLIPTA) 100-62.5-25 MCG/INH AEPB [id number] Yes Inhale 1 puff into the lungs daily. Cathlyn Parsons, PA-C 08/17/2021 Active Spouse/Significant Other  furosemide (LASIX) 20 MG tablet [id number] Yes Take 2 tablets (40 mg total) by mouth daily. Zola Button, MD 08/16/2021 Active   gabapentin (NEURONTIN) 300 MG capsule [id number] Yes Take 1 capsule (300 mg total) by mouth daily. Zola Button, MD 08/17/2021 Active   hydrOXYzine (ATARAX) 25 MG tablet [id number] Yes Take 25 mg by mouth 3 (three) times daily as needed for anxiety or itching. [provider] 08/17/2021 Active Spouse/Significant Other  isosorbide mononitrate (IMDUR) 60 MG 24 hr tablet [id number] Yes Take 60 mg by mouth daily. [provider] 08/17/2021 Active Spouse/Significant Other  losartan (COZAAR) 25 MG tablet [id number] Yes Take 1 tablet (25 mg total) by mouth daily. Zola Button, MD 08/17/2021 Active   nitroGLYCERIN (NITROSTAT) 0.4 MG SL tablet [id number] No Place 1 tablet (0.4 mg total) under the tongue every 5 (five) minutes as needed for chest pain. Cathlyn Parsons, PA-C Unknown Active Spouse/Significant Other  sertraline (ZOLOFT) 50 MG tablet [id number] Yes Take 50 mg by mouth at bedtime. [provider] 08/16/2021 Active Spouse/Significant Other  tiZANidine (ZANAFLEX) 2 MG tablet [id number] Yes Take 1  tablet (2 mg total) by mouth 3 (three) times daily. Cathlyn Parsons, PA-C 08/17/2021 Active Spouse/Significant Other  TRADJENTA 5 MG TABS tablet [id number] Yes Take 5 mg by mouth daily. [provider] 08/17/2021 Active Spouse/Significant Other  traZODone (DESYREL) 100 MG tablet [id number] Yes Take 100 mg by mouth at bedtime. [provider] 08/16/2021 Active Spouse/Significant Other  triamcinolone (KENALOG) 0.025 % cream [id number] Yes Apply 1 Application topically 3 (three) times daily. [provider] 08/16/2021 Active Spouse/Significant Other             SDOH:  (Social Determinants of Health) assessments and interventions performed:     Care Plan  Review of patient past medical history, allergies, medications, health status, including review of consultants reports, laboratory and other test data, was performed as part of comprehensive evaluation for care management services.   Care Plan : RN Care Manager plan of care  Updates made by Tobi Bastos, RN since 08/24/2021 12:00 AM     Problem: Knowledge deficit related to Diabetes and care coordination needs.   Priority: High     Long-Range Goal: Development plan of care for mangement of Diabetes   Start Date: 08/21/2021  Expected End Date: 01/24/2022  Priority: High  Note:   Current Barriers:  Knowledge Deficits related to plan of care for management of DMII   RNCM Clinical Goal(s):  Patient will verbalize basic understanding of  DMII disease process and self health management plan as evidenced by chart review and self reporting take all medications exactly as prescribed and will call provider for medication related questions as evidenced by chart review and self reporting  through collaboration with RN Care manager, provider, and care team.   Interventions: Inter-disciplinary care team collaboration (see longitudinal plan of care) Evaluation of current treatment plan related to  self management and  patient's adherence to plan as established by provider   Diabetes Interventions:  (Status:  New goal.) Long Term Goal Assessed patient's understanding of A1c goal: <6.5% Provided education to patient about basic DM disease process Reviewed medications with patient and discussed importance of medication adherence Discussed plans with patient for ongoing care management follow up and provided patient with direct contact information for care management team Provided patient with written educational materials related to hypo and hyperglycemia and importance of correct treatment Referral made to community resources care guide team for assistance with food resources Screening for signs and symptoms of depression related to chronic disease state  Assessed social determinant of health barriers Lab Results  Component Value Date   HGBA1C 11.8 (H) 08/12/2021  08/21/2021: Pt enrolled into the [facility] program for Diabetes. Pt does not have a glucometer device for CBG monitoring and not aware of his readings. Pt takes insulin as prescribed but again no device for CBG checks. A1C 11.8  and pt is aware but needed further education. RN educated pt on diabetes and the importance of daily monitoring as a prevention measures to avoid acute episodes for hyperglycemia. Completed the initial assessment for enrollment and attempted contact with Dr. Legrand Rams unsuccessful. Encouraged pt's spouse [name]) to reach out to the provide for the requested DME a glucometer and 3-1 commode for pt to use in the bathroom. No other request or issues to address at this time. Stress the importance of diabetes management  and daily CBG in order to manage his diabetes and reduce his A1C. Pt receptive and will adhere to all discussed for his ongoing management of care.  Patient Goals/Self-Care Activities: Take all medications as prescribed Attend all scheduled provider appointments Attend church or other social activities Perform all self care  activities independently  Call provider office for new concerns or questions  keep appointment with eye doctor check feet daily for cuts, sores or redness trim toenails straight across drink 6 to 8 glasses of water each day eat fish at least once per week limit fast food meals to no more than 1 per week manage portion size switch to low-fat or skim milk  Follow Up Plan:  Telephone follow up appointment with care management team member scheduled for:  July 2023 The patient has been provided with contact information for the care management team and has been advised to call with any health related questions or concerns.        Raina Mina, RN Care Management Coordinator [place] Office [phone]

## 2021-08-27 ENCOUNTER — Telehealth: Payer: Self-pay

## 2021-08-27 DIAGNOSIS — Z7902 Long term (current) use of antithrombotics/antiplatelets: Secondary | ICD-10-CM | POA: Diagnosis not present

## 2021-08-27 DIAGNOSIS — I5032 Chronic diastolic (congestive) heart failure: Secondary | ICD-10-CM | POA: Diagnosis not present

## 2021-08-27 DIAGNOSIS — J441 Chronic obstructive pulmonary disease with (acute) exacerbation: Secondary | ICD-10-CM | POA: Diagnosis not present

## 2021-08-27 DIAGNOSIS — N179 Acute kidney failure, unspecified: Secondary | ICD-10-CM | POA: Diagnosis not present

## 2021-08-27 DIAGNOSIS — G4733 Obstructive sleep apnea (adult) (pediatric): Secondary | ICD-10-CM | POA: Diagnosis not present

## 2021-08-27 DIAGNOSIS — N1832 Chronic kidney disease, stage 3b: Secondary | ICD-10-CM | POA: Diagnosis not present

## 2021-08-27 DIAGNOSIS — Z7984 Long term (current) use of oral hypoglycemic drugs: Secondary | ICD-10-CM | POA: Diagnosis not present

## 2021-08-27 DIAGNOSIS — E1122 Type 2 diabetes mellitus with diabetic chronic kidney disease: Secondary | ICD-10-CM | POA: Diagnosis not present

## 2021-08-27 DIAGNOSIS — I69351 Hemiplegia and hemiparesis following cerebral infarction affecting right dominant side: Secondary | ICD-10-CM | POA: Diagnosis not present

## 2021-08-27 DIAGNOSIS — I130 Hypertensive heart and chronic kidney disease with heart failure and stage 1 through stage 4 chronic kidney disease, or unspecified chronic kidney disease: Secondary | ICD-10-CM | POA: Diagnosis not present

## 2021-08-27 DIAGNOSIS — Z7951 Long term (current) use of inhaled steroids: Secondary | ICD-10-CM | POA: Diagnosis not present

## 2021-08-27 DIAGNOSIS — L03115 Cellulitis of right lower limb: Secondary | ICD-10-CM | POA: Diagnosis not present

## 2021-08-27 DIAGNOSIS — Z7982 Long term (current) use of aspirin: Secondary | ICD-10-CM | POA: Diagnosis not present

## 2021-08-27 DIAGNOSIS — Z9181 History of falling: Secondary | ICD-10-CM | POA: Diagnosis not present

## 2021-08-27 DIAGNOSIS — Z9981 Dependence on supplemental oxygen: Secondary | ICD-10-CM | POA: Diagnosis not present

## 2021-08-27 NOTE — Telephone Encounter (Signed)
   Telephone encounter was:  Unsuccessful.  08/27/2021 Name: RICE WALSH MRN: [id number] DOB: [birth year]  Unsuccessful outbound call made today to assist with:  Food Insecurity  Outreach Attempt:  2nd Attempt  A HIPAA compliant voice message was left requesting a return call.  Instructed patient to call back at [phone]. Left message on voicemail for patient to return my call regarding food resource email sent. 07/27/21.    Twylla Arceneaux, AAS Paralegal, [place] [facility]  [address], [place] [address] ??[email or web address]  ?? [phone]   [email or web address]

## 2021-08-30 ENCOUNTER — Telehealth: Payer: Self-pay

## 2021-08-30 DIAGNOSIS — N179 Acute kidney failure, unspecified: Secondary | ICD-10-CM | POA: Diagnosis not present

## 2021-08-30 DIAGNOSIS — Z9981 Dependence on supplemental oxygen: Secondary | ICD-10-CM | POA: Diagnosis not present

## 2021-08-30 DIAGNOSIS — Z7951 Long term (current) use of inhaled steroids: Secondary | ICD-10-CM | POA: Diagnosis not present

## 2021-08-30 DIAGNOSIS — Z7984 Long term (current) use of oral hypoglycemic drugs: Secondary | ICD-10-CM | POA: Diagnosis not present

## 2021-08-30 DIAGNOSIS — G4733 Obstructive sleep apnea (adult) (pediatric): Secondary | ICD-10-CM | POA: Diagnosis not present

## 2021-08-30 DIAGNOSIS — I5032 Chronic diastolic (congestive) heart failure: Secondary | ICD-10-CM | POA: Diagnosis not present

## 2021-08-30 DIAGNOSIS — Z7902 Long term (current) use of antithrombotics/antiplatelets: Secondary | ICD-10-CM | POA: Diagnosis not present

## 2021-08-30 DIAGNOSIS — Z9181 History of falling: Secondary | ICD-10-CM | POA: Diagnosis not present

## 2021-08-30 DIAGNOSIS — I69351 Hemiplegia and hemiparesis following cerebral infarction affecting right dominant side: Secondary | ICD-10-CM | POA: Diagnosis not present

## 2021-08-30 DIAGNOSIS — Z7982 Long term (current) use of aspirin: Secondary | ICD-10-CM | POA: Diagnosis not present

## 2021-08-30 DIAGNOSIS — J441 Chronic obstructive pulmonary disease with (acute) exacerbation: Secondary | ICD-10-CM | POA: Diagnosis not present

## 2021-08-30 DIAGNOSIS — I130 Hypertensive heart and chronic kidney disease with heart failure and stage 1 through stage 4 chronic kidney disease, or unspecified chronic kidney disease: Secondary | ICD-10-CM | POA: Diagnosis not present

## 2021-08-30 DIAGNOSIS — E1122 Type 2 diabetes mellitus with diabetic chronic kidney disease: Secondary | ICD-10-CM | POA: Diagnosis not present

## 2021-08-30 DIAGNOSIS — N1832 Chronic kidney disease, stage 3b: Secondary | ICD-10-CM | POA: Diagnosis not present

## 2021-08-30 DIAGNOSIS — L03115 Cellulitis of right lower limb: Secondary | ICD-10-CM | POA: Diagnosis not present

## 2021-08-30 NOTE — Telephone Encounter (Signed)
   Telephone encounter was:  Successful.  08/30/2021 Name: Craig Knight MRN: [id number] DOB: 15-Oct-1965  [name] is a 56 y.o. year old male who is a primary care patient of Fanta, Normajean Baxter, MD . The community resource team was consulted for assistance with [name] guide performed the following interventions: Received voicemail message from patient's wife [name], she has received the emailed food resources. No further assistance is needed at this time.  Follow Up Plan:  No further follow up planned at this time. The patient has been provided with needed resources.  Kim Lauver, AAS Paralegal, [facility]  [address], [place] [address] ??[email or web address]  ?? [phone]   [email or web address]

## 2021-08-30 NOTE — Telephone Encounter (Signed)
[name] was not able to get his Botox injection on 08/17/2021 because of low blood pressure right leg pain &  and cellulitis. He is now on for 09/07/2021 for Botox.  Please review Urgent Care not and advise. If he needs to push his appointment out further.   Per patient wife the leg is better but he cannot see his PCP until the end of the month.

## 2021-08-31 DIAGNOSIS — N189 Chronic kidney disease, unspecified: Secondary | ICD-10-CM | POA: Diagnosis not present

## 2021-08-31 DIAGNOSIS — I129 Hypertensive chronic kidney disease with stage 1 through stage 4 chronic kidney disease, or unspecified chronic kidney disease: Secondary | ICD-10-CM | POA: Diagnosis not present

## 2021-08-31 DIAGNOSIS — E1122 Type 2 diabetes mellitus with diabetic chronic kidney disease: Secondary | ICD-10-CM | POA: Diagnosis not present

## 2021-08-31 DIAGNOSIS — I5032 Chronic diastolic (congestive) heart failure: Secondary | ICD-10-CM | POA: Diagnosis not present

## 2021-08-31 DIAGNOSIS — N170 Acute kidney failure with tubular necrosis: Secondary | ICD-10-CM | POA: Diagnosis not present

## 2021-09-03 DIAGNOSIS — N1832 Chronic kidney disease, stage 3b: Secondary | ICD-10-CM | POA: Diagnosis not present

## 2021-09-03 DIAGNOSIS — N179 Acute kidney failure, unspecified: Secondary | ICD-10-CM | POA: Diagnosis not present

## 2021-09-03 DIAGNOSIS — I69351 Hemiplegia and hemiparesis following cerebral infarction affecting right dominant side: Secondary | ICD-10-CM | POA: Diagnosis not present

## 2021-09-03 DIAGNOSIS — Z7951 Long term (current) use of inhaled steroids: Secondary | ICD-10-CM | POA: Diagnosis not present

## 2021-09-03 DIAGNOSIS — Z7982 Long term (current) use of aspirin: Secondary | ICD-10-CM | POA: Diagnosis not present

## 2021-09-03 DIAGNOSIS — J441 Chronic obstructive pulmonary disease with (acute) exacerbation: Secondary | ICD-10-CM | POA: Diagnosis not present

## 2021-09-03 DIAGNOSIS — E1122 Type 2 diabetes mellitus with diabetic chronic kidney disease: Secondary | ICD-10-CM | POA: Diagnosis not present

## 2021-09-03 DIAGNOSIS — Z9981 Dependence on supplemental oxygen: Secondary | ICD-10-CM | POA: Diagnosis not present

## 2021-09-03 DIAGNOSIS — L03115 Cellulitis of right lower limb: Secondary | ICD-10-CM | POA: Diagnosis not present

## 2021-09-03 DIAGNOSIS — Z7984 Long term (current) use of oral hypoglycemic drugs: Secondary | ICD-10-CM | POA: Diagnosis not present

## 2021-09-03 DIAGNOSIS — Z9181 History of falling: Secondary | ICD-10-CM | POA: Diagnosis not present

## 2021-09-03 DIAGNOSIS — Z7902 Long term (current) use of antithrombotics/antiplatelets: Secondary | ICD-10-CM | POA: Diagnosis not present

## 2021-09-03 DIAGNOSIS — G4733 Obstructive sleep apnea (adult) (pediatric): Secondary | ICD-10-CM | POA: Diagnosis not present

## 2021-09-03 DIAGNOSIS — I130 Hypertensive heart and chronic kidney disease with heart failure and stage 1 through stage 4 chronic kidney disease, or unspecified chronic kidney disease: Secondary | ICD-10-CM | POA: Diagnosis not present

## 2021-09-03 DIAGNOSIS — I5032 Chronic diastolic (congestive) heart failure: Secondary | ICD-10-CM | POA: Diagnosis not present

## 2021-09-04 NOTE — Telephone Encounter (Signed)
Patient informed. He will be here on 09/13/2021.

## 2021-09-05 DIAGNOSIS — I129 Hypertensive chronic kidney disease with stage 1 through stage 4 chronic kidney disease, or unspecified chronic kidney disease: Secondary | ICD-10-CM | POA: Diagnosis not present

## 2021-09-05 DIAGNOSIS — G4733 Obstructive sleep apnea (adult) (pediatric): Secondary | ICD-10-CM | POA: Diagnosis not present

## 2021-09-05 DIAGNOSIS — I5032 Chronic diastolic (congestive) heart failure: Secondary | ICD-10-CM | POA: Diagnosis not present

## 2021-09-05 DIAGNOSIS — Z7984 Long term (current) use of oral hypoglycemic drugs: Secondary | ICD-10-CM | POA: Diagnosis not present

## 2021-09-05 DIAGNOSIS — L03115 Cellulitis of right lower limb: Secondary | ICD-10-CM | POA: Diagnosis not present

## 2021-09-05 DIAGNOSIS — I69351 Hemiplegia and hemiparesis following cerebral infarction affecting right dominant side: Secondary | ICD-10-CM | POA: Diagnosis not present

## 2021-09-05 DIAGNOSIS — Z7982 Long term (current) use of aspirin: Secondary | ICD-10-CM | POA: Diagnosis not present

## 2021-09-05 DIAGNOSIS — J441 Chronic obstructive pulmonary disease with (acute) exacerbation: Secondary | ICD-10-CM | POA: Diagnosis not present

## 2021-09-05 DIAGNOSIS — N170 Acute kidney failure with tubular necrosis: Secondary | ICD-10-CM | POA: Diagnosis not present

## 2021-09-05 DIAGNOSIS — N189 Chronic kidney disease, unspecified: Secondary | ICD-10-CM | POA: Diagnosis not present

## 2021-09-05 DIAGNOSIS — Z9181 History of falling: Secondary | ICD-10-CM | POA: Diagnosis not present

## 2021-09-05 DIAGNOSIS — Z9981 Dependence on supplemental oxygen: Secondary | ICD-10-CM | POA: Diagnosis not present

## 2021-09-05 DIAGNOSIS — I130 Hypertensive heart and chronic kidney disease with heart failure and stage 1 through stage 4 chronic kidney disease, or unspecified chronic kidney disease: Secondary | ICD-10-CM | POA: Diagnosis not present

## 2021-09-05 DIAGNOSIS — R778 Other specified abnormalities of plasma proteins: Secondary | ICD-10-CM | POA: Diagnosis not present

## 2021-09-05 DIAGNOSIS — N1832 Chronic kidney disease, stage 3b: Secondary | ICD-10-CM | POA: Diagnosis not present

## 2021-09-05 DIAGNOSIS — N179 Acute kidney failure, unspecified: Secondary | ICD-10-CM | POA: Diagnosis not present

## 2021-09-05 DIAGNOSIS — E1122 Type 2 diabetes mellitus with diabetic chronic kidney disease: Secondary | ICD-10-CM | POA: Diagnosis not present

## 2021-09-05 DIAGNOSIS — Z7902 Long term (current) use of antithrombotics/antiplatelets: Secondary | ICD-10-CM | POA: Diagnosis not present

## 2021-09-05 DIAGNOSIS — Z7951 Long term (current) use of inhaled steroids: Secondary | ICD-10-CM | POA: Diagnosis not present

## 2021-09-06 ENCOUNTER — Encounter: Payer: Self-pay | Admitting: *Deleted

## 2021-09-06 ENCOUNTER — Ambulatory Visit: Payer: Medicare Other | Admitting: Physical Medicine & Rehabilitation

## 2021-09-06 ENCOUNTER — Other Ambulatory Visit: Payer: Self-pay | Admitting: *Deleted

## 2021-09-06 NOTE — Patient Outreach (Signed)
  Care Coordination   Follow Up Visit Note   09/06/2021 Name: Craig Knight MRN: [id number] DOB: 1965-07-25  Craig Knight is a 56 y.o. year old male who sees Fanta, Normajean Baxter, MD for primary care. I spoke with  Craig Knight by phone today  What matters to the patients health and wellness today?  No needs presented today.   Goals Addressed               This Visit's Progress     No needs presented at this time (pt-stated)        Care Coordination Interventions: Advised patient to Inquire with the provider on gaps of care reviewed today if needed.        SDOH assessments and interventions completed:   Yes No needs related to SDOH. Gaps of care reviewed and pt informed to inquire further with his providers on the missing gaps.  Care Coordination Interventions Activated:  Yes Care Coordination Interventions:   Yes, provided  Follow up plan: No further intervention required.  Encounter Outcome:  Pt. Visit Completed   Raina Mina, RN Care Management Coordinator [facility] [phone]

## 2021-09-07 ENCOUNTER — Encounter: Payer: Medicare Other | Admitting: Physical Medicine & Rehabilitation

## 2021-09-10 DIAGNOSIS — G8190 Hemiplegia, unspecified affecting unspecified side: Secondary | ICD-10-CM | POA: Diagnosis not present

## 2021-09-10 DIAGNOSIS — I5032 Chronic diastolic (congestive) heart failure: Secondary | ICD-10-CM | POA: Diagnosis not present

## 2021-09-10 DIAGNOSIS — Z7951 Long term (current) use of inhaled steroids: Secondary | ICD-10-CM | POA: Diagnosis not present

## 2021-09-10 DIAGNOSIS — Z9181 History of falling: Secondary | ICD-10-CM | POA: Diagnosis not present

## 2021-09-10 DIAGNOSIS — N1832 Chronic kidney disease, stage 3b: Secondary | ICD-10-CM | POA: Diagnosis not present

## 2021-09-10 DIAGNOSIS — L03115 Cellulitis of right lower limb: Secondary | ICD-10-CM | POA: Diagnosis not present

## 2021-09-10 DIAGNOSIS — N179 Acute kidney failure, unspecified: Secondary | ICD-10-CM | POA: Diagnosis not present

## 2021-09-10 DIAGNOSIS — I130 Hypertensive heart and chronic kidney disease with heart failure and stage 1 through stage 4 chronic kidney disease, or unspecified chronic kidney disease: Secondary | ICD-10-CM | POA: Diagnosis not present

## 2021-09-10 DIAGNOSIS — E1165 Type 2 diabetes mellitus with hyperglycemia: Secondary | ICD-10-CM | POA: Diagnosis not present

## 2021-09-10 DIAGNOSIS — E1122 Type 2 diabetes mellitus with diabetic chronic kidney disease: Secondary | ICD-10-CM | POA: Diagnosis not present

## 2021-09-10 DIAGNOSIS — G4733 Obstructive sleep apnea (adult) (pediatric): Secondary | ICD-10-CM | POA: Diagnosis not present

## 2021-09-10 DIAGNOSIS — I69351 Hemiplegia and hemiparesis following cerebral infarction affecting right dominant side: Secondary | ICD-10-CM | POA: Diagnosis not present

## 2021-09-10 DIAGNOSIS — Z7982 Long term (current) use of aspirin: Secondary | ICD-10-CM | POA: Diagnosis not present

## 2021-09-10 DIAGNOSIS — Z7902 Long term (current) use of antithrombotics/antiplatelets: Secondary | ICD-10-CM | POA: Diagnosis not present

## 2021-09-10 DIAGNOSIS — N1831 Chronic kidney disease, stage 3a: Secondary | ICD-10-CM | POA: Diagnosis not present

## 2021-09-10 DIAGNOSIS — J449 Chronic obstructive pulmonary disease, unspecified: Secondary | ICD-10-CM | POA: Diagnosis not present

## 2021-09-10 DIAGNOSIS — Z7984 Long term (current) use of oral hypoglycemic drugs: Secondary | ICD-10-CM | POA: Diagnosis not present

## 2021-09-10 DIAGNOSIS — J441 Chronic obstructive pulmonary disease with (acute) exacerbation: Secondary | ICD-10-CM | POA: Diagnosis not present

## 2021-09-10 DIAGNOSIS — I10 Essential (primary) hypertension: Secondary | ICD-10-CM | POA: Diagnosis not present

## 2021-09-10 DIAGNOSIS — Z9981 Dependence on supplemental oxygen: Secondary | ICD-10-CM | POA: Diagnosis not present

## 2021-09-11 DIAGNOSIS — N1832 Chronic kidney disease, stage 3b: Secondary | ICD-10-CM | POA: Diagnosis not present

## 2021-09-11 DIAGNOSIS — Z7984 Long term (current) use of oral hypoglycemic drugs: Secondary | ICD-10-CM | POA: Diagnosis not present

## 2021-09-11 DIAGNOSIS — I69351 Hemiplegia and hemiparesis following cerebral infarction affecting right dominant side: Secondary | ICD-10-CM | POA: Diagnosis not present

## 2021-09-11 DIAGNOSIS — J441 Chronic obstructive pulmonary disease with (acute) exacerbation: Secondary | ICD-10-CM | POA: Diagnosis not present

## 2021-09-11 DIAGNOSIS — Z7951 Long term (current) use of inhaled steroids: Secondary | ICD-10-CM | POA: Diagnosis not present

## 2021-09-11 DIAGNOSIS — Z7902 Long term (current) use of antithrombotics/antiplatelets: Secondary | ICD-10-CM | POA: Diagnosis not present

## 2021-09-11 DIAGNOSIS — I130 Hypertensive heart and chronic kidney disease with heart failure and stage 1 through stage 4 chronic kidney disease, or unspecified chronic kidney disease: Secondary | ICD-10-CM | POA: Diagnosis not present

## 2021-09-11 DIAGNOSIS — Z9181 History of falling: Secondary | ICD-10-CM | POA: Diagnosis not present

## 2021-09-11 DIAGNOSIS — Z9981 Dependence on supplemental oxygen: Secondary | ICD-10-CM | POA: Diagnosis not present

## 2021-09-11 DIAGNOSIS — L03115 Cellulitis of right lower limb: Secondary | ICD-10-CM | POA: Diagnosis not present

## 2021-09-11 DIAGNOSIS — G4733 Obstructive sleep apnea (adult) (pediatric): Secondary | ICD-10-CM | POA: Diagnosis not present

## 2021-09-11 DIAGNOSIS — Z7982 Long term (current) use of aspirin: Secondary | ICD-10-CM | POA: Diagnosis not present

## 2021-09-11 DIAGNOSIS — N179 Acute kidney failure, unspecified: Secondary | ICD-10-CM | POA: Diagnosis not present

## 2021-09-11 DIAGNOSIS — I5032 Chronic diastolic (congestive) heart failure: Secondary | ICD-10-CM | POA: Diagnosis not present

## 2021-09-11 DIAGNOSIS — E1122 Type 2 diabetes mellitus with diabetic chronic kidney disease: Secondary | ICD-10-CM | POA: Diagnosis not present

## 2021-09-12 DIAGNOSIS — G4733 Obstructive sleep apnea (adult) (pediatric): Secondary | ICD-10-CM | POA: Diagnosis not present

## 2021-09-12 DIAGNOSIS — J441 Chronic obstructive pulmonary disease with (acute) exacerbation: Secondary | ICD-10-CM | POA: Diagnosis not present

## 2021-09-12 DIAGNOSIS — N179 Acute kidney failure, unspecified: Secondary | ICD-10-CM | POA: Diagnosis not present

## 2021-09-12 DIAGNOSIS — I130 Hypertensive heart and chronic kidney disease with heart failure and stage 1 through stage 4 chronic kidney disease, or unspecified chronic kidney disease: Secondary | ICD-10-CM | POA: Diagnosis not present

## 2021-09-12 DIAGNOSIS — Z7902 Long term (current) use of antithrombotics/antiplatelets: Secondary | ICD-10-CM | POA: Diagnosis not present

## 2021-09-12 DIAGNOSIS — Z7984 Long term (current) use of oral hypoglycemic drugs: Secondary | ICD-10-CM | POA: Diagnosis not present

## 2021-09-12 DIAGNOSIS — N1832 Chronic kidney disease, stage 3b: Secondary | ICD-10-CM | POA: Diagnosis not present

## 2021-09-12 DIAGNOSIS — Z7951 Long term (current) use of inhaled steroids: Secondary | ICD-10-CM | POA: Diagnosis not present

## 2021-09-12 DIAGNOSIS — E1122 Type 2 diabetes mellitus with diabetic chronic kidney disease: Secondary | ICD-10-CM | POA: Diagnosis not present

## 2021-09-12 DIAGNOSIS — I69351 Hemiplegia and hemiparesis following cerebral infarction affecting right dominant side: Secondary | ICD-10-CM | POA: Diagnosis not present

## 2021-09-12 DIAGNOSIS — Z9181 History of falling: Secondary | ICD-10-CM | POA: Diagnosis not present

## 2021-09-12 DIAGNOSIS — Z7982 Long term (current) use of aspirin: Secondary | ICD-10-CM | POA: Diagnosis not present

## 2021-09-12 DIAGNOSIS — I5032 Chronic diastolic (congestive) heart failure: Secondary | ICD-10-CM | POA: Diagnosis not present

## 2021-09-12 DIAGNOSIS — L03115 Cellulitis of right lower limb: Secondary | ICD-10-CM | POA: Diagnosis not present

## 2021-09-12 DIAGNOSIS — L988 Other specified disorders of the skin and subcutaneous tissue: Secondary | ICD-10-CM | POA: Diagnosis not present

## 2021-09-12 DIAGNOSIS — L870 Keratosis follicularis et parafollicularis in cutem penetrans: Secondary | ICD-10-CM | POA: Diagnosis not present

## 2021-09-12 DIAGNOSIS — Z9981 Dependence on supplemental oxygen: Secondary | ICD-10-CM | POA: Diagnosis not present

## 2021-09-13 ENCOUNTER — Encounter: Payer: Medicare Other | Attending: Registered Nurse | Admitting: Physical Medicine & Rehabilitation

## 2021-09-13 ENCOUNTER — Encounter: Payer: Self-pay | Admitting: Physical Medicine & Rehabilitation

## 2021-09-13 VITALS — BP 138/89 | HR 76 | Ht 67.0 in | Wt 237.2 lb

## 2021-09-13 DIAGNOSIS — G8111 Spastic hemiplegia affecting right dominant side: Secondary | ICD-10-CM

## 2021-09-13 DIAGNOSIS — I639 Cerebral infarction, unspecified: Secondary | ICD-10-CM | POA: Diagnosis not present

## 2021-09-13 DIAGNOSIS — R609 Edema, unspecified: Secondary | ICD-10-CM | POA: Diagnosis not present

## 2021-09-13 DIAGNOSIS — G8191 Hemiplegia, unspecified affecting right dominant side: Secondary | ICD-10-CM | POA: Diagnosis not present

## 2021-09-13 NOTE — Patient Instructions (Signed)

## 2021-09-13 NOTE — Progress Notes (Signed)
Botox Injection for spasticity using needle EMG guidance  Dilution: 50 Units/ml Indication: Severe spasticity which interferes with ADL,mobility and/or  hygiene and is unresponsive to medication management and other conservative care Informed consent was obtained after describing risks and benefits of the procedure with the patient. This includes bleeding, bruising, infection, excessive weakness, or medication side effects. A REMS form is on file and signed. Needle: 25 g 2" needle electrode Number of units per muscle RIGHT  Quad 200 Gastroc 200 All injections were done after obtaining appropriate EMG activity and after negative drawback for blood. The patient tolerated the procedure well. Post procedure instructions were given. A followup appointment was made.

## 2021-09-17 DIAGNOSIS — Z7984 Long term (current) use of oral hypoglycemic drugs: Secondary | ICD-10-CM | POA: Diagnosis not present

## 2021-09-17 DIAGNOSIS — I5032 Chronic diastolic (congestive) heart failure: Secondary | ICD-10-CM | POA: Diagnosis not present

## 2021-09-17 DIAGNOSIS — I69351 Hemiplegia and hemiparesis following cerebral infarction affecting right dominant side: Secondary | ICD-10-CM | POA: Diagnosis not present

## 2021-09-17 DIAGNOSIS — Z9181 History of falling: Secondary | ICD-10-CM | POA: Diagnosis not present

## 2021-09-17 DIAGNOSIS — G4733 Obstructive sleep apnea (adult) (pediatric): Secondary | ICD-10-CM | POA: Diagnosis not present

## 2021-09-17 DIAGNOSIS — E1122 Type 2 diabetes mellitus with diabetic chronic kidney disease: Secondary | ICD-10-CM | POA: Diagnosis not present

## 2021-09-17 DIAGNOSIS — Z7951 Long term (current) use of inhaled steroids: Secondary | ICD-10-CM | POA: Diagnosis not present

## 2021-09-17 DIAGNOSIS — Z7902 Long term (current) use of antithrombotics/antiplatelets: Secondary | ICD-10-CM | POA: Diagnosis not present

## 2021-09-17 DIAGNOSIS — Z7982 Long term (current) use of aspirin: Secondary | ICD-10-CM | POA: Diagnosis not present

## 2021-09-17 DIAGNOSIS — N179 Acute kidney failure, unspecified: Secondary | ICD-10-CM | POA: Diagnosis not present

## 2021-09-17 DIAGNOSIS — J441 Chronic obstructive pulmonary disease with (acute) exacerbation: Secondary | ICD-10-CM | POA: Diagnosis not present

## 2021-09-17 DIAGNOSIS — I130 Hypertensive heart and chronic kidney disease with heart failure and stage 1 through stage 4 chronic kidney disease, or unspecified chronic kidney disease: Secondary | ICD-10-CM | POA: Diagnosis not present

## 2021-09-17 DIAGNOSIS — Z9981 Dependence on supplemental oxygen: Secondary | ICD-10-CM | POA: Diagnosis not present

## 2021-09-17 DIAGNOSIS — L03115 Cellulitis of right lower limb: Secondary | ICD-10-CM | POA: Diagnosis not present

## 2021-09-17 DIAGNOSIS — N1832 Chronic kidney disease, stage 3b: Secondary | ICD-10-CM | POA: Diagnosis not present

## 2021-09-18 DIAGNOSIS — Z7984 Long term (current) use of oral hypoglycemic drugs: Secondary | ICD-10-CM | POA: Diagnosis not present

## 2021-09-18 DIAGNOSIS — E1122 Type 2 diabetes mellitus with diabetic chronic kidney disease: Secondary | ICD-10-CM | POA: Diagnosis not present

## 2021-09-18 DIAGNOSIS — Z9181 History of falling: Secondary | ICD-10-CM | POA: Diagnosis not present

## 2021-09-18 DIAGNOSIS — L03115 Cellulitis of right lower limb: Secondary | ICD-10-CM | POA: Diagnosis not present

## 2021-09-18 DIAGNOSIS — I69351 Hemiplegia and hemiparesis following cerebral infarction affecting right dominant side: Secondary | ICD-10-CM | POA: Diagnosis not present

## 2021-09-18 DIAGNOSIS — N179 Acute kidney failure, unspecified: Secondary | ICD-10-CM | POA: Diagnosis not present

## 2021-09-18 DIAGNOSIS — N1832 Chronic kidney disease, stage 3b: Secondary | ICD-10-CM | POA: Diagnosis not present

## 2021-09-18 DIAGNOSIS — Z7982 Long term (current) use of aspirin: Secondary | ICD-10-CM | POA: Diagnosis not present

## 2021-09-18 DIAGNOSIS — Z7902 Long term (current) use of antithrombotics/antiplatelets: Secondary | ICD-10-CM | POA: Diagnosis not present

## 2021-09-18 DIAGNOSIS — I130 Hypertensive heart and chronic kidney disease with heart failure and stage 1 through stage 4 chronic kidney disease, or unspecified chronic kidney disease: Secondary | ICD-10-CM | POA: Diagnosis not present

## 2021-09-18 DIAGNOSIS — G4733 Obstructive sleep apnea (adult) (pediatric): Secondary | ICD-10-CM | POA: Diagnosis not present

## 2021-09-18 DIAGNOSIS — J441 Chronic obstructive pulmonary disease with (acute) exacerbation: Secondary | ICD-10-CM | POA: Diagnosis not present

## 2021-09-18 DIAGNOSIS — Z7951 Long term (current) use of inhaled steroids: Secondary | ICD-10-CM | POA: Diagnosis not present

## 2021-09-18 DIAGNOSIS — Z9981 Dependence on supplemental oxygen: Secondary | ICD-10-CM | POA: Diagnosis not present

## 2021-09-18 DIAGNOSIS — I5032 Chronic diastolic (congestive) heart failure: Secondary | ICD-10-CM | POA: Diagnosis not present

## 2021-09-19 DIAGNOSIS — G4733 Obstructive sleep apnea (adult) (pediatric): Secondary | ICD-10-CM | POA: Diagnosis not present

## 2021-09-19 DIAGNOSIS — I5032 Chronic diastolic (congestive) heart failure: Secondary | ICD-10-CM | POA: Diagnosis not present

## 2021-09-19 DIAGNOSIS — Z7982 Long term (current) use of aspirin: Secondary | ICD-10-CM | POA: Diagnosis not present

## 2021-09-19 DIAGNOSIS — Z9181 History of falling: Secondary | ICD-10-CM | POA: Diagnosis not present

## 2021-09-19 DIAGNOSIS — L03115 Cellulitis of right lower limb: Secondary | ICD-10-CM | POA: Diagnosis not present

## 2021-09-19 DIAGNOSIS — N179 Acute kidney failure, unspecified: Secondary | ICD-10-CM | POA: Diagnosis not present

## 2021-09-19 DIAGNOSIS — Z7951 Long term (current) use of inhaled steroids: Secondary | ICD-10-CM | POA: Diagnosis not present

## 2021-09-19 DIAGNOSIS — Z9981 Dependence on supplemental oxygen: Secondary | ICD-10-CM | POA: Diagnosis not present

## 2021-09-19 DIAGNOSIS — Z7984 Long term (current) use of oral hypoglycemic drugs: Secondary | ICD-10-CM | POA: Diagnosis not present

## 2021-09-19 DIAGNOSIS — N1832 Chronic kidney disease, stage 3b: Secondary | ICD-10-CM | POA: Diagnosis not present

## 2021-09-19 DIAGNOSIS — E1122 Type 2 diabetes mellitus with diabetic chronic kidney disease: Secondary | ICD-10-CM | POA: Diagnosis not present

## 2021-09-19 DIAGNOSIS — I130 Hypertensive heart and chronic kidney disease with heart failure and stage 1 through stage 4 chronic kidney disease, or unspecified chronic kidney disease: Secondary | ICD-10-CM | POA: Diagnosis not present

## 2021-09-19 DIAGNOSIS — J441 Chronic obstructive pulmonary disease with (acute) exacerbation: Secondary | ICD-10-CM | POA: Diagnosis not present

## 2021-09-19 DIAGNOSIS — I69351 Hemiplegia and hemiparesis following cerebral infarction affecting right dominant side: Secondary | ICD-10-CM | POA: Diagnosis not present

## 2021-09-19 DIAGNOSIS — Z7902 Long term (current) use of antithrombotics/antiplatelets: Secondary | ICD-10-CM | POA: Diagnosis not present

## 2021-09-24 DIAGNOSIS — L03115 Cellulitis of right lower limb: Secondary | ICD-10-CM | POA: Diagnosis not present

## 2021-09-24 DIAGNOSIS — J441 Chronic obstructive pulmonary disease with (acute) exacerbation: Secondary | ICD-10-CM | POA: Diagnosis not present

## 2021-09-24 DIAGNOSIS — E1122 Type 2 diabetes mellitus with diabetic chronic kidney disease: Secondary | ICD-10-CM | POA: Diagnosis not present

## 2021-09-24 DIAGNOSIS — L870 Keratosis follicularis et parafollicularis in cutem penetrans: Secondary | ICD-10-CM | POA: Diagnosis not present

## 2021-09-24 DIAGNOSIS — I69351 Hemiplegia and hemiparesis following cerebral infarction affecting right dominant side: Secondary | ICD-10-CM | POA: Diagnosis not present

## 2021-09-24 DIAGNOSIS — Z9181 History of falling: Secondary | ICD-10-CM | POA: Diagnosis not present

## 2021-09-24 DIAGNOSIS — G4733 Obstructive sleep apnea (adult) (pediatric): Secondary | ICD-10-CM | POA: Diagnosis not present

## 2021-09-24 DIAGNOSIS — Z7951 Long term (current) use of inhaled steroids: Secondary | ICD-10-CM | POA: Diagnosis not present

## 2021-09-24 DIAGNOSIS — I130 Hypertensive heart and chronic kidney disease with heart failure and stage 1 through stage 4 chronic kidney disease, or unspecified chronic kidney disease: Secondary | ICD-10-CM | POA: Diagnosis not present

## 2021-09-24 DIAGNOSIS — Z7982 Long term (current) use of aspirin: Secondary | ICD-10-CM | POA: Diagnosis not present

## 2021-09-24 DIAGNOSIS — Z7902 Long term (current) use of antithrombotics/antiplatelets: Secondary | ICD-10-CM | POA: Diagnosis not present

## 2021-09-24 DIAGNOSIS — I5032 Chronic diastolic (congestive) heart failure: Secondary | ICD-10-CM | POA: Diagnosis not present

## 2021-09-24 DIAGNOSIS — N179 Acute kidney failure, unspecified: Secondary | ICD-10-CM | POA: Diagnosis not present

## 2021-09-24 DIAGNOSIS — Z9981 Dependence on supplemental oxygen: Secondary | ICD-10-CM | POA: Diagnosis not present

## 2021-09-24 DIAGNOSIS — Z7984 Long term (current) use of oral hypoglycemic drugs: Secondary | ICD-10-CM | POA: Diagnosis not present

## 2021-09-24 DIAGNOSIS — N1832 Chronic kidney disease, stage 3b: Secondary | ICD-10-CM | POA: Diagnosis not present

## 2021-09-26 DIAGNOSIS — Z7951 Long term (current) use of inhaled steroids: Secondary | ICD-10-CM | POA: Diagnosis not present

## 2021-09-26 DIAGNOSIS — I69351 Hemiplegia and hemiparesis following cerebral infarction affecting right dominant side: Secondary | ICD-10-CM | POA: Diagnosis not present

## 2021-09-26 DIAGNOSIS — J441 Chronic obstructive pulmonary disease with (acute) exacerbation: Secondary | ICD-10-CM | POA: Diagnosis not present

## 2021-09-26 DIAGNOSIS — E1122 Type 2 diabetes mellitus with diabetic chronic kidney disease: Secondary | ICD-10-CM | POA: Diagnosis not present

## 2021-09-26 DIAGNOSIS — I130 Hypertensive heart and chronic kidney disease with heart failure and stage 1 through stage 4 chronic kidney disease, or unspecified chronic kidney disease: Secondary | ICD-10-CM | POA: Diagnosis not present

## 2021-09-26 DIAGNOSIS — Z9181 History of falling: Secondary | ICD-10-CM | POA: Diagnosis not present

## 2021-09-26 DIAGNOSIS — N1832 Chronic kidney disease, stage 3b: Secondary | ICD-10-CM | POA: Diagnosis not present

## 2021-09-26 DIAGNOSIS — Z7982 Long term (current) use of aspirin: Secondary | ICD-10-CM | POA: Diagnosis not present

## 2021-09-26 DIAGNOSIS — L03115 Cellulitis of right lower limb: Secondary | ICD-10-CM | POA: Diagnosis not present

## 2021-09-26 DIAGNOSIS — I5032 Chronic diastolic (congestive) heart failure: Secondary | ICD-10-CM | POA: Diagnosis not present

## 2021-09-26 DIAGNOSIS — Z9981 Dependence on supplemental oxygen: Secondary | ICD-10-CM | POA: Diagnosis not present

## 2021-09-26 DIAGNOSIS — N179 Acute kidney failure, unspecified: Secondary | ICD-10-CM | POA: Diagnosis not present

## 2021-09-26 DIAGNOSIS — Z7984 Long term (current) use of oral hypoglycemic drugs: Secondary | ICD-10-CM | POA: Diagnosis not present

## 2021-09-26 DIAGNOSIS — G4733 Obstructive sleep apnea (adult) (pediatric): Secondary | ICD-10-CM | POA: Diagnosis not present

## 2021-09-26 DIAGNOSIS — Z7902 Long term (current) use of antithrombotics/antiplatelets: Secondary | ICD-10-CM | POA: Diagnosis not present

## 2021-10-03 DIAGNOSIS — N1832 Chronic kidney disease, stage 3b: Secondary | ICD-10-CM | POA: Diagnosis not present

## 2021-10-03 DIAGNOSIS — Z9181 History of falling: Secondary | ICD-10-CM | POA: Diagnosis not present

## 2021-10-03 DIAGNOSIS — Z7902 Long term (current) use of antithrombotics/antiplatelets: Secondary | ICD-10-CM | POA: Diagnosis not present

## 2021-10-03 DIAGNOSIS — Z7984 Long term (current) use of oral hypoglycemic drugs: Secondary | ICD-10-CM | POA: Diagnosis not present

## 2021-10-03 DIAGNOSIS — I5032 Chronic diastolic (congestive) heart failure: Secondary | ICD-10-CM | POA: Diagnosis not present

## 2021-10-03 DIAGNOSIS — G4733 Obstructive sleep apnea (adult) (pediatric): Secondary | ICD-10-CM | POA: Diagnosis not present

## 2021-10-03 DIAGNOSIS — Z7951 Long term (current) use of inhaled steroids: Secondary | ICD-10-CM | POA: Diagnosis not present

## 2021-10-03 DIAGNOSIS — E1122 Type 2 diabetes mellitus with diabetic chronic kidney disease: Secondary | ICD-10-CM | POA: Diagnosis not present

## 2021-10-03 DIAGNOSIS — J441 Chronic obstructive pulmonary disease with (acute) exacerbation: Secondary | ICD-10-CM | POA: Diagnosis not present

## 2021-10-03 DIAGNOSIS — N179 Acute kidney failure, unspecified: Secondary | ICD-10-CM | POA: Diagnosis not present

## 2021-10-03 DIAGNOSIS — Z7982 Long term (current) use of aspirin: Secondary | ICD-10-CM | POA: Diagnosis not present

## 2021-10-03 DIAGNOSIS — I69351 Hemiplegia and hemiparesis following cerebral infarction affecting right dominant side: Secondary | ICD-10-CM | POA: Diagnosis not present

## 2021-10-03 DIAGNOSIS — Z9981 Dependence on supplemental oxygen: Secondary | ICD-10-CM | POA: Diagnosis not present

## 2021-10-03 DIAGNOSIS — L03115 Cellulitis of right lower limb: Secondary | ICD-10-CM | POA: Diagnosis not present

## 2021-10-03 DIAGNOSIS — I130 Hypertensive heart and chronic kidney disease with heart failure and stage 1 through stage 4 chronic kidney disease, or unspecified chronic kidney disease: Secondary | ICD-10-CM | POA: Diagnosis not present

## 2021-10-10 DIAGNOSIS — I5032 Chronic diastolic (congestive) heart failure: Secondary | ICD-10-CM | POA: Diagnosis not present

## 2021-10-10 DIAGNOSIS — G8190 Hemiplegia, unspecified affecting unspecified side: Secondary | ICD-10-CM | POA: Diagnosis not present

## 2021-10-10 DIAGNOSIS — J449 Chronic obstructive pulmonary disease, unspecified: Secondary | ICD-10-CM | POA: Diagnosis not present

## 2021-10-10 DIAGNOSIS — E1165 Type 2 diabetes mellitus with hyperglycemia: Secondary | ICD-10-CM | POA: Diagnosis not present

## 2021-10-10 DIAGNOSIS — N1831 Chronic kidney disease, stage 3a: Secondary | ICD-10-CM | POA: Diagnosis not present

## 2021-10-10 DIAGNOSIS — I10 Essential (primary) hypertension: Secondary | ICD-10-CM | POA: Diagnosis not present

## 2021-10-10 DIAGNOSIS — I2510 Atherosclerotic heart disease of native coronary artery without angina pectoris: Secondary | ICD-10-CM | POA: Diagnosis not present

## 2021-10-14 DIAGNOSIS — I639 Cerebral infarction, unspecified: Secondary | ICD-10-CM | POA: Diagnosis not present

## 2021-10-14 DIAGNOSIS — R609 Edema, unspecified: Secondary | ICD-10-CM | POA: Diagnosis not present

## 2021-10-14 DIAGNOSIS — G8191 Hemiplegia, unspecified affecting right dominant side: Secondary | ICD-10-CM | POA: Diagnosis not present

## 2021-10-25 ENCOUNTER — Encounter: Payer: Medicare Other | Attending: Registered Nurse | Admitting: Physical Medicine & Rehabilitation

## 2021-10-25 ENCOUNTER — Encounter: Payer: Self-pay | Admitting: Physical Medicine & Rehabilitation

## 2021-10-25 VITALS — BP 123/88 | HR 73 | Ht 67.0 in | Wt 235.0 lb

## 2021-10-25 DIAGNOSIS — I4891 Unspecified atrial fibrillation: Secondary | ICD-10-CM | POA: Diagnosis not present

## 2021-10-25 DIAGNOSIS — G8111 Spastic hemiplegia affecting right dominant side: Secondary | ICD-10-CM | POA: Insufficient documentation

## 2021-10-25 DIAGNOSIS — I5032 Chronic diastolic (congestive) heart failure: Secondary | ICD-10-CM | POA: Diagnosis not present

## 2021-10-25 DIAGNOSIS — F321 Major depressive disorder, single episode, moderate: Secondary | ICD-10-CM | POA: Diagnosis present

## 2021-10-25 NOTE — Progress Notes (Signed)
Subjective:     Patient ID: Craig Knight, male   DOB: 1965/06/07, 56 y.o.   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  Prior history of right PCA infarct 09/07/18 22 The patient underwent a right lower extremity botulinum toxin injection on 09/13/21, right quadricep 200 units, right gastrocnemius 200 units.  No postprocedural complications.  He feels like his leg spasticity is doing better but notes increasing right arm spasticity. pain Inventory Average Pain 0 Pain Right Now 0 My pain is intermittent and tingling  LOCATION OF PAIN  Right arm  BOWEL Number of stools per week: 7   BLADDER Normal I   Mobility use a walker how many minutes can you walk? Unknown with walker ability to climb steps?  yes do you drive?  no use a wheelchair transfers alone Do you have any goals in this area?  yes  Function disabled: date disabled 2022 I need assistance with the following:  dressing, bathing, meal prep, household duties, and shopping Do you have any goals in this area?  yes  Neuro/Psych weakness tremor trouble walking spasms loss of taste or smell  Prior Studies Any changes since last visit?  no  Physicians involved in your care Any changes since last visit?  no   Family History  Problem Relation Age of Onset   Stroke Mother    Heart attack Father 21       CABG   Hypertension Sister    Stroke Brother    Hypertension Brother    Stomach cancer Brother    Stroke Maternal Grandmother    Heart attack Maternal Grandfather    Heart attack Paternal Grandmother    Social History   Socioeconomic History   Marital status: Married    Spouse name: Not on file   Number of children: Not on file   Years of education: Not on file   Highest education level: Not on file  Occupational History   Not on file  Tobacco Use   Smoking status: Former    Packs/day: 0.25    Types: Cigarettes    Smokeless tobacco: Never   Tobacco comments:    Quitting per patient  Vaping Use   Vaping Use: Never used  Substance and Sexual Activity   Alcohol use: Yes    Alcohol/week: 0.0 standard drinks of alcohol    Comment: occasionally   Drug use: No   Sexual activity: Yes    Birth control/protection: None  Other Topics Concern   Not on file  Social History Narrative   Not on file   Social Determinants of Health   Financial Resource Strain: Not on file  Food Insecurity: Food Insecurity Present (08/22/2021)   Hunger Vital Sign    Worried About Running Out of Food in the Last Year: Sometimes true    Ran Out of Food in the Last Year: Never true  Transportation Needs: No Transportation Needs (08/21/2021)   PRAPARE - Hydrologist (Medical): No    Lack of Transportation (Non-Medical): No  Physical Activity: Not on file  Stress: Not on file  Social Connections: Not on file   Past Surgical History:  Procedure Laterality Date   BUBBLE STUDY  09/18/2020   Procedure: BUBBLE STUDY;  Surgeon: Pixie Casino, MD;  Location: [place];  Service: Cardiovascular;;   COLONOSCOPY N/A 09/18/2016   Procedure: COLONOSCOPY;  Surgeon: Barney Drain  L, MD;  [facility];  Service: Endoscopy;  Laterality: N/A;  10:30 AM   lipoma removal     TEE WITHOUT CARDIOVERSION N/A 09/18/2020   Procedure: TRANSESOPHAGEAL ECHOCARDIOGRAM (TEE);  Surgeon: Pixie Casino, MD;  Location: [facility] ENDOSCOPY;  Service: Cardiovascular;  Laterality: N/A;   Past Medical History:  Diagnosis Date   Asthma    Bronchitis    Cellulitis    COPD (chronic obstructive pulmonary disease) ([place])    Diabetes mellitus without complication ([place])    Heart murmur 40/97/3532   soft systolic murmur 1/6   Hypertension    Sleep apnea    Stroke (HCC)    BP 123/88   Pulse 73   Ht '5\' 7"'$  (1.702 m)   Wt 235 lb (106.6 kg)   SpO2 95%   BMI 36.81 kg/m   Opioid Risk Score:   Fall Risk Score:   `1  Depression screen PHQ 2/9     09/13/2021    1:14 PM 08/21/2021    3:21 PM 08/17/2021   10:34 AM 06/26/2021    3:01 PM 03/29/2021    2:19 PM 12/01/2020   10:18 AM 07/03/2016    3:16 PM  Depression screen PHQ 2/9  Decreased Interest 0 0 0 1 0 0 2  Down, Depressed, Hopeless 0 0 0 1 0 0 2  PHQ - 2 Score 0 0 0 2 0 0 4  Altered sleeping      0 3  Tired, decreased energy      1 2  Change in appetite      0 0  Feeling bad or failure about yourself       0 0  Trouble concentrating      0 0  Moving slowly or fidgety/restless      0 0  Suicidal thoughts      0 0  PHQ-9 Score      1 9    Review of Systems  Constitutional:        Loss of taste  Musculoskeletal:  Positive for gait problem.       Spasms  Neurological:  Positive for tremors, weakness and numbness.  All other systems reviewed and are negative.      Objective:   Physical Exam Motor strength to minus at the right elbow flexors trace deltoid, 0 at the finger extensors trace finger flexors 3 - right hip flexor and knee extensor 0 at the ankle Tone MAS 2 at the elbow flexors MAS 3 at the wrist and finger flexors MAS 0 at the quads MAS 2 at the gastrocnemius    Assessment:     1.  Right spastic hemiplegia secondary to left basal ganglia infarct approximately 1 year ago, increasing tone right upper extremity will recommend botulinum toxin injection for this.  He underwent right lower extremity injection which was helpful in alleviating tone    Plan:      Dysport injection in 6 weeks Right  FDP 200 FDS 200 FPL 200  Right quad  400U Gastroc 400U Soleus 100 units

## 2021-11-10 DIAGNOSIS — N1831 Chronic kidney disease, stage 3a: Secondary | ICD-10-CM | POA: Diagnosis not present

## 2021-11-10 DIAGNOSIS — E118 Type 2 diabetes mellitus with unspecified complications: Secondary | ICD-10-CM | POA: Diagnosis not present

## 2021-11-14 DIAGNOSIS — I639 Cerebral infarction, unspecified: Secondary | ICD-10-CM | POA: Diagnosis not present

## 2021-11-14 DIAGNOSIS — R609 Edema, unspecified: Secondary | ICD-10-CM | POA: Diagnosis not present

## 2021-11-14 DIAGNOSIS — G8191 Hemiplegia, unspecified affecting right dominant side: Secondary | ICD-10-CM | POA: Diagnosis not present

## 2021-11-30 DIAGNOSIS — N1831 Chronic kidney disease, stage 3a: Secondary | ICD-10-CM | POA: Diagnosis not present

## 2021-11-30 DIAGNOSIS — I5032 Chronic diastolic (congestive) heart failure: Secondary | ICD-10-CM | POA: Diagnosis not present

## 2021-11-30 DIAGNOSIS — G8190 Hemiplegia, unspecified affecting unspecified side: Secondary | ICD-10-CM | POA: Diagnosis not present

## 2021-11-30 DIAGNOSIS — E118 Type 2 diabetes mellitus with unspecified complications: Secondary | ICD-10-CM | POA: Diagnosis not present

## 2021-11-30 DIAGNOSIS — I10 Essential (primary) hypertension: Secondary | ICD-10-CM | POA: Diagnosis not present

## 2021-12-07 DIAGNOSIS — N189 Chronic kidney disease, unspecified: Secondary | ICD-10-CM | POA: Diagnosis not present

## 2021-12-07 DIAGNOSIS — N170 Acute kidney failure with tubular necrosis: Secondary | ICD-10-CM | POA: Diagnosis not present

## 2021-12-07 DIAGNOSIS — I129 Hypertensive chronic kidney disease with stage 1 through stage 4 chronic kidney disease, or unspecified chronic kidney disease: Secondary | ICD-10-CM | POA: Diagnosis not present

## 2021-12-07 DIAGNOSIS — E1122 Type 2 diabetes mellitus with diabetic chronic kidney disease: Secondary | ICD-10-CM | POA: Diagnosis not present

## 2021-12-07 DIAGNOSIS — I5032 Chronic diastolic (congestive) heart failure: Secondary | ICD-10-CM | POA: Diagnosis not present

## 2021-12-12 DIAGNOSIS — E1122 Type 2 diabetes mellitus with diabetic chronic kidney disease: Secondary | ICD-10-CM | POA: Diagnosis not present

## 2021-12-12 DIAGNOSIS — I129 Hypertensive chronic kidney disease with stage 1 through stage 4 chronic kidney disease, or unspecified chronic kidney disease: Secondary | ICD-10-CM | POA: Diagnosis not present

## 2021-12-12 DIAGNOSIS — R809 Proteinuria, unspecified: Secondary | ICD-10-CM | POA: Diagnosis not present

## 2021-12-12 DIAGNOSIS — K5909 Other constipation: Secondary | ICD-10-CM | POA: Diagnosis not present

## 2021-12-12 DIAGNOSIS — N189 Chronic kidney disease, unspecified: Secondary | ICD-10-CM | POA: Diagnosis not present

## 2021-12-12 DIAGNOSIS — I5032 Chronic diastolic (congestive) heart failure: Secondary | ICD-10-CM | POA: Diagnosis not present

## 2021-12-12 DIAGNOSIS — E1129 Type 2 diabetes mellitus with other diabetic kidney complication: Secondary | ICD-10-CM | POA: Diagnosis not present

## 2021-12-14 DIAGNOSIS — G8191 Hemiplegia, unspecified affecting right dominant side: Secondary | ICD-10-CM | POA: Diagnosis not present

## 2021-12-14 DIAGNOSIS — I639 Cerebral infarction, unspecified: Secondary | ICD-10-CM | POA: Diagnosis not present

## 2021-12-14 DIAGNOSIS — R609 Edema, unspecified: Secondary | ICD-10-CM | POA: Diagnosis not present

## 2021-12-18 ENCOUNTER — Encounter: Payer: Medicare Other | Attending: Registered Nurse | Admitting: Physical Medicine & Rehabilitation

## 2021-12-18 ENCOUNTER — Encounter: Payer: Self-pay | Admitting: Physical Medicine & Rehabilitation

## 2021-12-18 VITALS — BP 129/81 | HR 94 | Temp 98.8°F | Ht 67.0 in

## 2021-12-18 DIAGNOSIS — G8111 Spastic hemiplegia affecting right dominant side: Secondary | ICD-10-CM | POA: Insufficient documentation

## 2021-12-18 MED ORDER — ABOBOTULINUMTOXINA 500 UNITS IM SOLR
1500.0000 [IU] | Freq: Once | INTRAMUSCULAR | Status: AC
  Administered 2021-12-18: 1500 [IU] via INTRAMUSCULAR

## 2021-12-18 NOTE — Patient Instructions (Signed)

## 2021-12-18 NOTE — Progress Notes (Addendum)
Dysport Injection for spasticity using needle EMG guidance  Dilution: 200 Units/ml Indication: Severe spasticity which interferes with ADL,mobility and/or  hygiene and is unresponsive to medication management and other conservative care Informed consent was obtained after describing risks and benefits of the procedure with the patient. This includes bleeding, bruising, infection, excessive weakness, or medication side effects. A REMS form is on file and signed. Needle:  needle electrode Number of units per muscle Right  FDP 200 FDS 200 FPL 200 Pronators 202  Right lower ext VMO 100 VL 100 VI 100 RF 100 Gastroc 200U Soleus 100 units All injections were done after obtaining appropriate EMG activity and after negative drawback for blood. The patient tolerated the procedure well. Post procedure instructions were given. A followup appointment was made.

## 2021-12-31 DIAGNOSIS — N1831 Chronic kidney disease, stage 3a: Secondary | ICD-10-CM | POA: Diagnosis not present

## 2021-12-31 DIAGNOSIS — I10 Essential (primary) hypertension: Secondary | ICD-10-CM | POA: Diagnosis not present

## 2022-01-14 DIAGNOSIS — I639 Cerebral infarction, unspecified: Secondary | ICD-10-CM | POA: Diagnosis not present

## 2022-01-14 DIAGNOSIS — R609 Edema, unspecified: Secondary | ICD-10-CM | POA: Diagnosis not present

## 2022-01-14 DIAGNOSIS — G8191 Hemiplegia, unspecified affecting right dominant side: Secondary | ICD-10-CM | POA: Diagnosis not present

## 2022-01-30 DIAGNOSIS — I5032 Chronic diastolic (congestive) heart failure: Secondary | ICD-10-CM | POA: Diagnosis not present

## 2022-01-30 DIAGNOSIS — I10 Essential (primary) hypertension: Secondary | ICD-10-CM | POA: Diagnosis not present

## 2022-01-31 ENCOUNTER — Encounter: Payer: Self-pay | Admitting: Physical Medicine & Rehabilitation

## 2022-01-31 ENCOUNTER — Encounter: Payer: Medicare Other | Attending: Registered Nurse | Admitting: Physical Medicine & Rehabilitation

## 2022-01-31 VITALS — BP 133/90 | HR 67 | Ht 67.0 in

## 2022-01-31 DIAGNOSIS — G8111 Spastic hemiplegia affecting right dominant side: Secondary | ICD-10-CM | POA: Diagnosis not present

## 2022-01-31 NOTE — Progress Notes (Signed)
Subjective:    Patient ID: Craig Knight, male    DOB: 11/19/1965, 56 y.o.   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  He has completed inpatient and outpatient rehabilitation. He continues to exhibit right spastic hemiplegia affecting primarily finger flexors in the right upper limb and quadricep in the right lower limb. Since the injection performed 6 weeks ago both he and his wife have noted improvement in finger flexor spasticity as well as shaking of the right lower extremity.  The shaking is described as involving the knee with extension motion. Not having much problem in the right ankle or foot in terms of spasms. No falls Ambulates with a hemiwalker short household distances Wheelchair for community distances. Patient wishes to discuss prognosis for further improvement.  Right  FDP 200 FDS 200 FPL 200 Pronators 202   Right lower ext VMO 100 VL 100 VI 100 RF 100 Gastroc 200U Soleus 100 units Pain Inventory Average Pain 0 Pain Right Now 0 My pain is  no pain  LOCATION OF PAIN  no pain  BOWEL Number of stools per week: 2   BLADDER Normal    Mobility use a walker how many minutes can you walk? 10 ability to climb steps?  no do you drive?  no use a wheelchair needs help with transfers Do you have any goals in this area?  yes  Function disabled: date disabled . I need assistance with the following:  dressing and household duties  Neuro/Psych trouble walking  Prior Studies Any changes since last visit?  no  Physicians involved in your care Any changes since last visit?  no   Family History  Problem Relation Age of Onset   Stroke Mother    Heart attack Father 59       CABG   Hypertension Sister    Stroke Brother    Hypertension Brother    Stomach cancer Brother    Stroke Maternal Grandmother    Heart attack Maternal Grandfather    Heart attack Paternal Grandmother    Social  History   Socioeconomic History   Marital status: Married    Spouse name: Not on file   Number of children: Not on file   Years of education: Not on file   Highest education level: Not on file  Occupational History   Not on file  Tobacco Use   Smoking status: Former    Packs/day: 0.25    Types: Cigarettes   Smokeless tobacco: Never   Tobacco comments:    Quitting per patient  Vaping Use   Vaping Use: Never used  Substance and Sexual Activity   Alcohol use: Yes    Alcohol/week: 0.0 standard drinks of alcohol    Comment: occasionally   Drug use: No   Sexual activity: Yes    Birth control/protection: None  Other Topics Concern   Not on file  Social History Narrative   Not on file   Social Determinants of Health   Financial Resource Strain: Not on file  Food Insecurity: Food Insecurity Present (08/22/2021)   Hunger Vital Sign    Worried About Running Out of Food in the Last Year: Sometimes true    Ran Out of Food in the Last Year: Never true  Transportation Needs: No Transportation Needs (08/21/2021)   PRAPARE - Hydrologist (Medical): No    Lack of Transportation (Non-Medical): No  Physical Activity: Not on  file  Stress: Not on file  Social Connections: Not on file   Past Surgical History:  Procedure Laterality Date   BUBBLE STUDY  09/18/2020   Procedure: BUBBLE STUDY;  Surgeon: Pixie Casino, MD;  Location: [place];  Service: Cardiovascular;;   COLONOSCOPY N/A 09/18/2016   Procedure: COLONOSCOPY;  Surgeon: Danie Binder, MD;  [facility];  Service: Endoscopy;  Laterality: N/A;  10:30 AM   lipoma removal     TEE WITHOUT CARDIOVERSION N/A 09/18/2020   Procedure: TRANSESOPHAGEAL ECHOCARDIOGRAM (TEE);  Surgeon: Pixie Casino, MD;  Location[facility] ENDOSCOPY;  Service: Cardiovascular;  Laterality: N/A;   Past Medical History:  Diagnosis Date   Asthma    Bronchitis    Cellulitis    COPD (chronic obstructive pulmonary  disease) ([place])    Diabetes mellitus without complication ([place])    Heart murmur 71/07/2692   soft systolic murmur 1/6   Hypertension    Sleep apnea    Stroke (HCC)    BP (!) 133/90   Pulse 67   Ht '5\' 7"'$  (1.702 m)   SpO2 96%   BMI 36.81 kg/m   Opioid Risk Score:   Fall Risk Score:  `1  Depression screen PHQ 2/9     01/31/2022    1:30 PM 12/18/2021    2:04 PM 10/25/2021    1:00 PM 09/13/2021    1:14 PM 08/21/2021    3:21 PM 08/17/2021   10:34 AM 06/26/2021    3:01 PM  Depression screen PHQ 2/9  Decreased Interest 0 0 0 0 0 0 1  Down, Depressed, Hopeless 0 0 0 0 0 0 1  PHQ - 2 Score 0 0 0 0 0 0 2      Review of Systems  Musculoskeletal:  Positive for gait problem.  All other systems reviewed and are negative.     Objective:   Physical Exam  Motor strength is to minus at the right shoulder abductors to minus at the elbow flexors trace elbow extensors 0 right finger flexors and extensors Right lower extremity to minus hip knee extensor synergy 0/5 at the ankle Tone MAS 2 at the right finger flexors and thumb flexor MAS 1 at the wrist flexor MAS 1 at the elbow flexor Right lower extremity tone Has hyperactive reflexes at the patella no evidence of clonus at the ankle no equina varus spasticity noted.  No evidence of hamstring spasticity Mood and affect are appropriate Speech without dysarthria or aphasia      Assessment & Plan:  #1.  History of chronic right spastic hemiplegia.  We discussed that at 18 months post it is unlikely that he will get any significant additional recovery in the right upper limb.  We discussed that he still may improve in terms of his ambulation if he continues to practice. Recommend that wife does pronator stretch for right upper limb Also discussed altering the right lower extremity injection dosing to increase the amount in the quadricep and reduce the gastrosoleus dosing.  Repeat in 6 weeks Dysport Right  FDP 200 FDS 200 FPL  200 Pronators 200- instructed wife on pronator stretch   Right lower ext VMO 200 VL 100 VI 100 RF 200  Soleus 100 units

## 2022-02-13 DIAGNOSIS — I639 Cerebral infarction, unspecified: Secondary | ICD-10-CM | POA: Diagnosis not present

## 2022-02-13 DIAGNOSIS — G8191 Hemiplegia, unspecified affecting right dominant side: Secondary | ICD-10-CM | POA: Diagnosis not present

## 2022-02-13 DIAGNOSIS — R609 Edema, unspecified: Secondary | ICD-10-CM | POA: Diagnosis not present

## 2022-03-02 DIAGNOSIS — I10 Essential (primary) hypertension: Secondary | ICD-10-CM | POA: Diagnosis not present

## 2022-03-02 DIAGNOSIS — N1831 Chronic kidney disease, stage 3a: Secondary | ICD-10-CM | POA: Diagnosis not present

## 2022-03-22 ENCOUNTER — Encounter: Payer: Self-pay | Admitting: Physical Medicine & Rehabilitation

## 2022-03-22 ENCOUNTER — Encounter: Payer: 59 | Attending: Registered Nurse | Admitting: Physical Medicine & Rehabilitation

## 2022-03-22 VITALS — BP 148/93 | HR 65

## 2022-03-22 DIAGNOSIS — G8111 Spastic hemiplegia affecting right dominant side: Secondary | ICD-10-CM

## 2022-03-22 MED ORDER — ABOBOTULINUMTOXINA 500 UNITS IM SOLR
1500.0000 [IU] | Freq: Once | INTRAMUSCULAR | Status: AC
  Administered 2022-03-22: 1500 [IU] via INTRAMUSCULAR

## 2022-03-22 NOTE — Patient Instructions (Signed)

## 2022-03-22 NOTE — Progress Notes (Signed)
Dysport Injection for spasticity using needle EMG guidance  Dilution: 200 Units/ml Indication: Severe spasticity which interferes with ADL,mobility and/or  hygiene and is unresponsive to medication management and other conservative care Informed consent was obtained after describing risks and benefits of the procedure with the patient. This includes bleeding, bruising, infection, excessive weakness, or medication side effects. A REMS form is on file and signed. Needle:  needle electrode Number of units per muscle Dysport Right  FDP 100 FDS 200 FPL 100 Pronator teres 200- instructed wife on pronator stretch  Pronator quad 200  Right lower ext VMO 200 VL 100 VI 100 RF 200  Soleus 100 units All injections were done after obtaining appropriate EMG activity and after negative drawback for blood. The patient tolerated the procedure well. Post procedure instructions were given. A followup appointment was made.

## 2022-04-02 DIAGNOSIS — N1831 Chronic kidney disease, stage 3a: Secondary | ICD-10-CM | POA: Diagnosis not present

## 2022-04-02 DIAGNOSIS — I10 Essential (primary) hypertension: Secondary | ICD-10-CM | POA: Diagnosis not present

## 2022-05-01 DIAGNOSIS — I10 Essential (primary) hypertension: Secondary | ICD-10-CM | POA: Diagnosis not present

## 2022-05-01 DIAGNOSIS — N1831 Chronic kidney disease, stage 3a: Secondary | ICD-10-CM | POA: Diagnosis not present

## 2022-05-03 ENCOUNTER — Encounter: Payer: Self-pay | Admitting: Physical Medicine & Rehabilitation

## 2022-05-03 ENCOUNTER — Encounter: Payer: 59 | Attending: Registered Nurse | Admitting: Physical Medicine & Rehabilitation

## 2022-05-03 VITALS — BP 145/99 | HR 65 | Ht 67.0 in | Wt 230.0 lb

## 2022-05-03 DIAGNOSIS — G8111 Spastic hemiplegia affecting right dominant side: Secondary | ICD-10-CM | POA: Insufficient documentation

## 2022-05-03 NOTE — Progress Notes (Signed)
Subjective:    Patient ID: Craig Knight, male    DOB: May 15, 1965, 57 y.o.   MRN: [id number] 57 y.o. male with history of hypertension, obesity, COPD with tobacco abuse diabetes mellitus diastolic congestive heart failure CKD stage III with recent admission 09/06/2020 to 09/08/2020 for right PCA territory infarction with residual mild right side weakness.  Maintained on aspirin as well as Brilinta x30 days then aspirin 81 mg daily and Plavix 75 mg daily.  He was discharged to home minimal guard.  Patient lives with spouse and daughter.  Presented [facility]/15/[facility] to [facility] nonspecific shortness of breath worsening right side weakness.  MRI of the brain showed acute left basal ganglia infarction.  Evolving subacute posterior circulation infarct without extension from 09/07/2020.  Moderate chronic small vessel ischemic changes.  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  There appeared to possibly have progressed since prior studies however difficult to compare given the amount of motion on the MRA.  CTA head neck no emergent large vessel occlusion.  Incidental findings of thyroid nodule follow-up outpatient.  Echocardiogram with ejection fraction of 60 to 65% no wall motion abnormalities.  TEE without PFO.  Admission chemistries unremarkable except BUN 33 creatinine 2.01 hemoglobin A1c 7.4.  Maintained on aspirin 81 mg daily and Brilinta 90 mg twice daily for CVA prophylaxis x30 days and back to aspirin and Plavix.  Subcutaneous Lovenox for DVT prophylaxis. Admit date: 09/19/2020 Discharge date: 10/13/2020 HPI The patient is here following Dysport injections to the right upper extremity right lower extremity.  His finger chart curling has improved in his right hand.  He does not have leg shaking when he is standing in his right lower extremity.  He is able to bend his knee more easily. He had no problems in the injection sites on the right side. The  patient's wife is currently getting chemotherapy for breast cancer and no longer can lift up his wheelchair.  He has had some decline in his mobility and his wife is also unable to help him physically at this time. Pain Inventory Average Pain 0 Pain Right Now 0 My pain is  n/a  LOCATION OF PAIN  n/a  BOWEL Number of stools per week: 14 Oral laxative use No  Type of laxative  Enema or suppository use No  History of colostomy No  Incontinent No   BLADDER Normaln/a In and out cath, frequency n/a Able to self cath No  Bladder incontinence No  Frequent urination No  Leakage with coughing No  Difficulty starting stream No  Incomplete bladder emptying No    Mobility walk with assistance ability to climb steps?  no do you drive?  no use a wheelchair Do you have any goals in this area?  yes  Function retired  Neuro/Psych trouble walking depression anxiety  Prior Studies Any changes since last visit?  no  Physicians involved in your care Follow up   Family History  Problem Relation Age of Onset   Stroke Mother    Heart attack Father 46       CABG   Hypertension Sister    Stroke Brother    Hypertension Brother    Stomach cancer Brother    Stroke Maternal Grandmother    Heart attack Maternal Grandfather    Heart attack Paternal Grandmother    Social History   Socioeconomic History   Marital status: Married    Spouse name: Not on file   Number  of children: Not on file   Years of education: Not on file   Highest education level: Not on file  Occupational History   Not on file  Tobacco Use   Smoking status: Former    Packs/day: 0.25    Types: Cigarettes   Smokeless tobacco: Never   Tobacco comments:    Quitting per patient  Vaping Use   Vaping Use: Never used  Substance and Sexual Activity   Alcohol use: Yes    Alcohol/week: 0.0 standard drinks of alcohol    Comment: occasionally   Drug use: No   Sexual activity: Yes    Birth control/protection:  None  Other Topics Concern   Not on file  Social History Narrative   Not on file   Social Determinants of Health   Financial Resource Strain: Not on file  Food Insecurity: Food Insecurity Present (08/22/2021)   Hunger Vital Sign    Worried About Running Out of Food in the Last Year: Sometimes true    Ran Out of Food in the Last Year: Never true  Transportation Needs: No Transportation Needs (08/21/2021)   PRAPARE - Hydrologist (Medical): No    Lack of Transportation (Non-Medical): No  Physical Activity: Not on file  Stress: Not on file  Social Connections: Not on file   Past Surgical History:  Procedure Laterality Date   BUBBLE STUDY  09/18/2020   Procedure: BUBBLE STUDY;  Surgeon: Pixie Casino, MD;  Location: [place];  Service: Cardiovascular;;   COLONOSCOPY N/A 09/18/2016   Procedure: COLONOSCOPY;  Surgeon: Danie Binder, MD;  [facility];  Service: Endoscopy;  Laterality: N/A;  10:30 AM   lipoma removal     TEE WITHOUT CARDIOVERSION N/A 09/18/2020   Procedure: TRANSESOPHAGEAL ECHOCARDIOGRAM (TEE);  Surgeon: Pixie Casino, MD;  Location: [facility] ENDOSCOPY;  Service: Cardiovascular;  Laterality: N/A;   Past Medical History:  Diagnosis Date   Asthma    Bronchitis    Cellulitis    COPD (chronic obstructive pulmonary disease) ([place])    Diabetes mellitus without complication ([place])    Heart murmur [id number]   soft systolic murmur 1/6   Hypertension    Sleep apnea    Stroke (HCC)    BP (!) 145/99   Pulse 65   Ht '5\' 7"'$  (1.702 m)   Wt 230 lb (104.3 kg) Comment: per patient  SpO2 94%   BMI 36.02 kg/m   Opioid Risk Score:   Fall Risk Score:  `1  Depression screen PHQ 2/9     05/03/2022   12:54 PM 01/31/2022    1:30 PM 12/18/2021    2:04 PM 10/25/2021    1:00 PM 09/13/2021    1:14 PM 08/21/2021    3:21 PM 08/17/2021   10:34 AM  Depression screen PHQ 2/9  Decreased Interest 1 0 0 0 0 0 0  Down, Depressed, Hopeless 1 0 0 0  0 0 0  PHQ - 2 Score 2 0 0 0 0 0 0     Review of Systems  Constitutional:  Positive for appetite change and unexpected weight change.       Night sweats       Objective:   Physical Exam Vitals and nursing note reviewed.  Constitutional:      Appearance: He is obese.  HENT:     Head: Normocephalic and atraumatic.  Eyes:     Extraocular Movements: Extraocular movements intact.  Conjunctiva/sclera: Conjunctivae normal.     Pupils: Pupils are equal, round, and reactive to light.  Musculoskeletal:        General: No swelling or tenderness.     Right lower leg: No edema.     Left lower leg: No edema.  Skin:    General: Skin is warm and dry.  Neurological:     Mental Status: He is alert and oriented to person, place, and time.     Gait: Gait abnormal.     Comments: Motor strength is 0/5 in the right upper limb and . 3 - right knee extensor, trace right hip flexor, 2 minus right hip adductor.  Left side has normal strength. Tone MAS 1 at the right elbow flexor 1 at the finger flexors and wrist flexors 1 at the thumb flexor MAS 0 at the hamstrings MAS 1 at the knee extensors 2 at the ankle plantar flexors. No evidence of clonus at the ankle he has 3+ deep tendon reflex at the right knee.  Psychiatric:        Mood and Affect: Mood normal.        Behavior: Behavior normal.        Thought Content: Thought content normal.        Judgment: Judgment normal.           Assessment & Plan:   1.  Right spastic hemiplegia improved after Dysport injection.  Would recommend reinjecting in 6 weeks with the same muscle group selection as well as dosing. 2.  Decline in mobility combined with patient's wife physical capacity due to her own medical issues with breast cancer and chemotherapy. Will order home health PT to assess the patient.

## 2022-06-24 DIAGNOSIS — N1831 Chronic kidney disease, stage 3a: Secondary | ICD-10-CM | POA: Diagnosis not present

## 2022-06-24 DIAGNOSIS — E118 Type 2 diabetes mellitus with unspecified complications: Secondary | ICD-10-CM | POA: Diagnosis not present

## 2022-06-24 DIAGNOSIS — Z993 Dependence on wheelchair: Secondary | ICD-10-CM | POA: Diagnosis not present

## 2022-06-24 DIAGNOSIS — I2510 Atherosclerotic heart disease of native coronary artery without angina pectoris: Secondary | ICD-10-CM | POA: Diagnosis not present

## 2022-06-24 DIAGNOSIS — G8191 Hemiplegia, unspecified affecting right dominant side: Secondary | ICD-10-CM | POA: Diagnosis not present

## 2022-06-24 DIAGNOSIS — I10 Essential (primary) hypertension: Secondary | ICD-10-CM | POA: Diagnosis not present

## 2022-06-24 DIAGNOSIS — J449 Chronic obstructive pulmonary disease, unspecified: Secondary | ICD-10-CM | POA: Diagnosis not present

## 2022-06-24 DIAGNOSIS — Z0001 Encounter for general adult medical examination with abnormal findings: Secondary | ICD-10-CM | POA: Diagnosis not present

## 2022-06-24 DIAGNOSIS — Z1389 Encounter for screening for other disorder: Secondary | ICD-10-CM | POA: Diagnosis not present

## 2022-06-24 DIAGNOSIS — I5032 Chronic diastolic (congestive) heart failure: Secondary | ICD-10-CM | POA: Diagnosis not present

## 2022-06-25 ENCOUNTER — Encounter: Payer: Self-pay | Admitting: Physical Medicine & Rehabilitation

## 2022-06-25 ENCOUNTER — Encounter: Payer: 59 | Attending: Registered Nurse | Admitting: Physical Medicine & Rehabilitation

## 2022-06-25 VITALS — BP 141/86 | HR 62 | Ht 67.0 in

## 2022-06-25 DIAGNOSIS — G8111 Spastic hemiplegia affecting right dominant side: Secondary | ICD-10-CM | POA: Diagnosis not present

## 2022-06-25 MED ORDER — ABOBOTULINUMTOXINA 500 UNITS IM SOLR
1500.0000 [IU] | Freq: Once | INTRAMUSCULAR | Status: AC
  Administered 2022-06-25: 1500 [IU] via INTRAMUSCULAR

## 2022-06-25 MED ORDER — SODIUM CHLORIDE (PF) 0.9 % IJ SOLN
7.5000 mL | Freq: Once | INTRAMUSCULAR | Status: AC
  Administered 2022-06-25: 7.5 mL via INTRAVENOUS

## 2022-06-25 NOTE — Patient Instructions (Signed)

## 2022-06-25 NOTE — Progress Notes (Signed)
Dysport Injection for spasticity using needle EMG guidance  Dilution: 200 Units/ml Indication: Severe spasticity which interferes with ADL,mobility and/or  hygiene and is unresponsive to medication management and other conservative care Informed consent was obtained after describing risks and benefits of the procedure with the patient. This includes bleeding, bruising, infection, excessive weakness, or medication side effects. A REMS form is on file and signed. Needle:  needle electrode Number of units per muscle Dysport Right  FDP 100 FDS 200 FPL 100 Pronator teres 100- instructed wife on pronator stretch  Pronator quad 200  Right lower ext VMO 200 VL 100 VI 100 RF 200  Soleus 200 units All injections were done after obtaining appropriate EMG activity and after negative drawback for blood. The patient tolerated the procedure well. Post procedure instructions were given. A followup appointment was made.

## 2022-07-23 ENCOUNTER — Encounter (HOSPITAL_COMMUNITY): Payer: Self-pay | Admitting: Occupational Therapy

## 2022-07-23 NOTE — Therapy (Signed)
[facility] Outpatient Rehabilitation at [facility] [address] [place], [place], [address] Phone: [phone]   Fax:  [phone]  Patient Details  Name: CHRISHAUN STOEN MRN: [id number] Date of Birth: 29-Oct-1965 Referring Provider:  No ref. provider found  Encounter Date: 07/23/2022   OCCUPATIONAL THERAPY DISCHARGE SUMMARY  Visits from Start of Care: 5  Current functional level related to goals / functional outcomes: Unknown. Pt's last visit for this episode of care was 05/08/21. Discharging and resolving episode as pt did not return for additional visits, and now has a new referral and will be returning for a new evaluation.    Remaining deficits: At last visit on 05/08/21: reassessment completed this date. patient has met 1 out of 2 therapy goals. Partially met 2nd goal. Pt reports that he feels like his arm is moving better. Reviewed the focus on OT during this plan of care is not to work on achieving A/ROM but to work on increase his passive ROM in order to increase the ability to complete bathing, dressing, etc. with his wife. Patient has increase his RUE P/ROM; where needed; by 10 degrees with his shoulder, elbow, or wrist. He did not achieve a 10 degree increase with shoulder external rotation or elbow extension. Recommended continuing skilled OT services twice a week until the end of the month focusing on these movements specifically.    Education / Equipment: HEP   Patient agrees to discharge. Patient goals were not met. Patient is being discharged due to not returning since the last visit.Marland Kitchen     Ezra Sites, OTR/L  [phone] 07/23/2022, 10:06 AM  [facility] Outpatient Rehabilitation at [facility] [address] [place], [place], [address] Phone: [phone]   Fax:  [phone]

## 2022-07-24 ENCOUNTER — Other Ambulatory Visit: Payer: Self-pay

## 2022-07-24 ENCOUNTER — Ambulatory Visit (HOSPITAL_COMMUNITY): Payer: 59 | Attending: Physical Medicine & Rehabilitation | Admitting: Occupational Therapy

## 2022-07-24 ENCOUNTER — Encounter (HOSPITAL_COMMUNITY): Payer: Self-pay | Admitting: Occupational Therapy

## 2022-07-24 ENCOUNTER — Ambulatory Visit (HOSPITAL_COMMUNITY): Payer: 59

## 2022-07-24 DIAGNOSIS — R262 Difficulty in walking, not elsewhere classified: Secondary | ICD-10-CM | POA: Insufficient documentation

## 2022-07-24 DIAGNOSIS — R2689 Other abnormalities of gait and mobility: Secondary | ICD-10-CM

## 2022-07-24 DIAGNOSIS — N1831 Chronic kidney disease, stage 3a: Secondary | ICD-10-CM | POA: Diagnosis not present

## 2022-07-24 DIAGNOSIS — R278 Other lack of coordination: Secondary | ICD-10-CM | POA: Insufficient documentation

## 2022-07-24 DIAGNOSIS — R29818 Other symptoms and signs involving the nervous system: Secondary | ICD-10-CM | POA: Diagnosis not present

## 2022-07-24 DIAGNOSIS — G8111 Spastic hemiplegia affecting right dominant side: Secondary | ICD-10-CM | POA: Diagnosis not present

## 2022-07-24 DIAGNOSIS — I69351 Hemiplegia and hemiparesis following cerebral infarction affecting right dominant side: Secondary | ICD-10-CM | POA: Insufficient documentation

## 2022-07-24 DIAGNOSIS — I69951 Hemiplegia and hemiparesis following unspecified cerebrovascular disease affecting right dominant side: Secondary | ICD-10-CM | POA: Insufficient documentation

## 2022-07-24 DIAGNOSIS — I10 Essential (primary) hypertension: Secondary | ICD-10-CM | POA: Diagnosis not present

## 2022-07-24 NOTE — Therapy (Signed)
OUTPATIENT OCCUPATIONAL THERAPY NEURO EVALUATION  Patient Name: Craig Knight MRN: [id number] DOB:04-30-1965, 57 y.o., male Today's Date: 07/24/2022  END OF SESSION:  OT End of Session - 07/24/22 1422     Visit Number 1    Number of Visits 1    Date for OT Re-Evaluation 07/25/22    Authorization Type UHC Medicare    Authorization Time Period copay $15, no visit limit, authorization needed.    OT Start Time 1346    OT Stop Time 1415    OT Time Calculation (min) 29 min    Activity Tolerance Patient tolerated treatment well    Behavior During Therapy WFL for tasks assessed/performed             Past Medical History:  Diagnosis Date   Asthma    Bronchitis    Cellulitis    COPD (chronic obstructive pulmonary disease) (HCC)    Diabetes mellitus without complication (HCC)    Heart murmur 08/24/2020   soft systolic murmur 1/6   Hypertension    Sleep apnea    Stroke [facility])    Past Surgical History:  Procedure Laterality Date   BUBBLE STUDY  09/18/2020   Procedure: BUBBLE STUDY;  Surgeon: Chrystie Nose, MD;  Location: [facility] ENDOSCOPY;  Service: Cardiovascular;;   COLONOSCOPY N/A 09/18/2016   Procedure: COLONOSCOPY;  Surgeon: West Bali, MD;  Location: [facility] ENDO SUITE;  Service: Endoscopy;  Laterality: N/A;  10:30 AM   lipoma removal     TEE WITHOUT CARDIOVERSION N/A 09/18/2020   Procedure: TRANSESOPHAGEAL ECHOCARDIOGRAM (TEE);  Surgeon: Chrystie Nose, MD;  Location: [facility] ENDOSCOPY;  Service: Cardiovascular;  Laterality: N/A;   Patient Active Problem List   Diagnosis Date Noted   Atrial fibrillation, unspecified type (HCC) 10/25/2021   Morbid obesity due to excess calories (HCC) 10/25/2021   Bilateral lower extremity edema 08/13/2021   AKI (acute kidney injury) (HCC) 08/12/2021   Cellulitis of right lower extremity 08/12/2021   Prurigo nodularis    Dysphagia, post-stroke    Essential hypertension    Diabetic peripheral neuropathy (HCC)    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   Acute CVA (cerebrovascular accident) (HCC) 09/06/2020   Stroke (HCC) 11/23/2018   Numbness 11/23/2018   CVA (cerebral vascular accident) (HCC) 11/23/2018   Lung nodule 11/17/2017   Chest pressure 11/22/2016   Shortness of breath 11/22/2016   Depression, major, single episode, moderate (HCC) 10/14/2016   Special screening for malignant neoplasms, colon    Chronic obstructive pulmonary disease (HCC) 06/08/2016   Hypertensive emergency 02/16/2016   Hypertensive urgency 06/07/2015   CKD (chronic kidney disease), stage III (HCC) 06/07/2015   Pain in the chest    Elevated troponin    Chronic diastolic CHF (congestive heart failure) (HCC) 09/17/2013   DM type 2 (diabetes mellitus, type 2) (HCC) 09/15/2013   Chest pain 08/01/2011   HTN (hypertension) 08/01/2011   Chest pain 07/22/2011   Renal insufficiency 07/22/2011   Tobacco abuse 07/22/2011   Hypertension     PCP: Dr. Avon Gully REFERRING PROVIDER: Dr. Claudette Laws  ONSET DATE: 08/2020  REFERRING DIAG: right spastic hemiplegia  THERAPY DIAG:  Other symptoms and signs involving the nervous system  Other lack of coordination  Rationale for Evaluation and Treatment: Rehabilitation  SUBJECTIVE:   SUBJECTIVE STATEMENT: S: For me to move. (When asked goal of botox) Pt accompanied by: self and significant other ([name])  PERTINENT HISTORY: Pt is a 57 y/o male s/p left CVA  on 09/06/20 evolved on 09/11/20. Pt received CIR services from 7/26-8/19, and received OP OT services at this clinic from 10/25/20-11/2020 then from 04/12/21-05/08/21. Pt presents today reporting he wants to use his right side more. At last visit on 05/09/22 pt and OT were working on passive stretching to limit tightness during ADLs, pt did not have any functional movement in the RUE. Pt recently began botox in January 2024.  PRECAUTIONS: Fall  WEIGHT BEARING RESTRICTIONS: No  PAIN:  Are you having pain? No  FALLS: Has patient  fallen in last 6 months? No  LIVING ENVIRONMENT: Lives with: lives with their spouse, lives with their son, and lives with their daughter Lives in: House/apartment Stairs:  has a ramp Has following equipment at home: Single point cane, Hemi walker, Wheelchair (manual), shower chair, bed side commode, Grab bars, and Ramped entry  PLOF: Needs assistance with ADLs, Needs assistance with homemaking, Needs assistance with gait, and Needs assistance with transfers  PATIENT GOALS: To move the right arm.   OBJECTIVE:   HAND DOMINANCE: Right  ADLs: Overall ADLs: Pt  unable to use RUE for any ADLs, wife, daughter, or son assist with most ADLs and transfers.  Transfers/ambulation related to ADLs: Eating: Pt using left hand to feed himself Grooming: Using left hand for tasks, has assistance for face washing, daughter shaves UB Dressing: Pt has assist-50/50 LB Dressing: Pt has assist for RLE-50/50 Toileting: Pt supervision/contact guard for transfers to commode, assist as needed Bathing: Pt sponge bathes, assist with left side Tub Shower transfers: does not complete  MOBILITY STATUS: Needs Assist: contact guard  UPPER EXTREMITY ROM:    Passive ROM Right eval  Shoulder flexion 128  Shoulder abduction 122  Shoulder internal rotation 90  Shoulder external rotation 21  Elbow flexion 135  Elbow extension -8  Wrist flexion 65  Wrist extension 60  Wrist ulnar deviation 37  Wrist radial deviation 35  Wrist pronation 90  Wrist supination 50  (Blank rows = not tested)  UPPER EXTREMITY MMT:     Pt with no active movement of RUE.   MMT Right eval  Shoulder flexion 0  Shoulder abduction 0  Shoulder adduction 0  Shoulder extension 0  Shoulder internal rotation 0  Shoulder external rotation 0  Middle trapezius 0  Lower trapezius 0  Elbow flexion 0  Elbow extension 0  Wrist flexion 0  Wrist extension 0  Wrist ulnar deviation 0  Wrist radial deviation 0  Wrist pronation 0  Wrist  supination 0  (Blank rows = not tested)  HAND FUNCTION: Unable to test-no active movement  COORDINATION: Unable to test-no active movement  SENSATION: WFL  MUSCLE TONE: RUE: Mild  COGNITION: Overall cognitive status: Within functional limits for tasks assessed   TODAY'S TREATMENT:  DATE: N/A-eval only    PATIENT EDUCATION: Education details: self-ROM Person educated: Patient and Spouse Education method: Explanation, Facilities manager, and Handouts Education comprehension: verbalized understanding and returned demonstration  HOME EXERCISE PROGRAM: Eval-self-ROM passive stretching   GOALS: Goals reviewed with patient? Yes  SHORT TERM GOALS: Target date: 07/24/22  Pt will be provided with and educated on HEP to improve mobility of RUE required for passively mobilizing the RUE during ADLs.   Goal status: MET  ASSESSMENT:  CLINICAL IMPRESSION: Patient is a 57 y.o. male who was seen today for occupational therapy evaluation s/p CVA. Pt well known to clinician from prior rehab episodes in 2022 and 2023. Pt presents with improved passive ROM in his RUE, however does not have any active movement in the RUE. Discussed stroke progression and recovery with pt and wife, with goal being maintaining P/ROM required for mobilizing the RUE during ADL tasks. Pt has been receiving botox since January 2024 and has notable improvement in passive mobility of RUE, no pain with P/ROM. Pt requiring assist from family at baseline, no further OT needs at this time.   PERFORMANCE DEFICITS: in functional skills including ADLs, IADLs, coordination, dexterity, proprioception, tone, ROM, strength, and UE functional use  IMPAIRMENTS: are limiting patient from ADLs, IADLs, and leisure.   CO-MORBIDITIES: has no other co-morbidities that affects occupational performance. Patient will  benefit from skilled OT to address above impairments and improve overall function.  MODIFICATION OR ASSISTANCE TO COMPLETE EVALUATION: No modification of tasks or assist necessary to complete an evaluation.  OT OCCUPATIONAL PROFILE AND HISTORY: Problem focused assessment: Including review of records relating to presenting problem.  CLINICAL DECISION MAKING: LOW - limited treatment options, no task modification necessary  REHAB POTENTIAL: Poor almost 2 years post stroke  EVALUATION COMPLEXITY: Low    PLAN:  OT FREQUENCY: one time visit  OT DURATION: 1 week  PLANNED INTERVENTIONS: patient/family education  RECOMMENDED OTHER SERVICES: None at this time  CONSULTED AND AGREED WITH PLAN OF CARE: Patient and family member/caregiver  PLAN FOR NEXT SESSION: N/A-pt provided with HEP for maintaining passive mobility    Ezra Sites, OTR/L  [phone] 07/24/2022, 2:22 PM

## 2022-07-24 NOTE — Therapy (Signed)
OUTPATIENT PHYSICAL THERAPY NEURO EVALUATION   Patient Name: Craig Knight MRN: [id number] DOB:02/27/1965, 57 y.o., male Today's Date: 07/24/2022   PCP: Dr. Avon Gully, MD REFERRING PROVIDER: Erick Colace, MD  END OF SESSION:  PT End of Session - 07/24/22 1301     Visit Number 1    Number of Visits 8    Date for PT Re-Evaluation 08/21/22    Authorization Type UHC Medicare    Progress Note Due on Visit 10    PT Start Time 1300    PT Stop Time 1340    PT Time Calculation (min) 40 min    Activity Tolerance Patient limited by fatigue;Patient tolerated treatment well    Behavior During Therapy [facility] for tasks assessed/performed             Past Medical History:  Diagnosis Date   Asthma    Bronchitis    Cellulitis    COPD (chronic obstructive pulmonary disease) (HCC)    Diabetes mellitus without complication (HCC)    Heart murmur 08/24/2020   soft systolic murmur 1/6   Hypertension    Sleep apnea    Stroke [facility])    Past Surgical History:  Procedure Laterality Date   BUBBLE STUDY  09/18/2020   Procedure: BUBBLE STUDY;  Surgeon: Chrystie Nose, MD;  Location: [facility] ENDOSCOPY;  Service: Cardiovascular;;   COLONOSCOPY N/A 09/18/2016   Procedure: COLONOSCOPY;  Surgeon: West Bali, MD;  Location: [facility] ENDO SUITE;  Service: Endoscopy;  Laterality: N/A;  10:30 AM   lipoma removal     TEE WITHOUT CARDIOVERSION N/A 09/18/2020   Procedure: TRANSESOPHAGEAL ECHOCARDIOGRAM (TEE);  Surgeon: Chrystie Nose, MD;  Location: [facility] ENDOSCOPY;  Service: Cardiovascular;  Laterality: N/A;   Patient Active Problem List   Diagnosis Date Noted   Atrial fibrillation, unspecified type (HCC) 10/25/2021   Morbid obesity due to excess calories (HCC) 10/25/2021   Bilateral lower extremity edema 08/13/2021   AKI (acute kidney injury) (HCC) 08/12/2021   Cellulitis of right lower extremity 08/12/2021   Prurigo nodularis    Dysphagia, post-stroke    Essential hypertension    Diabetic  peripheral neuropathy (HCC)    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   Acute CVA (cerebrovascular accident) (HCC) 09/06/2020   Stroke (HCC) 11/23/2018   Numbness 11/23/2018   CVA (cerebral vascular accident) (HCC) 11/23/2018   Lung nodule 11/17/2017   Chest pressure 11/22/2016   Shortness of breath 11/22/2016   Depression, major, single episode, moderate (HCC) 10/14/2016   Special screening for malignant neoplasms, colon    Chronic obstructive pulmonary disease (HCC) 06/08/2016   Hypertensive emergency 02/16/2016   Hypertensive urgency 06/07/2015   CKD (chronic kidney disease), stage III (HCC) 06/07/2015   Pain in the chest    Elevated troponin    Chronic diastolic CHF (congestive heart failure) (HCC) 09/17/2013   DM type 2 (diabetes mellitus, type 2) (HCC) 09/15/2013   Chest pain 08/01/2011   HTN (hypertension) 08/01/2011   Chest pain 07/22/2011   Renal insufficiency 07/22/2011   Tobacco abuse 07/22/2011   Hypertension     ONSET DATE: 09/15/20  REFERRING DIAG: G81.11 (ICD-10-CM) - Right spastic hemiplegia (HCC)  THERAPY DIAG:  Spastic hemiplegia of right dominant side as late effect of cerebrovascular disease, unspecified cerebrovascular disease type (HCC)  Difficulty in walking, not elsewhere classified  Hemiplegia and hemiparesis following cerebral infarction affecting right dominant side (HCC)  Other abnormalities of gait and mobility  Other lack of coordination  Rationale for Evaluation and Treatment: Rehabilitation  SUBJECTIVE:                                                                                                                                                                                             SUBJECTIVE STATEMENT: Patient presents to therapy with complaint of Rt sided weakness s/p CVA 09/15/20. Had some inpatient therapy and then completed outpatient therapy here last year from end of August to March 2023. Told his MD he wanted to  come back as he wants to continue to improve.  Is wanting to progress strengthening with outpatient therapy. States he is non-ambulatory, wife assists with all ADLs at home. His goals is to walk and move RT side. Getting Botox injections currently in right leg and right arm; has had 3 sets of injections Pt accompanied by:  wife Misty Stanley  PERTINENT HISTORY: outpatient therapy from 09/2020 to 04/2021  PAIN:  Are you having pain? Yes: NPRS scale: 3-4/10 Pain location: right arm Pain description: gets tired Aggravating factors: if hangs down for a while  Relieving factors: move it with left arm  PRECAUTIONS: Fall  WEIGHT BEARING RESTRICTIONS: No  FALLS: Has patient fallen in last 6 months? No  LIVING ENVIRONMENT: Lives with: lives with their spouse, lives with their son, and lives with their daughter Lives in: House/apartment Stairs: No Has following equipment at home: Hemi walker, Wheelchair (manual), shower chair, bed side commode, and Ramped entry  PLOF: Needs assistance with ADLs and Needs assistance with transfers  PATIENT GOALS: get my right side moving  OBJECTIVE:   DIAGNOSTIC FINDINGS:   CLINICAL DATA:  57 year old male with history of leg swelling and pain.   EXAM: RIGHT TIBIA AND FIBULA - 2 VIEW   COMPARISON:  No priors.   FINDINGS: There is no evidence of fracture or other focal bone lesions. Soft tissues are diffusely swollen.   IMPRESSION: 1. Diffuse soft tissue swelling without acute osseous abnormality.    COGNITION: Overall cognitive status: Within functional limits for tasks assessed   SENSATION: Reports occasionally numbness and tingling  COORDINATION: minimal use of right side  EDEMA:  No significant edemea noted  MUSCLE TONE: RLE: Flaccid   POSTURE: rounded shoulders, forward head, flexed trunk , and weight shift left  LOWER EXTREMITY ROM:     Active  Right Eval Left Eval  Hip flexion    Hip extension    Hip abduction    Hip adduction     Hip internal rotation    Hip external rotation    Knee flexion    Knee extension    Ankle dorsiflexion  Ankle plantarflexion    Ankle inversion    Ankle eversion     (Blank rows = not tested)  LOWER EXTREMITY MMT:    MMT Right Eval Left Eval  Hip flexion Unable to actively flex in sitting   Hip extension    Hip abduction    Hip adduction    Hip internal rotation    Hip external rotation    Knee flexion    Knee extension 3-   Ankle dorsiflexion Unable to actively flex in sitting   Ankle plantarflexion    Ankle inversion    Ankle eversion    (Blank rows = not tested)  BED MOBILITY:  Not tested  TRANSFERS: Assistive device utilized: Hemi walker  Sit to stand: Mod A Stand to sit: Min A Chair to chair:  not tested Floor:  not tested    GAIT: Gait pattern: decreased arm swing- Right, decreased step length- Right, decreased stance time- Right, circumduction- Right, Right hip hike, and poor foot clearance- Right Distance walked: 1 ft Assistive device utilized: Hemi walker Level of assistance: Min A Comments: forward flexed trunk  FUNCTIONAL TESTS:  30 seconds chair stand test x 0; needs mod A for sit to stand  PATIENT SURVEYS:  LEFS 16/80  TODAY'S TREATMENT:                                                                                                                              DATE: 07/24/2022 physical therapy evaluation and HEP instruction    PATIENT EDUCATION: Education details: Patient educated on exam findings, POC, scope of PT, HEP, and what to expect next visit. Person educated: Patient Education method: Explanation, Demonstration, and Handouts Education comprehension: verbalized understanding, returned demonstration, verbal cues required, and tactile cues required   HOME EXERCISE PROGRAM: Access Code: [email or web address] URL: [email or web address] Date: 07/24/2022 Prepared by: [facility] - Rehab  Exercises - Sit to Stand with Counter Support   - 2 x daily - 7 x weekly - 1 sets - 5 reps  GOALS: Goals reviewed with patient? No  SHORT TERM GOALS: Target date: 08/07/2022  patient will be independent with initial HEP Baseline: Goal status: INITIAL  2.  Patient will self report 30% improvement to improve tolerance for functional activity  Baseline:  Goal status: INITIAL   LONG TERM GOALS: Target date: 08/21/2022  Patient will be independent in self management strategies to improve quality of life and functional outcomes.  Baseline:  Goal status: INITIAL  2.  Patient will self report 50% improvement to improve tolerance for functional activity  Baseline:  Goal status: INITIAL  3.  Patient will perform sit to stand from [facility] modified independently to hemiwalker to increase independence at home Baseline: sit to stand from [facility] with mod A Goal status: INITIAL  4.  Patient will ambulate x 50 ft with hemiwalker and SBA to increase independence with household ambulation Baseline:  Goal status: INITIAL  5.  Patient will increase LEFS  score by 5 points to demonstrate improved perceived functional mobiity Baseline: 16/80 Goal status: INITIAL  ASSESSMENT:  CLINICAL IMPRESSION: Patient is a 57 y.o. male who was seen today for physical therapy evaluation and treatment for right side weakness/hemiplegia.  Patient demonstrates decreased strength, balance deficits and gait abnormalities which are negatively impacting patient ability to perform ADLs and functional mobility tasks. Patient will benefit from skilled physical therapy services to address these deficits to improve level of function with ADLs, functional mobility tasks, and reduce risk for falls.    OBJECTIVE IMPAIRMENTS: Abnormal gait, decreased activity tolerance, decreased balance, decreased coordination, decreased endurance, decreased mobility, difficulty walking, decreased ROM, decreased strength, decreased safety awareness, hypomobility, impaired perceived functional  ability, increased muscle spasms, impaired flexibility, and obesity.   ACTIVITY LIMITATIONS: carrying, lifting, bending, sitting, standing, squatting, stairs, transfers, bed mobility, bathing, toileting, dressing, self feeding, reach over head, hygiene/grooming, locomotion level, and caring for others  PARTICIPATION LIMITATIONS: meal prep, cleaning, laundry, driving, shopping, community activity, and yard work  [facility] POTENTIAL: Good  CLINICAL DECISION MAKING: Evolving/moderate complexity  EVALUATION COMPLEXITY: Moderate  PLAN:  PT FREQUENCY: 2x/week  PT DURATION: 4 weeks  PLANNED INTERVENTIONS: Therapeutic exercises, Therapeutic activity, Neuromuscular re-education, Balance training, Gait training, Patient/Family education, Joint manipulation, Joint mobilization, Stair training, Orthotic/Fit training, DME instructions, Aquatic Therapy, Dry Needling, Electrical stimulation, Spinal manipulation, Spinal mobilization, Cryotherapy, Moist heat, Compression bandaging, scar mobilization, Splintting, Taping, Traction, Ultrasound, Ionotophoresis 4mg /ml Dexamethasone, and Manual therapy   PLAN FOR NEXT SESSION: Review HEP and goals; check bed mobility and right leg MMTs; seeing OT also; sit to stand transfer, transfers, gait training as able.     3:00 PM, 07/24/22 Tyronica [place] Small Lorenia Hoston MPT [facility] [place] [phone]

## 2022-07-29 ENCOUNTER — Ambulatory Visit (HOSPITAL_COMMUNITY): Payer: 59 | Attending: Physical Medicine & Rehabilitation | Admitting: Physical Therapy

## 2022-07-29 DIAGNOSIS — R2689 Other abnormalities of gait and mobility: Secondary | ICD-10-CM | POA: Diagnosis not present

## 2022-07-29 DIAGNOSIS — I69351 Hemiplegia and hemiparesis following cerebral infarction affecting right dominant side: Secondary | ICD-10-CM | POA: Diagnosis not present

## 2022-07-29 DIAGNOSIS — R278 Other lack of coordination: Secondary | ICD-10-CM | POA: Diagnosis not present

## 2022-07-29 DIAGNOSIS — R262 Difficulty in walking, not elsewhere classified: Secondary | ICD-10-CM | POA: Insufficient documentation

## 2022-07-29 DIAGNOSIS — I69951 Hemiplegia and hemiparesis following unspecified cerebrovascular disease affecting right dominant side: Secondary | ICD-10-CM | POA: Insufficient documentation

## 2022-07-29 NOTE — Therapy (Signed)
OUTPATIENT PHYSICAL THERAPY TREATMENT  Patient Name: Craig Knight MRN: [id number] DOB:[birth year], 57 y.o., male Today's Date: 07/29/2022   PCP: Dr. Avon Gully, MD REFERRING PROVIDER: Erick Colace, MD  END OF SESSION:  PT End of Session - 07/29/22 1618     Visit Number 2    Number of Visits 8    Date for PT Re-Evaluation 08/21/22    Authorization Type [employer] Medicare    Progress Note Due on Visit 10    PT Start Time 1606    PT Stop Time 1648    PT Time Calculation (min) 42 min    Activity Tolerance Patient limited by fatigue;Patient tolerated treatment well    Behavior During Therapy [facility] for tasks assessed/performed             Past Medical History:  Diagnosis Date   Asthma    Bronchitis    Cellulitis    COPD (chronic obstructive pulmonary disease) (HCC)    Diabetes mellitus without complication (HCC)    Heart murmur 08/24/2020   soft systolic murmur 1/6   Hypertension    Sleep apnea    Stroke [facility])    Past Surgical History:  Procedure Laterality Date   BUBBLE STUDY  09/18/2020   Procedure: BUBBLE STUDY;  Surgeon: Chrystie Nose, MD;  Location: [facility] ENDOSCOPY;  Service: Cardiovascular;;   COLONOSCOPY N/A 09/18/2016   Procedure: COLONOSCOPY;  Surgeon: West Bali, MD;  Location: [facility] ENDO SUITE;  Service: Endoscopy;  Laterality: N/A;  10:30 AM   lipoma removal     TEE WITHOUT CARDIOVERSION N/A 09/18/2020   Procedure: TRANSESOPHAGEAL ECHOCARDIOGRAM (TEE);  Surgeon: Chrystie Nose, MD;  Location: [facility] ENDOSCOPY;  Service: Cardiovascular;  Laterality: N/A;   Patient Active Problem List   Diagnosis Date Noted   Atrial fibrillation, unspecified type (HCC) 10/25/2021   Morbid obesity due to excess calories (HCC) 10/25/2021   Bilateral lower extremity edema 08/13/2021   AKI (acute kidney injury) (HCC) 08/12/2021   Cellulitis of right lower extremity 08/12/2021   Prurigo nodularis    Dysphagia, post-stroke    Essential hypertension    Diabetic peripheral  neuropathy (HCC)    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   Acute CVA (cerebrovascular accident) (HCC) 09/06/2020   Stroke (HCC) 11/23/2018   Numbness 11/23/2018   CVA (cerebral vascular accident) (HCC) 11/23/2018   Lung nodule 11/17/2017   Chest pressure 11/22/2016   Shortness of breath 11/22/2016   Depression, major, single episode, moderate (HCC) 10/14/2016   Special screening for malignant neoplasms, colon    Chronic obstructive pulmonary disease (HCC) 06/08/2016   Hypertensive emergency 02/16/2016   Hypertensive urgency 06/07/2015   CKD (chronic kidney disease), stage III (HCC) 06/07/2015   Pain in the chest    Elevated troponin    Chronic diastolic CHF (congestive heart failure) (HCC) 09/17/2013   DM type 2 (diabetes mellitus, type 2) (HCC) 09/15/2013   Chest pain 08/01/2011   HTN (hypertension) 08/01/2011   Chest pain 07/22/2011   Renal insufficiency 07/22/2011   Tobacco abuse 07/22/2011   Hypertension     ONSET DATE: 09/15/20  REFERRING DIAG: G81.11 (ICD-10-CM) - Right spastic hemiplegia (HCC)  THERAPY DIAG:  Spastic hemiplegia of right dominant side as late effect of cerebrovascular disease, unspecified cerebrovascular disease type (HCC)  Difficulty in walking, not elsewhere classified  Hemiplegia and hemiparesis following cerebral infarction affecting right dominant side (HCC)  Other abnormalities of gait and mobility  Other lack of coordination  Rationale for  Evaluation and Treatment: Rehabilitation  SUBJECTIVE:                                                                                                                                                                                             SUBJECTIVE STATEMENT: Pt returns today in wheelchair.  Wife and daughter with him today.  States he has most difficulty getting out of chair.  States he sleeps in recliner.  Mostly transports using his wheelchair inside home  Evaluation: Patient presents  to therapy with complaint of Rt sided weakness s/p CVA 09/15/20. Had some inpatient therapy and then completed outpatient therapy here last year from end of August to March 2023. Told his MD he wanted to come back as he wants to continue to improve.  Is wanting to progress strengthening with outpatient therapy. States he is non-ambulatory, wife assists with all ADLs at home. His goals is to walk and move RT side. Getting Botox injections currently in right leg and right arm; has had 3 sets of injections Pt accompanied by:  wife Misty Stanley  PERTINENT HISTORY: outpatient therapy from 09/2020 to 04/2021  PAIN:  Are you having pain? Yes: NPRS scale: 3-4/10 Pain location: right arm Pain description: gets tired Aggravating factors: if hangs down for a while  Relieving factors: move it with left arm  PRECAUTIONS: Fall  WEIGHT BEARING RESTRICTIONS: No  FALLS: Has patient fallen in last 6 months? No  LIVING ENVIRONMENT: Lives with: lives with their spouse, lives with their son, and lives with their daughter Lives in: House/apartment Stairs: No Has following equipment at home: Hemi walker, Wheelchair (manual), shower chair, bed side commode, and Ramped entry  PLOF: Needs assistance with ADLs and Needs assistance with transfers  PATIENT GOALS: get my right side moving  OBJECTIVE:   DIAGNOSTIC FINDINGS:   CLINICAL DATA:  58 year old male with history of leg swelling and pain.   EXAM: RIGHT TIBIA AND FIBULA - 2 VIEW   COMPARISON:  No priors.   FINDINGS: There is no evidence of fracture or other focal bone lesions. Soft tissues are diffusely swollen.   IMPRESSION: 1. Diffuse soft tissue swelling without acute osseous abnormality.    COGNITION: Overall cognitive status: Within functional limits for tasks assessed   SENSATION: Reports occasionally numbness and tingling  COORDINATION: minimal use of right side  EDEMA:  No significant edemea noted  MUSCLE TONE: RLE:  Flaccid   POSTURE: rounded shoulders, forward head, flexed trunk , and weight shift left  LOWER EXTREMITY ROM:     Active  Right Eval Left Eval  Hip flexion    Hip extension  Hip abduction    Hip adduction    Hip internal rotation    Hip external rotation    Knee flexion    Knee extension    Ankle dorsiflexion    Ankle plantarflexion    Ankle inversion    Ankle eversion     (Blank rows = not tested)  LOWER EXTREMITY MMT:    MMT Right Eval Right 07/29/22 Left 07/29/22  Hip flexion Unable to actively flex in sitting  2- 3  Hip extension  2- Unable to test appropriately 3 Unable to test appropriately  Hip abduction  2- 4+  Hip adduction  2- 4+  Hip internal rotation     Hip external rotation     Knee flexion  1 4+  Knee extension 3- 3- 4+  Ankle dorsiflexion Unable to actively flex in sitting 1 4+  Ankle plantarflexion     Ankle inversion     Ankle eversion     (Blank rows = not tested)  BED MOBILITY:  Not tested  TRANSFERS: Assistive device utilized: Hemi walker  Sit to stand: Mod A Stand to sit: Min A Chair to chair:  not tested Floor:  not tested    GAIT: Gait pattern: decreased arm swing- Right, decreased step length- Right, decreased stance time- Right, circumduction- Right, Right hip hike, and poor foot clearance- Right Distance walked: 1 ft Assistive device utilized: Hemi walker Level of assistance: Min A Comments: forward flexed trunk  FUNCTIONAL TESTS:  30 seconds chair stand test x 0; needs mod A for sit to stand  PATIENT SURVEYS:  LEFS 16/80  TODAY'S TREATMENT:                                                                                                                              DATE:  07/29/22 Sit to stand mod assist Ambulation with HW to/from mat 4-5 steps mod assist Sit to supine max assist/ supine to sit total assist Unable to roll to either side Supine:  bridge 10X  Clams 10X AAROM RT, AROM Lt  Heelslides Rt 2X5  AAROM Unable to scoot bottom up/down/Lt Rt with bed mobility Seated LAQ 10X each  DF/PF unable today due to fatique at EOS   07/24/2022 physical therapy evaluation and HEP instruction    PATIENT EDUCATION: Education details: Patient educated on exam findings, POC, scope of PT, HEP, and what to expect next visit. Person educated: Patient Education method: Explanation, Demonstration, and Handouts Education comprehension: verbalized understanding, returned demonstration, verbal cues required, and tactile cues required   HOME EXERCISE PROGRAM: Access Code: [email or web address] URL: [email or web address] Date: 07/24/2022 Prepared by: [facility] - Rehab Exercises - Sit to Stand with Counter Support  - 2 x daily - 7 x weekly - 1 sets - 5 reps  Date: 07/29/2022 Prepared by: Emeline Gins Exercises - Seated Heel Raise  - 2 x daily - 7 x weekly - 2 sets - 5 reps - Seated Heel Toe  Raises  - 2 x daily - 7 x weekly - 23 sets - 5 reps - Seated Hip Flexion  - 2 x daily - 7 x weekly - 2 sets - 5 reps - Seated Long Arc Quad  - 2 x daily - 7 x weekly - 2 sets - 5 reps - Seated Knee Flexion Extension AROM   - 2 x daily - 7 x weekly - 2 sets - 5 reps  GOALS: Goals reviewed with patient? No  SHORT TERM GOALS: Target date: 08/07/2022  patient will be independent with initial HEP Baseline: Goal status: INITIAL  2.  Patient will self report 30% improvement to improve tolerance for functional activity  Baseline:  Goal status: INITIAL   LONG TERM GOALS: Target date: 08/21/2022  Patient will be independent in self management strategies to improve quality of life and functional outcomes.  Baseline:  Goal status: INITIAL  2.  Patient will self report 50% improvement to improve tolerance for functional activity  Baseline:  Goal status: INITIAL  3.  Patient will perform sit to stand from [facility] modified independently to hemiwalker to increase independence at home Baseline: sit to stand from [facility] with  mod A Goal status: INITIAL  4.  Patient will ambulate x 50 ft with hemiwalker and SBA to increase independence with household ambulation Baseline:  Goal status: INITIAL  5.  Patient will increase LEFS score by 5 points to demonstrate improved perceived functional mobiity Baseline: 16/80 Goal status: INITIAL  ASSESSMENT:  CLINICAL IMPRESSION: Pt returns today in wheelchair stating he has not completed any exercises at home, mostly sitting in his lift chair.  Pt requires mod-max assist to stand, completing using momentum.  Pt with Rt side forward flexed with inability to stand erect.  Uses hip to circumduct Rt LE around with ambulation using hemi walker.  PT able to take 4-5 steps max before fatigue.  Pt requires max assist with bed mobility sit to supine and total assist supine to sit.  Unable to roll to either side and requires mo-max assist with all exercises.  MMT completed and recorded above.  Unable to test hip extension properly as unable to get into sidelying or prone.  Hip abduction tested in supine.  Pt given seated exercises to complete and encouraged discontinued use of lift option on chair.  Discussed all this with wife and daughter. Patient will benefit from skilled physical therapy services to address deficits and improve level of function with ADLs, functional mobility tasks, and reduce risk for falls.    OBJECTIVE IMPAIRMENTS: Abnormal gait, decreased activity tolerance, decreased balance, decreased coordination, decreased endurance, decreased mobility, difficulty walking, decreased ROM, decreased strength, decreased safety awareness, hypomobility, impaired perceived functional ability, increased muscle spasms, impaired flexibility, and obesity.   ACTIVITY LIMITATIONS: carrying, lifting, bending, sitting, standing, squatting, stairs, transfers, bed mobility, bathing, toileting, dressing, self feeding, reach over head, hygiene/grooming, locomotion level, and caring for  others  PARTICIPATION LIMITATIONS: meal prep, cleaning, laundry, driving, shopping, community activity, and yard work  [facility] POTENTIAL: Good  CLINICAL DECISION MAKING: Evolving/moderate complexity  EVALUATION COMPLEXITY: Moderate  PLAN:  PT FREQUENCY: 2x/week  PT DURATION: 4 weeks  PLANNED INTERVENTIONS: Therapeutic exercises, Therapeutic activity, Neuromuscular re-education, Balance training, Gait training, Patient/Family education, Joint manipulation, Joint mobilization, Stair training, Orthotic/Fit training, DME instructions, Aquatic Therapy, Dry Needling, Electrical stimulation, Spinal manipulation, Spinal mobilization, Cryotherapy, Moist heat, Compression bandaging, scar mobilization, Splintting, Taping, Traction, Ultrasound, Ionotophoresis 4mg /ml Dexamethasone, and Manual therapy   PLAN FOR NEXT SESSION: PT  only seeing PT (OT did not pick up). Continue to work on transfers, therex, sit to stand transfer,gait training as able.     4:58 PM, 07/29/22  Lurena Nida, PTA/CLT [facility] Ph: [phone]

## 2022-07-31 ENCOUNTER — Ambulatory Visit (HOSPITAL_COMMUNITY): Payer: 59

## 2022-08-05 ENCOUNTER — Ambulatory Visit (HOSPITAL_COMMUNITY): Payer: 59

## 2022-08-14 ENCOUNTER — Telehealth (HOSPITAL_COMMUNITY): Payer: Self-pay

## 2022-08-14 ENCOUNTER — Ambulatory Visit (HOSPITAL_COMMUNITY): Payer: 59

## 2022-08-14 NOTE — Telephone Encounter (Signed)
No show, called and spoke to pt who stated he had a stomach ache. Reminded next apt date and time. Therapist requested pt to call and reschedule/cancel future apts if unable to make it.   Becky Sax, LPTA/CLT; [place] [phone]

## 2022-08-16 ENCOUNTER — Telehealth (HOSPITAL_COMMUNITY): Payer: Self-pay

## 2022-08-16 ENCOUNTER — Ambulatory Visit (HOSPITAL_COMMUNITY): Payer: 59

## 2022-08-16 NOTE — Telephone Encounter (Signed)
No show #2, Psychiatric nurse spoke to wife who stated pt with another apt. Reminded next apt date and time and reviewed no show policy. Cancelled all remaining apts except for next apt per policy.   Becky Sax, LPTA/CLT; [place] [phone]

## 2022-08-20 ENCOUNTER — Telehealth (HOSPITAL_COMMUNITY): Payer: Self-pay

## 2022-08-20 ENCOUNTER — Ambulatory Visit (HOSPITAL_COMMUNITY): Payer: 59

## 2022-08-20 DIAGNOSIS — Z993 Dependence on wheelchair: Secondary | ICD-10-CM | POA: Diagnosis not present

## 2022-08-20 DIAGNOSIS — G8191 Hemiplegia, unspecified affecting right dominant side: Secondary | ICD-10-CM | POA: Diagnosis not present

## 2022-08-20 DIAGNOSIS — N1831 Chronic kidney disease, stage 3a: Secondary | ICD-10-CM | POA: Diagnosis not present

## 2022-08-20 DIAGNOSIS — K5900 Constipation, unspecified: Secondary | ICD-10-CM | POA: Diagnosis not present

## 2022-08-20 DIAGNOSIS — I2510 Atherosclerotic heart disease of native coronary artery without angina pectoris: Secondary | ICD-10-CM | POA: Diagnosis not present

## 2022-08-20 NOTE — Telephone Encounter (Signed)
No show #3, called and spoke to pt and his wife. Reviewed no show policy and is going to discharged at this tiime. All remaining apts are cancelled.   Becky Sax, LPTA/CLT; [place] [phone]

## 2022-08-28 ENCOUNTER — Ambulatory Visit (HOSPITAL_COMMUNITY): Payer: 59

## 2022-09-23 DIAGNOSIS — N1831 Chronic kidney disease, stage 3a: Secondary | ICD-10-CM | POA: Diagnosis not present

## 2022-09-23 DIAGNOSIS — I10 Essential (primary) hypertension: Secondary | ICD-10-CM | POA: Diagnosis not present

## 2022-09-24 ENCOUNTER — Encounter: Payer: 59 | Attending: Registered Nurse | Admitting: Physical Medicine & Rehabilitation

## 2022-09-24 DIAGNOSIS — G8111 Spastic hemiplegia affecting right dominant side: Secondary | ICD-10-CM | POA: Insufficient documentation

## 2022-10-24 DIAGNOSIS — I10 Essential (primary) hypertension: Secondary | ICD-10-CM | POA: Diagnosis not present

## 2022-10-24 DIAGNOSIS — N1831 Chronic kidney disease, stage 3a: Secondary | ICD-10-CM | POA: Diagnosis not present

## 2022-11-24 DIAGNOSIS — I10 Essential (primary) hypertension: Secondary | ICD-10-CM | POA: Diagnosis not present

## 2022-11-24 DIAGNOSIS — N1831 Chronic kidney disease, stage 3a: Secondary | ICD-10-CM | POA: Diagnosis not present

## 2022-12-03 ENCOUNTER — Other Ambulatory Visit: Payer: Self-pay | Admitting: *Deleted

## 2022-12-03 DIAGNOSIS — Z87891 Personal history of nicotine dependence: Secondary | ICD-10-CM

## 2022-12-03 DIAGNOSIS — Z122 Encounter for screening for malignant neoplasm of respiratory organs: Secondary | ICD-10-CM

## 2022-12-06 ENCOUNTER — Ambulatory Visit: Payer: 59 | Admitting: Acute Care

## 2022-12-06 DIAGNOSIS — Z87891 Personal history of nicotine dependence: Secondary | ICD-10-CM

## 2022-12-06 NOTE — Patient Instructions (Signed)

## 2022-12-06 NOTE — Progress Notes (Addendum)
,  Provider Attestation I agree with the documentation of the Shared Decision Making visit,  smoking cessation counseling if appropriate, and verification or eligibility for lung cancer screening as documented by the RN Nurse Navigator.   Lauraine PHEBE Lites, MSN, AGACNP-BC [facility] See Amion for personal pager PCCM on call pager [phone]    Virtual Visit via Telephone Note  I connected with Craig Knight on 12/06/22 at 11:00 AM EDT by telephone and verified that I am speaking with the correct person using two identifiers.  Location: Patient: Craig Knight Provider: Laneta Speaks, RN   I discussed the limitations, risks, security and privacy concerns of performing an evaluation and management service by telephone and the availability of in person appointments. I also discussed with the patient that there may be a patient responsible charge related to this service. The patient expressed understanding and agreed to proceed.   Shared Decision Making Visit Lung Cancer Screening Program [phone])   Eligibility: Age 57 y.o. Pack Years Smoking History Calculation 21 (# packs/per year x # years smoked) Recent History of coughing up blood  no Unexplained weight loss? no ( >Than 15 pounds within the last 6 months ) Prior History Lung / other cancer no (Diagnosis within the last 5 years already requiring surveillance chest CT Scans). Smoking Status Former Smoker Former Smokers: Years since quit: 3 years  Quit Date: 02/26/2019  Visit Components: Discussion included one or more decision making aids. yes Discussion included risk/benefits of screening. yes Discussion included potential follow up diagnostic testing for abnormal scans. yes Discussion included meaning and risk of over diagnosis. yes Discussion included meaning and risk of False Positives. yes Discussion included meaning of total radiation exposure. yes  Counseling Included: Importance of  adherence to annual lung cancer LDCT screening. yes Impact of comorbidities on ability to participate in the program. yes Ability and willingness to under diagnostic treatment. yes  Smoking Cessation Counseling:  Former Smokers:  Discussed the importance of maintaining cigarette abstinence. yes Diagnosis Code: Personal History of Nicotine  Dependence. S12.108 Information about tobacco cessation classes and interventions provided to patient. Yes Written Order for Lung Cancer Screening with LDCT placed in Epic. Yes (CT Chest Lung Cancer Screening Low Dose W/O CM) [id number] Z12.2-Screening of respiratory organs Z87.891-Personal history of nicotine  dependence   Laneta Speaks, RN

## 2022-12-08 ENCOUNTER — Ambulatory Visit (HOSPITAL_COMMUNITY)
Admission: RE | Admit: 2022-12-08 | Discharge: 2022-12-08 | Disposition: A | Discharge status: Home / Self Care | Payer: 59 | Source: Ambulatory Visit | Attending: Acute Care | Admitting: Acute Care

## 2022-12-08 DIAGNOSIS — Z122 Encounter for screening for malignant neoplasm of respiratory organs: Secondary | ICD-10-CM | POA: Diagnosis present

## 2022-12-08 DIAGNOSIS — Z87891 Personal history of nicotine dependence: Secondary | ICD-10-CM | POA: Diagnosis present

## 2022-12-20 ENCOUNTER — Other Ambulatory Visit: Payer: Self-pay | Admitting: Acute Care

## 2022-12-20 DIAGNOSIS — Z122 Encounter for screening for malignant neoplasm of respiratory organs: Secondary | ICD-10-CM

## 2022-12-20 DIAGNOSIS — F17210 Nicotine dependence, cigarettes, uncomplicated: Secondary | ICD-10-CM

## 2022-12-20 DIAGNOSIS — Z87891 Personal history of nicotine dependence: Secondary | ICD-10-CM

## 2023-06-25 ENCOUNTER — Encounter (INDEPENDENT_AMBULATORY_CARE_PROVIDER_SITE_OTHER): Payer: Self-pay | Admitting: *Deleted

## 2023-07-18 ENCOUNTER — Telehealth: Payer: Self-pay | Admitting: Gastroenterology

## 2023-07-18 NOTE — Telephone Encounter (Signed)
 Pt's daughter left a message at [facility]. And wants to speak to someone about his upcoming appt.  [phone]

## 2023-07-25 ENCOUNTER — Ambulatory Visit (INDEPENDENT_AMBULATORY_CARE_PROVIDER_SITE_OTHER): Admitting: Gastroenterology

## 2023-07-28 ENCOUNTER — Encounter (INDEPENDENT_AMBULATORY_CARE_PROVIDER_SITE_OTHER): Payer: Self-pay | Admitting: *Deleted

## 2023-09-17 ENCOUNTER — Encounter: Payer: Self-pay | Admitting: Acute Care

## 2023-11-26 NOTE — Progress Notes (Signed)
 Virtual Visit via Telephone Note  I connected with Craig Knight on 11/26/23 at 11:00 AM EDT by telephone and verified that I am speaking with the correct person using two identifiers.  Location: Patient: at home Provider: [address], [place], [place], [address]    I discussed the limitations, risks, security and privacy concerns of performing an evaluation and management service by telephone and the availability of in person appointments. I also discussed with the patient that there may be a patient responsible charge related to this service. The patient expressed understanding and agreed to proceed.
